# Patient Record
Sex: Male | Born: 1955 | Race: White | Hispanic: No | Marital: Married | Smoking: Former smoker
Health system: Southern US, Community
[De-identification: ages and names within clinical notes are randomized; demographics above are authoritative.]

## PROBLEM LIST (undated history)

## (undated) DIAGNOSIS — Z950 Presence of cardiac pacemaker: Secondary | ICD-10-CM

## (undated) DIAGNOSIS — D6851 Activated protein C resistance: Secondary | ICD-10-CM

## (undated) DIAGNOSIS — F329 Major depressive disorder, single episode, unspecified: Secondary | ICD-10-CM

## (undated) DIAGNOSIS — I071 Rheumatic tricuspid insufficiency: Secondary | ICD-10-CM

## (undated) DIAGNOSIS — K219 Gastro-esophageal reflux disease without esophagitis: Secondary | ICD-10-CM

## (undated) DIAGNOSIS — I2510 Atherosclerotic heart disease of native coronary artery without angina pectoris: Secondary | ICD-10-CM

## (undated) DIAGNOSIS — K5900 Constipation, unspecified: Secondary | ICD-10-CM

## (undated) DIAGNOSIS — F32A Depression, unspecified: Secondary | ICD-10-CM

## (undated) DIAGNOSIS — R0600 Dyspnea, unspecified: Secondary | ICD-10-CM

## (undated) DIAGNOSIS — G4730 Sleep apnea, unspecified: Secondary | ICD-10-CM

## (undated) DIAGNOSIS — F419 Anxiety disorder, unspecified: Secondary | ICD-10-CM

## (undated) DIAGNOSIS — I10 Essential (primary) hypertension: Secondary | ICD-10-CM

## (undated) DIAGNOSIS — R911 Solitary pulmonary nodule: Secondary | ICD-10-CM

## (undated) DIAGNOSIS — M4802 Spinal stenosis, cervical region: Secondary | ICD-10-CM

## (undated) DIAGNOSIS — I509 Heart failure, unspecified: Secondary | ICD-10-CM

## (undated) DIAGNOSIS — I739 Peripheral vascular disease, unspecified: Secondary | ICD-10-CM

## (undated) DIAGNOSIS — I499 Cardiac arrhythmia, unspecified: Secondary | ICD-10-CM

## (undated) DIAGNOSIS — I219 Acute myocardial infarction, unspecified: Secondary | ICD-10-CM

## (undated) DIAGNOSIS — J449 Chronic obstructive pulmonary disease, unspecified: Secondary | ICD-10-CM

## (undated) DIAGNOSIS — I340 Nonrheumatic mitral (valve) insufficiency: Secondary | ICD-10-CM

## (undated) DIAGNOSIS — Z95810 Presence of automatic (implantable) cardiac defibrillator: Secondary | ICD-10-CM

## (undated) DIAGNOSIS — J439 Emphysema, unspecified: Secondary | ICD-10-CM

## (undated) DIAGNOSIS — M1990 Unspecified osteoarthritis, unspecified site: Secondary | ICD-10-CM

## (undated) DIAGNOSIS — I4891 Unspecified atrial fibrillation: Secondary | ICD-10-CM

## (undated) DIAGNOSIS — Z9981 Dependence on supplemental oxygen: Secondary | ICD-10-CM

## (undated) HISTORY — PX: CATARACT EXTRACTION, BILATERAL: SHX1313

## (undated) HISTORY — PX: BELOW KNEE LEG AMPUTATION: SUR23

## (undated) HISTORY — PX: TONSILLECTOMY: SUR1361

## (undated) HISTORY — PX: INSERT / REPLACE / REMOVE PACEMAKER: SUR710

## (undated) HISTORY — PX: ABOVE KNEE LEG AMPUTATION: SUR20

---

## 2002-05-25 DIAGNOSIS — I219 Acute myocardial infarction, unspecified: Secondary | ICD-10-CM

## 2002-05-25 HISTORY — DX: Acute myocardial infarction, unspecified: I21.9

## 2003-05-02 ENCOUNTER — Other Ambulatory Visit: Payer: Self-pay

## 2003-05-26 HISTORY — PX: CORONARY ANGIOPLASTY: SHX604

## 2005-10-15 ENCOUNTER — Ambulatory Visit: Payer: Self-pay | Admitting: Gastroenterology

## 2005-10-19 ENCOUNTER — Ambulatory Visit: Payer: Self-pay

## 2006-03-12 ENCOUNTER — Ambulatory Visit: Payer: Self-pay | Admitting: Internal Medicine

## 2006-11-11 ENCOUNTER — Ambulatory Visit: Payer: Self-pay | Admitting: Internal Medicine

## 2009-03-03 ENCOUNTER — Emergency Department: Payer: Self-pay | Admitting: Emergency Medicine

## 2009-10-02 ENCOUNTER — Emergency Department: Payer: Self-pay | Admitting: Emergency Medicine

## 2009-12-25 ENCOUNTER — Ambulatory Visit: Payer: Self-pay | Admitting: Internal Medicine

## 2011-06-12 DIAGNOSIS — Z95810 Presence of automatic (implantable) cardiac defibrillator: Secondary | ICD-10-CM | POA: Insufficient documentation

## 2011-06-12 DIAGNOSIS — D6851 Activated protein C resistance: Secondary | ICD-10-CM | POA: Insufficient documentation

## 2011-06-14 ENCOUNTER — Emergency Department: Payer: Self-pay | Admitting: Emergency Medicine

## 2011-06-14 LAB — COMPREHENSIVE METABOLIC PANEL
Alkaline Phosphatase: 64 U/L (ref 50–136)
Bilirubin,Total: 0.7 mg/dL (ref 0.2–1.0)
Calcium, Total: 9.2 mg/dL (ref 8.5–10.1)
Chloride: 98 mmol/L (ref 98–107)
Co2: 29 mmol/L (ref 21–32)
Creatinine: 0.79 mg/dL (ref 0.60–1.30)
EGFR (African American): >60
EGFR (Non-African Amer.): >60
Glucose: 88 mg/dL (ref 65–99)
SGOT(AST): 25 U/L (ref 15–37)
SGPT (ALT): 26 U/L
Total Protein: 7.8 g/dL (ref 6.4–8.2)

## 2011-06-14 LAB — CBC
HCT: 47.3 % (ref 40.0–52.0)
MCHC: 33.6 g/dL (ref 32.0–36.0)
MCV: 97 fL (ref 80–100)
Platelet: 195 10*3/uL (ref 150–440)
RBC: 4.88 10*6/uL (ref 4.40–5.90)
RDW: 14.4 % (ref 11.5–14.5)
WBC: 7.8 10*3/uL (ref 3.8–10.6)

## 2011-06-14 LAB — TROPONIN I: Troponin-I: <0.02 ng/mL

## 2011-08-13 ENCOUNTER — Ambulatory Visit: Payer: Self-pay | Admitting: Gastroenterology

## 2013-09-25 ENCOUNTER — Ambulatory Visit: Payer: Self-pay | Admitting: Internal Medicine

## 2013-12-23 ENCOUNTER — Ambulatory Visit: Payer: Self-pay | Admitting: Internal Medicine

## 2014-01-04 ENCOUNTER — Ambulatory Visit: Payer: Self-pay | Admitting: Oncology

## 2014-01-12 ENCOUNTER — Ambulatory Visit: Payer: Self-pay | Admitting: Oncology

## 2014-01-12 LAB — COMPREHENSIVE METABOLIC PANEL
ALK PHOS: 70 U/L
ANION GAP: 7 (ref 7–16)
Albumin: 4.2 g/dL (ref 3.4–5.0)
BUN: 7 mg/dL (ref 7–18)
Bilirubin,Total: 0.5 mg/dL (ref 0.2–1.0)
CHLORIDE: 96 mmol/L — AB (ref 98–107)
CO2: 31 mmol/L (ref 21–32)
Calcium, Total: 9.2 mg/dL (ref 8.5–10.1)
Creatinine: 0.92 mg/dL (ref 0.60–1.30)
EGFR (African American): >60
GLUCOSE: 97 mg/dL (ref 65–99)
OSMOLALITY: 266 (ref 275–301)
POTASSIUM: 4 mmol/L (ref 3.5–5.1)
SGOT(AST): 15 U/L (ref 15–37)
SGPT (ALT): 22 U/L
SODIUM: 134 mmol/L — AB (ref 136–145)
TOTAL PROTEIN: 7.3 g/dL (ref 6.4–8.2)

## 2014-01-12 LAB — CBC CANCER CENTER
Basophil #: 0.1 x10 3/mm (ref 0.0–0.1)
Basophil %: 1 %
Eosinophil #: 0.2 x10 3/mm (ref 0.0–0.7)
Eosinophil %: 2.2 %
HCT: 39.1 % — ABNORMAL LOW (ref 40.0–52.0)
HGB: 13.2 g/dL (ref 13.0–18.0)
Lymphocyte #: 1.2 x10 3/mm (ref 1.0–3.6)
Lymphocyte %: 16.8 %
MCH: 31.1 pg (ref 26.0–34.0)
MCHC: 33.7 g/dL (ref 32.0–36.0)
MCV: 92 fL (ref 80–100)
Monocyte #: 0.5 x10 3/mm (ref 0.2–1.0)
Monocyte %: 7.9 %
NEUTROS PCT: 72.1 %
Neutrophil #: 5.1 x10 3/mm (ref 1.4–6.5)
Platelet: 239 x10 3/mm (ref 150–440)
RBC: 4.24 10*6/uL — ABNORMAL LOW (ref 4.40–5.90)
RDW: 14 % (ref 11.5–14.5)
WBC: 7 x10 3/mm (ref 3.8–10.6)

## 2014-01-12 LAB — LACTATE DEHYDROGENASE: LDH: 122 U/L (ref 85–241)

## 2014-01-12 LAB — TSH: Thyroid Stimulating Horm: 0.717 u[IU]/mL

## 2014-01-15 LAB — PSA: PSA: 0.6 ng/mL (ref 0.0–4.0)

## 2014-01-15 LAB — CEA: CEA: 2.4 ng/mL

## 2014-01-23 ENCOUNTER — Ambulatory Visit: Payer: Self-pay | Admitting: Internal Medicine

## 2014-01-23 ENCOUNTER — Ambulatory Visit: Payer: Self-pay | Admitting: Oncology

## 2014-02-22 ENCOUNTER — Ambulatory Visit: Payer: Self-pay | Admitting: Gastroenterology

## 2014-02-27 LAB — PATHOLOGY REPORT

## 2014-07-23 DIAGNOSIS — E782 Mixed hyperlipidemia: Secondary | ICD-10-CM | POA: Insufficient documentation

## 2014-08-01 DIAGNOSIS — I70219 Atherosclerosis of native arteries of extremities with intermittent claudication, unspecified extremity: Secondary | ICD-10-CM | POA: Insufficient documentation

## 2014-08-01 DIAGNOSIS — I071 Rheumatic tricuspid insufficiency: Secondary | ICD-10-CM | POA: Insufficient documentation

## 2014-08-01 DIAGNOSIS — I5022 Chronic systolic (congestive) heart failure: Secondary | ICD-10-CM | POA: Diagnosis present

## 2014-09-24 ENCOUNTER — Other Ambulatory Visit (HOSPITAL_COMMUNITY): Payer: Self-pay | Admitting: Interventional Radiology

## 2014-09-24 ENCOUNTER — Other Ambulatory Visit: Payer: Self-pay | Admitting: Vascular Surgery

## 2014-09-24 ENCOUNTER — Ambulatory Visit
Admission: RE | Admit: 2014-09-24 | Discharge: 2014-09-24 | Disposition: A | Discharge status: Home / Self Care | Payer: 59 | Source: Ambulatory Visit | Attending: Vascular Surgery | Admitting: Vascular Surgery

## 2014-09-24 DIAGNOSIS — Z89611 Acquired absence of right leg above knee: Secondary | ICD-10-CM | POA: Insufficient documentation

## 2014-09-24 DIAGNOSIS — Z87891 Personal history of nicotine dependence: Secondary | ICD-10-CM | POA: Diagnosis not present

## 2014-09-24 DIAGNOSIS — I2510 Atherosclerotic heart disease of native coronary artery without angina pectoris: Secondary | ICD-10-CM | POA: Diagnosis not present

## 2014-09-24 DIAGNOSIS — I499 Cardiac arrhythmia, unspecified: Secondary | ICD-10-CM | POA: Diagnosis not present

## 2014-09-24 DIAGNOSIS — I252 Old myocardial infarction: Secondary | ICD-10-CM | POA: Diagnosis not present

## 2014-09-24 DIAGNOSIS — I70212 Atherosclerosis of native arteries of extremities with intermittent claudication, left leg: Secondary | ICD-10-CM | POA: Insufficient documentation

## 2014-09-24 DIAGNOSIS — Z89619 Acquired absence of unspecified leg above knee: Secondary | ICD-10-CM | POA: Insufficient documentation

## 2014-09-24 DIAGNOSIS — I509 Heart failure, unspecified: Secondary | ICD-10-CM | POA: Insufficient documentation

## 2014-09-24 DIAGNOSIS — Z86718 Personal history of other venous thrombosis and embolism: Secondary | ICD-10-CM | POA: Insufficient documentation

## 2014-09-24 DIAGNOSIS — I10 Essential (primary) hypertension: Secondary | ICD-10-CM | POA: Diagnosis not present

## 2014-09-24 DIAGNOSIS — I70209 Unspecified atherosclerosis of native arteries of extremities, unspecified extremity: Secondary | ICD-10-CM

## 2014-09-24 DIAGNOSIS — Z79899 Other long term (current) drug therapy: Secondary | ICD-10-CM | POA: Diagnosis not present

## 2014-09-24 HISTORY — DX: Presence of cardiac pacemaker: Z95.0

## 2014-09-24 HISTORY — DX: Presence of automatic (implantable) cardiac defibrillator: Z95.810

## 2014-09-24 HISTORY — DX: Acute myocardial infarction, unspecified: I21.9

## 2014-09-24 HISTORY — DX: Essential (primary) hypertension: I10

## 2014-09-24 HISTORY — DX: Chronic obstructive pulmonary disease, unspecified: J44.9

## 2014-09-24 HISTORY — DX: Peripheral vascular disease, unspecified: I73.9

## 2014-09-24 HISTORY — DX: Atherosclerotic heart disease of native coronary artery without angina pectoris: I25.10

## 2014-09-24 LAB — CREATININE, SERUM
Creatinine, Ser: 0.77 mg/dL (ref 0.61–1.24)
GFR calc Af Amer: >60 mL/min (ref >60)
GFR calc non Af Amer: >60 mL/min (ref >60)

## 2014-09-24 LAB — PROTIME-INR
INR: 1.24
Prothrombin Time: 15.8 seconds — ABNORMAL HIGH (ref 11.4–15.0)

## 2014-09-24 LAB — BUN: BUN: 9 mg/dL (ref 6–20)

## 2014-09-24 MED ORDER — CEFAZOLIN SODIUM 1-5 GM-% IV SOLN
1.0000 g | Freq: Once | INTRAVENOUS | Status: AC
  Administered 2014-09-24: 1 g via INTRAVENOUS
  Filled 2014-09-24: qty 50

## 2014-09-24 MED ORDER — IOHEXOL 300 MG/ML  SOLN
INTRAMUSCULAR | Status: DC | PRN
  Administered 2014-09-24: 30 mL via INTRA_ARTERIAL

## 2014-09-24 MED ORDER — HEPARIN SODIUM (PORCINE) 1000 UNIT/ML IJ SOLN
INTRAMUSCULAR | Status: AC
Start: 2014-09-24 — End: 2014-09-24
  Filled 2014-09-24: qty 1

## 2014-09-24 MED ORDER — HYDROMORPHONE HCL 1 MG/ML IJ SOLN: 1.0000 mg | Freq: Once | INTRAMUSCULAR | Status: AC | PRN

## 2014-09-24 MED ORDER — HEPARIN SODIUM (PORCINE) 1000 UNIT/ML IJ SOLN
INTRAMUSCULAR | Status: DC | PRN
  Administered 2014-09-24: 4000 [IU] via INTRAVENOUS

## 2014-09-24 MED ORDER — FENTANYL CITRATE (PF) 100 MCG/2ML IJ SOLN
INTRAMUSCULAR | Status: AC
  Filled 2014-09-24: qty 2

## 2014-09-24 MED ORDER — ONDANSETRON HCL 4 MG/2ML IJ SOLN
4.0000 mg | Freq: Once | INTRAMUSCULAR | Status: AC | PRN
Start: 2014-09-24 — End: 2014-09-24
  Filled 2014-09-24: qty 2

## 2014-09-24 MED ORDER — IOHEXOL 300 MG/ML  SOLN
INTRAMUSCULAR | Status: DC | PRN
  Administered 2014-09-24: 75 mL via INTRA_ARTERIAL

## 2014-09-24 MED ORDER — CEFAZOLIN SODIUM 1-5 GM-% IV SOLN
INTRAVENOUS | Status: AC
Start: 2014-09-24 — End: 2014-09-24
  Filled 2014-09-24: qty 50

## 2014-09-24 MED ORDER — IOHEXOL 300 MG/ML  SOLN: INTRAMUSCULAR | Status: DC | PRN

## 2014-09-24 MED ORDER — SODIUM CHLORIDE 0.9 % IV SOLN: INTRAVENOUS | Status: DC

## 2014-09-24 MED ORDER — ACETAMINOPHEN 500 MG PO TABS
ORAL_TABLET | ORAL | Status: AC
  Filled 2014-09-24: qty 2

## 2014-09-24 MED ORDER — ATROPINE SULFATE 0.1 MG/ML IJ SOLN
1.0000 mg | Freq: Once | INTRAMUSCULAR | Status: DC
  Filled 2014-09-24: qty 10

## 2014-09-24 MED ORDER — LIDOCAINE-EPINEPHRINE (PF) 1 %-1:200000 IJ SOLN
INTRAMUSCULAR | Status: AC
  Filled 2014-09-24: qty 30

## 2014-09-24 MED ORDER — HEPARIN (PORCINE) IN NACL 2-0.9 UNIT/ML-% IJ SOLN
INTRAMUSCULAR | Status: AC
  Filled 2014-09-24: qty 1000

## 2014-09-24 MED ORDER — MIDAZOLAM HCL 2 MG/2ML IJ SOLN
INTRAMUSCULAR | Status: DC | PRN
Start: 2014-09-24 — End: 2014-09-24
  Administered 2014-09-24 (×2): 1 mg via INTRAVENOUS
  Administered 2014-09-24: 2 mg via INTRAVENOUS
  Administered 2014-09-24: 1 mg via INTRAVENOUS

## 2014-09-24 MED ORDER — MIDAZOLAM HCL 5 MG/5ML IJ SOLN
INTRAMUSCULAR | Status: AC
  Filled 2014-09-24: qty 5

## 2014-09-24 MED ORDER — SODIUM CHLORIDE 0.9 % IV SOLN
INTRAVENOUS | Status: DC
  Administered 2014-09-24: 12:00:00 via INTRAVENOUS

## 2014-09-24 NOTE — Discharge Instructions (Signed)
Angiogram, Care After Refer to this sheet in the next few weeks. These instructions provide you with information on caring for yourself after your procedure. Your health care provider may also give you more specific instructions. Your treatment has been planned according to current medical practices, but problems sometimes occur. Call your health care provider if you have any problems or questions after your procedure.  WHAT TO EXPECT AFTER THE PROCEDURE After your procedure, it is typical to have the following sensations:  Minor discomfort or tenderness and a small bump at the catheter insertion site. The bump should usually decrease in size and tenderness within 1 to 2 weeks.  Any bruising will usually fade within 2 to 4 weeks. HOME CARE INSTRUCTIONS   You may need to keep taking blood thinners if they were prescribed for you. Take medicines only as directed by your health care provider.  Do not apply powder or lotion to the site.  Do not take baths, swim, or use a hot tub until your health care provider approves.  You may shower 24 hours after the procedure. Remove the bandage (dressing) and gently wash the site with plain soap and water. Gently pat the site dry.  Inspect the site at least twice daily.  Limit your activity for the first 48 hours. Do not bend, squat, or lift anything over 20 lb (9 kg) or as directed by your health care provider.  Plan to have someone take you home after the procedure. Follow instructions about when you can drive or return to work. SEEK MEDICAL CARE IF:  You get light-headed when standing up.  You have drainage (other than a small amount of blood on the dressing).  You have chills.  You have a fever.  You have redness, warmth, swelling, or pain at the insertion site. SEEK IMMEDIATE MEDICAL CARE IF:   You develop chest pain or shortness of breath, feel faint, or pass out.  You have bleeding, swelling larger than a walnut, or drainage from the  catheter insertion site.  You develop pain, discoloration, coldness, or severe bruising in the leg or arm that held the catheter.  You develop bleeding from any other place, such as the bowels. You may see bright red blood in your urine or stools, or your stools may appear black and tarry.  You have heavy bleeding from the site. If this happens, hold pressure on the site. MAKE SURE YOU:  Understand these instructions.  Will watch your condition.  Will get help right away if you are not doing well or get worse. Document Released: 11/27/2004 Document Revised: 09/25/2013 Document Reviewed: 10/03/2012 [facility] Patient Information 2015 [place], [place]. This information is not intended to replace advice given to you by your health care provider. Make sure you discuss any questions you have with your health care provider. Groin Insertion Instructions-If you lose feeling or develop tingling or pain in your leg or foot after the procedure, please walk around first.  If the discomfort does not improve , contact your physician and proceed to the nearest emergency room.  Loss of feeling in your leg might mean that a blockage has formed in the artery and this can be appropriately treated.  Limit your activity for the next two days after your procedure.  Avoid stooping, bending, heavy lifting or exertion as this may put pressure on the insertion site.  Resume normal activities in 48 hours.  You may shower after 24 hours but avoid excessive warm water and do not scrub  the site.  Remove clear dressing in 48 hours.  If you have had a closure device inserted, do not soak in a tub bath or a hot tub for at least one week.  No driving for 48 hours after discharge.  After the procedure, check the insertion site occasionally.  If any oozing occurs or there is apparent swelling, firm pressure over the site will prevent a bruise from forming.  You can not hurt anything by pressing directly on the site.  The pressure stops  the bleeding by allowing a small clot to form.  If the bleeding continues after the pressure has been applied for more than 15 minutes, call 911 or go to the nearest emergency room.    The x-ray dye causes you to pass a considerate amount of urine.  For this reason, you will be asked to drink plenty of liquids after the procedure to prevent dehydration.  You may resume you regular diet.  Avoid caffeine products.    For pain at the site of your procedure, take non-aspirin medicines such as Tylenol.  Medications: A. Hold Metformin for 48 hours if applicable.  B. Continue taking all your present medications at home unless your doctor prescribes any changes.

## 2014-09-24 NOTE — Op Note (Signed)
NAME:  AZEEZ, Tyler [name] NO.:  [id number]  MEDICAL RECORD NO.:  [id number]  LOCATION:  I                            FACILITY:  [facility]  PHYSICIAN:  Algernon Huxley, MD        DATE OF BIRTH:  06/04/1955  DATE OF PROCEDURE:  09/24/2014 DATE OF DISCHARGE:                              OPERATIVE REPORT   PREOPERATIVE DIAGNOSIS: 1. Peripheral arterial disease with claudication, left lower     extremity. 2. 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 3. Hypertension. 4. Cardiac arrhythmias.  POSTOPERATIVE DIAGNOSIS: 1. Peripheral arterial disease with claudication, left lower     extremity. 2. 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 3. Hypertension. 4. Cardiac arrhythmias.  PROCEDURE PERFORMED: 1. Ultrasound-guidance for vascular access to right common femoral     artery. 2. Catheter placement to left popliteal artery from right femoral     approach. 3. Aortogram and selective left lower extremity angiogram. 4. Percutaneous transluminal angioplasty of right external and common     iliac arteries, with 5 mm diameter Lutonix drug-coated angioplasty     balloon. 5. Percutaneous transluminal angioplasty of left common iliac artery     with a 6 mm diameter angioplasty balloon. 6. Percutaneous transluminal angioplasty of left superficial femoral     artery with 4 mm diameter conventional and 5 mm diameter drug-     coated angioplasty balloons. 7. Self-expanding stent placement to the left superficial femoral     artery, with a 6 mm diameter x 8 cm length self-expanding stent,     for greater than 50% residual stenosis after angioplasty.  SURGEON:  Algernon Huxley, MD  ANESTHESIA:  Local with monitored conscious sedation.  ESTIMATED BLOOD LOSS:  Minimal.  INDICATIONS FOR PROCEDURE:  This is a gentleman with severe peripheral vascular disease.  He has stopped smoking within the past year  or so. He has severe pain in his left foot with minimal activity and is beginning to get the signs of ischemic rest pain.  He has already lost his right leg due to severe peripheral vascular disease.  He is brought in for angiography after noninvasive studies confirmed significant reduction in flow.  Risks and benefits were discussed.  Informed consent was obtained.  DESCRIPTION OF PROCEDURE:  The patient was brought to the vascular suite.  Groins were shaved and prepped and a sterile surgical field was created.  The right femoral artery was visualized and found to be occluded at the femoral head.  It was accessed at the top of the femoral head, under ultrasound guidance without difficulty, with a micropuncture needle.  A micropuncture wire and sheath were then placed and imaging through the micropuncture sheath showed severe stenosis in the right external iliac artery and distal common iliac artery, with multiple areas of greater than 80% stenosis over a 6-8 cm segment.  Up-sized to a 5-French sheath.  A pigtail catheter was placed in the aorta at the L1 level and an AP aortogram was performed.  This showed normal renal arteries bilaterally.  The left common iliac artery had about a  70% stenosis.  The pigtail catheter was then used to cross the aortic bifurcation.  However, it did not track easily due to the significant stenosis.  We eventually exchanged for a Navicross and a CXI catheter. Selective left lower extremity angiogram was performed.  This demonstrated the above-mentioned iliac stenosis.  The common femoral bifurcation had about a 70%-80% stenosis in both the profunda femoris artery and superficial femoral artery at their origins.  The superficial femoral artery then normalized until the mid to distal superficial femoral artery, where there was some diffuse stenosis leading to a short- segment occlusion at Hunter's canal.  The vessel reconstituted in the popliteal artery and  he then had 2-vessel run-off to the foot, with the posterior tibial artery being dominant.  I was able to navigate through the SFA stenosis proximally and the SFA occlusion distally and confirmed intraluminal flow with the CXI catheter and the popliteal artery.  The patient was given 4000 units of intravenous heparin for systemic anticoagulation.  A 6-French __________ sheath would not originally pass through the right iliac stenosis, so we ballooned the right iliac lesion with a 5 mm diameter x 8 cm length Lutonix drug-coated angioplasty balloon.  A tight waist was seen, which resolved with angioplasty. Completion angiogram following this showed some moderate residual stenosis, but now the 6-French sheath would pass.  The left common iliac lesion was then treated with a 6 mm diameter x 4 cm length Lutonix drug- coated angioplasty balloon, with a good angiographic completion result and only about a 10%-15% residual stenosis.  I then turned my attention to the SFA lesion.  The lesion was quite tight and I did not expect the drug-coated balloon to cross this initially.  I predilated the lesion with a 4 mm diameter angioplasty balloon with a high-grade residual stenosis and near-occlusion seen.  I then upsized to the appropriate size of the artery and used a 5 mm diameter x 12 cm length Lutonix drug-coated angioplasty balloon in the distal SFA down to Hunter's canal.  A tight waist was seen, which resolved with angioplasty.  However, after angioplasty, there was still a high-grade residual stenosis, greater than 70%, and so I elected to cover this area with a self-expanding stent and a 6 mm diameter x 8 cm length self-expanding stent was deployed in the distal SFA, post-dilated with a 5 mm balloon, with an excellent angiographic completion result and less than 10% residual stenosis.  At this point, I elected to terminate the procedure.  I elected not to treat the femoral bifurcation lesion  due to concern for damaging the profunda femoris artery, and this would be in an area where a stent would be contraindicated in a patient who was at acceptable risk for femoral endarterectomy.  If the symptoms persist after treating his iliac disease and his distal SFA disease, open surgery would be recommended to treat his femoral bifurcation lesion.  The sheath was removed.  Pressure was held.  Sterile dressing was placed.  The patient tolerated the procedure well and was taken to the recovery room in stable condition.          ______________________________ Algernon Huxley, MD     JSD/MEDQ  D:  09/24/2014  T:  09/24/2014  Job:  [id number]

## 2015-05-20 ENCOUNTER — Emergency Department: Payer: 59

## 2015-05-20 ENCOUNTER — Emergency Department
Admission: EM | Admit: 2015-05-20 | Discharge: 2015-05-20 | Disposition: A | Discharge status: Home / Self Care | Payer: 59 | Attending: Emergency Medicine | Admitting: Emergency Medicine

## 2015-05-20 DIAGNOSIS — Z87891 Personal history of nicotine dependence: Secondary | ICD-10-CM | POA: Insufficient documentation

## 2015-05-20 DIAGNOSIS — L299 Pruritus, unspecified: Secondary | ICD-10-CM | POA: Diagnosis not present

## 2015-05-20 DIAGNOSIS — R21 Rash and other nonspecific skin eruption: Secondary | ICD-10-CM | POA: Diagnosis not present

## 2015-05-20 DIAGNOSIS — Z79899 Other long term (current) drug therapy: Secondary | ICD-10-CM | POA: Insufficient documentation

## 2015-05-20 DIAGNOSIS — J441 Chronic obstructive pulmonary disease with (acute) exacerbation: Secondary | ICD-10-CM | POA: Insufficient documentation

## 2015-05-20 DIAGNOSIS — Z950 Presence of cardiac pacemaker: Secondary | ICD-10-CM | POA: Insufficient documentation

## 2015-05-20 DIAGNOSIS — Z7901 Long term (current) use of anticoagulants: Secondary | ICD-10-CM | POA: Insufficient documentation

## 2015-05-20 DIAGNOSIS — R0602 Shortness of breath: Secondary | ICD-10-CM | POA: Diagnosis present

## 2015-05-20 DIAGNOSIS — I10 Essential (primary) hypertension: Secondary | ICD-10-CM | POA: Insufficient documentation

## 2015-05-20 LAB — TROPONIN I: Troponin I: <0.03 ng/mL (ref <0.031)

## 2015-05-20 LAB — BASIC METABOLIC PANEL
ANION GAP: 8 (ref 5–15)
BUN: 11 mg/dL (ref 6–20)
CO2: 28 mmol/L (ref 22–32)
Calcium: 9.8 mg/dL (ref 8.9–10.3)
Chloride: 98 mmol/L — ABNORMAL LOW (ref 101–111)
Creatinine, Ser: 0.86 mg/dL (ref 0.61–1.24)
GFR calc Af Amer: >60 mL/min (ref >60)
GLUCOSE: 115 mg/dL — AB (ref 65–99)
POTASSIUM: 4 mmol/L (ref 3.5–5.1)
Sodium: 134 mmol/L — ABNORMAL LOW (ref 135–145)

## 2015-05-20 LAB — CBC
HEMATOCRIT: 41.9 % (ref 40.0–52.0)
HEMOGLOBIN: 14.1 g/dL (ref 13.0–18.0)
MCH: 29.7 pg (ref 26.0–34.0)
MCHC: 33.7 g/dL (ref 32.0–36.0)
MCV: 88.2 fL (ref 80.0–100.0)
Platelets: 216 10*3/uL (ref 150–440)
RBC: 4.75 MIL/uL (ref 4.40–5.90)
RDW: 15.2 % — ABNORMAL HIGH (ref 11.5–14.5)
WBC: 7.7 10*3/uL (ref 3.8–10.6)

## 2015-05-20 MED ORDER — ALBUTEROL SULFATE (2.5 MG/3ML) 0.083% IN NEBU
5.0000 mg | INHALATION_SOLUTION | Freq: Once | RESPIRATORY_TRACT | Status: AC
  Administered 2015-05-20: 5 mg via RESPIRATORY_TRACT
  Filled 2015-05-20: qty 6

## 2015-05-20 MED ORDER — PREDNISONE 20 MG PO TABS: 60.0000 mg | ORAL_TABLET | Freq: Every day | ORAL | Status: DC

## 2015-05-20 MED ORDER — IPRATROPIUM-ALBUTEROL 0.5-2.5 (3) MG/3ML IN SOLN
9.0000 mL | Freq: Once | RESPIRATORY_TRACT | Status: AC
  Administered 2015-05-20: 9 mL via RESPIRATORY_TRACT
  Filled 2015-05-20: qty 9

## 2015-05-20 MED ORDER — PREDNISONE 20 MG PO TABS
60.0000 mg | ORAL_TABLET | ORAL | Status: AC
  Administered 2015-05-20: 60 mg via ORAL
  Filled 2015-05-20: qty 3

## 2015-05-20 NOTE — ED Notes (Signed)
Pt with pacemaker/defib. Pt also reports rash to chest for the past 5-6 days.

## 2015-05-20 NOTE — ED Notes (Signed)
Pt reports hx of COPD and started feeling like he could not breath 4 days ago. Pt reports shortness of breath has gotten worse each day. Pt denies pain, reports he just feels like he can not get a good breath.

## 2015-05-20 NOTE — ED Provider Notes (Signed)
[facility] Emergency Department Provider Note  ____________________________________________  Time seen: Approximately 4:02 PM  I have reviewed the triage vital signs and the nursing notes.   HISTORY  Chief Complaint Shortness of Breath    HPI Tyler [name] is a 59 y.o. male with a history which includes stage IV COPD who follows with Dr. Raul Del, peripheral artery disease status post right AKA, biventricular pacemaker, and anticoagulation on warfarin who presents with gradual onset 4-5 days of worsening shortness of breath.  He states that he has been taking his maintenance COPD medications but that his pulmonologist advise him that he should only use his nebulizer if absolutely needed, so he has not used it over the last 5 days.  He reports that he feels like he can not catch his breath and that any amount of exertion makes it significantly worse.  He describes the symptoms as severe.  He is currently getting an albuterol treatment in the emergency department and reports some improvement since the treatment.  He denies headache, fever/chills, chest pain, abdominal pain, nausea/vomiting.  He has not noticed any peripheral edema and his left leg.  He states that he has had COPD exacerbations in the past and this feels kind of like that.  He has made his symptoms better and exertion and exercise makes it worse.  He is also complaining of a pruritic rash on his chest, his back, and his arms which all started about 5-6 days ago.  Actually he states that the rash in the arms has been there "a long time" and was thought to be due to trying Eliquis by his primary care doctor, then Xarelto, then when the rash did not resolve he was put back on warfarin.  The rashes been persistent but the itching red rash on his chest and back are new.  He states that it is not painful, just a mild degree of itchiness.   Past Medical History  Diagnosis Date  . Coronary artery disease   .  Myocardial infarction ([place])   . Hypertension   . AICD (automatic cardioverter/defibrillator) present   . Peripheral vascular disease   . Presence of permanent cardiac pacemaker   . COPD (chronic obstructive pulmonary disease)     There are no active problems to display for this patient.   Past Surgical History  Procedure Laterality Date  . Below knee leg amputation Left   . Insert / replace / remove pacemaker      Current Outpatient Rx  Name  Route  Sig  Dispense  Refill  . albuterol (PROVENTIL HFA;VENTOLIN HFA) 108 (90 BASE) MCG/ACT inhaler   Inhalation   Inhale 2 puffs into the lungs.         Marland Kitchen albuterol-ipratropium (COMBIVENT) 18-103 MCG/ACT inhaler   Inhalation   Inhale 1 puff into the lungs every 4 (four) hours.         . ALPRAZolam (XANAX) 1 MG tablet   Oral   Take 1 mg by mouth at bedtime.         Marland Kitchen amLODipine (NORVASC) 10 MG tablet   Oral   Take 10 mg by mouth daily.         . calcium carbonate (TUMS - DOSED IN MG ELEMENTAL CALCIUM) 500 MG chewable tablet      1 tablet daily. prn         . clotrimazole-betamethasone (LOTRISONE) cream   Topical   Apply 1 application topically 2 (two) times daily.         Marland Kitchen  docusate sodium (COLACE) 100 MG capsule   Oral   Take 100 mg by mouth every other day.         . levalbuterol (XOPENEX) 1.25 MG/3ML nebulizer solution   Nebulization   Take 3 mLs by nebulization every 6 (six) hours as needed for wheezing.         . lovastatin (MEVACOR) 20 MG tablet   Oral   Take 20 mg by mouth at bedtime.         . metoprolol succinate (TOPROL-XL) 100 MG 24 hr tablet   Oral   Take 100 mg by mouth daily. Take with or immediately following a meal.         . omeprazole (PRILOSEC) 20 MG capsule   Oral   Take 20 mg by mouth daily.         . predniSONE (DELTASONE) 20 MG tablet   Oral   Take 3 tablets (60 mg total) by mouth daily.   15 tablet   0   . ramipril (ALTACE) 10 MG capsule   Oral   Take 10 mg by  mouth daily.         Marland Kitchen warfarin (COUMADIN) 6 MG tablet   Oral   Take 6 mg by mouth daily.           Allergies Review of patient's allergies indicates no known allergies.  Family History  Problem Relation Age of Onset  . Cancer Mother   . Cancer Father   . Heart disease Father     Social History Social History  Substance Use Topics  . Smoking status: Former Smoker -- 1.00 packs/day for 42 years    Types: Cigarettes    Quit date: 09/23/2013  . Smokeless tobacco: Not on file  . Alcohol Use: No    Review of Systems Constitutional: No fever/chills Eyes: No visual changes. ENT: No sore throat. Cardiovascular: Denies chest pain. Respiratory: Increasing shortness of breath over 5 days particularly with exertion Gastrointestinal: No abdominal pain.  No nausea, no vomiting.  No diarrhea.  No constipation. Genitourinary: Negative for dysuria. Musculoskeletal: Negative for back pain. Skin: Pruritic rash on his chest and back over the last 4-5 days, chronic rash on his arms particularly the left one Neurological: Negative for headaches, focal weakness or numbness.  10-point ROS otherwise negative.  ____________________________________________   PHYSICAL EXAM:  VITAL SIGNS: ED Triage Vitals  Enc Vitals Group     BP 05/20/15 1426 152/76 mmHg     Pulse Rate 05/20/15 1426 78     Resp 05/20/15 1426 20     Temp 05/20/15 1426 97.5 F (36.4 C)     Temp Source 05/20/15 1426 Oral     SpO2 05/20/15 1426 98 %     Weight 05/20/15 1426 154 lb (69.854 kg)     Height 05/20/15 1426 6\' 2"  (1.88 m)     Head Cir --      Peak Flow --      Pain Score --      Pain Loc --      Pain Edu? --      Excl. in [place]? --     Constitutional: Alert and oriented.  No acute distress, has the appearance of chronic illness.  Mildly cachectic. Eyes: Conjunctivae are normal. PERRL. EOMI. Head: Atraumatic. Nose: No congestion/rhinnorhea. Mouth/Throat: Mucous membranes are moist.  Oropharynx  non-erythematous. Neck: No stridor.   Cardiovascular: Normal rate, regular rhythm. Grossly normal heart sounds.  Good peripheral circulation.  Pacemaker present in left chest. Respiratory: Normal respiratory effort.  No retractions.  Minimal expiratory wheezes in bases. Gastrointestinal: Soft and nontender. No distention. No abdominal bruits. No CVA tenderness. Musculoskeletal: Status post right AKA.  No peripheral edema in the left lower extremity. Neurologic:  Normal speech and language. No gross focal neurologic deficits are appreciated.  Skin:  Skin is warm, dry and intact.  Erythematous maculopapular rash on his back in the T5-T6 range but though it is present primarily on the right side of his back it does cross over the midline to the left.  It is also present on his anterior chest.  It is not tender to the touch and is nonvesicular without any crusting or evidence of surrounding infection. Psychiatric: Mood and affect are normal. Speech and behavior are normal.  ____________________________________________   LABS (all labs ordered are listed, but only abnormal results are displayed)  Labs Reviewed  BASIC METABOLIC PANEL - Abnormal; Notable for the following:    Sodium 134 (*)    Chloride 98 (*)    Glucose, Bld 115 (*)    All other components within normal limits  CBC - Abnormal; Notable for the following:    RDW 15.2 (*)    All other components within normal limits  TROPONIN I  TROPONIN I   ____________________________________________  EKG  ED ECG REPORT I, Dalante Minus, the attending physician, personally viewed and interpreted this ECG.   Date: 05/20/2015  EKG Time: 14:29  Rate: 77  Rhythm: Ventricular paced rhythm (biventricular pacemaker)  Axis: Left axis deviation  Intervals:Normal for biventricular pacemaker  ST&T Change: No evidence of acute ischemia  ____________________________________________  RADIOLOGY   Dg Chest 2 View  05/20/2015  CLINICAL DATA:   COPD EXAM: CHEST  2 VIEW COMPARISON:  06/14/2011 FINDINGS: Cardiomediastinal silhouette is stable. Two leads cardiac pacemaker unchanged in position. No acute infiltrate or pleural effusion. No pulmonary edema. IMPRESSION: No active cardiopulmonary disease. Electronically Signed   By: Lahoma Crocker M.D.   On: 05/20/2015 14:45    ____________________________________________   PROCEDURES  Procedure(s) performed: None  Critical Care performed: No ____________________________________________   INITIAL IMPRESSION / ASSESSMENT AND PLAN / ED COURSE  Pertinent labs & imaging results that were available during my care of the patient were reviewed by me and considered in my medical decision making (see chart for details).  The patient's rash looks most consistent with some sort of contact dermatitis.  It does not appear to be zoster nor a bacterial infectious process.  He is unaware of any specific contacts that he may have had that would cause it.  Leave that his shortness of breath is probably the result of the COPD exacerbation especially because he has advanced COPD and has not been using his nebulizer.  I am encouraging him to use his nebulizer at home.  I am giving him 3 DuoNeb here in the emergency department as well as starting him on prednisone which should also help his rash.   ----------------------------------------- 6:41 PM on 05/20/2015 -----------------------------------------  The patient feels better after his 3 DuoNeb's.  He continues to have no chest pain.  His labs are reassuring and his troponin is negative 2.  I gave my usual and customary return precautions.   He has albuterol inhaler and nebulizer solution at home.  ____________________________________________  FINAL CLINICAL IMPRESSION(S) / ED DIAGNOSES  Final diagnoses:  Acute exacerbation of chronic obstructive pulmonary disease (COPD) (HCC)      NEW MEDICATIONS STARTED DURING THIS  VISIT:  New Prescriptions    PREDNISONE (DELTASONE) 20 MG TABLET    Take 3 tablets (60 mg total) by mouth daily.     Hinda Kehr, MD 05/20/15 [phone]

## 2015-05-20 NOTE — Discharge Instructions (Signed)
We believe that your symptoms are caused today by an exacerbation of your COPD.  Please take the prescribed medications and any medications that you have at home for your COPD including your albuterol nebulizer.  Follow up with your doctor as recommended.  If you develop any new or worsening symptoms, including but not limited to fever, persistent vomiting, worsening shortness of breath, or other symptoms that concern you, please return to the Emergency Department immediately.   Chronic Obstructive Pulmonary Disease Chronic obstructive pulmonary disease (COPD) is a common lung condition in which airflow from the lungs is limited. COPD is a general term that can be used to describe many different lung problems that limit airflow, including both chronic bronchitis and emphysema. If you have COPD, your lung function will probably never return to normal, but there are measures you can take to improve lung function and make yourself feel better.  CAUSES   Smoking (common).   Exposure to secondhand smoke.   Genetic problems.  Chronic inflammatory lung diseases or recurrent infections. SYMPTOMS   Shortness of breath, especially with physical activity.   Deep, persistent (chronic) cough with a large amount of thick mucus.   Wheezing.   Rapid breaths (tachypnea).   Gray or bluish discoloration (cyanosis) of the skin, especially in fingers, toes, or lips.   Fatigue.   Weight loss.   Frequent infections or episodes when breathing symptoms become much worse (exacerbations).   Chest tightness. DIAGNOSIS  Your health care provider will take a medical history and perform a physical examination to make the initial diagnosis. Additional tests for COPD may include:   Lung (pulmonary) function tests.  Chest X-ray.  CT scan.  Blood tests. TREATMENT  Treatment available to help you feel better when you have COPD includes:   Inhaler and nebulizer medicines. These help manage the  symptoms of COPD and make your breathing more comfortable.  Supplemental oxygen. Supplemental oxygen is only helpful if you have a low oxygen level in your blood.   Exercise and physical activity. These are beneficial for nearly all people with COPD. Some people may also benefit from a pulmonary rehabilitation program. HOME CARE INSTRUCTIONS   Take all medicines (inhaled or pills) as directed by your health care provider.  Avoid over-the-counter medicines or cough syrups that dry up your airway (such as antihistamines) and slow down the elimination of secretions unless instructed otherwise by your health care provider.   If you are a smoker, the most important thing that you can do is stop smoking. Continuing to smoke will cause further lung damage and breathing trouble. Ask your health care provider for help with quitting smoking. He or she can direct you to community resources or hospitals that provide support.  Avoid exposure to irritants such as smoke, chemicals, and fumes that aggravate your breathing.  Use oxygen therapy and pulmonary rehabilitation if directed by your health care provider. If you require home oxygen therapy, ask your health care provider whether you should purchase a pulse oximeter to measure your oxygen level at home.   Avoid contact with individuals who have a contagious illness.  Avoid extreme temperature and humidity changes.  Eat healthy foods. Eating smaller, more frequent meals and resting before meals may help you maintain your strength.  Stay active, but balance activity with periods of rest. Exercise and physical activity will help you maintain your ability to do things you want to do.  Preventing infection and hospitalization is very important when you have COPD. Make  sure to receive all the vaccines your health care provider recommends, especially the pneumococcal and influenza vaccines. Ask your health care provider whether you need a pneumonia  vaccine.  Learn and use relaxation techniques to manage stress.  Learn and use controlled breathing techniques as directed by your health care provider. Controlled breathing techniques include:   Pursed lip breathing. Start by breathing in (inhaling) through your nose for 1 second. Then, purse your lips as if you were going to whistle and breathe out (exhale) through the pursed lips for 2 seconds.   Diaphragmatic breathing. Start by putting one hand on your abdomen just above your waist. Inhale slowly through your nose. The hand on your abdomen should move out. Then purse your lips and exhale slowly. You should be able to feel the hand on your abdomen moving in as you exhale.   Learn and use controlled coughing to clear mucus from your lungs. Controlled coughing is a series of short, progressive coughs. The steps of controlled coughing are:  1. Lean your head slightly forward.  2. Breathe in deeply using diaphragmatic breathing.  3. Try to hold your breath for 3 seconds.  4. Keep your mouth slightly open while coughing twice.  5. Spit any mucus out into a tissue.  6. Rest and repeat the steps once or twice as needed. SEEK MEDICAL CARE IF:   You are coughing up more mucus than usual.   There is a change in the color or thickness of your mucus.   Your breathing is more labored than usual.   Your breathing is faster than usual.  SEEK IMMEDIATE MEDICAL CARE IF:   You have shortness of breath while you are resting.   You have shortness of breath that prevents you from:  Being able to talk.   Performing your usual physical activities.   You have chest pain lasting longer than 5 minutes.   Your skin color is more cyanotic than usual.  You measure low oxygen saturations for longer than 5 minutes with a pulse oximeter. MAKE SURE YOU:   Understand these instructions.  Will watch your condition.  Will get help right away if you are not doing well or get  worse. Document Released: 02/18/2005 Document Revised: 09/25/2013 Document Reviewed: 01/05/2013 [facility] Patient Information 2015 [place], [place]. This information is not intended to replace advice given to you by your health care provider. Make sure you discuss any questions you have with your health care provider.

## 2015-08-23 ENCOUNTER — Other Ambulatory Visit: Payer: Self-pay | Admitting: Internal Medicine

## 2015-08-23 DIAGNOSIS — R319 Hematuria, unspecified: Secondary | ICD-10-CM

## 2015-08-30 ENCOUNTER — Ambulatory Visit
Admission: RE | Admit: 2015-08-30 | Discharge: 2015-08-30 | Disposition: A | Discharge status: Home / Self Care | Payer: 59 | Source: Ambulatory Visit | Attending: Internal Medicine | Admitting: Internal Medicine

## 2015-08-30 DIAGNOSIS — R319 Hematuria, unspecified: Secondary | ICD-10-CM | POA: Insufficient documentation

## 2015-12-04 ENCOUNTER — Other Ambulatory Visit: Payer: Self-pay | Admitting: Vascular Surgery

## 2015-12-06 ENCOUNTER — Other Ambulatory Visit
Admission: RE | Admit: 2015-12-06 | Discharge: 2015-12-06 | Disposition: A | Discharge status: Home / Self Care | Payer: 59 | Source: Ambulatory Visit | Attending: Vascular Surgery | Admitting: Vascular Surgery

## 2015-12-06 DIAGNOSIS — I739 Peripheral vascular disease, unspecified: Secondary | ICD-10-CM | POA: Insufficient documentation

## 2015-12-06 LAB — CREATININE, SERUM: Creatinine, Ser: 0.8 mg/dL (ref 0.61–1.24)

## 2015-12-06 LAB — PROTIME-INR
INR: 2.87
Prothrombin Time: 29.6 seconds — ABNORMAL HIGH (ref 11.4–15.0)

## 2015-12-06 LAB — BUN: BUN: 10 mg/dL (ref 6–20)

## 2015-12-09 ENCOUNTER — Ambulatory Visit
Admission: RE | Admit: 2015-12-09 | Discharge: 2015-12-09 | Disposition: A | Discharge status: Home / Self Care | Payer: 59 | Source: Ambulatory Visit | Attending: Vascular Surgery | Admitting: Vascular Surgery

## 2015-12-09 ENCOUNTER — Encounter
Admission: RE | Disposition: A | Discharge status: Home / Self Care | Payer: Self-pay | Source: Ambulatory Visit | Attending: Vascular Surgery

## 2015-12-09 DIAGNOSIS — Z801 Family history of malignant neoplasm of trachea, bronchus and lung: Secondary | ICD-10-CM | POA: Insufficient documentation

## 2015-12-09 DIAGNOSIS — I82409 Acute embolism and thrombosis of unspecified deep veins of unspecified lower extremity: Secondary | ICD-10-CM | POA: Insufficient documentation

## 2015-12-09 DIAGNOSIS — Z7902 Long term (current) use of antithrombotics/antiplatelets: Secondary | ICD-10-CM | POA: Diagnosis not present

## 2015-12-09 DIAGNOSIS — Z8249 Family history of ischemic heart disease and other diseases of the circulatory system: Secondary | ICD-10-CM | POA: Insufficient documentation

## 2015-12-09 DIAGNOSIS — M79609 Pain in unspecified limb: Secondary | ICD-10-CM | POA: Insufficient documentation

## 2015-12-09 DIAGNOSIS — I70212 Atherosclerosis of native arteries of extremities with intermittent claudication, left leg: Secondary | ICD-10-CM | POA: Insufficient documentation

## 2015-12-09 DIAGNOSIS — Z8052 Family history of malignant neoplasm of bladder: Secondary | ICD-10-CM | POA: Insufficient documentation

## 2015-12-09 DIAGNOSIS — Z87891 Personal history of nicotine dependence: Secondary | ICD-10-CM | POA: Diagnosis not present

## 2015-12-09 DIAGNOSIS — F419 Anxiety disorder, unspecified: Secondary | ICD-10-CM | POA: Diagnosis not present

## 2015-12-09 DIAGNOSIS — I509 Heart failure, unspecified: Secondary | ICD-10-CM | POA: Diagnosis not present

## 2015-12-09 DIAGNOSIS — K219 Gastro-esophageal reflux disease without esophagitis: Secondary | ICD-10-CM | POA: Diagnosis not present

## 2015-12-09 DIAGNOSIS — I87092 Postthrombotic syndrome with other complications of left lower extremity: Secondary | ICD-10-CM | POA: Insufficient documentation

## 2015-12-09 DIAGNOSIS — I213 ST elevation (STEMI) myocardial infarction of unspecified site: Secondary | ICD-10-CM | POA: Diagnosis not present

## 2015-12-09 DIAGNOSIS — I499 Cardiac arrhythmia, unspecified: Secondary | ICD-10-CM | POA: Diagnosis not present

## 2015-12-09 DIAGNOSIS — Z89611 Acquired absence of right leg above knee: Secondary | ICD-10-CM | POA: Diagnosis not present

## 2015-12-09 DIAGNOSIS — E785 Hyperlipidemia, unspecified: Secondary | ICD-10-CM | POA: Insufficient documentation

## 2015-12-09 DIAGNOSIS — I110 Hypertensive heart disease with heart failure: Secondary | ICD-10-CM | POA: Diagnosis not present

## 2015-12-09 DIAGNOSIS — M7989 Other specified soft tissue disorders: Secondary | ICD-10-CM | POA: Insufficient documentation

## 2015-12-09 DIAGNOSIS — J449 Chronic obstructive pulmonary disease, unspecified: Secondary | ICD-10-CM | POA: Insufficient documentation

## 2015-12-09 DIAGNOSIS — I2510 Atherosclerotic heart disease of native coronary artery without angina pectoris: Secondary | ICD-10-CM | POA: Insufficient documentation

## 2015-12-09 DIAGNOSIS — R200 Anesthesia of skin: Secondary | ICD-10-CM | POA: Diagnosis not present

## 2015-12-09 DIAGNOSIS — I999 Unspecified disorder of circulatory system: Secondary | ICD-10-CM | POA: Diagnosis not present

## 2015-12-09 HISTORY — PX: PERIPHERAL VASCULAR CATHETERIZATION: SHX172C

## 2015-12-09 LAB — PROTIME-INR
INR: 1.27
PROTHROMBIN TIME: 16 s — AB (ref 11.4–15.0)

## 2015-12-09 SURGERY — LOWER EXTREMITY ANGIOGRAPHY
Anesthesia: Moderate Sedation | Laterality: Left

## 2015-12-09 MED ORDER — MIDAZOLAM HCL 5 MG/5ML IJ SOLN
INTRAMUSCULAR | Status: AC
  Filled 2015-12-09: qty 5

## 2015-12-09 MED ORDER — FENTANYL CITRATE (PF) 100 MCG/2ML IJ SOLN
INTRAMUSCULAR | Status: DC | PRN
  Administered 2015-12-09: 50 ug via INTRAVENOUS

## 2015-12-09 MED ORDER — FAMOTIDINE 20 MG PO TABS: 40.0000 mg | ORAL_TABLET | ORAL | Status: DC | PRN

## 2015-12-09 MED ORDER — HYDROMORPHONE HCL 1 MG/ML IJ SOLN
1.0000 mg | Freq: Once | INTRAMUSCULAR | Status: AC
  Administered 2015-12-09: 1 mg via INTRAVENOUS

## 2015-12-09 MED ORDER — HYDROMORPHONE HCL 1 MG/ML IJ SOLN
INTRAMUSCULAR | Status: AC
  Filled 2015-12-09: qty 1

## 2015-12-09 MED ORDER — FENTANYL CITRATE (PF) 100 MCG/2ML IJ SOLN
INTRAMUSCULAR | Status: AC
  Filled 2015-12-09: qty 2

## 2015-12-09 MED ORDER — ONDANSETRON HCL 4 MG/2ML IJ SOLN
4.0000 mg | Freq: Four times a day (QID) | INTRAMUSCULAR | Status: DC | PRN
  Administered 2015-12-09: 4 mg via INTRAVENOUS

## 2015-12-09 MED ORDER — MIDAZOLAM HCL 2 MG/2ML IJ SOLN
INTRAMUSCULAR | Status: DC | PRN
  Administered 2015-12-09: 2 mg via INTRAVENOUS

## 2015-12-09 MED ORDER — DEXTROSE 5 % IV SOLN: 1.5000 g | INTRAVENOUS | Status: DC

## 2015-12-09 MED ORDER — ONDANSETRON HCL 4 MG/2ML IJ SOLN
INTRAMUSCULAR | Status: AC
  Filled 2015-12-09: qty 2

## 2015-12-09 MED ORDER — METHYLPREDNISOLONE SODIUM SUCC 125 MG IJ SOLR: 125.0000 mg | INTRAMUSCULAR | Status: DC | PRN

## 2015-12-09 MED ORDER — HEPARIN (PORCINE) IN NACL 2-0.9 UNIT/ML-% IJ SOLN
INTRAMUSCULAR | Status: AC
  Filled 2015-12-09: qty 1000

## 2015-12-09 MED ORDER — LIDOCAINE-EPINEPHRINE (PF) 1 %-1:200000 IJ SOLN
INTRAMUSCULAR | Status: AC
  Filled 2015-12-09: qty 30

## 2015-12-09 MED ORDER — HEPARIN SODIUM (PORCINE) 1000 UNIT/ML IJ SOLN
INTRAMUSCULAR | Status: AC
  Filled 2015-12-09: qty 1

## 2015-12-09 MED ORDER — HEPARIN SODIUM (PORCINE) 1000 UNIT/ML IJ SOLN
INTRAMUSCULAR | Status: DC | PRN
  Administered 2015-12-09: 2000 [IU] via INTRAVENOUS
  Administered 2015-12-09: 2500 [IU] via INTRAVENOUS

## 2015-12-09 MED ORDER — ASPIRIN EC 81 MG PO TBEC: 81.0000 mg | DELAYED_RELEASE_TABLET | Freq: Every day | ORAL | Status: DC

## 2015-12-09 MED ORDER — IOPAMIDOL (ISOVUE-300) INJECTION 61%
INTRAVENOUS | Status: DC | PRN
  Administered 2015-12-09: 50 mL via INTRA_ARTERIAL

## 2015-12-09 MED ORDER — SODIUM CHLORIDE 0.9 % IV SOLN
INTRAVENOUS | Status: DC
  Administered 2015-12-09 (×2): via INTRAVENOUS

## 2015-12-09 SURGICAL SUPPLY — 16 items
BALLN LUTONIX 5X150X130 (BALLOONS) ×4
BALLN ULTRVRSE 3X150X150 (BALLOONS) ×4
BALLOON LUTONIX 5X150X130 (BALLOONS) ×2 IMPLANT
BALLOON ULTRVRSE 3X150X150 (BALLOONS) ×2 IMPLANT
CANNULA 5F STIFF (CANNULA) ×4 IMPLANT
CATH PIG 70CM (CATHETERS) ×4 IMPLANT
CATH TORCON 5FR 0.38 (CATHETERS) ×4 IMPLANT
DEVICE PRESTO INFLATION (MISCELLANEOUS) ×4 IMPLANT
GLIDEWIRE STIFF .35X180X3 HYDR (WIRE) ×4 IMPLANT
PACK ANGIOGRAPHY (CUSTOM PROCEDURE TRAY) ×4 IMPLANT
SHEATH BRITE TIP 5FRX11 (SHEATH) ×8 IMPLANT
SHIELD RADPAD SCOOP 12X17 (MISCELLANEOUS) ×4 IMPLANT
SYR MEDRAD MARK V 150ML (SYRINGE) ×4 IMPLANT
TUBING CONTRAST HIGH PRESS 72 (TUBING) ×4 IMPLANT
WIRE G V18X300CM (WIRE) ×4 IMPLANT
WIRE J 3MM .035X145CM (WIRE) ×4 IMPLANT

## 2015-12-09 NOTE — H&P (Signed)
  [facility] History & Physical Update  The patient was interviewed and re-examined.  The patient's previous History and Physical has been reviewed and is unchanged.  There is no change in the plan of care. We plan to proceed with the scheduled procedure.  Candra Wegner, MD  12/09/2015, 11:05 AM

## 2015-12-09 NOTE — Discharge Instructions (Signed)

## 2015-12-09 NOTE — Op Note (Signed)
[facility] Percutaneous Study/Intervention Procedural Note   Date of Surgery: 12/09/2015  Surgeon(s):Rowena Moilanen   Assistants:none  Pre-operative Diagnosis: PAD with claudication LLE  Post-operative diagnosis: Same  Procedure(s) Performed: 1. Ultrasound guidance for vascular access right femoral artery in a retrograde approach and the left femoral artery from an antegrade approach 2. Catheter placement into aorta from right femoral approach and into left posterior tibial artery from left femoral approach 3. Aortogram and selective left lower extremity angiogram 4. Percutaneous transluminal angioplasty of tibioperoneal trunk and proximal posterior tibial artery with 3 mm diameter by 15 cm length angioplasty balloon 5. Percutaneous transluminal angioplasty of the distal SFA and above-knee popliteal artery with 5 mm diameter by 15 cm length Lutonix drug-coated angioplasty    EBL: 25 cc  Contrast: 50 cc  Fluoro Time: 6 minutes  Moderate Conscious Sedation Time: approximately 40 minutes using 2 mg of Versed and 50 mcg of Fentanyl  Indications: Patient is a 60 y.o.male with worsening left lower extremity claudication. 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 The patient has noninvasive study showing multiple areas of stenosis in the left lower extremity. The patient is brought in for angiography for further evaluation and potential treatment. Risks and benefits are discussed and informed consent is obtained  Procedure: The patient was identified and appropriate procedural time out was performed. The patient was then placed supine on the table and prepped and draped in the usual sterile fashion.Moderate conscious sedation was administered during a face to face encounter with the patient throughout the procedure with my supervision of the RN administering medicines and  monitoring the patient's vital signs, pulse oximetry, telemetry and mental status throughout from the start of the procedure until the patient was taken to the recovery room. Ultrasound was used to evaluate the right common femoral artery. It was occluded . A digital ultrasound image was acquired. A micropuncture needle was used to access the right common femoral artery under direct ultrasound guidance and a permanent image was performed. Using a Glidewire was able to navigate through the common femoral and iliac artery but remained in the subintimal plane. A 5 French sheath was placed and using a Kumpe catheter and a Glidewire advanced into the aorta but could never regain intraluminal flow remaining in the subintimal space even in the aorta. I quickly abandoned this approach and elected to perform an antegrade stick on the left. Ultrasound was used to access the left common femoral artery in an antegrade approach under direct ultrasound guidance without difficulty and a permanent image was recorded. A wire was navigated into the SFA and a 5 French sheath was placed. Selective left lower extremity angiogram was then performed. This demonstrated 2 areas of stenosis in the distal SFA and above-knee popliteal artery below the previous stent in the 70-80% range in close proximity. The posterior tibial artery was the best runoff to the foot, but there was a 75-80% stenosis in the tibioperoneal trunk and proximal posterior tibial artery. The patient was systemically heparinized. I then used a Kumpe catheter and the advantage wire to navigate through the SFA and popliteal stenosis and then into the TP trunk.  I then exchanged for a 0.018 wire and crossed the TP trunk stenosis and proximal PT artery stenosis and into the foot.  I then began with treatment.  I used a 3 mm diameter x 15 cm balloon to treat the PT artery and TP trunk.  The balloon was inflated to 8 atm  for one minute. Angiogram showed only about a 20-25%  residual stenosis in the TP trunk.  I then used a 5 mm diameter x 15 cm length Lutonix drug coated angioplasty balloon for the distal SFA and AK popliteal artery.  This was inflated to 10 atm for one minute.  Completion angiogram showed about a 20% residual stenosis. I elected to terminate the procedure. The sheath was removed on each side and pressure was held.  Hemostasis was complete.   Findings:  Aortogram: unable to reenter true lumen from subintimal right femoral approach Left Lower Extremity: 2 areas of stenosis in the distal SFA and above-knee popliteal artery below the previous stent in the 70-80% range in close proximity. The posterior tibial artery was the best runoff to the foot, but there was a 75-80% stenosis in the tibioperoneal trunk and proximal posterior tibial artery.   Disposition: Patient was taken to the recovery room in stable condition having tolerated the procedure well.  Complications: None  Cheryllynn Sarff 12/09/2015 12:26 PM

## 2015-12-09 NOTE — Progress Notes (Signed)
Pt clinically stable post procedure, had sm. Ooze appearing track like, held pressure, no bleeding nor hematoma at site at this time, keeping pt flat til 1600, wife present, given zofran for nausea, no emesis, discharge teaching done with questions answered

## 2015-12-10 ENCOUNTER — Encounter: Payer: Self-pay | Admitting: Vascular Surgery

## 2016-05-13 ENCOUNTER — Telehealth (INDEPENDENT_AMBULATORY_CARE_PROVIDER_SITE_OTHER): Payer: Self-pay | Admitting: Vascular Surgery

## 2016-05-13 NOTE — Telephone Encounter (Signed)
Pt had angio in July and is now having redness in toes and pain in calf. Please advise pt.    [phone]

## 2016-05-14 ENCOUNTER — Other Ambulatory Visit (INDEPENDENT_AMBULATORY_CARE_PROVIDER_SITE_OTHER): Payer: Self-pay | Admitting: Vascular Surgery

## 2016-05-14 DIAGNOSIS — I739 Peripheral vascular disease, unspecified: Secondary | ICD-10-CM

## 2016-05-15 ENCOUNTER — Encounter (INDEPENDENT_AMBULATORY_CARE_PROVIDER_SITE_OTHER): Payer: Self-pay | Admitting: Vascular Surgery

## 2016-05-15 ENCOUNTER — Ambulatory Visit (INDEPENDENT_AMBULATORY_CARE_PROVIDER_SITE_OTHER): Payer: 59 | Admitting: Vascular Surgery

## 2016-05-15 ENCOUNTER — Ambulatory Visit (INDEPENDENT_AMBULATORY_CARE_PROVIDER_SITE_OTHER): Payer: 59

## 2016-05-15 VITALS — BP 132/73 | HR 85 | Resp 16 | Wt 155.0 lb

## 2016-05-15 DIAGNOSIS — Z89611 Acquired absence of right leg above knee: Secondary | ICD-10-CM | POA: Diagnosis not present

## 2016-05-15 DIAGNOSIS — I739 Peripheral vascular disease, unspecified: Secondary | ICD-10-CM

## 2016-05-15 DIAGNOSIS — I10 Essential (primary) hypertension: Secondary | ICD-10-CM

## 2016-05-15 NOTE — Assessment & Plan Note (Signed)
Stable and well healed

## 2016-05-15 NOTE — Assessment & Plan Note (Signed)
Arterial studies are done today for further evaluation. He has a normal right ABI 1.1 and a normal digital pressure of over 100 on the left with good waveforms consistent with no severe arterial insufficiency. It does not appear as if his current symptoms are related to arterial insufficiency. Continue elevation and compression stockings as needed. Discussed the pathophysiology and natural history of venous disease and how it differed from arterial disease. Recheck in 6 months with noninvasive studies.

## 2016-05-15 NOTE — Progress Notes (Signed)
MRN : [id number]  Tyler [name] is a 60 y.o. ([birth year]) male who presents with chief complaint of  Chief Complaint  Patient presents with  . Follow-up  .  History of Present Illness: Patient returns today in follow up of Pain and tingling as well as discoloration in his left foot. Given the fact that he is artery status post right AKA, he is very concerned about this. He has a previous history of 2 interventions on the left leg previously. Arterial studies are done today for further evaluation. He has a normal right ABI 1.1 and a normal digital pressure of over 100 on the left with good waveforms consistent with no severe arterial insufficiency.  Current Outpatient Prescriptions  Medication Sig Dispense Refill  . albuterol-ipratropium (COMBIVENT) 18-103 MCG/ACT inhaler Inhale 1 puff into the lungs every 4 (four) hours.    . ALPRAZolam (XANAX) 1 MG tablet Take 1 mg by mouth at bedtime.    Marland Kitchen aspirin EC 81 MG tablet Take 1 tablet (81 mg total) by mouth daily. 150 tablet 2  . calcium carbonate (TUMS - DOSED IN MG ELEMENTAL CALCIUM) 500 MG chewable tablet 1 tablet daily. prn    . docusate sodium (COLACE) 100 MG capsule Take 100 mg by mouth every other day.    . fluticasone-salmeterol (ADVAIR HFA) 115-21 MCG/ACT inhaler USE 2 INHALATIONS ORALLY   EVERY 12 HOURS    . levalbuterol (XOPENEX) 1.25 MG/3ML nebulizer solution Take 3 mLs by nebulization every 6 (six) hours as needed for wheezing.    . lovastatin (MEVACOR) 20 MG tablet Take 20 mg by mouth at bedtime.    . metoprolol succinate (TOPROL-XL) 100 MG 24 hr tablet Take 100 mg by mouth daily. Take with or immediately following a meal.    . omeprazole (PRILOSEC) 20 MG capsule Take 20 mg by mouth daily.    . predniSONE (DELTASONE) 20 MG tablet Take 3 tablets (60 mg total) by mouth daily. 15 tablet 0  . ramipril (ALTACE) 10 MG capsule Take 10 mg by mouth daily.    Marland Kitchen SPIRIVA RESPIMAT 1.25 MCG/ACT AERS     . warfarin (COUMADIN) 6 MG tablet  Take 6 mg by mouth daily.    Marland Kitchen albuterol (PROVENTIL HFA;VENTOLIN HFA) 108 (90 BASE) MCG/ACT inhaler Inhale 2 puffs into the lungs.    Marland Kitchen amLODipine (NORVASC) 10 MG tablet Take 10 mg by mouth daily.    . clotrimazole-betamethasone (LOTRISONE) cream Apply 1 application topically 2 (two) times daily.     No current facility-administered medications for this visit.     Past Medical History:  Diagnosis Date  . AICD (automatic cardioverter/defibrillator) present   . COPD (chronic obstructive pulmonary disease) ([place])   . Coronary artery disease   . Hypertension   . Myocardial infarction   . Peripheral vascular disease ([place])   . Presence of permanent cardiac pacemaker     Past Surgical History:  Procedure Laterality Date  . BELOW KNEE LEG AMPUTATION Left   . INSERT / REPLACE / REMOVE PACEMAKER    . PERIPHERAL VASCULAR CATHETERIZATION Left 12/09/2015   Procedure: Lower Extremity Angiography;  Surgeon: Algernon Huxley, MD;  Location: [facility];  Service: Cardiovascular;  Laterality: Left;  . PERIPHERAL VASCULAR CATHETERIZATION  12/09/2015   Procedure: Lower Extremity Intervention;  Surgeon: Algernon Huxley, MD;  Location: [facility];  Service: Cardiovascular;;    Social History Social History  Substance Use Topics  . Smoking status: Former Smoker  Packs/day: 1.00    Years: 42.00    Types: Cigarettes    Quit date: 09/23/2013  . Smokeless tobacco: Not on file  . Alcohol use No    Family History Family History  Problem Relation Age of Onset  . Cancer Mother   . Cancer Father   . Heart disease Father     No Known Allergies   REVIEW OF SYSTEMS (Negative unless checked)  Constitutional: [] Weight loss  [] Fever  [] Chills Cardiac: [] Chest pain   [] Chest pressure   [] Palpitations   [] Shortness of breath when laying flat   [] Shortness of breath at rest   [] Shortness of breath with exertion. Vascular:  [] Pain in legs with walking   [x] Pain in legs at rest   [] Pain in legs  when laying flat   [] Claudication   [] Pain in feet when walking  [] Pain in feet at rest  [] Pain in feet when laying flat   [] History of DVT   [] Phlebitis   [x] Swelling in legs   [x] Varicose veins   [] Non-healing ulcers Pulmonary:   [] Uses home oxygen   [] Productive cough   [] Hemoptysis   [] Wheeze  [] COPD   [] Asthma Neurologic:  [] Dizziness  [] Blackouts   [] Seizures   [] History of stroke   [] History of TIA  [] Aphasia   [] Temporary blindness   [] Dysphagia   [] Weakness or numbness in arms   [] Weakness or numbness in legs Musculoskeletal:  [] Arthritis   [] Joint swelling   [] Joint pain   [] Low back pain Hematologic:  [] Easy bruising  [] Easy bleeding   [] Hypercoagulable state   [] Anemic   Gastrointestinal:  [] Blood in stool   [] Vomiting blood  [] Gastroesophageal reflux/heartburn   [] Abdominal pain Genitourinary:  [] Chronic kidney disease   [] Difficult urination  [] Frequent urination  [] Burning with urination   [] Hematuria Skin:  [] Rashes   [] Ulcers   [] Wounds Psychological:  [] History of anxiety   []  History of major depression.  Physical Examination  BP 132/73   Pulse 85   Resp 16   Wt 70.3 kg (155 lb)   BMI 19.90 kg/m  Gen:  WD/WN, NAD Head: /AT, No temporalis wasting. Ear/Nose/Throat: Hearing grossly intact, nares w/o erythema or drainage, trachea midline Eyes: Conjunctiva clear. Sclera non-icteric Neck: Supple.  No JVD.  Pulmonary:  Good air movement, no use of accessory muscles.  Cardiac: RRR, normal S1, S2 Vascular:  Vessel Right Left  Radial Palpable Palpable  Ulnar Palpable Palpable  Brachial Palpable Palpable  Carotid Palpable, without bruit Palpable, without bruit  Aorta Not palpable N/A  Femoral Not Palpable Palpable  Popliteal Not Palpable Palpable  PT Not Palpable Palpable  DP Not Palpable 1+ Palpable   Gastrointestinal: soft, non-tender/non-distended. No guarding/reflex.  Musculoskeletal: Right AKA. Left foot with purplish discoloration, prominent varicosities, and  stasis dermatitis changes. No ulceration or infection Neurologic: Sensation grossly intact in extremities.  Symmetrical.  Speech is fluent.  Psychiatric: Judgment intact, Mood & affect appropriate for pt's clinical situation. Dermatologic: No rashes or ulcers noted.  No cellulitis or open wounds. Lymph : No Cervical, Axillary, or Inguinal lymphadenopathy.      Labs No results found for this or any previous visit (from the past 2160 hour(s)).  Radiology No results found.    Assessment/Plan  No problem-specific Assessment & Plan notes found for this encounter.    Leotis Pain, MD  05/15/2016 12:25 PM    This note was created with Dragon medical transcription system.  Any errors from dictation are purely unintentional

## 2016-05-15 NOTE — Assessment & Plan Note (Signed)
blood pressure control important in reducing the progression of atherosclerotic disease. On appropriate oral medications.  

## 2016-07-10 ENCOUNTER — Encounter (INDEPENDENT_AMBULATORY_CARE_PROVIDER_SITE_OTHER): Payer: Self-pay

## 2016-07-10 ENCOUNTER — Ambulatory Visit (INDEPENDENT_AMBULATORY_CARE_PROVIDER_SITE_OTHER): Payer: Self-pay | Admitting: Vascular Surgery

## 2016-10-13 ENCOUNTER — Inpatient Hospital Stay
Admission: EM | Admit: 2016-10-13 | Discharge: 2016-10-23 | DRG: 190 | Disposition: A | Discharge status: Home Health | Payer: 59 | Attending: Internal Medicine | Admitting: Internal Medicine

## 2016-10-13 ENCOUNTER — Encounter: Payer: Self-pay | Admitting: Emergency Medicine

## 2016-10-13 ENCOUNTER — Emergency Department: Payer: 59

## 2016-10-13 DIAGNOSIS — E222 Syndrome of inappropriate secretion of antidiuretic hormone: Secondary | ICD-10-CM | POA: Diagnosis present

## 2016-10-13 DIAGNOSIS — I252 Old myocardial infarction: Secondary | ICD-10-CM

## 2016-10-13 DIAGNOSIS — I429 Cardiomyopathy, unspecified: Secondary | ICD-10-CM | POA: Diagnosis present

## 2016-10-13 DIAGNOSIS — E785 Hyperlipidemia, unspecified: Secondary | ICD-10-CM | POA: Diagnosis present

## 2016-10-13 DIAGNOSIS — J9601 Acute respiratory failure with hypoxia: Secondary | ICD-10-CM

## 2016-10-13 DIAGNOSIS — J441 Chronic obstructive pulmonary disease with (acute) exacerbation: Secondary | ICD-10-CM

## 2016-10-13 DIAGNOSIS — J9690 Respiratory failure, unspecified, unspecified whether with hypoxia or hypercapnia: Secondary | ICD-10-CM

## 2016-10-13 DIAGNOSIS — I482 Chronic atrial fibrillation: Secondary | ICD-10-CM | POA: Diagnosis present

## 2016-10-13 DIAGNOSIS — I110 Hypertensive heart disease with heart failure: Secondary | ICD-10-CM | POA: Diagnosis present

## 2016-10-13 DIAGNOSIS — Z7982 Long term (current) use of aspirin: Secondary | ICD-10-CM | POA: Diagnosis not present

## 2016-10-13 DIAGNOSIS — I5022 Chronic systolic (congestive) heart failure: Secondary | ICD-10-CM | POA: Diagnosis present

## 2016-10-13 DIAGNOSIS — F419 Anxiety disorder, unspecified: Secondary | ICD-10-CM | POA: Diagnosis present

## 2016-10-13 DIAGNOSIS — Z7901 Long term (current) use of anticoagulants: Secondary | ICD-10-CM | POA: Diagnosis not present

## 2016-10-13 DIAGNOSIS — Z89611 Acquired absence of right leg above knee: Secondary | ICD-10-CM

## 2016-10-13 DIAGNOSIS — K5900 Constipation, unspecified: Secondary | ICD-10-CM

## 2016-10-13 DIAGNOSIS — Z79899 Other long term (current) drug therapy: Secondary | ICD-10-CM | POA: Diagnosis not present

## 2016-10-13 DIAGNOSIS — J9621 Acute and chronic respiratory failure with hypoxia: Secondary | ICD-10-CM | POA: Diagnosis present

## 2016-10-13 DIAGNOSIS — Z87891 Personal history of nicotine dependence: Secondary | ICD-10-CM

## 2016-10-13 DIAGNOSIS — I739 Peripheral vascular disease, unspecified: Secondary | ICD-10-CM | POA: Diagnosis present

## 2016-10-13 DIAGNOSIS — Z7951 Long term (current) use of inhaled steroids: Secondary | ICD-10-CM | POA: Diagnosis not present

## 2016-10-13 DIAGNOSIS — E872 Acidosis: Secondary | ICD-10-CM | POA: Diagnosis present

## 2016-10-13 DIAGNOSIS — J9600 Acute respiratory failure, unspecified whether with hypoxia or hypercapnia: Secondary | ICD-10-CM

## 2016-10-13 DIAGNOSIS — J411 Mucopurulent chronic bronchitis: Secondary | ICD-10-CM | POA: Diagnosis not present

## 2016-10-13 DIAGNOSIS — J9602 Acute respiratory failure with hypercapnia: Secondary | ICD-10-CM

## 2016-10-13 DIAGNOSIS — Z95810 Presence of automatic (implantable) cardiac defibrillator: Secondary | ICD-10-CM | POA: Diagnosis not present

## 2016-10-13 DIAGNOSIS — J9622 Acute and chronic respiratory failure with hypercapnia: Secondary | ICD-10-CM | POA: Diagnosis present

## 2016-10-13 DIAGNOSIS — Z8249 Family history of ischemic heart disease and other diseases of the circulatory system: Secondary | ICD-10-CM

## 2016-10-13 DIAGNOSIS — I2510 Atherosclerotic heart disease of native coronary artery without angina pectoris: Secondary | ICD-10-CM | POA: Diagnosis present

## 2016-10-13 DIAGNOSIS — Z23 Encounter for immunization: Secondary | ICD-10-CM

## 2016-10-13 DIAGNOSIS — I480 Paroxysmal atrial fibrillation: Secondary | ICD-10-CM | POA: Diagnosis present

## 2016-10-13 DIAGNOSIS — I509 Heart failure, unspecified: Secondary | ICD-10-CM

## 2016-10-13 DIAGNOSIS — I10 Essential (primary) hypertension: Secondary | ICD-10-CM | POA: Diagnosis present

## 2016-10-13 HISTORY — DX: Unspecified atrial fibrillation: I48.91

## 2016-10-13 HISTORY — DX: Heart failure, unspecified: I50.9

## 2016-10-13 HISTORY — DX: Chronic obstructive pulmonary disease with (acute) exacerbation: J44.1

## 2016-10-13 LAB — COMPREHENSIVE METABOLIC PANEL
ALBUMIN: 4.4 g/dL (ref 3.5–5.0)
ALK PHOS: 89 U/L (ref 38–126)
ALT: 25 U/L (ref 17–63)
AST: 43 U/L — ABNORMAL HIGH (ref 15–41)
Anion gap: 10 (ref 5–15)
BILIRUBIN TOTAL: 1 mg/dL (ref 0.3–1.2)
BUN: 14 mg/dL (ref 6–20)
CO2: 26 mmol/L (ref 22–32)
CREATININE: 1.07 mg/dL (ref 0.61–1.24)
Calcium: 9.3 mg/dL (ref 8.9–10.3)
Chloride: 94 mmol/L — ABNORMAL LOW (ref 101–111)
GFR calc Af Amer: >60 mL/min (ref >60)
Glucose, Bld: 154 mg/dL — ABNORMAL HIGH (ref 65–99)
POTASSIUM: 4.2 mmol/L (ref 3.5–5.1)
Sodium: 130 mmol/L — ABNORMAL LOW (ref 135–145)
TOTAL PROTEIN: 7.6 g/dL (ref 6.5–8.1)

## 2016-10-13 LAB — CBC WITH DIFFERENTIAL/PLATELET
Basophils Absolute: 0.1 10*3/uL (ref 0–0.1)
Basophils Relative: 1 %
Eosinophils Absolute: 0.1 10*3/uL (ref 0–0.7)
Eosinophils Relative: 1 %
HEMATOCRIT: 44.8 % (ref 40.0–52.0)
HEMOGLOBIN: 14.8 g/dL (ref 13.0–18.0)
LYMPHS ABS: 1.8 10*3/uL (ref 1.0–3.6)
Lymphocytes Relative: 16 %
MCH: 29 pg (ref 26.0–34.0)
MCHC: 33.1 g/dL (ref 32.0–36.0)
MCV: 87.8 fL (ref 80.0–100.0)
MONOS PCT: 13 %
Monocytes Absolute: 1.4 10*3/uL — ABNORMAL HIGH (ref 0.2–1.0)
NEUTROS ABS: 7.9 10*3/uL — AB (ref 1.4–6.5)
NEUTROS PCT: 69 %
Platelets: 298 10*3/uL (ref 150–440)
RBC: 5.11 MIL/uL (ref 4.40–5.90)
RDW: 14 % (ref 11.5–14.5)
WBC: 11.3 10*3/uL — ABNORMAL HIGH (ref 3.8–10.6)

## 2016-10-13 LAB — TROPONIN I: Troponin I: <0.03 ng/mL (ref <0.03)

## 2016-10-13 LAB — LACTIC ACID, PLASMA
Lactic Acid, Venous: 3 mmol/L (ref 0.5–1.9)
Lactic Acid, Venous: 1.4 mmol/L (ref 0.5–1.9)

## 2016-10-13 LAB — BRAIN NATRIURETIC PEPTIDE: B NATRIURETIC PEPTIDE 5: 321 pg/mL — AB (ref 0.0–100.0)

## 2016-10-13 LAB — MAGNESIUM: Magnesium: 1.8 mg/dL (ref 1.7–2.4)

## 2016-10-13 MED ORDER — VANCOMYCIN HCL IN DEXTROSE 1-5 GM/200ML-% IV SOLN
1000.0000 mg | Freq: Two times a day (BID) | INTRAVENOUS | Status: DC
  Administered 2016-10-14: 1000 mg via INTRAVENOUS
  Filled 2016-10-13 (×2): qty 200

## 2016-10-13 MED ORDER — DEXTROSE 5 % IV SOLN
2.0000 g | Freq: Three times a day (TID) | INTRAVENOUS | Status: DC
  Administered 2016-10-14: 2 g via INTRAVENOUS
  Filled 2016-10-13 (×4): qty 2

## 2016-10-13 MED ORDER — IPRATROPIUM-ALBUTEROL 0.5-2.5 (3) MG/3ML IN SOLN
3.0000 mL | Freq: Once | RESPIRATORY_TRACT | Status: AC
  Administered 2016-10-13: 3 mL via RESPIRATORY_TRACT

## 2016-10-13 MED ORDER — VANCOMYCIN HCL IN DEXTROSE 1-5 GM/200ML-% IV SOLN
1000.0000 mg | Freq: Once | INTRAVENOUS | Status: AC
  Administered 2016-10-13: 1000 mg via INTRAVENOUS
  Filled 2016-10-13: qty 200

## 2016-10-13 MED ORDER — IPRATROPIUM-ALBUTEROL 0.5-2.5 (3) MG/3ML IN SOLN
RESPIRATORY_TRACT | Status: AC
  Administered 2016-10-13: 3 mL via RESPIRATORY_TRACT
  Filled 2016-10-13: qty 6

## 2016-10-13 MED ORDER — MAGNESIUM SULFATE 2 GM/50ML IV SOLN
2.0000 g | Freq: Once | INTRAVENOUS | Status: AC
  Administered 2016-10-13: 2 g via INTRAVENOUS
  Filled 2016-10-13: qty 50

## 2016-10-13 MED ORDER — DEXTROSE 5 % IV SOLN
2.0000 g | Freq: Once | INTRAVENOUS | Status: AC
  Administered 2016-10-13: 2 g via INTRAVENOUS
  Filled 2016-10-13: qty 2

## 2016-10-13 NOTE — ED Provider Notes (Signed)
[facility] Emergency Department Provider Note    First MD Initiated Contact with Patient 10/13/16 1956     (approximate)  I have reviewed the triage vital signs and the nursing notes.   HISTORY  Chief Complaint Respiratory Distress  Level V Caveat:  Acute respiratory distress HPI Tyler [name] is a 61 y.o. male history of COPD and systolic heart failure presents with acute respiratory distress. Patient reportedly having worsening shortness of breath over the past several days. Was seen in outpatient clinic yesterday was started on azithromycin. Was then again seen and started on prednisone. Throughout the day has had worsening labored breathing. No chest pain or diaphoresis. States he just can't catch his breath. EMS found the patient was O2 saturations in the low to mid 80s. Was placed on CPAP and given 2 albuterol treatments, 1 DuoNeb and Solu-Medrol. Patient with minimal improvement. Arrives to the ER in respiratory distress.   Past Medical History:  Diagnosis Date  . AICD (automatic cardioverter/defibrillator) present   . Atrial fibrillation ([place])   . CHF (congestive heart failure) ([place])   . COPD (chronic obstructive pulmonary disease) ([place])   . Coronary artery disease   . Hypertension   . Myocardial infarction ([place])   . Peripheral vascular disease ([place])   . Presence of permanent cardiac pacemaker    Family History  Problem Relation Age of Onset  . Cancer Mother   . Cancer Father   . Heart disease Father    Past Surgical History:  Procedure Laterality Date  . BELOW KNEE LEG AMPUTATION Left   . INSERT / REPLACE / REMOVE PACEMAKER    . PERIPHERAL VASCULAR CATHETERIZATION Left 12/09/2015   Procedure: Lower Extremity Angiography;  Surgeon: Algernon Huxley, MD;  Location: [place] [facility];  Service: Cardiovascular;  Laterality: Left;  . PERIPHERAL VASCULAR CATHETERIZATION  12/09/2015   Procedure: Lower Extremity Intervention;  Surgeon: Algernon Huxley, MD;  Location: [facility];  Service: Cardiovascular;;   Patient Active Problem List   Diagnosis Date Noted  . COPD with acute exacerbation ([place]) 10/13/2016  . Acute respiratory failure with hypoxia and hypercapnia ([place]) 10/13/2016  . Chronic systolic CHF (congestive heart failure) ([place]) 10/13/2016  . AF (paroxysmal atrial fibrillation) ([place]) 10/13/2016  . Hx of AKA (above knee amputation), right ([place]) 05/15/2016  . Essential hypertension, benign 05/15/2016  . PVD (peripheral vascular disease) ([place]) 05/15/2016      Prior to Admission medications   Medication Sig Start Date End Date Taking? Authorizing Provider  albuterol (PROVENTIL HFA;VENTOLIN HFA) 108 (90 BASE) MCG/ACT inhaler Inhale 2 puffs into the lungs.   Yes [provider]  albuterol-ipratropium (COMBIVENT) 18-103 MCG/ACT inhaler Inhale 1 puff into the lungs every 4 (four) hours.   Yes [provider]  ALPRAZolam Duanne Moron) 1 MG tablet Take 1 mg by mouth at bedtime.   Yes [provider]  aspirin EC 81 MG tablet Take 1 tablet (81 mg total) by mouth daily. 12/09/15  Yes [name], Erskine Squibb, MD  calcium carbonate (TUMS - DOSED IN MG ELEMENTAL CALCIUM) 500 MG chewable tablet 1 tablet daily. prn   Yes [provider]  clotrimazole-betamethasone (LOTRISONE) cream Apply 1 application topically 2 (two) times daily.   Yes [provider]  docusate sodium (COLACE) 100 MG capsule Take 100 mg by mouth every other day.   Yes [provider]  fluticasone-salmeterol (ADVAIR HFA) 115-21 MCG/ACT inhaler USE 2 INHALATIONS ORALLY   EVERY 12 HOURS  04/06/16  Yes [provider]  levalbuterol (XOPENEX) 1.25 MG/3ML nebulizer solution Take 3 mLs by nebulization every 6 (six) hours as needed for wheezing.   Yes [provider]  lovastatin (MEVACOR) 20 MG tablet Take 20 mg by mouth at bedtime.   Yes [provider]  metoprolol succinate (TOPROL-XL) 100 MG 24 hr tablet Take  100 mg by mouth daily. Take with or immediately following a meal.   Yes [provider]  omeprazole (PRILOSEC) 20 MG capsule Take 20 mg by mouth daily.   Yes [provider]  ramipril (ALTACE) 10 MG capsule Take 10 mg by mouth daily.   Yes [provider]  SPIRIVA RESPIMAT 1.25 MCG/ACT AERS Inhale 2 puffs into the lungs daily.  04/09/16  Yes [provider]  warfarin (COUMADIN) 5 MG tablet Take 5 mg by mouth one time only at 6 PM.    Yes [provider]  predniSONE (DELTASONE) 20 MG tablet Take 3 tablets (60 mg total) by mouth daily. Patient not taking: Reported on 10/13/2016 05/20/15   Hinda Kehr, MD    Allergies Patient has no known allergies.    Social History Social History  Substance Use Topics  . Smoking status: Former Smoker    Packs/day: 1.00    Years: 42.00    Types: Cigarettes    Quit date: 09/23/2013  . Smokeless tobacco: Never Used  . Alcohol use No    Review of Systems Patient denies headaches, rhinorrhea, blurry vision, numbness, shortness of breath, chest pain, edema, cough, abdominal pain, nausea, vomiting, diarrhea, dysuria, fevers, rashes or hallucinations unless otherwise stated above in HPI. ____________________________________________   PHYSICAL EXAM:  VITAL SIGNS: Vitals:   10/13/16 2245 10/13/16 2300  BP: 109/60 (!) 116/93  Pulse: 74   Resp: (!) 22 19  Temp:      Constitutional: Critically ill appearing, tripoding and in acute respiratory distress Eyes: Conjunctivae are normal. PERRL. EOMI. Head: Atraumatic. Nose: No congestion/rhinnorhea. Mouth/Throat: Mucous membranes are moist.  Oropharynx non-erythematous. Neck: No stridor. Painless ROM. No cervical spine tenderness to palpation Hematological/Lymphatic/Immunilogical: No cervical lymphadenopathy. Cardiovascular: tachycardic rate, regular rhythm. Grossly normal heart sounds.  Good peripheral circulation. Respiratory: tachypnea, diminished  breathsounds throughout with faint end expiratory wheeze. Gastrointestinal: Soft and nontender. No distention. No abdominal bruits. No CVA tenderness.  Musculoskeletal: sp right AKA. No lower extremity tenderness nor edema.  No joint effusions. Neurologic:   No gross focal neurologic deficits are appreciated Skin:  Skin is warm, dry and intact. No rash noted. Psychiatric: Mood and affect are normal. Speech and behavior are normal.  ____________________________________________   LABS (all labs ordered are listed, but only abnormal results are displayed)  Results for orders placed or performed during the hospital encounter of 10/13/16 (from the past 24 hour(s))  Lactic acid, plasma     Status: Abnormal   Collection Time: 10/13/16  7:58 PM  Result Value Ref Range   Lactic Acid, Venous 3.0 (HH) 0.5 - 1.9 mmol/L  Troponin I     Status: None   Collection Time: 10/13/16  7:58 PM  Result Value Ref Range   Troponin I <0.03 <0.03 ng/mL  CBC WITH DIFFERENTIAL     Status: Abnormal   Collection Time: 10/13/16  7:58 PM  Result Value Ref Range   WBC 11.3 (H) 3.8 - 10.6 K/uL   RBC 5.11 4.40 - 5.90 MIL/uL   Hemoglobin 14.8 13.0 - 18.0 g/dL   HCT 44.8 40.0 - 52.0 %   MCV  87.8 80.0 - 100.0 fL   MCH 29.0 26.0 - 34.0 pg   MCHC 33.1 32.0 - 36.0 g/dL   RDW 14.0 11.5 - 14.5 %   Platelets 298 150 - 440 K/uL   Neutrophils Relative % 69 %   Neutro Abs 7.9 (H) 1.4 - 6.5 K/uL   Lymphocytes Relative 16 %   Lymphs Abs 1.8 1.0 - 3.6 K/uL   Monocytes Relative 13 %   Monocytes Absolute 1.4 (H) 0.2 - 1.0 K/uL   Eosinophils Relative 1 %   Eosinophils Absolute 0.1 0 - 0.7 K/uL   Basophils Relative 1 %   Basophils Absolute 0.1 0 - 0.1 K/uL  Comprehensive metabolic panel     Status: Abnormal   Collection Time: 10/13/16  7:58 PM  Result Value Ref Range   Sodium 130 (L) 135 - 145 mmol/L   Potassium 4.2 3.5 - 5.1 mmol/L   Chloride 94 (L) 101 - 111 mmol/L   CO2 26 22 - 32 mmol/L   Glucose, Bld 154 (H) 65 -  99 mg/dL   BUN 14 6 - 20 mg/dL   Creatinine, Ser 1.07 0.61 - 1.24 mg/dL   Calcium 9.3 8.9 - 10.3 mg/dL   Total Protein 7.6 6.5 - 8.1 g/dL   Albumin 4.4 3.5 - 5.0 g/dL   AST 43 (H) 15 - 41 U/L   ALT 25 17 - 63 U/L   Alkaline Phosphatase 89 38 - 126 U/L   Total Bilirubin 1.0 0.3 - 1.2 mg/dL   GFR calc non Af Amer >60 >60 mL/min   GFR calc Af Amer >60 >60 mL/min   Anion gap 10 5 - 15  Brain natriuretic peptide     Status: Abnormal   Collection Time: 10/13/16  7:58 PM  Result Value Ref Range   B Natriuretic Peptide 321.0 (H) 0.0 - 100.0 pg/mL  Magnesium     Status: None   Collection Time: 10/13/16  7:58 PM  Result Value Ref Range   Magnesium 1.8 1.7 - 2.4 mg/dL  Blood gas, venous ([facility], [facility], [facility])     Status: Abnormal (Preliminary result)   Collection Time: 10/13/16  7:59 PM  Result Value Ref Range   Delivery systems BILEVEL POSITIVE AIRWAY PRESSURE    pH, Ven 7.19 (LL) 7.250 - 7.430   pCO2, Ven 66 (H) 44.0 - 60.0 mmHg   pO2, Ven <31.0 (LL) 32.0 - 45.0 mmHg   Bicarbonate 25.2 20.0 - 28.0 mmol/L   Acid-base deficit 4.4 (H) 0.0 - 2.0 mmol/L   O2 Saturation PENDING %   Patient temperature 37.0    Collection site VENOUS    Sample type VENOUS    ____________________________________________  EKG My review and personal interpretation at Time: 20:03   Indication: sob  Rate: 70  Rhythm: v paced Axis: left Other: no sgarbossa criterioa ____________________________________________  RADIOLOGY  I personally reviewed all radiographic images ordered to evaluate for the above acute complaints and reviewed radiology reports and findings.  These findings were personally discussed with the patient.  Please see medical record for radiology report.  ____________________________________________   PROCEDURES  Procedure(s) performed:  Procedures    Critical Care performed: yes CRITICAL CARE Performed by: Merlyn Lot   Total critical care time: 40 minutes  Critical care time was  exclusive of separately billable procedures and treating other patients.  Critical care was necessary to treat or prevent imminent or life-threatening deterioration.  Critical care was time spent personally by me on the following  activities: development of treatment plan with patient and/or surrogate as well as nursing, discussions with consultants, evaluation of patient's response to treatment, examination of patient, obtaining history from patient or surrogate, ordering and performing treatments and interventions, ordering and review of laboratory studies, ordering and review of radiographic studies, pulse oximetry and re-evaluation of patient's condition.  ____________________________________________   INITIAL IMPRESSION / ASSESSMENT AND PLAN / ED COURSE  Pertinent labs & imaging results that were available during my care of the patient were reviewed by me and considered in my medical decision making (see chart for details).  DDX: Asthma, copd, CHF, pna, ptx, malignancy, Pe, anemia   Tyler [name] is a 61 y.o. who presents to the ED with acute respiratory distress as described above. Patient placed immediately on BiPAP being given multiple nebulizer treatments including IV magnesium. Chest x-ray she just more COPD exacerbation rather than congestive heart failure. After observation with serial treatments and reassessments the patient's respirations did steadily improve on BiPAP. Do not feel this is secondary to a pulmonary embolism. Blood gas to show evidence of respiratory acidosis further supporting COPD exacerbation. Patient given broad-spectrum antibiotics as he was recently started on a Zithromax as an outpatient. Patient will be admitted to the hospitalist service for further evaluation and management.  Have discussed with the patient and available family all diagnostics and treatments performed thus far and all questions were answered to the best of my ability. The patient demonstrates  understanding and agreement with plan.       ____________________________________________   FINAL CLINICAL IMPRESSION(S) / ED DIAGNOSES  Final diagnoses:  Acute respiratory failure with hypoxia and hypercapnia (HCC)  COPD exacerbation (HCC)  Congestive heart failure, unspecified HF chronicity, unspecified heart failure type ([place])      NEW MEDICATIONS STARTED DURING THIS VISIT:  New Prescriptions   No medications on file     Note:  This document was prepared using Dragon voice recognition software and may include unintentional dictation errors.    Merlyn Lot, MD 10/13/16 [phone]

## 2016-10-13 NOTE — ED Triage Notes (Signed)
Pt to ED from home respiratory distress via EMS emergency traffic.  EMS states sats mid 80s, CO2 of 30, placed on cpap, given 2 albuterol, 1 duoneb, 125mg  solumedrol.  Pt received 60mg  prednisone shot at PCP today.  Pt presents tachypnea, labored breathing, wheezing right side, diminished lung sounds throughout.  Hx COPD and CHF.  Pt placed on bi-pap immediately, EDP at bedside upon arrival.

## 2016-10-13 NOTE — Progress Notes (Signed)
Pharmacy Antibiotic Note  Tyler [name] is a 61 y.o. male admitted on 10/13/2016 with pneumonia.  Pharmacy has been consulted for vanc/cefepime dosing.  Plan: Patient received vanc 1g IV x 1 in ED  Will follow up w/ vanc 1g IV q12h w/ 6 hour stack dose. Will draw vanc trough 5/24 @ 1500 prior to 4th dose. Goal trough 15 - 20 mcg/mL Ke 0.0639 T1/2 12 hours  Will start cefepime 2g IV q8h  Height: 6\' 1"  (185.4 cm) Weight: 154 lb (69.9 kg) IBW/kg (Calculated) : 79.9  Temp (24hrs), Avg:98.8 F (37.1 C), Min:98.8 F (37.1 C), Max:98.8 F (37.1 C)   Recent Labs Lab 10/13/16 1958  WBC 11.3*  CREATININE 1.07  LATICACIDVEN 3.0*    Estimated Creatinine Clearance: 71.7 mL/min (by C-G formula based on SCr of 1.07 mg/dL).    No Known Allergies   Thank you for allowing pharmacy to be a part of this patient's care.  Tobie Lords, PharmD, BCPS Clinical Pharmacist 10/13/2016

## 2016-10-13 NOTE — H&P (Signed)
[facility] NAME: Tyler [name]    MR#:  [id number]  DATE OF BIRTH:  [birth year]  DATE OF ADMISSION:  10/13/2016  PRIMARY CARE PHYSICIAN: Madelyn Brunner, MD   REQUESTING/REFERRING PHYSICIAN: Quentin Cornwall, MD  CHIEF COMPLAINT:   Chief Complaint  Patient presents with  . Respiratory Distress    HISTORY OF PRESENT ILLNESS:  Tyler [name]  is a 61 y.o. male who presents with 4 days progressive shortness of breath.  He has had congestion recently, but states that today his home inhalers were not improving his symptoms.  He saw his PCP today and got a prednisone shot and azithromycin, but continued to feel worse.  He presented via EMS hypoxic, requiring BiPAP.  Workup consistent with severe COPD exacerbation.  He was hypercapnic on VBG as well, causing some acidosis.  Hospitalists were called for admission after initial management in ED.  PAST MEDICAL HISTORY:   Past Medical History:  Diagnosis Date  . AICD (automatic cardioverter/defibrillator) present   . Atrial fibrillation ([place])   . CHF (congestive heart failure) ([place])   . COPD (chronic obstructive pulmonary disease) ([place])   . Coronary artery disease   . Hypertension   . Myocardial infarction ([place])   . Peripheral vascular disease ([place])   . Presence of permanent cardiac pacemaker     PAST SURGICAL HISTORY:   Past Surgical History:  Procedure Laterality Date  . BELOW KNEE LEG AMPUTATION Left   . INSERT / REPLACE / REMOVE PACEMAKER    . PERIPHERAL VASCULAR CATHETERIZATION Left 12/09/2015   Procedure: Lower Extremity Angiography;  Surgeon: Algernon Huxley, MD;  Location: [facility];  Service: Cardiovascular;  Laterality: Left;  . PERIPHERAL VASCULAR CATHETERIZATION  12/09/2015   Procedure: Lower Extremity Intervention;  Surgeon: Algernon Huxley, MD;  Location: [facility];  Service: Cardiovascular;;    SOCIAL HISTORY:   Social History  Substance Use  Topics  . Smoking status: Former Smoker    Packs/day: 1.00    Years: 42.00    Types: Cigarettes    Quit date: 09/23/2013  . Smokeless tobacco: Never Used  . Alcohol use No    FAMILY HISTORY:   Family History  Problem Relation Age of Onset  . Cancer Mother   . Cancer Father   . Heart disease Father     DRUG ALLERGIES:  No Known Allergies  MEDICATIONS AT HOME:   Prior to Admission medications   Medication Sig Start Date End Date Taking? Authorizing Provider  albuterol (PROVENTIL HFA;VENTOLIN HFA) 108 (90 BASE) MCG/ACT inhaler Inhale 2 puffs into the lungs.   Yes [provider]  albuterol-ipratropium (COMBIVENT) 18-103 MCG/ACT inhaler Inhale 1 puff into the lungs every 4 (four) hours.   Yes [provider]  ALPRAZolam Duanne Moron) 1 MG tablet Take 1 mg by mouth at bedtime.   Yes [provider]  aspirin EC 81 MG tablet Take 1 tablet (81 mg total) by mouth daily. 12/09/15  Yes [name], Erskine Squibb, MD  calcium carbonate (TUMS - DOSED IN MG ELEMENTAL CALCIUM) 500 MG chewable tablet 1 tablet daily. prn   Yes [provider]  clotrimazole-betamethasone (LOTRISONE) cream Apply 1 application topically 2 (two) times daily.   Yes [provider]  docusate sodium (COLACE) 100 MG capsule Take 100 mg by mouth every other day.   Yes [provider]  fluticasone-salmeterol (ADVAIR HFA) 115-21 MCG/ACT inhaler USE 2 INHALATIONS ORALLY  EVERY 12 HOURS 04/06/16  Yes [provider]  levalbuterol (XOPENEX) 1.25 MG/3ML nebulizer solution Take 3 mLs by nebulization every 6 (six) hours as needed for wheezing.   Yes [provider]  lovastatin (MEVACOR) 20 MG tablet Take 20 mg by mouth at bedtime.   Yes [provider]  metoprolol succinate (TOPROL-XL) 100 MG 24 hr tablet Take 100 mg by mouth daily. Take with or immediately following a meal.   Yes [provider]  omeprazole (PRILOSEC) 20 MG capsule Take 20 mg by mouth daily.    Yes [provider]  ramipril (ALTACE) 10 MG capsule Take 10 mg by mouth daily.   Yes [provider]  SPIRIVA RESPIMAT 1.25 MCG/ACT AERS Inhale 2 puffs into the lungs daily.  04/09/16  Yes [provider]  warfarin (COUMADIN) 5 MG tablet Take 5 mg by mouth one time only at 6 PM.    Yes [provider]  predniSONE (DELTASONE) 20 MG tablet Take 3 tablets (60 mg total) by mouth daily. Patient not taking: Reported on 10/13/2016 05/20/15   Hinda Kehr, MD    REVIEW OF SYSTEMS:  Review of Systems  Constitutional: Negative for chills, fever, malaise/fatigue and weight loss.  HENT: Negative for ear pain, hearing loss and tinnitus.   Eyes: Negative for blurred vision, double vision, pain and redness.  Respiratory: Positive for cough, shortness of breath and wheezing. Negative for hemoptysis.   Cardiovascular: Negative for chest pain, palpitations, orthopnea and leg swelling.  Gastrointestinal: Negative for abdominal pain, constipation, diarrhea, nausea and vomiting.  Genitourinary: Negative for dysuria, frequency and hematuria.  Musculoskeletal: Negative for back pain, joint pain and neck pain.  Skin:       No acne, rash, or lesions  Neurological: Negative for dizziness, tremors, focal weakness and weakness.  Endo/Heme/Allergies: Negative for polydipsia. Does not bruise/bleed easily.  Psychiatric/Behavioral: Negative for depression. The patient is not nervous/anxious and does not have insomnia.      VITAL SIGNS:   Vitals:   10/13/16 1957 10/13/16 2006 10/13/16 2015 10/13/16 2030  BP: 135/80  (!) 128/91 112/76  Pulse: 72  77 76  Resp: (!) 33  (!) 25 (!) 25  Temp: 98.8 F (37.1 C)     TempSrc: Axillary     SpO2: 100%  100% 100%  Weight:  69.9 kg (154 lb)    Height:  6\' 1"  (1.854 m)     Wt Readings from Last 3 Encounters:  10/13/16 69.9 kg (154 lb)  05/15/16 70.3 kg (155 lb)  12/09/15 69.9 kg (154 lb)    PHYSICAL EXAMINATION:  Physical Exam   Vitals reviewed. Constitutional: He is oriented to person, place, and time. He appears well-developed and well-nourished. No distress.  HENT:  Head: Normocephalic and atraumatic.  Mouth/Throat: Oropharynx is clear and moist.  Eyes: Conjunctivae and EOM are normal. Pupils are equal, round, and reactive to light. No scleral icterus.  Neck: Normal range of motion. Neck supple. No JVD present. No thyromegaly present.  Cardiovascular: Normal rate, regular rhythm and intact distal pulses.  Exam reveals no gallop and no friction rub.   No murmur heard. Respiratory: He is in respiratory distress. He has wheezes. He has no rales.  GI: Soft. Bowel sounds are normal. He exhibits no distension. There is no tenderness.  Musculoskeletal: Normal range of motion. He exhibits no edema.  No arthritis, no gout  Lymphadenopathy:    He has no cervical adenopathy.  Neurological: He is alert and oriented to  person, place, and time. No cranial nerve deficit.  No dysarthria, no aphasia  Skin: Skin is warm and dry. No rash noted. No erythema.  Psychiatric: He has a normal mood and affect. His behavior is normal. Judgment and thought content normal.    LABORATORY PANEL:   CBC  Recent Labs Lab 10/13/16 1958  WBC 11.3*  HGB 14.8  HCT 44.8  PLT 298   ------------------------------------------------------------------------------------------------------------------  Chemistries   Recent Labs Lab 10/13/16 1958  NA 130*  K 4.2  CL 94*  CO2 26  GLUCOSE 154*  BUN 14  CREATININE 1.07  CALCIUM 9.3  MG 1.8  AST 43*  ALT 25  ALKPHOS 89  BILITOT 1.0   ------------------------------------------------------------------------------------------------------------------  Cardiac Enzymes  Recent Labs Lab 10/13/16 1958  TROPONINI <0.03   ------------------------------------------------------------------------------------------------------------------  RADIOLOGY:  Dg Chest Port 1 View  Result  Date: 10/13/2016 CLINICAL DATA:  Acute respiratory failure EXAM: PORTABLE CHEST 1 VIEW COMPARISON:  05/20/2015 FINDINGS: Left-sided duo lead pacing device with similar appearance. Hyperinflation with emphysematous changes. No consolidation or effusion. Mild cardiomegaly with atherosclerosis. No pneumothorax. IMPRESSION: 1. Cardiomegaly without edema 2. Hyperinflation and emphysematous changes.  No acute infiltrate. Electronically Signed   By: Donavan Foil M.D.   On: 10/13/2016 20:23    EKG:   Orders placed or performed during the hospital encounter of 10/13/16  . ED EKG 12-Lead  . ED EKG 12-Lead  . EKG 12-Lead  . EKG 12-Lead    IMPRESSION AND PLAN:  Principal Problem:   Acute respiratory failure with hypoxia and hypercapnia (HCC) - due to COPD exacerbation, see below.  O2 sats stabilized with BiPAP, continue this for now.  Admit to ICU Active Problems:   COPD with acute exacerbation (HCC) - BiPAP as above, IV solumedrol, duonebs, antibiotics   Essential hypertension, benign - continue home meds   Chronic systolic CHF (congestive heart failure) ([place]) - continue home medications   AF (paroxysmal atrial fibrillation) ([place]) - home rate controlling meds and anticoagulation   PVD (peripheral vascular disease) ([place]) - home meds and anticoagulation  All the records are reviewed and case discussed with ED provider. Management plans discussed with the patient and/or family.  DVT PROPHYLAXIS: Systemic anticoagulation  GI PROPHYLAXIS: PPI  ADMISSION STATUS: Inpatient  CODE STATUS: Full Code Status History    This patient does not have a recorded code status. Please follow your organizational policy for patients in this situation.      TOTAL CRITICAL CARE TIME TAKING CARE OF THIS PATIENT: 50 minutes.   Jannifer Franklin, Mackson Botz [place] 10/13/2016, 9:30 PM  [facility]  Office  [phone]  CC: Primary care physician; Madelyn Brunner, MD  Note:  This document was prepared  using Dragon voice recognition software and may include unintentional dictation errors.

## 2016-10-14 DIAGNOSIS — J9601 Acute respiratory failure with hypoxia: Secondary | ICD-10-CM

## 2016-10-14 DIAGNOSIS — J441 Chronic obstructive pulmonary disease with (acute) exacerbation: Principal | ICD-10-CM

## 2016-10-14 DIAGNOSIS — J9602 Acute respiratory failure with hypercapnia: Secondary | ICD-10-CM

## 2016-10-14 LAB — BASIC METABOLIC PANEL
Anion gap: 10 (ref 5–15)
BUN: 16 mg/dL (ref 6–20)
CALCIUM: 9.1 mg/dL (ref 8.9–10.3)
CO2: 25 mmol/L (ref 22–32)
Chloride: 97 mmol/L — ABNORMAL LOW (ref 101–111)
Creatinine, Ser: 0.78 mg/dL (ref 0.61–1.24)
GFR calc Af Amer: >60 mL/min (ref >60)
GLUCOSE: 138 mg/dL — AB (ref 65–99)
Potassium: 3.8 mmol/L (ref 3.5–5.1)
Sodium: 132 mmol/L — ABNORMAL LOW (ref 135–145)

## 2016-10-14 LAB — PROTIME-INR
INR: 2.56
INR: 2.48
PROTHROMBIN TIME: 28 s — AB (ref 11.4–15.2)
Prothrombin Time: 27.3 seconds — ABNORMAL HIGH (ref 11.4–15.2)

## 2016-10-14 LAB — URINALYSIS, COMPLETE (UACMP) WITH MICROSCOPIC
BACTERIA UA: NONE SEEN
Bilirubin Urine: NEGATIVE
GLUCOSE, UA: 50 mg/dL — AB
KETONES UR: 20 mg/dL — AB
LEUKOCYTES UA: NEGATIVE
NITRITE: NEGATIVE
PH: 5 (ref 5.0–8.0)
PROTEIN: 30 mg/dL — AB
Specific Gravity, Urine: 1.015 (ref 1.005–1.030)
Squamous Epithelial / LPF: NONE SEEN
WBC UA: NONE SEEN WBC/hpf (ref 0–5)

## 2016-10-14 LAB — MRSA PCR SCREENING: MRSA by PCR: NEGATIVE

## 2016-10-14 LAB — CBC
HEMATOCRIT: 40.7 % (ref 40.0–52.0)
Hemoglobin: 13.6 g/dL (ref 13.0–18.0)
MCH: 28.7 pg (ref 26.0–34.0)
MCHC: 33.3 g/dL (ref 32.0–36.0)
MCV: 86.2 fL (ref 80.0–100.0)
Platelets: 221 10*3/uL (ref 150–440)
RBC: 4.72 MIL/uL (ref 4.40–5.90)
RDW: 13.8 % (ref 11.5–14.5)
WBC: 12.5 10*3/uL — ABNORMAL HIGH (ref 3.8–10.6)

## 2016-10-14 LAB — GLUCOSE, CAPILLARY: GLUCOSE-CAPILLARY: 155 mg/dL — AB (ref 65–99)

## 2016-10-14 MED ORDER — DOXYCYCLINE HYCLATE 100 MG PO TABS
100.0000 mg | ORAL_TABLET | Freq: Two times a day (BID) | ORAL | Status: AC
  Administered 2016-10-14 – 2016-10-18 (×10): 100 mg via ORAL
  Filled 2016-10-14 (×10): qty 1

## 2016-10-14 MED ORDER — ACETAMINOPHEN 650 MG RE SUPP: 650.0000 mg | Freq: Four times a day (QID) | RECTAL | Status: DC | PRN

## 2016-10-14 MED ORDER — WARFARIN SODIUM 5 MG PO TABS
5.0000 mg | ORAL_TABLET | Freq: Every day | ORAL | Status: DC
  Administered 2016-10-14 – 2016-10-16 (×3): 5 mg via ORAL
  Filled 2016-10-14 (×3): qty 1

## 2016-10-14 MED ORDER — METHYLPREDNISOLONE SODIUM SUCC 125 MG IJ SOLR
60.0000 mg | Freq: Four times a day (QID) | INTRAMUSCULAR | Status: DC
  Administered 2016-10-14 (×2): 60 mg via INTRAVENOUS
  Filled 2016-10-14 (×2): qty 2

## 2016-10-14 MED ORDER — METHYLPREDNISOLONE SODIUM SUCC 40 MG IJ SOLR
40.0000 mg | Freq: Two times a day (BID) | INTRAMUSCULAR | Status: AC
  Administered 2016-10-14 – 2016-10-15 (×2): 40 mg via INTRAVENOUS
  Filled 2016-10-14 (×2): qty 1

## 2016-10-14 MED ORDER — IPRATROPIUM-ALBUTEROL 0.5-2.5 (3) MG/3ML IN SOLN
3.0000 mL | Freq: Four times a day (QID) | RESPIRATORY_TRACT | Status: DC
  Administered 2016-10-14 – 2016-10-15 (×4): 3 mL via RESPIRATORY_TRACT
  Filled 2016-10-14 (×3): qty 3

## 2016-10-14 MED ORDER — ALPRAZOLAM 0.5 MG PO TABS
0.5000 mg | ORAL_TABLET | Freq: Every day | ORAL | Status: AC
  Administered 2016-10-14: 0.5 mg via ORAL
  Filled 2016-10-14: qty 1

## 2016-10-14 MED ORDER — ALPRAZOLAM 0.5 MG PO TABS: 0.5000 mg | ORAL_TABLET | Freq: Every evening | ORAL | Status: DC | PRN

## 2016-10-14 MED ORDER — METOPROLOL SUCCINATE ER 50 MG PO TB24
50.0000 mg | ORAL_TABLET | Freq: Every day | ORAL | Status: DC
  Administered 2016-10-14 – 2016-10-18 (×5): 50 mg via ORAL
  Filled 2016-10-14 (×6): qty 1

## 2016-10-14 MED ORDER — IPRATROPIUM-ALBUTEROL 0.5-2.5 (3) MG/3ML IN SOLN
3.0000 mL | RESPIRATORY_TRACT | Status: DC
  Administered 2016-10-14 (×2): 3 mL via RESPIRATORY_TRACT
  Filled 2016-10-14 (×2): qty 3

## 2016-10-14 MED ORDER — MOMETASONE FURO-FORMOTEROL FUM 200-5 MCG/ACT IN AERO
2.0000 | INHALATION_SPRAY | Freq: Two times a day (BID) | RESPIRATORY_TRACT | Status: DC
  Administered 2016-10-14 – 2016-10-19 (×13): 2 via RESPIRATORY_TRACT
  Filled 2016-10-14: qty 8.8

## 2016-10-14 MED ORDER — ONDANSETRON HCL 4 MG PO TABS: 4.0000 mg | ORAL_TABLET | Freq: Four times a day (QID) | ORAL | Status: DC | PRN

## 2016-10-14 MED ORDER — PANTOPRAZOLE SODIUM 40 MG PO TBEC
40.0000 mg | DELAYED_RELEASE_TABLET | Freq: Every day | ORAL | Status: DC
  Administered 2016-10-14 – 2016-10-23 (×10): 40 mg via ORAL
  Filled 2016-10-14 (×11): qty 1

## 2016-10-14 MED ORDER — TIOTROPIUM BROMIDE MONOHYDRATE 18 MCG IN CAPS
1.0000 | ORAL_CAPSULE | Freq: Every day | RESPIRATORY_TRACT | Status: DC
  Administered 2016-10-14 – 2016-10-23 (×10): 18 ug via RESPIRATORY_TRACT
  Filled 2016-10-14 (×2): qty 5

## 2016-10-14 MED ORDER — WARFARIN - PHARMACIST DOSING INPATIENT: Freq: Every day | Status: DC

## 2016-10-14 MED ORDER — CHLORHEXIDINE GLUCONATE 0.12 % MT SOLN
15.0000 mL | Freq: Two times a day (BID) | OROMUCOSAL | Status: DC
  Administered 2016-10-14 – 2016-10-23 (×18): 15 mL via OROMUCOSAL
  Filled 2016-10-14 (×18): qty 15

## 2016-10-14 MED ORDER — PRAVASTATIN SODIUM 20 MG PO TABS
20.0000 mg | ORAL_TABLET | Freq: Every day | ORAL | Status: DC
  Administered 2016-10-14 – 2016-10-22 (×9): 20 mg via ORAL
  Filled 2016-10-14 (×9): qty 1

## 2016-10-14 MED ORDER — ORAL CARE MOUTH RINSE: 15.0000 mL | Freq: Two times a day (BID) | OROMUCOSAL | Status: DC

## 2016-10-14 MED ORDER — ACETAMINOPHEN 325 MG PO TABS: 650.0000 mg | ORAL_TABLET | Freq: Four times a day (QID) | ORAL | Status: DC | PRN

## 2016-10-14 MED ORDER — ALPRAZOLAM 1 MG PO TABS
1.0000 mg | ORAL_TABLET | Freq: Every day | ORAL | Status: DC
  Administered 2016-10-14: 1 mg via ORAL
  Filled 2016-10-14: qty 1

## 2016-10-14 MED ORDER — IPRATROPIUM-ALBUTEROL 18-103 MCG/ACT IN AERO: 1.0000 | INHALATION_SPRAY | RESPIRATORY_TRACT | Status: DC

## 2016-10-14 MED ORDER — ONDANSETRON HCL 4 MG/2ML IJ SOLN: 4.0000 mg | Freq: Four times a day (QID) | INTRAMUSCULAR | Status: DC | PRN

## 2016-10-14 MED ORDER — ALPRAZOLAM 0.5 MG PO TABS
0.5000 mg | ORAL_TABLET | ORAL | Status: AC
  Administered 2016-10-14: 0.5 mg via ORAL
  Filled 2016-10-14: qty 1

## 2016-10-14 MED ORDER — ASPIRIN EC 81 MG PO TBEC
81.0000 mg | DELAYED_RELEASE_TABLET | Freq: Every day | ORAL | Status: DC
Start: 2016-10-14 — End: 2016-10-23
  Administered 2016-10-14 – 2016-10-23 (×10): 81 mg via ORAL
  Filled 2016-10-14 (×10): qty 1

## 2016-10-14 MED ORDER — PREDNISONE 50 MG PO TABS
60.0000 mg | ORAL_TABLET | Freq: Every day | ORAL | Status: DC
  Administered 2016-10-15 – 2016-10-16 (×2): 60 mg via ORAL
  Filled 2016-10-14 (×2): qty 1

## 2016-10-14 MED ORDER — ALPRAZOLAM 1 MG PO TABS
1.0000 mg | ORAL_TABLET | Freq: Every day | ORAL | Status: DC
  Administered 2016-10-15 – 2016-10-22 (×8): 1 mg via ORAL
  Filled 2016-10-14 (×8): qty 1

## 2016-10-14 MED ORDER — METHYLPREDNISOLONE SODIUM SUCC 40 MG IJ SOLR: 40.0000 mg | Freq: Two times a day (BID) | INTRAMUSCULAR | Status: DC

## 2016-10-14 NOTE — Consult Note (Signed)
.   Name: Tyler [name] MRN: [id number] DOB: [birth year]    ADMISSION DATE:  10/13/2016 CONSULTATION DATE:  10/14/2016   REFERRING MD :  Dr. Bridgett Larsson   CHIEF COMPLAINT: Shortness of Breath   BRIEF PATIENT DESCRIPTION:  61 yo old male admitted with 05/22 with acute on chronic hypercapnic hypoxic respiratory failure secondary to AECOPD  SIGNIFICANT EVENTS  05/22-Pt admitted to [facility]   STUDIES:  None    HISTORY OF PRESENT ILLNESS:   This is a 61 yo male with a PMH of Cardiac Pacemaker, PVD, HTN, Myocardial Infarction, CAD, COPD, Chronic Systolic CHF, Atrial Fibrillation, and AICD.  He presented to [facility] ER 05/22 with c/o worsening shortness of breath with chest congestion onset of symptoms several days prior to presentation to the ER.  Per ER notes he went to an outpatient clinic 05/21 due to symptoms and was prescribed azithromycin and prednisone.  However, due to worsening labored breathing pt notified EMS and upon EMS arrival the pt was found to be hypoxic with O2 sats in the low 90's. Therefore, he was placed on CPAP and given 2 albuterol treatments, 1 duoneb treatment, and solumedrol en route to the ER with minimal improvement of symptoms.  In the ER the pt was placed on continuous Bipap and admitted to the [facility] Unit.  PAST MEDICAL HISTORY :   has a past medical history of AICD (automatic cardioverter/defibrillator) present; Atrial fibrillation ([place]); CHF (congestive heart failure) ([place]); COPD (chronic obstructive pulmonary disease) ([place]); Coronary artery disease; Hypertension; Myocardial infarction ([place]); Peripheral vascular disease ([place]); and Presence of permanent cardiac pacemaker.  has a past surgical history that includes Below knee leg amputation (Left); Insert / replace / remove pacemaker; Cardiac catheterization (Left, 12/09/2015); and Cardiac catheterization (12/09/2015). Prior to Admission medications   Medication Sig Start Date End Date Taking? Authorizing Provider    albuterol (PROVENTIL HFA;VENTOLIN HFA) 108 (90 BASE) MCG/ACT inhaler Inhale 2 puffs into the lungs.   Yes [provider]  albuterol-ipratropium (COMBIVENT) 18-103 MCG/ACT inhaler Inhale 1 puff into the lungs every 4 (four) hours.   Yes [provider]  ALPRAZolam Duanne Moron) 1 MG tablet Take 1 mg by mouth at bedtime.   Yes [provider]  aspirin EC 81 MG tablet Take 1 tablet (81 mg total) by mouth daily. 12/09/15  Yes [name], Erskine Squibb, MD  calcium carbonate (TUMS - DOSED IN MG ELEMENTAL CALCIUM) 500 MG chewable tablet 1 tablet daily. prn   Yes [provider]  clotrimazole-betamethasone (LOTRISONE) cream Apply 1 application topically 2 (two) times daily.   Yes [provider]  docusate sodium (COLACE) 100 MG capsule Take 100 mg by mouth every other day.   Yes [provider]  fluticasone-salmeterol (ADVAIR HFA) 115-21 MCG/ACT inhaler USE 2 INHALATIONS ORALLY   EVERY 12 HOURS 04/06/16  Yes [provider]  levalbuterol (XOPENEX) 1.25 MG/3ML nebulizer solution Take 3 mLs by nebulization every 6 (six) hours as needed for wheezing.   Yes [provider]  lovastatin (MEVACOR) 20 MG tablet Take 20 mg by mouth at bedtime.   Yes [provider]  metoprolol succinate (TOPROL-XL) 100 MG 24 hr tablet Take 100 mg by mouth daily. Take with or immediately following a meal.   Yes [provider]  omeprazole (PRILOSEC) 20 MG capsule Take 20 mg by mouth daily.   Yes [provider]  ramipril (ALTACE) 10 MG capsule Take 10 mg by mouth daily.   Yes [provider]  [facility]  RESPIMAT 1.25 MCG/ACT AERS Inhale 2 puffs into the lungs daily.  04/09/16  Yes [provider]  warfarin (COUMADIN) 5 MG tablet Take 5 mg by mouth one time only at 6 PM.    Yes [provider]  predniSONE (DELTASONE) 20 MG tablet Take 3 tablets (60 mg total) by mouth daily. Patient not taking: Reported on 10/13/2016 05/20/15    Hinda Kehr, MD   No Known Allergies  FAMILY HISTORY:  family history includes Cancer in his father and mother; Heart disease in his father. SOCIAL HISTORY:  reports that he quit smoking about 3 years ago. His smoking use included Cigarettes. He has a 42.00 pack-year smoking history. He has never used smokeless tobacco. He reports that he does not drink alcohol or use drugs.  REVIEW OF SYSTEMS:  Positives in BOLD  Constitutional: Negative for fever, chills, weight loss, malaise/fatigue and diaphoresis.  HENT: Negative for hearing loss, ear pain, nosebleeds, congestion, sore throat, neck pain, tinnitus and ear discharge.   Eyes: Negative for blurred vision, double vision, photophobia, pain, discharge and redness.  Respiratory: cough, hemoptysis, sputum production, shortness of breath, chest congestion, wheezing and stridor.   Cardiovascular: Negative for chest pain, palpitations, orthopnea, claudication, leg swelling and PND.  Gastrointestinal: Negative for heartburn, nausea, vomiting, abdominal pain, diarrhea, constipation, blood in stool and melena.  Genitourinary: Negative for dysuria, urgency, frequency, hematuria and flank pain.  Musculoskeletal: Negative for myalgias, back pain, joint pain and falls.  Skin: Negative for itching and rash.  Neurological: Negative for dizziness, tingling, tremors, sensory change, speech change, focal weakness, seizures, loss of consciousness, weakness and headaches.  Endo/Heme/Allergies: Negative for environmental allergies and polydipsia. Does not bruise/bleed easily.  SUBJECTIVE:  Pt states breathing has improved significantly since he was put back on Bipap   VITAL SIGNS: Temp:  [97 F (36.1 C)-98.8 F (37.1 C)] 97 F (36.1 C) (05/23 1430) Pulse Rate:  [72-79] 76 (05/23 1430) Resp:  [15-33] 20 (05/23 1430) BP: (100-163)/(59-111) 163/88 (05/23 1430) SpO2:  [95 %-100 %] 100 % (05/23 1430) FiO2 (%):  [40 %] 40 % (05/23 1430) Weight:  [69.9 kg  (154 lb)-70.7 kg (155 lb 13.8 oz)] 70.7 kg (155 lb 13.8 oz) (05/23 0016)  PHYSICAL EXAMINATION: General: well developed, well nourished Caucasian male, NAD Neuro: alert and oriented, follows commands HEENT: supple, no JVD Cardiovascular: irregular irregular, no M/R/G Lungs: diminished throughout, even, non labored on continuous Bipap Abdomen: +BS x4, soft, non tender, non distended Musculoskeletal: right aka, normal tone Skin: intact no rashes or lesions   Recent Labs Lab 10/13/16 1958 10/14/16 0103  NA 130* 132*  K 4.2 3.8  CL 94* 97*  CO2 26 25  BUN 14 16  CREATININE 1.07 0.78  GLUCOSE 154* 138*    Recent Labs Lab 10/13/16 1958 10/14/16 0103  HGB 14.8 13.6  HCT 44.8 40.7  WBC 11.3* 12.5*  PLT 298 221   Dg Chest Port 1 View  Result Date: 10/13/2016 CLINICAL DATA:  Acute respiratory failure EXAM: PORTABLE CHEST 1 VIEW COMPARISON:  05/20/2015 FINDINGS: Left-sided duo lead pacing device with similar appearance. Hyperinflation with emphysematous changes. No consolidation or effusion. Mild cardiomegaly with atherosclerosis. No pneumothorax. IMPRESSION: 1. Cardiomegaly without edema 2. Hyperinflation and emphysematous changes.  No acute infiltrate. Electronically Signed   By: Donavan Foil M.D.   On: 10/13/2016 20:23    ASSESSMENT / PLAN: Acute on chronic hypoxic hypercapnic respiratory failure secondary to AECOPD  Chronic Atrial Fibrillation Hx: Pacemaker, HTN, CAD, AICD, and  MI P; Prn Bipap for dyspnea Maintain O2 sats 88% to 92% Continue scheduled and prn bronchodilator therapy  Continue IV steroids x2 doses  Continue abx Continue outpatient aspirin, metoprolol, pravastatin Continue coumadin dosing per pharmacy Continuous telemetry monitoring   Marda Stalker, [place] Pager [phone] (please enter 7 digits) PCCM Consult Pager [phone] (please enter 7 digits)   PCCM ATTENDING ATTESTATION:  I have evaluated patient with the APP  [place], reviewed database in its entirety and discussed care plan in detail. In addition, this patient was discussed on multidisciplinary rounds.   Important exam findings: Requiring intermittent BiPAP Earlier this morning, no significant distress on nasal cannula However, increased work of breathing and BiPAP resumed this afternoon Distant breath sounds without wheezes Paced rhythm, no murmurs Abdomen soft with normal bowel sounds Extremities warm without edema  Chest x-ray with cardiomegaly, no acute disease  Major problems addressed by PCCM team: Acute on chronic respiratory failure COPD exacerbation  PLAN/REC: PRN BiPAP Supplemental O2 Systemic steroids Nebulized steroids and bronchodilators Empiric antibiotics   Merton Border, MD PCCM service Mobile [phone] Pager [phone] 10/14/2016 4:36 PM

## 2016-10-14 NOTE — Care Management (Signed)
This RNCM received call from [facility] NP that patient is requesting home BiPAP. Patient will need over night oximetry on prescribed liter flow O2 (at home), ABG also, rule out OSA with CPAP.

## 2016-10-14 NOTE — Progress Notes (Signed)
ANTICOAGULATION CONSULT NOTE - Initial Consult  Pharmacy Consult for warfarin dosing Indication: atrial fibrillation  No Known Allergies  Patient Measurements: Height: 6\' 1"  (185.4 cm) Weight: 155 lb 13.8 oz (70.7 kg) IBW/kg (Calculated) : 79.9 Heparin Dosing Weight:   Vital Signs: Temp: 97.2 F (36.2 C) (05/23 0016) Temp Source: Axillary (05/22 1957) BP: 141/76 (05/22 2330) Pulse Rate: 76 (05/22 2315)  Labs:  Recent Labs  10/13/16 1958 10/14/16 0103  HGB 14.8 13.6  HCT 44.8 40.7  PLT 298 221  LABPROT  --  28.0*  INR  --  2.56  CREATININE 1.07 0.78  TROPONINI <0.03  --     Estimated Creatinine Clearance: 97 mL/min (by C-G formula based on SCr of 0.78 mg/dL).   Medical History: Past Medical History:  Diagnosis Date  . AICD (automatic cardioverter/defibrillator) present   . Atrial fibrillation ([place])   . CHF (congestive heart failure) ([place])   . COPD (chronic obstructive pulmonary disease) ([place])   . Coronary artery disease   . Hypertension   . Myocardial infarction ([place])   . Peripheral vascular disease ([place])   . Presence of permanent cardiac pacemaker     Medications:  Scheduled:  . ALPRAZolam  1 mg Oral QHS  . aspirin EC  81 mg Oral Daily  . chlorhexidine  15 mL Mouth Rinse BID  . ipratropium-albuterol  3 mL Nebulization Q4H  . mouth rinse  15 mL Mouth Rinse q12n4p  . methylPREDNISolone (SOLU-MEDROL) injection  60 mg Intravenous Q6H  . mometasone-formoterol  2 puff Inhalation BID  . pantoprazole  40 mg Oral Daily  . pravastatin  20 mg Oral q1800  . tiotropium  1 capsule Inhalation Daily  . warfarin  5 mg Oral q1800    Assessment: Patient admitted w/ SOB and is anticoagulated w/ warfarin for chronic afib. Home dose per med rec: warfarin 5 mg daily at 6 pm  5/23 INR 2.56  Goal of Therapy:  INR 2-3 Monitor platelets by anticoagulation protocol: Yes   Plan:  INR is in range will continue patient's home dose of warfarin of 5 mg daily. Will  monitor daily INRs closely as patient is on multiple antibiotics for PNA.  Thank you for this consult.  Tobie Lords, PharmD, BCPS Clinical Pharmacist 10/14/2016

## 2016-10-14 NOTE — Progress Notes (Signed)
[facility] at [place] NAME: Tyler [name]    MR#:  [id number]  DATE OF BIRTH:  [birth year]  SUBJECTIVE:  CHIEF COMPLAINT:   Chief Complaint  Patient presents with  . Respiratory Distress   Better cough and shortness breath. Off BiPAP this morning, on oxygen 2 L by nasal cannular. REVIEW OF SYSTEMS:  Review of Systems  Constitutional: Positive for malaise/fatigue. Negative for chills and fever.  HENT: Negative for congestion.   Eyes: Negative for blurred vision and double vision.  Respiratory: Positive for cough, sputum production, shortness of breath and wheezing. Negative for hemoptysis and stridor.   Cardiovascular: Negative for chest pain, palpitations and leg swelling.  Gastrointestinal: Negative for abdominal pain, blood in stool, diarrhea, melena, nausea and vomiting.  Genitourinary: Negative for dysuria and hematuria.  Musculoskeletal: Negative for back pain.  Neurological: Negative for dizziness, focal weakness, loss of consciousness and weakness.  Psychiatric/Behavioral: Negative for depression. The patient is not nervous/anxious.     DRUG ALLERGIES:  No Known Allergies VITALS:  Blood pressure (!) 147/76, pulse 77, temperature 97.7 F (36.5 C), temperature source Oral, resp. rate (!) 23, height 6\' 1"  (1.854 m), weight 155 lb 13.8 oz (70.7 kg), SpO2 98 %. PHYSICAL EXAMINATION:  Physical Exam  Constitutional: He is oriented to person, place, and time and well-developed, well-nourished, and in no distress.  HENT:  Head: Normocephalic.  Mouth/Throat: Oropharynx is clear and moist.  Eyes: Conjunctivae and EOM are normal. No scleral icterus.  Neck: Normal range of motion. Neck supple. No JVD present. No tracheal deviation present.  Cardiovascular: Normal rate, regular rhythm and normal heart sounds.  Exam reveals no gallop.   No murmur heard. Pulmonary/Chest: Effort normal. No respiratory distress. He has wheezes. He has no  rales.  Musculoskeletal: Normal range of motion. He exhibits no edema or tenderness.  Neurological: He is alert and oriented to person, place, and time. No cranial nerve deficit.  Skin: No rash noted. No erythema.  Psychiatric: Affect normal.   LABORATORY PANEL:  Male CBC  Recent Labs Lab 10/14/16 0103  WBC 12.5*  HGB 13.6  HCT 40.7  PLT 221   ------------------------------------------------------------------------------------------------------------------ Chemistries   Recent Labs Lab 10/13/16 1958 10/14/16 0103  NA 130* 132*  K 4.2 3.8  CL 94* 97*  CO2 26 25  GLUCOSE 154* 138*  BUN 14 16  CREATININE 1.07 0.78  CALCIUM 9.3 9.1  MG 1.8  --   AST 43*  --   ALT 25  --   ALKPHOS 89  --   BILITOT 1.0  --    RADIOLOGY:  Dg Chest Port 1 View  Result Date: 10/13/2016 CLINICAL DATA:  Acute respiratory failure EXAM: PORTABLE CHEST 1 VIEW COMPARISON:  05/20/2015 FINDINGS: Left-sided duo lead pacing device with similar appearance. Hyperinflation with emphysematous changes. No consolidation or effusion. Mild cardiomegaly with atherosclerosis. No pneumothorax. IMPRESSION: 1. Cardiomegaly without edema 2. Hyperinflation and emphysematous changes.  No acute infiltrate. Electronically Signed   By: Donavan Foil M.D.   On: 10/13/2016 20:23   ASSESSMENT AND PLAN:   Acute respiratory failure with hypoxia and hypercapnia (HCC) - due to COPD exacerbation,  Off BiPAP this am, on O2 [place] 2L. Continue IV solumedrol, duonebs, antibiotics    Essential hypertension, benign - continue home meds   Chronic systolic CHF (congestive heart failure) ([place]) - continue home medications   AF (paroxysmal atrial fibrillation) ([place]) - home rate controlling meds and anticoagulation, Coumadin level is  therapeutic.  CAD and PVD (peripheral vascular disease) ([place]) - home meds and anticoagulation  Hyponatremia. Improving.  All the records are reviewed and case discussed with Care Management/Social  Worker. Management plans discussed with the patient, family and they are in agreement.  CODE STATUS: Full Code  TOTAL TIME TAKING CARE OF THIS PATIENT: 37 minutes.   More than 50% of the time was spent in counseling/coordination of care: YES  POSSIBLE D/C IN 2-3 DAYS, DEPENDING ON CLINICAL CONDITION.   Demetrios Loll M.D on 10/14/2016 at 1:39 PM  Between 7am to 6pm - Pager - [phone]  After 6pm go to [email or web address] - Proofreader  [facility] [place] [facility]  Office  [phone]  CC: Primary care physician; Madelyn Brunner, MD  Note: This dictation was prepared with Dragon dictation along with smaller phrase technology. Any transcriptional errors that result from this process are unintentional.

## 2016-10-15 ENCOUNTER — Inpatient Hospital Stay: Payer: 59

## 2016-10-15 ENCOUNTER — Telehealth: Payer: Self-pay

## 2016-10-15 DIAGNOSIS — Z87891 Personal history of nicotine dependence: Secondary | ICD-10-CM

## 2016-10-15 LAB — PROTIME-INR
INR: 2.33
Prothrombin Time: 26 seconds — ABNORMAL HIGH (ref 11.4–15.2)

## 2016-10-15 LAB — BASIC METABOLIC PANEL
ANION GAP: 7 (ref 5–15)
BUN: 15 mg/dL (ref 6–20)
CALCIUM: 9.1 mg/dL (ref 8.9–10.3)
CO2: 27 mmol/L (ref 22–32)
CREATININE: 0.69 mg/dL (ref 0.61–1.24)
Chloride: 99 mmol/L — ABNORMAL LOW (ref 101–111)
Glucose, Bld: 120 mg/dL — ABNORMAL HIGH (ref 65–99)
Potassium: 3.9 mmol/L (ref 3.5–5.1)
SODIUM: 133 mmol/L — AB (ref 135–145)

## 2016-10-15 LAB — CBC
HEMATOCRIT: 38.3 % — AB (ref 40.0–52.0)
Hemoglobin: 13.1 g/dL (ref 13.0–18.0)
MCH: 29.6 pg (ref 26.0–34.0)
MCHC: 34.1 g/dL (ref 32.0–36.0)
MCV: 86.6 fL (ref 80.0–100.0)
Platelets: 219 10*3/uL (ref 150–440)
RBC: 4.42 MIL/uL (ref 4.40–5.90)
RDW: 13.9 % (ref 11.5–14.5)
WBC: 16 10*3/uL — AB (ref 3.8–10.6)

## 2016-10-15 LAB — URINE CULTURE: Culture: NO GROWTH

## 2016-10-15 LAB — MAGNESIUM: Magnesium: 1.8 mg/dL (ref 1.7–2.4)

## 2016-10-15 LAB — PHOSPHORUS: PHOSPHORUS: 2.8 mg/dL (ref 2.5–4.6)

## 2016-10-15 MED ORDER — ALPRAZOLAM 0.25 MG PO TABS
0.2500 mg | ORAL_TABLET | Freq: Three times a day (TID) | ORAL | Status: DC | PRN
  Administered 2016-10-15 – 2016-10-23 (×11): 0.25 mg via ORAL
  Filled 2016-10-15 (×13): qty 1

## 2016-10-15 MED ORDER — IPRATROPIUM-ALBUTEROL 0.5-2.5 (3) MG/3ML IN SOLN
3.0000 mL | RESPIRATORY_TRACT | Status: DC | PRN
  Administered 2016-10-15 – 2016-10-20 (×11): 3 mL via RESPIRATORY_TRACT
  Filled 2016-10-15 (×11): qty 3

## 2016-10-15 MED ORDER — MORPHINE SULFATE (PF) 4 MG/ML IV SOLN
2.0000 mg | INTRAVENOUS | Status: DC | PRN
  Administered 2016-10-15 – 2016-10-22 (×25): 2 mg via INTRAVENOUS
  Filled 2016-10-15 (×25): qty 1

## 2016-10-15 MED ORDER — PNEUMOCOCCAL VAC POLYVALENT 25 MCG/0.5ML IJ INJ
0.5000 mL | INJECTION | INTRAMUSCULAR | Status: DC
  Filled 2016-10-15 (×2): qty 0.5

## 2016-10-15 NOTE — Telephone Encounter (Signed)
Initial visit with CHF clinic scheduled for October 28, 2016 at 11:20am

## 2016-10-15 NOTE — Progress Notes (Signed)
[place] at [place] NAME: Tyler [name]    MR#:  [id number]  DATE OF BIRTH:  December 22, 1955  SUBJECTIVE:  CHIEF COMPLAINT:   Chief Complaint  Patient presents with  . Respiratory Distress   Better cough and shortness breath on exertion. But on Intermittent BiPAP, on oxygen 2 L by nasal cannula now. REVIEW OF SYSTEMS:  Review of Systems  Constitutional: Positive for malaise/fatigue. Negative for chills and fever.  HENT: Negative for congestion.   Eyes: Negative for blurred vision and double vision.  Respiratory: Positive for cough and shortness of breath. Negative for hemoptysis, sputum production, wheezing and stridor.   Cardiovascular: Negative for chest pain, palpitations and leg swelling.  Gastrointestinal: Negative for abdominal pain, blood in stool, diarrhea, melena, nausea and vomiting.  Genitourinary: Negative for dysuria and hematuria.  Musculoskeletal: Negative for back pain.  Neurological: Negative for dizziness, focal weakness, loss of consciousness and weakness.  Psychiatric/Behavioral: Negative for depression. The patient is not nervous/anxious.     DRUG ALLERGIES:  No Known Allergies VITALS:  Blood pressure (!) 102/56, pulse 79, temperature 97.6 F (36.4 C), temperature source Oral, resp. rate 18, height 6\' 1"  (1.854 m), weight 155 lb 6.8 oz (70.5 kg), SpO2 95 %. PHYSICAL EXAMINATION:  Physical Exam  Constitutional: He is oriented to person, place, and time and well-developed, well-nourished, and in no distress.  HENT:  Head: Normocephalic.  Mouth/Throat: Oropharynx is clear and moist.  Eyes: Conjunctivae and EOM are normal. No scleral icterus.  Neck: Normal range of motion. Neck supple. No JVD present. No tracheal deviation present.  Cardiovascular: Normal rate, regular rhythm and normal heart sounds.  Exam reveals no gallop.   No murmur heard. Pulmonary/Chest: Effort normal. No respiratory distress. He has wheezes.  He has no rales.  Musculoskeletal: Normal range of motion. He exhibits no edema or tenderness.  Neurological: He is alert and oriented to person, place, and time. No cranial nerve deficit.  Skin: No rash noted. No erythema.  Psychiatric: Affect normal.   LABORATORY PANEL:  Male CBC  Recent Labs Lab 10/15/16 0336  WBC 16.0*  HGB 13.1  HCT 38.3*  PLT 219   ------------------------------------------------------------------------------------------------------------------ Chemistries   Recent Labs Lab 10/13/16 1958  10/15/16 0336  NA 130*  < > 133*  K 4.2  < > 3.9  CL 94*  < > 99*  CO2 26  < > 27  GLUCOSE 154*  < > 120*  BUN 14  < > 15  CREATININE 1.07  < > 0.69  CALCIUM 9.3  < > 9.1  MG 1.8  --  1.8  AST 43*  --   --   ALT 25  --   --   ALKPHOS 89  --   --   BILITOT 1.0  --   --   < > = values in this interval not displayed. RADIOLOGY:  Dg Chest Port 1 View  Result Date: 10/15/2016 CLINICAL DATA:  Worsening shortness of Breath EXAM: PORTABLE CHEST 1 VIEW COMPARISON:  10/13/2016 FINDINGS: Cardiac shadow is mildly prominent. Defibrillator is again seen and stable. The lungs are hyperinflated consistent with COPD. No focal infiltrate or sizable effusion is noted. No bony abnormality is seen. IMPRESSION: COPD without acute abnormality. Electronically Signed   By: Inez Catalina M.D.   On: 10/15/2016 09:30   ASSESSMENT AND PLAN:   Acute respiratory failure with hypoxia and hypercapnia (HCC) - due to COPD exacerbation,  Prn BiPAP, continue O2  [place] 2L. He was on IV solumedrol, changed to prednisone, continue duonebs, Spiriva, doxycycline.    Essential hypertension, benign - continue home meds   Chronic systolic CHF (congestive heart failure) ([place]) - continue home medications   AF (paroxysmal atrial fibrillation) ([place]) - home rate controlling meds and anticoagulation, Coumadin level is therapeutic.  CAD and PVD (peripheral vascular disease) ([place]) - home meds and  anticoagulation  Hyponatremia. Improving.  All the records are reviewed and case discussed with Care Management/Social Worker. Management plans discussed with the patient, family and they are in agreement.  CODE STATUS: Full Code  TOTAL TIME TAKING CARE OF THIS PATIENT: 36 minutes.   More than 50% of the time was spent in counseling/coordination of care: YES  POSSIBLE D/C IN 3 DAYS, DEPENDING ON CLINICAL CONDITION.   Demetrios Loll M.D on 10/15/2016 at 12:32 PM  Between 7am to 6pm - Pager - [phone]  After 6pm go to [email or web address] - Proofreader  [facility] [place] [facility]  Office  [phone]  CC: Primary care physician; Madelyn Brunner, MD  Note: This dictation was prepared with Dragon dictation along with smaller phrase technology. Any transcriptional errors that result from this process are unintentional.

## 2016-10-15 NOTE — Care Management (Signed)
Team meeting this AM. Patient will need to be assessed for home O2 prior to BiPAP.

## 2016-10-15 NOTE — Progress Notes (Signed)
Per Hinton Dyer, NP patient to be transferred without telemetry. Tyler [name]

## 2016-10-15 NOTE — Plan of Care (Signed)
Problem: Physical Regulation: Goal: Ability to maintain clinical measurements within normal limits will improve Outcome: Progressing Pt transferred from ICU. Upon transfer, pt c/o sob, dyspnea at rest, O2 sats 98% on 2L O2 per [place], RR 20. Duoneb given by respiratory therapist with improvement.  Pt c/o anxiety once. Dr Bridgett Larsson notified, xanax ordered and given.

## 2016-10-15 NOTE — Progress Notes (Signed)
ANTICOAGULATION CONSULT NOTE - Initial Consult  Pharmacy Consult for warfarin dosing Indication: atrial fibrillation  No Known Allergies  Patient Measurements: Height: 6\' 1"  (185.4 cm) Weight: 155 lb 6.8 oz (70.5 kg) IBW/kg (Calculated) : 79.9 Heparin Dosing Weight:   Vital Signs: Temp: 97.6 F (36.4 C) (05/24 0800) Temp Source: Oral (05/24 0800) BP: 102/56 (05/24 1100) Pulse Rate: 79 (05/24 1100)  Labs:  Recent Labs  10/13/16 1958 10/14/16 0103 10/14/16 0541 10/15/16 0336  HGB 14.8 13.6  --  13.1  HCT 44.8 40.7  --  38.3*  PLT 298 221  --  219  LABPROT  --  28.0* 27.3* 26.0*  INR  --  2.56 2.48 2.33  CREATININE 1.07 0.78  --  0.69  TROPONINI <0.03  --   --   --     Estimated Creatinine Clearance: 96.7 mL/min (by C-G formula based on SCr of 0.69 mg/dL).   Medical History: Past Medical History:  Diagnosis Date  . AICD (automatic cardioverter/defibrillator) present   . Atrial fibrillation ([place])   . CHF (congestive heart failure) ([place])   . COPD (chronic obstructive pulmonary disease) ([place])   . Coronary artery disease   . Hypertension   . Myocardial infarction ([place])   . Peripheral vascular disease ([place])   . Presence of permanent cardiac pacemaker     Medications:  Scheduled:  . ALPRAZolam  1 mg Oral QHS  . aspirin EC  81 mg Oral Daily  . chlorhexidine  15 mL Mouth Rinse BID  . doxycycline  100 mg Oral Q12H  . metoprolol succinate  50 mg Oral Q breakfast  . mometasone-formoterol  2 puff Inhalation BID  . pantoprazole  40 mg Oral Daily  . pravastatin  20 mg Oral q1800  . predniSONE  60 mg Oral Q breakfast  . tiotropium  1 capsule Inhalation Daily  . warfarin  5 mg Oral q1800    Assessment: Patient admitted w/ SOB and is anticoagulated w/ warfarin for chronic afib. Home dose per med rec: warfarin 5 mg daily at 6 pm  5/23 INR 2.56 5/24: INR: 2.33   Goal of Therapy:  INR 2-3 Monitor platelets by anticoagulation protocol: Yes   Plan:  INR is in  range will continue patient's home dose of warfarin of 5 mg daily. Will monitor daily INRs closely as patient is on multiple antibiotics for PNA.  Thank you for this consult. Larene Beach, PharmD  Clinical Pharmacist 10/15/2016

## 2016-10-15 NOTE — Progress Notes (Addendum)
.   Name: Tyler [name] MRN: [id number] DOB: 11/05/1955    ADMISSION DATE:  10/13/2016  BRIEF PATIENT DESCRIPTION:  61 yo old male admitted with 05/22 with acute on chronic hypercapnic hypoxic respiratory failure secondary to AECOPD requiring intermittent Bipap   SIGNIFICANT EVENTS  05/22-Pt admitted to [facility]   STUDIES:  None   SUBJECTIVE:  Pt states he still is short of breath with ambulation.  VITAL SIGNS: Temp:  [97 F (36.1 C)-97.7 F (36.5 C)] 97.6 F (36.4 C) (05/24 0800) Pulse Rate:  [72-79] 78 (05/24 0900) Resp:  [15-28] 28 (05/24 0800) BP: (106-163)/(69-98) 145/77 (05/24 0900) SpO2:  [93 %-100 %] 93 % (05/24 0900) FiO2 (%):  [40 %] 40 % (05/24 0400) Weight:  [70.5 kg (155 lb 6.8 oz)] 70.5 kg (155 lb 6.8 oz) (05/24 0500)  PHYSICAL EXAMINATION: General: well developed, well nourished Caucasian male, NAD Neuro: alert and oriented, follows commands HEENT: supple, no JVD Cardiovascular: irregular irregular, no M/R/G Lungs: diminished throughout, even, non labored  Abdomen: +BS x4, soft, non tender, non distended Musculoskeletal: right aka, normal tone Skin: intact no rashes or lesions   Recent Labs Lab 10/13/16 1958 10/14/16 0103 10/15/16 0336  NA 130* 132* 133*  K 4.2 3.8 3.9  CL 94* 97* 99*  CO2 26 25 27   BUN 14 16 15   CREATININE 1.07 0.78 0.69  GLUCOSE 154* 138* 120*    Recent Labs Lab 10/13/16 1958 10/14/16 0103 10/15/16 0336  HGB 14.8 13.6 13.1  HCT 44.8 40.7 38.3*  WBC 11.3* 12.5* 16.0*  PLT 298 221 219   Dg Chest Port 1 View  Result Date: 10/15/2016 CLINICAL DATA:  Worsening shortness of Breath EXAM: PORTABLE CHEST 1 VIEW COMPARISON:  10/13/2016 FINDINGS: Cardiac shadow is mildly prominent. Defibrillator is again seen and stable. The lungs are hyperinflated consistent with COPD. No focal infiltrate or sizable effusion is noted. No bony abnormality is seen. IMPRESSION: COPD without acute abnormality. Electronically Signed   By:  Inez Catalina M.D.   On: 10/15/2016 09:30   Dg Chest Port 1 View  Result Date: 10/13/2016 CLINICAL DATA:  Acute respiratory failure EXAM: PORTABLE CHEST 1 VIEW COMPARISON:  05/20/2015 FINDINGS: Left-sided duo lead pacing device with similar appearance. Hyperinflation with emphysematous changes. No consolidation or effusion. Mild cardiomegaly with atherosclerosis. No pneumothorax. IMPRESSION: 1. Cardiomegaly without edema 2. Hyperinflation and emphysematous changes.  No acute infiltrate. Electronically Signed   By: Donavan Foil M.D.   On: 10/13/2016 20:23    ASSESSMENT / PLAN: Acute on chronic hypoxic hypercapnic respiratory failure secondary to AECOPD  Chronic Atrial Fibrillation Hx: Pacemaker, HTN, CAD, AICD, and MI P: Supplemental O2  Ambulatory O2 on RA to assess home O2 needs  Maintain O2 sats 88% to 92% Systemic steroids Nebulized steroids and bronchodilators Continue abx-stop date 05/28 Continue outpatient aspirin, metoprolol, pravastatin Continue coumadin dosing per pharmacy Continuous telemetry monitoring  Cardiology consulted appreciate input Physical Therapy consulted appreciate input Will need to follow-up with Dr. Alva Garnet at discharge in 4-6 weeks for COPD management    Marda Stalker, [place] Pager [phone] (please enter 7 digits) PCCM Consult Pager [phone] (please enter 7 digits)   PCCM ATTENDING ATTESTATION:  I have evaluated patient with the APP [place], reviewed database in its entirety and discussed care plan in detail. In addition, this patient was discussed on multidisciplinary rounds.   Important exam findings: NAD on [place] O2 Few scattered wheezes Rest WNL  Major problems addressed by PCCM team:  Acute on chronic respiratory failure with hypoxemia COPD exacerbation   PLAN/REC: Continue to treat COPD exacerbation with systemic steroids, inhaled steroids and bronchodilators I have transitioned from intravenous and  nebulized forms to a regimen that can be continued at home He will need to have oxygen needs assessed prior to discharge. It is likely that he will qualify for home oxygen therapy I have arranged for follow-up with me in the office in 4-6 weeks per his request After transfer, PCCM will sign off. Please call if we can be of further assistance   Note to self: We discussed lung cancer screening. He was scheduled to have LDCT at [facility]. I suggested that he cancel this and we will reschedule it at our facility in the near future  Merton Border, MD PCCM service Mobile [phone] Pager [phone] 10/15/2016 3:27 PM

## 2016-10-15 NOTE — Progress Notes (Signed)
Patient transferred to room 105. Report given to [facility], [place] and Elink notified. Wilnette Kales

## 2016-10-16 ENCOUNTER — Telehealth: Payer: Self-pay | Admitting: Pulmonary Disease

## 2016-10-16 LAB — BLOOD GAS, ARTERIAL
Acid-Base Excess: 1.5 mmol/L (ref 0.0–2.0)
BICARBONATE: 27.2 mmol/L (ref 20.0–28.0)
O2 Saturation: 95.9 %
PATIENT TEMPERATURE: 37
PCO2 ART: 46 mmHg (ref 32.0–48.0)
PH ART: 7.38 (ref 7.350–7.450)
PO2 ART: 83 mmHg (ref 83.0–108.0)

## 2016-10-16 LAB — PROTIME-INR
INR: 2.69
Prothrombin Time: 29.1 seconds — ABNORMAL HIGH (ref 11.4–15.2)

## 2016-10-16 MED ORDER — METHYLPREDNISOLONE SODIUM SUCC 40 MG IJ SOLR
40.0000 mg | Freq: Two times a day (BID) | INTRAMUSCULAR | Status: DC
  Administered 2016-10-16 (×2): 40 mg via INTRAVENOUS
  Filled 2016-10-16 (×2): qty 1

## 2016-10-16 MED ORDER — RAMIPRIL 10 MG PO CAPS
10.0000 mg | ORAL_CAPSULE | Freq: Every day | ORAL | Status: DC
  Administered 2016-10-16 – 2016-10-20 (×5): 10 mg via ORAL
  Filled 2016-10-16 (×5): qty 1

## 2016-10-16 MED ORDER — GUAIFENESIN 100 MG/5ML PO SOLN: 5.0000 mL | ORAL | Status: DC | PRN

## 2016-10-16 MED ORDER — DOCUSATE SODIUM 100 MG PO CAPS
100.0000 mg | ORAL_CAPSULE | Freq: Two times a day (BID) | ORAL | Status: DC
  Administered 2016-10-16 – 2016-10-23 (×15): 100 mg via ORAL
  Filled 2016-10-16 (×15): qty 1

## 2016-10-16 MED ORDER — BISACODYL 5 MG PO TBEC
10.0000 mg | DELAYED_RELEASE_TABLET | Freq: Every day | ORAL | Status: DC | PRN
  Administered 2016-10-16 – 2016-10-23 (×6): 10 mg via ORAL
  Filled 2016-10-16 (×6): qty 2

## 2016-10-16 NOTE — Discharge Instructions (Signed)
Heart Failure Clinic appointment on October 28, 2016 at 11:20am with Darylene Price, [place]. Please call [phone] to reschedule.

## 2016-10-16 NOTE — Progress Notes (Signed)
bipap on standby. Patient wants to use however, he has been given meds to help bowels move and anticipate he will be up all night on bedside commode. Will continue to monitor. Patient is in no distress at this time

## 2016-10-16 NOTE — Progress Notes (Signed)
ANTICOAGULATION CONSULT NOTE - Follow UP  Consult  Pharmacy Consult for warfarin dosing Indication: atrial fibrillation  No Known Allergies  Patient Measurements: Height: 6\' 1"  (185.4 cm) Weight: 155 lb (70.3 kg) IBW/kg (Calculated) : 79.9 Heparin Dosing Weight:   Vital Signs: Temp: 98.6 F (37 C) (05/25 0508) BP: 161/67 (05/25 0902) Pulse Rate: 78 (05/25 0902)  Labs:  Recent Labs  10/13/16 1958  10/14/16 0103 10/14/16 0541 10/15/16 0336 10/16/16 0410  HGB 14.8  --  13.6  --  13.1  --   HCT 44.8  --  40.7  --  38.3*  --   PLT 298  --  221  --  219  --   LABPROT  --   < > 28.0* 27.3* 26.0* 29.1*  INR  --   < > 2.56 2.48 2.33 2.69  CREATININE 1.07  --  0.78  --  0.69  --   TROPONINI <0.03  --   --   --   --   --   < > = values in this interval not displayed.  Estimated Creatinine Clearance: 96.4 mL/min (by C-G formula based on SCr of 0.69 mg/dL).   Medical History: Past Medical History:  Diagnosis Date  . AICD (automatic cardioverter/defibrillator) present   . Atrial fibrillation ([place])   . CHF (congestive heart failure) ([place])   . COPD (chronic obstructive pulmonary disease) ([place])   . Coronary artery disease   . Hypertension   . Myocardial infarction ([place])   . Peripheral vascular disease ([place])   . Presence of permanent cardiac pacemaker     Medications:  Scheduled:  . ALPRAZolam  1 mg Oral QHS  . aspirin EC  81 mg Oral Daily  . chlorhexidine  15 mL Mouth Rinse BID  . doxycycline  100 mg Oral Q12H  . metoprolol succinate  50 mg Oral Q breakfast  . mometasone-formoterol  2 puff Inhalation BID  . pantoprazole  40 mg Oral Daily  . pneumococcal 23 valent vaccine  0.5 mL Intramuscular Tomorrow-1000  . pravastatin  20 mg Oral q1800  . predniSONE  60 mg Oral Q breakfast  . tiotropium  1 capsule Inhalation Daily  . warfarin  5 mg Oral q1800    Assessment: Patient admitted w/ SOB and is anticoagulated w/ warfarin for chronic afib. Home dose per med rec:  warfarin 5 mg daily at 6 pm  5/23 INR 2.56 5/24: INR: 2.33  5/25: INR: 2.69   Goal of Therapy:  INR 2-3 Monitor platelets by anticoagulation protocol: Yes   Plan:  INR is in range will continue patient's home dose of warfarin of 5 mg daily.  Thank you for this consult. Larene Beach, PharmD  Clinical Pharmacist 10/16/2016

## 2016-10-16 NOTE — Progress Notes (Addendum)
[facility] at [place] NAME: Tyler [name]    MR#:  [id number]  DATE OF BIRTH:  05-Nov-1955  SUBJECTIVE:  CHIEF COMPLAINT:   Chief Complaint  Patient presents with  . Respiratory Distress   Better cough and shortness breath but wheezing again. Off Intermittent BiPAP, on oxygen 2 L by nasal cannula now. REVIEW OF SYSTEMS:  Review of Systems  Constitutional: Positive for malaise/fatigue. Negative for chills and fever.  HENT: Negative for congestion.   Eyes: Negative for blurred vision and double vision.  Respiratory: Positive for cough, shortness of breath and wheezing. Negative for hemoptysis, sputum production and stridor.   Cardiovascular: Negative for chest pain, palpitations and leg swelling.  Gastrointestinal: Positive for constipation. Negative for abdominal pain, blood in stool, diarrhea, melena, nausea and vomiting.  Genitourinary: Negative for dysuria and hematuria.  Musculoskeletal: Negative for back pain.  Neurological: Negative for dizziness, focal weakness, loss of consciousness and weakness.  Psychiatric/Behavioral: Negative for depression. The patient is not nervous/anxious.     DRUG ALLERGIES:  No Known Allergies VITALS:  Blood pressure (!) 146/82, pulse 74, temperature 97.6 F (36.4 C), temperature source Oral, resp. rate 16, height 6\' 1"  (1.854 m), weight 155 lb (70.3 kg), SpO2 98 %. PHYSICAL EXAMINATION:  Physical Exam  Constitutional: He is oriented to person, place, and time and well-developed, well-nourished, and in no distress.  HENT:  Head: Normocephalic.  Mouth/Throat: Oropharynx is clear and moist.  Eyes: Conjunctivae and EOM are normal. No scleral icterus.  Neck: Normal range of motion. Neck supple. No JVD present. No tracheal deviation present.  Cardiovascular: Normal rate, regular rhythm and normal heart sounds.  Exam reveals no gallop.   No murmur heard. Pulmonary/Chest: Effort normal. No respiratory  distress. He has wheezes. He has no rales.  Musculoskeletal: Normal range of motion. He exhibits no edema or tenderness.  Right AKA.  Neurological: He is alert and oriented to person, place, and time. No cranial nerve deficit.  Skin: No rash noted. No erythema.  Psychiatric: Affect normal.   LABORATORY PANEL:  Male CBC  Recent Labs Lab 10/15/16 0336  WBC 16.0*  HGB 13.1  HCT 38.3*  PLT 219   ------------------------------------------------------------------------------------------------------------------ Chemistries   Recent Labs Lab 10/13/16 1958  10/15/16 0336  NA 130*  < > 133*  K 4.2  < > 3.9  CL 94*  < > 99*  CO2 26  < > 27  GLUCOSE 154*  < > 120*  BUN 14  < > 15  CREATININE 1.07  < > 0.69  CALCIUM 9.3  < > 9.1  MG 1.8  --  1.8  AST 43*  --   --   ALT 25  --   --   ALKPHOS 89  --   --   BILITOT 1.0  --   --   < > = values in this interval not displayed. RADIOLOGY:  No results found. ASSESSMENT AND PLAN:   Acute respiratory failure with hypoxia and hypercapnia (HCC) - due to COPD exacerbation,  Prn BiPAP, continue O2 [place] 2L. He was on IV solumedrol, changed to prednisone,  Start IV Solu-Medrol, continue duonebs, Spiriva, doxycycline.    Essential hypertension, benign - continue home meds   Chronic systolic CHF (congestive heart failure) ([place]) - continue home medications   AF (paroxysmal atrial fibrillation) ([place]) - home rate controlling meds and anticoagulation, Coumadin level is therapeutic.  CAD and PVD (peripheral vascular disease) ([place]) - home meds  and anticoagulation  Hyponatremia. Improving.  PT suggest home health and PT All the records are reviewed and case discussed with Care Management/Social Worker. Management plans discussed with the patient, his sister and they are in agreement.  CODE STATUS: Full Code  TOTAL TIME TAKING CARE OF THIS PATIENT: 38 minutes.   More than 50% of the time was spent in counseling/coordination of care:  YES  POSSIBLE D/C IN 2-3 DAYS, DEPENDING ON CLINICAL CONDITION.   Demetrios Loll M.D on 10/16/2016 at 2:51 PM  Between 7am to 6pm - Pager - [phone]  After 6pm go to [email or web address] - Proofreader  [facility] [place]  Office  [phone]  CC: Primary care physician; Madelyn Brunner, MD  Note: This dictation was prepared with Dragon dictation along with smaller phrase technology. Any transcriptional errors that result from this process are unintentional.

## 2016-10-16 NOTE — Telephone Encounter (Signed)
-----   Message from Renelda Mom, [place] sent at 05/27/7251  3:44 PM EDT ----- Please schedule as directed below.  Thanks, Misty ----- Message ----- From: Wilhelmina Mcardle, MD Sent: 10/15/2016  11:56 AM To: Renelda Mom, LPN  Post hospital follow up in 4-6 weeks  Thanks  Waunita Schooner

## 2016-10-16 NOTE — Telephone Encounter (Signed)
July Schedule not available for scheduling .  Will call patient when it is available to set up 4-6 wk fu with DS.

## 2016-10-16 NOTE — Plan of Care (Signed)
Problem: Bowel/Gastric: Goal: Will not experience complications related to bowel motility Outcome: Progressing Last BM 10/13/16. Pt c/o constipation. Colace and dulcolax ordered and given. No BM yet.  Problem: Activity: Goal: Ability to tolerate increased activity will improve Outcome: Progressing Dyspnea and sob at rest. Wheezes. Continue on 2L O2 per [place] with O2 sats in the high 90's. Pt is back on solumedrol, duoneb prn and inhaler.  Problem: Respiratory: Goal: Levels of oxygenation will improve Outcome: Progressing As above

## 2016-10-16 NOTE — Evaluation (Signed)
Physical Therapy Evaluation Patient Details Name: Tyler [name] MRN: [id number] DOB: [birth year] Today's Date: 10/16/2016   History of Present Illness  Humphrey Guerreiro is a 61yo white male who comes to [facility] on 5/22 with 4D SOB, admitted for acute respiratory failure and hypoxia/hypercapnia. PMH: Rt AKA (8Ya), AF c AICD/PPM, CHF, COPD, CAD, HTN, PVD. At baseline pt performs household distance AMB with AC. He performs ADL independently but requires significantly increased time due to severe SOB/DOe at baseline.   Clinical Impression  Pt admitted with above diagnosis. Pt currently with functional limitations due to the deficits listed below (see "PT Problem List"). Pt baseline level of activity tolerance is limited by severe COPD, experiencing SOB with any self care activity, as well as AMB distances outside of the home. At evaluation today, the patient has 8/10 SOB while resting, SpO2 WNL, however with rapid desaturation during sustained (30+sec) stance at bedside all on 2L/min. Pt will benefit from skilled PT intervention to increase independence and safety with basic mobility in preparation for discharge to the venue listed below.       Follow Up Recommendations Home health PT    Equipment Recommendations  None recommended by PT    Recommendations for Other Services       Precautions / Restrictions Precautions Precautions: Fall      Mobility  Bed Mobility Overal bed mobility: Modified Independent             General bed mobility comments: additional time  Transfers Overall transfer level: Needs assistance Equipment used: None Transfers: Sit to/from Stand           General transfer comment: maintains standing for 30sec at bedside, SpO2 drops to <85% momentarily on 2L/min with quick recovery after return to sitting in [facility]   Ambulation/Gait Ambulation/Gait assistance:  (unable at this time)              Stairs            Wheelchair Mobility    Modified  [place] (Stroke Patients Only)       Balance Overall balance assessment: Modified Independent;No apparent balance deficits (not formally assessed)                                           Pertinent Vitals/Pain Pain Assessment: No/denies pain    Home Living Family/patient expects to be discharged to:: Private residence Living Arrangements: Spouse/significant other Available Help at Discharge: Family Type of Home: Mobile home Home Access: Stairs to enter Entrance Stairs-Rails: None Entrance Stairs-Number of Steps: 1 Home Layout: One level Home Equipment: Crutches      Prior Function Level of Independence: Independent with assistive device(s)   Gait / Transfers Assistance Needed: household distance with [facility] only  ADL's / Homemaking Assistance Needed: requires significan tadditional time (1 hour to bath)         Hand Dominance        Extremity/Trunk Assessment   Upper Extremity Assessment Upper Extremity Assessment: Overall WFL for tasks assessed    Lower Extremity Assessment Lower Extremity Assessment: Overall WFL for tasks assessed       Communication   Communication: Expressive difficulties (pauses intermittently to catch his breath)  Cognition Arousal/Alertness: Awake/alert Behavior During Therapy: WFL for tasks assessed/performed Overall Cognitive Status: Within Functional Limits for tasks assessed  General Comments      Exercises     Assessment/Plan    PT Assessment Patient needs continued PT services  PT Problem List Decreased activity tolerance;Decreased mobility;Cardiopulmonary status limiting activity       PT Treatment Interventions Functional mobility training;Therapeutic exercise;Therapeutic activities;Patient/family education    PT Goals (Current goals can be found in the Care Plan section)  Acute Rehab PT Goals Patient Stated Goal: Improve ability to breath at  rest PT Goal Formulation: With patient Time For Goal Achievement: 10/30/16 Potential to Achieve Goals: Fair    Frequency Min 2X/week   Barriers to discharge        Co-evaluation               AM-PAC PT "6 Clicks" Daily Activity  Outcome Measure Difficulty turning over in bed (including adjusting bedclothes, sheets and blankets)?: None Difficulty moving from lying on back to sitting on the side of the bed? : None Difficulty sitting down on and standing up from a chair with arms (e.g., wheelchair, bedside commode, etc,.)?: None Help needed moving to and from a bed to chair (including a wheelchair)?: None Help needed walking in hospital room?: A Lot Help needed climbing 3-5 steps with a railing? : Total 6 Click Score: 19    End of Session Equipment Utilized During Treatment: Oxygen Activity Tolerance: Patient limited by fatigue;Treatment limited secondary to medical complications (Comment) Patient left:  (on [facility] with sister in room)   PT Visit Diagnosis: Difficulty in walking, not elsewhere classified (R26.2)    Time: 4599-7741 PT Time Calculation (min) (ACUTE ONLY): 12 min   Charges:   PT Evaluation $PT Eval High Complexity: 1 Procedure     PT G Codes:        12:43 PM, 31-Oct-2016 Etta Grandchild, PT, DPT Physical Therapist - [place] [phone] [facility])  [phone] (mobile)   Buccola,Allan C Oct 31, 2016, 12:34 PM

## 2016-10-16 NOTE — Telephone Encounter (Signed)
Ok they should be opening the schedule soon. Thanks

## 2016-10-17 LAB — PROTIME-INR
INR: 3.23
PROTHROMBIN TIME: 33.7 s — AB (ref 11.4–15.2)

## 2016-10-17 MED ORDER — WARFARIN - PHARMACIST DOSING INPATIENT
Freq: Every day | Status: DC
  Administered 2016-10-18 – 2016-10-22 (×3)

## 2016-10-17 MED ORDER — ALBUTEROL SULFATE (2.5 MG/3ML) 0.083% IN NEBU
2.5000 mg | INHALATION_SOLUTION | Freq: Four times a day (QID) | RESPIRATORY_TRACT | Status: DC
  Administered 2016-10-17 – 2016-10-20 (×13): 2.5 mg via RESPIRATORY_TRACT
  Filled 2016-10-17 (×14): qty 3

## 2016-10-17 MED ORDER — METHYLPREDNISOLONE SODIUM SUCC 40 MG IJ SOLR
40.0000 mg | Freq: Four times a day (QID) | INTRAMUSCULAR | Status: DC
  Administered 2016-10-17 – 2016-10-18 (×5): 40 mg via INTRAVENOUS
  Filled 2016-10-17 (×5): qty 1

## 2016-10-17 NOTE — Progress Notes (Signed)
[facility] at [place] NAME: Damaris Geers    MR#:  [id number]  DATE OF BIRTH:  [birth year]  SUBJECTIVE:  CHIEF COMPLAINT:   Chief Complaint  Patient presents with  . Respiratory Distress   Better cough and shortness breath but still wheezing, hypoxia on Exertion, on oxygen 2 L by nasal cannula now. REVIEW OF SYSTEMS:  Review of Systems  Constitutional: Positive for malaise/fatigue. Negative for chills and fever.  HENT: Negative for congestion.   Eyes: Negative for blurred vision and double vision.  Respiratory: Positive for cough, shortness of breath and wheezing. Negative for hemoptysis, sputum production and stridor.   Cardiovascular: Negative for chest pain, palpitations and leg swelling.  Gastrointestinal: Negative for abdominal pain, blood in stool, constipation, diarrhea, melena, nausea and vomiting.  Genitourinary: Negative for dysuria and hematuria.  Musculoskeletal: Negative for back pain.  Neurological: Negative for dizziness, focal weakness, loss of consciousness and weakness.  Psychiatric/Behavioral: Negative for depression. The patient is not nervous/anxious.     DRUG ALLERGIES:  No Known Allergies VITALS:  Blood pressure 139/71, pulse 76, temperature 97.7 F (36.5 C), temperature source Oral, resp. rate 18, height 6\' 1"  (1.854 m), weight 151 lb 8 oz (68.7 kg), SpO2 97 %. PHYSICAL EXAMINATION:  Physical Exam  Constitutional: He is oriented to person, place, and time and well-developed, well-nourished, and in no distress.  HENT:  Head: Normocephalic.  Mouth/Throat: Oropharynx is clear and moist.  Eyes: Conjunctivae and EOM are normal. No scleral icterus.  Neck: Normal range of motion. Neck supple. No JVD present. No tracheal deviation present.  Cardiovascular: Normal rate, regular rhythm and normal heart sounds.  Exam reveals no gallop.   No murmur heard. Pulmonary/Chest: Effort normal. No respiratory distress. He has  wheezes. He has no rales.  Musculoskeletal: Normal range of motion. He exhibits no edema or tenderness.  Right AKA.  Neurological: He is alert and oriented to person, place, and time. No cranial nerve deficit.  Skin: No rash noted. No erythema.  Psychiatric: Affect normal.   LABORATORY PANEL:  Male CBC  Recent Labs Lab 10/15/16 0336  WBC 16.0*  HGB 13.1  HCT 38.3*  PLT 219   ------------------------------------------------------------------------------------------------------------------ Chemistries   Recent Labs Lab 10/13/16 1958  10/15/16 0336  NA 130*  < > 133*  K 4.2  < > 3.9  CL 94*  < > 99*  CO2 26  < > 27  GLUCOSE 154*  < > 120*  BUN 14  < > 15  CREATININE 1.07  < > 0.69  CALCIUM 9.3  < > 9.1  MG 1.8  --  1.8  AST 43*  --   --   ALT 25  --   --   ALKPHOS 89  --   --   BILITOT 1.0  --   --   < > = values in this interval not displayed. RADIOLOGY:  No results found. ASSESSMENT AND PLAN:   Acute respiratory failure with hypoxia and hypercapnia (HCC) - due to COPD exacerbation,  Prn BiPAP, continue O2 [place] 2L. He was on IV solumedrol, changed to prednisone,  continue IV Solu-Medrol, continue duonebs, Spiriva, doxycycline.    Essential hypertension, benign - continue home meds   Chronic systolic CHF (congestive heart failure) ([place]) - continue home medications   AF (paroxysmal atrial fibrillation) ([place]) - home rate controlling meds and anticoagulation, Coumadin level is therapeutic.  CAD and PVD (peripheral vascular disease) ([place]) - home meds and  anticoagulation  Hyponatremia. Improving.  PT suggest home health and PT All the records are reviewed and case discussed with Care Management/Social Worker. Management plans discussed with the patient, his sister and they are in agreement.  CODE STATUS: Full Code  TOTAL TIME TAKING CARE OF THIS PATIENT: 36 minutes.   More than 50% of the time was spent in counseling/coordination of care: YES  POSSIBLE D/C IN 2  DAYS, DEPENDING ON CLINICAL CONDITION.   Demetrios Loll M.D on 10/17/2016 at 1:28 PM  Between 7am to 6pm - Pager - [phone]  After 6pm go to [email or web address] - Proofreader  [facility] [place] [facility]  Office  [phone]  CC: Primary care physician; Madelyn Brunner, MD  Note: This dictation was prepared with Dragon dictation along with smaller phrase technology. Any transcriptional errors that result from this process are unintentional.

## 2016-10-17 NOTE — Progress Notes (Signed)
ANTICOAGULATION CONSULT NOTE - Follow UP  Consult  Pharmacy Consult for warfarin dosing Indication: atrial fibrillation  No Known Allergies  Patient Measurements: Height: 6\' 1"  (185.4 cm) Weight: 151 lb 8 oz (68.7 kg) IBW/kg (Calculated) : 79.9 Heparin Dosing Weight:   Vital Signs: Temp: 97.7 F (36.5 C) (05/26 0845) Temp Source: Oral (05/26 0845) BP: 139/71 (05/26 0845) Pulse Rate: 76 (05/26 0845)  Labs:  Recent Labs  10/15/16 0336 10/16/16 0410 10/17/16 0503  HGB 13.1  --   --   HCT 38.3*  --   --   PLT 219  --   --   LABPROT 26.0* 29.1* 33.7*  INR 2.33 2.69 3.23  CREATININE 0.69  --   --     Estimated Creatinine Clearance: 94.2 mL/min (by C-G formula based on SCr of 0.69 mg/dL).   Medical History: Past Medical History:  Diagnosis Date  . AICD (automatic cardioverter/defibrillator) present   . Atrial fibrillation ([place])   . CHF (congestive heart failure) ([place])   . COPD (chronic obstructive pulmonary disease) ([place])   . Coronary artery disease   . Hypertension   . Myocardial infarction ([place])   . Peripheral vascular disease ([place])   . Presence of permanent cardiac pacemaker     Medications:  Scheduled:  . albuterol  2.5 mg Nebulization Q6H  . ALPRAZolam  1 mg Oral QHS  . aspirin EC  81 mg Oral Daily  . chlorhexidine  15 mL Mouth Rinse BID  . docusate sodium  100 mg Oral BID  . doxycycline  100 mg Oral Q12H  . methylPREDNISolone (SOLU-MEDROL) injection  40 mg Intravenous Q6H  . metoprolol succinate  50 mg Oral Q breakfast  . mometasone-formoterol  2 puff Inhalation BID  . pantoprazole  40 mg Oral Daily  . pneumococcal 23 valent vaccine  0.5 mL Intramuscular Tomorrow-1000  . pravastatin  20 mg Oral q1800  . ramipril  10 mg Oral Daily  . tiotropium  1 capsule Inhalation Daily  . warfarin  5 mg Oral q1800    Assessment: Patient admitted w/ SOB and is anticoagulated w/ warfarin for chronic afib. Home dose per med rec: warfarin 5 mg daily at 6  pm  5/23 INR 2.56    5 mg 5/24: INR: 2.33  5 mg 5/25: INR: 2.69 5 mg 5/26: INR; 3.23  Goal of Therapy:  INR 2-3 Monitor platelets by anticoagulation protocol: Yes   Plan:  INR supratherapeutic this am. Will Hold dose today with likey resumption of lower dose while on doxycycline. Patient on Doxycycline  Chinita Greenland PharmD Clinical Pharmacist 10/17/2016

## 2016-10-18 LAB — CULTURE, BLOOD (ROUTINE X 2)
Culture: NO GROWTH
Culture: NO GROWTH
SPECIAL REQUESTS: ADEQUATE
SPECIAL REQUESTS: ADEQUATE

## 2016-10-18 LAB — PROTIME-INR
INR: 3.08
Prothrombin Time: 32.5 seconds — ABNORMAL HIGH (ref 11.4–15.2)

## 2016-10-18 LAB — CBC
HEMATOCRIT: 41.3 % (ref 40.0–52.0)
Hemoglobin: 14 g/dL (ref 13.0–18.0)
MCH: 29.6 pg (ref 26.0–34.0)
MCHC: 33.9 g/dL (ref 32.0–36.0)
MCV: 87.4 fL (ref 80.0–100.0)
Platelets: 243 10*3/uL (ref 150–440)
RBC: 4.73 MIL/uL (ref 4.40–5.90)
RDW: 13.6 % (ref 11.5–14.5)
WBC: 11.7 10*3/uL — AB (ref 3.8–10.6)

## 2016-10-18 MED ORDER — MAGNESIUM SULFATE 2 GM/50ML IV SOLN
2.0000 g | Freq: Once | INTRAVENOUS | Status: AC
  Administered 2016-10-18: 2 g via INTRAVENOUS
  Filled 2016-10-18: qty 50

## 2016-10-18 MED ORDER — WARFARIN SODIUM 3 MG PO TABS
3.0000 mg | ORAL_TABLET | Freq: Once | ORAL | Status: AC
  Administered 2016-10-18: 17:00:00 3 mg via ORAL
  Filled 2016-10-18: qty 1

## 2016-10-18 MED ORDER — METOPROLOL SUCCINATE ER 50 MG PO TB24
100.0000 mg | ORAL_TABLET | Freq: Every day | ORAL | Status: DC
  Administered 2016-10-19 – 2016-10-23 (×5): 100 mg via ORAL
  Filled 2016-10-18 (×5): qty 2

## 2016-10-18 MED ORDER — METHYLPREDNISOLONE SODIUM SUCC 125 MG IJ SOLR
60.0000 mg | Freq: Four times a day (QID) | INTRAMUSCULAR | Status: DC
  Administered 2016-10-18 – 2016-10-20 (×9): 60 mg via INTRAVENOUS
  Filled 2016-10-18 (×9): qty 2

## 2016-10-18 NOTE — Progress Notes (Addendum)
[facility] [facility] at [place] NAME: Tyler [name]    MR#:  [id number]  DATE OF BIRTH:  August 28, 1955  SUBJECTIVE:  CHIEF COMPLAINT:   Chief Complaint  Patient presents with  . Respiratory Distress   Worsening wheezing, cough and shortness breath, hypoxia on Exertion, on oxygen 2 L by nasal cannula. The patient is anxious. REVIEW OF SYSTEMS:  Review of Systems  Constitutional: Positive for malaise/fatigue. Negative for chills and fever.  HENT: Negative for congestion.   Eyes: Negative for blurred vision and double vision.  Respiratory: Positive for cough, shortness of breath and wheezing. Negative for hemoptysis, sputum production and stridor.   Cardiovascular: Negative for chest pain, palpitations and leg swelling.  Gastrointestinal: Negative for abdominal pain, blood in stool, constipation, diarrhea, melena, nausea and vomiting.  Genitourinary: Negative for dysuria and hematuria.  Musculoskeletal: Negative for back pain.  Neurological: Negative for dizziness, focal weakness, loss of consciousness and weakness.  Psychiatric/Behavioral: Negative for depression. The patient is nervous/anxious.     DRUG ALLERGIES:  No Known Allergies VITALS:  Blood pressure (!) 169/83, pulse 75, temperature 97.8 F (36.6 C), temperature source Oral, resp. rate 20, height 6\' 1"  (1.854 m), weight 154 lb 5 oz (70 kg), SpO2 100 %. PHYSICAL EXAMINATION:  Physical Exam  Constitutional: He is oriented to person, place, and time and well-developed, well-nourished, and in no distress.  HENT:  Head: Normocephalic.  Mouth/Throat: Oropharynx is clear and moist.  Eyes: Conjunctivae and EOM are normal. No scleral icterus.  Neck: Normal range of motion. Neck supple. No JVD present. No tracheal deviation present.  Cardiovascular: Normal rate, regular rhythm and normal heart sounds.  Exam reveals no gallop.   No murmur heard. Pulmonary/Chest: Effort normal. No respiratory  distress. He has wheezes. He has no rales.  Musculoskeletal: Normal range of motion. He exhibits no edema or tenderness.  Right AKA.  Neurological: He is alert and oriented to person, place, and time. No cranial nerve deficit.  Skin: No rash noted. No erythema.  Psychiatric: Affect normal.   LABORATORY PANEL:  Male CBC  Recent Labs Lab 10/18/16 0447  WBC 11.7*  HGB 14.0  HCT 41.3  PLT 243   ------------------------------------------------------------------------------------------------------------------ Chemistries   Recent Labs Lab 10/13/16 1958  10/15/16 0336  NA 130*  < > 133*  K 4.2  < > 3.9  CL 94*  < > 99*  CO2 26  < > 27  GLUCOSE 154*  < > 120*  BUN 14  < > 15  CREATININE 1.07  < > 0.69  CALCIUM 9.3  < > 9.1  MG 1.8  --  1.8  AST 43*  --   --   ALT 25  --   --   ALKPHOS 89  --   --   BILITOT 1.0  --   --   < > = values in this interval not displayed. RADIOLOGY:  No results found. ASSESSMENT AND PLAN:   Acute respiratory failure with hypoxia and hypercapnia (HCC) - due to COPD exacerbation,  Prn BiPAP, continue O2 [place] 2L. He was on IV solumedrol, changed to prednisone,  continue IV Solu-Medrol 60 mg q6h, continue duonebs, Spiriva, doxycycline. Pulmonary consult.    Essential hypertension, benign - continue home meds   Chronic systolic CHF (congestive heart failure) ([place]) - continue home medications   AF (paroxysmal atrial fibrillation) ([place]) - home rate controlling meds and anticoagulation, Coumadin level is therapeutic.  CAD and PVD (peripheral vascular  disease) ([place]) - home meds and anticoagulation  Hyponatremia. Improving. Anxiety. Xanax When necessary. PT suggest home health and PT  All the records are reviewed and case discussed with Care Management/Social Worker. Management plans discussed with the patient, his sister and son and they are in agreement.  CODE STATUS: Full Code  TOTAL TIME TAKING CARE OF THIS PATIENT: 36 minutes.   More than  50% of the time was spent in counseling/coordination of care: YES  POSSIBLE D/C IN 2 DAYS, DEPENDING ON CLINICAL CONDITION.   Demetrios Loll M.D on 10/18/2016 at 11:34 AM  Between 7am to 6pm - Pager - [phone]  After 6pm go to [email or web address] - Proofreader  [facility]  Office  [phone]  CC: Primary care physician; Madelyn Brunner, MD  Note: This dictation was prepared with Dragon dictation along with smaller phrase technology. Any transcriptional errors that result from this process are unintentional.

## 2016-10-18 NOTE — Progress Notes (Signed)
ANTICOAGULATION CONSULT NOTE - Follow UP  Consult  Pharmacy Consult for warfarin dosing Indication: atrial fibrillation  No Known Allergies  Patient Measurements: Height: 6\' 1"  (185.4 cm) Weight: 154 lb 5 oz (70 kg) IBW/kg (Calculated) : 79.9 Heparin Dosing Weight:   Vital Signs: Temp: 97.8 F (36.6 C) (05/27 0621) Temp Source: Oral (05/27 0621) BP: 155/84 (05/27 0621) Pulse Rate: 74 (05/27 0621)  Labs:  Recent Labs  10/16/16 0410 10/17/16 0503 10/18/16 0447  HGB  --   --  14.0  HCT  --   --  41.3  PLT  --   --  243  LABPROT 29.1* 33.7* 32.5*  INR 2.69 3.23 3.08    Estimated Creatinine Clearance: 96 mL/min (by C-G formula based on SCr of 0.69 mg/dL).   Medical History: Past Medical History:  Diagnosis Date  . AICD (automatic cardioverter/defibrillator) present   . Atrial fibrillation ([place])   . CHF (congestive heart failure) ([place])   . COPD (chronic obstructive pulmonary disease) ([place])   . Coronary artery disease   . Hypertension   . Myocardial infarction ([place])   . Peripheral vascular disease ([place])   . Presence of permanent cardiac pacemaker     Medications:  Scheduled:  . albuterol  2.5 mg Nebulization Q6H  . ALPRAZolam  1 mg Oral QHS  . aspirin EC  81 mg Oral Daily  . chlorhexidine  15 mL Mouth Rinse BID  . docusate sodium  100 mg Oral BID  . doxycycline  100 mg Oral Q12H  . methylPREDNISolone (SOLU-MEDROL) injection  40 mg Intravenous Q6H  . metoprolol succinate  50 mg Oral Q breakfast  . mometasone-formoterol  2 puff Inhalation BID  . pantoprazole  40 mg Oral Daily  . pneumococcal 23 valent vaccine  0.5 mL Intramuscular Tomorrow-1000  . pravastatin  20 mg Oral q1800  . ramipril  10 mg Oral Daily  . tiotropium  1 capsule Inhalation Daily  . Warfarin - Pharmacist Dosing Inpatient   Does not apply q1800    Assessment: Patient admitted w/ SOB and is anticoagulated w/ warfarin for chronic afib. Home dose per med rec: warfarin 5 mg daily at 6  pm  5/23 INR 2.56    5 mg 5/24: INR: 2.33  5 mg 5/25: INR: 2.69 5 mg 5/26: INR; 3.23 Dose HELD 5/27: INR: 3.08  Goal of Therapy:  INR 2-3 Monitor platelets by anticoagulation protocol: Yes   Plan:  Will give Warfarin 3 mg x 1 tonight.  Patient on Doxycycline  Chinita Greenland PharmD Clinical Pharmacist 10/18/2016

## 2016-10-19 LAB — BLOOD GAS, VENOUS
ACID-BASE DEFICIT: 4.4 mmol/L — AB (ref 0.0–2.0)
Bicarbonate: 25.2 mmol/L (ref 20.0–28.0)
Delivery systems: POSITIVE
PATIENT TEMPERATURE: 37
PH VEN: 7.19 — AB (ref 7.250–7.430)
pCO2, Ven: 66 mmHg — ABNORMAL HIGH (ref 44.0–60.0)
pO2, Ven: <31 mmHg — CL (ref 32.0–45.0)

## 2016-10-19 LAB — PROTIME-INR
INR: 2.48
Prothrombin Time: 27.3 seconds — ABNORMAL HIGH (ref 11.4–15.2)

## 2016-10-19 MED ORDER — WARFARIN SODIUM 5 MG PO TABS
5.0000 mg | ORAL_TABLET | Freq: Once | ORAL | Status: AC
  Administered 2016-10-19: 5 mg via ORAL
  Filled 2016-10-19: qty 1

## 2016-10-19 MED ORDER — MAGNESIUM HYDROXIDE 400 MG/5ML PO SUSP
30.0000 mL | Freq: Every day | ORAL | Status: AC | PRN
  Administered 2016-10-19 – 2016-10-23 (×2): 30 mL via ORAL
  Filled 2016-10-19 (×2): qty 30

## 2016-10-19 NOTE — Progress Notes (Addendum)
Patient refuses bed alarm. Patient re-educated on purpose of alarm.

## 2016-10-19 NOTE — Progress Notes (Signed)
ANTICOAGULATION CONSULT NOTE - Follow UP  Consult  Pharmacy Consult for warfarin dosing Indication: atrial fibrillation  No Known Allergies  Patient Measurements: Height: 6\' 1"  (185.4 cm) Weight: 154 lb 8 oz (70.1 kg) IBW/kg (Calculated) : 79.9 Heparin Dosing Weight:   Vital Signs: Temp: 97.3 F (36.3 C) (05/28 0425) Temp Source: Axillary (05/28 0425) BP: 132/79 (05/28 0425) Pulse Rate: 76 (05/28 0425)  Labs:  Recent Labs  10/17/16 0503 10/18/16 0447 10/19/16 0438  HGB  --  14.0  --   HCT  --  41.3  --   PLT  --  243  --   LABPROT 33.7* 32.5* 27.3*  INR 3.23 3.08 2.48    Estimated Creatinine Clearance: 96.1 mL/min (by C-G formula based on SCr of 0.69 mg/dL).   Medical History: Past Medical History:  Diagnosis Date  . AICD (automatic cardioverter/defibrillator) present   . Atrial fibrillation ([place])   . CHF (congestive heart failure) ([place])   . COPD (chronic obstructive pulmonary disease) ([place])   . Coronary artery disease   . Hypertension   . Myocardial infarction ([place])   . Peripheral vascular disease ([place])   . Presence of permanent cardiac pacemaker     Medications:  Scheduled:  . albuterol  2.5 mg Nebulization Q6H  . ALPRAZolam  1 mg Oral QHS  . aspirin EC  81 mg Oral Daily  . chlorhexidine  15 mL Mouth Rinse BID  . docusate sodium  100 mg Oral BID  . methylPREDNISolone (SOLU-MEDROL) injection  60 mg Intravenous Q6H  . metoprolol succinate  100 mg Oral Q breakfast  . mometasone-formoterol  2 puff Inhalation BID  . pantoprazole  40 mg Oral Daily  . pneumococcal 23 valent vaccine  0.5 mL Intramuscular Tomorrow-1000  . pravastatin  20 mg Oral q1800  . ramipril  10 mg Oral Daily  . tiotropium  1 capsule Inhalation Daily  . Warfarin - Pharmacist Dosing Inpatient   Does not apply q1800    Assessment: Patient admitted w/ SOB and is anticoagulated w/ warfarin for chronic afib. Home dose per med rec: warfarin 5 mg daily at 6 pm  5/23 INR 2.56    5  mg 5/24: INR: 2.33  5 mg 5/25: INR: 2.69 5 mg 5/26: INR; 3.23            HOLD 5/27: INR: 3.08           3 mg  5/28: INR: 2.48           5 mg Goal of Therapy:  INR 2-3 Monitor platelets by anticoagulation protocol: Yes   Plan:  INR therapeutic. Will give home dose of warfarin 5 mg  Larene Beach, PharmD  Clinical Pharmacist 10/19/2016

## 2016-10-19 NOTE — Progress Notes (Signed)
[facility] at [place] NAME: Tyler [name]    MR#:  [id number]  DATE OF BIRTH:  [birth year]  SUBJECTIVE:  CHIEF COMPLAINT:   Chief Complaint  Patient presents with  . Respiratory Distress   still wheezing, mild cough and shortness breath, hypoxia on Exertion, on oxygen 2 L by nasal cannula. The patient is anxious. REVIEW OF SYSTEMS:  Review of Systems  Constitutional: Positive for malaise/fatigue. Negative for chills and fever.  HENT: Negative for congestion.   Eyes: Negative for blurred vision and double vision.  Respiratory: Positive for cough, shortness of breath and wheezing. Negative for hemoptysis, sputum production and stridor.   Cardiovascular: Negative for chest pain, palpitations and leg swelling.  Gastrointestinal: Negative for abdominal pain, blood in stool, constipation, diarrhea, melena, nausea and vomiting.  Genitourinary: Negative for dysuria and hematuria.  Musculoskeletal: Negative for back pain.  Neurological: Negative for dizziness, focal weakness, loss of consciousness and weakness.  Psychiatric/Behavioral: Negative for depression. The patient is nervous/anxious.     DRUG ALLERGIES:  No Known Allergies VITALS:  Blood pressure 132/79, pulse 76, temperature 97.3 F (36.3 C), temperature source Axillary, resp. rate 20, height 6\' 1"  (1.854 m), weight 154 lb 8 oz (70.1 kg), SpO2 100 %. PHYSICAL EXAMINATION:  Physical Exam  Constitutional: He is oriented to person, place, and time and well-developed, well-nourished, and in no distress.  HENT:  Head: Normocephalic.  Mouth/Throat: Oropharynx is clear and moist.  Eyes: Conjunctivae and EOM are normal. No scleral icterus.  Neck: Normal range of motion. Neck supple. No JVD present. No tracheal deviation present.  Cardiovascular: Normal rate, regular rhythm and normal heart sounds.  Exam reveals no gallop.   No murmur heard. Pulmonary/Chest: Effort normal. No respiratory  distress. He has wheezes. He has no rales.  Bilateral moderate to severe expiratory wheezing  Musculoskeletal: Normal range of motion. He exhibits no edema or tenderness.  Right AKA.  Neurological: He is alert and oriented to person, place, and time. No cranial nerve deficit.  Skin: No rash noted. No erythema.  Psychiatric: Affect normal.   LABORATORY PANEL:  Male CBC  Recent Labs Lab 10/18/16 0447  WBC 11.7*  HGB 14.0  HCT 41.3  PLT 243   ------------------------------------------------------------------------------------------------------------------ Chemistries   Recent Labs Lab 10/13/16 1958  10/15/16 0336  NA 130*  < > 133*  K 4.2  < > 3.9  CL 94*  < > 99*  CO2 26  < > 27  GLUCOSE 154*  < > 120*  BUN 14  < > 15  CREATININE 1.07  < > 0.69  CALCIUM 9.3  < > 9.1  MG 1.8  --  1.8  AST 43*  --   --   ALT 25  --   --   ALKPHOS 89  --   --   BILITOT 1.0  --   --   < > = values in this interval not displayed. RADIOLOGY:  No results found. ASSESSMENT AND PLAN:   Acute respiratory failure with hypoxia and hypercapnia (HCC) - due to COPD exacerbation,  Prn BiPAP, continue O2 [place] 2L. He was on IV solumedrol, changed to prednisone,  continue IV Solu-Medrol 60 mg q6h, continue duonebs, dulera, Spiriva, doxycycline. Follow-up Pulmonary consult.    Essential hypertension, benign - continue home meds   Chronic systolic CHF (congestive heart failure) ([place]) - continue home medications   AF (paroxysmal atrial fibrillation) ([place]) - home rate controlling meds and anticoagulation, Coumadin  level is therapeutic.  CAD and PVD (peripheral vascular disease) ([place]) - home meds and anticoagulation  Hyponatremia. Improving. Anxiety. Xanax When necessary. PT suggest home health and PT  All the records are reviewed and case discussed with Care Management/Social Worker. Management plans discussed with the patient, his wife, and they are in agreement.  CODE STATUS: Full Code  TOTAL  TIME TAKING CARE OF THIS PATIENT: 36 minutes.   More than 50% of the time was spent in counseling/coordination of care: YES  POSSIBLE D/C IN 2 DAYS, DEPENDING ON CLINICAL CONDITION.   Tyler [name] M.D on 10/19/2016 at 11:57 AM  Between 7am to 6pm - Pager - [phone]  After 6pm go to [email or web address] - Proofreader  [facility]  Office  [phone]  CC: Primary care physician; Tyler Brunner, MD  Note: This dictation was prepared with Dragon dictation along with smaller phrase technology. Any transcriptional errors that result from this process are unintentional.

## 2016-10-19 NOTE — Plan of Care (Signed)
Problem: Activity: Goal: Risk for activity intolerance will decrease Outcome: Not Progressing Pt continues to get SOB with exertion   Problem: Activity: Goal: Ability to tolerate increased activity will improve Outcome: Not Progressing Pt unable to tolerate much activity d/t respiratory status

## 2016-10-20 DIAGNOSIS — J411 Mucopurulent chronic bronchitis: Secondary | ICD-10-CM

## 2016-10-20 DIAGNOSIS — J9622 Acute and chronic respiratory failure with hypercapnia: Secondary | ICD-10-CM

## 2016-10-20 DIAGNOSIS — J9621 Acute and chronic respiratory failure with hypoxia: Secondary | ICD-10-CM

## 2016-10-20 LAB — PROTIME-INR
INR: 2.86
Prothrombin Time: 30.6 seconds — ABNORMAL HIGH (ref 11.4–15.2)

## 2016-10-20 LAB — FIBRIN DERIVATIVES D-DIMER (ARMC ONLY): FIBRIN DERIVATIVES D-DIMER (ARMC): 155.38 (ref 0.00–499.00)

## 2016-10-20 MED ORDER — BUDESONIDE 0.5 MG/2ML IN SUSP
0.5000 mg | Freq: Two times a day (BID) | RESPIRATORY_TRACT | Status: DC
  Administered 2016-10-20 – 2016-10-23 (×6): 0.5 mg via RESPIRATORY_TRACT
  Filled 2016-10-20 (×6): qty 2

## 2016-10-20 MED ORDER — IPRATROPIUM-ALBUTEROL 0.5-2.5 (3) MG/3ML IN SOLN
3.0000 mL | RESPIRATORY_TRACT | Status: DC | PRN
  Administered 2016-10-22 (×2): 3 mL via RESPIRATORY_TRACT
  Filled 2016-10-20 (×2): qty 3

## 2016-10-20 MED ORDER — METHYLPREDNISOLONE SODIUM SUCC 40 MG IJ SOLR
40.0000 mg | Freq: Two times a day (BID) | INTRAMUSCULAR | Status: DC
  Administered 2016-10-21 – 2016-10-23 (×5): 40 mg via INTRAVENOUS
  Filled 2016-10-20 (×5): qty 1

## 2016-10-20 MED ORDER — WARFARIN SODIUM 1 MG PO TABS
1.5000 mg | ORAL_TABLET | Freq: Once | ORAL | Status: AC
  Administered 2016-10-20: 1.5 mg via ORAL
  Filled 2016-10-20: qty 2

## 2016-10-20 MED ORDER — IPRATROPIUM-ALBUTEROL 0.5-2.5 (3) MG/3ML IN SOLN
3.0000 mL | RESPIRATORY_TRACT | Status: DC
  Administered 2016-10-20: 16:00:00 3 mL via RESPIRATORY_TRACT
  Filled 2016-10-20: qty 3

## 2016-10-20 MED ORDER — GUAIFENESIN ER 600 MG PO TB12
600.0000 mg | ORAL_TABLET | Freq: Two times a day (BID) | ORAL | Status: DC
  Administered 2016-10-20 – 2016-10-23 (×7): 600 mg via ORAL
  Filled 2016-10-20 (×7): qty 1

## 2016-10-20 MED ORDER — BUDESONIDE 0.25 MG/2ML IN SUSP
0.2500 mg | Freq: Two times a day (BID) | RESPIRATORY_TRACT | Status: DC
  Administered 2016-10-20: 0.25 mg via RESPIRATORY_TRACT
  Filled 2016-10-20: qty 2

## 2016-10-20 MED ORDER — LEVOFLOXACIN 500 MG PO TABS
500.0000 mg | ORAL_TABLET | Freq: Every day | ORAL | Status: DC
  Administered 2016-10-20 – 2016-10-22 (×3): 500 mg via ORAL
  Filled 2016-10-20 (×3): qty 1

## 2016-10-20 MED ORDER — WARFARIN SODIUM 1 MG PO TABS: 3.0000 mg | ORAL_TABLET | Freq: Once | ORAL | Status: DC

## 2016-10-20 MED ORDER — RAMIPRIL 10 MG PO CAPS
10.0000 mg | ORAL_CAPSULE | Freq: Two times a day (BID) | ORAL | Status: DC
  Administered 2016-10-20 – 2016-10-23 (×6): 10 mg via ORAL
  Filled 2016-10-20 (×6): qty 1

## 2016-10-20 NOTE — Progress Notes (Signed)
ANTICOAGULATION CONSULT NOTE - Follow UP  Consult  Pharmacy Consult for warfarin dosing Indication: atrial fibrillation  No Known Allergies  Patient Measurements: Height: 6\' 1"  (185.4 cm) Weight: 161 lb 8 oz (73.3 kg) IBW/kg (Calculated) : 79.9 Heparin Dosing Weight:   Vital Signs: Temp: 97.1 F (36.2 C) (05/29 0402) BP: 146/83 (05/29 0913) Pulse Rate: 86 (05/29 0913)  Labs:  Recent Labs  10/18/16 0447 10/19/16 0438 10/20/16 0403  HGB 14.0  --   --   HCT 41.3  --   --   PLT 243  --   --   LABPROT 32.5* 27.3* 30.6*  INR 3.08 2.48 2.86    Estimated Creatinine Clearance: 100.5 mL/min (by C-G formula based on SCr of 0.69 mg/dL).   Medical History: Past Medical History:  Diagnosis Date  . AICD (automatic cardioverter/defibrillator) present   . Atrial fibrillation ([place])   . CHF (congestive heart failure) ([place])   . COPD (chronic obstructive pulmonary disease) ([place])   . Coronary artery disease   . Hypertension   . Myocardial infarction ([place])   . Peripheral vascular disease ([place])   . Presence of permanent cardiac pacemaker     Medications:  Scheduled:  . albuterol  2.5 mg Nebulization Q6H  . ALPRAZolam  1 mg Oral QHS  . aspirin EC  81 mg Oral Daily  . budesonide (PULMICORT) nebulizer solution  0.25 mg Nebulization BID  . chlorhexidine  15 mL Mouth Rinse BID  . docusate sodium  100 mg Oral BID  . guaiFENesin  600 mg Oral BID  . methylPREDNISolone (SOLU-MEDROL) injection  60 mg Intravenous Q6H  . metoprolol succinate  100 mg Oral Q breakfast  . pantoprazole  40 mg Oral Daily  . pneumococcal 23 valent vaccine  0.5 mL Intramuscular Tomorrow-1000  . pravastatin  20 mg Oral q1800  . ramipril  10 mg Oral Daily  . tiotropium  1 capsule Inhalation Daily  . Warfarin - Pharmacist Dosing Inpatient   Does not apply q1800    Assessment: Patient admitted w/ SOB and is anticoagulated w/ warfarin for chronic afib. Home dose per med rec: warfarin 5 mg daily at 6 pm  5/23  INR 2.56    5 mg 5/24: INR: 2.33  5 mg 5/25: INR: 2.69 5 mg 5/26: INR; 3.23            HOLD 5/27: INR: 3.08           3 mg  5/28: INR: 2.48           5 mg 5/28  INR: 2.86 3mg  Goal of Therapy:  INR 2-3 Monitor platelets by anticoagulation protocol: Yes   Plan:  INR therapeutic, but did jump up .0.4. Will give warfarin 3mg  x 1 dose.   Pernell Dupre, PharmD, BCPS Clinical Pharmacist 10/20/2016 9:34 AM

## 2016-10-20 NOTE — Progress Notes (Signed)
[facility] at [place] NAME: Tyler [name]    MR#:  [id number]  DATE OF BIRTH:  [birth year]  SUBJECTIVE:  CHIEF COMPLAINT:   Chief Complaint  Patient presents with  . Respiratory Distress   still wheezing, mild cough and shortness breath, hypoxia on Exertion, on oxygen 2 L by nasal cannula. The patient does not use oxygen at home. He feels somewhat improved, however, not back to normal REVIEW OF SYSTEMS:  Review of Systems  Constitutional: Positive for malaise/fatigue. Negative for chills and fever.  HENT: Negative for congestion.   Eyes: Negative for blurred vision and double vision.  Respiratory: Positive for cough, shortness of breath and wheezing. Negative for hemoptysis, sputum production and stridor.   Cardiovascular: Negative for chest pain, palpitations and leg swelling.  Gastrointestinal: Negative for abdominal pain, blood in stool, constipation, diarrhea, melena, nausea and vomiting.  Genitourinary: Negative for dysuria and hematuria.  Musculoskeletal: Negative for back pain.  Neurological: Negative for dizziness, focal weakness, loss of consciousness and weakness.  Psychiatric/Behavioral: Negative for depression. The patient is nervous/anxious.     DRUG ALLERGIES:  No Known Allergies VITALS:  Blood pressure (!) 146/83, pulse 86, temperature 97.1 F (36.2 C), resp. rate (!) 22, height 6\' 1"  (1.854 m), weight 73.3 kg (161 lb 8 oz), SpO2 97 %. PHYSICAL EXAMINATION:  Physical Exam  Constitutional: He is oriented to person, place, and time and well-developed, well-nourished, and in no distress.  HENT:  Head: Normocephalic.  Mouth/Throat: Oropharynx is clear and moist.  Eyes: Conjunctivae and EOM are normal. No scleral icterus.  Neck: Normal range of motion. Neck supple. No JVD present. No tracheal deviation present.  Cardiovascular: Normal rate, regular rhythm and normal heart sounds.  Exam reveals no gallop.   No murmur  heard. Pulmonary/Chest: He is in respiratory distress. He has decreased breath sounds in the right upper field, the right middle field, the right lower field, the left upper field, the left middle field and the left lower field. He has wheezes. He has no rales.  Bilateral moderate to severe expiratory wheezing  Musculoskeletal: Normal range of motion. He exhibits no edema or tenderness.  Right AKA.  Neurological: He is alert and oriented to person, place, and time. No cranial nerve deficit.  Skin: No rash noted. No erythema.  Psychiatric: Affect normal.   LABORATORY PANEL:  Male CBC  Recent Labs Lab 10/18/16 0447  WBC 11.7*  HGB 14.0  HCT 41.3  PLT 243   ------------------------------------------------------------------------------------------------------------------ Chemistries   Recent Labs Lab 10/13/16 1958  10/15/16 0336  NA 130*  < > 133*  K 4.2  < > 3.9  CL 94*  < > 99*  CO2 26  < > 27  GLUCOSE 154*  < > 120*  BUN 14  < > 15  CREATININE 1.07  < > 0.69  CALCIUM 9.3  < > 9.1  MG 1.8  --  1.8  AST 43*  --   --   ALT 25  --   --   ALKPHOS 89  --   --   BILITOT 1.0  --   --   < > = values in this interval not displayed. RADIOLOGY:  No results found. ASSESSMENT AND PLAN:   Acute respiratory failure with hypoxia and hypercapnia (HCC) - due to COPD exacerbation,  Prn BiPAP,   at nighttime, continue O2 [place] 2L.  continue IV Solu-Medrol 60 mg q6h, continue duonebs, changed Dulera to budesonide nebulizers,  continue Spiriva, doxycycline. Following closely, weaning off oxygen as tolerated Follow-up Pulmonary consult.    Essential hypertension, benign - continue home meds, blood pressure is poorly controlled, likely due to stress, following closely and advance medications as needed    Chronic systolic CHF (congestive heart failure) ([place]) - continue home medications, advance Altace to twice daily dosing, follow clinically    AF (paroxysmal atrial fibrillation) ([place]) - home  rate controlling meds and anticoagulation, Coumadin level is therapeutic.   CAD and PVD (peripheral vascular disease) ([place]) - home meds and anticoagulation  Hyponatremia. Improving. Follow in the morning  Anxiety. Xanax When necessary. May need morphine, Roxanol  Generalized weakness, PT suggest home health and PT, to be arranged upon discharge  All the records are reviewed and case discussed with Care Management/Social Worker. Management plans discussed with the patient, his wife, and they are in agreement.  CODE STATUS: Full Code  TOTAL TIME TAKING CARE OF THIS PATIENT: 35 minutes.   More than 50% of the time was spent in counseling/coordination of care: YES  POSSIBLE D/C IN 2 DAYS, DEPENDING ON CLINICAL CONDITION.   Theodoro Grist M.D on 10/20/2016 at 11:12 AM  Between 7am to 6pm - Pager - [phone]  After 6pm go to [email or web address] - Proofreader  [facility]  Office  [phone]  CC: Primary care physician; Madelyn Brunner, MD  Note: This dictation was prepared with Dragon dictation along with smaller phrase technology. Any transcriptional errors that result from this process are unintentional.

## 2016-10-20 NOTE — Care Management (Signed)
Spoke with Tyler [name] at the bedside. States that he has been married to his wife Tyler [name] x 25 years this August [phone] or [phone]). Last seen Dr. Lisette Grinder 2 months ago. Prescriptions are filled at [facility] [place] or [facility] mail order. No home Health. No skilled facility. No home oxygen in the past. Uses crutches to aide in ambulation x 22 years. Shower chair and handles on the toilet seat. No falls. Decreased appetite. Takes care of all basic activities of daily living himself, drives.Requesting information about BiPap. Discussed that he would probably go home on oxygen when discharged. Will need over night oximetry on prescribed oxygen liter flow. ABG completed last night. PCO2 66. And rule out OSA with CPAP. Shelbie Ammons RN MSN CCM Care Management [phone]

## 2016-10-20 NOTE — Progress Notes (Signed)
Dr. Ether Griffins notified patient's oxygen saturations decreasing to 77-80% on 2L when pivoting from bed to bedside commode. Dr. Ether Griffins to get in touch with pulmonologist MD and order STAT D-dimer.

## 2016-10-20 NOTE — Progress Notes (Signed)
Physical Therapy Treatment Patient Details Name: Tyler [name] MRN: [id number] DOB: [birth year] Today's Date: 10/20/2016    History of Present Illness Tyler [name] is a 61yo white male who comes to [facility] on 5/22 with 4D SOB, admitted for acute respiratory failure and hypoxia/hypercapnia. PMH: Rt AKA (8Ya), AF c AICD/PPM, CHF, COPD, CAD, HTN, PVD. At baseline pt performs household distance AMB with AC. He performs ADL independently but requires significantly increased time due to severe SOB/DOe at baseline.     PT Comments    Pt continues to be very winded with minimal activity, but was able to stand statically for a few minutes on 2 liters and sats stayed in the high 90s.  He then did ~10 ft of ambulation and stats stayed in the 90s, after 30 more ft with crutches and 2 liters he did become fatigued and sats dropped to low 80s.  Pt generally safe and relatively confident with mobility/ambulation but activity tolerance remains very limited, elspecially compared to baseline. Pt should still be able to go home w/ HHPT once medically stable.  May need O2, etc per recommendations.    Follow Up Recommendations  Home health PT     Equipment Recommendations  None recommended by PT    Recommendations for Other Services       Precautions / Restrictions Precautions Precautions: Fall Restrictions Weight Bearing Restrictions: No RLE Weight Bearing:  (AKA)    Mobility  Bed Mobility Overal bed mobility: Modified Independent             General bed mobility comments: Pt able to get up/down to/from bed w/o assist, single UE assist  Transfers Overall transfer level: Needs assistance Equipment used: None Transfers: Sit to/from Stand Sit to Stand: Modified independent (Device/Increase time)         General transfer comment: Pt was able to rise to standing w/o assist and while holding onto bed rail was able to maintain standing balance for 2-3 minutes w/o assist and O2 staying in the  high 90s (on 2 liters)  Ambulation/Gait Ambulation/Gait assistance: Supervision Ambulation Distance (Feet): 40 Feet Assistive device: Crutches       General Gait Details: Pt did well with ambulation around foot of bed (multiple bouts) and though he had some fatigue he was able to remain active for longer than any effort in the last week.  His O2 did eventually drop to low 80s (on 2 liters) and though he is not at all near his baseline he did make considerable improvements with mobility.   Stairs            Wheelchair Mobility    Modified Rankin (Stroke Patients Only)       Balance Overall balance assessment: Modified Independent;No apparent balance deficits (not formally assessed)                                          Cognition Arousal/Alertness: Awake/alert Behavior During Therapy: WFL for tasks assessed/performed Overall Cognitive Status: Within Functional Limits for tasks assessed                                        Exercises      General Comments        Pertinent Vitals/Pain Pain Assessment: No/denies pain    Home  Living                      Prior Function            PT Goals (current goals can now be found in the care plan section) Progress towards PT goals: Progressing toward goals    Frequency    Min 2X/week      PT Plan Current plan remains appropriate    Co-evaluation              AM-PAC PT "6 Clicks" Daily Activity  Outcome Measure  Difficulty turning over in bed (including adjusting bedclothes, sheets and blankets)?: None Difficulty moving from lying on back to sitting on the side of the bed? : None Difficulty sitting down on and standing up from a chair with arms (e.g., wheelchair, bedside commode, etc,.)?: None Help needed moving to and from a bed to chair (including a wheelchair)?: None Help needed walking in hospital room?: A Lot Help needed climbing 3-5 steps with a  railing? : A Lot 6 Click Score: 20    End of Session Equipment Utilized During Treatment: Oxygen Activity Tolerance: Patient limited by fatigue Patient left: in bed;with call bell/phone within reach;with family/visitor present Nurse Communication: Mobility status PT Visit Diagnosis: Difficulty in walking, not elsewhere classified (R26.2)     Time: 8887-5797 PT Time Calculation (min) (ACUTE ONLY): 18 min  Charges:  $Gait Training: 8-22 mins                    G Codes:       Kreg Shropshire, DPT 10/20/2016, 5:25 PM

## 2016-10-20 NOTE — Progress Notes (Signed)
.     Name: Tyler [name] MRN: [id number] DOB: [birth year]    ADMISSION DATE:  10/13/2016  BRIEF PATIENT DESCRIPTION:  61 yo old male admitted with 05/22 with acute on chronic hypercapnic hypoxic respiratory failure secondary to AECOPD requiring intermittent BiPAP. He was seen in consultation by PCCM 05/24 and was transferred out of the intensive care unit 05/25. PCCM was asked to see again on 05/29 for increased dyspnea, wheezing, copious purulent sputum, hypoxemia   SUBJECTIVE:  He was making some improvement but has had a rough 24 hours with increased shortness of breath. He is bringing up copious purulent sputum. He has not had any fevers and denies chest pain. He denies hemoptysis.  VITAL SIGNS: Temp:  [97.1 F (36.2 C)-98.1 F (36.7 C)] 98.1 F (36.7 C) (05/29 1334) Pulse Rate:  [65-93] 77 (05/29 1334) Resp:  [16-24] 16 (05/29 1334) BP: (131-157)/(77-83) 131/77 (05/29 1334) SpO2:  [77 %-100 %] 93 % (05/29 1510) FiO2 (%):  [30 %] 30 % (05/29 0407) Weight:  [161 lb 8 oz (73.3 kg)] 161 lb 8 oz (73.3 kg) (05/29 0500)  PHYSICAL EXAMINATION: General: Mild dyspnea with speech Neuro: No focal deficits HEENT: NCAT, sclerae white Cardiovascular: Regular, no murmurs noted Lungs: Diminished breath sounds throughout, diffuse scattered wheezes and rhonchi Abdomen: Soft, nontender, bowel sounds present Extremities: No edema, status post left BKA   Recent Labs Lab 10/13/16 1958 10/14/16 0103 10/15/16 0336  NA 130* 132* 133*  K 4.2 3.8 3.9  CL 94* 97* 99*  CO2 26 25 27   BUN 14 16 15   CREATININE 1.07 0.78 0.69  GLUCOSE 154* 138* 120*    Recent Labs Lab 10/14/16 0103 10/15/16 0336 10/18/16 0447  HGB 13.6 13.1 14.0  HCT 40.7 38.3* 41.3  WBC 12.5* 16.0* 11.7*  PLT 221 219 243   No results found.   No recent chest x-ray  ASSESSMENT / PLAN: Acute on chronic hypoxic hypercapnic respiratory failure  AE COPD Purulent bronchitis with copious secretions Chronic Atrial  Fibrillation Hx: Pacemaker, HTN, CAD, AICD, and MI  P: Continue supplemental oxygen to maintain SPO2 greater than 90% Continue nebulized budesonide and arformoterol and inhaled Spiriva as his maintenance medications  This should be his discharge maintenance regimen I have changed DuoNeb to as needed to avoid overdosing on beta agonist therapy Continue methylprednisolone - dose adjusted Chest x-ray ordered and will be reviewed on the day following this evaluation Mucus clearance measures ordered - chest percussion test and flutter valve Sputum culture ordered Empiric levofloxacin, 500 mg daily for 7 days   I have already scheduled follow-up appointment with me in the office  Note to self: We discussed lung cancer screening. He was scheduled to have LDCT at [facility]. I suggested that he cancel this and we will reschedule it at our facility in the near future  Merton Border, MD PCCM service Mobile [phone] Pager [phone] 10/20/2016 5:32 PM

## 2016-10-20 NOTE — Progress Notes (Signed)
ANTICOAGULATION CONSULT NOTE - Follow UP  Consult  Pharmacy Consult for warfarin dosing Indication: atrial fibrillation  No Known Allergies  Patient Measurements: Height: 6\' 1"  (185.4 cm) Weight: 161 lb 8 oz (73.3 kg) IBW/kg (Calculated) : 79.9  Vital Signs: Temp: 98.1 F (36.7 C) (05/29 1334) Temp Source: Oral (05/29 1334) BP: 131/77 (05/29 1334) Pulse Rate: 77 (05/29 1334)  Labs:  Recent Labs  10/18/16 0447 10/19/16 0438 10/20/16 0403  HGB 14.0  --   --   HCT 41.3  --   --   PLT 243  --   --   LABPROT 32.5* 27.3* 30.6*  INR 3.08 2.48 2.86    Estimated Creatinine Clearance: 100.5 mL/min (by C-G formula based on SCr of 0.69 mg/dL).   Medical History: Past Medical History:  Diagnosis Date  . AICD (automatic cardioverter/defibrillator) present   . Atrial fibrillation ([place])   . CHF (congestive heart failure) ([place])   . COPD (chronic obstructive pulmonary disease) ([place])   . Coronary artery disease   . Hypertension   . Myocardial infarction ([place])   . Peripheral vascular disease ([place])   . Presence of permanent cardiac pacemaker     Medications:  Scheduled:  . ALPRAZolam  1 mg Oral QHS  . aspirin EC  81 mg Oral Daily  . budesonide (PULMICORT) nebulizer solution  0.5 mg Nebulization BID  . chlorhexidine  15 mL Mouth Rinse BID  . docusate sodium  100 mg Oral BID  . guaiFENesin  600 mg Oral BID  . levofloxacin  500 mg Oral Daily  . [START ON 10/21/2016] methylPREDNISolone (SOLU-MEDROL) injection  40 mg Intravenous Q12H  . metoprolol succinate  100 mg Oral Q breakfast  . pantoprazole  40 mg Oral Daily  . pneumococcal 23 valent vaccine  0.5 mL Intramuscular Tomorrow-1000  . pravastatin  20 mg Oral q1800  . ramipril  10 mg Oral BID  . tiotropium  1 capsule Inhalation Daily  . warfarin  1.5 mg Oral ONCE-1800  . Warfarin - Pharmacist Dosing Inpatient   Does not apply q1800    Assessment: Patient admitted w/ SOB and is anticoagulated w/ warfarin for chronic  afib. Home dose per med rec: warfarin 5 mg daily at 6 pm  5/23 INR 2.56    5 mg 5/24: INR: 2.33  5 mg 5/25: INR: 2.69 5 mg 5/26: INR; 3.23            HOLD 5/27: INR: 3.08           3 mg  5/28: INR: 2.48           5 mg 5/28  INR: 2.86 3mg  Goal of Therapy:  INR 2-3 Monitor platelets by anticoagulation protocol: Yes   Updated Plan: 5/29 1730 Patient being started on levofloxacin. Due to possibility of interaction with warfarin, will decrease scheduled warfarin 3 mg PO dose by 50% and f/u with am labs  Darrow Bussing, PharmD Pharmacy Resident 10/20/2016 5:34 PM

## 2016-10-21 ENCOUNTER — Inpatient Hospital Stay: Payer: 59

## 2016-10-21 LAB — SODIUM: Sodium: 131 mmol/L — ABNORMAL LOW (ref 135–145)

## 2016-10-21 LAB — PROTIME-INR
INR: 3.65
Prothrombin Time: 37.2 seconds — ABNORMAL HIGH (ref 11.4–15.2)

## 2016-10-21 LAB — TROPONIN I
Troponin I: <0.03 ng/mL (ref <0.03)
Troponin I: <0.03 ng/mL (ref <0.03)

## 2016-10-21 LAB — OSMOLALITY, URINE: Osmolality, Ur: 466 mOsm/kg (ref 300–900)

## 2016-10-21 MED ORDER — ASPIRIN EC 325 MG PO TBEC
325.0000 mg | DELAYED_RELEASE_TABLET | Freq: Once | ORAL | Status: AC
  Administered 2016-10-21: 325 mg via ORAL
  Filled 2016-10-21: qty 1

## 2016-10-21 MED ORDER — POLYETHYLENE GLYCOL 3350 17 G PO PACK
17.0000 g | PACK | Freq: Every day | ORAL | Status: DC
  Administered 2016-10-21 – 2016-10-23 (×3): 17 g via ORAL
  Filled 2016-10-21 (×3): qty 1

## 2016-10-21 MED ORDER — NITROGLYCERIN 0.4 MG SL SUBL
0.4000 mg | SUBLINGUAL_TABLET | SUBLINGUAL | Status: DC | PRN
  Administered 2016-10-21: 17:00:00 0.4 mg via SUBLINGUAL
  Filled 2016-10-21: qty 1

## 2016-10-21 MED ORDER — NITROGLYCERIN 2 % TD OINT
1.0000 [in_us] | TOPICAL_OINTMENT | Freq: Four times a day (QID) | TRANSDERMAL | Status: DC
  Administered 2016-10-21 – 2016-10-23 (×5): 1 [in_us] via TOPICAL
  Filled 2016-10-21 (×5): qty 1

## 2016-10-21 MED ORDER — FLEET ENEMA 7-19 GM/118ML RE ENEM: 1.0000 | ENEMA | Freq: Once | RECTAL | Status: DC

## 2016-10-21 MED ORDER — BISACODYL 10 MG RE SUPP
10.0000 mg | Freq: Every day | RECTAL | Status: DC
  Administered 2016-10-21: 10 mg via RECTAL
  Filled 2016-10-21: qty 1

## 2016-10-21 NOTE — Progress Notes (Addendum)
[facility] [place] NAME: Tyler [name]    MR#:  [id number]  DATE OF BIRTH:  [birth year]  SUBJECTIVE:  CHIEF COMPLAINT:   Chief Complaint  Patient presents with  . Respiratory Distress   still wheezing, mild cough and shortness breath, hypoxia on Exertion, on oxygen 2 L by nasal cannula. The patient does not use oxygen at home. He feels Minimally improved, he was seen by pulmonologist yesterday, initiated on nighttime BiPAP, which helped him a lot. REVIEW OF SYSTEMS:  Review of Systems  Constitutional: Positive for malaise/fatigue. Negative for chills and fever.  HENT: Negative for congestion.   Eyes: Negative for blurred vision and double vision.  Respiratory: Positive for cough, shortness of breath and wheezing. Negative for hemoptysis, sputum production and stridor.   Cardiovascular: Negative for chest pain, palpitations and leg swelling.  Gastrointestinal: Negative for abdominal pain, blood in stool, constipation, diarrhea, melena, nausea and vomiting.  Genitourinary: Negative for dysuria and hematuria.  Musculoskeletal: Negative for back pain.  Neurological: Negative for dizziness, focal weakness, loss of consciousness and weakness.  Psychiatric/Behavioral: Negative for depression. The patient is nervous/anxious.     DRUG ALLERGIES:  No Known Allergies VITALS:  Blood pressure (!) 153/84, pulse 87, temperature 97.9 F (36.6 C), temperature source Axillary, resp. rate 20, height 6\' 1"  (1.854 m), weight 72.2 kg (159 lb 3.2 oz), SpO2 98 %. PHYSICAL EXAMINATION:  Physical Exam  Constitutional: He is oriented to person, place, and time and well-developed, well-nourished, and in no distress.  HENT:  Head: Normocephalic.  Mouth/Throat: Oropharynx is clear and moist.  Eyes: Conjunctivae and EOM are normal. No scleral icterus.  Neck: Normal range of motion. Neck supple. No JVD present. No tracheal deviation present.  Cardiovascular:  Normal rate, regular rhythm and normal heart sounds.  Exam reveals no gallop.   No murmur heard. Pulmonary/Chest: Tachypnea noted. He has wheezes. He has no rales.  Bilateral moderate to severe expiratory wheezing  Musculoskeletal: Normal range of motion. He exhibits no edema or tenderness.  Right AKA.  Neurological: He is alert and oriented to person, place, and time. No cranial nerve deficit.  Skin: No rash noted. No erythema.  Psychiatric: Affect normal.   LABORATORY PANEL:  Male CBC  Recent Labs Lab 10/18/16 0447  WBC 11.7*  HGB 14.0  HCT 41.3  PLT 243   ------------------------------------------------------------------------------------------------------------------ Chemistries   Recent Labs Lab 10/15/16 0336 10/21/16 0356  NA 133* 131*  K 3.9  --   CL 99*  --   CO2 27  --   GLUCOSE 120*  --   BUN 15  --   CREATININE 0.69  --   CALCIUM 9.1  --   MG 1.8  --    RADIOLOGY:  Dg Chest Port 1 View  Result Date: 10/21/2016 CLINICAL DATA:  Respiratory failure EXAM: PORTABLE CHEST 1 VIEW COMPARISON:  Portable chest x-ray of Oct 15, 2016 FINDINGS: The lungs remain hyperinflated and clear. There is no pneumothorax or pleural effusion. The heart and pulmonary vascularity are normal. There is calcification in the wall of the aortic arch. The ICD is in stable position. The observed bony thorax exhibits no acute abnormality. IMPRESSION: COPD.  No pneumonia, CHF, nor other acute cardiopulmonary disease. Electronically Signed   By: David  Martinique M.D.   On: 10/21/2016 07:08   ASSESSMENT AND PLAN:   Acute respiratory failure with hypoxia and hypercapnia (HCC) - due to COPD exacerbation,  Prn BiPAP,   at  nighttime, continue O2  2L.  continue IV Solu-Medrol 40 mg q12h, continue duonebs, changed budesonide nebulizers, continue Spiriva, now on levofloxacin. Following closely, weaning off oxygen as tolerated  Appreciate Pulmonary input. Order nocturnal oximetry study on oxygen to  qualify patient for BiPAP    Essential hypertension, benign - continue home meds, blood pressure is poorly controlled, likely due to stress, following closely and advance medications as needed    Chronic systolic CHF (congestive heart failure) ([place]) - continue home medications, advanced Altace  dose, stable clinically    AF (paroxysmal atrial fibrillation) ([place]) - home rate controlling meds and anticoagulation, Coumadin level is therapeutic.   CAD and PVD (peripheral vascular disease) ([place]) - home meds and anticoagulation  Hyponatremia. Worse today, get osmolarity of urine, initiate fluid restriction for high  Anxiety. Xanax When necessary. May need morphine, Roxanol  Generalized weakness, PT suggest home health and PT, to be arranged upon discharge  All the records are reviewed and case discussed with Care Management/Social Worker. Management plans discussed with the patient, his wife, and they are in agreement.  CODE STATUS: Full Code  TOTAL TIME TAKING CARE OF THIS PATIENT: 30 minutes.    POSSIBLE D/C IN 2 DAYS, DEPENDING ON CLINICAL CONDITION.   Theodoro Grist M.D on 10/21/2016 at 2:11 PM  Between 7am to 6pm - Pager - [phone]  After 6pm go to [email or web address] - Proofreader  [facility]  Office  [phone]  CC: Primary care physician; Madelyn Brunner, MD  Note: This dictation was prepared with Dragon dictation along with smaller phrase technology. Any transcriptional errors that result from this process are unintentional.

## 2016-10-21 NOTE — Progress Notes (Signed)
Physical Therapy Treatment Patient Details Name: Tyler [name] MRN: [id number] DOB: 1956-04-17 Today's Date: 10/21/2016    History of Present Illness Tyler [name] is a 61yo white male who comes [facility] on 5/22 with 4D SOB, admitted for acute respiratory failure and hypoxia/hypercapnia. PMH: Rt AKA (8Ya), AF c AICD/PPM, CHF, COPD, CAD, HTN, PVD. At baseline pt performs household distance AMB with AC. He performs ADL independently but requires significantly increased time due to severe SOB/DOe at baseline.     PT Comments    Pt agreeable to PT; no new voiced complaints. Continues to have shortness of breath with short bouts of mild activity. Pt with Mod I for bed mobility and transfers. Supervision with ambulation and education on energy conservation to improve O2 saturation. Initial walk of 32 ft pt desaturated on 2 liters O2 from 98% to 83%; pt noted to be conversing. Educated on avoiding conversation while ambulating as well as pauses for several deep breaths with full exhalation. Second and third walk of 32 and 64 feet respectively with O2 saturation maintained at 95%. Pt also provided education on breathing with upper body exercises pt shares with therapist he performs at home; also educated on initiating seated yoga 10-20 min programs to start for improved strength and mindful breathing. Continue PT to progress function with safe O2 saturation levels.    Follow Up Recommendations  Home health PT     Equipment Recommendations  None recommended by PT (did discuss rw for ease of transporting O2 if needed)    Recommendations for Other Services       Precautions / Restrictions Precautions Precautions: Fall Restrictions Weight Bearing Restrictions: No Other Position/Activity Restrictions: RLE AKA    Mobility  Bed Mobility Overal bed mobility: Modified Independent                Transfers Overall transfer level: Modified independent Equipment used: Crutches Transfers:  Sit to/from Stand Sit to Stand: Modified independent (Device/Increase time)         General transfer comment: Good speed/control, safe, no LOB  Ambulation/Gait Ambulation/Gait assistance: Supervision Ambulation Distance (Feet): 32 Feet (2nd walk 32 ft; 3rd walk 64 ft) Assistive device: Crutches       General Gait Details: First walk desaturation from 98 % to 83%; second and third walk maintaind 95% using no talk and pause deep breathe methods for E conservation    Stairs            Wheelchair Mobility    Modified Rankin (Stroke Patients Only)       Balance Overall balance assessment: Modified Independent;No apparent balance deficits (not formally assessed)                                          Cognition Arousal/Alertness: Awake/alert Behavior During Therapy: WFL for tasks assessed/performed Overall Cognitive Status: Within Functional Limits for tasks assessed                                        Exercises Other Exercises Other Exercises: Educated pt on breathing techniques for upper body exercises he performs at home.  Other Exercises: Educated pt on initiating seated yoga exercises for greater focus on breathing while performing core/upper body movement/strength    General Comments  Pertinent Vitals/Pain Pain Assessment: No/denies pain    Home Living                      Prior Function            PT Goals (current goals can now be found in the care plan section) Progress towards PT goals: Progressing toward goals    Frequency    Min 2X/week      PT Plan Current plan remains appropriate    Co-evaluation              AM-PAC PT "6 Clicks" Daily Activity  Outcome Measure  Difficulty turning over in bed (including adjusting bedclothes, sheets and blankets)?: None Difficulty moving from lying on back to sitting on the side of the bed? : None Difficulty sitting down on and standing  up from a chair with arms (e.g., wheelchair, bedside commode, etc,.)?: None Help needed moving to and from a bed to chair (including a wheelchair)?: None Help needed walking in hospital room?: A Little Help needed climbing 3-5 steps with a railing? : A Little 6 Click Score: 22    End of Session Equipment Utilized During Treatment: Oxygen Activity Tolerance: Patient tolerated treatment well Patient left: in bed;with call bell/phone within reach   PT Visit Diagnosis: Difficulty in walking, not elsewhere classified (R26.2)     Time: 1470-9295 PT Time Calculation (min) (ACUTE ONLY): 33 min  Charges:  $Gait Training: 8-22 mins $Therapeutic Exercise: 8-22 mins                    G CodesLarae Grooms, PTA 10/21/2016, 12:14 PM

## 2016-10-21 NOTE — Progress Notes (Signed)
Patient refuses bed alarm, re-educated on purpose of the bed alarm.

## 2016-10-21 NOTE — Progress Notes (Signed)
ANTICOAGULATION CONSULT NOTE - Follow UP  Consult  Pharmacy Consult for warfarin dosing Indication: atrial fibrillation  No Known Allergies  Patient Measurements: Height: 6\' 1"  (185.4 cm) Weight: 159 lb 3.2 oz (72.2 kg) IBW/kg (Calculated) : 79.9  Vital Signs: Temp: 97.9 F (36.6 C) (05/30 0834) Temp Source: Axillary (05/30 0834) BP: 153/84 (05/30 0834) Pulse Rate: 79 (05/30 0834)  Labs:  Recent Labs  10/19/16 0438 10/20/16 0403 10/21/16 0356  LABPROT 27.3* 30.6* 37.2*  INR 2.48 2.86 3.65    Estimated Creatinine Clearance: 99 mL/min (by C-G formula based on SCr of 0.69 mg/dL).   Medical History: Past Medical History:  Diagnosis Date  . AICD (automatic cardioverter/defibrillator) present   . Atrial fibrillation ([place])   . CHF (congestive heart failure) ([place])   . COPD (chronic obstructive pulmonary disease) ([place])   . Coronary artery disease   . Hypertension   . Myocardial infarction ([place])   . Peripheral vascular disease ([place])   . Presence of permanent cardiac pacemaker     Medications:  Scheduled:  . ALPRAZolam  1 mg Oral QHS  . aspirin EC  81 mg Oral Daily  . budesonide (PULMICORT) nebulizer solution  0.5 mg Nebulization BID  . chlorhexidine  15 mL Mouth Rinse BID  . docusate sodium  100 mg Oral BID  . guaiFENesin  600 mg Oral BID  . levofloxacin  500 mg Oral Daily  . methylPREDNISolone (SOLU-MEDROL) injection  40 mg Intravenous Q12H  . metoprolol succinate  100 mg Oral Q breakfast  . pantoprazole  40 mg Oral Daily  . pneumococcal 23 valent vaccine  0.5 mL Intramuscular Tomorrow-1000  . polyethylene glycol  17 g Oral Daily  . pravastatin  20 mg Oral q1800  . ramipril  10 mg Oral BID  . tiotropium  1 capsule Inhalation Daily  . Warfarin - Pharmacist Dosing Inpatient   Does not apply q1800    Assessment: Patient admitted w/ SOB and is anticoagulated w/ warfarin for chronic afib. Home dose per med rec: warfarin 5 mg daily at 6 pm  5/23 INR 2.56    5  mg 5/24: INR: 2.33  5 mg 5/25: INR: 2.69 5 mg 5/26: INR; 3.23            HOLD 5/27: INR: 3.08           3 mg  5/28: INR: 2.48          5 mg 5/29  INR: 2.86 1.5mg  5/30  INR: 3.65             HOLD   Goal of Therapy:  INR 2-3 Monitor platelets by anticoagulation protocol: Yes   Plan: 5/30 INR supratherapeutic, most likely due to patient being started on levofloxacin. Warfarin dose will be held tonight. INR ordered daily while patient on Abx per protocol.   Will F/U with AM INR and order warfarin once INR is therapeutic.   ,Pernell Dupre, PharmD, BCPS Clinical Pharmacist 10/21/2016 9:13 AM

## 2016-10-22 ENCOUNTER — Inpatient Hospital Stay: Payer: 59

## 2016-10-22 LAB — TROPONIN I

## 2016-10-22 LAB — EXPECTORATED SPUTUM ASSESSMENT W GRAM STAIN, RFLX TO RESP C

## 2016-10-22 LAB — PROTIME-INR
INR: 3.24
PROTHROMBIN TIME: 33.8 s — AB (ref 11.4–15.2)

## 2016-10-22 LAB — EXPECTORATED SPUTUM ASSESSMENT W REFEX TO RESP CULTURE

## 2016-10-22 LAB — SODIUM: Sodium: 132 mmol/L — ABNORMAL LOW (ref 135–145)

## 2016-10-22 MED ORDER — MORPHINE SULFATE (CONCENTRATE) 10 MG/0.5ML PO SOLN
5.0000 mg | ORAL | Status: DC | PRN
  Administered 2016-10-22 – 2016-10-23 (×4): 5 mg via ORAL
  Filled 2016-10-22 (×4): qty 1

## 2016-10-22 MED ORDER — IPRATROPIUM-ALBUTEROL 0.5-2.5 (3) MG/3ML IN SOLN
3.0000 mL | RESPIRATORY_TRACT | Status: DC
  Administered 2016-10-22 – 2016-10-23 (×6): 3 mL via RESPIRATORY_TRACT
  Filled 2016-10-22 (×7): qty 3

## 2016-10-22 MED ORDER — WARFARIN SODIUM 1 MG PO TABS
1.0000 mg | ORAL_TABLET | Freq: Once | ORAL | Status: AC
  Administered 2016-10-22: 18:00:00 1 mg via ORAL
  Filled 2016-10-22: qty 1

## 2016-10-22 MED ORDER — SIMETHICONE 80 MG PO CHEW
80.0000 mg | CHEWABLE_TABLET | Freq: Four times a day (QID) | ORAL | Status: DC
  Administered 2016-10-22 – 2016-10-23 (×4): 80 mg via ORAL
  Filled 2016-10-22 (×7): qty 1

## 2016-10-22 NOTE — Progress Notes (Signed)
Dr. Ether Griffins notified Tyler [name] still has not had BM after enema, mialax, colace, and dulcolax this morning. Tyler [name] not complaining of pain just "uncofmrtable and frustrated" Verbal order for STAT Abdominal KUB.

## 2016-10-22 NOTE — Progress Notes (Signed)
[facility] [place] NAME: Tyler [name]    MR#:  [id number]  DATE OF BIRTH:  11-20-1955  SUBJECTIVE:  CHIEF COMPLAINT:   Chief Complaint  Patient presents with  . Respiratory Distress   still wheezing, mild cough and shortness breath, hypoxia on Exertion, on oxygen 2 L by nasal cannula. The patient Did not use oxygen at home. No significant improvement today. Oxygenation is good on oxygen per nasal cannulas at night, did not qualify for BiPAP. Complains of cough. No significant sputum production, constipation. KUB reveals no obstruction or significant stool burden. Patient does not feel back to himself yet, does not want to go home, complains of bloating sensation in the abdomen REVIEW OF SYSTEMS:  Review of Systems  Constitutional: Positive for malaise/fatigue. Negative for chills and fever.  HENT: Negative for congestion.   Eyes: Negative for blurred vision and double vision.  Respiratory: Positive for cough, shortness of breath and wheezing. Negative for hemoptysis, sputum production and stridor.   Cardiovascular: Negative for chest pain, palpitations and leg swelling.  Gastrointestinal: Positive for abdominal pain and constipation. Negative for blood in stool, diarrhea, melena, nausea and vomiting.  Genitourinary: Negative for dysuria and hematuria.  Musculoskeletal: Negative for back pain.  Neurological: Negative for dizziness, focal weakness, loss of consciousness and weakness.  Psychiatric/Behavioral: Negative for depression. The patient is nervous/anxious.     DRUG ALLERGIES:  No Known Allergies VITALS:  Blood pressure 132/65, pulse (!) 53, temperature 97.5 F (36.4 C), temperature source Oral, resp. rate 18, height 6\' 1"  (1.854 m), weight 71.4 kg (157 lb 4.8 oz), SpO2 97 %. PHYSICAL EXAMINATION:  Physical Exam  Constitutional: He is oriented to person, place, and time and well-developed, well-nourished, and in no distress.    HENT:  Head: Normocephalic.  Mouth/Throat: Oropharynx is clear and moist.  Eyes: Conjunctivae and EOM are normal. No scleral icterus.  Neck: Normal range of motion. Neck supple. No JVD present. No tracheal deviation present.  Cardiovascular: Normal rate, regular rhythm and normal heart sounds.  Exam reveals no gallop.   No murmur heard. Pulmonary/Chest: Tachypnea noted. He has wheezes. He has no rales.  Bilateral moderate to severe expiratory wheezing  Musculoskeletal: Normal range of motion. He exhibits no edema or tenderness.  Right AKA.  Neurological: He is alert and oriented to person, place, and time. No cranial nerve deficit.  Skin: No rash noted. No erythema.  Psychiatric: Affect normal.   LABORATORY PANEL:  Male CBC  Recent Labs Lab 10/18/16 0447  WBC 11.7*  HGB 14.0  HCT 41.3  PLT 243   ------------------------------------------------------------------------------------------------------------------ Chemistries   Recent Labs Lab 10/22/16 0415  NA 132*   RADIOLOGY:  Dg Abd 1 View  Result Date: 10/22/2016 CLINICAL DATA:  Clinical constipation. Patient is on chronic morphine. EXAM: ABDOMEN - 1 VIEW COMPARISON:  Report of an abdominal series of January 22, 2015 FINDINGS: There is a moderate amount of gas noted within the ascending, transverse, and descending portions of the colon. There is some stool and gas in the rectosigmoid. There is no small or large bowel obstructive pattern. No free extraluminal gas collections are observed. The bony structures exhibit no acute abnormalities. There are splenic granulomas present. There may be tiny stones in both kidneys. There is calcification in the wall of the abdominal aorta and common iliac arteries. IMPRESSION: The colonic stool burden is not increased. There is a moderate amount of gas within the ascending and transverse portions of  the colon with less gas noted in the descending colon and rectosigmoid. No evidence of  obstruction or perforation. Electronically Signed   By: David  Martinique M.D.   On: 10/22/2016 13:17   ASSESSMENT AND PLAN:   Acute respiratory failure with hypoxia and hypercapnia (HCC) - due to COPD exacerbation,  Prn BiPAP,   at nighttime, continue O2 [place] 2L, Did not qualify for BiPAP at home.  continue IV Solu-Medrol 40 mg q12h, duonebs, budesonide nebulizers, Spiriva, levofloxacin. Following closely, weaning off oxygen as tolerated, remains on 2 L, , good oxygen saturation at night on nasal cannulas. Appreciate Pulmonary input.     Essential hypertension, benign - continue home meds, blood pressure is  Better controlled, likely due to stress    Chronic systolic CHF (congestive heart failure) ([place]) - continue home medications, advanced Altace  dose, stable clinically    AF (paroxysmal atrial fibrillation) ([place]) - home rate controlling meds and anticoagulation, Coumadin level is therapeutic.   CAD and PVD (peripheral vascular disease) ([place]) - home meds and anticoagulation  Hyponatremia due to SIADH,  initiated fluid restriction, patient's sodium level has improved today, follow closely.  Anxiety. Xanax When necessary. initiate Roxanol   Generalized weakness, PT suggest home health and PT, to be arranged upon discharge  Constipation, KUB failed to reveal significant stool burden, start patient on simethicone for distention relief, continue stool softeners and stimulants   All the records are reviewed and case discussed with Care Management/Social Worker. Management plans discussed with the patient, his wife, and they are in agreement.  CODE STATUS: Full Code  TOTAL TIME TAKING CARE OF THIS PATIENT: 40 minutes.    POSSIBLE D/C IN 2 DAYS, DEPENDING ON CLINICAL CONDITION.   Theodoro Grist M.D on 10/22/2016 at 3:50 PM  Between 7am to 6pm - Pager - [phone]  After 6pm go to [email or web address] - Proofreader  [facility]  Office  [phone]  CC:  Primary care physician; Madelyn Brunner, MD  Note: This dictation was prepared with Dragon dictation along with smaller phrase technology. Any transcriptional errors that result from this process are unintentional.

## 2016-10-22 NOTE — Progress Notes (Signed)
ANTICOAGULATION CONSULT NOTE - Follow UP  Consult  Pharmacy Consult for warfarin dosing Indication: atrial fibrillation  No Known Allergies  Patient Measurements: Height: 6\' 1"  (185.4 cm) Weight: 157 lb 4.8 oz (71.4 kg) IBW/kg (Calculated) : 79.9  Vital Signs: Temp: 98.6 F (37 C) (05/31 0819) Temp Source: Oral (05/31 0819) BP: 125/69 (05/31 0819) Pulse Rate: 65 (05/31 0819)  Labs:  Recent Labs  10/20/16 0403 10/21/16 0356 10/21/16 1701 10/21/16 2031 10/22/16 0047 10/22/16 0415  LABPROT 30.6* 37.2*  --   --   --  33.8*  INR 2.86 3.65  --   --   --  3.24  TROPONINI  --   --  <0.03 <0.03 <0.03  --     Estimated Creatinine Clearance: 97.9 mL/min (by C-G formula based on SCr of 0.69 mg/dL).   Medical History: Past Medical History:  Diagnosis Date  . AICD (automatic cardioverter/defibrillator) present   . Atrial fibrillation ([place])   . CHF (congestive heart failure) ([place])   . COPD (chronic obstructive pulmonary disease) ([place])   . Coronary artery disease   . Hypertension   . Myocardial infarction ([place])   . Peripheral vascular disease ([place])   . Presence of permanent cardiac pacemaker     Medications:  Scheduled:  . ALPRAZolam  1 mg Oral QHS  . aspirin EC  81 mg Oral Daily  . bisacodyl  10 mg Rectal Daily  . budesonide (PULMICORT) nebulizer solution  0.5 mg Nebulization BID  . chlorhexidine  15 mL Mouth Rinse BID  . docusate sodium  100 mg Oral BID  . guaiFENesin  600 mg Oral BID  . ipratropium-albuterol  3 mL Nebulization Q4H  . levofloxacin  500 mg Oral Daily  . methylPREDNISolone (SOLU-MEDROL) injection  40 mg Intravenous Q12H  . metoprolol succinate  100 mg Oral Q breakfast  . nitroGLYCERIN  1 inch Topical Q6H  . pantoprazole  40 mg Oral Daily  . pneumococcal 23 valent vaccine  0.5 mL Intramuscular Tomorrow-1000  . polyethylene glycol  17 g Oral Daily  . pravastatin  20 mg Oral q1800  . ramipril  10 mg Oral BID  . sodium phosphate  1 enema Rectal Once   . tiotropium  1 capsule Inhalation Daily  . Warfarin - Pharmacist Dosing Inpatient   Does not apply q1800    Assessment: Patient admitted w/ SOB and is anticoagulated w/ warfarin for chronic afib. Home dose per med rec: warfarin 5 mg daily at 6 pm  5/23 INR 2.56    5 mg 5/24: INR: 2.33  5 mg 5/25: INR: 2.69 5 mg 5/26: INR; 3.23            HOLD 5/27: INR: 3.08           3 mg  5/28: INR: 2.48          5 mg 5/29  INR: 2.86 1.5mg  5/30  INR: 3.65             HOLD  5/31 INR   3.24 1mg    Goal of Therapy:  INR 2-3 Monitor platelets by anticoagulation protocol: Yes   Plan: 5/30 INR remains supratherapeutic, most likely due to patient being started on levofloxacin. Will order Warfarin 1mg  x 1 dose this evening.    Will F/U with AM INR and order warfarin once INR is therapeutic.   ,Pernell Dupre, PharmD, BCPS Clinical Pharmacist 10/22/2016 10:26 AM

## 2016-10-22 NOTE — Consult Note (Signed)
[facility] Consultation Note  Patient ID: Tyler [name], MRN: [id number], DOB/AGE: 61-Aug-1957 61 y.o. Admit date: 10/13/2016   Date of Consult: 10/22/2016 Primary Physician: Madelyn Brunner, MD Primary [place]  Chief Complaint:  Chief Complaint  Patient presents with  . Respiratory Distress   Reason for Consult: respiratory failure  HPI: 61 y.o. male with the known cardiovascular disease status post previous myocardial infarction LV systolic dysfunction vascular disease status post defibrillator placement who is an appropriate medication management for further risk reduction cardiovascular event and tolerating it well. He has had severe lung disease for which has continued to had significant worsening over the last several months to a year with no treatment that is helped a great deal. The patient has had an acute exacerbation of COPD for which she is severely hypoxic and was seen in the emergency room with improvements with oxygenation. The patient did have morphine for which did help his chest pressure from his hypoxia and his oxygenating much better now. There was no evidence of myocardial infarction with a normal troponin and his BNP was only 327 more consistent with lung disease and COPD hypoxia rather than heart failure. Lungs have been relatively clear with no pulmonary edema. The patient also had constipation and was having chest pain when on the commode for which was intermittent in nature and most consistent with atypical chest pain rather than acute coronary syndrome or true angina. Cardiac catheterization last year showed patent stent and no evidence of new coronary disease requiring further intervention  Past Medical History:  Diagnosis Date  . AICD (automatic cardioverter/defibrillator) present   . Atrial fibrillation ([place])   . CHF (congestive heart failure) ([place])   . COPD (chronic obstructive pulmonary disease) ([place])   . Coronary artery disease    . Hypertension   . Myocardial infarction ([place])   . Peripheral vascular disease ([place])   . Presence of permanent cardiac pacemaker       Surgical History:  Past Surgical History:  Procedure Laterality Date  . BELOW KNEE LEG AMPUTATION Left   . INSERT / REPLACE / REMOVE PACEMAKER    . PERIPHERAL VASCULAR CATHETERIZATION Left 12/09/2015   Procedure: Lower Extremity Angiography;  Surgeon: Algernon Huxley, MD;  Location: [facility];  Service: Cardiovascular;  Laterality: Left;  . PERIPHERAL VASCULAR CATHETERIZATION  12/09/2015   Procedure: Lower Extremity Intervention;  Surgeon: Algernon Huxley, MD;  Location: [facility];  Service: Cardiovascular;;     Home Meds: Prior to Admission medications   Medication Sig Start Date End Date Taking? Authorizing Provider  albuterol (PROVENTIL HFA;VENTOLIN HFA) 108 (90 BASE) MCG/ACT inhaler Inhale 2 puffs into the lungs.   Yes [provider]  albuterol-ipratropium (COMBIVENT) 18-103 MCG/ACT inhaler Inhale 1 puff into the lungs every 4 (four) hours.   Yes [provider]  ALPRAZolam Duanne Moron) 1 MG tablet Take 1 mg by mouth at bedtime.   Yes [provider]  aspirin EC 81 MG tablet Take 1 tablet (81 mg total) by mouth daily. 12/09/15  Yes [name], Erskine Squibb, MD  calcium carbonate (TUMS - DOSED IN MG ELEMENTAL CALCIUM) 500 MG chewable tablet 1 tablet daily. prn   Yes [provider]  clotrimazole-betamethasone (LOTRISONE) cream Apply 1 application topically 2 (two) times daily.   Yes [provider]  docusate sodium (COLACE) 100 MG capsule Take 100 mg by mouth every other day.   Yes [provider]  fluticasone-salmeterol (ADVAIR HFA) [phone]  MCG/ACT inhaler USE 2 INHALATIONS ORALLY   EVERY 12 HOURS 04/06/16  Yes [provider]  levalbuterol (XOPENEX) 1.25 MG/3ML nebulizer solution Take 3 mLs by nebulization every 6 (six) hours as needed for wheezing.   Yes [provider]  lovastatin  (MEVACOR) 20 MG tablet Take 20 mg by mouth at bedtime.   Yes [provider]  metoprolol succinate (TOPROL-XL) 100 MG 24 hr tablet Take 100 mg by mouth daily. Take with or immediately following a meal.   Yes [provider]  omeprazole (PRILOSEC) 20 MG capsule Take 20 mg by mouth daily.   Yes [provider]  ramipril (ALTACE) 10 MG capsule Take 10 mg by mouth daily.   Yes [provider]  SPIRIVA RESPIMAT 1.25 MCG/ACT AERS Inhale 2 puffs into the lungs daily.  04/09/16  Yes [provider]  warfarin (COUMADIN) 5 MG tablet Take 5 mg by mouth one time only at 6 PM.    Yes [provider]  predniSONE (DELTASONE) 20 MG tablet Take 3 tablets (60 mg total) by mouth daily. Patient not taking: Reported on 10/13/2016 05/20/15   Hinda Kehr, MD    Inpatient Medications:  . ALPRAZolam  1 mg Oral QHS  . aspirin EC  81 mg Oral Daily  . bisacodyl  10 mg Rectal Daily  . budesonide (PULMICORT) nebulizer solution  0.5 mg Nebulization BID  . chlorhexidine  15 mL Mouth Rinse BID  . docusate sodium  100 mg Oral BID  . guaiFENesin  600 mg Oral BID  . ipratropium-albuterol  3 mL Nebulization Q4H  . levofloxacin  500 mg Oral Daily  . methylPREDNISolone (SOLU-MEDROL) injection  40 mg Intravenous Q12H  . metoprolol succinate  100 mg Oral Q breakfast  . nitroGLYCERIN  1 inch Topical Q6H  . pantoprazole  40 mg Oral Daily  . pneumococcal 23 valent vaccine  0.5 mL Intramuscular Tomorrow-1000  . polyethylene glycol  17 g Oral Daily  . pravastatin  20 mg Oral q1800  . ramipril  10 mg Oral BID  . sodium phosphate  1 enema Rectal Once  . tiotropium  1 capsule Inhalation Daily  . warfarin  1 mg Oral ONCE-1800  . Warfarin - Pharmacist Dosing Inpatient   Does not apply q1800     Allergies: No Known Allergies  Social History   Social History  . Marital status: Married    Spouse name: N/A  . Number of children: N/A  . Years of education: N/A    Occupational History  . Not on file.   Social History Main Topics  . Smoking status: Former Smoker    Packs/day: 1.00    Years: 42.00    Types: Cigarettes    Quit date: 09/23/2013  . Smokeless tobacco: Never Used  . Alcohol use No  . Drug use: No  . Sexual activity: Not on file   Other Topics Concern  . Not on file   Social History Narrative  . No narrative on file     Family History  Problem Relation Age of Onset  . Cancer Mother   . Cancer Father   . Heart disease Father      Review of Systems Positive forChest pain constipation Negative for: General:  chills, fever, night sweats or weight changes.  Cardiovascular: PND orthopnea syncope dizziness  Dermatological skin lesions rashes Respiratory: Cough congestion Urologic: Frequent urination urination at night and hematuria Abdominal: negative for nausea, vomiting, diarrhea, bright red blood per rectum, melena,  or hematemesis Neurologic: negative for visual changes, and/or hearing changes  All other systems reviewed and are otherwise negative except as noted above.  Labs:  Recent Labs  10/21/16 1701 10/21/16 2031 10/22/16 0047  TROPONINI <0.03 <0.03 <0.03   Lab Results  Component Value Date   WBC 11.7 (H) 10/18/2016   HGB 14.0 10/18/2016   HCT 41.3 10/18/2016   MCV 87.4 10/18/2016   PLT 243 10/18/2016    Recent Labs Lab 10/22/16 0415  NA 132*   No results found for: CHOL, HDL, LDLCALC, TRIG No results found for: DDIMER  Radiology/Studies:  Dg Abd 1 View  Result Date: 10/22/2016 CLINICAL DATA:  Clinical constipation. Patient is on chronic morphine. EXAM: ABDOMEN - 1 VIEW COMPARISON:  Report of an abdominal series of January 22, 2015 FINDINGS: There is a moderate amount of gas noted within the ascending, transverse, and descending portions of the colon. There is some stool and gas in the rectosigmoid. There is no small or large bowel obstructive pattern. No free extraluminal gas collections are  observed. The bony structures exhibit no acute abnormalities. There are splenic granulomas present. There may be tiny stones in both kidneys. There is calcification in the wall of the abdominal aorta and common iliac arteries. IMPRESSION: The colonic stool burden is not increased. There is a moderate amount of gas within the ascending and transverse portions of the colon with less gas noted in the descending colon and rectosigmoid. No evidence of obstruction or perforation. Electronically Signed   By: David  Martinique M.D.   On: 10/22/2016 13:17   Dg Chest Port 1 View  Result Date: 10/21/2016 CLINICAL DATA:  Respiratory failure EXAM: PORTABLE CHEST 1 VIEW COMPARISON:  Portable chest x-ray of Oct 15, 2016 FINDINGS: The lungs remain hyperinflated and clear. There is no pneumothorax or pleural effusion. The heart and pulmonary vascularity are normal. There is calcification in the wall of the aortic arch. The ICD is in stable position. The observed bony thorax exhibits no acute abnormality. IMPRESSION: COPD.  No pneumonia, CHF, nor other acute cardiopulmonary disease. Electronically Signed   By: David  Martinique M.D.   On: 10/21/2016 07:08   Dg Chest Port 1 View  Result Date: 10/15/2016 CLINICAL DATA:  Worsening shortness of Breath EXAM: PORTABLE CHEST 1 VIEW COMPARISON:  10/13/2016 FINDINGS: Cardiac shadow is mildly prominent. Defibrillator is again seen and stable. The lungs are hyperinflated consistent with COPD. No focal infiltrate or sizable effusion is noted. No bony abnormality is seen. IMPRESSION: COPD without acute abnormality. Electronically Signed   By: Inez Catalina M.D.   On: 10/15/2016 09:30   Dg Chest Port 1 View  Result Date: 10/13/2016 CLINICAL DATA:  Acute respiratory failure EXAM: PORTABLE CHEST 1 VIEW COMPARISON:  05/20/2015 FINDINGS: Left-sided duo lead pacing device with similar appearance. Hyperinflation with emphysematous changes. No consolidation or effusion. Mild cardiomegaly with  atherosclerosis. No pneumothorax. IMPRESSION: 1. Cardiomegaly without edema 2. Hyperinflation and emphysematous changes.  No acute infiltrate. Electronically Signed   By: Donavan Foil M.D.   On: 10/13/2016 20:23    EKG: Ventricular pacing  Weights: Filed Weights   10/20/16 0500 10/21/16 0500 10/22/16 0500  Weight: 73.3 kg (161 lb 8 oz) 72.2 kg (159 lb 3.2 oz) 71.4 kg (157 lb 4.8 oz)     Physical Exam: Blood pressure 132/65, pulse (!) 51, temperature 97.5 F (36.4 C), temperature source Oral, resp. rate 18, height 6\' 1"  (1.854 m), weight 71.4 kg (157 lb 4.8 oz), SpO2 99 %.  Body mass index is 20.75 kg/m. General: Well developed, well nourished, in no acute distress. Head eyes ears nose throat: Normocephalic, atraumatic, sclera non-icteric, no xanthomas, nares are without discharge. No apparent thyromegaly and/or mass  Lungs: Normal respiratory effort.Diffuse wheezes, no rales, no rhonchi.  Heart: RRR with normal S1 S2. no murmur gallop, no rub, PMI is normal size and placement, carotid upstroke normal without bruit, jugular venous pressure is normal Abdomen: Soft, non-tender, non-distended with normoactive bowel sounds. No hepatomegaly. No rebound/guarding. No obvious abdominal masses. Abdominal aorta is normal size without bruit Extremities: No edema. no cyanosis, no clubbing, no ulcers  Peripheral : 2+ bilateral upper extremity pulses,   Neuro: Alert and oriented. No facial asymmetry. No focal deficit. Moves all extremities spontaneously. Musculoskeletal: Normal muscle tone without kyphosis Psych:  Responds to questions appropriately with a normal affect.    Assessment: 61 year old male with known coronary artery disease cardiomyopathy status post ICD with hypertension hyperlipidemia having a respiratory failure with hypoxia secondary to COPD without evidence of heart failure and pulmonary edema or myocardial infarction at this time  Plan: 1. No further cardiac intervention at this  time due to no evidence of heart failure or myocardial infarction 2. Continue supportive care of severe COPD hypoxia 3. No change in medication management for further prevention cardiovascular disease with hypertension and high lipid management 4. Begin ambulation and further assessment and treatment after above 5. Okay for discharge home from cardiac standpoint  Signed, Corey Skains M.D. [facility] 10/22/2016, 1:24 PM

## 2016-10-22 NOTE — Care Management (Signed)
Mr. Chait has been receiving Duonebs , IV Solumedrol and has a flutter valve during this admission, After all these interventions shows some respiratory improvement, but still requires 2 liters oxygen per nasal cannula to maintain oxygen saturations above 88% Shelbie Ammons RN MSN [facility] [phone]

## 2016-10-22 NOTE — Progress Notes (Signed)
SATURATION QUALIFICATIONS: (This note is used to comply with regulatory documentation for home oxygen)  Patient Saturations on Room Air at Rest = 92%  Patient Saturations on Room Air while Ambulating = 87%  Patient Saturations on 2 Liters of oxygen while Ambulating = 93%  Please briefly explain why patient needs home oxygen: COPD

## 2016-10-22 NOTE — Progress Notes (Signed)
No good BM 10/15/16, small BM yesterday after multiple interventions. Dr. Ether Griffins notified this morning- verbal order for daily soap suds enema PRN.   Soap suds enema tolerated well. Will continue to monitor.

## 2016-10-22 NOTE — Progress Notes (Signed)
Physical Therapy Treatment Patient Details Name: Tyler [name] MRN: [id number] DOB: 12/19/1955 Today's Date: 10/22/2016    History of Present Illness Tyler [name] is a 61yo white male who comes to [facility] on 5/22 with 4D SOB, admitted for acute respiratory failure and hypoxia/hypercapnia. PMH: Rt AKA (8Ya), AF c AICD/PPM, CHF, COPD, CAD, HTN, PVD. At baseline pt performs household distance AMB with AC. He performs ADL independently but requires significantly increased time due to severe SOB/DOe at baseline.     PT Comments    Pt agreeable to PT; pt notes he is feeling very good/positive, as he received good news regarding medical testing. Pt able to demonstrate improved management of O2 saturation levels with ambulation this session with increased ambulation distances. Pt eager to work on at home as well with strengthening programs; reinforced yoga exercises. Pt willing. Pt notes he plans to discharge home tomorrow. Continue PT to progress ambulation tolerance with safe O2 saturation if not discharged; otherwise, pt will continue efforts with home health PT.    Follow Up Recommendations  Home health PT     Equipment Recommendations  None recommended by PT    Recommendations for Other Services       Precautions / Restrictions Precautions Precautions: Fall Restrictions Weight Bearing Restrictions: No Other Position/Activity Restrictions: RLE AKA    Mobility  Bed Mobility Overal bed mobility: Modified Independent                Transfers Overall transfer level: Modified independent Equipment used: Crutches Transfers: Sit to/from Stand Sit to Stand: Modified independent (Device/Increase time)         General transfer comment: No issues; baseline  Ambulation/Gait Ambulation/Gait assistance: Supervision Ambulation Distance (Feet): 70 Feet (performed twice) Assistive device: Crutches       General Gait Details: Able to maintain O2 saturation 96-97% with  ambulation without talking and attention to breathing    Stairs            Wheelchair Mobility    Modified Rankin (Stroke Patients Only)       Balance Overall balance assessment: Modified Independent;No apparent balance deficits (not formally assessed)                                          Cognition Arousal/Alertness: Awake/alert Behavior During Therapy: WFL for tasks assessed/performed Overall Cognitive Status: Within Functional Limits for tasks assessed                                        Exercises      General Comments        Pertinent Vitals/Pain Pain Assessment: No/denies pain    Home Living                      Prior Function            PT Goals (current goals can now be found in the care plan section) Progress towards PT goals: Progressing toward goals    Frequency    Min 2X/week      PT Plan Current plan remains appropriate    Co-evaluation              AM-PAC PT "6 Clicks" Daily Activity  Outcome Measure  Difficulty turning over in bed (including  adjusting bedclothes, sheets and blankets)?: None Difficulty moving from lying on back to sitting on the side of the bed? : None Difficulty sitting down on and standing up from a chair with arms (e.g., wheelchair, bedside commode, etc,.)?: None Help needed moving to and from a bed to chair (including a wheelchair)?: None Help needed walking in hospital room?: None Help needed climbing 3-5 steps with a railing? : A Little 6 Click Score: 23    End of Session Equipment Utilized During Treatment: Oxygen Activity Tolerance: Patient tolerated treatment well Patient left: Other (comment) (seated in bed with family in the room)   PT Visit Diagnosis: Difficulty in walking, not elsewhere classified (R26.2)     Time: 1411-1430 PT Time Calculation (min) (ACUTE ONLY): 19 min  Charges:  $Gait Training: 8-22 mins                    G CodesLarae Grooms, PTA 10/22/2016, 2:58 PM

## 2016-10-22 NOTE — Care Management (Signed)
Tyler [name] has had the diagnosis of COPD for a few years and has trouble breathing at night when his head is elevated less than 30 degrees. Bed wedges do not provide enough elevation to resolve his breathing issues. Respiratory distress causes Tyler [name] to frequently change his bodies position when cannot be achieved with a normal bed, Shelbie Ammons RN MSN [facility] [phone]

## 2016-10-23 LAB — PROTIME-INR
INR: 2.19
Prothrombin Time: 24.7 seconds — ABNORMAL HIGH (ref 11.4–15.2)

## 2016-10-23 LAB — CBC
HEMATOCRIT: 42.9 % (ref 40.0–52.0)
Hemoglobin: 14.7 g/dL (ref 13.0–18.0)
MCH: 29.7 pg (ref 26.0–34.0)
MCHC: 34.2 g/dL (ref 32.0–36.0)
MCV: 86.9 fL (ref 80.0–100.0)
Platelets: 356 10*3/uL (ref 150–440)
RBC: 4.94 MIL/uL (ref 4.40–5.90)
RDW: 13.9 % (ref 11.5–14.5)
WBC: 18 10*3/uL — AB (ref 3.8–10.6)

## 2016-10-23 LAB — SODIUM: Sodium: 131 mmol/L — ABNORMAL LOW (ref 135–145)

## 2016-10-23 MED ORDER — PREDNISONE 10 MG PO TABS: 10.0000 mg | ORAL_TABLET | Freq: Every day | ORAL | 0 refills | Status: DC

## 2016-10-23 MED ORDER — GUAIFENESIN 100 MG/5ML PO SOLN: 5.0000 mL | ORAL | 0 refills | Status: DC | PRN

## 2016-10-23 MED ORDER — WARFARIN SODIUM 5 MG PO TABS: 5.0000 mg | ORAL_TABLET | Freq: Once | ORAL | Status: DC

## 2016-10-23 MED ORDER — SIMETHICONE 80 MG PO CHEW: 80.0000 mg | CHEWABLE_TABLET | Freq: Four times a day (QID) | ORAL | 0 refills | Status: DC

## 2016-10-23 MED ORDER — LEVOFLOXACIN 500 MG PO TABS: 500.0000 mg | ORAL_TABLET | Freq: Every day | ORAL | 0 refills | Status: DC

## 2016-10-23 MED ORDER — POLYETHYLENE GLYCOL 3350 17 G PO PACK: 17.0000 g | PACK | Freq: Every day | ORAL | 0 refills | Status: DC

## 2016-10-23 MED ORDER — LEVALBUTEROL HCL 1.25 MG/3ML IN NEBU: 3.0000 mL | INHALATION_SOLUTION | RESPIRATORY_TRACT | 12 refills | Status: DC

## 2016-10-23 MED ORDER — WARFARIN SODIUM 1 MG PO TABS: 3.0000 mg | ORAL_TABLET | Freq: Once | ORAL | Status: DC

## 2016-10-23 MED ORDER — GUAIFENESIN ER 600 MG PO TB12: 600.0000 mg | ORAL_TABLET | Freq: Two times a day (BID) | ORAL | 0 refills | Status: DC

## 2016-10-23 NOTE — Discharge Summary (Signed)
[facility] [place] NAME: Tyler [name]    MR#:  [id number]  DATE OF BIRTH:  [birth year]  DATE OF ADMISSION:  10/13/2016 ADMITTING PHYSICIAN: Lance Coon, MD  DATE OF DISCHARGE: 10/23/2016 11:39 AM  PRIMARY CARE PHYSICIAN: Madelyn Brunner, MD     ADMISSION DIAGNOSIS:  COPD exacerbation ([place]) [J44.1] Acute respiratory failure with hypoxia and hypercapnia ([place]) [J96.01, J96.02] Congestive heart failure, unspecified HF chronicity, unspecified heart failure type ([place]) [I50.9]  DISCHARGE DIAGNOSIS:  Principal Problem:   Acute respiratory failure with hypoxia and hypercapnia (HCC) Active Problems:   Essential hypertension, benign   PVD (peripheral vascular disease) (HCC)   COPD with acute exacerbation (HCC)   Chronic systolic CHF (congestive heart failure) (HCC)   AF (paroxysmal atrial fibrillation) ([place])   SECONDARY DIAGNOSIS:   Past Medical History:  Diagnosis Date  . AICD (automatic cardioverter/defibrillator) present   . Atrial fibrillation ([place])   . CHF (congestive heart failure) ([place])   . COPD (chronic obstructive pulmonary disease) ([place])   . Coronary artery disease   . Hypertension   . Myocardial infarction ([place])   . Peripheral vascular disease ([place])   . Presence of permanent cardiac pacemaker     .pro HOSPITAL COURSE:  Patient is a 61 year old Caucasian male with history of atrial fibrillation, congestive heart failure, cardiomyopathy, COPD, ongoing tobacco abuse, coronary artery disease, hypertension, peripheral vascular disease, who presents to the hospital with complaints of 40 history of progressive shortness of breath. In emergency room, he was noted to be hypoxic, requiring admission to intensive care unit for BiPAP administration. His chest x-ray revealed COPD, no pneumonia, no CHF. Initial VBGs showed pH of 7.19, PCO2 66, PO2 less than 31. The patient was anticoagulated with therapeutic ProTime INR ,  so pulmonary embolism was less of consideration. Patient was initiated on antibiotics, steroids, inhalation therapy, but was very slow to improve . He was seen by pulmonologist and was followed by him. While in the hospital. The patient was also followed by cardiologist, who recommended no further cardiac interventions or diagnostics. Patient qualified for home oxygen therapy. The patient was felt to be stable to be discharged home today.  Discussion by problem: #1 Acute respiratory failure with hypoxia and hypercapnia (HCC) - due to COPD exacerbation,  The patient did not qualify for BiPAP at home, he is to continue O2 [place] 2L,   continue steroid taper, Xopenex, Advair,, , Spiriva, levofloxacin. Follow-up with primary care physician within one week after discharge. Palliative care follow up as outpatient  #2 Essential hypertension, benign - continue home meds, blood pressure is  Better controlled, likely due to stress  #1EHUDJSH systolic CHF (congestive heart failure) ([place]) - continue home medications,  stable clinically  #4AF (paroxysmal atrial fibrillation) ([place]) - home rate controlling meds and anticoagulation, Coumadin level is therapeutic.  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 Xanax When necessary. Palliative care follow-up as outpatient   #8 Generalized weakness, PT suggest home health and PT, to be arranged upon discharge  #9 Constipation, KUB failed to reveal significant stool burden, started patient on simethicone for distention relief, continue stool softeners and stimulants, should improve with discontinuation of opiates   DISCHARGE CONDITIONS:   Guarded  CONSULTS OBTAINED:  Treatment Team:  Yolonda Kida, MD Corey Skains, MD  DRUG ALLERGIES:  No Known Allergies  DISCHARGE MEDICATIONS:   Discharge Medication List as of 10/23/2016 10:22 AM    START taking these medications   Details  guaiFENesin (MUCINEX) 600 MG 12 hr tablet Take 1 tablet (600 mg total) by mouth 2 (two) times daily., Starting Fri 10/23/2016, Normal    guaiFENesin (ROBITUSSIN) 100 MG/5ML SOLN Take 5 mLs (100 mg total) by mouth every 4 (four) hours as needed for cough or to loosen phlegm., Starting Fri 10/23/2016, Normal    levofloxacin (LEVAQUIN) 500 MG tablet Take 1 tablet (500 mg total) by mouth daily., Starting Fri 10/23/2016, Normal    polyethylene glycol (MIRALAX / GLYCOLAX) packet Take 17 g by mouth daily., Starting Sat 10/24/2016, Normal    simethicone (MYLICON) 80 MG chewable tablet Chew 1 tablet (80 mg total) by mouth 4 (four) times daily., Starting Fri 10/23/2016, Normal      CONTINUE these medications which have CHANGED   Details  levalbuterol (XOPENEX) 1.25 MG/3ML nebulizer solution Take 1.25 mg (3 mLs total) by nebulization every 4 (four) hours., Starting Fri 10/23/2016, Normal    predniSONE (DELTASONE) 10 MG tablet Take 1 tablet (10 mg total) by mouth daily with breakfast. Please take 6 pills in the morning on the day 1 and 2, then taper by one pill every 2 days until finished, thank you, Starting Fri 10/23/2016, Normal      CONTINUE these medications which have NOT CHANGED   Details  albuterol (PROVENTIL HFA;VENTOLIN HFA) 108 (90 BASE) MCG/ACT inhaler Inhale 2 puffs into the lungs., Historical Med    albuterol-ipratropium (COMBIVENT) 18-103 MCG/ACT inhaler Inhale 1 puff into the lungs every 4 (four) hours., Historical Med    ALPRAZolam (XANAX) 1 MG tablet Take 1 mg by mouth at bedtime., Historical Med    aspirin EC 81 MG tablet Take 1 tablet (81 mg total) by mouth daily., Starting Mon 12/09/2015, Print    calcium carbonate (TUMS - DOSED IN MG ELEMENTAL CALCIUM) 500 MG chewable tablet 1 tablet daily.  prn, Historical Med    clotrimazole-betamethasone (LOTRISONE) cream Apply 1 application topically 2 (two) times daily., Historical Med    docusate sodium (COLACE) 100 MG capsule Take 100 mg by mouth every other day., Historical Med    fluticasone-salmeterol (ADVAIR HFA) 115-21 MCG/ACT inhaler USE 2 INHALATIONS ORALLY   EVERY 12 HOURS, Historical Med    lovastatin (MEVACOR) 20 MG tablet Take 20 mg by mouth at bedtime., Historical Med    metoprolol succinate (TOPROL-XL) 100 MG 24 hr tablet Take 100 mg by mouth daily. Take with or immediately following a meal., Historical Med    omeprazole (PRILOSEC) 20 MG capsule Take 20 mg by mouth daily., Historical Med    ramipril (ALTACE) 10 MG capsule Take 10 mg by mouth daily., Historical Med    SPIRIVA RESPIMAT 1.25 MCG/ACT AERS Inhale 2 puffs into the lungs daily. , Starting Thu 04/09/2016, Historical Med    warfarin (COUMADIN) 5 MG tablet Take 5 mg by mouth one time only at 6 PM. , Historical Med         DISCHARGE INSTRUCTIONS:    The patient is to follow-up with primary care physician within one week after discharge  If you experience worsening of your admission symptoms, develop shortness of breath, life threatening emergency, suicidal or homicidal thoughts you must seek medical attention immediately by calling 911 or calling your MD immediately  if symptoms less severe.  You Must read complete instructions/literature along with all the possible adverse reactions/side effects for all the Medicines you take and that have been prescribed to you. Take any new Medicines after you have completely understood and accept all the possible adverse reactions/side effects.   Please note  You were cared for by a hospitalist during your hospital stay. If you have any questions about your discharge medications or the care you received while you were in the hospital after you are discharged, you can call the unit and asked to speak with the hospitalist on  call if the hospitalist that took care of you is not available. Once you are discharged, your primary care physician will handle any further medical issues. Please note that NO REFILLS for any discharge medications will be authorized once you are discharged, as it is imperative that you return to your primary care physician (or establish a relationship with a primary care physician if you do not have one) for your aftercare needs so that they can reassess your need for medications and monitor your lab values.    Today   CHIEF COMPLAINT:   Chief Complaint  Patient presents with  . Respiratory Distress    HISTORY OF PRESENT ILLNESS:     VITAL SIGNS:  Blood pressure (!) 147/60, pulse 68, temperature 97.6 F (36.4 C), temperature source Oral, resp. rate (!) 24, height 6\' 1"  (1.854 m), weight 71.1 kg (156 lb 12.8 oz), SpO2 96 %.  I/O:   Intake/Output Summary (Last 24 hours) at 10/23/16 1826 Last data filed at 10/23/16 0953  Gross per 24 hour  Intake              120 ml  Output             1350 ml  Net            -1230 ml    PHYSICAL EXAMINATION:  GENERAL:  61 y.o.-year-old patient lying in the bed with no acute distress.  EYES: Pupils equal, round, reactive to light and accommodation. No scleral icterus. Extraocular muscles intact.  HEENT: Head atraumatic, normocephalic. Oropharynx and nasopharynx clear.  NECK:  Supple, no jugular venous distention. No thyroid enlargement, no tenderness.  LUNGS:Diminished breath  sounds bilaterally,scattered wheezing, , no rales,rhonchi or crepitation.   intermittent use of accessory muscles of respiration.  CARDIOVASCULAR: S1, S2 normal. No murmurs, rubs, or gallops.  ABDOMEN: Soft, non-tender, non-distended. Bowel sounds present. No organomegaly or mass.  EXTREMITIES: No pedal edema, cyanosis, or clubbing.  NEUROLOGIC: Cranial nerves II through XII are intact. Muscle strength 5/5 in all extremities. Sensation intact. Gait not checked.    PSYCHIATRIC: The patient is alert and oriented x 3.  SKIN: No obvious rash, lesion, or ulcer.   DATA REVIEW:   CBC  Recent Labs Lab 10/23/16 0422  WBC 18.0*  HGB 14.7  HCT 42.9  PLT 356    Chemistries   Recent Labs Lab 10/23/16 0422  NA 131*    Cardiac Enzymes  Recent Labs Lab 10/22/16 0047  TROPONINI <0.03    Microbiology Results  Results for orders placed or performed during the hospital encounter of 10/13/16  Blood Culture (routine x 2)     Status: None   Collection Time: 10/13/16  7:58 PM  Result Value Ref Range Status   Specimen Description BLOOD R AC  Final   Special Requests   Final    BOTTLES DRAWN AEROBIC AND ANAEROBIC Blood Culture adequate volume  Culture NO GROWTH 5 DAYS  Final   Report Status 10/18/2016 FINAL  Final  Blood Culture (routine x 2)     Status: None   Collection Time: 10/13/16  7:58 PM  Result Value Ref Range Status   Specimen Description BLOOD L AC  Final   Special Requests   Final    BOTTLES DRAWN AEROBIC AND ANAEROBIC Blood Culture adequate volume   Culture NO GROWTH 5 DAYS  Final   Report Status 10/18/2016 FINAL  Final  MRSA PCR Screening     Status: None   Collection Time: 10/14/16 12:09 AM  Result Value Ref Range Status   MRSA by PCR NEGATIVE NEGATIVE Final    Comment:        The GeneXpert MRSA Assay (FDA approved for NASAL specimens only), is one component of a comprehensive MRSA colonization surveillance program. It is not intended to diagnose MRSA infection nor to guide or monitor treatment for MRSA infections.   Urine culture     Status: None   Collection Time: 10/14/16  3:27 AM  Result Value Ref Range Status   Specimen Description URINE, RANDOM  Final   Special Requests NONE  Final   Culture   Final    NO GROWTH Performed at [facility], [address], [place], [place] [address]    Report Status 10/15/2016 FINAL  Final  Culture, expectorated sputum-assessment     Status: None   Collection Time:  10/22/16  8:55 AM  Result Value Ref Range Status   Specimen Description SPU  Final   Special Requests NONE  Final   Sputum evaluation THIS SPECIMEN IS ACCEPTABLE FOR SPUTUM CULTURE  Final   Report Status 10/22/2016 FINAL  Final  Culture, respiratory (NON-Expectorated)     Status: None (Preliminary result)   Collection Time: 10/22/16  8:55 AM  Result Value Ref Range Status   Specimen Description SPU  Final   Special Requests NONE Reflexed from [id number]  Final   Gram Stain   Final    FEW WBC PRESENT, PREDOMINANTLY PMN FEW SQUAMOUS EPITHELIAL CELLS PRESENT MODERATE GRAM POSITIVE COCCI FEW YEAST RARE GRAM POSITIVE RODS    Culture   Final    CULTURE REINCUBATED FOR BETTER GROWTH Performed at [facility], [place] [address] [place], [place] [address]    Report Status PENDING  Incomplete    RADIOLOGY:  Dg Abd 1 View  Result Date: 10/22/2016 CLINICAL DATA:  Clinical constipation. Patient is on chronic morphine. EXAM: ABDOMEN - 1 VIEW COMPARISON:  Report of an abdominal series of January 22, 2015 FINDINGS: There is a moderate amount of gas noted within the ascending, transverse, and descending portions of the colon. There is some stool and gas in the rectosigmoid. There is no small or large bowel obstructive pattern. No free extraluminal gas collections are observed. The bony structures exhibit no acute abnormalities. There are splenic granulomas present. There may be tiny stones in both kidneys. There is calcification in the wall of the abdominal aorta and common iliac arteries. IMPRESSION: The colonic stool burden is not increased. There is a moderate amount of gas within the ascending and transverse portions of the colon with less gas noted in the descending colon and rectosigmoid. No evidence of obstruction or perforation. Electronically Signed   By: David  Martinique M.D.   On: 10/22/2016 13:17    EKG:   Orders placed or performed during the hospital encounter of 10/13/16  . ED EKG 12-Lead   .  ED EKG 12-Lead  . EKG 12-Lead  . EKG 12-Lead  . EKG 12-Lead  . EKG 12-Lead  . EKG 12-Lead  . EKG 12-Lead      Management plans discussed with the patient, family and they are in agreement.  CODE STATUS:  Code Status History    Date Active Date Inactive Code Status Order ID Comments User Context   10/14/2016 12:10 AM 10/23/2016  2:45 PM Full Code [id number]  Lance Coon, MD Inpatient      TOTAL TIME TAKING CARE OF THIS PATIENT: 40  minutes.    Theodoro Grist M.D on 10/23/2016 at 6:26 PM  Between 7am to 6pm - Pager - [phone]  After 6pm go to [email or web address] - password [email or web address] [facility]  [facility]  Office  [phone]  CC: Primary care physician; Madelyn Brunner, MD

## 2016-10-23 NOTE — Progress Notes (Signed)
Discharge packet reviewed with patient who verbalized understanding (new medications, medication changes, and prescriptions). Follow-up appts reviewed. Palliative consult to follow up with patient at home- pamphlet given to patient. Patient is stable and ready for discharge. Patient remains on 2L oxygen- tank delivered by [facility] yesterday at bedside. Patient's wife to transport home.

## 2016-10-23 NOTE — Plan of Care (Signed)
Problem: Skin Integrity: Goal: Risk for impaired skin integrity will decrease Outcome: Not Progressing Skin tear to left arm from IV tape discovered this shift.  Dressed with mepitel and explained care to patient.  Problem: Bowel/Gastric: Goal: Will not experience complications related to bowel motility Outcome: Progressing No BM this shift.  Pt given colace and milk of mag.  Pt reports feeling full but less uncomfortable after milk of mag.

## 2016-10-23 NOTE — Progress Notes (Signed)
Pt refused to wear BIPAP wanted to wear 2lpm nasal cannula. No distress noted.

## 2016-10-23 NOTE — Progress Notes (Signed)
New referral for Home Palliative received from [facility]. Patient information faxed to referral. Plan is for discharge home today. Thank you. Flo Shanks RN, BSN, [facility] [place], hospital Liaison [phone] c

## 2016-10-23 NOTE — Progress Notes (Signed)
ANTICOAGULATION CONSULT NOTE - Follow UP  Consult  Pharmacy Consult for warfarin dosing Indication: atrial fibrillation  No Known Allergies  Patient Measurements: Height: 6\' 1"  (185.4 cm) Weight: 156 lb 12.8 oz (71.1 kg) IBW/kg (Calculated) : 79.9  Vital Signs: Temp: 97.6 F (36.4 C) (06/01 0423) Temp Source: Oral (06/01 0423) BP: 140/63 (06/01 0423) Pulse Rate: 59 (06/01 0425)  Labs:  Recent Labs  10/21/16 0356 10/21/16 1701 10/21/16 2031 10/22/16 0047 10/22/16 0415 10/23/16 0422  HGB  --   --   --   --   --  14.7  HCT  --   --   --   --   --  42.9  PLT  --   --   --   --   --  356  LABPROT 37.2*  --   --   --  33.8* 24.7*  INR 3.65  --   --   --  3.24 2.19  TROPONINI  --  <0.03 <0.03 <0.03  --   --     Estimated Creatinine Clearance: 97.5 mL/min (by C-G formula based on SCr of 0.69 mg/dL).   Medical History: Past Medical History:  Diagnosis Date  . AICD (automatic cardioverter/defibrillator) present   . Atrial fibrillation ([place])   . CHF (congestive heart failure) ([place])   . COPD (chronic obstructive pulmonary disease) ([place])   . Coronary artery disease   . Hypertension   . Myocardial infarction ([place])   . Peripheral vascular disease ([place])   . Presence of permanent cardiac pacemaker     Medications:  Scheduled:  . ALPRAZolam  1 mg Oral QHS  . aspirin EC  81 mg Oral Daily  . bisacodyl  10 mg Rectal Daily  . budesonide (PULMICORT) nebulizer solution  0.5 mg Nebulization BID  . chlorhexidine  15 mL Mouth Rinse BID  . docusate sodium  100 mg Oral BID  . guaiFENesin  600 mg Oral BID  . ipratropium-albuterol  3 mL Nebulization Q4H  . levofloxacin  500 mg Oral Daily  . methylPREDNISolone (SOLU-MEDROL) injection  40 mg Intravenous Q12H  . metoprolol succinate  100 mg Oral Q breakfast  . nitroGLYCERIN  1 inch Topical Q6H  . pantoprazole  40 mg Oral Daily  . pneumococcal 23 valent vaccine  0.5 mL Intramuscular Tomorrow-1000  . polyethylene glycol  17 g Oral  Daily  . pravastatin  20 mg Oral q1800  . ramipril  10 mg Oral BID  . simethicone  80 mg Oral QID  . sodium phosphate  1 enema Rectal Once  . tiotropium  1 capsule Inhalation Daily  . warfarin  3 mg Oral ONCE-1800  . Warfarin - Pharmacist Dosing Inpatient   Does not apply q1800    Assessment: Patient admitted w/ SOB and is anticoagulated w/ warfarin for chronic afib. Home dose per med rec: warfarin 5 mg daily at 6 pm  5/23 INR 2.56    5 mg 5/24: INR: 2.33  5 mg 5/25: INR: 2.69 5 mg 5/26: INR; 3.23            HOLD 5/27: INR: 3.08           3 mg  5/28: INR: 2.48          5 mg 5/29  INR: 2.86 1.5mg  5/30  INR: 3.65             HOLD  5/31 INR   3.24 1mg   6/1   INR   2.14  5mg    Goal of Therapy:  INR 2-3 Monitor platelets by anticoagulation protocol: Yes   Plan: 5/30 INR now therapeutic, but patient is still receiving levofloxacin. Will give 1 time dose of Warfarin 5mg .    Will follow daily INR levels while on ABx per protocol.   Pernell Dupre, PharmD, BCPS Clinical Pharmacist 10/23/2016 8:07 AM

## 2016-10-24 LAB — CULTURE, RESPIRATORY W GRAM STAIN: Culture: NORMAL

## 2016-10-24 LAB — CULTURE, RESPIRATORY

## 2016-10-28 ENCOUNTER — Ambulatory Visit: Payer: Medicare Other | Admitting: Family

## 2016-11-09 ENCOUNTER — Encounter: Payer: Self-pay | Admitting: Internal Medicine

## 2016-11-09 ENCOUNTER — Ambulatory Visit (INDEPENDENT_AMBULATORY_CARE_PROVIDER_SITE_OTHER): Payer: 59 | Admitting: Internal Medicine

## 2016-11-09 VITALS — BP 142/84 | HR 78 | Ht 73.0 in | Wt 147.0 lb

## 2016-11-09 DIAGNOSIS — J449 Chronic obstructive pulmonary disease, unspecified: Secondary | ICD-10-CM

## 2016-11-09 DIAGNOSIS — I5022 Chronic systolic (congestive) heart failure: Secondary | ICD-10-CM | POA: Diagnosis not present

## 2016-11-09 MED ORDER — TIOTROPIUM BROMIDE MONOHYDRATE 1.25 MCG/ACT IN AERS: 2.0000 | INHALATION_SPRAY | Freq: Every day | RESPIRATORY_TRACT | 5 refills | Status: DC

## 2016-11-09 NOTE — Progress Notes (Signed)
Name: Tyler [name] MRN:   [id number] DOB:   [birth year]             CONSULTATION DATE:  11/09/2016   REFERRING MD :  Dr. Bridgett Larsson   CHIEF COMPLAINT: Shortness of Breath/COPD  BRIEF PATIENT DESCRIPTION:  61 yo old male admitted with 05/22 with acute on chronic hypercapnic hypoxic respiratory failure secondary to AECOPD   STUDIES:  CXR 10/21/16 I have Independently reviewed images of  cxr   on 11/09/2016 Interpretation: No evidence of opacifications no effusions    HISTORY OF PRESENT ILLNESS:   This is a 61 yo male with a PMH of Cardiac Pacemaker, PVD, HTN, Myocardial Infarction, CAD, COPD, Chronic Systolic CHF, Atrial Fibrillation, and AICD.    He presented to [facility] ER 05/22 with c/o worsening shortness of breath with chest congestion onset of symptoms several days prior to presentation to the ER.  due to worsening labored breathing pt notified EMS and upon EMS arrival the pt was found to be hypoxic with O2 sats in the low 90's. Therefore, he was placed on CPAP and given 2 albuterol treatments, 1 duoneb treatment, and solumedrol en route to the ER with minimal improvement of symptoms.  In the ER the pt was placed on continuous Bipap and admitted to the [facility].  Hosp course-Steroids, ABX and BD therapy patient discharged home with oxygen  Patient sees Dr. Maylene Roes at [facility] for management of his COPD PFTs reports show severe obstructive airway disease with air trapping and reduced DLCO  Outpatient inhaler therapy includes Advair Spiriva and albuterol as needed  Patient remained short of breath with dyspnea on exertion No signs of infection at this time  No signs of CHF at this time  Patient was diagnosed with COPD many years ago he was told he has stage IV Patient was referred to [facility] lung transplant services and was denied next line patient is a former smoker a pack a day for approximately 80 years Quit 3 years ago 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  Mother died of  lung cancer Father died of kidney cancer  Patient currently on Advair 115 twice daily Patient also is on Spiriva 1.25 Patient is currently on oxygen therapy 24/7 Patient states that oxygen therapy helps his resp status tremendously    PAST MEDICAL HISTORY :   has a past medical history of AICD (automatic cardioverter/defibrillator) present; Atrial fibrillation ([place]); CHF (congestive heart failure) ([place]); COPD (chronic obstructive pulmonary disease) ([place]); Coronary artery disease; Hypertension; Myocardial infarction ([place]); Peripheral vascular disease ([place]); and Presence of permanent cardiac pacemaker.  has a past surgical history that includes Below knee leg amputation (Left); Insert / replace / remove pacemaker; Cardiac catheterization (Left, 12/09/2015); and Cardiac catheterization (12/09/2015).        Prior to Admission medications   Medication Sig Start Date End Date Taking? Authorizing Provider  albuterol (PROVENTIL HFA;VENTOLIN HFA) 108 (90 BASE) MCG/ACT inhaler Inhale 2 puffs into the lungs.   Yes [provider]  albuterol-ipratropium (COMBIVENT) 18-103 MCG/ACT inhaler Inhale 1 puff into the lungs every 4 (four) hours.   Yes [provider]  ALPRAZolam Duanne Moron) 1 MG tablet Take 1 mg by mouth at bedtime.   Yes [provider]  aspirin EC 81 MG tablet Take 1 tablet (81 mg total) by mouth daily. 12/09/15  Yes [name], Erskine Squibb, MD  calcium carbonate (TUMS - DOSED IN MG ELEMENTAL CALCIUM) 500 MG chewable tablet 1 tablet daily. prn   Yes [provider]  clotrimazole-betamethasone (LOTRISONE) cream Apply 1 application topically 2 (two) times daily.   Yes [provider]  docusate sodium (COLACE) 100 MG capsule Take 100 mg by mouth every other day.   Yes [provider]  fluticasone-salmeterol (ADVAIR HFA) 115-21 MCG/ACT inhaler USE 2 INHALATIONS ORALLY   EVERY 12 HOURS 04/06/16  Yes [provider]  levalbuterol (XOPENEX)  1.25 MG/3ML nebulizer solution Take 3 mLs by nebulization every 6 (six) hours as needed for wheezing.   Yes [provider]  lovastatin (MEVACOR) 20 MG tablet Take 20 mg by mouth at bedtime.   Yes [provider]  metoprolol succinate (TOPROL-XL) 100 MG 24 hr tablet Take 100 mg by mouth daily. Take with or immediately following a meal.   Yes [provider]  omeprazole (PRILOSEC) 20 MG capsule Take 20 mg by mouth daily.   Yes [provider]  ramipril (ALTACE) 10 MG capsule Take 10 mg by mouth daily.   Yes [provider]  SPIRIVA RESPIMAT 1.25 MCG/ACT AERS Inhale 2 puffs into the lungs daily.  04/09/16  Yes [provider]  warfarin (COUMADIN) 5 MG tablet Take 5 mg by mouth one time only at 6 PM.    Yes [provider]   No Known Allergies  FAMILY HISTORY:  family history includes Cancer in his father and mother; Heart disease in his father. SOCIAL HISTORY:  reports that he quit smoking about 3 years ago. His smoking use included Cigarettes. He has a 42.00 pack-year smoking history. He has never used smokeless tobacco. He reports that he does not drink alcohol or use drugs.  REVIEW OF SYSTEMS:  Positives in BOLD  Constitutional: Negative for fever, chills, weight loss, malaise/fatigue and diaphoresis.  HENT: Negative for hearing loss, ear pain, nosebleeds, congestion, sore throat, neck pain, tinnitus and ear discharge.   Eyes: Negative for blurred vision, double vision, photophobia, pain, discharge and redness.  Respiratory: + cough, hemoptysis, -sputum production, + shortness of breath, + chest congestion,  + wheezing and stridor.   Cardiovascular: Negative for chest pain, palpitations, orthopnea, claudication, leg swelling and PND.  Gastrointestinal: Negative for heartburn, nausea, vomiting, abdominal pain, diarrhea, constipation, blood in stool and melena.  Genitourinary: Negative for dysuria, urgency, frequency,  hematuria and flank pain.  Musculoskeletal: Negative for myalgias, back pain, joint pain and falls.  Skin: Negative for itching and rash.  Neurological: Negative for dizziness, tingling, tremors, sensory change, speech change, focal weakness, seizures, loss of consciousness, weakness and headaches.  Endo/Heme/Allergies: Negative for environmental allergies and polydipsia. Does not bruise/bleed easily.   VITAL SIGNS: BP (!) 142/84 (BP Location: Left Arm, Cuff Size: Normal)   Pulse 78   Ht 6\' 1"  (1.854 m)   Wt 147 lb (66.7 kg)   SpO2 97%   BMI 19.39 kg/m    PHYSICAL EXAMINATION: General: well developed, well nourished Caucasian male, NAD Neuro: alert and oriented, follows commands HEENT: supple, no JVD Cardiovascular: irregular irregular, no M/R/G Lungs: diminished throughout, even, non labored CTA B/L Abdomen: +BS x4, soft, non tender, non distended Musculoskeletal: right aka, normal tone Skin: intact no rashes or lesions  ASSESSMENT / PLAN: 61 year old pleasant white male with multiple medical problems including CHF, very severe COPD with recent admission to the hospital for Acute on chronic hypoxic hypercapnic respiratory failure secondary to AECOPD with Chronic Atrial Fibrillation And h/o Pacemaker, HTN, CAD, AICD, and MI with chronic hypoxic respiratory failure   #1 shortness of breath and dyspnea on exertion Related  to multifactorial issues with underlying chronic CHF underlying severe COPD in the setting of deconditioned state  #2 COPD severe Gold stage D Continue inhalers as prescribed patient is currently on Advair and Spiriva Continue Spiriva as prescribed 1.25 dose Continue Advair as prescribed 115 dose No indication for antibiotics at this time No indication for systemic oral prednisone at this time  #3 allergic rhinitis Continue antihistamine with Claritin as prescribed   #4 chronic hypoxic restaurant failure I have advised patient to use oxygen when needed  especially during exertion and also at night   #5 Extensive smoking history with severe COPD Patient is a candidate for lung cancer in cancer screening referral program Patient is set for CT scan at [facility]  #6 atrial fibrillation Exline continue Coumadin therapy as prescribed  #7 systolic CHF Follow up with cardiology as scheduled Continue Lasix as prescribed  #8 deconditioned state Increase exercise capacity is tolerated this will be difficult to do in the setting of right AKA   Patient/Family are satisfied with Plan of action and management. All questions answered Follow-up in 6 months with interval changes   Corrin Parker, M.D.  [facility]  Medical Director [place] Director [facility] Cardio-Pulmonary Department

## 2016-11-09 NOTE — Patient Instructions (Addendum)
Continue inhalers as prescribed Follow up CT chest Lung cancer screening from [facility] Increase Spiriva Respimat to 2.5

## 2016-11-13 ENCOUNTER — Ambulatory Visit (INDEPENDENT_AMBULATORY_CARE_PROVIDER_SITE_OTHER): Payer: 59

## 2016-11-13 ENCOUNTER — Encounter (INDEPENDENT_AMBULATORY_CARE_PROVIDER_SITE_OTHER): Payer: Self-pay | Admitting: Vascular Surgery

## 2016-11-13 ENCOUNTER — Ambulatory Visit (INDEPENDENT_AMBULATORY_CARE_PROVIDER_SITE_OTHER): Payer: 59 | Admitting: Vascular Surgery

## 2016-11-13 VITALS — BP 113/73 | HR 90 | Resp 17 | Wt 145.0 lb

## 2016-11-13 DIAGNOSIS — I480 Paroxysmal atrial fibrillation: Secondary | ICD-10-CM

## 2016-11-13 DIAGNOSIS — I739 Peripheral vascular disease, unspecified: Secondary | ICD-10-CM

## 2016-11-13 DIAGNOSIS — Z89611 Acquired absence of right leg above knee: Secondary | ICD-10-CM | POA: Diagnosis not present

## 2016-11-13 DIAGNOSIS — I10 Essential (primary) hypertension: Secondary | ICD-10-CM

## 2016-11-13 NOTE — Assessment & Plan Note (Signed)
His noninvasive studies today demonstrate a patent left SFA and popliteal stent. The common femoral artery, profunda femoris artery origin and superficial femoral artery origin have 50-74% stenosis by duplex criteria. His overall ABI remains 1.3 with a fair digital pressure distally. No intervention is currently required. He is reasonably high risk so we will continue to watch this on 6 month intervals with noninvasive studies at this point. Continue current medical regimen.

## 2016-11-13 NOTE — Assessment & Plan Note (Signed)
On anticoagulation 

## 2016-11-13 NOTE — Assessment & Plan Note (Signed)
blood pressure control important in reducing the progression of atherosclerotic disease. On appropriate oral medications.  

## 2016-11-13 NOTE — Progress Notes (Signed)
MRN : [id number]  Tyler [name] is a 61 y.o. (1956-05-01) male who presents with chief complaint of  Chief Complaint  Patient presents with  . Follow-up  .  History of Present Illness: Patient returns today in follow up of PAD. The patient reports worsening pulmonary status since his last visit. He is now on continuous oxygen he does not have any new ulceration, infection, or ischemic rest pain of the left leg. He does not really have lifestyle limiting claudication. 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 His noninvasive studies today demonstrate a patent left SFA and popliteal stent. The common femoral artery, profunda femoris artery origin and superficial femoral artery origin have 50-74% stenosis by duplex criteria. His overall ABI remains 1.3 with a fair digital pressure distally.  Current Outpatient Prescriptions  Medication Sig Dispense Refill  . albuterol-ipratropium (COMBIVENT) 18-103 MCG/ACT inhaler Inhale 1 puff into the lungs every 4 (four) hours.    . ALPRAZolam (XANAX) 1 MG tablet Take 1 mg by mouth at bedtime.    Marland Kitchen aspirin EC 81 MG tablet Take 1 tablet (81 mg total) by mouth daily. 150 tablet 2  . calcium carbonate (TUMS - DOSED IN MG ELEMENTAL CALCIUM) 500 MG chewable tablet 1 tablet daily. prn    . clotrimazole-betamethasone (LOTRISONE) cream Apply 1 application topically 2 (two) times daily.    Marland Kitchen docusate sodium (COLACE) 100 MG capsule Take 100 mg by mouth every other day.    . fluticasone-salmeterol (ADVAIR HFA) 115-21 MCG/ACT inhaler USE 2 INHALATIONS ORALLY   EVERY 12 HOURS    . guaiFENesin (ROBITUSSIN) 100 MG/5ML SOLN Take 5 mLs (100 mg total) by mouth every 4 (four) hours as needed for cough or to loosen phlegm. 1200 mL 0  . levalbuterol (XOPENEX) 1.25 MG/3ML nebulizer solution Take 1.25 mg (3 mLs total) by nebulization every 4 (four) hours. 72 mL 12  . lovastatin (MEVACOR) 20 MG tablet Take 20 mg by mouth at  bedtime.    . metoprolol succinate (TOPROL-XL) 100 MG 24 hr tablet Take 100 mg by mouth daily. Take with or immediately following a meal.    . omeprazole (PRILOSEC) 20 MG capsule Take 20 mg by mouth daily.    . polyethylene glycol (MIRALAX / GLYCOLAX) packet Take 17 g by mouth daily. 14 each 0  . ramipril (ALTACE) 10 MG capsule Take 10 mg by mouth daily.    . simethicone (MYLICON) 80 MG chewable tablet Chew 1 tablet (80 mg total) by mouth 4 (four) times daily. 30 tablet 0  . Tiotropium Bromide Monohydrate (SPIRIVA RESPIMAT) 1.25 MCG/ACT AERS Inhale 2 puffs into the lungs daily. 4 g 5  . warfarin (COUMADIN) 5 MG tablet Take 5 mg by mouth one time only at 6 PM.      No current facility-administered medications for this visit.         Past Medical History:  Diagnosis Date  . AICD (automatic cardioverter/defibrillator) present   . COPD (chronic obstructive pulmonary disease) ([place])   . Coronary artery disease   . Hypertension   . Myocardial infarction   . Peripheral vascular disease ([place])   . Presence of permanent cardiac pacemaker          Past Surgical History:  Procedure Laterality Date  . BELOW KNEE LEG AMPUTATION Left   . INSERT / REPLACE / REMOVE PACEMAKER    . PERIPHERAL VASCULAR CATHETERIZATION Left 12/09/2015   Procedure: Lower Extremity  Angiography;  Surgeon: Algernon Huxley, MD;  Location: [facility];  Service: Cardiovascular;  Laterality: Left;  . PERIPHERAL VASCULAR CATHETERIZATION  12/09/2015   Procedure: Lower Extremity Intervention;  Surgeon: Algernon Huxley, MD;  Location: [facility];  Service: Cardiovascular;;    Social History      Social History  Substance Use Topics  . Smoking status: Former Smoker    Packs/day: 1.00    Years: 42.00    Types: Cigarettes    Quit date: 09/23/2013  . Smokeless tobacco: Not on file  . Alcohol use No    Family History      Family History  Problem Relation Age of Onset  . Cancer Mother    . Cancer Father   . Heart disease Father     No Known Allergies   REVIEW OF SYSTEMS (Negative unless checked)  Constitutional: _0 Weight loss  _1 Fever  _2 Chills Cardiac: _3 Chest pain   _4 Chest pressure   _5 Palpitations   _6 Shortness of breath when laying flat   _7 Shortness of breath at rest   _8 Shortness of breath with exertion. Vascular:  _9 Pain in legs with walking   _10 Pain in legs at rest   _11 Pain in legs when laying flat   _12 Claudication   _13 Pain in feet when walking  _14 Pain in feet at rest  _15 Pain in feet when laying flat   _16 History of DVT   _17 Phlebitis   _18 Swelling in legs   _19 Varicose veins   _20 Non-healing ulcers Pulmonary:   _21 Uses home oxygen   _22 Productive cough   _23 Hemoptysis   _24 Wheeze  _25 COPD   _26 Asthma Neurologic:  _27 Dizziness  _28 Blackouts   _29 Seizures   _30 History of stroke   _31 History of TIA  _32 Aphasia   _33 Temporary blindness   _34 Dysphagia   _35 Weakness or numbness in arms   _36 Weakness or numbness in legs Musculoskeletal:  _37 Arthritis   _38 Joint swelling   _39 Joint pain   _40 Low back pain Hematologic:  _41 Easy bruising  _42 Easy bleeding   _43 Hypercoagulable state   _44 Anemic   Gastrointestinal:  _45 Blood in stool   _46 Vomiting blood  _47 Gastroesophageal reflux/heartburn   _48 Abdominal pain Genitourinary:  _49 Chronic kidney disease   _50 Difficult urination  _51 Frequent urination  _52 Burning with urination   _53 Hematuria Skin:  _54 Rashes   _55 Ulcers   _56 Wounds Psychological:  _57 History of anxiety   _58  History of major depression.    Physical Examination  BP 113/73   Pulse 90   Resp 17   Wt 145 lb (65.8 kg)   BMI 19.13 kg/m  Gen:  WD/WN, NAD Head: [place]/AT, No temporalis wasting. Ear/Nose/Throat: Hearing grossly intact, nares w/o erythema or drainage, trachea midline Eyes: Conjunctiva clear. Sclera non-icteric Neck: Supple.  No JVD.  Pulmonary:  Good air movement, no use of accessory muscles, respirations not labored on supplemental oxygen.  Cardiac: RRR, normal S1,  S2 Vascular:  Vessel Right Left  Radial Palpable Palpable                      Popliteal Not Palpable 1+ Palpable  PT Not Palpable 1+ Palpable  DP Not Palpable 1+ Palpable    Musculoskeletal: M/S 5/5 throughout.  Right AKA Neurologic: Sensation grossly intact in extremities.  Symmetrical.  Speech is fluent.  Psychiatric: Judgment intact, Mood & affect appropriate for pt's clinical situation. Dermatologic: No rashes or ulcers noted.  No cellulitis or open wounds.       Labs Recent Results (from the past 2160 hour(s))  Lactic acid,  plasma     Status: Abnormal   Collection Time: 10/13/16  7:58 PM  Result Value Ref Range   Lactic Acid, Venous 3.0 (HH) 0.5 - 1.9 mmol/L    Comment: CRITICAL RESULT CALLED TO, READ BACK BY AND VERIFIED WITH STEPHEN JONES AT 2101 ON 10/13/2016 JJB   Troponin I     Status: None   Collection Time: 10/13/16  7:58 PM  Result Value Ref Range   Troponin I <0.03 <0.03 ng/mL  CBC WITH DIFFERENTIAL     Status: Abnormal   Collection Time: 10/13/16  7:58 PM  Result Value Ref Range   WBC 11.3 (H) 3.8 - 10.6 K/uL   RBC 5.11 4.40 - 5.90 MIL/uL   Hemoglobin 14.8 13.0 - 18.0 g/dL   HCT 44.8 40.0 - 52.0 %   MCV 87.8 80.0 - 100.0 fL   MCH 29.0 26.0 - 34.0 pg   MCHC 33.1 32.0 - 36.0 g/dL   RDW 14.0 11.5 - 14.5 %   Platelets 298 150 - 440 K/uL   Neutrophils Relative % 69 %   Neutro Abs 7.9 (H) 1.4 - 6.5 K/uL   Lymphocytes Relative 16 %   Lymphs Abs 1.8 1.0 - 3.6 K/uL   Monocytes Relative 13 %   Monocytes Absolute 1.4 (H) 0.2 - 1.0 K/uL   Eosinophils Relative 1 %   Eosinophils Absolute 0.1 0 - 0.7 K/uL   Basophils Relative 1 %   Basophils Absolute 0.1 0 - 0.1 K/uL  Blood Culture (routine x 2)     Status: None   Collection Time: 10/13/16  7:58 PM  Result Value Ref Range   Specimen Description BLOOD R AC    Special Requests      BOTTLES DRAWN AEROBIC AND ANAEROBIC Blood Culture adequate volume   Culture NO GROWTH 5 DAYS    Report Status 10/18/2016  FINAL   Blood Culture (routine x 2)     Status: None   Collection Time: 10/13/16  7:58 PM  Result Value Ref Range   Specimen Description BLOOD L AC    Special Requests      BOTTLES DRAWN AEROBIC AND ANAEROBIC Blood Culture adequate volume   Culture NO GROWTH 5 DAYS    Report Status 10/18/2016 FINAL   Comprehensive metabolic panel     Status: Abnormal   Collection Time: 10/13/16  7:58 PM  Result Value Ref Range   Sodium 130 (L) 135 - 145 mmol/L   Potassium 4.2 3.5 - 5.1 mmol/L   Chloride 94 (L) 101 - 111 mmol/L   CO2 26 22 - 32 mmol/L   Glucose, Bld 154 (H) 65 - 99 mg/dL   BUN 14 6 - 20 mg/dL   Creatinine, Ser 1.07 0.61 - 1.24 mg/dL   Calcium 9.3 8.9 - 10.3 mg/dL   Total Protein 7.6 6.5 - 8.1 g/dL   Albumin 4.4 3.5 - 5.0 g/dL   AST 43 (H) 15 - 41 U/L   ALT 25 17 - 63 U/L   Alkaline Phosphatase 89 38 - 126 U/L   Total Bilirubin 1.0 0.3 - 1.2 mg/dL   GFR calc non Af Amer >60 >60 mL/min   GFR calc Af Amer >60 >60 mL/min    Comment: (NOTE) The eGFR has been calculated using the CKD EPI equation. This calculation has not been validated in all clinical situations. eGFR's persistently <60 mL/min signify possible Chronic Kidney Disease.    Anion gap 10 5 - 15  Brain natriuretic peptide  Status: Abnormal   Collection Time: 10/13/16  7:58 PM  Result Value Ref Range   B Natriuretic Peptide 321.0 (H) 0.0 - 100.0 pg/mL  Magnesium     Status: None   Collection Time: 10/13/16  7:58 PM  Result Value Ref Range   Magnesium 1.8 1.7 - 2.4 mg/dL  Blood gas, venous ([facility], [facility], [facility])     Status: Abnormal   Collection Time: 10/13/16  7:59 PM  Result Value Ref Range   Delivery systems BILEVEL POSITIVE AIRWAY PRESSURE    pH, Ven 7.19 (LL) 7.250 - 7.430    Comment: CRITICAL RESULT CALLED TO, READ BACK BY AND VERIFIED WITH: NMDCR DR. Quentin Cornwall AT 2015 ON 10/13/16 KSL    pCO2, Ven 66 (H) 44.0 - 60.0 mmHg   pO2, Ven <31.0 (LL) 32.0 - 45.0 mmHg    Comment: CRITICAL RESULT, NOTIFIED PHYSICIAN DR.  Quentin Cornwall AT 2015 ON 10/13/16 KSL    Bicarbonate 25.2 20.0 - 28.0 mmol/L   Acid-base deficit 4.4 (H) 0.0 - 2.0 mmol/L   Patient temperature 37.0    Collection site VENOUS    Sample type VENOUS   Lactic acid, plasma     Status: None   Collection Time: 10/13/16 11:14 PM  Result Value Ref Range   Lactic Acid, Venous 1.4 0.5 - 1.9 mmol/L  Glucose, capillary     Status: Abnormal   Collection Time: 10/14/16 12:05 AM  Result Value Ref Range   Glucose-Capillary 155 (H) 65 - 99 mg/dL  MRSA PCR Screening     Status: None   Collection Time: 10/14/16 12:09 AM  Result Value Ref Range   MRSA by PCR NEGATIVE NEGATIVE    Comment:        The GeneXpert MRSA Assay (FDA approved for NASAL specimens only), is one component of a comprehensive MRSA colonization surveillance program. It is not intended to diagnose MRSA infection nor to guide or monitor treatment for MRSA infections.   Basic metabolic panel     Status: Abnormal   Collection Time: 10/14/16  1:03 AM  Result Value Ref Range   Sodium 132 (L) 135 - 145 mmol/L   Potassium 3.8 3.5 - 5.1 mmol/L   Chloride 97 (L) 101 - 111 mmol/L   CO2 25 22 - 32 mmol/L   Glucose, Bld 138 (H) 65 - 99 mg/dL   BUN 16 6 - 20 mg/dL   Creatinine, Ser 0.78 0.61 - 1.24 mg/dL   Calcium 9.1 8.9 - 10.3 mg/dL   GFR calc non Af Amer >60 >60 mL/min   GFR calc Af Amer >60 >60 mL/min    Comment: (NOTE) The eGFR has been calculated using the CKD EPI equation. This calculation has not been validated in all clinical situations. eGFR's persistently <60 mL/min signify possible Chronic Kidney Disease.    Anion gap 10 5 - 15  CBC     Status: Abnormal   Collection Time: 10/14/16  1:03 AM  Result Value Ref Range   WBC 12.5 (H) 3.8 - 10.6 K/uL   RBC 4.72 4.40 - 5.90 MIL/uL   Hemoglobin 13.6 13.0 - 18.0 g/dL   HCT 40.7 40.0 - 52.0 %   MCV 86.2 80.0 - 100.0 fL   MCH 28.7 26.0 - 34.0 pg   MCHC 33.3 32.0 - 36.0 g/dL   RDW 13.8 11.5 - 14.5 %   Platelets 221 150 - 440  K/uL  Protime-INR     Status: Abnormal   Collection Time: 10/14/16  1:03 AM  Result Value Ref Range   Prothrombin Time 28.0 (H) 11.4 - 15.2 seconds   INR 2.56   Urine culture     Status: None   Collection Time: 10/14/16  3:27 AM  Result Value Ref Range   Specimen Description URINE, RANDOM    Special Requests NONE    Culture      NO GROWTH Performed at [facility], [place] [address], [place],  [address]    Report Status 10/15/2016 FINAL   Urinalysis, Complete w Microscopic     Status: Abnormal   Collection Time: 10/14/16  3:27 AM  Result Value Ref Range   Color, Urine YELLOW (A) YELLOW   APPearance CLEAR (A) CLEAR   Specific Gravity, Urine 1.015 1.005 - 1.030   pH 5.0 5.0 - 8.0   Glucose, UA 50 (A) NEGATIVE mg/dL   Hgb urine dipstick MODERATE (A) NEGATIVE   Bilirubin Urine NEGATIVE NEGATIVE   Ketones, ur 20 (A) NEGATIVE mg/dL   Protein, ur 30 (A) NEGATIVE mg/dL   Nitrite NEGATIVE NEGATIVE   Leukocytes, UA NEGATIVE NEGATIVE   RBC / HPF 0-5 0 - 5 RBC/hpf   WBC, UA NONE SEEN 0 - 5 WBC/hpf   Bacteria, UA NONE SEEN NONE SEEN   Squamous Epithelial / LPF NONE SEEN NONE SEEN   Mucous PRESENT    Hyaline Casts, UA PRESENT   Protime-INR     Status: Abnormal   Collection Time: 10/14/16  5:41 AM  Result Value Ref Range   Prothrombin Time 27.3 (H) 11.4 - 15.2 seconds   INR 2.48   Protime-INR     Status: Abnormal   Collection Time: 10/15/16  3:36 AM  Result Value Ref Range   Prothrombin Time 26.0 (H) 11.4 - 15.2 seconds   INR 2.33   CBC     Status: Abnormal   Collection Time: 10/15/16  3:36 AM  Result Value Ref Range   WBC 16.0 (H) 3.8 - 10.6 K/uL   RBC 4.42 4.40 - 5.90 MIL/uL   Hemoglobin 13.1 13.0 - 18.0 g/dL   HCT 38.3 (L) 40.0 - 52.0 %   MCV 86.6 80.0 - 100.0 fL   MCH 29.6 26.0 - 34.0 pg   MCHC 34.1 32.0 - 36.0 g/dL   RDW 13.9 11.5 - 14.5 %   Platelets 219 150 - 440 K/uL  Magnesium     Status: None   Collection Time: 10/15/16  3:36 AM  Result Value Ref Range    Magnesium 1.8 1.7 - 2.4 mg/dL  Phosphorus     Status: None   Collection Time: 10/15/16  3:36 AM  Result Value Ref Range   Phosphorus 2.8 2.5 - 4.6 mg/dL  Basic metabolic panel     Status: Abnormal   Collection Time: 10/15/16  3:36 AM  Result Value Ref Range   Sodium 133 (L) 135 - 145 mmol/L   Potassium 3.9 3.5 - 5.1 mmol/L   Chloride 99 (L) 101 - 111 mmol/L   CO2 27 22 - 32 mmol/L   Glucose, Bld 120 (H) 65 - 99 mg/dL   BUN 15 6 - 20 mg/dL   Creatinine, Ser 0.69 0.61 - 1.24 mg/dL   Calcium 9.1 8.9 - 10.3 mg/dL   GFR calc non Af Amer >60 >60 mL/min   GFR calc Af Amer >60 >60 mL/min    Comment: (NOTE) The eGFR has been calculated using the CKD EPI equation. This calculation has not been validated in all clinical situations. eGFR's  persistently <60 mL/min signify possible Chronic Kidney Disease.    Anion gap 7 5 - 15  Protime-INR     Status: Abnormal   Collection Time: 10/16/16  4:10 AM  Result Value Ref Range   Prothrombin Time 29.1 (H) 11.4 - 15.2 seconds   INR 2.69   Blood gas, arterial     Status: None   Collection Time: 10/16/16  6:10 PM  Result Value Ref Range   pH, Arterial 7.38 7.350 - 7.450   pCO2 arterial 46 32.0 - 48.0 mmHg   pO2, Arterial 83 83.0 - 108.0 mmHg   Bicarbonate 27.2 20.0 - 28.0 mmol/L   Acid-Base Excess 1.5 0.0 - 2.0 mmol/L   O2 Saturation 95.9 %   Patient temperature 37.0    Collection site REVIEWED BY    Sample type ARTERIAL DRAW    Allens test (pass/fail) PASS PASS  Protime-INR     Status: Abnormal   Collection Time: 10/17/16  5:03 AM  Result Value Ref Range   Prothrombin Time 33.7 (H) 11.4 - 15.2 seconds   INR 3.23   Protime-INR     Status: Abnormal   Collection Time: 10/18/16  4:47 AM  Result Value Ref Range   Prothrombin Time 32.5 (H) 11.4 - 15.2 seconds   INR 3.08   CBC     Status: Abnormal   Collection Time: 10/18/16  4:47 AM  Result Value Ref Range   WBC 11.7 (H) 3.8 - 10.6 K/uL   RBC 4.73 4.40 - 5.90 MIL/uL   Hemoglobin 14.0  13.0 - 18.0 g/dL   HCT 41.3 40.0 - 52.0 %   MCV 87.4 80.0 - 100.0 fL   MCH 29.6 26.0 - 34.0 pg   MCHC 33.9 32.0 - 36.0 g/dL   RDW 13.6 11.5 - 14.5 %   Platelets 243 150 - 440 K/uL  Protime-INR     Status: Abnormal   Collection Time: 10/19/16  4:38 AM  Result Value Ref Range   Prothrombin Time 27.3 (H) 11.4 - 15.2 seconds   INR 2.48   Protime-INR     Status: Abnormal   Collection Time: 10/20/16  4:03 AM  Result Value Ref Range   Prothrombin Time 30.6 (H) 11.4 - 15.2 seconds   INR 2.86   Fibrin derivatives D-Dimer ([facility] only)     Status: None   Collection Time: 10/20/16  3:26 PM  Result Value Ref Range   Fibrin derivatives D-dimer ([facility]) 155.38 0.00 - 499.00    Comment: (NOTE) <> Exclusion of Venous Thromboembolism (VTE) - OUTPATIENT ONLY   (Emergency Department or [place])   0-499 ng/ml (FEU): With a low to intermediate pretest probability                      for VTE this test result excludes the diagnosis                      of VTE.   >499 ng/ml (FEU) : VTE not excluded; additional work up for VTE is                      required. <> Testing on Inpatients and Evaluation of Disseminated Intravascular   Coagulation (DIC) Reference Range:   0-499 ng/ml (FEU)   Protime-INR     Status: Abnormal   Collection Time: 10/21/16  3:56 AM  Result Value Ref Range   Prothrombin Time 37.2 (H) 11.4 - 15.2 seconds  INR 3.65   Sodium     Status: Abnormal   Collection Time: 10/21/16  3:56 AM  Result Value Ref Range   Sodium 131 (L) 135 - 145 mmol/L  Osmolality, urine     Status: None   Collection Time: 10/21/16  2:18 PM  Result Value Ref Range   Osmolality, Ur 466 300 - 900 mOsm/kg  Troponin I     Status: None   Collection Time: 10/21/16  5:01 PM  Result Value Ref Range   Troponin I <0.03 <0.03 ng/mL  Troponin I     Status: None   Collection Time: 10/21/16  8:31 PM  Result Value Ref Range   Troponin I <0.03 <0.03 ng/mL  Troponin I     Status: None   Collection Time: 10/22/16 12:47  AM  Result Value Ref Range   Troponin I <0.03 <0.03 ng/mL  Protime-INR     Status: Abnormal   Collection Time: 10/22/16  4:15 AM  Result Value Ref Range   Prothrombin Time 33.8 (H) 11.4 - 15.2 seconds   INR 3.24   Sodium     Status: Abnormal   Collection Time: 10/22/16  4:15 AM  Result Value Ref Range   Sodium 132 (L) 135 - 145 mmol/L  Culture, expectorated sputum-assessment     Status: None   Collection Time: 10/22/16  8:55 AM  Result Value Ref Range   Specimen Description SPU    Special Requests NONE    Sputum evaluation THIS SPECIMEN IS ACCEPTABLE FOR SPUTUM CULTURE    Report Status 10/22/2016 FINAL   Culture, respiratory (NON-Expectorated)     Status: None   Collection Time: 10/22/16  8:55 AM  Result Value Ref Range   Specimen Description SPU    Special Requests NONE Reflexed from [id number]    Gram Stain      FEW WBC PRESENT, PREDOMINANTLY PMN FEW SQUAMOUS EPITHELIAL CELLS PRESENT MODERATE GRAM POSITIVE COCCI FEW YEAST RARE GRAM POSITIVE RODS    Culture      Consistent with normal respiratory flora. Performed at [facility], [place] [address] [place], [place] [address]    Report Status 10/24/2016 FINAL   Protime-INR     Status: Abnormal   Collection Time: 10/23/16  4:22 AM  Result Value Ref Range   Prothrombin Time 24.7 (H) 11.4 - 15.2 seconds   INR 2.19   CBC     Status: Abnormal   Collection Time: 10/23/16  4:22 AM  Result Value Ref Range   WBC 18.0 (H) 3.8 - 10.6 K/uL   RBC 4.94 4.40 - 5.90 MIL/uL   Hemoglobin 14.7 13.0 - 18.0 g/dL   HCT 42.9 40.0 - 52.0 %   MCV 86.9 80.0 - 100.0 fL   MCH 29.7 26.0 - 34.0 pg   MCHC 34.2 32.0 - 36.0 g/dL   RDW 13.9 11.5 - 14.5 %   Platelets 356 150 - 440 K/uL  Sodium     Status: Abnormal   Collection Time: 10/23/16  4:22 AM  Result Value Ref Range   Sodium 131 (L) 135 - 145 mmol/L    Radiology Dg Abd 1 View  Result Date: 10/22/2016 CLINICAL DATA:  Clinical constipation. Patient is on chronic morphine. EXAM:  ABDOMEN - 1 VIEW COMPARISON:  Report of an abdominal series of January 22, 2015 FINDINGS: There is a moderate amount of gas noted within the ascending, transverse, and descending portions of the colon. There is some stool and gas in the rectosigmoid.  There is no small or large bowel obstructive pattern. No free extraluminal gas collections are observed. The bony structures exhibit no acute abnormalities. There are splenic granulomas present. There may be tiny stones in both kidneys. There is calcification in the wall of the abdominal aorta and common iliac arteries. IMPRESSION: The colonic stool burden is not increased. There is a moderate amount of gas within the ascending and transverse portions of the colon with less gas noted in the descending colon and rectosigmoid. No evidence of obstruction or perforation. Electronically Signed   By: David  Martinique M.D.   On: 10/22/2016 13:17   Dg Chest Port 1 View  Result Date: 10/21/2016 CLINICAL DATA:  Respiratory failure EXAM: PORTABLE CHEST 1 VIEW COMPARISON:  Portable chest x-ray of Oct 15, 2016 FINDINGS: The lungs remain hyperinflated and clear. There is no pneumothorax or pleural effusion. The heart and pulmonary vascularity are normal. There is calcification in the wall of the aortic arch. The ICD is in stable position. The observed bony thorax exhibits no acute abnormality. IMPRESSION: COPD.  No pneumonia, CHF, nor other acute cardiopulmonary disease. Electronically Signed   By: David  Martinique M.D.   On: 10/21/2016 07:08   Dg Chest Port 1 View  Result Date: 10/15/2016 CLINICAL DATA:  Worsening shortness of Breath EXAM: PORTABLE CHEST 1 VIEW COMPARISON:  10/13/2016 FINDINGS: Cardiac shadow is mildly prominent. Defibrillator is again seen and stable. The lungs are hyperinflated consistent with COPD. No focal infiltrate or sizable effusion is noted. No bony abnormality is seen. IMPRESSION: COPD without acute abnormality. Electronically Signed   By: Inez Catalina  M.D.   On: 10/15/2016 09:30     Assessment/Plan  AF (paroxysmal atrial fibrillation) (HCC) On anticoagulation  Hx of AKA (above knee amputation), right (HCC) Well healed  Essential hypertension, benign blood pressure control important in reducing the progression of atherosclerotic disease. On appropriate oral medications.   PVD (peripheral vascular disease) ([place]) His noninvasive studies today demonstrate a patent left SFA and popliteal stent. The common femoral artery, profunda femoris artery origin and superficial femoral artery origin have 50-74% stenosis by duplex criteria. His overall ABI remains 1.3 with a fair digital pressure distally. No intervention is currently required. He is reasonably high risk so we will continue to watch this on 6 month intervals with noninvasive studies at this point. Continue current medical regimen.    Leotis Pain, MD  11/13/2016 3:45 PM    This note was created with Dragon medical transcription system.  Any errors from dictation are purely unintentional

## 2016-11-13 NOTE — Assessment & Plan Note (Signed)
Well-healed

## 2016-12-21 ENCOUNTER — Telehealth: Payer: Self-pay | Admitting: Internal Medicine

## 2016-12-21 MED ORDER — FLUTICASONE-SALMETEROL 115-21 MCG/ACT IN AERO: INHALATION_SPRAY | RESPIRATORY_TRACT | 5 refills | Status: DC

## 2016-12-21 NOTE — Telephone Encounter (Signed)
Pt calling stating he is on Advair he is needing a refill but needs to clarify what dose to take Please advise  He uses [facility] in [place]

## 2016-12-21 NOTE — Telephone Encounter (Signed)
Advair 115 has been sent to pharmacy per patient request.

## 2017-02-01 ENCOUNTER — Encounter: Payer: Self-pay | Admitting: Internal Medicine

## 2017-02-01 ENCOUNTER — Other Ambulatory Visit: Payer: Self-pay

## 2017-02-01 MED ORDER — TIOTROPIUM BROMIDE MONOHYDRATE 1.25 MCG/ACT IN AERS: 2.0000 | INHALATION_SPRAY | Freq: Every day | RESPIRATORY_TRACT | 2 refills | Status: DC

## 2017-02-01 NOTE — Telephone Encounter (Signed)
This encounter was created in error - please disregard.

## 2017-02-01 NOTE — Telephone Encounter (Signed)
Pt needs 90 supply

## 2017-02-02 ENCOUNTER — Other Ambulatory Visit: Payer: Medicare Other

## 2017-02-02 ENCOUNTER — Encounter
Admission: RE | Admit: 2017-02-02 | Discharge: 2017-02-02 | Disposition: A | Discharge status: Home / Self Care | Payer: 59 | Source: Ambulatory Visit | Attending: Cardiology | Admitting: Cardiology

## 2017-02-02 DIAGNOSIS — Z01818 Encounter for other preprocedural examination: Secondary | ICD-10-CM | POA: Diagnosis present

## 2017-02-02 HISTORY — DX: Gastro-esophageal reflux disease without esophagitis: K21.9

## 2017-02-02 HISTORY — DX: Dyspnea, unspecified: R06.00

## 2017-02-02 HISTORY — DX: Major depressive disorder, single episode, unspecified: F32.9

## 2017-02-02 HISTORY — DX: Depression, unspecified: F32.A

## 2017-02-02 LAB — SURGICAL PCR SCREEN
MRSA, PCR: NEGATIVE
STAPHYLOCOCCUS AUREUS: NEGATIVE

## 2017-02-02 NOTE — Patient Instructions (Addendum)
Your procedure is scheduled [id number] 19, 2018 WEDNESDAY Report to Same Day Surgery on the 2nd floor in the [place]. To find out your arrival time, please call [phone] between 1PM - 3PM [id number] 18, 2018 TUESDAY  REMEMBER: Instructions that are not followed completely may result in serious medical risk up to and including death; or upon the discretion of your surgeon and anesthesiologist your surgery may need to be rescheduled.  Do not eat food or drink liquids after midnight. No gum chewing or hard candies.  You may however, drink CLEAR liquids up to 2 hours before you are scheduled to arrive at the hospital for your procedure.  Do not drink clear liquids within 2 hours of your scheduled arrival to the hospital as this may lead to your procedure being delayed or rescheduled.  Clear liquids include: - water  - apple juice without pulp - clear gatorade - black coffee or tea (NO milk, creamers, sugars) DO NOT drink anything not on this list.  Type 1 and Type 2 diabetics should only drink water.  No Alcohol for 24 hours before or after surgery.  No Smoking for 24 hours prior to surgery.  Notify your doctor if there is any change in your medical condition (cold, fever, infection).  Do not wear jewelry, make-up, hairpins, clips or nail polish.  Do not wear lotions, powders, or perfumes.   Do not shave 48 hours prior to surgery. Men may shave face and neck.  Contacts and dentures may not be worn into surgery.  Do not bring valuables to the hospital. [facility] is not responsible for any belongings or valuables.   TAKE THESE MEDICATIONS THE MORNING OF SURGERY WITH A SIP OF WATER: PANTOPRAZOLE AND TAKE A DOSE THE NIGHT BEFORE SURGERY  Use CHG Soap or wipes as directed on instruction sheet.  Use inhalers on the day of surgery and bring to the hospital.  Follow recommendations from Cardiologist, Pulmonologist or PCP regarding stopping Aspirin AND  Coumadin.  Stop Anti-inflammatories such as Advil, Aleve, Ibuprofen, Motrin, Naproxen, Naprosyn, Goodie powder, or aspirin products. (May take Tylenol or Acetaminophen and Celebrex if needed.)  Stop supplements until after surgery. (May continue Vitamin D, Vitamin B, and multivitamin.)  If you are being admitted to the hospital overnight, leave your suitcase in the car. After surgery it may be brought to your room.  If you are being discharged the day of surgery, you will not be allowed to drive home. You will need someone to drive you home and stay with you that night.   If you are taking public transportation, you will need to have a responsible adult to with you.  Please call the number above if you have any questions about these instructions.    Your procedure is scheduled on: Report to Same Day Surgery on the 2nd floor in the [place]. To find out your arrival time, please call [phone] between 1PM - 3PM on:  REMEMBER: Instructions that are not followed completely may result in serious medical risk up to and including death; or upon the discretion of your surgeon and anesthesiologist your surgery may need to be rescheduled.  Do not eat food or drink liquids after midnight. No gum chewing or hard candies.  You may however, drink CLEAR liquids up to 2 hours before you are scheduled to arrive at the hospital for your procedure.  Do not drink clear liquids within 2 hours of your scheduled arrival to the hospital as this may  lead to your procedure being delayed or rescheduled.  Clear liquids include: - water  - apple juice without pulp - clear gatorade - black coffee or tea (NO milk, creamers, sugars) DO NOT drink anything not on this list.  Type 1 and Type 2 diabetics should only drink water.  No Alcohol for 24 hours before or after surgery.  No Smoking for 24 hours prior to surgery.  Notify your doctor if there is any change in your medical condition (cold, fever,  infection).  Do not wear jewelry, make-up, hairpins, clips or nail polish.  Do not wear lotions, powders, or perfumes.   Do not shave 48 hours prior to surgery. Men may shave face and neck.  Contacts and dentures may not be worn into surgery.  Do not bring valuables to the hospital. [facility] is not responsible for any belongings or valuables.   TAKE THESE MEDICATIONS THE MORNING OF SURGERY WITH A SIP OF WATER:    Use CHG Soap or wipes as directed on instruction sheet.  Fleets enema or Magnesium Citrate as directed.  Use inhalers on the day of surgery and bring to the hospital.  Bring your C-PAP to the hospital with you in case you may have to spend the night.  Stop Metformin and Janumet 2 days prior to surgery.  Take 1/2 of usual insulin dose the night before surgery and none on the morning of surgery.  Follow recommendations from Cardiologist, Pulmonologist or PCP regarding stopping Aspirin, Coumadin, Plavix, Eliquis, Pradaxa, or Pletal.  Stop Anti-inflammatories such as Advil, Aleve, Ibuprofen, Motrin, Naproxen, Naprosyn, Goodie powder, or aspirin products. (May take Tylenol or Acetaminophen and Celebrex if needed.)  Stop supplements until after surgery. (May continue Vitamin D, Vitamin B, and multivitamin.)  If you are being admitted to the hospital overnight, leave your suitcase in the car. After surgery it may be brought to your room.  If you are being discharged the day of surgery, you will not be allowed to drive home. You will need someone to drive you home and stay with you that night.   If you are taking public transportation, you will need to have a responsible adult to with you.  Please call the number above if you have any questions about these instructions.

## 2017-02-08 ENCOUNTER — Encounter: Payer: Self-pay | Admitting: Internal Medicine

## 2017-02-08 ENCOUNTER — Ambulatory Visit (INDEPENDENT_AMBULATORY_CARE_PROVIDER_SITE_OTHER): Payer: 59 | Admitting: Internal Medicine

## 2017-02-08 VITALS — BP 136/80 | HR 76 | Ht 73.0 in | Wt 148.0 lb

## 2017-02-08 DIAGNOSIS — J449 Chronic obstructive pulmonary disease, unspecified: Secondary | ICD-10-CM

## 2017-02-08 DIAGNOSIS — R911 Solitary pulmonary nodule: Secondary | ICD-10-CM

## 2017-02-08 NOTE — Patient Instructions (Addendum)
Follow up in 6 months with CT chest Continue oxygen as prescribed Continue inhalers as prescribed

## 2017-02-08 NOTE — Addendum Note (Signed)
Addended by: Flora Lipps on: 02/08/2017 02:38 PM   Modules accepted: Orders

## 2017-02-08 NOTE — Progress Notes (Signed)
Name: Tyler [name] MRN:   [id number] DOB:   1955/09/23             CONSULTATION DATE:  02/08/2017   REFERRING MD :  Dr. Bridgett Larsson   CHIEF COMPLAINT: Shortness of Breath/COPD  BRIEF PATIENT DESCRIPTION:  61 yo old male admitted with 05/22 with acute on chronic hypercapnic hypoxic respiratory failure secondary to AECOPD   STUDIES:  CXR 10/21/16 I have Independently reviewed images of  cxr   on 02/08/2017 Interpretation: No evidence of opacifications no effusions   PATIENT PROFILE This is a 61 yo male with a PMH of Cardiac Pacemaker, PVD, HTN, Myocardial Infarction, CAD, COPD, Chronic Systolic CHF, Atrial Fibrillation, and AICD.    He presented to [facility] ER 05/22 with c/o worsening shortness of breath with chest COPD exacerbation Hosp course-Steroids, ABX and BD therapy patient discharged home with oxygen  Patient saw Dr. 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  Mother died of lung cancer Father died of kidney cancer  CURRENT OV Patient remained short of breath with dyspnea on exertion No signs of infection at this time  No signs of CHF at this time  Patient currently on Advair 115 twice daily Patient also is on Spiriva 1.25 Patient is currently on oxygen therapy 24/7 Patient states that oxygen therapy helps his resp status tremendously                                                                                                                         REVIEW OF SYSTEMS:  Positives in BOLD  Constitutional:  Negative for fever, chills, weight loss, malaise/fatigue and diaphoresis.  HENT: Negative for hearing loss, ear pain, nosebleeds, congestion, sore throat, neck pain, tinnitus and ear discharge.   Eyes: Negative for blurred vision, double vision, photophobia, pain, discharge and redness.  Respiratory: + cough, hemoptysis, -sputum production, + shortness of breath, + chest congestion,  - wheezing and stridor.   Cardiovascular: Negative for chest pain, palpitations, orthopnea, claudication, leg swelling and PND.  Gastrointestinal: Negative for heartburn, nausea, vomiting, abdominal pain, diarrhea, constipation, blood in stool and melena.  Genitourinary: Negative for dysuria, urgency, frequency, hematuria and flank pain.    BP 136/80 (BP Location: Left Arm, Cuff Size: Normal)   Pulse 76   Ht 6\' 1"  (1.854 m)   Wt 148 lb (67.1 kg)   SpO2 92%   BMI 19.53 kg/m   PHYSICAL EXAMINATION: General: , NAD Neuro: alert and oriented, follows commands HEENT: supple, no JVD Cardiovascular: irregular irregular, no M/R/G Lungs:  diminished throughout, even, non labored CTA B/L Abdomen: +BS x4, soft, non tender, non distended Musculoskeletal: right aka, normal tone Skin: intact no rashes or lesions   ASSESSMENT / PLAN: 61 year old pleasant white male with multiple medical problems including CHF, very severe COPD Acute on chronic hypoxic hypercapnic respiratory failure secondary to COPD with Chronic Atrial Fibrillation And h/o Pacemaker, HTN, CAD, AICD, and MI with chronic hypoxic respiratory failure  #1 shortness of breath and dyspnea on exertion Related to multifactorial issues with underlying chronic CHF underlying severe COPD in the setting of deconditioned state  #2 COPD severe Gold stage D Continue inhalers as prescribed patient is currently on Advair and Spiriva Continue Spiriva as prescribed 1.25 dose Continue Advair as prescribed 115 dose No indication for antibiotics at this time No  indication for systemic oral prednisone at this time  #3 allergic rhinitis Continue antihistamine with Claritin as prescribed   #4 chronic hypoxic restaurant failure I have advised patient to use oxygen when needed especially during exertion and also at night    #5 Extensive smoking history with severe COPD CT scan reports show bilateral subcentimeter nodules the largest solid nodule was noted to be 6 mm in average dimension Recommended follow-up CT scan in 6 months  #6 atrial fibrillation Exline continue Coumadin therapy as prescribed  #7 systolic CHF Follow up with cardiology as scheduled Continue Lasix as prescribed  #8 deconditioned state Increase exercise capacity is tolerated this will be difficult to do in the setting of right AKA   Patient/Family are satisfied with Plan of action and management. All questions answered Follow-up in 6 months with interval changes and CT chest   Brittannie Tawney Patricia Pesa, M.D.  [facility]  Medical Director [place] Director [facility] Cardio-Pulmonary Department

## 2017-02-10 ENCOUNTER — Ambulatory Visit: Payer: 59 | Admitting: Anesthesiology

## 2017-02-10 ENCOUNTER — Encounter
Admission: RE | Disposition: A | Discharge status: Home / Self Care | Payer: Self-pay | Source: Ambulatory Visit | Attending: Cardiology

## 2017-02-10 ENCOUNTER — Encounter: Payer: Self-pay | Admitting: Anesthesiology

## 2017-02-10 ENCOUNTER — Ambulatory Visit
Admission: RE | Admit: 2017-02-10 | Discharge: 2017-02-10 | Disposition: A | Discharge status: Home / Self Care | Payer: 59 | Source: Ambulatory Visit | Attending: Cardiology | Admitting: Cardiology

## 2017-02-10 DIAGNOSIS — Z87891 Personal history of nicotine dependence: Secondary | ICD-10-CM | POA: Diagnosis not present

## 2017-02-10 DIAGNOSIS — K219 Gastro-esophageal reflux disease without esophagitis: Secondary | ICD-10-CM | POA: Diagnosis not present

## 2017-02-10 DIAGNOSIS — I252 Old myocardial infarction: Secondary | ICD-10-CM | POA: Diagnosis not present

## 2017-02-10 DIAGNOSIS — I4891 Unspecified atrial fibrillation: Secondary | ICD-10-CM | POA: Insufficient documentation

## 2017-02-10 DIAGNOSIS — Z9841 Cataract extraction status, right eye: Secondary | ICD-10-CM | POA: Diagnosis not present

## 2017-02-10 DIAGNOSIS — J449 Chronic obstructive pulmonary disease, unspecified: Secondary | ICD-10-CM | POA: Diagnosis not present

## 2017-02-10 DIAGNOSIS — Z9842 Cataract extraction status, left eye: Secondary | ICD-10-CM | POA: Diagnosis not present

## 2017-02-10 DIAGNOSIS — Z8249 Family history of ischemic heart disease and other diseases of the circulatory system: Secondary | ICD-10-CM | POA: Insufficient documentation

## 2017-02-10 DIAGNOSIS — Z955 Presence of coronary angioplasty implant and graft: Secondary | ICD-10-CM | POA: Insufficient documentation

## 2017-02-10 DIAGNOSIS — I2510 Atherosclerotic heart disease of native coronary artery without angina pectoris: Secondary | ICD-10-CM | POA: Diagnosis not present

## 2017-02-10 DIAGNOSIS — Z9889 Other specified postprocedural states: Secondary | ICD-10-CM | POA: Insufficient documentation

## 2017-02-10 DIAGNOSIS — Z89611 Acquired absence of right leg above knee: Secondary | ICD-10-CM | POA: Insufficient documentation

## 2017-02-10 DIAGNOSIS — I255 Ischemic cardiomyopathy: Secondary | ICD-10-CM | POA: Insufficient documentation

## 2017-02-10 DIAGNOSIS — D6851 Activated protein C resistance: Secondary | ICD-10-CM | POA: Insufficient documentation

## 2017-02-10 DIAGNOSIS — I5022 Chronic systolic (congestive) heart failure: Secondary | ICD-10-CM | POA: Insufficient documentation

## 2017-02-10 DIAGNOSIS — I110 Hypertensive heart disease with heart failure: Secondary | ICD-10-CM | POA: Insufficient documentation

## 2017-02-10 DIAGNOSIS — Z86010 Personal history of colonic polyps: Secondary | ICD-10-CM | POA: Diagnosis not present

## 2017-02-10 DIAGNOSIS — I739 Peripheral vascular disease, unspecified: Secondary | ICD-10-CM | POA: Diagnosis not present

## 2017-02-10 DIAGNOSIS — Z7901 Long term (current) use of anticoagulants: Secondary | ICD-10-CM | POA: Insufficient documentation

## 2017-02-10 DIAGNOSIS — Z961 Presence of intraocular lens: Secondary | ICD-10-CM | POA: Diagnosis not present

## 2017-02-10 DIAGNOSIS — Z95810 Presence of automatic (implantable) cardiac defibrillator: Secondary | ICD-10-CM | POA: Insufficient documentation

## 2017-02-10 DIAGNOSIS — Z8371 Family history of colonic polyps: Secondary | ICD-10-CM | POA: Diagnosis not present

## 2017-02-10 DIAGNOSIS — Z79899 Other long term (current) drug therapy: Secondary | ICD-10-CM | POA: Insufficient documentation

## 2017-02-10 DIAGNOSIS — Z7951 Long term (current) use of inhaled steroids: Secondary | ICD-10-CM | POA: Insufficient documentation

## 2017-02-10 DIAGNOSIS — Z7982 Long term (current) use of aspirin: Secondary | ICD-10-CM | POA: Insufficient documentation

## 2017-02-10 DIAGNOSIS — Z45010 Encounter for checking and testing of cardiac pacemaker pulse generator [battery]: Secondary | ICD-10-CM | POA: Insufficient documentation

## 2017-02-10 HISTORY — PX: IMPLANTABLE CARDIOVERTER DEFIBRILLATOR (ICD) GENERATOR CHANGE: SHX5469

## 2017-02-10 LAB — PROTIME-INR
INR: 1.09
Prothrombin Time: 14 seconds (ref 11.4–15.2)

## 2017-02-10 SURGERY — ICD GENERATOR CHANGE
Anesthesia: Monitor Anesthesia Care | Site: Shoulder | Laterality: Left | Wound class: Clean

## 2017-02-10 MED ORDER — PROPOFOL 10 MG/ML IV BOLUS
INTRAVENOUS | Status: DC | PRN
  Administered 2017-02-10: 50 mg via INTRAVENOUS
  Administered 2017-02-10: 30 mg via INTRAVENOUS
  Administered 2017-02-10: 10 mg via INTRAVENOUS

## 2017-02-10 MED ORDER — SODIUM CHLORIDE 0.9 % IR SOLN
Freq: Once | Status: DC
  Filled 2017-02-10: qty 2

## 2017-02-10 MED ORDER — FENTANYL CITRATE (PF) 100 MCG/2ML IJ SOLN: 25.0000 ug | INTRAMUSCULAR | Status: DC | PRN

## 2017-02-10 MED ORDER — METOPROLOL SUCCINATE ER 25 MG PO TB24
25.0000 mg | ORAL_TABLET | Freq: Every day | ORAL | Status: DC
  Administered 2017-02-10: 25 mg via ORAL
  Filled 2017-02-10 (×2): qty 1

## 2017-02-10 MED ORDER — ONDANSETRON HCL 4 MG/2ML IJ SOLN: 4.0000 mg | Freq: Once | INTRAMUSCULAR | Status: DC | PRN

## 2017-02-10 MED ORDER — PHENYLEPHRINE HCL 10 MG/ML IJ SOLN
INTRAMUSCULAR | Status: DC | PRN
  Administered 2017-02-10 (×2): 100 ug via INTRAVENOUS

## 2017-02-10 MED ORDER — CEFAZOLIN SODIUM-DEXTROSE 1-4 GM/50ML-% IV SOLN
INTRAVENOUS | Status: AC
  Filled 2017-02-10: qty 50

## 2017-02-10 MED ORDER — FENTANYL CITRATE (PF) 100 MCG/2ML IJ SOLN
INTRAMUSCULAR | Status: AC
  Filled 2017-02-10: qty 2

## 2017-02-10 MED ORDER — LIDOCAINE 1 % OPTIME INJ - NO CHARGE
INTRAMUSCULAR | Status: DC | PRN
  Administered 2017-02-10: 25 mL

## 2017-02-10 MED ORDER — PROPOFOL 10 MG/ML IV BOLUS
INTRAVENOUS | Status: AC
Start: 2017-02-10 — End: ?
  Filled 2017-02-10: qty 40

## 2017-02-10 MED ORDER — MIDAZOLAM HCL 2 MG/2ML IJ SOLN
INTRAMUSCULAR | Status: AC
Start: 2017-02-10 — End: ?
  Filled 2017-02-10: qty 2

## 2017-02-10 MED ORDER — LACTATED RINGERS IV SOLN
INTRAVENOUS | Status: DC
  Administered 2017-02-10: 13:00:00 via INTRAVENOUS

## 2017-02-10 MED ORDER — PROPOFOL 500 MG/50ML IV EMUL
INTRAVENOUS | Status: DC | PRN
  Administered 2017-02-10: 55 ug/kg/min via INTRAVENOUS

## 2017-02-10 MED ORDER — LIDOCAINE HCL (PF) 2 % IJ SOLN
INTRAMUSCULAR | Status: AC
Start: 2017-02-10 — End: ?
  Filled 2017-02-10: qty 2

## 2017-02-10 MED ORDER — CEFAZOLIN SODIUM-DEXTROSE 1-4 GM/50ML-% IV SOLN
1.0000 g | Freq: Once | INTRAVENOUS | Status: AC
  Administered 2017-02-10: 1 g via INTRAVENOUS

## 2017-02-10 MED ORDER — MIDAZOLAM HCL 2 MG/2ML IJ SOLN
INTRAMUSCULAR | Status: DC | PRN
  Administered 2017-02-10: 2 mg via INTRAVENOUS

## 2017-02-10 MED ORDER — CEPHALEXIN 250 MG PO CAPS: 250.0000 mg | ORAL_CAPSULE | Freq: Four times a day (QID) | ORAL | 0 refills | Status: DC

## 2017-02-10 MED ORDER — LIDOCAINE HCL (CARDIAC) 20 MG/ML IV SOLN
INTRAVENOUS | Status: DC | PRN
  Administered 2017-02-10: 40 mg via INTRAVENOUS

## 2017-02-10 SURGICAL SUPPLY — 47 items
BAG DECANTER FOR FLEXI CONT (MISCELLANEOUS) ×3 IMPLANT
BLADE PHOTON ILLUMINATED (MISCELLANEOUS) ×3 IMPLANT
BLADE SURG SZ10 CARB STEEL (BLADE) ×3 IMPLANT
CANISTER SUCT 1200ML W/VALVE (MISCELLANEOUS) ×3 IMPLANT
CHLORAPREP W/TINT 26ML (MISCELLANEOUS) ×3 IMPLANT
CLOSURE WOUND 1/2 X4 (GAUZE/BANDAGES/DRESSINGS) ×1
COVER LIGHT HANDLE STERIS (MISCELLANEOUS) ×6 IMPLANT
DRAPE C-ARM XRAY 36X54 (DRAPES) IMPLANT
DRAPE INCISE IOBAN 66X45 STRL (DRAPES) ×3 IMPLANT
DRAPE LAPAROTOMY 77X122 PED (DRAPES) ×3 IMPLANT
DRSG TEGADERM 4X4.75 (GAUZE/BANDAGES/DRESSINGS) ×3 IMPLANT
DRSG TEGADERM 6X8 (GAUZE/BANDAGES/DRESSINGS) ×3 IMPLANT
ELECT REM PT RETURN 9FT ADLT (ELECTROSURGICAL) ×3
ELECTRODE REM PT RTRN 9FT ADLT (ELECTROSURGICAL) ×1 IMPLANT
GLOVE BIO SURGEON STRL SZ 6.5 (GLOVE) ×2 IMPLANT
GLOVE BIO SURGEON STRL SZ8 (GLOVE) ×3 IMPLANT
GLOVE BIO SURGEONS STRL SZ 6.5 (GLOVE) ×1
GLOVE INDICATOR 7.0 STRL GRN (GLOVE) ×3 IMPLANT
GOWN STRL REUS W/ TWL LRG LVL3 (GOWN DISPOSABLE) ×2 IMPLANT
GOWN STRL REUS W/ TWL XL LVL3 (GOWN DISPOSABLE) ×1 IMPLANT
GOWN STRL REUS W/TWL LRG LVL3 (GOWN DISPOSABLE) ×4
GOWN STRL REUS W/TWL XL LVL3 (GOWN DISPOSABLE) ×2
GRADUATE 1200CC STRL 31836 (MISCELLANEOUS) ×3 IMPLANT
ICD CLARIA MRI DTMA1D1 (ICD Generator) ×3 IMPLANT
IV NS 1000ML (IV SOLUTION)
IV NS 1000ML BAXH (IV SOLUTION) IMPLANT
KIT RM TURNOVER STRD PROC AR (KITS) ×3 IMPLANT
NEEDLE FILTER BLUNT 18X 1/2SAF (NEEDLE) ×2
NEEDLE FILTER BLUNT 18X1 1/2 (NEEDLE) ×1 IMPLANT
NEEDLE HYPO 25X1 1.5 SAFETY (NEEDLE) ×3 IMPLANT
NEEDLE SPNL 22GX3.5 QUINCKE BK (NEEDLE) ×3 IMPLANT
NS IRRIG 500ML POUR BTL (IV SOLUTION) ×3 IMPLANT
PACK BASIN MINOR ARMC (MISCELLANEOUS) ×3 IMPLANT
PACK PACE INSERTION (MISCELLANEOUS) ×3 IMPLANT
PAD STATPAD (MISCELLANEOUS) ×3 IMPLANT
PIN PLUG IS-1 DEFIB (PIN) ×3 IMPLANT
STRAP SAFETY BODY (MISCELLANEOUS) ×3 IMPLANT
STRIP CLOSURE SKIN 1/2X4 (GAUZE/BANDAGES/DRESSINGS) ×2 IMPLANT
SUT SILK 2 0 SH (SUTURE) IMPLANT
SUT VIC AB 2-0 CT1 27 (SUTURE) ×2
SUT VIC AB 2-0 CT1 TAPERPNT 27 (SUTURE) ×1 IMPLANT
SUT VIC AB 2-0 CT2 27 (SUTURE) ×3 IMPLANT
SUT VIC AB 3-0 PS2 18 (SUTURE) IMPLANT
SUT VIC AB 4-0 PS2 18 (SUTURE) ×3 IMPLANT
SYR BULB IRRIG 60ML STRL (SYRINGE) ×3 IMPLANT
SYR CONTROL 10ML (SYRINGE) ×3 IMPLANT
SYRINGE 10CC LL (SYRINGE) ×3 IMPLANT

## 2017-02-10 NOTE — Anesthesia Post-op Follow-up Note (Signed)
Anesthesia QCDR form completed.        

## 2017-02-10 NOTE — H&P (Signed)
[facility] Cardiology History and Physical  Patient ID: Tyler [name] MRN: [id number] DOB/AGE: 61/61/1957 61 y.o. Admit date: 02/10/2017  Primary Care Physician: Madelyn Brunner, MD Primary Cardiologist  Nehemiah Massed  HPI: 61 year old gentleman referred for ICD generator change out. The patient has known ischemic heart disease, status post coronary stent, with chronic systolic congestive heart failure. Recent interrogation revealed elective replacement indication.    Past Medical History:  Diagnosis Date  . AICD (automatic cardioverter/defibrillator) present   . Atrial fibrillation ([place])   . CHF (congestive heart failure) ([place])   . COPD (chronic obstructive pulmonary disease) ([place])   . Coronary artery disease   . Depression   . Dyspnea   . GERD (gastroesophageal reflux disease)   . Hypertension   . Myocardial infarction ([place])   . Peripheral vascular disease ([place])   . Presence of permanent cardiac pacemaker     Past Surgical History:  Procedure Laterality Date  . ABOVE KNEE LEG AMPUTATION Right    after below the knee amputation   . BELOW KNEE LEG AMPUTATION Right   . CATARACT EXTRACTION, BILATERAL Bilateral   . INSERT / REPLACE / REMOVE PACEMAKER    . PERIPHERAL VASCULAR CATHETERIZATION Left 12/09/2015   Procedure: Lower Extremity Angiography;  Surgeon: Algernon Huxley, MD;  Location: [place] [facility];  Service: Cardiovascular;  Laterality: Left;  . PERIPHERAL VASCULAR CATHETERIZATION  12/09/2015   Procedure: Lower Extremity Intervention;  Surgeon: Algernon Huxley, MD;  Location: [place] [facility];  Service: Cardiovascular;;  . TONSILLECTOMY      Prescriptions Prior to Admission  Medication Sig Dispense Refill Last Dose  . acetaminophen (TYLENOL) 500 MG tablet Take 1,000 mg by mouth daily as needed for moderate pain or headache.   02/09/2017 at Unknown time  . ALPRAZolam (XANAX) 1 MG tablet Take 1 mg by mouth at bedtime.   02/09/2017 at Unknown time  . aspirin EC 81 MG tablet Take  1 tablet (81 mg total) by mouth daily. 150 tablet 2 02/05/2017  . docusate sodium (COLACE) 100 MG capsule Take 100 mg by mouth every other day.   02/08/2017 at Unknown time  . fluticasone (FLONASE) 50 MCG/ACT nasal spray Place 2 sprays into both nostrils daily.   02/09/2017 at Unknown time  . fluticasone-salmeterol (ADVAIR HFA) 115-21 MCG/ACT inhaler USE 2 INHALATIONS ORALLY   EVERY 12 HOURS 12 g 5 02/10/2017 at Unknown time  . furosemide (LASIX) 20 MG tablet Take 10 mg by mouth every other day.   02/09/2017 at Unknown time  . ipratropium (ATROVENT) 0.03 % nasal spray Place 2 sprays into both nostrils every 12 (twelve) hours.   02/10/2017 at Unknown time  . Ipratropium-Albuterol (COMBIVENT RESPIMAT) 20-100 MCG/ACT AERS respimat Inhale 1 puff into the lungs every 4 (four) hours as needed for wheezing.   02/10/2017 at Unknown time  . levalbuterol (XOPENEX) 1.25 MG/3ML nebulizer solution Take 1.25 mg (3 mLs total) by nebulization every 4 (four) hours. (Patient taking differently: Take 3 mLs by nebulization every 4 (four) hours as needed for wheezing or shortness of breath. ) 72 mL 12 Taking  . loratadine (CLARITIN) 10 MG tablet Take 10 mg by mouth at bedtime.   02/09/2017 at Unknown time  . lovastatin (MEVACOR) 20 MG tablet Take 20 mg by mouth at bedtime.   02/09/2017 at Unknown time  . metoprolol succinate (TOPROL-XL) 25 MG 24 hr tablet Take 25 mg by mouth daily.   02/10/2017 at Unknown time  . nystatin ointment (MYCOSTATIN)  Apply 1 application topically daily as needed for rash.  0 Taking  . pantoprazole (PROTONIX) 40 MG tablet Take 40 mg by mouth daily.   02/10/2017 at Unknown time  . ramipril (ALTACE) 10 MG capsule Take 10 mg by mouth at bedtime.    02/09/2017 at Unknown time  . simethicone (MYLICON) 80 MG chewable tablet Chew 1 tablet (80 mg total) by mouth 4 (four) times daily. (Patient taking differently: Chew 80 mg by mouth 2 (two) times daily as needed for flatulence. ) 30 tablet 0 Taking  . Tiotropium  Bromide Monohydrate (SPIRIVA RESPIMAT) 1.25 MCG/ACT AERS Inhale 2 puffs into the lungs daily. 12 g 2 02/10/2017 at Unknown time  . warfarin (COUMADIN) 5 MG tablet Take 5 mg by mouth every evening.    02/05/2017  . polyethylene glycol (MIRALAX / GLYCOLAX) packet Take 17 g by mouth daily. 14 each 0 Taking   Social History   Social History  . Marital status: Married    Spouse name: N/A  . Number of children: N/A  . Years of education: N/A   Occupational History  . Not on file.   Social History Main Topics  . Smoking status: Former Smoker    Packs/day: 1.00    Years: 42.00    Types: Cigarettes    Quit date: 09/23/2013  . Smokeless tobacco: Never Used  . Alcohol use No  . Drug use: No  . Sexual activity: Not on file   Other Topics Concern  . Not on file   Social History Narrative  . No narrative on file    Family History  Problem Relation Age of Onset  . Cancer Mother   . Cancer Father   . Heart disease Father       Review of systems complete and found to be negative unless listed above      Physical Exam:  General: Well developed, well nourished, in no acute distress HEENT:  Normocephalic and atramatic Neck:  No JVD.  Lungs: Clear bilaterally to auscultation and percussion. Heart: HRRR . Normal S1 and S2 without gallops or murmurs.  Abdomen: Bowel sounds are positive, abdomen soft and non-tender  Msk:  Back normal, normal gait. Normal strength and tone for age. Extremities: No clubbing, cyanosis or edema.   Neuro: Alert and oriented X 3. Psych:  Good affect, responds appropriately   Labs:   Lab Results  Component Value Date   WBC 18.0 (H) 10/23/2016   HGB 14.7 10/23/2016   HCT 42.9 10/23/2016   MCV 86.9 10/23/2016   PLT 356 10/23/2016   No results for input(s): NA, K, CL, CO2, BUN, CREATININE, CALCIUM, PROT, BILITOT, ALKPHOS, ALT, AST, GLUCOSE in the last 168 hours.  Invalid input(s): LABALBU Lab Results  Component Value Date   CKTOTAL 144 06/14/2011    CKMB 2.4 06/14/2011   TROPONINI <0.03 10/22/2016   No results found for: CHOL No results found for: HDL No results found for: LDLCALC No results found for: TRIG No results found for: CHOLHDL No results found for: LDLDIRECT    Radiology: No results found.  EKG:   ASSESSMENT AND PLAN:   1. Status post ICD, at elective replacement indication 2. Chronic systolic congestive heart failure 3. Ischemic cardiomyopathy  Recommendations  1. ICD generator change out. The risks, benefits alternatives were explained to the patient and informed written consent was obtained.  Signed: Isaias Cowman MD,PhD, [facility] 02/10/2017, 12:27 PM

## 2017-02-10 NOTE — Discharge Instructions (Signed)
AMBULATORY SURGERY  DISCHARGE INSTRUCTIONS   1) The drugs that you were given will stay in your system until tomorrow so for the next 24 hours you should not:  A) Drive an automobile B) Make any legal decisions C) Drink any alcoholic beverage   2) You may resume regular meals tomorrow.  Today it is better to start with liquids and gradually work up to solid foods.  You may eat anything you prefer, but it is better to start with liquids, then soup and crackers, and gradually work up to solid foods.   3) Please notify your doctor immediately if you have any unusual bleeding, trouble breathing, redness and pain at the surgery site, drainage, fever, or pain not relieved by medication.    4) Additional Instructions:        Please contact your physician with any problems or Same Day Surgery at [phone], Monday through Friday 6 am to 4 pm, or [place] [facility] number at [phone].Remove outer bandage on 02/11/2017, leave Steri-Strips on. May shower 02/11/2017.

## 2017-02-10 NOTE — Transfer of Care (Signed)
Immediate Anesthesia Transfer of Care Note  Patient: Epifania Gore Marcou  Procedure(s) Performed: Procedure(s): ICD GENERATOR CHANGE (Left)  Patient Location: PACU  Anesthesia Type:General  Level of Consciousness: awake and alert   Airway & Oxygen Therapy: Patient Spontanous Breathing and Patient connected to nasal cannula oxygen  Post-op Assessment: Report given to RN and Post -op Vital signs reviewed and stable  Post vital signs: Reviewed and stable  Last Vitals:  Vitals:   02/10/17 1216 02/10/17 1448  BP: (!) 114/99 119/88  Pulse: 71 83  Resp:  19  Temp: 36.7 C   SpO2: 100%     Last Pain:  Vitals:   02/10/17 1216  TempSrc: Oral         Complications: No apparent anesthesia complications

## 2017-02-10 NOTE — Anesthesia Postprocedure Evaluation (Signed)
Anesthesia Post Note  Patient: Tyler [name]  Procedure(s) Performed: Procedure(s) (LRB): ICD GENERATOR CHANGE (Left)  Patient location during evaluation: PACU Anesthesia Type: MAC Level of consciousness: awake and alert Pain management: pain level controlled Vital Signs Assessment: post-procedure vital signs reviewed and stable Respiratory status: spontaneous breathing, nonlabored ventilation, respiratory function stable and patient connected to nasal cannula oxygen Cardiovascular status: blood pressure returned to baseline and stable Postop Assessment: no apparent nausea or vomiting Anesthetic complications: no     Last Vitals:  Vitals:   02/10/17 1505 02/10/17 1520  BP: 109/75 115/77  Pulse: 74 76  Resp: (!) 22 16  Temp:  (!) 36.3 C  SpO2: 100% 100%    Last Pain:  Vitals:   02/10/17 1216  TempSrc: Oral                 Renji Berwick S

## 2017-02-10 NOTE — Op Note (Signed)
[facility] Cardiology   02/10/2017                     2:50 PM  PATIENT:  Tyler [name]    PRE-OPERATIVE DIAGNOSIS:  ERI  POST-OPERATIVE DIAGNOSIS:  Same  PROCEDURE:  ICD GENERATOR CHANGE  SURGEON:  Isaias Cowman, MD    ANESTHESIA:     PREOPERATIVE INDICATIONS:  Tyler [name] is a  61 y.o. male with a diagnosis of ERI who failed conservative measures and elected for surgical management.    The risks benefits and alternatives were discussed with the patient preoperatively including but not limited to the risks of infection, bleeding, cardiopulmonary complications, the need for revision surgery, among others, and the patient was willing to proceed.   OPERATIVE PROCEDURE: The patient was brought to the operating room the fasting state. The left pectoral region was prepped and draped in usual sterile manner. A 6 cm incision was performed over the left pectoral region. The old ICD generator was retrieved by electrocautery and blunt dissection. The leads were disconnected and connected to a new BiV ICD generator, Medtronic [id number]. The pocket was irrigated with gentamicin solution. The new ICD generator was positioned into the pocket and the pocket was closed with 2-0 and 4-0 Vicryl, respectively. Steri-Strips and a pressure dressing were applied.   ICD Criteria  Current LVEF:30%. Within 12 months prior to implant: No   Heart failure history: Yes, Class III  Cardiomyopathy history: Yes, Ischemic Cardiomyopathy - Prior MI.  Atrial Fibrillation/Atrial Flutter: Yes, Persistent (> 7 Days).  Ventricular tachycardia history: No.  Cardiac arrest history: No.  History of syndromes with risk of sudden death: No.  Previous ICD: Yes, Reason for ICD:  Primary prevention.  Current ICD indication: Secondary  PPM indication: Yes. Pacing type: Ventricular. Greater than 40% RV pacing requirement  anticipated. Indication: Sick Sinus Syndrome   Class I or II Bradycardia indication present: Yes  Beta Blocker therapy for 3 or more months: Yes, prescribed.   Ace Inhibitor/ARB therapy for 3 or more months: No, patient reason.

## 2017-02-10 NOTE — OR Nursing (Signed)
Crutches and oxygen to locker. Partial denture with wife.

## 2017-02-10 NOTE — Anesthesia Preprocedure Evaluation (Addendum)
Anesthesia Evaluation  Patient identified by MRN, date of birth, ID band Patient awake    Reviewed: Allergy & Precautions, NPO status , Patient's Chart, lab work & pertinent test results, reviewed documented beta blocker date and time   Airway Mallampati: II  TM Distance: >3 FB     Dental  (+) Chipped, Partial Lower, Missing   Pulmonary shortness of breath, COPD, former smoker,           Cardiovascular hypertension, Pt. on medications and Pt. on home beta blockers + CAD, + Past MI, + Cardiac Stents, + Peripheral Vascular Disease and +CHF  + pacemaker + Cardiac Defibrillator      Neuro/Psych PSYCHIATRIC DISORDERS Depression    GI/Hepatic GERD  Controlled,  Endo/Other    Renal/GU      Musculoskeletal   Abdominal   Peds  Hematology   Anesthesia Other Findings BKA. Hx CHF.  Reproductive/Obstetrics                            Anesthesia Physical Anesthesia Plan  ASA: III  Anesthesia Plan: MAC   Post-op Pain Management:    Induction:   PONV Risk Score and Plan:   Airway Management Planned:   Additional Equipment:   Intra-op Plan:   Post-operative Plan:   Informed Consent: I have reviewed the patients History and Physical, chart, labs and discussed the procedure including the risks, benefits and alternatives for the proposed anesthesia with the patient or authorized representative who has indicated his/her understanding and acceptance.     Plan Discussed with: CRNA  Anesthesia Plan Comments:         Anesthesia Quick Evaluation

## 2017-02-10 NOTE — H&P (Signed)
<6>29299-5<7>Encounter Details<6>46240-8<7>Social History<6>29762-2<7>Last Filed Vital Signs<6>8716-3<7>Progress Notes<6>10164-2<7>Plan of Treatment<6>18776-5<7>Visit Diagnoses<6>51848-0<7>/"> Jump to Section ? Document InformationEncounter DetailsLast Filed Vital SignsPatient DemographicsPlan of TreatmentProgress NotesReason for VisitSocial HistoryVisit Diagnoses Tyler [name] [employer], generated on Sep. 11, 2018 Printout Information  Document Contents Office Visit Document Received Date Sep. 11, 2018 Tyler [name]   Patient Demographics - 61 y.o. Male, born November 15, 1955   Patient Address Communication Language Race / Ethnicity  [address] [place], [place] [address] [phone]) [phone] (Mobile) [email or web address] English (Preferred) White / Not Hispanic or Latino  Reason for Visit    Reason Comments  Follow-up low BP,   Pacer-ICD Icd, End of life    Encounter Details    Date Type Department Care Team Description  01/07/2017 Office Visit [facility]  [place], [place] [address]  [phone]  Flossie Dibble, MD  [address]  [facility]  [place], [place] [address]  [phone]  [phone] (Fax)  Coronary artery disease involving native coronary artery of native heart without angina pectoris (Primary Dx);  Chronic systolic CHF (congestive heart failure), NYHA class 3 (CMS-HCC);  AICD (automatic cardioverter/defibrillator) present   Social History - as of this encounter   Tobacco Use Types Packs/Day Years Used Date  Former Smoker Cigarettes 1 60 Quit: 11/13/2013  Smokeless Tobacco: Never Used      Alcohol Use Drinks/Week oz/Week Comments  No 12 Cans of beer  7.2  quit 2 1/2 years   Sex Assigned at Birth Date Recorded  Not on file    Last Filed Vital Signs - in this encounter   Vital Sign  Reading Time Taken  Blood Pressure 120/66 01/07/2017 2:20 PM EDT  Pulse 76 01/07/2017 2:20 PM EDT  Temperature - -  Respiratory Rate - -  Oxygen Saturation 99% 01/07/2017 2:20 PM EDT  Inhaled Oxygen Concentration - -  Weight 67.6 kg (149 lb) 01/07/2017 2:20 PM EDT  Height 185.4 cm (6\' 1" ) 01/07/2017 2:20 PM EDT  Body Mass Index 19.66 01/07/2017 2:20 PM EDT   Progress Notes - in this encounter   Flossie Dibble, MD - 01/07/2017 2:30 PM EDT Formatting of this note may be different from the original. Established Patient Visit   Chief Complaint: Chief Complaint  Patient presents with  . Follow-up  low BP,  . Pacer-ICD  Icd, End of life  Date of Service: 01/07/2017 Date of Birth: 1955-10-17 PCP: Lottie Mussel III, MD  History of Present Illness: Tyler [name] is a 61 y.o.male patient  Coronary Artery Disease The patient has coronary artery disease previously diagnosed by imaging study years ago currently on appropriate medication management including ace inhibitor and b-blocker for risk factor reduction. Current risk factors for progression include age, male and history of heart disease. The patient appears not to have any significant side effects of treatment at this time and appears stable at this time without evidence of clinical progression of the disease process  Congestive heart failure The patient has chronic moderate systolic heart failure secondary to coronary artery disease (CAD). Congestive Heart Failure has been manifested by dyspnea and fatigue with a [place] Functional Class of III. The Patient appears euvolemic. and is on ACE inhibitors and beta-blocker with diuretics stable over the last 2 years  Defibrillator Interrogation The patient has had the placement of a defibrillator for significant LV systolic dysfunction. Recent interrogation has revealed that the battery voltage is low indicating the  need for change out for a new battery. The patient understands the risks  and benefits of the defibrillator battery change.  Past Medical and Surgical History  Past Medical History Past Medical History:  Diagnosis Date  . Acute myocardial infarction of anterolateral wall, initial episode of care (CMS-HCC)  2004 cardiac MRI showed MI cardiac cath stent placed  . Atrial fibrillation (CMS-HCC) 06/12/2011  . Bleeding disorder (CMS-HCC)  . Chronic systolic CHF (congestive heart failure), NYHA class 3 (CMS-HCC) 08/01/2014  was told he has class III CHF  . Clotting disorder (CMS-HCC)  on coumadin  . Colon polyps  . 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 Coronary artery disease 06/12/2011  . Factor V Leiden mutation (CMS-HCC) 06/12/2011  . GERD (gastroesophageal reflux disease)  . Hypertension  . Pacemaker  . Peripheral vascular disease (CMS-HCC) 06/12/2011  . Presence of automatic implantable cardioverter-defibrillator  . Tobacco abuse 06/12/2011  . Viral cardiomyopathy (CMS-HCC)   Past Surgical History He has a past surgical history that includes Amputation Leg Above Knee Aka (Right); Coronary stent (2004); Colonoscopy (08/13/2011, 5/242007); Sigmoidoscopy Flexible (11/29/2002); egd (10/15/2005, 08/13/2011); Implanted defibrillator/pacemaker placed; Cardiac surgery; Tonsillectomy; extraction cataract extracapsular w/insertion intraocular prosthesis (Right, 03/28/2014); angioplasty left leg; and extraction cataract extracapsular w/insertion intraocular prosthesis (Left, 02/05/2016).   Medications and Allergies  Current Medications  Current Outpatient Prescriptions on File Prior to Visit  Medication Sig Dispense Refill  . metoprolol succinate (TOPROL-XL) 25 MG XL tablet Take 4 tablets (100 mg total) by mouth once daily. 120 tablet 0  . acetaminophen (TYLENOL) 500 MG tablet Take 1,000 mg by mouth every 6 (six) hours as needed for Pain.  Marland Kitchen ALPRAZolam (XANAX) 1 MG tablet Take 1 tablet (1 mg total) by mouth nightly. 30 tablet 3  . aspirin  81 MG EC tablet Take by mouth.  . docusate (COLACE) 100 MG capsule Take 100 mg by mouth every other day.  . fluticasone (FLONASE) 50 mcg/actuation nasal spray USE 2 SPRAYS IN EACH NOSTRIL ONCE DAILY 48 g 3  . fluticasone-salmeterol (ADVAIR HFA) 115-21 mcg/actuation inhaler USE 2 INHALATIONS ORALLY EVERY 12 HOURS  . FUROsemide (LASIX) 20 MG tablet Take 0.5 tablets (10 mg total) by mouth once daily. 30 tablet 11  . ipratropium (ATROVENT) 0.03 % nasal spray Place 2 sprays into both nostrils 2 (two) times daily. 90 mL 3  . ipratropium-albuterol (COMBIVENT RESPIMAT) 20-100 mcg/actuation inhaler Inhale 1 inhalation into the lungs every 4 (four) hours. 3 g 2  . levalbuterol (XOPENEX) 1.25 mg/3 mL nebulizer solution Take 3 mLs (1.25 mg total) by nebulization every 6 (six) hours as needed for Wheezing. 72 ampule 3  . loratadine (CLARITIN) 10 mg tablet Take 1 tablet (10 mg total) by mouth once daily. 30 tablet 11  . lovastatin (MEVACOR) 20 MG tablet TAKE 1 TABLET ONCE DAILY 90 tablet 3  . nitroGLYcerin (NITROSTAT) 0.4 MG SL tablet Place 1 tablet (0.4 mg total) under the tongue every 5 (five) minutes as needed for Chest pain. May take up to 3 doses. 25 tablet 11  . pantoprazole (PROTONIX) 40 MG DR tablet Take 1 tablet (40 mg total) by mouth once daily. 30 tablet 11  . peg-electrolyte (NULYTELY) solution Take 4,000 mLs by mouth as directed. 4000 mL 0  . ramipril (ALTACE) 10 MG capsule TAKE 1 CAPSULE ONCE DAILY 90 capsule 3  . tiotropium bromide 1.25 mcg/actuation Mist Inhale 2.5 mcg into the lungs once daily. 12 g 4  . warfarin (COUMADIN) 5  MG tablet TAKE 1 TABLET ONCE DAILY *TEVA MFR* 90 tablet 3   No current facility-administered medications on file prior to visit.   Allergies: Eliquis [apixaban] and Xarelto [rivaroxaban]  Social and Family History  Social History reports that he quit smoking about 3 years ago. His smoking use included Cigarettes. He has a 42.00 pack-year smoking history. He has never  used smokeless tobacco. He reports that he does not drink alcohol or use drugs.  Family History Family History  Problem Relation Age of Onset  . Cancer Father  Bladder  . Myocardial Infarction (Heart attack) Father  Around 45-50  . Lung cancer Mother  . Coronary Artery Disease (Blocked arteries around heart) Mother  . Colon polyps Mother  . Crohn's disease Other  aunt  . High blood pressure (Hypertension) Other   Review of Systems   Review of Systems  Positive for sob Negative for weight gain weight loss, weakness, vision change, hearing loss, PND, orthopnea, heartburn, nausea, diaphoresis, vomiting, diarrhea, bloody stool, melena, stomach pain, extremity pain, leg weakness, leg cramping, leg blood clots, headache, blackouts, nosebleed, trouble swallowing, mouth pain, urinary frequency, urination at night, muscle weakness, skin lesions, skin rashes, ,ulcers, numbness, anxiety, and/or depression Physical Examination   Vitals:BP 120/66  Pulse 76  Ht 185.4 cm (6\' 1" )  Wt 67.6 kg (149 lb)  SpO2 99%  BMI 19.66 kg/m  Ht:185.4 cm (6\' 1" ) Wt:67.6 kg (149 lb) ZOX:WRUE surface area is 1.87 meters squared. Body mass index is 19.66 kg/m. Appearance: well appearing in no acute distress HEENT: Pupils equally reactive to light and accomodation, no xanthalasma  Neck: Supple, no apparent thyromegaly, masses, or lymphadenopathy  Lungs: normal respiratory effort; no crackles, no rhonchi, diffuse wheezes Heart: Regular rate and rhythm. Normal S1 S2 No gallops, murmur, no rub, PMI is normal size and placement. carotid upstroke normal without bruit. Jugular venous pressure is normal Abdomen: soft, nontender, not distended with normal bowel sounds. No apparent hepatosplenomegally. Abdominal aorta is normal size without bruit Extremities: no edema, no ulcers, no clubbing,  Peripheral Pulses: 2+ in upper extremities, 2+ femoral pulses bilaterally, Right aka Musculoskeletal; Normal muscle tone without  kyphosis Neurological: Oriented and Alert, Cranial nerves intact  Assessment   61 y.o. male with  Encounter Diagnoses  Name Primary?  . Coronary artery disease involving native coronary artery of native heart without angina pectoris Yes  . Chronic systolic CHF (congestive heart failure), NYHA class 3 (CMS-HCC)  . AICD (automatic cardioverter/defibrillator) present   Plan  -The patient is to have consultation and subsequent change of battery of the defibrillator. The patient understands all the risks and benefits of this procedure. This includes the possibility of death, stroke, heart attack, hemopericardium, pneumothorax, infection, bleeding, blood clot, and or reaction to medications. The patient is at low risk for general anesthesia. -The patient will continue following closely for symptoms of cardiovascular disease including shortness of breath, chest discomfort, giving out, having palpitations, and/or significant progression in weakness and/or fatigue. They understand that these symptoms could be a clue to coronary artery disease progression and to report these symptoms as soon as possible. The patient understands to continue current medical regimen for further risk reduction and progression of symptoms and/or cardiovascular disease.  -There has been a long discussion of the risks and benefits of current medical and non-medical treatment of congestive heart failure. The patient will continue to use the maximum measures possible to reduce future exacerbation and hospitalization.   No orders of the defined types were  placed in this encounter.  No Follow-up on file.  Flossie Dibble, MD      Plan of Treatment - as of this encounter   Upcoming Encounters Upcoming Encounters  Date Type Specialty Care Team Description  02/08/2017 Ancillary Orders Lab    02/23/2017 Office Visit Cardiology Flossie Dibble, MD  [address]  [facility]    [place], [place] [address]  [phone]  [phone] (Fax)    03/09/2017 Ancillary Orders Lab Charlett Lango, MD  [place]  [facility] [place], [place] [address]  [phone]  [phone] (Fax)    03/16/2017 Office Visit Internal Medicine Charlett Lango, MD  [place]  [facility] [place], [place] [address]  [phone]  [phone] (Fax)    05/04/2017 Procedure visit Cardiology Flossie Dibble, MD  [address]  [facility]  [place], [place] [address]  [phone]  [phone] (Fax)     Visit Diagnoses    Diagnosis  Coronary artery disease involving native coronary artery of native heart without angina pectoris - Primary  Chronic systolic CHF (congestive heart failure), NYHA class 3 (CMS-HCC)  AICD (automatic cardioverter/defibrillator) present  Automatic implantable cardiac defibrillator in situ    Images Document Information   Primary Care Provider Myrla Halsted MD (Jan. 31, 2013 - Present) [phone] (Work) [phone] (Fax) [facility] [place], [place] [address]  Document Coverage Dates Aug. 16, 2018  [place], [place] [address]   Encounter Providers Flossie Dibble MD (Attending) [phone] (Work) [phone] (Fax) [facility] [place], [place] [address]   Encounter Date Aug. 16, 2018   Show All Sections

## 2017-02-10 NOTE — Anesthesia Procedure Notes (Signed)
Procedure Name: MAC Performed by: Sesilia Poucher Pre-anesthesia Checklist: Patient identified, Emergency Drugs available, Suction available, Patient being monitored and Timeout performed Oxygen Delivery Method: Nasal cannula       

## 2017-02-11 ENCOUNTER — Encounter: Payer: Self-pay | Admitting: Cardiology

## 2017-02-24 ENCOUNTER — Other Ambulatory Visit: Payer: Self-pay | Admitting: Internal Medicine

## 2017-02-24 ENCOUNTER — Telehealth: Payer: Self-pay | Admitting: Internal Medicine

## 2017-02-24 MED ORDER — PREDNISONE 10 MG (21) PO TBPK: ORAL_TABLET | ORAL | 0 refills | Status: DC

## 2017-02-24 NOTE — Telephone Encounter (Signed)
RX sent for Prednisone taper pack (21 tabs). Pt informed. Nothing further needed.

## 2017-02-24 NOTE — Telephone Encounter (Signed)
Sent script for steroid taper.

## 2017-02-24 NOTE — Telephone Encounter (Signed)
More SOB over last couple days. Prod cough with green mucus. Replaced battery in pacemaker within last month. Pt just finished keflex that was given when they changed the battery. States DK had mentioned about getting Prednisone at last OV on the 17th. Denies fever.

## 2017-02-24 NOTE — Telephone Encounter (Signed)
PT is calling and has been short winded the last couple days more than usual PT is wondering if there is any medication he can be prescribed to help Please call to advise

## 2017-04-29 ENCOUNTER — Encounter
Admission: RE | Admit: 2017-04-29 | Discharge: 2017-04-29 | Disposition: A | Discharge status: Home / Self Care | Payer: 59 | Source: Ambulatory Visit | Attending: Gastroenterology | Admitting: Gastroenterology

## 2017-04-29 ENCOUNTER — Encounter: Payer: Self-pay | Admitting: Anesthesiology

## 2017-04-29 NOTE — Progress Notes (Signed)
Pt seen and evaluated.  His pulmonary status is poor at baseline with home o2 requirements but he is doing ok at this time without recent exacerbation.  He is at baseline and would be reasonable risk to proceed with GA for colonoscopy; however, his risk for post op ventilatory support is moderate and has been discussed with him.  He understands and accepts these risks including intubation and icu vent support and mgmt.  JA

## 2017-04-30 ENCOUNTER — Other Ambulatory Visit: Payer: Self-pay

## 2017-04-30 ENCOUNTER — Emergency Department
Admission: EM | Admit: 2017-04-30 | Discharge: 2017-04-30 | Disposition: A | Discharge status: Home / Self Care | Payer: 59 | Attending: Emergency Medicine | Admitting: Emergency Medicine

## 2017-04-30 ENCOUNTER — Encounter: Payer: Self-pay | Admitting: Emergency Medicine

## 2017-04-30 DIAGNOSIS — Z79899 Other long term (current) drug therapy: Secondary | ICD-10-CM | POA: Insufficient documentation

## 2017-04-30 DIAGNOSIS — R531 Weakness: Secondary | ICD-10-CM | POA: Diagnosis present

## 2017-04-30 DIAGNOSIS — R109 Unspecified abdominal pain: Secondary | ICD-10-CM | POA: Diagnosis not present

## 2017-04-30 DIAGNOSIS — Z87891 Personal history of nicotine dependence: Secondary | ICD-10-CM | POA: Diagnosis not present

## 2017-04-30 DIAGNOSIS — I252 Old myocardial infarction: Secondary | ICD-10-CM | POA: Diagnosis not present

## 2017-04-30 DIAGNOSIS — J449 Chronic obstructive pulmonary disease, unspecified: Secondary | ICD-10-CM | POA: Diagnosis not present

## 2017-04-30 DIAGNOSIS — I5022 Chronic systolic (congestive) heart failure: Secondary | ICD-10-CM | POA: Insufficient documentation

## 2017-04-30 DIAGNOSIS — I110 Hypertensive heart disease with heart failure: Secondary | ICD-10-CM | POA: Insufficient documentation

## 2017-04-30 DIAGNOSIS — Z7901 Long term (current) use of anticoagulants: Secondary | ICD-10-CM | POA: Insufficient documentation

## 2017-04-30 DIAGNOSIS — I2510 Atherosclerotic heart disease of native coronary artery without angina pectoris: Secondary | ICD-10-CM | POA: Diagnosis not present

## 2017-04-30 DIAGNOSIS — Z950 Presence of cardiac pacemaker: Secondary | ICD-10-CM | POA: Insufficient documentation

## 2017-04-30 DIAGNOSIS — E871 Hypo-osmolality and hyponatremia: Secondary | ICD-10-CM | POA: Diagnosis not present

## 2017-04-30 DIAGNOSIS — Z7982 Long term (current) use of aspirin: Secondary | ICD-10-CM | POA: Insufficient documentation

## 2017-04-30 DIAGNOSIS — R5383 Other fatigue: Secondary | ICD-10-CM | POA: Insufficient documentation

## 2017-04-30 LAB — BASIC METABOLIC PANEL
ANION GAP: 9 (ref 5–15)
BUN: 8 mg/dL (ref 6–20)
CHLORIDE: 96 mmol/L — AB (ref 101–111)
CO2: 24 mmol/L (ref 22–32)
Calcium: 9.3 mg/dL (ref 8.9–10.3)
Creatinine, Ser: 0.77 mg/dL (ref 0.61–1.24)
GFR calc non Af Amer: >60 mL/min (ref >60)
GLUCOSE: 91 mg/dL (ref 65–99)
POTASSIUM: 4.1 mmol/L (ref 3.5–5.1)
Sodium: 129 mmol/L — ABNORMAL LOW (ref 135–145)

## 2017-04-30 LAB — URINALYSIS, COMPLETE (UACMP) WITH MICROSCOPIC
BACTERIA UA: NONE SEEN
BILIRUBIN URINE: NEGATIVE
Glucose, UA: NEGATIVE mg/dL
Ketones, ur: NEGATIVE mg/dL
Leukocytes, UA: NEGATIVE
Nitrite: NEGATIVE
PROTEIN: NEGATIVE mg/dL
SPECIFIC GRAVITY, URINE: 1.01 (ref 1.005–1.030)
Squamous Epithelial / LPF: NONE SEEN
pH: 6 (ref 5.0–8.0)

## 2017-04-30 LAB — TROPONIN I: Troponin I: <0.03 ng/mL (ref <0.03)

## 2017-04-30 LAB — CBC
HEMATOCRIT: 40.2 % (ref 40.0–52.0)
Hemoglobin: 13.7 g/dL (ref 13.0–18.0)
MCH: 29.6 pg (ref 26.0–34.0)
MCHC: 34.1 g/dL (ref 32.0–36.0)
MCV: 86.8 fL (ref 80.0–100.0)
Platelets: 279 10*3/uL (ref 150–440)
RBC: 4.63 MIL/uL (ref 4.40–5.90)
RDW: 14.3 % (ref 11.5–14.5)
WBC: 8.1 10*3/uL (ref 3.8–10.6)

## 2017-04-30 LAB — TSH: TSH: 1.04 u[IU]/mL (ref 0.350–4.500)

## 2017-04-30 LAB — LACTIC ACID, PLASMA: LACTIC ACID, VENOUS: 1.3 mmol/L (ref 0.5–1.9)

## 2017-04-30 MED ORDER — SODIUM CHLORIDE 0.9 % IV BOLUS (SEPSIS)
500.0000 mL | Freq: Once | INTRAVENOUS | Status: AC
  Administered 2017-04-30: 500 mL via INTRAVENOUS

## 2017-04-30 NOTE — Discharge Instructions (Signed)
Please seek medical attention for any high fevers, chest pain, shortness of breath, change in behavior, persistent vomiting, bloody stool or any other new or concerning symptoms.  

## 2017-04-30 NOTE — ED Triage Notes (Signed)
Pt to ED via POV c/o weakness x 4-5 days. Pt states that he does not have any energy. Pt recently finished antibiotic 2 days ago for infection in his toe. Pt denies fevers but states that he has felt hot. Pt in NAD at this time.

## 2017-04-30 NOTE — ED Provider Notes (Signed)
[facility] Emergency Department Provider Note    ____________________________________________   I have reviewed the triage vital signs and the nursing notes.   HISTORY  Chief Complaint Fatigue  History limited by: Not Limited   HPI Tyler [name] is a 61 y.o. male who presents to the emergency department today because of fatigue   LOCATION:diffuse DURATION:4-5 days TIMING: constant SEVERITY: severe QUALITY: extreme weakness, feels like his arms weigh a ton CONTEXT: patient recently finished a course of antibiotics 2 days ago for toe infection. States for the past 4-5 days has been feeling increasingly weak. Did see his doctor yesterday for toe follow up but did not mention the fatigue to his doctor at that time. MODIFYING FACTORS: none ASSOCIATED SYMPTOMS: some abdominal discomfort. One episode of diarrhea. No chest pain. No shortness of breath. No measured fevers but he has felt hot and cold.  Per medical record review patient has a history of CHF, COPD, MI.  Past Medical History:  Diagnosis Date  . AICD (automatic cardioverter/defibrillator) present   . Atrial fibrillation ([place])   . CHF (congestive heart failure) ([place])   . COPD (chronic obstructive pulmonary disease) ([place])   . Coronary artery disease   . Depression   . Dyspnea   . GERD (gastroesophageal reflux disease)   . Hypertension   . Myocardial infarction ([place])   . Peripheral vascular disease ([place])   . Presence of permanent cardiac pacemaker     Patient Active Problem List   Diagnosis Date Noted  . COPD with acute exacerbation ([place]) 10/13/2016  . Acute respiratory failure with hypoxia and hypercapnia ([place]) 10/13/2016  . Chronic systolic CHF (congestive heart failure) ([place]) 10/13/2016  . AF (paroxysmal atrial fibrillation) ([place]) 10/13/2016  . Hx of AKA (above knee amputation), right ([place]) 05/15/2016  . Essential hypertension, benign 05/15/2016  . PVD (peripheral vascular  disease) ([place]) 05/15/2016    Past Surgical History:  Procedure Laterality Date  . ABOVE KNEE LEG AMPUTATION Right    after below the knee amputation   . BELOW KNEE LEG AMPUTATION Right   . CATARACT EXTRACTION, BILATERAL Bilateral   . IMPLANTABLE CARDIOVERTER DEFIBRILLATOR (ICD) GENERATOR CHANGE Left 02/10/2017   Procedure: ICD GENERATOR CHANGE;  Surgeon: Isaias Cowman, MD;  Location: [facility] ORS;  Service: Cardiovascular;  Laterality: Left;  . INSERT / REPLACE / REMOVE PACEMAKER    . PERIPHERAL VASCULAR CATHETERIZATION Left 12/09/2015   Procedure: Lower Extremity Angiography;  Surgeon: Algernon Huxley, MD;  Location: [place] [facility];  Service: Cardiovascular;  Laterality: Left;  . PERIPHERAL VASCULAR CATHETERIZATION  12/09/2015   Procedure: Lower Extremity Intervention;  Surgeon: Algernon Huxley, MD;  Location: [place] [facility];  Service: Cardiovascular;;  . TONSILLECTOMY      Prior to Admission medications   Medication Sig Start Date End Date Taking? Authorizing Provider  acetaminophen (TYLENOL) 500 MG tablet Take 1,000 mg by mouth daily as needed for moderate pain or headache.    [provider]  ALPRAZolam Duanne Moron) 1 MG tablet Take 1 mg by mouth at bedtime.    [provider]  aspirin EC 81 MG tablet Take 1 tablet (81 mg total) by mouth daily. 12/09/15   Algernon Huxley, MD  cephALEXin (KEFLEX) 250 MG capsule Take 1 capsule (250 mg total) by mouth 4 (four) times daily. 02/10/17   Paraschos, Alexander, MD  docusate sodium (COLACE) 100 MG capsule Take 100 mg by mouth every other day.    [provider]  fluticasone (FLONASE) 50 MCG/ACT nasal spray Place 2 sprays into both nostrils daily.    [provider]  fluticasone-salmeterol (ADVAIR HFA) 115-21 MCG/ACT inhaler USE 2 INHALATIONS ORALLY   EVERY 12 HOURS 12/21/16   Flora Lipps, MD  furosemide (LASIX) 20 MG tablet Take 10 mg by mouth every other day.    [provider]  ipratropium (ATROVENT)  0.03 % nasal spray Place 2 sprays into both nostrils every 12 (twelve) hours.    [provider]  Ipratropium-Albuterol (COMBIVENT RESPIMAT) 20-100 MCG/ACT AERS respimat Inhale 1 puff into the lungs every 4 (four) hours as needed for wheezing.    [provider]  levalbuterol (XOPENEX) 1.25 MG/3ML nebulizer solution Take 1.25 mg (3 mLs total) by nebulization every 4 (four) hours. Patient taking differently: Take 3 mLs by nebulization every 4 (four) hours as needed for wheezing or shortness of breath.  10/23/16   Theodoro Grist, MD  loratadine (CLARITIN) 10 MG tablet Take 10 mg by mouth at bedtime.    [provider]  lovastatin (MEVACOR) 20 MG tablet Take 20 mg by mouth at bedtime.    [provider]  metoprolol succinate (TOPROL-XL) 25 MG 24 hr tablet Take 25 mg by mouth daily.    [provider]  nystatin ointment (MYCOSTATIN) Apply 1 application topically daily as needed for rash. 12/10/16   [provider]  pantoprazole (PROTONIX) 40 MG tablet Take 40 mg by mouth daily.    [provider]  predniSONE (STERAPRED UNI-PAK 21 TAB) 10 MG (21) TBPK tablet As directed 02/24/17   Laverle Hobby, MD  predniSONE (STERAPRED UNI-PAK 21 TAB) 10 MG (21) TBPK tablet Take as directed per taper 02/24/17   Laverle Hobby, MD  ramipril (ALTACE) 10 MG capsule Take 10 mg by mouth at bedtime.     [provider]  simethicone (MYLICON) 80 MG chewable tablet Chew 1 tablet (80 mg total) by mouth 4 (four) times daily. Patient taking differently: Chew 80 mg by mouth 2 (two) times daily as needed for flatulence.  10/23/16   Theodoro Grist, MD  Tiotropium Bromide Monohydrate (SPIRIVA RESPIMAT) 1.25 MCG/ACT AERS Inhale 2 puffs into the lungs daily. 02/01/17   Flora Lipps, MD  warfarin (COUMADIN) 5 MG tablet Take 5 mg by mouth every evening.     [provider]    Allergies Apixaban and Rivaroxaban  Family History  Problem Relation  Age of Onset  . Cancer Mother   . Cancer Father   . Heart disease Father     Social History Social History   Tobacco Use  . Smoking status: Former Smoker    Packs/day: 1.00    Years: 42.00    Pack years: 42.00    Types: Cigarettes    Last attempt to quit: 09/23/2013    Years since quitting: 3.6  . Smokeless tobacco: Never Used  Substance Use Topics  . Alcohol use: No  . Drug use: No    Review of Systems Constitutional: No fever/chills Eyes: No visual changes. ENT: No sore throat. Cardiovascular: Denies chest pain. Respiratory: Denies shortness of breath. Gastrointestinal: No abdominal pain.  No nausea, no vomiting.  No diarrhea.   Genitourinary: Negative for dysuria. Musculoskeletal: Negative for back pain. Skin: Negative for rash. Neurological: Negative for headaches, focal weakness or numbness.  ____________________________________________   PHYSICAL EXAM:  VITAL SIGNS: ED Triage Vitals  Enc Vitals Group     BP 04/30/17 1448 (!) 131/95     Pulse Rate 04/30/17 1448  76     Resp 04/30/17 1448 16     Temp 04/30/17 1448 97.6 F (36.4 C)     Temp Source 04/30/17 1448 Oral     SpO2 04/30/17 1448 99 %     Weight 04/30/17 1449 150 lb (68 kg)     Height 04/30/17 1449 6\' 1"  (1.854 m)     Head Circumference --      Peak Flow --      Pain Score --      Pain Loc --      Pain Edu? --      Excl. in [place]? --      Constitutional: Alert and oriented. Well appearing and in no distress. Eyes: Conjunctivae are normal.  ENT   Head: Normocephalic and atraumatic.   Nose: No congestion/rhinnorhea.   Mouth/Throat: Mucous membranes are moist.   Neck: No stridor. Hematological/Lymphatic/Immunilogical: No cervical lymphadenopathy. Cardiovascular: Normal rate, regular rhythm.  No murmurs, rubs, or gallops.  Respiratory: Normal respiratory effort without tachypnea nor retractions. Breath sounds are clear and equal bilaterally. No  wheezes/rales/rhonchi. Gastrointestinal: Soft and non tender. No rebound. No guarding.  Genitourinary: Deferred Musculoskeletal: Normal range of motion in all extremities. S/p right lower extremity amputation Neurologic:  Normal speech and language. No gross focal neurologic deficits are appreciated.  Skin:  Skin is warm, dry and intact. No rash noted. Psychiatric: Mood and affect are normal. Speech and behavior are normal. Patient exhibits appropriate insight and judgment.  ____________________________________________    LABS (pertinent positives/negatives)  CBC wnl BMP na 129, k 4.1 TSH 1.040 Trop <0.03 UA not consistent with infection ____________________________________________   EKG  I, Nance Pear, attending physician, personally viewed and interpreted this EKG  EKG Time: 1501 Rate: 76 Rhythm: ventricular paced rhythm Axis: left axis deviation Intervals: qtc 526 QRS: wide ST changes: no st elevation Impression: abnormal ekg  ____________________________________________    RADIOLOGY  None  ____________________________________________   PROCEDURES  Procedures  ____________________________________________   INITIAL IMPRESSION / ASSESSMENT AND PLAN / ED COURSE  Pertinent labs & imaging results that were available during my care of the patient were reviewed by me and considered in my medical decision making (see chart for details).  Patient presented to the emergency department today because of concerns for fatigue.  Differential would be broad including anemia, electrolyte abnormality,, thyroid disorder amongst other etiologies.  Workup she does show a slightly low sodium level however patient has chronic hyponatremia.  Given that this is not far from patient's baseline I have low suspicion this is causing the patient's symptoms.  No signs of infection no other concerning findings on workup.  This point I do feel it is safe for patient be discharged.   Discussed with patient importance of following up with primary care.   ____________________________________________   FINAL CLINICAL IMPRESSION(S) / ED DIAGNOSES  Final diagnoses:  Weakness  Hyponatremia     Note: This dictation was prepared with Dragon dictation. Any transcriptional errors that result from this process are unintentional     Nance Pear, MD 04/30/17 1958

## 2017-04-30 NOTE — ED Notes (Signed)

## 2017-05-05 ENCOUNTER — Other Ambulatory Visit
Admission: RE | Admit: 2017-05-05 | Discharge: 2017-05-05 | Disposition: A | Discharge status: Home / Self Care | Payer: 59 | Source: Ambulatory Visit | Attending: Internal Medicine | Admitting: Internal Medicine

## 2017-05-05 DIAGNOSIS — J9610 Chronic respiratory failure, unspecified whether with hypoxia or hypercapnia: Secondary | ICD-10-CM | POA: Insufficient documentation

## 2017-05-05 LAB — BLOOD GAS, ARTERIAL
Acid-Base Excess: 1 mmol/L (ref 0.0–2.0)
BICARBONATE: 25.2 mmol/L (ref 20.0–28.0)
FIO2: 0.28
O2 SAT: 97.4 %
PATIENT TEMPERATURE: 37
pCO2 arterial: 38 mmHg (ref 32.0–48.0)
pH, Arterial: 7.43 (ref 7.350–7.450)
pO2, Arterial: 93 mmHg (ref 83.0–108.0)

## 2017-05-07 ENCOUNTER — Ambulatory Visit: Admission: RE | Admit: 2017-05-07 | Payer: 59 | Source: Ambulatory Visit | Admitting: Gastroenterology

## 2017-05-07 ENCOUNTER — Encounter: Admission: RE | Payer: Self-pay | Source: Ambulatory Visit

## 2017-05-07 SURGERY — COLONOSCOPY WITH PROPOFOL
Anesthesia: General

## 2017-05-13 ENCOUNTER — Other Ambulatory Visit (INDEPENDENT_AMBULATORY_CARE_PROVIDER_SITE_OTHER): Payer: Self-pay | Admitting: Vascular Surgery

## 2017-05-13 DIAGNOSIS — I739 Peripheral vascular disease, unspecified: Secondary | ICD-10-CM

## 2017-05-13 NOTE — Assessment & Plan Note (Signed)
On anticoagulation 

## 2017-05-13 NOTE — Assessment & Plan Note (Signed)
blood pressure control important in reducing the progression of atherosclerotic disease. On appropriate oral medications.  

## 2017-05-13 NOTE — Progress Notes (Signed)
MRN : [id number]  Tyler [name] is a 61 y.o. (April 07, 1956) male who presents with chief complaint of  Chief Complaint  Patient presents with  . Follow-up    39moabi lle art   History of Present Illness: Patient returns today in follow up of f peripheral artery disease.  The patient does have a past medical history of a right above-the-knee amputation with multiple endovascular interventions of the left lower extremity.  The patient presents today with worsening claudication-like symptoms to the left calf.  The patient has noticed a shortening in his claudication distance.  The patient denies any rest pain or ulceration to the left lower extremity.  The patient underwent a left lower extremity ABI which was notable for unable to obtain a reliable left ankle brachial indices due to medial calcification.  The left lower extremity arterial duplex was notable for biphasic blood flow transitioning to monophasic at the tibial.  When compared to the previous exam on November 13, 2016 there really is not any significant change.  Patient denies any fever, nausea or vomiting.  Current Outpatient Medications  Medication Sig Dispense Refill  . acetaminophen (TYLENOL) 500 MG tablet Take 1,000 mg by mouth daily as needed for moderate pain or headache.    . ALPRAZolam (XANAX) 1 MG tablet Take 1 mg by mouth at bedtime.    .Marland Kitchenaspirin EC 81 MG tablet Take 1 tablet (81 mg total) by mouth daily. 150 tablet 2  . cephALEXin (KEFLEX) 250 MG capsule Take 1 capsule (250 mg total) by mouth 4 (four) times daily. 28 capsule 0  . docusate sodium (COLACE) 100 MG capsule Take 100 mg by mouth every other day.    . fluticasone (FLONASE) 50 MCG/ACT nasal spray Place 2 sprays into both nostrils daily.    . fluticasone-salmeterol (ADVAIR HFA) 115-21 MCG/ACT inhaler USE 2 INHALATIONS ORALLY   EVERY 12 HOURS 12 g 5  . furosemide (LASIX) 20 MG tablet Take 10 mg by mouth every other day.    . ipratropium (ATROVENT) 0.03 % nasal spray  Place 2 sprays into both nostrils every 12 (twelve) hours.    . Ipratropium-Albuterol (COMBIVENT RESPIMAT) 20-100 MCG/ACT AERS respimat Inhale 1 puff into the lungs every 4 (four) hours as needed for wheezing.    . levalbuterol (XOPENEX) 1.25 MG/3ML nebulizer solution Take 1.25 mg (3 mLs total) by nebulization every 4 (four) hours. (Patient taking differently: Take 3 mLs by nebulization every 4 (four) hours as needed for wheezing or shortness of breath. ) 72 mL 12  . loratadine (CLARITIN) 10 MG tablet Take 10 mg by mouth at bedtime.    . lovastatin (MEVACOR) 20 MG tablet Take 20 mg by mouth at bedtime.    . metoprolol succinate (TOPROL-XL) 25 MG 24 hr tablet Take 25 mg by mouth daily.    .Marland Kitchennystatin ointment (MYCOSTATIN) Apply 1 application topically daily as needed for rash.  0  . pantoprazole (PROTONIX) 40 MG tablet Take 40 mg by mouth daily.    . predniSONE (STERAPRED UNI-PAK 21 TAB) 10 MG (21) TBPK tablet As directed 1 tablet 0  . predniSONE (STERAPRED UNI-PAK 21 TAB) 10 MG (21) TBPK tablet Take as directed per taper 21 tablet 0  . ramipril (ALTACE) 10 MG capsule Take 10 mg by mouth at bedtime.     . simethicone (MYLICON) 80 MG chewable tablet Chew 1 tablet (80 mg total) by mouth 4 (four) times daily. (Patient taking differently: Chew 80 mg by mouth  2 (two) times daily as needed for flatulence. ) 30 tablet 0  . Tiotropium Bromide Monohydrate (SPIRIVA RESPIMAT) 1.25 MCG/ACT AERS Inhale 2 puffs into the lungs daily. 12 g 2  . warfarin (COUMADIN) 5 MG tablet Take 5 mg by mouth every evening.      No current facility-administered medications for this visit.    Past Medical History:  Diagnosis Date  . AICD (automatic cardioverter/defibrillator) present   . Atrial fibrillation ([place])   . CHF (congestive heart failure) ([place])   . COPD (chronic obstructive pulmonary disease) ([place])   . Coronary artery disease   . Depression   . Dyspnea   . GERD (gastroesophageal reflux disease)   . Hypertension     . Myocardial infarction ([place])   . Peripheral vascular disease ([place])   . Presence of permanent cardiac pacemaker    Past Surgical History:  Procedure Laterality Date  . ABOVE KNEE LEG AMPUTATION Right    after below the knee amputation   . BELOW KNEE LEG AMPUTATION Right   . CATARACT EXTRACTION, BILATERAL Bilateral   . IMPLANTABLE CARDIOVERTER DEFIBRILLATOR (ICD) GENERATOR CHANGE Left 02/10/2017   Procedure: ICD GENERATOR CHANGE;  Surgeon: Isaias Cowman, MD;  Location: [facility] ORS;  Service: Cardiovascular;  Laterality: Left;  . INSERT / REPLACE / REMOVE PACEMAKER    . PERIPHERAL VASCULAR CATHETERIZATION Left 12/09/2015   Procedure: Lower Extremity Angiography;  Surgeon: Algernon Huxley, MD;  Location: [place] [facility];  Service: Cardiovascular;  Laterality: Left;  . PERIPHERAL VASCULAR CATHETERIZATION  12/09/2015   Procedure: Lower Extremity Intervention;  Surgeon: Algernon Huxley, MD;  Location: [place] [facility];  Service: Cardiovascular;;  . TONSILLECTOMY     Social History Social History   Tobacco Use  . Smoking status: Former Smoker    Packs/day: 1.00    Years: 42.00    Pack years: 42.00    Types: Cigarettes    Last attempt to quit: 09/23/2013    Years since quitting: 3.6  . Smokeless tobacco: Never Used  Substance Use Topics  . Alcohol use: No  . Drug use: No   Family History Family History  Problem Relation Age of Onset  . Cancer Mother   . Cancer Father   . Heart disease Father    Allergies  Allergen Reactions  . Apixaban Rash  . Rivaroxaban Rash   REVIEW OF SYSTEMS(Negative unless checked)  Constitutional: '[]' Weight loss'[]' Fever'[]' Chills Cardiac:'[]' Chest pain'[]' Chest pressure'[]' Palpitations '[]' Shortness of breath when laying flat '[]' Shortness of breath at rest '[]' Shortness of breath with exertion. Vascular: '[]' Pain in legs with walking'[x]' Pain in legsat rest'[]' Pain in legs when laying flat '[]' Claudication '[]' Pain in feet when walking  '[]' Pain in feet at rest '[]' Pain in feet when laying flat '[]' History of DVT '[]' Phlebitis '[x]' Swelling in legs '[x]' Varicose veins '[]' Non-healing ulcers Pulmonary: '[x]' Uses home oxygen '[]' Productive cough'[]' Hemoptysis '[]' Wheeze '[x]' COPD '[]' Asthma Neurologic: '[]' Dizziness '[]' Blackouts '[]' Seizures '[]' History of stroke '[]' History of TIA'[]' Aphasia '[]' Temporary blindness'[]' Dysphagia '[]' Weaknessor numbness in arms '[]' Weakness or numbnessin legs Musculoskeletal: '[x]' Arthritis '[]' Joint swelling '[x]' Joint pain '[]' Low back pain Hematologic:'[]' Easy bruising'[]' Easy bleeding '[]' Hypercoagulable state '[]' Anemic  Gastrointestinal:'[]' Blood in stool'[]' Vomiting blood'[]' Gastroesophageal reflux/heartburn'[]' Abdominal pain Genitourinary: '[]' Chronic kidney disease '[]' Difficulturination '[]' Frequenturination '[]' Burning with urination'[]' Hematuria Skin: '[]' Rashes '[]' Ulcers '[]' Wounds Psychological: '[]' History of anxiety'[x]' History of major depression.  Physical Examination  BP 140/73 (BP Location: Left Arm)   Pulse 84   Resp 16   Wt 149 lb (67.6 kg)   BMI 19.66 kg/m  Gen:  WD/WN, NAD Head: [place]/AT, No temporalis wasting. Ear/Nose/Throat: Hearing grossly intact, nares w/o erythema or drainage, trachea midline Eyes:  Conjunctiva clear. Sclera non-icteric Neck: Supple.  No JVD.  Pulmonary:  Good air movement, no use of accessory muscles.  Cardiac: RRR, normal S1, S2 Vascular:  Vessel Right Left  Radial Palpable Palpable  Ulnar Palpable Palpable  Brachial Palpable Palpable  Carotid Palpable, without bruit Palpable, without bruit  Aorta Not palpable N/A  Femoral Palpable Palpable  Popliteal  Non-palpable  PT  Non-palpable  DP  Non-palpable   Right AKA: Stump is intact and healthy Left lower extremity: Hard to palpate pedal pulses however the skin is intact  Gastrointestinal: soft, non-tender/non-distended. No guarding/reflex.  Musculoskeletal: M/S 5/5 throughout.  No  deformity or atrophy. mild edema. Neurologic: Sensation grossly intact in extremities.  Symmetrical.  Speech is fluent.  Psychiatric: Judgment intact, Mood & affect appropriate for pt's clinical situation. Dermatologic: No rashes or ulcers noted.  No cellulitis or open wounds. Lymph : No Cervical, Axillary, or Inguinal lymphadenopathy.  Labs Recent Results (from the past 2160 hour(s))  Urinalysis, Complete w Microscopic     Status: Abnormal   Collection Time: 04/30/17 12:26 PM  Result Value Ref Range   Color, Urine YELLOW (A) YELLOW   APPearance CLEAR (A) CLEAR   Specific Gravity, Urine 1.010 1.005 - 1.030   pH 6.0 5.0 - 8.0   Glucose, UA NEGATIVE NEGATIVE mg/dL   Hgb urine dipstick SMALL (A) NEGATIVE   Bilirubin Urine NEGATIVE NEGATIVE   Ketones, ur NEGATIVE NEGATIVE mg/dL   Protein, ur NEGATIVE NEGATIVE mg/dL   Nitrite NEGATIVE NEGATIVE   Leukocytes, UA NEGATIVE NEGATIVE   RBC / HPF 0-5 0 - 5 RBC/hpf   WBC, UA 0-5 0 - 5 WBC/hpf   Bacteria, UA NONE SEEN NONE SEEN   Squamous Epithelial / LPF NONE SEEN NONE SEEN  Basic metabolic panel     Status: Abnormal   Collection Time: 04/30/17  2:50 PM  Result Value Ref Range   Sodium 129 (L) 135 - 145 mmol/L   Potassium 4.1 3.5 - 5.1 mmol/L   Chloride 96 (L) 101 - 111 mmol/L   CO2 24 22 - 32 mmol/L   Glucose, Bld 91 65 - 99 mg/dL   BUN 8 6 - 20 mg/dL   Creatinine, Ser 0.77 0.61 - 1.24 mg/dL   Calcium 9.3 8.9 - 10.3 mg/dL   GFR calc non Af Amer >60 >60 mL/min   GFR calc Af Amer >60 >60 mL/min    Comment: (NOTE) The eGFR has been calculated using the CKD EPI equation. This calculation has not been validated in all clinical situations. eGFR's persistently <60 mL/min signify possible Chronic Kidney Disease.    Anion gap 9 5 - 15  CBC     Status: None   Collection Time: 04/30/17  2:50 PM  Result Value Ref Range   WBC 8.1 3.8 - 10.6 K/uL   RBC 4.63 4.40 - 5.90 MIL/uL   Hemoglobin 13.7 13.0 - 18.0 g/dL   HCT 40.2 40.0 - 52.0 %    MCV 86.8 80.0 - 100.0 fL   MCH 29.6 26.0 - 34.0 pg   MCHC 34.1 32.0 - 36.0 g/dL   RDW 14.3 11.5 - 14.5 %   Platelets 279 150 - 440 K/uL  TSH     Status: None   Collection Time: 04/30/17  2:50 PM  Result Value Ref Range   TSH 1.040 0.350 - 4.500 uIU/mL    Comment: Performed by a 3rd Generation assay with a functional sensitivity of <=0.01 uIU/mL.  Troponin I  Status: None   Collection Time: 04/30/17  2:50 PM  Result Value Ref Range   Troponin I <0.03 <0.03 ng/mL  Lactic acid, plasma     Status: None   Collection Time: 04/30/17  4:12 PM  Result Value Ref Range   Lactic Acid, Venous 1.3 0.5 - 1.9 mmol/L  Blood gas, arterial     Status: None   Collection Time: 05/05/17  4:04 PM  Result Value Ref Range   FIO2 0.28    Delivery systems NASAL CANNULA    pH, Arterial 7.43 7.350 - 7.450   pCO2 arterial 38 32.0 - 48.0 mmHg   pO2, Arterial 93 83.0 - 108.0 mmHg   Bicarbonate 25.2 20.0 - 28.0 mmol/L   Acid-Base Excess 1.0 0.0 - 2.0 mmol/L   O2 Saturation 97.4 %   Patient temperature 37.0    Collection site RIGHT BRACHIAL    Sample type ARTERIAL DRAW    Allens test (pass/fail) PASS PASS  VAS [place] LOWER EXTREMITY ARTERIAL DUPLEX     Status: None (In process)   Collection Time: 05/14/17  2:38 PM  Result Value Ref Range   Right super femoral prox sys PSV 264 cm/s   Right super femoral mid sys PSV -108 cm/s   Right super femoral dist sys PSV -49 cm/s   Right popliteal prox sys PSV 53 cm/s   Right popliteal dist sys PSV 72 cm/s   Right peroneal sys PSV -114 cm/s   RIGHT ANT DIST TIBAL SYS PSV 67 cm/s   RIGHT POST TIB DIST SYS -89 cm/s   Radiology No results found.  Assessment/Plan Patient returns today in follow up of f peripheral artery disease.  The patient does have a past medical history of a right above-the-knee amputation with multiple endovascular interventions of the left lower extremity.  The patient presents today with worsening claudication-like symptoms to the left calf.   The patient has noticed a shortening in his claudication distance.  The patient denies any rest pain or ulceration to the left lower extremity.  The patient underwent a left lower extremity ABI which was notable for unable to obtain a reliable left ankle brachial indices due to medial calcification.  The left lower extremity arterial duplex was notable for biphasic blood flow transitioning to monophasic at the tibial.  When compared to the previous exam on November 13, 2016 there really is not any significant change.  Patient denies any fever, nausea or vomiting.  Essential hypertension, benign blood pressure control important in reducing the progression of atherosclerotic disease. On appropriate oral medications.   AF (paroxysmal atrial fibrillation) (HCC) On anticoagulation  PAD - Stable Patient does have a past medical history of peripheral artery disease requiring multiple endovascular interventions to the left lower extremity The patient presents today with worsening left calf claudication.  There is a noticeable shortening in his claudication distance The patient's ABI/left lower extremity arterial duplex is relatively stable however the patient is presenting symptomatically The patient states his symptoms have become lifestyle limiting Recommend a left lower extremity angiogram with possible intervention in an attempt to assess the patient's anatomy and degree of peripheral artery disease Procedure, risks and benefits explained to the patient All questions answered Patient wishes to proceed  Sela Hua, PA-C  05/14/2017 3:36 PM  This note was created with Dragon medical transcription system.  Any errors from dictation are purely unintentional

## 2017-05-14 ENCOUNTER — Ambulatory Visit (INDEPENDENT_AMBULATORY_CARE_PROVIDER_SITE_OTHER): Payer: 59

## 2017-05-14 ENCOUNTER — Ambulatory Visit (INDEPENDENT_AMBULATORY_CARE_PROVIDER_SITE_OTHER): Payer: 59 | Admitting: Vascular Surgery

## 2017-05-14 ENCOUNTER — Encounter (INDEPENDENT_AMBULATORY_CARE_PROVIDER_SITE_OTHER): Payer: Self-pay | Admitting: Vascular Surgery

## 2017-05-14 VITALS — BP 140/73 | HR 84 | Resp 16 | Wt 149.0 lb

## 2017-05-14 DIAGNOSIS — I739 Peripheral vascular disease, unspecified: Secondary | ICD-10-CM

## 2017-05-14 DIAGNOSIS — I480 Paroxysmal atrial fibrillation: Secondary | ICD-10-CM

## 2017-05-14 DIAGNOSIS — I10 Essential (primary) hypertension: Secondary | ICD-10-CM | POA: Diagnosis not present

## 2017-05-19 ENCOUNTER — Emergency Department
Admission: EM | Admit: 2017-05-19 | Discharge: 2017-05-19 | Disposition: A | Discharge status: Home / Self Care | Payer: 59 | Attending: Emergency Medicine | Admitting: Emergency Medicine

## 2017-05-19 ENCOUNTER — Encounter (INDEPENDENT_AMBULATORY_CARE_PROVIDER_SITE_OTHER): Payer: Self-pay

## 2017-05-19 ENCOUNTER — Encounter: Payer: Self-pay | Admitting: Emergency Medicine

## 2017-05-19 ENCOUNTER — Emergency Department: Payer: 59

## 2017-05-19 DIAGNOSIS — Z87891 Personal history of nicotine dependence: Secondary | ICD-10-CM | POA: Insufficient documentation

## 2017-05-19 DIAGNOSIS — R031 Nonspecific low blood-pressure reading: Secondary | ICD-10-CM | POA: Diagnosis present

## 2017-05-19 DIAGNOSIS — Z95810 Presence of automatic (implantable) cardiac defibrillator: Secondary | ICD-10-CM | POA: Insufficient documentation

## 2017-05-19 DIAGNOSIS — L03032 Cellulitis of left toe: Secondary | ICD-10-CM | POA: Diagnosis not present

## 2017-05-19 DIAGNOSIS — Z7982 Long term (current) use of aspirin: Secondary | ICD-10-CM | POA: Diagnosis not present

## 2017-05-19 DIAGNOSIS — I110 Hypertensive heart disease with heart failure: Secondary | ICD-10-CM | POA: Insufficient documentation

## 2017-05-19 DIAGNOSIS — J449 Chronic obstructive pulmonary disease, unspecified: Secondary | ICD-10-CM | POA: Diagnosis not present

## 2017-05-19 DIAGNOSIS — I4891 Unspecified atrial fibrillation: Secondary | ICD-10-CM | POA: Insufficient documentation

## 2017-05-19 DIAGNOSIS — I5022 Chronic systolic (congestive) heart failure: Secondary | ICD-10-CM | POA: Diagnosis not present

## 2017-05-19 DIAGNOSIS — L089 Local infection of the skin and subcutaneous tissue, unspecified: Secondary | ICD-10-CM

## 2017-05-19 DIAGNOSIS — I739 Peripheral vascular disease, unspecified: Secondary | ICD-10-CM | POA: Diagnosis not present

## 2017-05-19 DIAGNOSIS — Z79899 Other long term (current) drug therapy: Secondary | ICD-10-CM | POA: Diagnosis not present

## 2017-05-19 LAB — URINALYSIS, COMPLETE (UACMP) WITH MICROSCOPIC
Bacteria, UA: NONE SEEN
Bilirubin Urine: NEGATIVE
GLUCOSE, UA: NEGATIVE mg/dL
Hgb urine dipstick: NEGATIVE
KETONES UR: NEGATIVE mg/dL
Leukocytes, UA: NEGATIVE
Nitrite: NEGATIVE
PH: 7 (ref 5.0–8.0)
Protein, ur: NEGATIVE mg/dL
SPECIFIC GRAVITY, URINE: 1.006 (ref 1.005–1.030)
WBC, UA: NONE SEEN WBC/hpf (ref 0–5)

## 2017-05-19 LAB — CBC WITH DIFFERENTIAL/PLATELET
BASOS ABS: 0 10*3/uL (ref 0–0.1)
Basophils Relative: 0 %
EOS ABS: 0 10*3/uL (ref 0–0.7)
EOS PCT: 0 %
HCT: 41 % (ref 40.0–52.0)
Hemoglobin: 13.8 g/dL (ref 13.0–18.0)
LYMPHS ABS: 0.8 10*3/uL — AB (ref 1.0–3.6)
Lymphocytes Relative: 6 %
MCH: 29.8 pg (ref 26.0–34.0)
MCHC: 33.6 g/dL (ref 32.0–36.0)
MCV: 88.6 fL (ref 80.0–100.0)
MONO ABS: 0.6 10*3/uL (ref 0.2–1.0)
Monocytes Relative: 5 %
Neutro Abs: 11.7 10*3/uL — ABNORMAL HIGH (ref 1.4–6.5)
Neutrophils Relative %: 89 %
PLATELETS: 327 10*3/uL (ref 150–440)
RBC: 4.63 MIL/uL (ref 4.40–5.90)
RDW: 14.7 % — AB (ref 11.5–14.5)
WBC: 13.1 10*3/uL — AB (ref 3.8–10.6)

## 2017-05-19 LAB — COMPREHENSIVE METABOLIC PANEL
ALK PHOS: 62 U/L (ref 38–126)
ALT: 17 U/L (ref 17–63)
AST: 24 U/L (ref 15–41)
Albumin: 4.5 g/dL (ref 3.5–5.0)
Anion gap: 9 (ref 5–15)
BILIRUBIN TOTAL: 1.1 mg/dL (ref 0.3–1.2)
BUN: 10 mg/dL (ref 6–20)
CO2: 28 mmol/L (ref 22–32)
CREATININE: 0.84 mg/dL (ref 0.61–1.24)
Calcium: 9.4 mg/dL (ref 8.9–10.3)
Chloride: 94 mmol/L — ABNORMAL LOW (ref 101–111)
GFR calc Af Amer: >60 mL/min (ref >60)
GLUCOSE: 108 mg/dL — AB (ref 65–99)
Potassium: 4.3 mmol/L (ref 3.5–5.1)
Sodium: 131 mmol/L — ABNORMAL LOW (ref 135–145)
TOTAL PROTEIN: 7.2 g/dL (ref 6.5–8.1)

## 2017-05-19 LAB — LACTIC ACID, PLASMA
Lactic Acid, Venous: 1.7 mmol/L (ref 0.5–1.9)
Lactic Acid, Venous: 1.3 mmol/L (ref 0.5–1.9)

## 2017-05-19 MED ORDER — SODIUM CHLORIDE 0.9 % IV BOLUS (SEPSIS)
500.0000 mL | Freq: Once | INTRAVENOUS | Status: AC
  Administered 2017-05-19: 500 mL via INTRAVENOUS

## 2017-05-19 MED ORDER — DOXYCYCLINE HYCLATE 100 MG PO CAPS: 100.0000 mg | ORAL_CAPSULE | Freq: Two times a day (BID) | ORAL | 0 refills | Status: DC

## 2017-05-19 NOTE — Discharge Instructions (Signed)
At this time we are very reassured by all of your findings however if you feel worse in any way including fever, lightheadedness, worsening infection in your toe, or you feel worse in any way, return immediately to the emergency department.  Doxycycline can change your Coumadin levels to make sure you get them rechecked closely.  It is vital that you follow closely tomorrow with your  primary care doctor and with podiatry.

## 2017-05-19 NOTE — ED Triage Notes (Signed)
Patient presents to ED via POV from pcp office. Dr. Edwina Barth saw the patient in his office today where patients BP was 80/50. Patient states blood work was done and he was told his WBC was high. Patient states he does not feel well but is unable to state what is wrong. BP stable in triage. Even and non labored respirations noted.

## 2017-05-19 NOTE — ED Notes (Signed)
Resp count validated for 1830 is invalid and should be discarded.

## 2017-05-19 NOTE — ED Provider Notes (Addendum)
[facility] Emergency Department Provider Note  ____________________________________________   I have reviewed the triage vital signs and the nursing notes. Where available I have reviewed prior notes and, if possible and indicated, outside hospital notes.    HISTORY  Chief Complaint Abnormal Lab    HPI Tyler [name] is a 61 y.o. 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  He states he feels pretty much in his normal state of health.  He is at his doctor's however and a blood pressure was noted in the 80s and a white count was elevated so he was sent here.  He denies any fever chills nausea vomiting or diarrhea he denies any change in stool he denies any abdominal pain, he denies any chest pain or cough or shortness of breath, denies any dysuria urinary frequency, as noted he has had "for 1 year" redness to the great toe with some drainage from underneath the toe, that is not different today than any other day.  He denies any other rash.  He does not feel lightheaded.  He states he feels generally fatigued but this is not unusual for him and certainly does not seem to be apparent from normal.  Essentially he feels his normal self.  He did not feel lightheaded.  He is only had a cup of soup all day because he just has not had time to eat.  Sent to the emergency room because of the outpatient readings of blood pressure, is unclear who exactly perform the blood pressure measurements.  Blood work today does show that he is therapeutic on his INR at 3.3.  He denies any bright red blood per rectum he states he has had dark stool usually, for 1 year.  Past Medical History:  Diagnosis Date  . AICD (automatic cardioverter/defibrillator) present   . Atrial fibrillation ([place])   . CHF (congestive heart failure) ([place])   . COPD  (chronic obstructive pulmonary disease) ([place])   . Coronary artery disease   . Depression   . Dyspnea   . GERD (gastroesophageal reflux disease)   . Hypertension   . Myocardial infarction ([place])   . Peripheral vascular disease ([place])   . Presence of permanent cardiac pacemaker     Patient Active Problem List   Diagnosis Date Noted  . COPD with acute exacerbation ([place]) 10/13/2016  . Acute respiratory failure with hypoxia and hypercapnia ([place]) 10/13/2016  . Chronic systolic CHF (congestive heart failure) ([place]) 10/13/2016  . AF (paroxysmal atrial fibrillation) ([place]) 10/13/2016  . Hx of AKA (above knee amputation), right ([place]) 05/15/2016  . Essential hypertension, benign 05/15/2016  . PVD (peripheral vascular disease) ([place]) 05/15/2016    Past Surgical History:  Procedure Laterality Date  . ABOVE KNEE LEG AMPUTATION Right    after below the knee amputation   . BELOW KNEE LEG AMPUTATION Right   . CATARACT EXTRACTION, BILATERAL Bilateral   . IMPLANTABLE CARDIOVERTER DEFIBRILLATOR (ICD) GENERATOR CHANGE Left 02/10/2017   Procedure: ICD GENERATOR CHANGE;  Surgeon: Isaias Cowman, MD;  Location: [facility] ORS;  Service: Cardiovascular;  Laterality: Left;  . INSERT / REPLACE / REMOVE PACEMAKER    . PERIPHERAL VASCULAR CATHETERIZATION Left 12/09/2015   Procedure: Lower Extremity Angiography;  Surgeon: Algernon Huxley, MD;  Location: [place] [facility];  Service: Cardiovascular;  Laterality: Left;  . PERIPHERAL VASCULAR CATHETERIZATION  12/09/2015   Procedure: Lower Extremity Intervention;  Surgeon: Algernon Huxley, MD;  Location: [place] [facility];  Service: Cardiovascular;;  . TONSILLECTOMY      Prior to Admission medications   Medication Sig Start Date End Date Taking? Authorizing Provider  acetaminophen (TYLENOL) 500 MG tablet Take 1,000 mg by mouth daily as needed for moderate pain or headache.    [provider]  ALPRAZolam Duanne Moron) 1 MG tablet Take 1 mg by mouth at bedtime.     [provider]  aspirin EC 81 MG tablet Take 1 tablet (81 mg total) by mouth daily. 12/09/15   Algernon Huxley, MD  cephALEXin (KEFLEX) 250 MG capsule Take 1 capsule (250 mg total) by mouth 4 (four) times daily. 02/10/17   Paraschos, Alexander, MD  docusate sodium (COLACE) 100 MG capsule Take 100 mg by mouth every other day.    [provider]  fluticasone (FLONASE) 50 MCG/ACT nasal spray Place 2 sprays into both nostrils daily.    [provider]  fluticasone-salmeterol (ADVAIR HFA) 115-21 MCG/ACT inhaler USE 2 INHALATIONS ORALLY   EVERY 12 HOURS 12/21/16   Flora Lipps, MD  furosemide (LASIX) 20 MG tablet Take 10 mg by mouth every other day.    [provider]  ipratropium (ATROVENT) 0.03 % nasal spray Place 2 sprays into both nostrils every 12 (twelve) hours.    [provider]  Ipratropium-Albuterol (COMBIVENT RESPIMAT) 20-100 MCG/ACT AERS respimat Inhale 1 puff into the lungs every 4 (four) hours as needed for wheezing.    [provider]  levalbuterol (XOPENEX) 1.25 MG/3ML nebulizer solution Take 1.25 mg (3 mLs total) by nebulization every 4 (four) hours. Patient taking differently: Take 3 mLs by nebulization every 4 (four) hours as needed for wheezing or shortness of breath.  10/23/16   Theodoro Grist, MD  loratadine (CLARITIN) 10 MG tablet Take 10 mg by mouth at bedtime.    [provider]  lovastatin (MEVACOR) 20 MG tablet Take 20 mg by mouth at bedtime.    [provider]  metoprolol succinate (TOPROL-XL) 25 MG 24 hr tablet Take 25 mg by mouth daily.    [provider]  nystatin ointment (MYCOSTATIN) Apply 1 application topically daily as needed for rash. 12/10/16   [provider]  pantoprazole (PROTONIX) 40 MG tablet Take 40 mg by mouth daily.    [provider]  predniSONE (STERAPRED UNI-PAK 21 TAB) 10 MG (21) TBPK tablet As directed 02/24/17   Laverle Hobby, MD  predniSONE (STERAPRED  UNI-PAK 21 TAB) 10 MG (21) TBPK tablet Take as directed per taper 02/24/17   Laverle Hobby, MD  ramipril (ALTACE) 10 MG capsule Take 10 mg by mouth at bedtime.     [provider]  simethicone (MYLICON) 80 MG chewable tablet Chew 1 tablet (80 mg total) by mouth 4 (four) times daily. Patient taking differently: Chew 80 mg by mouth 2 (two) times daily as needed for flatulence.  10/23/16   Theodoro Grist, MD  Tiotropium Bromide Monohydrate (SPIRIVA RESPIMAT) 1.25 MCG/ACT AERS Inhale 2 puffs into the lungs daily. 02/01/17   Flora Lipps, MD  warfarin (COUMADIN) 5 MG tablet Take 5 mg by mouth every evening.     [provider]    Allergies Apixaban and Rivaroxaban  Family History  Problem Relation Age of Onset  . Cancer Mother   . Cancer Father   . Heart disease Father     Social History Social History   Tobacco  Use  . Smoking status: Former Smoker    Packs/day: 1.00    Years: 42.00    Pack years: 42.00    Types: Cigarettes    Last attempt to quit: 09/23/2013    Years since quitting: 3.6  . Smokeless tobacco: Never Used  Substance Use Topics  . Alcohol use: No  . Drug use: No    Review of Systems Constitutional: No fever/chills Eyes: No visual changes. ENT: No sore throat. No stiff neck no neck pain Cardiovascular: Denies chest pain. Respiratory: Denies shortness of breath. Gastrointestinal:   no vomiting.  No diarrhea.  No constipation. Genitourinary: Negative for dysuria. Musculoskeletal: Negative lower extremity swelling Skin: Negative for rash. Neurological: Negative for severe headaches, focal weakness or numbness.   ____________________________________________   PHYSICAL EXAM:  VITAL SIGNS: ED Triage Vitals  Enc Vitals Group     BP 05/19/17 1557 130/64     Pulse Rate 05/19/17 1557 84     Resp 05/19/17 1557 16     Temp 05/19/17 1557 98.6 F (37 C)     Temp src --      SpO2 05/19/17 1557 100 %     Weight 05/19/17 1558 150 lb (68 kg)      Height 05/19/17 1558 6\' 1"  (1.854 m)     Head Circumference --      Peak Flow --      Pain Score --      Pain Loc --      Pain Edu? --      Excl. in [place]? --     Constitutional: Alert and oriented. Well appearing and in no acute distress. Eyes: Conjunctivae are normal Head: Atraumatic HEENT: No congestion/rhinnorhea. Mucous membranes are moist.  Oropharynx non-erythematous Neck:   Nontender with no meningismus, no masses, no stridor Cardiovascular: Normal rate, regular rhythm. Grossly normal heart sounds.  Good peripheral circulation. Respiratory: Normal respiratory effort.  No retractions. Lungs CTAB. Abdominal: Soft and nontender. No distention. No guarding no rebound Back:  There is no focal tenderness or step off.  there is no midline tenderness there are no lesions noted. there is no CVA tenderness Musculoskeletal: No lower extremity tenderness, no upper extremity tenderness. No joint effusions, no DVT signs strong distal pulses no edema Neurologic:  Normal speech and language. No gross focal neurologic deficits are appreciated.  Skin:  Skin is warm, dry and intact.  Mild erythema noted to the great toe on the left, AK on the right.  There is no weeks from the toe the foot itself is not swollen and markedly tender, a faint pulses are appreciated through Doppler, the foot is not cold, the toe is not hot. Psychiatric: Mood and affect are normal. Speech and behavior are normal.  ____________________________________________   LABS (all labs ordered are listed, but only abnormal results are displayed)  Labs Reviewed  COMPREHENSIVE METABOLIC PANEL - Abnormal; Notable for the following components:      Result Value   Sodium 131 (*)    Chloride 94 (*)    Glucose, Bld 108 (*)    All other components within normal limits  CBC WITH DIFFERENTIAL/PLATELET - Abnormal; Notable for the following components:   WBC 13.1 (*)    RDW 14.7 (*)    Neutro Abs 11.7 (*)    Lymphs Abs 0.8 (*)     All other components within normal limits  URINALYSIS, COMPLETE (UACMP) WITH MICROSCOPIC - Abnormal; Notable for the following components:   Color, Urine YELLOW (*)  APPearance CLEAR (*)    Squamous Epithelial / LPF 0-5 (*)    All other components within normal limits  CULTURE, BLOOD (ROUTINE X 2)  CULTURE, BLOOD (ROUTINE X 2)  LACTIC ACID, PLASMA  LACTIC ACID, PLASMA    Pertinent labs  results that were available during my care of the patient were reviewed by me and considered in my medical decision making (see chart for details). ____________________________________________  EKG  I personally interpreted any EKGs ordered by me or triage  ____________________________________________  RADIOLOGY  Pertinent labs & imaging results that were available during my care of the patient were reviewed by me and considered in my medical decision making (see chart for details). If possible, patient and/or family made aware of any abnormal findings.  Dg Chest 2 View  Result Date: 05/19/2017 CLINICAL DATA:  Elevated white count, low blood pressure. Not feeling well. EXAM: CHEST  2 VIEW COMPARISON:  Chest x-ray dated 10/21/2016. FINDINGS: Heart size and mediastinal contours are stable. Left chest wall pacemaker/ ICD hardware appears stable in configuration. Lungs are clear. No pleural effusion or pneumothorax seen. No acute or suspicious osseous finding. IMPRESSION: No active cardiopulmonary disease. No evidence of pneumonia or pulmonary edema. Electronically Signed   By: Franki Cabot M.D.   On: 05/19/2017 19:20   Dg Foot Complete Left  Result Date: 05/19/2017 CLINICAL DATA:  Pain, swelling and redness of left great toe. No injury. EXAM: LEFT FOOT - COMPLETE 3+ VIEW COMPARISON:  None. FINDINGS: Osseous alignment is normal. No acute or suspicious osseous finding. No fracture line or displaced fracture fragment. No significant degenerative change. Vascular calcifications throughout the soft tissues  about the left foot. No acute appearing soft tissue abnormality identified. IMPRESSION: *No acute findings. *Atherosclerosis. Electronically Signed   By: Franki Cabot M.D.   On: 05/19/2017 19:22   ____________________________________________    PROCEDURES  Procedure(s) performed: None  Procedures  Critical Care performed: None  ____________________________________________   INITIAL IMPRESSION / ASSESSMENT AND PLAN / ED COURSE  Pertinent labs & imaging results that were available during my care of the patient were reviewed by me and considered in my medical decision making (see chart for details).  Patient here with nonspecific low blood pressure as recorded in the office as an outpatient his blood pressures in 130s-150s here however he does not appear to be septic.  Nonetheless we did check diffuse workup, no evidence of osteomyelitis is noted, no evidence of GI bleed hemoglobin is stable about a year of what he describes a somewhat dark stools, we will perform a guaiac if he lets me.No evidence of sepsis lactic is negative blood pressure is negative not tachycardic, chest x-ray shows no pneumonia, in short, there is no acute pathology noted on her blood work and vital signs are reassuring.  Unclear why he had a low blood pressure at the doctor's office could be that there was a erroneous reading or a large cuff etc.  I will check the patient's rectal exam and if he is guaiac negative is my hope that we can get him safely home with close outpatient follow-up with podiatry for his chronically infected toe.  Certainly nothing about this indicates a septic reason for patient to have low blood pressure. There is no evidence of low blood pressure her blood pressure and I was in the 160s  ----------------------------------------- 8:30 PM on 05/19/2017 -----------------------------------------  She is guaiac negative from below no evidence of GI bleeding, he has been observed here with no  evidence of pathology of any daily, I will start him on doxycycline for his toe, and we will give him extensive return precautions and follow-up I have instructed him to see his doctor tomorrow without fail.  Patient very comfortable with this he declines admission and is eager to go  ----------------------------------------- 8:44 PM on 05/19/2017 -----------------------------------------  Repeat lactic is borderline. This was drawn with a tourniquet . We will redraw.  Very nonspecific finding. We will recheck to ensure it is the actual value and not going up.; if reassuring will d.c. Signed out to dr. Quentin Cornwall at the end of my shift. RR is 19, not in the 20s, when actually counted. There is artifact on pulseox limiting eval. The patient is not having breathing difficulties.     ____________________________________________   FINAL CLINICAL IMPRESSION(S) / ED DIAGNOSES  Final diagnoses:  None      This chart was dictated using voice recognition software.  Despite best efforts to proofread,  errors can occur which can change meaning.      Schuyler Amor, MD 05/19/17 2031    Schuyler Amor, MD 05/19/17 2045    Schuyler Amor, MD 05/19/17 [phone]

## 2017-05-21 ENCOUNTER — Other Ambulatory Visit (INDEPENDENT_AMBULATORY_CARE_PROVIDER_SITE_OTHER): Payer: Self-pay | Admitting: Vascular Surgery

## 2017-05-24 LAB — CULTURE, BLOOD (ROUTINE X 2)
CULTURE: NO GROWTH
CULTURE: NO GROWTH
SPECIAL REQUESTS: ADEQUATE

## 2017-05-28 ENCOUNTER — Other Ambulatory Visit: Payer: Self-pay | Admitting: Student

## 2017-05-28 DIAGNOSIS — G959 Disease of spinal cord, unspecified: Secondary | ICD-10-CM

## 2017-05-28 DIAGNOSIS — M5000 Cervical disc disorder with myelopathy, unspecified cervical region: Secondary | ICD-10-CM | POA: Insufficient documentation

## 2017-05-31 ENCOUNTER — Ambulatory Visit (INDEPENDENT_AMBULATORY_CARE_PROVIDER_SITE_OTHER): Payer: 59 | Admitting: Vascular Surgery

## 2017-06-01 ENCOUNTER — Other Ambulatory Visit: Payer: Self-pay | Admitting: Student

## 2017-06-01 DIAGNOSIS — M542 Cervicalgia: Secondary | ICD-10-CM

## 2017-06-04 ENCOUNTER — Inpatient Hospital Stay: Admission: RE | Admit: 2017-06-04 | Payer: Medicare Other | Source: Ambulatory Visit

## 2017-06-06 MED ORDER — CEFAZOLIN SODIUM-DEXTROSE 2-4 GM/100ML-% IV SOLN
2.0000 g | Freq: Once | INTRAVENOUS | Status: AC
  Administered 2017-06-07: 2 g via INTRAVENOUS

## 2017-06-07 ENCOUNTER — Ambulatory Visit
Admission: RE | Admit: 2017-06-07 | Discharge: 2017-06-07 | Disposition: A | Discharge status: Home / Self Care | Payer: 59 | Source: Ambulatory Visit | Attending: Vascular Surgery | Admitting: Vascular Surgery

## 2017-06-07 ENCOUNTER — Encounter
Admission: RE | Disposition: A | Discharge status: Home / Self Care | Payer: Self-pay | Source: Ambulatory Visit | Attending: Vascular Surgery

## 2017-06-07 ENCOUNTER — Encounter: Payer: Self-pay | Admitting: *Deleted

## 2017-06-07 DIAGNOSIS — Z9889 Other specified postprocedural states: Secondary | ICD-10-CM | POA: Diagnosis not present

## 2017-06-07 DIAGNOSIS — J449 Chronic obstructive pulmonary disease, unspecified: Secondary | ICD-10-CM | POA: Insufficient documentation

## 2017-06-07 DIAGNOSIS — I255 Ischemic cardiomyopathy: Secondary | ICD-10-CM | POA: Diagnosis not present

## 2017-06-07 DIAGNOSIS — Z95810 Presence of automatic (implantable) cardiac defibrillator: Secondary | ICD-10-CM | POA: Insufficient documentation

## 2017-06-07 DIAGNOSIS — Z888 Allergy status to other drugs, medicaments and biological substances status: Secondary | ICD-10-CM | POA: Insufficient documentation

## 2017-06-07 DIAGNOSIS — Z8249 Family history of ischemic heart disease and other diseases of the circulatory system: Secondary | ICD-10-CM | POA: Insufficient documentation

## 2017-06-07 DIAGNOSIS — I509 Heart failure, unspecified: Secondary | ICD-10-CM | POA: Insufficient documentation

## 2017-06-07 DIAGNOSIS — K219 Gastro-esophageal reflux disease without esophagitis: Secondary | ICD-10-CM | POA: Insufficient documentation

## 2017-06-07 DIAGNOSIS — Z87891 Personal history of nicotine dependence: Secondary | ICD-10-CM | POA: Insufficient documentation

## 2017-06-07 DIAGNOSIS — I70222 Atherosclerosis of native arteries of extremities with rest pain, left leg: Secondary | ICD-10-CM | POA: Insufficient documentation

## 2017-06-07 DIAGNOSIS — Z9842 Cataract extraction status, left eye: Secondary | ICD-10-CM | POA: Diagnosis not present

## 2017-06-07 DIAGNOSIS — Z89611 Acquired absence of right leg above knee: Secondary | ICD-10-CM | POA: Diagnosis not present

## 2017-06-07 DIAGNOSIS — Z809 Family history of malignant neoplasm, unspecified: Secondary | ICD-10-CM | POA: Insufficient documentation

## 2017-06-07 DIAGNOSIS — I480 Paroxysmal atrial fibrillation: Secondary | ICD-10-CM | POA: Diagnosis not present

## 2017-06-07 DIAGNOSIS — Z79899 Other long term (current) drug therapy: Secondary | ICD-10-CM | POA: Insufficient documentation

## 2017-06-07 DIAGNOSIS — Z7902 Long term (current) use of antithrombotics/antiplatelets: Secondary | ICD-10-CM | POA: Insufficient documentation

## 2017-06-07 DIAGNOSIS — I110 Hypertensive heart disease with heart failure: Secondary | ICD-10-CM | POA: Diagnosis not present

## 2017-06-07 DIAGNOSIS — I70212 Atherosclerosis of native arteries of extremities with intermittent claudication, left leg: Secondary | ICD-10-CM

## 2017-06-07 DIAGNOSIS — Z9841 Cataract extraction status, right eye: Secondary | ICD-10-CM | POA: Diagnosis not present

## 2017-06-07 DIAGNOSIS — Z7982 Long term (current) use of aspirin: Secondary | ICD-10-CM | POA: Diagnosis not present

## 2017-06-07 HISTORY — PX: LOWER EXTREMITY ANGIOGRAPHY: CATH118251

## 2017-06-07 HISTORY — PX: LOWER EXTREMITY INTERVENTION: CATH118252

## 2017-06-07 LAB — BUN: BUN: 12 mg/dL (ref 6–20)

## 2017-06-07 LAB — CREATININE, SERUM
CREATININE: 0.89 mg/dL (ref 0.61–1.24)
GFR calc Af Amer: >60 mL/min (ref >60)

## 2017-06-07 LAB — PROTIME-INR
INR: 1.2
Prothrombin Time: 15.1 seconds (ref 11.4–15.2)

## 2017-06-07 SURGERY — LOWER EXTREMITY ANGIOGRAPHY
Anesthesia: Moderate Sedation | Laterality: Left

## 2017-06-07 MED ORDER — ONDANSETRON HCL 4 MG/2ML IJ SOLN
4.0000 mg | Freq: Four times a day (QID) | INTRAMUSCULAR | Status: DC | PRN
  Administered 2017-06-07: 4 mg via INTRAVENOUS

## 2017-06-07 MED ORDER — HYDRALAZINE HCL 20 MG/ML IJ SOLN: 5.0000 mg | INTRAMUSCULAR | Status: DC | PRN

## 2017-06-07 MED ORDER — MIDAZOLAM HCL 5 MG/5ML IJ SOLN
INTRAMUSCULAR | Status: AC
  Filled 2017-06-07: qty 10

## 2017-06-07 MED ORDER — FENTANYL CITRATE (PF) 100 MCG/2ML IJ SOLN
INTRAMUSCULAR | Status: AC
  Filled 2017-06-07: qty 4

## 2017-06-07 MED ORDER — IOPAMIDOL (ISOVUE-300) INJECTION 61%
INTRAVENOUS | Status: DC | PRN
  Administered 2017-06-07: 50 mL via INTRAVENOUS

## 2017-06-07 MED ORDER — HEPARIN (PORCINE) IN NACL 2-0.9 UNIT/ML-% IJ SOLN
INTRAMUSCULAR | Status: AC
  Filled 2017-06-07: qty 1000

## 2017-06-07 MED ORDER — METHYLPREDNISOLONE SODIUM SUCC 125 MG IJ SOLR: 125.0000 mg | INTRAMUSCULAR | Status: DC | PRN

## 2017-06-07 MED ORDER — LABETALOL HCL 5 MG/ML IV SOLN: 10.0000 mg | INTRAVENOUS | Status: DC | PRN

## 2017-06-07 MED ORDER — SODIUM CHLORIDE 0.9% FLUSH: 3.0000 mL | INTRAVENOUS | Status: DC | PRN

## 2017-06-07 MED ORDER — LIDOCAINE-EPINEPHRINE (PF) 1 %-1:200000 IJ SOLN
INTRAMUSCULAR | Status: AC
  Filled 2017-06-07: qty 30

## 2017-06-07 MED ORDER — HYDROMORPHONE HCL 1 MG/ML IJ SOLN
1.0000 mg | Freq: Once | INTRAMUSCULAR | Status: AC | PRN
  Administered 2017-06-07: 0.5 mg via INTRAVENOUS

## 2017-06-07 MED ORDER — HYDROMORPHONE HCL 1 MG/ML IJ SOLN
INTRAMUSCULAR | Status: AC
  Filled 2017-06-07: qty 0.5

## 2017-06-07 MED ORDER — SODIUM CHLORIDE 0.9 % IV SOLN: 250.0000 mL | INTRAVENOUS | Status: DC | PRN

## 2017-06-07 MED ORDER — HEPARIN SODIUM (PORCINE) 1000 UNIT/ML IJ SOLN
INTRAMUSCULAR | Status: AC
  Filled 2017-06-07: qty 1

## 2017-06-07 MED ORDER — FENTANYL CITRATE (PF) 100 MCG/2ML IJ SOLN
INTRAMUSCULAR | Status: DC | PRN
  Administered 2017-06-07 (×2): 50 ug via INTRAVENOUS
  Administered 2017-06-07 (×2): 25 ug via INTRAVENOUS

## 2017-06-07 MED ORDER — SODIUM CHLORIDE 0.9% FLUSH: 3.0000 mL | Freq: Two times a day (BID) | INTRAVENOUS | Status: DC

## 2017-06-07 MED ORDER — ONDANSETRON HCL 4 MG/2ML IJ SOLN
INTRAMUSCULAR | Status: AC
  Filled 2017-06-07: qty 2

## 2017-06-07 MED ORDER — SODIUM CHLORIDE 0.9 % IV SOLN: INTRAVENOUS | Status: DC

## 2017-06-07 MED ORDER — FAMOTIDINE 20 MG PO TABS: 40.0000 mg | ORAL_TABLET | ORAL | Status: DC | PRN

## 2017-06-07 MED ORDER — CEFAZOLIN SODIUM-DEXTROSE 2-4 GM/100ML-% IV SOLN
INTRAVENOUS | Status: AC
  Filled 2017-06-07: qty 100

## 2017-06-07 MED ORDER — MIDAZOLAM HCL 2 MG/2ML IJ SOLN
INTRAMUSCULAR | Status: DC | PRN
  Administered 2017-06-07: 1 mg via INTRAVENOUS
  Administered 2017-06-07: 2 mg via INTRAVENOUS
  Administered 2017-06-07 (×4): 1 mg via INTRAVENOUS

## 2017-06-07 MED ORDER — SODIUM CHLORIDE 0.9 % IV SOLN
INTRAVENOUS | Status: DC
  Administered 2017-06-07: 07:00:00 via INTRAVENOUS

## 2017-06-07 MED ORDER — HEPARIN SODIUM (PORCINE) 1000 UNIT/ML IJ SOLN
INTRAMUSCULAR | Status: DC | PRN
  Administered 2017-06-07: 3500 [IU] via INTRAVENOUS

## 2017-06-07 SURGICAL SUPPLY — 18 items
BALLN LUTONIX 5X220X130 (BALLOONS) ×4
BALLN ULTRVRSE 018 2.5X100X150 (BALLOONS) ×4
BALLN ULTRVRSE 5X80X130C (BALLOONS) ×4
BALLOON LUTONIX 5X220X130 (BALLOONS) ×2 IMPLANT
BALLOON ULTRVRSE 5X80X130C (BALLOONS) ×2 IMPLANT
BALLOON ULTRVS 018 2.5X100X150 (BALLOONS) ×2 IMPLANT
CANNULA 5F STIFF (CANNULA) ×4 IMPLANT
CATH BEACON 5 .035 65 KMP TIP (CATHETERS) ×4 IMPLANT
CATH PIG 70CM (CATHETERS) ×4 IMPLANT
COVER PROBE U/S 5X48 (MISCELLANEOUS) ×4 IMPLANT
DEVICE PRESTO INFLATION (MISCELLANEOUS) ×4 IMPLANT
DRAPE BRACHIAL (DRAPES) ×4 IMPLANT
PACK ANGIOGRAPHY (CUSTOM PROCEDURE TRAY) ×4 IMPLANT
SHEATH BRITE TIP 5FRX11 (SHEATH) ×4 IMPLANT
STENT LIFESTENT 5F 6X60X135 (Permanent Stent) ×4 IMPLANT
TUBING CONTRAST HIGH PRESS 72 (TUBING) ×4 IMPLANT
WIRE G V18X300CM (WIRE) ×4 IMPLANT
WIRE J 3MM .035X145CM (WIRE) ×4 IMPLANT

## 2017-06-07 NOTE — OR Nursing (Signed)
Nauseated, zofran given IV

## 2017-06-07 NOTE — H&P (Signed)
[facility] History & Physical Update  The patient was interviewed and re-examined.  The patient's previous History and Physical has been reviewed and is unchanged.  There is no change in the plan of care. We plan to proceed with the scheduled procedure.  Jason [name], MD  03/28/2018, 8:11 AM    

## 2017-06-07 NOTE — OR Nursing (Signed)
Sitting up eating crackers and ginger ale. Since pt has bleed with past angiograms continue to closely monitor groin. Nausea and back and right hip pain slightly better with head up.

## 2017-06-07 NOTE — Op Note (Signed)
[facility] Percutaneous Study/Intervention Procedural Note   Date of Surgery: 06/07/2017  Surgeon(s):[name],JASON   Assistants:none  Pre-operative Diagnosis: PAD with rest [name] left lower extremity, status post right above-knee amputation  Post-operative diagnosis: Same  Procedure(s) Performed: 1. Ultrasound guidance for vascular access left femoral artery 2. Catheter placement into left anterior tibial artery from left femoral approach 3. Selective left lower extremity angiogram 4. Percutaneous transluminal angioplasty of left SFA with 5 mm diameter by 22 cm length Lutonix drug-coated angioplasty balloon 5. Percutaneous transluminal angioplasty of the left anterior tibial artery with 2.5 mm diameter by 10 cm length angioplasty balloon  6.  Stent placement to the distal SFA with 6 mm diameter by 6 cm length stent for greater than 50% residual stenosis after angioplasty just below the previously placed stent   EBL: 10 cc  Contrast: 50 cc  Fluoro Time: 5.1 minutes  Moderate Conscious Sedation Time: approximately 40 minutes using 7 mg of Versed and 150 Mcg of Fentanyl  Indications: Patient is a 62 y.o.male with worsening [name] of the left leg now hurting all the time and a known history of peripheral arterial disease with multiple previous interventions.  He has already lost his right leg. The patient is brought in for angiography for further evaluation and potential treatment.  Due to the limb threatening nature of the situation, angiogram was performed for attempted limb salvage.  Risks and benefits are discussed and informed consent is obtained  Procedure: The patient was identified and appropriate procedural time out was performed. The patient was then placed supine on the table and prepped and draped in the usual sterile fashion.Moderate conscious sedation was  administered during a face to face encounter with the patient throughout the procedure with my supervision of the RN administering medicines and monitoring the patient's vital signs, pulse oximetry, telemetry and mental status throughout from the start of the procedure until the patient was taken to the recovery room. Ultrasound was used to evaluate the right common femoral artery.  This was chronically occluded and we had previously had to perform an antegrade intervention, so I decided not to try to treat this from the right side and just perform an antegrade intervention from the left.  The left femoral artery was visualized with ultrasound.  It was heavily diseased but did have flow in it. A digital ultrasound image was acquired. A micropuncture needle was used to access the proximal left common femoral artery under direct ultrasound guidance in an antegrade fashion and a permanent image was performed. A micropuncture wire and sheath were then placed and advanced down into the proximal SFA.  A 0.035 J wire was advanced without resistance and a 5Fr sheath was placed. Selective left lower extremity angiogram was then performed. This demonstrated significant disease at the common femoral bifurcation that would not be amenable to percutaneous treatment today.  This extended into the proximal SFA for a centimeter or 2 in the first centimeter of the profunda femoris artery as well.  All 3 vessels appear to have a greater than 70% stenosis.  The SFA then normalized for several centimeters but had 70-75% narrowing above and at the leading edge of the previously placed stent and another narrowing in the 70% right just below the previously placed stent in the distal SFA.  The vessel then normalized and had a normal the posterior tibial artery was the dominant runoff to the foot without focal stenosis.  The anterior tibial artery had a short segment near  occlusive stenosis in the mid to distal segment.  The peroneal  artery was small and chronically occluded.  The patient was systemically heparinized and a 0.018 wire and a Kumpe catheter were used to navigate through the SFA stenoses and down into the tibial trifurcation.  I then advanced the Kumpe catheter and 0.018 wire into the tibial artery and cross this area of stenosis parking the wire in the foot.  The anterior tibial artery was treated with a 2.5 mm diameter by 10 cm length in the mid to distal anterior tibial artery.  This was inflated to 10 atm for 1 minute.  The superficial femoral artery was then treated with a 5 mm diameter by 22 cm length Lutonix drug-coated angioplasty balloon to encompass the lesions above and below the previously placed stent.  This was inflated to 10 atm for 1 minute.  Completion angiogram showed significant spasm in the anterior tibial artery but his blood pressure was prohibitive to give nitroglycerin to remove the spasm.  Completion angiogram really could not tell how much improvement was seen from the area of near occlusive stenosis although this seem to be resolved with spasm vessel.  The superficial femoral artery above the leading edge of the previously placed stent now had less than 20% residual stenosis, but the area just below the previously placed stent still had a greater than 50% stenosis and dissection after angioplasty.  I elected to cover this area with a stent.  A 6 mm diameter by 6 cm length stent was deployed in the distal superficial femoral artery and dilated with a 5 mm balloon with excellent angiographic completion result and less than 10% residual stenosis. I elected to terminate the procedure. The sheath was removed and Rusher was held in the left femoral artery with excellent hemostatic result. The patient was taken to the recovery room in stable condition having tolerated the procedure well.  Findings:   Left Lower Extremity: This demonstrated significant disease at the common femoral  bifurcation that would not be amenable to percutaneous treatment today.  This extended into the proximal SFA for a centimeter or 2 in the first centimeter of the profunda femoris artery as well.  All 3 vessels appear to have a greater than 70% stenosis.  The SFA then normalized for several centimeters but had 70-75% narrowing above and at the leading edge of the previously placed stent and another narrowing in the 70% right just below the previously placed stent in the distal SFA.  The vessel then normalized and had a normal the posterior tibial artery was the dominant runoff to the foot without focal stenosis.  The anterior tibial artery had a short segment near occlusive stenosis in the mid to distal segment.  The peroneal artery was small and chronically occluded.   Disposition: Patient was taken to the recovery room in stable condition having tolerated the procedure well.  Complications: None  Tyler [name] 06/07/2017 9:22 AM   This note was created with Dragon Medical transcription system. Any errors in dictation are purely unintentional.

## 2017-06-07 NOTE — Discharge Instructions (Signed)

## 2017-06-10 ENCOUNTER — Other Ambulatory Visit: Payer: Medicare Other

## 2017-06-10 ENCOUNTER — Ambulatory Visit
Admission: RE | Admit: 2017-06-10 | Discharge: 2017-06-10 | Disposition: A | Discharge status: Home / Self Care | Payer: 59 | Source: Ambulatory Visit | Attending: Student | Admitting: Student

## 2017-06-10 VITALS — BP 122/71 | HR 80

## 2017-06-10 DIAGNOSIS — M542 Cervicalgia: Secondary | ICD-10-CM

## 2017-06-10 DIAGNOSIS — M5000 Cervical disc disorder with myelopathy, unspecified cervical region: Secondary | ICD-10-CM

## 2017-06-10 MED ORDER — ONDANSETRON HCL 4 MG/2ML IJ SOLN
4.0000 mg | Freq: Once | INTRAMUSCULAR | Status: AC
  Administered 2017-06-10: 4 mg via INTRAMUSCULAR

## 2017-06-10 MED ORDER — DIAZEPAM 5 MG PO TABS
10.0000 mg | ORAL_TABLET | Freq: Once | ORAL | Status: AC
  Administered 2017-06-10: 10 mg via ORAL

## 2017-06-10 MED ORDER — IOPAMIDOL (ISOVUE-M 300) INJECTION 61%
15.0000 mL | Freq: Once | INTRAMUSCULAR | Status: AC | PRN
  Administered 2017-06-10: 15 mL via INTRATHECAL

## 2017-06-10 MED ORDER — MEPERIDINE HCL 100 MG/ML IJ SOLN
75.0000 mg | Freq: Once | INTRAMUSCULAR | Status: AC
  Administered 2017-06-10: 75 mg via INTRAMUSCULAR

## 2017-06-10 NOTE — Discharge Instructions (Signed)
Myelogram Discharge Instructions  1. Go home and rest quietly for the next 24 hours.  It is important to lie flat for the next 24 hours.  Get up only to go to the restroom.  You may lie in the bed or on a couch on your back, your stomach, your left side or your right side.  You may have one pillow under your head.  You may have pillows between your knees while you are on your side or under your knees while you are on your back.  2. DO NOT drive today.  Recline the seat as far back as it will go, while still wearing your seat belt, on the way home.  3. You may get up to go to the bathroom as needed.  You may sit up for 10 minutes to eat.  You may resume your normal diet and medications unless otherwise indicated.  Drink lots of extra fluids today and tomorrow.  4. The incidence of headache, nausea, or vomiting is about 5% (one in 20 patients).  If you develop a headache, lie flat and drink plenty of fluids until the headache goes away.  Caffeinated beverages may be helpful.  If you develop severe nausea and vomiting or a headache that does not go away with flat bed rest, call [phone].  5. You may resume normal activities after your 24 hours of bed rest is over; however, do not exert yourself strongly or do any heavy lifting tomorrow. If when you get up you have a headache when standing, go back to bed and force fluids for another 24 hours.  6. Call your physician for a follow-up appointment.  The results of your myelogram will be sent directly to your physician by the following day.  7. If you have any questions or if complications develop after you arrive home, please call [phone].  Discharge instructions have been explained to the patient.  The patient, or the person responsible for the patient, fully understands these instructions  YOU MAY RESTART YOUR WARFARIN (COUMADIN) TODAY 06/10/2017.

## 2017-06-10 NOTE — Progress Notes (Signed)
Patient states he had an INR done yesterday which was 1.0.  His last Lovenox injection was last night @ 1800. Brita Romp, RN

## 2017-06-14 ENCOUNTER — Other Ambulatory Visit: Payer: Self-pay | Admitting: Internal Medicine

## 2017-06-14 MED ORDER — FLUTICASONE-SALMETEROL 115-21 MCG/ACT IN AERO: INHALATION_SPRAY | RESPIRATORY_TRACT | 5 refills | Status: DC

## 2017-06-22 ENCOUNTER — Other Ambulatory Visit: Payer: Self-pay

## 2017-06-22 ENCOUNTER — Inpatient Hospital Stay
Admission: EM | Admit: 2017-06-22 | Discharge: 2017-06-25 | DRG: 193 | Disposition: A | Discharge status: Home / Self Care | Payer: 59 | Attending: Internal Medicine | Admitting: Internal Medicine

## 2017-06-22 ENCOUNTER — Ambulatory Visit (INDEPENDENT_AMBULATORY_CARE_PROVIDER_SITE_OTHER): Payer: 59 | Admitting: Vascular Surgery

## 2017-06-22 ENCOUNTER — Emergency Department: Payer: 59

## 2017-06-22 DIAGNOSIS — I739 Peripheral vascular disease, unspecified: Secondary | ICD-10-CM | POA: Diagnosis present

## 2017-06-22 DIAGNOSIS — Z95810 Presence of automatic (implantable) cardiac defibrillator: Secondary | ICD-10-CM

## 2017-06-22 DIAGNOSIS — J9621 Acute and chronic respiratory failure with hypoxia: Secondary | ICD-10-CM | POA: Diagnosis present

## 2017-06-22 DIAGNOSIS — J101 Influenza due to other identified influenza virus with other respiratory manifestations: Secondary | ICD-10-CM | POA: Diagnosis present

## 2017-06-22 DIAGNOSIS — Z9981 Dependence on supplemental oxygen: Secondary | ICD-10-CM

## 2017-06-22 DIAGNOSIS — Z79899 Other long term (current) drug therapy: Secondary | ICD-10-CM

## 2017-06-22 DIAGNOSIS — M4802 Spinal stenosis, cervical region: Secondary | ICD-10-CM | POA: Diagnosis present

## 2017-06-22 DIAGNOSIS — I2510 Atherosclerotic heart disease of native coronary artery without angina pectoris: Secondary | ICD-10-CM | POA: Diagnosis present

## 2017-06-22 DIAGNOSIS — J441 Chronic obstructive pulmonary disease with (acute) exacerbation: Secondary | ICD-10-CM | POA: Diagnosis present

## 2017-06-22 DIAGNOSIS — J440 Chronic obstructive pulmonary disease with acute lower respiratory infection: Secondary | ICD-10-CM | POA: Diagnosis present

## 2017-06-22 DIAGNOSIS — Z89611 Acquired absence of right leg above knee: Secondary | ICD-10-CM | POA: Diagnosis not present

## 2017-06-22 DIAGNOSIS — Z888 Allergy status to other drugs, medicaments and biological substances status: Secondary | ICD-10-CM | POA: Diagnosis not present

## 2017-06-22 DIAGNOSIS — I110 Hypertensive heart disease with heart failure: Secondary | ICD-10-CM | POA: Diagnosis present

## 2017-06-22 DIAGNOSIS — F329 Major depressive disorder, single episode, unspecified: Secondary | ICD-10-CM | POA: Diagnosis present

## 2017-06-22 DIAGNOSIS — I252 Old myocardial infarction: Secondary | ICD-10-CM | POA: Diagnosis not present

## 2017-06-22 DIAGNOSIS — Z7901 Long term (current) use of anticoagulants: Secondary | ICD-10-CM

## 2017-06-22 DIAGNOSIS — Z95820 Peripheral vascular angioplasty status with implants and grafts: Secondary | ICD-10-CM

## 2017-06-22 DIAGNOSIS — D6851 Activated protein C resistance: Secondary | ICD-10-CM | POA: Diagnosis present

## 2017-06-22 DIAGNOSIS — Z7982 Long term (current) use of aspirin: Secondary | ICD-10-CM | POA: Diagnosis not present

## 2017-06-22 DIAGNOSIS — J209 Acute bronchitis, unspecified: Secondary | ICD-10-CM | POA: Diagnosis present

## 2017-06-22 DIAGNOSIS — K219 Gastro-esophageal reflux disease without esophagitis: Secondary | ICD-10-CM | POA: Diagnosis present

## 2017-06-22 DIAGNOSIS — J449 Chronic obstructive pulmonary disease, unspecified: Secondary | ICD-10-CM | POA: Diagnosis present

## 2017-06-22 DIAGNOSIS — I5022 Chronic systolic (congestive) heart failure: Secondary | ICD-10-CM | POA: Diagnosis present

## 2017-06-22 DIAGNOSIS — I480 Paroxysmal atrial fibrillation: Secondary | ICD-10-CM | POA: Diagnosis present

## 2017-06-22 DIAGNOSIS — Z87891 Personal history of nicotine dependence: Secondary | ICD-10-CM

## 2017-06-22 LAB — CBC WITH DIFFERENTIAL/PLATELET
Basophils Absolute: 0.1 10*3/uL (ref 0–0.1)
Basophils Relative: 1 %
EOS PCT: 0 %
Eosinophils Absolute: 0 10*3/uL (ref 0–0.7)
HCT: 39 % — ABNORMAL LOW (ref 40.0–52.0)
Hemoglobin: 13 g/dL (ref 13.0–18.0)
LYMPHS ABS: 0.5 10*3/uL — AB (ref 1.0–3.6)
LYMPHS PCT: 4 %
MCH: 29.3 pg (ref 26.0–34.0)
MCHC: 33.2 g/dL (ref 32.0–36.0)
MCV: 88.3 fL (ref 80.0–100.0)
MONOS PCT: 7 %
Monocytes Absolute: 0.9 10*3/uL (ref 0.2–1.0)
Neutro Abs: 12.2 10*3/uL — ABNORMAL HIGH (ref 1.4–6.5)
Neutrophils Relative %: 88 %
Platelets: 308 10*3/uL (ref 150–440)
RBC: 4.42 MIL/uL (ref 4.40–5.90)
RDW: 14.7 % — ABNORMAL HIGH (ref 11.5–14.5)
WBC: 13.7 10*3/uL — ABNORMAL HIGH (ref 3.8–10.6)

## 2017-06-22 LAB — EXPECTORATED SPUTUM ASSESSMENT W REFEX TO RESP CULTURE: SPECIAL REQUESTS: NORMAL

## 2017-06-22 LAB — PROTIME-INR
INR: 2.07
PROTHROMBIN TIME: 23.1 s — AB (ref 11.4–15.2)

## 2017-06-22 LAB — TROPONIN I: Troponin I: <0.03 ng/mL (ref <0.03)

## 2017-06-22 LAB — BASIC METABOLIC PANEL
Anion gap: 9 (ref 5–15)
BUN: 13 mg/dL (ref 6–20)
CHLORIDE: 93 mmol/L — AB (ref 101–111)
CO2: 27 mmol/L (ref 22–32)
CREATININE: 0.96 mg/dL (ref 0.61–1.24)
Calcium: 9 mg/dL (ref 8.9–10.3)
GFR calc Af Amer: >60 mL/min (ref >60)
GFR calc non Af Amer: >60 mL/min (ref >60)
GLUCOSE: 116 mg/dL — AB (ref 65–99)
Potassium: 3.9 mmol/L (ref 3.5–5.1)
SODIUM: 129 mmol/L — AB (ref 135–145)

## 2017-06-22 LAB — INFLUENZA PANEL BY PCR (TYPE A & B)
INFLAPCR: POSITIVE — AB
Influenza B By PCR: NEGATIVE

## 2017-06-22 LAB — EXPECTORATED SPUTUM ASSESSMENT W GRAM STAIN, RFLX TO RESP C

## 2017-06-22 LAB — BRAIN NATRIURETIC PEPTIDE: B NATRIURETIC PEPTIDE 5: 118 pg/mL — AB (ref 0.0–100.0)

## 2017-06-22 MED ORDER — FLUTICASONE PROPIONATE 50 MCG/ACT NA SUSP
2.0000 | Freq: Every day | NASAL | Status: DC
  Administered 2017-06-23 – 2017-06-25 (×3): 2 via NASAL
  Filled 2017-06-22: qty 16

## 2017-06-22 MED ORDER — METHYLPREDNISOLONE SODIUM SUCC 125 MG IJ SOLR
125.0000 mg | Freq: Once | INTRAMUSCULAR | Status: AC
  Administered 2017-06-22: 125 mg via INTRAVENOUS
  Filled 2017-06-22: qty 2

## 2017-06-22 MED ORDER — HYDROCODONE-ACETAMINOPHEN 5-325 MG PO TABS: 1.0000 | ORAL_TABLET | ORAL | Status: DC | PRN

## 2017-06-22 MED ORDER — ACETAMINOPHEN 325 MG PO TABS: 650.0000 mg | ORAL_TABLET | Freq: Four times a day (QID) | ORAL | Status: DC | PRN

## 2017-06-22 MED ORDER — IPRATROPIUM-ALBUTEROL 0.5-2.5 (3) MG/3ML IN SOLN
3.0000 mL | RESPIRATORY_TRACT | Status: DC
  Administered 2017-06-22 – 2017-06-24 (×14): 3 mL via RESPIRATORY_TRACT
  Filled 2017-06-22 (×13): qty 3

## 2017-06-22 MED ORDER — OSELTAMIVIR PHOSPHATE 75 MG PO CAPS
75.0000 mg | ORAL_CAPSULE | Freq: Two times a day (BID) | ORAL | Status: DC
  Administered 2017-06-22 – 2017-06-25 (×6): 75 mg via ORAL
  Filled 2017-06-22 (×7): qty 1

## 2017-06-22 MED ORDER — WARFARIN SODIUM 5 MG PO TABS: 5.0000 mg | ORAL_TABLET | Freq: Every evening | ORAL | Status: DC

## 2017-06-22 MED ORDER — ONDANSETRON HCL 4 MG/2ML IJ SOLN: 4.0000 mg | Freq: Four times a day (QID) | INTRAMUSCULAR | Status: DC | PRN

## 2017-06-22 MED ORDER — ACETAMINOPHEN 650 MG RE SUPP
650.0000 mg | Freq: Four times a day (QID) | RECTAL | Status: DC | PRN
Start: 2017-06-22 — End: 2017-06-25

## 2017-06-22 MED ORDER — RAMIPRIL 10 MG PO CAPS
10.0000 mg | ORAL_CAPSULE | Freq: Every day | ORAL | Status: DC
  Administered 2017-06-22 – 2017-06-23 (×2): 10 mg via ORAL
  Filled 2017-06-22 (×2): qty 1

## 2017-06-22 MED ORDER — ASPIRIN EC 81 MG PO TBEC
81.0000 mg | DELAYED_RELEASE_TABLET | Freq: Every day | ORAL | Status: DC
  Administered 2017-06-23 – 2017-06-24 (×2): 81 mg via ORAL
  Filled 2017-06-22 (×2): qty 1

## 2017-06-22 MED ORDER — PANTOPRAZOLE SODIUM 40 MG PO TBEC
40.0000 mg | DELAYED_RELEASE_TABLET | Freq: Every day | ORAL | Status: DC
  Administered 2017-06-23 – 2017-06-25 (×3): 40 mg via ORAL
  Filled 2017-06-22 (×3): qty 1

## 2017-06-22 MED ORDER — MORPHINE SULFATE (PF) 2 MG/ML IV SOLN
0.5000 mg | Freq: Once | INTRAVENOUS | Status: AC
  Administered 2017-06-22: 0.5 mg via INTRAVENOUS
  Filled 2017-06-22: qty 1

## 2017-06-22 MED ORDER — IPRATROPIUM-ALBUTEROL 0.5-2.5 (3) MG/3ML IN SOLN
3.0000 mL | Freq: Once | RESPIRATORY_TRACT | Status: AC
  Administered 2017-06-22: 3 mL via RESPIRATORY_TRACT
  Filled 2017-06-22: qty 3

## 2017-06-22 MED ORDER — WARFARIN SODIUM 5 MG PO TABS
5.0000 mg | ORAL_TABLET | Freq: Every day | ORAL | Status: DC
  Administered 2017-06-22 – 2017-06-24 (×3): 5 mg via ORAL
  Filled 2017-06-22 (×4): qty 1

## 2017-06-22 MED ORDER — GUAIFENESIN ER 600 MG PO TB12
600.0000 mg | ORAL_TABLET | Freq: Two times a day (BID) | ORAL | Status: DC
  Administered 2017-06-22 – 2017-06-25 (×6): 600 mg via ORAL
  Filled 2017-06-22 (×6): qty 1

## 2017-06-22 MED ORDER — WARFARIN - PHARMACIST DOSING INPATIENT
Freq: Every day | Status: DC
  Administered 2017-06-23 – 2017-06-24 (×2)

## 2017-06-22 MED ORDER — METHYLPREDNISOLONE SODIUM SUCC 125 MG IJ SOLR
60.0000 mg | Freq: Three times a day (TID) | INTRAMUSCULAR | Status: DC
  Administered 2017-06-22 – 2017-06-25 (×9): 60 mg via INTRAVENOUS
  Filled 2017-06-22 (×9): qty 2

## 2017-06-22 MED ORDER — LEVOFLOXACIN IN D5W 750 MG/150ML IV SOLN
750.0000 mg | Freq: Once | INTRAVENOUS | Status: AC
  Administered 2017-06-22: 750 mg via INTRAVENOUS
  Filled 2017-06-22: qty 150

## 2017-06-22 MED ORDER — PRAVASTATIN SODIUM 20 MG PO TABS
40.0000 mg | ORAL_TABLET | Freq: Every day | ORAL | Status: DC
  Administered 2017-06-22 – 2017-06-24 (×3): 40 mg via ORAL
  Filled 2017-06-22 (×3): qty 2

## 2017-06-22 MED ORDER — TIOTROPIUM BROMIDE MONOHYDRATE 18 MCG IN CAPS
1.0000 | ORAL_CAPSULE | Freq: Every day | RESPIRATORY_TRACT | Status: DC
  Administered 2017-06-23 – 2017-06-25 (×3): 18 ug via RESPIRATORY_TRACT
  Filled 2017-06-22: qty 5

## 2017-06-22 MED ORDER — FUROSEMIDE 20 MG PO TABS: 10.0000 mg | ORAL_TABLET | Freq: Every day | ORAL | Status: DC | PRN

## 2017-06-22 MED ORDER — POLYETHYLENE GLYCOL 3350 17 G PO PACK
17.0000 g | PACK | Freq: Every day | ORAL | Status: DC | PRN
  Administered 2017-06-25: 17 g via ORAL
  Filled 2017-06-22: qty 1

## 2017-06-22 MED ORDER — BISACODYL 5 MG PO TBEC
5.0000 mg | DELAYED_RELEASE_TABLET | Freq: Every day | ORAL | Status: DC | PRN
  Administered 2017-06-24: 5 mg via ORAL
  Filled 2017-06-22: qty 1

## 2017-06-22 MED ORDER — DOCUSATE SODIUM 100 MG PO CAPS
100.0000 mg | ORAL_CAPSULE | Freq: Every evening | ORAL | Status: DC | PRN
  Administered 2017-06-24: 100 mg via ORAL
  Filled 2017-06-22: qty 1

## 2017-06-22 MED ORDER — METOPROLOL SUCCINATE ER 25 MG PO TB24
25.0000 mg | ORAL_TABLET | Freq: Every day | ORAL | Status: DC
  Administered 2017-06-23 – 2017-06-25 (×2): 25 mg via ORAL
  Filled 2017-06-22 (×3): qty 1

## 2017-06-22 MED ORDER — ACETAMINOPHEN 500 MG PO TABS: 1000.0000 mg | ORAL_TABLET | Freq: Every day | ORAL | Status: DC | PRN

## 2017-06-22 MED ORDER — SODIUM CHLORIDE 0.9 % IV SOLN
INTRAVENOUS | Status: DC
  Administered 2017-06-22: 75 mL/h via INTRAVENOUS

## 2017-06-22 MED ORDER — OSELTAMIVIR PHOSPHATE 75 MG PO CAPS
75.0000 mg | ORAL_CAPSULE | Freq: Once | ORAL | Status: AC
  Administered 2017-06-22: 75 mg via ORAL
  Filled 2017-06-22: qty 1

## 2017-06-22 MED ORDER — ONDANSETRON HCL 4 MG PO TABS: 4.0000 mg | ORAL_TABLET | Freq: Four times a day (QID) | ORAL | Status: DC | PRN

## 2017-06-22 MED ORDER — ALPRAZOLAM 1 MG PO TABS
1.0000 mg | ORAL_TABLET | Freq: Every day | ORAL | Status: DC
  Administered 2017-06-22 – 2017-06-24 (×3): 1 mg via ORAL
  Filled 2017-06-22 (×3): qty 1

## 2017-06-22 MED ORDER — NITROGLYCERIN 0.4 MG SL SUBL: 0.4000 mg | SUBLINGUAL_TABLET | SUBLINGUAL | Status: DC | PRN

## 2017-06-22 MED ORDER — LORATADINE 10 MG PO TABS
10.0000 mg | ORAL_TABLET | Freq: Every day | ORAL | Status: DC
  Administered 2017-06-23 – 2017-06-24 (×2): 10 mg via ORAL
  Filled 2017-06-22 (×2): qty 1

## 2017-06-22 MED ORDER — IPRATROPIUM-ALBUTEROL 0.5-2.5 (3) MG/3ML IN SOLN
3.0000 mL | Freq: Once | RESPIRATORY_TRACT | Status: AC
Start: 2017-06-22 — End: 2017-06-22
  Administered 2017-06-22: 3 mL via RESPIRATORY_TRACT
  Filled 2017-06-22: qty 3

## 2017-06-22 MED ORDER — MOMETASONE FURO-FORMOTEROL FUM 200-5 MCG/ACT IN AERO
2.0000 | INHALATION_SPRAY | Freq: Two times a day (BID) | RESPIRATORY_TRACT | Status: DC
  Administered 2017-06-22 – 2017-06-25 (×6): 2 via RESPIRATORY_TRACT
  Filled 2017-06-22: qty 8.8

## 2017-06-22 NOTE — ED Notes (Signed)
Pt given coke to drink. Ok per Dr. Jimmye Norman.

## 2017-06-22 NOTE — Progress Notes (Signed)
Family Meeting Note  Advance Directive:no  Today a meeting took place with the Patient.spouse The following clinical team members were present during this meeting:MD ER RN  The following were discussed:Patient's diagnosis: Acute on chronic hypoxic respiratory failure with influenza A and COPD exacerbation CAD  PAF PVD Factor V Leiden   Patient's progosis: > 12 months and Goals for treatment: Full Code  Additional follow-up to be provided: chaplain consult for advanced directives  Time spent during discussion: 17 minutes  Tyler Plotkin, MD

## 2017-06-22 NOTE — ED Notes (Signed)
Pt states he has factor 5 and gets blood clots, on warfarin. Has R leg amputated d/t peripheral vascular disease. Pt has medtronic pacemaker L chest.

## 2017-06-22 NOTE — ED Notes (Signed)
Admitting at bedside 

## 2017-06-22 NOTE — ED Notes (Signed)
Pt defecated in bedpan. Cleaned and new brief placed.

## 2017-06-22 NOTE — Progress Notes (Signed)
°   06/22/17 1930  Clinical Encounter Type  Visited With Patient  Visit Type Initial  Referral From Nurse  Consult/Referral To Chaplain  Spiritual Encounters  Spiritual Needs Prayer;Emotional   [name] received an OR to pray with PT. Tyler [name] prayed with PT and offered emotional support

## 2017-06-22 NOTE — ED Triage Notes (Signed)
Pt arrives from home via [facility] for resp distress. Pt has productive cough x few days. Hx COPD. Wears 2 L nasal cannula all the time. Arrives gasping breaths. On 4 L nasal cannula. Oxygen sats high 90's. Pt able to talk, does have to take breaths to catch breath. Obvious distress. Moved from [facility] to room 9 for closer observation.

## 2017-06-22 NOTE — ED Notes (Signed)
Pt given supper tray.

## 2017-06-22 NOTE — ED Notes (Signed)
Pt in xray. Will administer medications and draw blood cultures when pt returns.

## 2017-06-22 NOTE — ED Notes (Addendum)
Explained to pt about obtaining sputum culture and gave specimen cup. Pt verbalized understanding. Pt states he feels like his breathing is better at this time. Breathing is no longer labored. Pt weaned down to 3 L nasal cannula at this time.

## 2017-06-22 NOTE — H&P (Addendum)
[place] [facility] [place] NAME: Tyler [name]    MR#:  [id number]  DATE OF BIRTH:  1955-06-20  DATE OF ADMISSION:  06/22/2017  PRIMARY CARE PHYSICIAN: [name], MD   REQUESTING/REFERRING PHYSICIAN: dr Jimmye Norman  CHIEF COMPLAINT:   SOB HISTORY OF PRESENT ILLNESS:  Tyler [name]  is a 62 y.o. male with a known history of factor V Leiden and PAF currently on Coumadin, CAD, chronic hypoxic respiratory failure and 2 L of oxygen due to COPD and chronic systolicheart failure with ejection fraction of 35% who presents with shortness of breath, wheezing, fever, chills and body aches. These symptoms started this morning. In the emergency room he tested positive for influenza A. He has been given IV steroids and nebulizer treatments however continues to have tachypnea and feelings shortness of breath.  PAST MEDICAL HISTORY:   Past Medical History:  Diagnosis Date  . AICD (automatic cardioverter/defibrillator) present   . Atrial fibrillation ([place])   . CHF (congestive heart failure) ([place])   . COPD (chronic obstructive pulmonary disease) ([place])   . Coronary artery disease   . Depression   . Dyspnea   . GERD (gastroesophageal reflux disease)   . Hypertension   . Myocardial infarction ([place])   . Peripheral vascular disease ([place])   . Presence of permanent cardiac pacemaker     PAST SURGICAL HISTORY:   Past Surgical History:  Procedure Laterality Date  . ABOVE KNEE LEG AMPUTATION Right    after below the knee amputation   . BELOW KNEE LEG AMPUTATION Right   . CATARACT EXTRACTION, BILATERAL Bilateral   . IMPLANTABLE CARDIOVERTER DEFIBRILLATOR (ICD) GENERATOR CHANGE Left 02/10/2017   Procedure: ICD GENERATOR CHANGE;  Surgeon: Isaias Cowman, MD;  Location: [facility] ORS;  Service: Cardiovascular;  Laterality: Left;  . INSERT / REPLACE / REMOVE PACEMAKER    . LOWER EXTREMITY ANGIOGRAPHY Left 06/07/2017   Procedure: LOWER EXTREMITY ANGIOGRAPHY;   Surgeon: Algernon Huxley, MD;  Location: [place] [facility];  Service: Cardiovascular;  Laterality: Left;  . LOWER EXTREMITY INTERVENTION  06/07/2017   Procedure: LOWER EXTREMITY INTERVENTION;  Surgeon: Algernon Huxley, MD;  Location: [place] [facility];  Service: Cardiovascular;;  . PERIPHERAL VASCULAR CATHETERIZATION Left 12/09/2015   Procedure: Lower Extremity Angiography;  Surgeon: Algernon Huxley, MD;  Location: [place] [facility];  Service: Cardiovascular;  Laterality: Left;  . PERIPHERAL VASCULAR CATHETERIZATION  12/09/2015   Procedure: Lower Extremity Intervention;  Surgeon: Algernon Huxley, MD;  Location: [place] [facility];  Service: Cardiovascular;;  . TONSILLECTOMY      SOCIAL HISTORY:   Social History   Tobacco Use  . Smoking status: Former Smoker    Packs/day: 1.00    Years: 42.00    Pack years: 42.00    Types: Cigarettes    Last attempt to quit: 09/23/2013    Years since quitting: 3.7  . Smokeless tobacco: Never Used  Substance Use Topics  . Alcohol use: No    FAMILY HISTORY:   Family History  Problem Relation Age of Onset  . Cancer Mother   . Cancer Father   . Heart disease Father     DRUG ALLERGIES:   Allergies  Allergen Reactions  . Apixaban Rash  . Rivaroxaban Rash    REVIEW OF SYSTEMS:   Review of Systems  Constitutional: Positive for chills, fever and malaise/fatigue.  HENT: Negative.  Negative for ear discharge, ear pain, hearing loss, nosebleeds and sore throat.  Eyes: Negative.  Negative for blurred vision and pain.  Respiratory: Positive for cough, shortness of breath and wheezing. Negative for hemoptysis.   Cardiovascular: Negative.  Negative for chest pain, palpitations and leg swelling.  Gastrointestinal: Negative.  Negative for abdominal pain, blood in stool, diarrhea, nausea and vomiting.  Genitourinary: Negative.  Negative for dysuria.  Musculoskeletal: Negative.  Negative for back pain.  Skin: Negative.   Neurological: Positive for  weakness. Negative for dizziness, tremors, speech change, focal weakness, seizures and headaches.  Endo/Heme/Allergies: Negative.  Does not bruise/bleed easily.  Psychiatric/Behavioral: Negative.  Negative for depression, hallucinations and suicidal ideas.    MEDICATIONS AT HOME:   Prior to Admission medications   Medication Sig Start Date End Date Taking? Authorizing Provider  acetaminophen (TYLENOL) 500 MG tablet Take 1,000 mg by mouth daily as needed for moderate pain or headache.    [provider]  ALPRAZolam Duanne Moron) 1 MG tablet Take 1 mg by mouth at bedtime.    [provider]  aspirin EC 81 MG tablet Take 1 tablet (81 mg total) by mouth daily. Patient taking differently: Take 81 mg by mouth daily at 2 PM. (midday) 12/09/15   [name], Erskine Squibb, MD  docusate sodium (COLACE) 100 MG capsule Take 100 mg by mouth at bedtime as needed (for constipation.).     [provider]  enoxaparin (LOVENOX) 80 MG/0.8ML injection Inject 70 mg into the skin every 12 (twelve) hours. 06/07/17   [provider]  fluticasone (FLONASE) 50 MCG/ACT nasal spray Place 2 sprays into both nostrils daily.    [provider]  fluticasone-salmeterol (ADVAIR HFA) 115-21 MCG/ACT inhaler USE 2 INHALATIONS ORALLY   EVERY 12 HOURS 06/14/17   Flora Lipps, MD  furosemide (LASIX) 20 MG tablet Take 10 mg by mouth daily as needed (for fluid retention.).     [provider]  guaiFENesin (MUCINEX) 600 MG 12 hr tablet Take 600 mg by mouth 2 (two) times daily.    [provider]  ipratropium (ATROVENT) 0.03 % nasal spray Place 2 sprays into both nostrils 2 (two) times daily.     [provider]  Ipratropium-Albuterol (COMBIVENT RESPIMAT) 20-100 MCG/ACT AERS respimat Inhale 1 puff into the lungs every 4 (four) hours.     [provider]  levalbuterol (XOPENEX) 1.25 MG/3ML nebulizer solution Take 1.25 mg (3 mLs total) by nebulization every 4 (four) hours. Patient  taking differently: Take 3 mLs by nebulization every 4 (four) hours as needed for wheezing or shortness of breath ((typically twice daily)).  10/23/16   Theodoro Grist, MD  loratadine (CLARITIN) 10 MG tablet Take 10 mg by mouth at bedtime.    [provider]  lovastatin (MEVACOR) 20 MG tablet Take 20 mg by mouth at bedtime.    [provider]  metoprolol succinate (TOPROL-XL) 25 MG 24 hr tablet Take 25 mg by mouth daily.    [provider]  nitroGLYCERIN (NITROSTAT) 0.4 MG SL tablet Place 0.4 mg under the tongue every 5 (five) minutes as needed for chest pain.    [provider]  nystatin ointment (MYCOSTATIN) Apply 1 application topically at bedtime. Applied to corners of mouth 12/10/16   [provider]  pantoprazole (PROTONIX) 40 MG tablet Take 40 mg by mouth daily before breakfast.     [provider]  predniSONE (STERAPRED UNI-PAK 21 TAB) 10 MG (21) TBPK tablet Take as directed per taper Patient not taking: Reported on 06/07/2017 02/24/17   [place]le Hobby, MD  ramipril (ALTACE) 10 MG capsule Take 10 mg by mouth at bedtime.     [provider]  simethicone (MYLICON) 80 MG chewable tablet Chew 1 tablet (80 mg total) by mouth 4 (four) times daily. Patient taking differently: Chew 80 mg by mouth 2 (two) times daily as needed for flatulence.  10/23/16   Theodoro Grist, MD  Tiotropium Bromide Monohydrate (SPIRIVA RESPIMAT) 1.25 MCG/ACT AERS Inhale 2 puffs into the lungs daily. 02/01/17   Flora Lipps, MD  warfarin (COUMADIN) 5 MG tablet Take 5 mg by mouth every evening.     [provider]      VITAL SIGNS:  Blood pressure 120/69, pulse 76, temperature 98.5 F (36.9 C), temperature source Oral, resp. rate (!) 22, height 6\' 1"  (1.854 m), weight 68 kg (150 lb), SpO2 98 %.  PHYSICAL EXAMINATION:   Physical Exam  Constitutional: He is oriented to person, place, and time and well-developed, well-nourished, and in no distress. No  distress.  HENT:  Head: Normocephalic.  Eyes: No scleral icterus.  Neck: Normal range of motion. Neck supple. No JVD present. No tracheal deviation present.  Cardiovascular: Normal rate, regular rhythm and normal heart sounds. Exam reveals no gallop and no friction rub.  No murmur heard. Pulmonary/Chest: No respiratory distress. He has wheezes. He has no rales. He exhibits no tenderness.  increased respiratory effort with mild audible wheezing bilaterally. Patient sounds very tight  Abdominal: Soft. Bowel sounds are normal. He exhibits no distension and no mass. There is no tenderness. There is no rebound and no guarding.  Musculoskeletal: Normal range of motion. He exhibits no edema.  Right leg amputation   Neurological: He is alert and oriented to person, place, and time.  Skin: Skin is warm. No rash noted. No erythema.  Psychiatric: Affect and judgment normal.      LABORATORY PANEL:   CBC Recent Labs  Lab 06/22/17 1138  WBC 13.7*  HGB 13.0  HCT 39.0*  PLT 308   ------------------------------------------------------------------------------------------------------------------  Chemistries  Recent Labs  Lab 06/22/17 1138  NA 129*  K 3.9  CL 93*  CO2 27  GLUCOSE 116*  BUN 13  CREATININE 0.96  CALCIUM 9.0   ------------------------------------------------------------------------------------------------------------------  Cardiac Enzymes Recent Labs  Lab 06/22/17 1138  TROPONINI <0.03   ------------------------------------------------------------------------------------------------------------------  RADIOLOGY:  Dg Chest 2 View  Result Date: 06/22/2017 CLINICAL DATA:  COPD, CHF, shortness of breath EXAM: CHEST  2 VIEW COMPARISON:  05/19/2017 FINDINGS: Lungs are clear.  No pleural effusion or pneumothorax. The heart is normal in size.  Left subclavian ICD. Visualized osseous structures are within normal limits, noting an old left posterolateral rib fracture  deformity. IMPRESSION: No evidence of acute cardiopulmonary disease. Electronically Signed   By: Julian Hy M.D.   On: 06/22/2017 12:08    EKG:  Atrial fibrillation Paced rhythm  IMPRESSION AND PLAN:   62 year old male with history of chronic hypoxic respiratory failure on 2 L of oxygen due to COPD, factor V Leiden and PAF currently on Coumadin therapy, severe peripheral vascular disease status post amputation of right legand CAD who presents with shortness of breath.  1. Acute on chronic hypoxic respiratory failure: Wean oxygen to 2 L nasal cannula as tolerated  2. Influenza a: Start Tamiflu. Treat for total of 5 days. I discussed with wife that she should call her PCP to perhaps start Tamiflu as well. Continue respiratory droplets  3. Acute COPD exacerbation due to problem #2 Start IV steroids and nebulizer treatments  4.  History of factor V Leiden: Pharmacy consultation for Coumadin  5. PAF: Continue Coumadinand metoprolol for heart rate control  6. Chronic systolic heart failure without signs of exacerbation:Continue metoprolol and ramipril  7. Essential hypertension: Continue metoprolol and ramipril  8. CAD: Continue aspirin, lovastatin, metoprolol 9. PVD/PAD s/p right AKA and  with recent Stent placement to the distal SFA Patient has an appointment today with vascular surgery. Discussed with Dr. Lucky Cowboy he will need outpatient follow up for ABI's , etc after discharge.     10 s/p AICD: Follow [facility] cardiology  All the records are reviewed and case discussed with ED provider. Management plans discussed with the patient and he is in agreement  CODE STATUS: full  TOTAL TIME TAKING CARE OF THIS PATIENT: 43 minutes.    Tyler [name] M.D on 06/22/2017 at 1:34 PM  Between 7am to 6pm - Pager - [phone]  After 6pm go to [email or web address] - password [email or web address] [place] [facility]  Office  [phone]  CC: Primary care physician; [name],  MD

## 2017-06-22 NOTE — Progress Notes (Signed)
Pt noted to have shortness of breath and wheezing upon auscultation. O2 sats=100% on 4 liters via nasal cannula. Pt on chronic oxygen at home at 2 liters. Breathing treatment and inhaler administered with minimal relief, pt still short of breath. Dr. Benjie Karvonen paged and ordered for one time dose Morphine 0.5mg  IV. Will administer as ordered and continue to monitor.

## 2017-06-22 NOTE — Progress Notes (Signed)
ANTICOAGULATION CONSULT NOTE - Initial Consult  Pharmacy Consult for warfarin Indication: atrial fibrillation  Allergies  Allergen Reactions  . Apixaban Rash  . Rivaroxaban Rash    Patient Measurements: Height: 6\' 1"  (185.4 cm) Weight: 150 lb (68 kg) IBW/kg (Calculated) : 79.9 Heparin Dosing Weight:   Vital Signs: Temp: 98.5 F (36.9 C) (01/29 1152) Temp Source: Oral (01/29 1152) BP: 117/61 (01/29 1430) Pulse Rate: 74 (01/29 1430)  Labs: Recent Labs    06/22/17 1138  HGB 13.0  HCT 39.0*  PLT 308  LABPROT 23.1*  INR 2.07  CREATININE 0.96  TROPONINI <0.03    Estimated Creatinine Clearance: 77.7 mL/min (by C-G formula based on SCr of 0.96 mg/dL).   Medical History: Past Medical History:  Diagnosis Date  . AICD (automatic cardioverter/defibrillator) present   . Atrial fibrillation ([place])   . CHF (congestive heart failure) ([place])   . COPD (chronic obstructive pulmonary disease) ([place])   . Coronary artery disease   . Depression   . Dyspnea   . GERD (gastroesophageal reflux disease)   . Hypertension   . Myocardial infarction ([place])   . Peripheral vascular disease ([place])   . Presence of permanent cardiac pacemaker     Medications:   (Not in a hospital admission) Scheduled:  . warfarin  5 mg Oral q1800  . Warfarin - Pharmacist Dosing Inpatient   Does not apply q1800    Assessment: Pharmacy consulted to dose and monitor warfarin in this 62 year old male being treated for atrial fibrillation. Home dose: 5 mg daily Goal of Therapy:  INR 2-3 Monitor platelets by anticoagulation protocol: Yes   Plan:  INR is 2.07. Will continue home dose of warfarin 5 mg PO daily.   Shah Insley D 06/22/2017,2:58 PM

## 2017-06-22 NOTE — ED Provider Notes (Addendum)
[facility] Emergency Department Provider Note       Time seen: ----------------------------------------- 11:49 AM on 06/22/2017 -----------------------------------------   I have reviewed the triage vital signs and the nursing notes.  HISTORY   Chief Complaint Respiratory Distress    HPI Tyler [name] is a 62 y.o. male with a history of atrial fibrillation, CHF, COPD, coronary artery disease, GERD, hypertension and MI who presents to the ED for respiratory distress.  Patient reports difficulty breathing for several days.  Patient is concerned he may have pneumonia.  He denies fevers or chills but has had increasing shortness of breath with productive sputum.  Patient states when he coughs his chest feels very raw  Past Medical History:  Diagnosis Date  . AICD (automatic cardioverter/defibrillator) present   . Atrial fibrillation ([place])   . CHF (congestive heart failure) ([place])   . COPD (chronic obstructive pulmonary disease) ([place])   . Coronary artery disease   . Depression   . Dyspnea   . GERD (gastroesophageal reflux disease)   . Hypertension   . Myocardial infarction ([place])   . Peripheral vascular disease ([place])   . Presence of permanent cardiac pacemaker     Patient Active Problem List   Diagnosis Date Noted  . Cervical disc disease with myelopathy 05/28/2017  . COPD with acute exacerbation ([place]) 10/13/2016  . Acute respiratory failure with hypoxia and hypercapnia ([place]) 10/13/2016  . Chronic systolic CHF (congestive heart failure) ([place]) 10/13/2016  . AF (paroxysmal atrial fibrillation) ([place]) 10/13/2016  . Hx of AKA (above knee amputation), right ([place]) 05/15/2016  . Essential hypertension, benign 05/15/2016  . PVD (peripheral vascular disease) ([place]) 05/15/2016  . Atherosclerotic peripheral vascular disease with intermittent claudication ([place]) 08/01/2014  . Moderate tricuspid insufficiency 08/01/2014  . Mixed hyperlipidemia 07/23/2014  .  AICD (automatic cardioverter/defibrillator) present 06/12/2011  . Factor V Leiden mutation ([place]) 06/12/2011    Past Surgical History:  Procedure Laterality Date  . ABOVE KNEE LEG AMPUTATION Right    after below the knee amputation   . BELOW KNEE LEG AMPUTATION Right   . CATARACT EXTRACTION, BILATERAL Bilateral   . IMPLANTABLE CARDIOVERTER DEFIBRILLATOR (ICD) GENERATOR CHANGE Left 02/10/2017   Procedure: ICD GENERATOR CHANGE;  Surgeon: Isaias Cowman, MD;  Location: [facility] ORS;  Service: Cardiovascular;  Laterality: Left;  . INSERT / REPLACE / REMOVE PACEMAKER    . LOWER EXTREMITY ANGIOGRAPHY Left 06/07/2017   Procedure: LOWER EXTREMITY ANGIOGRAPHY;  Surgeon: Algernon Huxley, MD;  Location: [place] CV LAB;  Service: Cardiovascular;  Laterality: Left;  . LOWER EXTREMITY INTERVENTION  06/07/2017   Procedure: LOWER EXTREMITY INTERVENTION;  Surgeon: Algernon Huxley, MD;  Location: [place] CV LAB;  Service: Cardiovascular;;  . PERIPHERAL VASCULAR CATHETERIZATION Left 12/09/2015   Procedure: Lower Extremity Angiography;  Surgeon: Algernon Huxley, MD;  Location: [place] CV LAB;  Service: Cardiovascular;  Laterality: Left;  . PERIPHERAL VASCULAR CATHETERIZATION  12/09/2015   Procedure: Lower Extremity Intervention;  Surgeon: Algernon Huxley, MD;  Location: [place] CV LAB;  Service: Cardiovascular;;  . TONSILLECTOMY      Allergies Apixaban and Rivaroxaban  Social History Social History   Tobacco Use  . Smoking status: Former Smoker    Packs/day: 1.00    Years: 42.00    Pack years: 42.00    Types: Cigarettes    Last attempt to quit: 09/23/2013    Years since quitting: 3.7  . Smokeless tobacco: Never Used  Substance Use Topics  . Alcohol  use: No  . Drug use: No    Review of Systems Constitutional: Negative for fever. Cardiovascular: Positive for chest pain Respiratory: Positive shortness of breath and productive cough Gastrointestinal: Negative for abdominal pain, vomiting  and diarrhea. Musculoskeletal: Negative for back pain. Skin: Negative for rash. Neurological: Positive for weakness  All systems negative/normal/unremarkable except as stated in the HPI  ____________________________________________   PHYSICAL EXAM:  VITAL SIGNS: ED Triage Vitals  Enc Vitals Group     BP      Pulse      Resp      Temp      Temp src      SpO2      Weight      Height      Head Circumference      Peak Flow      Pain Score      Pain Loc      Pain Edu?      Excl. in [place]?     Constitutional: Alert and oriented.  Mild distress Eyes: Conjunctivae are normal. Normal extraocular movements. ENT   Head: Normocephalic and atraumatic.   Nose: No congestion/rhinnorhea.   Mouth/Throat: Mucous membranes are moist.   Neck: No stridor. Cardiovascular: Normal rate, regular rhythm. No murmurs, rubs, or gallops. Respiratory: Tachypnea with diminished breath sounds bilaterally, scattered rhonchi Gastrointestinal: Soft and nontender. Normal bowel sounds Musculoskeletal: Nontender with normal range of motion in extremities. No lower extremity tenderness nor edema. Neurologic:  Normal speech and language. No gross focal neurologic deficits are appreciated.  Skin:  Skin is warm, dry and intact. No rash noted. Psychiatric: Mood and affect are normal. Speech and behavior are normal.  ____________________________________________  EKG: Interpreted by me.  Atrial fibrillation with ventricular paced rhythm with a rate of 77 beats per normal EKG  ____________________________________________  ED COURSE:  As part of my medical decision making, I reviewed the following data within the [place] History obtained from family if available, nursing notes, old chart and ekg, as well as notes from prior ED visits. Patient presented for respiratory distress, we will assess with labs and imaging as indicated at this time.  Patient received duo nebs and  steroids Clinical Course as of Jun 22 1452  Tue Jun 22, 2017  1318 Influenza A By PCR: (!) POSITIVE [JW]    Clinical Course User Index [JW] Earleen Newport, MD   Procedures ____________________________________________   LABS (pertinent positives/negatives)  Labs Reviewed  CBC WITH DIFFERENTIAL/PLATELET - Abnormal; Notable for the following components:      Result Value   WBC 13.7 (*)    HCT 39.0 (*)    RDW 14.7 (*)    Neutro Abs 12.2 (*)    Lymphs Abs 0.5 (*)    All other components within normal limits  BASIC METABOLIC PANEL - Abnormal; Notable for the following components:   Sodium 129 (*)    Chloride 93 (*)    Glucose, Bld 116 (*)    All other components within normal limits  BRAIN NATRIURETIC PEPTIDE - Abnormal; Notable for the following components:   B Natriuretic Peptide 118.0 (*)    All other components within normal limits  BLOOD GAS, VENOUS - Abnormal; Notable for the following components:   Bicarbonate 29.4 (*)    All other components within normal limits  INFLUENZA PANEL BY PCR (TYPE A & B) - Abnormal; Notable for the following components:   Influenza A By PCR POSITIVE (*)  All other components within normal limits  PROTIME-INR - Abnormal; Notable for the following components:   Prothrombin Time 23.1 (*)    All other components within normal limits  CULTURE, EXPECTORATED SPUTUM-ASSESSMENT  CULTURE, BLOOD (ROUTINE X 2)  CULTURE, BLOOD (ROUTINE X 2)  CULTURE, RESPIRATORY (NON-EXPECTORATED)  TROPONIN I    RADIOLOGY Images were viewed by me  Chest x-ray  IMPRESSION: No evidence of acute cardiopulmonary disease. ____________________________________________  DIFFERENTIAL DIAGNOSIS   COPD, CHF, pneumonia, pneumothorax, influenza, sepsis  FINAL ASSESSMENT AND PLAN  COPD exacerbation   Plan: Patient had presented for respiratory distress. Patient's labs revealed mild hyponatremia and mild leukocytosis. Patient's imaging did not reveal any  pneumonia.  He was given multiple duo nebs as well as steroids and Levaquin and still does not feel well enough to go home.  He does appear to be having a COPD exacerbation and would benefit from hospital observation.   Earleen Newport, MD   Note: This note was generated in part or whole with voice recognition software. Voice recognition is usually quite accurate but there are transcription errors that can and very often do occur. I apologize for any typographical errors that were not detected and corrected.     Earleen Newport, MD 06/22/17 1306    Earleen Newport, MD 06/22/17 [phone]

## 2017-06-23 LAB — CBC
HCT: 34.8 % — ABNORMAL LOW (ref 40.0–52.0)
Hemoglobin: 11.8 g/dL — ABNORMAL LOW (ref 13.0–18.0)
MCH: 29.9 pg (ref 26.0–34.0)
MCHC: 33.8 g/dL (ref 32.0–36.0)
MCV: 88.5 fL (ref 80.0–100.0)
Platelets: 266 10*3/uL (ref 150–440)
RBC: 3.93 MIL/uL — ABNORMAL LOW (ref 4.40–5.90)
RDW: 14.5 % (ref 11.5–14.5)
WBC: 16.3 10*3/uL — AB (ref 3.8–10.6)

## 2017-06-23 LAB — BLOOD CULTURE ID PANEL (REFLEXED)
ACINETOBACTER BAUMANNII: NOT DETECTED
CANDIDA TROPICALIS: NOT DETECTED
CARBAPENEM RESISTANCE: NOT DETECTED
Candida albicans: NOT DETECTED
Candida glabrata: NOT DETECTED
Candida krusei: NOT DETECTED
Candida parapsilosis: NOT DETECTED
ENTEROCOCCUS SPECIES: NOT DETECTED
ESCHERICHIA COLI: NOT DETECTED
Enterobacter cloacae complex: NOT DETECTED
Enterobacteriaceae species: NOT DETECTED
HAEMOPHILUS INFLUENZAE: NOT DETECTED
Klebsiella oxytoca: NOT DETECTED
Klebsiella pneumoniae: NOT DETECTED
LISTERIA MONOCYTOGENES: NOT DETECTED
METHICILLIN RESISTANCE: DETECTED — AB
NEISSERIA MENINGITIDIS: NOT DETECTED
PROTEUS SPECIES: NOT DETECTED
Pseudomonas aeruginosa: NOT DETECTED
SERRATIA MARCESCENS: NOT DETECTED
STAPHYLOCOCCUS AUREUS BCID: NOT DETECTED
STAPHYLOCOCCUS SPECIES: DETECTED — AB
STREPTOCOCCUS PYOGENES: NOT DETECTED
STREPTOCOCCUS SPECIES: NOT DETECTED
Streptococcus agalactiae: NOT DETECTED
Streptococcus pneumoniae: NOT DETECTED
VANCOMYCIN RESISTANCE: NOT DETECTED

## 2017-06-23 LAB — BASIC METABOLIC PANEL
ANION GAP: 8 (ref 5–15)
BUN: 11 mg/dL (ref 6–20)
CALCIUM: 9.1 mg/dL (ref 8.9–10.3)
CO2: 25 mmol/L (ref 22–32)
Chloride: 98 mmol/L — ABNORMAL LOW (ref 101–111)
Creatinine, Ser: 0.76 mg/dL (ref 0.61–1.24)
GFR calc Af Amer: >60 mL/min (ref >60)
Glucose, Bld: 126 mg/dL — ABNORMAL HIGH (ref 65–99)
POTASSIUM: 4 mmol/L (ref 3.5–5.1)
SODIUM: 131 mmol/L — AB (ref 135–145)

## 2017-06-23 LAB — PROTIME-INR
INR: 2.48
PROTHROMBIN TIME: 26.6 s — AB (ref 11.4–15.2)

## 2017-06-23 MED ORDER — PREMIER PROTEIN SHAKE
11.0000 [oz_av] | ORAL | Status: DC
  Administered 2017-06-24: 11 [oz_av] via ORAL

## 2017-06-23 MED ORDER — ADULT MULTIVITAMIN W/MINERALS CH
1.0000 | ORAL_TABLET | Freq: Every day | ORAL | Status: DC
  Administered 2017-06-24 – 2017-06-25 (×2): 1 via ORAL
  Filled 2017-06-23 (×2): qty 1

## 2017-06-23 MED ORDER — DEXTROSE 5 % IV SOLN
2.0000 g | INTRAVENOUS | Status: DC
  Administered 2017-06-23 – 2017-06-24 (×2): 2 g via INTRAVENOUS
  Filled 2017-06-23 (×3): qty 2

## 2017-06-23 MED ORDER — AZITHROMYCIN 500 MG PO TABS
500.0000 mg | ORAL_TABLET | Freq: Every day | ORAL | Status: DC
  Administered 2017-06-23 – 2017-06-25 (×3): 500 mg via ORAL
  Filled 2017-06-23 (×3): qty 1

## 2017-06-23 NOTE — Progress Notes (Signed)
PHARMACY - PHYSICIAN COMMUNICATION CRITICAL VALUE ALERT - BLOOD CULTURE IDENTIFICATION (BCID)  BREKKEN BEACH is an 62 y.o. male who presented to [facility] on 06/22/2017 with a chief complaint of COPD exacerbation    Name of physician (or Provider) Contacted: Kalis[place]  Current antibiotics: none  Changes to prescribed antibiotics recommended:  No abxs at this time per MD, likely a contaminant   No results found for this or any previous visit.  Damaria Vachon D 06/23/2017  9:57 AM

## 2017-06-23 NOTE — Progress Notes (Signed)
ANTICOAGULATION CONSULT NOTE - Initial Consult  Pharmacy Consult for warfarin Indication: atrial fibrillation  Allergies  Allergen Reactions  . Apixaban Rash  . Rivaroxaban Rash    Patient Measurements: Height: '6\' 1"'  (185.4 cm) Weight: 150 lb (68 kg) IBW/kg (Calculated) : 79.9 Heparin Dosing Weight:   Vital Signs: Temp: 97.9 F (36.6 C) (01/30 0730) Temp Source: Oral (01/30 0730) BP: 103/51 (01/30 0730) Pulse Rate: 77 (01/30 0730)  Labs: Recent Labs    06/22/17 1138 06/23/17 0447  HGB 13.0 11.8*  HCT 39.0* 34.8*  PLT 308 266  LABPROT 23.1* 26.6*  INR 2.07 2.48  CREATININE 0.96 0.76  TROPONINI <0.03  --     Estimated Creatinine Clearance: 93.3 mL/min (by C-G formula based on SCr of 0.76 mg/dL).   Medical History: Past Medical History:  Diagnosis Date  . AICD (automatic cardioverter/defibrillator) present   . Atrial fibrillation ([place])   . CHF (congestive heart failure) ([place])   . COPD (chronic obstructive pulmonary disease) ([place])   . Coronary artery disease   . Depression   . Dyspnea   . GERD (gastroesophageal reflux disease)   . Hypertension   . Myocardial infarction ([place])   . Peripheral vascular disease ([place])   . Presence of permanent cardiac pacemaker     Medications:  Medications Prior to Admission  Medication Sig Dispense Refill Last Dose  . ALPRAZolam (XANAX) 1 MG tablet Take 1 mg by mouth 2 (two) times daily as needed.    06/22/2017 at Unknown time  . aspirin EC 81 MG tablet Take 1 tablet (81 mg total) by mouth daily. (Patient taking differently: Take 81 mg by mouth daily at 2 PM. (midday)) 150 tablet 2 06/22/2017 at Unknown time  . Cyanocobalamin (B-12 COMPLIANCE INJECTION) 1000 MCG/ML KIT Inject 1,000 mcg as directed once a week.     . docusate sodium (COLACE) 100 MG capsule Take 100 mg by mouth at bedtime as needed (for constipation.).    06/22/2017 at Unknown time  . fluticasone (FLONASE) 50 MCG/ACT nasal spray Place 2 sprays into both nostrils  daily.   06/22/2017 at Unknown time  . fluticasone-salmeterol (ADVAIR HFA) 115-21 MCG/ACT inhaler USE 2 INHALATIONS ORALLY   EVERY 12 HOURS 12 g 5 06/22/2017 at Unknown time  . furosemide (LASIX) 20 MG tablet Take 10 mg by mouth daily as needed (for fluid retention.).    06/22/2017 at Unknown time  . guaiFENesin (MUCINEX) 600 MG 12 hr tablet Take 600 mg by mouth 2 (two) times daily.   06/22/2017 at Unknown time  . ipratropium (ATROVENT) 0.03 % nasal spray Place 2 sprays into both nostrils 2 (two) times daily.    06/22/2017 at Unknown time  . Ipratropium-Albuterol (COMBIVENT RESPIMAT) 20-100 MCG/ACT AERS respimat Inhale 1 puff into the lungs every 4 (four) hours.    06/22/2017 at Unknown time  . levalbuterol (XOPENEX) 1.25 MG/3ML nebulizer solution Take 1.25 mg (3 mLs total) by nebulization every 4 (four) hours. (Patient taking differently: Take 3 mLs by nebulization every 4 (four) hours as needed for wheezing or shortness of breath ((typically twice daily)). ) 72 mL 12 06/22/2017 at prn  . loratadine (CLARITIN) 10 MG tablet Take 10 mg by mouth at bedtime.   06/22/2017 at Unknown time  . lovastatin (MEVACOR) 20 MG tablet Take 20 mg by mouth at bedtime.   06/21/2017 at Unknown time  . metoprolol succinate (TOPROL-XL) 25 MG 24 hr tablet Take 25 mg by mouth daily.   06/22/2017 at Unknown time  .  pantoprazole (PROTONIX) 40 MG tablet Take 40 mg by mouth daily before breakfast.    06/22/2017 at Unknown time  . ramipril (ALTACE) 10 MG capsule Take 10 mg by mouth at bedtime.    06/21/2017 at Unknown time  . Tiotropium Bromide Monohydrate (SPIRIVA RESPIMAT) 1.25 MCG/ACT AERS Inhale 2 puffs into the lungs daily. 12 g 2 06/22/2017 at Unknown time  . warfarin (COUMADIN) 5 MG tablet Take 5 mg by mouth at bedtime.    06/21/2017 at Unknown time  . acetaminophen (TYLENOL) 500 MG tablet Take 1,000 mg by mouth daily as needed for moderate pain or headache.   prn at prn  . nitroGLYCERIN (NITROSTAT) 0.4 MG SL tablet Place 0.4 mg under  the tongue every 5 (five) minutes as needed for chest pain.   prn at prn  . nystatin ointment (MYCOSTATIN) Apply 1 application topically at bedtime. Applied to corners of mouth  0 prn at prn  . predniSONE (STERAPRED UNI-PAK 21 TAB) 10 MG (21) TBPK tablet Take as directed per taper (Patient not taking: Reported on 06/07/2017) 21 tablet 0 Not Taking at Unknown time  . simethicone (MYLICON) 80 MG chewable tablet Chew 1 tablet (80 mg total) by mouth 4 (four) times daily. (Patient not taking: Reported on 06/22/2017) 30 tablet 0 Not Taking at Unknown time   Scheduled:  . ALPRAZolam  1 mg Oral QHS  . aspirin EC  81 mg Oral Q1400  . fluticasone  2 spray Each Nare Daily  . guaiFENesin  600 mg Oral BID  . ipratropium-albuterol  3 mL Nebulization Q4H  . loratadine  10 mg Oral QHS  . methylPREDNISolone (SOLU-MEDROL) injection  60 mg Intravenous Q8H  . metoprolol succinate  25 mg Oral Daily  . mometasone-formoterol  2 puff Inhalation BID  . oseltamivir  75 mg Oral BID  . pantoprazole  40 mg Oral QAC breakfast  . pravastatin  40 mg Oral q1800  . ramipril  10 mg Oral QHS  . tiotropium  1 capsule Inhalation Daily  . warfarin  5 mg Oral q1800  . Warfarin - Pharmacist Dosing Inpatient   Does not apply q1800    Assessment: Pharmacy consulted to dose and monitor warfarin in this 62 year old male being treated for atrial fibrillation. Home dose: 5 mg daily  1/30:  Goal of Therapy:  INR 2-3 Monitor platelets by anticoagulation protocol: Yes   Plan:  INR is 2.48. Will continue home dose of warfarin 5 mg. Will likely need a dose reduction if INR continues to increase rapidly.   Aisea Bouldin D 06/23/2017,8:35 AM

## 2017-06-23 NOTE — Progress Notes (Signed)
Initial Nutrition Assessment  DOCUMENTATION CODES:   Not applicable  INTERVENTION:  Provide Premier Protein po once daily, each supplement provides 160 kcal and 30 grams of protein.  Provide daily MVI.  NUTRITION DIAGNOSIS:   Inadequate oral intake related to decreased appetite as evidenced by per patient/family report.  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   Met with patient at bedside. He reports he has had a decreased appetite for a few days. He has a history of a right AKA and reports for over 20 years he walked with crutches. Lately he has been getting weaker so he has not been standing up at all for a while now. He eats 2 meals per day. Breakfast is usually grits, scrambled eggs, or Spam. Dinner is usually salmon patties, chicken, or pork chops with either canned vegetables or potatoes.  UBW 190 lbs many years ago. He reports he has had slow weight loss over many years. Unsure accuracy of current weight as it is reported. Attempted to check weight on bed scale but it was not accurate as it was reading 194.3 lbs, which patient says is not accurate (and he does not appear to weigh that much).  Medications reviewed and include: Xanax, azithromycin, Solu-Medrol 60 mg Q8hrs IV, Tamiflu, pantoprazole, warfarin, ceftriaxone.  Labs reviewed: Sodium 131, Chloride 98.  Patient does not meet criteria for malnutrition at this time.  NUTRITION - FOCUSED PHYSICAL EXAM:    Most Recent Value  Orbital Region  Severe depletion  Upper Arm Region  Moderate depletion   Thoracic and Lumbar Region  Moderate depletion  Buccal Region  Severe depletion  Temple Region  No depletion  Clavicle Bone Region  No depletion  Clavicle and Acromion Bone Region  No depletion  Scapular Bone Region  No depletion  Dorsal Hand  No depletion  Patellar Region  Severe depletion [not ambulating]  Anterior Thigh Region  Severe depletion [not ambulating]  Posterior Calf Region  Severe depletion [not ambulating]  Edema (RD Assessment)  None  Hair  Reviewed  Eyes  Reviewed  Mouth  Reviewed  Skin  Reviewed  Nails  Reviewed     Diet Order:  Diet Heart Room service appropriate? Yes; Fluid consistency: Thin  EDUCATION NEEDS:   No education needs have been identified at this time  Skin:  Skin Assessment: Skin Integrity Issues:(hx right AKA; scattered ecchymosis)  Last BM:  06/22/2017  Height:   Ht Readings from Last 1 Encounters:  06/22/17 _0  (1.854 m)    Weight:   Wt Readings from Last 1 Encounters:  06/23/17 150 lb (68 kg)    Ideal Body Weight:  76.9 kg(adjusted for AKA)  BMI:  Body mass index is 19.79 kg/m.  Estimated Nutritional Needs:   Kcal:  1850-2160 (MSJ x 1.2-1.4)  Protein:  80-95 grams (1.2-1.4 grams/kg)  Fluid:  2 L/day (30 mL/kg)  Willey Blade, MS, RD, LDN Office: [phone] Pager: [phone] After Hours/Weekend Pager: [phone]

## 2017-06-23 NOTE — Progress Notes (Signed)
[place] [facility] [place] NAME: Tyler [name]    MR#:  [id number]  DATE OF BIRTH:  Oct 21, 1955  SUBJECTIVE:  CHIEF COMPLAINT:   Chief Complaint  Patient presents with  . Respiratory Distress   -Patient remains on 3 L oxygen this morning.  Baseline of 2 L.  Feels some better. -Coughing up yellowish productive sputum today.  REVIEW OF SYSTEMS:  Review of Systems  Constitutional: Positive for malaise/fatigue. Negative for chills and fever.  HENT: Negative for congestion, ear discharge, hearing loss, nosebleeds and sinus pain.   Respiratory: Positive for cough, sputum production and shortness of breath. Negative for wheezing.   Cardiovascular: Negative for chest pain, palpitations and leg swelling.  Gastrointestinal: Negative for abdominal pain, constipation, diarrhea, nausea and vomiting.  Genitourinary: Negative for dysuria.  Musculoskeletal: Positive for myalgias.  Neurological: Negative for dizziness, speech change, focal weakness, seizures and headaches.  Psychiatric/Behavioral: Negative for depression.    DRUG ALLERGIES:   Allergies  Allergen Reactions  . Apixaban Rash  . Rivaroxaban Rash    VITALS:  Blood pressure (!) 103/51, pulse 77, temperature 97.9 F (36.6 C), temperature source Oral, resp. rate 20, height 6\' 1"  (1.854 m), weight 68 kg (150 lb), SpO2 98 %.  PHYSICAL EXAMINATION:  Physical Exam  GENERAL:  63 y.o.-year-old patient lying in the bed with no acute distress.  EYES: Pupils equal, round, reactive to light and accommodation. No scleral icterus. Extraocular muscles intact.  HEENT: Head atraumatic, normocephalic. Oropharynx and nasopharynx clear.  NECK:  Supple, no jugular venous distention. No thyroid enlargement, no tenderness.  LUNGS: Scant breath sounds posteriorly with scattered expiratory wheezing anteriorly and at the bases posteriorly.  No crackles, no rhonchi.  No use of accessory muscles of breathing  today CARDIOVASCULAR: S1, S2 normal. No murmurs, rubs, or gallops.  ABDOMEN: Soft, nontender, nondistended. Bowel sounds present. No organomegaly or mass.  EXTREMITIES: No cyanosis, or clubbing.  Patient is status post right AKA NEUROLOGIC: Cranial nerves II through XII are intact. Muscle strength 5/5 in all extremities. Sensation intact. Gait not checked.  PSYCHIATRIC: The patient is alert and oriented x 3.  SKIN: No obvious rash, lesion, or ulcer.    LABORATORY PANEL:   CBC Recent Labs  Lab 06/23/17 0447  WBC 16.3*  HGB 11.8*  HCT 34.8*  PLT 266   ------------------------------------------------------------------------------------------------------------------  Chemistries  Recent Labs  Lab 06/23/17 0447  NA 131*  K 4.0  CL 98*  CO2 25  GLUCOSE 126*  BUN 11  CREATININE 0.76  CALCIUM 9.1   ------------------------------------------------------------------------------------------------------------------  Cardiac Enzymes Recent Labs  Lab 06/22/17 1138  TROPONINI <0.03   ------------------------------------------------------------------------------------------------------------------  RADIOLOGY:  Dg Chest 2 View  Result Date: 06/22/2017 CLINICAL DATA:  COPD, CHF, shortness of breath EXAM: CHEST  2 VIEW COMPARISON:  05/19/2017 FINDINGS: Lungs are clear.  No pleural effusion or pneumothorax. The heart is normal in size.  Left subclavian ICD. Visualized osseous structures are within normal limits, noting an old left posterolateral rib fracture deformity. IMPRESSION: No evidence of acute cardiopulmonary disease. Electronically Signed   By: Tyler [name] M.D.   On: 06/22/2017 12:08    EKG:   Orders placed or performed during the hospital encounter of 06/22/17  . ED EKG  . ED EKG  . EKG 12-Lead  . 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  1.  Acute on chronic hypoxic respiratory failure-secondary to COPD exacerbation with bronchitis and also influenza illness. -Continue IV steroids and nebulizer treatments -On Tamiflu for his influenza -Due to his thick productive sputum, we will add antibiotics.  On Rocephin and azithromycin. -Chronically on 2 L oxygen, was on 4 L this morning.  Wean as tolerated.  2.  Positive blood cultures-1 out of 4 bottles positive for Staphylococcus.  Could be contaminant. -Patient currently on Rocephin.  Will continue  3.  Paroxysmal atrial fibrillation-rate controlled.  On metoprolol -Continue Coumadin for anticoagulation.  INR is therapeutic.  4.  Chronic systolic CHF-no signs of exacerbation.  On metoprolol and ramipril.  Lasix is as needed  5.  Peripheral vascular disease-status post right AKA and recent stent placement of the left distal SFA.  Outpatient follow-up with vascular recommended.  Already on Coumadin  6.  DVT prophylaxis-on Coumadin.   All the records are reviewed and case discussed with Care Management/Social Workerr. Management plans discussed with the patient, family and they are in agreement.  CODE STATUS: Full code  TOTAL TIME TAKING CARE OF THIS PATIENT: 38 minutes.   POSSIBLE D/C IN 2 DAYS, DEPENDING ON CLINICAL CONDITION.   Tyler [name] M.D on 06/23/2017 at 2:33 PM  Between 7am to 6pm - Pager - [phone]  After 6pm go to [email or web address] - password [email or web address] [facility]  Office  [phone]  CC: Primary care physician; Tyler Hire, MD

## 2017-06-24 LAB — BASIC METABOLIC PANEL
Anion gap: 11 (ref 5–15)
BUN: 17 mg/dL (ref 6–20)
CO2: 25 mmol/L (ref 22–32)
Calcium: 9.2 mg/dL (ref 8.9–10.3)
Chloride: 97 mmol/L — ABNORMAL LOW (ref 101–111)
Creatinine, Ser: 0.92 mg/dL (ref 0.61–1.24)
GFR calc Af Amer: >60 mL/min (ref >60)
GFR calc non Af Amer: >60 mL/min (ref >60)
Glucose, Bld: 129 mg/dL — ABNORMAL HIGH (ref 65–99)
POTASSIUM: 4 mmol/L (ref 3.5–5.1)
Sodium: 133 mmol/L — ABNORMAL LOW (ref 135–145)

## 2017-06-24 LAB — BLOOD GAS, VENOUS
ACID-BASE EXCESS: 1.9 mmol/L (ref 0.0–2.0)
Bicarbonate: 29.4 mmol/L — ABNORMAL HIGH (ref 20.0–28.0)
FIO2: 0.21
PATIENT TEMPERATURE: 37
PCO2 VEN: 57 mmHg (ref 44.0–60.0)
PH VEN: 7.32 (ref 7.250–7.430)

## 2017-06-24 LAB — CULTURE, RESPIRATORY W GRAM STAIN: Culture: NORMAL

## 2017-06-24 LAB — HIV ANTIBODY (ROUTINE TESTING W REFLEX): HIV Screen 4th Generation wRfx: NONREACTIVE

## 2017-06-24 LAB — CULTURE, RESPIRATORY: SPECIAL REQUESTS: NORMAL

## 2017-06-24 LAB — PROTIME-INR
INR: 2.66
Prothrombin Time: 28.1 seconds — ABNORMAL HIGH (ref 11.4–15.2)

## 2017-06-24 MED ORDER — IPRATROPIUM-ALBUTEROL 0.5-2.5 (3) MG/3ML IN SOLN
3.0000 mL | Freq: Three times a day (TID) | RESPIRATORY_TRACT | Status: DC
  Administered 2017-06-25: 3 mL via RESPIRATORY_TRACT
  Filled 2017-06-24: qty 3

## 2017-06-24 MED ORDER — ALBUTEROL SULFATE (2.5 MG/3ML) 0.083% IN NEBU
2.5000 mg | INHALATION_SOLUTION | RESPIRATORY_TRACT | Status: DC | PRN
  Filled 2017-06-24: qty 3

## 2017-06-24 MED ORDER — RAMIPRIL 2.5 MG PO CAPS
2.5000 mg | ORAL_CAPSULE | Freq: Every day | ORAL | Status: DC
  Administered 2017-06-24: 2.5 mg via ORAL
  Filled 2017-06-24 (×2): qty 1

## 2017-06-24 NOTE — Progress Notes (Signed)
ANTICOAGULATION CONSULT NOTE - FOLLOW UP   Pharmacy Consult for warfarin Indication: atrial fibrillation  Allergies  Allergen Reactions  . Apixaban Rash  . Rivaroxaban Rash    Patient Measurements: Height: _0  (185.4 cm) Weight: 195 lb 8.8 oz (88.7 kg) IBW/kg (Calculated) : 79.9 Heparin Dosing Weight:   Vital Signs: Temp: 98.4 F (36.9 C) (01/31 0354) Temp Source: Oral (01/31 0354) BP: 96/53 (01/31 0354) Pulse Rate: 75 (01/31 0354)  Labs: Recent Labs    06/22/17 1138 06/23/17 0447 06/24/17 0332  HGB 13.0 11.8*  --   HCT 39.0* 34.8*  --   PLT 308 266  --   LABPROT 23.1* 26.6* 28.1*  INR 2.07 2.48 2.66  CREATININE 0.96 0.76 0.92  TROPONINI <0.03  --   --     Estimated Creatinine Clearance: 95.3 mL/min (by C-G formula based on SCr of 0.92 mg/dL).   Medical History: Past Medical History:  Diagnosis Date  . AICD (automatic cardioverter/defibrillator) present   . Atrial fibrillation ([place])   . CHF (congestive heart failure) ([place])   . COPD (chronic obstructive pulmonary disease) ([place])   . Coronary artery disease   . Depression   . Dyspnea   . GERD (gastroesophageal reflux disease)   . Hypertension   . Myocardial infarction ([place])   . Peripheral vascular disease ([place])   . Presence of permanent cardiac pacemaker     Medications:  Medications Prior to Admission  Medication Sig Dispense Refill Last Dose  . ALPRAZolam (XANAX) 1 MG tablet Take 1 mg by mouth 2 (two) times daily as needed.    06/22/2017 at Unknown time  . aspirin EC 81 MG tablet Take 1 tablet (81 mg total) by mouth daily. (Patient taking differently: Take 81 mg by mouth daily at 2 PM. (midday)) 150 tablet 2 06/22/2017 at Unknown time  . Cyanocobalamin (B-12 COMPLIANCE INJECTION) 1000 MCG/ML KIT Inject 1,000 mcg as directed once a week.     . docusate sodium (COLACE) 100 MG capsule Take 100 mg by mouth at bedtime as needed (for constipation.).    06/22/2017 at Unknown time  . fluticasone (FLONASE) 50  MCG/ACT nasal spray Place 2 sprays into both nostrils daily.   06/22/2017 at Unknown time  . fluticasone-salmeterol (ADVAIR HFA) 115-21 MCG/ACT inhaler USE 2 INHALATIONS ORALLY   EVERY 12 HOURS 12 g 5 06/22/2017 at Unknown time  . furosemide (LASIX) 20 MG tablet Take 10 mg by mouth daily as needed (for fluid retention.).    06/22/2017 at Unknown time  . guaiFENesin (MUCINEX) 600 MG 12 hr tablet Take 600 mg by mouth 2 (two) times daily.   06/22/2017 at Unknown time  . ipratropium (ATROVENT) 0.03 % nasal spray Place 2 sprays into both nostrils 2 (two) times daily.    06/22/2017 at Unknown time  . Ipratropium-Albuterol (COMBIVENT RESPIMAT) 20-100 MCG/ACT AERS respimat Inhale 1 puff into the lungs every 4 (four) hours.    06/22/2017 at Unknown time  . levalbuterol (XOPENEX) 1.25 MG/3ML nebulizer solution Take 1.25 mg (3 mLs total) by nebulization every 4 (four) hours. (Patient taking differently: Take 3 mLs by nebulization every 4 (four) hours as needed for wheezing or shortness of breath ((typically twice daily)). ) 72 mL 12 06/22/2017 at prn  . loratadine (CLARITIN) 10 MG tablet Take 10 mg by mouth at bedtime.   06/22/2017 at Unknown time  . lovastatin (MEVACOR) 20 MG tablet Take 20 mg by mouth at bedtime.   06/21/2017 at Unknown time  . metoprolol  succinate (TOPROL-XL) 25 MG 24 hr tablet Take 25 mg by mouth daily.   06/22/2017 at Unknown time  . pantoprazole (PROTONIX) 40 MG tablet Take 40 mg by mouth daily before breakfast.    06/22/2017 at Unknown time  . ramipril (ALTACE) 10 MG capsule Take 10 mg by mouth at bedtime.    06/21/2017 at Unknown time  . Tiotropium Bromide Monohydrate (SPIRIVA RESPIMAT) 1.25 MCG/ACT AERS Inhale 2 puffs into the lungs daily. 12 g 2 06/22/2017 at Unknown time  . warfarin (COUMADIN) 5 MG tablet Take 5 mg by mouth at bedtime.    06/21/2017 at Unknown time  . acetaminophen (TYLENOL) 500 MG tablet Take 1,000 mg by mouth daily as needed for moderate pain or headache.   prn at prn  .  nitroGLYCERIN (NITROSTAT) 0.4 MG SL tablet Place 0.4 mg under the tongue every 5 (five) minutes as needed for chest pain.   prn at prn  . nystatin ointment (MYCOSTATIN) Apply 1 application topically at bedtime. Applied to corners of mouth  0 prn at prn  . predniSONE (STERAPRED UNI-PAK 21 TAB) 10 MG (21) TBPK tablet Take as directed per taper (Patient not taking: Reported on 06/07/2017) 21 tablet 0 Not Taking at Unknown time  . simethicone (MYLICON) 80 MG chewable tablet Chew 1 tablet (80 mg total) by mouth 4 (four) times daily. (Patient not taking: Reported on 06/22/2017) 30 tablet 0 Not Taking at Unknown time   Scheduled:  . ALPRAZolam  1 mg Oral QHS  . aspirin EC  81 mg Oral Q1400  . azithromycin  500 mg Oral Daily  . fluticasone  2 spray Each Nare Daily  . guaiFENesin  600 mg Oral BID  . ipratropium-albuterol  3 mL Nebulization Q4H  . loratadine  10 mg Oral QHS  . methylPREDNISolone (SOLU-MEDROL) injection  60 mg Intravenous Q8H  . metoprolol succinate  25 mg Oral Daily  . mometasone-formoterol  2 puff Inhalation BID  . multivitamin with minerals  1 tablet Oral Daily  . oseltamivir  75 mg Oral BID  . pantoprazole  40 mg Oral QAC breakfast  . pravastatin  40 mg Oral q1800  . protein supplement shake  11 oz Oral Q24H  . ramipril  2.5 mg Oral QHS  . tiotropium  1 capsule Inhalation Daily  . warfarin  5 mg Oral q1800  . Warfarin - Pharmacist Dosing Inpatient   Does not apply q1800    Assessment: Pharmacy consulted to dose and monitor warfarin in this 62 year old male being treated for atrial fibrillation. Home dose: 5 mg daily                         INR               DOSE  1/29                2.07                5 mg  1/30:               2.48                5 mg  1/31                2.60   Goal of Therapy:  INR 2-3 Monitor platelets by anticoagulation protocol: Yes   Plan:  Will continue home dose of warfarin 5 mg. Will recheck INR  with am labs.,   Arlesia Kiel  D 06/24/2017,9:23 AM

## 2017-06-24 NOTE — Progress Notes (Signed)
[facility] [place] NAME: Aleph Nickson    MR#:  [id number]  DATE OF BIRTH:  03/06/1956  SUBJECTIVE:  CHIEF COMPLAINT:   Chief Complaint  Patient presents with  . Respiratory Distress   -feels some better, still has some wheezing. Remains on 3 L oxygen whereas at home and he is  on 2 L chronic o2  REVIEW OF SYSTEMS:  Review of Systems  Constitutional: Positive for malaise/fatigue. Negative for chills and fever.  HENT: Negative for congestion, ear discharge, hearing loss, nosebleeds and sinus pain.   Respiratory: Positive for cough, sputum production and shortness of breath. Negative for wheezing.   Cardiovascular: Negative for chest pain, palpitations and leg swelling.  Gastrointestinal: Negative for abdominal pain, constipation, diarrhea, nausea and vomiting.  Genitourinary: Negative for dysuria.  Musculoskeletal: Positive for myalgias.  Neurological: Negative for dizziness, speech change, focal weakness, seizures and headaches.  Psychiatric/Behavioral: Negative for depression.    DRUG ALLERGIES:   Allergies  Allergen Reactions  . Apixaban Rash  . Rivaroxaban Rash    VITALS:  Blood pressure (!) 96/53, pulse 75, temperature 98.4 F (36.9 C), temperature source Oral, resp. rate 18, height 6\' 1"  (1.854 m), weight 88.7 kg (195 lb 8.8 oz), SpO2 96 %.  PHYSICAL EXAMINATION:  Physical Exam  GENERAL:  62 y.o.-year-old patient lying in the bed with no acute distress.  EYES: Pupils equal, round, reactive to light and accommodation. No scleral icterus. Extraocular muscles intact.  HEENT: Head atraumatic, normocephalic. Oropharynx and nasopharynx clear.  NECK:  Supple, no jugular venous distention. No thyroid enlargement, no tenderness.  LUNGS: Scant breath sounds posteriorly with scattered expiratory wheezing anteriorly and at the bases posteriorly.  No crackles, no rhonchi.  No use of accessory muscles of breathing  today CARDIOVASCULAR: S1, S2 normal. No murmurs, rubs, or gallops.  ABDOMEN: Soft, nontender, nondistended. Bowel sounds present. No organomegaly or mass.  EXTREMITIES: No cyanosis, or clubbing.  Patient is status post right AKA Left foot warm to touch and has dorsalis pedis palpable NEUROLOGIC: Cranial nerves II through XII are intact. Muscle strength 5/5 in all extremities. Sensation intact. Gait not checked.  PSYCHIATRIC: The patient is alert and oriented x 3.  SKIN: No obvious rash, lesion, or ulcer.    LABORATORY PANEL:   CBC Recent Labs  Lab 06/23/17 0447  WBC 16.3*  HGB 11.8*  HCT 34.8*  PLT 266   ------------------------------------------------------------------------------------------------------------------  Chemistries  Recent Labs  Lab 06/24/17 0332  NA 133*  K 4.0  CL 97*  CO2 25  GLUCOSE 129*  BUN 17  CREATININE 0.92  CALCIUM 9.2   ------------------------------------------------------------------------------------------------------------------  Cardiac Enzymes Recent Labs  Lab 06/22/17 1138  TROPONINI <0.03   ------------------------------------------------------------------------------------------------------------------  RADIOLOGY:  Dg Chest 2 View  Result Date: 06/22/2017 CLINICAL DATA:  COPD, CHF, shortness of breath EXAM: CHEST  2 VIEW COMPARISON:  05/19/2017 FINDINGS: Lungs are clear.  No pleural effusion or pneumothorax. The heart is normal in size.  Left subclavian ICD. Visualized osseous structures are within normal limits, noting an old left posterolateral rib fracture deformity. IMPRESSION: No evidence of acute cardiopulmonary disease. Electronically Signed   By: Julian Hy M.D.   On: 06/22/2017 12:08    EKG:   Orders placed or performed during the hospital encounter of 06/22/17  . ED EKG  . ED EKG  . EKG 12-Lead  . EKG 12-Lead    ASSESSMENT AND PLAN:   62 year old male with past medical history  significant for paroxysmal  atrial fibrillation and factor V Leyden mutation on Coumadin, chronic respiratory failure secondary to COPD on 2 L home oxygen, chronic systolic CHF with EF of 98%, CAD, status post pacemaker and defibrillator presents to hospital secondary to worsening shortness of breath.  1.  Acute on chronic hypoxic respiratory failure-secondary to COPD exacerbation with bronchitis and also influenza illness. -Continue IV steroids and nebulizer treatments -On Tamiflu for his influenza -Due to his thick productive sputum, added antibiotics.  On Rocephin and azithromycin. -Chronically on 2 L oxygen,   on 3 L this morning.  Wean as tolerated.  2.  Positive blood cultures-1 out of 4 bottles positive for Staphylococcus.  Could be contaminant. -Patient currently on Rocephin.  Will continue  3.  Paroxysmal atrial fibrillation-rate controlled.  On metoprolol -Continue Coumadin for anticoagulation.  INR is therapeutic.  4.  Chronic systolic CHF-no signs of exacerbation.  On metoprolol and ramipril.  Lasix is as needed Soft BP, so decreased ramipril dose  5.  Peripheral vascular disease-status post right AKA and recent stent placement of the left distal SFA.  Outpatient follow-up with vascular recommended.  Already on Coumadin  6.  DVT prophylaxis-on Coumadin.   All the records are reviewed and case discussed with Care Management/Social Workerr. Management plans discussed with the patient, family and they are in agreement.  CODE STATUS: Full code  TOTAL TIME TAKING CARE OF THIS PATIENT: 36 minutes.   POSSIBLE D/C tomorrow, DEPENDING ON CLINICAL CONDITION.   Gladstone Lighter M.D on 06/24/2017 at 9:30 AM  Between 7am to 6pm - Pager - [phone]  After 6pm go to [email or web address] - password [email or web address] [facility]  Office  [phone]  CC: Primary care physician; [name], MD

## 2017-06-25 ENCOUNTER — Ambulatory Visit: Payer: Medicare Other

## 2017-06-25 LAB — PROTIME-INR
INR: 2.5
Prothrombin Time: 26.8 seconds — ABNORMAL HIGH (ref 11.4–15.2)

## 2017-06-25 MED ORDER — OSELTAMIVIR PHOSPHATE 75 MG PO CAPS: 75.0000 mg | ORAL_CAPSULE | Freq: Two times a day (BID) | ORAL | 0 refills | Status: AC

## 2017-06-25 MED ORDER — PREDNISONE 20 MG PO TABS: ORAL_TABLET | ORAL | 0 refills | Status: DC

## 2017-06-25 MED ORDER — HYDROCOD POLST-CPM POLST ER 10-8 MG/5ML PO SUER: 5.0000 mL | Freq: Two times a day (BID) | ORAL | Status: DC

## 2017-06-25 MED ORDER — CEFUROXIME AXETIL 500 MG PO TABS: 500.0000 mg | ORAL_TABLET | Freq: Two times a day (BID) | ORAL | 0 refills | Status: AC

## 2017-06-25 MED ORDER — HYDROCOD POLST-CPM POLST ER 10-8 MG/5ML PO SUER: 5.0000 mL | Freq: Two times a day (BID) | ORAL | 0 refills | Status: DC

## 2017-06-25 NOTE — Progress Notes (Addendum)
Patient being discharged home. Wife at bedside and will be transporting him. Patient is on oxygen at home and will continue that. AVS reviewed and all questions answered. Patient left using home portable oxygen tank.

## 2017-06-25 NOTE — Discharge Summary (Signed)
 [facility]   PATIENT NAME: Tyler [name]    MR#:  [id number]  DATE OF BIRTH:  11/26/1955  DATE OF ADMISSION:  06/22/2017   ADMITTING PHYSICIAN: Sital Mody, MD  DATE OF DISCHARGE: 06/25/17  PRIMARY CARE PHYSICIAN: Johnston, John D, MD   ADMISSION DIAGNOSIS:   Influenza A [J10.1] COPD exacerbation (HCC) [J44.1]  DISCHARGE DIAGNOSIS:   Active Problems:   COPD exacerbation (HCC)   SECONDARY DIAGNOSIS:   Past Medical History:  Diagnosis Date  . AICD (automatic cardioverter/defibrillator) present   . Atrial fibrillation (HCC)   . CHF (congestive heart failure) (HCC)   . COPD (chronic obstructive pulmonary disease) (HCC)   . Coronary artery disease   . Depression   . Dyspnea   . GERD (gastroesophageal reflux disease)   . Hypertension   . Myocardial infarction (HCC)   . Peripheral vascular disease (HCC)   . Presence of permanent cardiac pacemaker     HOSPITAL COURSE:   62-year-old male with past medical history significant for paroxysmal atrial fibrillation and factor V Leyden mutation on Coumadin, chronic respiratory failure secondary to COPD on 2 L home oxygen, chronic systolic CHF with EF of 35%, CAD, status post pacemaker and defibrillator presents to hospital secondary to worsening shortness of breath.  1.  Acute on chronic hypoxic respiratory failure-secondary to COPD exacerbation with bronchitis and also influenza illness. -received IV steroids and nebulizer treatments -On Tamiflu for his influenza- stop after 5 days total -Due to his thick productive sputum, added antibiotics. was On Rocephin and azithromycin in the hospital- discharge on oral ceftin for 6 more days -Chronically on 2 L oxygen - continue home inhalers and nebulizer  2.  Positive blood cultures-1 out of 4 bottles positive for coagulase negative Staphylococcus. It is a contaminant  3.  Paroxysmal atrial fibrillation-rate controlled.  On  metoprolol -Continue Coumadin for anticoagulation.  INR is therapeutic.  4.  Chronic systolic CHF-no signs of exacerbation.  On metoprolol and ramipril.  Lasix is as needed  5.  Peripheral vascular disease-status post right AKA and recent stent placement of the left distal SFA.  Outpatient follow-up with vascular recommended.  Already on Coumadin  6. Has cervical spinal stenosis- with chronic parasthesias of his arms, tips of fingers Not a surgical candidate per neurosurgery at this time. Follow as recommended as outpatient  Feels close to baseline, will be discharged home    DISCHARGE CONDITIONS:   Guarded  CONSULTS OBTAINED:   None  DRUG ALLERGIES:   Allergies  Allergen Reactions  . Apixaban Rash  . Rivaroxaban Rash   DISCHARGE MEDICATIONS:   Allergies as of 06/25/2017      Reactions   Apixaban Rash   Rivaroxaban Rash      Medication List    STOP taking these medications   predniSONE 10 MG (21) Tbpk tablet Commonly known as:  STERAPRED UNI-PAK 21 TAB Replaced by:  predniSONE 20 MG tablet   simethicone 80 MG chewable tablet Commonly known as:  MYLICON     TAKE these medications   acetaminophen 500 MG tablet Commonly known as:  TYLENOL Take 1,000 mg by mouth daily as needed for moderate pain or headache.   ALPRAZolam 1 MG tablet Commonly known as:  XANAX Take 1 mg by mouth 2 (two) times daily as needed.   aspirin EC 81 MG tablet Take 1 tablet (81 mg total) by mouth daily. What changed:    when to take this  additional   instructions   B-12 COMPLIANCE INJECTION 1000 MCG/ML Kit Generic drug:  Cyanocobalamin Inject 1,000 mcg as directed once a week.   cefUROXime 500 MG tablet Commonly known as:  CEFTIN Take 1 tablet (500 mg total) by mouth 2 (two) times daily for 6 days.   chlorpheniramine-HYDROcodone 10-8 MG/5ML Suer Commonly known as:  TUSSIONEX Take 5 mLs by mouth every 12 (twelve) hours.   COMBIVENT RESPIMAT 20-100 MCG/ACT Aers  respimat Generic drug:  Ipratropium-Albuterol Inhale 1 puff into the lungs every 4 (four) hours.   docusate sodium 100 MG capsule Commonly known as:  COLACE Take 100 mg by mouth at bedtime as needed (for constipation.).   fluticasone 50 MCG/ACT nasal spray Commonly known as:  FLONASE Place 2 sprays into both nostrils daily.   fluticasone-salmeterol 115-21 MCG/ACT inhaler Commonly known as:  ADVAIR HFA USE 2 INHALATIONS ORALLY   EVERY 12 HOURS   furosemide 20 MG tablet Commonly known as:  LASIX Take 10 mg by mouth daily as needed (for fluid retention.).   guaiFENesin 600 MG 12 hr tablet Commonly known as:  MUCINEX Take 600 mg by mouth 2 (two) times daily.   ipratropium 0.03 % nasal spray Commonly known as:  ATROVENT Place 2 sprays into both nostrils 2 (two) times daily.   levalbuterol 1.25 MG/3ML nebulizer solution Commonly known as:  XOPENEX Take 1.25 mg (3 mLs total) by nebulization every 4 (four) hours. What changed:    when to take this  reasons to take this   loratadine 10 MG tablet Commonly known as:  CLARITIN Take 10 mg by mouth at bedtime.   lovastatin 20 MG tablet Commonly known as:  MEVACOR Take 20 mg by mouth at bedtime.   metoprolol succinate 25 MG 24 hr tablet Commonly known as:  TOPROL-XL Take 25 mg by mouth daily.   nitroGLYCERIN 0.4 MG SL tablet Commonly known as:  NITROSTAT Place 0.4 mg under the tongue every 5 (five) minutes as needed for chest pain.   nystatin ointment Commonly known as:  MYCOSTATIN Apply 1 application topically at bedtime. Applied to corners of mouth   oseltamivir 75 MG capsule Commonly known as:  TAMIFLU Take 1 capsule (75 mg total) by mouth 2 (two) times daily for 2 days.   pantoprazole 40 MG tablet Commonly known as:  PROTONIX Take 40 mg by mouth daily before breakfast.   predniSONE 20 MG tablet Commonly known as:  DELTASONE Take 2 tablets of 10m daily for 5 days followed by 1 tablet of 253mdaily for 5 days  and stop Replaces:  predniSONE 10 MG (21) Tbpk tablet   ramipril 10 MG capsule Commonly known as:  ALTACE Take 10 mg by mouth at bedtime.   Tiotropium Bromide Monohydrate 1.25 MCG/ACT Aers Commonly known as:  SPIRIVA RESPIMAT Inhale 2 puffs into the lungs daily.   warfarin 5 MG tablet Commonly known as:  COUMADIN Take 5 mg by mouth at bedtime.        DISCHARGE INSTRUCTIONS:   1. PCP follow-up in 1-2 weeks 2. Keep up your outpatient appointments as prior scheduled with vascular, neurology and neurosurgery  DIET:   Cardiac diet  ACTIVITY:   Activity as tolerated  OXYGEN:   Home Oxygen: Yes Oxygen Delivery: 2-3 liters/min via Patient connected to nasal cannula oxygen  DISCHARGE LOCATION:   home   If you experience worsening of your admission symptoms, develop shortness of breath, life threatening emergency, suicidal or homicidal thoughts you must seek medical attention immediately by calling  911 or calling your MD immediately  if symptoms less severe.  You Must read complete instructions/literature along with all the possible adverse reactions/side effects for all the Medicines you take and that have been prescribed to you. Take any new Medicines after you have completely understood and accpet all the possible adverse reactions/side effects.   Please note  You were cared for by a hospitalist during your hospital stay. If you have any questions about your discharge medications or the care you received while you were in the hospital after you are discharged, you can call the unit and asked to speak with the hospitalist on call if the hospitalist that took care of you is not available. Once you are discharged, your primary care physician will handle any further medical issues. Please note that NO REFILLS for any discharge medications will be authorized once you are discharged, as it is imperative that you return to your primary care physician (or establish a relationship with  a primary care physician if you do not have one) for your aftercare needs so that they can reassess your need for medications and monitor your lab values.    On the day of Discharge:  VITAL SIGNS:   Blood pressure (!) 153/58, pulse 78, temperature 98 F (36.7 C), temperature source Oral, resp. rate 18, height 6' 1" (1.854 m), weight 86.2 kg (190 lb), SpO2 100 %.  PHYSICAL EXAMINATION:    GENERAL:  61 y.o.-year-old patient lying in the bed with no acute distress.  EYES: Pupils equal, round, reactive to light and accommodation. No scleral icterus. Extraocular muscles intact.  HEENT: Head atraumatic, normocephalic. Oropharynx and nasopharynx clear.  NECK:  Supple, no jugular venous distention. No thyroid enlargement, no tenderness.  LUNGS: Scant breath sounds posteriorly with scattered expiratory wheezing anteriorly and at the bases posteriorly.  No crackles, no rhonchi.  No use of accessory muscles of breathing today CARDIOVASCULAR: S1, S2 normal. No murmurs, rubs, or gallops.  ABDOMEN: Soft, nontender, nondistended. Bowel sounds present. No organomegaly or mass.  EXTREMITIES: No cyanosis, or clubbing.  Patient is status post right AKA Left foot warm to touch and has dorsalis pedis palpable NEUROLOGIC: Cranial nerves II through XII are intact. Muscle strength 5/5 in all extremities. Sensation intact. Gait not checked.  PSYCHIATRIC: The patient is alert and oriented x 3.  SKIN: No obvious rash, lesion, or ulcer.      DATA REVIEW:   CBC Recent Labs  Lab 06/23/17 0447  WBC 16.3*  HGB 11.8*  HCT 34.8*  PLT 266    Chemistries  Recent Labs  Lab 06/24/17 0332  NA 133*  K 4.0  CL 97*  CO2 25  GLUCOSE 129*  BUN 17  CREATININE 0.92  CALCIUM 9.2     Microbiology Results  Results for orders placed or performed during the hospital encounter of 06/22/17  Blood culture (routine x 2)     Status: None (Preliminary result)   Collection Time: 06/22/17 11:33 AM  Result Value Ref  Range Status   Specimen Description BLOOD LFOA  Final   Special Requests   Final    BOTTLES DRAWN AEROBIC AND ANAEROBIC Blood Culture adequate volume   Culture   Final    NO GROWTH 3 DAYS Performed at  Hospital Lab, [address] [place], [place] [address]    Report Status PENDING  Incomplete  Blood culture (routine x 2)     Status: Abnormal (Preliminary result)   Collection Time: 06/22/17 11:38 AM  Result   Value Ref Range Status   Specimen Description   Final    BLOOD LEFT ANTECUBITAL Performed at [facility], [place] [address], [place], [place] [address]    Special Requests   Final    BOTTLES DRAWN AEROBIC AND ANAEROBIC Blood Culture adequate volume Performed at [facility], [address] [place], [place] [address]    Culture  Setup Time   Final    GRAM POSITIVE COCCI IN BOTH AEROBIC AND ANAEROBIC BOTTLES CRITICAL RESULT CALLED TO, READ BACK BY AND VERIFIED WITH: NATE COOKSON ON 06/23/2017 AT 0854 AKT    Culture (A)  Final    STAPHYLOCOCCUS SPECIES (COAGULASE NEGATIVE) THE SIGNIFICANCE OF ISOLATING THIS ORGANISM FROM A SINGLE SET OF BLOOD CULTURES WHEN MULTIPLE SETS ARE DRAWN IS UNCERTAIN. PLEASE NOTIFY THE MICROBIOLOGY DEPARTMENT WITHIN ONE WEEK IF SPECIATION AND SENSITIVITIES ARE REQUIRED. Performed at [facility], [place] [address] [place], [place] [address]    Report Status PENDING  Incomplete  Blood Culture ID Panel (Reflexed)     Status: Abnormal   Collection Time: 06/22/17 11:38 AM  Result Value Ref Range Status   Enterococcus species NOT DETECTED NOT DETECTED Final   Vancomycin resistance NOT DETECTED NOT DETECTED Final   Listeria monocytogenes NOT DETECTED NOT DETECTED Final   Staphylococcus species DETECTED (A) NOT DETECTED Final    Comment: Methicillin (oxacillin) resistant coagulase negative staphylococcus. Possible blood culture contaminant (unless isolated from more than one blood culture draw or clinical case suggests  pathogenicity). No antibiotic treatment is indicated for blood  culture contaminants. CRITICAL RESULT CALLED TO, READ BACK BY AND VERIFIED WITH: NATE COOKSON ON 06/23/17 AT 0854    Staphylococcus aureus NOT DETECTED NOT DETECTED Final   Methicillin resistance DETECTED (A) NOT DETECTED Final    Comment: CRITICAL RESULT CALLED TO, READ BACK BY AND VERIFIED WITH: NATE COOKSON ON 06/23/17 AT 0854 AKT    Streptococcus species NOT DETECTED NOT DETECTED Final   Streptococcus agalactiae NOT DETECTED NOT DETECTED Final   Streptococcus pneumoniae NOT DETECTED NOT DETECTED Final   Streptococcus pyogenes NOT DETECTED NOT DETECTED Final   Acinetobacter baumannii NOT DETECTED NOT DETECTED Final   Enterobacteriaceae species NOT DETECTED NOT DETECTED Final   Enterobacter cloacae complex NOT DETECTED NOT DETECTED Final   Escherichia coli NOT DETECTED NOT DETECTED Final   Klebsiella oxytoca NOT DETECTED NOT DETECTED Final   Klebsiella pneumoniae NOT DETECTED NOT DETECTED Final   Proteus species NOT DETECTED NOT DETECTED Final   Serratia marcescens NOT DETECTED NOT DETECTED Final   Carbapenem resistance NOT DETECTED NOT DETECTED Final   Haemophilus influenzae NOT DETECTED NOT DETECTED Final   Neisseria meningitidis NOT DETECTED NOT DETECTED Final   Pseudomonas aeruginosa NOT DETECTED NOT DETECTED Final   Candida albicans NOT DETECTED NOT DETECTED Final   Candida glabrata NOT DETECTED NOT DETECTED Final   Candida krusei NOT DETECTED NOT DETECTED Final   Candida parapsilosis NOT DETECTED NOT DETECTED Final   Candida tropicalis NOT DETECTED NOT DETECTED Final    Comment: Performed at [facility], [address] [place], [place] [address]  Culture, expectorated sputum-assessment     Status: None   Collection Time: 06/22/17  1:10 PM  Result Value Ref Range Status   Specimen Description EXPECTORATED SPUTUM  Final   Special Requests Normal  Final   Sputum evaluation   Final    THIS SPECIMEN IS  ACCEPTABLE FOR SPUTUM CULTURE Performed at [facility], [address] [place],  [place] [address]    Report Status 06/22/2017 FINAL  Final  Culture, respiratory (NON-Expectorated)     Status: None   Collection Time: 06/22/17  1:10 PM  Result Value Ref Range Status   Specimen Description   Final    EXPECTORATED SPUTUM Performed at [facility], [address] [place], [place] [address]    Special Requests   Final    Normal Reflexed from [id number] Performed at [facility], [address] [place], [place] [address]    Gram Stain   Final    ABUNDANT WBC PRESENT, PREDOMINANTLY PMN ABUNDANT GRAM POSITIVE COCCI RARE GRAM NEGATIVE RODS    Culture   Final    Consistent with normal respiratory flora. Performed at [facility], [address] [place], [place] [address]    Report Status 06/24/2017 FINAL  Final    RADIOLOGY:  No results found.   Management plans discussed with the patient, family and they are in agreement.  CODE STATUS:     Code Status Orders  (From admission, onward)        Start     Ordered   06/22/17 1842  Full code  Continuous     06/22/17 1841    Code Status History    Date Active Date Inactive Code Status Order ID Comments User Context   06/07/2017 09:30 06/07/2017 16:10 Full Code [id number]  [name], Jason S, MD Inpatient   10/14/2016 00:10 10/23/2016 14:45 Full Code [id number]  Willis, David, MD Inpatient      TOTAL TIME TAKING CARE OF THIS PATIENT: 38 minutes.    KALISETTI,RADHIKA M.D on 06/25/2017 at 11:46 AM  Between 7am to 6pm - Pager - [phone]  After 6pm go to [email or web address] - password [email or web address]  [facility]  Office  [phone]  CC: Primary care physician; Johnston, John D, MD   Note: This dictation was prepared with Dragon dictation along with smaller phrase technology. Any transcriptional errors that result from this process are unintentional.  

## 2017-06-25 NOTE — Progress Notes (Signed)
ANTICOAGULATION CONSULT NOTE - FOLLOW UP   Pharmacy Consult for warfarin Indication: atrial fibrillation  Allergies  Allergen Reactions  . Apixaban Rash  . Rivaroxaban Rash    Patient Measurements: Height: _0  (185.4 cm) Weight: 190 lb (86.2 kg) IBW/kg (Calculated) : 79.9 Heparin Dosing Weight:   Vital Signs: Temp: 98 F (36.7 C) (02/01 0908) Temp Source: Oral (02/01 0908) BP: 153/58 (02/01 0908) Pulse Rate: 78 (02/01 0908)  Labs: Recent Labs    06/22/17 1138 06/23/17 0447 06/24/17 0332 06/25/17 0308  HGB 13.0 11.8*  --   --   HCT 39.0* 34.8*  --   --   PLT 308 266  --   --   LABPROT 23.1* 26.6* 28.1* 26.8*  INR 2.07 2.48 2.66 2.50  CREATININE 0.96 0.76 0.92  --   TROPONINI <0.03  --   --   --     Estimated Creatinine Clearance: 95.3 mL/min (by C-G formula based on SCr of 0.92 mg/dL).   Medical History: Past Medical History:  Diagnosis Date  . AICD (automatic cardioverter/defibrillator) present   . Atrial fibrillation ([place])   . CHF (congestive heart failure) ([place])   . COPD (chronic obstructive pulmonary disease) ([place])   . Coronary artery disease   . Depression   . Dyspnea   . GERD (gastroesophageal reflux disease)   . Hypertension   . Myocardial infarction ([place])   . Peripheral vascular disease ([place])   . Presence of permanent cardiac pacemaker     Medications:  Medications Prior to Admission  Medication Sig Dispense Refill Last Dose  . ALPRAZolam (XANAX) 1 MG tablet Take 1 mg by mouth 2 (two) times daily as needed.    06/22/2017 at Unknown time  . aspirin EC 81 MG tablet Take 1 tablet (81 mg total) by mouth daily. (Patient taking differently: Take 81 mg by mouth daily at 2 PM. (midday)) 150 tablet 2 06/22/2017 at Unknown time  . Cyanocobalamin (B-12 COMPLIANCE INJECTION) 1000 MCG/ML KIT Inject 1,000 mcg as directed once a week.     . docusate sodium (COLACE) 100 MG capsule Take 100 mg by mouth at bedtime as needed (for constipation.).    06/22/2017 at  Unknown time  . fluticasone (FLONASE) 50 MCG/ACT nasal spray Place 2 sprays into both nostrils daily.   06/22/2017 at Unknown time  . fluticasone-salmeterol (ADVAIR HFA) 115-21 MCG/ACT inhaler USE 2 INHALATIONS ORALLY   EVERY 12 HOURS 12 g 5 06/22/2017 at Unknown time  . furosemide (LASIX) 20 MG tablet Take 10 mg by mouth daily as needed (for fluid retention.).    06/22/2017 at Unknown time  . guaiFENesin (MUCINEX) 600 MG 12 hr tablet Take 600 mg by mouth 2 (two) times daily.   06/22/2017 at Unknown time  . ipratropium (ATROVENT) 0.03 % nasal spray Place 2 sprays into both nostrils 2 (two) times daily.    06/22/2017 at Unknown time  . Ipratropium-Albuterol (COMBIVENT RESPIMAT) 20-100 MCG/ACT AERS respimat Inhale 1 puff into the lungs every 4 (four) hours.    06/22/2017 at Unknown time  . levalbuterol (XOPENEX) 1.25 MG/3ML nebulizer solution Take 1.25 mg (3 mLs total) by nebulization every 4 (four) hours. (Patient taking differently: Take 3 mLs by nebulization every 4 (four) hours as needed for wheezing or shortness of breath ((typically twice daily)). ) 72 mL 12 06/22/2017 at prn  . loratadine (CLARITIN) 10 MG tablet Take 10 mg by mouth at bedtime.   06/22/2017 at Unknown time  . lovastatin (MEVACOR) 20 MG  tablet Take 20 mg by mouth at bedtime.   06/21/2017 at Unknown time  . metoprolol succinate (TOPROL-XL) 25 MG 24 hr tablet Take 25 mg by mouth daily.   06/22/2017 at Unknown time  . pantoprazole (PROTONIX) 40 MG tablet Take 40 mg by mouth daily before breakfast.    06/22/2017 at Unknown time  . ramipril (ALTACE) 10 MG capsule Take 10 mg by mouth at bedtime.    06/21/2017 at Unknown time  . Tiotropium Bromide Monohydrate (SPIRIVA RESPIMAT) 1.25 MCG/ACT AERS Inhale 2 puffs into the lungs daily. 12 g 2 06/22/2017 at Unknown time  . warfarin (COUMADIN) 5 MG tablet Take 5 mg by mouth at bedtime.    06/21/2017 at Unknown time  . acetaminophen (TYLENOL) 500 MG tablet Take 1,000 mg by mouth daily as needed for moderate  pain or headache.   prn at prn  . nitroGLYCERIN (NITROSTAT) 0.4 MG SL tablet Place 0.4 mg under the tongue every 5 (five) minutes as needed for chest pain.   prn at prn  . nystatin ointment (MYCOSTATIN) Apply 1 application topically at bedtime. Applied to corners of mouth  0 prn at prn  . predniSONE (STERAPRED UNI-PAK 21 TAB) 10 MG (21) TBPK tablet Take as directed per taper (Patient not taking: Reported on 06/07/2017) 21 tablet 0 Not Taking at Unknown time  . simethicone (MYLICON) 80 MG chewable tablet Chew 1 tablet (80 mg total) by mouth 4 (four) times daily. (Patient not taking: Reported on 06/22/2017) 30 tablet 0 Not Taking at Unknown time   Scheduled:  . ALPRAZolam  1 mg Oral QHS  . aspirin EC  81 mg Oral Q1400  . azithromycin  500 mg Oral Daily  . fluticasone  2 spray Each Nare Daily  . guaiFENesin  600 mg Oral BID  . ipratropium-albuterol  3 mL Nebulization TID  . loratadine  10 mg Oral QHS  . methylPREDNISolone (SOLU-MEDROL) injection  60 mg Intravenous Q8H  . metoprolol succinate  25 mg Oral Daily  . mometasone-formoterol  2 puff Inhalation BID  . multivitamin with minerals  1 tablet Oral Daily  . oseltamivir  75 mg Oral BID  . pantoprazole  40 mg Oral QAC breakfast  . pravastatin  40 mg Oral q1800  . protein supplement shake  11 oz Oral Q24H  . ramipril  2.5 mg Oral QHS  . tiotropium  1 capsule Inhalation Daily  . warfarin  5 mg Oral q1800  . Warfarin - Pharmacist Dosing Inpatient   Does not apply q1800    Assessment: Pharmacy consulted to dose and monitor warfarin in this 62 year old male being treated for atrial fibrillation. Home dose: 5 mg daily                         INR               DOSE  1/29                2.07                5 mg  1/30:               2.48                5 mg  1/31                2.60  5 mg 2/1                  2.50  Goal of Therapy:  INR 2-3 Monitor platelets by anticoagulation protocol: Yes   Plan:  Will continue home dose of  warfarin 5 mg. Will recheck INR with am labs.,   Jermani Pund D 06/25/2017,9:50 AM

## 2017-06-26 LAB — CULTURE, BLOOD (ROUTINE X 2): Special Requests: ADEQUATE

## 2017-06-27 LAB — CULTURE, BLOOD (ROUTINE X 2)
Culture: NO GROWTH
SPECIAL REQUESTS: ADEQUATE

## 2017-06-29 ENCOUNTER — Ambulatory Visit (INDEPENDENT_AMBULATORY_CARE_PROVIDER_SITE_OTHER): Payer: 59 | Admitting: Vascular Surgery

## 2017-07-06 ENCOUNTER — Ambulatory Visit (INDEPENDENT_AMBULATORY_CARE_PROVIDER_SITE_OTHER): Payer: 59 | Admitting: Vascular Surgery

## 2017-07-13 ENCOUNTER — Ambulatory Visit (INDEPENDENT_AMBULATORY_CARE_PROVIDER_SITE_OTHER): Payer: 59 | Admitting: Vascular Surgery

## 2017-07-13 ENCOUNTER — Encounter (INDEPENDENT_AMBULATORY_CARE_PROVIDER_SITE_OTHER): Payer: Self-pay

## 2017-07-13 ENCOUNTER — Encounter (INDEPENDENT_AMBULATORY_CARE_PROVIDER_SITE_OTHER): Payer: Self-pay | Admitting: Vascular Surgery

## 2017-07-13 VITALS — BP 124/76 | HR 77 | Resp 18 | Ht 73.0 in | Wt 142.0 lb

## 2017-07-13 DIAGNOSIS — I5022 Chronic systolic (congestive) heart failure: Secondary | ICD-10-CM

## 2017-07-13 DIAGNOSIS — I739 Peripheral vascular disease, unspecified: Secondary | ICD-10-CM

## 2017-07-13 DIAGNOSIS — J441 Chronic obstructive pulmonary disease with (acute) exacerbation: Secondary | ICD-10-CM | POA: Diagnosis not present

## 2017-07-13 NOTE — Progress Notes (Signed)
Subjective:    Patient ID: Tyler [name], male    DOB: 1956/05/17, 62 y.o.   MRN: [id number] Chief Complaint  Patient presents with  . Follow-up    2 week leg angio f/u   Patient presents for his first post procedure follow-up.  The patient is status post an ultrasound guidance for vascular access left femoral artery, Catheter placement into left anterior tibial artery from left femoral approach, Selective left lower extremity angiogram, Percutaneous transluminal angioplasty of left SFA with 5 mm diameter by 22 cm length Lutonix drug-coated angioplasty balloon, Percutaneous transluminal angioplasty of the left anterior tibial artery with 2.5 mm diameter by 10 cm length angioplasty balloon, Stent placement to the distal SFA with 6 mm diameter by 6 cm length stent for greater than 50% residual stenosis after angioplasty just below the previously placed stent.  The patient tolerated the procedure well however significant disease at the common femoral bifurcation was found that is not be amenable to percutaneous treatment.  The patient is still experiencing left foot pain especially left big toe pain.  The patient denies any worsening rest pain or ulceration to the left lower extremity.  The patient was recently hospitalized for the flu and mild pneumonia.  The patient denies any fever, nausea vomiting.   Review of Systems  Constitutional: Negative.   HENT: Negative.   Eyes: Negative.   Respiratory: Negative.   Cardiovascular:       Left lower extremity pain  Gastrointestinal: Negative.   Endocrine: Negative.   Genitourinary: Negative.   Musculoskeletal: Negative.   Skin: Negative.   Allergic/Immunologic: Negative.   Neurological: Negative.   Hematological: Negative.   Psychiatric/Behavioral: Negative.       Objective:   Physical Exam  Constitutional: He is oriented to person, place, and time. He appears well-developed and well-nourished. No distress.  HENT:  Head: Normocephalic and  atraumatic.  Eyes: Conjunctivae are normal. Pupils are equal, round, and reactive to light.  Neck: Normal range of motion.  Cardiovascular: Normal rate, regular rhythm, normal heart sounds and intact distal pulses.  Pulses:      Radial pulses are 2+ on the right side, and 2+ on the left side.  Right above-the-knee amputation stump: Healed well Left lower extremity: Bluish tint to toes.  Skin is intact.  Unable to palpate pedal pulses  Pulmonary/Chest: Effort normal and breath sounds normal.  Musculoskeletal: Normal range of motion. He exhibits no edema.  Neurological: He is alert and oriented to person, place, and time.  Skin: He is not diaphoretic.  Psychiatric: He has a normal mood and affect. His behavior is normal. Judgment and thought content normal.  Vitals reviewed.  BP 124/76 (BP Location: Left Arm, Patient Position: Sitting)   Pulse 77   Resp 18   Ht 6' 1" (1.854 m)   Wt 142 lb (64.4 kg)   BMI 18.73 kg/m   Past Medical History:  Diagnosis Date  . AICD (automatic cardioverter/defibrillator) present   . Atrial fibrillation ([place])   . CHF (congestive heart failure) ([place])   . COPD (chronic obstructive pulmonary disease) ([place])   . Coronary artery disease   . Depression   . Dyspnea   . GERD (gastroesophageal reflux disease)   . Hypertension   . Myocardial infarction ([place])   . Peripheral vascular disease ([place])   . Presence of permanent cardiac pacemaker    Social History   Socioeconomic History  . Marital status: Married    Spouse name: Not on file  .  Number of children: Not on file  . Years of education: Not on file  . Highest education level: Not on file  Social Needs  . Financial resource strain: Not on file  . Food insecurity - worry: Not on file  . Food insecurity - inability: Not on file  . Transportation needs - medical: Not on file  . Transportation needs - non-medical: Not on file  Occupational History  . Not on file  Tobacco Use  . Smoking status:  Former Smoker    Packs/day: 1.00    Years: 42.00    Pack years: 42.00    Types: Cigarettes    Last attempt to quit: 09/23/2013    Years since quitting: 3.8  . Smokeless tobacco: Never Used  Substance and Sexual Activity  . Alcohol use: No  . Drug use: No  . Sexual activity: Not on file  Other Topics Concern  . Not on file  Social History Narrative  . Not on file   Past Surgical History:  Procedure Laterality Date  . ABOVE KNEE LEG AMPUTATION Right    after below the knee amputation   . BELOW KNEE LEG AMPUTATION Right   . CATARACT EXTRACTION, BILATERAL Bilateral   . IMPLANTABLE CARDIOVERTER DEFIBRILLATOR (ICD) GENERATOR CHANGE Left 02/10/2017   Procedure: ICD GENERATOR CHANGE;  Surgeon: Isaias Cowman, MD;  Location: [facility] ORS;  Service: Cardiovascular;  Laterality: Left;  . INSERT / REPLACE / REMOVE PACEMAKER    . LOWER EXTREMITY ANGIOGRAPHY Left 06/07/2017   Procedure: LOWER EXTREMITY ANGIOGRAPHY;  Surgeon: Algernon Huxley, MD;  Location: [facility];  Service: Cardiovascular;  Laterality: Left;  . LOWER EXTREMITY INTERVENTION  06/07/2017   Procedure: LOWER EXTREMITY INTERVENTION;  Surgeon: Algernon Huxley, MD;  Location: [facility];  Service: Cardiovascular;;  . PERIPHERAL VASCULAR CATHETERIZATION Left 12/09/2015   Procedure: Lower Extremity Angiography;  Surgeon: Algernon Huxley, MD;  Location: [facility];  Service: Cardiovascular;  Laterality: Left;  . PERIPHERAL VASCULAR CATHETERIZATION  12/09/2015   Procedure: Lower Extremity Intervention;  Surgeon: Algernon Huxley, MD;  Location: [facility];  Service: Cardiovascular;;  . TONSILLECTOMY     Family History  Problem Relation Age of Onset  . Cancer Mother   . Cancer Father   . Heart disease Father    Allergies  Allergen Reactions  . Apixaban Rash  . Rivaroxaban Rash      Assessment & Plan:  Patient presents for his first post procedure follow-up.  The patient is status post an ultrasound  guidance for vascular access left femoral artery, Catheter placement into left anterior tibial artery from left femoral approach, Selective left lower extremity angiogram, Percutaneous transluminal angioplasty of left SFA with 5 mm diameter by 22 cm length Lutonix drug-coated angioplasty balloon, Percutaneous transluminal angioplasty of the left anterior tibial artery with 2.5 mm diameter by 10 cm length angioplasty balloon, Stent placement to the distal SFA with 6 mm diameter by 6 cm length stent for greater than 50% residual stenosis after angioplasty just below the previously placed stent.  The patient tolerated the procedure well however significant disease at the common femoral bifurcation was found that is not be amenable to percutaneous treatment.  The patient is still experiencing left foot pain especially left big toe pain.  The patient denies any worsening rest pain or ulceration to the left lower extremity.  The patient was recently hospitalized for the flu and mild pneumonia.  The patient denies any fever,  nausea vomiting.  1. PVD (peripheral vascular disease) ([place]) - Stable Patient status post a recent left lower extremity endovascular intervention which was not amenable to significant disease located at the common femoral bifurcation percutaneously. The patient will need to undergo a left femoral endarterectomy in an effort to revascularize and restore blood flow to the lower extremity. If arterial blood flow was not restored the patient is at high risk for tissue loss The patient has severe COPD and congestive heart failure and will most likely be high risk for surgery however we will start the clearance process Procedure, risks and benefits explained to the patient All questions answered The patient wishes to proceed  2. Chronic systolic CHF (congestive heart failure) ([facility]) - Stable Patient will need cardiac clearance before we are able to move ahead with surgery and will undoubtedly be  considered high risk  3. COPD exacerbation ([place]) - Stable Patient will need pulmonary clearance before we are able to move ahead with surgery and will undoubtedly be considered high risk  Current Outpatient Medications on File Prior to Visit  Medication Sig Dispense Refill  . acetaminophen (TYLENOL) 500 MG tablet Take 1,000 mg by mouth daily as needed for moderate pain or headache.    . ALPRAZolam (XANAX) 1 MG tablet Take 1 mg by mouth 2 (two) times daily as needed.     Marland Kitchen aspirin EC 81 MG tablet Take 1 tablet (81 mg total) by mouth daily. (Patient taking differently: Take 81 mg by mouth daily at 2 PM. (midday)) 150 tablet 2  . chlorpheniramine-HYDROcodone (TUSSIONEX) 10-8 MG/5ML SUER Take 5 mLs by mouth every 12 (twelve) hours. 115 mL 0  . Cyanocobalamin (B-12 COMPLIANCE INJECTION) 1000 MCG/ML KIT Inject 1,000 mcg as directed once a week.    . docusate sodium (COLACE) 100 MG capsule Take 100 mg by mouth at bedtime as needed (for constipation.).     Marland Kitchen fluticasone (FLONASE) 50 MCG/ACT nasal spray Place 2 sprays into both nostrils daily.    . fluticasone-salmeterol (ADVAIR HFA) 115-21 MCG/ACT inhaler USE 2 INHALATIONS ORALLY   EVERY 12 HOURS 12 g 5  . furosemide (LASIX) 20 MG tablet Take 10 mg by mouth daily as needed (for fluid retention.).     Marland Kitchen guaiFENesin (MUCINEX) 600 MG 12 hr tablet Take 600 mg by mouth 2 (two) times daily.    Marland Kitchen ipratropium (ATROVENT) 0.03 % nasal spray Place 2 sprays into both nostrils 2 (two) times daily.     . Ipratropium-Albuterol (COMBIVENT RESPIMAT) 20-100 MCG/ACT AERS respimat Inhale 1 puff into the lungs every 4 (four) hours.     Marland Kitchen levalbuterol (XOPENEX) 1.25 MG/3ML nebulizer solution Take 1.25 mg (3 mLs total) by nebulization every 4 (four) hours. (Patient taking differently: Take 3 mLs by nebulization every 4 (four) hours as needed for wheezing or shortness of breath ((typically twice daily)). ) 72 mL 12  . loratadine (CLARITIN) 10 MG tablet Take 10 mg by mouth at  bedtime.    . lovastatin (MEVACOR) 20 MG tablet Take 20 mg by mouth at bedtime.    . metoprolol succinate (TOPROL-XL) 25 MG 24 hr tablet Take 25 mg by mouth daily.    . nitroGLYCERIN (NITROSTAT) 0.4 MG SL tablet Place 0.4 mg under the tongue every 5 (five) minutes as needed for chest pain.    Marland Kitchen nystatin ointment (MYCOSTATIN) Apply 1 application topically at bedtime. Applied to corners of mouth  0  . oxyCODONE-acetaminophen (PERCOCET/ROXICET) 5-325 MG tablet Take 1 tablet by mouth every  6 (six) hours as needed. for pain  0  . pantoprazole (PROTONIX) 40 MG tablet Take 40 mg by mouth daily before breakfast.     . predniSONE (DELTASONE) 20 MG tablet Take 2 tablets of 37m daily for 5 days followed by 1 tablet of 271mdaily for 5 days and stop 15 tablet 0  . ramipril (ALTACE) 10 MG capsule Take 10 mg by mouth at bedtime.     . simethicone (MYLICON) 80 MG chewable tablet Chew by mouth.    . Tiotropium Bromide Monohydrate (SPIRIVA RESPIMAT) 1.25 MCG/ACT AERS Inhale 2 puffs into the lungs daily. 12 g 2  . traMADol (ULTRAM) 50 MG tablet Take 50 mg by mouth every 4 (four) hours as needed. for pain  0  . warfarin (COUMADIN) 5 MG tablet Take 5 mg by mouth at bedtime.      No current facility-administered medications on file prior to visit.    There are no Patient Instructions on file for this visit. No Follow-up on file.  Aydyn Testerman A Yeila Morro, PA-C

## 2017-07-22 ENCOUNTER — Ambulatory Visit
Admission: RE | Admit: 2017-07-22 | Discharge: 2017-07-22 | Disposition: A | Discharge status: Home / Self Care | Payer: 59 | Source: Ambulatory Visit | Attending: Internal Medicine | Admitting: Internal Medicine

## 2017-07-22 DIAGNOSIS — I700 Atherosclerosis of aorta: Secondary | ICD-10-CM | POA: Insufficient documentation

## 2017-07-22 DIAGNOSIS — I2510 Atherosclerotic heart disease of native coronary artery without angina pectoris: Secondary | ICD-10-CM | POA: Diagnosis not present

## 2017-07-22 DIAGNOSIS — R911 Solitary pulmonary nodule: Secondary | ICD-10-CM | POA: Diagnosis not present

## 2017-07-22 DIAGNOSIS — J432 Centrilobular emphysema: Secondary | ICD-10-CM | POA: Diagnosis not present

## 2017-07-22 DIAGNOSIS — J449 Chronic obstructive pulmonary disease, unspecified: Secondary | ICD-10-CM

## 2017-07-22 DIAGNOSIS — R918 Other nonspecific abnormal finding of lung field: Secondary | ICD-10-CM | POA: Diagnosis not present

## 2017-07-23 DIAGNOSIS — J441 Chronic obstructive pulmonary disease with (acute) exacerbation: Secondary | ICD-10-CM | POA: Diagnosis not present

## 2017-07-26 DIAGNOSIS — M5000 Cervical disc disorder with myelopathy, unspecified cervical region: Secondary | ICD-10-CM | POA: Diagnosis not present

## 2017-07-29 ENCOUNTER — Ambulatory Visit: Payer: Medicare Other

## 2017-07-29 DIAGNOSIS — M5481 Occipital neuralgia: Secondary | ICD-10-CM | POA: Diagnosis not present

## 2017-07-29 DIAGNOSIS — R51 Headache: Secondary | ICD-10-CM | POA: Diagnosis not present

## 2017-08-02 ENCOUNTER — Ambulatory Visit: Payer: Medicare HMO | Admitting: Internal Medicine

## 2017-08-02 ENCOUNTER — Encounter
Admission: RE | Admit: 2017-08-02 | Discharge: 2017-08-02 | Disposition: A | Discharge status: Home / Self Care | Payer: Medicare HMO | Source: Ambulatory Visit | Attending: Vascular Surgery | Admitting: Vascular Surgery

## 2017-08-02 ENCOUNTER — Other Ambulatory Visit (INDEPENDENT_AMBULATORY_CARE_PROVIDER_SITE_OTHER): Payer: Self-pay | Admitting: Vascular Surgery

## 2017-08-02 ENCOUNTER — Encounter: Payer: Self-pay | Admitting: Internal Medicine

## 2017-08-02 ENCOUNTER — Other Ambulatory Visit: Payer: Self-pay

## 2017-08-02 VITALS — BP 126/82 | HR 80 | Ht 73.0 in

## 2017-08-02 DIAGNOSIS — J9611 Chronic respiratory failure with hypoxia: Secondary | ICD-10-CM

## 2017-08-02 DIAGNOSIS — J449 Chronic obstructive pulmonary disease, unspecified: Secondary | ICD-10-CM

## 2017-08-02 DIAGNOSIS — R0602 Shortness of breath: Secondary | ICD-10-CM

## 2017-08-02 DIAGNOSIS — R918 Other nonspecific abnormal finding of lung field: Secondary | ICD-10-CM

## 2017-08-02 DIAGNOSIS — I4891 Unspecified atrial fibrillation: Secondary | ICD-10-CM | POA: Diagnosis not present

## 2017-08-02 DIAGNOSIS — I2510 Atherosclerotic heart disease of native coronary artery without angina pectoris: Secondary | ICD-10-CM | POA: Diagnosis not present

## 2017-08-02 DIAGNOSIS — Z95810 Presence of automatic (implantable) cardiac defibrillator: Secondary | ICD-10-CM | POA: Diagnosis not present

## 2017-08-02 DIAGNOSIS — Z01810 Encounter for preprocedural cardiovascular examination: Secondary | ICD-10-CM | POA: Insufficient documentation

## 2017-08-02 DIAGNOSIS — Z01812 Encounter for preprocedural laboratory examination: Secondary | ICD-10-CM | POA: Insufficient documentation

## 2017-08-02 DIAGNOSIS — I10 Essential (primary) hypertension: Secondary | ICD-10-CM | POA: Diagnosis not present

## 2017-08-02 DIAGNOSIS — I482 Chronic atrial fibrillation: Secondary | ICD-10-CM | POA: Diagnosis not present

## 2017-08-02 DIAGNOSIS — I5022 Chronic systolic (congestive) heart failure: Secondary | ICD-10-CM | POA: Diagnosis not present

## 2017-08-02 HISTORY — DX: Spinal stenosis, cervical region: M48.02

## 2017-08-02 HISTORY — DX: Unspecified osteoarthritis, unspecified site: M19.90

## 2017-08-02 LAB — CBC WITH DIFFERENTIAL/PLATELET
BASOS ABS: 0 10*3/uL (ref 0–0.1)
Basophils Relative: 1 %
EOS PCT: 0 %
Eosinophils Absolute: 0 10*3/uL (ref 0–0.7)
HCT: 39.5 % — ABNORMAL LOW (ref 40.0–52.0)
Hemoglobin: 13.2 g/dL (ref 13.0–18.0)
LYMPHS PCT: 12 %
Lymphs Abs: 1.1 10*3/uL (ref 1.0–3.6)
MCH: 28.9 pg (ref 26.0–34.0)
MCHC: 33.5 g/dL (ref 32.0–36.0)
MCV: 86.2 fL (ref 80.0–100.0)
Monocytes Absolute: 0.7 10*3/uL (ref 0.2–1.0)
Monocytes Relative: 8 %
Neutro Abs: 7.4 10*3/uL — ABNORMAL HIGH (ref 1.4–6.5)
Neutrophils Relative %: 79 %
PLATELETS: 261 10*3/uL (ref 150–440)
RBC: 4.58 MIL/uL (ref 4.40–5.90)
RDW: 14.8 % — ABNORMAL HIGH (ref 11.5–14.5)
WBC: 9.3 10*3/uL (ref 3.8–10.6)

## 2017-08-02 LAB — TYPE AND SCREEN
ABO/RH(D): A POS
ANTIBODY SCREEN: NEGATIVE

## 2017-08-02 LAB — PROTIME-INR
INR: 2.02
PROTHROMBIN TIME: 22.7 s — AB (ref 11.4–15.2)

## 2017-08-02 LAB — BASIC METABOLIC PANEL
ANION GAP: 8 (ref 5–15)
BUN: 9 mg/dL (ref 6–20)
CO2: 29 mmol/L (ref 22–32)
Calcium: 9.3 mg/dL (ref 8.9–10.3)
Chloride: 95 mmol/L — ABNORMAL LOW (ref 101–111)
Creatinine, Ser: 0.73 mg/dL (ref 0.61–1.24)
Glucose, Bld: 92 mg/dL (ref 65–99)
POTASSIUM: 3.7 mmol/L (ref 3.5–5.1)
SODIUM: 132 mmol/L — AB (ref 135–145)

## 2017-08-02 LAB — APTT: aPTT: 35 seconds (ref 24–36)

## 2017-08-02 LAB — SURGICAL PCR SCREEN
MRSA, PCR: NEGATIVE
Staphylococcus aureus: NEGATIVE

## 2017-08-02 NOTE — Progress Notes (Signed)
Name: MAKYA YURKO MRN:   [id number] DOB:   [birth year]             CONSULTATION DATE:  08/02/2017   REFERRING MD :  Dr. Bridgett Larsson   CHIEF COMPLAINT: Shortness of Breath/COPD  BRIEF PATIENT DESCRIPTION:  62 yo old male admitted with 05/22 with acute on chronic hypercapnic hypoxic respiratory failure secondary to AECOPD   STUDIES:  CXR 10/21/16 I have Independently reviewed images of  cxr   on 08/02/2017 Interpretation: No evidence of opacifications no effusions   PATIENT PROFILE This is a 62 yo male with a PMH of Cardiac Pacemaker, PVD, HTN, Myocardial Infarction, CAD, COPD, Chronic Systolic CHF, Atrial Fibrillation, and AICD.    He [facility] ER [facility]/22 with c/o worsening shortness of breath with chest COPD exacerbation Hosp course-Steroids, ABX and BD therapy patient discharged home with oxygen  Patient saw Dr. 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  Mother died of lung cancer Father died of kidney cancer  PREVIOUS OV Patient remained short of breath with dyspnea on exertion No signs of infection at this time  No signs of CHF at this time  Patient currently on Advair 115 twice daily Patient also is on Spiriva 1.25 Patient is currently on oxygen therapy 24/7 Patient states that oxygen therapy helps his resp status tremendously   CURRENT OV Recent admission for FLU A COPD exacerbation CT chest reviewed with patient Residual Pneumonia/opacities Lung nodules 4 mm Will need CT chest in 6 months  No signs of infection at  this time No signs of COPD exacerbation No CHF exacerbation at this time          REVIEW OF SYSTEMS:  Positives in BOLD  Constitutional: Negative for fever, chills, weight loss, malaise/fatigue and diaphoresis.  HENT: Negative for hearing loss, ear pain, nosebleeds, congestion, sore throat, neck pain, tinnitus and ear discharge.   Eyes: Negative for blurred vision, double vision, photophobia, pain, discharge and redness.  Respiratory: + cough, hemoptysis, -sputum production, + shortness of breath, + chest congestion,  - wheezing and stridor.   Cardiovascular: Negative for chest pain, palpitations, orthopnea, claudication, leg swelling and PND.  Gastrointestinal: Negative for heartburn, nausea, vomiting, abdominal pain, diarrhea, constipation, blood in stool and melena.  Genitourinary: Negative for dysuria, urgency, frequency, hematuria and flank pain.  OTHER ROS NEGATIVE  BP 126/82 (BP Location: Left Arm, Cuff Size: Large)   Pulse 80   Ht 6\' 1"  (1.854 m)   SpO2 100%   BMI 18.73 kg/m   PHYSICAL EXAMINATION: General: , NAD Neuro: alert and oriented, follows commands HEENT: supple, no JVD Cardiovascular: irregular irregular, no M/R/G Lungs: diminished throughout, even, non labored CTA B/L Abdomen: +BS x4, soft, non tender, non distended Musculoskeletal: right aka, normal tone Skin: intact no rashes or lesions   ASSESSMENT / PLAN: 62 year old pleasant white male with multiple medical problems including CHF, very severe COPD Acute on chronic hypoxic hypercapnic respiratory failure secondary to COPD with Chronic Atrial Fibrillation And h/o Pacemaker, HTN, CAD, AICD, and MI with chronic  hypoxic respiratory failure  #1 shortness of breath and dyspnea on exertion Related to multifactorial issues with underlying chronic CHF underlying severe COPD in the setting of deconditioned state  #2 COPD severe Gold stage D Continue inhalers as prescribed patient is currently on Advair and  Spiriva Continue Spiriva as prescribed 1.25 dose Continue Advair as prescribed 115 dose No indication for antibiotics at this time No indication for systemic oral prednisone at this time   #3 allergic rhinitis Continue antihistamine with Claritin as prescribed   #4 chronic hypoxic restaurant failure Patient uses and benefits from o2 therapy    #5 Extensive smoking history with severe COPD CT scan reports show bilateral subcentimeter nodules the largest solid nodule was noted to be 4 mm in average dimension Recommended follow-up CT scan in 6 months  #6 atrial fibrillation Exline continue Coumadin therapy as prescribed  #7 systolic CHF Follow up with cardiology as scheduled Continue Lasix as prescribed  #8 deconditioned state Increase exercise capacity is tolerated this will be difficult to do in the setting of right AKA  #9 Left Leg ischemia Plan for stent placements next week  Patient/Family are satisfied with Plan of action and management. All questions answered Follow-up in 6 months with interval changes and CT chest   Sathvik Tiedt Patricia Pesa, M.D.  [facility]  Medical Director [place] Director [facility] Cardio-Pulmonary Department

## 2017-08-02 NOTE — Pre-Procedure Instructions (Signed)
Implanted cardiac device programming sheet faxed to Dr Alveria Apley office

## 2017-08-02 NOTE — Patient Instructions (Signed)
Your procedure is scheduled on: August 11, 2017 [facility] Report to Day Surgery on the 2nd floor of the [facility]. To find out your arrival time, please call [phone] between 1PM - 3PM on: August 10, 2017 TUESDAY  REMEMBER: Instructions that are not followed completely may result in serious medical risk, up to and including death; or upon the discretion of your surgeon and anesthesiologist your surgery may need to be rescheduled.  Do not eat food after midnight the night before your procedure.  No gum chewing, lozengers or hard candies.  You may however, drink CLEAR liquids up to 2 hours before you are scheduled to arrive for your surgery. Do not drink anything within 2 hours of the start of your surgery.  Clear liquids include: - water  - apple juice without pulp - clear gatorade - black coffee or tea (Do NOT add anything to the coffee or tea) Do NOT drink anything that is not on this list.  Type 1 and Type 2 diabetics should only drink water.  No Alcohol for 24 hours before or after surgery.  No Smoking including e-cigarettes for 24 hours prior to surgery.  No chewable tobacco products for at least 6 hours prior to surgery.  No nicotine patches on the day of surgery.  On the morning of surgery brush your teeth with toothpaste and water, you may rinse your mouth with mouthwash if you wish. Do not swallow any toothpaste or mouthwash.  Notify your doctor if there is any change in your medical condition (cold, fever, infection).  Do not wear jewelry, make-up, hairpins, clips or nail polish.  Do not wear lotions, powders, or perfumes. You may NOT wear deodorant.  Do not shave 48 hours prior to surgery. Men may shave face and neck.  Contacts and dentures may not be worn into surgery.  Do not bring valuables to the hospital, including drivers license, insurance or credit cards.  [facility] is not responsible for any belongings or valuables.   TAKE THESE MEDICATIONS THE  MORNING OF SURGERY: XANAX METOPROLOL PANTOPRAZOLE TAKE DOSE NIGHT BEFORE SURGERY AND DAY OF SURGERY    Use CHG Soap or wipes as directed on instruction sheet.  Use inhalers, NOSE SPRAY OR BREATHING TREATMENS on the day of surgery and bring to the hospital.  Follow recommendations from Cardiologist, Pulmonologist or PCP regarding stopping Aspirin, Coumadin, Plavix, Eliquis, Pradaxa, or Pletal.  Stop Anti-inflammatories (NSAIDS) such as Advil, Aleve, Ibuprofen, Motrin, Naproxen, Naprosyn and Aspirin based products such as Excedrin, Goodys Powder, BC Powder. (May take Tylenol or Acetaminophen if needed.)  Stop ANY OVER THE COUNTER supplements until after surgery. (May continue Vitamin D, Vitamin B, and multivitamin.)  Wear comfortable clothing (specific to your surgery type) to the hospital.  Plan for stool softeners for home use.  If you are being admitted to the hospital overnight, leave your suitcase in the car. After surgery it may be brought to your room.  If you are being discharged the day of surgery, you will not be allowed to drive home. You will need a responsible adult to drive you home and stay with you that night.   If you are taking public transportation, you will need to have a responsible adult with you. Please confirm with your physician that it is acceptable to use public transportation.   Please call [phone] if you have any questions about these instructions.

## 2017-08-02 NOTE — Patient Instructions (Addendum)
Continue inhalers Continue oxygen  Follow up with Cardiology Follow up with Dr Lucky Cowboy  CT chest in 6 months

## 2017-08-02 NOTE — Pre-Procedure Instructions (Signed)
Abnormal labs results- PT-INR, cbc and met b faxed to Dr Bunnie Domino office

## 2017-08-03 ENCOUNTER — Inpatient Hospital Stay: Admission: RE | Admit: 2017-08-03 | Payer: Medicare Other | Source: Ambulatory Visit

## 2017-08-03 DIAGNOSIS — I480 Paroxysmal atrial fibrillation: Secondary | ICD-10-CM | POA: Diagnosis not present

## 2017-08-10 MED ORDER — CEFAZOLIN SODIUM-DEXTROSE 2-4 GM/100ML-% IV SOLN
2.0000 g | INTRAVENOUS | Status: AC
  Administered 2017-08-11: 2 g via INTRAVENOUS

## 2017-08-11 ENCOUNTER — Other Ambulatory Visit: Payer: Self-pay

## 2017-08-11 ENCOUNTER — Inpatient Hospital Stay: Payer: Medicare HMO | Admitting: Registered Nurse

## 2017-08-11 ENCOUNTER — Encounter
Admission: RE | Disposition: A | Discharge status: Home Health | Payer: Self-pay | Source: Home / Self Care | Attending: Vascular Surgery

## 2017-08-11 ENCOUNTER — Inpatient Hospital Stay
Admission: RE | Admit: 2017-08-11 | Discharge: 2017-08-13 | DRG: 253 | Disposition: A | Discharge status: Home Health | Payer: Medicare HMO | Attending: Vascular Surgery | Admitting: Vascular Surgery

## 2017-08-11 DIAGNOSIS — J449 Chronic obstructive pulmonary disease, unspecified: Secondary | ICD-10-CM | POA: Diagnosis present

## 2017-08-11 DIAGNOSIS — L600 Ingrowing nail: Secondary | ICD-10-CM | POA: Diagnosis not present

## 2017-08-11 DIAGNOSIS — Z87891 Personal history of nicotine dependence: Secondary | ICD-10-CM | POA: Diagnosis not present

## 2017-08-11 DIAGNOSIS — J9611 Chronic respiratory failure with hypoxia: Secondary | ICD-10-CM | POA: Diagnosis present

## 2017-08-11 DIAGNOSIS — I252 Old myocardial infarction: Secondary | ICD-10-CM | POA: Diagnosis not present

## 2017-08-11 DIAGNOSIS — I70222 Atherosclerosis of native arteries of extremities with rest pain, left leg: Secondary | ICD-10-CM | POA: Diagnosis not present

## 2017-08-11 DIAGNOSIS — Z89611 Acquired absence of right leg above knee: Secondary | ICD-10-CM

## 2017-08-11 DIAGNOSIS — I998 Other disorder of circulatory system: Secondary | ICD-10-CM | POA: Diagnosis present

## 2017-08-11 DIAGNOSIS — Z95810 Presence of automatic (implantable) cardiac defibrillator: Secondary | ICD-10-CM

## 2017-08-11 DIAGNOSIS — J309 Allergic rhinitis, unspecified: Secondary | ICD-10-CM | POA: Diagnosis present

## 2017-08-11 DIAGNOSIS — I2510 Atherosclerotic heart disease of native coronary artery without angina pectoris: Secondary | ICD-10-CM | POA: Diagnosis not present

## 2017-08-11 DIAGNOSIS — E782 Mixed hyperlipidemia: Secondary | ICD-10-CM | POA: Diagnosis not present

## 2017-08-11 DIAGNOSIS — I4891 Unspecified atrial fibrillation: Secondary | ICD-10-CM | POA: Diagnosis present

## 2017-08-11 DIAGNOSIS — Z7951 Long term (current) use of inhaled steroids: Secondary | ICD-10-CM

## 2017-08-11 DIAGNOSIS — M1990 Unspecified osteoarthritis, unspecified site: Secondary | ICD-10-CM | POA: Diagnosis present

## 2017-08-11 DIAGNOSIS — I739 Peripheral vascular disease, unspecified: Secondary | ICD-10-CM | POA: Diagnosis not present

## 2017-08-11 DIAGNOSIS — J441 Chronic obstructive pulmonary disease with (acute) exacerbation: Secondary | ICD-10-CM | POA: Diagnosis not present

## 2017-08-11 DIAGNOSIS — I110 Hypertensive heart disease with heart failure: Secondary | ICD-10-CM | POA: Diagnosis present

## 2017-08-11 DIAGNOSIS — E1151 Type 2 diabetes mellitus with diabetic peripheral angiopathy without gangrene: Secondary | ICD-10-CM | POA: Diagnosis not present

## 2017-08-11 DIAGNOSIS — I5022 Chronic systolic (congestive) heart failure: Secondary | ICD-10-CM | POA: Diagnosis present

## 2017-08-11 DIAGNOSIS — I7389 Other specified peripheral vascular diseases: Secondary | ICD-10-CM | POA: Diagnosis not present

## 2017-08-11 DIAGNOSIS — K219 Gastro-esophageal reflux disease without esophagitis: Secondary | ICD-10-CM | POA: Diagnosis present

## 2017-08-11 DIAGNOSIS — Z9981 Dependence on supplemental oxygen: Secondary | ICD-10-CM

## 2017-08-11 DIAGNOSIS — M79605 Pain in left leg: Secondary | ICD-10-CM | POA: Diagnosis present

## 2017-08-11 DIAGNOSIS — I70229 Atherosclerosis of native arteries of extremities with rest pain, unspecified extremity: Secondary | ICD-10-CM | POA: Diagnosis present

## 2017-08-11 HISTORY — PX: ENDARTERECTOMY FEMORAL: SHX5804

## 2017-08-11 LAB — PROTIME-INR
INR: 1.11
PROTHROMBIN TIME: 14.2 s (ref 11.4–15.2)

## 2017-08-11 LAB — ABO/RH: ABO/RH(D): A POS

## 2017-08-11 LAB — GLUCOSE, CAPILLARY
GLUCOSE-CAPILLARY: 72 mg/dL (ref 65–99)
Glucose-Capillary: 66 mg/dL (ref 65–99)

## 2017-08-11 SURGERY — ENDARTERECTOMY, FEMORAL
Anesthesia: General | Laterality: Left | Wound class: Clean

## 2017-08-11 MED ORDER — NITROGLYCERIN IN D5W 200-5 MCG/ML-% IV SOLN: 5.0000 ug/min | INTRAVENOUS | Status: DC

## 2017-08-11 MED ORDER — GUAIFENESIN-DM 100-10 MG/5ML PO SYRP
15.0000 mL | ORAL_SOLUTION | ORAL | Status: DC | PRN
  Filled 2017-08-11: qty 15

## 2017-08-11 MED ORDER — ONDANSETRON HCL 4 MG/2ML IJ SOLN
INTRAMUSCULAR | Status: DC | PRN
  Administered 2017-08-11: 4 mg via INTRAVENOUS

## 2017-08-11 MED ORDER — RAMIPRIL 10 MG PO CAPS
10.0000 mg | ORAL_CAPSULE | Freq: Every day | ORAL | Status: DC
  Administered 2017-08-11 – 2017-08-12 (×2): 10 mg via ORAL
  Filled 2017-08-11 (×3): qty 1

## 2017-08-11 MED ORDER — CHLORHEXIDINE GLUCONATE CLOTH 2 % EX PADS: 6.0000 | MEDICATED_PAD | Freq: Once | CUTANEOUS | Status: DC

## 2017-08-11 MED ORDER — LABETALOL HCL 5 MG/ML IV SOLN: 10.0000 mg | INTRAVENOUS | Status: DC | PRN

## 2017-08-11 MED ORDER — FENTANYL CITRATE (PF) 100 MCG/2ML IJ SOLN
25.0000 ug | INTRAMUSCULAR | Status: AC | PRN
  Administered 2017-08-11 (×6): 25 ug via INTRAVENOUS

## 2017-08-11 MED ORDER — LIDOCAINE HCL (PF) 2 % IJ SOLN
INTRAMUSCULAR | Status: AC
  Filled 2017-08-11: qty 10

## 2017-08-11 MED ORDER — IPRATROPIUM-ALBUTEROL 20-100 MCG/ACT IN AERS: 1.0000 | INHALATION_SPRAY | RESPIRATORY_TRACT | Status: DC

## 2017-08-11 MED ORDER — HEPARIN SODIUM (PORCINE) 5000 UNIT/ML IJ SOLN
INTRAMUSCULAR | Status: AC
  Filled 2017-08-11: qty 1

## 2017-08-11 MED ORDER — FENTANYL CITRATE (PF) 250 MCG/5ML IJ SOLN
INTRAMUSCULAR | Status: AC
  Filled 2017-08-11: qty 5

## 2017-08-11 MED ORDER — CYANOCOBALAMIN 1000 MCG/ML IJ SOLN
1000.0000 ug | INTRAMUSCULAR | Status: DC
  Administered 2017-08-11: 1000 ug via INTRAMUSCULAR
  Filled 2017-08-11: qty 1

## 2017-08-11 MED ORDER — PHENYLEPHRINE HCL 10 MG/ML IJ SOLN
INTRAMUSCULAR | Status: DC | PRN
  Administered 2017-08-11: 200 ug via INTRAVENOUS
  Administered 2017-08-11 (×3): 100 ug via INTRAVENOUS
  Administered 2017-08-11 (×2): 200 ug via INTRAVENOUS
  Administered 2017-08-11: 100 ug via INTRAVENOUS

## 2017-08-11 MED ORDER — CEFAZOLIN SODIUM-DEXTROSE 2-4 GM/100ML-% IV SOLN
INTRAVENOUS | Status: AC
  Filled 2017-08-11: qty 100

## 2017-08-11 MED ORDER — SUCCINYLCHOLINE CHLORIDE 20 MG/ML IJ SOLN
INTRAMUSCULAR | Status: AC
  Filled 2017-08-11: qty 1

## 2017-08-11 MED ORDER — LIDOCAINE HCL (CARDIAC) 20 MG/ML IV SOLN
INTRAVENOUS | Status: DC | PRN
  Administered 2017-08-11: 100 mg via INTRAVENOUS

## 2017-08-11 MED ORDER — PRAVASTATIN SODIUM 20 MG PO TABS
20.0000 mg | ORAL_TABLET | Freq: Every day | ORAL | Status: DC
  Administered 2017-08-11 – 2017-08-12 (×2): 20 mg via ORAL
  Filled 2017-08-11 (×2): qty 1

## 2017-08-11 MED ORDER — OXYCODONE-ACETAMINOPHEN 5-325 MG PO TABS
1.0000 | ORAL_TABLET | Freq: Four times a day (QID) | ORAL | Status: DC | PRN
  Administered 2017-08-11 – 2017-08-13 (×4): 1 via ORAL
  Filled 2017-08-11 (×4): qty 1

## 2017-08-11 MED ORDER — SUGAMMADEX SODIUM 200 MG/2ML IV SOLN
INTRAVENOUS | Status: AC
Start: 2017-08-11 — End: 2017-08-11
  Filled 2017-08-11: qty 2

## 2017-08-11 MED ORDER — SENNOSIDES-DOCUSATE SODIUM 8.6-50 MG PO TABS: 1.0000 | ORAL_TABLET | Freq: Every evening | ORAL | Status: DC | PRN

## 2017-08-11 MED ORDER — PANTOPRAZOLE SODIUM 40 MG PO TBEC
40.0000 mg | DELAYED_RELEASE_TABLET | Freq: Every day | ORAL | Status: DC
  Administered 2017-08-12 – 2017-08-13 (×2): 40 mg via ORAL
  Filled 2017-08-11 (×2): qty 1

## 2017-08-11 MED ORDER — IPRATROPIUM-ALBUTEROL 0.5-2.5 (3) MG/3ML IN SOLN: 3.0000 mL | RESPIRATORY_TRACT | Status: DC | PRN

## 2017-08-11 MED ORDER — FUROSEMIDE 20 MG PO TABS: 10.0000 mg | ORAL_TABLET | Freq: Every day | ORAL | Status: DC | PRN

## 2017-08-11 MED ORDER — LACTATED RINGERS IV SOLN
INTRAVENOUS | Status: DC | PRN
  Administered 2017-08-11: 08:00:00 via INTRAVENOUS

## 2017-08-11 MED ORDER — ALBUTEROL SULFATE (5 MG/ML) 0.5% IN NEBU: 2.5000 mg | INHALATION_SOLUTION | RESPIRATORY_TRACT | Status: DC | PRN

## 2017-08-11 MED ORDER — GUAIFENESIN ER 600 MG PO TB12
600.0000 mg | ORAL_TABLET | Freq: Two times a day (BID) | ORAL | Status: DC
  Administered 2017-08-11 – 2017-08-13 (×5): 600 mg via ORAL
  Filled 2017-08-11 (×5): qty 1

## 2017-08-11 MED ORDER — ALUM & MAG HYDROXIDE-SIMETH 200-200-20 MG/5ML PO SUSP
15.0000 mL | ORAL | Status: DC | PRN
  Filled 2017-08-11: qty 30

## 2017-08-11 MED ORDER — LORATADINE 10 MG PO TABS
10.0000 mg | ORAL_TABLET | Freq: Every day | ORAL | Status: DC
  Administered 2017-08-11 – 2017-08-12 (×2): 10 mg via ORAL
  Filled 2017-08-11 (×2): qty 1

## 2017-08-11 MED ORDER — ACETAMINOPHEN 500 MG PO TABS: 1000.0000 mg | ORAL_TABLET | Freq: Every day | ORAL | Status: DC | PRN

## 2017-08-11 MED ORDER — PROPOFOL 10 MG/ML IV BOLUS
INTRAVENOUS | Status: DC | PRN
  Administered 2017-08-11: 140 mg via INTRAVENOUS

## 2017-08-11 MED ORDER — WARFARIN SODIUM 5 MG PO TABS
5.0000 mg | ORAL_TABLET | Freq: Every day | ORAL | Status: DC
  Administered 2017-08-11 – 2017-08-12 (×2): 5 mg via ORAL
  Filled 2017-08-11 (×3): qty 1

## 2017-08-11 MED ORDER — ONDANSETRON HCL 4 MG/2ML IJ SOLN
4.0000 mg | Freq: Four times a day (QID) | INTRAMUSCULAR | Status: DC | PRN
  Administered 2017-08-11: 4 mg via INTRAVENOUS
  Filled 2017-08-11: qty 2

## 2017-08-11 MED ORDER — ONDANSETRON HCL 4 MG/2ML IJ SOLN: 4.0000 mg | Freq: Once | INTRAMUSCULAR | Status: DC | PRN

## 2017-08-11 MED ORDER — FENTANYL CITRATE (PF) 100 MCG/2ML IJ SOLN
INTRAMUSCULAR | Status: AC
  Administered 2017-08-11: 25 ug via INTRAVENOUS
  Filled 2017-08-11: qty 2

## 2017-08-11 MED ORDER — ALPRAZOLAM 1 MG PO TABS
1.0000 mg | ORAL_TABLET | Freq: Two times a day (BID) | ORAL | Status: DC
  Administered 2017-08-11 – 2017-08-13 (×4): 1 mg via ORAL
  Filled 2017-08-11 (×4): qty 1

## 2017-08-11 MED ORDER — SODIUM CHLORIDE 0.9 % IV SOLN
INTRAVENOUS | Status: DC | PRN
  Administered 2017-08-11: 50 ug/min via INTRAVENOUS
  Administered 2017-08-11: 30 ug/min via INTRAVENOUS

## 2017-08-11 MED ORDER — POTASSIUM CHLORIDE CRYS ER 20 MEQ PO TBCR: 20.0000 meq | EXTENDED_RELEASE_TABLET | Freq: Every day | ORAL | Status: DC | PRN

## 2017-08-11 MED ORDER — DOPAMINE-DEXTROSE 3.2-5 MG/ML-% IV SOLN: 3.0000 ug/kg/min | INTRAVENOUS | Status: DC

## 2017-08-11 MED ORDER — PROPOFOL 10 MG/ML IV BOLUS
INTRAVENOUS | Status: AC
Start: 2017-08-11 — End: 2017-08-11
  Filled 2017-08-11: qty 20

## 2017-08-11 MED ORDER — LACTATED RINGERS IV SOLN
INTRAVENOUS | Status: DC
  Administered 2017-08-11: 08:00:00 via INTRAVENOUS
  Administered 2017-08-11: 1000 mL via INTRAVENOUS

## 2017-08-11 MED ORDER — ACETAMINOPHEN 10 MG/ML IV SOLN: INTRAVENOUS | Status: DC | PRN

## 2017-08-11 MED ORDER — IPRATROPIUM BROMIDE 0.03 % NA SOLN
2.0000 | Freq: Two times a day (BID) | NASAL | Status: DC
Start: 2017-08-11 — End: 2017-08-13
  Administered 2017-08-11 – 2017-08-13 (×5): 2 via NASAL
  Filled 2017-08-11: qty 30

## 2017-08-11 MED ORDER — ACETAMINOPHEN 325 MG PO TABS: 325.0000 mg | ORAL_TABLET | ORAL | Status: DC | PRN

## 2017-08-11 MED ORDER — WARFARIN - PHYSICIAN DOSING INPATIENT
Freq: Every day | Status: DC
  Administered 2017-08-11 – 2017-08-12 (×2)

## 2017-08-11 MED ORDER — NITROGLYCERIN 0.4 MG SL SUBL: 0.4000 mg | SUBLINGUAL_TABLET | SUBLINGUAL | Status: DC | PRN

## 2017-08-11 MED ORDER — PHENOL 1.4 % MT LIQD: 1.0000 | OROMUCOSAL | Status: DC | PRN

## 2017-08-11 MED ORDER — ROCURONIUM BROMIDE 100 MG/10ML IV SOLN
INTRAVENOUS | Status: DC | PRN
  Administered 2017-08-11: 50 mg via INTRAVENOUS

## 2017-08-11 MED ORDER — TIOTROPIUM BROMIDE MONOHYDRATE 18 MCG IN CAPS
18.0000 ug | ORAL_CAPSULE | Freq: Every day | RESPIRATORY_TRACT | Status: DC
  Administered 2017-08-12 – 2017-08-13 (×2): 18 ug via RESPIRATORY_TRACT
  Filled 2017-08-11: qty 5

## 2017-08-11 MED ORDER — FLUTICASONE PROPIONATE 50 MCG/ACT NA SUSP
2.0000 | Freq: Every day | NASAL | Status: DC
  Administered 2017-08-12 – 2017-08-13 (×2): 2 via NASAL
  Filled 2017-08-11: qty 16

## 2017-08-11 MED ORDER — MOMETASONE FURO-FORMOTEROL FUM 200-5 MCG/ACT IN AERO
2.0000 | INHALATION_SPRAY | Freq: Two times a day (BID) | RESPIRATORY_TRACT | Status: DC
  Administered 2017-08-11 – 2017-08-13 (×5): 2 via RESPIRATORY_TRACT
  Filled 2017-08-11: qty 8.8

## 2017-08-11 MED ORDER — ACETAMINOPHEN 650 MG RE SUPP: 325.0000 mg | RECTAL | Status: DC | PRN

## 2017-08-11 MED ORDER — ROCURONIUM BROMIDE 50 MG/5ML IV SOLN
INTRAVENOUS | Status: AC
  Filled 2017-08-11: qty 1

## 2017-08-11 MED ORDER — TIOTROPIUM BROMIDE MONOHYDRATE 1.25 MCG/ACT IN AERS: 2.0000 | INHALATION_SPRAY | Freq: Every day | RESPIRATORY_TRACT | Status: DC

## 2017-08-11 MED ORDER — HYDRALAZINE HCL 20 MG/ML IJ SOLN: 5.0000 mg | INTRAMUSCULAR | Status: DC | PRN

## 2017-08-11 MED ORDER — ONDANSETRON HCL 4 MG/2ML IJ SOLN
INTRAMUSCULAR | Status: AC
  Filled 2017-08-11: qty 2

## 2017-08-11 MED ORDER — DOCUSATE SODIUM 100 MG PO CAPS: 100.0000 mg | ORAL_CAPSULE | Freq: Every evening | ORAL | Status: DC | PRN

## 2017-08-11 MED ORDER — SODIUM CHLORIDE 0.9 % IV SOLN: 500.0000 mL | Freq: Once | INTRAVENOUS | Status: DC | PRN

## 2017-08-11 MED ORDER — FENTANYL CITRATE (PF) 100 MCG/2ML IJ SOLN
INTRAMUSCULAR | Status: DC | PRN
  Administered 2017-08-11: 25 ug via INTRAVENOUS
  Administered 2017-08-11: 50 ug via INTRAVENOUS

## 2017-08-11 MED ORDER — EPHEDRINE SULFATE 50 MG/ML IJ SOLN
INTRAMUSCULAR | Status: AC
  Filled 2017-08-11: qty 1

## 2017-08-11 MED ORDER — MAGNESIUM SULFATE 2 GM/50ML IV SOLN: 2.0000 g | Freq: Every day | INTRAVENOUS | Status: DC | PRN

## 2017-08-11 MED ORDER — SORBITOL 70 % SOLN
30.0000 mL | Freq: Every day | Status: DC | PRN
  Filled 2017-08-11: qty 30

## 2017-08-11 MED ORDER — ASPIRIN EC 81 MG PO TBEC
81.0000 mg | DELAYED_RELEASE_TABLET | Freq: Every day | ORAL | Status: DC
  Administered 2017-08-11 – 2017-08-12 (×2): 81 mg via ORAL
  Filled 2017-08-11 (×2): qty 1

## 2017-08-11 MED ORDER — MIDAZOLAM HCL 2 MG/2ML IJ SOLN
INTRAMUSCULAR | Status: DC | PRN
  Administered 2017-08-11: 2 mg via INTRAVENOUS

## 2017-08-11 MED ORDER — SUGAMMADEX SODIUM 200 MG/2ML IV SOLN
INTRAVENOUS | Status: DC | PRN
  Administered 2017-08-11: 200 mg via INTRAVENOUS

## 2017-08-11 MED ORDER — PHENYLEPHRINE HCL 10 MG/ML IJ SOLN
INTRAMUSCULAR | Status: AC
  Filled 2017-08-11: qty 1

## 2017-08-11 MED ORDER — VITAMIN B-12 1000 MCG PO TABS
1000.0000 ug | ORAL_TABLET | Freq: Every day | ORAL | Status: DC
  Administered 2017-08-11 – 2017-08-13 (×3): 1000 ug via ORAL
  Filled 2017-08-11 (×3): qty 1

## 2017-08-11 MED ORDER — SODIUM CHLORIDE 0.9 % IV SOLN
INTRAVENOUS | Status: DC
  Administered 2017-08-11 – 2017-08-12 (×3): via INTRAVENOUS

## 2017-08-11 MED ORDER — METOPROLOL SUCCINATE ER 25 MG PO TB24
25.0000 mg | ORAL_TABLET | Freq: Every day | ORAL | Status: DC
  Administered 2017-08-12 – 2017-08-13 (×2): 25 mg via ORAL
  Filled 2017-08-11 (×2): qty 1

## 2017-08-11 MED ORDER — METOPROLOL TARTRATE 5 MG/5ML IV SOLN: 2.0000 mg | INTRAVENOUS | Status: DC | PRN

## 2017-08-11 MED ORDER — EVICEL 2 ML EX KIT
PACK | CUTANEOUS | Status: AC
  Filled 2017-08-11: qty 1

## 2017-08-11 MED ORDER — CEFAZOLIN SODIUM-DEXTROSE 2-4 GM/100ML-% IV SOLN
2.0000 g | Freq: Three times a day (TID) | INTRAVENOUS | Status: AC
  Administered 2017-08-11 (×2): 2 g via INTRAVENOUS
  Filled 2017-08-11 (×2): qty 100

## 2017-08-11 MED ORDER — HEPARIN SODIUM (PORCINE) 1000 UNIT/ML IJ SOLN
INTRAMUSCULAR | Status: DC | PRN
  Administered 2017-08-11: 5000 [IU] via INTRAVENOUS

## 2017-08-11 MED ORDER — EVICEL 2 ML EX KIT
PACK | CUTANEOUS | Status: DC | PRN
  Administered 2017-08-11: 2 mL via TOPICAL

## 2017-08-11 MED ORDER — ALBUTEROL SULFATE (2.5 MG/3ML) 0.083% IN NEBU: 2.5000 mg | INHALATION_SOLUTION | RESPIRATORY_TRACT | Status: DC | PRN

## 2017-08-11 MED ORDER — FAMOTIDINE IN NACL 20-0.9 MG/50ML-% IV SOLN
20.0000 mg | Freq: Two times a day (BID) | INTRAVENOUS | Status: DC
  Administered 2017-08-11 (×2): 20 mg via INTRAVENOUS
  Filled 2017-08-11 (×2): qty 50

## 2017-08-11 MED ORDER — MIDAZOLAM HCL 2 MG/2ML IJ SOLN
INTRAMUSCULAR | Status: AC
  Filled 2017-08-11: qty 2

## 2017-08-11 MED ORDER — MORPHINE SULFATE (PF) 2 MG/ML IV SOLN
2.0000 mg | INTRAVENOUS | Status: DC | PRN
  Administered 2017-08-11 (×4): 2 mg via INTRAVENOUS
  Filled 2017-08-11 (×4): qty 1

## 2017-08-11 SURGICAL SUPPLY — 57 items
APPLIER CLIP 11 MED OPEN (CLIP)
APPLIER CLIP 9.375 SM OPEN (CLIP)
BAG COUNTER SPONGE EZ (MISCELLANEOUS) IMPLANT
BAG DECANTER FOR FLEXI CONT (MISCELLANEOUS) ×3 IMPLANT
BLADE SURG 15 STRL LF DISP TIS (BLADE) ×1 IMPLANT
BLADE SURG 15 STRL SS (BLADE) ×2
BLADE SURG SZ11 CARB STEEL (BLADE) ×3 IMPLANT
BOOT SUTURE AID YELLOW STND (SUTURE) ×3 IMPLANT
BRUSH SCRUB EZ  4% CHG (MISCELLANEOUS) ×2
BRUSH SCRUB EZ 4% CHG (MISCELLANEOUS) ×1 IMPLANT
CANISTER SUCT 1200ML W/VALVE (MISCELLANEOUS) ×3 IMPLANT
CLIP APPLIE 11 MED OPEN (CLIP) IMPLANT
CLIP APPLIE 9.375 SM OPEN (CLIP) IMPLANT
COUNTER SPONGE BAG EZ (MISCELLANEOUS)
DERMABOND ADVANCED (GAUZE/BANDAGES/DRESSINGS) ×2
DERMABOND ADVANCED .7 DNX12 (GAUZE/BANDAGES/DRESSINGS) ×1 IMPLANT
DRAPE INCISE IOBAN 66X45 STRL (DRAPES) ×3 IMPLANT
DRAPE LAPAROTOMY 100X77 ABD (DRAPES) ×3 IMPLANT
DURAPREP 26ML APPLICATOR (WOUND CARE) ×3 IMPLANT
ELECT CAUTERY BLADE 6.4 (BLADE) ×3 IMPLANT
ELECT REM PT RETURN 9FT ADLT (ELECTROSURGICAL) ×3
ELECTRODE REM PT RTRN 9FT ADLT (ELECTROSURGICAL) ×1 IMPLANT
GLOVE BIO SURGEON STRL SZ7 (GLOVE) ×6 IMPLANT
GLOVE INDICATOR 7.5 STRL GRN (GLOVE) ×3 IMPLANT
GOWN STRL REUS W/ TWL LRG LVL3 (GOWN DISPOSABLE) ×1 IMPLANT
GOWN STRL REUS W/ TWL XL LVL3 (GOWN DISPOSABLE) ×2 IMPLANT
GOWN STRL REUS W/TWL LRG LVL3 (GOWN DISPOSABLE) ×2
GOWN STRL REUS W/TWL XL LVL3 (GOWN DISPOSABLE) ×4
HEMOSTAT SURGICEL 2X3 (HEMOSTASIS) ×3 IMPLANT
IV NS 500ML (IV SOLUTION) ×2
IV NS 500ML BAXH (IV SOLUTION) ×1 IMPLANT
KIT TURNOVER KIT A (KITS) ×3 IMPLANT
LABEL OR SOLS (LABEL) ×3 IMPLANT
LOOP RED MAXI  1X406MM (MISCELLANEOUS) ×4
LOOP VESSEL MAXI 1X406 RED (MISCELLANEOUS) ×2 IMPLANT
LOOP VESSEL MINI 0.8X406 BLUE (MISCELLANEOUS) ×2 IMPLANT
LOOPS BLUE MINI 0.8X406MM (MISCELLANEOUS) ×4
NS IRRIG 500ML POUR BTL (IV SOLUTION) ×3 IMPLANT
PACK BASIN MAJOR ARMC (MISCELLANEOUS) ×3 IMPLANT
PATCH CAROTID ECM VASC 1X10 (Prosthesis & Implant Heart) ×3 IMPLANT
SUT PROLENE 5 0 RB 1 DA (SUTURE) ×6 IMPLANT
SUT PROLENE 6 0 BV (SUTURE) ×12 IMPLANT
SUT PROLENE 7 0 BV 1 (SUTURE) ×12 IMPLANT
SUT SILK 2 0 (SUTURE) ×2
SUT SILK 2-0 18XBRD TIE 12 (SUTURE) ×1 IMPLANT
SUT SILK 3 0 (SUTURE) ×2
SUT SILK 3-0 18XBRD TIE 12 (SUTURE) ×1 IMPLANT
SUT SILK 4 0 (SUTURE) ×2
SUT SILK 4-0 18XBRD TIE 12 (SUTURE) ×1 IMPLANT
SUT VIC AB 2-0 CT1 27 (SUTURE) ×4
SUT VIC AB 2-0 CT1 TAPERPNT 27 (SUTURE) ×2 IMPLANT
SUT VIC AB 3-0 SH 27 (SUTURE) ×2
SUT VIC AB 3-0 SH 27X BRD (SUTURE) ×1 IMPLANT
SUT VICRYL+ 3-0 36IN CT-1 (SUTURE) ×6 IMPLANT
SYR 20CC LL (SYRINGE) ×3 IMPLANT
SYR 5ML LL (SYRINGE) ×3 IMPLANT
TRAY FOLEY W/METER SILVER 16FR (SET/KITS/TRAYS/PACK) ×3 IMPLANT

## 2017-08-11 NOTE — Anesthesia Preprocedure Evaluation (Signed)
Anesthesia Evaluation  Patient identified by MRN, date of birth, ID band Patient awake    Reviewed: Allergy & Precautions, NPO status , Patient's Chart, lab work & pertinent test results, reviewed documented beta blocker date and time   History of Anesthesia Complications Negative for: history of anesthetic complications  Airway Mallampati: II       Dental  (+) Partial Lower, Missing, Chipped, Poor Dentition   Pulmonary neg sleep apnea, COPD,  COPD inhaler, former smoker,           Cardiovascular hypertension, Pt. on medications and Pt. on home beta blockers + CAD, + Past MI, + Peripheral Vascular Disease and +CHF  + dysrhythmias Atrial Fibrillation + pacemaker + Cardiac Defibrillator      Neuro/Psych neg Seizures Depression    GI/Hepatic Neg liver ROS, GERD  Medicated and Controlled,  Endo/Other  neg diabetes  Renal/GU negative Renal ROS     Musculoskeletal   Abdominal   Peds  Hematology   Anesthesia Other Findings   Reproductive/Obstetrics                             Anesthesia Physical Anesthesia Plan  ASA: III  Anesthesia Plan: General   Post-op Pain Management:    Induction: Intravenous  PONV Risk Score and Plan:   Airway Management Planned: Oral ETT  Additional Equipment:   Intra-op Plan:   Post-operative Plan:   Informed Consent: I have reviewed the patients History and Physical, chart, labs and discussed the procedure including the risks, benefits and alternatives for the proposed anesthesia with the patient or authorized representative who has indicated his/her understanding and acceptance.     Plan Discussed with:   Anesthesia Plan Comments:         Anesthesia Quick Evaluation

## 2017-08-11 NOTE — Anesthesia Procedure Notes (Addendum)
Arterial Line Insertion Start/End3/20/2019 8:00 AM, 08/11/2017 8:00 AM Performed by: Gunnar Fusi, MD, Hedda Slade, CRNA, CRNA  Patient location: OR. Preanesthetic checklist: patient identified, IV checked, site marked, risks and benefits discussed, surgical consent, monitors and equipment checked, pre-op evaluation, timeout performed and anesthesia consent Patient sedated radial was placed  Attempts: 2 (Attempt x1 on LUE with no success by SRNA, attempt x 1 on RUE with success) Procedure performed without using ultrasound guided technique. Following insertion, Biopatch and dressing applied. Post procedure assessment: normal  Patient tolerated the procedure well with no immediate complications.

## 2017-08-11 NOTE — Transfer of Care (Signed)
Immediate Anesthesia Transfer of Care Note  Patient: Tyler [name]  Procedure(s) Performed: ENDARTERECTOMY FEMORAL (Left )  Patient Location: PACU  Anesthesia Type:General  Level of Consciousness: drowsy  Airway & Oxygen Therapy: Patient Spontanous Breathing and Patient connected to face mask oxygen  Post-op Assessment: Report given to RN and Post -op Vital signs reviewed and stable  Post vital signs: Reviewed and stable  Last Vitals:  Vitals:   08/11/17 0622 08/11/17 1004  BP: 119/73 129/84  Pulse: 98 75  Resp: 12 14  Temp: (!) 36.1 C   SpO2: (!) 88% 99%    Last Pain:  Vitals:   08/11/17 0622  TempSrc: Temporal  PainSc: 0-No pain         Complications: No apparent anesthesia complications

## 2017-08-11 NOTE — H&P (Signed)
[facility] History & Physical Update  The patient was interviewed and re-examined.  The patient's previous History and Physical has been reviewed and is unchanged.  There is no change in the plan of care. We plan to proceed with the scheduled procedure.  Leotis Pain, MD  08/11/2017, 7:28 AM

## 2017-08-11 NOTE — Anesthesia Postprocedure Evaluation (Signed)
Anesthesia Post Note  Patient: Tyler [name]  Procedure(s) Performed: ENDARTERECTOMY FEMORAL (Left )  Patient location during evaluation: PACU Anesthesia Type: General Level of consciousness: awake and alert Pain management: pain level controlled Vital Signs Assessment: post-procedure vital signs reviewed and stable Respiratory status: spontaneous breathing and respiratory function stable Cardiovascular status: stable Anesthetic complications: no     Last Vitals:  Vitals:   08/11/17 1040 08/11/17 1049  BP:  123/79  Pulse: 75 76  Resp: 13 14  Temp:    SpO2: 100% 100%    Last Pain:  Vitals:   08/11/17 1040  TempSrc:   PainSc: 6                  KEPHART,WILLIAM K

## 2017-08-11 NOTE — Op Note (Signed)
OPERATIVE NOTE   PROCEDURE: 1. 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 S/p right AKA 3. COPD  POST-OPERATIVE DIAGNOSIS: Same  CO-SURGEONS: Leotis Pain, MD and Hortencia Pilar, MD - co-surgeons  ANESTHESIA: general  ESTIMATED BLOOD LOSS: 20 cc  FINDING(S): 1. significant plaque in left common femoral, profunda femoris, and superficial femoral arteries  SPECIMEN(S): Left common femoral, profunda femoris, and superficial femoral artery plaque.  INDICATIONS:  Patient presents with rest pain of the left foot as well as an ingrown toenail and with ulcer and infection.  Left femoral endarterectomy is planned to try to improve perfusion.Infrainguinal treatment has already been done.  The risks and benefits as well as alternative therapies including intervention were reviewed in detail all questions were answered the patient agrees to proceed with surgery.  DESCRIPTION: After obtaining full informed written consent, the patient was brought back to the operating room and placed supine upon the operating table. The patient received IV antibiotics prior to induction. After obtaining adequate anesthesia, the patient was prepped and draped in the standard fashion appropriate time out is called.   Vertical incision was created overlying the left femoral arteries. The common femoral artery proximally, and superficial femoral artery, and primary profunda femoris artery branches were encircled with vessel loops and prepared for control. The left femoral arteries were found to have significant plaque from the common femoral artery into the profunda and superficial femoral arteries.   5000 units of heparin was given and allowed circulate for 5 minutes.   Attention is then turned to the left femoral artery. An  arteriotomy is made with 11 blade and extended with Potts scissors in the common femoral artery and carried down onto the first 2-3 cm of the superficial femoral artery as this was now patent below the proximal segment after previous intervention. An endarterectomy was then performed. The [facility] was used to create a plane. The proximal endpoint was cut flush with tenotomy scissors. This was in the proximal common femoral artery. An eversion endarterectomy was then performed for the first 2-3 cm of the profunda femorus artery where there was severe proximal disease as well. Good backbleeding was then seen. The distal endpoint of the SFA endarterectomy was created with gentle traction and the distal endpoint was then tacked down with three 7-0 Prolene sutures. A single 7-0 Prolene patch suture was also used to tack down the profunda femorus endarterectomy endpoint. The Cormatrix patcth is then selected and prepared for a patch angioplasty.  It is cut and beveled and started at the proximal endpoint with a 6-0 Prolene suture.  Approximately one half of the suture line is run medially and laterally and the distal end point was cut and bevelled to match the arteriotomy on the superficial femoral artery.  A second 6-0 Prolene was started at the distal end point in the superficial femoral artery and run to the mid portion to complete the arteriotomy.  The vessel was flushed prior to release of control and completion of the anastomosis.  At this point, flow was established first to the profunda femoris artery and then to the superficial femoral artery. Easily palpable pulses are noted well beyond the anastomosis and both arteries.  Surgicel and Evicel topical hemostatic agents were placed in the femoral incision and hemostasis was complete. The femoral incision was then closed in a layered fashion with 2 layers  of 2-0 Vicryl, 2 layers of 3-0 Vicryl, and 4-0 Monocryl for the skin closure. Dermabond and sterile  dressing were then placed over the incision.  The patient was then awakened from anesthesia and taken to the recovery room in stable condition having tolerated the procedure well.  COMPLICATIONS: None  CONDITION: Stable     Leotis Pain 08/11/2017 9:35 AM   This note was created with Dragon Medical transcription system. Any errors in dictation are purely unintentional.

## 2017-08-11 NOTE — OR Nursing (Signed)
Attempted insertion of foley. No urine return. Was instructed by surgeon to discontinue attempt.

## 2017-08-11 NOTE — Op Note (Signed)
OPERATIVE NOTE   PROCEDURE: 1. Left common femoral, superficial femoral and profunda femoris endarterectomy with Cormatrix patch angioplasty  PRE-OPERATIVE DIAGNOSIS: Atherosclerotic occlusive disease left lower extremity with lifestyle limiting claudication and ulceration with infection of the left great toe; hypertension; diabetes mellitus  POST-OPERATIVE DIAGNOSIS: Same  CO-SURGEON: Katha Cabal, MD and Algernon Huxley, M.D.  ASSISTANT(S): None  ANESTHESIA: general  ESTIMATED BLOOD LOSS: 300 cc  FINDING(S): 1. Profound calcific plaque noted of the left common femoral extending past the initial bifurcation of the profunda femoris arteries as well as down the extensive length of the SFA  SPECIMEN(S):  Calcific plaque from the common femoral, superficial femoral and the profunda femoris artery  INDICATIONS:   Tyler [name] 62 y.o. y.o.male who presents with complaints of lifestyle limiting claudication and pain continuously in the left lower extremity. The patient has documented severe atherosclerotic occlusive disease and has undergone minimally invasive treatments in the past. However, at this point his primary area of stricture stenosis resides in the common femoral and origins of the superficial femoral and profunda femoris extending into these arteries and therefore this is not amenable to intervention. The patient is therefore undergoing open endarterectomy. The risks and benefits of surgery have been reviewed with the patient, all questions have answered; alternative therapies have been reviewed as well and the patient has agreed to proceed with surgical open repair.  DESCRIPTION: After obtaining full informed written consent, the patient was brought back to the operating room and placed supine upon the operating table.  The patient received IV antibiotics prior to induction.  After obtaining adequate anesthesia, the patient was  prepped and draped in the standard fashion for left femoral exposure.  Attention was turned to the left groin with Dr. Lucky Cowboy working on the left and myself working on the right of the patient.  Vertical  Incision was made over the left common femoral artery and dissection carried down to the common femoral artery with electrocautery.  I dissected out the common femoral artery from the distal external iliac artery (identified by the superficial circumflex vessels) down to the femoral bifurcation.  On initial inspection, the common femoral artery was: densely calcified and there was no palpable pulse noted.    Subsequently the dissection was continued to include all circumflex branches and the profunda femoral artery and superficial femoral artery. The superficial femoral artery was dissected circumferentially for a distance of approximately 3-4 cm and the profunda femoris was dissected circumferentially out to the fourth order branches individual vessel loops were placed around each branch.  Control of all branches was obtained with vessel loops.  A softer area in the distal external iliac artery amendable to clamping was identified.    The patient was given 5000 units of Heparin intravenously, which was a therapeutic bolus.   After waiting 3 minutes, the distal external iliac artery was clamped and all of the vessel loops were placed under tension.  Arteriotomy was made in the common femoral artery with a 11-blade and extended it with a Potts scissor proximally and distally extending the distal end down the SFA for approximately 3 cm.   Endarterectomy was then performed under direct visualization using a freer elevator and a right angle from the mid common femoral extending up both proximally and distally. Proximally the endarterectomy was brought up to the level of the clamp where a clean edge was obtained. Distally the endarterectomy was carried down to a soft spot in the SFA where a feathered  edge would was  obtained.  7-0 Prolene interrupted tacking sutures were placed to secure the leading edge of the plaque in the SFA.  The profunda femoris was treated with an eversion technique extending endarterectomy approximately 2 cm distally again obtaining a featheredge on both sides right and left.   At this point, a corematrix patch was fashioned for the geometry of the arteriotomy.  The patch was sewn to the artery with 2 running stitches of 6-0 Prolene, running from each end.  Prior to completing the patch angioplasty, the profunda femoral artery was flushed as was the superficial femoral artery. The system was then forward flushed. The endarterectomy site was then irrigated copiously with heparinized saline. The patch angioplasty was completed in the usual fashion.  Flow was then reestablished first to the profunda femoris and then the superficial femoral artery. Any gaps or bleeding sites in the suture line were easily controlled with a 6-0 Prolene suture. Doppler is then delivered onto the field and the SFA as well as the profunda femoris arteries were interrogated and found to have triphasic Doppler signals.  The left groin was then irrigated copiously with sterile saline and subsequently Evicel and Surgicel were placed in the wound. The incision was repaired with a double layer of 2-0 Vicryl, a double layer of 3-0 Vicryl, and a layer of 4-0 Monocryl in a subcuticular fashion.  The skin was cleaned, dried, and reinforced with Dermabond.  COMPLICATIONS: None  CONDITION: Tyler [name], M.D. [facility] Office: [phone]  08/11/2017, 11:08 AM

## 2017-08-11 NOTE — Anesthesia Post-op Follow-up Note (Signed)
Anesthesia QCDR form completed.        

## 2017-08-11 NOTE — Anesthesia Procedure Notes (Addendum)
Procedure Name: Intubation Date/Time: 08/11/2017 7:46 AM Performed by: Gunnar Fusi, MD Pre-anesthesia Checklist: Patient identified, Emergency Drugs available, Suction available, Patient being monitored and Timeout performed Patient Re-evaluated:Patient Re-evaluated prior to induction Oxygen Delivery Method: Circle system utilized Preoxygenation: Pre-oxygenation with 100% oxygen Induction Type: IV induction Ventilation: Mask ventilation without difficulty Laryngoscope Size: Mac and 4 Grade View: Grade I Tube type: Oral Tube size: 7.5 mm Number of attempts: 1 Airway Equipment and Method: Stylet Placement Confirmation: ETT inserted through vocal cords under direct vision,  positive ETCO2,  CO2 detector and breath sounds checked- equal and bilateral Secured at: 21 cm Tube secured with: Tape Dental Injury: Teeth and Oropharynx as per pre-operative assessment  Comments: Intubated by Malcolm Metro

## 2017-08-12 LAB — BASIC METABOLIC PANEL
ANION GAP: 8 (ref 5–15)
BUN: 7 mg/dL (ref 6–20)
CALCIUM: 8.7 mg/dL — AB (ref 8.9–10.3)
CO2: 23 mmol/L (ref 22–32)
Chloride: 99 mmol/L — ABNORMAL LOW (ref 101–111)
Creatinine, Ser: 0.82 mg/dL (ref 0.61–1.24)
GLUCOSE: 108 mg/dL — AB (ref 65–99)
Potassium: 4.1 mmol/L (ref 3.5–5.1)
Sodium: 130 mmol/L — ABNORMAL LOW (ref 135–145)

## 2017-08-12 LAB — CBC
HEMATOCRIT: 33.4 % — AB (ref 40.0–52.0)
Hemoglobin: 11.3 g/dL — ABNORMAL LOW (ref 13.0–18.0)
MCH: 29.3 pg (ref 26.0–34.0)
MCHC: 33.7 g/dL (ref 32.0–36.0)
MCV: 86.7 fL (ref 80.0–100.0)
Platelets: 237 10*3/uL (ref 150–440)
RBC: 3.86 MIL/uL — AB (ref 4.40–5.90)
RDW: 14.9 % — ABNORMAL HIGH (ref 11.5–14.5)
WBC: 8.9 10*3/uL (ref 3.8–10.6)

## 2017-08-12 LAB — PROTIME-INR
INR: 1.09
Prothrombin Time: 14 seconds (ref 11.4–15.2)

## 2017-08-12 NOTE — Care Management (Signed)
HHOT and home palliative requested at time of discharge. RNCM will follow.

## 2017-08-12 NOTE — Evaluation (Signed)
Occupational Therapy Evaluation Patient Details Name: Tyler [name] MRN: [id number] DOB: 1956-02-02 Today's Date: 08/12/2017    History of Present Illness Pt is a 62 y.o. M who presented to hospital on 08/11/17 w/ L foot pain at rest and L ingrown toenail w/ ulcer and infection. Pt admitted 08/11/17 w/ diagnosis of atherosclerotic occlusive disease LLE w/ rest pain, s/p R AKA, and COPD. Procedure performed 08/11/17 L common femmoral artery endarterectomie and patch angioplasty. PMHx includes: pacemaker, PVD, HTN, MI, CAD, COPD, Chronic systolic CHF, AFIB, PAD, R AKA.    Clinical Impression   Pt seen for OT evaluation and treatment this date. Prior to admission, pt has been primarily w/c bound since December 2018 due to impaired sensation in BUE causing him to lose grip of the crutches he was using to ambulate with. Able to perform w/c transfers independently, has home O2 2L, and has required increasing assist from spouse for bathing, dressing, and toileting hygiene/clothing mgt due to impaired BUE sensation. Pt self-reports as an independent person and became tearful in recounting how much assist his spouse provides him and states "I hate that for her, I apologize all the time." Pt's spouse assists with sponge bathing while pt is seated on a shower chair in the bathroom (unable to use shower recently due to fear/risk of falling in shower - sliding glass shower doors make it challenging and there isn't enough room for a w/c transfer to the shower chair in the actual shower. Pt notes taking a shower in the actual shower would be something he would love to be able to do and he notes that his spouse and he have thought recently about removing shower doors. Pt endorses becoming fatigued very quickly at home during basic ADL tasks such as brushing his teeth. "I'll get so winded afterwards that I'll have to stop and rest for 5-10 minutes before I try to go back to the living room."   Currently, pt presents with  impaired activity tolerance (worse than impairment at baseline), his cardiopulmonary status is impacting his ability to perform mobility, ADL tasks, and carry on a simply conversation (dropped down into mid 80's with BUE MMT), and his impaired BUE sensation is impairing his ability to grasp, carry, manipulate objects such as toilet paper for perianal care, hold a coffee mug without spilling/dropping, and hold and use a remote control among other things. Pt required CGA to min assist x2 (for lines/leads/equipment) for a stand pivot transfer from the [facility] to the recliner. Pt education/training provided in pursed lip breathing, energy conservation principles, compensatory strategies for manipulating objects, and AE/DME to trial in order to maximize his independence with ADL/IADL, functional mobility, minimize risk of over exertion, minimize falls risk, and minimize caregiver burden. Pt will benefit from skilled OT services to address noted impairments and functional deficits in order to maximize return to and/or improve upon PLOF in order to also maximize pt's quality of life. Pt stands to substantially benefit from skilled [facility] services to address functional mobility, routines, falls risks/home safety, trial AE/DME to improve pt's participation in mobility/ADL, assist in implementing energy conservation strategies, and work towards pt's specific goals identified (I.e., returning to seated showers in the shower, decreasing reliance on spouse's assistance for self care tasks such as toileting hygiene, be able to enjoy a meal out with his wife, etc.). Pt expressed gratitude for education/training provided this date. Will continue to progress while admitted to this hospital.    Follow Up Recommendations  Home health OT(also  consider HHA/part time caregiver to assist)    Equipment Recommendations  Other (comment)(TBD)    Recommendations for Other Services       Precautions / Restrictions  Precautions Precautions: Fall Restrictions Weight Bearing Restrictions: Yes RLE Weight Bearing: Non weight bearing Other Position/Activity Restrictions: hx R AKA      Mobility Bed Mobility     General bed mobility comments: deferred, up to [facility] at start of OT session, in recliner at end of session  Transfers Overall transfer level: Needs assistance   Transfers: Stand Pivot Transfers   Stand pivot transfers: Min guard;+2 safety/equipment;Min assist       General transfer comment: min assist due to getting gown stuck between hand and arm rest of BSC, requiring min assist to ensure safety    Balance Overall balance assessment: Needs assistance Sitting-balance support: No upper extremity supported Sitting balance-Leahy Scale: Normal Sitting balance - Comments: Pt demonstrated normal sitting balance at EOB while weight shifting managing clothing and lines when reaching outside of BOS.   Standing balance support: Bilateral upper extremity supported Standing balance-Leahy Scale: Poor Standing balance comment: Pt required holding onto bed rail and BSC handle bars when performing stand pivot transfer from EOB to bedside commode.                           ADL either performed or assessed with clinical judgement   ADL Overall ADL's : Needs assistance/impaired Eating/Feeding: Sitting;Modified independent Eating/Feeding Details (indicate cue type and reason): pt notes he will regularly drop or almost drop utensils and cups if he's not "careful" and visually attending due to impaired sensation; pt instructed in modified way to hold coffee mugs to minimize risk of spilling; discussed possibility of  Grooming: Sitting;Modified independent Grooming Details (indicate cue type and reason): becomes very fatigued and drop in O2 sats into mid-80's with minimal seated grooming tasks; requires verbal cues for pursed lip breathing and activity pacing/rest breaks to minimize SOB and  maximize O2 sats on 2L O2 Upper Body Bathing: Sitting;Minimal assistance   Lower Body Bathing: Sit to/from stand;Moderate assistance Lower Body Bathing Details (indicate cue type and reason): would benefit in instruction for modifications to improve independence, safety, and energy use efficiency Upper Body Dressing : Sitting;Modified independent Upper Body Dressing Details (indicate cue type and reason): PRN min assist for UB dressing at baseline depending on the clothing- spouse will help pull shirts down in the back due to pt's impaired sensation in hands  Lower Body Dressing: Sit to/from stand;Moderate assistance Lower Body Dressing Details (indicate cue type and reason): would benefit in instruction for modifications to improve independence, safety, and energy use efficiency Toilet Transfer: RW;Minimal assistance;Stand-pivot;BSC;+2 for safety/equipment Toilet Transfer Details (indicate cue type and reason): difficulty due to multiple lines/leads and impaired sensation in hands, the gown was caught between his hand and the arm rest which caused him difficulty with transfer requiring min assist and +1 for lines/leads                 Vision Baseline Vision/History: Wears glasses Wears Glasses: Reading only Patient Visual Report: No change from baseline Vision Assessment?: No apparent visual deficits     Perception     Praxis      Pertinent Vitals/Pain Pain Assessment: 0-10 Pain Score: 3  Pain Location: L hip incision site Pain Descriptors / Indicators: Sore Pain Intervention(s): Limited activity within patient's tolerance;Monitored during session;Repositioned     Hand Dominance Right  Extremity/Trunk Assessment Upper Extremity Assessment Upper Extremity Assessment: RUE deficits/detail;LUE deficits/detail RUE Deficits / Details: strength WNL, impaired sensation t/o (at baseline) RUE Sensation: history of peripheral neuropathy LUE Deficits / Details: strength WNL,  impaired sensation t/o (at baseline) LUE Sensation: history of peripheral neuropathy   Lower Extremity Assessment Lower Extremity Assessment: Defer to PT evaluation;RLE deficits/detail;LLE deficits/detail RLE Deficits / Details: R AKA LLE Deficits / Details: Pt demonstrated LLE strength at least 3/5 when bringing knee to chest. LLE: Unable to fully assess due to pain LLE Sensation: history of peripheral neuropathy   Cervical / Trunk Assessment Cervical / Trunk Assessment: Normal   Communication Communication Communication: No difficulties   Cognition Arousal/Alertness: Awake/alert Behavior During Therapy: WFL for tasks assessed/performed Overall Cognitive Status: Within Functional Limits for tasks assessed                                 General Comments: Pt A&O x4   General Comments       Exercises Other Exercises Other Exercises: Pt instructed in pursed lip breathing with review of importance and benefits with pt requiring verbal cues t/o session to utilize Other Exercises: Pt instructed in basic energy conservation principles including PLB, home/routines modifications, activity pacing, work simplification, and DME/AE to maximize safety and independence which is important to him Other Exercises: Pt instructed in adaptive and compensatory strategies for ADL tasks to minimize impact of BUE sensory loss when performing tasks (i.e., bottom buddy, grippy gloves, modifications to tools like remote control at home to improve grip/texture to decrease risk of dropping)   Shoulder Instructions      Home Living Family/patient expects to be discharged to:: Private residence Living Arrangements: Spouse/significant other Available Help at Discharge: Family;Available 24 hours/day Type of Home: Mobile home Home Access: Ramped entrance     Home Layout: One level     Bathroom Shower/Tub: Walk-in shower;Door   Bathroom Toilet: Standard(std height, has bilateral  rails) Bathroom Accessibility: Yes How Accessible: Accessible via wheelchair;Accessible via walker Home Equipment: Transport chair;Shower seat;Grab bars - tub/shower;Crutches;Hand held shower head;Wheelchair - manual   Additional Comments: Pt reports that he has been wheelchair bound since Dec. 2018, per neurologist request, due to spinal stenosis and decreased sensation in hands that makes it too difficult to use crutches.      Prior Functioning/Environment Level of Independence: Needs assistance  Gait / Transfers Assistance Needed: Pt reports he uses WC for all ambulation. Indep with transfers. ADL's / Homemaking Assistance Needed: Pt reports wife helps him with toileting hygiene/clothing mgt, bathing, med mgt PRN (when decreased BUE sensation makes opening medicine bottles too difficult), cooking/cleaning, and driving   Comments: no falls in past 12 months, pt attempts to be very careful due to being on a blood thinner and an MD telling him that he could become paralyzed if he were to fall due to cervical spinal stenosis        OT Problem List: Decreased knowledge of use of DME or AE;Impaired UE functional use;Impaired sensation;Impaired balance (sitting and/or standing);Decreased activity tolerance;Cardiopulmonary status limiting activity      OT Treatment/Interventions: Self-care/ADL training;Balance training;Therapeutic exercise;Therapeutic activities;Energy conservation;DME and/or AE instruction;Patient/family education    OT Goals(Current goals can be found in the care plan section) Acute Rehab OT Goals Patient Stated Goal: I want to improve and not be burden on my wife Time For Goal Achievement: 08/26/17 Potential to Achieve Goals: Good ADL Goals Pt Will Perform  Lower Body Dressing: with min guard assist;sitting/lateral leans;sit to/from stand(using compensatory strategies, AE as needed) Additional ADL Goal #1: Pt will demonstrate use of PLB during ADL tasks in order to maintain  O2 sats on 2L O2 >90% t/o, 5/5 opportunities. Additional ADL Goal #2: Pt will verbalize plan for implementing at least 1 learned ECS to support maximal independence and safety with mobility and ADL at home.  OT Frequency: Min 2X/week   Barriers to D/C:            Co-evaluation              AM-PAC PT "6 Clicks" Daily Activity     Outcome Measure Help from another person eating meals?: A Little Help from another person taking care of personal grooming?: A Little Help from another person toileting, which includes using toliet, bedpan, or urinal?: A Little Help from another person bathing (including washing, rinsing, drying)?: A Lot Help from another person to put on and taking off regular upper body clothing?: A Little Help from another person to put on and taking off regular lower body clothing?: A Lot 6 Click Score: 16   End of Session Equipment Utilized During Treatment: Gait belt;Oxygen  Activity Tolerance: Patient tolerated treatment well Patient left: in chair;with call bell/phone within reach  OT Visit Diagnosis: Other abnormalities of gait and mobility (R26.89);Other symptoms and signs involving the nervous system (R29.898)                Time: 6213-0865 OT Time Calculation (min): 63 min Charges:  OT General Charges $OT Visit: 1 Visit OT Evaluation $OT Eval Low Complexity: 1 Low OT Treatments $Self Care/Home Management : 38-52 mins  Jeni Salles, MPH, MS, OTR/L ascom [phone] 08/12/17, 1:53 PM

## 2017-08-12 NOTE — Progress Notes (Signed)
Please note, patient is currently followed by out patient PALLIATIVE at home. CMRN Marshell Garfinkel made aware. Flo Shanks RN, BSN, [facility] [place], hospital liaison [phone]

## 2017-08-12 NOTE — Progress Notes (Signed)
[facility]  Daily Progress Note   Subjective  - 1 Day Post-Op  Doing well.  Soreness as expected.  Breathing seems to be at baseline.  Objective Vitals:   08/12/17 1200 08/12/17 1300 08/12/17 1400 08/12/17 1500  BP: 100/60 101/63 100/73 100/62  Pulse: 77 75 74 75  Resp: 16 (!) 21 (!) 22 14  Temp:      TempSrc:      SpO2: 100% 98% 99% 100%  Weight:      Height:        Intake/Output Summary (Last 24 hours) at 08/12/2017 1535 Last data filed at 08/12/2017 1400 Gross per 24 hour  Intake 2198.33 ml  Output 2570 ml  Net -371.67 ml    PULM  CTAB CV  RRR VASC  Incision c/D/I. Palpable left PT pulse  Laboratory CBC    Component Value Date/Time   WBC 8.9 08/11/2017 2358   HGB 11.3 (L) 08/11/2017 2358   HGB 13.2 01/12/2014 1153   HCT 33.4 (L) 08/11/2017 2358   HCT 39.1 (L) 01/12/2014 1153   PLT 237 08/11/2017 2358   PLT 239 01/12/2014 1153    BMET    Component Value Date/Time   NA 130 (L) 08/11/2017 2358   NA 134 (L) 01/12/2014 1153   K 4.1 08/11/2017 2358   K 4.0 01/12/2014 1153   CL 99 (L) 08/11/2017 2358   CL 96 (L) 01/12/2014 1153   CO2 23 08/11/2017 2358   CO2 31 01/12/2014 1153   GLUCOSE 108 (H) 08/11/2017 2358   GLUCOSE 97 01/12/2014 1153   BUN 7 08/11/2017 2358   BUN 7 01/12/2014 1153   CREATININE 0.82 08/11/2017 2358   CREATININE 0.92 01/12/2014 1153   CALCIUM 8.7 (L) 08/11/2017 2358   CALCIUM 9.2 01/12/2014 1153   GFRNONAA >60 08/11/2017 2358   GFRNONAA >60 01/12/2014 1153   GFRAA >60 08/11/2017 2358   GFRAA >60 01/12/2014 1153    Assessment/Planning: POD #1 s/p left femoral endarterectomy   Doing well  Can go to the floor  Likely discharge home tomorrow    Leotis Pain  08/12/2017, 3:35 PM

## 2017-08-12 NOTE — Progress Notes (Signed)
Dr Lucky Cowboy gave order that patient can be transferred out to any floor, no telemonitoring needed, activity as tolerated.

## 2017-08-12 NOTE — Evaluation (Signed)
Physical Therapy Evaluation Patient Details Name: Tyler [name] MRN: [id number] DOB: Dec 10, 1955 Today's Date: 08/12/2017   History of Present Illness  Pt is a 62 y.o. M who presented to hospital on 08/11/17 w/ L foot pain at rest and L ingrown toenail w/ ulcer and infection. Pt admitted 08/11/17 w/ diagnosis of atherosclerotic occlusive disease LLE w/ rest pain, s/p R AKA, and COPD. Procedure performed 08/11/17 L common femmoral artery endarterectomie and patch angioplasty. PMHx includes: pacemaker, PVD, HTN, MI, CAD, COPD, Chronic systolic CHF, AFIB, PAD,R AKA      Clinical Impression  Pt demonstrated bed mobility with supervision, transfers w/ CGA x 1 and SBA x 1 to manage lines (see mobility and transfers for details). Pt reports he has been WC bound since 04/2017 (see home living for details). Pt demonstrates BUE AROM and strength WFL, but reports BUE decreased sensation in hands due to spinal stenosis.  OT has assessed and reports that they will have HHOT recommendations to address ADL requirements. Recommend discharge to home; no further PT needs anticipated upon hospital discharge.    Follow Up Recommendations No PT follow up    Equipment Recommendations  3in1 (PT);Wheelchair (measurements PT)    Recommendations for Other Services OT consult     Precautions / Restrictions Precautions Precautions: Fall Restrictions Weight Bearing Restrictions: No      Mobility  Bed Mobility Overal bed mobility: Needs Assistance Bed Mobility: Supine to Sit     Supine to sit: Supervision     General bed mobility comments: Pt demonstrated supine to sit w/ good stability and supervision.  Transfers Overall transfer level: Needs assistance   Transfers: Stand Pivot Transfers   Stand pivot transfers: Min guard;+2 safety/equipment       General transfer comment: Pt demonstrated stand pivot transfer from bed to [facility] w/ CGA x1 and SBA x1 to manage lines.  Ambulation/Gait                Stairs            Wheelchair Mobility    Modified Rankin (Stroke Patients Only)       Balance Overall balance assessment: Needs assistance Sitting-balance support: No upper extremity supported Sitting balance-Leahy Scale: Normal Sitting balance - Comments: Pt demonstrated normal sitting balance at EOB while weight shifting managing clothing and lines when reaching outside of BOS.   Standing balance support: Bilateral upper extremity supported Standing balance-Leahy Scale: Poor Standing balance comment: Pt required holding onto bed rail and BSC handle bars when performing stand pivot transfer from EOB to bedside commode.                             Pertinent Vitals/Pain Pain Assessment: 0-10 Pain Score: 5  Pain Location: L hip incision site Pain Descriptors / Indicators: Sore Pain Intervention(s): Limited activity within patient's tolerance;Monitored during session;Premedicated before session    Home Living Family/patient expects to be discharged to:: Private residence Living Arrangements: Spouse/significant other Available Help at Discharge: Family Type of Home: Mobile home Home Access: Ramped entrance     Home Layout: One level Home Equipment: transport chair Additional Comments: Pt reports that he has been wheelchair bound since Dec. 2018, per neurologist request, due to spinal stenosis and decreased sensation in hands that makes it too difficult to use crutches.    Prior Function Level of Independence: Needs assistance   Gait / Transfers Assistance Needed: Pt reports he uses WC for all  ambulation.  ADL's / Homemaking Assistance Needed: Pt reports wife helps him with toileting and bathing.        Hand Dominance        Extremity/Trunk Assessment   Upper Extremity Assessment Upper Extremity Assessment: RUE deficits/detail;LUE deficits/detail RUE Deficits / Details: Pt demonstrated BUE AROM WNL when reaching overhead; Pt demonstrated BUE  strength 4+/5 w/ MMT. RUE Sensation: history of peripheral neuropathy LUE Deficits / Details: Pt demonstrated BUE AROM WNL when reaching overhead; Pt demonstrated BUE strength 4+/5 w/ MMT. LUE Sensation: history of peripheral neuropathy    Lower Extremity Assessment Lower Extremity Assessment: RLE deficits/detail;LLE deficits/detail RLE Deficits / Details: R AKA LLE Deficits / Details: Pt demonstrated LLE strength at least 3/5 when bringing knee to chest. LLE: Unable to fully assess due to pain LLE Sensation: history of peripheral neuropathy    Cervical / Trunk Assessment Cervical / Trunk Assessment: Normal  Communication   Communication: No difficulties  Cognition Arousal/Alertness: Awake/alert Behavior During Therapy: WFL for tasks assessed/performed Overall Cognitive Status: Within Functional Limits for tasks assessed                                 General Comments: Pt A&O x4      General Comments      Exercises     Assessment/Plan    PT Assessment Patient needs continued PT services  PT Problem List Decreased balance;Decreased mobility;Pain;Decreased knowledge of use of DME;Impaired sensation;Decreased strength;Decreased range of motion;Decreased activity tolerance;Decreased coordination;Decreased safety awareness;Decreased knowledge of precautions       PT Treatment Interventions Functional mobility training;Therapeutic activities;Therapeutic exercise;Balance training;Patient/family education    PT Goals (Current goals can be found in the Care Plan section)  Acute Rehab PT Goals Patient Stated Goal: I want to go home. PT Goal Formulation: With patient Time For Goal Achievement: 08/26/17 Potential to Achieve Goals: Good    Frequency Min 2X/week   Barriers to discharge        Co-evaluation               AM-PAC PT "6 Clicks" Daily Activity  Outcome Measure Difficulty turning over in bed (including adjusting bedclothes, sheets and  blankets)?: None Difficulty moving from lying on back to sitting on the side of the bed? : A Little Difficulty sitting down on and standing up from a chair with arms (e.g., wheelchair, bedside commode, etc,.)?: Unable Help needed moving to and from a bed to chair (including a wheelchair)?: A Little Help needed walking in hospital room?: Total Help needed climbing 3-5 steps with a railing? : Total 6 Click Score: 17    End of Session Equipment Utilized During Treatment: Gait belt;Oxygen Activity Tolerance: Patient tolerated treatment well Patient left: in chair;with call bell/phone within reach(on [facility] w/ OT in the room. OT reportedly transferred Pt to chair that had a chair alarm in it, but it could not be activated in ICU room; RN notified.) Nurse Communication: Mobility status PT Visit Diagnosis: Other abnormalities of gait and mobility (R26.89);Difficulty in walking, not elsewhere classified (R26.2);Pain Pain - Right/Left: Left Pain - part of body: Hip    Time: 4818-5631 PT Time Calculation (min) (ACUTE ONLY): 34 min   Charges:         PT G Codes:         Montzerrat Brunell Mondrian-Pardue, SPT 08/12/2017, 11:07 AM

## 2017-08-13 LAB — SURGICAL PATHOLOGY

## 2017-08-13 MED ORDER — OXYCODONE-ACETAMINOPHEN 5-325 MG PO TABS: ORAL_TABLET | ORAL | 0 refills | Status: DC

## 2017-08-13 NOTE — Discharge Summary (Signed)
[facility]    Discharge Summary  Patient ID:  Tyler [name] MRN: [id number] DOB/AGE: 07/20/1955 62 y.o.  Admit date: 08/11/2017 Discharge date: 08/13/2017 Date of Surgery: 08/11/2017 Surgeon: Surgeon(s): [name], Erskine Squibb, MD Schnier, Dolores Lory, MD  Admission Diagnosis: ASO WITH REST PAIN  Discharge Diagnoses:  ASO WITH REST PAIN  Secondary Diagnoses: Past Medical History:  Diagnosis Date  . AICD (automatic cardioverter/defibrillator) present   . Arthritis   . Atrial fibrillation ([place])   . Cervical spinal stenosis    with neuropathy  . CHF (congestive heart failure) ([place])   . COPD (chronic obstructive pulmonary disease) ([place])   . Coronary artery disease   . Depression   . Dyspnea   . GERD (gastroesophageal reflux disease)   . Hypertension   . Myocardial infarction ([place])   . Peripheral vascular disease ([place])   . Presence of permanent cardiac pacemaker    Procedure(s): ENDARTERECTOMY FEMORAL  Discharged Condition: good  HPI:  The patient is a 62 year old male with a past medical history of severe peripheral artery disease status post a left common femoral, profunda femoris, and superficial femoral artery endarterectomies and patch angioplasty on 08/11/17.  The patient's line of surgery was unremarkable.  The patient's brief inpatient stay was unremarkable.  The patient's diet was advanced, urinating without issue, his pain was controlled with PO medication and he was ambulating at his baseline upon discharge.  Hospital Course:  Tyler [name] is a 62 y.o. male is S/P Left  Procedure(s): ENDARTERECTOMY FEMORAL  Extubated: POD # 0  Physical exam:  A&Ox3, NAD CV: RRR Pulm: CTA Bilaterally Abdomen: Soft, nontender, nondistended positive bowel sounds Left groin: A wire dressing removed.  Incision clean dry and intact.  No drainage.  No swelling.  Dermabond intact. Left lower extremity: Thigh soft, Soft.  Hard to palpate pedal pulses  however the foot is warm.  Good capillary refill. Right lower extremity: Amputation site without issue.  Post-op wounds clean, dry, intact or healing well  Pt. Ambulating, voiding and taking PO diet without difficulty.  Pt pain controlled with PO pain meds.  Labs as below  Complications:none  Consults: None  Significant Diagnostic Studies: CBC Lab Results  Component Value Date   WBC 8.9 08/11/2017   HGB 11.3 (L) 08/11/2017   HCT 33.4 (L) 08/11/2017   MCV 86.7 08/11/2017   PLT 237 08/11/2017   BMET    Component Value Date/Time   NA 130 (L) 08/11/2017 2358   NA 134 (L) 01/12/2014 1153   K 4.1 08/11/2017 2358   K 4.0 01/12/2014 1153   CL 99 (L) 08/11/2017 2358   CL 96 (L) 01/12/2014 1153   CO2 23 08/11/2017 2358   CO2 31 01/12/2014 1153   GLUCOSE 108 (H) 08/11/2017 2358   GLUCOSE 97 01/12/2014 1153   BUN 7 08/11/2017 2358   BUN 7 01/12/2014 1153   CREATININE 0.82 08/11/2017 2358   CREATININE 0.92 01/12/2014 1153   CALCIUM 8.7 (L) 08/11/2017 2358   CALCIUM 9.2 01/12/2014 1153   GFRNONAA >60 08/11/2017 2358   GFRNONAA >60 01/12/2014 1153   GFRAA >60 08/11/2017 2358   GFRAA >60 01/12/2014 1153   COAG Lab Results  Component Value Date   INR 1.09 08/12/2017   INR 1.11 08/11/2017   INR 2.02 08/02/2017   Disposition:  Discharge to :Home  Allergies as of 08/13/2017      Reactions   Apixaban Rash   Rivaroxaban Rash  Medication List    TAKE these medications   acetaminophen 500 MG tablet Commonly known as:  TYLENOL Take 1,000 mg by mouth daily as needed for moderate pain or headache.   ALPRAZolam 1 MG tablet Commonly known as:  XANAX Take 1 mg by mouth 2 (two) times daily.   aspirin EC 81 MG tablet Take 1 tablet (81 mg total) by mouth daily. What changed:    when to take this  additional instructions   COMBIVENT RESPIMAT 20-100 MCG/ACT Aers respimat Generic drug:  Ipratropium-Albuterol Inhale 1 puff into the lungs every 4 (four) hours.    docusate sodium 100 MG capsule Commonly known as:  COLACE Take 100 mg by mouth at bedtime as needed (for constipation.).   fluticasone 50 MCG/ACT nasal spray Commonly known as:  FLONASE Place 2 sprays into both nostrils daily.   fluticasone-salmeterol 115-21 MCG/ACT inhaler Commonly known as:  ADVAIR HFA USE 2 INHALATIONS ORALLY   EVERY 12 HOURS   furosemide 20 MG tablet Commonly known as:  LASIX Take 10 mg by mouth daily as needed (for fluid retention.).   guaiFENesin 600 MG 12 hr tablet Commonly known as:  MUCINEX Take 600 mg by mouth 2 (two) times daily.   ipratropium 0.03 % nasal spray Commonly known as:  ATROVENT Place 2 sprays into both nostrils 2 (two) times daily.   levalbuterol 1.25 MG/3ML nebulizer solution Commonly known as:  XOPENEX Take 1.25 mg (3 mLs total) by nebulization every 4 (four) hours. What changed:    when to take this  reasons to take this   loratadine 10 MG tablet Commonly known as:  CLARITIN Take 10 mg by mouth at bedtime.   lovastatin 20 MG tablet Commonly known as:  MEVACOR Take 20 mg by mouth at bedtime.   metoprolol succinate 25 MG 24 hr tablet Commonly known as:  TOPROL-XL Take 25 mg by mouth daily.   nitroGLYCERIN 0.4 MG SL tablet Commonly known as:  NITROSTAT Place 0.4 mg under the tongue every 5 (five) minutes as needed for chest pain.   nystatin ointment Commonly known as:  MYCOSTATIN Apply 1 application topically daily as needed (rash). Applied to corners of mouth   oxyCODONE-acetaminophen 5-325 MG tablet Commonly known as:  PERCOCET/ROXICET One to Two Tabs By Mouth Every Six Hours As Needed For Pain What changed:    how much to take  how to take this  when to take this  reasons to take this  additional instructions   pantoprazole 40 MG tablet Commonly known as:  PROTONIX Take 40 mg by mouth daily before breakfast.   ramipril 10 MG capsule Commonly known as:  ALTACE Take 10 mg by mouth at bedtime.    Tiotropium Bromide Monohydrate 1.25 MCG/ACT Aers Commonly known as:  SPIRIVA RESPIMAT Inhale 2 puffs into the lungs daily.   vitamin B-12 1000 MCG tablet Commonly known as:  CYANOCOBALAMIN Take 1,000 mcg by mouth daily.   B-12 1000 MCG/ML Kit Inject 1,000 mcg as directed every 30 (thirty) days.   warfarin 5 MG tablet Commonly known as:  COUMADIN Take 5 mg by mouth at bedtime.      Verbal and written Discharge instructions given to the patient. Wound care per Discharge AVS Follow-up Information    Arryn Terrones, Janalyn Harder, PA-C In 1 week.   Specialty:  Physician Assistant Why:  First Post-op. Incision Check. Patient would prefer afternoon appointment. Appointment scheduled for  April 1at 2pm with Sherren Mocha information: [place]  [place] [phone]          Signed: Sela Hua, PA-C  08/13/2017, 3:24 PM

## 2017-08-17 ENCOUNTER — Telehealth (INDEPENDENT_AMBULATORY_CARE_PROVIDER_SITE_OTHER): Payer: Self-pay

## 2017-08-17 DIAGNOSIS — I482 Chronic atrial fibrillation: Secondary | ICD-10-CM | POA: Diagnosis not present

## 2017-08-17 DIAGNOSIS — J431 Panlobular emphysema: Secondary | ICD-10-CM | POA: Diagnosis not present

## 2017-08-17 DIAGNOSIS — J40 Bronchitis, not specified as acute or chronic: Secondary | ICD-10-CM | POA: Diagnosis not present

## 2017-08-17 DIAGNOSIS — J9611 Chronic respiratory failure with hypoxia: Secondary | ICD-10-CM | POA: Diagnosis not present

## 2017-08-17 NOTE — Telephone Encounter (Signed)
Patient's wife called stating the patient's left leg was swelling after his surgery on 08/11/17 and the wife wanted to know if he could wear his compression hose. Per Hezzie Bump  PA the patient can wear his compression hose and elevate his leg as well. Patient was informed of the PA's recommendations.

## 2017-08-18 ENCOUNTER — Ambulatory Visit: Payer: Medicare Other

## 2017-08-23 ENCOUNTER — Ambulatory Visit (INDEPENDENT_AMBULATORY_CARE_PROVIDER_SITE_OTHER): Payer: Medicare HMO | Admitting: Vascular Surgery

## 2017-08-23 ENCOUNTER — Encounter (INDEPENDENT_AMBULATORY_CARE_PROVIDER_SITE_OTHER): Payer: Self-pay | Admitting: Vascular Surgery

## 2017-08-23 VITALS — BP 118/67 | HR 78 | Resp 14 | Ht 71.0 in | Wt 143.0 lb

## 2017-08-23 DIAGNOSIS — Z89611 Acquired absence of right leg above knee: Secondary | ICD-10-CM

## 2017-08-23 DIAGNOSIS — I739 Peripheral vascular disease, unspecified: Secondary | ICD-10-CM

## 2017-08-23 DIAGNOSIS — J441 Chronic obstructive pulmonary disease with (acute) exacerbation: Secondary | ICD-10-CM | POA: Diagnosis not present

## 2017-08-23 NOTE — Progress Notes (Signed)
Subjective:    Patient ID: Tyler [name], male    DOB: 1955/08/18, 62 y.o.   MRN: [id number] Chief Complaint  Patient presents with  . Follow-up    1 week [facility] f/u   Patient presents for his first post operative follow-up.  The patient is status post left common femoral, profunda femoris, and superficial femoral artery endarterectomies and patch angioplasty on August 11, 2017.  The patient's postoperative course has been unremarkable.  The patient denies any issues with his left groin incision.  The patient is experiencing pain to the nailbed on the left first toe.  There is also a callus located to the lateral aspect which seems to cause him some discomfort as well.  The patient notes some soreness and swelling to the leg status post surgery however overall states he is doing well.  The patient denies any fever, nausea vomiting.  Review of Systems  Constitutional: Negative.   HENT: Negative.   Eyes: Negative.   Respiratory: Negative.   Cardiovascular:       PAD  Gastrointestinal: Negative.   Endocrine: Negative.   Genitourinary: Negative.   Musculoskeletal: Negative.   Skin: Negative.   Neurological: Negative.   Hematological: Negative.   Psychiatric/Behavioral: Negative.       Objective:   Physical Exam  Constitutional: He is oriented to person, place, and time. He appears well-developed and well-nourished. No distress.  HENT:  Head: Normocephalic and atraumatic.  Eyes: Conjunctivae are normal.  Neck: Normal range of motion.  Cardiovascular: Normal rate, regular rhythm, normal heart sounds and intact distal pulses.  Pulses:      Radial pulses are 2+ on the right side, and 2+ on the left side.  Right: BKA Left lower extremity: Hard to palpate pedal pulses however his foot is warm.  Pulmonary/Chest: Effort normal and breath sounds normal.  Musculoskeletal: Normal range of motion. He exhibits edema (Mild to moderate minimally pitting edema noted to the left lower  extremity).  Neurological: He is alert and oriented to person, place, and time.  Skin: He is not diaphoretic.  Skin is intact.  There is no ulceration noted.  There is no cellulitis noted. Left groin incision: Incision is healing well.  There is no drainage or swelling noted.  Psychiatric: He has a normal mood and affect. His behavior is normal. Judgment and thought content normal.  Vitals reviewed.  BP 118/67 (BP Location: Right Arm, Patient Position: Sitting)   Pulse 78   Resp 14   Ht _0  (1.803 m)   Wt 143 lb (64.9 kg)   BMI 19.94 kg/m   Past Medical History:  Diagnosis Date  . AICD (automatic cardioverter/defibrillator) present   . Arthritis   . Atrial fibrillation ([place])   . Cervical spinal stenosis    with neuropathy  . CHF (congestive heart failure) ([place])   . COPD (chronic obstructive pulmonary disease) ([place])   . Coronary artery disease   . Depression   . Dyspnea   . GERD (gastroesophageal reflux disease)   . Hypertension   . Myocardial infarction ([place])   . Peripheral vascular disease ([place])   . Presence of permanent cardiac pacemaker    Social History   Socioeconomic History  . Marital status: Married    Spouse name: Not on file  . Number of children: Not on file  . Years of education: Not on file  . Highest education level: Not on file  Occupational History  . Not on file  Social Needs  .  Financial resource strain: Not on file  . Food insecurity:    Worry: Not on file    Inability: Not on file  . Transportation needs:    Medical: Not on file    Non-medical: Not on file  Tobacco Use  . Smoking status: Former Smoker    Packs/day: 1.00    Years: 42.00    Pack years: 42.00    Types: Cigarettes    Last attempt to quit: 09/23/2013    Years since quitting: 3.9  . Smokeless tobacco: Never Used  Substance and Sexual Activity  . Alcohol use: No  . Drug use: No  . Sexual activity: Not on file  Lifestyle  . Physical activity:    Days per week: Not on  file    Minutes per session: Not on file  . Stress: Not on file  Relationships  . Social connections:    Talks on phone: Not on file    Gets together: Not on file    Attends religious service: Not on file    Active member of club or organization: Not on file    Attends meetings of clubs or organizations: Not on file    Relationship status: Not on file  . Intimate partner violence:    Fear of current or ex partner: Not on file    Emotionally abused: Not on file    Physically abused: Not on file    Forced sexual activity: Not on file  Other Topics Concern  . Not on file  Social History Narrative  . Not on file   Past Surgical History:  Procedure Laterality Date  . ABOVE KNEE LEG AMPUTATION Right    after below the knee amputation   . BELOW KNEE LEG AMPUTATION Right   . CATARACT EXTRACTION, BILATERAL Bilateral   . ENDARTERECTOMY FEMORAL Left 08/11/2017   Procedure: ENDARTERECTOMY FEMORAL;  Surgeon: Algernon Huxley, MD;  Location: [facility] ORS;  Service: Vascular;  Laterality: Left;  . IMPLANTABLE CARDIOVERTER DEFIBRILLATOR (ICD) GENERATOR CHANGE Left 02/10/2017   Procedure: ICD GENERATOR CHANGE;  Surgeon: Isaias Cowman, MD;  Location: [facility] ORS;  Service: Cardiovascular;  Laterality: Left;  . INSERT / REPLACE / REMOVE PACEMAKER    . LOWER EXTREMITY ANGIOGRAPHY Left 06/07/2017   Procedure: LOWER EXTREMITY ANGIOGRAPHY;  Surgeon: Algernon Huxley, MD;  Location: [facility];  Service: Cardiovascular;  Laterality: Left;  . LOWER EXTREMITY INTERVENTION  06/07/2017   Procedure: LOWER EXTREMITY INTERVENTION;  Surgeon: Algernon Huxley, MD;  Location: [facility];  Service: Cardiovascular;;  . PERIPHERAL VASCULAR CATHETERIZATION Left 12/09/2015   Procedure: Lower Extremity Angiography;  Surgeon: Algernon Huxley, MD;  Location: [facility];  Service: Cardiovascular;  Laterality: Left;  . PERIPHERAL VASCULAR CATHETERIZATION  12/09/2015   Procedure: Lower Extremity Intervention;   Surgeon: Algernon Huxley, MD;  Location: [facility];  Service: Cardiovascular;;  . TONSILLECTOMY     Family History  Problem Relation Age of Onset  . Cancer Mother   . Cancer Father   . Heart disease Father    Allergies  Allergen Reactions  . Apixaban Rash  . Rivaroxaban Rash      Assessment & Plan:  Patient presents for his first post operative follow-up.  The patient is status post left common femoral, profunda femoris, and superficial femoral artery endarterectomies and patch angioplasty on August 11, 2017.  The patient's postoperative course has been unremarkable.  The patient denies any issues with his left groin  incision.  The patient is experiencing pain to the nailbed on the left first toe.  There is also a callus located to the lateral aspect which seems to cause him some discomfort as well.  The patient notes some soreness and swelling to the leg status post surgery however overall states he is doing well.  The patient denies any fever, nausea vomiting.  1. PVD (peripheral vascular disease) ([place]) - Stable Patient presents for his first postoperative follow-up Patient is status post a left femoral endarterectomy on August 11, 2017. The patient's postoperative course has been unremarkable with the exception of some swelling and soreness to the extremity. The patient is experiencing pain to the left first nail as well as discomfort to a callus along the lateral aspect of the left first toe.  I will refer the patient to a podiatrist for further recommendations. I will see the patient back in 1 month for an ABI and left lower extremity arterial duplex  - Ambulatory referral to Podiatry - VAS [place] ABI WITH/WO TBI; Future - VAS [place] LOWER EXTREMITY ARTERIAL DUPLEX; Future  2. Hx of AKA (above knee amputation), right (HCC) - Stable There is no issues with the patient's right stump at this time  Current Outpatient Medications on File Prior to Visit  Medication Sig Dispense Refill    . acetaminophen (TYLENOL) 500 MG tablet Take 1,000 mg by mouth daily as needed for moderate pain or headache.    . ALPRAZolam (XANAX) 1 MG tablet Take 1 mg by mouth 2 (two) times daily.     Marland Kitchen aspirin EC 81 MG tablet Take 1 tablet (81 mg total) by mouth daily. (Patient taking differently: Take 81 mg by mouth daily at 2 PM. (midday)) 150 tablet 2  . Cyanocobalamin (B-12) 1000 MCG/ML KIT Inject 1,000 mcg as directed every 30 (thirty) days.    Marland Kitchen docusate sodium (COLACE) 100 MG capsule Take 100 mg by mouth at bedtime as needed (for constipation.).     Marland Kitchen fluticasone (FLONASE) 50 MCG/ACT nasal spray Place 2 sprays into both nostrils daily.    . fluticasone-salmeterol (ADVAIR HFA) 115-21 MCG/ACT inhaler USE 2 INHALATIONS ORALLY   EVERY 12 HOURS 12 g 5  . furosemide (LASIX) 20 MG tablet Take 10 mg by mouth daily as needed (for fluid retention.).     Marland Kitchen guaiFENesin (MUCINEX) 600 MG 12 hr tablet Take 600 mg by mouth 2 (two) times daily.    Marland Kitchen ipratropium (ATROVENT) 0.03 % nasal spray Place 2 sprays into both nostrils 2 (two) times daily.     . Ipratropium-Albuterol (COMBIVENT RESPIMAT) 20-100 MCG/ACT AERS respimat Inhale 1 puff into the lungs every 4 (four) hours.     Marland Kitchen levalbuterol (XOPENEX) 1.25 MG/3ML nebulizer solution Take 1.25 mg (3 mLs total) by nebulization every 4 (four) hours. (Patient taking differently: Take 3 mLs by nebulization every 4 (four) hours as needed for wheezing or shortness of breath ((typically twice daily)). ) 72 mL 12  . loratadine (CLARITIN) 10 MG tablet Take 10 mg by mouth at bedtime.    . lovastatin (MEVACOR) 20 MG tablet Take 20 mg by mouth at bedtime.    . metoprolol succinate (TOPROL-XL) 25 MG 24 hr tablet Take 25 mg by mouth daily.    . nitroGLYCERIN (NITROSTAT) 0.4 MG SL tablet Place 0.4 mg under the tongue every 5 (five) minutes as needed for chest pain.    Marland Kitchen nystatin ointment (MYCOSTATIN) Apply 1 application topically daily as needed (rash). Applied to corners of  mouth  0   . oxyCODONE-acetaminophen (PERCOCET/ROXICET) 5-325 MG tablet One to Two Tabs By Mouth Every Six Hours As Needed For Pain 60 tablet 0  . pantoprazole (PROTONIX) 40 MG tablet Take 40 mg by mouth daily before breakfast.     . ramipril (ALTACE) 10 MG capsule Take 10 mg by mouth at bedtime.     . Tiotropium Bromide Monohydrate (SPIRIVA RESPIMAT) 1.25 MCG/ACT AERS Inhale 2 puffs into the lungs daily. 12 g 2  . vitamin B-12 (CYANOCOBALAMIN) 1000 MCG tablet Take 1,000 mcg by mouth daily.    Marland Kitchen warfarin (COUMADIN) 5 MG tablet Take 5 mg by mouth at bedtime.     Marland Kitchen azithromycin (ZITHROMAX) 250 MG tablet TAKE 2 TABLETS BY MOUTH TODAY, THEN TAKE 1 TABLET DAILY FOR 4 DAYS  0  . predniSONE (DELTASONE) 10 MG tablet 6 tabs x 1 day, 5 tabs x 1 day, 4 tabs x 1 day, etc...     No current facility-administered medications on file prior to visit.    There are no Patient Instructions on file for this visit. No follow-ups on file.  Dotti Busey A Shastina Rua, PA-C

## 2017-08-30 ENCOUNTER — Telehealth (INDEPENDENT_AMBULATORY_CARE_PROVIDER_SITE_OTHER): Payer: Self-pay

## 2017-08-30 NOTE — Telephone Encounter (Signed)
Patient called seeing if his podiatry referral had been sent and I check his chart and inform him that it had been sent [facility] ([place])

## 2017-09-06 DIAGNOSIS — Z0000 Encounter for general adult medical examination without abnormal findings: Secondary | ICD-10-CM | POA: Diagnosis not present

## 2017-09-06 DIAGNOSIS — I10 Essential (primary) hypertension: Secondary | ICD-10-CM | POA: Diagnosis not present

## 2017-09-06 DIAGNOSIS — Z89511 Acquired absence of right leg below knee: Secondary | ICD-10-CM | POA: Diagnosis not present

## 2017-09-06 DIAGNOSIS — I739 Peripheral vascular disease, unspecified: Secondary | ICD-10-CM | POA: Diagnosis not present

## 2017-09-06 DIAGNOSIS — L03032 Cellulitis of left toe: Secondary | ICD-10-CM | POA: Diagnosis not present

## 2017-09-06 DIAGNOSIS — I70219 Atherosclerosis of native arteries of extremities with intermittent claudication, unspecified extremity: Secondary | ICD-10-CM | POA: Diagnosis not present

## 2017-09-14 DIAGNOSIS — R21 Rash and other nonspecific skin eruption: Secondary | ICD-10-CM | POA: Diagnosis not present

## 2017-09-14 DIAGNOSIS — J431 Panlobular emphysema: Secondary | ICD-10-CM | POA: Diagnosis not present

## 2017-09-14 DIAGNOSIS — E782 Mixed hyperlipidemia: Secondary | ICD-10-CM | POA: Diagnosis not present

## 2017-09-14 DIAGNOSIS — I2510 Atherosclerotic heart disease of native coronary artery without angina pectoris: Secondary | ICD-10-CM | POA: Diagnosis not present

## 2017-09-14 DIAGNOSIS — Z125 Encounter for screening for malignant neoplasm of prostate: Secondary | ICD-10-CM | POA: Diagnosis not present

## 2017-09-14 DIAGNOSIS — I10 Essential (primary) hypertension: Secondary | ICD-10-CM | POA: Diagnosis not present

## 2017-09-14 DIAGNOSIS — I739 Peripheral vascular disease, unspecified: Secondary | ICD-10-CM | POA: Diagnosis not present

## 2017-09-14 DIAGNOSIS — I482 Chronic atrial fibrillation: Secondary | ICD-10-CM | POA: Diagnosis not present

## 2017-09-22 DIAGNOSIS — J441 Chronic obstructive pulmonary disease with (acute) exacerbation: Secondary | ICD-10-CM | POA: Diagnosis not present

## 2017-09-27 ENCOUNTER — Ambulatory Visit (INDEPENDENT_AMBULATORY_CARE_PROVIDER_SITE_OTHER): Payer: Medicare HMO | Admitting: Vascular Surgery

## 2017-09-27 ENCOUNTER — Ambulatory Visit (INDEPENDENT_AMBULATORY_CARE_PROVIDER_SITE_OTHER): Payer: Medicare HMO

## 2017-09-27 ENCOUNTER — Encounter (INDEPENDENT_AMBULATORY_CARE_PROVIDER_SITE_OTHER): Payer: Self-pay | Admitting: Vascular Surgery

## 2017-09-27 VITALS — BP 118/77 | HR 77 | Resp 15 | Ht 73.0 in | Wt 136.0 lb

## 2017-09-27 DIAGNOSIS — I739 Peripheral vascular disease, unspecified: Secondary | ICD-10-CM | POA: Diagnosis not present

## 2017-09-27 DIAGNOSIS — Z89611 Acquired absence of right leg above knee: Secondary | ICD-10-CM

## 2017-09-27 NOTE — Progress Notes (Signed)
Subjective:    Patient ID: Tyler [name], male    DOB: Jan 31, 1956, 62 y.o.   MRN: [id number] Chief Complaint  Patient presents with  . Follow-up    1 month ABI and Arterial   Patient presents for his second postoperative follow-up / first postoperative follow-up with studies.  The patient continues to be without complaint.  He denies any claudication-like symptoms, rest pain or ulceration formation to the left lower extremity.  The patient does not do much ambulation now that he has become progressively more weak in his upper extremity.  The patient is being followed by Dr. Vickki Muff who removed the toenail on his first left toe.  The patient states that this area is showing signs of healing.  The patient denies any issues with his left groin incision.  The patient underwent an ABI which was notable for noncompressible left lower extremity arteries.  With biphasic anterior tibial artery and posterior tibial artery.  The patient underwent a left lower extremity arterial duplex which was notable for biphasic blood flow distally.  When compared to the previous exam before the patient's recent surgery there has been improvement in arterial blood flow.  The patient denies any fever, nausea vomiting.  Review of Systems  Constitutional: Negative.   HENT: Negative.   Eyes: Negative.   Respiratory: Negative.   Cardiovascular: Negative.   Gastrointestinal: Negative.   Endocrine: Negative.   Genitourinary: Negative.   Musculoskeletal: Negative.   Skin: Negative.   Allergic/Immunologic: Negative.   Neurological: Negative.   Hematological: Negative.   Psychiatric/Behavioral: Negative.       Objective:   Physical Exam  Constitutional: He is oriented to person, place, and time. He appears well-developed and well-nourished. No distress.  HENT:  Head: Normocephalic and atraumatic.  Right Ear: External ear normal.  Left Ear: External ear normal.  Eyes: Pupils are equal, round, and reactive to  light. Conjunctivae and EOM are normal.  Neck: Normal range of motion.  Cardiovascular: Normal rate, regular rhythm, normal heart sounds and intact distal pulses.  Pulses:      Radial pulses are 2+ on the right side, and 2+ on the left side.  To palpate left pedal pulses however the foot is warm Right BKA  Pulmonary/Chest: Effort normal.  Musculoskeletal: Normal range of motion.  Neurological: He is alert and oriented to person, place, and time.  Skin: Skin is warm and dry. He is not diaphoretic.  Psychiatric: He has a normal mood and affect. His behavior is normal. Judgment and thought content normal.  Vitals reviewed.  BP 118/77 (BP Location: Left Arm, Patient Position: Sitting)   Pulse 77   Resp 15   Ht 6' 1" (1.854 m)   Wt 136 lb (61.7 kg)   BMI 17.94 kg/m   Past Medical History:  Diagnosis Date  . AICD (automatic cardioverter/defibrillator) present   . Arthritis   . Atrial fibrillation ([place])   . Cervical spinal stenosis    with neuropathy  . CHF (congestive heart failure) ([place])   . COPD (chronic obstructive pulmonary disease) ([place])   . Coronary artery disease   . Depression   . Dyspnea   . GERD (gastroesophageal reflux disease)   . Hypertension   . Myocardial infarction ([place])   . Peripheral vascular disease ([place])   . Presence of permanent cardiac pacemaker    Social History   Socioeconomic History  . Marital status: Married    Spouse name: Not on file  . Number of children:  Not on file  . Years of education: Not on file  . Highest education level: Not on file  Occupational History  . Not on file  Social Needs  . Financial resource strain: Not on file  . Food insecurity:    Worry: Not on file    Inability: Not on file  . Transportation needs:    Medical: Not on file    Non-medical: Not on file  Tobacco Use  . Smoking status: Former Smoker    Packs/day: 1.00    Years: 42.00    Pack years: 42.00    Types: Cigarettes    Last attempt to quit: 09/23/2013      Years since quitting: 4.0  . Smokeless tobacco: Never Used  Substance and Sexual Activity  . Alcohol use: No  . Drug use: No  . Sexual activity: Not on file  Lifestyle  . Physical activity:    Days per week: Not on file    Minutes per session: Not on file  . Stress: Not on file  Relationships  . Social connections:    Talks on phone: Not on file    Gets together: Not on file    Attends religious service: Not on file    Active member of club or organization: Not on file    Attends meetings of clubs or organizations: Not on file    Relationship status: Not on file  . Intimate partner violence:    Fear of current or ex partner: Not on file    Emotionally abused: Not on file    Physically abused: Not on file    Forced sexual activity: Not on file  Other Topics Concern  . Not on file  Social History Narrative  . Not on file   Past Surgical History:  Procedure Laterality Date  . ABOVE KNEE LEG AMPUTATION Right    after below the knee amputation   . BELOW KNEE LEG AMPUTATION Right   . CATARACT EXTRACTION, BILATERAL Bilateral   . ENDARTERECTOMY FEMORAL Left 08/11/2017   Procedure: ENDARTERECTOMY FEMORAL;  Surgeon: Algernon Huxley, MD;  Location: [facility];  Service: Vascular;  Laterality: Left;  . IMPLANTABLE CARDIOVERTER DEFIBRILLATOR (ICD) GENERATOR CHANGE Left 02/10/2017   Procedure: ICD GENERATOR CHANGE;  Surgeon: Isaias Cowman, MD;  Location: [facility];  Service: Cardiovascular;  Laterality: Left;  . INSERT / REPLACE / REMOVE PACEMAKER    . LOWER EXTREMITY ANGIOGRAPHY Left 06/07/2017   Procedure: LOWER EXTREMITY ANGIOGRAPHY;  Surgeon: Algernon Huxley, MD;  Location: [facility];  Service: Cardiovascular;  Laterality: Left;  . LOWER EXTREMITY INTERVENTION  06/07/2017   Procedure: LOWER EXTREMITY INTERVENTION;  Surgeon: Algernon Huxley, MD;  Location: [facility];  Service: Cardiovascular;;  . PERIPHERAL VASCULAR CATHETERIZATION Left 12/09/2015   Procedure: Lower  Extremity Angiography;  Surgeon: Algernon Huxley, MD;  Location: [facility];  Service: Cardiovascular;  Laterality: Left;  . PERIPHERAL VASCULAR CATHETERIZATION  12/09/2015   Procedure: Lower Extremity Intervention;  Surgeon: Algernon Huxley, MD;  Location: [facility];  Service: Cardiovascular;;  . TONSILLECTOMY     Family History  Problem Relation Age of Onset  . Cancer Mother   . Cancer Father   . Heart disease Father    Allergies  Allergen Reactions  . Apixaban Rash  . Rivaroxaban Rash      Assessment & Plan:  Patient presents for his second postoperative follow-up / first postoperative follow-up with studies.  The patient  continues to be without complaint.  He denies any claudication-like symptoms, rest pain or ulceration formation to the left lower extremity.  The patient does not do much ambulation now that he has become progressively more weak in his upper extremity.  The patient is being followed by Dr. Vickki Muff who removed the toenail on his first left toe.  The patient states that this area is showing signs of healing.  The patient denies any issues with his left groin incision.  The patient underwent an ABI which was notable for noncompressible left lower extremity arteries.  With biphasic anterior tibial artery and posterior tibial artery.  The patient underwent a left lower extremity arterial duplex which was notable for biphasic blood flow distally.  When compared to the previous exam before the patient's recent surgery there has been improvement in arterial blood flow.  The patient denies any fever, nausea vomiting.  1. PVD (peripheral vascular disease) ([place]) Patient presents for his second postoperative follow-up without complaint The patient is not very ambulatory due to his progressively weakening upper body strength however denies any claudication-like symptoms, rest pain or ulcer formation to the left lower extremity ABI and left lower extremity arterial duplex  with biphasic blood flow distally.  When compared to the previous this is an improvement in arterial flow. Recent removal of his left big toenail by podiatry.  The patient states that his nailbed is showing signs of healing. I will bring the patient back in 3 months for another ABI and left lower extremity arterial duplex for close monitoring as the patient has already lost his right below the knee due to peripheral artery disease  - VAS [place] ABI WITH/WO TBI; Future - VAS [place] LOWER EXTREMITY ARTERIAL DUPLEX; Future  2. Hx of AKA (above knee amputation), right ([facility]) - Stable The patient denies any issues with the stump  Current Outpatient Medications on File Prior to Visit  Medication Sig Dispense Refill  . acetaminophen (TYLENOL) 500 MG tablet Take 1,000 mg by mouth daily as needed for moderate pain or headache.    . ALPRAZolam (XANAX) 1 MG tablet Take 1 mg by mouth 2 (two) times daily.     Marland Kitchen aspirin EC 81 MG tablet Take 1 tablet (81 mg total) by mouth daily. (Patient taking differently: Take 81 mg by mouth daily at 2 PM. (midday)) 150 tablet 2  . Cyanocobalamin (B-12) 1000 MCG/ML KIT Inject 1,000 mcg as directed every 30 (thirty) days.    Marland Kitchen docusate sodium (COLACE) 100 MG capsule Take 100 mg by mouth at bedtime as needed (for constipation.).     Marland Kitchen fluticasone (FLONASE) 50 MCG/ACT nasal spray Place 2 sprays into both nostrils daily.    . fluticasone-salmeterol (ADVAIR HFA) 115-21 MCG/ACT inhaler USE 2 INHALATIONS ORALLY   EVERY 12 HOURS 12 g 5  . furosemide (LASIX) 20 MG tablet Take 10 mg by mouth daily as needed (for fluid retention.).     Marland Kitchen guaiFENesin (MUCINEX) 600 MG 12 hr tablet Take 600 mg by mouth 2 (two) times daily.    Marland Kitchen ipratropium (ATROVENT) 0.03 % nasal spray Place 2 sprays into both nostrils 2 (two) times daily.     . Ipratropium-Albuterol (COMBIVENT RESPIMAT) 20-100 MCG/ACT AERS respimat Inhale 1 puff into the lungs every 4 (four) hours.     Marland Kitchen levalbuterol (XOPENEX) 1.25 MG/3ML  nebulizer solution Take 1.25 mg (3 mLs total) by nebulization every 4 (four) hours. (Patient taking differently: Take 3 mLs by nebulization every 4 (four) hours as  needed for wheezing or shortness of breath ((typically twice daily)). ) 72 mL 12  . loratadine (CLARITIN) 10 MG tablet Take 10 mg by mouth at bedtime.    . lovastatin (MEVACOR) 20 MG tablet Take 20 mg by mouth at bedtime.    . metoprolol succinate (TOPROL-XL) 25 MG 24 hr tablet Take 25 mg by mouth daily.    . nitroGLYCERIN (NITROSTAT) 0.4 MG SL tablet Place 0.4 mg under the tongue every 5 (five) minutes as needed for chest pain.    Marland Kitchen nystatin ointment (MYCOSTATIN) Apply 1 application topically daily as needed (rash). Applied to corners of mouth  0  . oxyCODONE-acetaminophen (PERCOCET/ROXICET) 5-325 MG tablet One to Two Tabs By Mouth Every Six Hours As Needed For Pain 60 tablet 0  . pantoprazole (PROTONIX) 40 MG tablet Take 40 mg by mouth daily before breakfast.     . predniSONE (DELTASONE) 10 MG tablet 6 tabs x 1 day, 5 tabs x 1 day, 4 tabs x 1 day, etc...    . ramipril (ALTACE) 10 MG capsule Take 10 mg by mouth at bedtime.     . tamsulosin (FLOMAX) 0.4 MG CAPS capsule Take by mouth.    . Tiotropium Bromide Monohydrate (SPIRIVA RESPIMAT) 1.25 MCG/ACT AERS Inhale 2 puffs into the lungs daily. 12 g 2  . vitamin B-12 (CYANOCOBALAMIN) 1000 MCG tablet Take 1,000 mcg by mouth daily.    Marland Kitchen warfarin (COUMADIN) 5 MG tablet Take 5 mg by mouth at bedtime.     Marland Kitchen azithromycin (ZITHROMAX) 250 MG tablet TAKE 2 TABLETS BY MOUTH TODAY, THEN TAKE 1 TABLET DAILY FOR 4 DAYS  0   No current facility-administered medications on file prior to visit.    There are no Patient Instructions on file for this visit. No follow-ups on file.  KIMBERLY A STEGMAYER, PA-C

## 2017-09-29 DIAGNOSIS — Z89511 Acquired absence of right leg below knee: Secondary | ICD-10-CM | POA: Diagnosis not present

## 2017-09-29 DIAGNOSIS — L03032 Cellulitis of left toe: Secondary | ICD-10-CM | POA: Diagnosis not present

## 2017-09-29 DIAGNOSIS — I739 Peripheral vascular disease, unspecified: Secondary | ICD-10-CM | POA: Diagnosis not present

## 2017-10-14 DIAGNOSIS — I482 Chronic atrial fibrillation: Secondary | ICD-10-CM | POA: Diagnosis not present

## 2017-10-21 DIAGNOSIS — M5000 Cervical disc disorder with myelopathy, unspecified cervical region: Secondary | ICD-10-CM | POA: Diagnosis not present

## 2017-10-21 DIAGNOSIS — L02419 Cutaneous abscess of limb, unspecified: Secondary | ICD-10-CM | POA: Diagnosis not present

## 2017-10-22 DIAGNOSIS — L723 Sebaceous cyst: Secondary | ICD-10-CM | POA: Diagnosis not present

## 2017-10-22 DIAGNOSIS — L089 Local infection of the skin and subcutaneous tissue, unspecified: Secondary | ICD-10-CM | POA: Diagnosis not present

## 2017-10-23 DIAGNOSIS — J441 Chronic obstructive pulmonary disease with (acute) exacerbation: Secondary | ICD-10-CM | POA: Diagnosis not present

## 2017-10-28 ENCOUNTER — Telehealth (INDEPENDENT_AMBULATORY_CARE_PROVIDER_SITE_OTHER): Payer: Self-pay | Admitting: Vascular Surgery

## 2017-10-28 ENCOUNTER — Telehealth (INDEPENDENT_AMBULATORY_CARE_PROVIDER_SITE_OTHER): Payer: Self-pay

## 2017-10-28 ENCOUNTER — Other Ambulatory Visit (INDEPENDENT_AMBULATORY_CARE_PROVIDER_SITE_OTHER): Payer: Self-pay | Admitting: Vascular Surgery

## 2017-10-28 DIAGNOSIS — R1907 Generalized intra-abdominal and pelvic swelling, mass and lump: Secondary | ICD-10-CM

## 2017-10-28 NOTE — Telephone Encounter (Signed)
Waiting for a response.

## 2017-10-28 NOTE — Telephone Encounter (Signed)
I spoke with the patient and inform him with what Tyler [name] advise

## 2017-10-28 NOTE — Telephone Encounter (Signed)
Please tell the patient that I will be ordering a CT scan of his abdomen / pelvis so we can figure out what it might be. He should be expecting a phone call from the radiology department directly to schedule this. Once he has completed the CT he should contact the office to review the images with Dr. Lucky Cowboy

## 2017-11-02 DIAGNOSIS — I495 Sick sinus syndrome: Secondary | ICD-10-CM | POA: Diagnosis not present

## 2017-11-05 ENCOUNTER — Ambulatory Visit
Admission: RE | Admit: 2017-11-05 | Discharge: 2017-11-05 | Disposition: A | Discharge status: Home / Self Care | Payer: Medicare HMO | Source: Ambulatory Visit | Attending: Vascular Surgery | Admitting: Vascular Surgery

## 2017-11-05 DIAGNOSIS — R1907 Generalized intra-abdominal and pelvic swelling, mass and lump: Secondary | ICD-10-CM | POA: Diagnosis not present

## 2017-11-05 DIAGNOSIS — I708 Atherosclerosis of other arteries: Secondary | ICD-10-CM | POA: Diagnosis not present

## 2017-11-05 DIAGNOSIS — K5730 Diverticulosis of large intestine without perforation or abscess without bleeding: Secondary | ICD-10-CM | POA: Diagnosis not present

## 2017-11-05 DIAGNOSIS — I898 Other specified noninfective disorders of lymphatic vessels and lymph nodes: Secondary | ICD-10-CM | POA: Diagnosis not present

## 2017-11-05 DIAGNOSIS — I745 Embolism and thrombosis of iliac artery: Secondary | ICD-10-CM | POA: Insufficient documentation

## 2017-11-05 LAB — POCT I-STAT CREATININE: Creatinine, Ser: 0.8 mg/dL (ref 0.61–1.24)

## 2017-11-05 MED ORDER — IOPAMIDOL (ISOVUE-370) INJECTION 76%
100.0000 mL | Freq: Once | INTRAVENOUS | Status: AC | PRN
  Administered 2017-11-05: 100 mL via INTRAVENOUS

## 2017-11-09 ENCOUNTER — Encounter: Payer: Self-pay | Admitting: Vascular Surgery

## 2017-11-16 ENCOUNTER — Ambulatory Visit (INDEPENDENT_AMBULATORY_CARE_PROVIDER_SITE_OTHER): Payer: Medicare HMO | Admitting: Vascular Surgery

## 2017-11-16 ENCOUNTER — Encounter (INDEPENDENT_AMBULATORY_CARE_PROVIDER_SITE_OTHER): Payer: Self-pay | Admitting: Vascular Surgery

## 2017-11-16 VITALS — BP 130/94 | HR 88 | Resp 17 | Ht 71.0 in | Wt 137.0 lb

## 2017-11-16 DIAGNOSIS — J449 Chronic obstructive pulmonary disease, unspecified: Secondary | ICD-10-CM | POA: Diagnosis not present

## 2017-11-16 DIAGNOSIS — Z89611 Acquired absence of right leg above knee: Secondary | ICD-10-CM

## 2017-11-16 DIAGNOSIS — I739 Peripheral vascular disease, unspecified: Secondary | ICD-10-CM

## 2017-11-16 NOTE — Assessment & Plan Note (Signed)
remote 

## 2017-11-16 NOTE — Progress Notes (Signed)
MRN : [id number]  Tyler [name] is a 62 y.o. (19-Dec-1955) male who presents with chief complaint of  Chief Complaint  Patient presents with  . Follow-up    ct results  .  History of Present Illness: Patient returns today in follow up after his CT scan.  He is over 3 months status post left femoral endarterectomy.  He has a lump that is fluid-filled in his left groin.  This is not red.  This is not painful.  He has no fever or chills.  I have independently reviewed his CT scan and this appears to be a simple seroma overlying the femoral vessels in the left inguinal region.  Current Outpatient Medications  Medication Sig Dispense Refill  . acetaminophen (TYLENOL) 500 MG tablet Take 1,000 mg by mouth daily as needed for moderate pain or headache.    . ALPRAZolam (XANAX) 1 MG tablet Take 1 mg by mouth 2 (two) times daily.     Marland Kitchen aspirin EC 81 MG tablet Take 1 tablet (81 mg total) by mouth daily. 150 tablet 2  . Cyanocobalamin (B-12) 1000 MCG/ML KIT Inject 1,000 mcg as directed every 30 (thirty) days.    Marland Kitchen docusate sodium (COLACE) 100 MG capsule Take 100 mg by mouth at bedtime as needed (for constipation.).     Marland Kitchen fluticasone (FLONASE) 50 MCG/ACT nasal spray Place 2 sprays into both nostrils daily.    . fluticasone-salmeterol (ADVAIR HFA) 115-21 MCG/ACT inhaler USE 2 INHALATIONS ORALLY   EVERY 12 HOURS 12 g 5  . furosemide (LASIX) 20 MG tablet Take 10 mg by mouth daily as needed (for fluid retention.).     Marland Kitchen guaiFENesin (MUCINEX) 600 MG 12 hr tablet Take 600 mg by mouth 2 (two) times daily.    Marland Kitchen ipratropium (ATROVENT) 0.03 % nasal spray Place 2 sprays into both nostrils 2 (two) times daily.     . Ipratropium-Albuterol (COMBIVENT RESPIMAT) 20-100 MCG/ACT AERS respimat Inhale 1 puff into the lungs every 4 (four) hours.     Marland Kitchen levalbuterol (XOPENEX) 1.25 MG/3ML nebulizer solution Take 1.25 mg (3 mLs total) by nebulization every 4 (four) hours. (Patient taking differently: Take 3 mLs by  nebulization every 4 (four) hours as needed for wheezing or shortness of breath ((typically twice daily)). ) 72 mL 12  . loratadine (CLARITIN) 10 MG tablet Take 10 mg by mouth at bedtime.    . lovastatin (MEVACOR) 20 MG tablet Take 20 mg by mouth at bedtime.    . metoprolol succinate (TOPROL-XL) 25 MG 24 hr tablet Take 25 mg by mouth daily.    . nitroGLYCERIN (NITROSTAT) 0.4 MG SL tablet Place 0.4 mg under the tongue every 5 (five) minutes as needed for chest pain.    Marland Kitchen nystatin ointment (MYCOSTATIN) Apply 1 application topically daily as needed (rash). Applied to corners of mouth  0  . oxyCODONE-acetaminophen (PERCOCET/ROXICET) 5-325 MG tablet One to Two Tabs By Mouth Every Six Hours As Needed For Pain 60 tablet 0  . pantoprazole (PROTONIX) 40 MG tablet Take 40 mg by mouth daily before breakfast.     . predniSONE (DELTASONE) 10 MG tablet 6 tabs x 1 day, 5 tabs x 1 day, 4 tabs x 1 day, etc...    . ramipril (ALTACE) 10 MG capsule Take 10 mg by mouth at bedtime.     . tamsulosin (FLOMAX) 0.4 MG CAPS capsule Take by mouth.    . Tiotropium Bromide Monohydrate (SPIRIVA RESPIMAT) 1.25 MCG/ACT AERS Inhale 2 puffs  into the lungs daily. 12 g 2  . vitamin B-12 (CYANOCOBALAMIN) 1000 MCG tablet Take 1,000 mcg by mouth daily.    Marland Kitchen warfarin (COUMADIN) 5 MG tablet Take 5 mg by mouth at bedtime.     Marland Kitchen azithromycin (ZITHROMAX) 250 MG tablet TAKE 2 TABLETS BY MOUTH TODAY, THEN TAKE 1 TABLET DAILY FOR 4 DAYS  0   No current facility-administered medications for this visit.     Past Medical History:  Diagnosis Date  . AICD (automatic cardioverter/defibrillator) present   . Arthritis   . Atrial fibrillation ([place])   . Cervical spinal stenosis    with neuropathy  . CHF (congestive heart failure) ([place])   . COPD (chronic obstructive pulmonary disease) ([place])   . Coronary artery disease   . Depression   . Dyspnea   . GERD (gastroesophageal reflux disease)   . Hypertension   . Myocardial infarction ([place])   .  Peripheral vascular disease ([place])   . Presence of permanent cardiac pacemaker     Past Surgical History:  Procedure Laterality Date  . ABOVE KNEE LEG AMPUTATION Right    after below the knee amputation   . BELOW KNEE LEG AMPUTATION Right   . CATARACT EXTRACTION, BILATERAL Bilateral   . ENDARTERECTOMY FEMORAL Left 08/11/2017   Procedure: ENDARTERECTOMY FEMORAL;  Surgeon: Algernon Huxley, MD;  Location: [facility];  Service: Vascular;  Laterality: Left;  . IMPLANTABLE CARDIOVERTER DEFIBRILLATOR (ICD) GENERATOR CHANGE Left 02/10/2017   Procedure: ICD GENERATOR CHANGE;  Surgeon: Isaias Cowman, MD;  Location: [facility];  Service: Cardiovascular;  Laterality: Left;  . INSERT / REPLACE / REMOVE PACEMAKER    . LOWER EXTREMITY ANGIOGRAPHY Left 06/07/2017   Procedure: LOWER EXTREMITY ANGIOGRAPHY;  Surgeon: Algernon Huxley, MD;  Location: [place] [facility];  Service: Cardiovascular;  Laterality: Left;  . LOWER EXTREMITY INTERVENTION  06/07/2017   Procedure: LOWER EXTREMITY INTERVENTION;  Surgeon: Algernon Huxley, MD;  Location: [place] [facility];  Service: Cardiovascular;;  . PERIPHERAL VASCULAR CATHETERIZATION Left 12/09/2015   Procedure: Lower Extremity Angiography;  Surgeon: Algernon Huxley, MD;  Location: [place] [facility];  Service: Cardiovascular;  Laterality: Left;  . PERIPHERAL VASCULAR CATHETERIZATION  12/09/2015   Procedure: Lower Extremity Intervention;  Surgeon: Algernon Huxley, MD;  Location: [place] [facility];  Service: Cardiovascular;;  . TONSILLECTOMY      Social History Social History   Tobacco Use  . Smoking status: Former Smoker    Packs/day: 1.00    Years: 42.00    Pack years: 42.00    Types: Cigarettes    Last attempt to quit: 09/23/2013    Years since quitting: 4.1  . Smokeless tobacco: Never Used  Substance Use Topics  . Alcohol use: No  . Drug use: No    Family History Family History  Problem Relation Age of Onset  . Cancer Mother   . Cancer Father   . Heart  disease Father      Allergies  Allergen Reactions  . Apixaban Rash  . Rivaroxaban Rash     REVIEW OF SYSTEMS (Negative unless checked)  Constitutional: '[]' Weight loss  '[]' Fever  '[]' Chills Cardiac: '[]' Chest pain   '[]' Chest pressure   '[x]' Palpitations   '[]' Shortness of breath when laying flat   '[]' Shortness of breath at rest   '[x]' Shortness of breath with exertion. Vascular:  '[]' Pain in legs with walking   '[]' Pain in legs at rest   '[]' Pain in legs when laying flat   '[]' Claudication   '[]'   Pain in feet when walking  '[]' Pain in feet at rest  '[]' Pain in feet when laying flat   '[]' History of DVT   '[]' Phlebitis   '[x]' Swelling in legs   '[]' Varicose veins   '[]' Non-healing ulcers Pulmonary:   '[]' Uses home oxygen   '[]' Productive cough   '[]' Hemoptysis   '[]' Wheeze  '[x]' COPD   '[]' Asthma Neurologic:  '[]' Dizziness  '[]' Blackouts   '[]' Seizures   '[]' History of stroke   '[]' History of TIA  '[]' Aphasia   '[]' Temporary blindness   '[]' Dysphagia   '[]' Weakness or numbness in arms   '[]' Weakness or numbness in legs Musculoskeletal:  '[x]' Arthritis   '[]' Joint swelling   '[x]' Joint pain   '[]' Low back pain Hematologic:  '[]' Easy bruising  '[]' Easy bleeding   '[]' Hypercoagulable state   '[]' Anemic   Gastrointestinal:  '[]' Blood in stool   '[]' Vomiting blood  '[]' Gastroesophageal reflux/heartburn   '[]' Abdominal pain Genitourinary:  '[]' Chronic kidney disease   '[]' Difficult urination  '[]' Frequent urination  '[]' Burning with urination   '[]' Hematuria Skin:  '[]' Rashes   '[]' Ulcers   '[]' Wounds Psychological:  '[]' History of anxiety   '[]'  History of major depression.  Physical Examination  BP (!) 130/94 (BP Location: Left Arm)   Pulse 88   Resp 17   Ht '5\' 11"'  (1.803 m)   Wt 137 lb (62.1 kg)   BMI 19.11 kg/m  Gen: Thin and somewhat frail appearing, appears older than stated age Head: [place]/AT Ear/Nose/Throat: Hearing grossly intact, nares w/o erythema or drainage Eyes: Conjunctiva clear. Sclera non-icteric Neck: Supple.  Trachea midline Pulmonary:  Good air movement, no use of accessory muscles  on supplemental oxygen.  Cardiac: irregularly irregular Vascular:  Vessel Right Left  Radial Palpable Palpable                                    Musculoskeletal: In a wheelchair. Right AKA.  Fluid-filled bulge does have easy palpability in the left inguinal region.  The incision is clean, dry, and intact.  No erythema or drainage Neurologic: Sensation grossly intact in extremities.  Symmetrical.  Speech is fluent.  Psychiatric: Judgment intact, Mood & affect appropriate for pt's clinical situation. Dermatologic: No rashes or ulcers noted.  No cellulitis or open wounds.       Labs Recent Results (from the past 2160 hour(s))  I-STAT creatinine     Status: None   Collection Time: 11/05/17  3:45 PM  Result Value Ref Range   Creatinine, Ser 0.80 0.61 - 1.24 mg/dL    Radiology Ct Angio Abd/pel W/ And/or W/o  Result Date: 11/06/2017 CLINICAL DATA:  Previous left femoral endarterectomy and stenting. Left pelvic swelling. EXAM: CTA ABDOMEN AND PELVIS wITHOUT AND WITH CONTRAST TECHNIQUE: Multidetector CT imaging of the abdomen and pelvis was performed using the standard protocol during bolus administration of intravenous contrast. Multiplanar reconstructed images and MIPs were obtained and reviewed to evaluate the vascular anatomy. CONTRAST:  18m ISOVUE-370 IOPAMIDOL (ISOVUE-370) INJECTION 76% COMPARISON:  09/25/2013 FINDINGS: VASCULAR Aorta: Heavy calcified atheromatous plaque particularly in the infrarenal segment. No aneurysm, dissection, or stenosis. Celiac: Patent without evidence of aneurysm, dissection, vasculitis or significant stenosis. SMA: Patent without evidence of aneurysm, dissection, vasculitis or significant stenosis. Scattered calcified nonocclusive plaque. Renals: Single left, widely patent. Duplicated on the right, Co dominant, both patent. IMA: Occluded Right lower extremity: Inflow: Eccentric calcified plaque resulting in at least mild stenosis in the distal common  iliac artery. The internal iliac is patent. Proximal occlusion  of the external iliac extending through its length. Proximal Outflow: Occlusion extends through the common femoral artery and visualized proximal SFA. There is collateral reconstitution of deep femoral branches. Left lower extremity: Inflow: Heavily calcified eccentric plaque through the common iliac artery resulting in at least mild stenosis. Internal iliac is patent. There is coarse calcified eccentric plaque scattered through the length of the external iliac artery resulting in tandem areas of mild stenosis. Proximal outflow: Widely patent common femoral artery. Deep femoral branches patent. Proximal SFA patent, with mild external mass effect upon the proximal aspect by overlying lymphocele. Veins: Patent portal vein, bilateral renal veins, IVC, iliac venous system. Review of the MIP images confirms the above findings. NON-VASCULAR Lower chest: Mild emphysematous change in the visualized lung bases. Transvenous pacing leads partially visualized. No pleural or pericardial effusion. Hepatobiliary: Subcentimeter calcified granuloma in hepatic segment 8. No other focal liver abnormality is seen. No gallstones, gallbladder wall thickening, or biliary dilatation. Pancreas: Unremarkable. No pancreatic ductal dilatation or surrounding inflammatory changes. Spleen: Normal in size.  Calcified granulomas. Adrenals/Urinary Tract: Adrenal glands are unremarkable. Kidneys are normal, without renal calculi, focal lesion, or hydronephrosis. Bladder is unremarkable. Stomach/Bowel: Stomach is incompletely distended. Small bowel decompressed. Appendix not discretely identified. The colon is nondilated, with a few scattered distal descending and sigmoid diverticula; no adjacent inflammatory/edematous change or abscess. Lymphatic: No abdominal or pelvic adenopathy. 4.8 cm fluid attenuation collection presumably lymphocele overlying the left common femoral artery  bifurcation and proximal SFA. Reproductive: Prostatic enlargement. Other: No ascites.  No free air. Musculoskeletal: Sclerosis in the left femoral head suggesting AVM, without subchondral collapse. Negative for fracture. IMPRESSION: VASCULAR 1. Bilateral common iliac artery calcified plaque resulting in at least mild stenosis. 2. 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 3. Widely patent visualized proximal left lower extremity arterial outflow. NON-VASCULAR 1. 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 2. Descending and sigmoid diverticulosis. Electronically Signed   By: Lucrezia Europe M.D.   On: 11/06/2017 09:50    Assessment/Plan  Hx of AKA (above knee amputation), right (HCC) remote  COPD (chronic obstructive pulmonary disease) (HCC) On oxygen  PVD (peripheral vascular disease) ([place])  I have independently reviewed his CT scan and this appears to be a simple seroma overlying the femoral vessels in the left inguinal region. His perfusion is intact. The seroma is not overly symptomatic, and at this point we discussed whether or not drainage would be of use.  He does not really want to have it drained and I do not blame him.  He has scheduled appointment in a few months and if this becomes more symptomatic or shows signs of infection, drainage should be considered.    Leotis Pain, MD  11/16/2017 3:21 PM    This note was created with Dragon medical transcription system.  Any errors from dictation are purely unintentional

## 2017-11-16 NOTE — Assessment & Plan Note (Signed)
On oxygen 

## 2017-11-16 NOTE — Assessment & Plan Note (Signed)
I have independently reviewed his CT scan and this appears to be a simple seroma overlying the femoral vessels in the left inguinal region. His perfusion is intact. The seroma is not overly symptomatic, and at this point we discussed whether or not drainage would be of use.  He does not really want to have it drained and I do not blame him.  He has scheduled appointment in a few months and if this becomes more symptomatic or shows signs of infection, drainage should be considered.

## 2017-11-17 DIAGNOSIS — I482 Chronic atrial fibrillation: Secondary | ICD-10-CM | POA: Diagnosis not present

## 2017-11-22 DIAGNOSIS — J441 Chronic obstructive pulmonary disease with (acute) exacerbation: Secondary | ICD-10-CM | POA: Diagnosis not present

## 2017-12-04 ENCOUNTER — Other Ambulatory Visit: Payer: Self-pay | Admitting: Internal Medicine

## 2017-12-06 DIAGNOSIS — I70219 Atherosclerosis of native arteries of extremities with intermittent claudication, unspecified extremity: Secondary | ICD-10-CM | POA: Diagnosis not present

## 2017-12-06 DIAGNOSIS — Z125 Encounter for screening for malignant neoplasm of prostate: Secondary | ICD-10-CM | POA: Diagnosis not present

## 2017-12-06 DIAGNOSIS — E538 Deficiency of other specified B group vitamins: Secondary | ICD-10-CM | POA: Diagnosis not present

## 2017-12-06 DIAGNOSIS — E782 Mixed hyperlipidemia: Secondary | ICD-10-CM | POA: Diagnosis not present

## 2017-12-06 DIAGNOSIS — I482 Chronic atrial fibrillation: Secondary | ICD-10-CM | POA: Diagnosis not present

## 2017-12-06 DIAGNOSIS — I5022 Chronic systolic (congestive) heart failure: Secondary | ICD-10-CM | POA: Diagnosis not present

## 2017-12-06 DIAGNOSIS — I2510 Atherosclerotic heart disease of native coronary artery without angina pectoris: Secondary | ICD-10-CM | POA: Diagnosis not present

## 2017-12-06 DIAGNOSIS — I10 Essential (primary) hypertension: Secondary | ICD-10-CM | POA: Diagnosis not present

## 2017-12-06 DIAGNOSIS — I340 Nonrheumatic mitral (valve) insufficiency: Secondary | ICD-10-CM | POA: Diagnosis not present

## 2017-12-06 DIAGNOSIS — I071 Rheumatic tricuspid insufficiency: Secondary | ICD-10-CM | POA: Diagnosis not present

## 2017-12-06 DIAGNOSIS — Z95810 Presence of automatic (implantable) cardiac defibrillator: Secondary | ICD-10-CM | POA: Diagnosis not present

## 2017-12-14 DIAGNOSIS — I482 Chronic atrial fibrillation: Secondary | ICD-10-CM | POA: Diagnosis not present

## 2017-12-14 DIAGNOSIS — F320 Major depressive disorder, single episode, mild: Secondary | ICD-10-CM | POA: Diagnosis not present

## 2017-12-14 DIAGNOSIS — M5000 Cervical disc disorder with myelopathy, unspecified cervical region: Secondary | ICD-10-CM | POA: Diagnosis not present

## 2017-12-14 DIAGNOSIS — E782 Mixed hyperlipidemia: Secondary | ICD-10-CM | POA: Diagnosis not present

## 2017-12-14 DIAGNOSIS — I70219 Atherosclerosis of native arteries of extremities with intermittent claudication, unspecified extremity: Secondary | ICD-10-CM | POA: Diagnosis not present

## 2017-12-14 DIAGNOSIS — I2510 Atherosclerotic heart disease of native coronary artery without angina pectoris: Secondary | ICD-10-CM | POA: Diagnosis not present

## 2017-12-14 DIAGNOSIS — I10 Essential (primary) hypertension: Secondary | ICD-10-CM | POA: Diagnosis not present

## 2017-12-14 DIAGNOSIS — J431 Panlobular emphysema: Secondary | ICD-10-CM | POA: Diagnosis not present

## 2017-12-20 DIAGNOSIS — I482 Chronic atrial fibrillation: Secondary | ICD-10-CM | POA: Diagnosis not present

## 2017-12-23 DIAGNOSIS — J441 Chronic obstructive pulmonary disease with (acute) exacerbation: Secondary | ICD-10-CM | POA: Diagnosis not present

## 2017-12-31 ENCOUNTER — Ambulatory Visit (INDEPENDENT_AMBULATORY_CARE_PROVIDER_SITE_OTHER): Payer: Medicare HMO

## 2017-12-31 ENCOUNTER — Ambulatory Visit (INDEPENDENT_AMBULATORY_CARE_PROVIDER_SITE_OTHER): Payer: Medicare HMO | Admitting: Nurse Practitioner

## 2017-12-31 ENCOUNTER — Encounter (INDEPENDENT_AMBULATORY_CARE_PROVIDER_SITE_OTHER): Payer: Self-pay | Admitting: Nurse Practitioner

## 2017-12-31 VITALS — BP 130/84 | HR 80 | Resp 16 | Ht 71.0 in | Wt 137.0 lb

## 2017-12-31 DIAGNOSIS — I10 Essential (primary) hypertension: Secondary | ICD-10-CM

## 2017-12-31 DIAGNOSIS — I70219 Atherosclerosis of native arteries of extremities with intermittent claudication, unspecified extremity: Secondary | ICD-10-CM | POA: Diagnosis not present

## 2017-12-31 DIAGNOSIS — I739 Peripheral vascular disease, unspecified: Secondary | ICD-10-CM

## 2017-12-31 DIAGNOSIS — J441 Chronic obstructive pulmonary disease with (acute) exacerbation: Secondary | ICD-10-CM

## 2018-01-01 NOTE — Progress Notes (Addendum)
Subjective:    Patient ID: Tyler [name], male    DOB: 03/24/1956, 62 y.o.   MRN: [id number] Chief Complaint  Patient presents with  . Follow-up    12monthleft le arterial    HPI   Mr. KHerzbergreturns to the office for followup and review of the noninvasive studies.  He is accompanied by his wife.  There have been no interval changes in lower extremity symptoms. No interval shortening of the patient's claudication distance or development of rest pain symptoms. No new ulcers or wounds have occurred since the last visit.  The patient has a right above-the-knee amputation  There have been no significant changes to the patient's overall health care.  However, the previously acknowledged seroma that did not bother Tyler [name] now causing him some discomfort and he would like to have it drained.  The patient denies amaurosis fugax or recent TIA symptoms. There are no recent neurological changes noted. The patient denies history of DVT, PE or superficial thrombophlebitis. The patient denies recent episodes of angina or shortness of breath.   ABI Lt= the ABIs are noncompressible with a TBI of 0.54, which is consistent with previous results on 09/27/2017. Duplex ultrasound of the left lower extremity reveals there is a 4.9 cm seRoma in the groin which is placing pressure on the common femoral artery possibly causing some increased velocities as well as decreased diameters.  However the vein vessels are widely patent with some moderate atherosclerosis.  Distal vessels are noncompressible, consistent with previous studies.  Constitutional: []Weight loss  []Fever  []Chills Cardiac: []Chest pain   []Chest pressure   []Palpitations   []Shortness of breath when laying flat   []Shortness of breath with exertion. Vascular:  [x]Pain in legs with walking   []Pain in legs with standing  []History of DVT   []Phlebitis   []Swelling in legs   []Varicose veins   []Non-healing ulcers Pulmonary:   [x]Uses home  oxygen   []Productive cough   []Hemoptysis   []Wheeze  [x]COPD   []Asthma Neurologic:  []Dizziness   []Seizures   []History of stroke   []History of TIA  []Aphasia   []Vissual changes   []Weakness or numbness in arm   [x]Weakness or numbness in leg Musculoskeletal:   []Joint swelling   []Joint pain   []Low back pain Hematologic:  []Easy bruising  []Easy bleeding   [x]Hypercoagulable state   []Anemic Gastrointestinal:  []Diarrhea   []Vomiting  []Gastroesophageal reflux/heartburn   []Difficulty swallowing. Genitourinary:  []Chronic kidney disease   []Difficult urination  []Frequent urination   []Blood in urine Skin:  []Rashes   []Ulcers  Psychological:  []History of anxiety   [] History of major depression.     Objective:   Physical Exam  BP 130/84 (BP Location: Left Arm)   Pulse 80   Resp 16   Ht 5' 11" (1.803 m)   Wt 137 lb (62.1 kg)   BMI 19.11 kg/m   Past Medical History:  Diagnosis Date  . AICD (automatic cardioverter/defibrillator) present   . Arthritis   . [place]rial fibrillation ([place]   . Cervical spinal stenosis    with neuropathy  . CHF (congestive heart failure) ([place]   . COPD (chronic obstructive pulmonary disease) ([facility]   . Coronary artery disease   . Depression   . Dyspnea   . GERD (gastroesophageal reflux disease)   . Hypertension   . Myocardial infarction ([place]   . Peripheral vascular disease ([place]   .  Presence of permanent cardiac pacemaker      Gen: WD/WN, NAD Head: Tyler [name]/AT, No temporalis wasting.  Ear/Nose/Throat: Hearing grossly intact, nares w/o erythema or drainage, poor dentition Eyes: PER, EOMI, sclera nonicteric.  Neck: Supple, no masses.  No bruit or JVD.  Pulmonary:  Good air movement, clear to auscultation bilaterally, no use of accessory muscles.  Cardiac: irregularly irregular Vascular:  Vessel Right Left  Radial  2+  2+  DP   trace palpable  Gastrointestinal: soft, non-distended. No guarding/no peritoneal signs.  Musculoskeletal: right AKA.  Seroma has become bothersome.  No drainage from seroma Neurologic: CN 2-12 intact. Pain and light touch intact in extremities.  Symmetrical.  Speech is fluent. Motor exam as listed above. Psychiatric: Judgment intact, Mood & affect appropriate for pt's clinical situation. Dermatologic: no Venous rashes no ulcers noted.  No changes consistent with cellulitis. Lymph : No Cervical lymphadenopathy, no lichenification or skin changes of chronic lymphedema.   Social History   Socioeconomic History  . Marital status: Married    Spouse name: Not on file  . Number of children: Not on file  . Years of education: Not on file  . Highest education level: Not on file  Occupational History  . Not on file  Social Needs  . Financial resource strain: Not on file  . Food insecurity:    Worry: Not on file    Inability: Not on file  . Transportation needs:    Medical: Not on file    Non-medical: Not on file  Tobacco Use  . Smoking status: Former Smoker    Packs/day: 1.00    Years: 42.00    Pack years: 42.00    Types: Cigarettes    Last attempt to quit: 09/23/2013    Years since quitting: 4.2  . Smokeless tobacco: Never Used  Substance and Sexual Activity  . Alcohol use: No  . Drug use: No  . Sexual activity: Not on file  Lifestyle  . Physical activity:    Days per week: Not on file    Minutes per session: Not on file  . Stress: Not on file  Relationships  . Social connections:    Talks on phone: Not on file    Gets together: Not on file    Attends religious service: Not on file    Active member of club or organization: Not on file    Attends meetings of clubs or organizations: Not on file    Relationship status: Not on file  . Intimate partner violence:    Fear of current or ex partner: Not on file    Emotionally abused: Not on file    Physically abused: Not on file    Forced sexual activity: Not on file  Other Topics Concern  . Not on file  Social History Narrative  . Not on file     Past Surgical History:  Procedure Laterality Date  . ABOVE KNEE LEG AMPUTATION Right    after below the knee amputation   . BELOW KNEE LEG AMPUTATION Right   . CATARACT EXTRACTION, BILATERAL Bilateral   . ENDARTERECTOMY FEMORAL Left 08/11/2017   Procedure: ENDARTERECTOMY FEMORAL;  Surgeon: Algernon Huxley, MD;  Location: [facility];  Service: Vascular;  Laterality: Left;  . IMPLANTABLE CARDIOVERTER DEFIBRILLATOR (ICD) GENERATOR CHANGE Left 02/10/2017   Procedure: ICD GENERATOR CHANGE;  Surgeon: Isaias Cowman, MD;  Location: [facility];  Service: Cardiovascular;  Laterality: Left;  . INSERT / REPLACE / REMOVE PACEMAKER    .  LOWER EXTREMITY ANGIOGRAPHY Left 06/07/2017   Procedure: LOWER EXTREMITY ANGIOGRAPHY;  Surgeon: Algernon Huxley, MD;  Location: [place] [facility];  Service: Cardiovascular;  Laterality: Left;  . LOWER EXTREMITY INTERVENTION  06/07/2017   Procedure: LOWER EXTREMITY INTERVENTION;  Surgeon: Algernon Huxley, MD;  Location: [place] [facility];  Service: Cardiovascular;;  . PERIPHERAL VASCULAR CATHETERIZATION Left 12/09/2015   Procedure: Lower Extremity Angiography;  Surgeon: Algernon Huxley, MD;  Location: [place] [facility];  Service: Cardiovascular;  Laterality: Left;  . PERIPHERAL VASCULAR CATHETERIZATION  12/09/2015   Procedure: Lower Extremity Intervention;  Surgeon: Algernon Huxley, MD;  Location: [place] [facility];  Service: Cardiovascular;;  . TONSILLECTOMY      Family History  Problem Relation Age of Onset  . Cancer Mother   . Cancer Father   . Heart disease Father     Allergies  Allergen Reactions  . Apixaban Rash  . Rivaroxaban Rash       Assessment & Plan:   1. Atherosclerotic peripheral vascular disease with intermittent claudication (HCC)-stable  Recommend:  The patient has evidence of atherosclerosis of the lower extremities with claudication.  The patient does not voice lifestyle limiting changes at this point in time.  Noninvasive studies do not  suggest clinically significant change.  Patient will follow-up in 6 months.  We will also coordinate drainage of the seroma.  The patient is currently on a regular diet, which he should continue.  No invasive studies, angiography or surgery at this time The patient should continue walking and begin a more formal exercise program.  The patient should continue antiplatelet therapy and aggressive treatment of the lipid abnormalities  No changes in the patient's medications at this time  .   - VAS [place] LOWER EXTREMITY ARTERIAL DUPLEX; Future - VAS [place] ABI WITH/WO TBI; Future  2. COPD with acute exacerbation ([place]) -stable Continue pulmonary medications and aerosols as already ordered, these medications have been reviewed and there are no changes at this time.    3. Essential hypertension, benign-Stable Continue antihypertensive medications as already ordered, these medications have been reviewed and there are no changes at this time.    Current Outpatient Medications on File Prior to Visit  Medication Sig Dispense Refill  . acetaminophen (TYLENOL) 500 MG tablet Take 1,000 mg by mouth daily as needed for moderate pain or headache.    . ALPRAZolam (XANAX) 1 MG tablet Take 1 mg by mouth 2 (two) times daily.     Marland Kitchen aspirin EC 81 MG tablet Take 1 tablet (81 mg total) by mouth daily. 150 tablet 2  . azithromycin (ZITHROMAX) 250 MG tablet TAKE 2 TABLETS BY MOUTH TODAY, THEN TAKE 1 TABLET DAILY FOR 4 DAYS  0  . citalopram (CELEXA) 20 MG tablet Take 20 mg by mouth daily.  11  . Cyanocobalamin (B-12) 1000 MCG/ML KIT Inject 1,000 mcg as directed every 30 (thirty) days.    Marland Kitchen docusate sodium (COLACE) 100 MG capsule Take 100 mg by mouth at bedtime as needed (for constipation.).     Marland Kitchen fluticasone (FLONASE) 50 MCG/ACT nasal spray Place 2 sprays into both nostrils daily.    . fluticasone-salmeterol (ADVAIR HFA) 115-21 MCG/ACT inhaler USE 2 INHALATIONS ORALLY EVERY 12 HOURS 36 Inhaler 1  . furosemide (LASIX)  20 MG tablet Take 10 mg by mouth daily as needed (for fluid retention.).     Marland Kitchen guaiFENesin (MUCINEX) 600 MG 12 hr tablet Take 600 mg by mouth 2 (two) times daily.    Marland Kitchen  ipratropium (ATROVENT) 0.03 % nasal spray Place 2 sprays into both nostrils 2 (two) times daily.     . Ipratropium-Albuterol (COMBIVENT RESPIMAT) 20-100 MCG/ACT AERS respimat Inhale 1 puff into the lungs every 4 (four) hours.     Marland Kitchen levalbuterol (XOPENEX) 1.25 MG/3ML nebulizer solution Take 1.25 mg (3 mLs total) by nebulization every 4 (four) hours. (Patient taking differently: Take 3 mLs by nebulization every 4 (four) hours as needed for wheezing or shortness of breath ((typically twice daily)). ) 72 mL 12  . loratadine (CLARITIN) 10 MG tablet Take 10 mg by mouth at bedtime.    . lovastatin (MEVACOR) 20 MG tablet Take 20 mg by mouth at bedtime.    . metoprolol succinate (TOPROL-XL) 25 MG 24 hr tablet Take 25 mg by mouth daily.    . nitroGLYCERIN (NITROSTAT) 0.4 MG SL tablet Place 0.4 mg under the tongue every 5 (five) minutes as needed for chest pain.    Marland Kitchen nystatin ointment (MYCOSTATIN) Apply 1 application topically daily as needed (rash). Applied to corners of mouth  0  . oxyCODONE-acetaminophen (PERCOCET/ROXICET) 5-325 MG tablet One to Two Tabs By Mouth Every Six Hours As Needed For Pain 60 tablet 0  . pantoprazole (PROTONIX) 40 MG tablet Take 40 mg by mouth daily before breakfast.     . predniSONE (DELTASONE) 10 MG tablet 6 tabs x 1 day, 5 tabs x 1 day, 4 tabs x 1 day, etc...    . ramipril (ALTACE) 10 MG capsule Take 10 mg by mouth at bedtime.     . tamsulosin (FLOMAX) 0.4 MG CAPS capsule Take by mouth.    . Tiotropium Bromide Monohydrate (SPIRIVA RESPIMAT) 1.25 MCG/ACT AERS Inhale 2 puffs into the lungs daily. 12 g 2  . vitamin B-12 (CYANOCOBALAMIN) 1000 MCG tablet Take 1,000 mcg by mouth daily.    Marland Kitchen warfarin (COUMADIN) 5 MG tablet Take 5 mg by mouth at bedtime.      No current facility-administered medications on file prior to  visit.     There are no Patient Instructions on file for this visit. Return in about 6 months (around 07/03/2018).   Kris Hartmann, NP

## 2018-01-04 ENCOUNTER — Other Ambulatory Visit (INDEPENDENT_AMBULATORY_CARE_PROVIDER_SITE_OTHER): Payer: Self-pay | Admitting: Nurse Practitioner

## 2018-01-06 ENCOUNTER — Other Ambulatory Visit: Payer: Self-pay

## 2018-01-06 ENCOUNTER — Encounter
Admission: RE | Admit: 2018-01-06 | Discharge: 2018-01-06 | Disposition: A | Discharge status: Home / Self Care | Payer: Medicare HMO | Source: Ambulatory Visit | Attending: Vascular Surgery | Admitting: Vascular Surgery

## 2018-01-06 DIAGNOSIS — Z01812 Encounter for preprocedural laboratory examination: Secondary | ICD-10-CM | POA: Diagnosis not present

## 2018-01-06 DIAGNOSIS — X58XXXA Exposure to other specified factors, initial encounter: Secondary | ICD-10-CM | POA: Diagnosis not present

## 2018-01-06 DIAGNOSIS — Z01810 Encounter for preprocedural cardiovascular examination: Secondary | ICD-10-CM | POA: Diagnosis not present

## 2018-01-06 DIAGNOSIS — T148XXA Other injury of unspecified body region, initial encounter: Secondary | ICD-10-CM | POA: Insufficient documentation

## 2018-01-06 LAB — CBC WITH DIFFERENTIAL/PLATELET
BASOS ABS: 0.1 10*3/uL (ref 0–0.1)
BASOS PCT: 1 %
EOS ABS: 0.1 10*3/uL (ref 0–0.7)
EOS PCT: 1 %
HCT: 38.9 % — ABNORMAL LOW (ref 40.0–52.0)
Hemoglobin: 13.3 g/dL (ref 13.0–18.0)
Lymphocytes Relative: 12 %
Lymphs Abs: 1.2 10*3/uL (ref 1.0–3.6)
MCH: 29.8 pg (ref 26.0–34.0)
MCHC: 34.2 g/dL (ref 32.0–36.0)
MCV: 87.1 fL (ref 80.0–100.0)
MONO ABS: 0.8 10*3/uL (ref 0.2–1.0)
Monocytes Relative: 8 %
Neutro Abs: 7.6 10*3/uL — ABNORMAL HIGH (ref 1.4–6.5)
Neutrophils Relative %: 78 %
PLATELETS: 252 10*3/uL (ref 150–440)
RBC: 4.47 MIL/uL (ref 4.40–5.90)
RDW: 15.1 % — AB (ref 11.5–14.5)
WBC: 9.7 10*3/uL (ref 3.8–10.6)

## 2018-01-06 LAB — BASIC METABOLIC PANEL
Anion gap: 7 (ref 5–15)
BUN: 10 mg/dL (ref 8–23)
CHLORIDE: 97 mmol/L — AB (ref 98–111)
CO2: 29 mmol/L (ref 22–32)
Calcium: 9.3 mg/dL (ref 8.9–10.3)
Creatinine, Ser: 0.74 mg/dL (ref 0.61–1.24)
GFR calc Af Amer: >60 mL/min (ref >60)
GFR calc non Af Amer: >60 mL/min (ref >60)
GLUCOSE: 86 mg/dL (ref 70–99)
Potassium: 4.1 mmol/L (ref 3.5–5.1)
Sodium: 133 mmol/L — ABNORMAL LOW (ref 135–145)

## 2018-01-06 LAB — TYPE AND SCREEN
ABO/RH(D): A POS
Antibody Screen: NEGATIVE

## 2018-01-06 NOTE — Patient Instructions (Signed)
Your procedure is scheduled on: Wed. 8/21 Report to Day Surgery. To find out your arrival time please call [phone] between 1PM - 3PM on Tues. 8/20.  Remember: Instructions that are not followed completely may result in serious medical risk,  up to and including death, or upon the discretion of your surgeon and anesthesiologist your  surgery may need to be rescheduled.     _X__ 1. Do not eat food after midnight the night before your procedure.                 No gum chewing or hard candies. You may drink clear liquids up to 2 hours                 before you are scheduled to arrive for your surgery- DO not drink clear                 liquids within 2 hours of the start of your surgery.                 Clear Liquids include:  water, apple juice without pulp, clear carbohydrate                 drink such as Clearfast of Gatorade, Black Coffee or Tea (Do not add                 anything to coffee or tea).  __X__2.  On the morning of surgery brush your teeth with toothpaste and water, you                may rinse your mouth with mouthwash if you wish.  Do not swallow any toothpaste of mouthwash.     ___ 3.  No Alcohol for 24 hours before or after surgery.   ___ 4.  Do Not Smoke or use e-cigarettes For 24 Hours Prior to Your Surgery.                 Do not use any chewable tobacco products for at least 6 hours prior to                 surgery.  ____  5.  Bring all medications with you on the day of surgery if instructed.   _x___  6.  Notify your doctor if there is any change in your medical condition      (cold, fever, infections).     Do not wear jewelry, make-up, hairpins, clips or nail polish. Do not wear lotions, powders, or perfumes. You may wear deodorant. Do not shave 48 hours prior to surgery. Men may shave face and neck. Do not bring valuables to the hospital.    [facility] is not responsible for any belongings or valuables.  Contacts, dentures  or bridgework may not be worn into surgery. Leave your suitcase in the car. After surgery it may be brought to your room. For patients admitted to the hospital, discharge time is determined by your treatment team.   Patients discharged the day of surgery will not be allowed to drive home.   Please read over the following fact sheets that you were given:    _x___ Take these medicines the morning of surgery with A SIP OF WATER:    1. ALPRAZolam (XANAX) 1 MG tablet  2. fluticasone (FLONASE) 50 MCG/ACT nasal spray  3. guaiFENesin (MUCINEX) 600 MG 12 hr tablet  4.metoprolol succinate (TOPROL-XL) 25 MG 24 hr tablet  5.oxyCODONE-acetaminophen (PERCOCET/ROXICET) 5-325  MG tablet  6.pantoprazole (PROTONIX) 40 MG tablet  Take extra dose the night before surgery and one the morning of surgery  ____ Fleet Enema (as directed)   __x__ Use CHG Soap as directed  _x___ Use inhalers on the day of surgeryTiotropium Bromide Monohydrate (SPIRIVA RESPIMAT) 1.25 MCG/ACT AERS,Ipratropium-Albuterol (COMBIVENT RESPIMAT) 20-100 MCG/ACT AERS respimat  ( and bring with you to the hospital),fluticasone-salmeterol (ADVAIR HFA) 115-21 MCG/ACT inhaler  ____ Stop metformin 2 days prior to surgery    ____ Take 1/2 of usual insulin dose the night before surgery. No insulin the morning          of surgery.   __x__ Stop aspirin and Coumadin per Dr. Bunnie Domino Instructions  ____ Stop Anti-inflammatories on    ____ Stop supplements until after surgery.    ____ Bring C-Pap to the hospital.

## 2018-01-07 ENCOUNTER — Telehealth (INDEPENDENT_AMBULATORY_CARE_PROVIDER_SITE_OTHER): Payer: Self-pay

## 2018-01-07 NOTE — Pre-Procedure Instructions (Signed)
Faxed pacemaker/ICD form to Dr. Alveria Apley office

## 2018-01-07 NOTE — Pre-Procedure Instructions (Signed)
Idelle Jo at Dr. Bunnie Domino office to notify her that we wil do the PT PTT after he is off of coumadin for surgery.  Please contact patient and let him know when he should stop his ASA and coumadin for surgery.  If they plan on doing a wound vac.  Arrangements must be made for supplies.  Have a relationship with [facility].

## 2018-01-07 NOTE — Telephone Encounter (Addendum)
FYI--She called and stated that she couldn't get this patient approved with [employer] because she was missing some paperwork, so I faxed her what she thinks was missing, because she does not have access to access this information.  She states that once this information is sent in, that she can move forward with his approval. She was appreciative for the information that you had already sent over to get the process started.

## 2018-01-07 NOTE — Telephone Encounter (Signed)
Error

## 2018-01-11 MED ORDER — CEFAZOLIN SODIUM-DEXTROSE 2-4 GM/100ML-% IV SOLN
2.0000 g | INTRAVENOUS | Status: AC
  Administered 2018-01-12: 2 g via INTRAVENOUS

## 2018-01-12 ENCOUNTER — Other Ambulatory Visit: Payer: Self-pay

## 2018-01-12 ENCOUNTER — Ambulatory Visit: Payer: Medicare HMO | Admitting: Anesthesiology

## 2018-01-12 ENCOUNTER — Ambulatory Visit
Admission: RE | Admit: 2018-01-12 | Discharge: 2018-01-12 | Disposition: A | Discharge status: Home / Self Care | Payer: Medicare HMO | Source: Ambulatory Visit | Attending: Vascular Surgery | Admitting: Vascular Surgery

## 2018-01-12 ENCOUNTER — Encounter
Admission: RE | Disposition: A | Discharge status: Home / Self Care | Payer: Self-pay | Source: Ambulatory Visit | Attending: Vascular Surgery

## 2018-01-12 DIAGNOSIS — Z79899 Other long term (current) drug therapy: Secondary | ICD-10-CM | POA: Insufficient documentation

## 2018-01-12 DIAGNOSIS — K209 Esophagitis, unspecified: Secondary | ICD-10-CM | POA: Insufficient documentation

## 2018-01-12 DIAGNOSIS — E782 Mixed hyperlipidemia: Secondary | ICD-10-CM | POA: Diagnosis not present

## 2018-01-12 DIAGNOSIS — I480 Paroxysmal atrial fibrillation: Secondary | ICD-10-CM | POA: Diagnosis not present

## 2018-01-12 DIAGNOSIS — I4891 Unspecified atrial fibrillation: Secondary | ICD-10-CM | POA: Insufficient documentation

## 2018-01-12 DIAGNOSIS — I2510 Atherosclerotic heart disease of native coronary artery without angina pectoris: Secondary | ICD-10-CM | POA: Insufficient documentation

## 2018-01-12 DIAGNOSIS — I509 Heart failure, unspecified: Secondary | ICD-10-CM | POA: Diagnosis not present

## 2018-01-12 DIAGNOSIS — I110 Hypertensive heart disease with heart failure: Secondary | ICD-10-CM | POA: Diagnosis not present

## 2018-01-12 DIAGNOSIS — T792XXA Traumatic secondary and recurrent hemorrhage and seroma, initial encounter: Secondary | ICD-10-CM | POA: Diagnosis not present

## 2018-01-12 DIAGNOSIS — S7012XA Contusion of left thigh, initial encounter: Secondary | ICD-10-CM | POA: Insufficient documentation

## 2018-01-12 DIAGNOSIS — L7634 Postprocedural seroma of skin and subcutaneous tissue following other procedure: Secondary | ICD-10-CM

## 2018-01-12 DIAGNOSIS — X58XXXA Exposure to other specified factors, initial encounter: Secondary | ICD-10-CM | POA: Diagnosis not present

## 2018-01-12 DIAGNOSIS — L03314 Cellulitis of groin: Secondary | ICD-10-CM | POA: Diagnosis not present

## 2018-01-12 DIAGNOSIS — Z7982 Long term (current) use of aspirin: Secondary | ICD-10-CM | POA: Diagnosis not present

## 2018-01-12 DIAGNOSIS — Z950 Presence of cardiac pacemaker: Secondary | ICD-10-CM | POA: Diagnosis not present

## 2018-01-12 DIAGNOSIS — J441 Chronic obstructive pulmonary disease with (acute) exacerbation: Secondary | ICD-10-CM | POA: Diagnosis not present

## 2018-01-12 DIAGNOSIS — I739 Peripheral vascular disease, unspecified: Secondary | ICD-10-CM | POA: Diagnosis not present

## 2018-01-12 DIAGNOSIS — I5022 Chronic systolic (congestive) heart failure: Secondary | ICD-10-CM | POA: Diagnosis not present

## 2018-01-12 DIAGNOSIS — Z87891 Personal history of nicotine dependence: Secondary | ICD-10-CM | POA: Diagnosis not present

## 2018-01-12 DIAGNOSIS — Y929 Unspecified place or not applicable: Secondary | ICD-10-CM | POA: Diagnosis not present

## 2018-01-12 DIAGNOSIS — I252 Old myocardial infarction: Secondary | ICD-10-CM | POA: Diagnosis not present

## 2018-01-12 DIAGNOSIS — M1990 Unspecified osteoarthritis, unspecified site: Secondary | ICD-10-CM | POA: Diagnosis not present

## 2018-01-12 HISTORY — PX: HEMATOMA EVACUATION: SHX5118

## 2018-01-12 HISTORY — PX: APPLICATION OF WOUND VAC: SHX5189

## 2018-01-12 LAB — APTT: APTT: 31 s (ref 24–36)

## 2018-01-12 LAB — PROTIME-INR
INR: 1.19
Prothrombin Time: 15 seconds (ref 11.4–15.2)

## 2018-01-12 SURGERY — EVACUATION HEMATOMA
Anesthesia: General | Laterality: Left | Wound class: Clean Contaminated

## 2018-01-12 MED ORDER — FENTANYL CITRATE (PF) 100 MCG/2ML IJ SOLN
INTRAMUSCULAR | Status: DC | PRN
  Administered 2018-01-12 (×2): 50 ug via INTRAVENOUS

## 2018-01-12 MED ORDER — LIDOCAINE HCL (PF) 2 % IJ SOLN
INTRAMUSCULAR | Status: AC
  Filled 2018-01-12: qty 10

## 2018-01-12 MED ORDER — PROPOFOL 500 MG/50ML IV EMUL
INTRAVENOUS | Status: DC | PRN
  Administered 2018-01-12: 50 ug/kg/min via INTRAVENOUS

## 2018-01-12 MED ORDER — BUPIVACAINE HCL (PF) 0.5 % IJ SOLN
INTRAMUSCULAR | Status: DC | PRN
  Administered 2018-01-12: 10 mL

## 2018-01-12 MED ORDER — BUPIVACAINE HCL (PF) 0.5 % IJ SOLN
INTRAMUSCULAR | Status: AC
  Filled 2018-01-12: qty 30

## 2018-01-12 MED ORDER — LACTATED RINGERS IV SOLN
INTRAVENOUS | Status: DC
  Administered 2018-01-12: 12:00:00 via INTRAVENOUS

## 2018-01-12 MED ORDER — CEFAZOLIN SODIUM-DEXTROSE 2-4 GM/100ML-% IV SOLN
INTRAVENOUS | Status: AC
  Filled 2018-01-12: qty 100

## 2018-01-12 MED ORDER — LIDOCAINE HCL (CARDIAC) PF 100 MG/5ML IV SOSY
PREFILLED_SYRINGE | INTRAVENOUS | Status: DC | PRN
  Administered 2018-01-12: 40 mg via INTRAVENOUS

## 2018-01-12 MED ORDER — FENTANYL CITRATE (PF) 100 MCG/2ML IJ SOLN: 25.0000 ug | INTRAMUSCULAR | Status: DC | PRN

## 2018-01-12 MED ORDER — OXYCODONE HCL 5 MG PO TABS: 5.0000 mg | ORAL_TABLET | Freq: Once | ORAL | Status: DC | PRN

## 2018-01-12 MED ORDER — FENTANYL CITRATE (PF) 100 MCG/2ML IJ SOLN
INTRAMUSCULAR | Status: AC
  Filled 2018-01-12: qty 2

## 2018-01-12 MED ORDER — MIDAZOLAM HCL 2 MG/2ML IJ SOLN
INTRAMUSCULAR | Status: AC
  Filled 2018-01-12: qty 2

## 2018-01-12 MED ORDER — MIDAZOLAM HCL 2 MG/2ML IJ SOLN
INTRAMUSCULAR | Status: DC | PRN
  Administered 2018-01-12 (×2): 1 mg via INTRAVENOUS

## 2018-01-12 MED ORDER — PHENYLEPHRINE HCL 10 MG/ML IJ SOLN
INTRAMUSCULAR | Status: DC | PRN
  Administered 2018-01-12: 50 ug via INTRAVENOUS
  Administered 2018-01-12: 100 ug via INTRAVENOUS

## 2018-01-12 MED ORDER — OXYCODONE HCL 5 MG/5ML PO SOLN: 5.0000 mg | Freq: Once | ORAL | Status: DC | PRN

## 2018-01-12 MED ORDER — OXYCODONE-ACETAMINOPHEN 7.5-325 MG PO TABS: 1.0000 | ORAL_TABLET | ORAL | 0 refills | Status: DC | PRN

## 2018-01-12 SURGICAL SUPPLY — 16 items
CANISTER SUCT 1200ML W/VALVE (MISCELLANEOUS) ×3 IMPLANT
DRAPE INCISE IOBAN 66X45 STRL (DRAPES) IMPLANT
DRAPE LAPAROTOMY 100X77 ABD (DRAPES) ×3 IMPLANT
ELECT REM PT RETURN 9FT ADLT (ELECTROSURGICAL) ×3
ELECTRODE REM PT RTRN 9FT ADLT (ELECTROSURGICAL) ×1 IMPLANT
GLOVE BIO SURGEON STRL SZ7 (GLOVE) ×3 IMPLANT
GOWN STRL REUS W/ TWL LRG LVL3 (GOWN DISPOSABLE) ×1 IMPLANT
GOWN STRL REUS W/ TWL XL LVL3 (GOWN DISPOSABLE) ×1 IMPLANT
GOWN STRL REUS W/TWL LRG LVL3 (GOWN DISPOSABLE) ×2
GOWN STRL REUS W/TWL XL LVL3 (GOWN DISPOSABLE) ×2
KIT TURNOVER KIT A (KITS) ×3 IMPLANT
NS IRRIG 1000ML POUR BTL (IV SOLUTION) ×3 IMPLANT
PACK BASIN MINOR ARMC (MISCELLANEOUS) ×3 IMPLANT
SOL PREP PVP 2OZ (MISCELLANEOUS)
SOLUTION PREP PVP 2OZ (MISCELLANEOUS) IMPLANT
SPONGE LAP 18X18 RF (DISPOSABLE) ×3 IMPLANT

## 2018-01-12 NOTE — Discharge Instructions (Signed)

## 2018-01-12 NOTE — OR Nursing (Signed)
Discharge instructions discussed with pt and wife. Both voice understanding. 

## 2018-01-12 NOTE — Anesthesia Preprocedure Evaluation (Addendum)
Anesthesia Evaluation  Patient identified by MRN, date of birth, ID band Patient awake    Reviewed: Allergy & Precautions, H&P , NPO status , Patient's Chart, lab work & pertinent test results  History of Anesthesia Complications Negative for: history of anesthetic complications  Airway Mallampati: II  TM Distance: >3 FB Neck ROM: full    Dental  (+) Chipped, Poor Dentition, Missing   Pulmonary shortness of breath, COPD,  COPD inhaler and oxygen dependent, former smoker,           Cardiovascular Exercise Tolerance: Good hypertension, + CAD, + Past MI, + Cardiac Stents, + Peripheral Vascular Disease and +CHF  + pacemaker + Cardiac Defibrillator   Echo 03/17/17: MODERATE LV SYSTOLIC DYSFUNCTION WITH AN ESTIMATED EF = 35 % NORMAL RIGHT VENTRICULAR SYSTOLIC FUNCTION MODERATE-TO-SEVERE MITRAL AND TRICUSPID VALVE INSUFFICIENCY NO VALVULAR STENOSIS MODERATE BIATRIAL ENLARGEMENT MILD LV ENLARGEMENT PACER WIRE NOTED   Neuro/Psych PSYCHIATRIC DISORDERS Depression negative neurological ROS     GI/Hepatic Neg liver ROS, GERD  Medicated and Controlled,  Endo/Other  negative endocrine ROS  Renal/GU negative Renal ROS  negative genitourinary   Musculoskeletal  (+) Arthritis ,   Abdominal   Peds  Hematology negative hematology ROS (+)   Anesthesia Other Findings Past Medical History: No date: AICD (automatic cardioverter/defibrillator) present No date: Arthritis No date: Atrial fibrillation (HCC) No date: Cervical spinal stenosis     Comment:  with neuropathy No date: CHF (congestive heart failure) (HCC) No date: COPD (chronic obstructive pulmonary disease) (HCC) No date: Coronary artery disease No date: Depression No date: Dyspnea No date: GERD (gastroesophageal reflux disease) No date: Hypertension No date: Myocardial infarction ([place]) No date: Peripheral vascular disease (HCC) No date: Presence of permanent cardiac  pacemaker  Past Surgical History: No date: ABOVE KNEE LEG AMPUTATION; Right     Comment:  after below the knee amputation  No date: BELOW KNEE LEG AMPUTATION; Right No date: CATARACT EXTRACTION, BILATERAL; Bilateral 08/11/2017: ENDARTERECTOMY FEMORAL; Left     Comment:  Procedure: ENDARTERECTOMY FEMORAL;  Surgeon: Algernon Huxley, MD;  Location: [facility];  Service: Vascular;                Laterality: Left; 02/10/2017: IMPLANTABLE CARDIOVERTER DEFIBRILLATOR (ICD) GENERATOR  CHANGE; Left     Comment:  Procedure: ICD GENERATOR CHANGE;  Surgeon: Isaias Cowman, MD;  Location: [facility];  Service:               Cardiovascular;  Laterality: Left; No date: INSERT / REPLACE / REMOVE PACEMAKER 06/07/2017: LOWER EXTREMITY ANGIOGRAPHY; Left     Comment:  Procedure: LOWER EXTREMITY ANGIOGRAPHY;  Surgeon: Algernon Huxley, MD;  Location: [place] [facility];  Service:               Cardiovascular;  Laterality: Left; 06/07/2017: LOWER EXTREMITY INTERVENTION     Comment:  Procedure: LOWER EXTREMITY INTERVENTION;  Surgeon: Algernon Huxley, MD;  Location: [place] [facility];  Service:               Cardiovascular;; 12/09/2015: PERIPHERAL VASCULAR CATHETERIZATION; Left     Comment:  Procedure: Lower Extremity Angiography;  Surgeon: Corene Cornea  Bunnie Domino, MD;  Location: [place] [facility];  Service:               Cardiovascular;  Laterality: Left; 12/09/2015: PERIPHERAL VASCULAR CATHETERIZATION     Comment:  Procedure: Lower Extremity Intervention;  Surgeon: Algernon Huxley, MD;  Location: [place] [facility];  Service:               Cardiovascular;; No date: TONSILLECTOMY     Reproductive/Obstetrics negative OB ROS                            Anesthesia Physical Anesthesia Plan  ASA: IV  Anesthesia Plan: General   Post-op Pain Management:    Induction: Intravenous  PONV Risk Score and Plan:  Propofol infusion and TIVA  Airway Management Planned: Natural Airway and Nasal Cannula  Additional Equipment:   Intra-op Plan:   Post-operative Plan:   Informed Consent: I have reviewed the patients History and Physical, chart, labs and discussed the procedure including the risks, benefits and alternatives for the proposed anesthesia with the patient or authorized representative who has indicated his/her understanding and acceptance.   Dental Advisory Given  Plan Discussed with: Anesthesiologist, CRNA and Surgeon  Anesthesia Plan Comments: (Patient consented for risks of anesthesia including but not limited to:  - adverse reactions to medications - risk of intubation if required - damage to teeth, lips or other oral mucosa - sore throat or hoarseness - Damage to heart, brain, lungs or loss of life  Patient voiced understanding.)        Anesthesia Quick Evaluation

## 2018-01-12 NOTE — Op Note (Signed)
    OPERATIVE NOTE   PROCEDURE: 1. Irrigation and drainage of left thigh seroma with negative pressure dressing placement  PRE-OPERATIVE DIAGNOSIS: Seroma of the left thigh  POST-OPERATIVE DIAGNOSIS: Same as above  SURGEON: Leotis Pain, MD  ASSISTANT(S): none  ANESTHESIA: MAC  ESTIMATED BLOOD LOSS: 2 cc  FINDING(S): None  SPECIMEN(S):  Seroma sac  INDICATIONS:   Tyler [name] is a 62 y.o. male who presents with a symptomatic seroma sac. It is painful and persistent after 5 months.  He desires to have this drained.  Risks and benefits are discussed.  DESCRIPTION: After obtaining full informed written consent, the patient was brought back to the operating room and placed supine upon the operating table.  The patient received IV antibiotics prior to induction.  After obtaining adequate anesthesia, the patient was prepped and draped in the standard fashion.  The wound was then opened and excisional debridement was performed on the seroma sac after a large amount of clear fluid consistent with a seroma was evacuated.  The debridement was performed with cautery and encompassed an area of approximately 20 cm2.  The wound was then closed with two layers of 3-0 Vicryl to close the dead space a negative pressure dressing on the skin. The patient was then awakened from anesthesia and taken to the recovery room in stable condition having tolerated the procedure well.  COMPLICATIONS: none  CONDITION: stable  Leotis Pain  01/12/2018, 2:07 PM   This note was created with Dragon Medical transcription system. Any errors in dictation are purely unintentional.

## 2018-01-12 NOTE — Transfer of Care (Signed)
Immediate Anesthesia Transfer of Care Note  Patient: Tyler [name]  Procedure(s) Performed: EVACUATION HEMATOMA ( DRAINING OF SEROMA) (Left ) APPLICATION OF WOUND VAC (Left )  Patient Location: PACU  Anesthesia Type:MAC  Level of Consciousness: awake, alert  and oriented  Airway & Oxygen Therapy: Patient Spontanous Breathing  Post-op Assessment: Report given to RN and Post -op Vital signs reviewed and stable  Post vital signs: Reviewed and stable  Last Vitals:  Vitals Value Taken Time  BP 107/63 01/12/2018  2:03 PM  Temp 36.6 C 01/12/2018  2:03 PM  Pulse 74 01/12/2018  2:06 PM  Resp 16 01/12/2018  2:06 PM  SpO2 100 % 01/12/2018  2:06 PM  Vitals shown include unvalidated device data.  Last Pain:  Vitals:   01/12/18 1403  TempSrc:   PainSc: 0-No pain         Complications: No apparent anesthesia complications

## 2018-01-12 NOTE — H&P (Signed)
[facility] History & Physical Update  The patient was interviewed and re-examined.  The patient's previous History and Physical has been reviewed and is unchanged.  There is no change in the plan of care. We plan to proceed with the scheduled procedure.  Leotis Pain, MD  01/12/2018, 12:31 PM

## 2018-01-12 NOTE — OR Nursing (Signed)
Patient is currently being seen by palliative care for sacral wound. Wound currently has a dressing and unable to visualize. Dr Lucky Cowboy made aware.  Patient also has bandage on left elbow from a skin tear prior to arriving today.

## 2018-01-12 NOTE — Anesthesia Post-op Follow-up Note (Signed)
Anesthesia QCDR form completed.        

## 2018-01-12 NOTE — Anesthesia Postprocedure Evaluation (Signed)
Anesthesia Post Note  Patient: Tyler [name]  Procedure(s) Performed: EVACUATION HEMATOMA ( DRAINING OF SEROMA) (Left ) APPLICATION OF WOUND VAC (Left )  Patient location during evaluation: PACU Anesthesia Type: General Level of consciousness: awake and alert Pain management: pain level controlled Vital Signs Assessment: post-procedure vital signs reviewed and stable Respiratory status: spontaneous breathing, nonlabored ventilation, respiratory function stable and patient connected to nasal cannula oxygen Cardiovascular status: blood pressure returned to baseline and stable Postop Assessment: no apparent nausea or vomiting Anesthetic complications: no     Last Vitals:  Vitals:   01/12/18 1403 01/12/18 1418  BP: 107/63 114/71  Pulse: 74 74  Resp: 15 15  Temp: 36.6 C   SpO2: 100% 100%    Last Pain:  Vitals:   01/12/18 1418  TempSrc:   PainSc: 0-No pain                 Durenda Hurt

## 2018-01-13 ENCOUNTER — Encounter: Payer: Self-pay | Admitting: Vascular Surgery

## 2018-01-15 DIAGNOSIS — I97648 Postprocedural seroma of a circulatory system organ or structure following other circulatory system procedure: Secondary | ICD-10-CM | POA: Diagnosis not present

## 2018-01-15 DIAGNOSIS — I110 Hypertensive heart disease with heart failure: Secondary | ICD-10-CM | POA: Diagnosis not present

## 2018-01-15 DIAGNOSIS — I739 Peripheral vascular disease, unspecified: Secondary | ICD-10-CM | POA: Diagnosis not present

## 2018-01-15 DIAGNOSIS — J441 Chronic obstructive pulmonary disease with (acute) exacerbation: Secondary | ICD-10-CM | POA: Diagnosis not present

## 2018-01-15 DIAGNOSIS — L89152 Pressure ulcer of sacral region, stage 2: Secondary | ICD-10-CM | POA: Diagnosis not present

## 2018-01-15 DIAGNOSIS — G629 Polyneuropathy, unspecified: Secondary | ICD-10-CM | POA: Diagnosis not present

## 2018-01-15 DIAGNOSIS — I4891 Unspecified atrial fibrillation: Secondary | ICD-10-CM | POA: Diagnosis not present

## 2018-01-15 DIAGNOSIS — I509 Heart failure, unspecified: Secondary | ICD-10-CM | POA: Diagnosis not present

## 2018-01-15 DIAGNOSIS — I2510 Atherosclerotic heart disease of native coronary artery without angina pectoris: Secondary | ICD-10-CM | POA: Diagnosis not present

## 2018-01-15 LAB — SURGICAL PATHOLOGY

## 2018-01-17 ENCOUNTER — Telehealth (INDEPENDENT_AMBULATORY_CARE_PROVIDER_SITE_OTHER): Payer: Self-pay

## 2018-01-17 DIAGNOSIS — I70219 Atherosclerosis of native arteries of extremities with intermittent claudication, unspecified extremity: Secondary | ICD-10-CM | POA: Diagnosis not present

## 2018-01-17 DIAGNOSIS — I2510 Atherosclerotic heart disease of native coronary artery without angina pectoris: Secondary | ICD-10-CM | POA: Diagnosis not present

## 2018-01-17 DIAGNOSIS — I509 Heart failure, unspecified: Secondary | ICD-10-CM | POA: Diagnosis not present

## 2018-01-17 DIAGNOSIS — I482 Chronic atrial fibrillation: Secondary | ICD-10-CM | POA: Diagnosis not present

## 2018-01-17 DIAGNOSIS — J441 Chronic obstructive pulmonary disease with (acute) exacerbation: Secondary | ICD-10-CM | POA: Diagnosis not present

## 2018-01-17 DIAGNOSIS — I4891 Unspecified atrial fibrillation: Secondary | ICD-10-CM | POA: Diagnosis not present

## 2018-01-17 DIAGNOSIS — L03032 Cellulitis of left toe: Secondary | ICD-10-CM | POA: Diagnosis not present

## 2018-01-17 DIAGNOSIS — I110 Hypertensive heart disease with heart failure: Secondary | ICD-10-CM | POA: Diagnosis not present

## 2018-01-17 DIAGNOSIS — I739 Peripheral vascular disease, unspecified: Secondary | ICD-10-CM | POA: Diagnosis not present

## 2018-01-17 DIAGNOSIS — I97648 Postprocedural seroma of a circulatory system organ or structure following other circulatory system procedure: Secondary | ICD-10-CM | POA: Diagnosis not present

## 2018-01-17 DIAGNOSIS — L89152 Pressure ulcer of sacral region, stage 2: Secondary | ICD-10-CM | POA: Diagnosis not present

## 2018-01-17 DIAGNOSIS — G629 Polyneuropathy, unspecified: Secondary | ICD-10-CM | POA: Diagnosis not present

## 2018-01-17 NOTE — Telephone Encounter (Signed)
Nurse called and stated that she needs clear wound vac orders for this patient.  She needs frequency and duration. All she has so far is that the wound Vac is to be changed 3 times a week.  I will call her back and give her a verbal

## 2018-01-17 NOTE — Telephone Encounter (Signed)
Called the nurse back and let her know that Dr. Lucky Cowboy said that she can order the sponge for this patient.

## 2018-01-17 NOTE — Telephone Encounter (Signed)
Nurse says that on Saturday the wound was completely closed and the wound Vac had absolutely no fluid in it. She just wants to make sure that the incision is not closing prematurely, there was no wound bed to put the sponge in the wound. She doesn't want the wound to reopen while she is applying the Vac. She would like to know if that's how you closed it off completely?

## 2018-01-17 NOTE — Telephone Encounter (Signed)
FYI---Nurse states that she just saw the patient, there was no drainage in the container again, and that the area is more swollen today than it was Saturday.    Nurse also wants to know if she can order some [place] sponge to go on the patient's pressure sore that he is developing on his bottom from sitting so much?

## 2018-01-18 DIAGNOSIS — L03314 Cellulitis of groin: Secondary | ICD-10-CM | POA: Diagnosis not present

## 2018-01-21 DIAGNOSIS — I97648 Postprocedural seroma of a circulatory system organ or structure following other circulatory system procedure: Secondary | ICD-10-CM | POA: Diagnosis not present

## 2018-01-21 DIAGNOSIS — I509 Heart failure, unspecified: Secondary | ICD-10-CM | POA: Diagnosis not present

## 2018-01-21 DIAGNOSIS — I739 Peripheral vascular disease, unspecified: Secondary | ICD-10-CM | POA: Diagnosis not present

## 2018-01-21 DIAGNOSIS — I2510 Atherosclerotic heart disease of native coronary artery without angina pectoris: Secondary | ICD-10-CM | POA: Diagnosis not present

## 2018-01-21 DIAGNOSIS — I4891 Unspecified atrial fibrillation: Secondary | ICD-10-CM | POA: Diagnosis not present

## 2018-01-21 DIAGNOSIS — I110 Hypertensive heart disease with heart failure: Secondary | ICD-10-CM | POA: Diagnosis not present

## 2018-01-21 DIAGNOSIS — G629 Polyneuropathy, unspecified: Secondary | ICD-10-CM | POA: Diagnosis not present

## 2018-01-21 DIAGNOSIS — J441 Chronic obstructive pulmonary disease with (acute) exacerbation: Secondary | ICD-10-CM | POA: Diagnosis not present

## 2018-01-21 DIAGNOSIS — L89152 Pressure ulcer of sacral region, stage 2: Secondary | ICD-10-CM | POA: Diagnosis not present

## 2018-01-23 DIAGNOSIS — J441 Chronic obstructive pulmonary disease with (acute) exacerbation: Secondary | ICD-10-CM | POA: Diagnosis not present

## 2018-01-24 DIAGNOSIS — I97648 Postprocedural seroma of a circulatory system organ or structure following other circulatory system procedure: Secondary | ICD-10-CM | POA: Diagnosis not present

## 2018-01-24 DIAGNOSIS — I739 Peripheral vascular disease, unspecified: Secondary | ICD-10-CM | POA: Diagnosis not present

## 2018-01-24 DIAGNOSIS — I4891 Unspecified atrial fibrillation: Secondary | ICD-10-CM | POA: Diagnosis not present

## 2018-01-24 DIAGNOSIS — L89152 Pressure ulcer of sacral region, stage 2: Secondary | ICD-10-CM | POA: Diagnosis not present

## 2018-01-24 DIAGNOSIS — J441 Chronic obstructive pulmonary disease with (acute) exacerbation: Secondary | ICD-10-CM | POA: Diagnosis not present

## 2018-01-24 DIAGNOSIS — I110 Hypertensive heart disease with heart failure: Secondary | ICD-10-CM | POA: Diagnosis not present

## 2018-01-24 DIAGNOSIS — G629 Polyneuropathy, unspecified: Secondary | ICD-10-CM | POA: Diagnosis not present

## 2018-01-24 DIAGNOSIS — I2510 Atherosclerotic heart disease of native coronary artery without angina pectoris: Secondary | ICD-10-CM | POA: Diagnosis not present

## 2018-01-24 DIAGNOSIS — I509 Heart failure, unspecified: Secondary | ICD-10-CM | POA: Diagnosis not present

## 2018-01-25 DIAGNOSIS — L89151 Pressure ulcer of sacral region, stage 1: Secondary | ICD-10-CM | POA: Diagnosis not present

## 2018-01-25 DIAGNOSIS — D6851 Activated protein C resistance: Secondary | ICD-10-CM | POA: Diagnosis not present

## 2018-01-25 DIAGNOSIS — I739 Peripheral vascular disease, unspecified: Secondary | ICD-10-CM | POA: Diagnosis not present

## 2018-01-25 DIAGNOSIS — I70219 Atherosclerosis of native arteries of extremities with intermittent claudication, unspecified extremity: Secondary | ICD-10-CM | POA: Diagnosis not present

## 2018-01-25 DIAGNOSIS — I10 Essential (primary) hypertension: Secondary | ICD-10-CM | POA: Diagnosis not present

## 2018-01-25 DIAGNOSIS — J431 Panlobular emphysema: Secondary | ICD-10-CM | POA: Diagnosis not present

## 2018-01-25 DIAGNOSIS — I482 Chronic atrial fibrillation: Secondary | ICD-10-CM | POA: Diagnosis not present

## 2018-01-28 DIAGNOSIS — I110 Hypertensive heart disease with heart failure: Secondary | ICD-10-CM | POA: Diagnosis not present

## 2018-01-28 DIAGNOSIS — G629 Polyneuropathy, unspecified: Secondary | ICD-10-CM | POA: Diagnosis not present

## 2018-01-28 DIAGNOSIS — L89152 Pressure ulcer of sacral region, stage 2: Secondary | ICD-10-CM | POA: Diagnosis not present

## 2018-01-28 DIAGNOSIS — I4891 Unspecified atrial fibrillation: Secondary | ICD-10-CM | POA: Diagnosis not present

## 2018-01-28 DIAGNOSIS — I509 Heart failure, unspecified: Secondary | ICD-10-CM | POA: Diagnosis not present

## 2018-01-28 DIAGNOSIS — J441 Chronic obstructive pulmonary disease with (acute) exacerbation: Secondary | ICD-10-CM | POA: Diagnosis not present

## 2018-01-28 DIAGNOSIS — I97648 Postprocedural seroma of a circulatory system organ or structure following other circulatory system procedure: Secondary | ICD-10-CM | POA: Diagnosis not present

## 2018-01-28 DIAGNOSIS — I2510 Atherosclerotic heart disease of native coronary artery without angina pectoris: Secondary | ICD-10-CM | POA: Diagnosis not present

## 2018-01-28 DIAGNOSIS — I739 Peripheral vascular disease, unspecified: Secondary | ICD-10-CM | POA: Diagnosis not present

## 2018-01-31 DIAGNOSIS — L89152 Pressure ulcer of sacral region, stage 2: Secondary | ICD-10-CM | POA: Diagnosis not present

## 2018-01-31 DIAGNOSIS — I739 Peripheral vascular disease, unspecified: Secondary | ICD-10-CM | POA: Diagnosis not present

## 2018-01-31 DIAGNOSIS — I4891 Unspecified atrial fibrillation: Secondary | ICD-10-CM | POA: Diagnosis not present

## 2018-01-31 DIAGNOSIS — I2510 Atherosclerotic heart disease of native coronary artery without angina pectoris: Secondary | ICD-10-CM | POA: Diagnosis not present

## 2018-01-31 DIAGNOSIS — G629 Polyneuropathy, unspecified: Secondary | ICD-10-CM | POA: Diagnosis not present

## 2018-01-31 DIAGNOSIS — I509 Heart failure, unspecified: Secondary | ICD-10-CM | POA: Diagnosis not present

## 2018-01-31 DIAGNOSIS — I110 Hypertensive heart disease with heart failure: Secondary | ICD-10-CM | POA: Diagnosis not present

## 2018-01-31 DIAGNOSIS — I97648 Postprocedural seroma of a circulatory system organ or structure following other circulatory system procedure: Secondary | ICD-10-CM | POA: Diagnosis not present

## 2018-01-31 DIAGNOSIS — J441 Chronic obstructive pulmonary disease with (acute) exacerbation: Secondary | ICD-10-CM | POA: Diagnosis not present

## 2018-02-01 DIAGNOSIS — I5022 Chronic systolic (congestive) heart failure: Secondary | ICD-10-CM | POA: Diagnosis not present

## 2018-02-03 DIAGNOSIS — R200 Anesthesia of skin: Secondary | ICD-10-CM | POA: Diagnosis not present

## 2018-02-03 DIAGNOSIS — M5000 Cervical disc disorder with myelopathy, unspecified cervical region: Secondary | ICD-10-CM | POA: Diagnosis not present

## 2018-02-03 DIAGNOSIS — M5481 Occipital neuralgia: Secondary | ICD-10-CM | POA: Diagnosis not present

## 2018-02-03 DIAGNOSIS — I739 Peripheral vascular disease, unspecified: Secondary | ICD-10-CM | POA: Diagnosis not present

## 2018-02-04 ENCOUNTER — Ambulatory Visit (INDEPENDENT_AMBULATORY_CARE_PROVIDER_SITE_OTHER): Payer: Medicare HMO | Admitting: Vascular Surgery

## 2018-02-04 ENCOUNTER — Encounter (INDEPENDENT_AMBULATORY_CARE_PROVIDER_SITE_OTHER): Payer: Self-pay | Admitting: Vascular Surgery

## 2018-02-04 VITALS — BP 126/73 | HR 81 | Resp 16 | Ht 71.0 in | Wt 136.0 lb

## 2018-02-04 DIAGNOSIS — I739 Peripheral vascular disease, unspecified: Secondary | ICD-10-CM

## 2018-02-04 DIAGNOSIS — L7634 Postprocedural seroma of skin and subcutaneous tissue following other procedure: Secondary | ICD-10-CM | POA: Insufficient documentation

## 2018-02-04 DIAGNOSIS — Z89611 Acquired absence of right leg above knee: Secondary | ICD-10-CM

## 2018-02-04 NOTE — Assessment & Plan Note (Signed)
His seroma has recurred.  It is not particularly symptomatic.  At this point, I would just leave it alone unless it becomes more bothersome and then we would drain it again.

## 2018-02-04 NOTE — Progress Notes (Signed)
Patient ID: Tyler [name], male   DOB: 1956-02-16, 62 y.o.   MRN: [id number]  Chief Complaint  Patient presents with  . Follow-up    [facility] 3week wound check    HPI Tyler [name] is a 62 y.o. male.  Patient returns in follow-up.  He has done well after a seroma drainage and things were going well until earlier this week.  The seroma has reaccumulated despite the use of the VAC over the last few days.  It is not painful.  There is no redness.  No fevers or chills.   Past Medical History:  Diagnosis Date  . AICD (automatic cardioverter/defibrillator) present   . Arthritis   . Atrial fibrillation ([place])   . Cervical spinal stenosis    with neuropathy  . CHF (congestive heart failure) ([place])   . COPD (chronic obstructive pulmonary disease) ([place])   . Coronary artery disease   . Depression   . Dyspnea   . GERD (gastroesophageal reflux disease)   . Hypertension   . Myocardial infarction ([place])   . Peripheral vascular disease ([place])   . Presence of permanent cardiac pacemaker     Past Surgical History:  Procedure Laterality Date  . ABOVE KNEE LEG AMPUTATION Right    after below the knee amputation   . APPLICATION OF WOUND VAC Left 01/12/2018   Procedure: APPLICATION OF WOUND VAC;  Surgeon: Algernon Huxley, MD;  Location: [facility] ORS;  Service: Vascular;  Laterality: Left;  . BELOW KNEE LEG AMPUTATION Right   . CATARACT EXTRACTION, BILATERAL Bilateral   . ENDARTERECTOMY FEMORAL Left 08/11/2017   Procedure: ENDARTERECTOMY FEMORAL;  Surgeon: Algernon Huxley, MD;  Location: [facility] ORS;  Service: Vascular;  Laterality: Left;  . HEMATOMA EVACUATION Left 01/12/2018   Procedure: EVACUATION HEMATOMA ( DRAINING OF SEROMA);  Surgeon: Algernon Huxley, MD;  Location: [facility]S;  Service: Vascular;  Laterality: Left;  . IMPLANTABLE CARDIOVERTER DEFIBRILLATOR (ICD) GENERATOR CHANGE Left 02/10/2017   Procedure: ICD GENERATOR CHANGE;  Surgeon: Isaias Cowman, MD;  Location: [facility] ORS;  Service:  Cardiovascular;  Laterality: Left;  . INSERT / REPLACE / REMOVE PACEMAKER    . LOWER EXTREMITY ANGIOGRAPHY Left 06/07/2017   Procedure: LOWER EXTREMITY ANGIOGRAPHY;  Surgeon: Algernon Huxley, MD;  Location: [place] [facility];  Service: Cardiovascular;  Laterality: Left;  . LOWER EXTREMITY INTERVENTION  06/07/2017   Procedure: LOWER EXTREMITY INTERVENTION;  Surgeon: Algernon Huxley, MD;  Location: [place] [facility];  Service: Cardiovascular;;  . PERIPHERAL VASCULAR CATHETERIZATION Left 12/09/2015   Procedure: Lower Extremity Angiography;  Surgeon: Algernon Huxley, MD;  Location: [place] [facility];  Service: Cardiovascular;  Laterality: Left;  . PERIPHERAL VASCULAR CATHETERIZATION  12/09/2015   Procedure: Lower Extremity Intervention;  Surgeon: Algernon Huxley, MD;  Location: [place] [facility];  Service: Cardiovascular;;  . TONSILLECTOMY        Allergies  Allergen Reactions  . Apixaban Rash  . Rivaroxaban Rash    Current Outpatient Medications  Medication Sig Dispense Refill  . acetaminophen (TYLENOL) 500 MG tablet Take 1,000 mg by mouth daily as needed for moderate pain or headache.    . ALPRAZolam (XANAX) 1 MG tablet Take 1 mg by mouth 2 (two) times daily.     Marland Kitchen aspirin EC 81 MG tablet Take 1 tablet (81 mg total) by mouth daily. (Patient taking differently: Take 81 mg by mouth daily after supper. ) 150 tablet 2  . citalopram (CELEXA) 20 MG  tablet Take 20 mg by mouth at bedtime.   11  . docusate sodium (COLACE) 100 MG capsule Take 100 mg by mouth at bedtime as needed (for constipation.).     Marland Kitchen fluticasone (FLONASE) 50 MCG/ACT nasal spray Place 2 sprays into both nostrils daily.    . fluticasone-salmeterol (ADVAIR HFA) 115-21 MCG/ACT inhaler USE 2 INHALATIONS ORALLY EVERY 12 HOURS 36 Inhaler 1  . furosemide (LASIX) 20 MG tablet Take 20 mg by mouth every other day as needed (for fluid retention.).     Marland Kitchen guaiFENesin (MUCINEX) 600 MG 12 hr tablet Take 600 mg by mouth 2 (two) times daily.    Marland Kitchen  ipratropium (ATROVENT) 0.03 % nasal spray Place 2 sprays into both nostrils 2 (two) times daily.     . Ipratropium-Albuterol (COMBIVENT RESPIMAT) 20-100 MCG/ACT AERS respimat Inhale 1 puff into the lungs every 4 (four) hours.     Marland Kitchen levalbuterol (XOPENEX) 1.25 MG/3ML nebulizer solution Take 1.25 mg (3 mLs total) by nebulization every 4 (four) hours. (Patient taking differently: Take 3 mLs by nebulization every 4 (four) hours as needed for wheezing or shortness of breath ((typically twice daily)). ) 72 mL 12  . loratadine (CLARITIN) 10 MG tablet Take 10 mg by mouth at bedtime.    . lovastatin (MEVACOR) 20 MG tablet Take 20 mg by mouth at bedtime.    . metoprolol succinate (TOPROL-XL) 25 MG 24 hr tablet Take 25 mg by mouth daily.    . nitroGLYCERIN (NITROSTAT) 0.4 MG SL tablet Place 0.4 mg under the tongue every 5 (five) minutes as needed for chest pain.    Marland Kitchen nystatin ointment (MYCOSTATIN) Apply 1 application topically daily as needed (rash). Applied to corners of mouth  0  . oxyCODONE-acetaminophen (PERCOCET) 7.5-325 MG tablet Take 1 tablet by mouth every 4 (four) hours as needed for severe pain. 30 tablet 0  . pantoprazole (PROTONIX) 40 MG tablet Take 40 mg by mouth daily before breakfast.     . ramipril (ALTACE) 10 MG capsule Take 10 mg by mouth at bedtime.     . Tiotropium Bromide Monohydrate (SPIRIVA RESPIMAT) 1.25 MCG/ACT AERS Inhale 2 puffs into the lungs daily. 12 g 2  . vitamin B-12 (CYANOCOBALAMIN) 1000 MCG tablet Take 1,000 mcg by mouth daily.    Marland Kitchen warfarin (COUMADIN) 5 MG tablet Take 5 mg by mouth at bedtime.      No current facility-administered medications for this visit.         Physical Exam BP 126/73 (BP Location: Left Arm)   Pulse 81   Resp 16   Ht 5\' 11"  (1.803 m)   Wt 136 lb (61.7 kg)   BMI 18.97 kg/m  Gen:  WD/WN, NAD Skin: incision C/D/I.  Moderate sized seroma in the left femoral region which is reaccumulated.     Assessment/Plan:  Hx of AKA (above knee  amputation), right (HCC) Remote and well healed  PVD (peripheral vascular disease) ([place]) Recheck ABIs in a month or two  Postoperative seroma of subcutaneous tissue after non-dermatologic procedure His seroma has recurred.  It is not particularly symptomatic.  At this point, I would just leave it alone unless it becomes more bothersome and then we would drain it again.      Leotis Pain 02/04/2018, 3:58 PM   This note was created with Dragon medical transcription system.  Any errors from dictation are unintentional.

## 2018-02-04 NOTE — Assessment & Plan Note (Signed)
Recheck ABIs in a month or two

## 2018-02-04 NOTE — Assessment & Plan Note (Signed)
Remote and well-healed °

## 2018-02-05 ENCOUNTER — Other Ambulatory Visit: Payer: Self-pay | Admitting: Internal Medicine

## 2018-02-08 DIAGNOSIS — L89152 Pressure ulcer of sacral region, stage 2: Secondary | ICD-10-CM | POA: Diagnosis not present

## 2018-02-08 DIAGNOSIS — I739 Peripheral vascular disease, unspecified: Secondary | ICD-10-CM | POA: Diagnosis not present

## 2018-02-08 DIAGNOSIS — I97648 Postprocedural seroma of a circulatory system organ or structure following other circulatory system procedure: Secondary | ICD-10-CM | POA: Diagnosis not present

## 2018-02-08 DIAGNOSIS — G629 Polyneuropathy, unspecified: Secondary | ICD-10-CM | POA: Diagnosis not present

## 2018-02-08 DIAGNOSIS — J441 Chronic obstructive pulmonary disease with (acute) exacerbation: Secondary | ICD-10-CM | POA: Diagnosis not present

## 2018-02-08 DIAGNOSIS — I4891 Unspecified atrial fibrillation: Secondary | ICD-10-CM | POA: Diagnosis not present

## 2018-02-08 DIAGNOSIS — I509 Heart failure, unspecified: Secondary | ICD-10-CM | POA: Diagnosis not present

## 2018-02-08 DIAGNOSIS — I110 Hypertensive heart disease with heart failure: Secondary | ICD-10-CM | POA: Diagnosis not present

## 2018-02-08 DIAGNOSIS — I2510 Atherosclerotic heart disease of native coronary artery without angina pectoris: Secondary | ICD-10-CM | POA: Diagnosis not present

## 2018-02-17 DIAGNOSIS — I482 Chronic atrial fibrillation: Secondary | ICD-10-CM | POA: Diagnosis not present

## 2018-02-18 ENCOUNTER — Telehealth (INDEPENDENT_AMBULATORY_CARE_PROVIDER_SITE_OTHER): Payer: Self-pay

## 2018-02-18 NOTE — Telephone Encounter (Signed)
Patient called wanting to know if his seroma can be drained off in the office and how soon can that be done.

## 2018-02-21 DIAGNOSIS — R200 Anesthesia of skin: Secondary | ICD-10-CM | POA: Diagnosis not present

## 2018-02-21 NOTE — Telephone Encounter (Signed)
Patient will be scheduled to come in on 02/22/18.

## 2018-02-22 DIAGNOSIS — J441 Chronic obstructive pulmonary disease with (acute) exacerbation: Secondary | ICD-10-CM | POA: Diagnosis not present

## 2018-02-25 ENCOUNTER — Encounter (INDEPENDENT_AMBULATORY_CARE_PROVIDER_SITE_OTHER): Payer: Self-pay | Admitting: Vascular Surgery

## 2018-02-25 ENCOUNTER — Ambulatory Visit (INDEPENDENT_AMBULATORY_CARE_PROVIDER_SITE_OTHER): Payer: Medicare HMO | Admitting: Vascular Surgery

## 2018-02-25 VITALS — BP 134/77 | HR 76 | Resp 18 | Ht 72.0 in | Wt 134.0 lb

## 2018-02-25 DIAGNOSIS — Z89611 Acquired absence of right leg above knee: Secondary | ICD-10-CM

## 2018-02-25 DIAGNOSIS — I70229 Atherosclerosis of native arteries of extremities with rest pain, unspecified extremity: Secondary | ICD-10-CM

## 2018-02-25 DIAGNOSIS — J449 Chronic obstructive pulmonary disease, unspecified: Secondary | ICD-10-CM | POA: Diagnosis not present

## 2018-02-25 DIAGNOSIS — L7634 Postprocedural seroma of skin and subcutaneous tissue following other procedure: Secondary | ICD-10-CM | POA: Diagnosis not present

## 2018-02-25 NOTE — Assessment & Plan Note (Signed)
Symptoms markedly improved after revascularization.  Now with issues with chronic seroma of the left groin.  To have his perfusion checked in about 2 to 3 weeks.

## 2018-02-25 NOTE — Progress Notes (Signed)
Patient ID: Tyler [name], male   DOB: 1956/03/19, 62 y.o.   MRN: [id number]  Chief Complaint  Patient presents with  . Follow-up    Aspiration of Seroma    HPI Tyler [name] is a 62 y.o. male.  Patient follows up today to have his left groin seroma aspirated.  He says it went down several days ago but has recurred.  No fever or chills.   Past Medical History:  Diagnosis Date  . AICD (automatic cardioverter/defibrillator) present   . Arthritis   . Atrial fibrillation ([place])   . Cervical spinal stenosis    with neuropathy  . CHF (congestive heart failure) ([place])   . COPD (chronic obstructive pulmonary disease) ([place])   . Coronary artery disease   . Depression   . Dyspnea   . GERD (gastroesophageal reflux disease)   . Hypertension   . Myocardial infarction ([place])   . Peripheral vascular disease ([place])   . Presence of permanent cardiac pacemaker     Past Surgical History:  Procedure Laterality Date  . ABOVE KNEE LEG AMPUTATION Right    after below the knee amputation   . APPLICATION OF WOUND VAC Left 01/12/2018   Procedure: APPLICATION OF WOUND VAC;  Surgeon: Algernon Huxley, MD;  Location: [facility] ORS;  Service: Vascular;  Laterality: Left;  . BELOW KNEE LEG AMPUTATION Right   . CATARACT EXTRACTION, BILATERAL Bilateral   . ENDARTERECTOMY FEMORAL Left 08/11/2017   Procedure: ENDARTERECTOMY FEMORAL;  Surgeon: Algernon Huxley, MD;  Location: [facility];  Service: Vascular;  Laterality: Left;  . HEMATOMA EVACUATION Left 01/12/2018   Procedure: EVACUATION HEMATOMA ( DRAINING OF SEROMA);  Surgeon: Algernon Huxley, MD;  Location: [facility];  Service: Vascular;  Laterality: Left;  . IMPLANTABLE CARDIOVERTER DEFIBRILLATOR (ICD) GENERATOR CHANGE Left 02/10/2017   Procedure: ICD GENERATOR CHANGE;  Surgeon: Isaias Cowman, MD;  Location: [facility];  Service: Cardiovascular;  Laterality: Left;  . INSERT / REPLACE / REMOVE PACEMAKER    . LOWER EXTREMITY ANGIOGRAPHY Left 06/07/2017   Procedure:  LOWER EXTREMITY ANGIOGRAPHY;  Surgeon: Algernon Huxley, MD;  Location: [place] [facility];  Service: Cardiovascular;  Laterality: Left;  . LOWER EXTREMITY INTERVENTION  06/07/2017   Procedure: LOWER EXTREMITY INTERVENTION;  Surgeon: Algernon Huxley, MD;  Location: [place] [facility];  Service: Cardiovascular;;  . PERIPHERAL VASCULAR CATHETERIZATION Left 12/09/2015   Procedure: Lower Extremity Angiography;  Surgeon: Algernon Huxley, MD;  Location: [place] [facility];  Service: Cardiovascular;  Laterality: Left;  . PERIPHERAL VASCULAR CATHETERIZATION  12/09/2015   Procedure: Lower Extremity Intervention;  Surgeon: Algernon Huxley, MD;  Location: [place] [facility];  Service: Cardiovascular;;  . TONSILLECTOMY        Allergies  Allergen Reactions  . Apixaban Rash  . Rivaroxaban Rash    Current Outpatient Medications  Medication Sig Dispense Refill  . acetaminophen (TYLENOL) 500 MG tablet Take 1,000 mg by mouth daily as needed for moderate pain or headache.    . ALPRAZolam (XANAX) 1 MG tablet Take 1 mg by mouth 2 (two) times daily.     Marland Kitchen aspirin EC 81 MG tablet Take 1 tablet (81 mg total) by mouth daily. (Patient taking differently: Take 81 mg by mouth daily after supper. ) 150 tablet 2  . citalopram (CELEXA) 20 MG tablet Take 20 mg by mouth at bedtime.   11  . docusate sodium (COLACE) 100 MG capsule Take 100 mg by mouth at bedtime  as needed (for constipation.).     Marland Kitchen fluticasone (FLONASE) 50 MCG/ACT nasal spray Place 2 sprays into both nostrils daily.    . fluticasone-salmeterol (ADVAIR HFA) 115-21 MCG/ACT inhaler USE 2 INHALATIONS ORALLY EVERY 12 HOURS 36 Inhaler 1  . furosemide (LASIX) 20 MG tablet Take 20 mg by mouth every other day as needed (for fluid retention.).     Marland Kitchen guaiFENesin (MUCINEX) 600 MG 12 hr tablet Take 600 mg by mouth 2 (two) times daily.    Marland Kitchen ipratropium (ATROVENT) 0.03 % nasal spray Place 2 sprays into both nostrils 2 (two) times daily.     . Ipratropium-Albuterol (COMBIVENT  RESPIMAT) 20-100 MCG/ACT AERS respimat Inhale 1 puff into the lungs every 4 (four) hours.     Marland Kitchen levalbuterol (XOPENEX) 1.25 MG/3ML nebulizer solution Take 1.25 mg (3 mLs total) by nebulization every 4 (four) hours. (Patient taking differently: Take 3 mLs by nebulization every 4 (four) hours as needed for wheezing or shortness of breath ((typically twice daily)). ) 72 mL 12  . loratadine (CLARITIN) 10 MG tablet Take 10 mg by mouth at bedtime.    . lovastatin (MEVACOR) 20 MG tablet Take 20 mg by mouth at bedtime.    . metoprolol succinate (TOPROL-XL) 25 MG 24 hr tablet Take 25 mg by mouth daily.    . nitroGLYCERIN (NITROSTAT) 0.4 MG SL tablet Place 0.4 mg under the tongue every 5 (five) minutes as needed for chest pain.    Marland Kitchen nystatin cream (MYCOSTATIN) APPLY TO AFFECTED AREA TWICE A DAY  0  . oxyCODONE-acetaminophen (PERCOCET) 7.5-325 MG tablet Take 1 tablet by mouth every 4 (four) hours as needed for severe pain. 30 tablet 0  . pantoprazole (PROTONIX) 40 MG tablet Take 40 mg by mouth daily before breakfast.     . ramipril (ALTACE) 10 MG capsule Take 10 mg by mouth at bedtime.     Marland Kitchen SPIRIVA RESPIMAT 1.25 MCG/ACT AERS INHALE 2 PUFFS INTO THE LUNGS DAILY. 1 Inhaler 2  . vitamin B-12 (CYANOCOBALAMIN) 1000 MCG tablet Take 1,000 mcg by mouth daily.    Marland Kitchen warfarin (COUMADIN) 5 MG tablet Take 5 mg by mouth at bedtime.     . Wound Dressings (RESTORE WOUND CARE DRESSING) PADS Apply topically.     No current facility-administered medications for this visit.         Physical Exam BP 134/77 (BP Location: Left Arm, Patient Position: Sitting)   Pulse 76   Resp 18   Ht 6' (1.829 m)   Wt 134 lb (60.8 kg)   BMI 18.17 kg/m  Gen:  WD/WN, NAD Skin: incision C/D/I.  Moderate sized seroma in the left groin.     Assessment/Plan:  Hx of AKA (above knee amputation), right ([place]) Remote and well healed.  COPD (chronic obstructive pulmonary disease) (HCC) Severe.  On continuous oxygen  Atherosclerosis  of artery of extremity with rest pain (HCC) Symptoms markedly improved after revascularization.  Now with issues with chronic seroma of the left groin.  To have his perfusion checked in about 2 to 3 weeks.  Postoperative seroma of subcutaneous tissue after non-dermatologic procedure Recurrent even after surgical drainage.  I used an 18-gauge needle after cleansing the area today and aspirated about 75 cc of straw-colored fluid.  A sterile dressing was placed.      Leotis Pain 02/25/2018, 2:29 PM   This note was created with Dragon medical transcription system.  Any errors from dictation are unintentional.

## 2018-02-25 NOTE — Assessment & Plan Note (Signed)
Recurrent even after surgical drainage.  I used an 18-gauge needle after cleansing the area today and aspirated about 75 cc of straw-colored fluid.  A sterile dressing was placed.

## 2018-02-25 NOTE — Assessment & Plan Note (Signed)
Severe.  On continuous oxygen

## 2018-02-25 NOTE — Assessment & Plan Note (Signed)
Remote and well-healed °

## 2018-03-04 ENCOUNTER — Ambulatory Visit
Admission: RE | Admit: 2018-03-04 | Discharge: 2018-03-04 | Disposition: A | Discharge status: Home / Self Care | Payer: Medicare HMO | Source: Ambulatory Visit | Attending: Internal Medicine | Admitting: Internal Medicine

## 2018-03-04 DIAGNOSIS — R0602 Shortness of breath: Secondary | ICD-10-CM | POA: Diagnosis not present

## 2018-03-04 DIAGNOSIS — I2510 Atherosclerotic heart disease of native coronary artery without angina pectoris: Secondary | ICD-10-CM | POA: Insufficient documentation

## 2018-03-04 DIAGNOSIS — R918 Other nonspecific abnormal finding of lung field: Secondary | ICD-10-CM | POA: Diagnosis not present

## 2018-03-04 DIAGNOSIS — I700 Atherosclerosis of aorta: Secondary | ICD-10-CM | POA: Insufficient documentation

## 2018-03-04 DIAGNOSIS — J439 Emphysema, unspecified: Secondary | ICD-10-CM | POA: Insufficient documentation

## 2018-03-11 ENCOUNTER — Ambulatory Visit (INDEPENDENT_AMBULATORY_CARE_PROVIDER_SITE_OTHER): Payer: Medicare HMO

## 2018-03-11 ENCOUNTER — Ambulatory Visit (INDEPENDENT_AMBULATORY_CARE_PROVIDER_SITE_OTHER): Payer: Medicare HMO | Admitting: Vascular Surgery

## 2018-03-11 ENCOUNTER — Encounter (INDEPENDENT_AMBULATORY_CARE_PROVIDER_SITE_OTHER): Payer: Self-pay | Admitting: Vascular Surgery

## 2018-03-11 VITALS — BP 122/80 | HR 81 | Resp 15 | Ht 69.0 in | Wt 147.0 lb

## 2018-03-11 DIAGNOSIS — L7634 Postprocedural seroma of skin and subcutaneous tissue following other procedure: Secondary | ICD-10-CM | POA: Diagnosis not present

## 2018-03-11 DIAGNOSIS — I739 Peripheral vascular disease, unspecified: Secondary | ICD-10-CM | POA: Diagnosis not present

## 2018-03-11 DIAGNOSIS — Z89611 Acquired absence of right leg above knee: Secondary | ICD-10-CM | POA: Diagnosis not present

## 2018-03-11 DIAGNOSIS — I70219 Atherosclerosis of native arteries of extremities with intermittent claudication, unspecified extremity: Secondary | ICD-10-CM | POA: Diagnosis not present

## 2018-03-11 DIAGNOSIS — J449 Chronic obstructive pulmonary disease, unspecified: Secondary | ICD-10-CM

## 2018-03-11 DIAGNOSIS — Z87891 Personal history of nicotine dependence: Secondary | ICD-10-CM | POA: Diagnosis not present

## 2018-03-11 NOTE — Assessment & Plan Note (Signed)
ABIs today are noncompressible, but his waveforms are fairly good.  No current limb threatening symptoms.  We will recheck ABIs in 6 months.

## 2018-03-11 NOTE — Progress Notes (Signed)
MRN : [id number]  Tyler [name] is a 62 y.o. (17-Jun-1955) male who presents with chief complaint of  Chief Complaint  Patient presents with  . Follow-up    1 month ABI follow up  .  History of Present Illness: Patient returns today in follow up of PAD and his left groin seroma.  His seroma recurred after aspiration at his last visit.  It is not red.  It is not overtly painful.  No fevers or chills.  No signs of systemic infection. No new ulcerations or infections on his legs.  Current Outpatient Medications  Medication Sig Dispense Refill  . acetaminophen (TYLENOL) 500 MG tablet Take 1,000 mg by mouth daily as needed for moderate pain or headache.    . ALPRAZolam (XANAX) 1 MG tablet Take 1 mg by mouth 2 (two) times daily.     Marland Kitchen aspirin EC 81 MG tablet Take 1 tablet (81 mg total) by mouth daily. (Patient taking differently: Take 81 mg by mouth daily after supper. ) 150 tablet 2  . citalopram (CELEXA) 20 MG tablet Take 20 mg by mouth at bedtime.   11  . docusate sodium (COLACE) 100 MG capsule Take 100 mg by mouth at bedtime as needed (for constipation.).     Marland Kitchen fluticasone (FLONASE) 50 MCG/ACT nasal spray Place 2 sprays into both nostrils daily.    . fluticasone-salmeterol (ADVAIR HFA) 115-21 MCG/ACT inhaler USE 2 INHALATIONS ORALLY EVERY 12 HOURS 36 Inhaler 1  . furosemide (LASIX) 20 MG tablet Take 20 mg by mouth every other day as needed (for fluid retention.).     Marland Kitchen guaiFENesin (MUCINEX) 600 MG 12 hr tablet Take 600 mg by mouth 2 (two) times daily.    Marland Kitchen ipratropium (ATROVENT) 0.03 % nasal spray Place 2 sprays into both nostrils 2 (two) times daily.     . Ipratropium-Albuterol (COMBIVENT RESPIMAT) 20-100 MCG/ACT AERS respimat Inhale 1 puff into the lungs every 4 (four) hours.     Marland Kitchen levalbuterol (XOPENEX) 1.25 MG/3ML nebulizer solution Take 1.25 mg (3 mLs total) by nebulization every 4 (four) hours. (Patient taking differently: Take 3 mLs by nebulization every 4 (four) hours as needed  for wheezing or shortness of breath ((typically twice daily)). ) 72 mL 12  . loratadine (CLARITIN) 10 MG tablet Take 10 mg by mouth at bedtime.    . lovastatin (MEVACOR) 20 MG tablet Take 20 mg by mouth at bedtime.    . metoprolol succinate (TOPROL-XL) 25 MG 24 hr tablet Take 25 mg by mouth daily.    . nitroGLYCERIN (NITROSTAT) 0.4 MG SL tablet Place 0.4 mg under the tongue every 5 (five) minutes as needed for chest pain.    Marland Kitchen nystatin cream (MYCOSTATIN) APPLY TO AFFECTED AREA TWICE A DAY  0  . oxyCODONE-acetaminophen (PERCOCET) 7.5-325 MG tablet Take 1 tablet by mouth every 4 (four) hours as needed for severe pain. 30 tablet 0  . pantoprazole (PROTONIX) 40 MG tablet Take 40 mg by mouth daily before breakfast.     . ramipril (ALTACE) 10 MG capsule Take 10 mg by mouth at bedtime.     Marland Kitchen SPIRIVA RESPIMAT 1.25 MCG/ACT AERS INHALE 2 PUFFS INTO THE LUNGS DAILY. 1 Inhaler 2  . tamsulosin (FLOMAX) 0.4 MG CAPS capsule TAKE 1 CAPSULE (0.4 MG TOTAL) BY MOUTH ONCE DAILY TAKE 30 MINUTES AFTER SAME MEAL EACH DAY.  11  . vitamin B-12 (CYANOCOBALAMIN) 1000 MCG tablet Take 1,000 mcg by mouth daily.    Marland Kitchen warfarin (  COUMADIN) 5 MG tablet Take 5 mg by mouth at bedtime.     . Wound Dressings (RESTORE WOUND CARE DRESSING) PADS Apply topically.     No current facility-administered medications for this visit.     Past Medical History:  Diagnosis Date  . AICD (automatic cardioverter/defibrillator) present   . Arthritis   . Atrial fibrillation ([place])   . Cervical spinal stenosis    with neuropathy  . CHF (congestive heart failure) ([place])   . COPD (chronic obstructive pulmonary disease) ([place])   . Coronary artery disease   . Depression   . Dyspnea   . GERD (gastroesophageal reflux disease)   . Hypertension   . Myocardial infarction ([place])   . Peripheral vascular disease ([place])   . Presence of permanent cardiac pacemaker     Past Surgical History:  Procedure Laterality Date  . ABOVE KNEE LEG AMPUTATION Right      after below the knee amputation   . APPLICATION OF WOUND VAC Left 01/12/2018   Procedure: APPLICATION OF WOUND VAC;  Surgeon: Algernon Huxley, MD;  Location: [facility];  Service: Vascular;  Laterality: Left;  . BELOW KNEE LEG AMPUTATION Right   . CATARACT EXTRACTION, BILATERAL Bilateral   . ENDARTERECTOMY FEMORAL Left 08/11/2017   Procedure: ENDARTERECTOMY FEMORAL;  Surgeon: Algernon Huxley, MD;  Location: [facility];  Service: Vascular;  Laterality: Left;  . HEMATOMA EVACUATION Left 01/12/2018   Procedure: EVACUATION HEMATOMA ( DRAINING OF SEROMA);  Surgeon: Algernon Huxley, MD;  Location: [facility];  Service: Vascular;  Laterality: Left;  . IMPLANTABLE CARDIOVERTER DEFIBRILLATOR (ICD) GENERATOR CHANGE Left 02/10/2017   Procedure: ICD GENERATOR CHANGE;  Surgeon: Isaias Cowman, MD;  Location: [facility];  Service: Cardiovascular;  Laterality: Left;  . INSERT / REPLACE / REMOVE PACEMAKER    . LOWER EXTREMITY ANGIOGRAPHY Left 06/07/2017   Procedure: LOWER EXTREMITY ANGIOGRAPHY;  Surgeon: Algernon Huxley, MD;  Location: [place] [facility];  Service: Cardiovascular;  Laterality: Left;  . LOWER EXTREMITY INTERVENTION  06/07/2017   Procedure: LOWER EXTREMITY INTERVENTION;  Surgeon: Algernon Huxley, MD;  Location: [place] [facility];  Service: Cardiovascular;;  . PERIPHERAL VASCULAR CATHETERIZATION Left 12/09/2015   Procedure: Lower Extremity Angiography;  Surgeon: Algernon Huxley, MD;  Location: [place] [facility];  Service: Cardiovascular;  Laterality: Left;  . PERIPHERAL VASCULAR CATHETERIZATION  12/09/2015   Procedure: Lower Extremity Intervention;  Surgeon: Algernon Huxley, MD;  Location: [place] [facility];  Service: Cardiovascular;;  . TONSILLECTOMY      Social History        Tobacco Use  . Smoking status: Former Smoker    Packs/day: 1.00    Years: 42.00    Pack years: 42.00    Types: Cigarettes    Last attempt to quit: 09/23/2013    Years since quitting: 4.1  . Smokeless tobacco: Never  Used  Substance Use Topics  . Alcohol use: No  . Drug use: No    Family History      Family History  Problem Relation Age of Onset  . Cancer Mother   . Cancer Father   . Heart disease Father          Allergies  Allergen Reactions  . Apixaban Rash  . Rivaroxaban Rash     REVIEW OF SYSTEMS (Negative unless checked)  Constitutional: '[]' Weight loss  '[]' Fever  '[]' Chills Cardiac: '[]' Chest pain   '[]' Chest pressure   '[x]' Palpitations   '[]' Shortness of breath when laying flat   '[]'   Shortness of breath at rest   '[x]' Shortness of breath with exertion. Vascular:  '[]' Pain in legs with walking   '[]' Pain in legs at rest   '[]' Pain in legs when laying flat   '[]' Claudication   '[]' Pain in feet when walking  '[]' Pain in feet at rest  '[]' Pain in feet when laying flat   '[]' History of DVT   '[]' Phlebitis   '[x]' Swelling in legs   '[]' Varicose veins   '[]' Non-healing ulcers Pulmonary:   '[]' Uses home oxygen   '[]' Productive cough   '[]' Hemoptysis   '[]' Wheeze  '[x]' COPD   '[]' Asthma Neurologic:  '[]' Dizziness  '[]' Blackouts   '[]' Seizures   '[]' History of stroke   '[]' History of TIA  '[]' Aphasia   '[]' Temporary blindness   '[]' Dysphagia   '[]' Weakness or numbness in arms   '[]' Weakness or numbness in legs Musculoskeletal:  '[x]' Arthritis   '[]' Joint swelling   '[x]' Joint pain   '[]' Low back pain Hematologic:  '[]' Easy bruising  '[]' Easy bleeding   '[]' Hypercoagulable state   '[]' Anemic   Gastrointestinal:  '[]' Blood in stool   '[]' Vomiting blood  '[]' Gastroesophageal reflux/heartburn   '[]' Abdominal pain Genitourinary:  '[]' Chronic kidney disease   '[]' Difficult urination  '[]' Frequent urination  '[]' Burning with urination   '[]' Hematuria Skin:  '[]' Rashes   '[]' Ulcers   '[]' Wounds Psychological:  '[]' History of anxiety   '[]'  History of major depression.    Physical Examination  BP 122/80 (BP Location: Left Arm, Patient Position: Sitting)   Pulse 81   Resp 15   Ht '5\' 9"'  (1.753 m)   Wt 147 lb (66.7 kg)   BMI 21.71 kg/m  Gen:  WD/WN, NAD Head: [place]/AT, No temporalis  wasting. Ear/Nose/Throat: Hearing grossly intact, nares w/o erythema or drainage Eyes: Conjunctiva clear. Sclera non-icteric Neck: Supple.  Trachea midline Pulmonary:  Good air movement, respirations not labored on supplemental oxygen Cardiac: RRR, no JVD Vascular:  Vessel Right Left  Radial Palpable Palpable                                   Musculoskeletal: M/S 5/5 throughout.  Right AKA well-healed.  Moderate size left thigh and groin seroma just below the previous surgical incision. Neurologic: Sensation grossly intact in extremities.  Symmetrical.  Speech is fluent.  Psychiatric: Judgment intact, Mood & affect appropriate for pt's clinical situation. Dermatologic: No rashes or ulcers noted.  No cellulitis or open wounds.       Labs Recent Results (from the past 2160 hour(s))  Basic metabolic panel     Status: Abnormal   Collection Time: 01/06/18  1:16 PM  Result Value Ref Range   Sodium 133 (L) 135 - 145 mmol/L   Potassium 4.1 3.5 - 5.1 mmol/L   Chloride 97 (L) 98 - 111 mmol/L   CO2 29 22 - 32 mmol/L   Glucose, Bld 86 70 - 99 mg/dL   BUN 10 8 - 23 mg/dL   Creatinine, Ser 0.74 0.61 - 1.24 mg/dL   Calcium 9.3 8.9 - 10.3 mg/dL   GFR calc non Af Amer >60 >60 mL/min   GFR calc Af Amer >60 >60 mL/min    Comment: (NOTE) The eGFR has been calculated using the CKD EPI equation. This calculation has not been validated in all clinical situations. eGFR's persistently <60 mL/min signify possible Chronic Kidney Disease.    Anion gap 7 5 - 15    Comment: Performed at [facility], [address] [place],  [address]  CBC WITH DIFFERENTIAL     Status: Abnormal   Collection Time: 01/06/18  1:16 PM  Result Value Ref Range   WBC 9.7 3.8 - 10.6 K/uL   RBC 4.47 4.40 - 5.90 MIL/uL   Hemoglobin 13.3 13.0 - 18.0 g/dL   HCT 38.9 (L) 40.0 - 52.0 %   MCV 87.1 80.0 - 100.0 fL   MCH 29.8 26.0 - 34.0 pg   MCHC 34.2 32.0 - 36.0 g/dL   RDW 15.1 (H) 11.5 - 14.5 %    Platelets 252 150 - 440 K/uL   Neutrophils Relative % 78 %   Neutro Abs 7.6 (H) 1.4 - 6.5 K/uL   Lymphocytes Relative 12 %   Lymphs Abs 1.2 1.0 - 3.6 K/uL   Monocytes Relative 8 %   Monocytes Absolute 0.8 0.2 - 1.0 K/uL   Eosinophils Relative 1 %   Eosinophils Absolute 0.1 0 - 0.7 K/uL   Basophils Relative 1 %   Basophils Absolute 0.1 0 - 0.1 K/uL    Comment: Performed at [facility], [address] [place], [place] [address]  Type and screen     Status: None   Collection Time: 01/06/18  1:16 PM  Result Value Ref Range   ABO/RH(D) A POS    Antibody Screen NEG    Sample Expiration 01/20/2018    Extend sample reason      NO TRANSFUSIONS OR PREGNANCY IN THE PAST 3 MONTHS Performed at [facility], [address] [place], [place] [address]   APTT     Status: None   Collection Time: 01/12/18 11:34 AM  Result Value Ref Range   aPTT 31 24 - 36 seconds    Comment: Performed at [facility] [address] [place], [place] [address]  Protime-INR     Status: None   Collection Time: 01/12/18 11:34 AM  Result Value Ref Range   Prothrombin Time 15.0 11.4 - 15.2 seconds   INR 1.19     Comment: Performed at [facility], [address] [place], [place] [address]  Surgical pathology     Status: None   Collection Time: 01/12/18  1:44 PM  Result Value Ref Range   SURGICAL PATHOLOGY      Surgical Pathology CASE: [id number] PATIENT: Sherril Croon Surgical Pathology Report     SPECIMEN SUBMITTED: A. Seroma sac  CLINICAL HISTORY: None provided  PRE-OPERATIVE DIAGNOSIS: Hematoma  POST-OPERATIVE DIAGNOSIS: Same as pre-op     DIAGNOSIS: A. SEROMA SAC, LEFT THIGH; EXCISION: - FIBROADIPOSE TISSUE WITH MILD CHRONIC INFLAMMATION, CLINICALLY SEROMA SAC. - NEGATIVE FOR MALIGNANCY.   GROSS DESCRIPTION: A. Labeled: Seroma sac Received: Formalin Tissue fragment(s): 3 Size: Aggregate, 3.3 x 2.2 x 0.8 cm Description: Irregular  fragments of tan-pink, fibromembranous soft tissue with minimal attached adipose tissue.  No abnormalities or mass lesions are grossly identified. Representative sections are submitted in cassette 1.     Final Diagnosis performed by Quay Burow, MD.   Electronically signed 01/15/2018 10:50:58AM The electronic signature indicates that the named Attending Pathologist has evaluated the specimen  Technical component  performed at [facility], [address], [place], [place] [address] Lab: [phone] Dir: Rush Farmer, MD, MMM  Professional component performed at [facility], [facility], [address], [place], [place] [address] Lab: [phone] Dir: Dellia Nims. Reuel Derby, MD     Radiology Ct Chest Wo Contrast  Result Date: 03/05/2018 CLINICAL DATA:  Shortness of breath. Follow-up of prior CT. Follow-up of pulmonary nodule. On  home oxygen. No history of primary malignancy. No new complaints. EXAM: CT CHEST WITHOUT CONTRAST TECHNIQUE: Multidetector CT imaging of the chest was performed following the standard protocol without IV contrast. COMPARISON:  07/22/2017 FINDINGS: Cardiovascular: Pacer/AICD device. Advanced aortic and branch vessel atherosclerosis. Tortuous thoracic aorta. Mild cardiomegaly with trace pericardial fluid, likely physiologic. Multivessel coronary artery atherosclerosis. Mediastinum/Nodes: No mediastinal or definite hilar adenopathy, given limitations of unenhanced CT. Lungs/Pleura: No pleural fluid. Moderate centrilobular emphysema. Lower lobe predominant bronchial wall thickening. Probable secretions in the bronchus intermedius including on 83/3. The right upper lobe pulmonary nodule is similar, including at 4 mm on image 41/3. Right anterior upper lobe linear scarring. A 3 mm posterior right upper lobe pulmonary nodule on image 66/3 is unchanged. Resolved right lower lobe "tree-in-bud" nodularity. Mild linear scarring within the right lower lobe. Upper Abdomen: Old  granulomatous disease in the spleen. Normal imaged portions of the liver, stomach, gallbladder, adrenal glands, kidneys. Abdominal aortic atherosclerosis. Musculoskeletal: Probable sebaceous cyst about the posterior chest wall at 2.4 cm on image 52/2, similar. Mild osteopenia. Mild superior endplate compression deformity at T10, new since the prior. IMPRESSION: 1. Similar appearance (over 8 months) of right upper lobe pulmonary nodules, including the 4 mm nodule of concern on the prior. Given emphysema, recommend follow-up chest CT at 6-12 months. This recommendation follows the consensus statement: Guidelines for Management of Incidental Pulmonary Nodules Detected on CT Images:From the Fleischner Society 2017; published online before print (10.1148/radiol.4599774142). 2. Resolution of right lower lobe infectious or inflammatory nodularity. 3. Aortic atherosclerosis (ICD10-I70.0), coronary artery atherosclerosis and emphysema (ICD10-J43.9). 4. New mild T10 compression deformity since the prior CT. Electronically Signed   By: Abigail Miyamoto M.D.   On: 03/05/2018 10:31    Assessment/Plan Hx of AKA (above knee amputation), right (HCC) remote  COPD (chronic obstructive pulmonary disease) (HCC) On oxygen  PVD (peripheral vascular disease) (HCC) ABIs today are noncompressible, but his waveforms are fairly good.  No current limb threatening symptoms.  We will recheck ABIs in 6 months.  Postoperative seroma of subcutaneous tissue after non-dermatologic procedure His seroma continues to recur.  I have discussed this with the patient and told him I think it has a high likelihood of recurrence no matter which way we drain or aspirate this.  It has drained after surgical resection as well as as percutaneous drainage.  It is not infected.  I have recommended that we leave this alone and only aspirate or drain it if it becomes more symptomatic.    Leotis Pain, MD  03/11/2018 3:37 PM    This note was created  with Dragon medical transcription system.  Any errors from dictation are purely unintentional

## 2018-03-11 NOTE — Assessment & Plan Note (Signed)
His seroma continues to recur.  I have discussed this with the patient and told him I think it has a high likelihood of recurrence no matter which way we drain or aspirate this.  It has drained after surgical resection as well as as percutaneous drainage.  It is not infected.  I have recommended that we leave this alone and only aspirate or drain it if it becomes more symptomatic.

## 2018-03-15 ENCOUNTER — Ambulatory Visit: Payer: Medicare HMO | Admitting: Internal Medicine

## 2018-03-17 ENCOUNTER — Ambulatory Visit: Payer: Medicare HMO | Admitting: Internal Medicine

## 2018-03-21 DIAGNOSIS — I70219 Atherosclerosis of native arteries of extremities with intermittent claudication, unspecified extremity: Secondary | ICD-10-CM | POA: Diagnosis not present

## 2018-03-21 DIAGNOSIS — I482 Chronic atrial fibrillation, unspecified: Secondary | ICD-10-CM | POA: Diagnosis not present

## 2018-03-22 ENCOUNTER — Ambulatory Visit: Payer: Medicare HMO | Admitting: Internal Medicine

## 2018-03-22 ENCOUNTER — Encounter: Payer: Self-pay | Admitting: Internal Medicine

## 2018-03-22 VITALS — BP 132/82 | HR 79 | Ht 73.0 in | Wt 147.0 lb

## 2018-03-22 DIAGNOSIS — R918 Other nonspecific abnormal finding of lung field: Secondary | ICD-10-CM | POA: Diagnosis not present

## 2018-03-22 DIAGNOSIS — J441 Chronic obstructive pulmonary disease with (acute) exacerbation: Secondary | ICD-10-CM | POA: Diagnosis not present

## 2018-03-22 MED ORDER — PREDNISONE 20 MG PO TABS: 20.0000 mg | ORAL_TABLET | Freq: Every day | ORAL | 0 refills | Status: DC

## 2018-03-22 MED ORDER — AZITHROMYCIN 250 MG PO TABS: ORAL_TABLET | ORAL | 0 refills | Status: DC

## 2018-03-22 NOTE — Progress Notes (Signed)
Name: Tyler [name] MRN:   [id number] DOB:   [birth year]             CONSULTATION DATE:  08/02/2017   REFERRING MD :  Dr. Bridgett Larsson   CHIEF COMPLAINT: Shortness of Breath/COPD  BRIEF PATIENT DESCRIPTION:  62 yo old male admitted with 05/22 with acute on chronic hypercapnic hypoxic respiratory failure secondary to AECOPD   STUDIES:  CXR 10/21/16 I have Independently reviewed images of  cxr   on 08/02/2017 Interpretation: No evidence of opacifications no effusions   PATIENT PROFILE This is a 62 yo male with a PMH of Cardiac Pacemaker, PVD, HTN, Myocardial Infarction, CAD, COPD, Chronic Systolic CHF, Atrial Fibrillation, and AICD.    He presented to [facility] ER 05/22 with c/o worsening shortness of breath with chest COPD exacerbation Hosp course-Steroids, ABX and BD therapy patient discharged home with oxygen  Patient saw Dr. Maylene Roes at [facility] for management of his COPD PFTs reports show severe obstructive airway disease with air trapping and reduced DLCO  Outpatient inhaler therapy includes Advair Spiriva and albuterol as needed Patient was diagnosed with COPD many years ago he was told he has stage IV Patient was referred to [facility] lung transplant services and was denied next line patient is a former smoker a pack a day for approximately 45 years Quit 3 years ago 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  Mother died of lung cancer Father died of kidney cancer    CC follow up COPD  HPI  +signs of exacerbation Increased sputum production Chronic SOB and DOE Uses oxygen 3L [place] continuously  Has chest congestion Slight increased WOB           Review of Systems:  Gen:  Denies  fever, sweats, chills weigh loss  HEENT: Denies blurred vision, double vision, ear pain, eye pain, hearing loss, nose bleeds, sore throat Cardiac:  No dizziness, chest pain or heaviness, chest tightness,edema, No JVD Resp:   +cough +sputum production, +shortness of breath,+wheezing, -hemoptysis,  Gi:  Denies swallowing difficulty, stomach pain, nausea or vomiting, diarrhea, constipation, bowel incontinence Gu:  Denies bladder incontinence, burning urine Ext:   Denies Joint pain, stiffness or swelling Skin: Denies  skin rash, easy bruising or bleeding or hives Endoc:  Denies polyuria, polydipsia , polyphagia or weight change Psych:   Denies depression, insomnia or hallucinations  Other:  All other systems negative   BP 132/82 (BP Location: Left Arm, Cuff Size: Normal)   Pulse 79   Ht 6\' 1"  (1.854 m)   Wt 147 lb (66.7 kg)   SpO2 96%   BMI 19.39 kg/m    Physical Examination:   GENERAL:NAD, no fevers, chills, no weakness no fatigue HEAD: Normocephalic, atraumatic.  EYES: Pupils equal, round, reactive to light. Extraocular muscles intact. No scleral icterus.  MOUTH: Moist mucosal membrane. Dentition intact. No abscess noted.  EAR, NOSE, THROAT: Clear without exudates. No external lesions.  NECK: Supple. No thyromegaly. No nodules. No JVD.  PULMONARY: CTA B/L + wheezing, rhonchi, crackles CARDIOVASCULAR: S1 and S2. Regular rate and rhythm. No murmurs, rubs, or gallops. No edema. Pedal pulses 2+ bilaterally.  GASTROINTESTINAL: Soft, nontender, nondistended. No masses. Positive bowel sounds. No hepatosplenomegaly.  MUSCULOSKELETAL: No swelling, clubbing, or edema. Range of motion full in all extremities.  NEUROLOGIC: Cranial nerves II through XII are intact. No gross focal neurological deficits. Sensation intact. Reflexes intact.  SKIN: No ulceration, lesions, rashes, or cyanosis. Skin warm and dry. Turgor intact.  PSYCHIATRIC: Mood, affect within normal  limits. The patient is awake, alert and oriented x 3. Insight, judgment intact.  ALL OTHER ROS ARE NEGATIVE       ASSESSMENT / PLAN: 62 year old pleasant white male with multiple medical problems including CHF very severe COPD acute on chronic hypoxic respiratory failure from his COPD with a history of chronic A. fib and pacemaker  with hypertension CAD AICD history of MI   #1 shortness of breath and dyspnea on exertion related to multifactorial etiologies including his CHF severe COPD and deconditioned state  #2 COPD severe Gold stage D Continue inhalers as prescribed patient is currently on Advair and Spiriva COPD seems to be stable at this time no acute decompensation noted No indication for antibiotics at this time No indication for oral p home anymore any rednisone at this time  #3 allergic rhinitis Continue antihistamine as prescribed  #4 chronic hypoxic respiratory failure from CHF and COPD Patient uses and benefits from oxygen therapy as prescribed  #5 extensive smoking history with severe COPD Patient had bilateral subcentimeter nodules largest solid nodule is to be 4 mm Due to his high risk of malignancy patient has been having follow-up CT scans  Need to schedule II cans CT chest 10/19 Similar appearance (over 8 months) of right upper lobe pulmonary nodules, including the 4 mm nodule of concern on the prior.   Recommend follow-up CT chest in 6 months  #6 atrial fibrillation Continue anticoagulation as prescribed  #7 systolic CHF Follow-up cardiology as scheduled Continue Lasix as needed  #8 mild COPD exacerbation Start prednisone 20 mg daily fro 10 days Start Z pak   Patient/Family are satisfied with Plan of action and management. All questions answered  Corrin Parker, M.D.  [facility]  Medical Director [place] Director [facility] Cardio-Pulmonary Department

## 2018-03-22 NOTE — Patient Instructions (Addendum)
Prednisone 20 mg daily for 10 days  Z pak  Dicussed Pneumovax  Continue inhalers as prescribed  Continue Oxygen as prescribed  CT chest in 6 months  FLU SHOT up to date

## 2018-03-25 DIAGNOSIS — J441 Chronic obstructive pulmonary disease with (acute) exacerbation: Secondary | ICD-10-CM | POA: Diagnosis not present

## 2018-04-07 DIAGNOSIS — I10 Essential (primary) hypertension: Secondary | ICD-10-CM | POA: Diagnosis not present

## 2018-04-07 DIAGNOSIS — I5022 Chronic systolic (congestive) heart failure: Secondary | ICD-10-CM | POA: Diagnosis not present

## 2018-04-07 DIAGNOSIS — I2510 Atherosclerotic heart disease of native coronary artery without angina pectoris: Secondary | ICD-10-CM | POA: Diagnosis not present

## 2018-04-07 DIAGNOSIS — I482 Chronic atrial fibrillation, unspecified: Secondary | ICD-10-CM | POA: Diagnosis not present

## 2018-04-07 DIAGNOSIS — I071 Rheumatic tricuspid insufficiency: Secondary | ICD-10-CM | POA: Diagnosis not present

## 2018-04-19 DIAGNOSIS — I10 Essential (primary) hypertension: Secondary | ICD-10-CM | POA: Diagnosis not present

## 2018-04-24 DIAGNOSIS — J441 Chronic obstructive pulmonary disease with (acute) exacerbation: Secondary | ICD-10-CM | POA: Diagnosis not present

## 2018-04-26 DIAGNOSIS — I2510 Atherosclerotic heart disease of native coronary artery without angina pectoris: Secondary | ICD-10-CM | POA: Diagnosis not present

## 2018-04-26 DIAGNOSIS — I739 Peripheral vascular disease, unspecified: Secondary | ICD-10-CM | POA: Diagnosis not present

## 2018-04-26 DIAGNOSIS — I70219 Atherosclerosis of native arteries of extremities with intermittent claudication, unspecified extremity: Secondary | ICD-10-CM | POA: Diagnosis not present

## 2018-04-26 DIAGNOSIS — E782 Mixed hyperlipidemia: Secondary | ICD-10-CM | POA: Diagnosis not present

## 2018-04-26 DIAGNOSIS — I10 Essential (primary) hypertension: Secondary | ICD-10-CM | POA: Diagnosis not present

## 2018-04-26 DIAGNOSIS — D6851 Activated protein C resistance: Secondary | ICD-10-CM | POA: Diagnosis not present

## 2018-04-26 DIAGNOSIS — J431 Panlobular emphysema: Secondary | ICD-10-CM | POA: Diagnosis not present

## 2018-04-26 DIAGNOSIS — I482 Chronic atrial fibrillation, unspecified: Secondary | ICD-10-CM | POA: Diagnosis not present

## 2018-04-26 DIAGNOSIS — M5000 Cervical disc disorder with myelopathy, unspecified cervical region: Secondary | ICD-10-CM | POA: Diagnosis not present

## 2018-04-29 ENCOUNTER — Ambulatory Visit (INDEPENDENT_AMBULATORY_CARE_PROVIDER_SITE_OTHER): Payer: Medicare HMO | Admitting: Vascular Surgery

## 2018-05-03 ENCOUNTER — Ambulatory Visit (INDEPENDENT_AMBULATORY_CARE_PROVIDER_SITE_OTHER): Payer: Medicare HMO | Admitting: Vascular Surgery

## 2018-05-03 DIAGNOSIS — I495 Sick sinus syndrome: Secondary | ICD-10-CM | POA: Diagnosis not present

## 2018-05-13 ENCOUNTER — Ambulatory Visit (INDEPENDENT_AMBULATORY_CARE_PROVIDER_SITE_OTHER): Payer: Medicare HMO | Admitting: Vascular Surgery

## 2018-05-13 VITALS — BP 125/72 | HR 16 | Resp 16 | Ht 73.0 in

## 2018-05-13 DIAGNOSIS — L7634 Postprocedural seroma of skin and subcutaneous tissue following other procedure: Secondary | ICD-10-CM | POA: Diagnosis not present

## 2018-05-13 DIAGNOSIS — I70219 Atherosclerosis of native arteries of extremities with intermittent claudication, unspecified extremity: Secondary | ICD-10-CM

## 2018-05-13 DIAGNOSIS — I10 Essential (primary) hypertension: Secondary | ICD-10-CM | POA: Diagnosis not present

## 2018-05-13 DIAGNOSIS — Z89611 Acquired absence of right leg above knee: Secondary | ICD-10-CM

## 2018-05-13 DIAGNOSIS — Z87891 Personal history of nicotine dependence: Secondary | ICD-10-CM | POA: Diagnosis not present

## 2018-05-13 NOTE — Progress Notes (Signed)
MRN : [id number]  Tyler [name] is a 62 y.o. (May 11, 1956) male who presents with chief complaint of  Chief Complaint  Patient presents with  . Follow-up    draining off fluid from knot  .  History of Present Illness: Patient returns today in follow up of PAD and recurring seroma of the left groin.  This has recurred and is somewhat painful and uncomfortable in the groin.  No redness or fever.  We aspirated this for about 80 to 90 cc of clear fluid today. He is also having more numbness in his left foot.  He has some chronic ulceration issues.  He is not slated to have his perfusion checked for several more months.  Current Outpatient Medications  Medication Sig Dispense Refill  . acetaminophen (TYLENOL) 500 MG tablet Take 1,000 mg by mouth daily as needed for moderate pain or headache.    . ALPRAZolam (XANAX) 1 MG tablet Take 1 mg by mouth 2 (two) times daily.     Marland Kitchen aspirin EC 81 MG tablet Take 1 tablet (81 mg total) by mouth daily. (Patient taking differently: Take 81 mg by mouth daily after supper. ) 150 tablet 2  . azithromycin (ZITHROMAX Z-PAK) 250 MG tablet Take 2 tablets on Day 1 and then 1 tablet daily till gone. 6 each 0  . citalopram (CELEXA) 20 MG tablet Take 20 mg by mouth at bedtime.   11  . docusate sodium (COLACE) 100 MG capsule Take 100 mg by mouth at bedtime as needed (for constipation.).     Marland Kitchen fluticasone (FLONASE) 50 MCG/ACT nasal spray Place 2 sprays into both nostrils daily.    . fluticasone-salmeterol (ADVAIR HFA) 115-21 MCG/ACT inhaler USE 2 INHALATIONS ORALLY EVERY 12 HOURS 36 Inhaler 1  . furosemide (LASIX) 20 MG tablet Take 20 mg by mouth every other day as needed (for fluid retention.).     Marland Kitchen guaiFENesin (MUCINEX) 600 MG 12 hr tablet Take 600 mg by mouth 2 (two) times daily.    Marland Kitchen ipratropium (ATROVENT) 0.03 % nasal spray Place 2 sprays into both nostrils 2 (two) times daily.     . Ipratropium-Albuterol (COMBIVENT RESPIMAT) 20-100 MCG/ACT AERS respimat Inhale  1 puff into the lungs every 4 (four) hours.     Marland Kitchen levalbuterol (XOPENEX) 1.25 MG/3ML nebulizer solution Take 1.25 mg (3 mLs total) by nebulization every 4 (four) hours. (Patient taking differently: Take 3 mLs by nebulization every 4 (four) hours as needed for wheezing or shortness of breath ((typically twice daily)). ) 72 mL 12  . loratadine (CLARITIN) 10 MG tablet Take 10 mg by mouth at bedtime.    . lovastatin (MEVACOR) 20 MG tablet Take 20 mg by mouth at bedtime.    . metoprolol succinate (TOPROL-XL) 25 MG 24 hr tablet Take 25 mg by mouth daily.    . nitroGLYCERIN (NITROSTAT) 0.4 MG SL tablet Place 0.4 mg under the tongue every 5 (five) minutes as needed for chest pain.    Marland Kitchen nystatin cream (MYCOSTATIN) APPLY TO AFFECTED AREA TWICE A DAY  0  . oxyCODONE-acetaminophen (PERCOCET) 7.5-325 MG tablet Take 1 tablet by mouth every 4 (four) hours as needed for severe pain. 30 tablet 0  . pantoprazole (PROTONIX) 40 MG tablet Take 40 mg by mouth daily before breakfast.     . predniSONE (DELTASONE) 20 MG tablet Take 1 tablet (20 mg total) by mouth daily with breakfast. 10 days 20 tablet 0  . ramipril (ALTACE) 10 MG capsule Take 10  mg by mouth at bedtime.     Marland Kitchen SPIRIVA RESPIMAT 1.25 MCG/ACT AERS INHALE 2 PUFFS INTO THE LUNGS DAILY. 1 Inhaler 2  . tamsulosin (FLOMAX) 0.4 MG CAPS capsule TAKE 1 CAPSULE (0.4 MG TOTAL) BY MOUTH ONCE DAILY TAKE 30 MINUTES AFTER SAME MEAL EACH DAY.  11  . vitamin B-12 (CYANOCOBALAMIN) 1000 MCG tablet Take 1,000 mcg by mouth daily.    Marland Kitchen warfarin (COUMADIN) 5 MG tablet Take 5 mg by mouth at bedtime.     . Wound Dressings (RESTORE WOUND CARE DRESSING) PADS Apply topically.     No current facility-administered medications for this visit.     Past Medical History:  Diagnosis Date  . AICD (automatic cardioverter/defibrillator) present   . Arthritis   . Atrial fibrillation ([place])   . Cervical spinal stenosis    with neuropathy  . CHF (congestive heart failure) ([place])   . COPD  (chronic obstructive pulmonary disease) ([place])   . Coronary artery disease   . Depression   . Dyspnea   . GERD (gastroesophageal reflux disease)   . Hypertension   . Myocardial infarction ([place])   . Peripheral vascular disease ([place])   . Presence of permanent cardiac pacemaker     Past Surgical History:  Procedure Laterality Date  . ABOVE KNEE LEG AMPUTATION Right    after below the knee amputation   . APPLICATION OF WOUND VAC Left 01/12/2018   Procedure: APPLICATION OF WOUND VAC;  Surgeon: Algernon Huxley, MD;  Location: [facility];  Service: Vascular;  Laterality: Left;  . BELOW KNEE LEG AMPUTATION Right   . CATARACT EXTRACTION, BILATERAL Bilateral   . ENDARTERECTOMY FEMORAL Left 08/11/2017   Procedure: ENDARTERECTOMY FEMORAL;  Surgeon: Algernon Huxley, MD;  Location: [facility];  Service: Vascular;  Laterality: Left;  . HEMATOMA EVACUATION Left 01/12/2018   Procedure: EVACUATION HEMATOMA ( DRAINING OF SEROMA);  Surgeon: Algernon Huxley, MD;  Location: [facility];  Service: Vascular;  Laterality: Left;  . IMPLANTABLE CARDIOVERTER DEFIBRILLATOR (ICD) GENERATOR CHANGE Left 02/10/2017   Procedure: ICD GENERATOR CHANGE;  Surgeon: Isaias Cowman, MD;  Location: [facility];  Service: Cardiovascular;  Laterality: Left;  . INSERT / REPLACE / REMOVE PACEMAKER    . LOWER EXTREMITY ANGIOGRAPHY Left 06/07/2017   Procedure: LOWER EXTREMITY ANGIOGRAPHY;  Surgeon: Algernon Huxley, MD;  Location: [place] [facility];  Service: Cardiovascular;  Laterality: Left;  . LOWER EXTREMITY INTERVENTION  06/07/2017   Procedure: LOWER EXTREMITY INTERVENTION;  Surgeon: Algernon Huxley, MD;  Location: [place] [facility];  Service: Cardiovascular;;  . PERIPHERAL VASCULAR CATHETERIZATION Left 12/09/2015   Procedure: Lower Extremity Angiography;  Surgeon: Algernon Huxley, MD;  Location: [place] [facility];  Service: Cardiovascular;  Laterality: Left;  . PERIPHERAL VASCULAR CATHETERIZATION  12/09/2015   Procedure: Lower Extremity  Intervention;  Surgeon: Algernon Huxley, MD;  Location: [place] [facility];  Service: Cardiovascular;;  . TONSILLECTOMY      Social History Social History   Tobacco Use  . Smoking status: Former Smoker    Packs/day: 1.00    Years: 42.00    Pack years: 42.00    Types: Cigarettes    Last attempt to quit: 09/23/2013    Years since quitting: 4.6  . Smokeless tobacco: Never Used  Substance Use Topics  . Alcohol use: No  . Drug use: No     Family History Family History  Problem Relation Age of Onset  . Cancer Mother   . Cancer Father   .  Heart disease Father      Allergies  Allergen Reactions  . Apixaban Rash  . Rivaroxaban Rash     REVIEW OF SYSTEMS (Negative unless checked)  Constitutional: [] Weight loss  [] Fever  [] Chills Cardiac: [] Chest pain   [] Chest pressure   [] Palpitations   [] Shortness of breath when laying flat   [] Shortness of breath at rest   [] Shortness of breath with exertion. Vascular:  [] Pain in legs with walking   [] Pain in legs at rest   [] Pain in legs when laying flat   [] Claudication   [] Pain in feet when walking  [] Pain in feet at rest  [] Pain in feet when laying flat   [] History of DVT   [] Phlebitis   [] Swelling in legs   [] Varicose veins   [x] Non-healing ulcers Pulmonary:   [x] Uses home oxygen   [] Productive cough   [] Hemoptysis   [] Wheeze  [x] COPD   [] Asthma Neurologic:  [] Dizziness  [] Blackouts   [] Seizures   [] History of stroke   [] History of TIA  [] Aphasia   [] Temporary blindness   [] Dysphagia   [] Weakness or numbness in arms   [] Weakness or numbness in legs Musculoskeletal:  [] Arthritis   [] Joint swelling   [] Joint pain   [x] Low back pain Hematologic:  [] Easy bruising  [] Easy bleeding   [] Hypercoagulable state   [] Anemic   Gastrointestinal:  [] Blood in stool   [] Vomiting blood  [] Gastroesophageal reflux/heartburn   [] Abdominal pain Genitourinary:  [] Chronic kidney disease   [] Difficult urination  [] Frequent urination  [] Burning with urination    [] Hematuria Skin:  [] Rashes   [x] Ulcers   [x] Wounds Psychological:  [] History of anxiety   []  History of major depression.  Physical Examination  BP 125/72   Pulse (!) 16   Resp 16   Ht 6\' 1"  (1.854 m)   BMI 19.39 kg/m  Gen:  WD/WN, NAD Head: [place]/AT, No temporalis wasting. Ear/Nose/Throat: Hearing grossly intact, nares w/o erythema or drainage Eyes: Conjunctiva clear. Sclera non-icteric Neck: Supple.  Trachea midline Pulmonary:  Good air movement, no use of accessory muscles.  Cardiac: RRR, no JVD Vascular:  Vessel Right Left  Radial Palpable Palpable                          PT  not palpable  1+ palpable  DP  not palpable  1+ palpable   Musculoskeletal: M/S 5/5 throughout.  Right AKA.  Moderate size left femoral seroma which was aspirated today without difficulty with about 80 to 90 cc of clear fluid removed. Neurologic: Sensation grossly intact in extremities.  Symmetrical.  Speech is fluent.  Psychiatric: Judgment intact, Mood & affect appropriate for pt's clinical situation.        Labs No results found for this or any previous visit (from the past 2160 hour(s)).  Radiology No results found.  Assessment/Plan  Hx of AKA (above knee amputation), right (HCC) Well healed  Essential hypertension, benign blood pressure control important in reducing the progression of atherosclerotic disease. On appropriate oral medications.   Postoperative seroma of subcutaneous tissue after non-dermatologic procedure Aspirated again today  Atherosclerotic peripheral vascular disease with intermittent claudication ([place]) Will move up ABIs to next month    Leotis Pain, MD  05/13/2018 11:09 AM    This note was created with Dragon medical transcription system.  Any errors from dictation are purely unintentional

## 2018-05-13 NOTE — Assessment & Plan Note (Signed)
Will move up ABIs to next month

## 2018-05-13 NOTE — Assessment & Plan Note (Signed)
Aspirated again today. 

## 2018-05-13 NOTE — Assessment & Plan Note (Signed)
blood pressure control important in reducing the progression of atherosclerotic disease. On appropriate oral medications.  

## 2018-05-13 NOTE — Assessment & Plan Note (Signed)
Well-healed

## 2018-05-23 DIAGNOSIS — R1310 Dysphagia, unspecified: Secondary | ICD-10-CM | POA: Diagnosis not present

## 2018-05-23 DIAGNOSIS — K219 Gastro-esophageal reflux disease without esophagitis: Secondary | ICD-10-CM | POA: Diagnosis not present

## 2018-05-23 DIAGNOSIS — Z86010 Personal history of colonic polyps: Secondary | ICD-10-CM | POA: Diagnosis not present

## 2018-05-24 ENCOUNTER — Other Ambulatory Visit: Payer: Self-pay | Admitting: Student

## 2018-05-24 DIAGNOSIS — K219 Gastro-esophageal reflux disease without esophagitis: Secondary | ICD-10-CM

## 2018-05-25 DIAGNOSIS — J441 Chronic obstructive pulmonary disease with (acute) exacerbation: Secondary | ICD-10-CM | POA: Diagnosis not present

## 2018-05-27 DIAGNOSIS — I482 Chronic atrial fibrillation, unspecified: Secondary | ICD-10-CM | POA: Diagnosis not present

## 2018-05-27 DIAGNOSIS — I70219 Atherosclerosis of native arteries of extremities with intermittent claudication, unspecified extremity: Secondary | ICD-10-CM | POA: Diagnosis not present

## 2018-05-29 ENCOUNTER — Other Ambulatory Visit: Payer: Self-pay | Admitting: Internal Medicine

## 2018-05-30 ENCOUNTER — Other Ambulatory Visit: Payer: Medicare HMO

## 2018-05-31 ENCOUNTER — Ambulatory Visit
Admission: RE | Admit: 2018-05-31 | Discharge: 2018-05-31 | Disposition: A | Discharge status: Home / Self Care | Payer: Medicare HMO | Source: Ambulatory Visit | Attending: Student | Admitting: Student

## 2018-05-31 DIAGNOSIS — K219 Gastro-esophageal reflux disease without esophagitis: Secondary | ICD-10-CM | POA: Insufficient documentation

## 2018-06-10 DIAGNOSIS — L03116 Cellulitis of left lower limb: Secondary | ICD-10-CM | POA: Diagnosis not present

## 2018-06-10 DIAGNOSIS — J40 Bronchitis, not specified as acute or chronic: Secondary | ICD-10-CM | POA: Diagnosis not present

## 2018-06-10 DIAGNOSIS — J431 Panlobular emphysema: Secondary | ICD-10-CM | POA: Diagnosis not present

## 2018-06-14 ENCOUNTER — Encounter (INDEPENDENT_AMBULATORY_CARE_PROVIDER_SITE_OTHER): Payer: Medicare HMO

## 2018-06-14 ENCOUNTER — Ambulatory Visit (INDEPENDENT_AMBULATORY_CARE_PROVIDER_SITE_OTHER): Payer: Medicare HMO | Admitting: Vascular Surgery

## 2018-06-16 DIAGNOSIS — L03116 Cellulitis of left lower limb: Secondary | ICD-10-CM | POA: Diagnosis not present

## 2018-06-25 DIAGNOSIS — J441 Chronic obstructive pulmonary disease with (acute) exacerbation: Secondary | ICD-10-CM | POA: Diagnosis not present

## 2018-06-28 DIAGNOSIS — I70219 Atherosclerosis of native arteries of extremities with intermittent claudication, unspecified extremity: Secondary | ICD-10-CM | POA: Diagnosis not present

## 2018-06-28 DIAGNOSIS — I482 Chronic atrial fibrillation, unspecified: Secondary | ICD-10-CM | POA: Diagnosis not present

## 2018-07-05 DIAGNOSIS — I482 Chronic atrial fibrillation, unspecified: Secondary | ICD-10-CM | POA: Diagnosis not present

## 2018-07-19 ENCOUNTER — Ambulatory Visit (INDEPENDENT_AMBULATORY_CARE_PROVIDER_SITE_OTHER): Payer: Medicare HMO

## 2018-07-19 ENCOUNTER — Encounter (INDEPENDENT_AMBULATORY_CARE_PROVIDER_SITE_OTHER): Payer: Medicare HMO

## 2018-07-19 ENCOUNTER — Other Ambulatory Visit: Payer: Self-pay

## 2018-07-19 ENCOUNTER — Ambulatory Visit (INDEPENDENT_AMBULATORY_CARE_PROVIDER_SITE_OTHER): Payer: Medicare HMO | Admitting: Vascular Surgery

## 2018-07-19 ENCOUNTER — Encounter (INDEPENDENT_AMBULATORY_CARE_PROVIDER_SITE_OTHER): Payer: Self-pay | Admitting: Vascular Surgery

## 2018-07-19 VITALS — BP 127/84 | HR 80 | Resp 12 | Ht 73.0 in | Wt 155.0 lb

## 2018-07-19 DIAGNOSIS — Z87891 Personal history of nicotine dependence: Secondary | ICD-10-CM

## 2018-07-19 DIAGNOSIS — I10 Essential (primary) hypertension: Secondary | ICD-10-CM | POA: Diagnosis not present

## 2018-07-19 DIAGNOSIS — Z89611 Acquired absence of right leg above knee: Secondary | ICD-10-CM

## 2018-07-19 DIAGNOSIS — I739 Peripheral vascular disease, unspecified: Secondary | ICD-10-CM | POA: Diagnosis not present

## 2018-07-19 DIAGNOSIS — L7634 Postprocedural seroma of skin and subcutaneous tissue following other procedure: Secondary | ICD-10-CM | POA: Diagnosis not present

## 2018-07-19 NOTE — Assessment & Plan Note (Signed)
ABI today is 0.92 which is stable from previous studies.  This may be somewhat falsely elevated but the flow appears to be intact distally.  At this point, no role for intervention.  Plan to recheck in 6 months with noninvasive studies.  We can aspirate his seroma again if it becomes symptomatic in the interim.

## 2018-07-19 NOTE — Assessment & Plan Note (Signed)
Aspirated again today.  No sign of infection.

## 2018-07-19 NOTE — Progress Notes (Signed)
MRN : [id number]  Tyler [name] is a 63 y.o. (09-Nov-1955) male who presents with chief complaint of  Chief Complaint  Patient presents with  . Follow-up  .  History of Present Illness: Patient returns today in follow up of his severe peripheral arterial disease.  He still has pain in his left great toe but no new signs of ulceration or infection.  His left ABI today is 0.92 which is stable from previous studies.  He does have recurrence of his left groin seroma and I aspirated about 75 cc of straw to clear fluid today with no sign of infection.  Current Outpatient Medications  Medication Sig Dispense Refill  . acetaminophen (TYLENOL) 500 MG tablet Take 1,000 mg by mouth daily as needed for moderate pain or headache.    . ALPRAZolam (XANAX) 1 MG tablet Take 1 mg by mouth 2 (two) times daily.     Marland Kitchen aspirin EC 81 MG tablet Take 1 tablet (81 mg total) by mouth daily. (Patient taking differently: Take 81 mg by mouth daily after supper. ) 150 tablet 2  . citalopram (CELEXA) 20 MG tablet Take 20 mg by mouth at bedtime.   11  . docusate sodium (COLACE) 100 MG capsule Take 100 mg by mouth at bedtime as needed (for constipation.).     Marland Kitchen fluticasone (FLONASE) 50 MCG/ACT nasal spray Place 2 sprays into both nostrils daily.    . fluticasone-salmeterol (ADVAIR HFA) 115-21 MCG/ACT inhaler USE 2 INHALATIONS ORALLY EVERY 12 HOURS 36 Inhaler 5  . furosemide (LASIX) 20 MG tablet Take 20 mg by mouth every other day as needed (for fluid retention.).     Marland Kitchen guaiFENesin (MUCINEX) 600 MG 12 hr tablet Take 600 mg by mouth 2 (two) times daily.    Marland Kitchen ipratropium (ATROVENT) 0.03 % nasal spray Place 2 sprays into both nostrils 2 (two) times daily.     . Ipratropium-Albuterol (COMBIVENT RESPIMAT) 20-100 MCG/ACT AERS respimat Inhale 1 puff into the lungs every 4 (four) hours.     Marland Kitchen levalbuterol (XOPENEX) 1.25 MG/3ML nebulizer solution Take 1.25 mg (3 mLs total) by nebulization every 4 (four) hours. (Patient taking  differently: Take 3 mLs by nebulization every 4 (four) hours as needed for wheezing or shortness of breath ((typically twice daily)). ) 72 mL 12  . loratadine (CLARITIN) 10 MG tablet Take 10 mg by mouth at bedtime.    . lovastatin (MEVACOR) 20 MG tablet Take 20 mg by mouth at bedtime.    . metoprolol succinate (TOPROL-XL) 25 MG 24 hr tablet Take 25 mg by mouth daily.    . nitroGLYCERIN (NITROSTAT) 0.4 MG SL tablet Place 0.4 mg under the tongue every 5 (five) minutes as needed for chest pain.    Marland Kitchen nystatin cream (MYCOSTATIN) APPLY TO AFFECTED AREA TWICE A DAY  0  . oxyCODONE-acetaminophen (PERCOCET) 7.5-325 MG tablet Take 1 tablet by mouth every 4 (four) hours as needed for severe pain. 30 tablet 0  . pantoprazole (PROTONIX) 40 MG tablet Take 40 mg by mouth daily before breakfast.     . ramipril (ALTACE) 10 MG capsule Take 10 mg by mouth at bedtime.     Marland Kitchen SPIRIVA RESPIMAT 1.25 MCG/ACT AERS INHALE 2 PUFFS INTO THE LUNGS DAILY. 1 Inhaler 2  . tamsulosin (FLOMAX) 0.4 MG CAPS capsule TAKE 1 CAPSULE (0.4 MG TOTAL) BY MOUTH ONCE DAILY TAKE 30 MINUTES AFTER SAME MEAL EACH DAY.  11  . vitamin B-12 (CYANOCOBALAMIN) 1000 MCG tablet Take 1,000  mcg by mouth daily.    Marland Kitchen warfarin (COUMADIN) 5 MG tablet Take 5 mg by mouth at bedtime.     . Wound Dressings (RESTORE WOUND CARE DRESSING) PADS Apply topically.    Marland Kitchen azithromycin (ZITHROMAX Z-PAK) 250 MG tablet Take 2 tablets on Day 1 and then 1 tablet daily till gone. (Patient not taking: Reported on 07/19/2018) 6 each 0  . predniSONE (DELTASONE) 20 MG tablet Take 1 tablet (20 mg total) by mouth daily with breakfast. 10 days (Patient not taking: Reported on 07/19/2018) 20 tablet 0   No current facility-administered medications for this visit.     Past Medical History:  Diagnosis Date  . AICD (automatic cardioverter/defibrillator) present   . Arthritis   . Atrial fibrillation ([place])   . Cervical spinal stenosis    with neuropathy  . CHF (congestive heart failure)  ([place])   . COPD (chronic obstructive pulmonary disease) ([place])   . Coronary artery disease   . Depression   . Dyspnea   . GERD (gastroesophageal reflux disease)   . Hypertension   . Myocardial infarction ([place])   . Peripheral vascular disease ([place])   . Presence of permanent cardiac pacemaker     Past Surgical History:  Procedure Laterality Date  . ABOVE KNEE LEG AMPUTATION Right    after below the knee amputation   . APPLICATION OF WOUND VAC Left 01/12/2018   Procedure: APPLICATION OF WOUND VAC;  Surgeon: Algernon Huxley, MD;  Location: [facility];  Service: Vascular;  Laterality: Left;  . BELOW KNEE LEG AMPUTATION Right   . CATARACT EXTRACTION, BILATERAL Bilateral   . ENDARTERECTOMY FEMORAL Left 08/11/2017   Procedure: ENDARTERECTOMY FEMORAL;  Surgeon: Algernon Huxley, MD;  Location: [facility];  Service: Vascular;  Laterality: Left;  . HEMATOMA EVACUATION Left 01/12/2018   Procedure: EVACUATION HEMATOMA ( DRAINING OF SEROMA);  Surgeon: Algernon Huxley, MD;  Location: [facility];  Service: Vascular;  Laterality: Left;  . IMPLANTABLE CARDIOVERTER DEFIBRILLATOR (ICD) GENERATOR CHANGE Left 02/10/2017   Procedure: ICD GENERATOR CHANGE;  Surgeon: Isaias Cowman, MD;  Location: [facility];  Service: Cardiovascular;  Laterality: Left;  . INSERT / REPLACE / REMOVE PACEMAKER    . LOWER EXTREMITY ANGIOGRAPHY Left 06/07/2017   Procedure: LOWER EXTREMITY ANGIOGRAPHY;  Surgeon: Algernon Huxley, MD;  Location: [place] [facility];  Service: Cardiovascular;  Laterality: Left;  . LOWER EXTREMITY INTERVENTION  06/07/2017   Procedure: LOWER EXTREMITY INTERVENTION;  Surgeon: Algernon Huxley, MD;  Location: [place] [facility];  Service: Cardiovascular;;  . PERIPHERAL VASCULAR CATHETERIZATION Left 12/09/2015   Procedure: Lower Extremity Angiography;  Surgeon: Algernon Huxley, MD;  Location: [place] [facility];  Service: Cardiovascular;  Laterality: Left;  . PERIPHERAL VASCULAR CATHETERIZATION  12/09/2015   Procedure: Lower  Extremity Intervention;  Surgeon: Algernon Huxley, MD;  Location: [place] [facility];  Service: Cardiovascular;;  . TONSILLECTOMY     Social History        Tobacco Use  . Smoking status: Former Smoker    Packs/day: 1.00    Years: 42.00    Pack years: 42.00    Types: Cigarettes    Last attempt to quit: 09/23/2013    Years since quitting: 4.6  . Smokeless tobacco: Never Used  Substance Use Topics  . Alcohol use: No  . Drug use: No     Family History      Family History  Problem Relation Age of Onset  . Cancer Mother   .  Cancer Father   . Heart disease Father          Allergies  Allergen Reactions  . Apixaban Rash  . Rivaroxaban Rash     REVIEW OF SYSTEMS (Negative unless checked)  Constitutional: [] ?Weight loss  [] ?Fever  [] ?Chills Cardiac: [] ?Chest pain   [] ?Chest pressure   [] ?Palpitations   [] ?Shortness of breath when laying flat   [] ?Shortness of breath at rest   [] ?Shortness of breath with exertion. Vascular:  [] ?Pain in legs with walking   [] ?Pain in legs at rest   [] ?Pain in legs when laying flat   [] ?Claudication   [] ?Pain in feet when walking  [] ?Pain in feet at rest  [] ?Pain in feet when laying flat   [] ?History of DVT   [] ?Phlebitis   [] ?Swelling in legs   [] ?Varicose veins   [x] ?Non-healing ulcers Pulmonary:   [x] ?Uses home oxygen   [] ?Productive cough   [] ?Hemoptysis   [] ?Wheeze  [x] ?COPD   [] ?Asthma Neurologic:  [] ?Dizziness  [] ?Blackouts   [] ?Seizures   [] ?History of stroke   [] ?History of TIA  [] ?Aphasia   [] ?Temporary blindness   [] ?Dysphagia   [] ?Weakness or numbness in arms   [] ?Weakness or numbness in legs Musculoskeletal:  [] ?Arthritis   [] ?Joint swelling   [] ?Joint pain   [x] ?Low back pain Hematologic:  [] ?Easy bruising  [] ?Easy bleeding   [] ?Hypercoagulable state   [] ?Anemic   Gastrointestinal:  [] ?Blood in stool   [] ?Vomiting blood  [] ?Gastroesophageal reflux/heartburn   [] ?Abdominal pain Genitourinary:  [] ?Chronic kidney  disease   [] ?Difficult urination  [] ?Frequent urination  [] ?Burning with urination   [] ?Hematuria Skin:  [] ?Rashes   [x] ?Ulcers   [x] ?Wounds Psychological:  [] ?History of anxiety   [] ? History of major depression.    Physical Examination  BP 127/84 (BP Location: Left Arm, Patient Position: Sitting, Cuff Size: Large)   Pulse 80   Resp 12   Ht 6\' 1"  (1.854 m)   Wt 155 lb (70.3 kg)   BMI 20.45 kg/m  Gen:  WD/WN, NAD Head: [place]/AT, No temporalis wasting. Ear/Nose/Throat: Hearing grossly intact, nares w/o erythema or drainage Eyes: Conjunctiva clear. Sclera non-icteric Neck: Supple.  Trachea midline Pulmonary:  Good air movement, no use of accessory muscles.  Cardiac: RRR, no JVD Vascular:  Vessel Right Left  Radial Palpable Palpable                          PT  not palpable  1+ palpable  DP  not palpable  1+ palpable    Musculoskeletal: M/S 5/5 throughout.  Right AKA.  Hen egg-sized seroma in the left groin which was aspirated easily today after prepping the groin.  About 75 cc of straw to clear fluid was removed.  No significant lower extremity edema. Neurologic: Sensation grossly intact in extremities.  Symmetrical.  Speech is fluent.  Psychiatric: Judgment intact, Mood & affect appropriate for pt's clinical situation. Dermatologic: No rashes or ulcers noted.  No cellulitis or open wounds.       Labs No results found for this or any previous visit (from the past 2160 hour(s)).  Radiology Vas [place] Burnard Bunting With/wo Tbi  Result Date: 07/19/2018 LOWER EXTREMITY DOPPLER STUDY Indications: Claudication, rest pain, and peripheral artery disease.  Comparison Study: 03/11/2018 Performing Technologist: Almira Coaster RVS  Examination Guidelines: A complete evaluation includes at minimum, Doppler waveform signals and systolic blood pressure reading at the level of bilateral brachial, anterior tibial, and posterior tibial arteries, when vessel segments are  accessible. Bilateral testing  is considered an integral part of a complete examination. Photoelectric Plethysmograph (PPG) waveforms and toe systolic pressure readings are included as required and additional duplex testing as needed. Limited examinations for reoccurring indications may be performed as noted.  ABI Findings: +--------+------------------+-----+--------+--------+ Right   Rt Pressure (mmHg)IndexWaveformComment  +--------+------------------+-----+--------+--------+ [id number]                                     +--------+------------------+-----+--------+--------+ +--------+------------------+-----+----------+-------+ Left    Lt Pressure (mmHg)IndexWaveform  Comment +--------+------------------+-----+----------+-------+ [id number]                                      +--------+------------------+-----+----------+-------+ ATA     155               0.92 monophasic        +--------+------------------+-----+----------+-------+ PTA     250               1.48 monophasicNC      +--------+------------------+-----+----------+-------+ +-------+-----------+-----------+------------+------------+ ABI/TBIToday's ABIToday's TBIPrevious ABIPrevious TBI +-------+-----------+-----------+------------+------------+ Left   .92        .36        1.44        .48          +-------+-----------+-----------+------------+------------+ Left ABIs appear essentially unchanged compared to prior study on 03/11/2018. Left TBIs appear decreased compared to prior study on 03/11/2018. Imaged Both Lt Tibial Arteries and Peroneal Artery at the level of the Ankle.  Summary: Right: Rt AKA. Left: Resting left ankle-brachial index indicates mild left lower extremity arterial disease. The left toe-brachial index is abnormal. ABIs are unreliable.  *See table(s) above for measurements and observations.  Electronically signed by Leotis Pain MD on 07/19/2018 at 2:42:36 PM.   Final     Assessment/Plan Hx of AKA (above knee  amputation), right ([place]) Well healed  Essential hypertension, benign blood pressure control important in reducing the progression of atherosclerotic disease. On appropriate oral medications.  PVD (peripheral vascular disease) (HCC) ABI today is 0.92 which is stable from previous studies.  This may be somewhat falsely elevated but the flow appears to be intact distally.  At this point, no role for intervention.  Plan to recheck in 6 months with noninvasive studies.  We can aspirate his seroma again if it becomes symptomatic in the interim.  Postoperative seroma of subcutaneous tissue after non-dermatologic procedure Aspirated again today.  No sign of infection.    Leotis Pain, MD  07/19/2018 3:48 PM    This note was created with Dragon medical transcription system.  Any errors from dictation are purely unintentional

## 2018-07-24 DIAGNOSIS — J441 Chronic obstructive pulmonary disease with (acute) exacerbation: Secondary | ICD-10-CM | POA: Diagnosis not present

## 2018-07-26 DIAGNOSIS — Z89511 Acquired absence of right leg below knee: Secondary | ICD-10-CM | POA: Diagnosis not present

## 2018-07-26 DIAGNOSIS — F320 Major depressive disorder, single episode, mild: Secondary | ICD-10-CM | POA: Diagnosis not present

## 2018-07-26 DIAGNOSIS — I2510 Atherosclerotic heart disease of native coronary artery without angina pectoris: Secondary | ICD-10-CM | POA: Diagnosis not present

## 2018-07-26 DIAGNOSIS — Z0000 Encounter for general adult medical examination without abnormal findings: Secondary | ICD-10-CM | POA: Diagnosis not present

## 2018-07-26 DIAGNOSIS — I482 Chronic atrial fibrillation, unspecified: Secondary | ICD-10-CM | POA: Diagnosis not present

## 2018-07-26 DIAGNOSIS — D6851 Activated protein C resistance: Secondary | ICD-10-CM | POA: Diagnosis not present

## 2018-07-26 DIAGNOSIS — I10 Essential (primary) hypertension: Secondary | ICD-10-CM | POA: Diagnosis not present

## 2018-07-26 DIAGNOSIS — I70219 Atherosclerosis of native arteries of extremities with intermittent claudication, unspecified extremity: Secondary | ICD-10-CM | POA: Diagnosis not present

## 2018-07-26 DIAGNOSIS — M5000 Cervical disc disorder with myelopathy, unspecified cervical region: Secondary | ICD-10-CM | POA: Diagnosis not present

## 2018-08-02 DIAGNOSIS — I5022 Chronic systolic (congestive) heart failure: Secondary | ICD-10-CM | POA: Diagnosis not present

## 2018-08-04 DIAGNOSIS — I482 Chronic atrial fibrillation, unspecified: Secondary | ICD-10-CM | POA: Diagnosis not present

## 2018-08-15 ENCOUNTER — Telehealth: Payer: Self-pay

## 2018-08-15 NOTE — Telephone Encounter (Signed)
Telephone call to schedule time for Palliative Care NP to visit.  Wife Tyler [name] reports patient I "doing OK' and has no needs at the present time.  Wife states patient has not been to the doctor.  Wife informed Palliative Care team would call back in 1-2 months to offer visit.  Wife verbalizes appreciation for call.

## 2018-08-24 DIAGNOSIS — J441 Chronic obstructive pulmonary disease with (acute) exacerbation: Secondary | ICD-10-CM | POA: Diagnosis not present

## 2018-09-05 DIAGNOSIS — I482 Chronic atrial fibrillation, unspecified: Secondary | ICD-10-CM | POA: Diagnosis not present

## 2018-09-13 ENCOUNTER — Encounter (INDEPENDENT_AMBULATORY_CARE_PROVIDER_SITE_OTHER): Payer: Medicare HMO

## 2018-09-13 ENCOUNTER — Ambulatory Visit (INDEPENDENT_AMBULATORY_CARE_PROVIDER_SITE_OTHER): Payer: Medicare HMO | Admitting: Vascular Surgery

## 2018-09-15 DIAGNOSIS — M5481 Occipital neuralgia: Secondary | ICD-10-CM | POA: Diagnosis not present

## 2018-09-15 DIAGNOSIS — M542 Cervicalgia: Secondary | ICD-10-CM | POA: Diagnosis not present

## 2018-09-22 DIAGNOSIS — L02419 Cutaneous abscess of limb, unspecified: Secondary | ICD-10-CM | POA: Diagnosis not present

## 2018-09-22 DIAGNOSIS — R6889 Other general symptoms and signs: Secondary | ICD-10-CM | POA: Diagnosis not present

## 2018-09-22 DIAGNOSIS — R05 Cough: Secondary | ICD-10-CM | POA: Diagnosis not present

## 2018-09-22 DIAGNOSIS — L03119 Cellulitis of unspecified part of limb: Secondary | ICD-10-CM | POA: Diagnosis not present

## 2018-09-23 DIAGNOSIS — J441 Chronic obstructive pulmonary disease with (acute) exacerbation: Secondary | ICD-10-CM | POA: Diagnosis not present

## 2018-10-06 DIAGNOSIS — I482 Chronic atrial fibrillation, unspecified: Secondary | ICD-10-CM | POA: Diagnosis not present

## 2018-10-24 DIAGNOSIS — J441 Chronic obstructive pulmonary disease with (acute) exacerbation: Secondary | ICD-10-CM | POA: Diagnosis not present

## 2018-10-28 ENCOUNTER — Telehealth: Payer: Self-pay | Admitting: Primary Care

## 2018-10-28 NOTE — Telephone Encounter (Signed)
T/c to patient, reached at home number. He states he has been doing well, had an abx recently but that he has not had any hospitalizations for some time. He states he would like a call later in the summer to schedule visit once home visits begin again. Will t/c in 2-3 mos for appt.

## 2018-11-01 ENCOUNTER — Other Ambulatory Visit: Payer: Self-pay

## 2018-11-01 ENCOUNTER — Ambulatory Visit
Admission: RE | Admit: 2018-11-01 | Discharge: 2018-11-01 | Disposition: A | Discharge status: Home / Self Care | Payer: Medicare HMO | Source: Ambulatory Visit | Attending: Internal Medicine | Admitting: Internal Medicine

## 2018-11-01 DIAGNOSIS — J439 Emphysema, unspecified: Secondary | ICD-10-CM | POA: Diagnosis not present

## 2018-11-01 DIAGNOSIS — R918 Other nonspecific abnormal finding of lung field: Secondary | ICD-10-CM | POA: Diagnosis not present

## 2018-11-01 DIAGNOSIS — I495 Sick sinus syndrome: Secondary | ICD-10-CM | POA: Diagnosis not present

## 2018-11-07 DIAGNOSIS — I2510 Atherosclerotic heart disease of native coronary artery without angina pectoris: Secondary | ICD-10-CM | POA: Diagnosis not present

## 2018-11-07 DIAGNOSIS — I482 Chronic atrial fibrillation, unspecified: Secondary | ICD-10-CM | POA: Diagnosis not present

## 2018-11-08 ENCOUNTER — Encounter: Payer: Self-pay | Admitting: Internal Medicine

## 2018-11-08 ENCOUNTER — Ambulatory Visit (INDEPENDENT_AMBULATORY_CARE_PROVIDER_SITE_OTHER): Payer: Medicare HMO | Admitting: Internal Medicine

## 2018-11-08 DIAGNOSIS — J9611 Chronic respiratory failure with hypoxia: Secondary | ICD-10-CM | POA: Diagnosis not present

## 2018-11-08 DIAGNOSIS — R918 Other nonspecific abnormal finding of lung field: Secondary | ICD-10-CM | POA: Diagnosis not present

## 2018-11-08 DIAGNOSIS — J449 Chronic obstructive pulmonary disease, unspecified: Secondary | ICD-10-CM

## 2018-11-08 NOTE — Patient Instructions (Signed)
Continue inhalers as prescribed Continue oxygen as prescribed CT chest reviewed with the patient Will need CT chest in 6 months for interval assessment  Patient has intermittent chest pain and palpitations at rest with shortness of breath at rest I have advised patient to follow-up with cardiology

## 2018-11-08 NOTE — Progress Notes (Signed)
Name: Tyler [name] MRN:   [id number] DOB:   1955/08/15             CONSULTATION DATE:  08/02/2017   REFERRING MD :  Dr. Jacqulyn Ducking VISIT    In the setting of the current Covid19 crisis, you are scheduled for a  visit with me on 11/08/2018  Just as we do with many in-office visits, in order for you to participate in this visit, we must obtain consent.   I can obtain your verbal consent now.  PATIENT AGREES AND CONFIRMS -YES This Visit has Audio and Visual Capabilities for optimal patient care experience   Evaluation Performed:  Follow-up visit  This visit type was conducted due to national recommendations for restrictions regarding the COVID-19 Pandemic (e.g. social distancing).  This format is felt to be most appropriate for this patient at this time.  All issues noted in this document were discussed and addressed.      Virtual Visit Via VIDEO  I connected with patient on 11/08/2018 by telephone and verified that I am speaking with the correct person using two identifiers.   I discussed the limitations, risks, security and privacy concerns of performing an evaluation and management service by telephone and the availability of in person appointments. I also discussed with the patient that there may be a patient responsible charge related to this service. The patient expressed understanding and agreed to proceed.   Location of the patient: Home Location of provider: Office Participating persons: Patient and provider only     BRIEF PATIENT DESCRIPTION:  63 yo old male admitted with 05/22 with acute on chronic hypercapnic hypoxic respiratory failure secondary to AECOPD   STUDIES:  CXR 10/21/16 I have Independently reviewed images of  cxr   on 08/02/2017 Interpretation: No evidence of opacifications no effusions   PATIENT PROFILE This is a 63 yo male with a PMH of Cardiac Pacemaker, PVD, HTN, Myocardial Infarction, CAD, COPD, Chronic Systolic CHF, Atrial  Fibrillation, and AICD.    He presented to [facility] ER 05/22 with c/o worsening shortness of breath with chest COPD exacerbation Hosp course-Steroids, ABX and BD therapy patient discharged home with oxygen  Patient saw Dr. 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  Mother died of lung cancer Father died of kidney cancer    CHIEF COMPLAINT:  Follow up COPD CHRONIC SOB AND DOE  HPI Chronic SOB and DOE No wheezing At rest patient is SOB Intermittent chest pain at rest +palpitations Uses 3 L [place] 24/7  No signs of exacerbation or Infection Now wheelchair bound    Review of Systems:  Gen:  Denies  fever, sweats, chills weigh loss  HEENT: Denies blurred vision, double vision, ear pain, eye pain, hearing loss, nose bleeds, sore throat Cardiac:  No dizziness, chest pain or heaviness, chest tightness,edema, No JVD Resp:   + cough, -sputum production, +shortness of breath,+wheezing, -hemoptysis,  Gi: Denies swallowing difficulty, stomach pain, nausea or vomiting, diarrhea, constipation, bowel incontinence Gu:  Denies bladder  incontinence, burning urine Ext:   +swelling Skin: Denies  skin rash, easy bruising or bleeding or hives Endoc:  Denies polyuria, polydipsia , polyphagia or weight change Psych:   Denies depression, insomnia or hallucinations  Other:  All other systems negative    Physical Examination:  Well nourished, well developed male in no acute distress. On oxygen No Obvious Respiratory Distress noted EOMI intact, NO obvious oral  lesions Facial Skin intact-no facial rash noted CN 3-12 intact   Insight, judgment intact. -depression -anxiety   ASSESSMENT / PLAN:  63 year old pleasant white male follow-up today for multiple medical problems including very severe COPD end-stage with chronic hypoxic respiratory failure from COPD with a history of CHF and chronic atrial fibrillation pacemaker with hypertension coronary artery disease and AICD with a history of MI   Dyspnea on exertion and shortness of breath related to multifactorial etiologies including CHF and severe COPD with deconditioned state  COPD severe Gold stage D Continue inhalers as prescribed Currently on Advair and Spiriva inhaler therapy COPD seems to be stable at this time No acute decompensation noted on video No indication for antibiotics or prednisone at this time I have relayed to the patient that if he does not tolerate inhaler therapy he will need to convert to nebulized therapy   Hypoxic respiratory failure from COPD and CHF Patient uses and needs oxygen therapy for survival Patient benefits from therapy  Extensive smoking history with severe COPD Patient has had bilateral subcentimeter nodules Largest nodule is 4 mm in the right upper lobe This has been stable over the last 12 months I have reviewed the current CT chest June 2020 No significant changes from previous CT chest in October 2019 Follow-up CT chest in 6 months  A. fib with CHF systolic heart failure Recommend follow-up cardiology as scheduled Continue Lasix as tolerated   COVID-19 EDUCATION: The signs and symptoms of COVID-19 were discussed with the patient and how to seek care for testing.  The importance of social distancing was discussed today. Hand Washing Techniques and avoid touching face was advised.  MEDICATION ADJUSTMENTS/LABS AND TESTS ORDERED:  CURRENT MEDICATIONS REVIEWED AT LENGTH WITH PATIENT TODAY   Patient satisfied with Plan of action and  management. All questions answered Follow up in 6 months  Total time spent 27 minutes  Corrin Parker, M.D.  [facility]  Medical Director [place] Director [facility] Cardio-Pulmonary Department

## 2018-11-12 ENCOUNTER — Other Ambulatory Visit: Payer: Self-pay | Admitting: Internal Medicine

## 2018-11-18 DIAGNOSIS — I70219 Atherosclerosis of native arteries of extremities with intermittent claudication, unspecified extremity: Secondary | ICD-10-CM | POA: Diagnosis not present

## 2018-11-18 DIAGNOSIS — J431 Panlobular emphysema: Secondary | ICD-10-CM | POA: Diagnosis not present

## 2018-11-18 DIAGNOSIS — D6851 Activated protein C resistance: Secondary | ICD-10-CM | POA: Diagnosis not present

## 2018-11-18 DIAGNOSIS — I739 Peripheral vascular disease, unspecified: Secondary | ICD-10-CM | POA: Diagnosis not present

## 2018-11-18 DIAGNOSIS — I482 Chronic atrial fibrillation, unspecified: Secondary | ICD-10-CM | POA: Diagnosis not present

## 2018-11-18 DIAGNOSIS — M5000 Cervical disc disorder with myelopathy, unspecified cervical region: Secondary | ICD-10-CM | POA: Diagnosis not present

## 2018-11-18 DIAGNOSIS — L03114 Cellulitis of left upper limb: Secondary | ICD-10-CM | POA: Diagnosis not present

## 2018-11-18 DIAGNOSIS — F320 Major depressive disorder, single episode, mild: Secondary | ICD-10-CM | POA: Diagnosis not present

## 2018-11-23 DIAGNOSIS — J441 Chronic obstructive pulmonary disease with (acute) exacerbation: Secondary | ICD-10-CM | POA: Diagnosis not present

## 2018-12-08 DIAGNOSIS — I10 Essential (primary) hypertension: Secondary | ICD-10-CM | POA: Diagnosis not present

## 2018-12-08 DIAGNOSIS — I5022 Chronic systolic (congestive) heart failure: Secondary | ICD-10-CM | POA: Diagnosis not present

## 2018-12-08 DIAGNOSIS — I482 Chronic atrial fibrillation, unspecified: Secondary | ICD-10-CM | POA: Diagnosis not present

## 2018-12-08 DIAGNOSIS — I2510 Atherosclerotic heart disease of native coronary artery without angina pectoris: Secondary | ICD-10-CM | POA: Diagnosis not present

## 2018-12-19 DIAGNOSIS — I2510 Atherosclerotic heart disease of native coronary artery without angina pectoris: Secondary | ICD-10-CM | POA: Diagnosis not present

## 2018-12-19 DIAGNOSIS — I5022 Chronic systolic (congestive) heart failure: Secondary | ICD-10-CM | POA: Diagnosis not present

## 2018-12-20 DIAGNOSIS — I5022 Chronic systolic (congestive) heart failure: Secondary | ICD-10-CM | POA: Diagnosis not present

## 2018-12-20 DIAGNOSIS — I2510 Atherosclerotic heart disease of native coronary artery without angina pectoris: Secondary | ICD-10-CM | POA: Diagnosis not present

## 2018-12-23 DIAGNOSIS — I2510 Atherosclerotic heart disease of native coronary artery without angina pectoris: Secondary | ICD-10-CM | POA: Diagnosis not present

## 2018-12-23 DIAGNOSIS — E782 Mixed hyperlipidemia: Secondary | ICD-10-CM | POA: Diagnosis not present

## 2018-12-23 DIAGNOSIS — I482 Chronic atrial fibrillation, unspecified: Secondary | ICD-10-CM | POA: Diagnosis not present

## 2018-12-23 DIAGNOSIS — I5022 Chronic systolic (congestive) heart failure: Secondary | ICD-10-CM | POA: Diagnosis not present

## 2018-12-24 DIAGNOSIS — J441 Chronic obstructive pulmonary disease with (acute) exacerbation: Secondary | ICD-10-CM | POA: Diagnosis not present

## 2018-12-26 ENCOUNTER — Telehealth: Payer: Self-pay | Admitting: Primary Care

## 2018-12-26 NOTE — Telephone Encounter (Signed)
T/c to schedule palliative medicine f/u. Pt states he would like a telemedicine appt in 1 month. Appt made.

## 2019-01-09 DIAGNOSIS — I482 Chronic atrial fibrillation, unspecified: Secondary | ICD-10-CM | POA: Diagnosis not present

## 2019-01-20 ENCOUNTER — Encounter (INDEPENDENT_AMBULATORY_CARE_PROVIDER_SITE_OTHER): Payer: Medicare HMO

## 2019-01-20 ENCOUNTER — Ambulatory Visit (INDEPENDENT_AMBULATORY_CARE_PROVIDER_SITE_OTHER): Payer: Medicare HMO | Admitting: Vascular Surgery

## 2019-01-20 DIAGNOSIS — I5022 Chronic systolic (congestive) heart failure: Secondary | ICD-10-CM | POA: Diagnosis not present

## 2019-01-20 DIAGNOSIS — I482 Chronic atrial fibrillation, unspecified: Secondary | ICD-10-CM | POA: Diagnosis not present

## 2019-01-20 DIAGNOSIS — I2510 Atherosclerotic heart disease of native coronary artery without angina pectoris: Secondary | ICD-10-CM | POA: Diagnosis not present

## 2019-01-24 ENCOUNTER — Ambulatory Visit (INDEPENDENT_AMBULATORY_CARE_PROVIDER_SITE_OTHER): Payer: Medicare HMO | Admitting: Vascular Surgery

## 2019-01-24 ENCOUNTER — Encounter (INDEPENDENT_AMBULATORY_CARE_PROVIDER_SITE_OTHER): Payer: Self-pay

## 2019-01-24 ENCOUNTER — Ambulatory Visit (INDEPENDENT_AMBULATORY_CARE_PROVIDER_SITE_OTHER): Payer: Medicare HMO

## 2019-01-24 ENCOUNTER — Other Ambulatory Visit: Payer: Self-pay

## 2019-01-24 ENCOUNTER — Telehealth: Payer: Self-pay | Admitting: Primary Care

## 2019-01-24 DIAGNOSIS — I739 Peripheral vascular disease, unspecified: Secondary | ICD-10-CM

## 2019-01-24 DIAGNOSIS — J441 Chronic obstructive pulmonary disease with (acute) exacerbation: Secondary | ICD-10-CM | POA: Diagnosis not present

## 2019-01-24 NOTE — Telephone Encounter (Signed)
T/c to remind for appt Friday. We will do telemed. At 1230 pm. Patient in agreement.

## 2019-01-27 ENCOUNTER — Other Ambulatory Visit: Payer: Medicare HMO | Admitting: Primary Care

## 2019-01-27 ENCOUNTER — Telehealth: Payer: Self-pay | Admitting: Primary Care

## 2019-01-27 ENCOUNTER — Other Ambulatory Visit: Payer: Self-pay

## 2019-01-27 NOTE — Telephone Encounter (Signed)
Received return call from patient and we have rescheduled today's f/u visit to 02/01/19 @ 12 Noon.

## 2019-01-27 NOTE — Telephone Encounter (Signed)
Ralene Bathe, NP had a telehealth appointment with patient today and she was able to complete the call due to phone reception in [place].  She requested that I call patient to reschedule.  Called patient with no answer, left message with reason for call and left my contact number to reschedule.

## 2019-01-31 ENCOUNTER — Other Ambulatory Visit: Payer: Self-pay

## 2019-01-31 ENCOUNTER — Ambulatory Visit (INDEPENDENT_AMBULATORY_CARE_PROVIDER_SITE_OTHER): Payer: Medicare HMO | Admitting: Vascular Surgery

## 2019-01-31 ENCOUNTER — Encounter (INDEPENDENT_AMBULATORY_CARE_PROVIDER_SITE_OTHER): Payer: Self-pay | Admitting: Vascular Surgery

## 2019-01-31 VITALS — BP 114/78 | HR 79 | Resp 16 | Ht 73.0 in | Wt 153.0 lb

## 2019-01-31 DIAGNOSIS — L7634 Postprocedural seroma of skin and subcutaneous tissue following other procedure: Secondary | ICD-10-CM

## 2019-01-31 DIAGNOSIS — I7025 Atherosclerosis of native arteries of other extremities with ulceration: Secondary | ICD-10-CM

## 2019-01-31 DIAGNOSIS — I5022 Chronic systolic (congestive) heart failure: Secondary | ICD-10-CM | POA: Diagnosis not present

## 2019-01-31 DIAGNOSIS — I495 Sick sinus syndrome: Secondary | ICD-10-CM | POA: Diagnosis not present

## 2019-01-31 DIAGNOSIS — I10 Essential (primary) hypertension: Secondary | ICD-10-CM

## 2019-01-31 NOTE — Assessment & Plan Note (Signed)
Seroma has finally resolved.

## 2019-01-31 NOTE — Assessment & Plan Note (Signed)
The patient has had some reduction in his left lower extremity perfusion and has known severe peripheral arterial disease.  We discussed options today.  Given the fact that he is scheduled to see a cardiologist to consider a new pacemaker that may improve his perfusion in a few weeks, I think would be reasonable to allow him to see if that improves the situation somewhat.  He will be very difficult to get further revascularization on, but if his wound worsens or he does not see improvement with his cardiac issues we would have to consider another left leg angiogram.  For now, I will plan about a 2 to 38-month follow-up.

## 2019-01-31 NOTE — Assessment & Plan Note (Signed)
His most recent echocardiogram showed a reduction in his ejection fraction down to about 30%.  He is actually scheduled to meet with an electrophysiology specialist to discuss potential dual-chamber pacemaker in about 3 to 4 weeks.  Improvement in his cardiac function could certainly increases his chances of wound healing by improving his perfusion distally.

## 2019-01-31 NOTE — Patient Instructions (Signed)
Peripheral Vascular Disease  Peripheral vascular disease (PVD) is a disease of the blood vessels that are not part of your heart and brain. A simple term for PVD is poor circulation. In most cases, PVD narrows the blood vessels that carry blood from your heart to the rest of your body. This can reduce the supply of blood to your arms, legs, and internal organs, like your stomach or kidneys. However, PVD most often affects a person's lower legs and feet. Without treatment, PVD tends to get worse. PVD can also lead to acute ischemic limb. This is when an arm or leg suddenly cannot get enough blood. This is a medical emergency. Follow these instructions at home: Lifestyle  Do not use any products that contain nicotine or tobacco, such as cigarettes and e-cigarettes. If you need help quitting, ask your doctor.  Lose weight if you are overweight. Or, stay at a healthy weight as told by your doctor.  Eat a diet that is low in fat and cholesterol. If you need help, ask your doctor.  Exercise regularly. Ask your doctor for activities that are right for you. General instructions  Take over-the-counter and prescription medicines only as told by your doctor.  Take good care of your feet: ? Wear comfortable shoes that fit well. ? Check your feet often for any cuts or sores.  Keep all follow-up visits as told by your doctor This is important. Contact a doctor if:  You have cramps in your legs when you walk.  You have leg pain when you are at rest.  You have coldness in a leg or foot.  Your skin changes.  You are unable to get or have an erection (erectile dysfunction).  You have cuts or sores on your feet that do not heal. Get help right away if:  Your arm or leg turns cold, numb, and blue.  Your arms or legs become red, warm, swollen, painful, or numb.  You have chest pain.  You have trouble breathing.  You suddenly have weakness in your face, arm, or leg.  You become very  confused or you cannot speak.  You suddenly have a very bad headache.  You suddenly cannot see. Summary  Peripheral vascular disease (PVD) is a disease of the blood vessels.  A simple term for PVD is poor circulation. Without treatment, PVD tends to get worse.  Treatment may include exercise, low fat and low cholesterol diet, and quitting smoking. This information is not intended to replace advice given to you by your health care provider. Make sure you discuss any questions you have with your health care provider. Document Released: 08/05/2009 Document Revised: 04/23/2017 Document Reviewed: 06/18/2016 Elsevier Patient Education  2020 [employer]  

## 2019-01-31 NOTE — Progress Notes (Signed)
MRN : [id number]  Tyler [name] is a 63 y.o. (February 12, 1956) male who presents with chief complaint of  Chief Complaint  Patient presents with   Follow-up    ultrasound  .  History of Present Illness: Patient returns today in follow up of his PAD with ulceration.  He has had a persistent wound on left great toe now for almost a year.  He has a litany of medical issues causing poor wound healing including severe COPD on oxygen and reduced cardiac function with an ejection fraction about 30%, but he also has severe peripheral arterial disease having had multiple previous interventions on that left leg.  He has already lost the right leg above the knee.  He came in last week but I had to leave for an emergency surgery and had noninvasive studies.  His ABIs are noncompressible but his digit pressure has reduced from his previous studies.  He returns today to discuss options.  Current Outpatient Medications  Medication Sig Dispense Refill   acetaminophen (TYLENOL) 500 MG tablet Take 1,000 mg by mouth daily as needed for moderate pain or headache.     ALPRAZolam (XANAX) 1 MG tablet Take 1 mg by mouth 2 (two) times daily.      aspirin EC 81 MG tablet Take 1 tablet (81 mg total) by mouth daily. (Patient taking differently: Take 81 mg by mouth daily after supper. ) 150 tablet 2   citalopram (CELEXA) 20 MG tablet Take 20 mg by mouth at bedtime.   11   docusate sodium (COLACE) 100 MG capsule Take 100 mg by mouth at bedtime as needed (for constipation.).      fluticasone (FLONASE) 50 MCG/ACT nasal spray Place 2 sprays into both nostrils daily.     fluticasone-salmeterol (ADVAIR HFA) 115-21 MCG/ACT inhaler USE 2 INHALATIONS ORALLY EVERY 12 HOURS 36 Inhaler 5   furosemide (LASIX) 20 MG tablet Take 20 mg by mouth every other day as needed (for fluid retention.).      guaiFENesin (MUCINEX) 600 MG 12 hr tablet Take 600 mg by mouth 2 (two) times daily.     ipratropium (ATROVENT) 0.03 % nasal spray  Place 2 sprays into both nostrils 2 (two) times daily.      Ipratropium-Albuterol (COMBIVENT RESPIMAT) 20-100 MCG/ACT AERS respimat Inhale 1 puff into the lungs every 4 (four) hours.      levalbuterol (XOPENEX) 1.25 MG/3ML nebulizer solution Take 1.25 mg (3 mLs total) by nebulization every 4 (four) hours. (Patient taking differently: Take 3 mLs by nebulization every 4 (four) hours as needed for wheezing or shortness of breath ((typically twice daily)). ) 72 mL 12   loratadine (CLARITIN) 10 MG tablet Take 10 mg by mouth at bedtime.     lovastatin (MEVACOR) 20 MG tablet Take 20 mg by mouth at bedtime.     metoprolol succinate (TOPROL-XL) 25 MG 24 hr tablet Take 25 mg by mouth daily.     nitroGLYCERIN (NITROSTAT) 0.4 MG SL tablet Place 0.4 mg under the tongue every 5 (five) minutes as needed for chest pain.     nystatin cream (MYCOSTATIN) APPLY TO AFFECTED AREA TWICE A DAY  0   oxyCODONE-acetaminophen (PERCOCET) 7.5-325 MG tablet Take 1 tablet by mouth every 4 (four) hours as needed for severe pain. 30 tablet 0   pantoprazole (PROTONIX) 40 MG tablet Take 40 mg by mouth daily before breakfast.      SPIRIVA RESPIMAT 1.25 MCG/ACT AERS INHALE 2 PUFFS BY MOUTH INTO THE LUNGS  DAILY 4 g 6   tamsulosin (FLOMAX) 0.4 MG CAPS capsule TAKE 1 CAPSULE (0.4 MG TOTAL) BY MOUTH ONCE DAILY TAKE 30 MINUTES AFTER SAME MEAL EACH DAY.  11   vitamin B-12 (CYANOCOBALAMIN) 1000 MCG tablet Take 1,000 mcg by mouth daily.     warfarin (COUMADIN) 5 MG tablet Take 5 mg by mouth at bedtime.      Wound Dressings (RESTORE WOUND CARE DRESSING) PADS Apply topically.     azithromycin (ZITHROMAX Z-PAK) 250 MG tablet Take 2 tablets on Day 1 and then 1 tablet daily till gone. (Patient not taking: Reported on 07/19/2018) 6 each 0   ENTRESTO 24-26 MG Take 1 tablet by mouth 2 (two) times daily.     predniSONE (DELTASONE) 20 MG tablet Take 1 tablet (20 mg total) by mouth daily with breakfast. 10 days (Patient not taking:  Reported on 07/19/2018) 20 tablet 0   ramipril (ALTACE) 10 MG capsule Take 10 mg by mouth at bedtime.      No current facility-administered medications for this visit.     Past Medical History:  Diagnosis Date   AICD (automatic cardioverter/defibrillator) present    Arthritis    Atrial fibrillation ([place])    Cervical spinal stenosis    with neuropathy   CHF (congestive heart failure) (HCC)    COPD (chronic obstructive pulmonary disease) (HCC)    Coronary artery disease    Depression    Dyspnea    GERD (gastroesophageal reflux disease)    Hypertension    Myocardial infarction ([place])    Peripheral vascular disease ([place])    Presence of permanent cardiac pacemaker     Past Surgical History:  Procedure Laterality Date   ABOVE KNEE LEG AMPUTATION Right    after below the knee amputation    APPLICATION OF WOUND VAC Left 01/12/2018   Procedure: APPLICATION OF WOUND VAC;  Surgeon: Algernon Huxley, MD;  Location: [facility];  Service: Vascular;  Laterality: Left;   BELOW KNEE LEG AMPUTATION Right    CATARACT EXTRACTION, BILATERAL Bilateral    ENDARTERECTOMY FEMORAL Left 08/11/2017   Procedure: ENDARTERECTOMY FEMORAL;  Surgeon: Algernon Huxley, MD;  Location: [facility];  Service: Vascular;  Laterality: Left;   HEMATOMA EVACUATION Left 01/12/2018   Procedure: EVACUATION HEMATOMA ( DRAINING OF SEROMA);  Surgeon: Algernon Huxley, MD;  Location: [facility];  Service: Vascular;  Laterality: Left;   IMPLANTABLE CARDIOVERTER DEFIBRILLATOR (ICD) GENERATOR CHANGE Left 02/10/2017   Procedure: ICD GENERATOR CHANGE;  Surgeon: Isaias Cowman, MD;  Location: [facility];  Service: Cardiovascular;  Laterality: Left;   INSERT / REPLACE / REMOVE PACEMAKER     LOWER EXTREMITY ANGIOGRAPHY Left 06/07/2017   Procedure: LOWER EXTREMITY ANGIOGRAPHY;  Surgeon: Algernon Huxley, MD;  Location: [facility];  Service: Cardiovascular;  Laterality: Left;   LOWER EXTREMITY INTERVENTION  06/07/2017    Procedure: LOWER EXTREMITY INTERVENTION;  Surgeon: Algernon Huxley, MD;  Location: [facility];  Service: Cardiovascular;;   PERIPHERAL VASCULAR CATHETERIZATION Left 12/09/2015   Procedure: Lower Extremity Angiography;  Surgeon: Algernon Huxley, MD;  Location: [facility];  Service: Cardiovascular;  Laterality: Left;   PERIPHERAL VASCULAR CATHETERIZATION  12/09/2015   Procedure: Lower Extremity Intervention;  Surgeon: Algernon Huxley, MD;  Location: [facility];  Service: Cardiovascular;;   TONSILLECTOMY     Social History        Tobacco Use   Smoking status: Former Smoker    Packs/day: 1.00  Years: 42.00    Pack years: 42.00    Types: Cigarettes    Last attempt to quit: 09/23/2013    Years since quitting: 4.6   Smokeless tobacco: Never Used  Substance Use Topics   Alcohol use: No   Drug use: No     Family History      Family History  Problem Relation Age of Onset   Cancer Mother    Cancer Father    Heart disease Father          Allergies  Allergen Reactions   Apixaban Rash   Rivaroxaban Rash     REVIEW OF SYSTEMS(Negative unless checked)  Constitutional: [] ??Weight loss [] ??Fever [] ??Chills Cardiac: [] ??Chest pain [] ??Chest pressure [] ??Palpitations [] ??Shortness of breath when laying flat [] ??Shortness of breath at rest [] ??Shortness of breath with exertion. Vascular: [] ??Pain in legs with walking [] ??Pain in legs at rest [] ??Pain in legs when laying flat [] ??Claudication [] ??Pain in feet when walking [] ??Pain in feet at rest [] ??Pain in feet when laying flat [] ??History of DVT [] ??Phlebitis [] ??Swelling in legs [] ??Varicose veins [x] ??Non-healing ulcers Pulmonary: [x] ??Uses home oxygen [] ??Productive cough [] ??Hemoptysis [] ??Wheeze [x] ??COPD [] ??Asthma Neurologic: [] ??Dizziness [] ??Blackouts [] ??Seizures [] ??History of stroke [] ??History of TIA  [] ??Aphasia [] ??Temporary blindness [] ??Dysphagia [] ??Weakness or numbness in arms [] ??Weakness or numbness in legs Musculoskeletal: [] ??Arthritis [] ??Joint swelling [] ??Joint pain [x] ??Low back pain Hematologic: [] ??Easy bruising [] ??Easy bleeding [] ??Hypercoagulable state [] ??Anemic  Gastrointestinal: [] ??Blood in stool [] ??Vomiting blood [] ??Gastroesophageal reflux/heartburn [] ??Abdominal pain Genitourinary: [] ??Chronic kidney disease [] ??Difficult urination [] ??Frequent urination [] ??Burning with urination [] ??Hematuria Skin: [] ??Rashes [x] ??Ulcers [x] ??Wounds Psychological: [] ??History of anxiety [] ??History of major depression.    Physical Examination  BP 114/78 (BP Location: Right Arm)    Pulse 79    Resp 16    Ht 6\' 1"  (1.854 m)    Wt 153 lb (69.4 kg)    BMI 20.19 kg/m  Gen:  WD/WN, NAD Head: [place]/AT, No temporalis wasting. Ear/Nose/Throat: Hearing grossly intact, nares w/o erythema or drainage Eyes: Conjunctiva clear. Sclera non-icteric Neck: Supple.  Trachea midline Pulmonary:  Good air movement on supplemental oxygen, respirations mildly labored at rest Cardiac: RRR, no JVD Vascular:  Vessel Right Left  Radial Palpable Palpable                          PT  not palpable  1+ palpable  DP  not palpable  not palpable   Gastrointestinal: soft, non-tender/non-distended. Musculoskeletal: M/S 5/5 throughout.  No deformity or atrophy.  Uses a wheelchair.  Right AKA.  Small superficial ulceration on the top of the left great toe.  No edema. Neurologic: Sensation grossly intact in extremities.  Symmetrical.  Speech is fluent.  Psychiatric: Judgment intact, Mood & affect appropriate for pt's clinical situation. Dermatologic: Left great toe ulceration as above       Labs No results found for this or any previous visit (from the past 2160 hour(s)).  Radiology Vas [place] Burnard Bunting With/wo Tbi  Result Date: 01/24/2019 LOWER EXTREMITY  DOPPLER STUDY Indications: Claudication, rest pain, and peripheral artery disease.  Comparison Study: 07/19/2018 Performing Technologist: Almira Coaster RVS  Examination Guidelines: A complete evaluation includes at minimum, Doppler waveform signals and systolic blood pressure reading at the level of bilateral brachial, anterior tibial, and posterior tibial arteries, when vessel segments are accessible. Bilateral testing is considered an integral part of a complete examination. Photoelectric Plethysmograph (PPG) waveforms and toe systolic pressure readings are included as required and additional duplex testing as needed. Limited examinations for reoccurring  indications may be performed as noted.  ABI Findings: +--------+------------------+-----+--------+--------+  Right    Rt Pressure (mmHg) Index Waveform Comment   +--------+------------------+-----+--------+--------+  Brachial 164                                         +--------+------------------+-----+--------+--------+ +---------+------------------+-----+----------+-----------------------------+  Left      Lt Pressure (mmHg) Index Waveform   Comment                        +---------+------------------+-----+----------+-----------------------------+  Brachial  155                                                                +---------+------------------+-----+----------+-----------------------------+  ATA       152                0.93  monophasic                                +---------+------------------+-----+----------+-----------------------------+  PTA       250                1.52  monophasic                                +---------+------------------+-----+----------+-----------------------------+  Great Toe                                     Non Healing sore on Great Toe  +---------+------------------+-----+----------+-----------------------------+ +-------+-----------+-----------+------------+------------+  ABI/TBI Today's ABI Today's  TBI Previous ABI Previous TBI  +-------+-----------+-----------+------------+------------+  Left    >1.0 [place]                 1.48 [place]      .36           +-------+-----------+-----------+------------+------------+ OES Findings: +----------+---------------+--------+-------+  Right Toes Pressure (mmHg) Waveform Comment  +----------+---------------+--------+-------+  1st Digit                           Rt BKA   +----------+---------------+--------+-------+  2nd Digit                           Rt BKA   +----------+---------------+--------+-------+  3rd Digit                           Rt BKA   +----------+---------------+--------+-------+  4th Digit                           Rt BKA   +----------+---------------+--------+-------+  5th Digit                           Rt BKA   +----------+---------------+--------+-------+  +---------+---------------+--------+-------+  Left Toes Pressure (mmHg) Waveform Comment  +---------+---------------+--------+-------+  1st Digit 0  Absent            +---------+---------------+--------+-------+  2nd Digit 0               Absent            +---------+---------------+--------+-------+  3rd Digit 0               Absent            +---------+---------------+--------+-------+  4th Digit 1               Absent            +---------+---------------+--------+-------+  5th Digit 1               Absent            +---------+---------------+--------+-------+   Left ABIs appear essentially unchanged compared to prior study on 07/19/2018.  Summary: Right: Rt BKA. Left: Resting left ankle-brachial index indicates noncompressible left lower extremity arteries. ABIs are unreliable.  *See table(s) above for measurements and observations.  Electronically signed by Leotis Pain MD on 01/24/2019 at 3:45:18 PM.    Final     Assessment/Plan Hx of AKA (above knee amputation), right ([place]) Well healed  Essential hypertension, benign blood pressure control important in reducing the progression of  atherosclerotic disease. On appropriate oral medications.  Chronic systolic CHF (congestive heart failure) (HCC) His most recent echocardiogram showed a reduction in his ejection fraction down to about 30%.  He is actually scheduled to meet with an electrophysiology specialist to discuss potential dual-chamber pacemaker in about 3 to 4 weeks.  Improvement in his cardiac function could certainly increases his chances of wound healing by improving his perfusion distally.  Postoperative seroma of subcutaneous tissue after non-dermatologic procedure Seroma has finally resolved.  Atherosclerosis of native arteries of the extremities with ulceration ([place]) The patient has had some reduction in his left lower extremity perfusion and has known severe peripheral arterial disease.  We discussed options today.  Given the fact that he is scheduled to see a cardiologist to consider a new pacemaker that may improve his perfusion in a few weeks, I think would be reasonable to allow him to see if that improves the situation somewhat.  He will be very difficult to get further revascularization on, but if his wound worsens or he does not see improvement with his cardiac issues we would have to consider another left leg angiogram.  For now, I will plan about a 2 to 21-month follow-up.    Leotis Pain, MD  01/31/2019 2:09 PM    This note was created with Dragon medical transcription system.  Any errors from dictation are purely unintentional

## 2019-02-01 ENCOUNTER — Other Ambulatory Visit: Payer: Medicare HMO | Admitting: Primary Care

## 2019-02-01 ENCOUNTER — Telehealth (INDEPENDENT_AMBULATORY_CARE_PROVIDER_SITE_OTHER): Payer: Self-pay | Admitting: Vascular Surgery

## 2019-02-01 DIAGNOSIS — Z515 Encounter for palliative care: Secondary | ICD-10-CM

## 2019-02-01 NOTE — Telephone Encounter (Signed)
Based upon the office visit note from when he saw Dr. Lucky Cowboy yesterday, I don't believe that an antibiotic would help.  This ulceration has been persistent for about a year and generally when associated with an infection it would have grown worse and likely required antibiotics prior to now.  I believe that we are concerned for issues with blood flow which may improve if he has a pacemaker placed.

## 2019-02-01 NOTE — Telephone Encounter (Signed)
Patient has been informed with Tyler Ditch NP medical advice and verbalize understanding

## 2019-02-01 NOTE — Progress Notes (Signed)
[facility] Palliative Care Consult Note Telephone: [phone]  Fax: [phone]  TELEHEALTH VISIT STATEMENT Due to the COVID-19 crisis, this visit was done via telemedicine from my office. It was initiated and consented to by this patient and/or family.  PATIENT NAME: Tyler [name] DOB: 11/25/1955 MRN: [id number]  PRIMARY CARE PROVIDER:   [name], MD,  [place] [address] [phone]  REFERRING PROVIDER:  [name], MD [place],  [place] [address] [phone]  RESPONSIBLE PARTY:   Extended Emergency Contact Information Primary Emergency Contact: Tyler [name], Tyler [name] Address: [place], [place] [address] Tyler [name] of [place] Phone: [phone] Mobile Phone: [phone] Relation: Spouse   ASSESSMENT AND RECOMMENDATIONS:   1. Advance Care Planning/Goals of Care: Goals include to maximize quality of life and symptom management. No directives on file. We will discuss at next meeting in 2 weeks.  2. Symptom Management:   Cardiac function: Has dropped from 35 to 30% and will see EP in October. May be getting better pacemaker which will affect atria better. Current technology is from 2005. Also has defibrillator (AICD) and we discussed scenarios when he may want it deactivated.  Activity tolerance: States he does not do "anything" due to poor activity tolerance. He uses a wheel chair, and a few years ago gave up crutches due to disc disease. Does not have good sensation in hands due to stenosis. Also does not have good sensation in foot either.  Does not use prosthesis and  Has R AKA. He now uses a transport chair and pivot transfers. States he gets breathless on a transfer and needs some rest to recoup. Quit smoking 2014 and has smoked 42 years, 84 pack years. We discussed vascular disease and smoking.  Wound care: States he has had a 0.5 cm wound area on left foot for a  year. Wife puts hydrogen peroxide, bacitracin, guaze and coban. State it is very red. I discussed using saline and not H2O2 as it is tissue toxic, and to use a foam dressing. It is situated at dorsal side of toe at the first joint. I will check on it when I go in 2 weeks.    3. Family /Caregiver/Community Supports: [place] Has visitors and friends who come by. Is observing covid precautions.  4. Cognitive / Functional decline: Alert and oriented x4. Is functional but limited by endurance.  5. Follow up Palliative Care Visit: Palliative care will continue to follow for goals of care clarification and symptom management. Has not been seen for a year by palliative. Return 2 weeks or prn.  I spent 60 minutes providing this consultation,  from 1230 to 1330. More than 50% of the time in this consultation was spent coordinating communication.   HISTORY OF PRESENT ILLNESS:  Tyler [name] is a 63 y.o. year old male with multiple medical problems including PAD, CHF, PVD, wound, poor endurance, COPD. Palliative Care was asked to follow this patient by consultation request of [name], MD to help address advance care planning and goals of care. This is a follow up visit.  CODE STATUS: TBD  PPS: 40% HOSPICE ELIGIBILITY/DIAGNOSIS: TBD  PAST MEDICAL HISTORY:  Past Medical History:  Diagnosis Date  . AICD (automatic cardioverter/defibrillator) present   . Arthritis   . Atrial fibrillation ([place])   . Cervical spinal stenosis  with neuropathy  . CHF (congestive heart failure) ([place])   . COPD (chronic obstructive pulmonary disease) ([place])   . Coronary artery disease   . Depression   . Dyspnea   . GERD (gastroesophageal reflux disease)   . Hypertension   . Myocardial infarction ([place])   . Peripheral vascular disease ([place])   . Presence of permanent cardiac pacemaker     SOCIAL HX:  Social History   Tobacco Use  . Smoking status: Former Smoker     Packs/day: 1.00    Years: 42.00    Pack years: 42.00    Types: Cigarettes    Quit date: 09/23/2013    Years since quitting: 5.3  . Smokeless tobacco: Never Used  Substance Use Topics  . Alcohol use: No    ALLERGIES:  Allergies  Allergen Reactions  . Apixaban Rash  . Rivaroxaban Rash     PERTINENT MEDICATIONS:  Outpatient Encounter Medications as of 02/01/2019  Medication Sig  . acetaminophen (TYLENOL) 500 MG tablet Take 1,000 mg by mouth daily as needed for moderate pain or headache.  . ALPRAZolam (XANAX) 1 MG tablet Take 1 mg by mouth 2 (two) times daily.   Marland Kitchen aspirin EC 81 MG tablet Take 1 tablet (81 mg total) by mouth daily. (Patient taking differently: Take 81 mg by mouth daily after supper. )  . azithromycin (ZITHROMAX Z-PAK) 250 MG tablet Take 2 tablets on Day 1 and then 1 tablet daily till gone. (Patient not taking: Reported on 07/19/2018)  . citalopram (CELEXA) 20 MG tablet Take 20 mg by mouth at bedtime.   . docusate sodium (COLACE) 100 MG capsule Take 100 mg by mouth at bedtime as needed (for constipation.).   Marland Kitchen ENTRESTO 24-26 MG Take 1 tablet by mouth 2 (two) times daily.  . fluticasone (FLONASE) 50 MCG/ACT nasal spray Place 2 sprays into both nostrils daily.  . fluticasone-salmeterol (ADVAIR HFA) 115-21 MCG/ACT inhaler USE 2 INHALATIONS ORALLY EVERY 12 HOURS  . furosemide (LASIX) 20 MG tablet Take 20 mg by mouth every other day as needed (for fluid retention.).   Marland Kitchen guaiFENesin (MUCINEX) 600 MG 12 hr tablet Take 600 mg by mouth 2 (two) times daily.  Marland Kitchen ipratropium (ATROVENT) 0.03 % nasal spray Place 2 sprays into both nostrils 2 (two) times daily.   . Ipratropium-Albuterol (COMBIVENT RESPIMAT) 20-100 MCG/ACT AERS respimat Inhale 1 puff into the lungs every 4 (four) hours.   Marland Kitchen levalbuterol (XOPENEX) 1.25 MG/3ML nebulizer solution Take 1.25 mg (3 mLs total) by nebulization every 4 (four) hours. (Patient taking differently: Take 3 mLs by nebulization every 4 (four) hours as needed  for wheezing or shortness of breath ((typically twice daily)). )  . loratadine (CLARITIN) 10 MG tablet Take 10 mg by mouth at bedtime.  . lovastatin (MEVACOR) 20 MG tablet Take 20 mg by mouth at bedtime.  . metoprolol succinate (TOPROL-XL) 25 MG 24 hr tablet Take 25 mg by mouth daily.  . nitroGLYCERIN (NITROSTAT) 0.4 MG SL tablet Place 0.4 mg under the tongue every 5 (five) minutes as needed for chest pain.  Marland Kitchen nystatin cream (MYCOSTATIN) APPLY TO AFFECTED AREA TWICE A DAY  . oxyCODONE-acetaminophen (PERCOCET) 7.5-325 MG tablet Take 1 tablet by mouth every 4 (four) hours as needed for severe pain.  . pantoprazole (PROTONIX) 40 MG tablet Take 40 mg by mouth daily before breakfast.   . predniSONE (DELTASONE) 20 MG tablet Take 1 tablet (20 mg total) by mouth daily with breakfast. 10 days (Patient not taking: Reported  on 07/19/2018)  . ramipril (ALTACE) 10 MG capsule Take 10 mg by mouth at bedtime.   Marland Kitchen SPIRIVA RESPIMAT 1.25 MCG/ACT AERS INHALE 2 PUFFS BY MOUTH INTO THE LUNGS DAILY  . tamsulosin (FLOMAX) 0.4 MG CAPS capsule TAKE 1 CAPSULE (0.4 MG TOTAL) BY MOUTH ONCE DAILY TAKE 30 MINUTES AFTER SAME MEAL EACH DAY.  . vitamin B-12 (CYANOCOBALAMIN) 1000 MCG tablet Take 1,000 mcg by mouth daily.  Marland Kitchen warfarin (COUMADIN) 5 MG tablet Take 5 mg by mouth at bedtime.   . Wound Dressings (RESTORE WOUND CARE DRESSING) PADS Apply topically.   No facility-administered encounter medications on file as of 02/01/2019.     PHYSICAL EXAM / ROS:   Current and past weights: unavailable. General: NAD, frail Cardiovascular: no chest pain reported,1+ dependent L edema, per report Pulmonary: no cough, no increased SOB Abdomen: appetite fair,continent of bowel GU: denies dysuria, continent of urine MSK:  Non ambulatory, amputation due to PAD, had to have Right  AKA. Also has L stents for PAD on left Skin: left hallux wound, red and 0.5 cm by pt description. Neurological: Weakness, multiple neuropathies, loss of hand and  foot sensation.  Cyndia Skeeters DNP AGPCNP-BC

## 2019-02-10 DIAGNOSIS — I482 Chronic atrial fibrillation, unspecified: Secondary | ICD-10-CM | POA: Diagnosis not present

## 2019-02-16 DIAGNOSIS — Z515 Encounter for palliative care: Secondary | ICD-10-CM | POA: Insufficient documentation

## 2019-02-17 ENCOUNTER — Other Ambulatory Visit: Payer: Self-pay

## 2019-02-17 ENCOUNTER — Other Ambulatory Visit: Payer: Medicare HMO | Admitting: Primary Care

## 2019-02-17 DIAGNOSIS — Z515 Encounter for palliative care: Secondary | ICD-10-CM

## 2019-02-17 NOTE — Progress Notes (Signed)
[facility] Consult Note Telephone: [phone]  Fax: [phone]  PATIENT NAME: Tyler [name] [address] [place] [address] [phone] (home)  DOB: 1956-05-23 MRN: [id number]  PRIMARY CARE PROVIDER:   [name], MD, [place] [address] [phone]  REFERRING PROVIDER:  [name], MD [place],  [place] [address] [phone]  RESPONSIBLE PARTY:   Extended Emergency Contact Information Primary Emergency Contact: Tow[place], Ramsdell Address: [place], [place] [address] Johnnette Litter of [place] Phone: [phone] Mobile Phone: [phone] Relation: Spouse   ASSESSMENT AND RECOMMENDATIONS:   1. Advance Care Planning/Goals of Care: Goals include to maximize quality of life and symptom management. MOST reviewed, uploaded to [facility]. We discussed DNR and life support measures. He stated he was not ready to say  not to intervene. His wife was present and agreed with decision. Full code, full scope of treatment altho no feeding tube.  2. Symptom Management:   Dyspnea: Mr. Johnsie Cancel states dyspnea especially on exertion. He is able to conduct the interview without extreme dyspnea. He does state that his mobility  is limited by his fatigue and he needs recovery time after he goes to another room in the house to the bathroom, [employer]. He is using oxygen at 2 L and has a concentrator plus a tank filler for travel tanks. 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   Constipation: MiraLAX is recommended for establishing a good bowel program, may discontinue Colace. We discussed adding some fiber to his diet whether the liquid or chews. Also Senna will help and he could increase if needed. He states constipation is a significant problem. We discussed how some factors such as medications and mobility and food/hydration can affect  constipation.   Endurance: he is seeing Cardiology EP Soon to address his atrial fibrillation he may be getting a new pacemaker. He states his vascular surgeon wants to wait to see if his peripheral circulation can be improved with a cardiology intervention. He denies chest pains or worsening function. Wound care he has a right amputation and left great toe with a 1 cm round ulcer. We discussed possibly consulting with wound management as this ulcer has been in place for over a year. It is likely due to poor oxygen supply however I would recommend at least an assessment to see if there is a wound healing product that may help.   Mobility: hes able to use a transfer chair in the house And is able to mobilize himself states he does not use his crutches due to upper extremity numbness secondary to cervical stenosis.   3. Family /Caregiver/Community Supports: Has children but not involved, sisters in the area. Lives [place] who [place] main [place]  4. Cognitive / Functional decline: Intact cognitively, Functionally can do self feeding, needs help with bathing, transfers. Dependent in most IADLs  5. Follow up Palliative Care Visit: Palliative care will continue to follow for goals of care clarification and symptom management. Return 6 weeks or prn.  I spent 60 minutes providing this consultation,  from 0945 to 1045. More than 50% of the time in this consultation was spent coordinating communication.   HISTORY OF PRESENT ILLNESS:  Tyler [name] is a 63 y.o. year old male with multiple medical problems including spinal stenosis, Afib, OA, PAD,  PVD, depression, COPD, CHF, constipation. Palliative Care was asked to follow this patient by consultation request of [name], MD to help address advance care planning and goals of care. This is a follow up visit.  CODE STATUS: FULL CODE PPS: 40% HOSPICE ELIGIBILITY/DIAGNOSIS: TBD  PAST MEDICAL HISTORY:  Past Medical History:    Diagnosis Date   AICD (automatic cardioverter/defibrillator) present    Arthritis    Atrial fibrillation (HCC)    Cervical spinal stenosis    with neuropathy   CHF (congestive heart failure) (HCC)    COPD (chronic obstructive pulmonary disease) (HCC)    Coronary artery disease    Depression    Dyspnea    GERD (gastroesophageal reflux disease)    Hypertension    Myocardial infarction ([place])    Peripheral vascular disease (HCC)    Presence of permanent cardiac pacemaker     SOCIAL HX:  Social History   Tobacco Use   Smoking status: Former Smoker    Packs/day: 1.00    Years: 42.00    Pack years: 42.00    Types: Cigarettes    Quit date: 09/23/2013    Years since quitting: 5.4   Smokeless tobacco: Never Used  Substance Use Topics   Alcohol use: No    ALLERGIES:  Allergies  Allergen Reactions   Apixaban Rash   Rivaroxaban Rash     PERTINENT MEDICATIONS:  Outpatient Encounter Medications as of 02/17/2019  Medication Sig   acetaminophen (TYLENOL) 500 MG tablet Take 1,000 mg by mouth daily as needed for moderate pain or headache.   ALPRAZolam (XANAX) 1 MG tablet Take 1 mg by mouth 2 (two) times daily.    aspirin EC 81 MG tablet Take 1 tablet (81 mg total) by mouth daily. (Patient taking differently: Take 81 mg by mouth daily after supper. )   citalopram (CELEXA) 20 MG tablet Take 20 mg by mouth at bedtime.    docusate sodium (COLACE) 100 MG capsule Take 100 mg by mouth at bedtime as needed (for constipation.).    ENTRESTO 24-26 MG Take 1 tablet by mouth 2 (two) times daily.   fluticasone (FLONASE) 50 MCG/ACT nasal spray Place 2 sprays into both nostrils daily.   fluticasone-salmeterol (ADVAIR HFA) 115-21 MCG/ACT inhaler USE 2 INHALATIONS ORALLY EVERY 12 HOURS   furosemide (LASIX) 20 MG tablet Take 20 mg by mouth every other day as needed (for fluid retention.).    guaiFENesin (MUCINEX) 600 MG 12 hr tablet Take 600 mg by mouth 2 (two) times daily.    ipratropium (ATROVENT) 0.03 % nasal spray Place 2 sprays into both nostrils 2 (two) times daily.    Ipratropium-Albuterol (COMBIVENT RESPIMAT) 20-100 MCG/ACT AERS respimat Inhale 1 puff into the lungs every 4 (four) hours.    levalbuterol (XOPENEX) 1.25 MG/3ML nebulizer solution Take 1.25 mg (3 mLs total) by nebulization every 4 (four) hours. (Patient taking differently: Take 3 mLs by nebulization every 4 (four) hours as needed for wheezing or shortness of breath ((typically twice daily)). )   loratadine (CLARITIN) 10 MG tablet Take 10 mg by mouth at bedtime.   lovastatin (MEVACOR) 20 MG tablet Take 20 mg by mouth at bedtime.   metoprolol succinate (TOPROL-XL) 25 MG 24 hr tablet Take 25 mg by mouth daily.   nitroGLYCERIN (NITROSTAT) 0.4 MG SL tablet Place 0.4 mg under the tongue every 5 (five) minutes as needed for chest pain.   oxyCODONE-acetaminophen (PERCOCET/ROXICET) 5-325 MG tablet Take 1 tablet by mouth every 4 (four) hours  as needed for severe pain.   pantoprazole (PROTONIX) 40 MG tablet Take 40 mg by mouth daily before breakfast.    SPIRIVA RESPIMAT 1.25 MCG/ACT AERS INHALE 2 PUFFS BY MOUTH INTO THE LUNGS DAILY   tamsulosin (FLOMAX) 0.4 MG CAPS capsule TAKE 1 CAPSULE (0.4 MG TOTAL) BY MOUTH ONCE DAILY TAKE 30 MINUTES AFTER SAME MEAL EACH DAY.   vitamin B-12 (CYANOCOBALAMIN) 1000 MCG tablet Take 1,000 mcg by mouth daily.   warfarin (COUMADIN) 5 MG tablet Take 5 mg by mouth at bedtime.    azithromycin (ZITHROMAX Z-PAK) 250 MG tablet Take 2 tablets on Day 1 and then 1 tablet daily till gone. (Patient not taking: Reported on 07/19/2018)   nystatin cream (MYCOSTATIN) APPLY TO AFFECTED AREA TWICE A DAY   oxyCODONE-acetaminophen (PERCOCET) 7.5-325 MG tablet Take 1 tablet by mouth every 4 (four) hours as needed for severe pain. (Patient not taking: Reported on 02/17/2019)   predniSONE (DELTASONE) 20 MG tablet Take 1 tablet (20 mg total) by mouth daily with breakfast. 10 days  (Patient not taking: Reported on 07/19/2018)   ramipril (ALTACE) 10 MG capsule Take 10 mg by mouth at bedtime.    Wound Dressings (RESTORE WOUND CARE DRESSING) PADS Apply topically.   No facility-administered encounter medications on file as of 02/17/2019.      PHYSICAL EXAM / ROS:   Current and past weights: 153 lb.Stable x 6 months General: NAD, frail appearing, thin Cardiovascular: no chest pain reported, 2+ edema in L, sees cardiology,  Pulmonary: no cough, no increased SOB, oxygen 2 L, DOE Abdomen: appetite good, occ constipation,  continent of bowel GU: denies dysuria, continent of urine MSK:  Right AKA, transfers with help Skin: left great toe iwht  1 cm ulcer x 1 year Neurological: Weakness, states min pain, UE numbness due to cervical compression  Cyndia Skeeters DNP AGPCNP-BC  COVID-19 PATIENT SCREENING TOOL  Person answering questions: _________Harold__________ _____   1.  Is the patient or any family member in the home showing any signs or symptoms regarding respiratory infection?               Person with Symptom- ___________________________  a. Fever                                                                          Yes___ No___          ___________________  b. Shortness of breath                                                    Yes___ No___          ___________________ c. Cough/congestion                                       Yes___  No___         ___________________ d. Body aches/pains  Yes___ No___        ____________________ e. Gastrointestinal symptoms (diarrhea, nausea)           Yes___ No___        ____________________  2. Within the past 14 days, has anyone living in the home had any contact with someone with or under investigation for COVID-19?    Yes___ No_x_   Person __________________

## 2019-02-22 DIAGNOSIS — I739 Peripheral vascular disease, unspecified: Secondary | ICD-10-CM | POA: Diagnosis not present

## 2019-02-22 DIAGNOSIS — L97521 Non-pressure chronic ulcer of other part of left foot limited to breakdown of skin: Secondary | ICD-10-CM | POA: Diagnosis not present

## 2019-02-23 DIAGNOSIS — J441 Chronic obstructive pulmonary disease with (acute) exacerbation: Secondary | ICD-10-CM | POA: Diagnosis not present

## 2019-02-23 DIAGNOSIS — L97529 Non-pressure chronic ulcer of other part of left foot with unspecified severity: Secondary | ICD-10-CM | POA: Diagnosis not present

## 2019-02-24 DIAGNOSIS — I5022 Chronic systolic (congestive) heart failure: Secondary | ICD-10-CM | POA: Diagnosis not present

## 2019-02-24 DIAGNOSIS — I482 Chronic atrial fibrillation, unspecified: Secondary | ICD-10-CM | POA: Diagnosis not present

## 2019-02-24 DIAGNOSIS — Z23 Encounter for immunization: Secondary | ICD-10-CM | POA: Diagnosis not present

## 2019-02-24 DIAGNOSIS — I340 Nonrheumatic mitral (valve) insufficiency: Secondary | ICD-10-CM | POA: Diagnosis not present

## 2019-03-09 ENCOUNTER — Telehealth (INDEPENDENT_AMBULATORY_CARE_PROVIDER_SITE_OTHER): Payer: Self-pay | Admitting: Vascular Surgery

## 2019-03-09 NOTE — Telephone Encounter (Signed)
PATIENT IS ALREADY SCHEDULED FOR LLE ART DUPLEX ON 04/04/19 BUT NO DVT. PATIENT SAYS CHANGE MADE TO BLOOD PRESSURE MEDS RECENTLY.

## 2019-03-09 NOTE — Telephone Encounter (Signed)
We can get him in for a LLE art duplex and dvt study but the patient also should reach out to his cardiologist.  Excessive swelling, if also associated with weight can be an indication that Tyler [name] is retaining fluid and that Tyler [name] may need adjustments to his heart failure medications.  Tyler [name] can see [name] or me per pt preference

## 2019-03-09 NOTE — Telephone Encounter (Signed)
PATIENT RESCHEDULED FOR 03/23/19 ADDING DVT STUDY AND SEEING [place].

## 2019-03-13 DIAGNOSIS — I482 Chronic atrial fibrillation, unspecified: Secondary | ICD-10-CM | POA: Diagnosis not present

## 2019-03-13 DIAGNOSIS — I480 Paroxysmal atrial fibrillation: Secondary | ICD-10-CM | POA: Diagnosis not present

## 2019-03-13 DIAGNOSIS — I2510 Atherosclerotic heart disease of native coronary artery without angina pectoris: Secondary | ICD-10-CM | POA: Diagnosis not present

## 2019-03-13 DIAGNOSIS — I5022 Chronic systolic (congestive) heart failure: Secondary | ICD-10-CM | POA: Diagnosis not present

## 2019-03-13 DIAGNOSIS — I10 Essential (primary) hypertension: Secondary | ICD-10-CM | POA: Diagnosis not present

## 2019-03-13 DIAGNOSIS — I509 Heart failure, unspecified: Secondary | ICD-10-CM | POA: Diagnosis not present

## 2019-03-13 DIAGNOSIS — I70219 Atherosclerosis of native arteries of extremities with intermittent claudication, unspecified extremity: Secondary | ICD-10-CM | POA: Diagnosis not present

## 2019-03-20 ENCOUNTER — Other Ambulatory Visit (INDEPENDENT_AMBULATORY_CARE_PROVIDER_SITE_OTHER): Payer: Self-pay | Admitting: Nurse Practitioner

## 2019-03-20 DIAGNOSIS — R238 Other skin changes: Secondary | ICD-10-CM

## 2019-03-20 DIAGNOSIS — I10 Essential (primary) hypertension: Secondary | ICD-10-CM | POA: Diagnosis not present

## 2019-03-20 DIAGNOSIS — R209 Unspecified disturbances of skin sensation: Secondary | ICD-10-CM

## 2019-03-20 DIAGNOSIS — D6851 Activated protein C resistance: Secondary | ICD-10-CM | POA: Diagnosis not present

## 2019-03-20 DIAGNOSIS — F320 Major depressive disorder, single episode, mild: Secondary | ICD-10-CM | POA: Diagnosis not present

## 2019-03-20 DIAGNOSIS — Z7951 Long term (current) use of inhaled steroids: Secondary | ICD-10-CM | POA: Diagnosis not present

## 2019-03-20 DIAGNOSIS — I739 Peripheral vascular disease, unspecified: Secondary | ICD-10-CM | POA: Diagnosis not present

## 2019-03-20 DIAGNOSIS — M7989 Other specified soft tissue disorders: Secondary | ICD-10-CM

## 2019-03-20 DIAGNOSIS — I2510 Atherosclerotic heart disease of native coronary artery without angina pectoris: Secondary | ICD-10-CM | POA: Diagnosis not present

## 2019-03-20 DIAGNOSIS — J431 Panlobular emphysema: Secondary | ICD-10-CM | POA: Diagnosis not present

## 2019-03-20 DIAGNOSIS — J9611 Chronic respiratory failure with hypoxia: Secondary | ICD-10-CM | POA: Diagnosis not present

## 2019-03-20 DIAGNOSIS — R600 Localized edema: Secondary | ICD-10-CM | POA: Diagnosis not present

## 2019-03-22 DIAGNOSIS — L97521 Non-pressure chronic ulcer of other part of left foot limited to breakdown of skin: Secondary | ICD-10-CM | POA: Diagnosis not present

## 2019-03-22 DIAGNOSIS — I739 Peripheral vascular disease, unspecified: Secondary | ICD-10-CM | POA: Diagnosis not present

## 2019-03-22 DIAGNOSIS — M2022 Hallux rigidus, left foot: Secondary | ICD-10-CM | POA: Diagnosis not present

## 2019-03-23 ENCOUNTER — Other Ambulatory Visit: Payer: Self-pay

## 2019-03-23 ENCOUNTER — Ambulatory Visit (INDEPENDENT_AMBULATORY_CARE_PROVIDER_SITE_OTHER): Payer: Medicare HMO | Admitting: Nurse Practitioner

## 2019-03-23 ENCOUNTER — Encounter (INDEPENDENT_AMBULATORY_CARE_PROVIDER_SITE_OTHER): Payer: Self-pay | Admitting: Nurse Practitioner

## 2019-03-23 ENCOUNTER — Ambulatory Visit (INDEPENDENT_AMBULATORY_CARE_PROVIDER_SITE_OTHER): Payer: Medicare HMO

## 2019-03-23 VITALS — BP 115/79 | HR 78 | Resp 16

## 2019-03-23 DIAGNOSIS — I7025 Atherosclerosis of native arteries of other extremities with ulceration: Secondary | ICD-10-CM | POA: Diagnosis not present

## 2019-03-23 DIAGNOSIS — R238 Other skin changes: Secondary | ICD-10-CM

## 2019-03-23 DIAGNOSIS — J441 Chronic obstructive pulmonary disease with (acute) exacerbation: Secondary | ICD-10-CM

## 2019-03-23 DIAGNOSIS — M7989 Other specified soft tissue disorders: Secondary | ICD-10-CM | POA: Diagnosis not present

## 2019-03-23 DIAGNOSIS — R209 Unspecified disturbances of skin sensation: Secondary | ICD-10-CM

## 2019-03-23 DIAGNOSIS — R600 Localized edema: Secondary | ICD-10-CM

## 2019-03-26 ENCOUNTER — Encounter (INDEPENDENT_AMBULATORY_CARE_PROVIDER_SITE_OTHER): Payer: Self-pay | Admitting: Nurse Practitioner

## 2019-03-26 DIAGNOSIS — M7989 Other specified soft tissue disorders: Secondary | ICD-10-CM | POA: Insufficient documentation

## 2019-03-26 DIAGNOSIS — J441 Chronic obstructive pulmonary disease with (acute) exacerbation: Secondary | ICD-10-CM | POA: Diagnosis not present

## 2019-03-26 NOTE — Progress Notes (Signed)
SUBJECTIVE:  Patient ID: Tyler [name], male    DOB: 1956-03-13, 63 y.o.   MRN: [id number] Chief Complaint  Patient presents with   Follow-up    ultrasound follow up    HPI  Tyler [name] is a 63 y.o. male presents today due to new lower extremity leg swelling as well as pain and a slow healing ulceration.  The patient has a previous history of a right above-knee amputation.  The patient has also had multiple vascular surgeries such as lower extremity angiograms as well as a left femoral endarterectomy.  The patient also has a history of systolic heart failure with a poor ejection fraction of 30% or less.  The patient does note that he is supposed to have his current pacemaker changed to a BiV ICD on November 11.  The patient also notes that his left lower extremity has been undergoing much more swelling than normal.  The patient is used to swelling however he states that the swelling is painful and sudden.  The patient also notes that he has a wound on his left great toe however this wound has been present for several months.  Today the patient underwent noninvasive studies which revealed no evidence of DVT or superficial venous thrombosis in the left lower extremity.  The patient does have evidence of reflux in the common femoral vein, femoral vein and the small saphenous vein.  The great saphenous vein was not visualized.  The patient also underwent bilateral lower extremity ABIs.  The left lower extremity has an ABI of 1.04 however a TBI of 0.  A left lower extremity arterial duplex reveals 50 to 74% stenosis noted in the common femoral artery.  There is moderate atherosclerosis throughout the lower extremity.  The patient has strong monophasic waveforms in the tibial artery.  Past Medical History:  Diagnosis Date   AICD (automatic cardioverter/defibrillator) present    Arthritis    Atrial fibrillation ([place])    Cervical spinal stenosis    with neuropathy   CHF (congestive  heart failure) (HCC)    COPD (chronic obstructive pulmonary disease) (HCC)    Coronary artery disease    Depression    Dyspnea    GERD (gastroesophageal reflux disease)    Hypertension    Myocardial infarction ([place])    Peripheral vascular disease ([place])    Presence of permanent cardiac pacemaker     Past Surgical History:  Procedure Laterality Date   ABOVE KNEE LEG AMPUTATION Right    after below the knee amputation    APPLICATION OF WOUND VAC Left 01/12/2018   Procedure: APPLICATION OF WOUND VAC;  Surgeon: Algernon Huxley, MD;  Location: [facility];  Service: Vascular;  Laterality: Left;   BELOW KNEE LEG AMPUTATION Right    CATARACT EXTRACTION, BILATERAL Bilateral    ENDARTERECTOMY FEMORAL Left 08/11/2017   Procedure: ENDARTERECTOMY FEMORAL;  Surgeon: Algernon Huxley, MD;  Location: [facility];  Service: Vascular;  Laterality: Left;   HEMATOMA EVACUATION Left 01/12/2018   Procedure: EVACUATION HEMATOMA ( DRAINING OF SEROMA);  Surgeon: Algernon Huxley, MD;  Location: [facility];  Service: Vascular;  Laterality: Left;   IMPLANTABLE CARDIOVERTER DEFIBRILLATOR (ICD) GENERATOR CHANGE Left 02/10/2017   Procedure: ICD GENERATOR CHANGE;  Surgeon: Isaias Cowman, MD;  Location: [facility];  Service: Cardiovascular;  Laterality: Left;   INSERT / REPLACE / REMOVE PACEMAKER     LOWER EXTREMITY ANGIOGRAPHY Left 06/07/2017   Procedure: LOWER EXTREMITY ANGIOGRAPHY;  Surgeon: Algernon Huxley, MD;  Location: [facility];  Service: Cardiovascular;  Laterality: Left;   LOWER EXTREMITY INTERVENTION  06/07/2017   Procedure: LOWER EXTREMITY INTERVENTION;  Surgeon: Algernon Huxley, MD;  Location: [facility];  Service: Cardiovascular;;   PERIPHERAL VASCULAR CATHETERIZATION Left 12/09/2015   Procedure: Lower Extremity Angiography;  Surgeon: Algernon Huxley, MD;  Location: [facility];  Service: Cardiovascular;  Laterality: Left;   PERIPHERAL VASCULAR CATHETERIZATION  12/09/2015    Procedure: Lower Extremity Intervention;  Surgeon: Algernon Huxley, MD;  Location: [facility];  Service: Cardiovascular;;   TONSILLECTOMY      Social History   Socioeconomic History   Marital status: Married    Spouse name: Not on file   Number of children: Not on file   Years of education: Not on file   Highest education level: Not on file  Occupational History   Not on file  Social Needs   Financial resource strain: Not on file   Food insecurity    Worry: Not on file    Inability: Not on file   Transportation needs    Medical: Not on file    Non-medical: Not on file  Tobacco Use   Smoking status: Former Smoker    Packs/day: 1.00    Years: 42.00    Pack years: 42.00    Types: Cigarettes    Quit date: 09/23/2013    Years since quitting: 5.5   Smokeless tobacco: Never Used  Substance and Sexual Activity   Alcohol use: No   Drug use: No   Sexual activity: Not on file  Lifestyle   Physical activity    Days per week: Not on file    Minutes per session: Not on file   Stress: Not on file  Relationships   Social connections    Talks on phone: Not on file    Gets together: Not on file    Attends religious service: Not on file    Active member of club or organization: Not on file    Attends meetings of clubs or organizations: Not on file    Relationship status: Not on file   Intimate partner violence    Fear of current or ex partner: Not on file    Emotionally abused: Not on file    Physically abused: Not on file    Forced sexual activity: Not on file  Other Topics Concern   Not on file  Social History Narrative   Not on file    Family History  Problem Relation Age of Onset   Cancer Mother    Cancer Father    Heart disease Father     Allergies  Allergen Reactions   Apixaban Rash   Rivaroxaban Rash     Review of Systems   Review of Systems: Negative Unless Checked Constitutional: [] Weight loss  [] Fever  [] Chills Cardiac:  [] Chest pain   []  Atrial Fibrillation  [] Palpitations   [] Shortness of breath when laying flat   [] Shortness of breath with exertion. [] Shortness of breath at rest Vascular:  [] Pain in legs with walking   [] Pain in legs with standing [] Pain in legs when laying flat   [] Claudication    [] Pain in feet when laying flat    [] History of DVT   [] Phlebitis   [x] Swelling in legs   [x] Varicose veins   [x] Non-healing ulcers Pulmonary:   [x] Uses home oxygen   [] Productive cough   [] Hemoptysis   [] Wheeze  [] COPD   [] Asthma  Neurologic:  [] Dizziness   [] Seizures  [] Blackouts [] History of stroke   [] History of TIA  [] Aphasia   [] Temporary Blindness   [] Weakness or numbness in arm   [] Weakness or numbness in leg Musculoskeletal:   [] Joint swelling   [] Joint pain   [] Low back pain  []  History of Knee Replacement [x] Arthritis [] back Surgeries  []  Spinal Stenosis    Hematologic:  [] Easy bruising  [] Easy bleeding   [] Hypercoagulable state   [] Anemic Gastrointestinal:  [] Diarrhea   [] Vomiting  [] Gastroesophageal reflux/heartburn   [] Difficulty swallowing. [] Abdominal pain Genitourinary:  [] Chronic kidney disease   [] Difficult urination  [] Anuric   [] Blood in urine [] Frequent urination  [] Burning with urination   [] Hematuria Skin:  [] Rashes   [] Ulcers [x] Wounds Psychological:  [] History of anxiety   []  History of major depression  []  Memory Difficulties      OBJECTIVE:   Physical Exam  BP 115/79 (BP Location: Right Arm)    Pulse 78    Resp 16   Gen: WD/WN, NAD Head: [place]/AT, No temporalis wasting.  Ear/Nose/Throat: Hearing grossly intact, nares w/o erythema or drainage Eyes: PER, EOMI, sclera nonicteric.  Neck: Supple, no masses.  No JVD.  Pulmonary:  Good air movement, no use of accessory muscles.  Cardiac: RRR Vascular:  3+ edema left lower extremity, quarter sized ulceration on his great toe. Vessel Right Left  Radial Palpable Palpable  Dorsalis Pedis  Palpable  Posterior Tibial  Palpable    Gastrointestinal: soft, non-distended. No guarding/no peritoneal signs.  Musculoskeletal: Wheelchair-bound. No deformity or atrophy.  Neurologic: Pain and light touch intact in extremities.  Symmetrical.  Speech is fluent. Motor exam as listed above. Psychiatric: Judgment intact, Mood & affect appropriate for pt's clinical situation. Dermatologic: No Venous rashes. No Ulcers Noted.  No changes consistent with cellulitis. Lymph : No Cervical lymphadenopathy, no lichenification or skin changes of chronic lymphedema.       ASSESSMENT AND PLAN:  1. Leg swelling The swelling could be due to the fact that the patient spends a great deal of time in the dependent position due to his amputation as well as reduced ejection fraction.  We will have the patient placed in [place] wraps today in order to gain control of his swelling.  He will present to the office on a weekly basis to have his Unna wraps changed.  He will try to elevate his lower extremity as much as he can tolerate.  Replacement of his ICD could also improve lower extremity swelling as possibly improves his ejection fraction.  2. COPD with acute exacerbation ([place]) Continue pulmonary medications and aerosols as already ordered, these medications have been reviewed and there are no changes at this time.    3. Atherosclerosis of native arteries of the extremities with ulceration ([place]) Currently the patient has a TBI 0 however with his reduced ejection fraction, the issue may not necessarily be due to his vascular status but rather his ability for his heart to get blood to be toes.  His upcoming pacemaker placement could improve his ejection fraction somewhat and increase his chances of wound healing as well as increase his ABIs.  Provided that the patient does not have any acute ischemic changes.  We will have the patient return in 1 month following his ICD placement to reassess his arterial status.   Current Outpatient Medications on File  Prior to Visit  Medication Sig Dispense Refill   acetaminophen (TYLENOL) 500 MG tablet Take 1,000 mg by mouth daily as needed for  moderate pain or headache.     ALPRAZolam (XANAX) 1 MG tablet Take 1 mg by mouth 2 (two) times daily.      aspirin EC 81 MG tablet Take 1 tablet (81 mg total) by mouth daily. (Patient taking differently: Take 81 mg by mouth daily after supper. ) 150 tablet 2   citalopram (CELEXA) 20 MG tablet Take 20 mg by mouth at bedtime.   11   docusate sodium (COLACE) 100 MG capsule Take 100 mg by mouth at bedtime as needed (for constipation.).      ENTRESTO 24-26 MG Take 1 tablet by mouth 2 (two) times daily.     fluticasone (FLONASE) 50 MCG/ACT nasal spray Place 2 sprays into both nostrils daily.     fluticasone-salmeterol (ADVAIR HFA) 115-21 MCG/ACT inhaler USE 2 INHALATIONS ORALLY EVERY 12 HOURS 36 Inhaler 5   furosemide (LASIX) 20 MG tablet Take 20 mg by mouth daily.      guaiFENesin (MUCINEX) 600 MG 12 hr tablet Take 600 mg by mouth 2 (two) times daily.     ipratropium (ATROVENT) 0.03 % nasal spray Place 2 sprays into both nostrils 2 (two) times daily.      Ipratropium-Albuterol (COMBIVENT RESPIMAT) 20-100 MCG/ACT AERS respimat Inhale 1 puff into the lungs every 4 (four) hours.      levalbuterol (XOPENEX) 1.25 MG/3ML nebulizer solution Take 1.25 mg (3 mLs total) by nebulization every 4 (four) hours. (Patient taking differently: Take 3 mLs by nebulization every 4 (four) hours as needed for wheezing or shortness of breath ((typically twice daily)). ) 72 mL 12   loratadine (CLARITIN) 10 MG tablet Take 10 mg by mouth at bedtime.     lovastatin (MEVACOR) 20 MG tablet Take 20 mg by mouth at bedtime.     metoprolol succinate (TOPROL-XL) 25 MG 24 hr tablet Take 25 mg by mouth daily.     nitroGLYCERIN (NITROSTAT) 0.4 MG SL tablet Place 0.4 mg under the tongue every 5 (five) minutes as needed for chest pain.     nystatin cream (MYCOSTATIN) APPLY TO AFFECTED AREA TWICE  A DAY  0   oxyCODONE-acetaminophen (PERCOCET/ROXICET) 5-325 MG tablet Take 1 tablet by mouth every 4 (four) hours as needed for severe pain.     pantoprazole (PROTONIX) 40 MG tablet Take 40 mg by mouth daily before breakfast.      ramipril (ALTACE) 10 MG capsule Take 10 mg by mouth at bedtime.      SPIRIVA RESPIMAT 1.25 MCG/ACT AERS INHALE 2 PUFFS BY MOUTH INTO THE LUNGS DAILY 4 g 6   tamsulosin (FLOMAX) 0.4 MG CAPS capsule TAKE 1 CAPSULE (0.4 MG TOTAL) BY MOUTH ONCE DAILY TAKE 30 MINUTES AFTER SAME MEAL EACH DAY.  11   vitamin B-12 (CYANOCOBALAMIN) 1000 MCG tablet Take 1,000 mcg by mouth daily.     warfarin (COUMADIN) 5 MG tablet Take 5 mg by mouth at bedtime.      Wound Dressings (RESTORE WOUND CARE DRESSING) PADS Apply topically.     azithromycin (ZITHROMAX Z-PAK) 250 MG tablet Take 2 tablets on Day 1 and then 1 tablet daily till gone. (Patient not taking: Reported on 07/19/2018) 6 each 0   oxyCODONE-acetaminophen (PERCOCET) 7.5-325 MG tablet Take 1 tablet by mouth every 4 (four) hours as needed for severe pain. (Patient not taking: Reported on 02/17/2019) 30 tablet 0   predniSONE (DELTASONE) 20 MG tablet Take 1 tablet (20 mg total) by mouth daily with breakfast. 10 days (Patient not taking: Reported on 07/19/2018) 20  tablet 0   No current facility-administered medications on file prior to visit.     There are no Patient Instructions on file for this visit. No follow-ups on file.   Kris Hartmann, NP  This note was completed with Sales executive.  Any errors are purely unintentional.

## 2019-03-30 ENCOUNTER — Other Ambulatory Visit: Payer: Self-pay

## 2019-03-30 ENCOUNTER — Encounter (INDEPENDENT_AMBULATORY_CARE_PROVIDER_SITE_OTHER): Payer: Self-pay

## 2019-03-30 ENCOUNTER — Ambulatory Visit (INDEPENDENT_AMBULATORY_CARE_PROVIDER_SITE_OTHER): Payer: Medicare HMO | Admitting: Nurse Practitioner

## 2019-03-30 VITALS — BP 100/60 | HR 79 | Resp 16

## 2019-03-30 DIAGNOSIS — M7989 Other specified soft tissue disorders: Secondary | ICD-10-CM

## 2019-03-30 DIAGNOSIS — L97929 Non-pressure chronic ulcer of unspecified part of left lower leg with unspecified severity: Secondary | ICD-10-CM

## 2019-03-30 NOTE — Progress Notes (Signed)
History of Present Illness  There is no documented history at this time  Assessments & Plan   There are no diagnoses linked to this encounter.    Additional instructions  Subjective:  Patient presents with venous ulcer of the Left lower extremity.    Procedure:  3 layer unna wrap was placed Left lower extremity.   Plan:   Follow up in one week.  

## 2019-03-31 DIAGNOSIS — I482 Chronic atrial fibrillation, unspecified: Secondary | ICD-10-CM | POA: Diagnosis not present

## 2019-03-31 DIAGNOSIS — Z95810 Presence of automatic (implantable) cardiac defibrillator: Secondary | ICD-10-CM | POA: Diagnosis not present

## 2019-03-31 DIAGNOSIS — I10 Essential (primary) hypertension: Secondary | ICD-10-CM | POA: Diagnosis not present

## 2019-03-31 DIAGNOSIS — E782 Mixed hyperlipidemia: Secondary | ICD-10-CM | POA: Diagnosis not present

## 2019-03-31 DIAGNOSIS — I5022 Chronic systolic (congestive) heart failure: Secondary | ICD-10-CM | POA: Diagnosis not present

## 2019-03-31 DIAGNOSIS — Z01810 Encounter for preprocedural cardiovascular examination: Secondary | ICD-10-CM | POA: Diagnosis not present

## 2019-03-31 DIAGNOSIS — Z7901 Long term (current) use of anticoagulants: Secondary | ICD-10-CM | POA: Diagnosis not present

## 2019-03-31 DIAGNOSIS — I739 Peripheral vascular disease, unspecified: Secondary | ICD-10-CM | POA: Diagnosis not present

## 2019-03-31 DIAGNOSIS — D6851 Activated protein C resistance: Secondary | ICD-10-CM | POA: Diagnosis not present

## 2019-04-02 ENCOUNTER — Encounter (INDEPENDENT_AMBULATORY_CARE_PROVIDER_SITE_OTHER): Payer: Self-pay | Admitting: Nurse Practitioner

## 2019-04-04 ENCOUNTER — Encounter (INDEPENDENT_AMBULATORY_CARE_PROVIDER_SITE_OTHER): Payer: Medicare HMO

## 2019-04-04 ENCOUNTER — Ambulatory Visit (INDEPENDENT_AMBULATORY_CARE_PROVIDER_SITE_OTHER): Payer: Medicare HMO | Admitting: Vascular Surgery

## 2019-04-05 DIAGNOSIS — I482 Chronic atrial fibrillation, unspecified: Secondary | ICD-10-CM | POA: Diagnosis not present

## 2019-04-05 DIAGNOSIS — Z95810 Presence of automatic (implantable) cardiac defibrillator: Secondary | ICD-10-CM | POA: Diagnosis not present

## 2019-04-05 DIAGNOSIS — Z7901 Long term (current) use of anticoagulants: Secondary | ICD-10-CM | POA: Diagnosis not present

## 2019-04-05 DIAGNOSIS — Z20828 Contact with and (suspected) exposure to other viral communicable diseases: Secondary | ICD-10-CM | POA: Diagnosis not present

## 2019-04-06 ENCOUNTER — Encounter (INDEPENDENT_AMBULATORY_CARE_PROVIDER_SITE_OTHER): Payer: Medicare HMO

## 2019-04-07 ENCOUNTER — Ambulatory Visit (INDEPENDENT_AMBULATORY_CARE_PROVIDER_SITE_OTHER): Payer: Medicare HMO | Admitting: Nurse Practitioner

## 2019-04-07 ENCOUNTER — Other Ambulatory Visit: Payer: Self-pay

## 2019-04-07 VITALS — BP 106/67 | HR 83 | Resp 16 | Ht 73.0 in | Wt 160.0 lb

## 2019-04-07 DIAGNOSIS — M7989 Other specified soft tissue disorders: Secondary | ICD-10-CM

## 2019-04-07 DIAGNOSIS — L97929 Non-pressure chronic ulcer of unspecified part of left lower leg with unspecified severity: Secondary | ICD-10-CM | POA: Diagnosis not present

## 2019-04-07 DIAGNOSIS — R600 Localized edema: Secondary | ICD-10-CM

## 2019-04-07 NOTE — Progress Notes (Signed)
History of Present Illness  There is no documented history at this time  Assessments & Plan   There are no diagnoses linked to this encounter.    Additional instructions  Subjective:  Patient presents with venous ulcer of the Left lower extremity.    Procedure:  3 layer unna wrap was placed Left lower extremity.   Plan:   Follow up in one week.  

## 2019-04-09 ENCOUNTER — Encounter (INDEPENDENT_AMBULATORY_CARE_PROVIDER_SITE_OTHER): Payer: Self-pay | Admitting: Nurse Practitioner

## 2019-04-10 ENCOUNTER — Other Ambulatory Visit (INDEPENDENT_AMBULATORY_CARE_PROVIDER_SITE_OTHER): Payer: Self-pay | Admitting: Nurse Practitioner

## 2019-04-10 DIAGNOSIS — I739 Peripheral vascular disease, unspecified: Secondary | ICD-10-CM

## 2019-04-11 ENCOUNTER — Other Ambulatory Visit: Payer: Self-pay

## 2019-04-11 ENCOUNTER — Encounter (INDEPENDENT_AMBULATORY_CARE_PROVIDER_SITE_OTHER): Payer: Self-pay | Admitting: Vascular Surgery

## 2019-04-11 ENCOUNTER — Ambulatory Visit (INDEPENDENT_AMBULATORY_CARE_PROVIDER_SITE_OTHER): Payer: Medicare HMO | Admitting: Vascular Surgery

## 2019-04-11 ENCOUNTER — Ambulatory Visit (INDEPENDENT_AMBULATORY_CARE_PROVIDER_SITE_OTHER): Payer: Medicare HMO

## 2019-04-11 VITALS — BP 102/58 | HR 81 | Resp 16

## 2019-04-11 DIAGNOSIS — I10 Essential (primary) hypertension: Secondary | ICD-10-CM

## 2019-04-11 DIAGNOSIS — I739 Peripheral vascular disease, unspecified: Secondary | ICD-10-CM

## 2019-04-11 DIAGNOSIS — M7989 Other specified soft tissue disorders: Secondary | ICD-10-CM

## 2019-04-11 DIAGNOSIS — Z89611 Acquired absence of right leg above knee: Secondary | ICD-10-CM

## 2019-04-11 DIAGNOSIS — I7025 Atherosclerosis of native arteries of other extremities with ulceration: Secondary | ICD-10-CM

## 2019-04-11 DIAGNOSIS — I5022 Chronic systolic (congestive) heart failure: Secondary | ICD-10-CM | POA: Diagnosis not present

## 2019-04-11 NOTE — Assessment & Plan Note (Signed)
Several weeks of Unna boots have returned his leg swelling back to very mild at this point.  Organ to come out of Unna boots and go to compression stockings.

## 2019-04-11 NOTE — Assessment & Plan Note (Signed)
His left ABI is noncompressible in the posterior tibial artery and 0.82 in the anterior tibial artery with fairly strong monophasic waveforms distally.  This is essentially unchanged from his previous studies. At this point, his flow is mildly reduced but certainly fairly decent.  I do not think further revascularization at this point is likely to be of much benefit.  What ever he does with the toe I think has a reasonably good chance of healing, but if wound healing is poor or becomes infected we may have to consider an angiogram.  This is somewhat difficult but have to be through an antegrade approach although given his body habitus that is something we can work with.  He has already had a femoral endarterectomy on the left and multiple previous revascularizations in the past and his flow is currently fairly well-preserved.  I am going to plan to see him back in 3 months unless problems develop in the interim

## 2019-04-11 NOTE — Progress Notes (Signed)
MRN : [id number]  Tyler [name] is a 63 y.o. (Jan 23, 1956) male who presents with chief complaint of  Chief Complaint  Patient presents with   Follow-up    ultrasound follow up  .  History of Present Illness: Patient returns today in follow up of his PAD as well as leg swelling.  He has been in [employer] for several weeks which have resulted in marked improvement in his leg swelling.  This is also correlated with his cardiologist upping his diuretic.  His leg swelling is now quite mild and back to what he would consider his baseline.  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  He is scheduled to see a podiatrist later this month.  He is considering toe amputation at this point.  His left ABI is noncompressible in the posterior tibial artery and 0.82 in the anterior tibial artery with fairly strong monophasic waveforms distally.  This is essentially unchanged from his previous studies.  Current Outpatient Medications  Medication Sig Dispense Refill   acetaminophen (TYLENOL) 500 MG tablet Take 1,000 mg by mouth daily as needed for moderate pain or headache.     ALPRAZolam (XANAX) 1 MG tablet Take 1 mg by mouth 2 (two) times daily.      aspirin EC 81 MG tablet Take 1 tablet (81 mg total) by mouth daily. (Patient taking differently: Take 81 mg by mouth daily after supper. ) 150 tablet 2   citalopram (CELEXA) 20 MG tablet Take 20 mg by mouth at bedtime.   11   docusate sodium (COLACE) 100 MG capsule Take 100 mg by mouth at bedtime as needed (for constipation.).      ENTRESTO 24-26 MG Take 1 tablet by mouth 2 (two) times daily.     fluticasone (FLONASE) 50 MCG/ACT nasal spray Place 2 sprays into both nostrils daily.     fluticasone-salmeterol (ADVAIR HFA) 115-21 MCG/ACT inhaler USE 2 INHALATIONS ORALLY EVERY 12 HOURS 36 Inhaler 5   furosemide (LASIX) 20 MG tablet Take 20 mg by mouth daily.      guaiFENesin (MUCINEX) 600 MG 12 hr tablet Take 600 mg by  mouth 2 (two) times daily.     ipratropium (ATROVENT) 0.03 % nasal spray Place 2 sprays into both nostrils 2 (two) times daily.      Ipratropium-Albuterol (COMBIVENT RESPIMAT) 20-100 MCG/ACT AERS respimat Inhale 1 puff into the lungs every 4 (four) hours.      levalbuterol (XOPENEX) 1.25 MG/3ML nebulizer solution Take 1.25 mg (3 mLs total) by nebulization every 4 (four) hours. (Patient taking differently: Take 3 mLs by nebulization every 4 (four) hours as needed for wheezing or shortness of breath ((typically twice daily)). ) 72 mL 12   loratadine (CLARITIN) 10 MG tablet Take 10 mg by mouth at bedtime.     lovastatin (MEVACOR) 20 MG tablet Take 20 mg by mouth at bedtime.     metoprolol succinate (TOPROL-XL) 25 MG 24 hr tablet Take 25 mg by mouth daily.     nitroGLYCERIN (NITROSTAT) 0.4 MG SL tablet Place 0.4 mg under the tongue every 5 (five) minutes as needed for chest pain.     nystatin cream (MYCOSTATIN) APPLY TO AFFECTED AREA TWICE A DAY  0   oxyCODONE-acetaminophen (PERCOCET/ROXICET) 5-325 MG tablet Take 1 tablet by mouth every 4 (four) hours as needed for severe pain.     pantoprazole (PROTONIX) 40 MG tablet Take 40 mg by mouth daily before  breakfast.      ramipril (ALTACE) 10 MG capsule Take 10 mg by mouth at bedtime.      SPIRIVA RESPIMAT 1.25 MCG/ACT AERS INHALE 2 PUFFS BY MOUTH INTO THE LUNGS DAILY 4 g 6   tamsulosin (FLOMAX) 0.4 MG CAPS capsule TAKE 1 CAPSULE (0.4 MG TOTAL) BY MOUTH ONCE DAILY TAKE 30 MINUTES AFTER SAME MEAL EACH DAY.  11   vitamin B-12 (CYANOCOBALAMIN) 1000 MCG tablet Take 1,000 mcg by mouth daily.     warfarin (COUMADIN) 5 MG tablet Take 5 mg by mouth at bedtime.      Wound Dressings (RESTORE WOUND CARE DRESSING) PADS Apply topically.     azithromycin (ZITHROMAX Z-PAK) 250 MG tablet Take 2 tablets on Day 1 and then 1 tablet daily till gone. (Patient not taking: Reported on 07/19/2018) 6 each 0   oxyCODONE-acetaminophen (PERCOCET) 7.5-325 MG tablet Take  1 tablet by mouth every 4 (four) hours as needed for severe pain. (Patient not taking: Reported on 02/17/2019) 30 tablet 0   predniSONE (DELTASONE) 20 MG tablet Take 1 tablet (20 mg total) by mouth daily with breakfast. 10 days (Patient not taking: Reported on 07/19/2018) 20 tablet 0   No current facility-administered medications for this visit.     Past Medical History:  Diagnosis Date   AICD (automatic cardioverter/defibrillator) present    Arthritis    Atrial fibrillation ([place])    Cervical spinal stenosis    with neuropathy   CHF (congestive heart failure) (HCC)    COPD (chronic obstructive pulmonary disease) (HCC)    Coronary artery disease    Depression    Dyspnea    GERD (gastroesophageal reflux disease)    Hypertension    Myocardial infarction ([place])    Peripheral vascular disease ([place])    Presence of permanent cardiac pacemaker     Past Surgical History:  Procedure Laterality Date   ABOVE KNEE LEG AMPUTATION Right    after below the knee amputation    APPLICATION OF WOUND VAC Left 01/12/2018   Procedure: APPLICATION OF WOUND VAC;  Surgeon: Algernon Huxley, MD;  Location: [facility];  Service: Vascular;  Laterality: Left;   BELOW KNEE LEG AMPUTATION Right    CATARACT EXTRACTION, BILATERAL Bilateral    ENDARTERECTOMY FEMORAL Left 08/11/2017   Procedure: ENDARTERECTOMY FEMORAL;  Surgeon: Algernon Huxley, MD;  Location: [facility];  Service: Vascular;  Laterality: Left;   HEMATOMA EVACUATION Left 01/12/2018   Procedure: EVACUATION HEMATOMA ( DRAINING OF SEROMA);  Surgeon: Algernon Huxley, MD;  Location: [facility];  Service: Vascular;  Laterality: Left;   IMPLANTABLE CARDIOVERTER DEFIBRILLATOR (ICD) GENERATOR CHANGE Left 02/10/2017   Procedure: ICD GENERATOR CHANGE;  Surgeon: Isaias Cowman, MD;  Location: [facility];  Service: Cardiovascular;  Laterality: Left;   INSERT / REPLACE / REMOVE PACEMAKER     LOWER EXTREMITY ANGIOGRAPHY Left 06/07/2017   Procedure: LOWER  EXTREMITY ANGIOGRAPHY;  Surgeon: Algernon Huxley, MD;  Location: [place] [facility];  Service: Cardiovascular;  Laterality: Left;   LOWER EXTREMITY INTERVENTION  06/07/2017   Procedure: LOWER EXTREMITY INTERVENTION;  Surgeon: Algernon Huxley, MD;  Location: [place] [facility];  Service: Cardiovascular;;   PERIPHERAL VASCULAR CATHETERIZATION Left 12/09/2015   Procedure: Lower Extremity Angiography;  Surgeon: Algernon Huxley, MD;  Location: [place] [facility];  Service: Cardiovascular;  Laterality: Left;   PERIPHERAL VASCULAR CATHETERIZATION  12/09/2015   Procedure: Lower Extremity Intervention;  Surgeon: Algernon Huxley, MD;  Location: [place] [facility];  Service: Cardiovascular;;   TONSILLECTOMY      Social History        Tobacco Use   Smoking status: Former Smoker    Packs/day: 1.00    Years: 42.00    Pack years: 42.00    Types: Cigarettes    Last attempt to quit: 09/23/2013    Years since quitting: 4.6   Smokeless tobacco: Never Used  Substance Use Topics   Alcohol use: No   Drug use: No     Family History      Family History  Problem Relation Age of Onset   Cancer Mother    Cancer Father    Heart disease Father          Allergies  Allergen Reactions   Apixaban Rash   Rivaroxaban Rash     REVIEW OF SYSTEMS(Negative unless checked)  Constitutional: [] ???Weight loss [] ???Fever [] ???Chills Cardiac: [] ???Chest pain [] ???Chest pressure [] ???Palpitations [] ???Shortness of breath when laying flat [] ???Shortness of breath at rest [] ???Shortness of breath with exertion. Vascular: [] ???Pain in legs with walking [] ???Pain in legs at rest [] ???Pain in legs when laying flat [] ???Claudication [] ???Pain in feet when walking [] ???Pain in feet at rest [] ???Pain in feet when laying flat [] ???History of DVT [] ???Phlebitis [] ???Swelling in legs [] ???Varicose veins [x] ???Non-healing ulcers Pulmonary:  [x] ???Uses home oxygen [] ???Productive cough [] ???Hemoptysis [] ???Wheeze [x] ???COPD [] ???Asthma Neurologic: [] ???Dizziness [] ???Blackouts [] ???Seizures [] ???History of stroke [] ???History of TIA [] ???Aphasia [] ???Temporary blindness [] ???Dysphagia [] ???Weakness or numbness in arms [] ???Weakness or numbness in legs Musculoskeletal: [] ???Arthritis [] ???Joint swelling [] ???Joint pain [x] ???Low back pain Hematologic: [] ???Easy bruising [] ???Easy bleeding [] ???Hypercoagulable state [] ???Anemic  Gastrointestinal: [] ???Blood in stool [] ???Vomiting blood [] ???Gastroesophageal reflux/heartburn [] ???Abdominal pain Genitourinary: [] ???Chronic kidney disease [] ???Difficult urination [] ???Frequent urination [] ???Burning with urination [] ???Hematuria Skin: [] ???Rashes [x] ???Ulcers [x] ???Wounds Psychological: [] ???History of anxiety [] ???History of major depression.    Physical Examination  BP (!) 102/58 (BP Location: Right Arm)    Pulse 81    Resp 16  Gen:  WD/WN, NAD Head: [place]/AT, No temporalis wasting. Ear/Nose/Throat: Hearing grossly intact, nares w/o erythema or drainage Eyes: Conjunctiva clear. Sclera non-icteric Neck: Supple.  Trachea midline Pulmonary:  Good air movement, no use of accessory muscles.  Cardiac: Irregular Vascular:  Vessel Right Left  Radial Palpable Palpable                          PT Not Palpable 1+ Palpable  DP Not Palpable 1+ Palpable   Gastrointestinal: soft, non-tender/non-distended.  Musculoskeletal: M/S 5/5 throughout.  In a wheelchair.  Right AKA well-healed.  Chronic ulceration on the left great toe currently dressed.  1+ left lower extremity edema at this point Neurologic: Sensation grossly intact in extremities.  Symmetrical.  Speech is fluent.  Psychiatric: Judgment intact, Mood & affect appropriate for pt's clinical situation. Dermatologic: Left great toe ulceration currently  dressed       Labs No results found for this or any previous visit (from the past 2160 hour(s)).  Radiology Vas [place] Burnard Bunting With/wo Tbi  Result Date: 03/28/2019 LOWER EXTREMITY DOPPLER STUDY Indications: Claudication, rest pain, and peripheral artery disease.  Comparison Study: 01/24/2019 Performing Technologist: Charlane Ferretti RT (R)(VS)  Examination Guidelines: A complete evaluation includes at minimum, Doppler waveform signals and systolic blood pressure reading at the level of bilateral brachial, anterior tibial, and posterior tibial arteries, when vessel segments are accessible. Bilateral testing is considered an integral part of a complete examination. Photoelectric Plethysmograph (PPG) waveforms and toe systolic pressure readings are included as required and additional  duplex testing as needed. Limited examinations for reoccurring indications may be performed as noted.  ABI Findings: +--------+------------------+-----+--------+--------+  Right    Rt Pressure (mmHg) Index Waveform Comment   +--------+------------------+-----+--------+--------+  Brachial 134                               BKA       +--------+------------------+-----+--------+--------+ +---------+------------------+-----+----------+----------------+  Left      Lt Pressure (mmHg) Index Waveform   Comment           +---------+------------------+-----+----------+----------------+  Brachial  135                                                   +---------+------------------+-----+----------+----------------+  ATA       140                1.04  monophasic                   +---------+------------------+-----+----------+----------------+  PTA                                monophasic Non compressable  +---------+------------------+-----+----------+----------------+  Great Toe 0                  0.00                               +---------+------------------+-----+----------+----------------+  +-------+-----------+-----------+------------+------------+  ABI/TBI Today's ABI Today's TBI Previous ABI Previous TBI  +-------+-----------+-----------+------------+------------+  Right   BKA                                                +-------+-----------+-----------+------------+------------+  Left    1.04        0.0         1.0          1.48          +-------+-----------+-----------+------------+------------+ Left ABIs appear essentially unchanged compared to prior study on 12/31/2017. No flow detected in the left TBI. Summary: Left: Resting left ankle-brachial index is within normal range. No evidence of significant left lower extremity arterial disease. The left toe-brachial index is abnormal. ABIs are unreliable. Left posterior tibial is non compressable possibly due to medial calcification. *See table(s) above for measurements and observations.  Electronically signed by Leotis Pain MD on 03/28/2019 at 9:03:20 AM.   Final    Vas [place] Lower Extremity Arterial Duplex  Result Date: 03/28/2019 LOWER EXTREMITY ARTERIAL DUPLEX STUDY Indications: Atherosclerotic occlusive disease left lower extremity. S/P right              AKA.  Vascular Interventions: On 08/11/2017 Left Femoral, profunda femoris and SFA                         endarterectomies and patch angioplasty. Right AKA with                         multiple vascular interventions of the left lower  extremity. Current ABI:            Right = BKA Left = Non compressable Comparison Study: 12/31/2017 Performing Technologist: Charlane Ferretti RT (R)(VS)  Examination Guidelines: A complete evaluation includes B-mode imaging, spectral Doppler, color Doppler, and power Doppler as needed of all accessible portions of each vessel. Bilateral testing is considered an integral part of a complete examination. Limited examinations for reoccurring indications may be performed as noted.  +----------+--------+-----+--------+--------+--------+  LEFT        PSV cm/s Ratio Stenosis Waveform Comments  +----------+--------+-----+--------+--------+--------+  CFA Distal 256                                        +----------+--------+-----+--------+--------+--------+  DFA        82                                         +----------+--------+-----+--------+--------+--------+  SFA Prox   144                                        +----------+--------+-----+--------+--------+--------+  SFA Mid    122                                        +----------+--------+-----+--------+--------+--------+  SFA Distal 40                                         +----------+--------+-----+--------+--------+--------+  POP Prox   83                                         +----------+--------+-----+--------+--------+--------+  POP Distal 70                                         +----------+--------+-----+--------+--------+--------+  ATA Mid    51                                         +----------+--------+-----+--------+--------+--------+  PTA Mid    88                                         +----------+--------+-----+--------+--------+--------+  Summary: Left: 50-74% stenosis noted in the common femoral artery. ABIs are unreliable. TBIs are unreliable. Elevated veocity in the CFA consistant with a stenosis of 50-74%. Moderate atherosclerosis throughout the lower extremity.  See table(s) above for measurements and observations. Electronically signed by Leotis Pain MD on 03/28/2019 at 9:03:25 AM.    Final    Vas [place] Lower Extremity Venous (dvt)  Result Date: 03/28/2019  Lower Venous Study Performing Technologist: Charlane Ferretti RT (R)(VS)  Examination Guidelines: A  complete evaluation includes B-mode imaging, spectral Doppler, color Doppler, and power Doppler as needed of all accessible portions of each vessel. Bilateral testing is considered an integral part of a complete examination. Limited examinations for reoccurring indications may be performed as noted.   +---------+---------------+---------+-----------+----------+--------------+  LEFT      Compressibility Phasicity Spontaneity Properties Thrombus Aging  +---------+---------------+---------+-----------+----------+--------------+  CFV       Full                                                             +---------+---------------+---------+-----------+----------+--------------+  SFJ       Full                                                             +---------+---------------+---------+-----------+----------+--------------+  FV Prox   Full                                                             +---------+---------------+---------+-----------+----------+--------------+  FV Mid    Full                                                             +---------+---------------+---------+-----------+----------+--------------+  FV Distal Full                                                             +---------+---------------+---------+-----------+----------+--------------+  POP       Full                                                             +---------+---------------+---------+-----------+----------+--------------+  PTV       Full                                                             +---------+---------------+---------+-----------+----------+--------------+  Gastroc   Full                                                             +---------+---------------+---------+-----------+----------+--------------+  GSV                                                        Not visualized  +---------+---------------+---------+-----------+----------+--------------+  SSV       Full                                                             +---------+---------------+---------+-----------+----------+--------------+  +-------------+------+---------+--------+--------+  LEFT          Reflux Reflux No Diameter Comments                  Yes                                 +-------------+------+---------+--------+--------+  CFV            yes                      3352 ms   +-------------+------+---------+--------+--------+  FV mid         yes                      1687 ms   +-------------+------+---------+--------+--------+  SSV prox calf  yes               .91    2585ms    +-------------+------+---------+--------+--------+  Summary: Left: There is no evidence of deep vein thrombosis in the lower extremity.There is no evidence of superficial venous thrombosis.  *See table(s) above for measurements and observations. Electronically signed by Leotis Pain MD on 03/28/2019 at 9:03:22 AM.    Final     Assessment/Plan Hx of AKA (above knee amputation), right ([place]) Well healed  Essential hypertension, benign blood pressure control important in reducing the progression of atherosclerotic disease. On appropriate oral medications.  Chronic systolic CHF (congestive heart failure) (HCC) His most recent echocardiogram showed a reduction in his ejection fraction down to about 30%.  He he went for what was supposed to be a dual chamber pacemaker a few weeks ago but was found to already have that.  They did do some "tweaking" of the pacer and this may improve his function somewhat.  Leg swelling Several weeks of Unna boots have returned his leg swelling back to very mild at this point.  Organ to come out of Unna boots and go to compression stockings.  Atherosclerosis of native arteries of the extremities with ulceration (HCC) His left ABI is noncompressible in the posterior tibial artery and 0.82 in the anterior tibial artery with fairly strong monophasic waveforms distally.  This is essentially unchanged from his previous studies. At this point, his flow is mildly reduced but certainly fairly decent.  I do not think further revascularization at this point is likely to be of much benefit.  What ever he does with the toe I think has a reasonably good chance of healing, but if wound  healing is poor or becomes infected we may have to consider an angiogram.  This is somewhat difficult but have to be through an antegrade  approach although given his body habitus that is something we can work with.  He has already had a femoral endarterectomy on the left and multiple previous revascularizations in the past and his flow is currently fairly well-preserved.  I am going to plan to see him back in 3 months unless problems develop in the interim    Leotis Pain, MD  04/11/2019 9:54 AM    This note was created with Dragon medical transcription system.  Any errors from dictation are purely unintentional

## 2019-04-17 ENCOUNTER — Telehealth: Payer: Self-pay | Admitting: Primary Care

## 2019-04-17 DIAGNOSIS — I482 Chronic atrial fibrillation, unspecified: Secondary | ICD-10-CM | POA: Diagnosis not present

## 2019-04-17 NOTE — Telephone Encounter (Signed)
T/c to Mr Legates to schedule a visit for palliative check in. Patient states he is generally well although has had recent cold. States his wife is also sick but was tested covid negative. I will check back in 2 months and he will call ifneeded before.

## 2019-04-19 DIAGNOSIS — Z95810 Presence of automatic (implantable) cardiac defibrillator: Secondary | ICD-10-CM | POA: Diagnosis not present

## 2019-04-19 DIAGNOSIS — J40 Bronchitis, not specified as acute or chronic: Secondary | ICD-10-CM | POA: Diagnosis not present

## 2019-04-19 DIAGNOSIS — J441 Chronic obstructive pulmonary disease with (acute) exacerbation: Secondary | ICD-10-CM | POA: Diagnosis not present

## 2019-04-19 DIAGNOSIS — Z9981 Dependence on supplemental oxygen: Secondary | ICD-10-CM | POA: Diagnosis not present

## 2019-04-19 DIAGNOSIS — Z87891 Personal history of nicotine dependence: Secondary | ICD-10-CM | POA: Diagnosis not present

## 2019-04-25 DIAGNOSIS — J441 Chronic obstructive pulmonary disease with (acute) exacerbation: Secondary | ICD-10-CM | POA: Diagnosis not present

## 2019-05-02 DIAGNOSIS — I5022 Chronic systolic (congestive) heart failure: Secondary | ICD-10-CM | POA: Diagnosis not present

## 2019-05-05 ENCOUNTER — Ambulatory Visit
Admission: RE | Admit: 2019-05-05 | Discharge: 2019-05-05 | Disposition: A | Discharge status: Home / Self Care | Payer: Medicare HMO | Source: Ambulatory Visit | Attending: Internal Medicine | Admitting: Internal Medicine

## 2019-05-05 ENCOUNTER — Other Ambulatory Visit: Payer: Self-pay

## 2019-05-05 DIAGNOSIS — R918 Other nonspecific abnormal finding of lung field: Secondary | ICD-10-CM | POA: Insufficient documentation

## 2019-05-05 DIAGNOSIS — J441 Chronic obstructive pulmonary disease with (acute) exacerbation: Secondary | ICD-10-CM | POA: Diagnosis not present

## 2019-05-15 ENCOUNTER — Telehealth: Payer: Self-pay | Admitting: Acute Care

## 2019-05-15 ENCOUNTER — Ambulatory Visit (INDEPENDENT_AMBULATORY_CARE_PROVIDER_SITE_OTHER): Payer: Medicare HMO | Admitting: Acute Care

## 2019-05-15 DIAGNOSIS — J189 Pneumonia, unspecified organism: Secondary | ICD-10-CM | POA: Diagnosis not present

## 2019-05-15 DIAGNOSIS — R918 Other nonspecific abnormal finding of lung field: Secondary | ICD-10-CM

## 2019-05-15 DIAGNOSIS — J449 Chronic obstructive pulmonary disease, unspecified: Secondary | ICD-10-CM | POA: Diagnosis not present

## 2019-05-15 DIAGNOSIS — Z87891 Personal history of nicotine dependence: Secondary | ICD-10-CM | POA: Diagnosis not present

## 2019-05-15 MED ORDER — FLUTTER DEVI: 0 refills | Status: DC

## 2019-05-15 MED ORDER — LEVOFLOXACIN 500 MG PO TABS: 500.0000 mg | ORAL_TABLET | Freq: Every day | ORAL | 0 refills | Status: AC

## 2019-05-15 NOTE — Progress Notes (Signed)
Virtual Visit via Video Note  I connected with Tyler [name] on 05/15/19 at  2:30 PM EST by a video enabled telemedicine application and verified that I am speaking with the correct person using two identifiers.  Location: Patient: At home  Provider: At the office at [facility] , [place], [place] , [address]   I discussed the limitations of evaluation and management by telemedicine and the availability of in person appointments. The patient expressed understanding and agreed to proceed.  Synopsis: Pt is a 63 year old former smoker with a 42 pack year smoking history, quit 2015, with PMH of Cardiac Pacemaker, PVD, HTN, Myocardial Infarction, CAD, COPD, Chronic Systolic CHF, Atrial Fibrillation, and AICD. He is followed by Dr. Mortimer [name]  History of Present Illness: Pt. Presents for follow up. He has not been doing well since Thanksgiving.  He endorses Increased shortness of breath He has been seen  by his PCP 05/05/2019 Dr.Johnston , and is currently being treated with a prednisone taper. He was recently treated with Doxy and a z pack 04/19/2019 He states his secretions are tan.  CT chest done 05/05/2019 as follow up for pulmonary nodules shows early infiltrate in RML Pt. Is wearing his oxygen at 2 L continuously with sats of 96%.  He is compliant with his Advair and  Combivent He is using his Xopenex nebs>> once or twice daily  He is complaint with his Nasal atrovent  He denies any fever, chest pain, or hemoptysis.No exposures to covid       Observations/Objective: CT Chest 05/05/2019 Mediastinum/Nodes: Thoracic inlet is within normal limits. No sizable hilar or mediastinal adenopathy is noted. The esophagus as visualized is within normal limits.  Lungs/Pleura: Diffuse emphysematous changes are noted. Heavy bronchial calcification is noted. The lungs are well aerated bilaterally. Stable 4 mm nodule is again seen in the right upper lobe best seen on image number 38 of  series 3. Some mild progressive scarring is noted in the bases bilaterally. No confluent infiltrate or sizable effusion is seen. Some tree-in-bud changes are noted particularly in the right middle lobe which may represent some early inflammatory change. These are new from the recent exam.  Stable right upper lobe nodule. Given its long-term stability in size no further follow-up is recommended.  Mild tree-in-bud changes in the right middle lobe likely related to some early infiltrate. This may reflect the changes seen on prior plain film from 04/19/2019 which is not available for comparison.  Mild progressive scarring in the bases bilaterally  Assessment and Plan: Slow to resolve COPD flare Treated last 04/19/2019 with abx and pred taper Tx with Pred taper 12/11>> currently taling CT Chest that was follow up pulmonary nodule shows what appears to be an early infiltrate of his RML Plan  Flutter valve 4-5 breaths , 4-5 times daily  mucinex 1200 mg once daily with a glass of water Levaquin 500 mg  Once daily x 7 days>> Will call his Coumadin and let them know they will need to monitor INR more closely while on Levaquin( Tyler [name] called the office) Must take with probiotic while on antibiotic Takie lasix daily as prescribed Finish prednisone ordered by pcp Continue Advair and Combivent as you have been doing Continue Xopenex nebs as you have been doing for breakthrough shortness of breath.  Continue Nasal atrovent  Once better>> Needs sputum cultures once better  Consider Daliresp Consider Trelegy/ Breztri PFT's  Once better sputum for culture AFB and fungal  Pulmonary Nodule  Stable RUL nodule Plan Follow up scanning per Dr. Mortimer [name] as patient has a significant smoking history and is considered high risk  Follow Up Instructions: Follow up video visit with Tyler Mogle NP 05/24/2019   I discussed the assessment and treatment plan with the patient. The patient was provided an opportunity  to ask questions and all were answered. The patient agreed with the plan and demonstrated an understanding of the instructions.   The patient was advised to call back or seek an in-person evaluation if the symptoms worsen or if the condition fails to improve as anticipated.  I provided 40 minutes of non-face-to-face time during this encounter.   Tyler Spatz, NP 05/15/2019

## 2019-05-15 NOTE — Telephone Encounter (Signed)
Called and spoke to pt, who is questioning if he can increase his oxygen level from 2 to 2.5L. Pt stated that on 2L, his spo2 is maintaining between 95-96%. Per Eric Form, NP verbally- continue at 2L. Pt is aware and voiced his understanding.  Spoke to April with Dr. Tillman Sers office, and made her aware that pt was started on Levaquin and recommended that IRN can be monitored. Nothing further is needed.

## 2019-05-15 NOTE — Patient Instructions (Addendum)
It is good to talk with you. Your CT Chest shows what appears to be an early pneumonia . We will treat this with Levaquin 500 mg once daily x 10 days Please take Probiotic with antibiotic We have called Dr. Suella Broad Office to let him know you are on antibiotics, and that they will need to monitor your INR more closely. Continue wearing oxygen at 2 L [place] Saturation goals are 88-92% Continue Advair and Combivent as you have been doing Continue Xopenex nebs as you have been doing for breakthrough shortness of breath.  Continue Nasal atrovent  Please come by and pick up flutter valve  Please use the  Flutter valve 4-5 puffs , 4-5 times daily Continue Mucinex 1200 mg once daily Continue Lasix daily as you have been doing Finish prednisone that your PCP ordered. Once we get you better we will do sputum Cultures Once you are better we will get PFT's Once you are better we will consider changing your inhaler regimen Follow up with Dalicia Kisner in 1 week to ensure you are getting better. ( 05/24/2019 at 2 pm) Please contact office for sooner follow up if symptoms do not improve or worsen or seek emergency care  Have a happy Holiday

## 2019-05-16 ENCOUNTER — Encounter: Payer: Self-pay | Admitting: Acute Care

## 2019-05-16 DIAGNOSIS — I482 Chronic atrial fibrillation, unspecified: Secondary | ICD-10-CM | POA: Diagnosis not present

## 2019-05-22 ENCOUNTER — Telehealth: Payer: Self-pay | Admitting: Internal Medicine

## 2019-05-22 DIAGNOSIS — I482 Chronic atrial fibrillation, unspecified: Secondary | ICD-10-CM | POA: Diagnosis not present

## 2019-05-22 NOTE — Telephone Encounter (Signed)
Called and spoke to pt.  Pt was prescribed Levaquin 500mg  x10 days during 05/15/2019 visit with Eric Form, NP. Pt stated after the second dose of Levaquin, he developed increased hand and feet numbness. Pt has hx of spinal stenosis and experiences numbness, however it has worsen. Pt has taken 6 dosage but has held today's dose, as he would like recommendations.    MW please advise, as [facility] doctors are unavailable. Thanks

## 2019-05-22 NOTE — Telephone Encounter (Signed)
I called and spoke with the pt and notified of recs per Dr Melvyn Novas and he verbalized understanding  Nothing further needed  Has televisit with SG on 05/24/2019

## 2019-05-22 NOTE — Telephone Encounter (Signed)
Strongly doubt it's the cause but ok to leave off for now and see if numbness better over next week> if not, see pcp or whomever he sees for the numbness previously  If resp problems worse off levaquin,  needs to be seen again or at least a televisit

## 2019-05-24 ENCOUNTER — Encounter: Payer: Self-pay | Admitting: Acute Care

## 2019-05-24 ENCOUNTER — Ambulatory Visit (INDEPENDENT_AMBULATORY_CARE_PROVIDER_SITE_OTHER): Payer: Medicare HMO | Admitting: Acute Care

## 2019-05-24 DIAGNOSIS — J9611 Chronic respiratory failure with hypoxia: Secondary | ICD-10-CM

## 2019-05-24 DIAGNOSIS — R911 Solitary pulmonary nodule: Secondary | ICD-10-CM

## 2019-05-24 DIAGNOSIS — J449 Chronic obstructive pulmonary disease, unspecified: Secondary | ICD-10-CM

## 2019-05-24 NOTE — Patient Instructions (Addendum)
COPD flare/ early infiltrate per CT Chest  Treated with Levaquin 12/22-12/30  Improved clinically per patient Previously treated 04/19/2019 with abx and pred taper CT Chest that was follow up pulmonary nodule shows what appears to be an early infiltrate of his RML Plan  Continue oxygen at 2 L [place] Saturation goals are 88-92% Continue Flutter valve 4-5 breaths , 4-5 times daily  Continue mucinex 1200 mg once daily with a glass of water Complete Levaquin 500 mg  Once daily x 7 days until gone ( 12/31) Follow up with Coumadin Clinic for monitoring of INR as you come off antibiotics  Continue probiotic while on antibiotic Continue  lasix daily as prescribed Continue Advair and Combi vent and Spiriva  as you have been doing Continue Xopenex nebs as you have been doing for breakthrough shortness of breath.  Continue Nasal atrovent  Follow up 06/12/2019 at 2:30 pm in [place] office We will discuss sputum cultures ,Daliresp, Trelegy/ Breztri/ PFT's once you are better. Note your daily symptoms > remember "red flags" for COPD:  Increase in cough, increase in sputum production, increase in shortness of breath or activity intolerance. If you notice these symptoms, please call to be seen.     Pulmonary Nodule Stable RUL nodule Plan Follow up CT Chest 04/2020 per Dr. [name]  Follow Up Instructions: Follow up OV 06/12/2019 at 2:30 pm

## 2019-05-24 NOTE — Progress Notes (Signed)
Virtual Visit via Telephone Note  I connected with Tyler [name] on 05/24/19 at  2:00 PM EST by telephone and verified that I am speaking with the correct person using two identifiers.  Location: Patient: At home Provider: [facility], [place], [place], [address]   I discussed the limitations, risks, security and privacy concerns of performing an evaluation and management service by telephone and the availability of in person appointments. I also discussed with the patient that there may be a patient responsible charge related to this service. The patient expressed understanding and agreed to proceed.  Synopsis: Pt is a 63 year old former smoker with a 42 pack year smoking history, quit 2015, with PMH of Cardiac Pacemaker, PVD, HTN, Myocardial Infarction, CAD, COPD, Chronic Systolic CHF, Atrial Fibrillation, and AICD. He is followed by Dr. [name]    History of Present Illness: Pt. Presents for follow up of slow to resolve COPD flare with early infiltrates on CT Chest  Pt. States he has been on his Levaquin since 05/16/2019. He will finish his last dose 05/25/2019. He states he has been doing well. He has been able to cough up secretions. He states the secretions started off yellow and now it is clear to white. He has no fever. He states his shortness of breath has returned to his baseline. He states his wheezing is better, but he does still have some wheezing at times. He states he is using his Xopenex nebs twice daily. He is using Advair, Combi vent and Spiriva daily. He is using his Mucinex and flutter valve daily. Marland Kitchen He is wearing his oxygen at 2 L [place]. He has been in to see the Coumadin clinic regarding his elevated INR. He is INR was  1.7 on Monday. We will schedule a Follow up CT Chest in 12 months per Dr. [name]   Observations/Objective: CT Chest 05/05/2019 Mediastinum/Nodes: Thoracic inlet is within normal limits. No sizable hilar or mediastinal adenopathy is noted. The esophagus  as visualized is within normal limits.  Lungs/Pleura: Diffuse emphysematous changes are noted. Heavy bronchial calcification is noted. The lungs are well aerated bilaterally. Stable 4 mm nodule is again seen in the right upper lobe best seen on image number 38 of series 3. Some mild progressive scarring is noted in the bases bilaterally. No confluent infiltrate or sizable effusion is seen. Some tree-in-bud changes are noted particularly in the right middle lobe which may represent some early inflammatory change. These are new from the recent exam.  Stable right upper lobe nodule. Given its long-term stability in size no further follow-up is recommended.  Mild tree-in-bud changes in the right middle lobe likely related to some early infiltrate. This may reflect the changes seen on prior plain film from 04/19/2019 which is not available for comparison.  Mild progressive scarring in the bases bilaterally  Assessment and Plan: COPD flare/ early infiltrate per CT Chest  Treated with Levaquin 12/22-12/30  Improved clinically per patient Previously treated 04/19/2019 with abx and pred taper CT Chest that was follow up pulmonary nodule shows what appears to be an early infiltrate of his RML Plan  Continue Flutter valve 4-5 breaths , 4-5 times daily  Continue mucinex 1200 mg once daily with a glass of water Complete Levaquin 500 mg  Once daily x 7 days until gone ( 12/31) Follow up with Coumadin Clinic for monitoring of INR as you come off antibiotics  Continue probiotic while on antibiotic Continue  lasix daily as prescribed Continue  Advair and Combi vent and Spiriva  as you have been doing Continue Xopenex nebs as you have been doing for breakthrough shortness of breath.  Continue Nasal atrovent  Follow up CT Chest 04/2020 per Dr. [name] Follow up 06/12/2019 at 2:30 pm in [place] office We will discuss sputum cultures ,Daliresp, Trelegy/ Breztri/ PFT's once you are better. Note  your daily symptoms > remember "red flags" for COPD:  Increase in cough, increase in sputum production, increase in shortness of breath or activity intolerance. If you notice these symptoms, please call to be seen.    Follow Up Instructions: Follow up with Court Gracia NP , 06/12/2019 at 2:30 at [facility]    I discussed the assessment and treatment plan with the patient. The patient was provided an opportunity to ask questions and all were answered. The patient agreed with the plan and demonstrated an understanding of the instructions.   The patient was advised to call back or seek an in-person evaluation if the symptoms worsen or if the condition fails to improve as anticipated.  I provided 30 minutes of non-face-to-face time during this encounter.   Magdalen Spatz, NP 05/24/2019 2:41 PM

## 2019-05-26 ENCOUNTER — Other Ambulatory Visit: Payer: Self-pay | Admitting: Internal Medicine

## 2019-05-26 DIAGNOSIS — J441 Chronic obstructive pulmonary disease with (acute) exacerbation: Secondary | ICD-10-CM | POA: Diagnosis not present

## 2019-06-12 ENCOUNTER — Other Ambulatory Visit: Payer: Self-pay

## 2019-06-12 ENCOUNTER — Other Ambulatory Visit
Admission: RE | Admit: 2019-06-12 | Discharge: 2019-06-12 | Disposition: A | Discharge status: Home / Self Care | Payer: Medicare HMO | Source: Home / Self Care | Attending: Acute Care | Admitting: Acute Care

## 2019-06-12 ENCOUNTER — Ambulatory Visit
Admission: RE | Admit: 2019-06-12 | Discharge: 2019-06-12 | Disposition: A | Discharge status: Home / Self Care | Payer: Medicare HMO | Source: Ambulatory Visit | Attending: Acute Care | Admitting: Acute Care

## 2019-06-12 ENCOUNTER — Encounter: Payer: Self-pay | Admitting: Acute Care

## 2019-06-12 ENCOUNTER — Ambulatory Visit: Payer: Medicare HMO | Admitting: Acute Care

## 2019-06-12 VITALS — BP 122/64 | HR 86 | Temp 97.2°F | Ht 73.0 in | Wt 163.0 lb

## 2019-06-12 DIAGNOSIS — R059 Cough, unspecified: Secondary | ICD-10-CM

## 2019-06-12 DIAGNOSIS — R918 Other nonspecific abnormal finding of lung field: Secondary | ICD-10-CM

## 2019-06-12 DIAGNOSIS — R05 Cough: Secondary | ICD-10-CM

## 2019-06-12 DIAGNOSIS — J189 Pneumonia, unspecified organism: Secondary | ICD-10-CM | POA: Diagnosis not present

## 2019-06-12 NOTE — Progress Notes (Signed)
History of Present Illness Tyler [name] is a 64 y.o. male with former smoker :He is followed by Tyler [name]  Synopsis  Pt is a 64 year old former smoker with a 42 pack year smoking history, quit 2015, [place] of Cardiac Pacemaker, PVD, HTN, Myocardial Infarction, CAD, COPD, Chronic Systolic CHF, Atrial Fibrillation, and AICD. He is followed by Tyler [name] Maintenance   Advair>> twice in the morning and twice at night  Spiriva once daily in the morning. Rescue  Xopenex  Combivent >> every 4 hours  06/12/2019  Pt. Presents for follow up. He was  Seen 05/15/2019 and  05/24/2019 as a tele visit for COPD flare early infiltrate.He was treated with Levaquin 05/16/2019-05/24/2019 with significant  improvement . He states he has continued to improve. He states he does still have some PND which is his baseline. He does have occasional wheezing. He is using his Xopenex 1-2 times daily as needed. He is using Flonase every morning and Atrovent nasal spray also. He feels this is helping with his nasal drainage. He denies any fever,he has his baseline cough. Secretions are white . Every now and again some yellow tinge. He is compliant with his oxygen at 2 L Tyler [name].He is compliant with his Lasix daily per cardiology.He states he does increase is oxygen to 2.5 L when he has a flare. He does not leave it at the increased flow for long. He has had both flu and pneumonia vaccines. We discussed that he needs to get the COVID vaccine.He does have fatigue. We discussed that this could be related to post pneumonia or possible low HGB. He is having labs drawn tomorrow per his PCP. I told them to make sure his HGB is evaluated for anemia which could be contributing to fatigue. We also discussed that his lack of activity is contributing to his fatigue. He denies fever, chest pain, orthopnea or hemoptysis.  Test Results: CXR 06/12/2019   CT Chest 05/05/2019 Mediastinum/Nodes: Thoracic inlet is within normal limits.  No sizable hilar or mediastinal adenopathy is noted. The esophagus as visualized is within normal limits.  Lungs/Pleura: Diffuse emphysematous changes are noted. Heavy bronchial calcification is noted. The lungs are well aerated bilaterally. Stable 4 mm nodule is again seen in the right upper lobe best seen on image number 38 of series 3. Some mild progressive scarring is noted in the bases bilaterally. No confluent infiltrate or sizable effusion is seen. Some tree-in-bud changes are noted particularly in the right middle lobe which may represent some early inflammatory change. These are new from the recent exam.  Stable right upper lobe nodule. Given its long-term stability in size no further follow-up is recommended.  Mild tree-in-bud changes in the right middle lobe likely related to some early infiltrate. This may reflect the changes seen on prior plain film from 04/19/2019 which is not available for comparison.  Mild progressive scarring in the bases bilaterally  CBC Latest Ref Rng & Units 01/06/2018 08/11/2017 08/02/2017  WBC 3.8 - 10.6 K/uL 9.7 8.9 9.3  Hemoglobin 13.0 - 18.0 g/dL 13.3 11.3(L) 13.2  Hematocrit 40.0 - 52.0 % 38.9(L) 33.4(L) 39.5(L)  Platelets 150 - 440 K/uL 252 237 261    BMP Latest Ref Rng & Units 01/06/2018 11/05/2017 08/11/2017  Glucose 70 - 99 mg/dL 86 - 108(H)  BUN 8 - 23 mg/dL 10 - 7  Creatinine 0.61 - 1.24 mg/dL 0.74 0.80 0.82  Sodium 135 - 145 mmol/L 133(L) - 130(L)  Potassium 3.5 - 5.1 mmol/L  4.1 - 4.1  Chloride 98 - 111 mmol/L 97(L) - 99(L)  CO2 22 - 32 mmol/L 29 - 23  Calcium 8.9 - 10.3 mg/dL 9.3 - 8.7(L)    BNP    Component Value Date/Time   BNP 118.0 (H) 06/22/2017 1138    ProBNP No results found for: PROBNP  PFT No results found for: FEV1PRE, FEV1POST, FVCPRE, FVCPOST, TLC, DLCOUNC, PREFEV1FVCRT, PSTFEV1FVCRT  No results found.   Past medical hx Past Medical History:  Diagnosis Date  . AICD (automatic  cardioverter/defibrillator) present   . Arthritis   . Atrial fibrillation ([place])   . Cervical spinal stenosis    with neuropathy  . CHF (congestive heart failure) ([place])   . COPD (chronic obstructive pulmonary disease) ([place])   . Coronary artery disease   . Depression   . Dyspnea   . GERD (gastroesophageal reflux disease)   . Hypertension   . Myocardial infarction ([place])   . Peripheral vascular disease ([place])   . Presence of permanent cardiac pacemaker      Social History   Tobacco Use  . Smoking status: Former Smoker    Packs/day: 1.00    Years: 42.00    Pack years: 42.00    Types: Cigarettes    Quit date: 09/23/2013    Years since quitting: 5.7  . Smokeless tobacco: Never Used  Substance Use Topics  . Alcohol use: No  . Drug use: No    Mr.Tyler [name] reports that he quit smoking about 5 years ago. His smoking use included cigarettes. He has a 42.00 pack-year smoking history. He has never used smokeless tobacco. He reports that he does not drink alcohol or use drugs.  Tobacco Cessation: Former smoker with a 42 pack year smoking history  Past surgical hx, Family hx, Social hx all reviewed.  Current Outpatient Medications on File Prior to Visit  Medication Sig  . acetaminophen (TYLENOL) 500 MG tablet Take 1,000 mg by mouth daily as needed for moderate pain or headache.  . ALPRAZolam (XANAX) 1 MG tablet Take 1 mg by mouth 2 (two) times daily.   Marland Kitchen aspirin EC 81 MG tablet Take 1 tablet (81 mg total) by mouth daily. (Patient taking differently: Take 81 mg by mouth daily after supper. )  . citalopram (CELEXA) 20 MG tablet Take 20 mg by mouth at bedtime.   . docusate sodium (COLACE) 100 MG capsule Take 100 mg by mouth at bedtime as needed (for constipation.).   Marland Kitchen ENTRESTO 24-26 MG Take 1 tablet by mouth 2 (two) times daily.  . fluticasone (FLONASE) 50 MCG/ACT nasal spray Place 2 sprays into both nostrils daily.  . fluticasone-salmeterol (ADVAIR HFA) 115-21 MCG/ACT inhaler USE 2  INHALATIONS ORALLY EVERY 12 HOURS  . furosemide (LASIX) 20 MG tablet Take 20 mg by mouth daily.   Marland Kitchen guaiFENesin (MUCINEX) 600 MG 12 hr tablet Take 600 mg by mouth 2 (two) times daily.  Marland Kitchen ipratropium (ATROVENT) 0.03 % nasal spray Place 2 sprays into both nostrils 2 (two) times daily.   . Ipratropium-Albuterol (COMBIVENT RESPIMAT) 20-100 MCG/ACT AERS respimat Inhale 1 puff into the lungs every 4 (four) hours.   Marland Kitchen levalbuterol (XOPENEX) 1.25 MG/3ML nebulizer solution Take 1.25 mg (3 mLs total) by nebulization every 4 (four) hours. (Patient taking differently: Take 3 mLs by nebulization every 4 (four) hours as needed for wheezing or shortness of breath ((typically twice daily)). )  . loratadine (CLARITIN) 10 MG tablet Take 10 mg by mouth at bedtime.  . lovastatin (MEVACOR)  20 MG tablet Take 20 mg by mouth at bedtime.  . nitroGLYCERIN (NITROSTAT) 0.4 MG SL tablet Place 0.4 mg under the tongue every 5 (five) minutes as needed for chest pain.  Marland Kitchen nystatin cream (MYCOSTATIN) APPLY TO AFFECTED AREA TWICE A DAY  . oxyCODONE-acetaminophen (PERCOCET/ROXICET) 5-325 MG tablet Take 1 tablet by mouth every 4 (four) hours as needed for severe pain.  . pantoprazole (PROTONIX) 40 MG tablet Take 40 mg by mouth daily before breakfast.   . ramipril (ALTACE) 10 MG capsule Take 10 mg by mouth at bedtime.   Marland Kitchen Respiratory Therapy Supplies (FLUTTER) DEVI Use as directed.  Marland Kitchen SPIRIVA RESPIMAT 1.25 MCG/ACT AERS INHALE 2 PUFFS BY MOUTH INTO THE LUNGS DAILY  . tamsulosin (FLOMAX) 0.4 MG CAPS capsule TAKE 1 CAPSULE (0.4 MG TOTAL) BY MOUTH ONCE DAILY TAKE 30 MINUTES AFTER SAME MEAL EACH DAY.  . vitamin B-12 (CYANOCOBALAMIN) 1000 MCG tablet Take 1,000 mcg by mouth daily.  Marland Kitchen warfarin (COUMADIN) 5 MG tablet Take 5 mg by mouth at bedtime.   . Wound Dressings (RESTORE WOUND CARE DRESSING) PADS Apply topically.   No current facility-administered medications on file prior to visit.     Allergies  Allergen Reactions  . Apixaban Rash    . Rivaroxaban Rash    Review Of Systems:  Constitutional:   No  weight loss, night sweats,  Fevers, chills, + fatigue, or  lassitude.  HEENT:   No headaches,  Difficulty swallowing,  Tooth/dental problems, or  Sore throat,                No sneezing, itching, ear ache, nasal congestion, post nasal drip,   CV:  No chest pain,  Orthopnea, PND, swelling in lower extremities, anasarca, dizziness, palpitations, syncope.   GI  No heartburn, indigestion, abdominal pain, nausea, vomiting, diarrhea, change in bowel habits, loss of appetite, bloody stools.   Resp: + shortness of breath with exertion less  at rest.  No excess mucus, no productive cough,  No non-productive cough,  No coughing up of blood.  No change in color of mucus.  Occasional  wheezing.  No chest wall deformity  Skin: no rash or lesions.  GU: no dysuria, change in color of urine, no urgency or frequency.  No flank pain, no hematuria   MS:  No joint pain or swelling.  No decreased range of motion.  No back pain.  Psych:  No change in mood or affect. No depression or anxiety.  No memory loss.   Vital Signs BP 122/64 (BP Location: Left Arm, Cuff Size: Normal)   Pulse 86   Temp (!) 97.2 F (36.2 C) (Temporal)   Ht 6\' 1"  (1.854 m)   Wt 163 lb (73.9 kg)   SpO2 98%   BMI 21.51 kg/m    Physical Exam:  General- No distress,  A&Ox3, pleasant  ENT: No sinus tenderness, TM clear, pale nasal mucosa, no oral exudate,no post nasal drip, no LAN Cardiac: S1, S2, regular rate and rhythm, no murmur Chest: No wheeze/ rales/ dullness; no accessory muscle use, no nasal flaring, no sternal retractions, diminished per bases Abd.: Soft Non-tender, ND, BS + Ext: Right AKA, No clubbing cyanosis, edema noted in left leg Neuro: Deconditioned, MAE x 4. A&O x 3 Skin: No rashes, warm and dry, No lesions Psych: normal mood and behavior   Assessment/Plan  Resolving COPD Flare/ early infiltrate Plan Continue Flutter valve 4-5 breaths ,  4-5 times daily  Continue mucinex 1200 mg once daily  with a glass of water Continue lasix daily as prescribed Continue Advair and Combi vent and Spiriva  as you have been doing Continue Xopenex nebs as you have been doing for breakthrough shortness of breath.  Continue Nasal atrovent and Flonase Follow up CT Chest 10/2019 per Tyler [name] CXR today We will call you with results Sputum for Cx, AFB and Fungus Follow up tele visit  Consider  ,Daliresp, Trelegy/ Breztri/ PFT's with repeat flares Note your daily symptoms >remember "red flags" for COPD: Increase in cough, increase in sputum production, increase in shortness of breath or activity intolerance. If you notice these symptoms, please call to be seen. Follow up in 2 months with Tyler [name] ( video visit )  Please contact office for sooner follow up if symptoms do not improve or worsen or seek emergency care   I provided 45 minutes of care today   Magdalen Spatz, NP 06/12/2019  2:33 PM

## 2019-06-12 NOTE — Patient Instructions (Addendum)
It is good to see you today Im glad you are feeling better Continue Advair>> twice in the morning and twice at night  Rinse mouth after use  Continue Spiriva once daily in the morning. Rinse mouth after use Rescue  Continue Xopenex as needed for shortness of breath or wheezing Cotinue Combivent >> every 4 hours as needed for  Shortness of breath or wheezing Continue Lasix as you have been doing Follow up CT Chest without contrast in 6 months from previous scan ( Place under Dr. [name])  Continue Mucinex with a full glass of water to clear mucus Continue Nasal atrovent and Flonase as you have been doing. Note your daily symptoms >remember "red flags" for COPD: Increase in cough, increase in sputum production, increase in shortness of breath or activity intolerance, change in color of secretions. If you notice these symptoms, please call to be seen.  We will discuss sputum cultures ,Daliresp, Trelegy/ Breztri/ PFT's in 6 months. We will send sputum cups home with you today to collect a sputum specimen CXR today to ensure clearance of infiltrate We will call you with results Sputum for Culture for AFB, Fungus , Culture Follow up in 2 months video visit with Dr. [name] or NP. Please contact office for sooner follow up if symptoms do not improve or worsen or seek emergency care

## 2019-06-13 ENCOUNTER — Other Ambulatory Visit
Admission: RE | Admit: 2019-06-13 | Discharge: 2019-06-13 | Disposition: A | Discharge status: Home / Self Care | Payer: Medicare HMO | Source: Ambulatory Visit | Attending: Acute Care | Admitting: Acute Care

## 2019-06-13 DIAGNOSIS — R918 Other nonspecific abnormal finding of lung field: Secondary | ICD-10-CM | POA: Insufficient documentation

## 2019-06-13 DIAGNOSIS — I10 Essential (primary) hypertension: Secondary | ICD-10-CM | POA: Diagnosis not present

## 2019-06-13 DIAGNOSIS — I482 Chronic atrial fibrillation, unspecified: Secondary | ICD-10-CM | POA: Diagnosis not present

## 2019-06-14 ENCOUNTER — Telehealth: Payer: Self-pay

## 2019-06-14 ENCOUNTER — Telehealth: Payer: Self-pay | Admitting: Acute Care

## 2019-06-14 LAB — EXPECTORATED SPUTUM ASSESSMENT W GRAM STAIN, RFLX TO RESP C

## 2019-06-14 NOTE — Telephone Encounter (Signed)
Error

## 2019-06-14 NOTE — Telephone Encounter (Signed)
Per [facility] lab- pt did not give enough sputum for culture.  Spoke to pt, who stated that he would come back by the lab this week for recollection.  Nothing further is needed.

## 2019-06-16 LAB — ACID FAST SMEAR (AFB, MYCOBACTERIA): Acid Fast Smear: NEGATIVE

## 2019-06-20 ENCOUNTER — Other Ambulatory Visit
Admission: RE | Admit: 2019-06-20 | Discharge: 2019-06-20 | Disposition: A | Discharge status: Home / Self Care | Payer: Medicare HMO | Source: Ambulatory Visit | Attending: Internal Medicine | Admitting: Internal Medicine

## 2019-06-20 DIAGNOSIS — R918 Other nonspecific abnormal finding of lung field: Secondary | ICD-10-CM | POA: Diagnosis not present

## 2019-06-20 DIAGNOSIS — L97524 Non-pressure chronic ulcer of other part of left foot with necrosis of bone: Secondary | ICD-10-CM | POA: Diagnosis not present

## 2019-06-20 DIAGNOSIS — Z0000 Encounter for general adult medical examination without abnormal findings: Secondary | ICD-10-CM | POA: Diagnosis not present

## 2019-06-20 DIAGNOSIS — I739 Peripheral vascular disease, unspecified: Secondary | ICD-10-CM | POA: Diagnosis not present

## 2019-06-20 DIAGNOSIS — D6851 Activated protein C resistance: Secondary | ICD-10-CM | POA: Diagnosis not present

## 2019-06-20 DIAGNOSIS — R05 Cough: Secondary | ICD-10-CM | POA: Diagnosis not present

## 2019-06-20 DIAGNOSIS — F320 Major depressive disorder, single episode, mild: Secondary | ICD-10-CM | POA: Diagnosis not present

## 2019-06-20 DIAGNOSIS — J449 Chronic obstructive pulmonary disease, unspecified: Secondary | ICD-10-CM | POA: Diagnosis not present

## 2019-06-20 DIAGNOSIS — Z89611 Acquired absence of right leg above knee: Secondary | ICD-10-CM | POA: Diagnosis not present

## 2019-06-20 DIAGNOSIS — R059 Cough, unspecified: Secondary | ICD-10-CM

## 2019-06-20 DIAGNOSIS — M5000 Cervical disc disorder with myelopathy, unspecified cervical region: Secondary | ICD-10-CM | POA: Diagnosis not present

## 2019-06-20 DIAGNOSIS — Z79899 Other long term (current) drug therapy: Secondary | ICD-10-CM | POA: Diagnosis not present

## 2019-06-20 DIAGNOSIS — J9610 Chronic respiratory failure, unspecified whether with hypoxia or hypercapnia: Secondary | ICD-10-CM | POA: Diagnosis not present

## 2019-06-21 ENCOUNTER — Telehealth: Payer: Self-pay | Admitting: Internal Medicine

## 2019-06-21 DIAGNOSIS — L97529 Non-pressure chronic ulcer of other part of left foot with unspecified severity: Secondary | ICD-10-CM | POA: Diagnosis not present

## 2019-06-21 LAB — EXPECTORATED SPUTUM ASSESSMENT W GRAM STAIN, RFLX TO RESP C

## 2019-06-21 NOTE — Telephone Encounter (Signed)
Spoke to Tyler [name] with [facility] lab, who stated that second sputum sample is not acceptable either. Pt is producing a lot of spit in sample.   Dr. [name] would you like pt to attempt sample again. Please advise. Thanks

## 2019-06-26 DIAGNOSIS — J441 Chronic obstructive pulmonary disease with (acute) exacerbation: Secondary | ICD-10-CM | POA: Diagnosis not present

## 2019-06-28 NOTE — Telephone Encounter (Signed)
DK, please advise. Thanks 

## 2019-06-30 ENCOUNTER — Telehealth: Payer: Self-pay | Admitting: Internal Medicine

## 2019-06-30 DIAGNOSIS — M542 Cervicalgia: Secondary | ICD-10-CM | POA: Diagnosis not present

## 2019-06-30 DIAGNOSIS — M5481 Occipital neuralgia: Secondary | ICD-10-CM | POA: Diagnosis not present

## 2019-06-30 DIAGNOSIS — R05 Cough: Secondary | ICD-10-CM

## 2019-06-30 DIAGNOSIS — R059 Cough, unspecified: Secondary | ICD-10-CM

## 2019-06-30 NOTE — Telephone Encounter (Signed)
Spoke to Tyler [name] with [facility] lab, who is requesting that labs for sputum culture from 06/13/2019 be printed, signed by Tyler Form, NP and faxed back to lab.  Pt had an OV with Tyler Form, NP on 06/12/2019. Orders were placed during this visit and signed by Tyler Form, NP. (please see 06/12/2019 office visit encounter). Pt brought sputum samples back to lab on 06/13/2019. At that time our orders were canceled and reordered by lab under "provider not in system" therefore these results were not sent to Tyler Form, NP for review. I also received a call from lab on 06/21/2019 stating that sputum sample could not be accepted, however labs have been resulted.  Labs have been printed and will need to be signed by Tyler [name] when in office on 07/17/2019. I have made Tyler [name] aware of this information.    Tyler [name], please see results and advise. Thank you

## 2019-06-30 NOTE — Telephone Encounter (Signed)
Would you like for me to contact pt with results?

## 2019-06-30 NOTE — Telephone Encounter (Addendum)
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 Results are located within epic until "provider not in system" on 06/13/2019. Sooner would be great.  I can fax them to [place] office to have you sign once back in office.

## 2019-06-30 NOTE — Telephone Encounter (Signed)
Please see 06/30/2019 phone note.

## 2019-06-30 NOTE — Telephone Encounter (Signed)
Margie did the cultures grow anything?  I can sign the print out once I an at the office ( I think I am there 2/22) Do they need to be signed any sooner?

## 2019-06-30 NOTE — Telephone Encounter (Addendum)
Forms have been faxed to [facility] office.  Were leave encounter open to ensure follow up.  Sarah, were you able to locate results to review?

## 2019-06-30 NOTE — Telephone Encounter (Signed)
I will sign Monday if you fax them to the office. Will you send me a reminder message on Monday? I have 11 or 12 patient's Monday.I want to make sure I remember to look. Thanks so much Margie.

## 2019-06-30 NOTE — Telephone Encounter (Signed)
Yes. Thanks 

## 2019-07-03 NOTE — Telephone Encounter (Signed)
AFB was negative Sputum needs to be recollected as it was not acceptable for culture.  Thanks [employer].

## 2019-07-03 NOTE — Telephone Encounter (Signed)
Pt is aware of results and voiced his understanding.  Sputum culture has been reordered. Pt stated that he would come by this week to recollect.  Nothing further is needed at this time.

## 2019-07-03 NOTE — Addendum Note (Signed)
Addended by: Maryanna Shape A on: 07/03/2019 09:41 AM   Modules accepted: Orders

## 2019-07-04 DIAGNOSIS — Z89611 Acquired absence of right leg above knee: Secondary | ICD-10-CM | POA: Diagnosis not present

## 2019-07-04 DIAGNOSIS — I739 Peripheral vascular disease, unspecified: Secondary | ICD-10-CM | POA: Diagnosis not present

## 2019-07-04 DIAGNOSIS — M542 Cervicalgia: Secondary | ICD-10-CM | POA: Diagnosis not present

## 2019-07-04 DIAGNOSIS — M5481 Occipital neuralgia: Secondary | ICD-10-CM | POA: Diagnosis not present

## 2019-07-04 DIAGNOSIS — L97524 Non-pressure chronic ulcer of other part of left foot with necrosis of bone: Secondary | ICD-10-CM | POA: Diagnosis not present

## 2019-07-04 NOTE — Telephone Encounter (Signed)
Received signed orders from Eric Form, NP via fax. This has been faxed to [facility] lab as requested.  Blanch Media with lab has been made aware and voiced her understanding.  Nothing further is needed.

## 2019-07-05 ENCOUNTER — Other Ambulatory Visit
Admission: RE | Admit: 2019-07-05 | Discharge: 2019-07-05 | Disposition: A | Discharge status: Home / Self Care | Payer: Medicare HMO | Source: Ambulatory Visit | Attending: Acute Care | Admitting: Acute Care

## 2019-07-05 ENCOUNTER — Telehealth: Payer: Self-pay | Admitting: Internal Medicine

## 2019-07-05 DIAGNOSIS — R05 Cough: Secondary | ICD-10-CM | POA: Insufficient documentation

## 2019-07-05 NOTE — Telephone Encounter (Signed)
Order is within epic and was placed on 07/03/2019. I have spoken to [facility] with [facility] and made her aware of this information.  Nothing further is needed.

## 2019-07-06 ENCOUNTER — Telehealth: Payer: Self-pay | Admitting: Internal Medicine

## 2019-07-06 LAB — EXPECTORATED SPUTUM ASSESSMENT W GRAM STAIN, RFLX TO RESP C

## 2019-07-06 NOTE — Telephone Encounter (Signed)
Pt is aware of below message/recommendations.  Pt will call if he has increase secretions.  ATC stephanie with lab and make her aware as well. No answer with no option to leave vm.

## 2019-07-06 NOTE — Telephone Encounter (Signed)
Received call stephanie with [facility] lab, who stated that sputum sample is not acceptable as it only contains saliva. Pt has had to recollect three times.   Will route to Judson Roch to make aware. Would you like pt to attempt yo recollect?

## 2019-07-06 NOTE — Telephone Encounter (Signed)
Cancel the order. We will only collect if he has increase in his secretions. Thanks

## 2019-07-06 NOTE — Telephone Encounter (Signed)
Lm for pt to make him aware of below message.

## 2019-07-07 NOTE — Telephone Encounter (Signed)
Tyler [name] with [facility] lab is aware that sputum culture can be canceled.  Nothing further is needed.

## 2019-07-10 ENCOUNTER — Ambulatory Visit: Payer: Medicare HMO | Admitting: Physician Assistant

## 2019-07-11 ENCOUNTER — Encounter (INDEPENDENT_AMBULATORY_CARE_PROVIDER_SITE_OTHER): Payer: Self-pay | Admitting: Nurse Practitioner

## 2019-07-11 ENCOUNTER — Other Ambulatory Visit: Payer: Self-pay

## 2019-07-11 ENCOUNTER — Ambulatory Visit (INDEPENDENT_AMBULATORY_CARE_PROVIDER_SITE_OTHER): Payer: Medicare HMO | Admitting: Nurse Practitioner

## 2019-07-11 VITALS — BP 114/70 | HR 134 | Resp 16 | Ht 73.0 in | Wt 165.0 lb

## 2019-07-11 DIAGNOSIS — R600 Localized edema: Secondary | ICD-10-CM | POA: Diagnosis not present

## 2019-07-11 DIAGNOSIS — I10 Essential (primary) hypertension: Secondary | ICD-10-CM

## 2019-07-11 DIAGNOSIS — M7989 Other specified soft tissue disorders: Secondary | ICD-10-CM

## 2019-07-11 DIAGNOSIS — I7025 Atherosclerosis of native arteries of other extremities with ulceration: Secondary | ICD-10-CM

## 2019-07-12 DIAGNOSIS — I482 Chronic atrial fibrillation, unspecified: Secondary | ICD-10-CM | POA: Diagnosis not present

## 2019-07-12 LAB — FUNGUS CULTURE RESULT

## 2019-07-12 LAB — FUNGAL ORGANISM REFLEX

## 2019-07-12 LAB — FUNGUS CULTURE WITH STAIN

## 2019-07-14 ENCOUNTER — Ambulatory Visit (INDEPENDENT_AMBULATORY_CARE_PROVIDER_SITE_OTHER): Payer: Medicare HMO | Admitting: Vascular Surgery

## 2019-07-14 ENCOUNTER — Ambulatory Visit (INDEPENDENT_AMBULATORY_CARE_PROVIDER_SITE_OTHER): Payer: Medicare HMO | Admitting: Nurse Practitioner

## 2019-07-17 ENCOUNTER — Encounter (INDEPENDENT_AMBULATORY_CARE_PROVIDER_SITE_OTHER): Payer: Self-pay | Admitting: Nurse Practitioner

## 2019-07-17 NOTE — Progress Notes (Signed)
SUBJECTIVE:  Patient ID: Tyler [name], male    DOB: [birth year], 64 y.o.   MRN: [id number] Chief Complaint  Patient presents with  . Follow-up    3 month f/u    HPI  Tyler [name] is a 64 y.o. male the presents today for follow-up regarding his PAD.  Today the patient also has excessive swelling on his left lower extremity in addition to worsening of his wound on his right great toe.  The patient states that after discussing with his podiatrist they believe that it is in the best interest to possibly do an amputation of the great toe.  The patient does continue to have pain in his great toe with elevation of extended periods.  The patient does endorse being in a dependent position because of this most of the day.  The patient also has a previous history of heart failure which sometimes complicates his fluid status.  He denies any fever, chills, nausea, vomiting or diarrhea.    Past Medical History:  Diagnosis Date  . AICD (automatic cardioverter/defibrillator) present   . Arthritis   . Atrial fibrillation ([place])   . Cervical spinal stenosis    with neuropathy  . CHF (congestive heart failure) ([place])   . COPD (chronic obstructive pulmonary disease) ([place])   . Coronary artery disease   . Depression   . Dyspnea   . GERD (gastroesophageal reflux disease)   . Hypertension   . Myocardial infarction ([place])   . Peripheral vascular disease ([place])   . Presence of permanent cardiac pacemaker     Past Surgical History:  Procedure Laterality Date  . ABOVE KNEE LEG AMPUTATION Right    after below the knee amputation   . APPLICATION OF WOUND VAC Left 01/12/2018   Procedure: APPLICATION OF WOUND VAC;  Surgeon: Algernon Huxley, MD;  Location: [facility] ORS;  Service: Vascular;  Laterality: Left;  . BELOW KNEE LEG AMPUTATION Right   . CATARACT EXTRACTION, BILATERAL Bilateral   . ENDARTERECTOMY FEMORAL Left 08/11/2017   Procedure: ENDARTERECTOMY FEMORAL;  Surgeon: Algernon Huxley, MD;  Location: [facility]  ORS;  Service: Vascular;  Laterality: Left;  . HEMATOMA EVACUATION Left 01/12/2018   Procedure: EVACUATION HEMATOMA ( DRAINING OF SEROMA);  Surgeon: Algernon Huxley, MD;  Location: [facility] ORS;  Service: Vascular;  Laterality: Left;  . IMPLANTABLE CARDIOVERTER DEFIBRILLATOR (ICD) GENERATOR CHANGE Left 02/10/2017   Procedure: ICD GENERATOR CHANGE;  Surgeon: Isaias Cowman, MD;  Location: [facility] ORS;  Service: Cardiovascular;  Laterality: Left;  . INSERT / REPLACE / REMOVE PACEMAKER    . LOWER EXTREMITY ANGIOGRAPHY Left 06/07/2017   Procedure: LOWER EXTREMITY ANGIOGRAPHY;  Surgeon: Algernon Huxley, MD;  Location: [place] CV LAB;  Service: Cardiovascular;  Laterality: Left;  . LOWER EXTREMITY INTERVENTION  06/07/2017   Procedure: LOWER EXTREMITY INTERVENTION;  Surgeon: Algernon Huxley, MD;  Location: [place] CV LAB;  Service: Cardiovascular;;  . PERIPHERAL VASCULAR CATHETERIZATION Left 12/09/2015   Procedure: Lower Extremity Angiography;  Surgeon: Algernon Huxley, MD;  Location: [place] CV LAB;  Service: Cardiovascular;  Laterality: Left;  . PERIPHERAL VASCULAR CATHETERIZATION  12/09/2015   Procedure: Lower Extremity Intervention;  Surgeon: Algernon Huxley, MD;  Location: [place] CV LAB;  Service: Cardiovascular;;  . TONSILLECTOMY      Social History   Socioeconomic History  . Marital status: Married    Spouse name: Not on file  . Number of children: Not on file  . Years of education: Not  on file  . Highest education level: Not on file  Occupational History  . Not on file  Tobacco Use  . Smoking status: Former Smoker    Packs/day: 1.00    Years: 42.00    Pack years: 42.00    Types: Cigarettes    Quit date: 09/23/2013    Years since quitting: 5.8  . Smokeless tobacco: Never Used  Substance and Sexual Activity  . Alcohol use: No  . Drug use: No  . Sexual activity: Not on file  Other Topics Concern  . Not on file  Social History Narrative  . Not on file   Social Determinants of  Health   Financial Resource Strain:   . Difficulty of Paying Living Expenses: Not on file  Food Insecurity:   . Worried About Charity fundraiser in the Last Year: Not on file  . Ran Out of Food in the Last Year: Not on file  Transportation Needs:   . Lack of Transportation (Medical): Not on file  . Lack of Transportation (Non-Medical): Not on file  Physical Activity:   . Days of Exercise per Week: Not on file  . Minutes of Exercise per Session: Not on file  Stress:   . Feeling of Stress : Not on file  Social Connections:   . Frequency of Communication with Friends and Family: Not on file  . Frequency of Social Gatherings with Friends and Family: Not on file  . Attends Religious Services: Not on file  . Active Member of Clubs or Organizations: Not on file  . Attends Archivist Meetings: Not on file  . Marital Status: Not on file  Intimate Partner Violence:   . Fear of Current or Ex-Partner: Not on file  . Emotionally Abused: Not on file  . Physically Abused: Not on file  . Sexually Abused: Not on file    Family History  Problem Relation Age of Onset  . Cancer Mother   . Cancer Father   . Heart disease Father     Allergies  Allergen Reactions  . Apixaban Rash  . Rivaroxaban Rash     Review of Systems   Review of Systems: Negative Unless Checked Constitutional: [] Weight loss  [] Fever  [] Chills Cardiac: [] Chest pain   []  Atrial Fibrillation  [] Palpitations   [] Shortness of breath when laying flat   [] Shortness of breath with exertion. [] Shortness of breath at rest Vascular:  [] Pain in legs with walking   [] Pain in legs with standing [] Pain in legs when laying flat   [] Claudication    [] Pain in feet when laying flat    [] History of DVT   [] Phlebitis   [] Swelling in legs   [] Varicose veins   [x] Non-healing ulcers Pulmonary:   [x] Uses home oxygen   [] Productive cough   [] Hemoptysis   [] Wheeze  [x] COPD   [] Asthma Neurologic:  [] Dizziness   [] Seizures  [] Blackouts  [] History of stroke   [] History of TIA  [] Aphasia   [] Temporary Blindness   [] Weakness or numbness in arm   [] Weakness or numbness in leg Musculoskeletal:   [] Joint swelling   [] Joint pain   [x] Low back pain  []  History of Knee Replacement [] Arthritis [] back Surgeries  []  Spinal Stenosis    Hematologic:  [] Easy bruising  [] Easy bleeding   [] Hypercoagulable state   [] Anemic Gastrointestinal:  [] Diarrhea   [] Vomiting  [] Gastroesophageal reflux/heartburn   [] Difficulty swallowing. [] Abdominal pain Genitourinary:  [] Chronic kidney disease   [] Difficult urination  [] Anuric   [] Blood  in urine [] Frequent urination  [] Burning with urination   [] Hematuria Skin:  [] Rashes   [x] Ulcers [x] Wounds Psychological:  [] History of anxiety   []  History of major depression  []  Memory Difficulties      OBJECTIVE:   Physical Exam  BP 114/70 (BP Location: Right Arm)   Pulse (!) 134   Resp 16   Ht 6\' 1"  (1.854 m)   Wt 165 lb (74.8 kg)   BMI 21.77 kg/m   Gen: WD/WN, NAD Head: [place]/AT, No temporalis wasting.  Ear/Nose/Throat: Hearing grossly intact, nares w/o erythema or drainage Eyes: PER, EOMI, sclera nonicteric.  Neck: Supple, no masses.  No JVD.  Pulmonary:  Good air movement, no use of accessory muscles.  Cardiac: RRR Vascular:  3+ edema left lower extremity.  Wound on great toe of left foot Vessel Right Left  Radial Palpable Palpable  Dorsalis Pedis  Palpable  Posterior Tibial  Palpable   Gastrointestinal: soft, non-distended. No guarding/no peritoneal signs.  Musculoskeletal: Right above-knee amputation.  No deformity or atrophy.  Neurologic: Pain and light touch intact in extremities.  Symmetrical.  Speech is fluent. Motor exam as listed above. Psychiatric: Judgment intact, Mood & affect appropriate for pt's clinical situation. Dermatologic: No Venous rashes. No Ulcers Noted.  No changes consistent with cellulitis. Lymph : No Cervical lymphadenopathy, no lichenification or skin changes of chronic  lymphedema.       ASSESSMENT AND PLAN:  1. Leg swelling Today patient did have significant edema.  This is likely multifactorial in cause related to his being dependent for extended periods in addition to his heart failure.  We have placed the patient in [place] wraps today.  The patient will present to our office on a weekly basis to change his wraps.  We will reevaluate his lower extremity edema in 4 weeks.  2. Atherosclerosis of native arteries of the extremities with ulceration ([place]) Discussed patient's case directly with Dr. Lucky Cowboy and based on the patient's results of his last angiogram he should have adequate perfusion for marginal wound healing.  Due to the fact that the patient is a complicated angiogram case, requiring an antegrade approach, we will defer doing an angiogram unless the patient's toe amputation shows signs that it is not healing.  The patient has had a femoral endarterectomy on the left as well as multiple revascularizations at this point further review vascularization may not be of much benefit.  We will keep close follow-up with the patient since he will be in the office on a weekly basis for Un[place] wraps.  3. Essential hypertension, benign Good blood pressure control today.  No additional medications needed.   Current Outpatient Medications on File Prior to Visit  Medication Sig Dispense Refill  . acetaminophen (TYLENOL) 500 MG tablet Take 1,000 mg by mouth daily as needed for moderate pain or headache.    . ALPRAZolam (XANAX) 1 MG tablet Take 1 mg by mouth 2 (two) times daily.     Marland Kitchen aspirin EC 81 MG tablet Take 1 tablet (81 mg total) by mouth daily. (Patient taking differently: Take 81 mg by mouth daily after supper. ) 150 tablet 2  . citalopram (CELEXA) 20 MG tablet Take 20 mg by mouth at bedtime.   11  . docusate sodium (COLACE) 100 MG capsule Take 100 mg by mouth at bedtime as needed (for constipation.).     Marland Kitchen ENTRESTO 24-26 MG Take 1 tablet by mouth 2 (two) times  daily.    . fluticasone (FLONASE) 50 MCG/ACT  nasal spray Place 2 sprays into both nostrils daily.    . fluticasone-salmeterol (ADVAIR HFA) 115-21 MCG/ACT inhaler USE 2 INHALATIONS ORALLY EVERY 12 HOURS 36 Inhaler 5  . furosemide (LASIX) 20 MG tablet Take 20 mg by mouth daily.     Marland Kitchen guaiFENesin (MUCINEX) 600 MG 12 hr tablet Take 600 mg by mouth 2 (two) times daily.    Marland Kitchen ipratropium (ATROVENT) 0.03 % nasal spray Place 2 sprays into both nostrils 2 (two) times daily.     . Ipratropium-Albuterol (COMBIVENT RESPIMAT) 20-100 MCG/ACT AERS respimat Inhale 1 puff into the lungs every 4 (four) hours.     Marland Kitchen levalbuterol (XOPENEX) 1.25 MG/3ML nebulizer solution Take 1.25 mg (3 mLs total) by nebulization every 4 (four) hours. (Patient taking differently: Take 3 mLs by nebulization every 4 (four) hours as needed for wheezing or shortness of breath ((typically twice daily)). ) 72 mL 12  . loratadine (CLARITIN) 10 MG tablet Take 10 mg by mouth at bedtime.    . lovastatin (MEVACOR) 20 MG tablet Take 20 mg by mouth at bedtime.    . nitroGLYCERIN (NITROSTAT) 0.4 MG SL tablet Place 0.4 mg under the tongue every 5 (five) minutes as needed for chest pain.    Marland Kitchen nystatin cream (MYCOSTATIN) APPLY TO AFFECTED AREA TWICE A DAY  0  . oxyCODONE-acetaminophen (PERCOCET/ROXICET) 5-325 MG tablet Take 1 tablet by mouth every 4 (four) hours as needed for severe pain.    . pantoprazole (PROTONIX) 40 MG tablet Take 40 mg by mouth daily before breakfast.     . ramipril (ALTACE) 10 MG capsule Take 10 mg by mouth at bedtime.     Marland Kitchen Respiratory Therapy Supplies (FLUTTER) DEVI Use as directed. 1 each 0  . SPIRIVA RESPIMAT 1.25 MCG/ACT AERS INHALE 2 PUFFS BY MOUTH INTO THE LUNGS DAILY 4 g 6  . tamsulosin (FLOMAX) 0.4 MG CAPS capsule TAKE 1 CAPSULE (0.4 MG TOTAL) BY MOUTH ONCE DAILY TAKE 30 MINUTES AFTER SAME MEAL EACH DAY.  11  . vitamin B-12 (CYANOCOBALAMIN) 1000 MCG tablet Take 1,000 mcg by mouth daily.    Marland Kitchen warfarin (COUMADIN) 5 MG  tablet Take 5 mg by mouth at bedtime.     . Wound Dressings (RESTORE WOUND CARE DRESSING) PADS Apply topically.     No current facility-administered medications on file prior to visit.    There are no Patient Instructions on file for this visit. No follow-ups on file.   Kris Hartmann, NP  This note was completed with Sales executive.  Any errors are purely unintentional.

## 2019-07-18 ENCOUNTER — Encounter (INDEPENDENT_AMBULATORY_CARE_PROVIDER_SITE_OTHER): Payer: Self-pay | Admitting: Nurse Practitioner

## 2019-07-18 ENCOUNTER — Encounter (INDEPENDENT_AMBULATORY_CARE_PROVIDER_SITE_OTHER): Payer: Medicare HMO

## 2019-07-18 ENCOUNTER — Other Ambulatory Visit: Payer: Self-pay

## 2019-07-18 ENCOUNTER — Ambulatory Visit (INDEPENDENT_AMBULATORY_CARE_PROVIDER_SITE_OTHER): Payer: Medicare HMO | Admitting: Nurse Practitioner

## 2019-07-18 VITALS — BP 100/67 | HR 82 | Resp 16 | Ht 73.0 in | Wt 165.0 lb

## 2019-07-18 DIAGNOSIS — R600 Localized edema: Secondary | ICD-10-CM

## 2019-07-18 DIAGNOSIS — M7989 Other specified soft tissue disorders: Secondary | ICD-10-CM

## 2019-07-18 NOTE — Progress Notes (Signed)
History of Present Illness  There is no documented history at this time  Assessments & Plan   There are no diagnoses linked to this encounter.    Additional instructions  Subjective:  Patient presents with venous ulcer of the Left lower extremity.    Procedure:  3 layer unna wrap was placed Left lower extremity.   Plan:   Follow up in one week.  

## 2019-07-21 DIAGNOSIS — L97529 Non-pressure chronic ulcer of other part of left foot with unspecified severity: Secondary | ICD-10-CM | POA: Diagnosis not present

## 2019-07-24 DIAGNOSIS — J441 Chronic obstructive pulmonary disease with (acute) exacerbation: Secondary | ICD-10-CM | POA: Diagnosis not present

## 2019-07-25 ENCOUNTER — Ambulatory Visit (INDEPENDENT_AMBULATORY_CARE_PROVIDER_SITE_OTHER): Payer: Medicare HMO | Admitting: Nurse Practitioner

## 2019-07-25 ENCOUNTER — Other Ambulatory Visit: Payer: Self-pay

## 2019-07-25 ENCOUNTER — Other Ambulatory Visit: Payer: Self-pay | Admitting: Podiatry

## 2019-07-25 ENCOUNTER — Encounter (INDEPENDENT_AMBULATORY_CARE_PROVIDER_SITE_OTHER): Payer: Self-pay

## 2019-07-25 VITALS — BP 125/77 | HR 82 | Resp 16

## 2019-07-25 DIAGNOSIS — M7989 Other specified soft tissue disorders: Secondary | ICD-10-CM | POA: Diagnosis not present

## 2019-07-25 DIAGNOSIS — I739 Peripheral vascular disease, unspecified: Secondary | ICD-10-CM | POA: Diagnosis not present

## 2019-07-25 DIAGNOSIS — I482 Chronic atrial fibrillation, unspecified: Secondary | ICD-10-CM | POA: Diagnosis not present

## 2019-07-25 DIAGNOSIS — I2510 Atherosclerotic heart disease of native coronary artery without angina pectoris: Secondary | ICD-10-CM | POA: Diagnosis not present

## 2019-07-25 DIAGNOSIS — Z89611 Acquired absence of right leg above knee: Secondary | ICD-10-CM | POA: Diagnosis not present

## 2019-07-25 DIAGNOSIS — L97524 Non-pressure chronic ulcer of other part of left foot with necrosis of bone: Secondary | ICD-10-CM | POA: Diagnosis not present

## 2019-07-25 NOTE — Progress Notes (Signed)
History of Present Illness  There is no documented history at this time  Assessments & Plan   There are no diagnoses linked to this encounter.    Additional instructions  Subjective:  Patient presents with venous ulcer of the Left lower extremity.    Procedure:  3 layer unna wrap was placed Left lower extremity.   Plan:   Follow up in one week.  

## 2019-07-26 DIAGNOSIS — I70229 Atherosclerosis of native arteries of extremities with rest pain, unspecified extremity: Secondary | ICD-10-CM | POA: Diagnosis not present

## 2019-07-26 DIAGNOSIS — K219 Gastro-esophageal reflux disease without esophagitis: Secondary | ICD-10-CM | POA: Diagnosis not present

## 2019-07-26 DIAGNOSIS — I5022 Chronic systolic (congestive) heart failure: Secondary | ICD-10-CM | POA: Diagnosis not present

## 2019-07-26 DIAGNOSIS — Z86010 Personal history of colonic polyps: Secondary | ICD-10-CM | POA: Diagnosis not present

## 2019-07-26 DIAGNOSIS — I482 Chronic atrial fibrillation, unspecified: Secondary | ICD-10-CM | POA: Diagnosis not present

## 2019-07-26 DIAGNOSIS — K5909 Other constipation: Secondary | ICD-10-CM | POA: Diagnosis not present

## 2019-07-26 DIAGNOSIS — I2510 Atherosclerotic heart disease of native coronary artery without angina pectoris: Secondary | ICD-10-CM | POA: Diagnosis not present

## 2019-07-26 DIAGNOSIS — I10 Essential (primary) hypertension: Secondary | ICD-10-CM | POA: Diagnosis not present

## 2019-08-01 ENCOUNTER — Ambulatory Visit (INDEPENDENT_AMBULATORY_CARE_PROVIDER_SITE_OTHER): Payer: Medicare HMO | Admitting: Nurse Practitioner

## 2019-08-01 ENCOUNTER — Encounter (INDEPENDENT_AMBULATORY_CARE_PROVIDER_SITE_OTHER): Payer: Self-pay | Admitting: Nurse Practitioner

## 2019-08-01 ENCOUNTER — Other Ambulatory Visit
Admission: RE | Admit: 2019-08-01 | Discharge: 2019-08-01 | Disposition: A | Discharge status: Home / Self Care | Payer: Medicare HMO | Source: Ambulatory Visit | Attending: Podiatry | Admitting: Podiatry

## 2019-08-01 ENCOUNTER — Other Ambulatory Visit: Payer: Self-pay

## 2019-08-01 VITALS — BP 117/65 | HR 86

## 2019-08-01 DIAGNOSIS — R600 Localized edema: Secondary | ICD-10-CM

## 2019-08-01 DIAGNOSIS — M7989 Other specified soft tissue disorders: Secondary | ICD-10-CM

## 2019-08-01 DIAGNOSIS — Z01818 Encounter for other preprocedural examination: Secondary | ICD-10-CM | POA: Insufficient documentation

## 2019-08-01 HISTORY — DX: Anxiety disorder, unspecified: F41.9

## 2019-08-01 NOTE — Patient Instructions (Signed)
Your procedure is scheduled on: Friday August 04, 2019 Report to Day Surgery. To find out your arrival time please call [phone] between 1PM - 3PM on Thursday August 03, 2019.  Remember: Instructions that are not followed completely may result in serious medical risk,  up to and including death, or upon the discretion of your surgeon and anesthesiologist your  surgery may need to be rescheduled.     _X__ 1. Do not eat food after midnight the night before your procedure.                 No gum chewing or hard candies. You may drink clear liquids up to 2 hours                 before you are scheduled to arrive for your surgery- DO not drink clear                 liquids within 2 hours of the start of your surgery.                 Clear Liquids include:  water, apple juice without pulp, clear Gatorade, G2 or                  Gatorade Zero (avoid Red/Purple/Blue), Black Coffee or Tea (Do not add                 anything to coffee or tea). NO carbonated drinks.   __X__2.   Complete the carbohydrate drink provided to you, 2 hours before arrival.  __X__3.  On the morning of surgery brush your teeth with toothpaste and water, you                may rinse your mouth with mouthwash if you wish.  Do not swallow any toothpaste of mouthwash.     _X__ 4.  No Alcohol for 24 hours before or after surgery.   _X__ 5.  Do Not Smoke or use e-cigarettes For 24 Hours Prior to Your Surgery.                 Do not use any chewable tobacco products for at least 6 hours prior to                 surgery.  __X__  6.  Notify your doctor if there is any change in your medical condition      (cold, fever, infections).     Do not wear jewelry, make-up, hairpins, clips or nail polish. Do not wear lotions, powders, or perfumes. You may wear deodorant. Do not shave 48 hours prior to surgery. Men may shave face and neck. Do not bring valuables to the hospital.    [facility] is not  responsible for any belongings or valuables.  Contacts, dentures or bridgework may not be worn into surgery. Leave your suitcase in the car. After surgery it may be brought to your room. For patients admitted to the hospital, discharge time is determined by your treatment team.   Patients discharged the day of surgery will not be allowed to drive home.   Make arrangements for someone to be with you for the first 24 hours of your Same Day Discharge.   __X__ Take these medicines the morning of surgery with A SIP OF WATER:    1. ENTRESTO 24-26 MG  2. metoprolol succinate (TOPROL-XL)  3. pantoprazole (PROTONIX)     __X__ Use CHG Soap as directed  __X__ Use inhalers on the day of surgery   fluticasone-salmeterol (ADVAIR HFA)  SPIRIVA RESPIMAT 1.25  __X__ Stop Coumadin as advised by your provider.   __X__ Stop Anti-inflammatories such as ibuprofen, Aleve, naproxen and or BC powders.    __X__ Stop supplements until after surgery.    __X__ Do not start any herbal supplements before your surgery.

## 2019-08-01 NOTE — Progress Notes (Signed)
History of Present Illness  There is no documented history at this time  Assessments & Plan   There are no diagnoses linked to this encounter.    Additional instructions  Subjective:  Patient presents with venous ulcer of the Right lower extremity.    Procedure:  3 layer unna wrap was placed Right lower extremity.   Plan:   Follow up in one week.   

## 2019-08-02 ENCOUNTER — Other Ambulatory Visit
Admission: RE | Admit: 2019-08-02 | Discharge: 2019-08-02 | Disposition: A | Discharge status: Home / Self Care | Payer: Medicare HMO | Source: Ambulatory Visit | Attending: Podiatry | Admitting: Podiatry

## 2019-08-02 DIAGNOSIS — Z01812 Encounter for preprocedural laboratory examination: Secondary | ICD-10-CM | POA: Insufficient documentation

## 2019-08-02 DIAGNOSIS — Z20822 Contact with and (suspected) exposure to covid-19: Secondary | ICD-10-CM | POA: Diagnosis not present

## 2019-08-02 LAB — SARS CORONAVIRUS 2 (TAT 6-24 HRS): SARS Coronavirus 2: NEGATIVE

## 2019-08-04 ENCOUNTER — Ambulatory Visit: Payer: Medicare HMO | Admitting: Certified Registered Nurse Anesthetist

## 2019-08-04 ENCOUNTER — Encounter
Admission: RE | Disposition: A | Discharge status: Home / Self Care | Payer: Self-pay | Source: Home / Self Care | Attending: Podiatry

## 2019-08-04 ENCOUNTER — Encounter: Payer: Self-pay | Admitting: Podiatry

## 2019-08-04 ENCOUNTER — Other Ambulatory Visit: Payer: Self-pay

## 2019-08-04 ENCOUNTER — Ambulatory Visit
Admission: RE | Admit: 2019-08-04 | Discharge: 2019-08-04 | Disposition: A | Discharge status: Home / Self Care | Payer: Medicare HMO | Attending: Podiatry | Admitting: Podiatry

## 2019-08-04 DIAGNOSIS — Z950 Presence of cardiac pacemaker: Secondary | ICD-10-CM | POA: Diagnosis not present

## 2019-08-04 DIAGNOSIS — L98491 Non-pressure chronic ulcer of skin of other sites limited to breakdown of skin: Secondary | ICD-10-CM | POA: Diagnosis not present

## 2019-08-04 DIAGNOSIS — I2510 Atherosclerotic heart disease of native coronary artery without angina pectoris: Secondary | ICD-10-CM | POA: Insufficient documentation

## 2019-08-04 DIAGNOSIS — I081 Rheumatic disorders of both mitral and tricuspid valves: Secondary | ICD-10-CM | POA: Insufficient documentation

## 2019-08-04 DIAGNOSIS — I70268 Atherosclerosis of native arteries of extremities with gangrene, other extremity: Secondary | ICD-10-CM | POA: Insufficient documentation

## 2019-08-04 DIAGNOSIS — K219 Gastro-esophageal reflux disease without esophagitis: Secondary | ICD-10-CM | POA: Insufficient documentation

## 2019-08-04 DIAGNOSIS — Z9842 Cataract extraction status, left eye: Secondary | ICD-10-CM | POA: Insufficient documentation

## 2019-08-04 DIAGNOSIS — Z86010 Personal history of colonic polyps: Secondary | ICD-10-CM | POA: Insufficient documentation

## 2019-08-04 DIAGNOSIS — Z9981 Dependence on supplemental oxygen: Secondary | ICD-10-CM | POA: Diagnosis not present

## 2019-08-04 DIAGNOSIS — F329 Major depressive disorder, single episode, unspecified: Secondary | ICD-10-CM | POA: Diagnosis not present

## 2019-08-04 DIAGNOSIS — Z801 Family history of malignant neoplasm of trachea, bronchus and lung: Secondary | ICD-10-CM | POA: Insufficient documentation

## 2019-08-04 DIAGNOSIS — D6851 Activated protein C resistance: Secondary | ICD-10-CM | POA: Insufficient documentation

## 2019-08-04 DIAGNOSIS — Z888 Allergy status to other drugs, medicaments and biological substances status: Secondary | ICD-10-CM | POA: Diagnosis not present

## 2019-08-04 DIAGNOSIS — I110 Hypertensive heart disease with heart failure: Secondary | ICD-10-CM | POA: Insufficient documentation

## 2019-08-04 DIAGNOSIS — Z8052 Family history of malignant neoplasm of bladder: Secondary | ICD-10-CM | POA: Insufficient documentation

## 2019-08-04 DIAGNOSIS — I252 Old myocardial infarction: Secondary | ICD-10-CM | POA: Insufficient documentation

## 2019-08-04 DIAGNOSIS — I96 Gangrene, not elsewhere classified: Secondary | ICD-10-CM | POA: Diagnosis present

## 2019-08-04 DIAGNOSIS — E782 Mixed hyperlipidemia: Secondary | ICD-10-CM | POA: Diagnosis not present

## 2019-08-04 DIAGNOSIS — Z7982 Long term (current) use of aspirin: Secondary | ICD-10-CM | POA: Diagnosis not present

## 2019-08-04 DIAGNOSIS — Z7951 Long term (current) use of inhaled steroids: Secondary | ICD-10-CM | POA: Insufficient documentation

## 2019-08-04 DIAGNOSIS — L97524 Non-pressure chronic ulcer of other part of left foot with necrosis of bone: Secondary | ICD-10-CM | POA: Diagnosis not present

## 2019-08-04 DIAGNOSIS — F419 Anxiety disorder, unspecified: Secondary | ICD-10-CM | POA: Insufficient documentation

## 2019-08-04 DIAGNOSIS — D689 Coagulation defect, unspecified: Secondary | ICD-10-CM | POA: Diagnosis not present

## 2019-08-04 DIAGNOSIS — Z7901 Long term (current) use of anticoagulants: Secondary | ICD-10-CM | POA: Insufficient documentation

## 2019-08-04 DIAGNOSIS — Z9841 Cataract extraction status, right eye: Secondary | ICD-10-CM | POA: Insufficient documentation

## 2019-08-04 DIAGNOSIS — J449 Chronic obstructive pulmonary disease, unspecified: Secondary | ICD-10-CM | POA: Diagnosis not present

## 2019-08-04 DIAGNOSIS — Z79899 Other long term (current) drug therapy: Secondary | ICD-10-CM | POA: Insufficient documentation

## 2019-08-04 DIAGNOSIS — Z8249 Family history of ischemic heart disease and other diseases of the circulatory system: Secondary | ICD-10-CM | POA: Insufficient documentation

## 2019-08-04 DIAGNOSIS — Z89611 Acquired absence of right leg above knee: Secondary | ICD-10-CM | POA: Insufficient documentation

## 2019-08-04 DIAGNOSIS — J441 Chronic obstructive pulmonary disease with (acute) exacerbation: Secondary | ICD-10-CM | POA: Diagnosis not present

## 2019-08-04 DIAGNOSIS — Z87891 Personal history of nicotine dependence: Secondary | ICD-10-CM | POA: Diagnosis not present

## 2019-08-04 DIAGNOSIS — I5022 Chronic systolic (congestive) heart failure: Secondary | ICD-10-CM | POA: Insufficient documentation

## 2019-08-04 DIAGNOSIS — Z961 Presence of intraocular lens: Secondary | ICD-10-CM | POA: Insufficient documentation

## 2019-08-04 DIAGNOSIS — R0602 Shortness of breath: Secondary | ICD-10-CM | POA: Diagnosis not present

## 2019-08-04 DIAGNOSIS — I70245 Atherosclerosis of native arteries of left leg with ulceration of other part of foot: Secondary | ICD-10-CM | POA: Diagnosis not present

## 2019-08-04 HISTORY — PX: AMPUTATION TOE: SHX6595

## 2019-08-04 SURGERY — AMPUTATION, TOE
Anesthesia: General | Site: Toe | Laterality: Left

## 2019-08-04 MED ORDER — PHENYLEPHRINE HCL (PRESSORS) 10 MG/ML IV SOLN
INTRAVENOUS | Status: AC
  Filled 2019-08-04: qty 1

## 2019-08-04 MED ORDER — ONDANSETRON HCL 4 MG/2ML IJ SOLN: 4.0000 mg | Freq: Once | INTRAMUSCULAR | Status: DC | PRN

## 2019-08-04 MED ORDER — MIDAZOLAM HCL 2 MG/2ML IJ SOLN
INTRAMUSCULAR | Status: AC
  Filled 2019-08-04: qty 2

## 2019-08-04 MED ORDER — LACTATED RINGERS IV SOLN: INTRAVENOUS | Status: DC

## 2019-08-04 MED ORDER — ONDANSETRON HCL 4 MG/2ML IJ SOLN: 4.0000 mg | Freq: Four times a day (QID) | INTRAMUSCULAR | Status: DC | PRN

## 2019-08-04 MED ORDER — OXYCODONE-ACETAMINOPHEN 5-325 MG PO TABS: 1.0000 | ORAL_TABLET | Freq: Four times a day (QID) | ORAL | 0 refills | Status: DC | PRN

## 2019-08-04 MED ORDER — PHENYLEPHRINE HCL (PRESSORS) 10 MG/ML IV SOLN
INTRAVENOUS | Status: DC | PRN
  Administered 2019-08-04: 50 ug via INTRAVENOUS

## 2019-08-04 MED ORDER — ACETAMINOPHEN 10 MG/ML IV SOLN: 1000.0000 mg | Freq: Once | INTRAVENOUS | Status: DC | PRN

## 2019-08-04 MED ORDER — OXYCODONE HCL 5 MG/5ML PO SOLN: 5.0000 mg | Freq: Once | ORAL | Status: DC | PRN

## 2019-08-04 MED ORDER — CEFAZOLIN SODIUM-DEXTROSE 2-4 GM/100ML-% IV SOLN
2.0000 g | INTRAVENOUS | Status: AC
  Administered 2019-08-04: 2 g via INTRAVENOUS

## 2019-08-04 MED ORDER — ACETAMINOPHEN 10 MG/ML IV SOLN
INTRAVENOUS | Status: AC
  Filled 2019-08-04: qty 100

## 2019-08-04 MED ORDER — FENTANYL CITRATE (PF) 100 MCG/2ML IJ SOLN
INTRAMUSCULAR | Status: AC
  Filled 2019-08-04: qty 2

## 2019-08-04 MED ORDER — PROPOFOL 500 MG/50ML IV EMUL
INTRAVENOUS | Status: AC
  Filled 2019-08-04: qty 50

## 2019-08-04 MED ORDER — OXYCODONE HCL 5 MG PO TABS: 5.0000 mg | ORAL_TABLET | Freq: Once | ORAL | Status: DC | PRN

## 2019-08-04 MED ORDER — BUPIVACAINE LIPOSOME 1.3 % IJ SUSP
INTRAMUSCULAR | Status: AC
  Filled 2019-08-04: qty 20

## 2019-08-04 MED ORDER — LIDOCAINE HCL (PF) 1 % IJ SOLN
INTRAMUSCULAR | Status: AC
  Filled 2019-08-04: qty 30

## 2019-08-04 MED ORDER — BUPIVACAINE HCL 0.5 % IJ SOLN
INTRAMUSCULAR | Status: DC | PRN
  Administered 2019-08-04: 5 mL

## 2019-08-04 MED ORDER — GLYCOPYRROLATE 0.2 MG/ML IJ SOLN
INTRAMUSCULAR | Status: AC
  Filled 2019-08-04: qty 1

## 2019-08-04 MED ORDER — SODIUM CHLORIDE 0.9 % IV SOLN
INTRAVENOUS | Status: DC | PRN
  Administered 2019-08-04: 50 ug/min via INTRAVENOUS

## 2019-08-04 MED ORDER — FENTANYL CITRATE (PF) 100 MCG/2ML IJ SOLN
INTRAMUSCULAR | Status: DC | PRN
  Administered 2019-08-04 (×2): 50 ug via INTRAVENOUS
  Administered 2019-08-04 (×2): 25 ug via INTRAVENOUS

## 2019-08-04 MED ORDER — PROPOFOL 500 MG/50ML IV EMUL
INTRAVENOUS | Status: DC | PRN
  Administered 2019-08-04: 75 ug/kg/min via INTRAVENOUS

## 2019-08-04 MED ORDER — MIDAZOLAM HCL 2 MG/2ML IJ SOLN
INTRAMUSCULAR | Status: DC | PRN
  Administered 2019-08-04: 2 mg via INTRAVENOUS

## 2019-08-04 MED ORDER — POVIDONE-IODINE 7.5 % EX SOLN
Freq: Once | CUTANEOUS | Status: DC
  Filled 2019-08-04: qty 118

## 2019-08-04 MED ORDER — PROPOFOL 10 MG/ML IV BOLUS
INTRAVENOUS | Status: AC
  Filled 2019-08-04: qty 20

## 2019-08-04 MED ORDER — FENTANYL CITRATE (PF) 100 MCG/2ML IJ SOLN: 25.0000 ug | INTRAMUSCULAR | Status: DC | PRN

## 2019-08-04 MED ORDER — CEFAZOLIN SODIUM-DEXTROSE 2-4 GM/100ML-% IV SOLN
INTRAVENOUS | Status: AC
  Filled 2019-08-04: qty 100

## 2019-08-04 MED ORDER — BUPIVACAINE LIPOSOME 1.3 % IJ SUSP
INTRAMUSCULAR | Status: DC | PRN
  Administered 2019-08-04: 5 mL
  Administered 2019-08-04: 10 mL

## 2019-08-04 MED ORDER — ACETAMINOPHEN 10 MG/ML IV SOLN
INTRAVENOUS | Status: DC | PRN
  Administered 2019-08-04: 1000 mg via INTRAVENOUS

## 2019-08-04 MED ORDER — ONDANSETRON HCL 4 MG PO TABS: 4.0000 mg | ORAL_TABLET | Freq: Four times a day (QID) | ORAL | Status: DC | PRN

## 2019-08-04 SURGICAL SUPPLY — 43 items
BLADE OSC/SAGITTAL MD 5.5X18 (BLADE) ×2 IMPLANT
BLADE SURG MINI STRL (BLADE) ×2 IMPLANT
BNDG CONFORM 2 STRL LF (GAUZE/BANDAGES/DRESSINGS) IMPLANT
BNDG CONFORM 3 STRL LF (GAUZE/BANDAGES/DRESSINGS) ×4 IMPLANT
BNDG ELASTIC 4X5.8 VLCR NS LF (GAUZE/BANDAGES/DRESSINGS) ×2 IMPLANT
BNDG ESMARK 4X12 TAN STRL LF (GAUZE/BANDAGES/DRESSINGS) ×2 IMPLANT
BNDG GAUZE 4.5X4.1 6PLY STRL (MISCELLANEOUS) ×2 IMPLANT
CANISTER SUCT 1200ML W/VALVE (MISCELLANEOUS) ×2 IMPLANT
COVER WAND RF STERILE (DRAPES) ×2 IMPLANT
CUFF TOURN SGL QUICK 12 (TOURNIQUET CUFF) IMPLANT
CUFF TOURN SGL QUICK 18X4 (TOURNIQUET CUFF) IMPLANT
DRAPE FLUOR MINI C-ARM 54X84 (DRAPES) IMPLANT
DRAPE XRAY CASSETTE 23X24 (DRAPES) IMPLANT
DURAPREP 26ML APPLICATOR (WOUND CARE) ×2 IMPLANT
ELECT REM PT RETURN 9FT ADLT (ELECTROSURGICAL) ×2
ELECTRODE REM PT RTRN 9FT ADLT (ELECTROSURGICAL) ×1 IMPLANT
GAUZE PACKING IODOFORM 1/2 (PACKING) IMPLANT
GAUZE SPONGE 4X4 12PLY STRL (GAUZE/BANDAGES/DRESSINGS) ×2 IMPLANT
GAUZE XEROFORM 1X8 LF (GAUZE/BANDAGES/DRESSINGS) ×2 IMPLANT
GLOVE BIO SURGEON STRL SZ7.5 (GLOVE) ×2 IMPLANT
GLOVE INDICATOR 8.0 STRL GRN (GLOVE) ×2 IMPLANT
GOWN STRL REUS W/ TWL LRG LVL3 (GOWN DISPOSABLE) ×2 IMPLANT
GOWN STRL REUS W/TWL LRG LVL3 (GOWN DISPOSABLE) ×2
KIT TURNOVER KIT A (KITS) ×2 IMPLANT
LABEL OR SOLS (LABEL) ×2 IMPLANT
NEEDLE FILTER BLUNT 18X 1/2SAF (NEEDLE) ×1
NEEDLE FILTER BLUNT 18X1 1/2 (NEEDLE) ×1 IMPLANT
NEEDLE HYPO 25X1 1.5 SAFETY (NEEDLE) ×2 IMPLANT
NS IRRIG 500ML POUR BTL (IV SOLUTION) ×2 IMPLANT
PACK EXTREMITY ARMC (MISCELLANEOUS) ×2 IMPLANT
PAD ABD DERMACEA PRESS 5X9 (GAUZE/BANDAGES/DRESSINGS) IMPLANT
PULSAVAC PLUS IRRIG FAN TIP (DISPOSABLE)
SHIELD FULL FACE ANTIFOG 7M (MISCELLANEOUS) ×2 IMPLANT
SOL .9 NS 3000ML IRR  AL (IV SOLUTION)
SOL .9 NS 3000ML IRR UROMATIC (IV SOLUTION) IMPLANT
STOCKINETTE M/LG 89821 (MISCELLANEOUS) ×2 IMPLANT
STRAP SAFETY 5IN WIDE (MISCELLANEOUS) ×2 IMPLANT
SUT ETHILON 3-0 FS-10 30 BLK (SUTURE) ×2
SUT ETHILON 5-0 FS-2 18 BLK (SUTURE) ×2 IMPLANT
SUT VIC AB 4-0 FS2 27 (SUTURE) ×2 IMPLANT
SUTURE EHLN 3-0 FS-10 30 BLK (SUTURE) ×1 IMPLANT
SYR 10ML LL (SYRINGE) ×6 IMPLANT
TIP FAN IRRIG PULSAVAC PLUS (DISPOSABLE) IMPLANT

## 2019-08-04 NOTE — Op Note (Signed)
Operative note   Surgeon:Rajah Lamba Lawyer: None    Preop diagnosis: Gangrene left great toe    Postop diagnosis: Same    Procedure: Amputation left great toe    EBL: Minimal    Anesthesia:local and IV sedation.  Local consisted of 5 cc of 0.5% bupivacaine and 15 cc of Exparel long-acting anesthetic    Hemostasis: None    Specimen: Gangrenous left great toe    Complications: None    Operative indications:Tyler [name] is an 64 y.o. that presents today for surgical intervention.  The risks/benefits/alternatives/complications have been discussed and consent has been given.    Procedure:  Patient was brought into the OR and placed on the operating table in thesupine position. After anesthesia was obtained theleft lower extremity was prepped and draped in usual sterile fashion.  Attention was directed to the left great toe where 2 semielliptical incisions were made dorsal and plantar with a fishmouth type of flap created.  Full-thickness incision was taken down to the bone.  At the level of the base of the proximal phalanx a through and through osteotomy was created.  The toe was then removed from the surgical field.  Good healthy bleeding bone was noted.  Minimal bleeding was noted with the incision though.  The wound was flushed with copious amounts of irrigation.  The flap was then revised and reanastomosed with a combination of 3-0 Vicryl and 3-0 nylon.  Bulky sterile padded dressing was applied to the left foot.    Patient tolerated the procedure and anesthesia well.  Was transported from the OR to the PACU with all vital signs stable and vascular status intact. To be discharged per routine protocol.  Will follow up in approximately 1 week in the outpatient clinic.  A prescription for Percocet was sent to his pharmacy.

## 2019-08-04 NOTE — Transfer of Care (Signed)
Immediate Anesthesia Transfer of Care Note  Patient: Tyler [name]  Procedure(s) Performed: AMPUTATION TOE MPJ LEFT (Left Toe)  Patient Location: PACU  Anesthesia Type:General   Level of Consciousness: awake, alert  and oriented  Airway & Oxygen Therapy: Patient Spontanous Breathing and Patient connected to face mask oxygen  Post-op Assessment: Report given to RN and Post -op Vital signs reviewed and stable  Post vital signs: Reviewed and stable  Last Vitals:  Vitals Value Taken Time  BP 118/76 08/04/19 0819  Temp    Pulse 75 08/04/19 0820  Resp 18 08/04/19 0820  SpO2 100 % 08/04/19 0820  Vitals shown include unvalidated device data.  Last Pain:  Vitals:   08/04/19 0617  TempSrc: Temporal  PainSc: 8       Patients Stated Pain Goal: 3 ([id number])  Complications: No apparent anesthesia complications

## 2019-08-04 NOTE — Anesthesia Procedure Notes (Signed)
Performed by: Zyriah Mask, CRNA Pre-anesthesia Checklist: Patient identified, Emergency Drugs available, Suction available, Patient being monitored and Timeout performed Patient Re-evaluated:Patient Re-evaluated prior to induction Oxygen Delivery Method: Simple face mask Induction Type: IV induction       

## 2019-08-04 NOTE — Anesthesia Postprocedure Evaluation (Signed)
Anesthesia Post Note  Patient: Tyler [name]  Procedure(s) Performed: AMPUTATION TOE MPJ LEFT (Left Toe)  Patient location during evaluation: PACU Anesthesia Type: General Level of consciousness: awake and alert Pain management: pain level controlled Vital Signs Assessment: post-procedure vital signs reviewed and stable Respiratory status: spontaneous breathing, nonlabored ventilation, respiratory function stable and patient connected to nasal cannula oxygen Cardiovascular status: blood pressure returned to baseline and stable Postop Assessment: no apparent nausea or vomiting Anesthetic complications: no     Last Vitals:  Vitals:   08/04/19 0900 08/04/19 0924  BP: 118/63 (!) 106/59  Pulse: 69 79  Resp: 16 16  Temp: (!) 36.3 C   SpO2: 100% 98%    Last Pain:  Vitals:   08/04/19 0924  TempSrc:   PainSc: 0-No pain                 Arita Miss

## 2019-08-04 NOTE — Anesthesia Preprocedure Evaluation (Signed)
Anesthesia Evaluation  Patient identified by MRN, date of birth, ID band Patient awake    Reviewed: Allergy & Precautions, NPO status , Patient's Chart, lab work & pertinent test results  History of Anesthesia Complications Negative for: history of anesthetic complications  Airway Mallampati: III  TM Distance: >3 FB Neck ROM: Full    Dental  (+) Partial Lower, Poor Dentition   Pulmonary shortness of breath and at rest, neg sleep apnea, COPD,  oxygen dependent, Patient abstained from smoking.Not current smoker, former smoker,  On 2L [place] O2 at home   Pulmonary exam normal breath sounds clear to auscultation       Cardiovascular Exercise Tolerance: Poor METShypertension, + CAD, + Past MI, + Peripheral Vascular Disease and +CHF  + dysrhythmias Atrial Fibrillation + pacemaker + Cardiac Defibrillator III Rhythm:Irregular Rate:Normal - Systolic murmurs    Neuro/Psych PSYCHIATRIC DISORDERS Anxiety Depression negative neurological ROS  negative psych ROS   GI/Hepatic GERD  Controlled,(+)     (-) substance abuse  ,   Endo/Other  neg diabetes  Renal/GU negative Renal ROS     Musculoskeletal   Abdominal   Peds  Hematology   Anesthesia Other Findings Past Medical History: No date: AICD (automatic cardioverter/defibrillator) present No date: Anxiety No date: Arthritis No date: Atrial fibrillation (HCC) No date: Cervical spinal stenosis     Comment:  with neuropathy No date: CHF (congestive heart failure) (HCC) No date: COPD (chronic obstructive pulmonary disease) (HCC) No date: Coronary artery disease No date: Depression No date: Dyspnea No date: GERD (gastroesophageal reflux disease) No date: Hypertension No date: Myocardial infarction ([place]) No date: Peripheral vascular disease (HCC) No date: Presence of permanent cardiac pacemaker  Reproductive/Obstetrics                              Anesthesia Physical Anesthesia Plan  ASA: III  Anesthesia Plan: General   Post-op Pain Management:    Induction: Intravenous  PONV Risk Score and Plan: 2 and Ondansetron, Dexamethasone, Propofol infusion and TIVA  Airway Management Planned: Natural Airway  Additional Equipment: None  Intra-op Plan:   Post-operative Plan: Extubation in OR  Informed Consent: I have reviewed the patients History and Physical, chart, labs and discussed the procedure including the risks, benefits and alternatives for the proposed anesthesia with the patient or authorized representative who has indicated his/her understanding and acceptance.     Dental advisory given  Plan Discussed with: CRNA and Surgeon  Anesthesia Plan Comments: (Discussed risks of anesthesia with patient, including PONV, difficulty with spontaneous ventilation necessitating airway intervention and its associated risks such as sore throat, lip/dental damage. Rare risks discussed as well, such as cardiorespiratory and neurological sequelae. Patient understands. Patient counseled on being higher risk for anesthesia due to comorbidities: CHF. Patient was told about increased risk of cardiac and respiratory events, including death. Patient understands.  No need to intervene on ICD, patient does not appear pacer dependent per notes, and cautery will be far away, below the knee.)        Anesthesia Quick Evaluation

## 2019-08-04 NOTE — H&P (Signed)
HISTORY AND PHYSICAL INTERVAL NOTE:  08/04/2019  7:26 AM  Tyler [name]  has presented today for surgery, with the diagnosis of L97.524  ULCER LEFT FOOT I73.9 - PVD.  The various methods of treatment have been discussed with the patient.  No guarantees were given.  After consideration of risks, benefits and other options for treatment, the patient has consented to surgery.  I have reviewed the patients' chart and labs.     A history and physical examination was performed in my office.  The patient was reexamined.  There have been no changes to this history and physical examination.  Samara Deist A

## 2019-08-04 NOTE — Discharge Instructions (Addendum)
Tyler [name]           YOU ARE PARTIAL WEIGHT BEARING (some weight to your heel ok as MD advised you.) [place] DR. TROXLER, DR. Vickki Muff, AND DR. Grover   1. Take your medication as prescribed.  Pain medication should be taken only as needed.  2. Keep the dressing clean, dry and intact.  3. Keep your foot elevated above the heart level for the first 48 hours.  4. Walking to the bathroom and brief periods of walking are acceptable, unless we have instructed you to be non-weight bearing.  5. Always wear your post-op shoe when walking.  Always use your crutches if you are to be non-weight bearing.  6. Do not take a shower. Baths are permissible as long as the foot is kept out of the water.   7. Every hour you are awake:  - Bend your knee 15 times. - Flex foot 15 times - Massage calf 15 times  8. Call [facility] [phone]) if any of the following problems occur: - You develop a temperature or fever. - The bandage becomes saturated with blood. - Medication does not stop your pain. - Injury of the foot occurs. - Any symptoms of infection including redness, odor, or red streaks running from wound.  Restart the Coumadin tonight  AMBULATORY SURGERY  DISCHARGE INSTRUCTIONS   1) The drugs that you were given will stay in your system until tomorrow so for the next 24 hours you should not:  A) Drive an automobile B) Make any legal decisions C) Drink any alcoholic beverage   2) You may resume regular meals tomorrow.  Today it is better to start with liquids and gradually work up to solid foods.  You may eat anything you prefer, but it is better to start with liquids, then soup and crackers, and gradually work up to solid foods.   3) Please notify your doctor immediately if you have any unusual bleeding, trouble breathing, redness and pain at the surgery site, drainage,  fever, or pain not relieved by medication.    4) Additional Instructions:        Please contact your physician with any problems or Same Day Surgery at [phone], Monday through Friday 6 am to 4 pm, or [place] at [facility] number at [phone].

## 2019-08-08 ENCOUNTER — Ambulatory Visit (INDEPENDENT_AMBULATORY_CARE_PROVIDER_SITE_OTHER): Payer: Medicare HMO | Admitting: Vascular Surgery

## 2019-08-08 DIAGNOSIS — I5022 Chronic systolic (congestive) heart failure: Secondary | ICD-10-CM | POA: Diagnosis not present

## 2019-08-08 LAB — SURGICAL PATHOLOGY

## 2019-08-10 ENCOUNTER — Other Ambulatory Visit (INDEPENDENT_AMBULATORY_CARE_PROVIDER_SITE_OTHER): Payer: Medicare HMO

## 2019-08-10 ENCOUNTER — Encounter (INDEPENDENT_AMBULATORY_CARE_PROVIDER_SITE_OTHER): Payer: Self-pay | Admitting: Nurse Practitioner

## 2019-08-10 ENCOUNTER — Other Ambulatory Visit: Payer: Self-pay

## 2019-08-10 ENCOUNTER — Ambulatory Visit (INDEPENDENT_AMBULATORY_CARE_PROVIDER_SITE_OTHER): Payer: Medicare HMO | Admitting: Nurse Practitioner

## 2019-08-10 ENCOUNTER — Other Ambulatory Visit (INDEPENDENT_AMBULATORY_CARE_PROVIDER_SITE_OTHER): Payer: Self-pay | Admitting: Nurse Practitioner

## 2019-08-10 VITALS — BP 99/67 | HR 83 | Resp 16

## 2019-08-10 DIAGNOSIS — I7025 Atherosclerosis of native arteries of other extremities with ulceration: Secondary | ICD-10-CM | POA: Diagnosis not present

## 2019-08-10 DIAGNOSIS — R600 Localized edema: Secondary | ICD-10-CM

## 2019-08-10 DIAGNOSIS — M7989 Other specified soft tissue disorders: Secondary | ICD-10-CM

## 2019-08-10 DIAGNOSIS — I10 Essential (primary) hypertension: Secondary | ICD-10-CM | POA: Diagnosis not present

## 2019-08-10 DIAGNOSIS — I482 Chronic atrial fibrillation, unspecified: Secondary | ICD-10-CM | POA: Diagnosis not present

## 2019-08-15 ENCOUNTER — Encounter (INDEPENDENT_AMBULATORY_CARE_PROVIDER_SITE_OTHER): Payer: Self-pay | Admitting: Nurse Practitioner

## 2019-08-15 NOTE — Progress Notes (Signed)
SUBJECTIVE:  Patient ID: Tyler [name], male    DOB: May 24, 1956, 63 y.o.   MRN: [id number] Chief Complaint  Patient presents with  . Follow-up    unna check    HPI  Tyler [name] is a 64 y.o. male that presents today for evaluation of leg swelling following Unna wraps.  The patient had his wraps removed prior to his recent left toe amputation.  Since that time he has had some excessive swelling.  The patient does have systolic heart failure with a low ejection fraction which certainly contributes to his edema.  The patient also has peripheral vascular disease.  The heart failure also likely makes it difficult for him to elevate his lower extremities due to the pain in his toes from the microvascular disease.  He denies any fever, chills, nausea vomiting or shortness of breath.  He is concerned there is a possible blood clot as he has had multiple in the past.  Generally he is compliant with his anticoagulation however he did have to stop for several days prior to his surgery.  Today the patient has no evidence of DVT in his left lower extremity.  Past Medical History:  Diagnosis Date  . AICD (automatic cardioverter/defibrillator) present   . Anxiety   . Arthritis   . Atrial fibrillation ([place])   . Cervical spinal stenosis    with neuropathy  . CHF (congestive heart failure) ([place])   . COPD (chronic obstructive pulmonary disease) ([place])   . Coronary artery disease   . Depression   . Dyspnea   . GERD (gastroesophageal reflux disease)   . Hypertension   . Myocardial infarction ([place])   . Peripheral vascular disease ([place])   . Presence of permanent cardiac pacemaker     Past Surgical History:  Procedure Laterality Date  . ABOVE KNEE LEG AMPUTATION Right    after below the knee amputation   . AMPUTATION TOE Left 08/04/2019   Procedure: AMPUTATION TOE MPJ LEFT;  Surgeon: Samara Deist, DPM;  Location: [facility] ORS;  Service: Podiatry;  Laterality: Left;  . APPLICATION OF WOUND VAC  Left 01/12/2018   Procedure: APPLICATION OF WOUND VAC;  Surgeon: Algernon Huxley, MD;  Location: [facility] ORS;  Service: Vascular;  Laterality: Left;  . BELOW KNEE LEG AMPUTATION Right   . CATARACT EXTRACTION, BILATERAL Bilateral   . ENDARTERECTOMY FEMORAL Left 08/11/2017   Procedure: ENDARTERECTOMY FEMORAL;  Surgeon: Algernon Huxley, MD;  Location: [facility] ORS;  Service: Vascular;  Laterality: Left;  . HEMATOMA EVACUATION Left 01/12/2018   Procedure: EVACUATION HEMATOMA ( DRAINING OF SEROMA);  Surgeon: Algernon Huxley, MD;  Location: [facility] ORS;  Service: Vascular;  Laterality: Left;  . IMPLANTABLE CARDIOVERTER DEFIBRILLATOR (ICD) GENERATOR CHANGE Left 02/10/2017   Procedure: ICD GENERATOR CHANGE;  Surgeon: Isaias Cowman, MD;  Location: [facility] ORS;  Service: Cardiovascular;  Laterality: Left;  . INSERT / REPLACE / REMOVE PACEMAKER    . LOWER EXTREMITY ANGIOGRAPHY Left 06/07/2017   Procedure: LOWER EXTREMITY ANGIOGRAPHY;  Surgeon: Algernon Huxley, MD;  Location: [place] [facility];  Service: Cardiovascular;  Laterality: Left;  . LOWER EXTREMITY INTERVENTION  06/07/2017   Procedure: LOWER EXTREMITY INTERVENTION;  Surgeon: Algernon Huxley, MD;  Location: [place] [facility];  Service: Cardiovascular;;  . PERIPHERAL VASCULAR CATHETERIZATION Left 12/09/2015   Procedure: Lower Extremity Angiography;  Surgeon: Algernon Huxley, MD;  Location: [place] [facility];  Service: Cardiovascular;  Laterality: Left;  . PERIPHERAL VASCULAR CATHETERIZATION  12/09/2015  Procedure: Lower Extremity Intervention;  Surgeon: Algernon Huxley, MD;  Location: [place] [facility];  Service: Cardiovascular;;  . TONSILLECTOMY      Social History   Socioeconomic History  . Marital status: Married    Spouse name: Not on file  . Number of children: Not on file  . Years of education: Not on file  . Highest education level: Not on file  Occupational History  . Not on file  Tobacco Use  . Smoking status: Former Smoker    Packs/day: 1.00     Years: 42.00    Pack years: 42.00    Types: Cigarettes    Quit date: 09/23/2013    Years since quitting: 5.8  . Smokeless tobacco: Never Used  Substance and Sexual Activity  . Alcohol use: No  . Drug use: No  . Sexual activity: Not on file  Other Topics Concern  . Not on file  Social History Narrative  . Not on file   Social Determinants of Health   Financial Resource Strain:   . Difficulty of Paying Living Expenses:   Food Insecurity:   . Worried About Charity fundraiser in the Last Year:   . Arboriculturist in the Last Year:   Transportation Needs:   . Film/video editor (Medical):   Marland Kitchen Lack of Transportation (Non-Medical):   Physical Activity:   . Days of Exercise per Week:   . Minutes of Exercise per Session:   Stress:   . Feeling of Stress :   Social Connections:   . Frequency of Communication with Friends and Family:   . Frequency of Social Gatherings with Friends and Family:   . Attends Religious Services:   . Active Member of Clubs or Organizations:   . Attends Archivist Meetings:   Marland Kitchen Marital Status:   Intimate Partner Violence:   . Fear of Current or Ex-Partner:   . Emotionally Abused:   Marland Kitchen Physically Abused:   . Sexually Abused:     Family History  Problem Relation Age of Onset  . Cancer Mother   . Cancer Father   . Heart disease Father     Allergies  Allergen Reactions  . Apixaban Rash  . Rivaroxaban Rash     Review of Systems   Review of Systems: Negative Unless Checked Constitutional: [] Weight loss  [] Fever  [] Chills Cardiac: [] Chest pain   []  Atrial Fibrillation  [] Palpitations   [] Shortness of breath when laying flat   [] Shortness of breath with exertion. [] Shortness of breath at rest Vascular:  [] Pain in legs with walking   [] Pain in legs with standing [] Pain in legs when laying flat   [] Claudication    [] Pain in feet when laying flat    [] History of DVT   [] Phlebitis   [x] Swelling in legs   [] Varicose veins   [] Non-healing  ulcers Pulmonary:   [] Uses home oxygen   [] Productive cough   [] Hemoptysis   [] Wheeze  [x] COPD   [] Asthma Neurologic:  [] Dizziness   [] Seizures  [] Blackouts [] History of stroke   [] History of TIA  [] Aphasia   [] Temporary Blindness   [] Weakness or numbness in arm   [] Weakness or numbness in leg Musculoskeletal:   [] Joint swelling   [] Joint pain   [] Low back pain  []  History of Knee Replacement [] Arthritis [] back Surgeries  []  Spinal Stenosis    Hematologic:  [] Easy bruising  [] Easy bleeding   [x] Hypercoagulable state   [] Anemic Gastrointestinal:  [] Diarrhea   []   Vomiting  [] Gastroesophageal reflux/heartburn   [] Difficulty swallowing. [] Abdominal pain Genitourinary:  [] Chronic kidney disease   [] Difficult urination  [] Anuric   [] Blood in urine [] Frequent urination  [] Burning with urination   [] Hematuria Skin:  [] Rashes   [] Ulcers [x] Wounds Psychological:  [] History of anxiety   []  History of major depression  []  Memory Difficulties      OBJECTIVE:   Physical Exam  BP 99/67 (BP Location: Right Arm)   Pulse 83   Resp 16   Gen: WD/WN, NAD Head: [place]/AT, No temporalis wasting.  Ear/Nose/Throat: Hearing grossly intact, nares w/o erythema or drainage Eyes: PER, EOMI, sclera nonicteric.  Neck: Supple, no masses.  No JVD.  Pulmonary:  Good air movement, no use of accessory muscles.  Cardiac: RRR Vascular:  Unable to palpate pulses due to pedal edema.  3+ pitting edema left lower extremity  Gastrointestinal: soft, non-distended. No guarding/no peritoneal signs.  Musculoskeletal: M/S 5/5 throughout.    Right above-knee amputation Neurologic: Pain and light touch intact in extremities.  Symmetrical.  Speech is fluent. Motor exam as listed above. Psychiatric: Judgment intact, Mood & affect appropriate for pt's clinical situation. Dermatologic: No Venous rashes. No Ulcers Noted.  No changes consistent with cellulitis. Lymph : No Cervical lymphadenopathy, no lichenification or skin changes of chronic  lymphedema.       ASSESSMENT AND PLAN:  1. Leg swelling Today the patient continues to have significant leg swelling.  The patient's Unna boots were removed for his toe amputation.  Patient has a previous history of DVTs and was concerned that he may have a new DVT however noninvasive studies show that there is no DVT present.  The patient also maintains his anticoagulation.  The patient's issues with swelling are likely multifactorial in nature given his systolic heart failure as well as sitting dependent a good portion of the day.  This is also very difficult by the fact that the patient does have some microvascular disease making it difficult to elevate his lower extremities.  We will continue to place the patient in [place] wraps to be changed on a weekly basis to help patient with edema.  We will have the patient return to the office in 4 weeks for interim follow-up.  2. Atherosclerosis of native arteries of the extremities with ulceration [facility]) Patient recently underwent amputation of great toe on left lower extremity.  Wound looks well and will continue to be managed by podiatry.  We will maintain close follow-up to ensure adequate wound healing.  3. Essential hypertension, benign Blood pressure slightly low today however not symptomatic.  Patient will continue prescribed medications.   Current Outpatient Medications on File Prior to Visit  Medication Sig Dispense Refill  . acetaminophen (TYLENOL) 500 MG tablet Take 1,000 mg by mouth daily as needed for moderate pain or headache.    . ALPRAZolam (XANAX) 1 MG tablet Take 1 mg by mouth 2 (two) times daily as needed for anxiety or sleep.     Marland Kitchen aspirin EC 81 MG tablet Take 1 tablet (81 mg total) by mouth daily. (Patient taking differently: Take 81 mg by mouth daily after supper. ) 150 tablet 2  . calcium carbonate (TUMS - DOSED IN MG ELEMENTAL CALCIUM) 500 MG chewable tablet Chew 1-2 tablets by mouth daily as needed for indigestion or heartburn.     . citalopram (CELEXA) 10 MG tablet Take 10 mg by mouth daily.    Marland Kitchen docusate sodium (COLACE) 100 MG capsule Take 100 mg by mouth at bedtime  as needed (for constipation.).     Marland Kitchen ENTRESTO 24-26 MG Take 1 tablet by mouth 2 (two) times daily.    . fluticasone (FLONASE) 50 MCG/ACT nasal spray Place 2 sprays into both nostrils daily.    . fluticasone-salmeterol (ADVAIR HFA) 115-21 MCG/ACT inhaler USE 2 INHALATIONS ORALLY EVERY 12 HOURS (Patient taking differently: Inhale 2 puffs into the lungs every 12 (twelve) hours. ) 36 Inhaler 5  . furosemide (LASIX) 20 MG tablet Take 20 mg by mouth daily.     Marland Kitchen guaiFENesin (MUCINEX) 600 MG 12 hr tablet Take 600 mg by mouth every evening.     Marland Kitchen ipratropium (ATROVENT) 0.03 % nasal spray Place 2 sprays into both nostrils 2 (two) times daily.     . Ipratropium-Albuterol (COMBIVENT RESPIMAT) 20-100 MCG/ACT AERS respimat Inhale 1 puff into the lungs every 4 (four) hours.     Marland Kitchen levalbuterol (XOPENEX) 1.25 MG/3ML nebulizer solution Take 1.25 mg (3 mLs total) by nebulization every 4 (four) hours. (Patient taking differently: Take 3 mLs by nebulization every 4 (four) hours as needed for wheezing or shortness of breath. ) 72 mL 12  . loratadine (CLARITIN) 10 MG tablet Take 10 mg by mouth at bedtime.    . lovastatin (MEVACOR) 20 MG tablet Take 20 mg by mouth at bedtime.    . metoprolol succinate (TOPROL-XL) 25 MG 24 hr tablet Take 25 mg by mouth daily.    . nitroGLYCERIN (NITROSTAT) 0.4 MG SL tablet Place 0.4 mg under the tongue every 5 (five) minutes as needed for chest pain.    Marland Kitchen oxyCODONE-acetaminophen (PERCOCET) 5-325 MG tablet Take 1-2 tablets by mouth every 6 (six) hours as needed for severe pain. 20 tablet 0  . oxyCODONE-acetaminophen (PERCOCET/ROXICET) 5-325 MG tablet Take 1 tablet by mouth every 6 (six) hours as needed for severe pain.     . pantoprazole (PROTONIX) 40 MG tablet Take 40 mg by mouth daily before breakfast.     . Respiratory Therapy Supplies (FLUTTER)  DEVI Use as directed. 1 each 0  . SPIRIVA RESPIMAT 1.25 MCG/ACT AERS INHALE 2 PUFFS BY MOUTH INTO THE LUNGS DAILY (Patient taking differently: Inhale 2 puffs into the lungs daily. ) 4 g 6  . tamsulosin (FLOMAX) 0.4 MG CAPS capsule Take 0.4 mg by mouth daily after supper.   11  . vitamin B-12 (CYANOCOBALAMIN) 1000 MCG tablet Take 1,000 mcg by mouth daily.    Marland Kitchen warfarin (COUMADIN) 5 MG tablet Take 5 mg by mouth at bedtime.     . Wound Dressings (RESTORE WOUND CARE DRESSING) PADS Apply topically.     No current facility-administered medications on file prior to visit.    There are no Patient Instructions on file for this visit. No follow-ups on file.   Kris Hartmann, NP  This note was completed with Sales executive.  Any errors are purely unintentional.

## 2019-08-16 LAB — SLOW GROWER BROTH SUSCEP.
Amikacin: 1
Ciprofloxacin: 0.12
Clarithromycin: 0.12
Doxycycline: 0.5
Linezolid: 1
Moxifloxacin: 0.12
Rifabutin: 0.5
Rifampin: 0.12
Streptomycin: 1

## 2019-08-16 LAB — AFB ORGANISM ID BY DNA PROBE
M avium complex: NEGATIVE
M gordonae: NEGATIVE
M kansasii: NEGATIVE
M tuberculosis complex: NEGATIVE

## 2019-08-16 LAB — ACID FAST CULTURE WITH REFLEXED SENSITIVITIES (MYCOBACTERIA): Acid Fast Culture: POSITIVE — AB

## 2019-08-16 LAB — ORG ID BY SEQUENCING RFLX AST

## 2019-08-17 ENCOUNTER — Encounter (INDEPENDENT_AMBULATORY_CARE_PROVIDER_SITE_OTHER): Payer: Self-pay

## 2019-08-17 ENCOUNTER — Other Ambulatory Visit: Payer: Self-pay

## 2019-08-17 ENCOUNTER — Encounter (INDEPENDENT_AMBULATORY_CARE_PROVIDER_SITE_OTHER): Payer: Medicare HMO

## 2019-08-17 ENCOUNTER — Ambulatory Visit (INDEPENDENT_AMBULATORY_CARE_PROVIDER_SITE_OTHER): Payer: Medicare HMO | Admitting: Nurse Practitioner

## 2019-08-17 VITALS — BP 92/57 | HR 82 | Resp 16

## 2019-08-17 DIAGNOSIS — M7989 Other specified soft tissue disorders: Secondary | ICD-10-CM

## 2019-08-17 DIAGNOSIS — R600 Localized edema: Secondary | ICD-10-CM | POA: Diagnosis not present

## 2019-08-17 DIAGNOSIS — I739 Peripheral vascular disease, unspecified: Secondary | ICD-10-CM | POA: Diagnosis not present

## 2019-08-17 DIAGNOSIS — Z89611 Acquired absence of right leg above knee: Secondary | ICD-10-CM | POA: Diagnosis not present

## 2019-08-24 ENCOUNTER — Other Ambulatory Visit: Payer: Self-pay

## 2019-08-24 ENCOUNTER — Ambulatory Visit (INDEPENDENT_AMBULATORY_CARE_PROVIDER_SITE_OTHER): Payer: Medicare HMO | Admitting: Nurse Practitioner

## 2019-08-24 VITALS — BP 140/75 | HR 89 | Ht 73.0 in | Wt 165.0 lb

## 2019-08-24 DIAGNOSIS — R600 Localized edema: Secondary | ICD-10-CM

## 2019-08-24 DIAGNOSIS — M7989 Other specified soft tissue disorders: Secondary | ICD-10-CM

## 2019-08-24 DIAGNOSIS — J441 Chronic obstructive pulmonary disease with (acute) exacerbation: Secondary | ICD-10-CM | POA: Diagnosis not present

## 2019-08-24 NOTE — Progress Notes (Signed)
History of Present Illness  There is no documented history at this time  Assessments & Plan   There are no diagnoses linked to this encounter.    Additional instructions  Subjective:  Patient presents with venous ulcer of the Left lower extremity.    Procedure:  3 layer unna wrap was placed Left lower extremity.   Plan:   Follow up in one week.  

## 2019-08-28 ENCOUNTER — Encounter (INDEPENDENT_AMBULATORY_CARE_PROVIDER_SITE_OTHER): Payer: Self-pay | Admitting: Nurse Practitioner

## 2019-08-31 ENCOUNTER — Encounter (INDEPENDENT_AMBULATORY_CARE_PROVIDER_SITE_OTHER): Payer: Self-pay | Admitting: Nurse Practitioner

## 2019-08-31 ENCOUNTER — Encounter (INDEPENDENT_AMBULATORY_CARE_PROVIDER_SITE_OTHER): Payer: Medicare HMO

## 2019-09-07 ENCOUNTER — Ambulatory Visit (INDEPENDENT_AMBULATORY_CARE_PROVIDER_SITE_OTHER): Payer: Medicare HMO | Admitting: Nurse Practitioner

## 2019-09-07 ENCOUNTER — Encounter (INDEPENDENT_AMBULATORY_CARE_PROVIDER_SITE_OTHER): Payer: Self-pay | Admitting: Nurse Practitioner

## 2019-09-07 ENCOUNTER — Other Ambulatory Visit: Payer: Self-pay

## 2019-09-07 VITALS — BP 102/65 | HR 80 | Resp 16

## 2019-09-07 DIAGNOSIS — I10 Essential (primary) hypertension: Secondary | ICD-10-CM

## 2019-09-07 DIAGNOSIS — R600 Localized edema: Secondary | ICD-10-CM

## 2019-09-07 DIAGNOSIS — M7989 Other specified soft tissue disorders: Secondary | ICD-10-CM

## 2019-09-07 DIAGNOSIS — L97524 Non-pressure chronic ulcer of other part of left foot with necrosis of bone: Secondary | ICD-10-CM | POA: Diagnosis not present

## 2019-09-07 DIAGNOSIS — Z89611 Acquired absence of right leg above knee: Secondary | ICD-10-CM | POA: Diagnosis not present

## 2019-09-07 DIAGNOSIS — I739 Peripheral vascular disease, unspecified: Secondary | ICD-10-CM | POA: Diagnosis not present

## 2019-09-08 ENCOUNTER — Encounter (INDEPENDENT_AMBULATORY_CARE_PROVIDER_SITE_OTHER): Payer: Self-pay | Admitting: Nurse Practitioner

## 2019-09-08 NOTE — Progress Notes (Signed)
Subjective:    Patient ID: Tyler [name], male    DOB: Oct 22, 1955, 64 y.o.   MRN: [id number] Chief Complaint  Patient presents with  . Follow-up    unna boot follow up    Today patient returns for evaluation of lower extremity edema following the wraps.  The patient actually removed his own wraps about a week or so ago at home.  Today the patient's left lower extremity is swollen however it is less swollen than seen prior to his having the wraps placed.  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  The patient attempts to elevate his lower extremities as much as possible however it is somewhat difficult for continued elevation due to the pain he has in his toes.  Overall he has tolerated the Unna wraps well.   Review of Systems  Cardiovascular: Positive for leg swelling.  Skin: Positive for wound.  Neurological: Positive for weakness.  All other systems reviewed and are negative.      Objective:   Physical Exam Vitals reviewed. Exam conducted with a chaperone present (Wife present).  HENT:     Head: Normocephalic.  Pulmonary:     Effort: Pulmonary effort is normal.  Musculoskeletal:     Left lower leg: 2+ Pitting Edema present.     Right Lower Extremity: Right leg is amputated above knee.  Neurological:     Mental Status: He is alert and oriented to person, place, and time.  Psychiatric:        Mood and Affect: Mood normal.        Behavior: Behavior normal.        Thought Content: Thought content normal.        Judgment: Judgment normal.     BP 102/65 (BP Location: Right Arm)   Pulse 80   Resp 16   Past Medical History:  Diagnosis Date  . AICD (automatic cardioverter/defibrillator) present   . Anxiety   . Arthritis   . Atrial fibrillation ([place])   . Cervical spinal stenosis    with neuropathy  . CHF (congestive heart failure) ([place])   . COPD (chronic obstructive pulmonary disease) ([place])   . Coronary artery disease   . Depression   . Dyspnea    . GERD (gastroesophageal reflux disease)   . Hypertension   . Myocardial infarction ([place])   . Peripheral vascular disease ([place])   . Presence of permanent cardiac pacemaker     Social History   Socioeconomic History  . Marital status: Married    Spouse name: Not on file  . Number of children: Not on file  . Years of education: Not on file  . Highest education level: Not on file  Occupational History  . Not on file  Tobacco Use  . Smoking status: Former Smoker    Packs/day: 1.00    Years: 42.00    Pack years: 42.00    Types: Cigarettes    Quit date: 09/23/2013    Years since quitting: 5.9  . Smokeless tobacco: Never Used  Substance and Sexual Activity  . Alcohol use: No  . Drug use: No  . Sexual activity: Not on file  Other Topics Concern  . Not on file  Social History Narrative  . Not on file   Social Determinants of Health   Financial Resource Strain:   . Difficulty of Paying Living Expenses:   Food Insecurity:   . Worried About Charity fundraiser in the Last  Year:   . Ran Out of Food in the Last Year:   Transportation Needs:   . Film/video editor (Medical):   Marland Kitchen Lack of Transportation (Non-Medical):   Physical Activity:   . Days of Exercise per Week:   . Minutes of Exercise per Session:   Stress:   . Feeling of Stress :   Social Connections:   . Frequency of Communication with Friends and Family:   . Frequency of Social Gatherings with Friends and Family:   . Attends Religious Services:   . Active Member of Clubs or Organizations:   . Attends Archivist Meetings:   Marland Kitchen Marital Status:   Intimate Partner Violence:   . Fear of Current or Ex-Partner:   . Emotionally Abused:   Marland Kitchen Physically Abused:   . Sexually Abused:     Past Surgical History:  Procedure Laterality Date  . ABOVE KNEE LEG AMPUTATION Right    after below the knee amputation   . AMPUTATION TOE Left 08/04/2019   Procedure: AMPUTATION TOE MPJ LEFT;  Surgeon: Samara Deist,  DPM;  Location: [facility];  Service: Podiatry;  Laterality: Left;  . APPLICATION OF WOUND VAC Left 01/12/2018   Procedure: APPLICATION OF WOUND VAC;  Surgeon: Algernon Huxley, MD;  Location: [facility];  Service: Vascular;  Laterality: Left;  . BELOW KNEE LEG AMPUTATION Right   . CATARACT EXTRACTION, BILATERAL Bilateral   . ENDARTERECTOMY FEMORAL Left 08/11/2017   Procedure: ENDARTERECTOMY FEMORAL;  Surgeon: Algernon Huxley, MD;  Location: [facility];  Service: Vascular;  Laterality: Left;  . HEMATOMA EVACUATION Left 01/12/2018   Procedure: EVACUATION HEMATOMA ( DRAINING OF SEROMA);  Surgeon: Algernon Huxley, MD;  Location: [facility];  Service: Vascular;  Laterality: Left;  . IMPLANTABLE CARDIOVERTER DEFIBRILLATOR (ICD) GENERATOR CHANGE Left 02/10/2017   Procedure: ICD GENERATOR CHANGE;  Surgeon: Isaias Cowman, MD;  Location: [facility];  Service: Cardiovascular;  Laterality: Left;  . INSERT / REPLACE / REMOVE PACEMAKER    . LOWER EXTREMITY ANGIOGRAPHY Left 06/07/2017   Procedure: LOWER EXTREMITY ANGIOGRAPHY;  Surgeon: Algernon Huxley, MD;  Location: [facility];  Service: Cardiovascular;  Laterality: Left;  . LOWER EXTREMITY INTERVENTION  06/07/2017   Procedure: LOWER EXTREMITY INTERVENTION;  Surgeon: Algernon Huxley, MD;  Location: [facility];  Service: Cardiovascular;;  . PERIPHERAL VASCULAR CATHETERIZATION Left 12/09/2015   Procedure: Lower Extremity Angiography;  Surgeon: Algernon Huxley, MD;  Location: [facility];  Service: Cardiovascular;  Laterality: Left;  . PERIPHERAL VASCULAR CATHETERIZATION  12/09/2015   Procedure: Lower Extremity Intervention;  Surgeon: Algernon Huxley, MD;  Location: [facility];  Service: Cardiovascular;;  . TONSILLECTOMY      Family History  Problem Relation Age of Onset  . Cancer Mother   . Cancer Father   . Heart disease Father     Allergies  Allergen Reactions  . Apixaban Rash  . Rivaroxaban Rash       Assessment & Plan:   1. Leg  swelling Patient's leg swelling is likely multifactorial in cause.  The patient does have heart failure as well as he is in a dependent position for extended periods of time due to his amputation.  The patient was diligent about wearing compression stockings however after his recent toe amputation he is not able to wear compression stockings regularly like he would previously.  While his wound is healing we will plan on keeping the patient in [place] wraps.  Discussed with the patient and wife that because there are no open ulcerations if they want to remove the dressing in a day or so early before coming to the office that was no issue or if they even wanted to remove it several days prior to the last follow-up visit as stated previously that would be acceptable as well.  Otherwise patient is advised to elevate his lower extremity as much as he is tolerable for him.  Patient will continue to present to the office for wrap changes on a weekly basis and we will follow-up in 4 weeks for evaluation.  2. Essential hypertension, benign Blood pressure doing well today.  No medication changes.   Current Outpatient Medications on File Prior to Visit  Medication Sig Dispense Refill  . acetaminophen (TYLENOL) 500 MG tablet Take 1,000 mg by mouth daily as needed for moderate pain or headache.    . ALPRAZolam (XANAX) 1 MG tablet Take 1 mg by mouth 2 (two) times daily as needed for anxiety or sleep.     Marland Kitchen aspirin EC 81 MG tablet Take 1 tablet (81 mg total) by mouth daily. (Patient taking differently: Take 81 mg by mouth daily after supper. ) 150 tablet 2  . calcium carbonate (TUMS - DOSED IN MG ELEMENTAL CALCIUM) 500 MG chewable tablet Chew 1-2 tablets by mouth daily as needed for indigestion or heartburn.    . citalopram (CELEXA) 10 MG tablet Take 10 mg by mouth daily.    Marland Kitchen docusate sodium (COLACE) 100 MG capsule Take 100 mg by mouth at bedtime as needed (for constipation.).     Marland Kitchen ENTRESTO 24-26 MG Take 1 tablet by  mouth 2 (two) times daily.    . fluticasone (FLONASE) 50 MCG/ACT nasal spray Place 2 sprays into both nostrils daily.    . fluticasone-salmeterol (ADVAIR HFA) 115-21 MCG/ACT inhaler USE 2 INHALATIONS ORALLY EVERY 12 HOURS (Patient taking differently: Inhale 2 puffs into the lungs every 12 (twelve) hours. ) 36 Inhaler 5  . furosemide (LASIX) 20 MG tablet Take 20 mg by mouth daily.     Marland Kitchen guaiFENesin (MUCINEX) 600 MG 12 hr tablet Take 600 mg by mouth every evening.     Marland Kitchen ipratropium (ATROVENT) 0.03 % nasal spray Place 2 sprays into both nostrils 2 (two) times daily.     . Ipratropium-Albuterol (COMBIVENT RESPIMAT) 20-100 MCG/ACT AERS respimat Inhale 1 puff into the lungs every 4 (four) hours.     Marland Kitchen levalbuterol (XOPENEX) 1.25 MG/3ML nebulizer solution Take 1.25 mg (3 mLs total) by nebulization every 4 (four) hours. (Patient taking differently: Take 3 mLs by nebulization every 4 (four) hours as needed for wheezing or shortness of breath. ) 72 mL 12  . loratadine (CLARITIN) 10 MG tablet Take 10 mg by mouth at bedtime.    . lovastatin (MEVACOR) 20 MG tablet Take 20 mg by mouth at bedtime.    . metoprolol succinate (TOPROL-XL) 25 MG 24 hr tablet Take 25 mg by mouth daily.    . nitroGLYCERIN (NITROSTAT) 0.4 MG SL tablet Place 0.4 mg under the tongue every 5 (five) minutes as needed for chest pain.    Marland Kitchen oxyCODONE-acetaminophen (PERCOCET) 5-325 MG tablet Take 1-2 tablets by mouth every 6 (six) hours as needed for severe pain. 20 tablet 0  . oxyCODONE-acetaminophen (PERCOCET/ROXICET) 5-325 MG tablet Take 1 tablet by mouth every 6 (six) hours as needed for severe pain.     . pantoprazole (PROTONIX) 40 MG tablet Take 40 mg by mouth daily  before breakfast.     . Respiratory Therapy Supplies (FLUTTER) DEVI Use as directed. 1 each 0  . SPIRIVA RESPIMAT 1.25 MCG/ACT AERS INHALE 2 PUFFS BY MOUTH INTO THE LUNGS DAILY (Patient taking differently: Inhale 2 puffs into the lungs daily. ) 4 g 6  . tamsulosin (FLOMAX) 0.4  MG CAPS capsule Take 0.4 mg by mouth daily after supper.   11  . vitamin B-12 (CYANOCOBALAMIN) 1000 MCG tablet Take 1,000 mcg by mouth daily.    Marland Kitchen warfarin (COUMADIN) 5 MG tablet Take 5 mg by mouth at bedtime.     . Wound Dressings (RESTORE WOUND CARE DRESSING) PADS Apply topically.     No current facility-administered medications on file prior to visit.    There are no Patient Instructions on file for this visit. No follow-ups on file.   Kris Hartmann, NP

## 2019-09-13 ENCOUNTER — Ambulatory Visit (INDEPENDENT_AMBULATORY_CARE_PROVIDER_SITE_OTHER): Payer: Medicare HMO | Admitting: Nurse Practitioner

## 2019-09-13 ENCOUNTER — Other Ambulatory Visit: Payer: Self-pay

## 2019-09-13 ENCOUNTER — Encounter (INDEPENDENT_AMBULATORY_CARE_PROVIDER_SITE_OTHER): Payer: Self-pay

## 2019-09-13 VITALS — BP 125/71 | HR 80 | Resp 16

## 2019-09-13 DIAGNOSIS — I482 Chronic atrial fibrillation, unspecified: Secondary | ICD-10-CM | POA: Diagnosis not present

## 2019-09-13 DIAGNOSIS — M7989 Other specified soft tissue disorders: Secondary | ICD-10-CM

## 2019-09-13 DIAGNOSIS — E782 Mixed hyperlipidemia: Secondary | ICD-10-CM | POA: Diagnosis not present

## 2019-09-13 DIAGNOSIS — R600 Localized edema: Secondary | ICD-10-CM

## 2019-09-13 DIAGNOSIS — I739 Peripheral vascular disease, unspecified: Secondary | ICD-10-CM | POA: Diagnosis not present

## 2019-09-13 NOTE — Progress Notes (Signed)
History of Present Illness  There is no documented history at this time  Assessments & Plan   There are no diagnoses linked to this encounter.    Additional instructions  Subjective:  Patient presents with venous ulcer of the Left lower extremity.    Procedure:  3 layer unna wrap was placed Left lower extremity.   Plan:   Follow up in one week.  

## 2019-09-14 ENCOUNTER — Encounter (INDEPENDENT_AMBULATORY_CARE_PROVIDER_SITE_OTHER): Payer: Self-pay | Admitting: Nurse Practitioner

## 2019-09-14 ENCOUNTER — Encounter (INDEPENDENT_AMBULATORY_CARE_PROVIDER_SITE_OTHER): Payer: Medicare HMO

## 2019-09-20 DIAGNOSIS — Z87891 Personal history of nicotine dependence: Secondary | ICD-10-CM | POA: Diagnosis not present

## 2019-09-20 DIAGNOSIS — J9610 Chronic respiratory failure, unspecified whether with hypoxia or hypercapnia: Secondary | ICD-10-CM | POA: Diagnosis not present

## 2019-09-20 DIAGNOSIS — I739 Peripheral vascular disease, unspecified: Secondary | ICD-10-CM | POA: Diagnosis not present

## 2019-09-20 DIAGNOSIS — I482 Chronic atrial fibrillation, unspecified: Secondary | ICD-10-CM | POA: Diagnosis not present

## 2019-09-20 DIAGNOSIS — Z89611 Acquired absence of right leg above knee: Secondary | ICD-10-CM | POA: Diagnosis not present

## 2019-09-20 DIAGNOSIS — D6851 Activated protein C resistance: Secondary | ICD-10-CM | POA: Diagnosis not present

## 2019-09-20 DIAGNOSIS — J431 Panlobular emphysema: Secondary | ICD-10-CM | POA: Diagnosis not present

## 2019-09-20 DIAGNOSIS — G629 Polyneuropathy, unspecified: Secondary | ICD-10-CM | POA: Diagnosis not present

## 2019-09-20 DIAGNOSIS — M5000 Cervical disc disorder with myelopathy, unspecified cervical region: Secondary | ICD-10-CM | POA: Diagnosis not present

## 2019-09-21 ENCOUNTER — Encounter (INDEPENDENT_AMBULATORY_CARE_PROVIDER_SITE_OTHER): Payer: Self-pay

## 2019-09-21 ENCOUNTER — Ambulatory Visit (INDEPENDENT_AMBULATORY_CARE_PROVIDER_SITE_OTHER): Payer: Medicare HMO | Admitting: Nurse Practitioner

## 2019-09-21 ENCOUNTER — Other Ambulatory Visit: Payer: Self-pay

## 2019-09-21 VITALS — BP 93/58 | HR 78 | Resp 16

## 2019-09-21 DIAGNOSIS — R600 Localized edema: Secondary | ICD-10-CM

## 2019-09-21 DIAGNOSIS — M7989 Other specified soft tissue disorders: Secondary | ICD-10-CM

## 2019-09-21 NOTE — Progress Notes (Signed)
History of Present Illness  There is no documented history at this time  Assessments & Plan   There are no diagnoses linked to this encounter.    Additional instructions  Subjective:  Patient presents with venous ulcer of the Left lower extremity.    Procedure:  3 layer unna wrap was placed Left lower extremity.   Plan:   Follow up in one week.  

## 2019-09-23 DIAGNOSIS — I70299 Other atherosclerosis of native arteries of extremities, unspecified extremity: Secondary | ICD-10-CM

## 2019-09-23 DIAGNOSIS — J441 Chronic obstructive pulmonary disease with (acute) exacerbation: Secondary | ICD-10-CM | POA: Diagnosis not present

## 2019-09-23 DIAGNOSIS — L97909 Non-pressure chronic ulcer of unspecified part of unspecified lower leg with unspecified severity: Secondary | ICD-10-CM

## 2019-09-23 HISTORY — DX: Non-pressure chronic ulcer of unspecified part of unspecified lower leg with unspecified severity: L97.909

## 2019-09-23 HISTORY — DX: Other atherosclerosis of native arteries of extremities, unspecified extremity: I70.299

## 2019-09-24 ENCOUNTER — Encounter (INDEPENDENT_AMBULATORY_CARE_PROVIDER_SITE_OTHER): Payer: Self-pay | Admitting: Nurse Practitioner

## 2019-09-28 ENCOUNTER — Telehealth (INDEPENDENT_AMBULATORY_CARE_PROVIDER_SITE_OTHER): Payer: Self-pay | Admitting: Nurse Practitioner

## 2019-09-28 ENCOUNTER — Encounter (INDEPENDENT_AMBULATORY_CARE_PROVIDER_SITE_OTHER): Payer: Self-pay | Admitting: Nurse Practitioner

## 2019-09-28 ENCOUNTER — Ambulatory Visit (INDEPENDENT_AMBULATORY_CARE_PROVIDER_SITE_OTHER): Payer: Medicare HMO | Admitting: Nurse Practitioner

## 2019-09-28 ENCOUNTER — Other Ambulatory Visit: Payer: Self-pay

## 2019-09-28 VITALS — BP 96/62 | HR 82 | Resp 16

## 2019-09-28 DIAGNOSIS — R600 Localized edema: Secondary | ICD-10-CM | POA: Diagnosis not present

## 2019-09-28 DIAGNOSIS — L97524 Non-pressure chronic ulcer of other part of left foot with necrosis of bone: Secondary | ICD-10-CM | POA: Diagnosis not present

## 2019-09-28 DIAGNOSIS — I739 Peripheral vascular disease, unspecified: Secondary | ICD-10-CM | POA: Diagnosis not present

## 2019-09-28 DIAGNOSIS — I96 Gangrene, not elsewhere classified: Secondary | ICD-10-CM | POA: Diagnosis not present

## 2019-09-28 DIAGNOSIS — M7989 Other specified soft tissue disorders: Secondary | ICD-10-CM

## 2019-09-28 NOTE — Progress Notes (Signed)
History of Present Illness  There is no documented history at this time  Assessments & Plan   There are no diagnoses linked to this encounter.    Additional instructions  Subjective:  Patient presents with venous ulcer of the Left lower extremity.    Procedure:  3 layer unna wrap was placed Left lower extremity.   Plan:   Follow up in one week.  

## 2019-09-28 NOTE — Telephone Encounter (Signed)
I contacted the patient on his mobile device shortly after he left our office for his Unna wraps.  His podiatrist Dr. Vickki Muff reached out with concern for his lower extremity healing.  The patient was identified with 2 unique identifiers.  Based on the patient's most recent visit with podiatry his wounds are not healing very well in addition to the development of new ulcerations on his adjacent toes.  Previously prior to this amputation the patient had evidence that his circulation would be marginal for wound healing.  Also complicating this is the patient's reduced ejection fraction.  I discussed undergoing an angiogram with the patient.  I discussed the risk, benefits and alternatives of the procedure as well as possible expected outcomes.  We will plan on performing the procedure prior to his upcoming amputation on 10/06/2019.  Patient agrees to undergo the left lower extremity angiogram.  Patient will follow up in office with [place] following his angiogram.

## 2019-09-29 ENCOUNTER — Other Ambulatory Visit: Payer: Self-pay | Admitting: Podiatry

## 2019-10-02 ENCOUNTER — Telehealth (INDEPENDENT_AMBULATORY_CARE_PROVIDER_SITE_OTHER): Payer: Self-pay

## 2019-10-02 ENCOUNTER — Encounter
Admission: RE | Admit: 2019-10-02 | Discharge: 2019-10-02 | Disposition: A | Discharge status: Home / Self Care | Payer: Medicare HMO | Source: Ambulatory Visit | Attending: Podiatry | Admitting: Podiatry

## 2019-10-02 ENCOUNTER — Other Ambulatory Visit: Payer: Self-pay

## 2019-10-02 DIAGNOSIS — Z01818 Encounter for other preprocedural examination: Secondary | ICD-10-CM | POA: Insufficient documentation

## 2019-10-02 HISTORY — DX: Constipation, unspecified: K59.00

## 2019-10-02 HISTORY — DX: Emphysema, unspecified: J43.9

## 2019-10-02 HISTORY — DX: Cardiac arrhythmia, unspecified: I49.9

## 2019-10-02 HISTORY — DX: Solitary pulmonary nodule: R91.1

## 2019-10-02 HISTORY — DX: Dependence on supplemental oxygen: Z99.81

## 2019-10-02 NOTE — Patient Instructions (Addendum)
INSTRUCTIONS FOR SURGERY     Your surgery is scheduled for:   Friday, MAY 14TH     To find out your arrival time for the day of surgery,          please call [phone] between 1 pm and 3 pm on :  Thursday, MAY [place] , Wednesday, MAY 12TH     When you arrive for surgery, report to the [facility].       Do NOT stop on the first floor to register.    REMEMBER: Instructions that are not followed completely may result in serious medical risk,  up to and including death, or upon the discretion of your surgeon and anesthesiologist,            your surgery may need to be rescheduled.  __X__ 1. Do not eat food after midnight the night before your procedure.                    No gum, candy, lozenger, tic tacs, tums or hard candies.                  ABSOLUTELY NOTHING SOLID IN YOUR MOUTH AFTER MIDNIGHT                    You may drink unlimited clear liquids up to 2 hours before you are scheduled to arrive for surgery.                   Do not drink anything within those 2 hours unless you need to take medicine, then take the                   smallest amount you need.  Clear liquids include:  water, apple juice without pulp,                   any flavor Gatorade, Black coffee, black tea.  Sugar may be added but no dairy/ honey /lemon.                        Broth and jello is not considered a clear liquid.  __x__  2. On the morning of surgery, please brush your teeth with toothpaste and water. You may rinse with                  mouthwash if you wish but DO NOT SWALLOW TOOTHPASTE OR MOUTHWASH  __X___3. NO alcohol for 24 hours before or after surgery.  __x___ 4.  Do NOT smoke or use e-cigarettes for 24 HOURS PRIOR TO SURGERY.                      DO NOT Use any chewable tobacco products for at least 6 hours prior to surgery.  __x___ 5. If you start any new medication after this appointment and  prior to surgery, please                   Bring it with you on the day of surgery.  ___x__ 6.  Notify your doctor if there is any change in your medical condition, such as fever,                  infection, vomitting, diarrhea or any open sores.  __x___ 7.  USE the CHG WIPES as instructed, the night before surgery and the day of surgery.                   Once you have washed with this soap, do NOT use any of the following: Powders, perfumes                    or lotions. Please do not wear make up, hairpins, clips or nail polish. You MAY wear deodorant.                   Men may shave their face and neck.  Women need to shave 48 hours prior to surgery.                   DO NOT wear ANY jewelry on the day of surgery. If there are rings that are too tight to                    remove easily, please address this prior to the surgery day. Piercings need to be removed.                                                                     NO METAL ON YOUR BODY.                    Do NOT bring any valuables.  If you came to Pre-Admit testing then you will not need license,                     insurance card or credit card.  If you will be staying overnight, please either leave your things in                     the car or have your family be responsible for these items.                     [facility] IS NOT RESPONSIBLE FOR BELONGINGS OR VALUABLES.  ___X__ 8. DO NOT wear contact lenses on surgery day.  You may not have dentures,                     Hearing aides, contacts or glasses in the operating room. These items can be                    Placed in the Recovery Room to receive immediately after surgery.  __x___ 9. IF YOU ARE SCHEDULED TO GO HOME ON THE SAME DAY, YOU MUST                   Have someone to drive you home and to stay with you  for the first 24 hours.                    Have an arrangement prior to arriving on surgery day.  ___x__ 10. Take the following medications on the morning  of surgery with a sip of water:                              1. XANAX                     2. METOPROLOL                     3. PROTONIX                     4. NEURONTIN                     5. ADVAIR INHALER                     6. ATROVENT SPRAY                     7. FLONASE                     8. COMBIVENT INHALER                     9. PERCOCET                    10. XOPENEX INHALER  __X___ 11.  Follow any instructions provided to you by your surgeon.                        Such as enema, clear liquid bowel prep                      ##COMPLETE THE PRESURGICAL DRINK ON THE MORNING OF                        SURGERY. HAVE IT COMPLETED 2 HOURS PRIOR TO ARRIVAL                        TO [facility].##  __X__  12. STOP WARFARIN AS INSTRUCTED.  CONTINUE TAKING ASPIRIN,                      BUT DO NOT TAKE ON DAY OF SURGERY     __x___ 13. STOP Anti-inflammatories as of:   TODAY, MAY 10TH                                     This includes IBUPROFEN / MOTRIN / ADVIL / ALEVE/ NAPROXYN                    YOU MAY TAKE TYLENOL ANY TIME PRIOR TO SURGERY.  __X___ 13.  You may continue taking Vitamin B12 / Vitamin D3 but do not take on the morning of surgery.  ___X___17.  Continue to take the following medications but do not take on the morning of surgery:                         LASIX // ENTRESTO // COLACE // VITAMIN B12 // ASPIRIN  __X____18. If staying overnight, please have appropriate shoes to wear to be able to walk around  the unit.                   Wear clean and comfortable clothing to the hospital.  CONTINUE TAKING ALL YOUR EVENING MEDICATIONS AS USUAL.  NO NEED TO STOP ANY OF    THESE AHEAD OF TIME.  CONSIDER BRINGING A PHONE CHARGER IN CASE YOU NEED TO STAY OVERNIGHT.

## 2019-10-02 NOTE — Telephone Encounter (Signed)
Spoke with the patient and he is now schedule with Dr. Lucky Cowboy for a left leg angio on 10/05/19 with a 2:00 pm arrival time to the [facility]. Patient is scheduled for surgery on Friday with Dr. Vickki Muff and will do covid testing on 10/05/19 for his angio and surgery at the [facility]. Pre-procedure instructions were discussed and will be mailed to the patient.

## 2019-10-02 NOTE — Pre-Procedure Instructions (Signed)
ekg performed in Dr. Nehemiah Massed office.  ECG Results - documented in this encounter  ECG 12-lead (07/26/2019 3:00 PM EST) ECG 12-lead (07/26/2019 3:00 PM EST)  Component Value Ref Range Performed At Pathologist Signature  Vent Rate (bpm) 83  [facility] GE MUSE RESULTS   QRS Interval (msec) 188  [facility] GE MUSE RESULTS   QT Interval (msec) 460  [facility] GE MUSE RESULTS   QTc (msec) 540  [facility] GE MUSE RESULTS    ECG 12-lead (07/26/2019 3:00 PM EST)  Specimen     ECG 12-lead (07/26/2019 3:00 PM EST)  Narrative Performed At  This result has an attachment that is not available.  Ventricular-paced rhythm Abnormal ECG When compared with ECG of 13-Mar-2019 11:30, Vent. rate has increased BY  3 BPM I reviewed and concur with this report. Electronically signed [id number] MD, Darnell Level [phone]) on 08/01/2019 12:51:59 PM

## 2019-10-04 ENCOUNTER — Other Ambulatory Visit
Admission: RE | Admit: 2019-10-04 | Discharge: 2019-10-04 | Disposition: A | Discharge status: Home / Self Care | Payer: Medicare HMO | Source: Ambulatory Visit | Attending: Podiatry | Admitting: Podiatry

## 2019-10-04 ENCOUNTER — Other Ambulatory Visit (INDEPENDENT_AMBULATORY_CARE_PROVIDER_SITE_OTHER): Payer: Self-pay | Admitting: Nurse Practitioner

## 2019-10-04 ENCOUNTER — Other Ambulatory Visit: Payer: Self-pay

## 2019-10-04 DIAGNOSIS — Z01812 Encounter for preprocedural laboratory examination: Secondary | ICD-10-CM | POA: Diagnosis not present

## 2019-10-04 DIAGNOSIS — Z20822 Contact with and (suspected) exposure to covid-19: Secondary | ICD-10-CM | POA: Diagnosis not present

## 2019-10-04 DIAGNOSIS — J441 Chronic obstructive pulmonary disease with (acute) exacerbation: Secondary | ICD-10-CM | POA: Diagnosis not present

## 2019-10-04 LAB — SARS CORONAVIRUS 2 (TAT 6-24 HRS): SARS Coronavirus 2: NEGATIVE

## 2019-10-04 NOTE — Progress Notes (Signed)
Patient contacted regarding new arrival time of 1:00pm tomorrow for lower extremity angiogram.  Patient in agreement

## 2019-10-05 ENCOUNTER — Other Ambulatory Visit: Payer: Self-pay

## 2019-10-05 ENCOUNTER — Ambulatory Visit
Admission: RE | Admit: 2019-10-05 | Discharge: 2019-10-05 | Disposition: A | Discharge status: Home / Self Care | Payer: Medicare HMO | Attending: Vascular Surgery | Admitting: Vascular Surgery

## 2019-10-05 ENCOUNTER — Encounter
Admission: RE | Disposition: A | Discharge status: Home / Self Care | Payer: Self-pay | Source: Home / Self Care | Attending: Vascular Surgery

## 2019-10-05 ENCOUNTER — Ambulatory Visit (INDEPENDENT_AMBULATORY_CARE_PROVIDER_SITE_OTHER): Payer: Medicare HMO | Admitting: Nurse Practitioner

## 2019-10-05 DIAGNOSIS — K219 Gastro-esophageal reflux disease without esophagitis: Secondary | ICD-10-CM | POA: Insufficient documentation

## 2019-10-05 DIAGNOSIS — I70249 Atherosclerosis of native arteries of left leg with ulceration of unspecified site: Secondary | ICD-10-CM | POA: Diagnosis not present

## 2019-10-05 DIAGNOSIS — Z7901 Long term (current) use of anticoagulants: Secondary | ICD-10-CM | POA: Diagnosis not present

## 2019-10-05 DIAGNOSIS — I70245 Atherosclerosis of native arteries of left leg with ulceration of other part of foot: Secondary | ICD-10-CM | POA: Insufficient documentation

## 2019-10-05 DIAGNOSIS — Z7982 Long term (current) use of aspirin: Secondary | ICD-10-CM | POA: Insufficient documentation

## 2019-10-05 DIAGNOSIS — Z87891 Personal history of nicotine dependence: Secondary | ICD-10-CM | POA: Insufficient documentation

## 2019-10-05 DIAGNOSIS — R609 Edema, unspecified: Secondary | ICD-10-CM | POA: Diagnosis not present

## 2019-10-05 DIAGNOSIS — Z79899 Other long term (current) drug therapy: Secondary | ICD-10-CM | POA: Insufficient documentation

## 2019-10-05 DIAGNOSIS — F329 Major depressive disorder, single episode, unspecified: Secondary | ICD-10-CM | POA: Diagnosis not present

## 2019-10-05 DIAGNOSIS — I4891 Unspecified atrial fibrillation: Secondary | ICD-10-CM | POA: Diagnosis not present

## 2019-10-05 DIAGNOSIS — F419 Anxiety disorder, unspecified: Secondary | ICD-10-CM | POA: Insufficient documentation

## 2019-10-05 DIAGNOSIS — Z7951 Long term (current) use of inhaled steroids: Secondary | ICD-10-CM | POA: Insufficient documentation

## 2019-10-05 DIAGNOSIS — J449 Chronic obstructive pulmonary disease, unspecified: Secondary | ICD-10-CM | POA: Diagnosis not present

## 2019-10-05 DIAGNOSIS — I252 Old myocardial infarction: Secondary | ICD-10-CM | POA: Insufficient documentation

## 2019-10-05 DIAGNOSIS — I110 Hypertensive heart disease with heart failure: Secondary | ICD-10-CM | POA: Insufficient documentation

## 2019-10-05 DIAGNOSIS — L97529 Non-pressure chronic ulcer of other part of left foot with unspecified severity: Secondary | ICD-10-CM | POA: Insufficient documentation

## 2019-10-05 DIAGNOSIS — Z950 Presence of cardiac pacemaker: Secondary | ICD-10-CM | POA: Diagnosis not present

## 2019-10-05 DIAGNOSIS — L97909 Non-pressure chronic ulcer of unspecified part of unspecified lower leg with unspecified severity: Secondary | ICD-10-CM

## 2019-10-05 DIAGNOSIS — Z89611 Acquired absence of right leg above knee: Secondary | ICD-10-CM | POA: Insufficient documentation

## 2019-10-05 DIAGNOSIS — I2510 Atherosclerotic heart disease of native coronary artery without angina pectoris: Secondary | ICD-10-CM | POA: Insufficient documentation

## 2019-10-05 DIAGNOSIS — I70299 Other atherosclerosis of native arteries of extremities, unspecified extremity: Secondary | ICD-10-CM

## 2019-10-05 DIAGNOSIS — I509 Heart failure, unspecified: Secondary | ICD-10-CM | POA: Insufficient documentation

## 2019-10-05 HISTORY — PX: LOWER EXTREMITY ANGIOGRAPHY: CATH118251

## 2019-10-05 LAB — PROTIME-INR
INR: 1.1 (ref 0.8–1.2)
Prothrombin Time: 13.6 seconds (ref 11.4–15.2)

## 2019-10-05 LAB — BUN: BUN: 9 mg/dL (ref 8–23)

## 2019-10-05 LAB — CREATININE, SERUM
Creatinine, Ser: 0.67 mg/dL (ref 0.61–1.24)
GFR calc Af Amer: >60 mL/min (ref >60)
GFR calc non Af Amer: >60 mL/min (ref >60)

## 2019-10-05 SURGERY — LOWER EXTREMITY ANGIOGRAPHY
Anesthesia: Moderate Sedation | Laterality: Left

## 2019-10-05 MED ORDER — DIPHENHYDRAMINE HCL 50 MG/ML IJ SOLN: 50.0000 mg | Freq: Once | INTRAMUSCULAR | Status: DC | PRN

## 2019-10-05 MED ORDER — MIDAZOLAM HCL 5 MG/5ML IJ SOLN
INTRAMUSCULAR | Status: AC
  Filled 2019-10-05: qty 5

## 2019-10-05 MED ORDER — CEFAZOLIN SODIUM-DEXTROSE 2-4 GM/100ML-% IV SOLN
INTRAVENOUS | Status: AC
  Administered 2019-10-05: 2 g via INTRAVENOUS
  Filled 2019-10-05: qty 100

## 2019-10-05 MED ORDER — METHYLPREDNISOLONE SODIUM SUCC 125 MG IJ SOLR: 125.0000 mg | Freq: Once | INTRAMUSCULAR | Status: DC | PRN

## 2019-10-05 MED ORDER — MIDAZOLAM HCL 2 MG/ML PO SYRP: 8.0000 mg | ORAL_SOLUTION | Freq: Once | ORAL | Status: DC | PRN

## 2019-10-05 MED ORDER — IODIXANOL 320 MG/ML IV SOLN
INTRAVENOUS | Status: DC | PRN
  Administered 2019-10-05: 40 mL

## 2019-10-05 MED ORDER — HEPARIN SODIUM (PORCINE) 1000 UNIT/ML IJ SOLN
INTRAMUSCULAR | Status: AC
  Filled 2019-10-05: qty 1

## 2019-10-05 MED ORDER — CEFAZOLIN SODIUM-DEXTROSE 2-4 GM/100ML-% IV SOLN: 2.0000 g | Freq: Once | INTRAVENOUS | Status: AC

## 2019-10-05 MED ORDER — HEPARIN SODIUM (PORCINE) 1000 UNIT/ML IJ SOLN
INTRAMUSCULAR | Status: DC | PRN
  Administered 2019-10-05: 3500 [IU] via INTRAVENOUS

## 2019-10-05 MED ORDER — SODIUM CHLORIDE 0.9 % IV SOLN: INTRAVENOUS | Status: DC

## 2019-10-05 MED ORDER — FENTANYL CITRATE (PF) 100 MCG/2ML IJ SOLN
INTRAMUSCULAR | Status: DC | PRN
  Administered 2019-10-05: 25 ug via INTRAVENOUS
  Administered 2019-10-05: 50 ug via INTRAVENOUS
  Administered 2019-10-05: 25 ug via INTRAVENOUS

## 2019-10-05 MED ORDER — FENTANYL CITRATE (PF) 100 MCG/2ML IJ SOLN
INTRAMUSCULAR | Status: AC
  Filled 2019-10-05: qty 2

## 2019-10-05 MED ORDER — ONDANSETRON HCL 4 MG/2ML IJ SOLN
INTRAMUSCULAR | Status: AC
  Filled 2019-10-05: qty 2

## 2019-10-05 MED ORDER — FAMOTIDINE 20 MG PO TABS: 40.0000 mg | ORAL_TABLET | Freq: Once | ORAL | Status: DC | PRN

## 2019-10-05 MED ORDER — HYDROMORPHONE HCL 1 MG/ML IJ SOLN
INTRAMUSCULAR | Status: AC
  Administered 2019-10-05: 1 mg via INTRAVENOUS
  Filled 2019-10-05: qty 1

## 2019-10-05 MED ORDER — HYDROMORPHONE HCL 1 MG/ML IJ SOLN: 1.0000 mg | Freq: Once | INTRAMUSCULAR | Status: AC | PRN

## 2019-10-05 MED ORDER — MIDAZOLAM HCL 2 MG/2ML IJ SOLN
INTRAMUSCULAR | Status: DC | PRN
  Administered 2019-10-05 (×2): 1 mg via INTRAVENOUS
  Administered 2019-10-05: 2 mg via INTRAVENOUS
  Administered 2019-10-05: 1 mg via INTRAVENOUS

## 2019-10-05 MED ORDER — ONDANSETRON HCL 4 MG/2ML IJ SOLN
4.0000 mg | Freq: Four times a day (QID) | INTRAMUSCULAR | Status: DC | PRN
  Administered 2019-10-05: 4 mg via INTRAVENOUS

## 2019-10-05 SURGICAL SUPPLY — 13 items
BALLN LUTONIX 018 5X150X130 (BALLOONS) ×3
BALLN ULTRV 018 3X100X75 (BALLOONS) ×3
BALLOON LUTONIX 018 5X150X130 (BALLOONS) IMPLANT
BALLOON ULTRV 018 3X100X75 (BALLOONS) IMPLANT
CANNULA 5F STIFF (CANNULA) ×4 IMPLANT
CATH BEACON 5 .035 65 KMP TIP (CATHETERS) ×2 IMPLANT
COVER PROBE U/S 5X48 (MISCELLANEOUS) ×2 IMPLANT
DEVICE PRESTO INFLATION (MISCELLANEOUS) ×2 IMPLANT
GLIDEWIRE ADV .035X180CM (WIRE) ×2 IMPLANT
PACK ANGIOGRAPHY (CUSTOM PROCEDURE TRAY) ×2 IMPLANT
SHEATH BRITE TIP 5FRX11 (SHEATH) ×2 IMPLANT
WIRE G 018X200 V18 (WIRE) ×2 IMPLANT
WIRE J 3MM .035X145CM (WIRE) ×2 IMPLANT

## 2019-10-05 NOTE — Op Note (Signed)
[facility]  Percutaneous Study/Intervention Procedural Note   Date of Surgery: 10/05/2019  Surgeon(s):Elira Colasanti    Assistants:none  Pre-operative Diagnosis: PAD with ulceration LLE  Post-operative diagnosis:  Same  Procedure(s) Performed:             1.  Ultrasound guidance for vascular access left femoral artery             2.  Catheter placement into left anterior tibial artery from left femoral approach             3.  Selective left lower extremity angiogram             4.  Percutaneous transluminal angioplasty of left anterior tibial artery artery from left femoral approach             5.   Percutaneous transluminal angioplasty of left popliteal artery with 5 mm diameter Lutonix drug-coated angioplasty balloon             EBL: 10 cc   Contrast: 40 cc  Fluoro Time: 3.6 minutes  Moderate Conscious Sedation Time: approximately 30 minutes using 5 mg of Versed and 100 mcg of Fentanyl              Indications:  Patient is a 64 y.o.male with a nonhealing ulceration on the left foot and a known history of severe peripheral arterial disease.  He is already status post right AKA. The patient has noninvasive study showing reduced pressures in the left foot and toes. The patient is brought in for angiography for further evaluation and potential treatment.  He is already had an abdominal aneurysm stent graft sewn up and over approach is not feasible.  Due to the limb threatening nature of the situation, angiogram was performed for attempted limb salvage. The patient is aware that if the procedure fails, amputation would be expected.  The patient also understands that even with successful revascularization, amputation may still be required due to the severity of the situation.  Risks and benefits are discussed and informed consent is obtained.   Procedure:  The patient was identified and appropriate procedural time out was performed.  The patient was then placed  supine on the table and prepped and draped in the usual sterile fashion. Moderate conscious sedation was administered during a face to face encounter with the patient throughout the procedure with my supervision of the RN administering medicines and monitoring the patient's vital signs, pulse oximetry, telemetry and mental status throughout from the start of the procedure until the patient was taken to the recovery room. Ultrasound was used to evaluate the left common femoral artery.  It was patent .  A digital ultrasound image was acquired.  A micropuncture needle was used to access the left common femoral artery under direct ultrasound guidance and a permanent image was performed.   A micropuncture wire and sheath were placed.  A 0.035 J wire was advanced without resistance and a 5Fr sheath was placed.   This was done in an antegrade fashion on the left.  Selective left lower extremity angiogram was then performed. This demonstrated the femoral endarterectomy appeared widely patent as did the profunda femoris artery and superficial femoral artery including the previously placed stents.  In the above-knee popliteal artery below the previously placed stents, there was moderate stenosis in the 60 to 70% right. It was felt that it was in the patient's best interest to proceed with intervention after these images to avoid a second  procedure and a larger amount of contrast and fluoroscopy based off of the findings from the initial angiogram.  I then used a Kumpe catheter and the advantage wire to cross the popliteal stenosis and get down into the proximal anterior tibial artery where selective imaging showed a high-grade stenosis about 3 to 4 cm beyond the origin of the anterior tibial artery in the 80 to 90% range.  This was not well seen on the initial imaging due to patient motion.  I then cross this lesion with a V 18 wire without difficulty parking this in the foot.  A 3 mm diameter by 10 cm length angioplasty  balloon was then inflated to 10 atm for 1 minute in the left anterior tibial artery with less than 10% residual stenosis after angioplasty.  Imaging also showed the posterior tibial artery to be continuous to the foot.  I then turned my attention to the popliteal lesion.  A 5 mm diameter by 10 cm length Lutonix drug-coated angioplasty balloon was inflated to 12 atm for 1 minute.  Completion imaging showed only about a 10 to 15% residual stenosis in the popliteal artery. I elected to terminate the procedure. The sheath was removed and pressure was held in the left femoral artery with excellent hemostatic result. The patient was taken to the recovery room in stable condition having tolerated the procedure well.  Findings:                            Left Lower Extremity:  The femoral endarterectomy appeared widely patent as did the profunda femoris artery and superficial femoral artery including the previously placed stents.  In the above-knee popliteal artery below the previously placed stents, there was moderate stenosis in the 60 to 70% right.  The tibial vessels were not well seen on the initial imaging but on selective anterior tibial imaging there was an 80 to 90% stenosis about 4 cm beyond the origin of the anterior tibial artery with the vessel was then patent and continuous down to the foot.  The posterior tibial artery was also patent and continuous to the foot.   Disposition: Patient was taken to the recovery room in stable condition having tolerated the procedure well.  Complications: None  Leotis Pain 10/05/2019 3:31 PM   This note was created with Dragon Medical transcription system. Any errors in dictation are purely unintentional.

## 2019-10-05 NOTE — H&P (Signed)
[facility] History & Physical Update  The patient was interviewed and re-examined.  The patient's previous History and Physical has been reviewed and is unchanged.  There is no change in the plan of care. We plan to proceed with the scheduled procedure.  Leotis Pain, MD  10/05/2019, 1:20 PM

## 2019-10-05 NOTE — Discharge Instructions (Signed)
Femoral Site Care This sheet gives you information about how to care for yourself after your procedure. Your health care provider may also give you more specific instructions. If you have problems or questions, contact your health care provider. What can I expect after the procedure? After the procedure, it is common to have:  Bruising that usually fades within 1-2 weeks.  Tenderness at the site. Follow these instructions at home: Wound care  Follow instructions from your health care provider about how to take care of your insertion site. Make sure you: ? Wash your hands with soap and water before you change your bandage (dressing). If soap and water are not available, use hand sanitizer. ? Change your dressing as told by your health care provider. ? Leave stitches (sutures), skin glue, or adhesive strips in place. These skin closures may need to stay in place for 2 weeks or longer. If adhesive strip edges start to loosen and curl up, you may trim the loose edges. Do not remove adhesive strips completely unless your health care provider tells you to do that.  Do not take baths, swim, or use a hot tub until your health care provider approves.  You may shower 24-48 hours after the procedure or as told by your health care provider. ? Gently wash the site with plain soap and water. ? Pat the area dry with a clean towel. ? Do not rub the site. This may cause bleeding.  Do not apply powder or lotion to the site. Keep the site clean and dry.  Check your femoral site every day for signs of infection. Check for: ? Redness, swelling, or pain. ? Fluid or blood. ? Warmth. ? Pus or a bad smell. Activity  For the first 2-3 days after your procedure, or as long as directed: ? Avoid climbing stairs as much as possible. ? Do not squat.  Do not lift anything that is heavier than 10 lb (4.5 kg), or the limit that you are told, until your health care provider says that it is safe.  Rest as  directed. ? Avoid sitting for a long time without moving. Get up to take short walks every 1-2 hours.  Do not drive for 24 hours if you were given a medicine to help you relax (sedative). General instructions  Take over-the-counter and prescription medicines only as told by your health care provider.  Keep all follow-up visits as told by your health care provider. This is important. Contact a health care provider if you have:  A fever or chills.  You have redness, swelling, or pain around your insertion site. Get help right away if:  The catheter insertion area swells very fast.  You pass out.  You suddenly start to sweat or your skin gets clammy.  The catheter insertion area is bleeding, and the bleeding does not stop when you hold steady pressure on the area.  The area near or just beyond the catheter insertion site becomes pale, cool, tingly, or numb. These symptoms may represent a serious problem that is an emergency. Do not wait to see if the symptoms will go away. Get medical help right away. Call your local emergency services (911 in the [place] Do not drive yourself to the hospital. Summary  After the procedure, it is common to have bruising that usually fades within 1-2 weeks.  Check your femoral site every day for signs of infection.  Do not lift anything that is heavier than 10 lb (4.5 kg), or the   limit that you are told, until your health care provider says that it is safe. This information is not intended to replace advice given to you by your health care provider. Make sure you discuss any questions you have with your health care provider. Document Revised: 05/24/2017 Document Reviewed: 05/24/2017 Elsevier Patient Education  2020 Elsevier [employer]. Moderate Conscious Sedation, Adult, Care After These instructions provide you with information about caring for yourself after your procedure. Your health care provider may also give you more specific instructions. Your  treatment has been planned according to current medical practices, but problems sometimes occur. Call your health care provider if you have any problems or questions after your procedure. What can I expect after the procedure? After your procedure, it is common:  To feel sleepy for several hours.  To feel clumsy and have poor balance for several hours.  To have poor judgment for several hours.  To vomit if you eat too soon. Follow these instructions at home: For at least 24 hours after the procedure:   Do not: ? Participate in activities where you could fall or become injured. ? Drive. ? Use heavy machinery. ? Drink alcohol. ? Take sleeping pills or medicines that cause drowsiness. ? Make important decisions or sign legal documents. ? Take care of children on your own.  Rest. Eating and drinking  Follow the diet recommended by your health care provider.  If you vomit: ? Drink water, juice, or soup when you can drink without vomiting. ? Make sure you have little or no nausea before eating solid foods. General instructions  Have a responsible adult stay with you until you are awake and alert.  Take over-the-counter and prescription medicines only as told by your health care provider.  If you smoke, do not smoke without supervision.  Keep all follow-up visits as told by your health care provider. This is important. Contact a health care provider if:  You keep feeling nauseous or you keep vomiting.  You feel light-headed.  You develop a rash.  You have a fever. Get help right away if:  You have trouble breathing. This information is not intended to replace advice given to you by your health care provider. Make sure you discuss any questions you have with your health care provider. Document Revised: 04/23/2017 Document Reviewed: 08/31/2015 Elsevier Patient Education  2020 [employer] Inc. Angiogram, Care After This sheet gives you information about how to care for  yourself after your procedure. Your doctor may also give you more specific instructions. If you have problems or questions, contact your doctor. Follow these instructions at home: Insertion site care  Follow instructions from your doctor about how to take care of your long, thin tube (catheter) insertion area. Make sure you: ? Wash your hands with soap and water before you change your bandage (dressing). If you cannot use soap and water, use hand sanitizer. ? Change your bandage as told by your doctor. ? Leave stitches (sutures), skin glue, or skin tape (adhesive) strips in place. They may need to stay in place for 2 weeks or longer. If tape strips get loose and curl up, you may trim the loose edges. Do not remove tape strips completely unless your doctor says it is okay.  Do not take baths, swim, or use a hot tub until your doctor says it is okay.  You may shower 24-48 hours after the procedure or as told by your doctor. ? Gently wash the area with plain soap and water. ?   Pat the area dry with a clean towel. ? Do not rub the area. This may cause bleeding.  Do not apply powder or lotion to the area. Keep the area clean and dry.  Check your insertion area every day for signs of infection. Check for: ? More redness, swelling, or pain. ? Fluid or blood. ? Warmth. ? Pus or a bad smell. Activity  Rest as told by your doctor, usually for 1-2 days.  Do not lift anything that is heavier than 10 lbs. (4.5 kg) or as told by your doctor.  Do not drive for 24 hours if you were given a medicine to help you relax (sedative).  Do not drive or use heavy machinery while taking prescription pain medicine. General instructions   Go back to your normal activities as told by your doctor, usually in about a week. Ask your doctor what activities are safe for you.  If the insertion area starts to bleed, lie flat and put pressure on the area. If the bleeding does not stop, get help right away. This is an  emergency.  Drink enough fluid to keep your pee (urine) clear or pale yellow.  Take over-the-counter and prescription medicines only as told by your doctor.  Keep all follow-up visits as told by your doctor. This is important. Contact a doctor if:  You have a fever.  You have chills.  You have more redness, swelling, or pain around your insertion area.  You have fluid or blood coming from your insertion area.  The insertion area feels warm to the touch.  You have pus or a bad smell coming from your insertion area.  You have more bruising around the insertion area.  Blood collects in the tissue around the insertion area (hematoma) that may be painful to the touch. Get help right away if:  You have a lot of pain in the insertion area.  The insertion area swells very fast.  The insertion area is bleeding, and the bleeding does not stop after holding steady pressure on the area.  The area near or just beyond the insertion area becomes pale, cool, tingly, or numb. These symptoms may be an emergency. Do not wait to see if the symptoms will go away. Get medical help right away. Call your local emergency services (911 in the [place] Do not drive yourself to the hospital. Summary  After the procedure, it is common to have bruising and tenderness at the long, thin tube insertion area.  After the procedure, it is important to rest and drink plenty of fluids.  Do not take baths, swim, or use a hot tub until your doctor says it is okay to do so. You may shower 24-48 hours after the procedure or as told by your doctor.  If the insertion area starts to bleed, lie flat and put pressure on the area. If the bleeding does not stop, get help right away. This is an emergency. This information is not intended to replace advice given to you by your health care provider. Make sure you discuss any questions you have with your health care provider. Document Revised: 04/23/2017 Document Reviewed:  05/05/2016 Elsevier Patient Education  2020 Elsevier Inc.  

## 2019-10-06 ENCOUNTER — Ambulatory Visit: Payer: Medicare HMO | Admitting: Registered Nurse

## 2019-10-06 ENCOUNTER — Ambulatory Visit
Admission: RE | Admit: 2019-10-06 | Discharge: 2019-10-06 | Disposition: A | Discharge status: Home / Self Care | Payer: Medicare HMO | Attending: Podiatry | Admitting: Podiatry

## 2019-10-06 ENCOUNTER — Encounter: Payer: Self-pay | Admitting: Cardiology

## 2019-10-06 ENCOUNTER — Encounter
Admission: RE | Disposition: A | Discharge status: Home / Self Care | Payer: Self-pay | Source: Home / Self Care | Attending: Podiatry

## 2019-10-06 DIAGNOSIS — I96 Gangrene, not elsewhere classified: Secondary | ICD-10-CM | POA: Diagnosis not present

## 2019-10-06 DIAGNOSIS — I081 Rheumatic disorders of both mitral and tricuspid valves: Secondary | ICD-10-CM | POA: Diagnosis not present

## 2019-10-06 DIAGNOSIS — F329 Major depressive disorder, single episode, unspecified: Secondary | ICD-10-CM | POA: Insufficient documentation

## 2019-10-06 DIAGNOSIS — I2510 Atherosclerotic heart disease of native coronary artery without angina pectoris: Secondary | ICD-10-CM | POA: Diagnosis not present

## 2019-10-06 DIAGNOSIS — L97526 Non-pressure chronic ulcer of other part of left foot with bone involvement without evidence of necrosis: Secondary | ICD-10-CM | POA: Diagnosis not present

## 2019-10-06 DIAGNOSIS — Z9981 Dependence on supplemental oxygen: Secondary | ICD-10-CM | POA: Insufficient documentation

## 2019-10-06 DIAGNOSIS — F17210 Nicotine dependence, cigarettes, uncomplicated: Secondary | ICD-10-CM | POA: Insufficient documentation

## 2019-10-06 DIAGNOSIS — E782 Mixed hyperlipidemia: Secondary | ICD-10-CM | POA: Insufficient documentation

## 2019-10-06 DIAGNOSIS — I110 Hypertensive heart disease with heart failure: Secondary | ICD-10-CM | POA: Insufficient documentation

## 2019-10-06 DIAGNOSIS — K219 Gastro-esophageal reflux disease without esophagitis: Secondary | ICD-10-CM | POA: Diagnosis not present

## 2019-10-06 DIAGNOSIS — I5022 Chronic systolic (congestive) heart failure: Secondary | ICD-10-CM | POA: Diagnosis not present

## 2019-10-06 DIAGNOSIS — Z79899 Other long term (current) drug therapy: Secondary | ICD-10-CM | POA: Insufficient documentation

## 2019-10-06 DIAGNOSIS — J449 Chronic obstructive pulmonary disease, unspecified: Secondary | ICD-10-CM | POA: Insufficient documentation

## 2019-10-06 DIAGNOSIS — Z955 Presence of coronary angioplasty implant and graft: Secondary | ICD-10-CM | POA: Diagnosis not present

## 2019-10-06 DIAGNOSIS — Z7982 Long term (current) use of aspirin: Secondary | ICD-10-CM | POA: Insufficient documentation

## 2019-10-06 DIAGNOSIS — Z888 Allergy status to other drugs, medicaments and biological substances status: Secondary | ICD-10-CM | POA: Insufficient documentation

## 2019-10-06 DIAGNOSIS — I482 Chronic atrial fibrillation, unspecified: Secondary | ICD-10-CM | POA: Insufficient documentation

## 2019-10-06 DIAGNOSIS — Z7951 Long term (current) use of inhaled steroids: Secondary | ICD-10-CM | POA: Insufficient documentation

## 2019-10-06 DIAGNOSIS — I739 Peripheral vascular disease, unspecified: Secondary | ICD-10-CM | POA: Diagnosis not present

## 2019-10-06 DIAGNOSIS — D6851 Activated protein C resistance: Secondary | ICD-10-CM | POA: Insufficient documentation

## 2019-10-06 DIAGNOSIS — Z89511 Acquired absence of right leg below knee: Secondary | ICD-10-CM | POA: Insufficient documentation

## 2019-10-06 DIAGNOSIS — M86172 Other acute osteomyelitis, left ankle and foot: Secondary | ICD-10-CM | POA: Insufficient documentation

## 2019-10-06 DIAGNOSIS — Z95810 Presence of automatic (implantable) cardiac defibrillator: Secondary | ICD-10-CM | POA: Diagnosis not present

## 2019-10-06 DIAGNOSIS — Z7901 Long term (current) use of anticoagulants: Secondary | ICD-10-CM | POA: Diagnosis not present

## 2019-10-06 DIAGNOSIS — I252 Old myocardial infarction: Secondary | ICD-10-CM | POA: Insufficient documentation

## 2019-10-06 DIAGNOSIS — L97524 Non-pressure chronic ulcer of other part of left foot with necrosis of bone: Secondary | ICD-10-CM | POA: Diagnosis not present

## 2019-10-06 DIAGNOSIS — J441 Chronic obstructive pulmonary disease with (acute) exacerbation: Secondary | ICD-10-CM | POA: Diagnosis not present

## 2019-10-06 HISTORY — PX: AMPUTATION TOE: SHX6595

## 2019-10-06 LAB — PROTIME-INR
INR: 1.1 (ref 0.8–1.2)
Prothrombin Time: 13.7 seconds (ref 11.4–15.2)

## 2019-10-06 LAB — APTT: aPTT: 40 seconds — ABNORMAL HIGH (ref 24–36)

## 2019-10-06 SURGERY — AMPUTATION, TOE
Anesthesia: General | Site: Toe | Laterality: Left

## 2019-10-06 MED ORDER — ACETAMINOPHEN 10 MG/ML IV SOLN
INTRAVENOUS | Status: AC
  Filled 2019-10-06: qty 100

## 2019-10-06 MED ORDER — IPRATROPIUM-ALBUTEROL 0.5-2.5 (3) MG/3ML IN SOLN: 3.0000 mL | Freq: Once | RESPIRATORY_TRACT | Status: AC

## 2019-10-06 MED ORDER — LIDOCAINE HCL (CARDIAC) PF 100 MG/5ML IV SOSY
PREFILLED_SYRINGE | INTRAVENOUS | Status: DC | PRN
  Administered 2019-10-06: 100 mg via INTRAVENOUS

## 2019-10-06 MED ORDER — CEFAZOLIN SODIUM-DEXTROSE 2-4 GM/100ML-% IV SOLN
INTRAVENOUS | Status: AC
  Filled 2019-10-06: qty 100

## 2019-10-06 MED ORDER — CEFAZOLIN SODIUM-DEXTROSE 2-4 GM/100ML-% IV SOLN
2.0000 g | INTRAVENOUS | Status: AC
  Administered 2019-10-06: 2 g via INTRAVENOUS

## 2019-10-06 MED ORDER — OXYCODONE-ACETAMINOPHEN 7.5-325 MG PO TABS
1.0000 | ORAL_TABLET | Freq: Four times a day (QID) | ORAL | Status: DC | PRN
  Administered 2019-10-06: 1 via ORAL

## 2019-10-06 MED ORDER — BUPIVACAINE LIPOSOME 1.3 % IJ SUSP
INTRAMUSCULAR | Status: AC
  Filled 2019-10-06: qty 20

## 2019-10-06 MED ORDER — PHENYLEPHRINE HCL (PRESSORS) 10 MG/ML IV SOLN
INTRAVENOUS | Status: DC | PRN
  Administered 2019-10-06 (×4): 200 ug via INTRAVENOUS
  Administered 2019-10-06: 100 ug via INTRAVENOUS

## 2019-10-06 MED ORDER — LIDOCAINE HCL (PF) 2 % IJ SOLN
INTRAMUSCULAR | Status: AC
  Filled 2019-10-06: qty 10

## 2019-10-06 MED ORDER — ONDANSETRON HCL 4 MG PO TABS: 4.0000 mg | ORAL_TABLET | Freq: Four times a day (QID) | ORAL | Status: DC | PRN

## 2019-10-06 MED ORDER — ACETAMINOPHEN 10 MG/ML IV SOLN
INTRAVENOUS | Status: DC | PRN
  Administered 2019-10-06: 1000 mg via INTRAVENOUS

## 2019-10-06 MED ORDER — BUPIVACAINE HCL (PF) 0.25 % IJ SOLN
INTRAMUSCULAR | Status: AC
  Filled 2019-10-06: qty 30

## 2019-10-06 MED ORDER — ONDANSETRON HCL 4 MG/2ML IJ SOLN: 4.0000 mg | Freq: Four times a day (QID) | INTRAMUSCULAR | Status: DC | PRN

## 2019-10-06 MED ORDER — ONDANSETRON HCL 4 MG/2ML IJ SOLN
INTRAMUSCULAR | Status: DC | PRN
  Administered 2019-10-06: 4 mg via INTRAVENOUS

## 2019-10-06 MED ORDER — KETAMINE HCL 10 MG/ML IJ SOLN
INTRAMUSCULAR | Status: DC | PRN
  Administered 2019-10-06: 10 mg via INTRAVENOUS
  Administered 2019-10-06: 20 mg via INTRAVENOUS

## 2019-10-06 MED ORDER — OXYCODONE-ACETAMINOPHEN 7.5-325 MG PO TABS
ORAL_TABLET | ORAL | Status: AC
  Filled 2019-10-06: qty 1

## 2019-10-06 MED ORDER — FENTANYL CITRATE (PF) 100 MCG/2ML IJ SOLN
INTRAMUSCULAR | Status: AC
  Filled 2019-10-06: qty 2

## 2019-10-06 MED ORDER — DEXAMETHASONE SODIUM PHOSPHATE 10 MG/ML IJ SOLN
INTRAMUSCULAR | Status: DC | PRN
  Administered 2019-10-06: 8 mg via INTRAVENOUS

## 2019-10-06 MED ORDER — IPRATROPIUM-ALBUTEROL 0.5-2.5 (3) MG/3ML IN SOLN
RESPIRATORY_TRACT | Status: AC
  Administered 2019-10-06: 3 mL via RESPIRATORY_TRACT
  Filled 2019-10-06: qty 3

## 2019-10-06 MED ORDER — ONDANSETRON HCL 4 MG/2ML IJ SOLN: 4.0000 mg | Freq: Once | INTRAMUSCULAR | Status: DC | PRN

## 2019-10-06 MED ORDER — ONDANSETRON HCL 4 MG/2ML IJ SOLN
INTRAMUSCULAR | Status: AC
  Filled 2019-10-06: qty 2

## 2019-10-06 MED ORDER — SODIUM CHLORIDE 0.9 % IV SOLN
INTRAVENOUS | Status: DC | PRN
  Administered 2019-10-06: 50 ug/min via INTRAVENOUS

## 2019-10-06 MED ORDER — DEXAMETHASONE SODIUM PHOSPHATE 10 MG/ML IJ SOLN
INTRAMUSCULAR | Status: AC
  Filled 2019-10-06: qty 1

## 2019-10-06 MED ORDER — FENTANYL CITRATE (PF) 100 MCG/2ML IJ SOLN
INTRAMUSCULAR | Status: DC | PRN
  Administered 2019-10-06 (×2): 25 ug via INTRAVENOUS

## 2019-10-06 MED ORDER — PROPOFOL 10 MG/ML IV BOLUS
INTRAVENOUS | Status: DC | PRN
  Administered 2019-10-06: 80 mg via INTRAVENOUS
  Administered 2019-10-06: 50 mg via INTRAVENOUS

## 2019-10-06 MED ORDER — LACTATED RINGERS IV SOLN: INTRAVENOUS | Status: DC

## 2019-10-06 MED ORDER — BUPIVACAINE LIPOSOME 1.3 % IJ SUSP
INTRAMUSCULAR | Status: DC | PRN
  Administered 2019-10-06: 10 mL

## 2019-10-06 MED ORDER — LIDOCAINE HCL (PF) 1 % IJ SOLN
INTRAMUSCULAR | Status: AC
  Filled 2019-10-06: qty 30

## 2019-10-06 MED ORDER — FENTANYL CITRATE (PF) 100 MCG/2ML IJ SOLN
25.0000 ug | INTRAMUSCULAR | Status: DC | PRN
  Administered 2019-10-06: 25 ug via INTRAVENOUS
  Administered 2019-10-06: 50 ug via INTRAVENOUS

## 2019-10-06 MED ORDER — BUPIVACAINE HCL (PF) 0.5 % IJ SOLN
INTRAMUSCULAR | Status: AC
  Filled 2019-10-06: qty 30

## 2019-10-06 MED ORDER — BUPIVACAINE-EPINEPHRINE (PF) 0.25% -1:200000 IJ SOLN
INTRAMUSCULAR | Status: DC | PRN
  Administered 2019-10-06: 10 mL

## 2019-10-06 MED ORDER — POVIDONE-IODINE 7.5 % EX SOLN
Freq: Once | CUTANEOUS | Status: DC
  Filled 2019-10-06: qty 118

## 2019-10-06 SURGICAL SUPPLY — 47 items
BLADE OSC/SAGITTAL MD 5.5X18 (BLADE) ×2 IMPLANT
BLADE SURG MINI STRL (BLADE) ×2 IMPLANT
BNDG CONFORM 2 STRL LF (GAUZE/BANDAGES/DRESSINGS) ×2 IMPLANT
BNDG CONFORM 3 STRL LF (GAUZE/BANDAGES/DRESSINGS) ×2 IMPLANT
BNDG ELASTIC 3X5.8 VLCR STR LF (GAUZE/BANDAGES/DRESSINGS) ×2 IMPLANT
BNDG ELASTIC 4X5.8 VLCR NS LF (GAUZE/BANDAGES/DRESSINGS) ×2 IMPLANT
BNDG ESMARK 4X12 TAN STRL LF (GAUZE/BANDAGES/DRESSINGS) ×2 IMPLANT
BNDG GAUZE 4.5X4.1 6PLY STRL (MISCELLANEOUS) ×2 IMPLANT
CANISTER SUCT 1200ML W/VALVE (MISCELLANEOUS) ×2 IMPLANT
COVER WAND RF STERILE (DRAPES) ×2 IMPLANT
CUFF TOURN SGL QUICK 12 (TOURNIQUET CUFF) IMPLANT
CUFF TOURN SGL QUICK 18X4 (TOURNIQUET CUFF) IMPLANT
DRAPE FLUOR MINI C-ARM 54X84 (DRAPES) ×2 IMPLANT
DRAPE XRAY CASSETTE 23X24 (DRAPES) ×2 IMPLANT
DURAPREP 26ML APPLICATOR (WOUND CARE) ×2 IMPLANT
ELECT REM PT RETURN 9FT ADLT (ELECTROSURGICAL) ×2
ELECTRODE REM PT RTRN 9FT ADLT (ELECTROSURGICAL) ×1 IMPLANT
GAUZE PACKING IODOFORM 1/2 (PACKING) ×2 IMPLANT
GAUZE SPONGE 4X4 12PLY STRL (GAUZE/BANDAGES/DRESSINGS) ×2 IMPLANT
GAUZE XEROFORM 1X8 LF (GAUZE/BANDAGES/DRESSINGS) ×2 IMPLANT
GLOVE BIO SURGEON STRL SZ7.5 (GLOVE) ×2 IMPLANT
GLOVE INDICATOR 8.0 STRL GRN (GLOVE) ×2 IMPLANT
GOWN STRL REUS W/ TWL LRG LVL3 (GOWN DISPOSABLE) ×2 IMPLANT
GOWN STRL REUS W/TWL LRG LVL3 (GOWN DISPOSABLE) ×2
KIT TURNOVER KIT A (KITS) ×2 IMPLANT
LABEL OR SOLS (LABEL) ×2 IMPLANT
NEEDLE FILTER BLUNT 18X 1/2SAF (NEEDLE) ×1
NEEDLE FILTER BLUNT 18X1 1/2 (NEEDLE) ×1 IMPLANT
NEEDLE HYPO 25X1 1.5 SAFETY (NEEDLE) ×2 IMPLANT
NS IRRIG 500ML POUR BTL (IV SOLUTION) ×2 IMPLANT
PACK EXTREMITY (MISCELLANEOUS) ×2 IMPLANT
PAD ABD DERMACEA PRESS 5X9 (GAUZE/BANDAGES/DRESSINGS) ×4 IMPLANT
PULSAVAC PLUS IRRIG FAN TIP (DISPOSABLE) ×2
SHIELD FULL FACE ANTIFOG 7M (MISCELLANEOUS) ×2 IMPLANT
SOL .9 NS 3000ML IRR  AL (IV SOLUTION) ×1
SOL .9 NS 3000ML IRR UROMATIC (IV SOLUTION) ×1 IMPLANT
STOCKINETTE M/LG 89821 (MISCELLANEOUS) ×2 IMPLANT
STRAP SAFETY 5IN WIDE (MISCELLANEOUS) ×2 IMPLANT
SUT ETHILON 2 0 FS 18 (SUTURE) ×6 IMPLANT
SUT ETHILON 3-0 FS-10 30 BLK (SUTURE) ×2
SUT ETHILON 5-0 FS-2 18 BLK (SUTURE) ×2 IMPLANT
SUT VIC AB 2-0 SH 27 (SUTURE) ×1
SUT VIC AB 2-0 SH 27XBRD (SUTURE) ×1 IMPLANT
SUT VIC AB 4-0 FS2 27 (SUTURE) ×2 IMPLANT
SUTURE EHLN 3-0 FS-10 30 BLK (SUTURE) ×1 IMPLANT
SYR 10ML LL (SYRINGE) ×6 IMPLANT
TIP FAN IRRIG PULSAVAC PLUS (DISPOSABLE) ×1 IMPLANT

## 2019-10-06 NOTE — Transfer of Care (Signed)
Immediate Anesthesia Transfer of Care Note  Patient: Tyler [name]  Procedure(s) Performed: AMPUTATION TOE MPJ T1,T2 LEFT (Left Toe)  Patient Location: PACU  Anesthesia Type:General  Level of Consciousness: drowsy  Airway & Oxygen Therapy: Patient Spontanous Breathing and Patient connected to face mask oxygen  Post-op Assessment: Report given to RN and Post -op Vital signs reviewed and stable  Post vital signs: Reviewed and stable  Last Vitals:  Vitals Value Taken Time  BP 111/55 10/06/19 1134  Temp    Pulse 78 10/06/19 1135  Resp 16 10/06/19 1135  SpO2 100 % 10/06/19 1135  Vitals shown include unvalidated device data.  Last Pain:  Vitals:   10/06/19 0823  PainSc: 6       Patients Stated Pain Goal: 1 ([id number])  Complications: No apparent anesthesia complications

## 2019-10-06 NOTE — Anesthesia Procedure Notes (Signed)
Procedure Name: LMA Insertion Date/Time: 10/06/2019 10:21 AM Performed by: Lia Foyer, CRNA Pre-anesthesia Checklist: Patient identified, Emergency Drugs available, Suction available and Patient being monitored Patient Re-evaluated:Patient Re-evaluated prior to induction Oxygen Delivery Method: Circle system utilized Preoxygenation: Pre-oxygenation with 100% oxygen Induction Type: IV induction Ventilation: Mask ventilation without difficulty LMA: LMA flexible inserted LMA Size: 4.5 Tube type: Oral Number of attempts: 1 Airway Equipment and Method: Patient positioned with wedge pillow Placement Confirmation: positive ETCO2 and breath sounds checked- equal and bilateral Tube secured with: Tape Dental Injury: Teeth and Oropharynx as per pre-operative assessment

## 2019-10-06 NOTE — Op Note (Signed)
Operative note   Surgeon:John Williamsen Lawyer: None    Preop diagnosis: Gangrene left forefoot    Postop diagnosis: Same    Procedure: 1.  Excision of residual great toe left foot  2.  Amputation left second toe MPJ 3.  Amputation left third toe MPJ 4.  Excision distal first metatarsal head 5.  Excision distal second metatarsal head 6.  Excision distal third metatarsal head    EBL: Minimal    Anesthesia:local and general.  Local consisted of a one-to-one mixture of 0 0.25% bupivacaine and Exparel long-acting anesthetic.    Hemostasis: None    Specimen: Deep wound culture and gangrenous forefoot    Complications: None    Operative indications:Tyler [name] is an 64 y.o. that presents today for surgical intervention.  The risks/benefits/alternatives/complications have been discussed and consent has been given.    Procedure:  Patient was brought into the OR and placed on the operating table in thesupine position. After anesthesia was obtained theleft lower extremity was prepped and draped in usual sterile fashion.  Attention was directed to the distal left foot where the residual distal portion of the great toe was noted to be infected with gangrenous changes of the second and third toes.  At this time full-thickness skin flaps were created at the level of the metatarsophalangeal joints.  The residual great toe second toe and third toes were then disarticulated at the metatarsophalangeal joints.  These were then removed from the surgical field in toto.  The wounds were flushed with copious amounts of irrigation.  At this time there was still residual tension to the attempted closure site.  The distal first metatarsal second metatarsal and third metatarsal heads were then excised.  At this time I was able to close the distal skin flaps.  The wounds were all flushed with copious amounts of irrigation.  Bleeders were Bovie cauterized as appropriate.  Closure was then performed with a  2-0 Vicryl and 2-0 nylon.  A bulky sterile dressing was then applied to the left foot.    Patient tolerated the procedure and anesthesia well.  Was transported from the OR to the PACU with all vital signs stable and vascular status intact. To be discharged per routine protocol.  Will follow up in approximately 1 week in the outpatient clinic.

## 2019-10-06 NOTE — Anesthesia Postprocedure Evaluation (Signed)
Anesthesia Post Note  Patient: Tyler [name]  Procedure(s) Performed: AMPUTATION TOE MPJ T1,T2 LEFT (Left Toe)  Patient location during evaluation: PACU Anesthesia Type: General Level of consciousness: awake and alert and oriented Pain management: pain level controlled Vital Signs Assessment: post-procedure vital signs reviewed and stable Respiratory status: spontaneous breathing, nonlabored ventilation and respiratory function stable Cardiovascular status: blood pressure returned to baseline and stable Postop Assessment: no signs of nausea or vomiting Anesthetic complications: no     Last Vitals:  Vitals:   10/06/19 1249 10/06/19 1317  BP: 103/84 123/76  Pulse: 74 74  Resp: (!) 22 18  Temp: 36.9 C 36.7 C  SpO2: 100% 94%    Last Pain:  Vitals:   10/06/19 1317  TempSrc: Temporal  PainSc: 5                  Laportia Carley

## 2019-10-06 NOTE — Anesthesia Preprocedure Evaluation (Addendum)
Anesthesia Evaluation  Patient identified by MRN, date of birth, ID band Patient awake    Reviewed: Allergy & Precautions, NPO status , Patient's Chart, lab work & pertinent test results  History of Anesthesia Complications Negative for: history of anesthetic complications  Airway Mallampati: II  TM Distance: >3 FB Neck ROM: Full    Dental  (+) Poor Dentition, Missing   Pulmonary neg sleep apnea, COPD,  COPD inhaler and oxygen dependent, Patient abstained from smoking., former smoker,    breath sounds clear to auscultation- rhonchi (-) wheezing      Cardiovascular hypertension, Pt. on medications + CAD, + Past MI, + Cardiac Stents, + Peripheral Vascular Disease and +CHF  (-) CABG + dysrhythmias Atrial Fibrillation + pacemaker + Cardiac Defibrillator  Rhythm:Regular Rate:Normal - Systolic murmurs and - Diastolic murmurs NM stress test 12/23/18: Mild to moderate LV systolic dysfunction Severe intensity large perfusion abnormality of the apical myocardium  consistent with previous infarct and/or scar without evidence of  myocardial ischemia  Echo 12/19/18: MODERATE LV SYSTOLIC DYSFUNCTION WITH AN ESTIMATED EF = 30 % NORMAL RIGHT VENTRICULAR SYSTOLIC FUNCTION MODERATE-TO-SEVERE MITRAL AND TRICUSPID VALVE INSUFFICIENCY NO VALVULAR STENOSIS MODERATE BIATRIAL ENLARGEMENT MILD LV ENLARGEMENT MILD RV ENLARGEMENT PACER WIRE NOTED   Neuro/Psych neg Seizures PSYCHIATRIC DISORDERS Anxiety Depression negative neurological ROS     GI/Hepatic Neg liver ROS, GERD  ,  Endo/Other  negative endocrine ROSneg diabetes  Renal/GU negative Renal ROS     Musculoskeletal  (+) Arthritis ,   Abdominal (+) - obese,   Peds  Hematology negative hematology ROS (+)   Anesthesia Other Findings Past Medical History: No date: AICD (automatic cardioverter/defibrillator) present No date: Anxiety No date: Arthritis 09/2019: Atherosclerosis of  artery of extremity with ulceration (HCC)     Comment:  left foot s/p toe amp requiring debridement and futher               toe amputations. No date: Atrial fibrillation (HCC) No date: Cervical spinal stenosis     Comment:  with neuropathy No date: CHF (congestive heart failure) (HCC) No date: Constipation No date: COPD (chronic obstructive pulmonary disease) (HCC) No date: Coronary artery disease No date: Depression No date: Dyspnea No date: Dysrhythmia     Comment:  atrial fibrillation No date: Emphysema of lung (HCC) No date: GERD (gastroesophageal reflux disease) No date: Hypertension No date: Lung nodule seen on imaging study     Comment:  being followed by dr. [name]. just watching it for last               few years, without change 2004: Myocardial infarction [facility])     Comment:  stent placed, pacemaker implanted 2005 No date: Oxygen dependent     Comment:  requires 2L nasal prong oxygen 24 hours a day No date: Peripheral vascular disease ([place]) [id number]: Presence of permanent cardiac pacemaker   Reproductive/Obstetrics                            Anesthesia Physical Anesthesia Plan  ASA: IV  Anesthesia Plan: General   Post-op Pain Management:    Induction: Intravenous  PONV Risk Score and Plan: 1 and Ondansetron and Treatment may vary due to age or medical condition  Airway Management Planned: LMA  Additional Equipment:   Intra-op Plan:   Post-operative Plan:   Informed Consent: I have reviewed the patients History and Physical, chart, labs and discussed the procedure including the risks,  benefits and alternatives for the proposed anesthesia with the patient or authorized representative who has indicated his/her understanding and acceptance.     Dental advisory given  Plan Discussed with: CRNA and Anesthesiologist  Anesthesia Plan Comments:         Anesthesia Quick Evaluation

## 2019-10-06 NOTE — Progress Notes (Signed)
pts teeth placed back in mouth  

## 2019-10-06 NOTE — Discharge Instructions (Signed)
[facility]  POST OPERATIVE INSTRUCTIONS FOR DR. TROXLER, DR. FOWLER, AND DR. B[facility] POD[facility]ATRY [facility]PART[facility]   1. Take your medication as prescribed.  Pain medication should be taken only as needed.  2. Keep the dressing clean, dry and intact.  3. Keep your foot elevated above the heart level for the first 48 hours.  4. Walking to the bathroom and brief periods of walking are acceptable, unless we have instructed you to be non-weight bearing.  5. Always wear your post-op shoe when walking.  Always use your crutches if you are to be non-weight bearing.  6. Do not take a shower. Baths are permissible as long as the foot is kept out of the water.   7. Every hour you are awake:  - Bend your knee 15 times. - Flex foot 15 times - Massage calf 15 times  8. Call [facility] ([phone]) if any of the following problems occur: - You develop a temperature or fever. - The bandage becomes saturated with blood. - Medication does not stop your pain. - Injury of the foot occurs. - Any symptoms of infection including redness, odor, or red streaks running from wound.  AMBULATORY SURGERY  DISCHARGE INSTRUCTIONS  1) The drugs that you were given will stay in your system until tomorrow so for the next 24 hours you should not: A) Drive an automobile B) Make any legal decisions C) Drink any alcoholic beverage  2) You may resume regular meals tomorrow.  Today it is better to start with liquids and gradually work up to solid foods. You may eat anything you prefer, but it is better to start with liquids, then soup and crackers, and gradually work up to solid foods.  3) Please notify your doctor immediately if you have any unusual bleeding, trouble breathing, redness and pain at the surgery site, drainage, fever, or pain not relieved by medication.  4) Additional Instructions:   Please contact your physician with any problems or Same  Day Surgery at [phone], Monday through Friday 6 am to 4 pm, or [place] [facility] [place] Main number at [phone]. 

## 2019-10-06 NOTE — H&P (Signed)
HISTORY AND PHYSICAL INTERVAL NOTE:  10/06/2019  9:53 AM  Tyler [name]  has presented today for surgery, with the diagnosis of I73.9 PERIPHERAL VASCULAR DISEASE L97.524 ULCER LEFT FOOT I96 GANGRENE OF TOE.  The various methods of treatment have been discussed with the patient.  No guarantees were given.  After consideration of risks, benefits and other options for treatment, the patient has consented to surgery.  I have reviewed the patients' chart and labs.     A history and physical examination was performed in my office.  The patient was reexamined.  There have been no changes to this history and physical examination.  Discussion in the preop holding area was performed today.  At 945 this morning.  Patient was concerned about his fourth and fifth toes and stated if these were becoming necrotic he would give consent for amputation of the fourth and fifth toes as appropriate.  Consent was amended and initialed by myself patient and nurse.    Samara Deist A

## 2019-10-10 LAB — AEROBIC/ANAEROBIC CULTURE W GRAM STAIN (SURGICAL/DEEP WOUND): Gram Stain: NONE SEEN

## 2019-10-10 LAB — SURGICAL PATHOLOGY

## 2019-10-11 ENCOUNTER — Telehealth: Payer: Self-pay

## 2019-10-11 NOTE — Telephone Encounter (Signed)
Volunteer support call made, message left

## 2019-10-12 ENCOUNTER — Telehealth (INDEPENDENT_AMBULATORY_CARE_PROVIDER_SITE_OTHER): Payer: Self-pay | Admitting: Vascular Surgery

## 2019-10-12 DIAGNOSIS — I739 Peripheral vascular disease, unspecified: Secondary | ICD-10-CM | POA: Diagnosis not present

## 2019-10-12 DIAGNOSIS — I482 Chronic atrial fibrillation, unspecified: Secondary | ICD-10-CM | POA: Diagnosis not present

## 2019-10-12 DIAGNOSIS — I890 Lymphedema, not elsewhere classified: Secondary | ICD-10-CM | POA: Diagnosis not present

## 2019-10-12 DIAGNOSIS — I96 Gangrene, not elsewhere classified: Secondary | ICD-10-CM | POA: Diagnosis not present

## 2019-10-12 DIAGNOSIS — L97524 Non-pressure chronic ulcer of other part of left foot with necrosis of bone: Secondary | ICD-10-CM | POA: Diagnosis not present

## 2019-10-12 NOTE — Telephone Encounter (Signed)
I can't call him directly, just do to being packed with patients today but looking at the operative report, Dr. Lucky Cowboy worked on the artery behind his knee, as well as one of the arteries in his calf.  The op report says that it opened up well.  Based on what Dr. Lucky Cowboy repaired, I'm optimistic about his wound healing. We should be able to tell better when he comes in for his follow up studies.

## 2019-10-12 NOTE — Telephone Encounter (Signed)
That is odd they didn't schedule them but one month with ABIs, me or [name].  Also, if his leg needs to be re-wrapped we can do that if it gets to swelling they just need to let us know

## 2019-10-12 NOTE — Telephone Encounter (Signed)
Patient would like to know when he should have follow up studies

## 2019-10-12 NOTE — Telephone Encounter (Signed)
Patient stated that Dr Vickki Muff office did unna wrap today and will rewrap next week since they are dressing his foot

## 2019-10-13 ENCOUNTER — Encounter (INDEPENDENT_AMBULATORY_CARE_PROVIDER_SITE_OTHER): Payer: Medicare HMO

## 2019-10-16 DIAGNOSIS — L97529 Non-pressure chronic ulcer of other part of left foot with unspecified severity: Secondary | ICD-10-CM | POA: Diagnosis not present

## 2019-10-17 ENCOUNTER — Inpatient Hospital Stay
Admission: EM | Admit: 2019-10-17 | Discharge: 2019-10-24 | DRG: 475 | Disposition: A | Discharge status: Home Health | Payer: Medicare HMO | Attending: Internal Medicine | Admitting: Internal Medicine

## 2019-10-17 ENCOUNTER — Emergency Department: Payer: Medicare HMO

## 2019-10-17 ENCOUNTER — Other Ambulatory Visit: Payer: Self-pay

## 2019-10-17 DIAGNOSIS — Z955 Presence of coronary angioplasty implant and graft: Secondary | ICD-10-CM | POA: Diagnosis not present

## 2019-10-17 DIAGNOSIS — J439 Emphysema, unspecified: Secondary | ICD-10-CM | POA: Diagnosis not present

## 2019-10-17 DIAGNOSIS — M79672 Pain in left foot: Secondary | ICD-10-CM | POA: Diagnosis not present

## 2019-10-17 DIAGNOSIS — M86172 Other acute osteomyelitis, left ankle and foot: Secondary | ICD-10-CM | POA: Diagnosis not present

## 2019-10-17 DIAGNOSIS — I110 Hypertensive heart disease with heart failure: Secondary | ICD-10-CM | POA: Diagnosis present

## 2019-10-17 DIAGNOSIS — F418 Other specified anxiety disorders: Secondary | ICD-10-CM | POA: Diagnosis not present

## 2019-10-17 DIAGNOSIS — D62 Acute posthemorrhagic anemia: Secondary | ICD-10-CM | POA: Diagnosis not present

## 2019-10-17 DIAGNOSIS — Z7982 Long term (current) use of aspirin: Secondary | ICD-10-CM

## 2019-10-17 DIAGNOSIS — N400 Enlarged prostate without lower urinary tract symptoms: Secondary | ICD-10-CM | POA: Diagnosis present

## 2019-10-17 DIAGNOSIS — F329 Major depressive disorder, single episode, unspecified: Secondary | ICD-10-CM | POA: Diagnosis present

## 2019-10-17 DIAGNOSIS — E871 Hypo-osmolality and hyponatremia: Secondary | ICD-10-CM | POA: Diagnosis present

## 2019-10-17 DIAGNOSIS — Z03818 Encounter for observation for suspected exposure to other biological agents ruled out: Secondary | ICD-10-CM | POA: Diagnosis not present

## 2019-10-17 DIAGNOSIS — L03116 Cellulitis of left lower limb: Secondary | ICD-10-CM | POA: Diagnosis present

## 2019-10-17 DIAGNOSIS — Z79899 Other long term (current) drug therapy: Secondary | ICD-10-CM

## 2019-10-17 DIAGNOSIS — D638 Anemia in other chronic diseases classified elsewhere: Secondary | ICD-10-CM | POA: Diagnosis present

## 2019-10-17 DIAGNOSIS — M7989 Other specified soft tissue disorders: Secondary | ICD-10-CM | POA: Diagnosis not present

## 2019-10-17 DIAGNOSIS — I252 Old myocardial infarction: Secondary | ICD-10-CM | POA: Diagnosis not present

## 2019-10-17 DIAGNOSIS — Z9981 Dependence on supplemental oxygen: Secondary | ICD-10-CM

## 2019-10-17 DIAGNOSIS — M79605 Pain in left leg: Secondary | ICD-10-CM | POA: Diagnosis not present

## 2019-10-17 DIAGNOSIS — M86072 Acute hematogenous osteomyelitis, left ankle and foot: Secondary | ICD-10-CM | POA: Diagnosis not present

## 2019-10-17 DIAGNOSIS — Z89611 Acquired absence of right leg above knee: Secondary | ICD-10-CM

## 2019-10-17 DIAGNOSIS — L089 Local infection of the skin and subcutaneous tissue, unspecified: Secondary | ICD-10-CM | POA: Diagnosis not present

## 2019-10-17 DIAGNOSIS — Z87891 Personal history of nicotine dependence: Secondary | ICD-10-CM

## 2019-10-17 DIAGNOSIS — I739 Peripheral vascular disease, unspecified: Secondary | ICD-10-CM | POA: Diagnosis not present

## 2019-10-17 DIAGNOSIS — L02612 Cutaneous abscess of left foot: Secondary | ICD-10-CM | POA: Diagnosis present

## 2019-10-17 DIAGNOSIS — I96 Gangrene, not elsewhere classified: Secondary | ICD-10-CM | POA: Diagnosis present

## 2019-10-17 DIAGNOSIS — Z8249 Family history of ischemic heart disease and other diseases of the circulatory system: Secondary | ICD-10-CM

## 2019-10-17 DIAGNOSIS — Z888 Allergy status to other drugs, medicaments and biological substances status: Secondary | ICD-10-CM

## 2019-10-17 DIAGNOSIS — Z20822 Contact with and (suspected) exposure to covid-19: Secondary | ICD-10-CM | POA: Diagnosis present

## 2019-10-17 DIAGNOSIS — J449 Chronic obstructive pulmonary disease, unspecified: Secondary | ICD-10-CM | POA: Diagnosis present

## 2019-10-17 DIAGNOSIS — M4802 Spinal stenosis, cervical region: Secondary | ICD-10-CM | POA: Diagnosis present

## 2019-10-17 DIAGNOSIS — Z89422 Acquired absence of other left toe(s): Secondary | ICD-10-CM

## 2019-10-17 DIAGNOSIS — E782 Mixed hyperlipidemia: Secondary | ICD-10-CM | POA: Diagnosis not present

## 2019-10-17 DIAGNOSIS — G629 Polyneuropathy, unspecified: Secondary | ICD-10-CM | POA: Diagnosis present

## 2019-10-17 DIAGNOSIS — M869 Osteomyelitis, unspecified: Secondary | ICD-10-CM | POA: Diagnosis not present

## 2019-10-17 DIAGNOSIS — R0602 Shortness of breath: Secondary | ICD-10-CM

## 2019-10-17 DIAGNOSIS — I5022 Chronic systolic (congestive) heart failure: Secondary | ICD-10-CM | POA: Diagnosis present

## 2019-10-17 DIAGNOSIS — K219 Gastro-esophageal reflux disease without esophagitis: Secondary | ICD-10-CM | POA: Diagnosis present

## 2019-10-17 DIAGNOSIS — I2510 Atherosclerotic heart disease of native coronary artery without angina pectoris: Secondary | ICD-10-CM | POA: Diagnosis present

## 2019-10-17 DIAGNOSIS — Z95810 Presence of automatic (implantable) cardiac defibrillator: Secondary | ICD-10-CM | POA: Diagnosis not present

## 2019-10-17 DIAGNOSIS — F419 Anxiety disorder, unspecified: Secondary | ICD-10-CM | POA: Diagnosis present

## 2019-10-17 DIAGNOSIS — L97524 Non-pressure chronic ulcer of other part of left foot with necrosis of bone: Secondary | ICD-10-CM | POA: Diagnosis not present

## 2019-10-17 DIAGNOSIS — I10 Essential (primary) hypertension: Secondary | ICD-10-CM | POA: Diagnosis not present

## 2019-10-17 DIAGNOSIS — E11628 Type 2 diabetes mellitus with other skin complications: Secondary | ICD-10-CM | POA: Diagnosis not present

## 2019-10-17 DIAGNOSIS — Z7901 Long term (current) use of anticoagulants: Secondary | ICD-10-CM

## 2019-10-17 DIAGNOSIS — J441 Chronic obstructive pulmonary disease with (acute) exacerbation: Secondary | ICD-10-CM | POA: Diagnosis not present

## 2019-10-17 LAB — CBC WITH DIFFERENTIAL/PLATELET
Abs Immature Granulocytes: 0.04 10*3/uL (ref 0.00–0.07)
Basophils Absolute: 0 10*3/uL (ref 0.0–0.1)
Basophils Relative: 0 %
Eosinophils Absolute: 0.2 10*3/uL (ref 0.0–0.5)
Eosinophils Relative: 2 %
HCT: 30.4 % — ABNORMAL LOW (ref 39.0–52.0)
Hemoglobin: 10 g/dL — ABNORMAL LOW (ref 13.0–17.0)
Immature Granulocytes: 0 %
Lymphocytes Relative: 10 %
Lymphs Abs: 1.1 10*3/uL (ref 0.7–4.0)
MCH: 28.8 pg (ref 26.0–34.0)
MCHC: 32.9 g/dL (ref 30.0–36.0)
MCV: 87.6 fL (ref 80.0–100.0)
Monocytes Absolute: 1.2 10*3/uL — ABNORMAL HIGH (ref 0.1–1.0)
Monocytes Relative: 12 %
Neutro Abs: 7.6 10*3/uL (ref 1.7–7.7)
Neutrophils Relative %: 76 %
Platelets: 362 10*3/uL (ref 150–400)
RBC: 3.47 MIL/uL — ABNORMAL LOW (ref 4.22–5.81)
RDW: 13 % (ref 11.5–15.5)
WBC: 10.2 10*3/uL (ref 4.0–10.5)
nRBC: 0 % (ref 0.0–0.2)

## 2019-10-17 LAB — COMPREHENSIVE METABOLIC PANEL
ALT: 14 U/L (ref 0–44)
AST: 23 U/L (ref 15–41)
Albumin: 3.3 g/dL — ABNORMAL LOW (ref 3.5–5.0)
Alkaline Phosphatase: 69 U/L (ref 38–126)
Anion gap: 9 (ref 5–15)
BUN: 6 mg/dL — ABNORMAL LOW (ref 8–23)
CO2: 29 mmol/L (ref 22–32)
Calcium: 8.9 mg/dL (ref 8.9–10.3)
Chloride: 88 mmol/L — ABNORMAL LOW (ref 98–111)
Creatinine, Ser: 0.71 mg/dL (ref 0.61–1.24)
GFR calc Af Amer: >60 mL/min (ref >60)
GFR calc non Af Amer: >60 mL/min (ref >60)
Glucose, Bld: 102 mg/dL — ABNORMAL HIGH (ref 70–99)
Potassium: 4.7 mmol/L (ref 3.5–5.1)
Sodium: 126 mmol/L — ABNORMAL LOW (ref 135–145)
Total Bilirubin: 0.5 mg/dL (ref 0.3–1.2)
Total Protein: 6.7 g/dL (ref 6.5–8.1)

## 2019-10-17 LAB — LACTIC ACID, PLASMA: Lactic Acid, Venous: 0.9 mmol/L (ref 0.5–1.9)

## 2019-10-17 LAB — PROTIME-INR
INR: 2.3 — ABNORMAL HIGH (ref 0.8–1.2)
Prothrombin Time: 24.5 seconds — ABNORMAL HIGH (ref 11.4–15.2)

## 2019-10-17 MED ORDER — MORPHINE SULFATE (PF) 4 MG/ML IV SOLN
4.0000 mg | Freq: Once | INTRAVENOUS | Status: AC
  Administered 2019-10-17: 4 mg via INTRAVENOUS

## 2019-10-17 MED ORDER — VANCOMYCIN HCL IN DEXTROSE 1-5 GM/200ML-% IV SOLN
1000.0000 mg | Freq: Once | INTRAVENOUS | Status: AC
  Administered 2019-10-17: 1000 mg via INTRAVENOUS
  Filled 2019-10-17: qty 200

## 2019-10-17 MED ORDER — ONDANSETRON HCL 4 MG/2ML IJ SOLN: 4.0000 mg | Freq: Once | INTRAMUSCULAR | Status: AC

## 2019-10-17 MED ORDER — SODIUM CHLORIDE 0.9 % IV SOLN
2.0000 g | Freq: Once | INTRAVENOUS | Status: AC
  Administered 2019-10-17: 2 g via INTRAVENOUS
  Filled 2019-10-17: qty 20

## 2019-10-17 MED ORDER — MORPHINE SULFATE (PF) 4 MG/ML IV SOLN
INTRAVENOUS | Status: AC
  Filled 2019-10-17: qty 1

## 2019-10-17 MED ORDER — IPRATROPIUM-ALBUTEROL 0.5-2.5 (3) MG/3ML IN SOLN
3.0000 mL | Freq: Once | RESPIRATORY_TRACT | Status: AC
  Administered 2019-10-17: 3 mL via RESPIRATORY_TRACT
  Filled 2019-10-17: qty 3

## 2019-10-17 MED ORDER — ONDANSETRON HCL 4 MG/2ML IJ SOLN
INTRAMUSCULAR | Status: AC
  Administered 2019-10-17: 4 mg via INTRAVENOUS
  Filled 2019-10-17: qty 2

## 2019-10-17 NOTE — ED Provider Notes (Signed)
[facility] Emergency Department Provider Note  ____________________________________________   First MD Initiated Contact with Patient 10/17/19 2315     (approximate)  I have reviewed the triage vital signs and the nursing notes.   HISTORY  Chief Complaint Post-op Problem    HPI Tyler [name] is a 64 y.o. male with below list of previous medical conditions including right above-the-knee amputation and recent left foot amputation of toes 1 through 3 on May 15 by Dr. Vickki Muff presents to the emergency department secondary to worsening pain swelling redness purulent drainage and dark discoloration at the surgical site.  Patient denies any fever afebrile on presentation.  Patient states that his current pain score is 9 out of 10.        Past Medical History:  Diagnosis Date  . AICD (automatic cardioverter/defibrillator) present   . Anxiety   . Arthritis   . Atherosclerosis of artery of extremity with ulceration ([place]) 09/2019   left foot s/p toe amp requiring debridement and futher toe amputations.  . Atrial fibrillation ([place])   . Cervical spinal stenosis    with neuropathy  . CHF (congestive heart failure) ([place])   . Constipation   . COPD (chronic obstructive pulmonary disease) ([place])   . Coronary artery disease   . Depression   . Dyspnea   . Dysrhythmia    atrial fibrillation  . Emphysema of lung ([place])   . GERD (gastroesophageal reflux disease)   . Hypertension   . Lung nodule seen on imaging study    being followed by dr. [name]. just watching it for last few years, without change  . Myocardial infarction [facility]) 2004   stent placed, pacemaker implanted 2005  . Oxygen dependent    requires 2L nasal prong oxygen 24 hours a day  . Peripheral vascular disease ([place])   . Presence of permanent cardiac pacemaker 2005,2018    Patient Active Problem List   Diagnosis Date Noted  . Leg swelling 03/26/2019  . Palliative care by specialist 02/16/2019    . Atherosclerosis of native arteries of the extremities with ulceration ([place]) 01/31/2019  . Postoperative seroma of subcutaneous tissue after non-dermatologic procedure 02/04/2018  . Current mild episode of major depressive disorder without prior episode ([place]) 12/14/2017  . Atherosclerosis of artery of extremity with rest pain ([place]) 08/11/2017  . COPD (chronic obstructive pulmonary disease) ([place]) 06/22/2017  . Cervical disc disease with myelopathy 05/28/2017  . COPD with acute exacerbation ([place]) 10/13/2016  . Acute respiratory failure with hypoxia and hypercapnia ([place]) 10/13/2016  . Chronic systolic CHF (congestive heart failure) ([place]) 10/13/2016  . AF (paroxysmal atrial fibrillation) ([place]) 10/13/2016  . Hx of AKA (above knee amputation), right ([place]) 05/15/2016  . Essential hypertension, benign 05/15/2016  . PVD (peripheral vascular disease) ([place]) 05/15/2016  . Atherosclerotic peripheral vascular disease with intermittent claudication ([place]) 08/01/2014  . Moderate tricuspid insufficiency 08/01/2014  . Mixed hyperlipidemia 07/23/2014  . AICD (automatic cardioverter/defibrillator) present 06/12/2011  . Factor V Leiden mutation ([place]) 06/12/2011  . Coronary artery disease 06/12/2011    Past Surgical History:  Procedure Laterality Date  . ABOVE KNEE LEG AMPUTATION Right    after below the knee amputation   . AMPUTATION TOE Left 08/04/2019   Procedure: AMPUTATION TOE MPJ LEFT;  Surgeon: Samara Deist, DPM;  Location: [facility] ORS;  Service: Podiatry;  Laterality: Left;  . AMPUTATION TOE Left 10/06/2019   Procedure: AMPUTATION TOE MPJ T1,T2 LEFT;  Surgeon: Samara Deist, DPM;  Location: [facility] ORS;  Service: Podiatry;  Laterality: Left;  . APPLICATION OF WOUND VAC Left 01/12/2018   Procedure: APPLICATION OF WOUND VAC;  Surgeon: Algernon Huxley, MD;  Location: [facility] ORS;  Service: Vascular;  Laterality: Left;  . BELOW KNEE LEG AMPUTATION Right   . CATARACT EXTRACTION, BILATERAL Bilateral   .  ENDARTERECTOMY FEMORAL Left 08/11/2017   Procedure: ENDARTERECTOMY FEMORAL;  Surgeon: Algernon Huxley, MD;  Location: [facility] ORS;  Service: Vascular;  Laterality: Left;  . HEMATOMA EVACUATION Left 01/12/2018   Procedure: EVACUATION HEMATOMA ( DRAINING OF SEROMA);  Surgeon: Algernon Huxley, MD;  Location: [facility] ORS;  Service: Vascular;  Laterality: Left;  . IMPLANTABLE CARDIOVERTER DEFIBRILLATOR (ICD) GENERATOR CHANGE Left 02/10/2017   Procedure: ICD GENERATOR CHANGE;  Surgeon: Isaias Cowman, MD;  Location: [facility] ORS;  Service: Cardiovascular;  Laterality: Left;  . INSERT / REPLACE / REMOVE PACEMAKER  [id number]  . LOWER EXTREMITY ANGIOGRAPHY Left 06/07/2017   Procedure: LOWER EXTREMITY ANGIOGRAPHY;  Surgeon: Algernon Huxley, MD;  Location: [place] [facility];  Service: Cardiovascular;  Laterality: Left;  . LOWER EXTREMITY ANGIOGRAPHY Left 10/05/2019   Procedure: LOWER EXTREMITY ANGIOGRAPHY;  Surgeon: Algernon Huxley, MD;  Location: [place] [facility];  Service: Cardiovascular;  Laterality: Left;  . LOWER EXTREMITY INTERVENTION  06/07/2017   Procedure: LOWER EXTREMITY INTERVENTION;  Surgeon: Algernon Huxley, MD;  Location: [place] [facility];  Service: Cardiovascular;;  . PERIPHERAL VASCULAR CATHETERIZATION Left 12/09/2015   Procedure: Lower Extremity Angiography;  Surgeon: Algernon Huxley, MD;  Location: [place] [facility];  Service: Cardiovascular;  Laterality: Left;  . PERIPHERAL VASCULAR CATHETERIZATION  12/09/2015   Procedure: Lower Extremity Intervention;  Surgeon: Algernon Huxley, MD;  Location: [place] [facility];  Service: Cardiovascular;;  . TONSILLECTOMY      Prior to Admission medications   Medication Sig Start Date End Date Taking? Authorizing Provider  acetaminophen (TYLENOL) 500 MG tablet Take 1,000 mg by mouth daily as needed for moderate pain or headache.    [provider]  ALPRAZolam Duanne Moron) 1 MG tablet Take 1 mg by mouth 2 (two) times daily as needed for anxiety or sleep.      [provider]  aspirin EC 81 MG tablet Take 1 tablet (81 mg total) by mouth daily. Patient taking differently: Take 81 mg by mouth daily after supper.  12/09/15   Algernon Huxley, MD  citalopram (CELEXA) 10 MG tablet Take 10 mg by mouth daily.    [provider]  docusate sodium (COLACE) 100 MG capsule Take 100 mg by mouth daily.     [provider]  ENTRESTO 24-26 MG Take 1 tablet by mouth 2 (two) times daily. 01/05/19   [provider]  fluticasone (FLONASE) 50 MCG/ACT nasal spray Place 2 sprays into both nostrils daily.    [provider]  fluticasone-salmeterol (ADVAIR HFA) 115-21 MCG/ACT inhaler USE 2 INHALATIONS ORALLY EVERY 12 HOURS Patient taking differently: Inhale 2 puffs into the lungs every 12 (twelve) hours.  05/30/18   Flora Lipps, MD  furosemide (LASIX) 20 MG tablet Take 20 mg by mouth daily.     [provider]  gabapentin (NEURONTIN) 100 MG capsule Take 300 mg by mouth in the morning, at noon, and at bedtime. 09/20/19   [provider]  guaiFENesin (MUCINEX) 600 MG 12 hr tablet Take 600 mg by mouth every evening.     [provider]  ipratropium (ATROVENT) 0.03 % nasal spray Place 2 sprays into both  nostrils 2 (two) times daily.     [provider]  Ipratropium-Albuterol (COMBIVENT RESPIMAT) 20-100 MCG/ACT AERS respimat Inhale 1 puff into the lungs every 4 (four) hours.     [provider]  levalbuterol (XOPENEX) 1.25 MG/3ML nebulizer solution Take 1.25 mg (3 mLs total) by nebulization every 4 (four) hours. Patient taking differently: Take 3 mLs by nebulization every 4 (four) hours as needed for wheezing or shortness of breath.  10/23/16   Theodoro Grist, MD  loratadine (CLARITIN) 10 MG tablet Take 10 mg by mouth at bedtime.    [provider]  lovastatin (MEVACOR) 20 MG tablet Take 20 mg by mouth at bedtime.    [provider]  metoprolol succinate (TOPROL-XL) 25 MG 24 hr tablet  Take 25 mg by mouth daily.    [provider]  nitroGLYCERIN (NITROSTAT) 0.4 MG SL tablet Place 0.4 mg under the tongue every 5 (five) minutes as needed for chest pain.    [provider]  oxyCODONE-acetaminophen (PERCOCET) 7.5-325 MG tablet Take 1 tablet by mouth every 6 (six) hours as needed for moderate pain.     [provider]  pantoprazole (PROTONIX) 40 MG tablet Take 40 mg by mouth daily before breakfast.     [provider]  Respiratory Therapy Supplies (FLUTTER) DEVI Use as directed. 05/15/19   Magdalen Spatz, NP  SPIRIVA RESPIMAT 1.25 MCG/ACT AERS INHALE 2 PUFFS BY MOUTH INTO THE LUNGS DAILY Patient taking differently: Inhale 2 puffs into the lungs daily.  05/29/19   Flora Lipps, MD  tamsulosin (FLOMAX) 0.4 MG CAPS capsule Take 0.4 mg by mouth daily after supper.  03/07/18   [provider]  vitamin B-12 (CYANOCOBALAMIN) 1000 MCG tablet Take 1,000 mcg by mouth daily.    [provider]  warfarin (COUMADIN) 5 MG tablet Take 5 mg by mouth at bedtime.     [provider]  Wound Dressings (RESTORE WOUND CARE DRESSING) PADS Apply 1 each topically daily.  01/25/18   [provider]    Allergies Apixaban and Rivaroxaban  Family History  Problem Relation Age of Onset  . Cancer Mother   . Cancer Father   . Heart disease Father     Social History Social History   Tobacco Use  . Smoking status: Former Smoker    Packs/day: 1.00    Years: 42.00    Pack years: 42.00    Types: Cigarettes    Quit date: 09/23/2013    Years since quitting: 6.0  . Smokeless tobacco: Never Used  Substance Use Topics  . Alcohol use: No  . Drug use: No    Review of Systems Constitutional: No fever/chills Eyes: No visual changes. ENT: No sore throat. Cardiovascular: Denies chest pain. Respiratory: Denies shortness of breath. Gastrointestinal: No abdominal pain.  No nausea, no vomiting.  No diarrhea.  No constipation. Genitourinary:  Negative for dysuria. Musculoskeletal: .  Positive for left foot pain swelling redness purulent drainage from surgical site. Integumentary: Negative for rash. Neurological: Negative for headaches, focal weakness or numbness.  ____________________________________________   PHYSICAL EXAM:  VITAL SIGNS: ED Triage Vitals  Enc Vitals Group     BP 10/17/19 1719 (!) 108/55     Pulse Rate 10/17/19 1714 77     Resp 10/17/19 1719 (!) 22     Temp 10/17/19 1714 98.5 F (36.9 C)     Temp src --      SpO2 10/17/19 1714 97 %     Weight  10/17/19 1715 74.8 kg (165 lb)     Height 10/17/19 1715 1.854 m (6\' 1" )     Head Circumference --      Peak Flow --      Pain Score 10/17/19 1715 8     Pain Loc --      Pain Edu? --      Excl. in [place]? --    Constitutional: Alert and oriented.  Eyes: Conjunctivae are normal.  Mouth/Throat: Patient is wearing a mask. Neck: No stridor.  No meningeal signs.   Cardiovascular: Normal rate, regular rhythm. Good peripheral circulation. Grossly normal heart sounds. Respiratory: Tachypnea, positive accessory respiratory muscle use, diffuse expiratory wheezing. Gastrointestinal: Soft and nontender. No distention.  Musculoskeletal: No lower extremity tenderness nor edema. No gross deformities of extremities. Neurologic:  Normal speech and language. No gross focal neurologic deficits are appreciated.  Skin: Blanching erythema extending to the thigh with apparent necrotic tissue at the surgical site, purulent drainage the site of toe amputation please refer to below picture.:    Speech and behavior are normal.**}  ____________________________________________   LABS (all labs ordered are listed, but only abnormal results are displayed)  Labs Reviewed  COMPREHENSIVE METABOLIC PANEL - Abnormal; Notable for the following components:      Result Value   Sodium 126 (*)    Chloride 88 (*)    Glucose, Bld 102 (*)    BUN 6 (*)    Albumin 3.3 (*)    All other  components within normal limits  CBC WITH DIFFERENTIAL/PLATELET - Abnormal; Notable for the following components:   RBC 3.47 (*)    Hemoglobin 10.0 (*)    HCT 30.4 (*)    Monocytes Absolute 1.2 (*)    All other components within normal limits  PROTIME-INR - Abnormal; Notable for the following components:   Prothrombin Time 24.5 (*)    INR 2.3 (*)    All other components within normal limits  CULTURE, BLOOD (ROUTINE X 2)  CULTURE, BLOOD (ROUTINE X 2)  LACTIC ACID, PLASMA  LACTIC ACID, PLASMA  URINALYSIS, COMPLETE (UACMP) WITH MICROSCOPIC    RADIOLOGY I, Pass Christian N Angelli Baruch, personally viewed and evaluated these images (plain radiographs) as part of my medical decision making, as well as reviewing the written report by the radiologist.  ED MD interpretation: No active cardiopulmonary disease noted on chest x-ray per radiologist.  Official radiology report(s): DG Chest 2 View  Result Date: 10/17/2019 CLINICAL DATA:  Suspected sepsis EXAM: CHEST - 2 VIEW COMPARISON:  06/12/2019 FINDINGS: Left-sided pacing device as before. No focal opacity, pleural effusion, or pneumothorax. Stable mild cardiomegaly with aortic atherosclerosis. No pneumothorax. Hyperinflation with emphysematous disease IMPRESSION: No active cardiopulmonary disease. Hyperinflation with emphysematous disease Electronically Signed   By: Donavan Foil M.D.   On: 10/17/2019 18:21   DG Foot Complete Left  Result Date: 10/17/2019 CLINICAL DATA:  Left foot pain. Technologist notes state gangrenous foot. Concern for osteomyelitis. Two toes amputated 10 days ago. EXAM: LEFT FOOT - COMPLETE 3+ VIEW COMPARISON:  None. FINDINGS: Resection of the first, second, and third digit. The first, second, and third metatarsal heads are eroded. There is adjacent soft tissue edema and mottled gas in the soft tissues. No evidence of osteomyelitis of the in situ fourth and fifth digit. There is generalized soft tissue edema. Advanced vascular  calcifications. IMPRESSION: 1. 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 2. Soft tissue edema with mottled gas in the soft tissues suspicious  for soft tissue infection. 3. Generalized soft tissue edema. Advanced vascular calcifications. Electronically Signed   By: Keith Rake M.D.   On: 10/17/2019 23:42    ____________________________________________   PROCEDURES     Procedures   ____________________________________________   INITIAL IMPRESSION / MDM / [place] / ED COURSE  As part of my medical decision making, I reviewed the following data within the electronic MEDICAL RECORD NUMBER  64 year old male presented with above-stated history and physical exam with differential diagnosis including but not limited to cellulitis of the left foot extending to the left thigh with associated gangrenous changes of the forefoot.  Also concern for possible osteomyelitis.  Patient given IV morphine 4 mg Zofran 4 mg.  Patient also given IV vancomycin and ceftriaxone.  Patient has history of COPD and had audible wheezes on auscultation and as such patient given 2 duo nebs.  Patient's x-ray consistent with osteomyelitis and soft tissue (gas producing) infection.  Patient discussed with Dr. Sidney Ace for hospital admission for further evaluation and management.  ____________________________________________  FINAL CLINICAL IMPRESSION(S) / ED DIAGNOSES  Final diagnoses:  Osteomyelitis of left leg ([place])     MEDICATIONS GIVEN DURING THIS VISIT:  Medications  HYDROmorphone (DILAUDID) injection 1 mg (has no administration in time range)  morphine 4 MG/ML injection 4 mg (4 mg Intravenous Given 10/17/19 2317)  ondansetron (ZOFRAN) injection 4 mg (4 mg Intravenous Given 10/17/19 2318)  vancomycin (VANCOCIN) IVPB 1000 mg/200 mL premix (0 mg Intravenous Stopped 10/18/19 0057)  cefTRIAXone (ROCEPHIN) 2 g in sodium chloride 0.9 % 100 mL IVPB (0 g Intravenous  Stopped 10/18/19 0057)  ipratropium-albuterol (DUONEB) 0.5-2.5 (3) MG/3ML nebulizer solution 3 mL (3 mLs Nebulization Given 10/17/19 2344)  ipratropium-albuterol (DUONEB) 0.5-2.5 (3) MG/3ML nebulizer solution 3 mL (3 mLs Nebulization Given 10/17/19 2344)     ED Discharge Orders    None      *Please note:  Tyler [name] was evaluated in Emergency Department on 10/18/2019 for the symptoms described in the history of present illness. He was evaluated in the context of the global COVID-19 pandemic, which necessitated consideration that the patient might be at risk for infection with the SARS-CoV-2 virus that causes COVID-19. Institutional protocols and algorithms that pertain to the evaluation of patients at risk for COVID-19 are in a state of rapid change based on information released by regulatory bodies including the CDC and federal and state organizations. These policies and algorithms were followed during the patient's care in the ED.  Some ED evaluations and interventions may be delayed as a result of limited staffing during the pandemic.*  Note:  This document was prepared using Dragon voice recognition software and may include unintentional dictation errors.   Gregor Hams, MD 10/18/19 0130

## 2019-10-17 NOTE — ED Triage Notes (Signed)
PT to ED from home. PT had 2 toes amputated may 15 and foot is now gangrenous and painful.

## 2019-10-18 ENCOUNTER — Telehealth: Payer: Self-pay | Admitting: Primary Care

## 2019-10-18 ENCOUNTER — Other Ambulatory Visit: Payer: Self-pay

## 2019-10-18 ENCOUNTER — Encounter: Payer: Self-pay | Admitting: Family Medicine

## 2019-10-18 DIAGNOSIS — Z89611 Acquired absence of right leg above knee: Secondary | ICD-10-CM | POA: Diagnosis not present

## 2019-10-18 DIAGNOSIS — G629 Polyneuropathy, unspecified: Secondary | ICD-10-CM | POA: Diagnosis present

## 2019-10-18 DIAGNOSIS — L03116 Cellulitis of left lower limb: Secondary | ICD-10-CM

## 2019-10-18 DIAGNOSIS — M869 Osteomyelitis, unspecified: Secondary | ICD-10-CM | POA: Diagnosis present

## 2019-10-18 DIAGNOSIS — I110 Hypertensive heart disease with heart failure: Secondary | ICD-10-CM | POA: Diagnosis present

## 2019-10-18 DIAGNOSIS — D638 Anemia in other chronic diseases classified elsewhere: Secondary | ICD-10-CM | POA: Diagnosis present

## 2019-10-18 DIAGNOSIS — M86072 Acute hematogenous osteomyelitis, left ankle and foot: Secondary | ICD-10-CM

## 2019-10-18 DIAGNOSIS — E871 Hypo-osmolality and hyponatremia: Secondary | ICD-10-CM | POA: Diagnosis present

## 2019-10-18 DIAGNOSIS — I5022 Chronic systolic (congestive) heart failure: Secondary | ICD-10-CM | POA: Diagnosis present

## 2019-10-18 DIAGNOSIS — Z955 Presence of coronary angioplasty implant and graft: Secondary | ICD-10-CM | POA: Diagnosis not present

## 2019-10-18 DIAGNOSIS — Z95810 Presence of automatic (implantable) cardiac defibrillator: Secondary | ICD-10-CM | POA: Diagnosis not present

## 2019-10-18 DIAGNOSIS — F329 Major depressive disorder, single episode, unspecified: Secondary | ICD-10-CM | POA: Diagnosis present

## 2019-10-18 DIAGNOSIS — I10 Essential (primary) hypertension: Secondary | ICD-10-CM | POA: Diagnosis not present

## 2019-10-18 DIAGNOSIS — Z20822 Contact with and (suspected) exposure to covid-19: Secondary | ICD-10-CM | POA: Diagnosis present

## 2019-10-18 DIAGNOSIS — Z9981 Dependence on supplemental oxygen: Secondary | ICD-10-CM | POA: Diagnosis not present

## 2019-10-18 DIAGNOSIS — M79672 Pain in left foot: Secondary | ICD-10-CM | POA: Diagnosis not present

## 2019-10-18 DIAGNOSIS — M4802 Spinal stenosis, cervical region: Secondary | ICD-10-CM | POA: Diagnosis present

## 2019-10-18 DIAGNOSIS — N400 Enlarged prostate without lower urinary tract symptoms: Secondary | ICD-10-CM | POA: Diagnosis present

## 2019-10-18 DIAGNOSIS — K219 Gastro-esophageal reflux disease without esophagitis: Secondary | ICD-10-CM | POA: Diagnosis present

## 2019-10-18 DIAGNOSIS — I2510 Atherosclerotic heart disease of native coronary artery without angina pectoris: Secondary | ICD-10-CM | POA: Diagnosis present

## 2019-10-18 DIAGNOSIS — J449 Chronic obstructive pulmonary disease, unspecified: Secondary | ICD-10-CM | POA: Diagnosis present

## 2019-10-18 DIAGNOSIS — I252 Old myocardial infarction: Secondary | ICD-10-CM | POA: Diagnosis not present

## 2019-10-18 DIAGNOSIS — I96 Gangrene, not elsewhere classified: Secondary | ICD-10-CM | POA: Diagnosis present

## 2019-10-18 DIAGNOSIS — M86172 Other acute osteomyelitis, left ankle and foot: Secondary | ICD-10-CM

## 2019-10-18 DIAGNOSIS — F419 Anxiety disorder, unspecified: Secondary | ICD-10-CM | POA: Diagnosis present

## 2019-10-18 DIAGNOSIS — D62 Acute posthemorrhagic anemia: Secondary | ICD-10-CM | POA: Diagnosis not present

## 2019-10-18 DIAGNOSIS — L02612 Cutaneous abscess of left foot: Secondary | ICD-10-CM | POA: Diagnosis present

## 2019-10-18 LAB — URINALYSIS, COMPLETE (UACMP) WITH MICROSCOPIC
Bacteria, UA: NONE SEEN
Bilirubin Urine: NEGATIVE
Glucose, UA: NEGATIVE mg/dL
Ketones, ur: NEGATIVE mg/dL
Leukocytes,Ua: NEGATIVE
Nitrite: NEGATIVE
Protein, ur: NEGATIVE mg/dL
Specific Gravity, Urine: 1.006 (ref 1.005–1.030)
Squamous Epithelial / HPF: NONE SEEN (ref 0–5)
pH: 6 (ref 5.0–8.0)

## 2019-10-18 LAB — CBC
HCT: 28.8 % — ABNORMAL LOW (ref 39.0–52.0)
Hemoglobin: 9.5 g/dL — ABNORMAL LOW (ref 13.0–17.0)
MCH: 29.4 pg (ref 26.0–34.0)
MCHC: 33 g/dL (ref 30.0–36.0)
MCV: 89.2 fL (ref 80.0–100.0)
Platelets: 375 10*3/uL (ref 150–400)
RBC: 3.23 MIL/uL — ABNORMAL LOW (ref 4.22–5.81)
RDW: 13 % (ref 11.5–15.5)
WBC: 10.3 10*3/uL (ref 4.0–10.5)
nRBC: 0 % (ref 0.0–0.2)

## 2019-10-18 LAB — BASIC METABOLIC PANEL
Anion gap: 9 (ref 5–15)
BUN: 6 mg/dL — ABNORMAL LOW (ref 8–23)
CO2: 29 mmol/L (ref 22–32)
Calcium: 8.7 mg/dL — ABNORMAL LOW (ref 8.9–10.3)
Chloride: 91 mmol/L — ABNORMAL LOW (ref 98–111)
Creatinine, Ser: 0.78 mg/dL (ref 0.61–1.24)
GFR calc Af Amer: >60 mL/min (ref >60)
GFR calc non Af Amer: >60 mL/min (ref >60)
Glucose, Bld: 87 mg/dL (ref 70–99)
Potassium: 4.1 mmol/L (ref 3.5–5.1)
Sodium: 129 mmol/L — ABNORMAL LOW (ref 135–145)

## 2019-10-18 LAB — SARS CORONAVIRUS 2 BY RT PCR (HOSPITAL ORDER, PERFORMED IN ~~LOC~~ HOSPITAL LAB): SARS Coronavirus 2: NEGATIVE

## 2019-10-18 LAB — HIV ANTIBODY (ROUTINE TESTING W REFLEX): HIV Screen 4th Generation wRfx: NONREACTIVE

## 2019-10-18 LAB — LACTIC ACID, PLASMA: Lactic Acid, Venous: 1.2 mmol/L (ref 0.5–1.9)

## 2019-10-18 MED ORDER — TRAZODONE HCL 50 MG PO TABS
25.0000 mg | ORAL_TABLET | Freq: Every evening | ORAL | Status: DC | PRN
  Filled 2019-10-18: qty 1

## 2019-10-18 MED ORDER — VANCOMYCIN HCL 1250 MG/250ML IV SOLN
1250.0000 mg | Freq: Two times a day (BID) | INTRAVENOUS | Status: DC
  Administered 2019-10-18 – 2019-10-19 (×2): 1250 mg via INTRAVENOUS
  Filled 2019-10-18 (×5): qty 250

## 2019-10-18 MED ORDER — CITALOPRAM HYDROBROMIDE 10 MG PO TABS
10.0000 mg | ORAL_TABLET | Freq: Every day | ORAL | Status: DC
  Administered 2019-10-18 – 2019-10-24 (×6): 10 mg via ORAL
  Filled 2019-10-18 (×7): qty 1

## 2019-10-18 MED ORDER — WARFARIN SODIUM 5 MG PO TABS: 5.0000 mg | ORAL_TABLET | Freq: Every day | ORAL | Status: DC

## 2019-10-18 MED ORDER — ONDANSETRON HCL 4 MG PO TABS: 4.0000 mg | ORAL_TABLET | Freq: Four times a day (QID) | ORAL | Status: DC | PRN

## 2019-10-18 MED ORDER — VITAMIN B-12 1000 MCG PO TABS
1000.0000 ug | ORAL_TABLET | Freq: Every day | ORAL | Status: DC
  Administered 2019-10-18 – 2019-10-24 (×6): 1000 ug via ORAL
  Filled 2019-10-18 (×7): qty 1

## 2019-10-18 MED ORDER — KETOROLAC TROMETHAMINE 15 MG/ML IJ SOLN
15.0000 mg | Freq: Four times a day (QID) | INTRAMUSCULAR | Status: AC | PRN
  Administered 2019-10-18 – 2019-10-22 (×12): 15 mg via INTRAVENOUS
  Filled 2019-10-18 (×13): qty 1

## 2019-10-18 MED ORDER — ALPRAZOLAM 0.5 MG PO TABS
1.0000 mg | ORAL_TABLET | Freq: Two times a day (BID) | ORAL | Status: DC | PRN
  Administered 2019-10-18 – 2019-10-24 (×11): 1 mg via ORAL
  Filled 2019-10-18 (×11): qty 2

## 2019-10-18 MED ORDER — GUAIFENESIN ER 600 MG PO TB12
600.0000 mg | ORAL_TABLET | Freq: Every evening | ORAL | Status: DC
  Administered 2019-10-18 – 2019-10-23 (×6): 600 mg via ORAL
  Filled 2019-10-18 (×6): qty 1

## 2019-10-18 MED ORDER — TIOTROPIUM BROMIDE MONOHYDRATE 18 MCG IN CAPS
18.0000 ug | ORAL_CAPSULE | Freq: Every day | RESPIRATORY_TRACT | Status: DC
  Administered 2019-10-18 – 2019-10-23 (×6): 18 ug via RESPIRATORY_TRACT
  Filled 2019-10-18 (×2): qty 5

## 2019-10-18 MED ORDER — FUROSEMIDE 20 MG PO TABS
20.0000 mg | ORAL_TABLET | Freq: Every day | ORAL | Status: DC
  Administered 2019-10-18 – 2019-10-24 (×6): 20 mg via ORAL
  Filled 2019-10-18 (×6): qty 1

## 2019-10-18 MED ORDER — ACETAMINOPHEN 325 MG PO TABS
650.0000 mg | ORAL_TABLET | Freq: Four times a day (QID) | ORAL | Status: DC | PRN
  Administered 2019-10-18: 650 mg via ORAL
  Filled 2019-10-18 (×2): qty 2

## 2019-10-18 MED ORDER — ACETAMINOPHEN 650 MG RE SUPP: 650.0000 mg | Freq: Four times a day (QID) | RECTAL | Status: DC | PRN

## 2019-10-18 MED ORDER — PIPERACILLIN-TAZOBACTAM 3.375 G IVPB 30 MIN
3.3750 g | Freq: Once | INTRAVENOUS | Status: AC
  Administered 2019-10-18: 3.375 g via INTRAVENOUS
  Filled 2019-10-18: qty 50

## 2019-10-18 MED ORDER — DOCUSATE SODIUM 100 MG PO CAPS
100.0000 mg | ORAL_CAPSULE | Freq: Every day | ORAL | Status: DC
  Administered 2019-10-18 – 2019-10-24 (×6): 100 mg via ORAL
  Filled 2019-10-18 (×6): qty 1

## 2019-10-18 MED ORDER — MORPHINE SULFATE (PF) 2 MG/ML IV SOLN
2.0000 mg | INTRAVENOUS | Status: DC | PRN
  Administered 2019-10-18 – 2019-10-19 (×6): 2 mg via INTRAVENOUS
  Filled 2019-10-18 (×6): qty 1

## 2019-10-18 MED ORDER — PRAVASTATIN SODIUM 20 MG PO TABS
20.0000 mg | ORAL_TABLET | Freq: Every day | ORAL | Status: DC
  Administered 2019-10-18 – 2019-10-23 (×6): 20 mg via ORAL
  Filled 2019-10-18 (×6): qty 1

## 2019-10-18 MED ORDER — GABAPENTIN 300 MG PO CAPS
300.0000 mg | ORAL_CAPSULE | Freq: Two times a day (BID) | ORAL | Status: DC
  Administered 2019-10-18 – 2019-10-24 (×13): 300 mg via ORAL
  Filled 2019-10-18 (×13): qty 1

## 2019-10-18 MED ORDER — METOPROLOL SUCCINATE ER 25 MG PO TB24
25.0000 mg | ORAL_TABLET | Freq: Every day | ORAL | Status: DC
  Administered 2019-10-18 – 2019-10-24 (×5): 25 mg via ORAL
  Filled 2019-10-18 (×6): qty 1

## 2019-10-18 MED ORDER — RESTORE WOUND CARE DRESSING EX PADS
1.0000 | MEDICATED_PAD | Freq: Every day | CUTANEOUS | Status: DC
  Filled 2019-10-18 (×7): qty 1

## 2019-10-18 MED ORDER — ENOXAPARIN SODIUM 40 MG/0.4ML ~~LOC~~ SOLN: 40.0000 mg | SUBCUTANEOUS | Status: DC

## 2019-10-18 MED ORDER — KETOROLAC TROMETHAMINE 30 MG/ML IJ SOLN
INTRAMUSCULAR | Status: AC
  Filled 2019-10-18: qty 1

## 2019-10-18 MED ORDER — PIPERACILLIN-TAZOBACTAM 3.375 G IVPB
3.3750 g | Freq: Three times a day (TID) | INTRAVENOUS | Status: DC
  Administered 2019-10-18 – 2019-10-23 (×15): 3.375 g via INTRAVENOUS
  Filled 2019-10-18 (×15): qty 50

## 2019-10-18 MED ORDER — FLUTICASONE PROPIONATE 50 MCG/ACT NA SUSP
2.0000 | Freq: Every day | NASAL | Status: DC
  Administered 2019-10-18 – 2019-10-24 (×6): 2 via NASAL
  Filled 2019-10-18: qty 16

## 2019-10-18 MED ORDER — NITROGLYCERIN 0.4 MG SL SUBL: 0.4000 mg | SUBLINGUAL_TABLET | SUBLINGUAL | Status: DC | PRN

## 2019-10-18 MED ORDER — TIOTROPIUM BROMIDE MONOHYDRATE 1.25 MCG/ACT IN AERS: 2.0000 | INHALATION_SPRAY | Freq: Every day | RESPIRATORY_TRACT | Status: DC

## 2019-10-18 MED ORDER — IPRATROPIUM BROMIDE 0.03 % NA SOLN
2.0000 | Freq: Two times a day (BID) | NASAL | Status: DC
  Administered 2019-10-18 – 2019-10-24 (×10): 2 via NASAL
  Filled 2019-10-18: qty 30

## 2019-10-18 MED ORDER — SODIUM CHLORIDE 0.9 % IV SOLN
INTRAVENOUS | Status: DC
  Administered 2019-10-20: 100 mL/h via INTRAVENOUS

## 2019-10-18 MED ORDER — PANTOPRAZOLE SODIUM 40 MG PO TBEC
40.0000 mg | DELAYED_RELEASE_TABLET | Freq: Every day | ORAL | Status: DC
  Administered 2019-10-18 – 2019-10-24 (×6): 40 mg via ORAL
  Filled 2019-10-18 (×7): qty 1

## 2019-10-18 MED ORDER — KETOROLAC TROMETHAMINE 30 MG/ML IJ SOLN
INTRAMUSCULAR | Status: AC
  Administered 2019-10-18: 15 mg
  Filled 2019-10-18: qty 1

## 2019-10-18 MED ORDER — SACUBITRIL-VALSARTAN 24-26 MG PO TABS
1.0000 | ORAL_TABLET | Freq: Two times a day (BID) | ORAL | Status: DC
  Administered 2019-10-18 – 2019-10-24 (×10): 1 via ORAL
  Filled 2019-10-18 (×15): qty 1

## 2019-10-18 MED ORDER — ONDANSETRON HCL 4 MG/2ML IJ SOLN: 4.0000 mg | Freq: Four times a day (QID) | INTRAMUSCULAR | Status: DC | PRN

## 2019-10-18 MED ORDER — HYDROMORPHONE HCL 1 MG/ML IJ SOLN
1.0000 mg | Freq: Once | INTRAMUSCULAR | Status: AC
  Administered 2019-10-18: 1 mg via INTRAVENOUS

## 2019-10-18 MED ORDER — MAGNESIUM HYDROXIDE 400 MG/5ML PO SUSP: 30.0000 mL | Freq: Every day | ORAL | Status: DC | PRN

## 2019-10-18 MED ORDER — MOMETASONE FURO-FORMOTEROL FUM 200-5 MCG/ACT IN AERO
2.0000 | INHALATION_SPRAY | Freq: Two times a day (BID) | RESPIRATORY_TRACT | Status: DC
  Administered 2019-10-18 – 2019-10-23 (×10): 2 via RESPIRATORY_TRACT
  Filled 2019-10-18: qty 8.8

## 2019-10-18 MED ORDER — LEVALBUTEROL HCL 1.25 MG/0.5ML IN NEBU
1.2500 mg | INHALATION_SOLUTION | RESPIRATORY_TRACT | Status: DC | PRN
  Administered 2019-10-18 – 2019-10-23 (×9): 1.25 mg via RESPIRATORY_TRACT
  Filled 2019-10-18 (×9): qty 0.5

## 2019-10-18 MED ORDER — HYDROMORPHONE HCL 1 MG/ML IJ SOLN
INTRAMUSCULAR | Status: AC
  Filled 2019-10-18: qty 1

## 2019-10-18 MED ORDER — TAMSULOSIN HCL 0.4 MG PO CAPS
0.4000 mg | ORAL_CAPSULE | Freq: Every day | ORAL | Status: DC
  Administered 2019-10-18 – 2019-10-23 (×6): 0.4 mg via ORAL
  Filled 2019-10-18 (×6): qty 1

## 2019-10-18 MED ORDER — OXYCODONE-ACETAMINOPHEN 7.5-325 MG PO TABS
1.0000 | ORAL_TABLET | Freq: Four times a day (QID) | ORAL | Status: DC | PRN
  Administered 2019-10-18 – 2019-10-20 (×7): 1 via ORAL
  Filled 2019-10-18 (×7): qty 1

## 2019-10-18 MED ORDER — ASPIRIN EC 81 MG PO TBEC
81.0000 mg | DELAYED_RELEASE_TABLET | Freq: Every day | ORAL | Status: DC
  Administered 2019-10-18 – 2019-10-23 (×5): 81 mg via ORAL
  Filled 2019-10-18 (×6): qty 1

## 2019-10-18 MED ORDER — IPRATROPIUM-ALBUTEROL 20-100 MCG/ACT IN AERS
1.0000 | INHALATION_SPRAY | RESPIRATORY_TRACT | Status: DC
  Administered 2019-10-18 – 2019-10-23 (×28): 1 via RESPIRATORY_TRACT
  Filled 2019-10-18: qty 4

## 2019-10-18 MED ORDER — LORATADINE 10 MG PO TABS
10.0000 mg | ORAL_TABLET | Freq: Every day | ORAL | Status: DC
  Administered 2019-10-18 – 2019-10-23 (×6): 10 mg via ORAL
  Filled 2019-10-18 (×6): qty 1

## 2019-10-18 MED ORDER — VANCOMYCIN HCL IN DEXTROSE 1-5 GM/200ML-% IV SOLN
1000.0000 mg | Freq: Once | INTRAVENOUS | Status: AC
  Administered 2019-10-18: 1000 mg via INTRAVENOUS
  Filled 2019-10-18: qty 200

## 2019-10-18 NOTE — H&P (View-Only) (Signed)
Reason for Consult: Gangrene with osteomyelitis left foot. Referring Physician: [name] is an 64 y.o. male.  HPI: This is a 64 year old male with a history of significant peripheral vascular disease having recently undergone amputation of multiple toes on the left foot.  Patient seen in the office yesterday with significant increase in infection and gangrenous changes to the forefoot and decision was made for admission for an attempted transmetatarsal amputation as a limb salvage.  Past Medical History:  Diagnosis Date  . AICD (automatic cardioverter/defibrillator) present   . Anxiety   . Arthritis   . Atherosclerosis of artery of extremity with ulceration ([place]) 09/2019   left foot s/p toe amp requiring debridement and futher toe amputations.  . Atrial fibrillation ([place])   . Cervical spinal stenosis    with neuropathy  . CHF (congestive heart failure) ([place])   . Constipation   . COPD (chronic obstructive pulmonary disease) ([place])   . Coronary artery disease   . Depression   . Dyspnea   . Dysrhythmia    atrial fibrillation  . Emphysema of lung ([place])   . GERD (gastroesophageal reflux disease)   . Hypertension   . Lung nodule seen on imaging study    being followed by dr. [name]. just watching it for last few years, without change  . Myocardial infarction [facility]) 2004   stent placed, pacemaker implanted 2005  . Oxygen dependent    requires 2L nasal prong oxygen 24 hours a day  . Peripheral vascular disease ([place])   . Presence of permanent cardiac pacemaker [phone]    Past Surgical History:  Procedure Laterality Date  . ABOVE KNEE LEG AMPUTATION Right    after below the knee amputation   . AMPUTATION TOE Left 08/04/2019   Procedure: AMPUTATION TOE MPJ LEFT;  Surgeon: Samara Deist, DPM;  Location: [facility] OR[facility];  Service: Podiatry;  Laterality: Left;  . AMPUTATION TOE Left 10/06/2019   Procedure: AMPUTATION TOE MPJ T1,T2 LEFT;  Surgeon: Samara Deist, DPM;  Location:  [facility];  Service: Podiatry;  Laterality: Left;  . APPLICATION OF WOUND VAC Left 01/12/2018   Procedure: APPLICATION OF WOUND VAC;  Surgeon: Algernon Huxley, MD;  Location: [facility];  Service: Vascular;  Laterality: Left;  . BELOW KNEE LEG AMPUTATION Right   . CATARACT EXTRACTION, BILATERAL Bilateral   . ENDARTERECTOMY FEMORAL Left 08/11/2017   Procedure: ENDARTERECTOMY FEMORAL;  Surgeon: Algernon Huxley, MD;  Location: [facility];  Service: Vascular;  Laterality: Left;  . HEMATOMA EVACUATION Left 01/12/2018   Procedure: EVACUATION HEMATOMA ( DRAINING OF SEROMA);  Surgeon: Algernon Huxley, MD;  Location: [facility];  Service: Vascular;  Laterality: Left;  . IMPLANTABLE CARDIOVERTER DEFIBRILLATOR (ICD) GENERATOR CHANGE Left 02/10/2017   Procedure: ICD GENERATOR CHANGE;  Surgeon: Isaias Cowman, MD;  Location: [facility];  Service: Cardiovascular;  Laterality: Left;  . INSERT / REPLACE / REMOVE PACEMAKER  [id number]  . LOWER EXTREMITY ANGIOGRAPHY Left 06/07/2017   Procedure: LOWER EXTREMITY ANGIOGRAPHY;  Surgeon: Algernon Huxley, MD;  Location: [place] [facility];  Service: Cardiovascular;  Laterality: Left;  . LOWER EXTREMITY ANGIOGRAPHY Left 10/05/2019   Procedure: LOWER EXTREMITY ANGIOGRAPHY;  Surgeon: Algernon Huxley, MD;  Location: [place] [facility];  Service: Cardiovascular;  Laterality: Left;  . LOWER EXTREMITY INTERVENTION  06/07/2017   Procedure: LOWER EXTREMITY INTERVENTION;  Surgeon: Algernon Huxley, MD;  Location: [place] [facility];  Service: Cardiovascular;;  . PERIPHERAL VASCULAR CATHETERIZATION Left 12/09/2015   Procedure:  Lower Extremity Angiography;  Surgeon: Algernon Huxley, MD;  Location: [place] [facility];  Service: Cardiovascular;  Laterality: Left;  . PERIPHERAL VASCULAR CATHETERIZATION  12/09/2015   Procedure: Lower Extremity Intervention;  Surgeon: Algernon Huxley, MD;  Location: [place] [facility];  Service: Cardiovascular;;  . TONSILLECTOMY      Family History  Problem Relation Age of  Onset  . Cancer Mother   . Cancer Father   . Heart disease Father     Social History:  reports that he quit smoking about 6 years ago. His smoking use included cigarettes. He has a 42.00 pack-year smoking history. He has never used smokeless tobacco. He reports that he does not drink alcohol or use drugs.  Allergies:  Allergies  Allergen Reactions  . Apixaban Rash  . Rivaroxaban Rash    Medications:  Scheduled: . aspirin EC  81 mg Oral QPC supper  . citalopram  10 mg Oral Daily  . docusate sodium  100 mg Oral Daily  . fluticasone  2 spray Each Nare Daily  . furosemide  20 mg Oral Daily  . gabapentin  300 mg Oral BID  . guaiFENesin  600 mg Oral QPM  . ipratropium  2 spray Each Nare BID  . Ipratropium-Albuterol  1 puff Inhalation Q4H  . loratadine  10 mg Oral QHS  . metoprolol succinate  25 mg Oral Daily  . mometasone-formoterol  2 puff Inhalation BID  . pantoprazole  40 mg Oral QAC breakfast  . pravastatin  20 mg Oral q1800  . Restore Wound Care Dressing  1 each Topical Daily  . sacubitril-valsartan  1 tablet Oral BID  . tamsulosin  0.4 mg Oral QPC supper  . tiotropium  18 mcg Inhalation Daily  . vitamin B-12  1,000 mcg Oral Daily    Results for orders placed or performed during the hospital encounter of 10/17/19 (from the past 48 hour(s))  Comprehensive metabolic panel     Status: Abnormal   Collection Time: 10/17/19  5:20 PM  Result Value Ref Range   Sodium 126 (L) 135 - 145 mmol/L   Potassium 4.7 3.5 - 5.1 mmol/L   Chloride 88 (L) 98 - 111 mmol/L   CO2 29 22 - 32 mmol/L   Glucose, Bld 102 (H) 70 - 99 mg/dL    Comment: Glucose reference range applies only to samples taken after fasting for at least 8 hours.   BUN 6 (L) 8 - 23 mg/dL   Creatinine, Ser 0.71 0.61 - 1.24 mg/dL   Calcium 8.9 8.9 - 10.3 mg/dL   Total Protein 6.7 6.5 - 8.1 g/dL   Albumin 3.3 (L) 3.5 - 5.0 g/dL   AST 23 15 - 41 U/L   ALT 14 0 - 44 U/L   Alkaline Phosphatase 69 38 - 126 U/L   Total  Bilirubin 0.5 0.3 - 1.2 mg/dL   GFR calc non Af Amer >60 >60 mL/min   GFR calc Af Amer >60 >60 mL/min   Anion gap 9 5 - 15    Comment: Performed at [facility], [address] [place], [place] [address]  Lactic acid, plasma     Status: None   Collection Time: 10/17/19  5:20 PM  Result Value Ref Range   Lactic Acid, Venous 0.9 0.5 - 1.9 mmol/L    Comment: Performed at [facility], [address] [place], [place] [address]  CBC with Differential     Status: Abnormal   Collection  Time: 10/17/19  5:20 PM  Result Value Ref Range   WBC 10.2 4.0 - 10.5 K/uL   RBC 3.47 (L) 4.22 - 5.81 MIL/uL   Hemoglobin 10.0 (L) 13.0 - 17.0 g/dL   HCT 30.4 (L) 39.0 - 52.0 %   MCV 87.6 80.0 - 100.0 fL   MCH 28.8 26.0 - 34.0 pg   MCHC 32.9 30.0 - 36.0 g/dL   RDW 13.0 11.5 - 15.5 %   Platelets 362 150 - 400 K/uL   nRBC 0.0 0.0 - 0.2 %   Neutrophils Relative % 76 %   Neutro Abs 7.6 1.7 - 7.7 K/uL   Lymphocytes Relative 10 %   Lymphs Abs 1.1 0.7 - 4.0 K/uL   Monocytes Relative 12 %   Monocytes Absolute 1.2 (H) 0.1 - 1.0 K/uL   Eosinophils Relative 2 %   Eosinophils Absolute 0.2 0.0 - 0.5 K/uL   Basophils Relative 0 %   Basophils Absolute 0.0 0.0 - 0.1 K/uL   Immature Granulocytes 0 %   Abs Immature Granulocytes 0.04 0.00 - 0.07 K/uL    Comment: Performed at [facility], [address] [place], [place] [address]  Protime-INR     Status: Abnormal   Collection Time: 10/17/19  5:20 PM  Result Value Ref Range   Prothrombin Time 24.5 (H) 11.4 - 15.2 seconds   INR 2.3 (H) 0.8 - 1.2    Comment: (NOTE) INR goal varies based on device and disease states. Performed at [facility], [address] [place], [place] [address]   Culture, blood (Routine x 2)     Status: None (Preliminary result)   Collection Time: 10/17/19  5:20 PM   Specimen: BLOOD  Result Value Ref Range   Specimen Description BLOOD RIGHT ANTECUBITAL    Special Requests      BOTTLES DRAWN  AEROBIC AND ANAEROBIC Blood Culture adequate volume   Culture      NO GROWTH < 24 HOURS Performed at [facility], [address], [place], [place] [address]    Report Status PENDING   Culture, blood (Routine x 2)     Status: None (Preliminary result)   Collection Time: 10/17/19  5:25 PM   Specimen: BLOOD LEFT HAND  Result Value Ref Range   Specimen Description BLOOD LEFT HAND    Special Requests      BOTTLES DRAWN AEROBIC AND ANAEROBIC Blood Culture adequate volume   Culture      NO GROWTH < 12 HOURS Performed at [facility], [address] [place], [place] [address]    Report Status PENDING   Lactic acid, plasma     Status: None   Collection Time: 10/17/19  7:20 PM  Result Value Ref Range   Lactic Acid, Venous 1.2 0.5 - 1.9 mmol/L    Comment: Performed at [facility], [address] [place], [place] [address]  Urinalysis, Complete w Microscopic     Status: Abnormal   Collection Time: 10/18/19  6:30 AM  Result Value Ref Range   Color, Urine YELLOW (A) YELLOW   APPearance CLEAR (A) CLEAR   Specific Gravity, Urine 1.006 1.005 - 1.030   pH 6.0 5.0 - 8.0   Glucose, UA NEGATIVE NEGATIVE mg/dL   Hgb urine dipstick SMALL (A) NEGATIVE   Bilirubin Urine NEGATIVE NEGATIVE   Ketones, ur NEGATIVE NEGATIVE mg/dL   Protein, ur NEGATIVE NEGATIVE mg/dL   Nitrite NEGATIVE NEGATIVE   Leukocytes,Ua NEGATIVE NEGATIVE   RBC /  HPF 0-5 0 - 5 RBC/hpf   WBC, UA 0-5 0 - 5 WBC/hpf   Bacteria, UA NONE SEEN NONE SEEN   Squamous Epithelial / LPF NONE SEEN 0 - 5    Comment: Performed at [facility], [address] [place], [place] [address]  HIV Antibody (routine testing w rflx)     Status: None   Collection Time: 10/18/19  6:32 AM  Result Value Ref Range   HIV Screen 4th Generation wRfx Non Reactive Non Reactive    Comment: Performed at [facility], [place] [address], [place], [place] [id number]  Basic metabolic panel     Status: Abnormal    Collection Time: 10/18/19  6:32 AM  Result Value Ref Range   Sodium 129 (L) 135 - 145 mmol/L   Potassium 4.1 3.5 - 5.1 mmol/L   Chloride 91 (L) 98 - 111 mmol/L   CO2 29 22 - 32 mmol/L   Glucose, Bld 87 70 - 99 mg/dL    Comment: Glucose reference range applies only to samples taken after fasting for at least 8 hours.   BUN 6 (L) 8 - 23 mg/dL   Creatinine, Ser 0.78 0.61 - 1.24 mg/dL   Calcium 8.7 (L) 8.9 - 10.3 mg/dL   GFR calc non Af Amer >60 >60 mL/min   GFR calc Af Amer >60 >60 mL/min   Anion gap 9 5 - 15    Comment: Performed at [facility], [address] [place], [place] [address]  CBC     Status: Abnormal   Collection Time: 10/18/19  6:32 AM  Result Value Ref Range   WBC 10.3 4.0 - 10.5 K/uL   RBC 3.23 (L) 4.22 - 5.81 MIL/uL   Hemoglobin 9.5 (L) 13.0 - 17.0 g/dL   HCT 28.8 (L) 39.0 - 52.0 %   MCV 89.2 80.0 - 100.0 fL   MCH 29.4 26.0 - 34.0 pg   MCHC 33.0 30.0 - 36.0 g/dL   RDW 13.0 11.5 - 15.5 %   Platelets 375 150 - 400 K/uL   nRBC 0.0 0.0 - 0.2 %    Comment: Performed at [facility], [address] [place], [place] [address]    DG Chest 2 View  Result Date: 10/17/2019 CLINICAL DATA:  Suspected sepsis EXAM: CHEST - 2 VIEW COMPARISON:  06/12/2019 FINDINGS: Left-sided pacing device as before. No focal opacity, pleural effusion, or pneumothorax. Stable mild cardiomegaly with aortic atherosclerosis. No pneumothorax. Hyperinflation with emphysematous disease IMPRESSION: No active cardiopulmonary disease. Hyperinflation with emphysematous disease Electronically Signed   By: Donavan Foil M.D.   On: 10/17/2019 18:21   US Venous Img Lower Unilateral Left  Addendum Date: 10/18/2019   ADDENDUM REPORT: 10/18/2019 01:53 ADDENDUM: These results were called by telephone at the time of interpretation on 10/18/2019 at 1:53 am to provider [facility] , who verbally acknowledged these results. Electronically Signed   By: Lovena Le M.D.   On: 10/18/2019 01:53    Result Date: 10/18/2019 CLINICAL DATA:  Pain and swelling the left leg, history of CHF EXAM: LEFT LOWER EXTREMITY VENOUS DOPPLER ULTRASOUND TECHNIQUE: Gray-scale sonography with compression, as well as color and duplex ultrasound, were performed to evaluate the deep venous system(s) from the level of the common femoral vein through the popliteal and proximal calf veins. COMPARISON:  None. FINDINGS: VENOUS There is hypoechoic incompletely compressible, nonocclusive, peripherally marginated filling defects likely reflecting thrombus in the left common femoral, femoral and popliteal vein as well  as the posterior tibial and peroneal veins of the calf. Normal direction of color venous flow. Normal preservation of the respiratory phasicity. Limited views of the contralateral common femoral vein are unremarkable. OTHER Likely surgical absence of the left greater saphenous vein. Limitations: none IMPRESSION: Incompletely compressible, nonocclusive thrombus seen throughout the deep venous system of the left lower extremity. A peripherally marginated hypoechoic appearance may suggest chronicity though acute thrombus is not excluded. Surgical absence of left greater saphenous vein. Currently attempting to contact the ordering provider with a critical value result. Addendum will be submitted upon case discussion. Chest Electronically Signed: By: Lovena Le M.D. On: 10/18/2019 01:46   DG Foot Complete Left  Result Date: 10/17/2019 CLINICAL DATA:  Left foot pain. Technologist notes state gangrenous foot. Concern for osteomyelitis. Two toes amputated 10 days ago. EXAM: LEFT FOOT - COMPLETE 3+ VIEW COMPARISON:  None. FINDINGS: Resection of the first, second, and third digit. The first, second, and third metatarsal heads are eroded. There is adjacent soft tissue edema and mottled gas in the soft tissues. No evidence of osteomyelitis of the in situ fourth and fifth digit. There is generalized soft tissue edema. Advanced  vascular calcifications. IMPRESSION: 1. 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 2. Soft tissue edema with mottled gas in the soft tissues suspicious for soft tissue infection. 3. Generalized soft tissue edema. Advanced vascular calcifications. Electronically Signed   By: Keith Rake M.D.   On: 10/17/2019 23:42    Review of Systems  Constitutional: Negative for chills and fever.  HENT: Negative for congestion and sore throat.   Respiratory: Negative for cough and shortness of breath.   Cardiovascular: Negative for chest pain and palpitations.  Gastrointestinal: Negative for nausea and vomiting.  Endocrine: Negative for polydipsia and polyuria.  Genitourinary: Negative for dysuria and hematuria.  Musculoskeletal: Negative for myalgias.       Previous above-knee amputation on the right lower extremity.  Recent increase in pain in the left foot.  Skin:       Drainage from the amputation sites on the left foot with some increased redness and swelling.  Neurological: Negative for dizziness and numbness.  Psychiatric/Behavioral: Negative for behavioral problems. The patient is not nervous/anxious.    Blood pressure (!) 99/51, pulse 74, temperature 98.1 F (36.7 C), temperature source Oral, resp. rate 15, height 6\' 1"  (1.854 m), weight 74.8 kg, SpO2 100 %. Physical Exam  Cardiovascular:  DP and PT pulses are palpable on the left foot.  Musculoskeletal:     Comments: Previous above-knee amputation on the right.  Previous amputation toes 1 2 and 3 on the left foot.  Neurological:  Protective threshold with a monofilament wire intact and symmetric on the left foot.  Skin:  Significant erythema and edema in the left foot.  Necrosis noted along the incision line at the amputation site of toes 1 through 3 with some dorsal necrosis as well.  Some moderate purulent drainage noted with some greenish discoloration consistent with Pseudomonas.     Assessment/Plan: Assessment: 1.  Gangrene with osteomyelitis left forefoot. 2.  Severe peripheral vascular disease. 3.  Cellulitis with abscess left foot.  Plan: Multiple sutures from the left foot were removed to allow for drainage.  A deep culture was obtained for sensitivities.  The open areas of the wound on the left foot were packed with gauze and covered with bulky dressing.  Discussed with the patient the need for transmetatarsal amputation of the left  foot as previously discussed by Dr. Vickki Muff as an attempt for limb salvage.  Discussed with the patient risks and complications of the procedure mostly involving the extent of infection and that there is a high likelihood that he will need a higher amputation.  At this point we will plan for surgery on Friday morning, potentially moving this up if needed depending how he is doing clinically.  Tyler [name] 10/18/2019, 1:34 PM

## 2019-10-18 NOTE — ED Notes (Signed)
Asked md for more pain medication for pt

## 2019-10-18 NOTE — Progress Notes (Signed)
Pharmacy Antibiotic Note  Tyler [name] is a 64 y.o. male admitted on 10/17/2019 with cellulitis.  Pharmacy has been consulted for Vancomycin and Zosyn dosing.  Plan: Zosyn 3.375g IV q8h (4 hour infusion).   Vancomycin 1250 mg IV Q 12 hrs. Goal AUC 400-550. Expected AUC: 505.1, Css min 13.2 SCr used: 0.8   Height: 6\' 1"  (185.4 cm) Weight: 74.8 kg (165 lb) IBW/kg (Calculated) : 79.9  Temp (24hrs), Avg:98.5 F (36.9 C), Min:98.5 F (36.9 C), Max:98.5 F (36.9 C)  Recent Labs  Lab 10/17/19 1720 10/17/19 1920  WBC 10.2  --   CREATININE 0.71  --   LATICACIDVEN 0.9 1.2    Estimated Creatinine Clearance: 98.7 mL/min (by C-G formula based on SCr of 0.71 mg/dL).    Allergies  Allergen Reactions  . Apixaban Rash  . Rivaroxaban Rash    Antimicrobials this admission:   >>    >>   Dose adjustments this admission:   Microbiology results:  BCx:   UCx:    Sputum:    MRSA PCR:   Thank you for allowing pharmacy to be a part of this patient's care.  Hart Robinsons A 10/18/2019 3:56 AM

## 2019-10-18 NOTE — ED Notes (Signed)
Admitting MD at bedside.

## 2019-10-18 NOTE — Progress Notes (Signed)
PROGRESS NOTE    Tyler [name]  [id number] DOB: [birth year] DOA: 10/17/2019 PCP: [name], MD   Brief Narrative:  Tyler [name]  is a 64 y.o. Caucasian male with a known history of CHF, COPD, coronary disease, atrial fibrillation, hypertension and peripheral vascular disease, status post AICD, presented to emergency room with acute onset of worsening left foot swelling with erythema and discharge.  Patient had his left big toe amputated on 3/15 by Dr. Vickki Muff and later on had his second and third toes amputated on 5/14.  His erythema has been extending to his left flank.  No fever or chills, nausea or vomiting or abdominal pain.  No chest pain or palpitations.  No dysuria, oliguria or hematuria or flank pain. Started on broad-spectrum antibiotics and podiatry was consulted.  They are planning to do transmetatarsal amputation.  Subjective: Patient continued to experience some pain in the left foot.  Per patient he was doing well after amputation, for the past 3 days he was noticing worsening of his edema associated with erythema and discharge.  He saw Dr. Vickki Muff yesterday and he advised him to go to ED for a possible metatarsal amputation.  Assessment & Plan:   Active Problems:   Osteomyelitis of left foot (HCC)  Left lower extremity severe nonpurulent cellulitis with associated left foot osteomyelitis and gangrene with purulent cellulitis. Podiatry is following-going for TMA on Friday. -Continue vancomycin and Zosyn.  Hypertension. -We will continue Entresto to.   Anxiety and depression. -We will continue Celexa and Xanax.    Peripheral neuropathy. -We will continue Neurontin.    BPH. -We will continue Flomax.   COPD without current exacerbation. -We will continue his Advair Diskus and Atrovent.   Objective: Vitals:   10/18/19 1200 10/18/19 1300 10/18/19 1630 10/18/19 1730  BP: (!) 99/51 (!) 123/55 (!) 103/56 (!) 101/58  Pulse: 74 75 77 74  Resp: 15 (!)  22 (!) 23 (!) 22  Temp: 98.1 F (36.7 C)     TempSrc: Oral     SpO2: 100% 100% 100% 100%  Weight:      Height:       No intake or output data in the 24 hours ending 10/18/19 1810 Filed Weights   10/17/19 1715  Weight: 74.8 kg    Examination:  General exam: Appears calm and comfortable  Respiratory system: Clear to auscultation. Respiratory effort normal. Cardiovascular system: S1 & S2 heard, RRR. No JVD, murmurs, rubs, gallops or clicks. Gastrointestinal system: Soft, nontender, nondistended, bowel sounds positive. Central nervous system: Alert and oriented. No focal neurological deficits.Symmetric 5 x 5 power. Extremities: Right AKA, left foot with 3 digit amputation, marked erythema and edema with wound dehiscence and purulent discharge associated with some gangrene and black eschar. Skin: No rashes, lesions or ulcers Psychiatry: Judgement and insight appear normal. Mood & affect appropriate.       DVT prophylaxis: SCDs Code Status: Full Family Communication: Discussed with patient Disposition Plan:  Status is: Inpatient   Remains inpatient appropriate because:Inpatient level of care appropriate due to severity of illness   Dispo: The patient is from: Home              Anticipated d/c is to: Home              Anticipated d/c date is: 3 days              Patient currently is not medically stable to d/c.   Consultants:   Podiatry  Procedures:  Antimicrobials: Vancomycin Zosyn  Data Reviewed: I have personally reviewed following labs and imaging studies  CBC: Recent Labs  Lab 10/17/19 1720 10/18/19 0632  WBC 10.2 10.3  NEUTROABS 7.6  --   HGB 10.0* 9.5*  HCT 30.4* 28.8*  MCV 87.6 89.2  PLT 362 [id number]   Basic Metabolic Panel: Recent Labs  Lab 10/17/19 1720 10/18/19 0632  NA 126* 129*  K 4.7 4.1  CL 88* 91*  CO2 29 29  GLUCOSE 102* 87  BUN 6* 6*  CREATININE 0.71 0.78  CALCIUM 8.9 8.7*   GFR: Estimated Creatinine Clearance: 98.7 mL/min (by C-G  formula based on SCr of 0.78 mg/dL). Liver Function Tests: Recent Labs  Lab 10/17/19 1720  AST 23  ALT 14  ALKPHOS 69  BILITOT 0.5  PROT 6.7  ALBUMIN 3.3*   No results for input(s): LIPASE, AMYLASE in the last 168 hours. No results for input(s): AMMONIA in the last 168 hours. Coagulation Profile: Recent Labs  Lab 10/17/19 1720  INR 2.3*   Cardiac Enzymes: No results for input(s): CKTOTAL, CKMB, CKMBINDEX, TROPONINI in the last 168 hours. BNP (last 3 results) No results for input(s): PROBNP in the last 8760 hours. HbA1C: No results for input(s): HGBA1C in the last 72 hours. CBG: No results for input(s): GLUCAP in the last 168 hours. Lipid Profile: No results for input(s): CHOL, HDL, LDLCALC, TRIG, CHOLHDL, LDLDIRECT in the last 72 hours. Thyroid Function Tests: No results for input(s): TSH, T4TOTAL, FREET4, T3FREE, THYROIDAB in the last 72 hours. Anemia Panel: No results for input(s): VITAMINB12, FOLATE, FERRITIN, TIBC, IRON, RETICCTPCT in the last 72 hours. Sepsis Labs: Recent Labs  Lab 10/17/19 1720 10/17/19 1920  LATICACIDVEN 0.9 1.2    Recent Results (from the past 240 hour(s))  Culture, blood (Routine x 2)     Status: None (Preliminary result)   Collection Time: 10/17/19  5:20 PM   Specimen: BLOOD  Result Value Ref Range Status   Specimen Description BLOOD RIGHT ANTECUBITAL  Final   Special Requests   Final    BOTTLES DRAWN AEROBIC AND ANAEROBIC Blood Culture adequate volume   Culture   Final    NO GROWTH < 24 HOURS Performed at [facility], [address] [place], [place] [address]    Report Status PENDING  Incomplete  Culture, blood (Routine x 2)     Status: None (Preliminary result)   Collection Time: 10/17/19  5:25 PM   Specimen: BLOOD LEFT HAND  Result Value Ref Range Status   Specimen Description BLOOD LEFT HAND  Final   Special Requests   Final    BOTTLES DRAWN AEROBIC AND ANAEROBIC Blood Culture adequate volume   Culture   Final     NO GROWTH < 12 HOURS Performed at [facility], [address], [place], [place] [address]    Report Status PENDING  Incomplete  SARS Coronavirus 2 by RT PCR (hospital order, performed in [place] hospital lab) Nasopharyngeal Nasopharyngeal Swab     Status: None   Collection Time: 10/18/19  3:54 PM   Specimen: Nasopharyngeal Swab  Result Value Ref Range Status   SARS Coronavirus 2 NEGATIVE NEGATIVE Final    Comment: (NOTE) SARS-CoV-2 target nucleic acids are NOT DETECTED. The SARS-CoV-2 RNA is generally detectable in upper and lower respiratory specimens during the acute phase of infection. The lowest concentration of SARS-CoV-2 viral copies this assay can detect is 250 copies / mL. A negative result does not preclude SARS-CoV-2  infection and should not be used as the sole basis for treatment or other patient management decisions.  A negative result may occur with improper specimen collection / handling, submission of specimen other than nasopharyngeal swab, presence of viral mutation(s) within the areas targeted by this assay, and inadequate number of viral copies (<250 copies / mL). A negative result must be combined with clinical observations, patient history, and epidemiological information. Fact Sheet for Patients:   StrictlyIdeas.no Fact Sheet for Healthcare Providers: BankingDealers.co.za This test is not yet approved or cleared  by the [place] FDA and has been authorized for detection and/or diagnosis of SARS-CoV-2 by FDA under an Emergency Use Authorization (EUA).  This EUA will remain in effect (meaning this test can be used) for the duration of the COVID-19 declaration under Section 564(b)(1) of the Act, 21 U.S.C. section 360bbb-3(b)(1), unless the authorization is terminated or revoked sooner. Performed at [facility], [address] [place], [place] [address]      Radiology Studies: DG  Chest 2 View  Result Date: 10/17/2019 CLINICAL DATA:  Suspected sepsis EXAM: CHEST - 2 VIEW COMPARISON:  06/12/2019 FINDINGS: Left-sided pacing device as before. No focal opacity, pleural effusion, or pneumothorax. Stable mild cardiomegaly with aortic atherosclerosis. No pneumothorax. Hyperinflation with emphysematous disease IMPRESSION: No active cardiopulmonary disease. Hyperinflation with emphysematous disease Electronically Signed   By: Donavan Foil M.D.   On: 10/17/2019 18:21   US Venous Img Lower Unilateral Left  Addendum Date: 10/18/2019   ADDENDUM REPORT: 10/18/2019 01:53 ADDENDUM: These results were called by telephone at the time of interpretation on 10/18/2019 at 1:53 am to provider [facility] , who verbally acknowledged these results. Electronically Signed   By: Lovena Le M.D.   On: 10/18/2019 01:53   Result Date: 10/18/2019 CLINICAL DATA:  Pain and swelling the left leg, history of CHF EXAM: LEFT LOWER EXTREMITY VENOUS DOPPLER ULTRASOUND TECHNIQUE: Gray-scale sonography with compression, as well as color and duplex ultrasound, were performed to evaluate the deep venous system(s) from the level of the common femoral vein through the popliteal and proximal calf veins. COMPARISON:  None. FINDINGS: VENOUS There is hypoechoic incompletely compressible, nonocclusive, peripherally marginated filling defects likely reflecting thrombus in the left common femoral, femoral and popliteal vein as well as the posterior tibial and peroneal veins of the calf. Normal direction of color venous flow. Normal preservation of the respiratory phasicity. Limited views of the contralateral common femoral vein are unremarkable. OTHER Likely surgical absence of the left greater saphenous vein. Limitations: none IMPRESSION: Incompletely compressible, nonocclusive thrombus seen throughout the deep venous system of the left lower extremity. A peripherally marginated hypoechoic appearance may suggest chronicity though  acute thrombus is not excluded. Surgical absence of left greater saphenous vein. Currently attempting to contact the ordering provider with a critical value result. Addendum will be submitted upon case discussion. Chest Electronically Signed: By: Lovena Le M.D. On: 10/18/2019 01:46   DG Foot Complete Left  Result Date: 10/17/2019 CLINICAL DATA:  Left foot pain. Technologist notes state gangrenous foot. Concern for osteomyelitis. Two toes amputated 10 days ago. EXAM: LEFT FOOT - COMPLETE 3+ VIEW COMPARISON:  None. FINDINGS: Resection of the first, second, and third digit. The first, second, and third metatarsal heads are eroded. There is adjacent soft tissue edema and mottled gas in the soft tissues. No evidence of osteomyelitis of the in situ fourth and fifth digit. There is generalized soft tissue edema. Advanced vascular calcifications. IMPRESSION: 1. Findings consistent with osteomyelitis  of the first, second, and third metatarsal heads. 2. Soft tissue edema with mottled gas in the soft tissues suspicious for soft tissue infection. 3. Generalized soft tissue edema. Advanced vascular calcifications. Electronically Signed   By: Keith Rake M.D.   On: 10/17/2019 23:42    Scheduled Meds: . aspirin EC  81 mg Oral QPC supper  . citalopram  10 mg Oral Daily  . docusate sodium  100 mg Oral Daily  . fluticasone  2 spray Each Nare Daily  . furosemide  20 mg Oral Daily  . gabapentin  300 mg Oral BID  . guaiFENesin  600 mg Oral QPM  . ipratropium  2 spray Each Nare BID  . Ipratropium-Albuterol  1 puff Inhalation Q4H  . ketorolac      . loratadine  10 mg Oral QHS  . metoprolol succinate  25 mg Oral Daily  . mometasone-formoterol  2 puff Inhalation BID  . pantoprazole  40 mg Oral QAC breakfast  . pravastatin  20 mg Oral q1800  . Restore Wound Care Dressing  1 each Topical Daily  . sacubitril-valsartan  1 tablet Oral BID  . tamsulosin  0.4 mg Oral QPC supper  . tiotropium  18 mcg Inhalation  Daily  . vitamin B-12  1,000 mcg Oral Daily   Continuous Infusions: . sodium chloride 100 mL/hr at 10/18/19 1211  . piperacillin-tazobactam (ZOSYN)  IV Stopped (10/18/19 1627)  . vancomycin       LOS: 0 days   Time spent: 40 minutes  Lorella Nimrod, MD [facility]  If 7PM-7AM, please contact night-coverage [email or web address]  10/18/2019, 6:10 PM   This record has been created using Systems analyst. Errors have been sought and corrected,but may not always be located. Such creation errors do not reflect on the standard of care.

## 2019-10-18 NOTE — ED Notes (Addendum)
Called pharmacy for missing neb.  Have checked with supply, pharmacy, and admitting MD about wound dressing due in MAR. Unsure of where to get.  Wound RN was not able to clarify.  Will wait for podiatry to see pt.

## 2019-10-18 NOTE — H&P (Signed)
[facility] [place] NAME: Tyler [name]    MR#:  [id number]  DATE OF BIRTH:  Sep 12, 1955  DATE OF ADMISSION:  10/17/2019  PRIMARY CARE PHYSICIAN: [name], MD   REQUESTING/REFERRING PHYSICIAN: Marjean Donna, MD  CHIEF COMPLAINT:   Chief Complaint  Patient presents with  . Post-op Problem    HISTORY OF PRESENT ILLNESS:  Tyler [name]  is a 65 y.o. Caucasian male with a known history of CHF, COPD, coronary disease, atrial fibrillation, hypertension and peripheral vascular disease, status post AICD, presented to emergency room with acute onset of worsening left foot swelling with erythema and discharge.  Patient had his left big toe amputated on 3/15 by Dr. Vickki Muff and later on had his second and third toes amputated on 5/14.  His erythema has been extending to his left flank.  No fever or chills, nausea or vomiting or abdominal pain.  No chest pain or palpitations.  No dysuria, oliguria or hematuria or flank pain.  Upon presentation to the emergency room, pulse symmetry was 97% on 4 L O2 by nasal cannula vital signs otherwise were normal.  Labs revealed hyponatremia 126 hypochloremia 88 unremarkable CMP except for albumin 3.3.  Lactic acid 0.9 later 1.2 and CBC showed anemia.  Left foot x-ray showed findings consistent with osteomyelitis of the first second and third metatarsal heads and soft tissue edema with mottled gas in the soft tissues suspicious for soft tissue infection is generalized soft tissue edema based and advanced vascular calcification.  The patient was given IV Rocephin and vancomycin, a minimum of IV Dilaudid, 2 duo nebs, 4 mg of IV morphine sulfate and 4 mg IV Zofran.  He will be admitted to a medical bed for further evaluation and management.  PAST MEDICAL HISTORY:   Past Medical History:  Diagnosis Date  . AICD (automatic cardioverter/defibrillator) present   . Anxiety   . Arthritis   . Atherosclerosis of artery of extremity  with ulceration ([place]) 09/2019   left foot s/p toe amp requiring debridement and futher toe amputations.  . Atrial fibrillation ([place])   . Cervical spinal stenosis    with neuropathy  . CHF (congestive heart failure) ([place])   . Constipation   . COPD (chronic obstructive pulmonary disease) ([place])   . Coronary artery disease   . Depression   . Dyspnea   . Dysrhythmia    atrial fibrillation  . Emphysema of lung ([place])   . GERD (gastroesophageal reflux disease)   . Hypertension   . Lung nodule seen on imaging study    being followed by dr. [name]. just watching it for last few years, without change  . Myocardial infarction [facility]) 2004   stent placed, pacemaker implanted 2005  . Oxygen dependent    requires 2L nasal prong oxygen 24 hours a day  . Peripheral vascular disease ([place])   . Presence of permanent cardiac pacemaker [phone]    PAST SURGICAL HISTORY:   Past Surgical History:  Procedure Laterality Date  . ABOVE KNEE LEG AMPUTATION Right    after below the knee amputation   . AMPUTATION TOE Left 08/04/2019   Procedure: AMPUTATION TOE MPJ LEFT;  Surgeon: Samara Deist, DPM;  Location: [facility] ORS;  Service: Podiatry;  Laterality: Left;  . AMPUTATION TOE Left 10/06/2019   Procedure: AMPUTATION TOE MPJ T1,T2 LEFT;  Surgeon: Samara Deist, DPM;  Location: [facility];  Service: Podiatry;  Laterality: Left;  . APPLICATION OF WOUND VAC Left 01/12/2018  Procedure: APPLICATION OF WOUND VAC;  Surgeon: Algernon Huxley, MD;  Location: [facility] OR[facility];  Service: Vascular;  Laterality: Left;  . BELOW KNEE LEG AMPUTATION Right   . CATARACT EXTRACTION, BILATERAL Bilateral   . ENDARTERECTOMY FEMORAL Left 08/11/2017   Procedure: ENDARTERECTOMY FEMORAL;  Surgeon: Algernon Huxley, MD;  Location: [facility] OR[facility];  Service: Vascular;  Laterality: Left;  . HEMATOMA EVACUATION Left 01/12/2018   Procedure: EVACUATION HEMATOMA ( DRAINING OF SEROMA);  Surgeon: Algernon Huxley, MD;  Location: [facility];  Service: Vascular;  Laterality:  Left;  . IMPLANTABLE CARDIOVERTER DEFIBRILLATOR (ICD) GENERATOR CHANGE Left 02/10/2017   Procedure: ICD GENERATOR CHANGE;  Surgeon: Isaias Cowman, MD;  Location: [facility];  Service: Cardiovascular;  Laterality: Left;  . INSERT / REPLACE / REMOVE PACEMAKER  [id number]  . LOWER EXTREMITY ANGIOGRAPHY Left 06/07/2017   Procedure: LOWER EXTREMITY ANGIOGRAPHY;  Surgeon: Algernon Huxley, MD;  Location: [place] [facility];  Service: Cardiovascular;  Laterality: Left;  . LOWER EXTREMITY ANGIOGRAPHY Left 10/05/2019   Procedure: LOWER EXTREMITY ANGIOGRAPHY;  Surgeon: Algernon Huxley, MD;  Location: [place] [facility];  Service: Cardiovascular;  Laterality: Left;  . LOWER EXTREMITY INTERVENTION  06/07/2017   Procedure: LOWER EXTREMITY INTERVENTION;  Surgeon: Algernon Huxley, MD;  Location: [place] [facility];  Service: Cardiovascular;;  . PERIPHERAL VASCULAR CATHETERIZATION Left 12/09/2015   Procedure: Lower Extremity Angiography;  Surgeon: Algernon Huxley, MD;  Location: [place] [facility];  Service: Cardiovascular;  Laterality: Left;  . PERIPHERAL VASCULAR CATHETERIZATION  12/09/2015   Procedure: Lower Extremity Intervention;  Surgeon: Algernon Huxley, MD;  Location: [place] [facility];  Service: Cardiovascular;;  . TONSILLECTOMY      SOCIAL HISTORY:   Social History   Tobacco Use  . Smoking status: Former Smoker    Packs/day: 1.00    Years: 42.00    Pack years: 42.00    Types: Cigarettes    Quit date: 09/23/2013    Years since quitting: 6.0  . Smokeless tobacco: Never Used  Substance Use Topics  . Alcohol use: No    FAMILY HISTORY:   Family History  Problem Relation Age of Onset  . Cancer Mother   . Cancer Father   . Heart disease Father     DRUG ALLERGIES:   Allergies  Allergen Reactions  . Apixaban Rash  . Rivaroxaban Rash    REVIEW OF SYSTEMS:   ROS As per history of present illness. All pertinent systems were reviewed above. Constitutional,  HEENT, cardiovascular,  respiratory, GI, GU, musculoskeletal, neuro, psychiatric, endocrine,  integumentary and hematologic systems were reviewed and are otherwise  negative/unremarkable except for positive findings mentioned above in the HPI.   MEDICATIONS AT HOME:   Prior to Admission medications   Medication Sig Start Date End Date Taking? Authorizing Provider  acetaminophen (TYLENOL) 500 MG tablet Take 1,000 mg by mouth daily as needed for moderate pain or headache.    [provider]  ALPRAZolam Duanne Moron) 1 MG tablet Take 1 mg by mouth 2 (two) times daily as needed for anxiety or sleep.     [provider]  aspirin EC 81 MG tablet Take 1 tablet (81 mg total) by mouth daily. Patient taking differently: Take 81 mg by mouth daily after supper.  12/09/15   Algernon Huxley, MD  citalopram (CELEXA) 10 MG tablet Take 10 mg by mouth daily.    [provider]  docusate sodium (COLACE) 100 MG capsule Take 100 mg by  mouth daily.     [provider]  ENTRESTO 24-26 MG Take 1 tablet by mouth 2 (two) times daily. 01/05/19   [provider]  fluticasone (FLONASE) 50 MCG/ACT nasal spray Place 2 sprays into both nostrils daily.    [provider]  fluticasone-salmeterol (ADVAIR HFA) 115-21 MCG/ACT inhaler USE 2 INHALATIONS ORALLY EVERY 12 HOURS Patient taking differently: Inhale 2 puffs into the lungs every 12 (twelve) hours.  05/30/18   Flora Lipps, MD  furosemide (LASIX) 20 MG tablet Take 20 mg by mouth daily.     [provider]  gabapentin (NEURONTIN) 100 MG capsule Take 300 mg by mouth in the morning, at noon, and at bedtime. 09/20/19   [provider]  guaiFENesin (MUCINEX) 600 MG 12 hr tablet Take 600 mg by mouth every evening.     [provider]  ipratropium (ATROVENT) 0.03 % nasal spray Place 2 sprays into both nostrils 2 (two) times daily.     [provider]  Ipratropium-Albuterol (COMBIVENT RESPIMAT) 20-100 MCG/ACT AERS respimat Inhale  1 puff into the lungs every 4 (four) hours.     [provider]  levalbuterol (XOPENEX) 1.25 MG/3ML nebulizer solution Take 1.25 mg (3 mLs total) by nebulization every 4 (four) hours. Patient taking differently: Take 3 mLs by nebulization every 4 (four) hours as needed for wheezing or shortness of breath.  10/23/16   Theodoro Grist, MD  loratadine (CLARITIN) 10 MG tablet Take 10 mg by mouth at bedtime.    [provider]  lovastatin (MEVACOR) 20 MG tablet Take 20 mg by mouth at bedtime.    [provider]  metoprolol succinate (TOPROL-XL) 25 MG 24 hr tablet Take 25 mg by mouth daily.    [provider]  nitroGLYCERIN (NITROSTAT) 0.4 MG SL tablet Place 0.4 mg under the tongue every 5 (five) minutes as needed for chest pain.    [provider]  oxyCODONE-acetaminophen (PERCOCET) 7.5-325 MG tablet Take 1 tablet by mouth every 6 (six) hours as needed for moderate pain.     [provider]  pantoprazole (PROTONIX) 40 MG tablet Take 40 mg by mouth daily before breakfast.     [provider]  Respiratory Therapy Supplies (FLUTTER) DEVI Use as directed. 05/15/19   Magdalen Spatz, NP  SPIRIVA RESPIMAT 1.25 MCG/ACT AERS INHALE 2 PUFFS BY MOUTH INTO THE LUNGS DAILY Patient taking differently: Inhale 2 puffs into the lungs daily.  05/29/19   Flora Lipps, MD  tamsulosin (FLOMAX) 0.4 MG CAPS capsule Take 0.4 mg by mouth daily after supper.  03/07/18   [provider]  vitamin B-12 (CYANOCOBALAMIN) 1000 MCG tablet Take 1,000 mcg by mouth daily.    [provider]  warfarin (COUMADIN) 5 MG tablet Take 5 mg by mouth at bedtime.     [provider]  Wound Dressings (RESTORE WOUND CARE DRESSING) PADS Apply 1 each topically daily.  01/25/18   [provider]      VITAL SIGNS:  Blood pressure 134/68, pulse 75, temperature 98.5 F (36.9 C), resp. rate (!) 22, height 6\' 1"  (1.854 m), weight 74.8 kg, SpO2 98 %.  PHYSICAL  EXAMINATION:  Physical Exam  GENERAL:  64 y.o.-year-old Caucasian male patient lying in the bed with no acute distress.  EYES: Pupils equal, round, reactive to light and accommodation. No scleral icterus. Extraocular muscles intact.  HEENT: Head atraumatic, normocephalic. Oropharynx and nasopharynx clear.  NECK:  Supple, no jugular venous distention. No thyroid enlargement,  no tenderness.  LUNGS: Normal breath sounds bilaterally, no wheezing, rales,rhonchi or crepitation. No use of accessory muscles of respiration.  CARDIOVASCULAR: Regular rate and rhythm, S1, S2 normal. No murmurs, rubs, or gallops.  ABDOMEN: Soft, nondistended, nontender. Bowel sounds present. No organomegaly or mass.  EXTREMITIES/SKIN: Swollen erythematous left leg with scabbing and black discoloration of the first 3 metatarsal heads with associated tenderness and purulent discharge NEUROLOGIC: Cranial nerves II through XII are intact. Muscle strength 5/5 in all extremities. Sensation intact. Gait not checked.  PSYCHIATRIC: The patient is alert and oriented x 3.  Normal affect and good eye contact. SKIN: As below.     LABORATORY PANEL:   CBC Recent Labs  Lab 10/17/19 1720  WBC 10.2  HGB 10.0*  HCT 30.4*  PLT 362   ------------------------------------------------------------------------------------------------------------------  Chemistries  Recent Labs  Lab 10/17/19 1720  NA 126*  K 4.7  CL 88*  CO2 29  GLUCOSE 102*  BUN 6*  CREATININE 0.71  CALCIUM 8.9  AST 23  ALT 14  ALKPHOS 69  BILITOT 0.5   ------------------------------------------------------------------------------------------------------------------  Cardiac Enzymes No results for input(s): TROPONINI in the last 168 hours. ------------------------------------------------------------------------------------------------------------------  RADIOLOGY:  DG Chest 2 View  Result Date: 10/17/2019 CLINICAL DATA:  Suspected sepsis EXAM:  CHEST - 2 VIEW COMPARISON:  06/12/2019 FINDINGS: Left-sided pacing device as before. No focal opacity, pleural effusion, or pneumothorax. Stable mild cardiomegaly with aortic atherosclerosis. No pneumothorax. Hyperinflation with emphysematous disease IMPRESSION: No active cardiopulmonary disease. Hyperinflation with emphysematous disease Electronically Signed   By: Donavan Foil M.D.   On: 10/17/2019 18:21   US Venous Img Lower Unilateral Left  Addendum Date: 10/18/2019   ADDENDUM REPORT: 10/18/2019 01:53 ADDENDUM: These results were called by telephone at the time of interpretation on 10/18/2019 at 1:53 am to provider [facility] , who verbally acknowledged these results. Electronically Signed   By: Lovena Le M.D.   On: 10/18/2019 01:53   Result Date: 10/18/2019 CLINICAL DATA:  Pain and swelling the left leg, history of CHF EXAM: LEFT LOWER EXTREMITY VENOUS DOPPLER ULTRASOUND TECHNIQUE: Gray-scale sonography with compression, as well as color and duplex ultrasound, were performed to evaluate the deep venous system(s) from the level of the common femoral vein through the popliteal and proximal calf veins. COMPARISON:  None. FINDINGS: VENOUS There is hypoechoic incompletely compressible, nonocclusive, peripherally marginated filling defects likely reflecting thrombus in the left common femoral, femoral and popliteal vein as well as the posterior tibial and peroneal veins of the calf. Normal direction of color venous flow. Normal preservation of the respiratory phasicity. Limited views of the contralateral common femoral vein are unremarkable. OTHER Likely surgical absence of the left greater saphenous vein. Limitations: none IMPRESSION: Incompletely compressible, nonocclusive thrombus seen throughout the deep venous system of the left lower extremity. A peripherally marginated hypoechoic appearance may suggest chronicity though acute thrombus is not excluded. Surgical absence of left greater saphenous  vein. Currently attempting to contact the ordering provider with a critical value result. Addendum will be submitted upon case discussion. Chest Electronically Signed: By: Lovena Le M.D. On: 10/18/2019 01:46   DG Foot Complete Left  Result Date: 10/17/2019 CLINICAL DATA:  Left foot pain. Technologist notes state gangrenous foot. Concern for osteomyelitis. Two toes amputated 10 days ago. EXAM: LEFT FOOT - COMPLETE 3+ VIEW COMPARISON:  None. FINDINGS: Resection of the first, second, and third digit. The first, second, and third metatarsal heads are eroded. There is adjacent soft tissue edema and mottled gas in  the soft tissues. No evidence of osteomyelitis of the in situ fourth and fifth digit. There is generalized soft tissue edema. Advanced vascular calcifications. IMPRESSION: 1. 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 2. Soft tissue edema with mottled gas in the soft tissues suspicious for soft tissue infection. 3. Generalized soft tissue edema. Advanced vascular calcifications. Electronically Signed   By: Keith Rake M.D.   On: 10/17/2019 23:42      IMPRESSION AND PLAN:   1.  Left lower extremity severe nonpurulent cellulitis with associated left foot osteomyelitis and gangrene with purulent cellulitis. -The patient will be admitted to a medical bed. -He will be hydrated with IV normal saline. -Should be kept n.p.o. -We will continue antibiotic therapy with IV vancomycin and Zosyn. -Warm compresses will be utilized. -Podiatry consultation will be obtained by Dr. Vickki Muff this a.m.  2.  Hypertension. -We will continue Entresto to.  3.  Anxiety and depression. -We will continue Celexa and Xanax.  4.  Peripheral neuropathy. -We will continue Neurontin.  5.  BPH. -We will continue Flomax.  6.  COPD without current exacerbation. -We will continue his Advair Diskus and Atrovent.  7.  DVT prophylaxis. -We will continue  Coumadin.    All the records are reviewed and case discussed with ED provider. The plan of care was discussed in details with the patient (and family). I answered all questions. The patient agreed to proceed with the above mentioned plan. Further management will depend upon hospital course.   CODE STATUS: Full code  Status is: Inpatient  Not inpatient appropriate, will call UM team and downgrade to OBS.   Dispo: The patient is from: Home              Anticipated d/c is to: Home              Anticipated d/c date is: 3 days              Patient currently is not medically stable to d/c.   TOTAL TIME TAKING CARE OF THIS PATIENT: 55 minutes.    Christel Mormon M.D on 10/18/2019 at 2:16 AM  [facility]   From 7 PM-7 AM, contact night-coverage [email or web address]  CC: Primary care physician; [name], MD   Note: This dictation was prepared with Dragon dictation along with smaller phrase technology. Any transcriptional errors that result from this process are unintentional.

## 2019-10-18 NOTE — Progress Notes (Signed)
CODE SEPSIS - PHARMACY COMMUNICATION  **Broad Spectrum Antibiotics should be administered within 1 hour of Sepsis diagnosis**  Time Code Sepsis Called/Page Received: 2321  Antibiotics Ordered: Rocephin, Vancomycin  Time of 1st antibiotic administration: 2346  Additional action taken by pharmacy: n/a  If necessary, Name of Provider/Nurse Contacted: n/a    Ena Dawley ,PharmD Clinical Pharmacist  10/18/2019  12:37 AM

## 2019-10-18 NOTE — Progress Notes (Signed)
PHARMACY -  BRIEF ANTIBIOTIC NOTE   Pharmacy has received consult(s) for Vancomycin from an ED provider.  The patient's profile has been reviewed for ht/wt/allergies/indication/available labs.    One time order(s) placed for Vancomycin 1gm  Further antibiotics/pharmacy consults should be ordered by admitting physician if indicated.                       Thank you, Hart Robinsons A 10/18/2019  12:39 AM

## 2019-10-18 NOTE — ED Notes (Signed)
Pt alert and oriented.  Swelling to LLE with redness to calf and foot.  Wound to left foot with large wound; yellow and black coloring to wound bed.

## 2019-10-18 NOTE — Consult Note (Signed)
Reason for Consult: Gangrene with osteomyelitis left foot. Referring Physician: [name] is an 64 y.o. male.  HPI: This is a 64 year old male with a history of significant peripheral vascular disease having recently undergone amputation of multiple toes on the left foot.  Patient seen in the office yesterday with significant increase in infection and gangrenous changes to the forefoot and decision was made for admission for an attempted transmetatarsal amputation as a limb salvage.  Past Medical History:  Diagnosis Date  . AICD (automatic cardioverter/defibrillator) present   . Anxiety   . Arthritis   . Atherosclerosis of artery of extremity with ulceration ([place]) 09/2019   left foot s/p toe amp requiring debridement and futher toe amputations.  . Atrial fibrillation ([place])   . Cervical spinal stenosis    with neuropathy  . CHF (congestive heart failure) ([place])   . Constipation   . COPD (chronic obstructive pulmonary disease) ([place])   . Coronary artery disease   . Depression   . Dyspnea   . Dysrhythmia    atrial fibrillation  . Emphysema of lung ([place])   . GERD (gastroesophageal reflux disease)   . Hypertension   . Lung nodule seen on imaging study    being followed by dr. [name]. just watching it for last few years, without change  . Myocardial infarction [facility]) 2004   stent placed, pacemaker implanted 2005  . Oxygen dependent    requires 2L nasal prong oxygen 24 hours a day  . Peripheral vascular disease ([place])   . Presence of permanent cardiac pacemaker [phone]    Past Surgical History:  Procedure Laterality Date  . ABOVE KNEE LEG AMPUTATION Right    after below the knee amputation   . AMPUTATION TOE Left 08/04/2019   Procedure: AMPUTATION TOE MPJ LEFT;  Surgeon: Samara Deist, DPM;  Location: [facility];  Service: Podiatry;  Laterality: Left;  . AMPUTATION TOE Left 10/06/2019   Procedure: AMPUTATION TOE MPJ T1,T2 LEFT;  Surgeon: Samara Deist, DPM;  Location:  [facility];  Service: Podiatry;  Laterality: Left;  . APPLICATION OF WOUND VAC Left 01/12/2018   Procedure: APPLICATION OF WOUND VAC;  Surgeon: Algernon Huxley, MD;  Location: [facility];  Service: Vascular;  Laterality: Left;  . BELOW KNEE LEG AMPUTATION Right   . CATARACT EXTRACTION, BILATERAL Bilateral   . ENDARTERECTOMY FEMORAL Left 08/11/2017   Procedure: ENDARTERECTOMY FEMORAL;  Surgeon: Algernon Huxley, MD;  Location: [facility];  Service: Vascular;  Laterality: Left;  . HEMATOMA EVACUATION Left 01/12/2018   Procedure: EVACUATION HEMATOMA ( DRAINING OF SEROMA);  Surgeon: Algernon Huxley, MD;  Location: [facility];  Service: Vascular;  Laterality: Left;  . IMPLANTABLE CARDIOVERTER DEFIBRILLATOR (ICD) GENERATOR CHANGE Left 02/10/2017   Procedure: ICD GENERATOR CHANGE;  Surgeon: Isaias Cowman, MD;  Location: [facility];  Service: Cardiovascular;  Laterality: Left;  . INSERT / REPLACE / REMOVE PACEMAKER  [id number]  . LOWER EXTREMITY ANGIOGRAPHY Left 06/07/2017   Procedure: LOWER EXTREMITY ANGIOGRAPHY;  Surgeon: Algernon Huxley, MD;  Location: [place] [facility];  Service: Cardiovascular;  Laterality: Left;  . LOWER EXTREMITY ANGIOGRAPHY Left 10/05/2019   Procedure: LOWER EXTREMITY ANGIOGRAPHY;  Surgeon: Algernon Huxley, MD;  Location: [place] [facility];  Service: Cardiovascular;  Laterality: Left;  . LOWER EXTREMITY INTERVENTION  06/07/2017   Procedure: LOWER EXTREMITY INTERVENTION;  Surgeon: Algernon Huxley, MD;  Location: [place] [facility];  Service: Cardiovascular;;  . PERIPHERAL VASCULAR CATHETERIZATION Left 12/09/2015   Procedure:  Lower Extremity Angiography;  Surgeon: Algernon Huxley, MD;  Location: [place] [facility];  Service: Cardiovascular;  Laterality: Left;  . PERIPHERAL VASCULAR CATHETERIZATION  12/09/2015   Procedure: Lower Extremity Intervention;  Surgeon: Algernon Huxley, MD;  Location: [place] [facility];  Service: Cardiovascular;;  . TONSILLECTOMY      Family History  Problem Relation Age of  Onset  . Cancer Mother   . Cancer Father   . Heart disease Father     Social History:  reports that he quit smoking about 6 years ago. His smoking use included cigarettes. He has a 42.00 pack-year smoking history. He has never used smokeless tobacco. He reports that he does not drink alcohol or use drugs.  Allergies:  Allergies  Allergen Reactions  . Apixaban Rash  . Rivaroxaban Rash    Medications:  Scheduled: . aspirin EC  81 mg Oral QPC supper  . citalopram  10 mg Oral Daily  . docusate sodium  100 mg Oral Daily  . fluticasone  2 spray Each Nare Daily  . furosemide  20 mg Oral Daily  . gabapentin  300 mg Oral BID  . guaiFENesin  600 mg Oral QPM  . ipratropium  2 spray Each Nare BID  . Ipratropium-Albuterol  1 puff Inhalation Q4H  . loratadine  10 mg Oral QHS  . metoprolol succinate  25 mg Oral Daily  . mometasone-formoterol  2 puff Inhalation BID  . pantoprazole  40 mg Oral QAC breakfast  . pravastatin  20 mg Oral q1800  . Restore Wound Care Dressing  1 each Topical Daily  . sacubitril-valsartan  1 tablet Oral BID  . tamsulosin  0.4 mg Oral QPC supper  . tiotropium  18 mcg Inhalation Daily  . vitamin B-12  1,000 mcg Oral Daily    Results for orders placed or performed during the hospital encounter of 10/17/19 (from the past 48 hour(s))  Comprehensive metabolic panel     Status: Abnormal   Collection Time: 10/17/19  5:20 PM  Result Value Ref Range   Sodium 126 (L) 135 - 145 mmol/L   Potassium 4.7 3.5 - 5.1 mmol/L   Chloride 88 (L) 98 - 111 mmol/L   CO2 29 22 - 32 mmol/L   Glucose, Bld 102 (H) 70 - 99 mg/dL    Comment: Glucose reference range applies only to samples taken after fasting for at least 8 hours.   BUN 6 (L) 8 - 23 mg/dL   Creatinine, Ser 0.71 0.61 - 1.24 mg/dL   Calcium 8.9 8.9 - 10.3 mg/dL   Total Protein 6.7 6.5 - 8.1 g/dL   Albumin 3.3 (L) 3.5 - 5.0 g/dL   AST 23 15 - 41 U/L   ALT 14 0 - 44 U/L   Alkaline Phosphatase 69 38 - 126 U/L   Total  Bilirubin 0.5 0.3 - 1.2 mg/dL   GFR calc non Af Amer >60 >60 mL/min   GFR calc Af Amer >60 >60 mL/min   Anion gap 9 5 - 15    Comment: Performed at [facility], [address] [place], [place] [address]  Lactic acid, plasma     Status: None   Collection Time: 10/17/19  5:20 PM  Result Value Ref Range   Lactic Acid, Venous 0.9 0.5 - 1.9 mmol/L    Comment: Performed at [facility], [address], [place], [place] [address]  CBC with Differential     Status: Abnormal   Collection  Time: 10/17/19  5:20 PM  Result Value Ref Range   WBC 10.2 4.0 - 10.5 K/uL   RBC 3.47 (L) 4.22 - 5.81 MIL/uL   Hemoglobin 10.0 (L) 13.0 - 17.0 g/dL   HCT 30.4 (L) 39.0 - 52.0 %   MCV 87.6 80.0 - 100.0 fL   MCH 28.8 26.0 - 34.0 pg   MCHC 32.9 30.0 - 36.0 g/dL   RDW 13.0 11.5 - 15.5 %   Platelets 362 150 - 400 K/uL   nRBC 0.0 0.0 - 0.2 %   Neutrophils Relative % 76 %   Neutro Abs 7.6 1.7 - 7.7 K/uL   Lymphocytes Relative 10 %   Lymphs Abs 1.1 0.7 - 4.0 K/uL   Monocytes Relative 12 %   Monocytes Absolute 1.2 (H) 0.1 - 1.0 K/uL   Eosinophils Relative 2 %   Eosinophils Absolute 0.2 0.0 - 0.5 K/uL   Basophils Relative 0 %   Basophils Absolute 0.0 0.0 - 0.1 K/uL   Immature Granulocytes 0 %   Abs Immature Granulocytes 0.04 0.00 - 0.07 K/uL    Comment: Performed at [facility], [address] [place], [place] [address]  Protime-INR     Status: Abnormal   Collection Time: 10/17/19  5:20 PM  Result Value Ref Range   Prothrombin Time 24.5 (H) 11.4 - 15.2 seconds   INR 2.3 (H) 0.8 - 1.2    Comment: (NOTE) INR goal varies based on device and disease states. Performed at [facility], [address] [place], [place] [address]   Culture, blood (Routine x 2)     Status: None (Preliminary result)   Collection Time: 10/17/19  5:20 PM   Specimen: BLOOD  Result Value Ref Range   Specimen Description BLOOD RIGHT ANTECUBITAL    Special Requests      BOTTLES DRAWN  AEROBIC AND ANAEROBIC Blood Culture adequate volume   Culture      NO GROWTH < 24 HOURS Performed at [facility], [address], [place], [place] [address]    Report Status PENDING   Culture, blood (Routine x 2)     Status: None (Preliminary result)   Collection Time: 10/17/19  5:25 PM   Specimen: BLOOD LEFT HAND  Result Value Ref Range   Specimen Description BLOOD LEFT HAND    Special Requests      BOTTLES DRAWN AEROBIC AND ANAEROBIC Blood Culture adequate volume   Culture      NO GROWTH < 12 HOURS Performed at [facility], [address] [place], [place] [address]    Report Status PENDING   Lactic acid, plasma     Status: None   Collection Time: 10/17/19  7:20 PM  Result Value Ref Range   Lactic Acid, Venous 1.2 0.5 - 1.9 mmol/L    Comment: Performed at [facility], [address] [place], [place] [address]  Urinalysis, Complete w Microscopic     Status: Abnormal   Collection Time: 10/18/19  6:30 AM  Result Value Ref Range   Color, Urine YELLOW (A) YELLOW   APPearance CLEAR (A) CLEAR   Specific Gravity, Urine 1.006 1.005 - 1.030   pH 6.0 5.0 - 8.0   Glucose, UA NEGATIVE NEGATIVE mg/dL   Hgb urine dipstick SMALL (A) NEGATIVE   Bilirubin Urine NEGATIVE NEGATIVE   Ketones, ur NEGATIVE NEGATIVE mg/dL   Protein, ur NEGATIVE NEGATIVE mg/dL   Nitrite NEGATIVE NEGATIVE   Leukocytes,Ua NEGATIVE NEGATIVE   RBC /  HPF 0-5 0 - 5 RBC/hpf   WBC, UA 0-5 0 - 5 WBC/hpf   Bacteria, UA NONE SEEN NONE SEEN   Squamous Epithelial / LPF NONE SEEN 0 - 5    Comment: Performed at [facility], [address] [place], [place] [address]  HIV Antibody (routine testing w rflx)     Status: None   Collection Time: 10/18/19  6:32 AM  Result Value Ref Range   HIV Screen 4th Generation wRfx Non Reactive Non Reactive    Comment: Performed at [facility], [place] [address] [place], [place] [id number]  Basic metabolic panel     Status: Abnormal    Collection Time: 10/18/19  6:32 AM  Result Value Ref Range   Sodium 129 (L) 135 - 145 mmol/L   Potassium 4.1 3.5 - 5.1 mmol/L   Chloride 91 (L) 98 - 111 mmol/L   CO2 29 22 - 32 mmol/L   Glucose, Bld 87 70 - 99 mg/dL    Comment: Glucose reference range applies only to samples taken after fasting for at least 8 hours.   BUN 6 (L) 8 - 23 mg/dL   Creatinine, Ser 0.78 0.61 - 1.24 mg/dL   Calcium 8.7 (L) 8.9 - 10.3 mg/dL   GFR calc non Af Amer >60 >60 mL/min   GFR calc Af Amer >60 >60 mL/min   Anion gap 9 5 - 15    Comment: Performed at [facility], [address] [place], [place] [address]  CBC     Status: Abnormal   Collection Time: 10/18/19  6:32 AM  Result Value Ref Range   WBC 10.3 4.0 - 10.5 K/uL   RBC 3.23 (L) 4.22 - 5.81 MIL/uL   Hemoglobin 9.5 (L) 13.0 - 17.0 g/dL   HCT 28.8 (L) 39.0 - 52.0 %   MCV 89.2 80.0 - 100.0 fL   MCH 29.4 26.0 - 34.0 pg   MCHC 33.0 30.0 - 36.0 g/dL   RDW 13.0 11.5 - 15.5 %   Platelets 375 150 - 400 K/uL   nRBC 0.0 0.0 - 0.2 %    Comment: Performed at [facility], [address], [place], [place] [address]    DG Chest 2 View  Result Date: 10/17/2019 CLINICAL DATA:  Suspected sepsis EXAM: CHEST - 2 VIEW COMPARISON:  06/12/2019 FINDINGS: Left-sided pacing device as before. No focal opacity, pleural effusion, or pneumothorax. Stable mild cardiomegaly with aortic atherosclerosis. No pneumothorax. Hyperinflation with emphysematous disease IMPRESSION: No active cardiopulmonary disease. Hyperinflation with emphysematous disease Electronically Signed   By: Donavan Foil M.D.   On: 10/17/2019 18:21   US Venous Img Lower Unilateral Left  Addendum Date: 10/18/2019   ADDENDUM REPORT: 10/18/2019 01:53 ADDENDUM: These results were called by telephone at the time of interpretation on 10/18/2019 at 1:53 am to provider [facility] , who verbally acknowledged these results. Electronically Signed   By: Lovena Le M.D.   On: 10/18/2019 01:53    Result Date: 10/18/2019 CLINICAL DATA:  Pain and swelling the left leg, history of CHF EXAM: LEFT LOWER EXTREMITY VENOUS DOPPLER ULTRASOUND TECHNIQUE: Gray-scale sonography with compression, as well as color and duplex ultrasound, were performed to evaluate the deep venous system(s) from the level of the common femoral vein through the popliteal and proximal calf veins. COMPARISON:  None. FINDINGS: VENOUS There is hypoechoic incompletely compressible, nonocclusive, peripherally marginated filling defects likely reflecting thrombus in the left common femoral, femoral and popliteal vein as well  as the posterior tibial and peroneal veins of the calf. Normal direction of color venous flow. Normal preservation of the respiratory phasicity. Limited views of the contralateral common femoral vein are unremarkable. OTHER Likely surgical absence of the left greater saphenous vein. Limitations: none IMPRESSION: Incompletely compressible, nonocclusive thrombus seen throughout the deep venous system of the left lower extremity. A peripherally marginated hypoechoic appearance may suggest chronicity though acute thrombus is not excluded. Surgical absence of left greater saphenous vein. Currently attempting to contact the ordering provider with a critical value result. Addendum will be submitted upon case discussion. Chest Electronically Signed: By: Lovena Le M.D. On: 10/18/2019 01:46   DG Foot Complete Left  Result Date: 10/17/2019 CLINICAL DATA:  Left foot pain. Technologist notes state gangrenous foot. Concern for osteomyelitis. Two toes amputated 10 days ago. EXAM: LEFT FOOT - COMPLETE 3+ VIEW COMPARISON:  None. FINDINGS: Resection of the first, second, and third digit. The first, second, and third metatarsal heads are eroded. There is adjacent soft tissue edema and mottled gas in the soft tissues. No evidence of osteomyelitis of the in situ fourth and fifth digit. There is generalized soft tissue edema. Advanced  vascular calcifications. IMPRESSION: 1. 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 2. Soft tissue edema with mottled gas in the soft tissues suspicious for soft tissue infection. 3. Generalized soft tissue edema. Advanced vascular calcifications. Electronically Signed   By: Keith Rake M.D.   On: 10/17/2019 23:42    Review of Systems  Constitutional: Negative for chills and fever.  HENT: Negative for congestion and sore throat.   Respiratory: Negative for cough and shortness of breath.   Cardiovascular: Negative for chest pain and palpitations.  Gastrointestinal: Negative for nausea and vomiting.  Endocrine: Negative for polydipsia and polyuria.  Genitourinary: Negative for dysuria and hematuria.  Musculoskeletal: Negative for myalgias.       Previous above-knee amputation on the right lower extremity.  Recent increase in pain in the left foot.  Skin:       Drainage from the amputation sites on the left foot with some increased redness and swelling.  Neurological: Negative for dizziness and numbness.  Psychiatric/Behavioral: Negative for behavioral problems. The patient is not nervous/anxious.    Blood pressure (!) 99/51, pulse 74, temperature 98.1 F (36.7 C), temperature source Oral, resp. rate 15, height 6\' 1"  (1.854 m), weight 74.8 kg, SpO2 100 %. Physical Exam  Cardiovascular:  DP and PT pulses are palpable on the left foot.  Musculoskeletal:     Comments: Previous above-knee amputation on the right.  Previous amputation toes 1 2 and 3 on the left foot.  Neurological:  Protective threshold with a monofilament wire intact and symmetric on the left foot.  Skin:  Significant erythema and edema in the left foot.  Necrosis noted along the incision line at the amputation site of toes 1 through 3 with some dorsal necrosis as well.  Some moderate purulent drainage noted with some greenish discoloration consistent with Pseudomonas.     Assessment/Plan: Assessment: 1.  Gangrene with osteomyelitis left forefoot. 2.  Severe peripheral vascular disease. 3.  Cellulitis with abscess left foot.  Plan: Multiple sutures from the left foot were removed to allow for drainage.  A deep culture was obtained for sensitivities.  The open areas of the wound on the left foot were packed with gauze and covered with bulky dressing.  Discussed with the patient the need for transmetatarsal amputation of the left  foot as previously discussed by Dr. Vickki Muff as an attempt for limb salvage.  Discussed with the patient risks and complications of the procedure mostly involving the extent of infection and that there is a high likelihood that he will need a higher amputation.  At this point we will plan for surgery on Friday morning, potentially moving this up if needed depending how he is doing clinically.  Durward Fortes 10/18/2019, 1:34 PM

## 2019-10-18 NOTE — Telephone Encounter (Signed)
T/c to offer palliative visit, pt is in hospital. Will alert liaisons.

## 2019-10-18 NOTE — ED Notes (Signed)
Pt given meal tray.

## 2019-10-19 LAB — CREATININE, SERUM
Creatinine, Ser: 0.74 mg/dL (ref 0.61–1.24)
GFR calc Af Amer: >60 mL/min (ref >60)
GFR calc non Af Amer: >60 mL/min (ref >60)

## 2019-10-19 MED ORDER — MORPHINE SULFATE (PF) 4 MG/ML IV SOLN
4.0000 mg | INTRAVENOUS | Status: DC | PRN
  Administered 2019-10-19 – 2019-10-20 (×4): 4 mg via INTRAVENOUS
  Filled 2019-10-19 (×4): qty 1

## 2019-10-19 MED ORDER — VANCOMYCIN HCL 750 MG/150ML IV SOLN
750.0000 mg | Freq: Three times a day (TID) | INTRAVENOUS | Status: DC
  Administered 2019-10-19 – 2019-10-22 (×8): 750 mg via INTRAVENOUS
  Filled 2019-10-19 (×11): qty 150

## 2019-10-19 NOTE — Anesthesia Preprocedure Evaluation (Addendum)
Anesthesia Evaluation  Patient identified by MRN, date of birth, ID band Patient awake    Reviewed: Allergy & Precautions, H&P , NPO status , Patient's Chart, lab work & pertinent test results  Airway Mallampati: I  TM Distance: >3 FB Neck ROM: limited    Dental  (+) Chipped, Missing   Pulmonary shortness of breath, with exertion, at rest, lying and Long-Term Oxygen Therapy, COPD,  COPD inhaler and oxygen dependent, Patient abstained from smoking., former smoker,  Wears 2-3L O2 ATC    + decreased breath sounds+ wheezing      Cardiovascular hypertension, (-) angina+ CAD, + Past MI, + Cardiac Stents, + Peripheral Vascular Disease, +CHF, + Orthopnea and + DOE  + dysrhythmias Atrial Fibrillation + pacemaker + Cardiac Defibrillator  Rhythm:regular Rate:Normal  Echo 12/19/18: MODERATE LV SYSTOLIC DYSFUNCTION WITH AN ESTIMATED EF = 30 % NORMAL RIGHT VENTRICULAR SYSTOLIC FUNCTION MODERATE-TO-SEVERE MITRAL AND TRICUSPID VALVE INSUFFICIENCY NO VALVULAR STENOSIS MODERATE BIATRIAL ENLARGEMENT MILD LV ENLARGEMENT MILD RV ENLARGEMENT PACER WIRE NOTED   Neuro/Psych PSYCHIATRIC DISORDERS Anxiety Depression Cervical spinal stenosis    GI/Hepatic Neg liver ROS, GERD  ,  Endo/Other  negative endocrine ROS  Renal/GU negative Renal ROS  negative genitourinary   Musculoskeletal   Abdominal   Peds  Hematology negative hematology ROS (+)   Anesthesia Other Findings Past Medical History: No date: AICD (automatic cardioverter/defibrillator) present No date: Anxiety No date: Arthritis 09/2019: Atherosclerosis of artery of extremity with ulceration ([facility])     Comment:  left foot s/p toe amp requiring debridement and futher               toe amputations. No date: Atrial fibrillation ([facility]) No date: Cervical spinal stenosis     Comment:  with neuropathy No date: CHF (congestive heart failure) ([facility]) No date: Constipation No date: COPD  (chronic obstructive pulmonary disease) ([facility]) No date: Coronary artery disease No date: Depression No date: Dyspnea No date: Dysrhythmia     Comment:  atrial fibrillation No date: Emphysema of lung ([facility]) No date: GERD (gastroesophageal reflux disease) No date: Hypertension No date: Lung nodule seen on imaging study     Comment:  being followed by dr. [name]. just watching it for last               few years, without change 2004: Myocardial infarction [facility])     Comment:  stent placed, pacemaker implanted 2005 No date: Oxygen dependent     Comment:  requires 2L nasal prong oxygen 24 hours a day No date: Peripheral vascular disease ([place]) [id number]: Presence of permanent cardiac pacemaker  Past Surgical History: No date: ABOVE KNEE LEG AMPUTATION; Right     Comment:  after below the knee amputation  08/04/2019: AMPUTATION TOE; Left     Comment:  Procedure: AMPUTATION TOE MPJ LEFT;  Surgeon: Samara Deist, DPM;  Location: [facility];  Service: Podiatry;                Laterality: Left; 10/06/2019: AMPUTATION TOE; Left     Comment:  Procedure: AMPUTATION TOE MPJ T1,T2 LEFT;  Surgeon:               Samara Deist, DPM;  Location: [facility];  Service:               Podiatry;  Laterality: Left; [id number]: APPLICATION OF WOUND VAC; Left     Comment:  Procedure: APPLICATION OF  WOUND VAC;  Surgeon: Algernon Huxley, MD;  Location: [facility];  Service: Vascular;                Laterality: Left; No date: BELOW KNEE LEG AMPUTATION; Right No date: CATARACT EXTRACTION, BILATERAL; Bilateral 08/11/2017: ENDARTERECTOMY FEMORAL; Left     Comment:  Procedure: ENDARTERECTOMY FEMORAL;  Surgeon: Algernon Huxley, MD;  Location: [facility];  Service: Vascular;                Laterality: Left; 01/12/2018: HEMATOMA EVACUATION; Left     Comment:  Procedure: EVACUATION HEMATOMA ( DRAINING OF SEROMA);                Surgeon: Algernon Huxley, MD;  Location: [facility];  Service:               Vascular;  Laterality: Left; 02/10/2017: IMPLANTABLE CARDIOVERTER DEFIBRILLATOR (ICD) GENERATOR  CHANGE; Left     Comment:  Procedure: ICD GENERATOR CHANGE;  Surgeon: Isaias Cowman, MD;  Location: [facility];  Service:               Cardiovascular;  Laterality: Left; [id number]: INSERT / REPLACE / REMOVE PACEMAKER 06/07/2017: LOWER EXTREMITY ANGIOGRAPHY; Left     Comment:  Procedure: LOWER EXTREMITY ANGIOGRAPHY;  Surgeon: Algernon Huxley, MD;  Location: [place] [facility];  Service:               Cardiovascular;  Laterality: Left; 10/05/2019: LOWER EXTREMITY ANGIOGRAPHY; Left     Comment:  Procedure: LOWER EXTREMITY ANGIOGRAPHY;  Surgeon: Algernon Huxley, MD;  Location: [place] [facility];  Service:               Cardiovascular;  Laterality: Left; 06/07/2017: LOWER EXTREMITY INTERVENTION     Comment:  Procedure: LOWER EXTREMITY INTERVENTION;  Surgeon: Algernon Huxley, MD;  Location: [place] [facility];  Service:               Cardiovascular;; 12/09/2015: PERIPHERAL VASCULAR CATHETERIZATION; Left     Comment:  Procedure: Lower Extremity Angiography;  Surgeon: Algernon Huxley, MD;  Location: [place] [facility];  Service:               Cardiovascular;  Laterality: Left; 12/09/2015: PERIPHERAL VASCULAR CATHETERIZATION     Comment:  Procedure: Lower Extremity Intervention;  Surgeon: Algernon Huxley, MD;  Location: [place] [facility];  Service:               Cardiovascular;; No date: TONSILLECTOMY  BMI    Body Mass Index: 21.77 kg/m      Reproductive/Obstetrics negative OB ROS                            Anesthesia Physical Anesthesia Plan  ASA: IV  Anesthesia  Plan: General   Post-op Pain Management:    Induction:   PONV Risk Score and Plan:   Airway Management Planned:   Additional Equipment:   Intra-op Plan:   Post-operative Plan:   Informed Consent: I have  reviewed the patients History and Physical, chart, labs and discussed the procedure including the risks, benefits and alternatives for the proposed anesthesia with the patient or authorized representative who has indicated his/her understanding and acceptance.     Dental Advisory Given  Plan Discussed with: Anesthesiologist, CRNA and Surgeon  Anesthesia Plan Comments:         Anesthesia Quick Evaluation

## 2019-10-19 NOTE — Progress Notes (Signed)
Pharmacy Antibiotic Note  Tyler [name] is a 64 y.o. male admitted on 10/17/2019 with cellulitis and osteomyelitis of the left foot.  Pharmacy has been consulted for Vancomycin and Zosyn dosing.  Patient underwent left big toe amputation on 3/15, and had second and third toes amputated on 5/14.  Transmetatarsal amputation planned for 5/28.    Plan: Zosyn Continue Zosyn 3.375 g IV q 8 hours  Vancomycin Patient received 2 g loading dose.  Will adjust maintenance dose to 750 mg IV q 8 hrs based on [facility] Nom[facility].  Will dose to goal trough of 15-20.  Anticipated Css min 15.3.  Pharmacy will continue to monitor renal function, and will obtain vancomycin levels as clinically indicated.   Height: 6\' 1"  (185.4 cm) Weight: 74.8 kg (165 lb) IBW/kg (Calculated) : 79.9  Temp (24hrs), Avg:97.8 F (36.6 C), Min:97.6 F (36.4 C), Max:98.1 F (36.7 C)  Recent Labs  Lab 10/17/19 1720 10/17/19 1920 10/18/19 0632 10/19/19 0904  WBC 10.2  --  10.3  --   CREATININE 0.71  --  0.78 0.74  LATICACIDVEN 0.9 1.2  --   --     Estimated Creatinine Clearance: 98.7 mL/min (by C-G formula based on SCr of 0.74 mg/dL).    Allergies  Allergen Reactions  . Apixaban Rash  . Rivaroxaban Rash    Antimicrobials this admission:  5/25 Ceftriaxone x 1 5/26 Zosyn >>  5/25 Vancomycin >>  Dose adjustments this admission: Vancomycin 1250 mg q 12 hr >> 750 mg q 8 hr  Microbiology results: 5/26 WCx (foot abscess) Abundant Pseudomonas (pending) 5/26 MRSA PCR sent 5/26 COVID (nasopharyngeal swab) negative 5/25 BCx NG x 2   Thank you for allowing pharmacy to be a part of this patient's care.  Gerald Dexter, PharmD 10/19/2019 5:03 PM

## 2019-10-19 NOTE — Progress Notes (Signed)
Subjective/Chief Complaint: Patient seen.  Still complains of a lot of pain in his left foot.  Difficulty sleeping at this point.   Objective: Vital signs in last 24 hours: Temp:  [97.6 F (36.4 C)-98.1 F (36.7 C)] 97.6 F (36.4 C) (05/27 0716) Pulse Rate:  [74-79] 75 (05/27 0716) Resp:  [14-23] 17 (05/27 0716) BP: (93-124)/(48-80) 93/67 (05/27 0716) SpO2:  [98 %-100 %] 100 % (05/27 0716) Last BM Date: 10/18/19  Intake/Output from previous day: 05/26 0701 - 05/27 0700 In: 1881.1 [I.V.:1517.9; IV Piggyback:363.2] Out: 300 [Urine:300] Intake/Output this shift: Total I/O In: 1004.6 [P.O.:240; I.V.:712.7; IV Piggyback:51.9] Out: 550 [Urine:550]  Heavy drainage is noted on the bandaging on the left foot.  Upon removal continued necrosis on the distal aspect of the left foot with significant malodor noted today which was not noted yesterday.  Continued erythema in the forefoot with edema of the left lower extremity.  Lab Results:  Recent Labs    10/17/19 1720 10/18/19 0632  WBC 10.2 10.3  HGB 10.0* 9.5*  HCT 30.4* 28.8*  PLT 362 375   BMET Recent Labs    10/17/19 1720 10/17/19 1720 10/18/19 0632 10/19/19 0904  NA 126*  --  129*  --   K 4.7  --  4.1  --   CL 88*  --  91*  --   CO2 29  --  29  --   GLUCOSE 102*  --  87  --   BUN 6*  --  6*  --   CREATININE 0.71   < > 0.78 0.74  CALCIUM 8.9  --  8.7*  --    < > = values in this interval not displayed.   PT/INR Recent Labs    10/17/19 1720  LABPROT 24.5*  INR 2.3*   ABG No results for input(s): PHART, HCO3 in the last 72 hours.  Invalid input(s): PCO2, PO2  Studies/Results: DG Chest 2 View  Result Date: 10/17/2019 CLINICAL DATA:  Suspected sepsis EXAM: CHEST - 2 VIEW COMPARISON:  06/12/2019 FINDINGS: Left-sided pacing device as before. No focal opacity, pleural effusion, or pneumothorax. Stable mild cardiomegaly with aortic atherosclerosis. No pneumothorax. Hyperinflation with emphysematous disease  IMPRESSION: No active cardiopulmonary disease. Hyperinflation with emphysematous disease Electronically Signed   By: Donavan Foil M.D.   On: 10/17/2019 18:21   US Venous Img Lower Unilateral Left  Addendum Date: 10/18/2019   ADDENDUM REPORT: 10/18/2019 01:53 ADDENDUM: These results were called by telephone at the time of interpretation on 10/18/2019 at 1:53 am to provider [facility] , who verbally acknowledged these results. Electronically Signed   By: Lovena Le M.D.   On: 10/18/2019 01:53   Result Date: 10/18/2019 CLINICAL DATA:  Pain and swelling the left leg, history of CHF EXAM: LEFT LOWER EXTREMITY VENOUS DOPPLER ULTRASOUND TECHNIQUE: Gray-scale sonography with compression, as well as color and duplex ultrasound, were performed to evaluate the deep venous system(s) from the level of the common femoral vein through the popliteal and proximal calf veins. COMPARISON:  None. FINDINGS: VENOUS There is hypoechoic incompletely compressible, nonocclusive, peripherally marginated filling defects likely reflecting thrombus in the left common femoral, femoral and popliteal vein as well as the posterior tibial and peroneal veins of the calf. Normal direction of color venous flow. Normal preservation of the respiratory phasicity. Limited views of the contralateral common femoral vein are unremarkable. OTHER Likely surgical absence of the left greater saphenous vein. Limitations: none IMPRESSION: Incompletely compressible, nonocclusive thrombus seen throughout the deep  venous system of the left lower extremity. A peripherally marginated hypoechoic appearance may suggest chronicity though acute thrombus is not excluded. Surgical absence of left greater saphenous vein. Currently attempting to contact the ordering provider with a critical value result. Addendum will be submitted upon case discussion. Chest Electronically Signed: By: Lovena Le M.D. On: 10/18/2019 01:46   DG Foot Complete Left  Result Date:  10/17/2019 CLINICAL DATA:  Left foot pain. Technologist notes state gangrenous foot. Concern for osteomyelitis. Two toes amputated 10 days ago. EXAM: LEFT FOOT - COMPLETE 3+ VIEW COMPARISON:  None. FINDINGS: Resection of the first, second, and third digit. The first, second, and third metatarsal heads are eroded. There is adjacent soft tissue edema and mottled gas in the soft tissues. No evidence of osteomyelitis of the in situ fourth and fifth digit. There is generalized soft tissue edema. Advanced vascular calcifications. IMPRESSION: 1. 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 2. Soft tissue edema with mottled gas in the soft tissues suspicious for soft tissue infection. 3. Generalized soft tissue edema. Advanced vascular calcifications. Electronically Signed   By: Keith Rake M.D.   On: 10/17/2019 23:42    Anti-infectives: Anti-infectives (From admission, onward)   Start     Dose/Rate Route Frequency Ordered Stop   10/18/19 1800  vancomycin (VANCOREADY) IVPB 1250 mg/250 mL     1,250 mg 166.7 mL/hr over 90 Minutes Intravenous Every 12 hours 10/18/19 0355     10/18/19 1100  piperacillin-tazobactam (ZOSYN) IVPB 3.375 g     3.375 g 12.5 mL/hr over 240 Minutes Intravenous Every 8 hours 10/18/19 0352     10/18/19 0230  vancomycin (VANCOCIN) IVPB 1000 mg/200 mL premix     1,000 mg 200 mL/hr over 60 Minutes Intravenous  Once 10/18/19 0154 10/18/19 0320   10/18/19 0200  piperacillin-tazobactam (ZOSYN) IVPB 3.375 g     3.375 g 100 mL/hr over 30 Minutes Intravenous  Once 10/18/19 0154 10/18/19 0412   10/17/19 2330  vancomycin (VANCOCIN) IVPB 1000 mg/200 mL premix     1,000 mg 200 mL/hr over 60 Minutes Intravenous  Once 10/17/19 2321 10/18/19 0057   10/17/19 2330  cefTRIAXone (ROCEPHIN) 2 g in sodium chloride 0.9 % 100 mL IVPB     2 g 200 mL/hr over 30 Minutes Intravenous  Once 10/17/19 2321 10/18/19 0057      Assessment/Plan: s/p  Procedure(s): TRANSMETATARSAL AMPUTATION (Left) Assessment: Gangrene with osteomyelitis left forefoot.  Peripheral vascular disease.   Plan: Discussed with the patient the need for amputation which is scheduled for tomorrow.  Discussed risk and complications of the procedure and anesthesia including but not limited to inability of the wound to heal due to his circulation or significant remaining infection.  Discussed that he is still at significant risk for limb loss.  Understands no guarantees can be given as to the outcome.  Obtain consent for transmetatarsal amputation left foot.  N.p.o. after midnight.  Plan for surgery tomorrow morning.  LOS: 1 day    Tyler [name] 10/19/2019

## 2019-10-19 NOTE — Progress Notes (Signed)
PROGRESS NOTE    Tyler [name]  [id number] DOB: July 22, 1955 DOA: 10/17/2019 PCP: [name], MD   Brief Narrative:  Tyler [name]  is a 64 y.o. Caucasian male with a known history of CHF, COPD, coronary disease, atrial fibrillation, hypertension and peripheral vascular disease, status post AICD, presented to emergency room with acute onset of worsening left foot swelling with erythema and discharge.  Patient had his left big toe amputated on 3/15 by Dr. Vickki Muff and later on had his second and third toes amputated on 5/14.  His erythema has been extending to his left flank.  No fever or chills, nausea or vomiting or abdominal pain.  No chest pain or palpitations.  No dysuria, oliguria or hematuria or flank pain. Started on broad-spectrum antibiotics and podiatry was consulted.  They are planning to do transmetatarsal amputation.  Subjective: Patient has no new complaints today.  Sister was present in the room.  Waiting for surgery tomorrow.  Assessment & Plan:   Active Problems:   Osteomyelitis of left leg (HCC)  Left lower extremity severe nonpurulent cellulitis with associated left foot osteomyelitis and gangrene with purulent cellulitis. Podiatry is following-going for TMA on Friday. -Continue vancomycin and Zosyn. -Would like to go back home with maximum home health services if possible after surgery.  Hypertension. -We will continue Entresto to.   Anxiety and depression. -We will continue Celexa and Xanax.    Peripheral neuropathy. -We will continue Neurontin.    BPH. -We will continue Flomax.   COPD without current exacerbation. -We will continue his Advair Diskus and Atrovent.   Objective: Vitals:   10/19/19 0716 10/19/19 1537 10/19/19 1553 10/19/19 1701  BP: 93/67  111/60   Pulse: 75  74 75  Resp: 17  14   Temp: 97.6 F (36.4 C)  97.7 F (36.5 C)   TempSrc:   Oral   SpO2: 100% 98% (!) 88% 100%  Weight:      Height:        Intake/Output  Summary (Last 24 hours) at 10/19/2019 1906 Last data filed at 10/19/2019 1809 Gross per 24 hour  Intake 2885.67 ml  Output 850 ml  Net 2035.67 ml   Filed Weights   10/17/19 1715  Weight: 74.8 kg    Examination:  General exam: Appears calm and comfortable  Respiratory system: Clear to auscultation. Respiratory effort normal. Cardiovascular system: S1 & S2 heard, RRR. No JVD, murmurs, rubs, gallops or clicks. Gastrointestinal system: Soft, nontender, nondistended, bowel sounds positive. Central nervous system: Alert and oriented. No focal neurological deficits.Symmetric 5 x 5 power. Extremities: Right AKA, left foot with clean bandage. Skin: No rashes, lesions or ulcers Psychiatry: Judgement and insight appear normal. Mood & affect appropriate.       DVT prophylaxis: SCDs Code Status: Full Family Communication: Sister was updated at bedside. Disposition Plan:  Status is: Inpatient   Remains inpatient appropriate because:Inpatient level of care appropriate due to severity of illness   Dispo: The patient is from: Home              Anticipated d/c is to: Home              Anticipated d/c date is: 3 days              Patient currently is not medically stable to d/c.   Consultants:   Podiatry  Procedures:  Antimicrobials: Vancomycin Zosyn  Data Reviewed: I have personally reviewed following labs and imaging studies  CBC: Recent  Labs  Lab 10/17/19 1720 10/18/19 0632  WBC 10.2 10.3  NEUTROABS 7.6  --   HGB 10.0* 9.5*  HCT 30.4* 28.8*  MCV 87.6 89.2  PLT 362 [id number]   Basic Metabolic Panel: Recent Labs  Lab 10/17/19 1720 10/18/19 0632 10/19/19 0904  NA 126* 129*  --   K 4.7 4.1  --   CL 88* 91*  --   CO2 29 29  --   GLUCOSE 102* 87  --   BUN 6* 6*  --   CREATININE 0.71 0.78 0.74  CALCIUM 8.9 8.7*  --    GFR: Estimated Creatinine Clearance: 98.7 mL/min (by C-G formula based on SCr of 0.74 mg/dL). Liver Function Tests: Recent Labs  Lab 10/17/19 1720    AST 23  ALT 14  ALKPHOS 69  BILITOT 0.5  PROT 6.7  ALBUMIN 3.3*   No results for input(s): LIPASE, AMYLASE in the last 168 hours. No results for input(s): AMMONIA in the last 168 hours. Coagulation Profile: Recent Labs  Lab 10/17/19 1720  INR 2.3*   Cardiac Enzymes: No results for input(s): CKTOTAL, CKMB, CKMBINDEX, TROPONINI in the last 168 hours. BNP (last 3 results) No results for input(s): PROBNP in the last 8760 hours. HbA1C: No results for input(s): HGBA1C in the last 72 hours. CBG: No results for input(s): GLUCAP in the last 168 hours. Lipid Profile: No results for input(s): CHOL, HDL, LDLCALC, TRIG, CHOLHDL, LDLDIRECT in the last 72 hours. Thyroid Function Tests: No results for input(s): TSH, T4TOTAL, FREET4, T3FREE, THYROIDAB in the last 72 hours. Anemia Panel: No results for input(s): VITAMINB12, FOLATE, FERRITIN, TIBC, IRON, RETICCTPCT in the last 72 hours. Sepsis Labs: Recent Labs  Lab 10/17/19 1720 10/17/19 1920  LATICACIDVEN 0.9 1.2    Recent Results (from the past 240 hour(s))  Culture, blood (Routine x 2)     Status: None (Preliminary result)   Collection Time: 10/17/19  5:20 PM   Specimen: BLOOD  Result Value Ref Range Status   Specimen Description BLOOD RIGHT ANTECUBITAL  Final   Special Requests   Final    BOTTLES DRAWN AEROBIC AND ANAEROBIC Blood Culture adequate volume   Culture   Final    NO GROWTH 2 DAYS Performed at [facility], [address], [place], [place] [address]    Report Status PENDING  Incomplete  Culture, blood (Routine x 2)     Status: None (Preliminary result)   Collection Time: 10/17/19  5:25 PM   Specimen: BLOOD LEFT HAND  Result Value Ref Range Status   Specimen Description BLOOD LEFT HAND  Final   Special Requests   Final    BOTTLES DRAWN AEROBIC AND ANAEROBIC Blood Culture adequate volume   Culture   Final    NO GROWTH 1 DAY Performed at [facility], [address], [place], [place]  [address]    Report Status PENDING  Incomplete  Aerobic/Anaerobic Culture (surgical/deep wound)     Status: None (Preliminary result)   Collection Time: 10/18/19  1:05 PM   Specimen: Foot; Abscess  Result Value Ref Range Status   Specimen Description   Final    FOOT LEFT FOOT Performed at [facility], [address], [place], [place] [address]    Special Requests   Final    NONE Performed at [facility], [address] [place], [place] [address]    Gram Stain   Final    ABUNDANT WBC PRESENT, PREDOMINANTLY PMN ABUNDANT GRAM POSITIVE COCCI  ABUNDANT GRAM NEGATIVE RODS    Culture   Final    ABUNDANT PSEUDOMONAS AERUGINOSA CULTURE REINCUBATED FOR BETTER GROWTH Performed at [facility], [address], [place], [place] [address]    Report Status PENDING  Incomplete  SARS Coronavirus 2 by RT PCR (hospital order, performed in [facility] hospital lab) Nasopharyngeal Nasopharyngeal Swab     Status: None   Collection Time: 10/18/19  3:54 PM   Specimen: Nasopharyngeal Swab  Result Value Ref Range Status   SARS Coronavirus 2 NEGATIVE NEGATIVE Final    Comment: (NOTE) SARS-CoV-2 target nucleic acids are NOT DETECTED. The SARS-CoV-2 RNA is generally detectable in upper and lower respiratory specimens during the acute phase of infection. The lowest concentration of SARS-CoV-2 viral copies this assay can detect is 250 copies / mL. A negative result does not preclude SARS-CoV-2 infection and should not be used as the sole basis for treatment or other patient management decisions.  A negative result may occur with improper specimen collection / handling, submission of specimen other than nasopharyngeal swab, presence of viral mutation(s) within the areas targeted by this assay, and inadequate number of viral copies (<250 copies / mL). A negative result must be combined with clinical observations, patient history, and epidemiological information. Fact Sheet for  Patients:   StrictlyIdeas.no Fact Sheet for Healthcare Providers: BankingDealers.co.za This test is not yet approved or cleared  by the [place] FDA and has been authorized for detection and/or diagnosis of SARS-CoV-2 by FDA under an Emergency Use Authorization (EUA).  This EUA will remain in effect (meaning this test can be used) for the duration of the COVID-19 declaration under Section 564(b)(1) of the Act, 21 U.S.C. section 360bbb-3(b)(1), unless the authorization is terminated or revoked sooner. Performed at [facility], [address], [place], [place] [address]      Radiology Studies: US Venous Img Lower Unilateral Left  Addendum Date: 10/18/2019   ADDENDUM REPORT: 10/18/2019 01:53 ADDENDUM: These results were called by telephone at the time of interpretation on 10/18/2019 at 1:53 am to provider [facility] , who verbally acknowledged these results. Electronically Signed   By: Lovena Le M.D.   On: 10/18/2019 01:53   Result Date: 10/18/2019 CLINICAL DATA:  Pain and swelling the left leg, history of CHF EXAM: LEFT LOWER EXTREMITY VENOUS DOPPLER ULTRASOUND TECHNIQUE: Gray-scale sonography with compression, as well as color and duplex ultrasound, were performed to evaluate the deep venous system(s) from the level of the common femoral vein through the popliteal and proximal calf veins. COMPARISON:  None. FINDINGS: VENOUS There is hypoechoic incompletely compressible, nonocclusive, peripherally marginated filling defects likely reflecting thrombus in the left common femoral, femoral and popliteal vein as well as the posterior tibial and peroneal veins of the calf. Normal direction of color venous flow. Normal preservation of the respiratory phasicity. Limited views of the contralateral common femoral vein are unremarkable. OTHER Likely surgical absence of the left greater saphenous vein. Limitations: none IMPRESSION:  Incompletely compressible, nonocclusive thrombus seen throughout the deep venous system of the left lower extremity. A peripherally marginated hypoechoic appearance may suggest chronicity though acute thrombus is not excluded. Surgical absence of left greater saphenous vein. Currently attempting to contact the ordering provider with a critical value result. Addendum will be submitted upon case discussion. Chest Electronically Signed: By: Lovena Le M.D. On: 10/18/2019 01:46   DG Foot Complete Left  Result Date: 10/17/2019 CLINICAL DATA:  Left foot pain. Technologist notes state gangrenous foot. Concern  for osteomyelitis. Two toes amputated 10 days ago. EXAM: LEFT FOOT - COMPLETE 3+ VIEW COMPARISON:  None. FINDINGS: Resection of the first, second, and third digit. The first, second, and third metatarsal heads are eroded. There is adjacent soft tissue edema and mottled gas in the soft tissues. No evidence of osteomyelitis of the in situ fourth and fifth digit. There is generalized soft tissue edema. Advanced vascular calcifications. IMPRESSION: 1. 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 2. Soft tissue edema with mottled gas in the soft tissues suspicious for soft tissue infection. 3. Generalized soft tissue edema. Advanced vascular calcifications. Electronically Signed   By: Keith Rake M.D.   On: 10/17/2019 23:42    Scheduled Meds: . aspirin EC  81 mg Oral QPC supper  . citalopram  10 mg Oral Daily  . docusate sodium  100 mg Oral Daily  . fluticasone  2 spray Each Nare Daily  . furosemide  20 mg Oral Daily  . gabapentin  300 mg Oral BID  . guaiFENesin  600 mg Oral QPM  . ipratropium  2 spray Each Nare BID  . Ipratropium-Albuterol  1 puff Inhalation Q4H  . loratadine  10 mg Oral QHS  . metoprolol succinate  25 mg Oral Daily  . mometasone-formoterol  2 puff Inhalation BID  . pantoprazole  40 mg Oral QAC breakfast  . pravastatin  20 mg Oral  q1800  . Restore Wound Care Dressing  1 each Topical Daily  . sacubitril-valsartan  1 tablet Oral BID  . tamsulosin  0.4 mg Oral QPC supper  . tiotropium  18 mcg Inhalation Daily  . vitamin B-12  1,000 mcg Oral Daily   Continuous Infusions: . sodium chloride 100 mL/hr at 10/19/19 1125  . piperacillin-tazobactam (ZOSYN)  IV 3.375 g (10/19/19 1805)  . vancomycin 750 mg (10/19/19 1809)     LOS: 1 day   Time spent: 40 minutes  Lorella Nimrod, MD [facility]  If 7PM-7AM, please contact night-coverage [email or web address]  10/19/2019, 7:06 PM   This record has been created using Systems analyst. Errors have been sought and corrected,but may not always be located. Such creation errors do not reflect on the standard of care.

## 2019-10-19 NOTE — TOC Progression Note (Signed)
Transition of Care [facility]) - Progression Note    Patient Details  Name: Tyler [name] MRN: [id number] Date of Birth: 11-18-1955  Transition of Care [facility]) CM/SW [place], RN Phone Number: 10/19/2019, 8:31 AM  Clinical Narrative:     The patient is from Home, he had a Toe amputation on 3/15 and another on 5/14, he is readmitted for a possible Transmetatarsal amputation, After surgery PT will eval and make recommendations, TOC will follow the patient to ensure needs are met before DC       Expected Discharge Plan and Services                                                 Social Determinants of Health (SDOH) Interventions    Readmission Risk Interventions No flowsheet data found.

## 2019-10-19 NOTE — TOC Progression Note (Signed)
Transition of Care [facility]) - Progression Note    Patient Details  Name: Tyler [name] MRN: [id number] Date of Birth: 04/10/1956  Transition of Care [facility]) CM/SW [place], RN Phone Number: 10/19/2019, 3:58 PM  Clinical Narrative:     Santiago Glad with [facility] notified me that the patient is a current Palliative patient being followed by [facility]       Expected Discharge Plan and Services                                                 Social Determinants of Health (SDOH) Interventions    Readmission Risk Interventions No flowsheet data found.

## 2019-10-19 NOTE — Plan of Care (Signed)

## 2019-10-20 ENCOUNTER — Other Ambulatory Visit: Payer: Self-pay

## 2019-10-20 ENCOUNTER — Inpatient Hospital Stay: Payer: Medicare HMO | Admitting: Anesthesiology

## 2019-10-20 ENCOUNTER — Encounter
Admission: EM | Disposition: A | Discharge status: Home Health | Payer: Self-pay | Source: Home / Self Care | Attending: Internal Medicine

## 2019-10-20 HISTORY — PX: TRANSMETATARSAL AMPUTATION: SHX6197

## 2019-10-20 LAB — BASIC METABOLIC PANEL
Anion gap: 6 (ref 5–15)
BUN: 9 mg/dL (ref 8–23)
CO2: 27 mmol/L (ref 22–32)
Calcium: 8.3 mg/dL — ABNORMAL LOW (ref 8.9–10.3)
Chloride: 94 mmol/L — ABNORMAL LOW (ref 98–111)
Creatinine, Ser: 0.75 mg/dL (ref 0.61–1.24)
GFR calc Af Amer: >60 mL/min (ref >60)
GFR calc non Af Amer: >60 mL/min (ref >60)
Glucose, Bld: 81 mg/dL (ref 70–99)
Potassium: 4.3 mmol/L (ref 3.5–5.1)
Sodium: 127 mmol/L — ABNORMAL LOW (ref 135–145)

## 2019-10-20 LAB — SURGICAL PCR SCREEN
MRSA, PCR: NEGATIVE
Staphylococcus aureus: POSITIVE — AB

## 2019-10-20 SURGERY — AMPUTATION, FOOT, TRANSMETATARSAL
Anesthesia: General | Site: Toe | Laterality: Left

## 2019-10-20 MED ORDER — BUPIVACAINE HCL (PF) 0.5 % IJ SOLN
INTRAMUSCULAR | Status: DC | PRN
  Administered 2019-10-20: 30 mL

## 2019-10-20 MED ORDER — OXYCODONE-ACETAMINOPHEN 7.5-325 MG PO TABS
2.0000 | ORAL_TABLET | Freq: Four times a day (QID) | ORAL | Status: DC | PRN
  Administered 2019-10-20 – 2019-10-24 (×12): 2 via ORAL
  Filled 2019-10-20 (×17): qty 2

## 2019-10-20 MED ORDER — PHENYLEPHRINE HCL (PRESSORS) 10 MG/ML IV SOLN
INTRAVENOUS | Status: DC | PRN
  Administered 2019-10-20 (×7): 100 ug via INTRAVENOUS
  Administered 2019-10-20: 200 ug via INTRAVENOUS
  Administered 2019-10-20: 100 ug via INTRAVENOUS

## 2019-10-20 MED ORDER — ACETAMINOPHEN 10 MG/ML IV SOLN
INTRAVENOUS | Status: DC | PRN
  Administered 2019-10-20: 1000 mg via INTRAVENOUS

## 2019-10-20 MED ORDER — MORPHINE SULFATE (PF) 4 MG/ML IV SOLN
4.0000 mg | INTRAVENOUS | Status: DC | PRN
  Administered 2019-10-20 – 2019-10-21 (×4): 4 mg via INTRAVENOUS
  Filled 2019-10-20 (×4): qty 1

## 2019-10-20 MED ORDER — PHENYLEPHRINE HCL-NACL 10-0.9 MG/250ML-% IV SOLN
INTRAVENOUS | Status: DC | PRN
Start: 2019-10-20 — End: 2019-10-20
  Administered 2019-10-20: 30 ug/min via INTRAVENOUS

## 2019-10-20 MED ORDER — EPHEDRINE SULFATE 50 MG/ML IJ SOLN
INTRAMUSCULAR | Status: DC | PRN
  Administered 2019-10-20: 10 mg via INTRAVENOUS

## 2019-10-20 MED ORDER — FENTANYL CITRATE (PF) 100 MCG/2ML IJ SOLN
INTRAMUSCULAR | Status: AC
  Filled 2019-10-20: qty 2

## 2019-10-20 MED ORDER — CHLORHEXIDINE GLUCONATE 0.12 % MT SOLN: 15.0000 mL | Freq: Once | OROMUCOSAL | Status: DC

## 2019-10-20 MED ORDER — FENTANYL CITRATE (PF) 100 MCG/2ML IJ SOLN
INTRAMUSCULAR | Status: DC | PRN
  Administered 2019-10-20: 100 ug via INTRAVENOUS

## 2019-10-20 MED ORDER — SODIUM CHLORIDE 0.9 % IV SOLN
INTRAVENOUS | Status: DC | PRN
  Administered 2019-10-20 – 2019-10-23 (×7): 250 mL via INTRAVENOUS

## 2019-10-20 MED ORDER — LIDOCAINE HCL (CARDIAC) PF 100 MG/5ML IV SOSY
PREFILLED_SYRINGE | INTRAVENOUS | Status: DC | PRN
  Administered 2019-10-20: 80 mg via INTRAVENOUS

## 2019-10-20 MED ORDER — PIPERACILLIN-TAZOBACTAM 3.375 G IVPB
INTRAVENOUS | Status: AC
  Filled 2019-10-20: qty 50

## 2019-10-20 MED ORDER — MIDAZOLAM HCL 2 MG/2ML IJ SOLN
INTRAMUSCULAR | Status: AC
  Filled 2019-10-20: qty 2

## 2019-10-20 MED ORDER — PROPOFOL 500 MG/50ML IV EMUL
INTRAVENOUS | Status: AC
  Filled 2019-10-20: qty 50

## 2019-10-20 MED ORDER — ONDANSETRON HCL 4 MG/2ML IJ SOLN: 4.0000 mg | Freq: Once | INTRAMUSCULAR | Status: DC | PRN

## 2019-10-20 MED ORDER — ALBUTEROL SULFATE HFA 108 (90 BASE) MCG/ACT IN AERS
INHALATION_SPRAY | RESPIRATORY_TRACT | Status: AC
  Filled 2019-10-20: qty 6.7

## 2019-10-20 MED ORDER — PROPOFOL 10 MG/ML IV BOLUS
INTRAVENOUS | Status: DC | PRN
  Administered 2019-10-20: 30 mg via INTRAVENOUS
  Administered 2019-10-20: 80 mg via INTRAVENOUS
  Administered 2019-10-20: 30 mg via INTRAVENOUS

## 2019-10-20 MED ORDER — ONDANSETRON HCL 4 MG/2ML IJ SOLN
INTRAMUSCULAR | Status: DC | PRN
  Administered 2019-10-20: 4 mg via INTRAVENOUS

## 2019-10-20 MED ORDER — PROPOFOL 10 MG/ML IV BOLUS
INTRAVENOUS | Status: AC
  Filled 2019-10-20: qty 20

## 2019-10-20 MED ORDER — ROCURONIUM BROMIDE 100 MG/10ML IV SOLN
INTRAVENOUS | Status: DC | PRN
  Administered 2019-10-20: 30 mg via INTRAVENOUS

## 2019-10-20 MED ORDER — MORPHINE SULFATE (PF) 4 MG/ML IV SOLN: 2.0000 mg | INTRAVENOUS | Status: DC | PRN

## 2019-10-20 MED ORDER — SUGAMMADEX SODIUM 200 MG/2ML IV SOLN
INTRAVENOUS | Status: DC | PRN
  Administered 2019-10-20: 150 mg via INTRAVENOUS

## 2019-10-20 MED ORDER — ACETAMINOPHEN 10 MG/ML IV SOLN
INTRAVENOUS | Status: AC
  Filled 2019-10-20: qty 100

## 2019-10-20 MED ORDER — CHLORHEXIDINE GLUCONATE 0.12 % MT SOLN
OROMUCOSAL | Status: AC
  Filled 2019-10-20: qty 15

## 2019-10-20 MED ORDER — MUPIROCIN 2 % EX OINT
1.0000 "application " | TOPICAL_OINTMENT | Freq: Two times a day (BID) | CUTANEOUS | Status: DC
  Administered 2019-10-20 – 2019-10-24 (×7): 1 via NASAL
  Filled 2019-10-20: qty 22

## 2019-10-20 MED ORDER — CHLORHEXIDINE GLUCONATE CLOTH 2 % EX PADS
6.0000 | MEDICATED_PAD | Freq: Every day | CUTANEOUS | Status: AC
  Administered 2019-10-20 – 2019-10-24 (×5): 6 via TOPICAL

## 2019-10-20 MED ORDER — KETOROLAC TROMETHAMINE 30 MG/ML IJ SOLN
INTRAMUSCULAR | Status: DC | PRN
  Administered 2019-10-20: 30 mg via INTRAVENOUS

## 2019-10-20 MED ORDER — ALBUTEROL SULFATE HFA 108 (90 BASE) MCG/ACT IN AERS
INHALATION_SPRAY | RESPIRATORY_TRACT | Status: DC | PRN
  Administered 2019-10-20: 6 via RESPIRATORY_TRACT

## 2019-10-20 SURGICAL SUPPLY — 50 items
"PENCIL ELECTRO HAND CTR " (MISCELLANEOUS) ×1 IMPLANT
BLADE MED AGGRESSIVE (BLADE) ×2 IMPLANT
BNDG COHESIVE 4X5 TAN STRL (GAUZE/BANDAGES/DRESSINGS) ×3 IMPLANT
BNDG ELASTIC 4X5.8 VLCR NS LF (GAUZE/BANDAGES/DRESSINGS) ×3 IMPLANT
BNDG ESMARK 4X12 TAN STRL LF (GAUZE/BANDAGES/DRESSINGS) ×3 IMPLANT
BNDG GAUZE 4.5X4.1 6PLY STRL (MISCELLANEOUS) ×7 IMPLANT
CANISTER SUCT 1200ML W/VALVE (MISCELLANEOUS) ×3 IMPLANT
COVER WAND RF STERILE (DRAPES) ×3 IMPLANT
CUFF TOURN SGL QUICK 12 (TOURNIQUET CUFF) IMPLANT
CUFF TOURN SGL QUICK 18X4 (TOURNIQUET CUFF) ×2 IMPLANT
DRAIN PENROSE 1/4X12 LTX STRL (WOUND CARE) ×3 IMPLANT
DRAPE FLUOR MINI C-ARM 54X84 (DRAPES) ×3 IMPLANT
DRSG GAUZE FLUFF 36X18 (GAUZE/BANDAGES/DRESSINGS) ×3 IMPLANT
DURAPREP 26ML APPLICATOR (WOUND CARE) ×3 IMPLANT
ELECT REM PT RETURN 9FT ADLT (ELECTROSURGICAL) ×3
ELECTRODE REM PT RTRN 9FT ADLT (ELECTROSURGICAL) ×1 IMPLANT
GAUZE SPONGE 4X4 12PLY STRL (GAUZE/BANDAGES/DRESSINGS) ×7 IMPLANT
GAUZE XEROFORM 1X8 LF (GAUZE/BANDAGES/DRESSINGS) ×3 IMPLANT
GLOVE BIO SURGEON STRL SZ7.5 (GLOVE) ×3 IMPLANT
GLOVE INDICATOR 8.0 STRL GRN (GLOVE) ×3 IMPLANT
GOWN STRL REUS W/ TWL LRG LVL3 (GOWN DISPOSABLE) ×2 IMPLANT
GOWN STRL REUS W/TWL LRG LVL3 (GOWN DISPOSABLE) ×4
HANDLE YANKAUER SUCT BULB TIP (MISCELLANEOUS) ×3 IMPLANT
HANDPIECE VERSAJET DEBRIDEMENT (MISCELLANEOUS) ×3 IMPLANT
KIT TURNOVER KIT A (KITS) ×3 IMPLANT
LABEL OR SOLS (LABEL) ×3 IMPLANT
NDL SAFETY ECLIPSE 18X1.5 (NEEDLE) ×1 IMPLANT
NEEDLE HYPO 18GX1.5 SHARP (NEEDLE) ×2
NS IRRIG 500ML POUR BTL (IV SOLUTION) ×3 IMPLANT
PACK EXTREMITY (MISCELLANEOUS) ×3 IMPLANT
PAD ABD DERMACEA PRESS 5X9 (GAUZE/BANDAGES/DRESSINGS) ×7 IMPLANT
PENCIL ELECTRO HAND CTR (MISCELLANEOUS) ×3 IMPLANT
SOL .9 NS 3000ML IRR  AL (IV SOLUTION) ×2
SOL .9 NS 3000ML IRR UROMATIC (IV SOLUTION) ×1 IMPLANT
SOL PREP PVP 2OZ (MISCELLANEOUS) ×3
SOLUTION PREP PVP 2OZ (MISCELLANEOUS) ×1 IMPLANT
SPONGE LAP 18X18 RF (DISPOSABLE) ×5 IMPLANT
STAPLER SKIN PROX 35W (STAPLE) ×2 IMPLANT
STOCKINETTE M/LG 89821 (MISCELLANEOUS) ×3 IMPLANT
STOCKINETTE STRL 6IN 960660 (GAUZE/BANDAGES/DRESSINGS) ×2 IMPLANT
STRAP SAFETY 5IN WIDE (MISCELLANEOUS) ×3 IMPLANT
SUT ETHILON 4-0 (SUTURE) ×4
SUT ETHILON 4-0 FS2 18XMFL BLK (SUTURE) ×2
SUT VIC AB 2-0 SH 27 (SUTURE) ×4
SUT VIC AB 2-0 SH 27XBRD (SUTURE) IMPLANT
SUT VIC AB 3-0 SH 27 (SUTURE) ×2
SUT VIC AB 3-0 SH 27X BRD (SUTURE) IMPLANT
SUT VIC AB 4-0 FS2 27 (SUTURE) ×6 IMPLANT
SUTURE ETHLN 4-0 FS2 18XMF BLK (SUTURE) ×2 IMPLANT
SYR 10ML LL (SYRINGE) ×6 IMPLANT

## 2019-10-20 NOTE — Anesthesia Procedure Notes (Addendum)
Procedure Name: Intubation Date/Time: 10/20/2019 10:30 AM Performed by: Tera Mater, MD Pre-anesthesia Checklist: Patient identified, Patient being monitored, Timeout performed, Emergency Drugs available and Suction available Patient Re-evaluated:Patient Re-evaluated prior to induction Oxygen Delivery Method: Circle system utilized Preoxygenation: Pre-oxygenation with 100% oxygen Induction Type: IV induction Laryngoscope Size: 4 and McGraph Grade View: Grade I Tube type: Oral Tube size: 7.0 mm Number of attempts: 1 Airway Equipment and Method: Stylet Placement Confirmation: ETT inserted through vocal cords under direct vision,  positive ETCO2 and breath sounds checked- equal and bilateral Secured at: 21 cm Tube secured with: Tape Dental Injury: Teeth and Oropharynx as per pre-operative assessment

## 2019-10-20 NOTE — Care Management Important Message (Signed)
Important Message  Patient Details  Name: Tyler [name] MRN: [id number] Date of Birth: 06-14-1955   Medicare Important Message Given:  Yes     Dannette Barbara 10/20/2019, 1:39 PM

## 2019-10-20 NOTE — Progress Notes (Signed)
PROGRESS NOTE    Tyler [name]  [id number] DOB: 05/12/1956 DOA: 10/17/2019 PCP: [name], MD   Brief Narrative:  Tyler [name]  is a 64 y.o. Caucasian male with a known history of CHF, COPD, coronary disease, atrial fibrillation, hypertension and peripheral vascular disease, status post AICD, presented to emergency room with acute onset of worsening left foot swelling with erythema and discharge.  Patient had his left big toe amputated on 3/15 by Dr. Vickki Muff and later on had his second and third toes amputated on 5/14.  His erythema has been extending to his left flank.  No fever or chills, nausea or vomiting or abdominal pain.  No chest pain or palpitations.  No dysuria, oliguria or hematuria or flank pain. Started on broad-spectrum antibiotics and podiatry was consulted.  They are planning to do transmetatarsal amputation.  Subjective: Patient has no new complaints today.  For TMA today.  Assessment & Plan:   Active Problems:   Osteomyelitis of left leg (HCC)  Left lower extremity severe nonpurulent cellulitis with associated left foot osteomyelitis and gangrene with purulent cellulitis. Podiatry is following-going for TMA on Friday. -Continue vancomycin and Zosyn. -Would like to go back home with [facility] home health services if possible after surgery.  Hypertension. -We will continue Entresto .   Anxiety and depression. -We will continue Celexa and Xanax.    Peripheral neuropathy. -We will continue Neurontin.    BPH. -We will continue Flomax.   COPD without current exacerbation. -We will continue his Advair Diskus and Atrovent.   Objective: Vitals:   10/20/19 1248 10/20/19 1333 10/20/19 1438 10/20/19 1539  BP: (!) 96/56 (!) 108/58 (!) 101/53 (!) 88/54  Pulse: 74 77 77 73  Resp: 18 18 18 18   Temp: 98.4 F (36.9 C) 97.7 F (36.5 C) 97.6 F (36.4 C) 97.8 F (36.6 C)  TempSrc: Oral Oral Oral   SpO2: 98% 99% 98% 97%  Weight:      Height:         Intake/Output Summary (Last 24 hours) at 10/20/2019 1556 Last data filed at 10/20/2019 1230 Gross per 24 hour  Intake 75 ml  Output 600 ml  Net -525 ml   Filed Weights   10/17/19 1715 10/20/19 0903  Weight: 74.8 kg 74.8 kg    Examination:  General exam: Appears calm and comfortable  Respiratory system: Clear to auscultation. Respiratory effort normal. Cardiovascular system: S1 & S2 heard, RRR. No JVD, murmurs, rubs, gallops or clicks. Gastrointestinal system: Soft, nontender, nondistended, bowel sounds positive. Central nervous system: Alert and oriented. No focal neurological deficits.Symmetric 5 x 5 power. Extremities: Right AKA, left foot with clean bandage. Skin: No rashes, lesions or ulcers Psychiatry: Judgement and insight appear normal. Mood & affect appropriate.       DVT prophylaxis: SCDs Code Status: Full Family Communication: Sister was updated at bedside. Disposition Plan:  Status is: Inpatient   Remains inpatient appropriate because:Inpatient level of care appropriate due to severity of illness   Dispo: The patient is from: Home              Anticipated d/c is to: Home              Anticipated d/c date is: 2-3 days.              Patient currently is not medically stable to d/c.  Patient wants to go back home.  TOC is looking for his options after TMA.   Consultants:   Podiatry  Procedures:  Antimicrobials: Vancomycin Zosyn  Data Reviewed: I have personally reviewed following labs and imaging studies  CBC: Recent Labs  Lab 10/17/19 1720 10/18/19 0632  WBC 10.2 10.3  NEUTROABS 7.6  --   HGB 10.0* 9.5*  HCT 30.4* 28.8*  MCV 87.6 89.2  PLT 362 [id number]   Basic Metabolic Panel: Recent Labs  Lab 10/17/19 1720 10/18/19 0632 10/19/19 0904 10/20/19 0418  NA 126* 129*  --  127*  K 4.7 4.1  --  4.3  CL 88* 91*  --  94*  CO2 29 29  --  27  GLUCOSE 102* 87  --  81  BUN 6* 6*  --  9  CREATININE 0.71 0.78 0.74 0.75  CALCIUM 8.9 8.7*  --  8.3*    GFR: Estimated Creatinine Clearance: 98.7 mL/min (by C-G formula based on SCr of 0.75 mg/dL). Liver Function Tests: Recent Labs  Lab 10/17/19 1720  AST 23  ALT 14  ALKPHOS 69  BILITOT 0.5  PROT 6.7  ALBUMIN 3.3*   No results for input(s): LIPASE, AMYLASE in the last 168 hours. No results for input(s): AMMONIA in the last 168 hours. Coagulation Profile: Recent Labs  Lab 10/17/19 1720  INR 2.3*   Cardiac Enzymes: No results for input(s): CKTOTAL, CKMB, CKMBINDEX, TROPONINI in the last 168 hours. BNP (last 3 results) No results for input(s): PROBNP in the last 8760 hours. HbA1C: No results for input(s): HGBA1C in the last 72 hours. CBG: No results for input(s): GLUCAP in the last 168 hours. Lipid Profile: No results for input(s): CHOL, HDL, LDLCALC, TRIG, CHOLHDL, LDLDIRECT in the last 72 hours. Thyroid Function Tests: No results for input(s): TSH, T4TOTAL, FREET4, T3FREE, THYROIDAB in the last 72 hours. Anemia Panel: No results for input(s): VITAMINB12, FOLATE, FERRITIN, TIBC, IRON, RETICCTPCT in the last 72 hours. Sepsis Labs: Recent Labs  Lab 10/17/19 1720 10/17/19 1920  LATICACIDVEN 0.9 1.2    Recent Results (from the past 240 hour(s))  Culture, blood (Routine x 2)     Status: None (Preliminary result)   Collection Time: 10/17/19  5:20 PM   Specimen: BLOOD  Result Value Ref Range Status   Specimen Description BLOOD RIGHT ANTECUBITAL  Final   Special Requests   Final    BOTTLES DRAWN AEROBIC AND ANAEROBIC Blood Culture adequate volume   Culture   Final    NO GROWTH 3 DAYS Performed at [facility], [address] [place], [place] [address]    Report Status PENDING  Incomplete  Culture, blood (Routine x 2)     Status: None (Preliminary result)   Collection Time: 10/17/19  5:25 PM   Specimen: BLOOD LEFT HAND  Result Value Ref Range Status   Specimen Description BLOOD LEFT HAND  Final   Special Requests   Final    BOTTLES DRAWN AEROBIC AND  ANAEROBIC Blood Culture adequate volume   Culture   Final    NO GROWTH 2 DAYS Performed at [facility], [address], [place], [place] [address]    Report Status PENDING  Incomplete  Aerobic/Anaerobic Culture (surgical/deep wound)     Status: None (Preliminary result)   Collection Time: 10/18/19  1:05 PM   Specimen: Foot; Abscess  Result Value Ref Range Status   Specimen Description   Final    FOOT LEFT FOOT Performed at [facility], [address] [place], [place] [address]    Special Requests   Final    NONE Performed at [facility], [address], [place], [place] [address]    Gram Stain   Final    ABUNDANT WBC PRESENT, PREDOMINANTLY PMN ABUNDANT GRAM POSITIVE COCCI ABUNDANT GRAM NEGATIVE RODS    Culture   Final    ABUNDANT PSEUDOMONAS AERUGINOSA FEW STAPHYLOCOCCUS AUREUS FEW GROUP B STREP(S.AGALACTIAE)ISOLATED TESTING AGAINST S. AGALACTIAE NOT ROUTINELY PERFORMED DUE TO PREDICTABILITY OF AMP/PEN/VAN SUSCEPTIBILITY. HOLDING FOR POSSIBLE ANAEROBE Performed at [facility], [place] [address] [place], [place] [address]    Report Status PENDING  Incomplete   Organism ID, Bacteria PSEUDOMONAS AERUGINOSA  Final      Susceptibility   Pseudomonas aeruginosa - MIC*    CEFTAZIDIME 2 SENSITIVE Sensitive     CIPROFLOXACIN <=0.25 SENSITIVE Sensitive     GENTAMICIN <=1 SENSITIVE Sensitive     IMIPENEM 2 SENSITIVE Sensitive     PIP/TAZO 8 SENSITIVE Sensitive     CEFEPIME <=1 SENSITIVE Sensitive     * ABUNDANT PSEUDOMONAS AERUGINOSA  SARS Coronavirus 2 by RT PCR (hospital order, performed in [facility] hospital lab) Nasopharyngeal Nasopharyngeal Swab     Status: None   Collection Time: 10/18/19  3:54 PM   Specimen: Nasopharyngeal Swab  Result Value Ref Range Status   SARS Coronavirus 2 NEGATIVE NEGATIVE Final    Comment: (NOTE) SARS-CoV-2 target nucleic acids are NOT DETECTED. The SARS-CoV-2 RNA is generally detectable in upper and  lower respiratory specimens during the acute phase of infection. The lowest concentration of SARS-CoV-2 viral copies this assay can detect is 250 copies / mL. A negative result does not preclude SARS-CoV-2 infection and should not be used as the sole basis for treatment or other patient management decisions.  A negative result may occur with improper specimen collection / handling, submission of specimen other than nasopharyngeal swab, presence of viral mutation(s) within the areas targeted by this assay, and inadequate number of viral copies (<250 copies / mL). A negative result must be combined with clinical observations, patient history, and epidemiological information. Fact Sheet for Patients:   StrictlyIdeas.no Fact Sheet for Healthcare Providers: BankingDealers.co.za This test is not yet approved or cleared  by the [place] FDA and has been authorized for detection and/or diagnosis of SARS-CoV-2 by FDA under an Emergency Use Authorization (EUA).  This EUA will remain in effect (meaning this test can be used) for the duration of the COVID-19 declaration under Section 564(b)(1) of the Act, 21 U.S.C. section 360bbb-3(b)(1), unless the authorization is terminated or revoked sooner. Performed at [facility], [address] [place], [place] [address]   Surgical pcr screen     Status: Abnormal   Collection Time: 10/20/19  2:18 AM   Specimen: Nasal Mucosa; Nasal Swab  Result Value Ref Range Status   MRSA, PCR NEGATIVE NEGATIVE Final   Staphylococcus aureus POSITIVE (A) NEGATIVE Final    Comment: (NOTE) The Xpert SA Assay (FDA approved for NASAL specimens in patients 74 years of age and older), is one component of a comprehensive surveillance program. It is not intended to diagnose infection nor to guide or monitor treatment. Performed at [facility], [address] [place], [place] [address]       Radiology Studies: No results found.  Scheduled Meds: . aspirin EC  81 mg Oral QPC supper  . chlorhexidine  15 mL Mouth/Throat Once  . chlorhexidine      . Chlorhexidine Gluconate Cloth  6 each Topical Daily  . citalopram  10 mg Oral Daily  . docusate sodium  100 mg Oral Daily  . fluticasone  2 spray Each Nare Daily  . furosemide  20 mg Oral Daily  . gabapentin  300 mg Oral BID  . guaiFENesin  600 mg Oral QPM  . ipratropium  2 spray Each Nare BID  . Ipratropium-Albuterol  1 puff Inhalation Q4H  . loratadine  10 mg Oral QHS  . metoprolol succinate  25 mg Oral Daily  . mometasone-formoterol  2 puff Inhalation BID  . mupirocin ointment  1 application Nasal BID  . pantoprazole  40 mg Oral QAC breakfast  . pravastatin  20 mg Oral q1800  . Restore Wound Care Dressing  1 each Topical Daily  . sacubitril-valsartan  1 tablet Oral BID  . tamsulosin  0.4 mg Oral QPC supper  . tiotropium  18 mcg Inhalation Daily  . vitamin B-12  1,000 mcg Oral Daily   Continuous Infusions: . sodium chloride 300 mL/hr at 10/20/19 1058  . piperacillin-tazobactam (ZOSYN)  IV 3.375 g (10/20/19 0216)  . vancomycin 750 mg (10/20/19 0215)     LOS: 2 days   Time spent: 35 minutes  Lorella Nimrod, MD [facility]  If 7PM-7AM, please contact night-coverage [email or web address]  10/20/2019, 3:56 PM   This record has been created using Systems analyst. Errors have been sought and corrected,but may not always be located. Such creation errors do not reflect on the standard of care.

## 2019-10-20 NOTE — Op Note (Signed)
Date of operation: 10/20/2019.  Surgeon: Durward Fortes D.P.M.  Preoperative diagnosis: Gangrene with osteomyelitis left forefoot.  Postoperative diagnosis: Same.  Procedure: Transmetatarsal amputation left forefoot.  Anesthesia: General endotracheal.  Hemostasis: Pneumatic tourniquet left ankle 300 mmHg.  Estimated blood loss: 100 cc.  Injectables: 30 cc 0.5% Marcaine plain.  Culture bone left third metatarsal.  Pathology: Left forefoot.  Complications: None apparent.  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  Postoperatively he has developed infection with gangrenous changes and osteomyelitis to the left forefoot and decision was made for transmetatarsal amputation on the left foot.  Operative procedure: Patient was taken to the operating room and placed on the table in the supine position.  Following satisfactory endotracheal general anesthesia a pneumatic tourniquet was applied at the level of the left ankle and the foot was prepped and draped in the usual sterile fashion.  The foot was exsanguinated elevation only and the tourniquet inflated to 300 mmHg.  Attention was then directed to the distal aspect of the left foot where an incision was made medial to lateral across the dorsum of the foot full-thickness down to the level of bone.  Bleeders were identified and cauterized as necessary.  Dissection was carried proximally at the level of the bones using a key elevator.  A plantar incision was then made from medial to lateral crossing the forefoot full-thickness and dissection carried back with a periosteal elevator to the level of resection of the metatarsals.  Using a sagittal saw metatarsals 1 through 5 were then transected and dissected away from the proximal foot and the entire forefoot was removed from  the table in toto.  Sample of bone from the third metatarsal was taken for culture.  The remaining tissues appeared granular and healthy with good bleeding.  The wound was then thoroughly debrided and irrigated using a versa jet on a setting of 3.  The wound was closed with 2-0 Vicryl simple interrupted sutures for deep and superficial subcutaneous followed by superficial subcutaneous closure using 4-0 Vicryl simple interrupted sutures.  Incision was then closed with skin staples.  30 cc of 0.5% Marcaine plain were then injected around the forefoot for postoperative analgesia.  Xeroform 4 x 4's ABDs and Kerlix applied to the left lower extremity.  The tourniquet was released.  A second Kerlix and Ace wrap then applied to the left lower extremity.  Patient was awakened and transported to the PACU with vital signs stable and in good condition.

## 2019-10-20 NOTE — Interval H&P Note (Signed)
History and Physical Interval Note:  10/20/2019 9:04 AM  Tyler [name]  has presented today for surgery, with the diagnosis of Osteomyelitis gangrene.  The various methods of treatment have been discussed with the patient and family. After consideration of risks, benefits and other options for treatment, the patient has consented to  Procedure(s): TRANSMETATARSAL AMPUTATION (Left) as a surgical intervention.  The patient's history has been reviewed, patient examined, no change in status, stable for surgery.  I have reviewed the patient's chart and labs.  Questions were answered to the patient's satisfaction.     Durward Fortes

## 2019-10-20 NOTE — Progress Notes (Signed)
Pharmacy Antibiotic Note  Tyler [name] is a 64 y.o. male admitted on 10/17/2019 with cellulitis and osteomyelitis of the left foot.  Pharmacy has been consulted for Vancomycin and Zosyn dosing.  Patient underwent left big toe amputation on 3/15, and had second and third toes amputated on 5/14.  Wound cultures from 5/14 show MRSA and Pseudomonas.  Transmetatarsal amputation occurred this visit on 5/28.    Plan: Day 4 of antibiotics  Zosyn Continue Zosyn 3.375 g IV q 8 hours  Vancomycin Will continue maintenance dose to 750 mg IV q 8 hrs based on [facility] Nom[facility].  Will dose to goal trough of 15-20.  Anticipated Css min 15.3.  Pharmacy will continue to monitor renal function, and will obtain vancomycin levels as clinically indicated.   Height: 6\' 1"  (185.4 cm) Weight: 74.8 kg (165 lb) IBW/kg (Calculated) : 79.9  Temp (24hrs), Avg:97.7 F (36.5 C), Min:97.5 F (36.4 C), Max:98 F (36.7 C)  Recent Labs  Lab 10/17/19 1720 10/17/19 1920 10/18/19 0632 10/19/19 0904 10/20/19 0418  WBC 10.2  --  10.3  --   --   CREATININE 0.71  --  0.78 0.74 0.75  LATICACIDVEN 0.9 1.2  --   --   --     Estimated Creatinine Clearance: 98.7 mL/min (by C-G formula based on SCr of 0.75 mg/dL).    Allergies  Allergen Reactions  . Apixaban Rash  . Rivaroxaban Rash    Antimicrobials this admission:  5/25 Ceftriaxone x 1 5/26 Zosyn >>  5/25 Vancomycin >>  Dose adjustments this admission: Vancomycin 1250 mg q 12 hr >> 750 mg q 8 hr  Microbiology results: 5/26 WCx (foot abscess) Abundant Pseudomonas (pending) 5/28 MRSA PCR positive 5/26 COVID (nasopharyngeal swab) negative 5/25 BCx NG x 3   Thank you for allowing pharmacy to be a part of this patient's care.  Gerald Dexter, PharmD 10/20/2019 8:34 AM

## 2019-10-20 NOTE — Transfer of Care (Signed)
Immediate Anesthesia Transfer of Care Note  Patient: Tyler [name]  Procedure(s) Performed: TRANSMETATARSAL AMPUTATION (Left Toe)  Patient Location: PACU  Anesthesia Type:General  Level of Consciousness: sedated  Airway & Oxygen Therapy: Patient Spontanous Breathing and Patient connected to face mask oxygen  Post-op Assessment: Report given to RN and Post -op Vital signs reviewed and stable  Post vital signs: Reviewed and stable  Last Vitals:  Vitals Value Taken Time  BP 99/82 10/20/19 1155  Temp    Pulse 74 10/20/19 1159  Resp 22 10/20/19 1159  SpO2 99 % 10/20/19 1159  Vitals shown include unvalidated device data.  Last Pain:  Vitals:   10/20/19 0903  TempSrc: Tympanic  PainSc: 9          Complications: No apparent anesthesia complications

## 2019-10-20 NOTE — TOC Initial Note (Signed)
Transition of Care [facility]) - Initial/Assessment Note    Patient Details  Name: Tyler [name] MRN: [id number] Date of Birth: [birth year]  Transition of [facility]) CM/SW Contact:    Su Hilt, RN Phone Number: 10/20/2019, 4:36 PM  Clinical Narrative:                 Met with the patient in the room to discuss DC needs and plan, he wants to go home with his wife, he has a room set up at home for his care, he has a hospital bed, A wheelchair, a transport chair, a bedside commode, he needs a [employer] fast streamer, I notified Zack with [employer]  He needs PT, OT, RN, Aide for Home health services and I spoke with Helene Kelp with [facility] to set up. He has not been seen by PT yet.  I explained it would most likely be advisable for him to go to SNF for a short time before going home, He stated that he prefers to go home and that he has been in a wheelchair for 2 years and is pretty familiar with what to do.  He was asking how long he will be non weight bearing as he was using his heel to maneuver before.  I encouraged him to speak to the Doctor and ask these questions as it will be very important to follow Dr advise.  He agrees.    Expected Discharge Plan: [place] Barriers to Discharge: Continued Medical Work up   Patient Goals and CMS Choice Patient states their goals for this hospitalization and ongoing recovery are:: wants to go home with his wife      Expected Discharge Plan and Services Expected Discharge Plan: [place]   Discharge Planning Services: CM Consult   Living arrangements for the past 2 months: Single Family Home                 DME Arranged: Other see comment([facility] [employer]ift) DME Agency: [employer] Date DME Agency Contacted: 10/20/19 Time DME Agency Contacted: [phone] Representative spoke with at DME Agency: zack HH Arranged: PT, RN, OT, Nurse's Aide [place] Agency: [facility] (now [facility]) Date [place]: 10/20/19 Time [place]: 1635 Representative spoke with at [place] Arrangements/Services Living arrangements for the past 2 months: [place] with:: Spouse Patient language and need for interpreter reviewed:: Yes Do you feel safe going back to the place where you live?: Yes      Need for Family Participation in Patient Care: No (Comment) Care giver support system in place?: Yes (comment) Current home services: DME, Other (comment)(wheelchair, transport chair, hospital bed, bedisde commode, Palliative) Criminal Activity/Legal Involvement Pertinent to Current Situation/Hospitalization: No - Comment as needed  Activities of Daily Living Home Assistive Devices/Equipment: None ADL Screening (condition at time of admission) Patient's cognitive ability adequate to safely complete daily activities?: Yes Is the patient deaf or have difficulty hearing?: No Does the patient have difficulty seeing, even when wearing glasses/contacts?: No Does the patient have difficulty concentrating, remembering, or making decisions?: No Patient able to express need for assistance with ADLs?: Yes Does the patient have difficulty dressing or bathing?: No Independently performs ADLs?: Yes (appropriate for developmental age) Communication: Independent Dressing (OT): Independent Is this a change from baseline?: Pre-admission baseline Grooming: Independent Is this a change from baseline?: Pre-admission baseline Feeding: Independent Bathing: Independent Is this a change from baseline?: Pre-admission  baseline Toileting: Independent Is this a change from baseline?: Pre-admission baseline In/Out Bed: Independent Is this a change from baseline?: Pre-admission baseline Walks in Home: Dependent Is this a change from baseline?: Pre-admission baseline Does the patient have difficulty walking or climbing stairs?: Yes Weakness of Legs: Both Weakness of Arms/Hands: None  Permission  Sought/Granted   Permission granted to share information with : Yes, Verbal Permission Granted              Emotional Assessment Appearance:: Appears stated age Attitude/Demeanor/Rapport: Engaged Affect (typically observed): Appropriate Orientation: : Oriented to Self, Oriented to Place, Oriented to  Time, Oriented to Situation Alcohol / Substance Use: Not Applicable Psych Involvement: No (comment)  Admission diagnosis:  Osteomyelitis of left foot (HCC) [M86.9] Osteomyelitis of left leg (HCC) [M86.9] Patient Active Problem List   Diagnosis Date Noted  . Osteomyelitis of left leg ([place]) 10/18/2019  . Leg swelling 03/26/2019  . Palliative care by specialist 02/16/2019  . Atherosclerosis of native arteries of the extremities with ulceration ([place]) 01/31/2019  . Postoperative seroma of subcutaneous tissue after non-dermatologic procedure 02/04/2018  . Current mild episode of major depressive disorder without prior episode ([place]) 12/14/2017  . Atherosclerosis of artery of extremity with rest pain ([place]) 08/11/2017  . COPD (chronic obstructive pulmonary disease) ([place]) 06/22/2017  . Cervical disc disease with myelopathy 05/28/2017  . COPD with acute exacerbation ([place]) 10/13/2016  . Acute respiratory failure with hypoxia and hypercapnia ([place]) 10/13/2016  . Chronic systolic CHF (congestive heart failure) ([place]) 10/13/2016  . AF (paroxysmal atrial fibrillation) ([place]) 10/13/2016  . Hx of AKA (above knee amputation), right ([place]) 05/15/2016  . Essential hypertension, benign 05/15/2016  . PVD (peripheral vascular disease) ([place]) 05/15/2016  . Atherosclerotic peripheral vascular disease with intermittent claudication ([place]) 08/01/2014  . Moderate tricuspid insufficiency 08/01/2014  . Mixed hyperlipidemia 07/23/2014  . AICD (automatic cardioverter/defibrillator) present 06/12/2011  . Factor V Leiden mutation ([place]) 06/12/2011  . Coronary artery disease 06/12/2011   PCP:  [name],  MD Pharmacy:   [id number] [place] [address] [id number]: [phone]: [phone]    Social Determinants of Health (SDOH) Interventions    Readmission Risk Interventions No flowsheet data found.

## 2019-10-21 ENCOUNTER — Inpatient Hospital Stay: Payer: Medicare HMO

## 2019-10-21 LAB — CBC
HCT: 24.4 % — ABNORMAL LOW (ref 39.0–52.0)
Hemoglobin: 7.9 g/dL — ABNORMAL LOW (ref 13.0–17.0)
MCH: 29.3 pg (ref 26.0–34.0)
MCHC: 32.4 g/dL (ref 30.0–36.0)
MCV: 90.4 fL (ref 80.0–100.0)
Platelets: 351 10*3/uL (ref 150–400)
RBC: 2.7 MIL/uL — ABNORMAL LOW (ref 4.22–5.81)
RDW: 13.3 % (ref 11.5–15.5)
WBC: 10.4 10*3/uL (ref 4.0–10.5)
nRBC: 0 % (ref 0.0–0.2)

## 2019-10-21 LAB — AEROBIC/ANAEROBIC CULTURE W GRAM STAIN (SURGICAL/DEEP WOUND)

## 2019-10-21 LAB — BASIC METABOLIC PANEL
Anion gap: 6 (ref 5–15)
BUN: 9 mg/dL (ref 8–23)
CO2: 27 mmol/L (ref 22–32)
Calcium: 8.2 mg/dL — ABNORMAL LOW (ref 8.9–10.3)
Chloride: 98 mmol/L (ref 98–111)
Creatinine, Ser: 0.72 mg/dL (ref 0.61–1.24)
GFR calc Af Amer: >60 mL/min (ref >60)
GFR calc non Af Amer: >60 mL/min (ref >60)
Glucose, Bld: 98 mg/dL (ref 70–99)
Potassium: 4.4 mmol/L (ref 3.5–5.1)
Sodium: 131 mmol/L — ABNORMAL LOW (ref 135–145)

## 2019-10-21 MED ORDER — HYDROMORPHONE HCL 1 MG/ML IJ SOLN
0.5000 mg | INTRAMUSCULAR | Status: DC | PRN
  Administered 2019-10-21 – 2019-10-24 (×12): 0.5 mg via INTRAVENOUS
  Filled 2019-10-21 (×12): qty 1

## 2019-10-21 MED ORDER — SODIUM CHLORIDE 0.9 % IV BOLUS
500.0000 mL | Freq: Once | INTRAVENOUS | Status: AC
  Administered 2019-10-21: 500 mL via INTRAVENOUS

## 2019-10-21 NOTE — Progress Notes (Deleted)
   10/21/19 1457  Assess: MEWS Score  BP (!) 78/61  Pulse Rate 73  SpO2 100 %  O2 Device Nasal Cannula  O2 Flow Rate (L/min) 3 L/min  Assess: MEWS Score  MEWS Temp 0  MEWS Systolic 2  MEWS Pulse 0  MEWS RR 0  MEWS LOC 0  MEWS Score 2  MEWS Score Color Yellow  Assess: if the MEWS score is Yellow or Red  Were vital signs taken at a resting state? Yes  Focused Assessment Documented focused assessment  Early Detection of Sepsis Score *See Row Information* Low  MEWS guidelines implemented *See Row Information* Yes  Treat  MEWS Interventions Administered scheduled meds/treatments  Take Vital Signs  Increase Vital Sign Frequency  Yellow: Q 2hr X 2 then Q 4hr X 2, if remains yellow, continue Q 4hrs  Escalate  MEWS: Escalate Yellow: discuss with charge nurse/RN and consider discussing with provider and RRT  Notify: Charge Nurse/RN  Name of Charge Nurse/RN Notified Estill Bamberg RN  Date Charge Nurse/RN Notified 10/21/19  Time Charge Nurse/RN Notified 96  Notify: Provider  Provider Name/Title Dr Reesa Chew  Date Provider Notified 10/21/19  Time Provider Notified 1457  Notification Type Call  Notification Reason Other (Comment) (low BP)  Response See new orders (53ml bolus ordered)  Date of Provider Response 10/21/19  Time of Provider Response 1500  Document  Patient Outcome Stabilized after interventions  Progress note created (see row info) Yes

## 2019-10-21 NOTE — Progress Notes (Addendum)
PROGRESS NOTE    Tyler [name]  [id number] DOB: [birth year] DOA: 10/17/2019 PCP: [name], MD   Brief Narrative:  Tyler [name]  is a 64 y.o. Caucasian male with a known history of CHF, COPD, coronary disease, atrial fibrillation, hypertension and peripheral vascular disease, status post AICD, presented to emergency room with acute onset of worsening left foot swelling with erythema and discharge.  Patient had his left big toe amputated on 3/15 by Dr. Vickki Muff and later on had his second and third toes amputated on 5/14.  His erythema has been extending to his left flank.  No fever or chills, nausea or vomiting or abdominal pain.  No chest pain or palpitations.  No dysuria, oliguria or hematuria or flank pain. Started on broad-spectrum antibiotics and podiatry was consulted.  They are planning to do transmetatarsal amputation.  Subjective: Patient was feeling little short of breath.  Although saturating well.  He had his transmetatarsal amputation yesterday.  He was also afraid that he might need a revision which will be BKA as surgeon told him today that lower part of his foot does not look much good.  Assessment & Plan:   Active Problems:   Osteomyelitis of left leg (HCC)  Left lower extremity severe nonpurulent cellulitis with associated left foot osteomyelitis and gangrene with purulent cellulitis. Podiatry is following-had his transmetatarsal amputation yesterday. -Continue vancomycin and Zosyn. -Podiatry will reevaluate him on Monday, according to their note there was some concern about the bottom of his foot which appears little cyanosed, picture in Dr.Cline's note. -Would like to go back home with [facility] if possible after surgery.  Shortness of breath.  Was saturating well on his home oxygen.  Chest was clear.  He was given a breathing treatment.  X-ray was obtained which was within normal limits. -Discontinue IV fluid. -Monitor.  Postsurgical  anemia.  Hemoglobin dropped to 7.9.  There is a drop in all cell lines and patient was on IV fluid, some dilutional effect.  He did had his surgery yesterday. - Monitor CBC. -Check iron studies.  Hypertension. -We will continue Entresto .   Anxiety and depression. -We will continue Celexa and Xanax.    Peripheral neuropathy. -We will continue Neurontin.    BPH. -We will continue Flomax.   COPD without current exacerbation. -We will continue his Advair Diskus and Atrovent.   Objective: Vitals:   10/21/19 1101 10/21/19 1107 10/21/19 1138 10/21/19 1144  BP: 97/61 (!) 106/58 (!) 114/51   Pulse: 79 78 78   Resp:   16   Temp:   98 F (36.7 C)   TempSrc:   Oral   SpO2:   (!) 88% 100%  Weight:      Height:        Intake/Output Summary (Last 24 hours) at 10/21/2019 1325 Last data filed at 10/21/2019 1006 Gross per 24 hour  Intake 3928.25 ml  Output 1525 ml  Net 2403.25 ml   Filed Weights   10/17/19 1715 10/20/19 0903  Weight: 74.8 kg 74.8 kg    Examination:  General exam: Appears calm and comfortable  Respiratory system: Clear to auscultation. Respiratory effort normal. Cardiovascular system: S1 & S2 heard, RRR. No JVD, murmurs, rubs, gallops or clicks. Gastrointestinal system: Soft, nontender, nondistended, bowel sounds positive. Central nervous system: Alert and oriented. No focal neurological deficits.Symmetric 5 x 5 power. Extremities: Right AKA, left foot with clean bulky bandage and Ace wrap. Skin: No rashes, lesions or ulcers Psychiatry: Judgement  and insight appear normal. Mood & affect appropriate.    DVT prophylaxis: SCDs Code Status: Full Family Communication: Family at bedside. Disposition Plan:  Status is: Inpatient   Remains inpatient appropriate because:Inpatient level of care appropriate due to severity of illness   Dispo: The patient is from: Home              Anticipated d/c is to: Home              Anticipated d/c date is: 2-3 days.               Patient currently is not medically stable to d/c.  Patient wants to go back home.  TOC is looking for his options after TMA.  Might need a revision of his amputation.  Podiatry is going to reevaluate him on Monday.   Consultants:   Podiatry  Procedures:  Antimicrobials: Vancomycin Zosyn  Data Reviewed: I have personally reviewed following labs and imaging studies  CBC: Recent Labs  Lab 10/17/19 1720 10/18/19 0632 10/21/19 0443  WBC 10.2 10.3 10.4  NEUTROABS 7.6  --   --   HGB 10.0* 9.5* 7.9*  HCT 30.4* 28.8* 24.4*  MCV 87.6 89.2 90.4  PLT 362 375 [id number]   Basic Metabolic Panel: Recent Labs  Lab 10/17/19 1720 10/18/19 0632 10/19/19 0904 10/20/19 0418 10/21/19 0443  NA 126* 129*  --  127* 131*  K 4.7 4.1  --  4.3 4.4  CL 88* 91*  --  94* 98  CO2 29 29  --  27 27  GLUCOSE 102* 87  --  81 98  BUN 6* 6*  --  9 9  CREATININE 0.71 0.78 0.74 0.75 0.72  CALCIUM 8.9 8.7*  --  8.3* 8.2*   GFR: Estimated Creatinine Clearance: 98.7 mL/min (by C-G formula based on SCr of 0.72 mg/dL). Liver Function Tests: Recent Labs  Lab 10/17/19 1720  AST 23  ALT 14  ALKPHOS 69  BILITOT 0.5  PROT 6.7  ALBUMIN 3.3*   No results for input(s): LIPASE, AMYLASE in the last 168 hours. No results for input(s): AMMONIA in the last 168 hours. Coagulation Profile: Recent Labs  Lab 10/17/19 1720  INR 2.3*   Cardiac Enzymes: No results for input(s): CKTOTAL, CKMB, CKMBINDEX, TROPONINI in the last 168 hours. BNP (last 3 results) No results for input(s): PROBNP in the last 8760 hours. HbA1C: No results for input(s): HGBA1C in the last 72 hours. CBG: No results for input(s): GLUCAP in the last 168 hours. Lipid Profile: No results for input(s): CHOL, HDL, LDLCALC, TRIG, CHOLHDL, LDLDIRECT in the last 72 hours. Thyroid Function Tests: No results for input(s): TSH, T4TOTAL, FREET4, T3FREE, THYROIDAB in the last 72 hours. Anemia Panel: No results for input(s): VITAMINB12, FOLATE,  FERRITIN, TIBC, IRON, RETICCTPCT in the last 72 hours. Sepsis Labs: Recent Labs  Lab 10/17/19 1720 10/17/19 1920  LATICACIDVEN 0.9 1.2    Recent Results (from the past 240 hour(s))  Culture, blood (Routine x 2)     Status: None (Preliminary result)   Collection Time: 10/17/19  5:20 PM   Specimen: BLOOD  Result Value Ref Range Status   Specimen Description BLOOD RIGHT ANTECUBITAL  Final   Special Requests   Final    BOTTLES DRAWN AEROBIC AND ANAEROBIC Blood Culture adequate volume   Culture   Final    NO GROWTH 4 DAYS Performed at [facility], [address], [place],  [address]    Report Status PENDING  Incomplete  Culture, blood (Routine x 2)     Status: None (Preliminary result)   Collection Time: 10/17/19  5:25 PM   Specimen: BLOOD LEFT HAND  Result Value Ref Range Status   Specimen Description BLOOD LEFT HAND  Final   Special Requests   Final    BOTTLES DRAWN AEROBIC AND ANAEROBIC Blood Culture adequate volume   Culture   Final    NO GROWTH 3 DAYS Performed at [facility], [address] [place], [place] [address]    Report Status PENDING  Incomplete  Aerobic/Anaerobic Culture (surgical/deep wound)     Status: None   Collection Time: 10/18/19  1:05 PM   Specimen: Foot; Abscess  Result Value Ref Range Status   Specimen Description   Final    FOOT LEFT FOOT Performed at [facility], [address] [place], [place] [address]    Special Requests   Final    NONE Performed at [facility], [address] [place], [place] [address]    Gram Stain   Final    ABUNDANT WBC PRESENT, PREDOMINANTLY PMN ABUNDANT GRAM POSITIVE COCCI ABUNDANT GRAM NEGATIVE RODS    Culture   Final    ABUNDANT PSEUDOMONAS AERUGINOSA FEW STAPHYLOCOCCUS AUREUS FEW GROUP B STREP(S.AGALACTIAE)ISOLATED TESTING AGAINST S. AGALACTIAE NOT ROUTINELY PERFORMED DUE TO PREDICTABILITY OF AMP/PEN/VAN SUSCEPTIBILITY. NO ANAEROBES ISOLATED Performed at  [facility], [place] [address], [place], [place] [address]    Report Status 10/21/2019 FINAL  Final   Organism ID, Bacteria PSEUDOMONAS AERUGINOSA  Final   Organism ID, Bacteria STAPHYLOCOCCUS AUREUS  Final      Susceptibility   Pseudomonas aeruginosa - MIC*    CEFTAZIDIME 2 SENSITIVE Sensitive     CIPROFLOXACIN <=0.25 SENSITIVE Sensitive     GENTAMICIN <=1 SENSITIVE Sensitive     IMIPENEM 2 SENSITIVE Sensitive     PIP/TAZO 8 SENSITIVE Sensitive     CEFEPIME <=1 SENSITIVE Sensitive     * ABUNDANT PSEUDOMONAS AERUGINOSA   Staphylococcus aureus - MIC*    CIPROFLOXACIN <=0.5 SENSITIVE Sensitive     ERYTHROMYCIN >=8 RESISTANT Resistant     GENTAMICIN <=0.5 SENSITIVE Sensitive     OXACILLIN 0.5 SENSITIVE Sensitive     TETRACYCLINE <=1 SENSITIVE Sensitive     VANCOMYCIN 1 SENSITIVE Sensitive     TRIMETH/SULFA <=10 SENSITIVE Sensitive     CLINDAMYCIN <=0.25 SENSITIVE Sensitive     RIFAMPIN <=0.5 SENSITIVE Sensitive     Inducible Clindamycin NEGATIVE Sensitive     * FEW STAPHYLOCOCCUS AUREUS  SARS Coronavirus 2 by RT PCR (hospital order, performed in [facility] hospital lab) Nasopharyngeal Nasopharyngeal Swab     Status: None   Collection Time: 10/18/19  3:54 PM   Specimen: Nasopharyngeal Swab  Result Value Ref Range Status   SARS Coronavirus 2 NEGATIVE NEGATIVE Final    Comment: (NOTE) SARS-CoV-2 target nucleic acids are NOT DETECTED. The SARS-CoV-2 RNA is generally detectable in upper and lower respiratory specimens during the acute phase of infection. The lowest concentration of SARS-CoV-2 viral copies this assay can detect is 250 copies / mL. A negative result does not preclude SARS-CoV-2 infection and should not be used as the sole basis for treatment or other patient management decisions.  A negative result may occur with improper specimen collection / handling, submission of specimen other than nasopharyngeal swab, presence of viral mutation(s) within the areas targeted  by this assay, and inadequate number of viral copies (<250 copies / mL). A negative  result must be combined with clinical observations, patient history, and epidemiological information. Fact Sheet for Patients:   StrictlyIdeas.no Fact Sheet for Healthcare Providers: BankingDealers.co.za This test is not yet approved or cleared  by the [place] FDA and has been authorized for detection and/or diagnosis of SARS-CoV-2 by FDA under an Emergency Use Authorization (EUA).  This EUA will remain in effect (meaning this test can be used) for the duration of the COVID-19 declaration under Section 564(b)(1) of the Act, 21 U.S.C. section 360bbb-3(b)(1), unless the authorization is terminated or revoked sooner. Performed at [facility], [address], [place], [place] [address]   Surgical pcr screen     Status: Abnormal   Collection Time: 10/20/19  2:18 AM   Specimen: Nasal Mucosa; Nasal Swab  Result Value Ref Range Status   MRSA, PCR NEGATIVE NEGATIVE Final   Staphylococcus aureus POSITIVE (A) NEGATIVE Final    Comment: (NOTE) The Xpert SA Assay (FDA approved for NASAL specimens in patients 60 years of age and older), is one component of a comprehensive surveillance program. It is not intended to diagnose infection nor to guide or monitor treatment. Performed at [facility], [address] [place], [place] [address]   Aerobic/Anaerobic Culture (surgical/deep wound)     Status: None (Preliminary result)   Collection Time: 10/20/19 11:39 AM   Specimen: Wound  Result Value Ref Range Status   Specimen Description   Final    WOUND Performed at [facility], [address] [place], [place] [address]    Special Requests   Final    NONE Performed at [facility], [address] [place], [place] [address]    Gram Stain   Final    NO WBC SEEN ABUNDANT GRAM POSITIVE COCCI IN PAIRS    Culture    Final    ABUNDANT PSEUDOMONAS AERUGINOSA CULTURE REINCUBATED FOR BETTER GROWTH Performed at [facility], [place] [address], [place],  [address]    Report Status PENDING  Incomplete     Radiology Studies: No results found.  Scheduled Meds: . aspirin EC  81 mg Oral QPC supper  . chlorhexidine  15 mL Mouth/Throat Once  . Chlorhexidine Gluconate Cloth  6 each Topical Daily  . citalopram  10 mg Oral Daily  . docusate sodium  100 mg Oral Daily  . fluticasone  2 spray Each Nare Daily  . furosemide  20 mg Oral Daily  . gabapentin  300 mg Oral BID  . guaiFENesin  600 mg Oral QPM  . ipratropium  2 spray Each Nare BID  . Ipratropium-Albuterol  1 puff Inhalation Q4H  . loratadine  10 mg Oral QHS  . metoprolol succinate  25 mg Oral Daily  . mometasone-formoterol  2 puff Inhalation BID  . mupirocin ointment  1 application Nasal BID  . pantoprazole  40 mg Oral QAC breakfast  . pravastatin  20 mg Oral q1800  . Restore Wound Care Dressing  1 each Topical Daily  . sacubitril-valsartan  1 tablet Oral BID  . tamsulosin  0.4 mg Oral QPC supper  . tiotropium  18 mcg Inhalation Daily  . vitamin B-12  1,000 mcg Oral Daily   Continuous Infusions: . sodium chloride Stopped (10/21/19 0304)  . piperacillin-tazobactam (ZOSYN)  IV 3.375 g (10/21/19 1113)  . vancomycin 750 mg (10/21/19 1004)     LOS: 3 days   Time spent: 45 minutes  Lorella Nimrod, MD [facility]  If 7PM-7AM, please contact  night-coverage [email or web address]  10/21/2019, 1:25 PM   This record has been created using Systems analyst. Errors have been sought and corrected,but may not always be located. Such creation errors do not reflect on the standard of care.

## 2019-10-21 NOTE — Progress Notes (Signed)
1 Day Post-Op   Subjective/Chief Complaint: Patient seen.  Some significant pain in the left foot overnight but not as bad as prior to surgery.  Main complaint this morning is shortness of breath.   Objective: Vital signs in last 24 hours: Temp:  [97.6 F (36.4 C)-98.6 F (37 C)] 98.1 F (36.7 C) (05/29 0749) Pulse Rate:  [73-79] 76 (05/29 0749) Resp:  [14-18] 17 (05/29 0749) BP: (81-113)/(46-70) 104/70 (05/29 0749) SpO2:  [95 %-100 %] 100 % (05/29 0749) Last BM Date: 10/17/19  Intake/Output from previous day: 05/28 0701 - 05/29 0700 In: 4003.3 [P.O.:240; I.V.:3112.4; IV Piggyback:650.8] Out: 1425 [Urine:1325; Blood:100] Intake/Output this shift: Total I/O In: -  Out: 200 [Urine:200]  The bandage is dry and intact on the left foot.  Upon removal no significant bleeding noted on the bandaging.  The incision is well coapted.  Dorsal flap appears well perfused but there is some signs of early cyanotic changes on the plantar flap.    Lab Results:  Recent Labs    10/21/19 0443  WBC 10.4  HGB 7.9*  HCT 24.4*  PLT 351   BMET Recent Labs    10/20/19 0418 10/21/19 0443  NA 127* 131*  K 4.3 4.4  CL 94* 98  CO2 27 27  GLUCOSE 81 98  BUN 9 9  CREATININE 0.75 0.72  CALCIUM 8.3* 8.2*   PT/INR No results for input(s): LABPROT, INR in the last 72 hours. ABG No results for input(s): PHART, HCO3 in the last 72 hours.  Invalid input(s): PCO2, PO2  Studies/Results: No results found.  Anti-infectives: Anti-infectives (From admission, onward)   Start     Dose/Rate Route Frequency Ordered Stop   10/20/19 0922  piperacillin-tazobactam (ZOSYN) 3.375 GM/50ML IVPB    Note to Pharmacy: Nyra Jabs   : cabinet override      10/20/19 0922 10/20/19 1036   10/19/19 1800  vancomycin (VANCOREADY) IVPB 750 mg/150 mL     750 mg 150 mL/hr over 60 Minutes Intravenous Every 8 hours 10/19/19 1702     10/18/19 1800  vancomycin (VANCOREADY) IVPB 1250 mg/250 mL  Status:   Discontinued     1,250 mg 166.7 mL/hr over 90 Minutes Intravenous Every 12 hours 10/18/19 0355 10/19/19 1702   10/18/19 1100  piperacillin-tazobactam (ZOSYN) IVPB 3.375 g     3.375 g 12.5 mL/hr over 240 Minutes Intravenous Every 8 hours 10/18/19 0352     10/18/19 0230  vancomycin (VANCOCIN) IVPB 1000 mg/200 mL premix     1,000 mg 200 mL/hr over 60 Minutes Intravenous  Once 10/18/19 0154 10/18/19 0320   10/18/19 0200  piperacillin-tazobactam (ZOSYN) IVPB 3.375 g     3.375 g 100 mL/hr over 30 Minutes Intravenous  Once 10/18/19 0154 10/18/19 0412   10/17/19 2330  vancomycin (VANCOCIN) IVPB 1000 mg/200 mL premix     1,000 mg 200 mL/hr over 60 Minutes Intravenous  Once 10/17/19 2321 10/18/19 0057   10/17/19 2330  cefTRIAXone (ROCEPHIN) 2 g in sodium chloride 0.9 % 100 mL IVPB     2 g 200 mL/hr over 30 Minutes Intravenous  Once 10/17/19 2321 10/18/19 0057      Assessment/Plan: s/p Procedure(s): TRANSMETATARSAL AMPUTATION (Left) Assessment: Status post TMA left foot   Plan: Betadine applied to the incision followed by a sterile gauze dressing.  Discussed with the patient my concerns about the appearance on the bottom of his foot.  At this point we will plan for dressing change on Monday morning for  reevaluation.  Patient should continue nonweightbearing on the left foot at this time.  We may reinitiate his Coumadin.  LOS: 3 days    Durward Fortes 10/21/2019

## 2019-10-21 NOTE — Progress Notes (Signed)
   10/21/19 1457  Assess: MEWS Score  BP (!) 78/61  Pulse Rate 73  SpO2 100 %  O2 Device Nasal Cannula  O2 Flow Rate (L/min) 3 L/min  Assess: MEWS Score  MEWS Temp 0  MEWS Systolic 2  MEWS Pulse 0  MEWS RR 0  MEWS LOC 0  MEWS Score 2  MEWS Score Color Yellow  Assess: if the MEWS score is Yellow or Red  Were vital signs taken at a resting state? Yes  Focused Assessment Documented focused assessment  Early Detection of Sepsis Score *See Row Information* Low  MEWS guidelines implemented *See Row Information* Yes  Treat  MEWS Interventions Administered scheduled meds/treatments  Take Vital Signs  Increase Vital Sign Frequency  Yellow: Q 2hr X 2 then Q 4hr X 2, if remains yellow, continue Q 4hrs  Escalate  MEWS: Escalate Yellow: discuss with charge nurse/RN and consider discussing with provider and RRT  Notify: Charge Nurse/RN  Name of Charge Nurse/RN Notified Estill Bamberg RN  Date Charge Nurse/RN Notified 10/21/19  Time Charge Nurse/RN Notified 1457  Notify: Provider  Provider Name/Title Dr Reesa Chew  Date Provider Notified 10/21/19  Time Provider Notified 1457  Notification Type Call  Notification Reason Other (Comment) (low BP)  Response See new orders (540ml bolus ordered)  Date of Provider Response 10/21/19  Time of Provider Response 1500  Document  Patient Outcome Stabilized after interventions  Progress note created (see row info) Yes

## 2019-10-22 DIAGNOSIS — M869 Osteomyelitis, unspecified: Principal | ICD-10-CM

## 2019-10-22 LAB — CBC
HCT: 23.5 % — ABNORMAL LOW (ref 39.0–52.0)
Hemoglobin: 7.6 g/dL — ABNORMAL LOW (ref 13.0–17.0)
MCH: 29.1 pg (ref 26.0–34.0)
MCHC: 32.3 g/dL (ref 30.0–36.0)
MCV: 90 fL (ref 80.0–100.0)
Platelets: 320 10*3/uL (ref 150–400)
RBC: 2.61 MIL/uL — ABNORMAL LOW (ref 4.22–5.81)
RDW: 13.2 % (ref 11.5–15.5)
WBC: 10.5 10*3/uL (ref 4.0–10.5)
nRBC: 0 % (ref 0.0–0.2)

## 2019-10-22 LAB — BASIC METABOLIC PANEL
Anion gap: 6 (ref 5–15)
BUN: 9 mg/dL (ref 8–23)
CO2: 26 mmol/L (ref 22–32)
Calcium: 8.2 mg/dL — ABNORMAL LOW (ref 8.9–10.3)
Chloride: 99 mmol/L (ref 98–111)
Creatinine, Ser: 0.7 mg/dL (ref 0.61–1.24)
GFR calc Af Amer: >60 mL/min (ref >60)
GFR calc non Af Amer: >60 mL/min (ref >60)
Glucose, Bld: 90 mg/dL (ref 70–99)
Potassium: 4.4 mmol/L (ref 3.5–5.1)
Sodium: 131 mmol/L — ABNORMAL LOW (ref 135–145)

## 2019-10-22 LAB — IRON AND TIBC
Iron: 11 ug/dL — ABNORMAL LOW (ref 45–182)
Saturation Ratios: 6 % — ABNORMAL LOW (ref 17.9–39.5)
TIBC: 199 ug/dL — ABNORMAL LOW (ref 250–450)
UIBC: 188 ug/dL

## 2019-10-22 LAB — CULTURE, BLOOD (ROUTINE X 2)
Culture: NO GROWTH
Special Requests: ADEQUATE

## 2019-10-22 LAB — FERRITIN: Ferritin: 220 ng/mL (ref 24–336)

## 2019-10-22 MED ORDER — WARFARIN - PHARMACIST DOSING INPATIENT: Freq: Every day | Status: DC

## 2019-10-22 MED ORDER — BISACODYL 10 MG RE SUPP: 10.0000 mg | Freq: Every day | RECTAL | Status: DC | PRN

## 2019-10-22 MED ORDER — WARFARIN SODIUM 5 MG PO TABS
5.0000 mg | ORAL_TABLET | Freq: Once | ORAL | Status: AC
  Administered 2019-10-22: 5 mg via ORAL
  Filled 2019-10-22: qty 1

## 2019-10-22 MED ORDER — POLYETHYLENE GLYCOL 3350 17 G PO PACK
17.0000 g | PACK | Freq: Every day | ORAL | Status: DC | PRN
  Administered 2019-10-23: 17 g via ORAL
  Filled 2019-10-22: qty 1

## 2019-10-22 MED ORDER — BISACODYL 5 MG PO TBEC
5.0000 mg | DELAYED_RELEASE_TABLET | Freq: Every day | ORAL | Status: DC | PRN
  Filled 2019-10-22: qty 1

## 2019-10-22 NOTE — Progress Notes (Signed)
PROGRESS NOTE    Tyler [name]  [id number] DOB: 22-Aug-1955 DOA: 10/17/2019 PCP: [name], MD   Brief Narrative:  Tyler [name]  is a 64 y.o. Caucasian male with a known history of CHF, COPD, coronary disease, atrial fibrillation, hypertension and peripheral vascular disease, status post AICD, presented to emergency room with acute onset of worsening left foot swelling with erythema and discharge.  Patient had his left big toe amputated on 3/15 by Dr. Vickki Muff and later on had his second and third toes amputated on 5/14.  His erythema has been extending to his left flank.  No fever or chills, nausea or vomiting or abdominal pain.  No chest pain or palpitations.  No dysuria, oliguria or hematuria or flank pain. Started on broad-spectrum antibiotics and podiatry was consulted.  They are planning to do transmetatarsal amputation.  Subjective:  Patient feels relieved after having had a bowel movement which was large.  He is embarrassed that he has soiled the bed and feels that he is "not a good patient".  He was in good spirits denies shortness of breath different from baseline.  Assessment & Plan:   Active Problems:   Osteomyelitis of left leg (HCC)  Left lower extremity severe nonpurulent cellulitis with associated left foot osteomyelitis and gangrene with purulent cellulitis. S/p transmetatarsal amputation 10/20/19 per podiatry Continue vancomycin and Zosyn. Podiatry will reevaluate him on Monday, according to their note there was some concern about the bottom of his foot which appears little cyanosed, picture in Dr.Cline's note. Would like to go back home with [facility] if possible after surgery.  Postsurgical anemia.  Hemoglobin dropped to 7.9, is stable upon repeat.  COPD without current exacerbation. Continue ontinue his Advair Diskus and Atrovent.  CHF   Patient with COPD and CHF. Continue present management with Entresto. X-rays without any acute  abnormalities.  Hypertension Well-controlled on Entresto   Anxiety and depression. We will continue Celexa and Xanax.  Peripheral neuropathy. Continue Neurontin.  BPH. Continue Flomax.    Objective: Vitals:   10/22/19 0307 10/22/19 0701 10/22/19 1007 10/22/19 1543  BP: 102/68 138/88 108/79 (!) 103/57  Pulse: 75 65 88 74  Resp:   (!) 24 (!) 22  Temp:   98.2 F (36.8 C) 98.3 F (36.8 C)  TempSrc:   Oral Oral  SpO2:  92% 100% 100%  Weight:      Height:        Intake/Output Summary (Last 24 hours) at 10/22/2019 1724 Last data filed at 10/22/2019 1654 Gross per 24 hour  Intake 1089.58 ml  Output 1650 ml  Net -560.42 ml   Filed Weights   10/17/19 1715 10/20/19 0903  Weight: 74.8 kg 74.8 kg    Examination:  General exam: Appears calm and comfortable  Respiratory system: Clear to auscultation. Respiratory effort normal. Cardiovascular system: S1 & S2 heard, RRR. No JVD, murmurs, rubs, gallops or clicks. Gastrointestinal system: Soft, nontender, nondistended, bowel sounds positive. Central nervous system: Alert and oriented. No focal neurological deficits.Symmetric 5 x 5 power. Extremities: Right AKA, left foot with clean bulky bandage and Ace wrap. Skin: No rashes, lesions or ulcers Psychiatry: Judgement and insight appear normal. Mood & affect appropriate.    DVT prophylaxis: SCDs Code Status: Full Family Communication: Family at bedside. Disposition Plan:  Status is: Inpatient   Remains inpatient appropriate because:Inpatient level of care appropriate due to severity of illness   Dispo: The patient is from: Home  Anticipated d/c is to: Home              Anticipated d/c date is: 2-3 days.              Patient currently is not medically stable to d/c.  Patient wants to go back home.  TOC is looking for his options after TMA.  Might need a revision of his amputation.  Podiatry is going to reevaluate him on Monday.   Consultants:    Podiatry  Procedures:  Antimicrobials: Vancomycin Zosyn  Data Reviewed: I have personally reviewed following labs and imaging studies  CBC: Recent Labs  Lab 10/17/19 1720 10/18/19 0632 10/21/19 0443 10/22/19 0410  WBC 10.2 10.3 10.4 10.5  NEUTROABS 7.6  --   --   --   HGB 10.0* 9.5* 7.9* 7.6*  HCT 30.4* 28.8* 24.4* 23.5*  MCV 87.6 89.2 90.4 90.0  PLT 362 375 351 [id number]   Basic Metabolic Panel: Recent Labs  Lab 10/17/19 1720 10/17/19 1720 10/18/19 0632 10/19/19 0904 10/20/19 0418 10/21/19 0443 10/22/19 0410  NA 126*  --  129*  --  127* 131* 131*  K 4.7  --  4.1  --  4.3 4.4 4.4  CL 88*  --  91*  --  94* 98 99  CO2 29  --  29  --  27 27 26   GLUCOSE 102*  --  87  --  81 98 90  BUN 6*  --  6*  --  9 9 9   CREATININE 0.71   < > 0.78 0.74 0.75 0.72 0.70  CALCIUM 8.9  --  8.7*  --  8.3* 8.2* 8.2*   < > = values in this interval not displayed.   GFR: Estimated Creatinine Clearance: 98.7 mL/min (by C-G formula based on SCr of 0.7 mg/dL). Liver Function Tests: Recent Labs  Lab 10/17/19 1720  AST 23  ALT 14  ALKPHOS 69  BILITOT 0.5  PROT 6.7  ALBUMIN 3.3*   No results for input(s): LIPASE, AMYLASE in the last 168 hours. No results for input(s): AMMONIA in the last 168 hours. Coagulation Profile: Recent Labs  Lab 10/17/19 1720  INR 2.3*   Cardiac Enzymes: No results for input(s): CKTOTAL, CKMB, CKMBINDEX, TROPONINI in the last 168 hours. BNP (last 3 results) No results for input(s): PROBNP in the last 8760 hours. HbA1C: No results for input(s): HGBA1C in the last 72 hours. CBG: No results for input(s): GLUCAP in the last 168 hours. Lipid Profile: No results for input(s): CHOL, HDL, LDLCALC, TRIG, CHOLHDL, LDLDIRECT in the last 72 hours. Thyroid Function Tests: No results for input(s): TSH, T4TOTAL, FREET4, T3FREE, THYROIDAB in the last 72 hours. Anemia Panel: Recent Labs    10/22/19 0410  FERRITIN 220  TIBC 199*  IRON 11*   Sepsis Labs: Recent  Labs  Lab 10/17/19 1720 10/17/19 1920  LATICACIDVEN 0.9 1.2    Recent Results (from the past 240 hour(s))  Culture, blood (Routine x 2)     Status: None   Collection Time: 10/17/19  5:20 PM   Specimen: BLOOD  Result Value Ref Range Status   Specimen Description BLOOD RIGHT ANTECUBITAL  Final   Special Requests   Final    BOTTLES DRAWN AEROBIC AND ANAEROBIC Blood Culture adequate volume   Culture   Final    NO GROWTH 5 DAYS Performed at [facility], [address], [place], [place] [address]    Report Status 10/22/2019 FINAL  Final  Culture,  blood (Routine x 2)     Status: None (Preliminary result)   Collection Time: 10/17/19  5:25 PM   Specimen: BLOOD LEFT HAND  Result Value Ref Range Status   Specimen Description BLOOD LEFT HAND  Final   Special Requests   Final    BOTTLES DRAWN AEROBIC AND ANAEROBIC Blood Culture adequate volume   Culture   Final    NO GROWTH 4 DAYS Performed at [facility], [address], [place], [place] [address]    Report Status PENDING  Incomplete  Aerobic/Anaerobic Culture (surgical/deep wound)     Status: None   Collection Time: 10/18/19  1:05 PM   Specimen: Foot; Abscess  Result Value Ref Range Status   Specimen Description   Final    FOOT LEFT FOOT Performed at [facility], [address] [place], [place] [address]    Special Requests   Final    NONE Performed at [facility], [address] [place], [place] [address]    Gram Stain   Final    ABUNDANT WBC PRESENT, PREDOMINANTLY PMN ABUNDANT GRAM POSITIVE COCCI ABUNDANT GRAM NEGATIVE RODS    Culture   Final    ABUNDANT PSEUDOMONAS AERUGINOSA FEW STAPHYLOCOCCUS AUREUS FEW GROUP B STREP(S.AGALACTIAE)ISOLATED TESTING AGAINST S. AGALACTIAE NOT ROUTINELY PERFORMED DUE TO PREDICTABILITY OF AMP/PEN/VAN SUSCEPTIBILITY. NO ANAEROBES ISOLATED Performed at [facility], [place] [address] [place], [place] [address]    Report Status 10/21/2019  FINAL  Final   Organism ID, Bacteria PSEUDOMONAS AERUGINOSA  Final   Organism ID, Bacteria STAPHYLOCOCCUS AUREUS  Final      Susceptibility   Pseudomonas aeruginosa - MIC*    CEFTAZIDIME 2 SENSITIVE Sensitive     CIPROFLOXACIN <=0.25 SENSITIVE Sensitive     GENTAMICIN <=1 SENSITIVE Sensitive     IMIPENEM 2 SENSITIVE Sensitive     PIP/TAZO 8 SENSITIVE Sensitive     CEFEPIME <=1 SENSITIVE Sensitive     * ABUNDANT PSEUDOMONAS AERUGINOSA   Staphylococcus aureus - MIC*    CIPROFLOXACIN <=0.5 SENSITIVE Sensitive     ERYTHROMYCIN >=8 RESISTANT Resistant     GENTAMICIN <=0.5 SENSITIVE Sensitive     OXACILLIN 0.5 SENSITIVE Sensitive     TETRACYCLINE <=1 SENSITIVE Sensitive     VANCOMYCIN 1 SENSITIVE Sensitive     TRIMETH/SULFA <=10 SENSITIVE Sensitive     CLINDAMYCIN <=0.25 SENSITIVE Sensitive     RIFAMPIN <=0.5 SENSITIVE Sensitive     Inducible Clindamycin NEGATIVE Sensitive     * FEW STAPHYLOCOCCUS AUREUS  SARS Coronavirus 2 by RT PCR (hospital order, performed in [place] hospital lab) Nasopharyngeal Nasopharyngeal Swab     Status: None   Collection Time: 10/18/19  3:54 PM   Specimen: Nasopharyngeal Swab  Result Value Ref Range Status   SARS Coronavirus 2 NEGATIVE NEGATIVE Final    Comment: (NOTE) SARS-CoV-2 target nucleic acids are NOT DETECTED. The SARS-CoV-2 RNA is generally detectable in upper and lower respiratory specimens during the acute phase of infection. The lowest concentration of SARS-CoV-2 viral copies this assay can detect is 250 copies / mL. A negative result does not preclude SARS-CoV-2 infection and should not be used as the sole basis for treatment or other patient management decisions.  A negative result may occur with improper specimen collection / handling, submission of specimen other than nasopharyngeal swab, presence of viral mutation(s) within the areas targeted by this assay, and inadequate number of viral copies (<250 copies / mL). A negative result  must be  combined with clinical observations, patient history, and epidemiological information. Fact Sheet for Patients:   StrictlyIdeas.no Fact Sheet for Healthcare Providers: BankingDealers.co.za This test is not yet approved or cleared  by the [place] FDA and has been authorized for detection and/or diagnosis of SARS-CoV-2 by FDA under an Emergency Use Authorization (EUA).  This EUA will remain in effect (meaning this test can be used) for the duration of the COVID-19 declaration under Section 564(b)(1) of the Act, 21 U.S.C. section 360bbb-3(b)(1), unless the authorization is terminated or revoked sooner. Performed at [facility], [address] [place], [place] [address]   Surgical pcr screen     Status: Abnormal   Collection Time: 10/20/19  2:18 AM   Specimen: Nasal Mucosa; Nasal Swab  Result Value Ref Range Status   MRSA, PCR NEGATIVE NEGATIVE Final   Staphylococcus aureus POSITIVE (A) NEGATIVE Final    Comment: (NOTE) The Xpert SA Assay (FDA approved for NASAL specimens in patients 61 years of age and older), is one component of a comprehensive surveillance program. It is not intended to diagnose infection nor to guide or monitor treatment. Performed at [facility], [address] [place], [place] [address]   Aerobic/Anaerobic Culture (surgical/deep wound)     Status: None (Preliminary result)   Collection Time: 10/20/19 11:39 AM   Specimen: Wound  Result Value Ref Range Status   Specimen Description   Final    WOUND Performed at [facility], [address], [place], [place] [address]    Special Requests   Final    NONE Performed at [facility], [address] [place], [place] [address]    Gram Stain   Final    NO WBC SEEN ABUNDANT GRAM POSITIVE COCCI IN PAIRS    Culture   Final    ABUNDANT PSEUDOMONAS AERUGINOSA FEW STAPHYLOCOCCUS AUREUS SUSCEPTIBILITIES TO  FOLLOW HOLDING FOR POSSIBLE ANAEROBE Performed at [facility], [place] [address] [place], [place] [address]    Report Status PENDING  Incomplete   Organism ID, Bacteria PSEUDOMONAS AERUGINOSA  Final      Susceptibility   Pseudomonas aeruginosa - MIC*    CEFTAZIDIME 16 INTERMEDIATE Intermediate     CIPROFLOXACIN <=0.25 SENSITIVE Sensitive     GENTAMICIN <=1 SENSITIVE Sensitive     IMIPENEM 2 SENSITIVE Sensitive     CEFEPIME INTERMEDIATE Intermediate     * ABUNDANT PSEUDOMONAS AERUGINOSA     Radiology Studies: DG Chest Port 1 View  Result Date: 10/21/2019 CLINICAL DATA:  Increased shortness of breath. EXAM: PORTABLE CHEST 1 VIEW COMPARISON:  Oct 17, 2019 FINDINGS: Stable pacemaker. Stable AICD device. The hila and mediastinum are unremarkable. No pneumothorax. No nodules or masses. There is a small right pleural effusion. Mild interstitial prominence centrally suggest pulmonary venous congestion. No overt edema. No focal infiltrate. No nodule or mass. IMPRESSION: 1. Findings are most consistent with cardiomegaly, pulmonary venous congestion, and a small right pleural effusion. Electronically Signed   By: Dorise Bullion III M.D   On: 10/21/2019 13:36    Scheduled Meds: . aspirin EC  81 mg Oral QPC supper  . chlorhexidine  15 mL Mouth/Throat Once  . Chlorhexidine Gluconate Cloth  6 each Topical Daily  . citalopram  10 mg Oral Daily  . docusate sodium  100 mg Oral Daily  . fluticasone  2 spray Each Nare Daily  . furosemide  20 mg Oral Daily  . gabapentin  300 mg Oral BID  . guaiFENesin  600 mg Oral QPM  . ipratropium  2 spray Each Nare BID  . Ipratropium-Albuterol  1 puff Inhalation Q4H  . loratadine  10 mg Oral QHS  . metoprolol succinate  25 mg Oral Daily  . mometasone-formoterol  2 puff Inhalation BID  . mupirocin ointment  1 application Nasal BID  . pantoprazole  40 mg Oral QAC breakfast  . pravastatin  20 mg Oral q1800  . Restore Wound Care Dressing  1 each Topical Daily   . sacubitril-valsartan  1 tablet Oral BID  . tamsulosin  0.4 mg Oral QPC supper  . tiotropium  18 mcg Inhalation Daily  . vitamin B-12  1,000 mcg Oral Daily  . Warfarin - Pharmacist Dosing Inpatient   Does not apply q1600   Continuous Infusions: . sodium chloride Stopped (10/22/19 1112)  . piperacillin-tazobactam (ZOSYN)  IV Stopped (10/22/19 1516)     LOS: 4 days   Time spent: 45 minutes  Dewaine Oats Derek Jack, MD [facility]  If 7PM-7AM, please contact night-coverage [email or web address]  10/22/2019, 5:24 PM   This record has been created using Systems analyst. Errors have been sought and corrected,but may not always be located. Such creation errors do not reflect on the standard of care.

## 2019-10-22 NOTE — Progress Notes (Signed)
ANTICOAGULATION CONSULT NOTE - Initial Consult  Pharmacy Consult for Warfarin Indication: atrial fibrillation  Allergies  Allergen Reactions  . Apixaban Rash  . Rivaroxaban Rash    Patient Measurements: Height: 6\' 1"  (185.4 cm) Weight: 74.8 kg (164 lb 14.5 oz) IBW/kg (Calculated) : 79.9 Heparin Dosing Weight:    Vital Signs: Temp: 98.2 F (36.8 C) (05/30 1007) Temp Source: Oral (05/30 1007) BP: 108/79 (05/30 1007) Pulse Rate: 88 (05/30 1007)  Labs: Recent Labs    10/20/19 0418 10/21/19 0443 10/22/19 0410  HGB  --  7.9* 7.6*  HCT  --  24.4* 23.5*  PLT  --  351 320  CREATININE 0.75 0.72 0.70    Estimated Creatinine Clearance: 98.7 mL/min (by C-G formula based on SCr of 0.7 mg/dL).   Medical History: Past Medical History:  Diagnosis Date  . AICD (automatic cardioverter/defibrillator) present   . Anxiety   . Arthritis   . Atherosclerosis of artery of extremity with ulceration ([place]) 09/2019   left foot s/p toe amp requiring debridement and futher toe amputations.  . Atrial fibrillation ([place])   . Cervical spinal stenosis    with neuropathy  . CHF (congestive heart failure) ([place])   . Constipation   . COPD (chronic obstructive pulmonary disease) ([place])   . Coronary artery disease   . Depression   . Dyspnea   . Dysrhythmia    atrial fibrillation  . Emphysema of lung ([place])   . GERD (gastroesophageal reflux disease)   . Hypertension   . Lung nodule seen on imaging study    being followed by dr. [name]. just watching it for last few years, without change  . Myocardial infarction [facility]) 2004   stent placed, pacemaker implanted 2005  . Oxygen dependent    requires 2L nasal prong oxygen 24 hours a day  . Peripheral vascular disease ([place])   . Presence of permanent cardiac pacemaker [phone]    Medications:  Scheduled:  . aspirin EC  81 mg Oral QPC supper  . chlorhexidine  15 mL Mouth/Throat Once  . Chlorhexidine Gluconate Cloth  6 each Topical Daily  .  citalopram  10 mg Oral Daily  . docusate sodium  100 mg Oral Daily  . fluticasone  2 spray Each Nare Daily  . furosemide  20 mg Oral Daily  . gabapentin  300 mg Oral BID  . guaiFENesin  600 mg Oral QPM  . ipratropium  2 spray Each Nare BID  . Ipratropium-Albuterol  1 puff Inhalation Q4H  . loratadine  10 mg Oral QHS  . metoprolol succinate  25 mg Oral Daily  . mometasone-formoterol  2 puff Inhalation BID  . mupirocin ointment  1 application Nasal BID  . pantoprazole  40 mg Oral QAC breakfast  . pravastatin  20 mg Oral q1800  . Restore Wound Care Dressing  1 each Topical Daily  . sacubitril-valsartan  1 tablet Oral BID  . tamsulosin  0.4 mg Oral QPC supper  . tiotropium  18 mcg Inhalation Daily  . vitamin B-12  1,000 mcg Oral Daily  . warfarin  5 mg Oral ONCE-1600  . Warfarin - Pharmacist Dosing Inpatient   Does not apply q1600   Infusions:  . sodium chloride 250 mL (10/22/19 1111)  . piperacillin-tazobactam (ZOSYN)  IV 3.375 g (10/22/19 1112)  . vancomycin 750 mg (10/22/19 1127)    Assessment: 64 yo male on Warfarin for Atrial Fibrillation. Warfarin had been stopped for surgery, now resuming. Home dose: Warfarin 5 mg  daily  5/25 INR 2.3 * no warfarin given since  5/31   Goal of Therapy:  INR 2-3 Monitor platelets by anticoagulation protocol: Yes   Plan:  Resume pt home dose of Warfarin 5 mg daily. F/u INR in am  Geraldy Akridge A 10/22/2019,12:10 PM

## 2019-10-23 LAB — AEROBIC/ANAEROBIC CULTURE W GRAM STAIN (SURGICAL/DEEP WOUND): Gram Stain: NONE SEEN

## 2019-10-23 LAB — CBC
HCT: 22.5 % — ABNORMAL LOW (ref 39.0–52.0)
Hemoglobin: 7.7 g/dL — ABNORMAL LOW (ref 13.0–17.0)
MCH: 29.6 pg (ref 26.0–34.0)
MCHC: 34.2 g/dL (ref 30.0–36.0)
MCV: 86.5 fL (ref 80.0–100.0)
Platelets: 279 10*3/uL (ref 150–400)
RBC: 2.6 MIL/uL — ABNORMAL LOW (ref 4.22–5.81)
RDW: 13.4 % (ref 11.5–15.5)
WBC: 8.8 10*3/uL (ref 4.0–10.5)
nRBC: 0 % (ref 0.0–0.2)

## 2019-10-23 LAB — BASIC METABOLIC PANEL
Anion gap: 7 (ref 5–15)
BUN: 8 mg/dL (ref 8–23)
CO2: 27 mmol/L (ref 22–32)
Calcium: 8.5 mg/dL — ABNORMAL LOW (ref 8.9–10.3)
Chloride: 95 mmol/L — ABNORMAL LOW (ref 98–111)
Creatinine, Ser: 0.74 mg/dL (ref 0.61–1.24)
GFR calc Af Amer: >60 mL/min (ref >60)
GFR calc non Af Amer: >60 mL/min (ref >60)
Glucose, Bld: 104 mg/dL — ABNORMAL HIGH (ref 70–99)
Potassium: 4.2 mmol/L (ref 3.5–5.1)
Sodium: 129 mmol/L — ABNORMAL LOW (ref 135–145)

## 2019-10-23 LAB — CULTURE, BLOOD (ROUTINE X 2)
Culture: NO GROWTH
Special Requests: ADEQUATE

## 2019-10-23 LAB — PROTIME-INR
INR: 1.5 — ABNORMAL HIGH (ref 0.8–1.2)
Prothrombin Time: 17.5 seconds — ABNORMAL HIGH (ref 11.4–15.2)

## 2019-10-23 MED ORDER — WARFARIN SODIUM 5 MG PO TABS
5.0000 mg | ORAL_TABLET | Freq: Once | ORAL | Status: DC
  Filled 2019-10-23: qty 1

## 2019-10-23 MED ORDER — IPRATROPIUM-ALBUTEROL 0.5-2.5 (3) MG/3ML IN SOLN
3.0000 mL | Freq: Four times a day (QID) | RESPIRATORY_TRACT | Status: DC
  Administered 2019-10-23 – 2019-10-24 (×5): 3 mL via RESPIRATORY_TRACT
  Filled 2019-10-23 (×5): qty 3

## 2019-10-23 MED ORDER — WARFARIN SODIUM 4 MG PO TABS
4.0000 mg | ORAL_TABLET | Freq: Once | ORAL | Status: AC
  Administered 2019-10-23: 4 mg via ORAL
  Filled 2019-10-23: qty 1

## 2019-10-23 MED ORDER — CIPROFLOXACIN HCL 500 MG PO TABS
500.0000 mg | ORAL_TABLET | Freq: Two times a day (BID) | ORAL | Status: DC
  Administered 2019-10-23 – 2019-10-24 (×2): 500 mg via ORAL
  Filled 2019-10-23 (×4): qty 1

## 2019-10-23 MED ORDER — BUDESONIDE 0.25 MG/2ML IN SUSP
0.2500 mg | Freq: Two times a day (BID) | RESPIRATORY_TRACT | Status: DC
  Administered 2019-10-23 – 2019-10-24 (×2): 0.25 mg via RESPIRATORY_TRACT
  Filled 2019-10-23 (×2): qty 2

## 2019-10-23 NOTE — Progress Notes (Addendum)
ANTICOAGULATION CONSULT NOTE - Initial Consult  Pharmacy Consult for Warfarin Indication: atrial fibrillation  Allergies  Allergen Reactions  . Apixaban Rash  . Rivaroxaban Rash    Patient Measurements: Height: 6\' 1"  (185.4 cm) Weight: 74.8 kg (164 lb 14.5 oz) IBW/kg (Calculated) : 79.9 Heparin Dosing Weight:    Vital Signs: Temp: 97.9 F (36.6 C) (05/31 0756) Temp Source: Oral (05/31 0756) BP: 137/65 (05/31 0756) Pulse Rate: 71 (05/31 0756)  Labs: Recent Labs    10/21/19 0443 10/21/19 0443 10/22/19 0410 10/23/19 0503  HGB 7.9*   < > 7.6* 7.7*  HCT 24.4*  --  23.5* 22.5*  PLT 351  --  320 279  LABPROT  --   --   --  17.5*  INR  --   --   --  1.5*  CREATININE 0.72  --  0.70 0.74   < > = values in this interval not displayed.    Estimated Creatinine Clearance: 98.7 mL/min (by C-G formula based on SCr of 0.74 mg/dL).   Medical History: Past Medical History:  Diagnosis Date  . AICD (automatic cardioverter/defibrillator) present   . Anxiety   . Arthritis   . Atherosclerosis of artery of extremity with ulceration ([place]) 09/2019   left foot s/p toe amp requiring debridement and futher toe amputations.  . Atrial fibrillation ([place])   . Cervical spinal stenosis    with neuropathy  . CHF (congestive heart failure) ([place])   . Constipation   . COPD (chronic obstructive pulmonary disease) ([place])   . Coronary artery disease   . Depression   . Dyspnea   . Dysrhythmia    atrial fibrillation  . Emphysema of lung ([place])   . GERD (gastroesophageal reflux disease)   . Hypertension   . Lung nodule seen on imaging study    being followed by dr. [name]. just watching it for last few years, without change  . Myocardial infarction [facility]) 2004   stent placed, pacemaker implanted 2005  . Oxygen dependent    requires 2L nasal prong oxygen 24 hours a day  . Peripheral vascular disease ([place])   . Presence of permanent cardiac pacemaker [phone]    Medications:  Scheduled:  .  aspirin EC  81 mg Oral QPC supper  . chlorhexidine  15 mL Mouth/Throat Once  . Chlorhexidine Gluconate Cloth  6 each Topical Daily  . citalopram  10 mg Oral Daily  . docusate sodium  100 mg Oral Daily  . fluticasone  2 spray Each Nare Daily  . furosemide  20 mg Oral Daily  . gabapentin  300 mg Oral BID  . guaiFENesin  600 mg Oral QPM  . ipratropium  2 spray Each Nare BID  . Ipratropium-Albuterol  1 puff Inhalation Q4H  . loratadine  10 mg Oral QHS  . metoprolol succinate  25 mg Oral Daily  . mometasone-formoterol  2 puff Inhalation BID  . mupirocin ointment  1 application Nasal BID  . pantoprazole  40 mg Oral QAC breakfast  . pravastatin  20 mg Oral q1800  . Restore Wound Care Dressing  1 each Topical Daily  . sacubitril-valsartan  1 tablet Oral BID  . tamsulosin  0.4 mg Oral QPC supper  . tiotropium  18 mcg Inhalation Daily  . vitamin B-12  1,000 mcg Oral Daily  . Warfarin - Pharmacist Dosing Inpatient   Does not apply q1600   Infusions:  . sodium chloride 250 mL (10/23/19 0800)  . piperacillin-tazobactam (ZOSYN)  IV  3.375 g (10/23/19 0802)    Assessment: 64 yo male on Warfarin for Atrial Fibrillation. Warfarin had been stopped for surgery, now resuming. No doses have been given since 5/25.   Home dose: Warfarin 5 mg daily  DATE INR DOSE 5/25 2.3  ------ 5/30 ----    5mg  5/31 1.5  Goal of Therapy:  INR 2-3 Monitor platelets by anticoagulation protocol: Yes   Plan:  Will order reduced dose of  Warfarin 4 mg tonight. Patient is receiving Abx, which can increase INR. Cipro started 5/31  F/u INR in am CBC at least every 3 days per protocol. INRs daily while on Abx.   Pernell Dupre, PharmD, BCPS Clinical Pharmacist 10/23/2019 9:54 AM

## 2019-10-23 NOTE — TOC Progression Note (Signed)
Transition of Care [facility]) - Progression Note    Patient Details  Name: Tyler [name] MRN: [id number] Date of Birth: Oct 10, 1955  Transition of [facility] CM/SW [place], RN Phone Number: 10/23/2019, 12:13 PM  Clinical Narrative:     Met with the patient at his request, He stated that they have delivered the hoyer lift to his house, he was curious to know which one of his doctors would be the best to set up an electric wheelchair, he currently has a manual wheelchair as well as a transport chair, I let him know that any of his doctors would be able to set up for an electric wheelchair but the podiatrist may have more experience with is, he is going to see his podiatrist in the next few days Expected Discharge Plan: [place] Barriers to Discharge: Continued Medical Work up  Expected Discharge Plan and Services Expected Discharge Plan: [place]   Discharge Planning Services: CM Consult   Living arrangements for the past 2 months: Single Family Home                 DME Arranged: Other see comment(Hoyer Lift) DME Agency: [employer] Date DME Agency Contacted: 10/20/19 Time DME Agency Contacted: [phone] Representative spoke with at DME Agency: zack HH Arranged: PT, RN, OT, Nurse's Aide [place] Agency: [facility] (now [facility]) Date [place]: 10/20/19 Time [place]: 1635 Representative spoke with at [place]: [place] ([place]) Interventions    Readmission Risk Interventions No flowsheet data found.

## 2019-10-23 NOTE — Progress Notes (Signed)
Pharmacy Antibiotic Note  Tyler [name] is a 64 y.o. male admitted on 10/17/2019 with cellulitis and osteomyelitis of the left foot.  Pharmacy has been consulted for Vancomycin and Zosyn dosing.  Patient underwent left big toe amputation on 3/15, and had second and third toes amputated on 5/14.  Wound cultures from 5/14 show MRSA and Pseudomonas.  Transmetatarsal amputation occurred this visit on 5/28.  Vancomycin DCd 5/30    Plan: Day 7 of antibiotics  Zosyn Continue Zosyn 3.375 g IV q 8 hours  Height: 6\' 1"  (185.4 cm) Weight: 74.8 kg (164 lb 14.5 oz) IBW/kg (Calculated) : 79.9  Temp (24hrs), Avg:98 F (36.7 C), Min:97.4 F (36.3 C), Max:98.3 F (36.8 C)  Recent Labs  Lab 10/17/19 1720 10/17/19 1720 10/17/19 1920 10/18/19 [id number] 10/18/19 [id number] 10/19/19 0904 10/20/19 0418 10/21/19 0443 10/22/19 0410 10/23/19 0503  WBC 10.2  --   --  10.3  --   --   --  10.4 10.5 8.8  CREATININE 0.71   < >  --  0.78   < > 0.74 0.75 0.72 0.70 0.74  LATICACIDVEN 0.9  --  1.2  --   --   --   --   --   --   --    < > = values in this interval not displayed.    Estimated Creatinine Clearance: 98.7 mL/min (by C-G formula based on SCr of 0.74 mg/dL).    Allergies  Allergen Reactions  . Apixaban Rash  . Rivaroxaban Rash    Antimicrobials this admission:  5/25 Ceftriaxone x 1 5/26 Zosyn >>  5/25 Vancomycin >> 5/30  Dose adjustments this admission: Vancomycin 1250 mg q 12 hr >> 750 mg q 8 hr  Microbiology results: 5/26 WCx (foot abscess) Abundant Pseudomonas, few MSSA 5/28 MRSA PCR positive 5/26 COVID (nasopharyngeal swab) negative 5/25 BCx NG x 3   Thank you for allowing pharmacy to be a part of this patient's care.  Pernell Dupre, PharmD, BCPS Clinical Pharmacist 10/23/2019 9:59 AM

## 2019-10-23 NOTE — Progress Notes (Addendum)
3 Days Post-Op   Subjective/Chief Complaint: Patient seen.  Still having some significant pain in the left foot but does okay as long as he is on his pain medication.  Still some concerns over his breathing.   Objective: Vital signs in last 24 hours: Temp:  [97.4 F (36.3 C)-98.3 F (36.8 C)] 97.9 F (36.6 C) (05/31 0756) Pulse Rate:  [71-77] 71 (05/31 0756) Resp:  [16-22] 17 (05/31 0756) BP: (103-137)/(57-90) 137/65 (05/31 0756) SpO2:  [92 %-100 %] 92 % (05/31 0756) Last BM Date: 10/22/19  Intake/Output from previous day: 05/30 0701 - 05/31 0700 In: 296.7 [I.V.:0.1; IV Piggyback:296.5] Out: 1350 [Urine:1350] Intake/Output this shift: No intake/output data recorded.  No significant drainage is noted on the bandaging.  Upon removal the incision is well coapted.  Some skin slough is noted on the plantar aspect of the foot with some underlying cyanotic appearance but does not appear to be specifically necrotic at this point.  No expressible purulence or drainage from the incision or forefoot area.    Lab Results:  Recent Labs    10/22/19 0410 10/23/19 0503  WBC 10.5 8.8  HGB 7.6* 7.7*  HCT 23.5* 22.5*  PLT 320 279   BMET Recent Labs    10/22/19 0410 10/23/19 0503  NA 131* 129*  K 4.4 4.2  CL 99 95*  CO2 26 27  GLUCOSE 90 104*  BUN 9 8  CREATININE 0.70 0.74  CALCIUM 8.2* 8.5*   PT/INR Recent Labs    10/23/19 0503  LABPROT 17.5*  INR 1.5*   ABG No results for input(s): PHART, HCO3 in the last 72 hours.  Invalid input(s): PCO2, PO2  Studies/Results: DG Chest Port 1 View  Result Date: 10/21/2019 CLINICAL DATA:  Increased shortness of breath. EXAM: PORTABLE CHEST 1 VIEW COMPARISON:  Oct 17, 2019 FINDINGS: Stable pacemaker. Stable AICD device. The hila and mediastinum are unremarkable. No pneumothorax. No nodules or masses. There is a small right pleural effusion. Mild interstitial prominence centrally suggest pulmonary venous congestion. No overt edema. No  focal infiltrate. No nodule or mass. IMPRESSION: 1. Findings are most consistent with cardiomegaly, pulmonary venous congestion, and a small right pleural effusion. Electronically Signed   By: Dorise Bullion III M.D   On: 10/21/2019 13:36    Anti-infectives: Anti-infectives (From admission, onward)   Start     Dose/Rate Route Frequency Ordered Stop   10/20/19 0922  piperacillin-tazobactam (ZOSYN) 3.375 GM/50ML IVPB    Note to Pharmacy: Nyra Jabs   : cabinet override      10/20/19 0922 10/20/19 1036   10/19/19 1800  vancomycin (VANCOREADY) IVPB 750 mg/150 mL  Status:  Discontinued     750 mg 150 mL/hr over 60 Minutes Intravenous Every 8 hours 10/19/19 1702 10/22/19 1225   10/18/19 1800  vancomycin (VANCOREADY) IVPB 1250 mg/250 mL  Status:  Discontinued     1,250 mg 166.7 mL/hr over 90 Minutes Intravenous Every 12 hours 10/18/19 0355 10/19/19 1702   10/18/19 1100  piperacillin-tazobactam (ZOSYN) IVPB 3.375 g     3.375 g 12.5 mL/hr over 240 Minutes Intravenous Every 8 hours 10/18/19 0352     10/18/19 0230  vancomycin (VANCOCIN) IVPB 1000 mg/200 mL premix     1,000 mg 200 mL/hr over 60 Minutes Intravenous  Once 10/18/19 0154 10/18/19 0320   10/18/19 0200  piperacillin-tazobactam (ZOSYN) IVPB 3.375 g     3.375 g 100 mL/hr over 30 Minutes Intravenous  Once 10/18/19 0154 10/18/19 0412   10/17/19  2330  vancomycin (VANCOCIN) IVPB 1000 mg/200 mL premix     1,000 mg 200 mL/hr over 60 Minutes Intravenous  Once 10/17/19 2321 10/18/19 0057   10/17/19 2330  cefTRIAXone (ROCEPHIN) 2 g in sodium chloride 0.9 % 100 mL IVPB     2 g 200 mL/hr over 30 Minutes Intravenous  Once 10/17/19 2321 10/18/19 0057      Assessment/Plan: s/p Procedure(s): TRANSMETATARSAL AMPUTATION (Left) Assessment: Status post transmetatarsal amputation: Guarded disposition.   Plan: Betadine gauze applied to the incision and plantar aspect of the forefoot followed by a bulky gauze bandage.  Discussed with the patient  that at this point I think we can give it some time to see whether or not it will heal.  Patient may begin touchdown weightbearing on his heel only for transfers with no pressure on his forefoot area.  Recent cultures have returned with Pseudomonas and staph aureus, appears to be MSSA at this point with history of MRSA a few weeks ago.  Culture from 5/28 shows resistance to tetracycline.  Argument could be made for ID consult with a couple of weeks of IV antibiotics due to his high risk for limb loss.  If oral antibiotics are utilized would most likely recommend Cipro and Augmentin based on most recent cultures.  Patient will keep the bandage on the left foot clean, dry, and leave intact until seen outpatient next week.  Patient is stable to discharge at this point from a podiatry standpoint once he is medically stable.  If still in the hospital I will reevaluate and check his bandage in a couple of days.  LOS: 5 days    Durward Fortes 10/23/2019

## 2019-10-23 NOTE — Care Management Important Message (Signed)
Important Message  Patient Details  Name: Tyler [name] MRN: [id number] Date of Birth: 1956-01-25   Medicare Important Message Given:  Yes     Dannette Barbara 10/23/2019, 12:40 PM

## 2019-10-23 NOTE — Progress Notes (Signed)
PROGRESS NOTE    Tyler [name]  [id number] DOB: [birth year] DOA: 10/17/2019 PCP: [name], MD   Brief Narrative:  Tyler [name]  is a 64 y.o. Caucasian male with a known history of CHF, COPD, coronary disease, atrial fibrillation, hypertension and peripheral vascular disease, status post AICD, presented to emergency room with acute onset of worsening left foot swelling with erythema and discharge.  Patient had his left big toe amputated on 3/15 by Dr. Vickki Muff and later on had his second and third toes amputated on 5/14.  His erythema has been extending to his left flank.  No fever or chills, nausea or vomiting or abdominal pain.  No chest pain or palpitations.  No dysuria, oliguria or hematuria or flank pain. Started on broad-spectrum antibiotics and podiatry was consulted.  They are planning to do transmetatarsal amputation.  Subjective:  Patient generally feels okay today.  He does note that he does have increased shortness of breath from baseline.  Does find his inhalers to be quite helpful.  Discussed use of prednisone which patient does not want to have because "I want to do everything possible to help my leg heal.  I want to get back on my feet".  Assessment & Plan:   Active Problems:   Osteomyelitis of left leg (HCC)  Left lower extremity severe nonpurulent cellulitis with associated left foot osteomyelitis and gangrene with purulent cellulitis. 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 Caryl Comes today who note he is stable for discharge with outpatient follow-up. Wound cultures shows Pseudomonas resistant to Zosyn with moderate to cefepime.  Start ciprofloxacin Plan is for discharge tomorrow on Augmentin and Cipro.  COPD  Patient does have faint wheeze on examination and does note increased shortness of breath with exertion. Patient declines prednisone due to possible effects on wound healing. Will change to Pulmicort inhaler twice daily starting  now. Increase DuoNebs every 6 standing with as needed albuterol Hold Advair Diskus and Atrovent while in house, this can be restarted upon discharge likely tomorrow.  Postsurgical anemia.  Hemoglobin dropped to 7.9, is stable upon repeat.  CHF   No evidence for decompensation.  Continue present management with Entresto. X-rays without any acute abnormalities.  Hypertension Well-controlled on Entresto   Anxiety and depression. We will continue Celexa and Xanax.  Peripheral neuropathy. Continue Neurontin.  BPH. Continue Flomax.    Objective: Vitals:   10/22/19 1007 10/22/19 1543 10/23/19 0010 10/23/19 0756  BP: 108/79 (!) 103/57 121/90 137/65  Pulse: 88 74 77 71  Resp: (!) 24 (!) 22 16 17   Temp: 98.2 F (36.8 C) 98.3 F (36.8 C) (!) 97.4 F (36.3 C) 97.9 F (36.6 C)  TempSrc: Oral Oral Oral Oral  SpO2: 100% 100% 100% 92%  Weight:      Height:        Intake/Output Summary (Last 24 hours) at 10/23/2019 1441 Last data filed at 10/23/2019 1021 Gross per 24 hour  Intake 263.69 ml  Output 1350 ml  Net -1086.31 ml   Filed Weights   10/17/19 1715 10/20/19 0903  Weight: 74.8 kg 74.8 kg    Examination:  General exam: Appears calm and comfortable  Respiratory system: Clear to auscultation. Respiratory effort normal. Cardiovascular system: S1 & S2 heard, RRR. No JVD, murmurs, rubs, gallops or clicks. Gastrointestinal system: Soft, nontender, nondistended, bowel sounds positive. Central nervous system: Alert and oriented. No focal neurological deficits.Symmetric 5 x 5 power. Extremities: Right AKA, left foot with clean bulky bandage and Ace  wrap. Skin: No rashes, lesions or ulcers Psychiatry: Judgement and insight appear normal. Mood & affect appropriate.    DVT prophylaxis: SCDs Code Status: Full Family Communication: Family at bedside. Disposition Plan:  Status is: Inpatient   Remains inpatient appropriate because:Inpatient level of care appropriate due to  severity of illness   Dispo: The patient is from: Home              Anticipated d/c is to: Home              Anticipated d/c date is: 2-3 days.              Patient currently is not medically stable to d/c.  Patient wants to go back home.  TOC is looking for his options after TMA.  Might need a revision of his amputation.  Podiatry is going to reevaluate him on Monday.   Consultants:   Podiatry  Procedures:  Antimicrobials: Vancomycin Zosyn  Data Reviewed: I have personally reviewed following labs and imaging studies  CBC: Recent Labs  Lab 10/17/19 1720 10/18/19 0632 10/21/19 0443 10/22/19 0410 10/23/19 0503  WBC 10.2 10.3 10.4 10.5 8.8  NEUTROABS 7.6  --   --   --   --   HGB 10.0* 9.5* 7.9* 7.6* 7.7*  HCT 30.4* 28.8* 24.4* 23.5* 22.5*  MCV 87.6 89.2 90.4 90.0 86.5  PLT 362 375 351 320 [id number]   Basic Metabolic Panel: Recent Labs  Lab 10/18/19 0632 10/18/19 0632 10/19/19 0904 10/20/19 0418 10/21/19 0443 10/22/19 0410 10/23/19 0503  NA 129*  --   --  127* 131* 131* 129*  K 4.1  --   --  4.3 4.4 4.4 4.2  CL 91*  --   --  94* 98 99 95*  CO2 29  --   --  27 27 26 27   GLUCOSE 87  --   --  81 98 90 104*  BUN 6*  --   --  9 9 9 8   CREATININE 0.78   < > 0.74 0.75 0.72 0.70 0.74  CALCIUM 8.7*  --   --  8.3* 8.2* 8.2* 8.5*   < > = values in this interval not displayed.   GFR: Estimated Creatinine Clearance: 98.7 mL/min (by C-G formula based on SCr of 0.74 mg/dL). Liver Function Tests: Recent Labs  Lab 10/17/19 1720  AST 23  ALT 14  ALKPHOS 69  BILITOT 0.5  PROT 6.7  ALBUMIN 3.3*   No results for input(s): LIPASE, AMYLASE in the last 168 hours. No results for input(s): AMMONIA in the last 168 hours. Coagulation Profile: Recent Labs  Lab 10/17/19 1720 10/23/19 0503  INR 2.3* 1.5*   Cardiac Enzymes: No results for input(s): CKTOTAL, CKMB, CKMBINDEX, TROPONINI in the last 168 hours. BNP (last 3 results) No results for input(s): PROBNP in the last 8760  hours. HbA1C: No results for input(s): HGBA1C in the last 72 hours. CBG: No results for input(s): GLUCAP in the last 168 hours. Lipid Profile: No results for input(s): CHOL, HDL, LDLCALC, TRIG, CHOLHDL, LDLDIRECT in the last 72 hours. Thyroid Function Tests: No results for input(s): TSH, T4TOTAL, FREET4, T3FREE, THYROIDAB in the last 72 hours. Anemia Panel: Recent Labs    10/22/19 0410  FERRITIN 220  TIBC 199*  IRON 11*   Sepsis Labs: Recent Labs  Lab 10/17/19 1720 10/17/19 1920  LATICACIDVEN 0.9 1.2    Recent Results (from the past 240 hour(s))  Culture, blood (Routine x 2)  Status: None   Collection Time: 10/17/19  5:20 PM   Specimen: BLOOD  Result Value Ref Range Status   Specimen Description BLOOD RIGHT ANTECUBITAL  Final   Special Requests   Final    BOTTLES DRAWN AEROBIC AND ANAEROBIC Blood Culture adequate volume   Culture   Final    NO GROWTH 5 DAYS Performed at [facility], [address], [place], [place] [address]    Report Status 10/22/2019 FINAL  Final  Culture, blood (Routine x 2)     Status: None   Collection Time: 10/17/19  5:25 PM   Specimen: BLOOD LEFT HAND  Result Value Ref Range Status   Specimen Description BLOOD LEFT HAND  Final   Special Requests   Final    BOTTLES DRAWN AEROBIC AND ANAEROBIC Blood Culture adequate volume   Culture   Final    NO GROWTH 5 DAYS Performed at [facility], [address], [place], [place] [address]    Report Status 10/23/2019 FINAL  Final  Aerobic/Anaerobic Culture (surgical/deep wound)     Status: None   Collection Time: 10/18/19  1:05 PM   Specimen: Foot; Abscess  Result Value Ref Range Status   Specimen Description   Final    FOOT LEFT FOOT Performed at [facility], [address] [place], [place] [address]    Special Requests   Final    NONE Performed at [facility], [address] [place], [place] [address]    Gram Stain   Final    ABUNDANT WBC  PRESENT, PREDOMINANTLY PMN ABUNDANT GRAM POSITIVE COCCI ABUNDANT GRAM NEGATIVE RODS    Culture   Final    ABUNDANT PSEUDOMONAS AERUGINOSA FEW STAPHYLOCOCCUS AUREUS FEW GROUP B STREP(S.AGALACTIAE)ISOLATED TESTING AGAINST S. AGALACTIAE NOT ROUTINELY PERFORMED DUE TO PREDICTABILITY OF AMP/PEN/VAN SUSCEPTIBILITY. NO ANAEROBES ISOLATED Performed at [facility], [place] [address], [place], [place] [address]    Report Status 10/21/2019 FINAL  Final   Organism ID, Bacteria PSEUDOMONAS AERUGINOSA  Final   Organism ID, Bacteria STAPHYLOCOCCUS AUREUS  Final      Susceptibility   Pseudomonas aeruginosa - MIC*    CEFTAZIDIME 2 SENSITIVE Sensitive     CIPROFLOXACIN <=0.25 SENSITIVE Sensitive     GENTAMICIN <=1 SENSITIVE Sensitive     IMIPENEM 2 SENSITIVE Sensitive     PIP/TAZO 8 SENSITIVE Sensitive     CEFEPIME <=1 SENSITIVE Sensitive     * ABUNDANT PSEUDOMONAS AERUGINOSA   Staphylococcus aureus - MIC*    CIPROFLOXACIN <=0.5 SENSITIVE Sensitive     ERYTHROMYCIN >=8 RESISTANT Resistant     GENTAMICIN <=0.5 SENSITIVE Sensitive     OXACILLIN 0.5 SENSITIVE Sensitive     TETRACYCLINE <=1 SENSITIVE Sensitive     VANCOMYCIN 1 SENSITIVE Sensitive     TRIMETH/SULFA <=10 SENSITIVE Sensitive     CLINDAMYCIN <=0.25 SENSITIVE Sensitive     RIFAMPIN <=0.5 SENSITIVE Sensitive     Inducible Clindamycin NEGATIVE Sensitive     * FEW STAPHYLOCOCCUS AUREUS  SARS Coronavirus 2 by RT PCR (hospital order, performed in [facility] hospital lab) Nasopharyngeal Nasopharyngeal Swab     Status: None   Collection Time: 10/18/19  3:54 PM   Specimen: Nasopharyngeal Swab  Result Value Ref Range Status   SARS Coronavirus 2 NEGATIVE NEGATIVE Final    Comment: (NOTE) SARS-CoV-2 target nucleic acids are NOT DETECTED. The SARS-CoV-2 RNA is generally detectable in upper and lower respiratory specimens during the acute phase of infection. The lowest concentration  of SARS-CoV-2 viral copies this assay can detect is  250 copies / mL. A negative result does not preclude SARS-CoV-2 infection and should not be used as the sole basis for treatment or other patient management decisions.  A negative result may occur with improper specimen collection / handling, submission of specimen other than nasopharyngeal swab, presence of viral mutation(s) within the areas targeted by this assay, and inadequate number of viral copies (<250 copies / mL). A negative result must be combined with clinical observations, patient history, and epidemiological information. Fact Sheet for Patients:   StrictlyIdeas.no Fact Sheet for Healthcare Providers: BankingDealers.co.za This test is not yet approved or cleared  by the [place] FDA and has been authorized for detection and/or diagnosis of SARS-CoV-2 by FDA under an Emergency Use Authorization (EUA).  This EUA will remain in effect (meaning this test can be used) for the duration of the COVID-19 declaration under Section 564(b)(1) of the Act, 21 U.S.C. section 360bbb-3(b)(1), unless the authorization is terminated or revoked sooner. Performed at [facility], [address], [place], [place] [address]   Surgical pcr screen     Status: Abnormal   Collection Time: 10/20/19  2:18 AM   Specimen: Nasal Mucosa; Nasal Swab  Result Value Ref Range Status   MRSA, PCR NEGATIVE NEGATIVE Final   Staphylococcus aureus POSITIVE (A) NEGATIVE Final    Comment: (NOTE) The Xpert SA Assay (FDA approved for NASAL specimens in patients 14 years of age and older), is one component of a comprehensive surveillance program. It is not intended to diagnose infection nor to guide or monitor treatment. Performed at [facility], [address] [place], [place] [address]   Aerobic/Anaerobic Culture (surgical/deep wound)     Status: None   Collection Time: 10/20/19 11:39 AM   Specimen: Wound  Result Value Ref Range Status    Specimen Description   Final    WOUND Performed at [facility], [address] [place], [place] [address]    Special Requests   Final    NONE Performed at [facility], [address] [place], [place] [address]    Gram Stain   Final    NO WBC SEEN ABUNDANT GRAM POSITIVE COCCI IN PAIRS    Culture   Final    ABUNDANT PSEUDOMONAS AERUGINOSA FEW STAPHYLOCOCCUS AUREUS ABUNDANT BACTEROIDES THETAIOTAOMICRON BETA LACTAMASE POSITIVE Performed at [facility], [place] [address] [place],  [address]    Report Status 10/23/2019 FINAL  Final   Organism ID, Bacteria PSEUDOMONAS AERUGINOSA  Final   Organism ID, Bacteria STAPHYLOCOCCUS AUREUS  Final      Susceptibility   Pseudomonas aeruginosa - MIC*    CEFTAZIDIME 16 INTERMEDIATE Intermediate     CIPROFLOXACIN <=0.25 SENSITIVE Sensitive     GENTAMICIN <=1 SENSITIVE Sensitive     IMIPENEM 2 SENSITIVE Sensitive     CEFEPIME INTERMEDIATE Intermediate     * ABUNDANT PSEUDOMONAS AERUGINOSA   Staphylococcus aureus - MIC*    CIPROFLOXACIN <=0.5 SENSITIVE Sensitive     ERYTHROMYCIN <=0.25 SENSITIVE Sensitive     GENTAMICIN <=0.5 SENSITIVE Sensitive     OXACILLIN <=0.25 SENSITIVE Sensitive     TETRACYCLINE >=16 RESISTANT Resistant     VANCOMYCIN 1 SENSITIVE Sensitive     TRIMETH/SULFA <=10 SENSITIVE Sensitive     CLINDAMYCIN <=0.25 SENSITIVE Sensitive     RIFAMPIN <=0.5 SENSITIVE Sensitive     Inducible Clindamycin NEGATIVE Sensitive     * FEW STAPHYLOCOCCUS AUREUS  Radiology Studies: No results found.  Scheduled Meds: . aspirin EC  81 mg Oral QPC supper  . budesonide (PULMICORT) nebulizer solution  0.25 mg Nebulization BID  . chlorhexidine  15 mL Mouth/Throat Once  . Chlorhexidine Gluconate Cloth  6 each Topical Daily  . ciprofloxacin  500 mg Oral BID  . citalopram  10 mg Oral Daily  . docusate sodium  100 mg Oral Daily  . fluticasone  2 spray Each Nare Daily  . furosemide  20 mg Oral Daily  .  gabapentin  300 mg Oral BID  . guaiFENesin  600 mg Oral QPM  . ipratropium  2 spray Each Nare BID  . ipratropium-albuterol  3 mL Nebulization Q6H  . loratadine  10 mg Oral QHS  . metoprolol succinate  25 mg Oral Daily  . mupirocin ointment  1 application Nasal BID  . pantoprazole  40 mg Oral QAC breakfast  . pravastatin  20 mg Oral q1800  . Restore Wound Care Dressing  1 each Topical Daily  . sacubitril-valsartan  1 tablet Oral BID  . tamsulosin  0.4 mg Oral QPC supper  . vitamin B-12  1,000 mcg Oral Daily  . warfarin  4 mg Oral ONCE-1600  . Warfarin - Pharmacist Dosing Inpatient   Does not apply q1600   Continuous Infusions: . sodium chloride 250 mL (10/23/19 0800)     LOS: 5 days   Time spent: 45 minutes  Dewaine Oats Derek Jack, MD [facility]  If 7PM-7AM, please contact night-coverage [email or web address]  10/23/2019, 2:41 PM   This record has been created using Systems analyst. Errors have been sought and corrected,but may not always be located. Such creation errors do not reflect on the standard of care.

## 2019-10-23 NOTE — Anesthesia Postprocedure Evaluation (Signed)
Anesthesia Post Note  Patient: Tyler [name]  Procedure(s) Performed: TRANSMETATARSAL AMPUTATION (Left Toe)  Patient location during evaluation: PACU Anesthesia Type: General Level of consciousness: awake and alert Pain management: pain level controlled Vital Signs Assessment: post-procedure vital signs reviewed and stable Respiratory status: spontaneous breathing, nonlabored ventilation and respiratory function stable Cardiovascular status: blood pressure returned to baseline and stable Postop Assessment: no apparent nausea or vomiting Anesthetic complications: no     Last Vitals:  Vitals:   10/23/19 0010 10/23/19 0756  BP: 121/90 137/65  Pulse: 77 71  Resp: 16 17  Temp: (!) 36.3 C 36.6 C  SpO2: 100% 92%    Last Pain:  Vitals:   10/23/19 0756  TempSrc: Oral  PainSc:                  Tera Mater

## 2019-10-24 DIAGNOSIS — M869 Osteomyelitis, unspecified: Secondary | ICD-10-CM

## 2019-10-24 HISTORY — DX: Osteomyelitis, unspecified: M86.9

## 2019-10-24 LAB — PROTIME-INR
INR: 1.6 — ABNORMAL HIGH (ref 0.8–1.2)
Prothrombin Time: 18.3 seconds — ABNORMAL HIGH (ref 11.4–15.2)

## 2019-10-24 MED ORDER — CIPROFLOXACIN HCL 500 MG PO TABS: 500.0000 mg | ORAL_TABLET | Freq: Two times a day (BID) | ORAL | 0 refills | Status: AC

## 2019-10-24 MED ORDER — WARFARIN SODIUM 4 MG PO TABS
4.0000 mg | ORAL_TABLET | Freq: Once | ORAL | Status: AC
  Administered 2019-10-24: 4 mg via ORAL
  Filled 2019-10-24: qty 1

## 2019-10-24 MED ORDER — AMOXICILLIN-POT CLAVULANATE 875-125 MG PO TABS: 1.0000 | ORAL_TABLET | Freq: Two times a day (BID) | ORAL | 0 refills | Status: AC

## 2019-10-24 MED ORDER — WARFARIN SODIUM 4 MG PO TABS: 4.0000 mg | ORAL_TABLET | Freq: Once | ORAL | 0 refills | Status: DC

## 2019-10-24 NOTE — Plan of Care (Signed)
Patient discharged home with home health. All discharge instructions given and all questions Patient will make all discharge appointments.

## 2019-10-24 NOTE — Discharge Summary (Signed)
Tyler [name] [id number] DOB: April 10, 1956 DOA: 10/17/2019  PCP: [name], MD  Admit date: 10/17/2019  Discharge date: 10/24/2019  Admitted From: Home   disposition: Home refused referral to SNF as recommended.   Recommendations for Outpatient Follow-up:   Follow up with PCP in 1-2 days PCP: PLEASE CHECK INR WITHIN 2 DAYS AFTER DISCHARGE. PATIENT HAS BEEN DISCHARGED ON AUGMENTIN AND CIPRO, BOTH OF WHICH CAN AFFECT INR.   Home Health: Home health, PT and aide Equipment/Devices: Hoyer lift Consultations: Podiatry Discharge Condition: Improved CODE STATUS: Full Diet Recommendation: Heart Healthy   Diet Order            Diet - low sodium heart healthy        Diet Heart Room service appropriate? Yes; Fluid consistency: Thin  Diet effective now               Chief Complaint  Patient presents with  . Post-op Problem     Brief history of present illness from the day of admission and additional interim summary    Tyler [name] a64 y.o.Caucasian malewith a known history of CHF, COPD, extensive vasculopathy including coronary disease, atrial fibrillation, hypertension and peripheral vascular disease, status post AICD, presented to emergency room with acute onset of worsening left foot swelling with erythema and discharge. Patient had his left big toe amputated on 3/15 by Dr. Vickki Muff and later on had his second and third toes amputated on 5/14. His erythema has been extending to his left flank. No fever or chills, nausea or vomiting or abdominal pain. No chest pain or palpitations.No dysuria, oliguria or hematuria or flank pain.                                                                  Hospital Course   Patient was seen by Dr. 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  Wound culture from 5/14 has grown out MRSA and Pseudomonas.  Patient improved progressively with wound care and IV antibiotics.  He is discharged home on Augmentin and ciprofloxacin to follow-up with Dr. Caryl Comes within a week.  Patient's warfarin was monitored closely while in house especially given initiation of antibiotics.  His warfarin was decreased to 4 mg daily.  Patient is to be discharged to the care of his PCP to closely monitor INR.  This is been explained to the patient.  Left lower extremity severe nonpurulent cellulitis with associated left foot osteomyelitis and gangrene with purulent cellulitis. 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 [name] today who note he is stable for discharge with outpatient follow-up. Wound cultures shows Pseudomonas resistant to Zosyn with moderate resistance to cefepime.   Ciprofloxacin was started. Patient was discharged on Augmentin and Cipro follow-up with Dr.  [name] who will decide on duration  COPD  Continue Advair Diskus and Atrovent while in house, this can be restarted upon discharge likely tomorrow.  Postsurgical anemia.  Hemoglobin dropped to 7.9, is stable upon repeat.  CHF   No evidence for decompensation.  Continue present management with Entresto. X-rays without any acute abnormalities.  Hypertension Well-controlled on Entresto   Anxiety and depression. We will continue Celexa and Xanax.  Peripheral neuropathy. Continue Neurontin.  BPH. Continue Flomax.   Discharge diagnosis     Active Problems:   Osteomyelitis of left leg [facility])    Discharge instructions    Discharge Instructions    Call MD for:  persistant dizziness or light-headedness   Complete by: As directed    Call MD for:  redness, tenderness, or signs of infection (pain, swelling, redness, odor or green/yellow discharge around incision site)   Complete by: As directed     Call MD for:  severe uncontrolled pain   Complete by: As directed    Diet - low sodium heart healthy   Complete by: As directed    Discharge instructions   Complete by: As directed    1. Take your antibiotics called Cipro and Augmentin, as prescribed.  2. Dr [name] will see you in 1 week, he will prescribe more if you need them.  3. You need to see your PCP to have your INR (Coumadin level) checked IN 2-3 DAYS. Please make sure you make an appointment to see them ASAP.   Increase activity slowly   Complete by: As directed       Discharge Medications   Allergies as of 10/24/2019      Reactions   Apixaban Rash   Rivaroxaban Rash      Medication List    TAKE these medications   acetaminophen 500 MG tablet Commonly known as: TYLENOL Take 1,000 mg by mouth daily as needed for moderate pain or headache.   ALPRAZolam 1 MG tablet Commonly known as: XANAX Take 1 mg by mouth 2 (two) times daily as needed for anxiety or sleep.   amoxicillin-clavulanate 875-125 MG tablet Commonly known as: Augmentin Take 1 tablet by mouth every 12 (twelve) hours for 10 days.   aspirin EC 81 MG tablet Take 1 tablet (81 mg total) by mouth daily. What changed: when to take this   ciprofloxacin 500 MG tablet Commonly known as: CIPRO Take 1 tablet (500 mg total) by mouth 2 (two) times daily for 10 days.   citalopram 10 MG tablet Commonly known as: CELEXA Take 10 mg by mouth daily.   Combivent Respimat 20-100 MCG/ACT Aers respimat Generic drug: Ipratropium-Albuterol Inhale 1 puff into the lungs every 4 (four) hours.   docusate sodium 100 MG capsule Commonly known as: COLACE Take 100 mg by mouth daily as needed (for constipation.).   Entresto 24-26 MG Generic drug: sacubitril-valsartan Take 1 tablet by mouth 2 (two) times daily.   fluticasone 50 MCG/ACT nasal spray Commonly known as: FLONASE Place 2 sprays into both nostrils daily.   fluticasone-salmeterol 115-21 MCG/ACT inhaler Commonly  known as: Advair HFA USE 2 INHALATIONS ORALLY EVERY 12 HOURS What changed:   how much to take  how to take this  when to take this  additional instructions   Flutter Devi Use as directed.   furosemide 20 MG tablet Commonly known as: LASIX Take 20 mg by mouth daily.   gabapentin 100 MG capsule Commonly known as: NEURONTIN Take 300 mg by mouth in the morning,  at noon, and at bedtime.   guaiFENesin 600 MG 12 hr tablet Commonly known as: MUCINEX Take 600 mg by mouth every evening.   ipratropium 0.03 % nasal spray Commonly known as: ATROVENT Place 2 sprays into both nostrils 2 (two) times daily.   levalbuterol 1.25 MG/3ML nebulizer solution Commonly known as: XOPENEX Take 1.25 mg (3 mLs total) by nebulization every 4 (four) hours. What changed:   when to take this  reasons to take this   loratadine 10 MG tablet Commonly known as: CLARITIN Take 10 mg by mouth at bedtime.   lovastatin 20 MG tablet Commonly known as: MEVACOR Take 20 mg by mouth at bedtime.   metoprolol succinate 25 MG 24 hr tablet Commonly known as: TOPROL-XL Take 25 mg by mouth daily.   nitroGLYCERIN 0.4 MG SL tablet Commonly known as: NITROSTAT Place 0.4 mg under the tongue every 5 (five) minutes as needed for chest pain.   oxyCODONE-acetaminophen 7.5-325 MG tablet Commonly known as: PERCOCET Take 1 tablet by mouth every 6 (six) hours as needed for moderate pain.   pantoprazole 40 MG tablet Commonly known as: PROTONIX Take 40 mg by mouth daily before breakfast.   Restore Wound Care Dressing Pads Apply 1 each topically daily.   Spiriva Respimat 1.25 MCG/ACT Aers Generic drug: Tiotropium Bromide Monohydrate INHALE 2 PUFFS BY MOUTH INTO THE LUNGS DAILY What changed: See the new instructions.   tamsulosin 0.4 MG Caps capsule Commonly known as: FLOMAX Take 0.4 mg by mouth daily after supper.   vitamin B-12 1000 MCG tablet Commonly known as: CYANOCOBALAMIN Take 1,000 mcg by mouth  daily.   warfarin 4 MG tablet Commonly known as: COUMADIN Take 1 tablet (4 mg total) by mouth one time only at 4 PM for 7 days. What changed:   medication strength  how much to take  when to take this       Follow-up Information    [name], MD. Schedule an appointment as soon as possible for a visit.   Specialty: Internal Medicine Why: NEEDS INR CHECK. STARTED ON CIPRO AND AUGMENTIN ON DISCHARGE.  Contact information: [place] [address] [phone]        Sharlotte Alamo, DPM. Schedule an appointment as soon as possible for a visit in 1 week(s).   Specialty: Podiatry Contact information: [place] [address] [phone]           Major procedures and Radiology Reports - PLEASE review detailed and final reports thoroughly  -        DG Chest 2 View  Result Date: 10/17/2019 CLINICAL DATA:  Suspected sepsis EXAM: CHEST - 2 VIEW COMPARISON:  06/12/2019 FINDINGS: Left-sided pacing device as before. No focal opacity, pleural effusion, or pneumothorax. Stable mild cardiomegaly with aortic atherosclerosis. No pneumothorax. Hyperinflation with emphysematous disease IMPRESSION: No active cardiopulmonary disease. Hyperinflation with emphysematous disease Electronically Signed   By: Donavan Foil M.D.   On: 10/17/2019 18:21   PERIPHERAL VASCULAR CATHETERIZATION  Result Date: 10/05/2019 See op note  US Venous Img Lower Unilateral Left  Addendum Date: 10/18/2019   ADDENDUM REPORT: 10/18/2019 01:53 ADDENDUM: These results were called by telephone at the time of interpretation on 10/18/2019 at 1:53 am to provider [facility] , who verbally acknowledged these results. Electronically Signed   By: Lovena Le M.D.   On: 10/18/2019 01:53   Result Date: 10/18/2019 CLINICAL DATA:  Pain and swelling the left leg, history of CHF EXAM: LEFT  LOWER EXTREMITY VENOUS DOPPLER ULTRASOUND TECHNIQUE: Gray-scale sonography with compression,  as well as color and duplex ultrasound, were performed to evaluate the deep venous system(s) from the level of the common femoral vein through the popliteal and proximal calf veins. COMPARISON:  None. FINDINGS: VENOUS There is hypoechoic incompletely compressible, nonocclusive, peripherally marginated filling defects likely reflecting thrombus in the left common femoral, femoral and popliteal vein as well as the posterior tibial and peroneal veins of the calf. Normal direction of color venous flow. Normal preservation of the respiratory phasicity. Limited views of the contralateral common femoral vein are unremarkable. OTHER Likely surgical absence of the left greater saphenous vein. Limitations: none IMPRESSION: Incompletely compressible, nonocclusive thrombus seen throughout the deep venous system of the left lower extremity. A peripherally marginated hypoechoic appearance may suggest chronicity though acute thrombus is not excluded. Surgical absence of left greater saphenous vein. Currently attempting to contact the ordering provider with a critical value result. Addendum will be submitted upon case discussion. Chest Electronically Signed: By: Lovena Le M.D. On: 10/18/2019 01:46   DG Chest Port 1 View  Result Date: 10/21/2019 CLINICAL DATA:  Increased shortness of breath. EXAM: PORTABLE CHEST 1 VIEW COMPARISON:  Oct 17, 2019 FINDINGS: Stable pacemaker. Stable AICD device. The hila and mediastinum are unremarkable. No pneumothorax. No nodules or masses. There is a small right pleural effusion. Mild interstitial prominence centrally suggest pulmonary venous congestion. No overt edema. No focal infiltrate. No nodule or mass. IMPRESSION: 1. Findings are most consistent with cardiomegaly, pulmonary venous congestion, and a small right pleural effusion. Electronically Signed   By: Dorise Bullion III M.D   On: 10/21/2019 13:36   DG Foot Complete Left  Result Date: 10/17/2019 CLINICAL DATA:  Left foot pain.  Technologist notes state gangrenous foot. Concern for osteomyelitis. Two toes amputated 10 days ago. EXAM: LEFT FOOT - COMPLETE 3+ VIEW COMPARISON:  None. FINDINGS: Resection of the first, second, and third digit. The first, second, and third metatarsal heads are eroded. There is adjacent soft tissue edema and mottled gas in the soft tissues. No evidence of osteomyelitis of the in situ fourth and fifth digit. There is generalized soft tissue edema. Advanced vascular calcifications. IMPRESSION: 1. 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 2. Soft tissue edema with mottled gas in the soft tissues suspicious for soft tissue infection. 3. Generalized soft tissue edema. Advanced vascular calcifications. Electronically Signed   By: Keith Rake M.D.   On: 10/17/2019 23:42    Micro Results    Recent Results (from the past 240 hour(s))  Culture, blood (Routine x 2)     Status: None   Collection Time: 10/17/19  5:20 PM   Specimen: BLOOD  Result Value Ref Range Status   Specimen Description BLOOD RIGHT ANTECUBITAL  Final   Special Requests   Final    BOTTLES DRAWN AEROBIC AND ANAEROBIC Blood Culture adequate volume   Culture   Final    NO GROWTH 5 DAYS Performed at [facility], [address], [place], [place] [address]    Report Status 10/22/2019 FINAL  Final  Culture, blood (Routine x 2)     Status: None   Collection Time: 10/17/19  5:25 PM   Specimen: BLOOD LEFT HAND  Result Value Ref Range Status   Specimen Description BLOOD LEFT HAND  Final   Special Requests   Final    BOTTLES DRAWN AEROBIC AND ANAEROBIC Blood Culture adequate volume   Culture  Final    NO GROWTH 5 DAYS Performed at [facility], [address] [place], [place] [address]    Report Status 10/23/2019 FINAL  Final  Aerobic/Anaerobic Culture (surgical/deep wound)     Status: None   Collection Time: 10/18/19  1:05 PM   Specimen: Foot; Abscess  Result  Value Ref Range Status   Specimen Description   Final    FOOT LEFT FOOT Performed at [facility], [address] [place], [place] [address]    Special Requests   Final    NONE Performed at [facility], [address] [place], [place] [address]    Gram Stain   Final    ABUNDANT WBC PRESENT, PREDOMINANTLY PMN ABUNDANT GRAM POSITIVE COCCI ABUNDANT GRAM NEGATIVE RODS    Culture   Final    ABUNDANT PSEUDOMONAS AERUGINOSA FEW STAPHYLOCOCCUS AUREUS FEW GROUP B STREP(S.AGALACTIAE)ISOLATED TESTING AGAINST S. AGALACTIAE NOT ROUTINELY PERFORMED DUE TO PREDICTABILITY OF AMP/PEN/VAN SUSCEPTIBILITY. NO ANAEROBES ISOLATED Performed at [facility], [place] [address], [place], [place] [address]    Report Status 10/21/2019 FINAL  Final   Organism ID, Bacteria PSEUDOMONAS AERUGINOSA  Final   Organism ID, Bacteria STAPHYLOCOCCUS AUREUS  Final      Susceptibility   Pseudomonas aeruginosa - MIC*    CEFTAZIDIME 2 SENSITIVE Sensitive     CIPROFLOXACIN <=0.25 SENSITIVE Sensitive     GENTAMICIN <=1 SENSITIVE Sensitive     IMIPENEM 2 SENSITIVE Sensitive     PIP/TAZO 8 SENSITIVE Sensitive     CEFEPIME <=1 SENSITIVE Sensitive     * ABUNDANT PSEUDOMONAS AERUGINOSA   Staphylococcus aureus - MIC*    CIPROFLOXACIN <=0.5 SENSITIVE Sensitive     ERYTHROMYCIN >=8 RESISTANT Resistant     GENTAMICIN <=0.5 SENSITIVE Sensitive     OXACILLIN 0.5 SENSITIVE Sensitive     TETRACYCLINE <=1 SENSITIVE Sensitive     VANCOMYCIN 1 SENSITIVE Sensitive     TRIMETH/SULFA <=10 SENSITIVE Sensitive     CLINDAMYCIN <=0.25 SENSITIVE Sensitive     RIFAMPIN <=0.5 SENSITIVE Sensitive     Inducible Clindamycin NEGATIVE Sensitive     * FEW STAPHYLOCOCCUS AUREUS  SARS Coronavirus 2 by RT PCR (hospital order, performed in [place] hospital lab) Nasopharyngeal Nasopharyngeal Swab     Status: None   Collection Time: 10/18/19  3:54 PM   Specimen: Nasopharyngeal Swab  Result Value Ref Range Status   SARS  Coronavirus 2 NEGATIVE NEGATIVE Final    Comment: (NOTE) SARS-CoV-2 target nucleic acids are NOT DETECTED. The SARS-CoV-2 RNA is generally detectable in upper and lower respiratory specimens during the acute phase of infection. The lowest concentration of SARS-CoV-2 viral copies this assay can detect is 250 copies / mL. A negative result does not preclude SARS-CoV-2 infection and should not be used as the sole basis for treatment or other patient management decisions.  A negative result may occur with improper specimen collection / handling, submission of specimen other than nasopharyngeal swab, presence of viral mutation(s) within the areas targeted by this assay, and inadequate number of viral copies (<250 copies / mL). A negative result must be combined with clinical observations, patient history, and epidemiological information. Fact Sheet for Patients:   StrictlyIdeas.no Fact Sheet for Healthcare Providers: BankingDealers.co.za This test is not yet approved or cleared  by the [place] FDA and has been authorized for detection and/or diagnosis of SARS-CoV-2 by FDA under an Emergency Use Authorization (EUA).  This EUA will remain in effect (meaning this test  can be used) for the duration of the COVID-19 declaration under Section 564(b)(1) of the Act, 21 U.S.C. section 360bbb-3(b)(1), unless the authorization is terminated or revoked sooner. Performed at [facility], [address] [place], [place] [address]   Surgical pcr screen     Status: Abnormal   Collection Time: 10/20/19  2:18 AM   Specimen: Nasal Mucosa; Nasal Swab  Result Value Ref Range Status   MRSA, PCR NEGATIVE NEGATIVE Final   Staphylococcus aureus POSITIVE (A) NEGATIVE Final    Comment: (NOTE) The Xpert SA Assay (FDA approved for NASAL specimens in patients 38 years of age and older), is one component of a comprehensive surveillance program. It is not  intended to diagnose infection nor to guide or monitor treatment. Performed at [facility], [address], [place], [place] [address]   Aerobic/Anaerobic Culture (surgical/deep wound)     Status: None   Collection Time: 10/20/19 11:39 AM   Specimen: Wound  Result Value Ref Range Status   Specimen Description   Final    WOUND Performed at [facility], [address] [place], [place] [address]    Special Requests   Final    NONE Performed at [facility], [address] [place], [place] [address]    Gram Stain   Final    NO WBC SEEN ABUNDANT GRAM POSITIVE COCCI IN PAIRS    Culture   Final    ABUNDANT PSEUDOMONAS AERUGINOSA FEW STAPHYLOCOCCUS AUREUS ABUNDANT BACTEROIDES THETAIOTAOMICRON BETA LACTAMASE POSITIVE Performed at [facility], [place] [address], [place], [place] [address]    Report Status 10/23/2019 FINAL  Final   Organism ID, Bacteria PSEUDOMONAS AERUGINOSA  Final   Organism ID, Bacteria STAPHYLOCOCCUS AUREUS  Final      Susceptibility   Pseudomonas aeruginosa - MIC*    CEFTAZIDIME 16 INTERMEDIATE Intermediate     CIPROFLOXACIN <=0.25 SENSITIVE Sensitive     GENTAMICIN <=1 SENSITIVE Sensitive     IMIPENEM 2 SENSITIVE Sensitive     CEFEPIME INTERMEDIATE Intermediate     * ABUNDANT PSEUDOMONAS AERUGINOSA   Staphylococcus aureus - MIC*    CIPROFLOXACIN <=0.5 SENSITIVE Sensitive     ERYTHROMYCIN <=0.25 SENSITIVE Sensitive     GENTAMICIN <=0.5 SENSITIVE Sensitive     OXACILLIN <=0.25 SENSITIVE Sensitive     TETRACYCLINE >=16 RESISTANT Resistant     VANCOMYCIN 1 SENSITIVE Sensitive     TRIMETH/SULFA <=10 SENSITIVE Sensitive     CLINDAMYCIN <=0.25 SENSITIVE Sensitive     RIFAMPIN <=0.5 SENSITIVE Sensitive     Inducible Clindamycin NEGATIVE Sensitive     * FEW STAPHYLOCOCCUS AUREUS    Today   Subjective    Rayanthony Moussa feels much improved since admission.  Feels ready to go home.  Denies chest pain, shortness of breath  or abdominal pain.  Feels they can take care of themselves with the resources they have at home.  Objective   Blood pressure 110/72, pulse 71, temperature 97.6 F (36.4 C), temperature source Oral, resp. rate 16, height 6\' 1"  (1.854 m), weight 74.8 kg, SpO2 100 %.   Intake/Output Summary (Last 24 hours) at 10/24/2019 1819 Last data filed at 10/24/2019 1014 Gross per 24 hour  Intake 120 ml  Output 1950 ml  Net -1830 ml    Exam General: Patient appears well and in good spirits sitting up in bed in no acute distress.  Eyes: sclera anicteric, conjuctiva mild injection bilaterally CVS: S1-S2, regular  Respiratory:  decreased air  entry bilaterally secondary to decreased inspiratory effort, rales at bases  GI: NABS, soft, NT  LE: No edema.  Neuro: A/O x 3, Moving all extremities equally with normal strength, CN 3-12 intact, grossly nonfocal.  Psych: patient is logical and coherent, judgement and insight appear normal, mood and affect appropriate to situation.    Data Review   CBC w Diff:  Lab Results  Component Value Date   WBC 8.8 10/23/2019   HGB 7.7 (L) 10/23/2019   HGB 13.2 01/12/2014   HCT 22.5 (L) 10/23/2019   HCT 39.1 (L) 01/12/2014   PLT 279 10/23/2019   PLT 239 01/12/2014   LYMPHOPCT 10 10/17/2019   LYMPHOPCT 16.8 01/12/2014   MONOPCT 12 10/17/2019   MONOPCT 7.9 01/12/2014   EOSPCT 2 10/17/2019   EOSPCT 2.2 01/12/2014   BASOPCT 0 10/17/2019   BASOPCT 1.0 01/12/2014    CMP:  Lab Results  Component Value Date   NA 129 (L) 10/23/2019   NA 134 (L) 01/12/2014   K 4.2 10/23/2019   K 4.0 01/12/2014   CL 95 (L) 10/23/2019   CL 96 (L) 01/12/2014   CO2 27 10/23/2019   CO2 31 01/12/2014   BUN 8 10/23/2019   BUN 7 01/12/2014   CREATININE 0.74 10/23/2019   CREATININE 0.92 01/12/2014   PROT 6.7 10/17/2019   PROT 7.3 01/12/2014   ALBUMIN 3.3 (L) 10/17/2019   ALBUMIN 4.2 01/12/2014   BILITOT 0.5 10/17/2019   BILITOT 0.5 01/12/2014   ALKPHOS 69 10/17/2019   ALKPHOS  70 01/12/2014   AST 23 10/17/2019   AST 15 01/12/2014   ALT 14 10/17/2019   ALT 22 01/12/2014  .   Total Time in preparing paper work, data evaluation and todays exam - 35 minutes  Vashti Hey M.D on 10/24/2019 at 6:19 PM  [facility]   Office  [phone]

## 2019-10-24 NOTE — TOC Transition Note (Signed)
Transition of Care [facility]) - CM/SW Discharge Note   Patient Details  Name: Tyler [name] MRN: [id number] Date of Birth: Nov 02, 1955  Transition of [facility]) CM/SW Contact:  Su Hilt, RN Phone Number: 10/24/2019, 2:13 PM   Clinical Narrative:    Patient to DC home [facility] today, He has DME at Home and this admission had a [place] to the Home, He will transport with his wife, he stated that he has a wheelchair ramp and a wheelchair and been doing this for a couple of years and he can do it, I asked if he wanted EMS and he stated that no he did not., No additional needs   Final next level of care: Home w Home Health Services Barriers to Discharge: Barriers Resolved   Patient Goals and CMS Choice Patient states their goals for this hospitalization and ongoing recovery are:: wants to go home with his wife      Discharge Placement                       Discharge Plan and Services   Discharge Planning Services: CM Consult            DME Arranged: Other see comment(Hoyer Lift) DME Agency: [employer] Date DME Agency Contacted: 10/20/19 Time DME Agency Contacted: [phone] Representative spoke with at DME Agency: zack HH Arranged: PT, RN, OT, Nurse's Aide [place] Agency: [facility] ([facility]) Date [place]: 10/20/19 Time [place]: 1635 Representative spoke with at [place]: [place] ([place]) Interventions     Readmission Risk Interventions No flowsheet data found.

## 2019-10-24 NOTE — Progress Notes (Addendum)
[place] for Warfarin Indication: atrial fibrillation  Allergies  Allergen Reactions  . Apixaban Rash  . Rivaroxaban Rash    Patient Measurements: Height: 6\' 1"  (185.4 cm) Weight: 74.8 kg (164 lb 14.5 oz) IBW/kg (Calculated) : 79.9  Vital Signs: Temp: 98.4 F (36.9 C) (06/01 0428) Temp Source: Oral (05/31 2031) BP: 90/69 (06/01 0428) Pulse Rate: 76 (06/01 0428)  Labs: Recent Labs    10/22/19 0410 10/23/19 0503 10/24/19 0429  HGB 7.6* 7.7*  --   HCT 23.5* 22.5*  --   PLT 320 279  --   LABPROT  --  17.5* 18.3*  INR  --  1.5* 1.6*  CREATININE 0.70 0.74  --     Estimated Creatinine Clearance: 98.7 mL/min (by C-G formula based on SCr of 0.74 mg/dL).   Medical History: Past Medical History:  Diagnosis Date  . AICD (automatic cardioverter/defibrillator) present   . Anxiety   . Arthritis   . Atherosclerosis of artery of extremity with ulceration ([place]) 09/2019   left foot s/p toe amp requiring debridement and futher toe amputations.  . Atrial fibrillation ([place])   . Cervical spinal stenosis    with neuropathy  . CHF (congestive heart failure) ([place])   . Constipation   . COPD (chronic obstructive pulmonary disease) ([place])   . Coronary artery disease   . Depression   . Dyspnea   . Dysrhythmia    atrial fibrillation  . Emphysema of lung ([place])   . GERD (gastroesophageal reflux disease)   . Hypertension   . Lung nodule seen on imaging study    being followed by dr. [name]. just watching it for last few years, without change  . Myocardial infarction [facility]) 2004   stent placed, pacemaker implanted 2005  . Oxygen dependent    requires 2L nasal prong oxygen 24 hours a day  . Peripheral vascular disease ([place])   . Presence of permanent cardiac pacemaker [phone]    Medications:  Scheduled:  . aspirin EC  81 mg Oral QPC supper  . budesonide (PULMICORT) nebulizer solution  0.25 mg Nebulization BID  . chlorhexidine  15 mL  Mouth/Throat Once  . Chlorhexidine Gluconate Cloth  6 each Topical Daily  . ciprofloxacin  500 mg Oral BID  . citalopram  10 mg Oral Daily  . docusate sodium  100 mg Oral Daily  . fluticasone  2 spray Each Nare Daily  . furosemide  20 mg Oral Daily  . gabapentin  300 mg Oral BID  . guaiFENesin  600 mg Oral QPM  . ipratropium  2 spray Each Nare BID  . ipratropium-albuterol  3 mL Nebulization Q6H  . loratadine  10 mg Oral QHS  . metoprolol succinate  25 mg Oral Daily  . mupirocin ointment  1 application Nasal BID  . pantoprazole  40 mg Oral QAC breakfast  . pravastatin  20 mg Oral q1800  . Restore Wound Care Dressing  1 each Topical Daily  . sacubitril-valsartan  1 tablet Oral BID  . tamsulosin  0.4 mg Oral QPC supper  . vitamin B-12  1,000 mcg Oral Daily  . Warfarin - Pharmacist Dosing Inpatient   Does not apply q1600   Infusions:  . sodium chloride 250 mL (10/23/19 0800)    Assessment: 64 yo male on Warfarin for Atrial Fibrillation. Warfarin had been stopped for surgery, now resuming. No doses have been given since 5/25.   Home dose: Warfarin 5 mg daily  DATE INR  DOSE 5/25 2.3  ------ 5/30 ---- 5mg  5/31 1.5 4mg   6/1 1.6  Goal of Therapy:  INR 2-3 Monitor platelets by anticoagulation protocol: Yes   Plan:  Will continue reduced dose of  Warfarin 4 mg tonight.  Patient is receiving Abx, which can increase INR. Cipro started 5/31   F/u INR in am  CBC at least every 3 days per protocol. INRs daily while on Abx.   Lu Duffel, PharmD, BCPS Clinical Pharmacist 10/24/2019 8:15 AM

## 2019-10-25 ENCOUNTER — Inpatient Hospital Stay
Admit: 2019-10-25 | Discharge: 2019-10-25 | Disposition: A | Discharge status: Home / Self Care | Payer: Medicare HMO | Attending: Internal Medicine | Admitting: Internal Medicine

## 2019-10-25 ENCOUNTER — Other Ambulatory Visit: Payer: Self-pay

## 2019-10-25 ENCOUNTER — Emergency Department: Payer: Medicare HMO

## 2019-10-25 ENCOUNTER — Inpatient Hospital Stay
Admission: EM | Admit: 2019-10-25 | Discharge: 2019-10-29 | DRG: 291 | Disposition: A | Discharge status: Home Health | Payer: Medicare HMO | Attending: Internal Medicine | Admitting: Internal Medicine

## 2019-10-25 DIAGNOSIS — Z79899 Other long term (current) drug therapy: Secondary | ICD-10-CM | POA: Diagnosis not present

## 2019-10-25 DIAGNOSIS — I110 Hypertensive heart disease with heart failure: Secondary | ICD-10-CM | POA: Diagnosis not present

## 2019-10-25 DIAGNOSIS — Z9981 Dependence on supplemental oxygen: Secondary | ICD-10-CM | POA: Diagnosis not present

## 2019-10-25 DIAGNOSIS — R600 Localized edema: Secondary | ICD-10-CM | POA: Diagnosis not present

## 2019-10-25 DIAGNOSIS — F418 Other specified anxiety disorders: Secondary | ICD-10-CM | POA: Diagnosis present

## 2019-10-25 DIAGNOSIS — Z7982 Long term (current) use of aspirin: Secondary | ICD-10-CM | POA: Diagnosis not present

## 2019-10-25 DIAGNOSIS — M1990 Unspecified osteoarthritis, unspecified site: Secondary | ICD-10-CM | POA: Diagnosis present

## 2019-10-25 DIAGNOSIS — D6851 Activated protein C resistance: Secondary | ICD-10-CM | POA: Diagnosis not present

## 2019-10-25 DIAGNOSIS — I70203 Unspecified atherosclerosis of native arteries of extremities, bilateral legs: Secondary | ICD-10-CM | POA: Diagnosis present

## 2019-10-25 DIAGNOSIS — R069 Unspecified abnormalities of breathing: Secondary | ICD-10-CM | POA: Diagnosis not present

## 2019-10-25 DIAGNOSIS — J449 Chronic obstructive pulmonary disease, unspecified: Secondary | ICD-10-CM | POA: Diagnosis not present

## 2019-10-25 DIAGNOSIS — I480 Paroxysmal atrial fibrillation: Secondary | ICD-10-CM | POA: Diagnosis not present

## 2019-10-25 DIAGNOSIS — K219 Gastro-esophageal reflux disease without esophagitis: Secondary | ICD-10-CM | POA: Diagnosis present

## 2019-10-25 DIAGNOSIS — M869 Osteomyelitis, unspecified: Secondary | ICD-10-CM | POA: Diagnosis present

## 2019-10-25 DIAGNOSIS — Z95810 Presence of automatic (implantable) cardiac defibrillator: Secondary | ICD-10-CM

## 2019-10-25 DIAGNOSIS — I252 Old myocardial infarction: Secondary | ICD-10-CM | POA: Diagnosis not present

## 2019-10-25 DIAGNOSIS — J441 Chronic obstructive pulmonary disease with (acute) exacerbation: Secondary | ICD-10-CM | POA: Diagnosis present

## 2019-10-25 DIAGNOSIS — I5022 Chronic systolic (congestive) heart failure: Secondary | ICD-10-CM | POA: Diagnosis present

## 2019-10-25 DIAGNOSIS — I2510 Atherosclerotic heart disease of native coronary artery without angina pectoris: Secondary | ICD-10-CM | POA: Diagnosis present

## 2019-10-25 DIAGNOSIS — N400 Enlarged prostate without lower urinary tract symptoms: Secondary | ICD-10-CM | POA: Diagnosis present

## 2019-10-25 DIAGNOSIS — J9621 Acute and chronic respiratory failure with hypoxia: Secondary | ICD-10-CM | POA: Diagnosis present

## 2019-10-25 DIAGNOSIS — I509 Heart failure, unspecified: Secondary | ICD-10-CM | POA: Diagnosis not present

## 2019-10-25 DIAGNOSIS — I10 Essential (primary) hypertension: Secondary | ICD-10-CM | POA: Diagnosis present

## 2019-10-25 DIAGNOSIS — E782 Mixed hyperlipidemia: Secondary | ICD-10-CM | POA: Diagnosis present

## 2019-10-25 DIAGNOSIS — Z89422 Acquired absence of other left toe(s): Secondary | ICD-10-CM

## 2019-10-25 DIAGNOSIS — R000 Tachycardia, unspecified: Secondary | ICD-10-CM | POA: Diagnosis not present

## 2019-10-25 DIAGNOSIS — I5023 Acute on chronic systolic (congestive) heart failure: Secondary | ICD-10-CM | POA: Diagnosis not present

## 2019-10-25 DIAGNOSIS — J439 Emphysema, unspecified: Secondary | ICD-10-CM | POA: Diagnosis present

## 2019-10-25 DIAGNOSIS — Z7901 Long term (current) use of anticoagulants: Secondary | ICD-10-CM

## 2019-10-25 DIAGNOSIS — Z89611 Acquired absence of right leg above knee: Secondary | ICD-10-CM

## 2019-10-25 DIAGNOSIS — I959 Hypotension, unspecified: Secondary | ICD-10-CM | POA: Diagnosis not present

## 2019-10-25 DIAGNOSIS — Z87891 Personal history of nicotine dependence: Secondary | ICD-10-CM | POA: Diagnosis not present

## 2019-10-25 DIAGNOSIS — Z20822 Contact with and (suspected) exposure to covid-19: Secondary | ICD-10-CM | POA: Diagnosis present

## 2019-10-25 DIAGNOSIS — Z809 Family history of malignant neoplasm, unspecified: Secondary | ICD-10-CM

## 2019-10-25 DIAGNOSIS — R0602 Shortness of breath: Secondary | ICD-10-CM | POA: Diagnosis not present

## 2019-10-25 DIAGNOSIS — F419 Anxiety disorder, unspecified: Secondary | ICD-10-CM | POA: Diagnosis present

## 2019-10-25 DIAGNOSIS — R609 Edema, unspecified: Secondary | ICD-10-CM

## 2019-10-25 DIAGNOSIS — I5031 Acute diastolic (congestive) heart failure: Secondary | ICD-10-CM | POA: Diagnosis not present

## 2019-10-25 DIAGNOSIS — Z888 Allergy status to other drugs, medicaments and biological substances status: Secondary | ICD-10-CM

## 2019-10-25 DIAGNOSIS — Z8249 Family history of ischemic heart disease and other diseases of the circulatory system: Secondary | ICD-10-CM

## 2019-10-25 LAB — BLOOD GAS, VENOUS
Acid-Base Excess: 5.4 mmol/L — ABNORMAL HIGH (ref 0.0–2.0)
Bicarbonate: 31 mmol/L — ABNORMAL HIGH (ref 20.0–28.0)
O2 Saturation: 72.1 %
Patient temperature: 37
pCO2, Ven: 50 mmHg (ref 44.0–60.0)
pH, Ven: 7.4 (ref 7.250–7.430)
pO2, Ven: 38 mmHg (ref 32.0–45.0)

## 2019-10-25 LAB — CBC
HCT: 25.9 % — ABNORMAL LOW (ref 39.0–52.0)
Hemoglobin: 8.3 g/dL — ABNORMAL LOW (ref 13.0–17.0)
MCH: 28.5 pg (ref 26.0–34.0)
MCHC: 32 g/dL (ref 30.0–36.0)
MCV: 89 fL (ref 80.0–100.0)
Platelets: 404 10*3/uL — ABNORMAL HIGH (ref 150–400)
RBC: 2.91 MIL/uL — ABNORMAL LOW (ref 4.22–5.81)
RDW: 13.5 % (ref 11.5–15.5)
WBC: 9.7 10*3/uL (ref 4.0–10.5)
nRBC: 0 % (ref 0.0–0.2)

## 2019-10-25 LAB — EXPECTORATED SPUTUM ASSESSMENT W GRAM STAIN, RFLX TO RESP C

## 2019-10-25 LAB — COMPREHENSIVE METABOLIC PANEL
ALT: 19 U/L (ref 0–44)
AST: 28 U/L (ref 15–41)
Albumin: 3 g/dL — ABNORMAL LOW (ref 3.5–5.0)
Alkaline Phosphatase: 57 U/L (ref 38–126)
Anion gap: 12 (ref 5–15)
BUN: 5 mg/dL — ABNORMAL LOW (ref 8–23)
CO2: 27 mmol/L (ref 22–32)
Calcium: 8.8 mg/dL — ABNORMAL LOW (ref 8.9–10.3)
Chloride: 94 mmol/L — ABNORMAL LOW (ref 98–111)
Creatinine, Ser: 0.75 mg/dL (ref 0.61–1.24)
GFR calc Af Amer: >60 mL/min (ref >60)
GFR calc non Af Amer: >60 mL/min (ref >60)
Glucose, Bld: 117 mg/dL — ABNORMAL HIGH (ref 70–99)
Potassium: 4.8 mmol/L (ref 3.5–5.1)
Sodium: 133 mmol/L — ABNORMAL LOW (ref 135–145)
Total Bilirubin: 0.6 mg/dL (ref 0.3–1.2)
Total Protein: 6.3 g/dL — ABNORMAL LOW (ref 6.5–8.1)

## 2019-10-25 LAB — PROTIME-INR
INR: 1.8 — ABNORMAL HIGH (ref 0.8–1.2)
Prothrombin Time: 20.1 seconds — ABNORMAL HIGH (ref 11.4–15.2)

## 2019-10-25 LAB — SURGICAL PATHOLOGY

## 2019-10-25 LAB — SARS CORONAVIRUS 2 BY RT PCR (HOSPITAL ORDER, PERFORMED IN ~~LOC~~ HOSPITAL LAB): SARS Coronavirus 2: NEGATIVE

## 2019-10-25 LAB — BRAIN NATRIURETIC PEPTIDE: B Natriuretic Peptide: 525.4 pg/mL — ABNORMAL HIGH (ref 0.0–100.0)

## 2019-10-25 LAB — TROPONIN I (HIGH SENSITIVITY): Troponin I (High Sensitivity): 15 ng/L (ref <18)

## 2019-10-25 MED ORDER — IPRATROPIUM-ALBUTEROL 0.5-2.5 (3) MG/3ML IN SOLN
3.0000 mL | RESPIRATORY_TRACT | Status: DC
  Administered 2019-10-25 – 2019-10-27 (×16): 3 mL via RESPIRATORY_TRACT
  Filled 2019-10-25 (×16): qty 3

## 2019-10-25 MED ORDER — ALBUTEROL SULFATE (2.5 MG/3ML) 0.083% IN NEBU: 2.5000 mg | INHALATION_SOLUTION | RESPIRATORY_TRACT | Status: DC | PRN

## 2019-10-25 MED ORDER — CIPROFLOXACIN HCL 500 MG PO TABS
500.0000 mg | ORAL_TABLET | Freq: Two times a day (BID) | ORAL | Status: DC
  Administered 2019-10-25 – 2019-10-29 (×8): 500 mg via ORAL
  Filled 2019-10-25 (×10): qty 1

## 2019-10-25 MED ORDER — MOMETASONE FURO-FORMOTEROL FUM 200-5 MCG/ACT IN AERO: 2.0000 | INHALATION_SPRAY | Freq: Two times a day (BID) | RESPIRATORY_TRACT | Status: DC

## 2019-10-25 MED ORDER — METHYLPREDNISOLONE SODIUM SUCC 40 MG IJ SOLR
40.0000 mg | Freq: Two times a day (BID) | INTRAMUSCULAR | Status: DC
  Administered 2019-10-25 – 2019-10-26 (×2): 40 mg via INTRAVENOUS
  Filled 2019-10-25 (×2): qty 1

## 2019-10-25 MED ORDER — DM-GUAIFENESIN ER 30-600 MG PO TB12: 1.0000 | ORAL_TABLET | Freq: Two times a day (BID) | ORAL | Status: DC | PRN

## 2019-10-25 MED ORDER — METOPROLOL SUCCINATE ER 25 MG PO TB24
25.0000 mg | ORAL_TABLET | Freq: Every day | ORAL | Status: DC
  Administered 2019-10-25 – 2019-10-28 (×4): 25 mg via ORAL
  Filled 2019-10-25 (×5): qty 1

## 2019-10-25 MED ORDER — NITROGLYCERIN 0.4 MG SL SUBL: 0.4000 mg | SUBLINGUAL_TABLET | SUBLINGUAL | Status: DC | PRN

## 2019-10-25 MED ORDER — PRAVASTATIN SODIUM 20 MG PO TABS
20.0000 mg | ORAL_TABLET | Freq: Every day | ORAL | Status: DC
  Administered 2019-10-25 – 2019-10-28 (×4): 20 mg via ORAL
  Filled 2019-10-25 (×5): qty 1

## 2019-10-25 MED ORDER — FUROSEMIDE 10 MG/ML IJ SOLN
40.0000 mg | Freq: Once | INTRAMUSCULAR | Status: AC
  Administered 2019-10-25: 40 mg via INTRAVENOUS
  Filled 2019-10-25: qty 4

## 2019-10-25 MED ORDER — WARFARIN SODIUM 5 MG PO TABS
5.0000 mg | ORAL_TABLET | Freq: Once | ORAL | Status: AC
  Administered 2019-10-25: 5 mg via ORAL
  Filled 2019-10-25: qty 1

## 2019-10-25 MED ORDER — LORATADINE 10 MG PO TABS
10.0000 mg | ORAL_TABLET | Freq: Every day | ORAL | Status: DC
  Administered 2019-10-25 – 2019-10-28 (×4): 10 mg via ORAL
  Filled 2019-10-25 (×4): qty 1

## 2019-10-25 MED ORDER — TAMSULOSIN HCL 0.4 MG PO CAPS
0.4000 mg | ORAL_CAPSULE | Freq: Every day | ORAL | Status: DC
  Administered 2019-10-25 – 2019-10-28 (×4): 0.4 mg via ORAL
  Filled 2019-10-25 (×4): qty 1

## 2019-10-25 MED ORDER — PANTOPRAZOLE SODIUM 40 MG PO TBEC
40.0000 mg | DELAYED_RELEASE_TABLET | Freq: Every day | ORAL | Status: DC
  Administered 2019-10-26 – 2019-10-29 (×4): 40 mg via ORAL
  Filled 2019-10-25 (×4): qty 1

## 2019-10-25 MED ORDER — DOCUSATE SODIUM 100 MG PO CAPS
100.0000 mg | ORAL_CAPSULE | Freq: Every day | ORAL | Status: DC | PRN
  Administered 2019-10-27: 100 mg via ORAL
  Filled 2019-10-25: qty 1

## 2019-10-25 MED ORDER — HYDRALAZINE HCL 20 MG/ML IJ SOLN: 5.0000 mg | INTRAMUSCULAR | Status: DC | PRN

## 2019-10-25 MED ORDER — IPRATROPIUM-ALBUTEROL 0.5-2.5 (3) MG/3ML IN SOLN
3.0000 mL | Freq: Once | RESPIRATORY_TRACT | Status: AC
  Administered 2019-10-25: 3 mL via RESPIRATORY_TRACT
  Filled 2019-10-25: qty 3

## 2019-10-25 MED ORDER — FLUTICASONE PROPIONATE 50 MCG/ACT NA SUSP
2.0000 | Freq: Every day | NASAL | Status: DC | PRN
  Filled 2019-10-25: qty 16

## 2019-10-25 MED ORDER — ONDANSETRON HCL 4 MG/2ML IJ SOLN: 4.0000 mg | Freq: Three times a day (TID) | INTRAMUSCULAR | Status: DC | PRN

## 2019-10-25 MED ORDER — IPRATROPIUM-ALBUTEROL 0.5-2.5 (3) MG/3ML IN SOLN
3.0000 mL | Freq: Once | RESPIRATORY_TRACT | Status: AC
  Administered 2019-10-25: 3 mL via RESPIRATORY_TRACT

## 2019-10-25 MED ORDER — FLUTICASONE PROPIONATE 50 MCG/ACT NA SUSP: 2.0000 | Freq: Every day | NASAL | Status: DC

## 2019-10-25 MED ORDER — RESTORE WOUND CARE DRESSING EX PADS: 1.0000 | MEDICATED_PAD | Freq: Every day | CUTANEOUS | Status: DC

## 2019-10-25 MED ORDER — VITAMIN B-12 1000 MCG PO TABS
1000.0000 ug | ORAL_TABLET | Freq: Every day | ORAL | Status: DC
  Administered 2019-10-26 – 2019-10-29 (×4): 1000 ug via ORAL
  Filled 2019-10-25 (×4): qty 1

## 2019-10-25 MED ORDER — MOMETASONE FURO-FORMOTEROL FUM 200-5 MCG/ACT IN AERO
2.0000 | INHALATION_SPRAY | Freq: Two times a day (BID) | RESPIRATORY_TRACT | Status: DC
  Administered 2019-10-26 – 2019-10-29 (×7): 2 via RESPIRATORY_TRACT
  Filled 2019-10-25: qty 8.8

## 2019-10-25 MED ORDER — ALPRAZOLAM 0.5 MG PO TABS
1.0000 mg | ORAL_TABLET | Freq: Two times a day (BID) | ORAL | Status: DC | PRN
  Administered 2019-10-25 – 2019-10-26 (×2): 1 mg via ORAL
  Filled 2019-10-25 (×2): qty 2

## 2019-10-25 MED ORDER — WARFARIN - PHARMACIST DOSING INPATIENT
Freq: Every day | Status: DC
  Filled 2019-10-25: qty 1

## 2019-10-25 MED ORDER — GABAPENTIN 300 MG PO CAPS
300.0000 mg | ORAL_CAPSULE | Freq: Three times a day (TID) | ORAL | Status: DC
  Administered 2019-10-25 – 2019-10-29 (×12): 300 mg via ORAL
  Filled 2019-10-25 (×12): qty 1

## 2019-10-25 MED ORDER — SODIUM CHLORIDE 0.9% FLUSH
3.0000 mL | Freq: Two times a day (BID) | INTRAVENOUS | Status: DC
  Administered 2019-10-25 – 2019-10-29 (×8): 3 mL via INTRAVENOUS

## 2019-10-25 MED ORDER — METHYLPREDNISOLONE SODIUM SUCC 125 MG IJ SOLR
125.0000 mg | Freq: Once | INTRAMUSCULAR | Status: AC
  Administered 2019-10-25: 125 mg via INTRAVENOUS
  Filled 2019-10-25: qty 2

## 2019-10-25 MED ORDER — FUROSEMIDE 10 MG/ML IJ SOLN
40.0000 mg | Freq: Two times a day (BID) | INTRAMUSCULAR | Status: DC
  Administered 2019-10-25 – 2019-10-29 (×7): 40 mg via INTRAVENOUS
  Filled 2019-10-25 (×7): qty 4

## 2019-10-25 MED ORDER — ACETAMINOPHEN 325 MG PO TABS: 650.0000 mg | ORAL_TABLET | Freq: Four times a day (QID) | ORAL | Status: DC | PRN

## 2019-10-25 MED ORDER — CITALOPRAM HYDROBROMIDE 20 MG PO TABS
10.0000 mg | ORAL_TABLET | Freq: Every day | ORAL | Status: DC
  Administered 2019-10-25 – 2019-10-29 (×5): 10 mg via ORAL
  Filled 2019-10-25 (×5): qty 1

## 2019-10-25 MED ORDER — SACUBITRIL-VALSARTAN 24-26 MG PO TABS
1.0000 | ORAL_TABLET | Freq: Two times a day (BID) | ORAL | Status: DC
  Administered 2019-10-26 – 2019-10-29 (×7): 1 via ORAL
  Filled 2019-10-25 (×9): qty 1

## 2019-10-25 MED ORDER — ASPIRIN EC 81 MG PO TBEC
81.0000 mg | DELAYED_RELEASE_TABLET | Freq: Every day | ORAL | Status: DC
  Administered 2019-10-26 – 2019-10-29 (×4): 81 mg via ORAL
  Filled 2019-10-25 (×4): qty 1

## 2019-10-25 MED ORDER — SODIUM CHLORIDE 0.9 % IV SOLN: 250.0000 mL | INTRAVENOUS | Status: DC | PRN

## 2019-10-25 MED ORDER — SODIUM CHLORIDE 0.9% FLUSH: 3.0000 mL | INTRAVENOUS | Status: DC | PRN

## 2019-10-25 MED ORDER — OXYCODONE-ACETAMINOPHEN 7.5-325 MG PO TABS
1.0000 | ORAL_TABLET | Freq: Four times a day (QID) | ORAL | Status: DC | PRN
  Administered 2019-10-25 – 2019-10-29 (×14): 1 via ORAL
  Filled 2019-10-25 (×14): qty 1

## 2019-10-25 NOTE — ED Triage Notes (Signed)
Pt from home via [facility] with complaint of SOB. Pt discharged yesterday with left forefoot amputation. Pt SOB upon waking. Pt has pacemaker. Pt 97% on 10L NRB per EMS, pt 3L chronically. Pt calm but mildly labored respirations

## 2019-10-25 NOTE — Progress Notes (Signed)
Brief Cross Cover Note Patient with reports of severe shortness of breath with BIPAP mask off. Review of presenting symptoms PMH and current med history reviewed.   Patient denies use of bipap or cpap at home.  Did report need for bipap 3 years ago while hospitalized for . Review of past history from PCP in EMR reveals patient with Factor V leiden mutation  Causing both bleeding and clotting disorder. Previously on coumadin for this, atrial fib d s/p AICD placement.  Suntherapeutic INR (held for surgery Patient is s/p right transmetatarsal amputation, now with great mobility challenges.  Exam of left leg and right residual limb. Mild edema persists post surgery and pain,medial posterior calf with foot dorsiflexion. D dimer mildly elevated. Venous doppler ultrasound negative for DVT Small threshold for obtaining CT angio rule out PE if respiratory status declines given surgical history, acute on chronic hypoxia, and subtherapeutic INR with warfarin therapy

## 2019-10-25 NOTE — ED Notes (Signed)
Pt's wife came out in the hall reporting to this RN that she needed a bandaid because she "cut patient's hand with her ring". Pt with small laceration to the left hand, bleeding controlled. Gauze and tape placed on wound by this RN

## 2019-10-25 NOTE — ED Notes (Signed)
Placed on Bipap.  12/6  40%.  Tolerating well .  Continue to monitor.

## 2019-10-25 NOTE — ED Notes (Signed)
LG, LV, Grey, Blue, two sets blood cultures sent to lab

## 2019-10-25 NOTE — ED Notes (Signed)
D/c bipap.  Placed on 6l/ [place]

## 2019-10-25 NOTE — Progress Notes (Signed)
[facility] ED 06 [facility] Liaison RN note  This patient is followed by [facility]) for outpatient palliative care services.  Will follow for disposition.  Please call with any questions or concerns.  Thank you. Margaretmary Eddy, BSN, RN [facility] Liaison [phone]

## 2019-10-25 NOTE — ED Notes (Signed)
Pt stated that he would like to place bipap back on. States he talked to his wife on the phone and became winded. Pt placed back on bipap at this time

## 2019-10-25 NOTE — ED Provider Notes (Signed)
[facility] Emergency Department Provider Note   ____________________________________________    I have reviewed the triage vital signs and the nursing notes.   HISTORY  Chief Complaint Shortness of Breath     HPI Tyler [name] is a 64 y.o. male with a significant past medical history as noted below including CHF, COPD, peripheral arterial disease, atrial fibrillation, hypertension, amputation, just discharged from the hospital yesterday after left toe amputation.  He reports his left leg is significantly swollen because he received a lot of fluids.  He also has COPD.  When he left the hospital they bumped him up to 3 L nasal cannula at home but he reports this morning he woke up significantly short of breath.  Denies fevers or chills.  Has not take anything for this.  Past Medical History:  Diagnosis Date  . AICD (automatic cardioverter/defibrillator) present   . Anxiety   . Arthritis   . Atherosclerosis of artery of extremity with ulceration ([place]) 09/2019   left foot s/p toe amp requiring debridement and futher toe amputations.  . Atrial fibrillation ([place])   . Cervical spinal stenosis    with neuropathy  . CHF (congestive heart failure) ([place])   . Constipation   . COPD (chronic obstructive pulmonary disease) ([place])   . Coronary artery disease   . Depression   . Dyspnea   . Dysrhythmia    atrial fibrillation  . Emphysema of lung ([place])   . GERD (gastroesophageal reflux disease)   . Hypertension   . Lung nodule seen on imaging study    being followed by dr. [name]. just watching it for last few years, without change  . Myocardial infarction [facility]) 2004   stent placed, pacemaker implanted 2005  . Oxygen dependent    requires 2L nasal prong oxygen 24 hours a day  . Peripheral vascular disease ([place])   . Presence of permanent cardiac pacemaker 2005,2018    Patient Active Problem List   Diagnosis Date Noted  . BPH (benign prostatic  hyperplasia) 10/25/2019  . Depression with anxiety 10/25/2019  . Osteomyelitis of left leg ([place]) 10/18/2019  . Leg swelling 03/26/2019  . Palliative care by specialist 02/16/2019  . Atherosclerosis of native arteries of the extremities with ulceration ([place]) 01/31/2019  . Postoperative seroma of subcutaneous tissue after non-dermatologic procedure 02/04/2018  . Current mild episode of major depressive disorder without prior episode ([place]) 12/14/2017  . Atherosclerosis of artery of extremity with rest pain ([place]) 08/11/2017  . COPD (chronic obstructive pulmonary disease) ([place]) 06/22/2017  . Cervical disc disease with myelopathy 05/28/2017  . COPD with acute exacerbation ([place]) 10/13/2016  . Acute on chronic respiratory failure with hypoxia ([place]) 10/13/2016  . Acute on chronic systolic (congestive) heart failure ([place]) 10/13/2016  . AF (paroxysmal atrial fibrillation) ([place]) 10/13/2016  . Hx of AKA (above knee amputation), right ([place]) 05/15/2016  . Essential hypertension, benign 05/15/2016  . PVD (peripheral vascular disease) ([place]) 05/15/2016  . Atherosclerotic peripheral vascular disease with intermittent claudication ([place]) 08/01/2014  . Moderate tricuspid insufficiency 08/01/2014  . Mixed hyperlipidemia 07/23/2014  . AICD (automatic cardioverter/defibrillator) present 06/12/2011  . Factor V Leiden mutation ([place]) 06/12/2011  . Coronary artery disease 06/12/2011    Past Surgical History:  Procedure Laterality Date  . ABOVE KNEE LEG AMPUTATION Right    after below the knee amputation   . AMPUTATION TOE Left 08/04/2019   Procedure: AMPUTATION TOE MPJ LEFT;  Surgeon: Samara Deist, DPM;  Location: [facility] ORS;  Service: Podiatry;  Laterality: Left;  . AMPUTATION TOE Left 10/06/2019   Procedure: AMPUTATION TOE MPJ T1,T2 LEFT;  Surgeon: Samara Deist, DPM;  Location: [facility] ORS;  Service: Podiatry;  Laterality: Left;  . APPLICATION OF WOUND VAC Left 01/12/2018   Procedure: APPLICATION OF WOUND VAC;   Surgeon: Algernon Huxley, MD;  Location: [facility] ORS;  Service: Vascular;  Laterality: Left;  . BELOW KNEE LEG AMPUTATION Right   . CATARACT EXTRACTION, BILATERAL Bilateral   . ENDARTERECTOMY FEMORAL Left 08/11/2017   Procedure: ENDARTERECTOMY FEMORAL;  Surgeon: Algernon Huxley, MD;  Location: [facility] ORS;  Service: Vascular;  Laterality: Left;  . HEMATOMA EVACUATION Left 01/12/2018   Procedure: EVACUATION HEMATOMA ( DRAINING OF SEROMA);  Surgeon: Algernon Huxley, MD;  Location: [facility] ORS;  Service: Vascular;  Laterality: Left;  . IMPLANTABLE CARDIOVERTER DEFIBRILLATOR (ICD) GENERATOR CHANGE Left 02/10/2017   Procedure: ICD GENERATOR CHANGE;  Surgeon: Isaias Cowman, MD;  Location: [facility] ORS;  Service: Cardiovascular;  Laterality: Left;  . INSERT / REPLACE / REMOVE PACEMAKER  [id number]  . LOWER EXTREMITY ANGIOGRAPHY Left 06/07/2017   Procedure: LOWER EXTREMITY ANGIOGRAPHY;  Surgeon: Algernon Huxley, MD;  Location: [place] [facility];  Service: Cardiovascular;  Laterality: Left;  . LOWER EXTREMITY ANGIOGRAPHY Left 10/05/2019   Procedure: LOWER EXTREMITY ANGIOGRAPHY;  Surgeon: Algernon Huxley, MD;  Location: [place] [facility];  Service: Cardiovascular;  Laterality: Left;  . LOWER EXTREMITY INTERVENTION  06/07/2017   Procedure: LOWER EXTREMITY INTERVENTION;  Surgeon: Algernon Huxley, MD;  Location: [place] [facility];  Service: Cardiovascular;;  . PERIPHERAL VASCULAR CATHETERIZATION Left 12/09/2015   Procedure: Lower Extremity Angiography;  Surgeon: Algernon Huxley, MD;  Location: [place] [facility];  Service: Cardiovascular;  Laterality: Left;  . PERIPHERAL VASCULAR CATHETERIZATION  12/09/2015   Procedure: Lower Extremity Intervention;  Surgeon: Algernon Huxley, MD;  Location: [place] [facility];  Service: Cardiovascular;;  . TONSILLECTOMY    . TRANSMETATARSAL AMPUTATION Left 10/20/2019   Procedure: TRANSMETATARSAL AMPUTATION;  Surgeon: Sharlotte Alamo, DPM;  Location: [facility] OR[facility];  Service: Podiatry;  Laterality: Left;     Prior to Admission medications   Medication Sig Start Date End Date Taking? Authorizing Provider  acetaminophen (TYLENOL) 500 MG tablet Take 1,000 mg by mouth daily as needed for moderate pain or headache.    [provider]  ALPRAZolam Duanne Moron) 1 MG tablet Take 1 mg by mouth 2 (two) times daily as needed for anxiety or sleep.     [provider]  amoxicillin-clavulanate (AUGMENTIN) 875-125 MG tablet Take 1 tablet by mouth every 12 (twelve) hours for 10 days. 10/24/19 11/03/19  Vashti Hey, MD  aspirin EC 81 MG tablet Take 1 tablet (81 mg total) by mouth daily. Patient taking differently: Take 81 mg by mouth daily after supper.  12/09/15   Algernon Huxley, MD  ciprofloxacin (CIPRO) 500 MG tablet Take 1 tablet (500 mg total) by mouth 2 (two) times daily for 10 days. 10/24/19 11/03/19  Vashti Hey, MD  citalopram (CELEXA) 10 MG tablet Take 10 mg by mouth daily.    [provider]  docusate sodium (COLACE) 100 MG capsule Take 100 mg by mouth daily as needed (for constipation.).     [provider]  ENTRESTO 24-26 MG Take 1 tablet by mouth 2 (two) times daily. 01/05/19   [provider]  fluticasone (FLONASE) 50 MCG/ACT nasal spray Place 2 sprays into both nostrils daily.    [provider]  fluticasone-salmeterol (ADVAIR HFA) 115-21 MCG/ACT inhaler USE 2 INHALATIONS ORALLY EVERY 12 HOURS Patient taking differently: Inhale 2 puffs into the lungs every 12 (twelve) hours.  05/30/18   Flora Lipps, MD  furosemide (LASIX) 20 MG tablet Take 20 mg by mouth daily.     [provider]  gabapentin (NEURONTIN) 100 MG capsule Take 300 mg by mouth in the morning, at noon, and at bedtime. 09/20/19   [provider]  guaiFENesin (MUCINEX) 600 MG 12 hr tablet Take 600 mg by mouth every evening.     [provider]  ipratropium (ATROVENT) 0.03 % nasal spray Place 2 sprays into both nostrils 2 (two) times daily.      [provider]  Ipratropium-Albuterol (COMBIVENT RESPIMAT) 20-100 MCG/ACT AERS respimat Inhale 1 puff into the lungs every 4 (four) hours.     [provider]  levalbuterol (XOPENEX) 1.25 MG/3ML nebulizer solution Take 1.25 mg (3 mLs total) by nebulization every 4 (four) hours. Patient taking differently: Take 3 mLs by nebulization every 4 (four) hours as needed for wheezing or shortness of breath.  10/23/16   Theodoro Grist, MD  loratadine (CLARITIN) 10 MG tablet Take 10 mg by mouth at bedtime.    [provider]  lovastatin (MEVACOR) 20 MG tablet Take 20 mg by mouth at bedtime.    [provider]  metoprolol succinate (TOPROL-XL) 25 MG 24 hr tablet Take 25 mg by mouth daily.    [provider]  nitroGLYCERIN (NITROSTAT) 0.4 MG SL tablet Place 0.4 mg under the tongue every 5 (five) minutes as needed for chest pain.    [provider]  oxyCODONE-acetaminophen (PERCOCET) 7.5-325 MG tablet Take 1 tablet by mouth every 6 (six) hours as needed for moderate pain.     [provider]  pantoprazole (PROTONIX) 40 MG tablet Take 40 mg by mouth daily before breakfast.     [provider]  Respiratory Therapy Supplies (FLUTTER) DEVI Use as directed. 05/15/19   Magdalen Spatz, NP  SPIRIVA RESPIMAT 1.25 MCG/ACT AERS INHALE 2 PUFFS BY MOUTH INTO THE LUNGS DAILY Patient taking differently: Inhale 2 puffs into the lungs daily.  05/29/19   Flora Lipps, MD  tamsulosin (FLOMAX) 0.4 MG CAPS capsule Take 0.4 mg by mouth daily after supper.  03/07/18   [provider]  vitamin B-12 (CYANOCOBALAMIN) 1000 MCG tablet Take 1,000 mcg by mouth daily.    [provider]  warfarin (COUMADIN) 4 MG tablet Take 1 tablet (4 mg total) by mouth one time only at 4 PM for 7 days. 10/24/19 10/31/19  Vashti Hey, MD  Wound Dressings (RESTORE WOUND CARE DRESSING) PADS Apply 1 each topically daily.  01/25/18   [provider]      Allergies Apixaban and Rivaroxaban  Family History  Problem Relation Age of Onset  . Cancer Mother   . Cancer Father   . Heart disease Father     Social History Social History   Tobacco Use  . Smoking status: Former Smoker    Packs/day: 1.00    Years: 42.00    Pack years: 42.00    Types: Cigarettes    Quit date: 09/23/2013    Years since quitting: 6.0  . Smokeless tobacco: Never Used  Substance Use Topics  . Alcohol use: No  . Drug use: No    Review of Systems  Constitutional: No fever/chills Eyes: No visual changes.  ENT: No sore throat. Cardiovascular: Denies chest pain. Respiratory: As above  Gastrointestinal: No abdominal pain.  Genitourinary: Negative for dysuria. Musculoskeletal: Swelling to left leg as above Skin: No rash Neurological: Negative for headaches or weakness   ____________________________________________   PHYSICAL EXAM:  VITAL SIGNS: ED Triage Vitals  Enc Vitals Group     BP 10/25/19 0648 133/70     Pulse Rate 10/25/19 0648 86     Resp 10/25/19 0648 (!) 25     Temp 10/25/19 0648 98.9 F (37.2 C)     Temp Source 10/25/19 0648 Oral     SpO2 10/25/19 0647 100 %     Weight 10/25/19 0649 73.9 kg (163 lb)     Height 10/25/19 0649 1.854 m (6\' 1" )     Head Circumference --      Peak Flow --      Pain Score 10/25/19 0649 0     Pain Loc --      Pain Edu? --      Excl. in [place]? --     Constitutional: Alert and oriented.  Eyes: Conjunctivae are normal.  Head: Atraumatic. Nose: No congestion/rhinnorhea. Mouth/Throat: Mucous membranes are moist.    Cardiovascular: Normal rate, regular rhythm. Grossly normal heart sounds.  Good peripheral circulation. Respiratory: Increased respiratory effort with tachypnea, significant wheezing and expiratory noted, bibasilar mild rales Gastrointestinal: Soft and nontender. No distention.  No CVA tenderness.  Musculoskeletal: Right AKA, left leg demonstrates edema to the level of the knee, foot is  bandaged Neurologic:  Normal speech and language. No gross focal neurologic deficits are appreciated.  Skin:  Skin is warm, dry Psychiatric: Mood and affect are normal. Speech and behavior are normal.  ____________________________________________   LABS (all labs ordered are listed, but only abnormal results are displayed)  Labs Reviewed  CBC - Abnormal; Notable for the following components:      Result Value   RBC 2.91 (*)    Hemoglobin 8.3 (*)    HCT 25.9 (*)    Platelets 404 (*)    All other components within normal limits  COMPREHENSIVE METABOLIC PANEL - Abnormal; Notable for the following components:   Sodium 133 (*)    Chloride 94 (*)    Glucose, Bld 117 (*)    BUN 5 (*)    Calcium 8.8 (*)    Total Protein 6.3 (*)    Albumin 3.0 (*)    All other components within normal limits  BRAIN NATRIURETIC PEPTIDE - Abnormal; Notable for the following components:   B Natriuretic Peptide 525.4 (*)    All other components within normal limits  SARS CORONAVIRUS 2 BY RT PCR (HOSPITAL ORDER, [place] [facility])  BLOOD GAS, VENOUS  TROPONIN I (HIGH SENSITIVITY)   ____________________________________________  EKG  ED ECG REPORT I, Lavonia Drafts, the attending physician, personally viewed and interpreted this ECG.  Date: 10/25/2019  Rhythm: Paced QRS Axis: normal Intervals: Abnormal ST/T Wave abnormalities: normal Narrative Interpretation: Atrial fib  ____________________________________________  RADIOLOGY  Chest x-ray most consistent with edema, reviewed by me ____________________________________________   PROCEDURES  Procedure(s) performed: yes  .1-3 Lead EKG Interpretation Performed by: Lavonia Drafts, MD Authorized by: Lavonia Drafts, MD     Interpretation: normal     ECG rate assessment: normal     Rhythm: paced     Ectopy: none     Conduction: abnormal       Critical Care performed: yes  CRITICAL CARE Performed by: Lavonia Drafts   Total critical care time:30 minutes  Critical care time  was exclusive of separately billable procedures and treating other patients.  Critical care was necessary to treat or prevent imminent or life-threatening deterioration.  Critical care was time spent personally by me on the following activities: development of treatment plan with patient and/or surrogate as well as nursing, discussions with consultants, evaluation of patient's response to treatment, examination of patient, obtaining history from patient or surrogate, ordering and performing treatments and interventions, ordering and review of laboratory studies, ordering and review of radiographic studies, pulse oximetry and re-evaluation of patient's condition.  ____________________________________________   INITIAL IMPRESSION / ASSESSMENT AND PLAN / ED COURSE  Pertinent labs & imaging results that were available during my care of the patient were reviewed by me and considered in my medical decision making (see chart for details).  Patient presents with shortness of breath as detailed above.  Differential includes CHF exacerbation, COPD, pneumonia.  Patient denies fevers or chills or significant cough.  Wheezing on exam but also has edema to his left leg that is significant which makes me suspicious for CHF exacerbation.  Will treat with Solu-Medrol, DuoNeb while we await imaging.  He is on 6 L nasal cannula with continued significant respiratory effort, will place him on BiPAP  Chest x-ray most concerning for pulmonary edema, IV Lasix ordered.  Patient had good response to IV Lasix and put out 700 cc.  Weaned off BiPAP however patient is still requiring 5 to 6 L nasal cannula.  I discussed with the hospitalist for admission    ____________________________________________   FINAL CLINICAL IMPRESSION(S) / ED DIAGNOSES  Final diagnoses:  Acute on chronic congestive heart failure, unspecified heart failure type ([place])   Acute on chronic respiratory failure with hypoxia ([place])        Note:  This document was prepared using Dragon voice recognition software and may include unintentional dictation errors.   Lavonia Drafts, MD 10/25/19 1058

## 2019-10-25 NOTE — ED Notes (Signed)
Per lab, pt's sputum specimen must be recollected for accurate sample

## 2019-10-25 NOTE — ED Notes (Signed)
ECHO at bedside. Pt remains on bipap

## 2019-10-25 NOTE — ED Notes (Signed)
Pt placed back on bipap by respiratory for Dr. Blaine Hamper verbal order

## 2019-10-25 NOTE — H&P (Signed)
History and Physical    Tyler [name] [id number] DOB: Oct 22, 1955 DOA: 10/25/2019  Referring MD/NP/PA:   PCP: [name], MD   Patient coming from:  The patient is coming from home.  At baseline, pt is independent for most of ADL.        Chief Complaint: SOB  HPI: TRIG PARADY is a 64 y.o. male with medical history significant of sCHF with EF of 30%, hypertension, COPD on 3 L oxygen, BPH, GERD, AICD placement, CAD, PVD, atrial fibrillation on Coumadin, factor V Leyden mutation, depression with anxiety, GERD, who presents with shortness breath.  Patient was recently hospitalized from 5/25-6/1 due to left lower extremity cellulitis with associated left foot osteomyelitis. 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 Wound cultures shows Pseudomonas resistant to Zosyn with moderate resistance to cefepime. Patient was discharged on Augmentin and Cipro follow-up with Dr. [name]. Pt developed shortness breath since yesterday, which has been progressively worsening.  Patient has a dry cough, no chest pain, fever or chills.  Denies nausea vomiting, diarrhea, abdominal pain, symptoms of UTI or unilateral weakness.  He has left leg edema. Patient is on 3 L of oxygen at home, but currently he needs 6 L oxygen with saturation 99% in ED.  ED Course: pt was found to have BNP 525, troponin 15, pending COVID-19 PCR, INR 1.6, electrolytes renal function okay, temperature 98.9, blood pressure 113/57, heart rate 75, tachypnea.  Chest x-ray showed interstitial pulmonary edema.  Patient is placed on progressive bed for observation  Review of Systems:    General: no fevers, chills, has fatigue HEENT: no blurry vision, hearing changes or sore throat Respiratory: has dyspnea, coughing, wheezing CV: no chest pain, no palpitations GI: no nausea, vomiting, abdominal pain, diarrhea, constipation GU: no dysuria, burning on urination, increased urinary frequency, hematuria  Ext: has leg  edema. S/p of right AKA and s/p of  left transmetatarsal amputation Neuro: no unilateral weakness, numbness, or tingling, no vision change or hearing loss Skin: no rash, no skin tear. MSK: No muscle spasm, no deformity, no limitation of range of movement in spin Heme: No easy bruising.  Travel history: No recent long distant travel.  Allergy:  Allergies  Allergen Reactions  . Apixaban Rash  . Rivaroxaban Rash    Past Medical History:  Diagnosis Date  . AICD (automatic cardioverter/defibrillator) present   . Anxiety   . Arthritis   . Atherosclerosis of artery of extremity with ulceration ([place]) 09/2019   left foot s/p toe amp requiring debridement and futher toe amputations.  . Atrial fibrillation ([place])   . Cervical spinal stenosis    with neuropathy  . CHF (congestive heart failure) ([place])   . Constipation   . COPD (chronic obstructive pulmonary disease) ([place])   . Coronary artery disease   . Depression   . Dyspnea   . Dysrhythmia    atrial fibrillation  . Emphysema of lung ([place])   . GERD (gastroesophageal reflux disease)   . Hypertension   . Lung nodule seen on imaging study    being followed by dr. [name]. just watching it for last few years, without change  . Myocardial infarction [facility]) 2004   stent placed, pacemaker implanted 2005  . Oxygen dependent    requires 2L nasal prong oxygen 24 hours a day  . Peripheral vascular disease ([place])   . Presence of permanent cardiac pacemaker [phone]    Past Surgical History:  Procedure Laterality Date  . ABOVE KNEE LEG  AMPUTATION Right    after below the knee amputation   . AMPUTATION TOE Left 08/04/2019   Procedure: AMPUTATION TOE MPJ LEFT;  Surgeon: Samara Deist, DPM;  Location: [facility] ORS;  Service: Podiatry;  Laterality: Left;  . AMPUTATION TOE Left 10/06/2019   Procedure: AMPUTATION TOE MPJ T1,T2 LEFT;  Surgeon: Samara Deist, DPM;  Location: [facility];  Service: Podiatry;  Laterality: Left;  . APPLICATION OF WOUND VAC Left  01/12/2018   Procedure: APPLICATION OF WOUND VAC;  Surgeon: Algernon Huxley, MD;  Location: [facility] OR[facility];  Service: Vascular;  Laterality: Left;  . BELOW KNEE LEG AMPUTATION Right   . CATARACT EXTRACTION, BILATERAL Bilateral   . ENDARTERECTOMY FEMORAL Left 08/11/2017   Procedure: ENDARTERECTOMY FEMORAL;  Surgeon: Algernon Huxley, MD;  Location: [facility] OR[facility];  Service: Vascular;  Laterality: Left;  . HEMATOMA EVACUATION Left 01/12/2018   Procedure: EVACUATION HEMATOMA ( DRAINING OF SEROMA);  Surgeon: Algernon Huxley, MD;  Location: [facility];  Service: Vascular;  Laterality: Left;  . IMPLANTABLE CARDIOVERTER DEFIBRILLATOR (ICD) GENERATOR CHANGE Left 02/10/2017   Procedure: ICD GENERATOR CHANGE;  Surgeon: Isaias Cowman, MD;  Location: [facility];  Service: Cardiovascular;  Laterality: Left;  . INSERT / REPLACE / REMOVE PACEMAKER  [id number]  . LOWER EXTREMITY ANGIOGRAPHY Left 06/07/2017   Procedure: LOWER EXTREMITY ANGIOGRAPHY;  Surgeon: Algernon Huxley, MD;  Location: [place] [facility];  Service: Cardiovascular;  Laterality: Left;  . LOWER EXTREMITY ANGIOGRAPHY Left 10/05/2019   Procedure: LOWER EXTREMITY ANGIOGRAPHY;  Surgeon: Algernon Huxley, MD;  Location: [place] [facility];  Service: Cardiovascular;  Laterality: Left;  . LOWER EXTREMITY INTERVENTION  06/07/2017   Procedure: LOWER EXTREMITY INTERVENTION;  Surgeon: Algernon Huxley, MD;  Location: [place] [facility];  Service: Cardiovascular;;  . PERIPHERAL VASCULAR CATHETERIZATION Left 12/09/2015   Procedure: Lower Extremity Angiography;  Surgeon: Algernon Huxley, MD;  Location: [place] [facility];  Service: Cardiovascular;  Laterality: Left;  . PERIPHERAL VASCULAR CATHETERIZATION  12/09/2015   Procedure: Lower Extremity Intervention;  Surgeon: Algernon Huxley, MD;  Location: [place] [facility];  Service: Cardiovascular;;  . TONSILLECTOMY    . TRANSMETATARSAL AMPUTATION Left 10/20/2019   Procedure: TRANSMETATARSAL AMPUTATION;  Surgeon: Sharlotte Alamo, DPM;  Location: [facility]  ORS;  Service: Podiatry;  Laterality: Left;    Social History:  reports that he quit smoking about 6 years ago. His smoking use included cigarettes. He has a 42.00 pack-year smoking history. He has never used smokeless tobacco. He reports that he does not drink alcohol or use drugs.  Family History:  Family History  Problem Relation Age of Onset  . Cancer Mother   . Cancer Father   . Heart disease Father      Prior to Admission medications   Medication Sig Start Date End Date Taking? Authorizing Provider  acetaminophen (TYLENOL) 500 MG tablet Take 1,000 mg by mouth daily as needed for moderate pain or headache.    [provider]  ALPRAZolam Duanne Moron) 1 MG tablet Take 1 mg by mouth 2 (two) times daily as needed for anxiety or sleep.     [provider]  amoxicillin-clavulanate (AUGMENTIN) 875-125 MG tablet Take 1 tablet by mouth every 12 (twelve) hours for 10 days. 10/24/19 11/03/19  Vashti Hey, MD  aspirin EC 81 MG tablet Take 1 tablet (81 mg total) by mouth daily. Patient taking differently: Take 81 mg by mouth daily after supper.  12/09/15   Algernon Huxley, MD  ciprofloxacin (  CIPRO) 500 MG tablet Take 1 tablet (500 mg total) by mouth 2 (two) times daily for 10 days. 10/24/19 11/03/19  Vashti Hey, MD  citalopram (CELEXA) 10 MG tablet Take 10 mg by mouth daily.    [provider]  docusate sodium (COLACE) 100 MG capsule Take 100 mg by mouth daily as needed (for constipation.).     [provider]  ENTRESTO 24-26 MG Take 1 tablet by mouth 2 (two) times daily. 01/05/19   [provider]  fluticasone (FLONASE) 50 MCG/ACT nasal spray Place 2 sprays into both nostrils daily.    [provider]  fluticasone-salmeterol (ADVAIR HFA) 115-21 MCG/ACT inhaler USE 2 INHALATIONS ORALLY EVERY 12 HOURS Patient taking differently: Inhale 2 puffs into the lungs every 12 (twelve) hours.  05/30/18   Flora Lipps, MD  furosemide (LASIX) 20  MG tablet Take 20 mg by mouth daily.     [provider]  gabapentin (NEURONTIN) 100 MG capsule Take 300 mg by mouth in the morning, at noon, and at bedtime. 09/20/19   [provider]  guaiFENesin (MUCINEX) 600 MG 12 hr tablet Take 600 mg by mouth every evening.     [provider]  ipratropium (ATROVENT) 0.03 % nasal spray Place 2 sprays into both nostrils 2 (two) times daily.     [provider]  Ipratropium-Albuterol (COMBIVENT RESPIMAT) 20-100 MCG/ACT AERS respimat Inhale 1 puff into the lungs every 4 (four) hours.     [provider]  levalbuterol (XOPENEX) 1.25 MG/3ML nebulizer solution Take 1.25 mg (3 mLs total) by nebulization every 4 (four) hours. Patient taking differently: Take 3 mLs by nebulization every 4 (four) hours as needed for wheezing or shortness of breath.  10/23/16   Theodoro Grist, MD  loratadine (CLARITIN) 10 MG tablet Take 10 mg by mouth at bedtime.    [provider]  lovastatin (MEVACOR) 20 MG tablet Take 20 mg by mouth at bedtime.    [provider]  metoprolol succinate (TOPROL-XL) 25 MG 24 hr tablet Take 25 mg by mouth daily.    [provider]  nitroGLYCERIN (NITROSTAT) 0.4 MG SL tablet Place 0.4 mg under the tongue every 5 (five) minutes as needed for chest pain.    [provider]  oxyCODONE-acetaminophen (PERCOCET) 7.5-325 MG tablet Take 1 tablet by mouth every 6 (six) hours as needed for moderate pain.     [provider]  pantoprazole (PROTONIX) 40 MG tablet Take 40 mg by mouth daily before breakfast.     [provider]  Respiratory Therapy Supplies (FLUTTER) DEVI Use as directed. 05/15/19   Magdalen Spatz, NP  SPIRIVA RESPIMAT 1.25 MCG/ACT AERS INHALE 2 PUFFS BY MOUTH INTO THE LUNGS DAILY Patient taking differently: Inhale 2 puffs into the lungs daily.  05/29/19   Flora Lipps, MD  tamsulosin (FLOMAX) 0.4 MG CAPS capsule Take 0.4 mg by mouth daily after supper.   03/07/18   [provider]  vitamin B-12 (CYANOCOBALAMIN) 1000 MCG tablet Take 1,000 mcg by mouth daily.    [provider]  warfarin (COUMADIN) 4 MG tablet Take 1 tablet (4 mg total) by mouth one time only at 4 PM for 7 days. 10/24/19 10/31/19  Vashti Hey, MD  Wound Dressings (RESTORE WOUND CARE DRESSING) PADS Apply 1 each topically daily.  01/25/18   [provider]    Physical Exam: Vitals:   10/25/19 1030 10/25/19 1100 10/25/19 1130 10/25/19 1200  BP: (!) 122/54 117/60 130/63  129/80  Pulse: 83 72 80 82  Resp: 19 17 15  (!) 21  Temp:      TempSrc:      SpO2: 100% 95% 99% 100%  Weight:      Height:       General: Not in acute distress HEENT:       Eyes: PERRL, EOMI, no scleral icterus.       ENT: No discharge from the ears and nose, no pharynx injection, no tonsillar enlargement.        Neck: positive JVD, no bruit, no mass felt. Heme: No neck lymph node enlargement. Cardiac: S1/S2, RRR, No murmurs, No gallops or rubs. Respiratory: Has wheezing and severely decreased air movement bilaterally GI: Soft, nondistended, nontender, no rebound pain, no organomegaly, BS present. GU: No hematuria Ext: has 2+ left leg edema, S/p of right AKA and s/p of  left transmetatarsal amputation Musculoskeletal: No joint deformities, No joint redness or warmth, no limitation of ROM in spin. Skin: No rashes.  Neuro: Alert, oriented X3, cranial nerves II-XII grossly intact, moves all extremities normally. Psych: Patient is not psychotic, no suicidal or hemocidal ideation.  Labs on Admission: I have personally reviewed following labs and imaging studies  CBC: Recent Labs  Lab 10/21/19 0443 10/22/19 0410 10/23/19 0503 10/25/19 0646  WBC 10.4 10.5 8.8 9.7  HGB 7.9* 7.6* 7.7* 8.3*  HCT 24.4* 23.5* 22.5* 25.9*  MCV 90.4 90.0 86.5 89.0  PLT 351 320 279 [id number]*   Basic Metabolic Panel: Recent Labs  Lab 10/20/19 0418 10/21/19 0443 10/22/19 0410 10/23/19 0503  10/25/19 0646  NA 127* 131* 131* 129* 133*  K 4.3 4.4 4.4 4.2 4.8  CL 94* 98 99 95* 94*  CO2 27 27 26 27 27   GLUCOSE 81 98 90 104* 117*  BUN 9 9 9 8  5*  CREATININE 0.75 0.72 0.70 0.74 0.75  CALCIUM 8.3* 8.2* 8.2* 8.5* 8.8*   GFR: Estimated Creatinine Clearance: 97.5 mL/min (by C-G formula based on SCr of 0.75 mg/dL). Liver Function Tests: Recent Labs  Lab 10/25/19 0646  AST 28  ALT 19  ALKPHOS 57  BILITOT 0.6  PROT 6.3*  ALBUMIN 3.0*   No results for input(s): LIPASE, AMYLASE in the last 168 hours. No results for input(s): AMMONIA in the last 168 hours. Coagulation Profile: Recent Labs  Lab 10/23/19 0503 10/24/19 0429 10/25/19 1450  INR 1.5* 1.6* 1.8*   Cardiac Enzymes: No results for input(s): CKTOTAL, CKMB, CKMBINDEX, TROPONINI in the last 168 hours. BNP (last 3 results) No results for input(s): PROBNP in the last 8760 hours. HbA1C: No results for input(s): HGBA1C in the last 72 hours. CBG: No results for input(s): GLUCAP in the last 168 hours. Lipid Profile: No results for input(s): CHOL, HDL, LDLCALC, TRIG, CHOLHDL, LDLDIRECT in the last 72 hours. Thyroid Function Tests: No results for input(s): TSH, T4TOTAL, FREET4, T3FREE, THYROIDAB in the last 72 hours. Anemia Panel: No results for input(s): VITAMINB12, FOLATE, FERRITIN, TIBC, IRON, RETICCTPCT in the last 72 hours. Urine analysis:    Component Value Date/Time   COLORURINE YELLOW (A) 10/18/2019 0630   APPEARANCEUR CLEAR (A) 10/18/2019 0630   LABSPEC 1.006 10/18/2019 0630   PHURINE 6.0 10/18/2019 0630   GLUCOSEU NEGATIVE 10/18/2019 0630   HGBUR SMALL (A) 10/18/2019 0630   BILIRUBINUR NEGATIVE 10/18/2019 0630   KETONESUR NEGATIVE 10/18/2019 0630   PROTEINUR NEGATIVE 10/18/2019 0630   NITRITE NEGATIVE 10/18/2019 0630   LEUKOCYTESUR NEGATIVE 10/18/2019 0630   Sepsis Labs: @LABRCNTIP (procalcitonin:4,lacticidven:4) )  Recent Results (from the past 240 hour(s))  Culture, blood (Routine x 2)     Status:  None   Collection Time: 10/17/19  5:20 PM   Specimen: BLOOD  Result Value Ref Range Status   Specimen Description BLOOD RIGHT ANTECUBITAL  Final   Special Requests   Final    BOTTLES DRAWN AEROBIC AND ANAEROBIC Blood Culture adequate volume   Culture   Final    NO GROWTH 5 DAYS Performed at [facility], [address], [place], [place] [address]    Report Status 10/22/2019 FINAL  Final  Culture, blood (Routine x 2)     Status: None   Collection Time: 10/17/19  5:25 PM   Specimen: BLOOD LEFT HAND  Result Value Ref Range Status   Specimen Description BLOOD LEFT HAND  Final   Special Requests   Final    BOTTLES DRAWN AEROBIC AND ANAEROBIC Blood Culture adequate volume   Culture   Final    NO GROWTH 5 DAYS Performed at [facility], [address], [place], [place] [address]    Report Status 10/23/2019 FINAL  Final  Aerobic/Anaerobic Culture (surgical/deep wound)     Status: None   Collection Time: 10/18/19  1:05 PM   Specimen: Foot; Abscess  Result Value Ref Range Status   Specimen Description   Final    FOOT LEFT FOOT Performed at [facility], [address] [place], [place] [address]    Special Requests   Final    NONE Performed at [facility], [address] [place], [place] [address]    Gram Stain   Final    ABUNDANT WBC PRESENT, PREDOMINANTLY PMN ABUNDANT GRAM POSITIVE COCCI ABUNDANT GRAM NEGATIVE RODS    Culture   Final    ABUNDANT PSEUDOMONAS AERUGINOSA FEW STAPHYLOCOCCUS AUREUS FEW GROUP B STREP(S.AGALACTIAE)ISOLATED TESTING AGAINST S. AGALACTIAE NOT ROUTINELY PERFORMED DUE TO PREDICTABILITY OF AMP/PEN/VAN SUSCEPTIBILITY. NO ANAEROBES ISOLATED Performed at [facility], [place] [address], [place],  [address]    Report Status 10/21/2019 FINAL  Final   Organism ID, Bacteria PSEUDOMONAS AERUGINOSA  Final   Organism ID, Bacteria STAPHYLOCOCCUS AUREUS  Final      Susceptibility   Pseudomonas aeruginosa - MIC*      CEFTAZIDIME 2 SENSITIVE Sensitive     CIPROFLOXACIN <=0.25 SENSITIVE Sensitive     GENTAMICIN <=1 SENSITIVE Sensitive     IMIPENEM 2 SENSITIVE Sensitive     PIP/TAZO 8 SENSITIVE Sensitive     CEFEPIME <=1 SENSITIVE Sensitive     * ABUNDANT PSEUDOMONAS AERUGINOSA   Staphylococcus aureus - MIC*    CIPROFLOXACIN <=0.5 SENSITIVE Sensitive     ERYTHROMYCIN >=8 RESISTANT Resistant     GENTAMICIN <=0.5 SENSITIVE Sensitive     OXACILLIN 0.5 SENSITIVE Sensitive     TETRACYCLINE <=1 SENSITIVE Sensitive     VANCOMYCIN 1 SENSITIVE Sensitive     TRIMETH/SULFA <=10 SENSITIVE Sensitive     CLINDAMYCIN <=0.25 SENSITIVE Sensitive     RIFAMPIN <=0.5 SENSITIVE Sensitive     Inducible Clindamycin NEGATIVE Sensitive     * FEW STAPHYLOCOCCUS AUREUS  SARS Coronavirus 2 by RT PCR (hospital order, performed in [place] hospital lab) Nasopharyngeal Nasopharyngeal Swab     Status: None   Collection Time: 10/18/19  3:54 PM   Specimen: Nasopharyngeal Swab  Result Value Ref Range Status   SARS Coronavirus 2 NEGATIVE NEGATIVE Final    Comment: (NOTE) SARS-CoV-2 target nucleic acids are NOT DETECTED. The SARS-CoV-2  RNA is generally detectable in upper and lower respiratory specimens during the acute phase of infection. The lowest concentration of SARS-CoV-2 viral copies this assay can detect is 250 copies / mL. A negative result does not preclude SARS-CoV-2 infection and should not be used as the sole basis for treatment or other patient management decisions.  A negative result may occur with improper specimen collection / handling, submission of specimen other than nasopharyngeal swab, presence of viral mutation(s) within the areas targeted by this assay, and inadequate number of viral copies (<250 copies / mL). A negative result must be combined with clinical observations, patient history, and epidemiological information. Fact Sheet for Patients:   StrictlyIdeas.no Fact Sheet  for Healthcare Providers: BankingDealers.co.za This test is not yet approved or cleared  by the [place] FDA and has been authorized for detection and/or diagnosis of SARS-CoV-2 by FDA under an Emergency Use Authorization (EUA).  This EUA will remain in effect (meaning this test can be used) for the duration of the COVID-19 declaration under Section 564(b)(1) of the Act, 21 U.S.C. section 360bbb-3(b)(1), unless the authorization is terminated or revoked sooner. Performed at [facility], [address], [place], [place] [address]   Surgical pcr screen     Status: Abnormal   Collection Time: 10/20/19  2:18 AM   Specimen: Nasal Mucosa; Nasal Swab  Result Value Ref Range Status   MRSA, PCR NEGATIVE NEGATIVE Final   Staphylococcus aureus POSITIVE (A) NEGATIVE Final    Comment: (NOTE) The Xpert SA Assay (FDA approved for NASAL specimens in patients 26 years of age and older), is one component of a comprehensive surveillance program. It is not intended to diagnose infection nor to guide or monitor treatment. Performed at [facility], [address] [place], [place] [address]   Aerobic/Anaerobic Culture (surgical/deep wound)     Status: None   Collection Time: 10/20/19 11:39 AM   Specimen: Wound  Result Value Ref Range Status   Specimen Description   Final    WOUND Performed at [facility], [address] [place], [place] [address]    Special Requests   Final    NONE Performed at [facility], [address] [place], [place] [address]    Gram Stain   Final    NO WBC SEEN ABUNDANT GRAM POSITIVE COCCI IN PAIRS    Culture   Final    ABUNDANT PSEUDOMONAS AERUGINOSA FEW STAPHYLOCOCCUS AUREUS ABUNDANT BACTEROIDES THETAIOTAOMICRON BETA LACTAMASE POSITIVE Performed at [facility], [place] [address], [place],  [address]    Report Status 10/23/2019 FINAL  Final   Organism ID, Bacteria PSEUDOMONAS  AERUGINOSA  Final   Organism ID, Bacteria STAPHYLOCOCCUS AUREUS  Final      Susceptibility   Pseudomonas aeruginosa - MIC*    CEFTAZIDIME 16 INTERMEDIATE Intermediate     CIPROFLOXACIN <=0.25 SENSITIVE Sensitive     GENTAMICIN <=1 SENSITIVE Sensitive     IMIPENEM 2 SENSITIVE Sensitive     CEFEPIME INTERMEDIATE Intermediate     * ABUNDANT PSEUDOMONAS AERUGINOSA   Staphylococcus aureus - MIC*    CIPROFLOXACIN <=0.5 SENSITIVE Sensitive     ERYTHROMYCIN <=0.25 SENSITIVE Sensitive     GENTAMICIN <=0.5 SENSITIVE Sensitive     OXACILLIN <=0.25 SENSITIVE Sensitive     TETRACYCLINE >=16 RESISTANT Resistant     VANCOMYCIN 1 SENSITIVE Sensitive     TRIMETH/SULFA <=10 SENSITIVE Sensitive     CLINDAMYCIN <=0.25 SENSITIVE Sensitive     RIFAMPIN <=0.5  SENSITIVE Sensitive     Inducible Clindamycin NEGATIVE Sensitive     * FEW STAPHYLOCOCCUS AUREUS  SARS Coronavirus 2 by RT PCR (hospital order, performed in [facility] hospital lab) Nasopharyngeal Nasopharyngeal Swab     Status: None   Collection Time: 10/25/19 10:29 AM   Specimen: Nasopharyngeal Swab  Result Value Ref Range Status   SARS Coronavirus 2 NEGATIVE NEGATIVE Final    Comment: (NOTE) SARS-CoV-2 target nucleic acids are NOT DETECTED. The SARS-CoV-2 RNA is generally detectable in upper and lower respiratory specimens during the acute phase of infection. The lowest concentration of SARS-CoV-2 viral copies this assay can detect is 250 copies / mL. A negative result does not preclude SARS-CoV-2 infection and should not be used as the sole basis for treatment or other patient management decisions.  A negative result may occur with improper specimen collection / handling, submission of specimen other than nasopharyngeal swab, presence of viral mutation(s) within the areas targeted by this assay, and inadequate number of viral copies (<250 copies / mL). A negative result must be combined with clinical observations, patient history, and  epidemiological information. Fact Sheet for Patients:   StrictlyIdeas.no Fact Sheet for Healthcare Providers: BankingDealers.co.za This test is not yet approved or cleared  by the [place] FDA and has been authorized for detection and/or diagnosis of SARS-CoV-2 by FDA under an Emergency Use Authorization (EUA).  This EUA will remain in effect (meaning this test can be used) for the duration of the COVID-19 declaration under Section 564(b)(1) of the Act, 21 U.S.C. section 360bbb-3(b)(1), unless the authorization is terminated or revoked sooner. Performed at [facility], [address] [place], [place] [address]      Radiological Exams on Admission: DG Chest Portable 1 View  Result Date: 10/25/2019 CLINICAL DATA:  Shortness of breath EXAM: PORTABLE CHEST 1 VIEW COMPARISON:  10/21/2019 FINDINGS: Cardiomegaly with biventricular ICD/pacer. Mild interstitial coarsening with a few Kerley lines and trace right pleural effusion. Emphysema. No consolidation or pneumothorax. IMPRESSION: Mild interstitial opacity which could be related to patient's emphysema or early pulmonary edema. Electronically Signed   By: Monte Fantasia M.D.   On: 10/25/2019 07:50     EKG: Independently reviewed.  Atrial fibrillation, paced rhythm, QTC 423  Assessment/Plan Principal Problem:   Acute on chronic respiratory failure with hypoxia (HCC) Active Problems:   Essential hypertension, benign   COPD with acute exacerbation (HCC)   Acute on chronic systolic (congestive) heart failure (HCC)   AF (paroxysmal atrial fibrillation) (HCC)   Coronary artery disease   Osteomyelitis of left leg (HCC)   BPH (benign prostatic hyperplasia)   Depression with anxiety  Acute on chronic respiratory failure with hypoxia ([place]):  Likely due to combination of sCHF and COPD exacerbation.  Patient has severely decreased air movement and wheezing on auscultation, indicating  COPD exacerbation.  Patient has elevated BNP 525, 2+ left leg edema, positive JVD, interstitial pulmonary edema on chest x-ray, consistent with CHF exacerbation.  -Admit to progressive bed as inpatient -Bronchodilators -IV Lasix -Nasal cannula oxygen to maintain oxygen saturation above 93%  Acute on chronic systolic (congestive) heart failure ([place]): 2D echo on 12/19/2018 showed EF of 30% -Lasix 40 mg twice daily -2d echo -Daily weights -strict I/O's -Low salt diet -Fluid restriction -Obtain REDs Vest reading -continue Entresto  COPD with acute exacerbation: -Bronchodilators -Solu-Medrol 40 mg IV bid -Mucinex for cough  -Incentive spirometry  HTN:  -On IV lasix, Entresto and metoprolol -hydralazine prn  AF (paroxysmal atrial  fibrillation) ([place]) -Couamdin per pharm -metoprolol  Coronary artery disease: no CP -ASA and statin   Osteomyelitis of left leg ([place]): -continue Augmentin and Cipro   BPH (benign prostatic hyperplasia) -Flomax  Depression with anxiety: -Continue home Xanax, Celexa   GERD: -Protonix       DVT ppx: on couamdin Code Status: Full code Family Communication: Yes, patient's wife at bed side Disposition Plan:  Anticipate discharge back to previous environment Consults called:  none Admission status: progressive unit as inpt      Status is: Inpatient  Remains inpatient appropriate because:Inpatient level of care appropriate due to severity of illness.  Patient has multiple comorbidities, now presents with acute on chronic respiratory failure secondary to acute on chronic systolic CHF. Patient was just discharged from hospital yesterday. His presentation is highly complicated.  Patient is at high risk of deteriorating.  Patient will need to be treated with IV diuretics. Will need to be treated in hospital for at least 2 days.    Dispo: The patient is from: Home              Anticipated d/c is to: Home              Anticipated d/c date is: 2  days              Patient currently is not medically stable to d/c.           Date of Service 10/25/2019    Ivor [facility]   If 7PM-7AM, please contact night-coverage [email or web address] 10/25/2019, 3:14 PM

## 2019-10-25 NOTE — Consult Note (Addendum)
[place] for Warfarin Indication: atrial fibrillation  Allergies  Allergen Reactions   Apixaban Rash   Rivaroxaban Rash    Patient Measurements: Height: 6\' 1"  (185.4 cm) Weight: 73.9 kg (163 lb) IBW/kg (Calculated) : 79.9  Vital Signs: Temp: 98.9 F (37.2 C) (06/02 0648) Temp Source: Oral (06/02 0648) BP: 113/57 (06/02 1000) Pulse Rate: 75 (06/02 1000)  Labs: Recent Labs    10/23/19 0503 10/24/19 0429 10/25/19 0646  HGB 7.7*  --  8.3*  HCT 22.5*  --  25.9*  PLT 279  --  404*  LABPROT 17.5* 18.3*  --   INR 1.5* 1.6*  --   CREATININE 0.74  --  0.75  TROPONINIHS  --   --  15    Estimated Creatinine Clearance: 97.5 mL/min (by C-G formula based on SCr of 0.75 mg/dL).   Medical History: Past Medical History:  Diagnosis Date   AICD (automatic cardioverter/defibrillator) present    Anxiety    Arthritis    Atherosclerosis of artery of extremity with ulceration ([place]) 09/2019   left foot s/p toe amp requiring debridement and futher toe amputations.   Atrial fibrillation (HCC)    Cervical spinal stenosis    with neuropathy   CHF (congestive heart failure) (HCC)    Constipation    COPD (chronic obstructive pulmonary disease) (HCC)    Coronary artery disease    Depression    Dyspnea    Dysrhythmia    atrial fibrillation   Emphysema of lung (HCC)    GERD (gastroesophageal reflux disease)    Hypertension    Lung nodule seen on imaging study    being followed by dr. [name]. just watching it for last few years, without change   Myocardial infarction [facility]) 2004   stent placed, pacemaker implanted 2005   Oxygen dependent    requires 2L nasal prong oxygen 24 hours a day   Peripheral vascular disease ([place])    Presence of permanent cardiac pacemaker [phone]    Medications:  (Not in a hospital admission)  Scheduled:   furosemide  40 mg Intravenous Q12H   ipratropium-albuterol  3 mL Nebulization Q4H    methylPREDNISolone (SOLU-MEDROL) injection  40 mg Intravenous Q12H   Infusions:   PRN: acetaminophen, albuterol, dextromethorphan-guaiFENesin, hydrALAZINE, ondansetron (ZOFRAN) IV, oxyCODONE-acetaminophen Anti-infectives (From admission, onward)   None      Assessment: Pharmacy consulted to restart warfarin for afib. Patient states he takes warfarin 5 mg daily. He has not had his dose today. Patient is ciprofloxacin.   Date INR Warfarin Dose  6/1 1.6   6/2 1.8 5 mg         Goal of Therapy:  INR 2-3 Monitor platelets by anticoagulation protocol: Yes   Plan:  INR is subtherapeutic. Will give warfarin 5 mg tonight (home dose). Daily INR ordered. CBC every 3 days at least. May need to decrease dose tomorrow as patient is on abx which can increase INR.   Oswald Hillock, PharmD, BCPS 10/25/2019,1:02 PM

## 2019-10-25 NOTE — ED Notes (Signed)
Pt taken off of bipap for trial on [place], placed on 6L [place], pt advised to call out if breathing worsens. Will monitor.

## 2019-10-26 ENCOUNTER — Inpatient Hospital Stay: Payer: Medicare HMO

## 2019-10-26 ENCOUNTER — Encounter: Payer: Self-pay | Admitting: Internal Medicine

## 2019-10-26 LAB — ECHOCARDIOGRAM COMPLETE
Height: 73 in
Weight: 2608 oz

## 2019-10-26 LAB — BASIC METABOLIC PANEL
Anion gap: 12 (ref 5–15)
BUN: 8 mg/dL (ref 8–23)
CO2: 30 mmol/L (ref 22–32)
Calcium: 8.9 mg/dL (ref 8.9–10.3)
Chloride: 90 mmol/L — ABNORMAL LOW (ref 98–111)
Creatinine, Ser: 0.71 mg/dL (ref 0.61–1.24)
GFR calc Af Amer: >60 mL/min (ref >60)
GFR calc non Af Amer: >60 mL/min (ref >60)
Glucose, Bld: 131 mg/dL — ABNORMAL HIGH (ref 70–99)
Potassium: 4 mmol/L (ref 3.5–5.1)
Sodium: 132 mmol/L — ABNORMAL LOW (ref 135–145)

## 2019-10-26 LAB — PROTIME-INR
INR: 1.9 — ABNORMAL HIGH (ref 0.8–1.2)
Prothrombin Time: 20.9 seconds — ABNORMAL HIGH (ref 11.4–15.2)

## 2019-10-26 LAB — FIBRIN DERIVATIVES D-DIMER (ARMC ONLY): Fibrin derivatives D-dimer (ARMC): 565.31 ng/mL (FEU) — ABNORMAL HIGH (ref 0.00–499.00)

## 2019-10-26 LAB — MAGNESIUM: Magnesium: 1.7 mg/dL (ref 1.7–2.4)

## 2019-10-26 LAB — PROCALCITONIN: Procalcitonin: <0.1 ng/mL

## 2019-10-26 MED ORDER — WARFARIN SODIUM 5 MG PO TABS
5.0000 mg | ORAL_TABLET | Freq: Once | ORAL | Status: AC
  Administered 2019-10-26: 5 mg via ORAL
  Filled 2019-10-26: qty 1

## 2019-10-26 MED ORDER — MAGNESIUM SULFATE 2 GM/50ML IV SOLN
2.0000 g | Freq: Once | INTRAVENOUS | Status: AC
  Administered 2019-10-26: 2 g via INTRAVENOUS
  Filled 2019-10-26: qty 50

## 2019-10-26 MED ORDER — IOHEXOL 350 MG/ML SOLN
75.0000 mL | Freq: Once | INTRAVENOUS | Status: AC | PRN
  Administered 2019-10-26: 75 mL via INTRAVENOUS

## 2019-10-26 MED ORDER — PREDNISONE 20 MG PO TABS
40.0000 mg | ORAL_TABLET | Freq: Every day | ORAL | Status: DC
  Administered 2019-10-27 – 2019-10-29 (×3): 40 mg via ORAL
  Filled 2019-10-26 (×3): qty 2

## 2019-10-26 NOTE — Evaluation (Signed)
Physical Therapy Evaluation Patient Details Name: Tyler [name] MRN: [id number] DOB: 25-Mar-1956 Today's Date: 10/26/2019   History of Present Illness  Per MD notes: Pt is a 64 y.o. male with medical history significant of sCHF with EF of 30%, HTN, COPD on 3 L oxygen, BPH, GERD, AICD placement, CAD, PVD, A-fib, factor V Leyden mutation, depression with anxiety, GERD, remote R AKA amputation, and recent L transmetatarsal ampuation 10/20/19 who presents with shortness breath.  MD assessment includes: Acute on chronic respiratory failure with hypoxia, acute on chronic CHF, COPD exacerbation, and osteomyelitis of left leg.    Clinical Impression   Pt pleasant and motivated to participate during the session.  Pt put forth good effort during the session and did not require physical assistance with bed mobility or SPT training.  After transferring to the chair the pt was moderately SOB but resolved quickly and SpO2 and HR were both WNL throughout the session.  Patient suffers from LLE foot transmetatarsal amputation combined with remote RLE AKA amputation which impairs his ability to perform daily activities like toileting, feeding, dressing, grooming, bathing in the home. A cane, walker, crutch will not resolve the patient's issue with performing activities of daily living. A lightweight wheelchair and cushion is required/recommended and will allow patient to safely perform daily activities. Patient can safely propel the wheelchair in the home or has a caregiver who can provide assistance. Pt will benefit from HHPT services upon discharge to safely address deficits listed in patient problem list for decreased caregiver assistance and eventual return to PLOF.      Follow Up Recommendations Home health PT;Supervision - Intermittent    Equipment Recommendations  Wheelchair (measurements PT) (pt lives in mobile home and would benefit from a narrow width w/c if appropriate)   Recommendations for Other  Services       Precautions / Restrictions Precautions Precautions: Fall Restrictions Weight Bearing Restrictions: Yes LLE Weight Bearing: Partial weight bearing LLE Partial Weight Bearing Percentage or Pounds: Per Dr. [name] pt may WB through the L heel for transfers only with post-op shoe donned.      Mobility  Bed Mobility Overal bed mobility: Modified Independent             General bed mobility comments: Extra time and effort but no physical assistance needed  Transfers Overall transfer level: Needs assistance Equipment used: None Transfers: Stand Pivot Transfers   Stand pivot transfers: Min guard       General transfer comment: Min verbal cues for hand placement and for sequencing to maintain WB to L heel only, post-op shoe donned  Ambulation/Gait             General Gait Details: Pt non-ambulatory  Stairs            Wheelchair Mobility    Modified Rankin (Stroke Patients Only)       Balance Overall balance assessment: No apparent balance deficits (not formally assessed)                                           Pertinent Vitals/Pain Pain Assessment: No/denies pain    Home Living Family/patient expects to be discharged to:: Private residence Living Arrangements: Spouse/significant other Available Help at Discharge: Family;Available 24 hours/day Type of Home: Mobile home Home Access: Ramped entrance     Home Layout: One level Home Equipment: Bedside commode;Wheelchair - [employer]  chair      Prior Function Level of Independence: Needs assistance   Gait / Transfers Assistance Needed: Pt Ind with SPT to various surfaces including transport chair, BSC, and bed; no falls in last 12 months  ADL's / Homemaking Assistance Needed: Spouse assists with ADLs, pushing pt when he is in the transport chair, and driving        Hand Dominance   Dominant Hand: Right    Extremity/Trunk Assessment   Upper Extremity  Assessment Upper Extremity Assessment: Overall WFL for tasks assessed    Lower Extremity Assessment Lower Extremity Assessment: Generalized weakness       Communication   Communication: No difficulties  Cognition Arousal/Alertness: Awake/alert Behavior During Therapy: WFL for tasks assessed/performed Overall Cognitive Status: Within Functional Limits for tasks assessed                                        General Comments      Exercises Total Joint Exercises Ankle Circles/Pumps: AROM;Strengthening;Left;10 reps Quad Sets: Strengthening;Left;10 reps Towel Squeeze: Strengthening;Both;10 reps Long Arc Quad: Strengthening;Left;10 reps Knee Flexion: Strengthening;Left;10 reps Other Exercises Other Exercises: Pt education provided on precautions/restrictions provided by MD for WB to left heel for transfers only with post-op shoe donned Other Exercises: SPT training with cues for precaution compliance, general sequencing, and transfering towards the strong side   Assessment/Plan    PT Assessment Patient needs continued PT services  PT Problem List Decreased strength;Decreased activity tolerance;Decreased balance;Decreased mobility;Decreased safety awareness;Decreased knowledge of precautions       PT Treatment Interventions DME instruction;Functional mobility training;Therapeutic activities;Therapeutic exercise;Balance training;Patient/family education    PT Goals (Current goals can be found in the Care Plan section)  Acute Rehab PT Goals Patient Stated Goal: To get back home PT Goal Formulation: With patient Time For Goal Achievement: 11/08/19 Potential to Achieve Goals: Good    Frequency Min 2X/week   Barriers to discharge        Co-evaluation               AM-PAC PT "6 Clicks" Mobility  Outcome Measure Help needed turning from your back to your side while in a flat bed without using bedrails?: A Little Help needed moving from lying on your  back to sitting on the side of a flat bed without using bedrails?: A Little Help needed moving to and from a bed to a chair (including a wheelchair)?: A Little Help needed standing up from a chair using your arms (e.g., wheelchair or bedside chair)?: A Little Help needed to walk in hospital room?: Total Help needed climbing 3-5 steps with a railing? : Total 6 Click Score: 14    End of Session Equipment Utilized During Treatment: Gait belt;Oxygen Activity Tolerance: Patient tolerated treatment well Patient left: in chair;with call bell/phone within reach;with family/visitor present;with chair alarm set Nurse Communication: Mobility status;Weight bearing status;Precautions PT Visit Diagnosis: Muscle weakness (generalized) (M62.81)    Time: [id number] PT Time Calculation (min) (ACUTE ONLY): 33 min   Charges:   PT Evaluation $PT Eval Moderate Complexity: 1 Mod PT Treatments $Therapeutic Activity: 8-22 mins        D. Royetta Asal PT, DPT 10/26/19, 4:30 PM

## 2019-10-26 NOTE — ED Notes (Signed)
Family at bedside. 

## 2019-10-26 NOTE — ED Notes (Signed)
Admit MD messaged regarding wound consult for pt

## 2019-10-26 NOTE — ED Notes (Signed)
Duoderm applied to pt's bottom. Pt has redness noted to bottom.

## 2019-10-26 NOTE — Evaluation (Signed)
Occupational Therapy Evaluation Patient Details Name: Tyler [name] MRN: [id number] DOB: May 08, 1956 Today's Date: 10/26/2019    History of Present Illness Per MD notes: Pt is a 64 y.o. male with medical history significant of sCHF with EF of 30%, HTN, COPD on 3 L oxygen, BPH, GERD, AICD placement, CAD, PVD, A-fib, factor V Leyden mutation, depression with anxiety, GERD, remote R AKA amputation, and recent L transmetatarsal ampuation 10/20/19 who presents with shortness breath.  MD assessment includes: Acute on chronic respiratory failure with hypoxia, acute on chronic CHF, COPD exacerbation, and osteomyelitis of left leg.   Clinical Impression   Pt was seen for OT evaluation this date. Prior to hospital admission, pt was MOD I with ADL transfers, req'ed TOTAL A with fxl mobility d/t using transport chair (2/2 narrow size to be able to fit in hallways of home and be placed in/out of vehicle by pt's spouse. Pt lives in mobile home with spouse with ramped entrance. Currently pt demonstrates impairments as described below (See OT problem list) which functionally limit his ability to perform ADL/self-care tasks. Pt currently requires CGA for ADL transfers (in part r/t WB restrictions post-op) and MOD/MAX A for LB ADLs.  Pt would benefit from skilled OT to address noted impairments and functional limitations (see below for any additional details) in order to maximize safety and independence while minimizing falls risk and caregiver burden. Upon hospital discharge, recommend HHOT to maximize pt safety and return to functional independence during meaningful occupations of daily life. Of note: OT recommends lightweight manual w/c with narrow measurements (have asked pt's spouse to f/u with hallway measurements at home, anticipate 18" w/c will accommodate pt's size) so that pt can self-propel and so that spouse is able to lift into/out of vehicle and transport.    Follow Up Recommendations  Home health OT     Equipment Recommendations  Wheelchair (measurements OT);Wheelchair cushion (measurements OT)(narrow for mobility in hallway of home, anticipate 18" width to be appropriate)    Recommendations for Other Services       Precautions / Restrictions Precautions Precautions: Fall Restrictions Weight Bearing Restrictions: Yes LLE Weight Bearing: Partial weight bearing LLE Partial Weight Bearing Percentage or Pounds: Per Dr. [name] pt may WB through the L heel for transfers only with post-op shoe donned.      Mobility Bed Mobility Overal bed mobility: Modified Independent             General bed mobility comments: up to chair when OT presents  Transfers Overall transfer level: Needs assistance Equipment used: None Transfers: Stand Pivot Transfers   Stand pivot transfers: Min guard       General transfer comment: Pt had just gotten to chair with PT prior to OT assessment, further mobilization deferred at this time    Balance Overall balance assessment: No apparent balance deficits (not formally assessed)                                         ADL either performed or assessed with clinical judgement   ADL                                         General ADL Comments: Pt requires MOD/MAX A with LB ADLs, and CGA for ADL transfers. Able to perform  UB ADLs seated with setup to Indep with exception of fine motro tasks such as buttoning shirt and opening screw top on toothepaste.     Vision Patient Visual Report: No change from baseline       Perception     Praxis      Pertinent Vitals/Pain Pain Assessment: No/denies pain     Hand Dominance Right   Extremity/Trunk Assessment Upper Extremity Assessment Upper Extremity Assessment: Overall WFL for tasks assessed;RUE deficits/detail;LUE deficits/detail RUE Sensation: decreased light touch RUE Coordination: decreased fine motor LUE Sensation: decreased light touch LUE Coordination:  decreased fine motor   Lower Extremity Assessment Lower Extremity Assessment: Defer to PT evaluation;Generalized weakness       Communication Communication Communication: No difficulties   Cognition Arousal/Alertness: Awake/alert Behavior During Therapy: WFL for tasks assessed/performed Overall Cognitive Status: Within Functional Limits for tasks assessed                                     General Comments       Exercises Other Exercises: OT facilitates education re: role of OT in acute setting as well as implications for after d/c from acute setting. Pt and spouse with good understanding and reception. Other Exercises: OT facilitates education with pt and spouse re: benefits of self-propelling for fxl mobility versus being pushed. Introdued topic of lightweight manual w/c to maintain/improve arm strength, improve activity tolerance, improve independence, improve quality of life and potential benefits for posture/oxygenation as well. Pt and spouse in agreement. OT f/u'ed with CM following session to inquire about AD.   Shoulder Instructions      Home Living Family/patient expects to be discharged to:: Private residence Living Arrangements: Spouse/significant other Available Help at Discharge: Family;Available 24 hours/day Type of Home: Mobile home Home Access: Ramped entrance     Home Layout: One level     Bathroom Shower/Tub: Walk-in shower;Door   Bathroom Toilet: Handicapped height Bathroom Accessibility: Yes   Home Equipment: Bedside commode;Wheelchair - [employer]          Prior Functioning/Environment Level of Independence: Needs assistance  Gait / Transfers Assistance Needed: Pt reports primarily using crutches for fxl mobility prior to 2018, states that d/t spinal stenosis and decreased sensation in hands, that he has used transport chair since that time and spouse pushes him. While pt has manual w/c and can use UEs to propel it, it is  from [place] and bulky which prevents it from fitting in hallways of his home.  In addition, it is too heavy for his spouse to load in/out of vehicle. ADL's / Homemaking Assistance Needed: Pt reports being able to do all UB ADLs I'ly, needs some assist for LB ADLs at baseline. Pt's spouse drives, gets groceries and performes household IADLs.            OT Problem List: Decreased strength;Decreased activity tolerance;Decreased coordination;Decreased knowledge of use of DME or AE      OT Treatment/Interventions: Self-care/ADL training;Therapeutic exercise;Energy conservation;Therapeutic activities;Balance training;DME and/or AE instruction;Patient/family education    OT Goals(Current goals can be found in the care plan section) Acute Rehab OT Goals Patient Stated Goal: To get back home OT Goal Formulation: With patient Time For Goal Achievement: 11/09/19 Potential to Achieve Goals: Good  OT Frequency: Min 2X/week   Barriers to D/C:            Co-evaluation  AM-PAC OT "6 Clicks" Daily Activity     Outcome Measure Help from another person eating meals?: None Help from another person taking care of personal grooming?: None Help from another person toileting, which includes using toliet, bedpan, or urinal?: A Little Help from another person bathing (including washing, rinsing, drying)?: A Lot Help from another person to put on and taking off regular upper body clothing?: A Little Help from another person to put on and taking off regular lower body clothing?: A Lot 6 Click Score: 18   End of Session    Activity Tolerance: Patient tolerated treatment well Patient left: in chair;with call bell/phone within reach;with family/visitor present  OT Visit Diagnosis: Other abnormalities of gait and mobility (R26.89);Muscle weakness (generalized) (M62.81)                Time: [id number] OT Time Calculation (min): 38 min Charges:  OT General Charges $OT Visit: 1 Visit OT  Evaluation $OT Eval Moderate Complexity: 1 Mod OT Treatments $Self Care/Home Management : 8-22 mins $Therapeutic Activity: 8-22 mins  Gerrianne Scale, MS, OTR/L ascom [phone] 10/26/19, 6:07 PM

## 2019-10-26 NOTE — Consult Note (Signed)
Pharmacy Consult for Warfarin Indication: atrial fibrillation      Allergies  Allergen Reactions  . Apixaban Rash  . Rivaroxaban Rash    Patient Measurements: Height: 6\' 1"  (185.4 cm) Weight: 73.9 kg (163 lb) IBW/kg (Calculated) : 79.9  Vital Signs: Temp: 98.9 F (37.2 C) (06/02 0648) Temp Source: Oral (06/02 0648) BP: 113/57 (06/02 1000) Pulse Rate: 75 (06/02 1000)  Labs: Recent Labs (last 2 labs)        Recent Labs    10/23/19 0503 10/24/19 0429 10/25/19 0646  HGB 7.7*  --  8.3*  HCT 22.5*  --  25.9*  PLT 279  --  404*  LABPROT 17.5* 18.3*  --   INR 1.5* 1.6*  --   CREATININE 0.74  --  0.75  TROPONINIHS  --   --  15      Estimated Creatinine Clearance: 97.5 mL/min (by C-G formula based on SCr of 0.75 mg/dL).   Medical History:     Past Medical History:  Diagnosis Date  . AICD (automatic cardioverter/defibrillator) present   . Anxiety   . Arthritis   . Atherosclerosis of artery of extremity with ulceration ([place]) 09/2019   left foot s/p toe amp requiring debridement and futher toe amputations.  . Atrial fibrillation ([place])   . Cervical spinal stenosis    with neuropathy  . CHF (congestive heart failure) ([place])   . Constipation   . COPD (chronic obstructive pulmonary disease) ([place])   . Coronary artery disease   . Depression   . Dyspnea   . Dysrhythmia    atrial fibrillation  . Emphysema of lung ([place])   . GERD (gastroesophageal reflux disease)   . Hypertension   . Lung nodule seen on imaging study    being followed by dr. [name]. just watching it for last few years, without change  . Myocardial infarction [facility]) 2004   stent placed, pacemaker implanted 2005  . Oxygen dependent    requires 2L nasal prong oxygen 24 hours a day  . Peripheral vascular disease ([place])   . Presence of permanent cardiac pacemaker [phone]    Medications:  (Not in a hospital admission)  Scheduled:  . furosemide  40 mg Intravenous Q12H   . ipratropium-albuterol  3 mL Nebulization Q4H  . methylPREDNISolone (SOLU-MEDROL) injection  40 mg Intravenous Q12H   Infusions:   PRN: acetaminophen, albuterol, dextromethorphan-guaiFENesin, hydrALAZINE, ondansetron (ZOFRAN) IV, oxyCODONE-acetaminophen    Anti-infectives (From admission, onward)     None        Assessment: Pharmacy consulted to monitor warfarin for afib. Patient states he takes warfarin 5 mg daily which was resumed on 6/2. Patient is on ciprofloxacin which can increase INR.  Date INR Warfarin Dose  6/1 1.6   6/2 1.8 5 mg  6/3 1.9     Goal of Therapy:  INR 2-3 Monitor platelets by anticoagulation protocol: Yes   Plan:  INR is subtherapeutic but almost at goal will continue warfarin 5 mg tonight (home dose). Daily INR ordered. CBC every 3 days at least. Will monitor INR closely while pt is on abx.  Jacobo Forest, PharmD Candidate 10/26/2019, 12:44 PM

## 2019-10-26 NOTE — ED Notes (Signed)
Pt given breakfast tray

## 2019-10-26 NOTE — ED Notes (Signed)
Pt placed on 6L [place] at this time so he can enjoy some coffee. Pt maintains O2 at 100%.

## 2019-10-26 NOTE — Progress Notes (Signed)
PROGRESS NOTE    Tyler [name]  [id number] DOB: 30-Oct-1955 DOA: 10/25/2019 PCP: [name], MD   Brief Narrative:  Tyler [name] is a 64 y.o. male with medical history significant of sCHF with EF of 30%, hypertension, COPD on 3 L oxygen, BPH, GERD, AICD placement, CAD, PVD, atrial fibrillation on Coumadin, factor V Leyden mutation, depression with anxiety, GERD, who presents with shortness breath.  Patient was recently hospitalized from 5/25-6/1 due to left lower extremity cellulitis with associated left foot osteomyelitis. 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 Wound cultures shows Pseudomonas resistant to Zosyn with moderateresistanceto cefepime. Patient was discharged onAugmentin and Cipro follow-up with Dr.Cline. Pt developed shortness breath since yesterday, which has been progressively worsening.  Patient has a dry cough, no chest pain, fever or chills.  Patient was initially treated with BiPAP and then transition to 6 L. BNP at 525.  Chest x-ray concerning for interstitial pulmonary edema.  Subjective: Patient was little short of breath when seen today with her nasal cannula on the floor.  Shortness of breath improved and he started saturating in high 90s once nasal cannula was replaced on 3 L.  Assessment & Plan:   Principal Problem:   Acute on chronic respiratory failure with hypoxia (HCC) Active Problems:   Essential hypertension, benign   COPD with acute exacerbation (HCC)   Acute on chronic systolic (congestive) heart failure (HCC)   AF (paroxysmal atrial fibrillation) (HCC)   Coronary artery disease   Osteomyelitis of left leg (HCC)   BPH (benign prostatic hyperplasia)   Depression with anxiety  Acute on chronic respiratory failure with hypoxia [facility]):  Acute on chronic systolic (congestive) heart failure ([place]):  2D echo on 12/19/2018 showed EF of 30%.  Did receive quite a bit of fluid during recent hospitalization and  surgery. Responded well to IV diuresis.  Saturating well on his home oxygen of 3L. -Continue IV diuresis as patient still appears volume overload. -Continue with strict intake and output. -Daily BMP and weights. -Continue Entresto  COPD with acute exacerbation.  There was some concern of COPD exacerbation on admission.  No wheezing today. -Switch Solu-Medrol with prednisone. -Continue bronchodilators. -Continue supportive care.  Hypertension.  Blood pressure within normal limit. -Continue IV Lasix, Entresto and metoprolol.  AF (paroxysmal atrial fibrillation) (HCC) -Couamdin per pharm -metoprolol  Coronary artery disease: no CP -ASA and statin   Osteomyelitis of left leg ([place]): Recent transmetatarsal amputation. -continue Augmentin and Cipro  -I contacted Dr. Vickki Muff for follow-up he will see the patient. -Wound care was consulted but they are not comfortable treating his recent transmetatarsal amputation wound.  BPH (benign prostatic hyperplasia) -Flomax  Depression with anxiety: -Continue home Xanax, Celexa  GERD: -Protonix  Objective: Vitals:   10/26/19 0919 10/26/19 0930 10/26/19 1000 10/26/19 1139  BP: (!) 128/44 (!) 119/101 (!) 111/54 (!) 104/55  Pulse: 73  72 75  Resp:  19 20 17   Temp:    98 F (36.7 C)  TempSrc:    Oral  SpO2:   100% 100%  Weight:    80.5 kg  Height:    6\' 1"  (1.854 m)    Intake/Output Summary (Last 24 hours) at 10/26/2019 1533 Last data filed at 10/26/2019 1345 Gross per 24 hour  Intake 480 ml  Output 2100 ml  Net -1620 ml   Filed Weights   10/25/19 0649 10/26/19 0742 10/26/19 1139  Weight: 73.9 kg 73.9 kg 80.5 kg    Examination:  General exam:  Appears calm and comfortable  Respiratory system: Few basal crackles bilaterally, no wheeze, respiratory effort normal. Cardiovascular system: S1 & S2 heard, RRR. No JVD, murmurs, rubs, . Gastrointestinal system: Soft, nontender, nondistended, bowel sounds positive. Central nervous  system: Alert and oriented. No focal neurological deficits.Symmetric 5 x 5 power. Extremities: Right AKA, left TMA with Ace wrap, 2+ left lower extremity edema, 1+ edema involving both upper extremities. Psychiatry: Judgement and insight appear normal. Mood & affect appropriate.   DVT prophylaxis: Coumadin Code Status: Full  Family Communication: Discussed with patient. Disposition Plan:  Status is: Inpatient  Remains inpatient appropriate because:Inpatient level of care appropriate due to severity of illness   Dispo: The patient is from: Home              Anticipated d/c is to: Home              Anticipated d/c date is: 2 days              Patient currently is not medically stable to d/c.  Patient appears volume overload and will need continuation of IV diuresis for 1 to 2 days.   Consultants:   Podiatry  Procedures:  Antimicrobials:  Cipro Augmentin  Data Reviewed: I have personally reviewed following labs and imaging studies  CBC: Recent Labs  Lab 10/21/19 0443 10/22/19 0410 10/23/19 0503 10/25/19 0646  WBC 10.4 10.5 8.8 9.7  HGB 7.9* 7.6* 7.7* 8.3*  HCT 24.4* 23.5* 22.5* 25.9*  MCV 90.4 90.0 86.5 89.0  PLT 351 320 279 [id number]*   Basic Metabolic Panel: Recent Labs  Lab 10/21/19 0443 10/22/19 0410 10/23/19 0503 10/25/19 0646 10/26/19 0143  NA 131* 131* 129* 133* 132*  K 4.4 4.4 4.2 4.8 4.0  CL 98 99 95* 94* 90*  CO2 27 26 27 27 30   GLUCOSE 98 90 104* 117* 131*  BUN 9 9 8  5* 8  CREATININE 0.72 0.70 0.74 0.75 0.71  CALCIUM 8.2* 8.2* 8.5* 8.8* 8.9  MG  --   --   --   --  1.7   GFR: Estimated Creatinine Clearance: 105.4 mL/min (by C-G formula based on SCr of 0.71 mg/dL). Liver Function Tests: Recent Labs  Lab 10/25/19 0646  AST 28  ALT 19  ALKPHOS 57  BILITOT 0.6  PROT 6.3*  ALBUMIN 3.0*   No results for input(s): LIPASE, AMYLASE in the last 168 hours. No results for input(s): AMMONIA in the last 168 hours. Coagulation Profile: Recent Labs  Lab  10/23/19 0503 10/24/19 0429 10/25/19 1450 10/26/19 0143  INR 1.5* 1.6* 1.8* 1.9*   Cardiac Enzymes: No results for input(s): CKTOTAL, CKMB, CKMBINDEX, TROPONINI in the last 168 hours. BNP (last 3 results) No results for input(s): PROBNP in the last 8760 hours. HbA1C: No results for input(s): HGBA1C in the last 72 hours. CBG: No results for input(s): GLUCAP in the last 168 hours. Lipid Profile: No results for input(s): CHOL, HDL, LDLCALC, TRIG, CHOLHDL, LDLDIRECT in the last 72 hours. Thyroid Function Tests: No results for input(s): TSH, T4TOTAL, FREET4, T3FREE, THYROIDAB in the last 72 hours. Anemia Panel: No results for input(s): VITAMINB12, FOLATE, FERRITIN, TIBC, IRON, RETICCTPCT in the last 72 hours. Sepsis Labs: Recent Labs  Lab 10/26/19 1145  PROCALCITON <0.10    Recent Results (from the past 240 hour(s))  Culture, blood (Routine x 2)     Status: None   Collection Time: 10/17/19  5:20 PM   Specimen: BLOOD  Result Value Ref Range Status  Specimen Description BLOOD RIGHT ANTECUBITAL  Final   Special Requests   Final    BOTTLES DRAWN AEROBIC AND ANAEROBIC Blood Culture adequate volume   Culture   Final    NO GROWTH 5 DAYS Performed at [facility], [address] [place], [place] [address]    Report Status 10/22/2019 FINAL  Final  Culture, blood (Routine x 2)     Status: None   Collection Time: 10/17/19  5:25 PM   Specimen: BLOOD LEFT HAND  Result Value Ref Range Status   Specimen Description BLOOD LEFT HAND  Final   Special Requests   Final    BOTTLES DRAWN AEROBIC AND ANAEROBIC Blood Culture adequate volume   Culture   Final    NO GROWTH 5 DAYS Performed at [facility], [address] [place], [place] [address]    Report Status 10/23/2019 FINAL  Final  Aerobic/Anaerobic Culture (surgical/deep wound)     Status: None   Collection Time: 10/18/19  1:05 PM   Specimen: Foot; Abscess  Result Value Ref Range Status   Specimen Description    Final    FOOT LEFT FOOT Performed at [facility], [address] [place], [place] [address]    Special Requests   Final    NONE Performed at [facility], [address] [place], [place] [address]    Gram Stain   Final    ABUNDANT WBC PRESENT, PREDOMINANTLY PMN ABUNDANT GRAM POSITIVE COCCI ABUNDANT GRAM NEGATIVE RODS    Culture   Final    ABUNDANT PSEUDOMONAS AERUGINOSA FEW STAPHYLOCOCCUS AUREUS FEW GROUP B STREP(S.AGALACTIAE)ISOLATED TESTING AGAINST S. AGALACTIAE NOT ROUTINELY PERFORMED DUE TO PREDICTABILITY OF AMP/PEN/VAN SUSCEPTIBILITY. NO ANAEROBES ISOLATED Performed at [facility], [place] [address], [place], [place] [address]    Report Status 10/21/2019 FINAL  Final   Organism ID, Bacteria PSEUDOMONAS AERUGINOSA  Final   Organism ID, Bacteria STAPHYLOCOCCUS AUREUS  Final      Susceptibility   Pseudomonas aeruginosa - MIC*    CEFTAZIDIME 2 SENSITIVE Sensitive     CIPROFLOXACIN <=0.25 SENSITIVE Sensitive     GENTAMICIN <=1 SENSITIVE Sensitive     IMIPENEM 2 SENSITIVE Sensitive     PIP/TAZO 8 SENSITIVE Sensitive     CEFEPIME <=1 SENSITIVE Sensitive     * ABUNDANT PSEUDOMONAS AERUGINOSA   Staphylococcus aureus - MIC*    CIPROFLOXACIN <=0.5 SENSITIVE Sensitive     ERYTHROMYCIN >=8 RESISTANT Resistant     GENTAMICIN <=0.5 SENSITIVE Sensitive     OXACILLIN 0.5 SENSITIVE Sensitive     TETRACYCLINE <=1 SENSITIVE Sensitive     VANCOMYCIN 1 SENSITIVE Sensitive     TRIMETH/SULFA <=10 SENSITIVE Sensitive     CLINDAMYCIN <=0.25 SENSITIVE Sensitive     RIFAMPIN <=0.5 SENSITIVE Sensitive     Inducible Clindamycin NEGATIVE Sensitive     * FEW STAPHYLOCOCCUS AUREUS  SARS Coronavirus 2 by RT PCR (hospital order, performed in [place] hospital lab) Nasopharyngeal Nasopharyngeal Swab     Status: None   Collection Time: 10/18/19  3:54 PM   Specimen: Nasopharyngeal Swab  Result Value Ref Range Status   SARS Coronavirus 2 NEGATIVE NEGATIVE Final     Comment: (NOTE) SARS-CoV-2 target nucleic acids are NOT DETECTED. The SARS-CoV-2 RNA is generally detectable in upper and lower respiratory specimens during the acute phase of infection. The lowest concentration of SARS-CoV-2 viral copies this assay can detect is 250 copies / mL. A negative result does not preclude SARS-CoV-2 infection  and should not be used as the sole basis for treatment or other patient management decisions.  A negative result may occur with improper specimen collection / handling, submission of specimen other than nasopharyngeal swab, presence of viral mutation(s) within the areas targeted by this assay, and inadequate number of viral copies (<250 copies / mL). A negative result must be combined with clinical observations, patient history, and epidemiological information. Fact Sheet for Patients:   StrictlyIdeas.no Fact Sheet for Healthcare Providers: BankingDealers.co.za This test is not yet approved or cleared  by the [place] FDA and has been authorized for detection and/or diagnosis of SARS-CoV-2 by FDA under an Emergency Use Authorization (EUA).  This EUA will remain in effect (meaning this test can be used) for the duration of the COVID-19 declaration under Section 564(b)(1) of the Act, 21 U.S.C. section 360bbb-3(b)(1), unless the authorization is terminated or revoked sooner. Performed at [facility], [address] [place], [place] [address]   Surgical pcr screen     Status: Abnormal   Collection Time: 10/20/19  2:18 AM   Specimen: Nasal Mucosa; Nasal Swab  Result Value Ref Range Status   MRSA, PCR NEGATIVE NEGATIVE Final   Staphylococcus aureus POSITIVE (A) NEGATIVE Final    Comment: (NOTE) The Xpert SA Assay (FDA approved for NASAL specimens in patients 38 years of age and older), is one component of a comprehensive surveillance program. It is not intended to diagnose infection nor  to guide or monitor treatment. Performed at [facility], [address], [place], [place] [address]   Aerobic/Anaerobic Culture (surgical/deep wound)     Status: None   Collection Time: 10/20/19 11:39 AM   Specimen: Wound  Result Value Ref Range Status   Specimen Description   Final    WOUND Performed at [facility], [address] [place], [place] [address]    Special Requests   Final    NONE Performed at [facility], [address] [place], [place] [address]    Gram Stain   Final    NO WBC SEEN ABUNDANT GRAM POSITIVE COCCI IN PAIRS    Culture   Final    ABUNDANT PSEUDOMONAS AERUGINOSA FEW STAPHYLOCOCCUS AUREUS ABUNDANT BACTEROIDES THETAIOTAOMICRON BETA LACTAMASE POSITIVE Performed at [facility], [place] [address], [place], [place] [address]    Report Status 10/23/2019 FINAL  Final   Organism ID, Bacteria PSEUDOMONAS AERUGINOSA  Final   Organism ID, Bacteria STAPHYLOCOCCUS AUREUS  Final      Susceptibility   Pseudomonas aeruginosa - MIC*    CEFTAZIDIME 16 INTERMEDIATE Intermediate     CIPROFLOXACIN <=0.25 SENSITIVE Sensitive     GENTAMICIN <=1 SENSITIVE Sensitive     IMIPENEM 2 SENSITIVE Sensitive     CEFEPIME INTERMEDIATE Intermediate     * ABUNDANT PSEUDOMONAS AERUGINOSA   Staphylococcus aureus - MIC*    CIPROFLOXACIN <=0.5 SENSITIVE Sensitive     ERYTHROMYCIN <=0.25 SENSITIVE Sensitive     GENTAMICIN <=0.5 SENSITIVE Sensitive     OXACILLIN <=0.25 SENSITIVE Sensitive     TETRACYCLINE >=16 RESISTANT Resistant     VANCOMYCIN 1 SENSITIVE Sensitive     TRIMETH/SULFA <=10 SENSITIVE Sensitive     CLINDAMYCIN <=0.25 SENSITIVE Sensitive     RIFAMPIN <=0.5 SENSITIVE Sensitive     Inducible Clindamycin NEGATIVE Sensitive     * FEW STAPHYLOCOCCUS AUREUS  SARS Coronavirus 2 by RT PCR (hospital order, performed in [place] hospital lab) Nasopharyngeal Nasopharyngeal Swab     Status:  None   Collection Time: 10/25/19 10:29 AM    Specimen: Nasopharyngeal Swab  Result Value Ref Range Status   SARS Coronavirus 2 NEGATIVE NEGATIVE Final    Comment: (NOTE) SARS-CoV-2 target nucleic acids are NOT DETECTED. The SARS-CoV-2 RNA is generally detectable in upper and lower respiratory specimens during the acute phase of infection. The lowest concentration of SARS-CoV-2 viral copies this assay can detect is 250 copies / mL. A negative result does not preclude SARS-CoV-2 infection and should not be used as the sole basis for treatment or other patient management decisions.  A negative result may occur with improper specimen collection / handling, submission of specimen other than nasopharyngeal swab, presence of viral mutation(s) within the areas targeted by this assay, and inadequate number of viral copies (<250 copies / mL). A negative result must be combined with clinical observations, patient history, and epidemiological information. Fact Sheet for Patients:   StrictlyIdeas.no Fact Sheet for Healthcare Providers: BankingDealers.co.za This test is not yet approved or cleared  by the [place] FDA and has been authorized for detection and/or diagnosis of SARS-CoV-2 by FDA under an Emergency Use Authorization (EUA).  This EUA will remain in effect (meaning this test can be used) for the duration of the COVID-19 declaration under Section 564(b)(1) of the Act, 21 U.S.C. section 360bbb-3(b)(1), unless the authorization is terminated or revoked sooner. Performed at [facility], [address] [place], [place] [address]   Culture, sputum-assessment     Status: None   Collection Time: 10/25/19  4:28 PM   Specimen: Expectorated Sputum  Result Value Ref Range Status   Specimen Description EXPECTORATED SPUTUM  Final   Special Requests NONE  Final   Sputum evaluation   Final    Sputum specimen not acceptable for testing.  Please recollect.   RESULT CALLED TO, READ  BACK BY AND VERIFIED WITH: JESSICA FULCHER 10/25/19 1654 KLW Performed at [facility], [address] [place], [place] [address]    Report Status 10/25/2019 FINAL  Final     Radiology Studies: CT ANGIO CHEST PE W OR WO CONTRAST  Result Date: 10/26/2019 CLINICAL DATA:  Show of breath EXAM: CT ANGIOGRAPHY CHEST WITH CONTRAST TECHNIQUE: Multidetector CT imaging of the chest was performed using the standard protocol during bolus administration of intravenous contrast. Multiplanar CT image reconstructions and MIPs were obtained to evaluate the vascular anatomy. CONTRAST:  92mL OMNIPAQUE IOHEXOL 350 MG/ML SOLN COMPARISON:  Chest CT May 05, 2019; chest radiograph October 25, 2019 FINDINGS: Cardiovascular: There is no demonstrable pulmonary embolus. There is no appreciable thoracic aortic aneurysm or dissection. There are scattered foci of calcification in visualized great vessels. There are foci of atherosclerotic calcification in the aorta. There are multiple foci of coronary artery calcification. No pericardial effusion or pericardial thickening is evident. There is a pacemaker present with lead tips attached to the right ventricle and coronary sinus. Mediastinum/Nodes: Thyroid appears normal. There is a lymph node to the left of the distal trachea measuring 1.2 x 1.0 cm, unchanged from previous study. Scattered subcentimeter lymph nodes elsewhere noted. No esophageal lesions are evident. Lungs/Pleura: There is centrilobular emphysematous change, stable. Scarring in the anterior right upper lobe near the apex is stable. There are pleural effusions bilaterally, larger on the right than on the left, with bibasilar compressive atelectasis. There may be superimposed pneumonia in these areas of atelectasis. Upper Abdomen: In the visualized upper abdomen, there is aortic atherosclerosis. There are calcified splenic granulomas. Visualized upper abdominal structures  otherwise appear unremarkable.  Musculoskeletal: There is stable anterior wedging of the T10 vertebral body. No blastic or lytic bone lesions. No evident chest wall lesions. Pacemaker device on the left anteriorly. Review of the MIP images confirms the above findings. IMPRESSION: 1. No demonstrable pulmonary embolus. No thoracic aortic aneurysm or dissection. There is aortic atherosclerosis as well as foci of great vessel and coronary artery calcification. Pacemaker leads attached to right ventricle and coronary sinus. 2. Underlying centrilobular emphysematous change. Pleural effusions bilaterally, larger on the right than on the left, with compressive atelectasis in both lung bases. A degree of superimposed pneumonia in these areas of atelectasis cannot be entirely excluded. 3. Mildly prominent left paratracheal lymph node, stable. No new lymph node prominence. 4.  Calcified splenic granulomas. Aortic Atherosclerosis (ICD10-I70.0) and Emphysema (ICD10-J43.9). Electronically Signed   By: Lowella Grip III M.D.   On: 10/26/2019 09:09   US Venous Img Lower Unilateral Left (DVT)  Result Date: 10/26/2019 CLINICAL DATA:  Left lower extremity edema EXAM: Left LOWER EXTREMITY VENOUS DOPPLER ULTRASOUND TECHNIQUE: Gray-scale sonography with graded compression, as well as color Doppler and duplex ultrasound were performed to evaluate the lower extremity deep venous systems from the level of the common femoral vein and including the common femoral, femoral, profunda femoral, popliteal and calf veins including the posterior tibial, peroneal and gastrocnemius veins when visible. The superficial great saphenous vein was also interrogated. Spectral Doppler was utilized to evaluate flow at rest and with distal augmentation maneuvers in the common femoral, femoral and popliteal veins. COMPARISON:  None. FINDINGS: Contralateral Common Femoral Vein: Respiratory phasicity is normal and symmetric with the symptomatic side. No evidence of thrombus. Normal  compressibility. Common Femoral Vein: No evidence of thrombus. Normal compressibility, respiratory phasicity and response to augmentation. Saphenofemoral Junction: No evidence of thrombus. Normal compressibility and flow on color Doppler imaging. Profunda Femoral Vein: No evidence of thrombus. Normal compressibility and flow on color Doppler imaging. Femoral Vein: No evidence of thrombus. Normal compressibility, respiratory phasicity and response to augmentation. Popliteal Vein: No evidence of thrombus. Normal compressibility, respiratory phasicity and response to augmentation. Calf Veins: No evidence of thrombus. Normal compressibility and flow on color Doppler imaging. Superficial Great Saphenous Vein: No evidence of thrombus. Normal compressibility. Venous Reflux:  None. Other Findings:  None. IMPRESSION: No evidence of deep venous thrombosis. Electronically Signed   By: Prudencio Pair M.D.   On: 10/26/2019 04:17   DG Chest Portable 1 View  Result Date: 10/25/2019 CLINICAL DATA:  Shortness of breath EXAM: PORTABLE CHEST 1 VIEW COMPARISON:  10/21/2019 FINDINGS: Cardiomegaly with biventricular ICD/pacer. Mild interstitial coarsening with a few Kerley lines and trace right pleural effusion. Emphysema. No consolidation or pneumothorax. IMPRESSION: Mild interstitial opacity which could be related to patient's emphysema or early pulmonary edema. Electronically Signed   By: Monte Fantasia M.D.   On: 10/25/2019 07:50   ECHOCARDIOGRAM COMPLETE  Result Date: 10/26/2019    ECHOCARDIOGRAM REPORT   Patient Name:   Tyler [name] Date of Exam: 10/25/2019 Medical Rec #:  [id number]         Height:       73.0 in Accession #:    [id number]        Weight:       163.0 lb Date of Birth:  01/06/1956          BSA:          1.972 m Patient Age:    55 years  BP:           132/65 mmHg Patient Gender: M                 HR:           78 bpm. Exam Location:  [facility] Procedure: 2D Echo, Cardiac Doppler and Color Doppler Indications:      [id number] Acute Diastolic CHF  History:         Patient has no prior history of Echocardiogram examinations.                  Defibrillator. Myocardial infarction. Peripheral vascular                  disease. Lung Emphysema. Dyspnea. Atrial Fibrillation. COPD.                  Coronary artery disease.  Sonographer:     Wilford Sports Rodgers-Jones Referring Phys:  [id number] NIU Diagnosing Phys: Bartholome Bill MD IMPRESSIONS  1. Left ventricular ejection fraction, by estimation, is 55 to 60%. Left ventricular ejection fraction by PLAX is 62 %. The left ventricle has normal function. The left ventricle demonstrates regional wall motion abnormalities (see scoring diagram/findings for description). Left ventricular diastolic parameters were normal.  2. Right ventricular systolic function is normal. The right ventricular size is normal.  3. Left atrial size was moderately dilated.  4. The mitral valve was not well visualized. Mild to moderate mitral valve regurgitation.  5. The aortic valve was not well visualized. Aortic valve regurgitation is trivial. FINDINGS  Left Ventricle: Left ventricular ejection fraction, by estimation, is 55 to 60%. Left ventricular ejection fraction by PLAX is 62 %. The left ventricle has normal function. The left ventricle demonstrates regional wall motion abnormalities. The left ventricular internal cavity size was normal in size. There is no left ventricular hypertrophy. Left ventricular diastolic parameters were normal.  LV Wall Scoring: The mid anteroseptal segment is hypokinetic. Right Ventricle: The right ventricular size is normal. No increase in right ventricular wall thickness. Right ventricular systolic function is normal. Left Atrium: Left atrial size was moderately dilated. Right Atrium: Right atrial size was normal in size. Pericardium: There is no evidence of pericardial effusion. Mitral Valve: The mitral valve was not well visualized. Mild to moderate mitral valve regurgitation.  Tricuspid Valve: The tricuspid valve is grossly normal. Tricuspid valve regurgitation is mild. Aortic Valve: The aortic valve was not well visualized. Aortic valve regurgitation is trivial. Pulmonic Valve: The pulmonic valve was not well visualized. Pulmonic valve regurgitation is not visualized. Aorta: The aortic root was not well visualized. IAS/Shunts: The interatrial septum was not assessed. Additional Comments: A pacer wire is visualized.  LEFT VENTRICLE PLAX 2D LV EF:         Left            Diastology                ventricular     LV e' lateral:   9.36 cm/s                ejection        LV E/e' lateral: 14.3                fraction by     LV e' medial:    11.70 cm/s                PLAX is 62      LV E/e' medial:  11.5                %. LVIDd:         7.26 cm LVIDs:         4.78 cm LV PW:         1.08 cm LV IVS:        0.74 cm  RIGHT VENTRICLE             IVC RV Basal diam:  4.04 cm     IVC diam: 2.27 cm RV S prime:     13.40 cm/s TAPSE (M-mode): 1.8 cm LEFT ATRIUM              Index       RIGHT ATRIUM           Index LA diam:        6.00 cm  3.04 cm/m  RA Area:     19.70 cm LA Vol (A2C):   108.0 ml 54.75 ml/m RA Volume:   57.20 ml  29.00 ml/m LA Vol (A4C):   93.0 ml  47.15 ml/m LA Biplane Vol: 105.0 ml 53.23 ml/m   AORTA Ao Root diam: 3.60 cm MV E velocity: 134.00 cm/s MV A velocity: 39.60 cm/s MV E/A ratio:  3.38 Bartholome Bill MD Electronically signed by Bartholome Bill MD Signature Date/Time: 10/26/2019/12:33:28 PM    Final     Scheduled Meds: . aspirin EC  81 mg Oral Daily  . ciprofloxacin  500 mg Oral BID  . citalopram  10 mg Oral Daily  . furosemide  40 mg Intravenous Q12H  . gabapentin  300 mg Oral TID  . ipratropium-albuterol  3 mL Nebulization Q4H  . loratadine  10 mg Oral QHS  . methylPREDNISolone (SOLU-MEDROL) injection  40 mg Intravenous Q12H  . metoprolol succinate  25 mg Oral Daily  . mometasone-formoterol  2 puff Inhalation BID  . pantoprazole  40 mg Oral QAC breakfast  .  pravastatin  20 mg Oral q1800  . sacubitril-valsartan  1 tablet Oral BID  . sodium chloride flush  3 mL Intravenous Q12H  . tamsulosin  0.4 mg Oral QPC supper  . vitamin B-12  1,000 mcg Oral Daily  . warfarin  5 mg Oral ONCE-1600  . Warfarin - Pharmacist Dosing Inpatient   Does not apply q1600   Continuous Infusions: . sodium chloride       LOS: 1 day   Time spent: 40 minutes.  Lorella Nimrod, MD [facility]  If 7PM-7AM, please contact night-coverage [email or web address]  10/26/2019, 3:33 PM   This record has been created using Systems analyst. Errors have been sought and corrected,but may not always be located. Such creation errors do not reflect on the standard of care.

## 2019-10-26 NOTE — Consult Note (Signed)
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 Dr. [name] Wound type:Surgical Pressure Injury POA: N/A Measurement: Incision line is closely approximated with staples and measures 13.5cm There is a partial thickness, circular area of denudation measuring 5cm x 5.5cm at the dorsal aspect of the foot. Wound bed is red, dry. Wound bed:As described above Drainage (amount, consistency, odor) As described above Periwound: intact Dressing procedure/placement/frequency: I have provided Nursing with conservative care orders for the foot using a saline cleanse, pat dry and topical dressing of xeroform gauze covered with ABDs and secured with kerlix/paper tape. Changes will be twice daily while in house.  I communicated with Dr. Reesa Chew to let her know that I recommended consultation with Dr. [name]. She is in agreement and will consult.    Dr. Sammuel Bailiff orders will supercede any that I have provided for Nursing staff.  [place] nursing team will not follow, but will remain available to this patient, the nursing and medical teams.  Please re-consult if needed. Thanks, Maudie Flakes, MSN, RN, [place], Arther Abbott  Pager# [phone]

## 2019-10-27 LAB — BASIC METABOLIC PANEL
Anion gap: 10 (ref 5–15)
BUN: 12 mg/dL (ref 8–23)
CO2: 31 mmol/L (ref 22–32)
Calcium: 8.7 mg/dL — ABNORMAL LOW (ref 8.9–10.3)
Chloride: 90 mmol/L — ABNORMAL LOW (ref 98–111)
Creatinine, Ser: 0.74 mg/dL (ref 0.61–1.24)
GFR calc Af Amer: >60 mL/min (ref >60)
GFR calc non Af Amer: >60 mL/min (ref >60)
Glucose, Bld: 99 mg/dL (ref 70–99)
Potassium: 3.5 mmol/L (ref 3.5–5.1)
Sodium: 131 mmol/L — ABNORMAL LOW (ref 135–145)

## 2019-10-27 LAB — PROTIME-INR
INR: 2.9 — ABNORMAL HIGH (ref 0.8–1.2)
Prothrombin Time: 29.2 seconds — ABNORMAL HIGH (ref 11.4–15.2)

## 2019-10-27 MED ORDER — ALPRAZOLAM 0.5 MG PO TABS
0.5000 mg | ORAL_TABLET | Freq: Two times a day (BID) | ORAL | Status: DC | PRN
  Administered 2019-10-27 – 2019-10-29 (×5): 0.5 mg via ORAL
  Filled 2019-10-27 (×5): qty 1

## 2019-10-27 MED ORDER — AMOXICILLIN-POT CLAVULANATE 875-125 MG PO TABS
1.0000 | ORAL_TABLET | Freq: Two times a day (BID) | ORAL | Status: DC
  Administered 2019-10-27 – 2019-10-29 (×4): 1 via ORAL
  Filled 2019-10-27 (×4): qty 1

## 2019-10-27 NOTE — Care Management Important Message (Signed)
Important Message  Patient Details  Name: Tyler [name] MRN: [id number] Date of Birth: 09/30/1955   Medicare Important Message Given:  Yes     Dannette Barbara 10/27/2019, 2:47 PM

## 2019-10-27 NOTE — TOC Progression Note (Addendum)
Transition of Care [facility]) - Progression Note    Patient Details  Name: Tyler [name] MRN: [id number] Date of Birth: 07/10/1955  [facility] CM/SW [place], RN Phone Number: 10/27/2019, 4:40 PM  Clinical Narrative:     Sliding board and wheelschair ordered for patient from [employer]       Expected Discharge Plan and Services                                                 Social Determinants of Health ([place]) Interventions    Readmission Risk Interventions No flowsheet data found.

## 2019-10-27 NOTE — Consult Note (Signed)
Pharmacy Consult for Warfarin Indication: atrial fibrillation      Allergies  Allergen Reactions  . Apixaban Rash  . Rivaroxaban Rash    Patient Measurements: Height: 6\' 1"  (185.4 cm) Weight: 73.9 kg (163 lb) IBW/kg (Calculated) : 79.9  Vital Signs: Temp: 98.9 F (37.2 C) (06/02 0648) Temp Source: Oral (06/02 0648) BP: 113/57 (06/02 1000) Pulse Rate: 75 (06/02 1000)  Labs: Recent Labs (last 2 labs)        Recent Labs    10/23/19 0503 10/24/19 0429 10/25/19 0646  HGB 7.7*  --  8.3*  HCT 22.5*  --  25.9*  PLT 279  --  404*  LABPROT 17.5* 18.3*  --   INR 1.5* 1.6*  --   CREATININE 0.74  --  0.75  TROPONINIHS  --   --  15      Estimated Creatinine Clearance: 97.5 mL/min (by C-G formula based on SCr of 0.75 mg/dL).   Medical History:     Past Medical History:  Diagnosis Date  . AICD (automatic cardioverter/defibrillator) present   . Anxiety   . Arthritis   . Atherosclerosis of artery of extremity with ulceration ([place]) 09/2019   left foot s/p toe amp requiring debridement and futher toe amputations.  . Atrial fibrillation ([place])   . Cervical spinal stenosis    with neuropathy  . CHF (congestive heart failure) ([place])   . Constipation   . COPD (chronic obstructive pulmonary disease) ([place])   . Coronary artery disease   . Depression   . Dyspnea   . Dysrhythmia    atrial fibrillation  . Emphysema of lung ([place])   . GERD (gastroesophageal reflux disease)   . Hypertension   . Lung nodule seen on imaging study    being followed by dr. [name]. just watching it for last few years, without change  . Myocardial infarction [facility]) 2004   stent placed, pacemaker implanted 2005  . Oxygen dependent    requires 2L nasal prong oxygen 24 hours a day  . Peripheral vascular disease ([place])   . Presence of permanent cardiac pacemaker [phone]    Medications:  (Not in a hospital admission)  Scheduled:  . furosemide  40 mg Intravenous Q12H   . ipratropium-albuterol  3 mL Nebulization Q4H  . methylPREDNISolone (SOLU-MEDROL) injection  40 mg Intravenous Q12H   Infusions:   PRN: acetaminophen, albuterol, dextromethorphan-guaiFENesin, hydrALAZINE, ondansetron (ZOFRAN) IV, oxyCODONE-acetaminophen    Anti-infectives (From admission, onward)     None        Assessment: Pharmacy consulted to monitor warfarin for afib. Patient states he takes warfarin 5 mg daily which was resumed on 6/2. Patient is on ciprofloxacin which can increase INR.  Date INR Warfarin Dose  6/1 1.6   6/2 1.8 5 mg  6/3 1.9 5mg   6/4 2.9        Goal of Therapy:  INR 2-3 Monitor platelets by anticoagulation protocol: Yes   Plan:  INR therapeutic. Due to large just in INR from 1.9 to 2.9 and patient taking Cipro, will hold warfarin dose tonight.    Daily INR ordered. CBC every 3 days at least.  Will monitor INR closely while pt is on abx.  Pernell Dupre, PharmD, BCPS Clinical Pharmacist 10/27/2019 7:20 AM

## 2019-10-27 NOTE — Progress Notes (Signed)
PROGRESS NOTE    Tyler [name]  QQP:[id number] DOB: Aug 27, 1955 DOA: 10/25/2019 PCP: [name], MD   Brief Narrative:  Tyler [name] is a 64 y.o. male with medical history significant of sCHF with EF of 30%, hypertension, COPD on 3 L oxygen, BPH, GERD, AICD placement, CAD, PVD, atrial fibrillation on Coumadin, factor V Leyden mutation, depression with anxiety, GERD, who presents with shortness breath.  Patient was recently hospitalized from 5/25-6/1 due to left lower extremity cellulitis with associated left foot osteomyelitis. 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 Wound cultures shows Pseudomonas resistant to Zosyn with moderateresistanceto cefepime. Patient was discharged onAugmentin and Cipro follow-up with Dr.Cline. Pt developed shortness breath since yesterday, which has been progressively worsening.  Patient has a dry cough, no chest pain, fever or chills.  Patient was initially treated with BiPAP and then transition to 6 L. BNP at 525.  Chest x-ray concerning for interstitial pulmonary edema.  Subjective: Patient was still complaining of some shortness of breath.  Was also worried about his transmetatarsal stump.  Assessment & Plan:   Principal Problem:   Acute on chronic respiratory failure with hypoxia (HCC) Active Problems:   Essential hypertension, benign   COPD with acute exacerbation (HCC)   Acute on chronic systolic (congestive) heart failure (HCC)   AF (paroxysmal atrial fibrillation) (HCC)   Coronary artery disease   Osteomyelitis of left leg (HCC)   BPH (benign prostatic hyperplasia)   Depression with anxiety  Acute on chronic respiratory failure with hypoxia [facility]):  Acute on chronic systolic (congestive) heart failure ([place]):  2D echo on 12/19/2018 showed EF of 30%.  Did receive quite a bit of fluid during recent hospitalization and surgery. Responded well to IV diuresis.  Saturating well on his home oxygen of 3L. -Continue IV  diuresis as patient still appears volume overload. -Continue with strict intake and output. -Daily BMP and weights. -Continue Entresto  COPD with acute exacerbation.  There was some concern of COPD exacerbation on admission.  Few scattered wheeze today. -Continue with prednisone. -Continue bronchodilators. -Continue supportive care.  Hypertension.  Blood pressure within normal limit. -Continue IV Lasix, Entresto and metoprolol.  AF (paroxysmal atrial fibrillation) (HCC) -Couamdin per pharm -metoprolol  Coronary artery disease: no CP -ASA and statin   Osteomyelitis of left leg [facility]): Recent transmetatarsal amputation. -continue Augmentin and Cipro.  Apparently Augmentin was dropped off during current hospitalization which was restarted today. - Dr. Vickki Muff saw him today, change dressing and put his recommendations for wound care.  He will follow him up in his clinic next week.  BPH (benign prostatic hyperplasia) -Flomax  Depression with anxiety: -Continue home Xanax, Celexa  GERD: -Protonix  Objective: Vitals:   10/27/19 0320 10/27/19 0416 10/27/19 0723 10/27/19 1219  BP:  (!) 113/46 121/63 112/62  Pulse:  74 80 79  Resp:  20 17 18   Temp:  98.3 F (36.8 C) 98.2 F (36.8 C) 97.8 F (36.6 C)  TempSrc:  Oral Oral   SpO2: 100% 100% 100% 98%  Weight:  81 kg    Height:        Intake/Output Summary (Last 24 hours) at 10/27/2019 1547 Last data filed at 10/27/2019 1337 Gross per 24 hour  Intake 240 ml  Output 2275 ml  Net -2035 ml   Filed Weights   10/26/19 0742 10/26/19 1139 10/27/19 0416  Weight: 73.9 kg 80.5 kg 81 kg    Examination:  General exam: Appears calm and comfortable  Respiratory system:  Few scattered wheeze bilaterally, respiratory effort normal. Cardiovascular system: S1 & S2 heard, RRR. No JVD, murmurs, rubs. Gastrointestinal system: Soft, nontender, nondistended, bowel sounds positive. Central nervous system: Alert and oriented. No focal  neurological deficits. Extremities: Right AKA, left TMA with Ace wrap, 2+ left lower extremity edema, 1+ edema involving both upper extremities. Psychiatry: Judgement and insight appear normal. Mood & affect appropriate.   DVT prophylaxis: Coumadin Code Status: Full  Family Communication: Discussed with patient. Disposition Plan:  Status is: Inpatient  Remains inpatient appropriate because:Inpatient level of care appropriate due to severity of illness   Dispo: The patient is from: Home              Anticipated d/c is to: Home              Anticipated d/c date is: 2 days              Patient currently is not medically stable to d/c.  Patient appears volume overload and will need continuation of IV diuresis for 1 to 2 days.   Consultants:   Podiatry  Procedures:  Antimicrobials:  Cipro Augmentin  Data Reviewed: I have personally reviewed following labs and imaging studies  CBC: Recent Labs  Lab 10/21/19 0443 10/22/19 0410 10/23/19 0503 10/25/19 0646  WBC 10.4 10.5 8.8 9.7  HGB 7.9* 7.6* 7.7* 8.3*  HCT 24.4* 23.5* 22.5* 25.9*  MCV 90.4 90.0 86.5 89.0  PLT 351 320 279 322*   Basic Metabolic Panel: Recent Labs  Lab 10/22/19 0410 10/23/19 0503 10/25/19 0646 10/26/19 0143 10/27/19 0541  NA 131* 129* 133* 132* 131*  K 4.4 4.2 4.8 4.0 3.5  CL 99 95* 94* 90* 90*  CO2 26 27 27 30 31   GLUCOSE 90 104* 117* 131* 99  BUN 9 8 5* 8 12  CREATININE 0.70 0.74 0.75 0.71 0.74  CALCIUM 8.2* 8.5* 8.8* 8.9 8.7*  MG  --   --   --  1.7  --    GFR: Estimated Creatinine Clearance: 105.4 mL/min (by C-G formula based on SCr of 0.74 mg/dL). Liver Function Tests: Recent Labs  Lab 10/25/19 0646  AST 28  ALT 19  ALKPHOS 57  BILITOT 0.6  PROT 6.3*  ALBUMIN 3.0*   No results for input(s): LIPASE, AMYLASE in the last 168 hours. No results for input(s): AMMONIA in the last 168 hours. Coagulation Profile: Recent Labs  Lab 10/23/19 0503 10/24/19 0429 10/25/19 1450  10/26/19 0143 10/27/19 0541  INR 1.5* 1.6* 1.8* 1.9* 2.9*   Cardiac Enzymes: No results for input(s): CKTOTAL, CKMB, CKMBINDEX, TROPONINI in the last 168 hours. BNP (last 3 results) No results for input(s): PROBNP in the last 8760 hours. HbA1C: No results for input(s): HGBA1C in the last 72 hours. CBG: No results for input(s): GLUCAP in the last 168 hours. Lipid Profile: No results for input(s): CHOL, HDL, LDLCALC, TRIG, CHOLHDL, LDLDIRECT in the last 72 hours. Thyroid Function Tests: No results for input(s): TSH, T4TOTAL, FREET4, T3FREE, THYROIDAB in the last 72 hours. Anemia Panel: No results for input(s): VITAMINB12, FOLATE, FERRITIN, TIBC, IRON, RETICCTPCT in the last 72 hours. Sepsis Labs: Recent Labs  Lab 10/26/19 1145  PROCALCITON <0.10    Recent Results (from the past 240 hour(s))  Culture, blood (Routine x 2)     Status: None   Collection Time: 10/17/19  5:20 PM   Specimen: BLOOD  Result Value Ref Range Status   Specimen Description BLOOD RIGHT ANTECUBITAL  Final   Special  Requests   Final    BOTTLES DRAWN AEROBIC AND ANAEROBIC Blood Culture adequate volume   Culture   Final    NO GROWTH 5 DAYS Performed at [facility], [address] [place], [place] [address]    Report Status 10/22/2019 FINAL  Final  Culture, blood (Routine x 2)     Status: None   Collection Time: 10/17/19  5:25 PM   Specimen: BLOOD LEFT HAND  Result Value Ref Range Status   Specimen Description BLOOD LEFT HAND  Final   Special Requests   Final    BOTTLES DRAWN AEROBIC AND ANAEROBIC Blood Culture adequate volume   Culture   Final    NO GROWTH 5 DAYS Performed at [facility], [address] [place], [place] [address]    Report Status 10/23/2019 FINAL  Final  Aerobic/Anaerobic Culture (surgical/deep wound)     Status: None   Collection Time: 10/18/19  1:05 PM   Specimen: Foot; Abscess  Result Value Ref Range Status   Specimen Description   Final    FOOT LEFT  FOOT Performed at [facility], [address] [place], [place] [address]    Special Requests   Final    NONE Performed at [facility], [address] [place], [place] [address]    Gram Stain   Final    ABUNDANT WBC PRESENT, PREDOMINANTLY PMN ABUNDANT GRAM POSITIVE COCCI ABUNDANT GRAM NEGATIVE RODS    Culture   Final    ABUNDANT PSEUDOMONAS AERUGINOSA FEW STAPHYLOCOCCUS AUREUS FEW GROUP B STREP(S.AGALACTIAE)ISOLATED TESTING AGAINST S. AGALACTIAE NOT ROUTINELY PERFORMED DUE TO PREDICTABILITY OF AMP/PEN/VAN SUSCEPTIBILITY. NO ANAEROBES ISOLATED Performed at [facility], [place] [address] [place], [place] [address]    Report Status 10/21/2019 FINAL  Final   Organism ID, Bacteria PSEUDOMONAS AERUGINOSA  Final   Organism ID, Bacteria STAPHYLOCOCCUS AUREUS  Final      Susceptibility   Pseudomonas aeruginosa - MIC*    CEFTAZIDIME 2 SENSITIVE Sensitive     CIPROFLOXACIN <=0.25 SENSITIVE Sensitive     GENTAMICIN <=1 SENSITIVE Sensitive     IMIPENEM 2 SENSITIVE Sensitive     PIP/TAZO 8 SENSITIVE Sensitive     CEFEPIME <=1 SENSITIVE Sensitive     * ABUNDANT PSEUDOMONAS AERUGINOSA   Staphylococcus aureus - MIC*    CIPROFLOXACIN <=0.5 SENSITIVE Sensitive     ERYTHROMYCIN >=8 RESISTANT Resistant     GENTAMICIN <=0.5 SENSITIVE Sensitive     OXACILLIN 0.5 SENSITIVE Sensitive     TETRACYCLINE <=1 SENSITIVE Sensitive     VANCOMYCIN 1 SENSITIVE Sensitive     TRIMETH/SULFA <=10 SENSITIVE Sensitive     CLINDAMYCIN <=0.25 SENSITIVE Sensitive     RIFAMPIN <=0.5 SENSITIVE Sensitive     Inducible Clindamycin NEGATIVE Sensitive     * FEW STAPHYLOCOCCUS AUREUS  SARS Coronavirus 2 by RT PCR (hospital order, performed in [place] hospital lab) Nasopharyngeal Nasopharyngeal Swab     Status: None   Collection Time: 10/18/19  3:54 PM   Specimen: Nasopharyngeal Swab  Result Value Ref Range Status   SARS Coronavirus 2 NEGATIVE NEGATIVE Final    Comment:  (NOTE) SARS-CoV-2 target nucleic acids are NOT DETECTED. The SARS-CoV-2 RNA is generally detectable in upper and lower respiratory specimens during the acute phase of infection. The lowest concentration of SARS-CoV-2 viral copies this assay can detect is 250 copies / mL. A negative result does not preclude SARS-CoV-2 infection and should not be used as the sole basis for  treatment or other patient management decisions.  A negative result may occur with improper specimen collection / handling, submission of specimen other than nasopharyngeal swab, presence of viral mutation(s) within the areas targeted by this assay, and inadequate number of viral copies (<250 copies / mL). A negative result must be combined with clinical observations, patient history, and epidemiological information. Fact Sheet for Patients:   StrictlyIdeas.no Fact Sheet for Healthcare Providers: BankingDealers.co.za This test is not yet approved or cleared  by the [place] FDA and has been authorized for detection and/or diagnosis of SARS-CoV-2 by FDA under an Emergency Use Authorization (EUA).  This EUA will remain in effect (meaning this test can be used) for the duration of the COVID-19 declaration under Section 564(b)(1) of the Act, 21 U.S.C. section 360bbb-3(b)(1), unless the authorization is terminated or revoked sooner. Performed at [facility], [address], [place], [place] [address]   Surgical pcr screen     Status: Abnormal   Collection Time: 10/20/19  2:18 AM   Specimen: Nasal Mucosa; Nasal Swab  Result Value Ref Range Status   MRSA, PCR NEGATIVE NEGATIVE Final   Staphylococcus aureus POSITIVE (A) NEGATIVE Final    Comment: (NOTE) The Xpert SA Assay (FDA approved for NASAL specimens in patients 52 years of age and older), is one component of a comprehensive surveillance program. It is not intended to diagnose infection nor to guide or  monitor treatment. Performed at [facility], [address], [place], [place] [address]   Aerobic/Anaerobic Culture (surgical/deep wound)     Status: None   Collection Time: 10/20/19 11:39 AM   Specimen: Wound  Result Value Ref Range Status   Specimen Description   Final    WOUND Performed at [facility], [address] [place],  [address]    Special Requests   Final    NONE Performed at [facility], [address] [place], [place] [address]    Gram Stain   Final    NO WBC SEEN ABUNDANT GRAM POSITIVE COCCI IN PAIRS    Culture   Final    ABUNDANT PSEUDOMONAS AERUGINOSA FEW STAPHYLOCOCCUS AUREUS ABUNDANT BACTEROIDES THETAIOTAOMICRON BETA LACTAMASE POSITIVE Performed at [facility], [place] [address] [place],  [address]    Report Status 10/23/2019 FINAL  Final   Organism ID, Bacteria PSEUDOMONAS AERUGINOSA  Final   Organism ID, Bacteria STAPHYLOCOCCUS AUREUS  Final      Susceptibility   Pseudomonas aeruginosa - MIC*    CEFTAZIDIME 16 INTERMEDIATE Intermediate     CIPROFLOXACIN <=0.25 SENSITIVE Sensitive     GENTAMICIN <=1 SENSITIVE Sensitive     IMIPENEM 2 SENSITIVE Sensitive     CEFEPIME INTERMEDIATE Intermediate     * ABUNDANT PSEUDOMONAS AERUGINOSA   Staphylococcus aureus - MIC*    CIPROFLOXACIN <=0.5 SENSITIVE Sensitive     ERYTHROMYCIN <=0.25 SENSITIVE Sensitive     GENTAMICIN <=0.5 SENSITIVE Sensitive     OXACILLIN <=0.25 SENSITIVE Sensitive     TETRACYCLINE >=16 RESISTANT Resistant     VANCOMYCIN 1 SENSITIVE Sensitive     TRIMETH/SULFA <=10 SENSITIVE Sensitive     CLINDAMYCIN <=0.25 SENSITIVE Sensitive     RIFAMPIN <=0.5 SENSITIVE Sensitive     Inducible Clindamycin NEGATIVE Sensitive     * FEW STAPHYLOCOCCUS AUREUS  SARS Coronavirus 2 by RT PCR (hospital order, performed in [place] hospital lab) Nasopharyngeal Nasopharyngeal Swab     Status: None   Collection Time: 10/25/19 10:29 AM  Specimen:  Nasopharyngeal Swab  Result Value Ref Range Status   SARS Coronavirus 2 NEGATIVE NEGATIVE Final    Comment: (NOTE) SARS-CoV-2 target nucleic acids are NOT DETECTED. The SARS-CoV-2 RNA is generally detectable in upper and lower respiratory specimens during the acute phase of infection. The lowest concentration of SARS-CoV-2 viral copies this assay can detect is 250 copies / mL. A negative result does not preclude SARS-CoV-2 infection and should not be used as the sole basis for treatment or other patient management decisions.  A negative result may occur with improper specimen collection / handling, submission of specimen other than nasopharyngeal swab, presence of viral mutation(s) within the areas targeted by this assay, and inadequate number of viral copies (<250 copies / mL). A negative result must be combined with clinical observations, patient history, and epidemiological information. Fact Sheet for Patients:   StrictlyIdeas.no Fact Sheet for Healthcare Providers: BankingDealers.co.za This test is not yet approved or cleared  by the [place] FDA and has been authorized for detection and/or diagnosis of SARS-CoV-2 by FDA under an Emergency Use Authorization (EUA).  This EUA will remain in effect (meaning this test can be used) for the duration of the COVID-19 declaration under Section 564(b)(1) of the Act, 21 U.S.C. section 360bbb-3(b)(1), unless the authorization is terminated or revoked sooner. Performed at [facility], [address] [place], [place] [address]   Culture, sputum-assessment     Status: None   Collection Time: 10/25/19  4:28 PM   Specimen: Expectorated Sputum  Result Value Ref Range Status   Specimen Description EXPECTORATED SPUTUM  Final   Special Requests NONE  Final   Sputum evaluation   Final    Sputum specimen not acceptable for testing.  Please recollect.   RESULT CALLED TO, READ BACK BY  AND VERIFIED WITH: JESSICA FULCHER 10/25/19 1654 KLW Performed at [facility], [address] [place], [place] [address]    Report Status 10/25/2019 FINAL  Final     Radiology Studies: CT ANGIO CHEST PE W OR WO CONTRAST  Result Date: 10/26/2019 CLINICAL DATA:  Show of breath EXAM: CT ANGIOGRAPHY CHEST WITH CONTRAST TECHNIQUE: Multidetector CT imaging of the chest was performed using the standard protocol during bolus administration of intravenous contrast. Multiplanar CT image reconstructions and MIPs were obtained to evaluate the vascular anatomy. CONTRAST:  80mL OMNIPAQUE IOHEXOL 350 MG/ML SOLN COMPARISON:  Chest CT May 05, 2019; chest radiograph October 25, 2019 FINDINGS: Cardiovascular: There is no demonstrable pulmonary embolus. There is no appreciable thoracic aortic aneurysm or dissection. There are scattered foci of calcification in visualized great vessels. There are foci of atherosclerotic calcification in the aorta. There are multiple foci of coronary artery calcification. No pericardial effusion or pericardial thickening is evident. There is a pacemaker present with lead tips attached to the right ventricle and coronary sinus. Mediastinum/Nodes: Thyroid appears normal. There is a lymph node to the left of the distal trachea measuring 1.2 x 1.0 cm, unchanged from previous study. Scattered subcentimeter lymph nodes elsewhere noted. No esophageal lesions are evident. Lungs/Pleura: There is centrilobular emphysematous change, stable. Scarring in the anterior right upper lobe near the apex is stable. There are pleural effusions bilaterally, larger on the right than on the left, with bibasilar compressive atelectasis. There may be superimposed pneumonia in these areas of atelectasis. Upper Abdomen: In the visualized upper abdomen, there is aortic atherosclerosis. There are calcified splenic granulomas. Visualized upper abdominal structures otherwise appear unremarkable. Musculoskeletal:  There is stable anterior wedging  of the T10 vertebral body. No blastic or lytic bone lesions. No evident chest wall lesions. Pacemaker device on the left anteriorly. Review of the MIP images confirms the above findings. IMPRESSION: 1. No demonstrable pulmonary embolus. No thoracic aortic aneurysm or dissection. There is aortic atherosclerosis as well as foci of great vessel and coronary artery calcification. Pacemaker leads attached to right ventricle and coronary sinus. 2. Underlying centrilobular emphysematous change. Pleural effusions bilaterally, larger on the right than on the left, with compressive atelectasis in both lung bases. A degree of superimposed pneumonia in these areas of atelectasis cannot be entirely excluded. 3. Mildly prominent left paratracheal lymph node, stable. No new lymph node prominence. 4.  Calcified splenic granulomas. Aortic Atherosclerosis (ICD10-I70.0) and Emphysema (ICD10-J43.9). Electronically Signed   By: Lowella Grip III M.D.   On: 10/26/2019 09:09   US Venous Img Lower Unilateral Left (DVT)  Result Date: 10/26/2019 CLINICAL DATA:  Left lower extremity edema EXAM: Left LOWER EXTREMITY VENOUS DOPPLER ULTRASOUND TECHNIQUE: Gray-scale sonography with graded compression, as well as color Doppler and duplex ultrasound were performed to evaluate the lower extremity deep venous systems from the level of the common femoral vein and including the common femoral, femoral, profunda femoral, popliteal and calf veins including the posterior tibial, peroneal and gastrocnemius veins when visible. The superficial great saphenous vein was also interrogated. Spectral Doppler was utilized to evaluate flow at rest and with distal augmentation maneuvers in the common femoral, femoral and popliteal veins. COMPARISON:  None. FINDINGS: Contralateral Common Femoral Vein: Respiratory phasicity is normal and symmetric with the symptomatic side. No evidence of thrombus. Normal compressibility. Common  Femoral Vein: No evidence of thrombus. Normal compressibility, respiratory phasicity and response to augmentation. Saphenofemoral Junction: No evidence of thrombus. Normal compressibility and flow on color Doppler imaging. Profunda Femoral Vein: No evidence of thrombus. Normal compressibility and flow on color Doppler imaging. Femoral Vein: No evidence of thrombus. Normal compressibility, respiratory phasicity and response to augmentation. Popliteal Vein: No evidence of thrombus. Normal compressibility, respiratory phasicity and response to augmentation. Calf Veins: No evidence of thrombus. Normal compressibility and flow on color Doppler imaging. Superficial Great Saphenous Vein: No evidence of thrombus. Normal compressibility. Venous Reflux:  None. Other Findings:  None. IMPRESSION: No evidence of deep venous thrombosis. Electronically Signed   By: Prudencio Pair M.D.   On: 10/26/2019 04:17   ECHOCARDIOGRAM COMPLETE  Result Date: 10/26/2019    ECHOCARDIOGRAM REPORT   Patient Name:   CRUZITO STANDRE Date of Exam: 10/25/2019 Medical Rec #:  [id number]         Height:       73.0 in Accession #:    [id number]        Weight:       163.0 lb Date of Birth:  16-Jun-1955          BSA:          1.972 m Patient Age:    65 years          BP:           132/65 mmHg Patient Gender: M                 HR:           78 bpm. Exam Location:  [facility] Procedure: 2D Echo, Cardiac Doppler and Color Doppler Indications:     W38.93 Acute Diastolic CHF  History:         Patient has no prior history of Echocardiogram examinations.  Defibrillator. Myocardial infarction. Peripheral vascular                  disease. Lung Emphysema. Dyspnea. Atrial Fibrillation. COPD.                  Coronary artery disease.  Sonographer:     Wilford Sports Rodgers-Jones Referring Phys:  [id number] Diagnosing Phys: Bartholome Bill MD IMPRESSIONS  1. Left ventricular ejection fraction, by estimation, is 55 to 60%. Left ventricular ejection fraction by  PLAX is 62 %. The left ventricle has normal function. The left ventricle demonstrates regional wall motion abnormalities (see scoring diagram/findings for description). Left ventricular diastolic parameters were normal.  2. Right ventricular systolic function is normal. The right ventricular size is normal.  3. Left atrial size was moderately dilated.  4. The mitral valve was not well visualized. Mild to moderate mitral valve regurgitation.  5. The aortic valve was not well visualized. Aortic valve regurgitation is trivial. FINDINGS  Left Ventricle: Left ventricular ejection fraction, by estimation, is 55 to 60%. Left ventricular ejection fraction by PLAX is 62 %. The left ventricle has normal function. The left ventricle demonstrates regional wall motion abnormalities. The left ventricular internal cavity size was normal in size. There is no left ventricular hypertrophy. Left ventricular diastolic parameters were normal.  LV Wall Scoring: The mid anteroseptal segment is hypokinetic. Right Ventricle: The right ventricular size is normal. No increase in right ventricular wall thickness. Right ventricular systolic function is normal. Left Atrium: Left atrial size was moderately dilated. Right Atrium: Right atrial size was normal in size. Pericardium: There is no evidence of pericardial effusion. Mitral Valve: The mitral valve was not well visualized. Mild to moderate mitral valve regurgitation. Tricuspid Valve: The tricuspid valve is grossly normal. Tricuspid valve regurgitation is mild. Aortic Valve: The aortic valve was not well visualized. Aortic valve regurgitation is trivial. Pulmonic Valve: The pulmonic valve was not well visualized. Pulmonic valve regurgitation is not visualized. Aorta: The aortic root was not well visualized. IAS/Shunts: The interatrial septum was not assessed. Additional Comments: A pacer wire is visualized.  LEFT VENTRICLE PLAX 2D LV EF:         Left            Diastology                 ventricular     LV e' lateral:   9.36 cm/s                ejection        LV E/e' lateral: 14.3                fraction by     LV e' medial:    11.70 cm/s                PLAX is 62      LV E/e' medial:  11.5                %. LVIDd:         7.26 cm LVIDs:         4.78 cm LV PW:         1.08 cm LV IVS:        0.74 cm  RIGHT VENTRICLE             IVC RV Basal diam:  4.04 cm     IVC diam: 2.27 cm RV S prime:     13.40  cm/s TAPSE (M-mode): 1.8 cm LEFT ATRIUM              Index       RIGHT ATRIUM           Index LA diam:        6.00 cm  3.04 cm/m  RA Area:     19.70 cm LA Vol (A2C):   108.0 ml 54.75 ml/m RA Volume:   57.20 ml  29.00 ml/m LA Vol (A4C):   93.0 ml  47.15 ml/m LA Biplane Vol: 105.0 ml 53.23 ml/m   AORTA Ao Root diam: 3.60 cm MV E velocity: 134.00 cm/s MV A velocity: 39.60 cm/s MV E/A ratio:  3.38 Bartholome Bill MD Electronically signed by Bartholome Bill MD Signature Date/Time: 10/26/2019/12:33:28 PM    Final     Scheduled Meds: . aspirin EC  81 mg Oral Daily  . ciprofloxacin  500 mg Oral BID  . citalopram  10 mg Oral Daily  . furosemide  40 mg Intravenous Q12H  . gabapentin  300 mg Oral TID  . ipratropium-albuterol  3 mL Nebulization Q4H  . loratadine  10 mg Oral QHS  . metoprolol succinate  25 mg Oral Daily  . mometasone-formoterol  2 puff Inhalation BID  . pantoprazole  40 mg Oral QAC breakfast  . pravastatin  20 mg Oral q1800  . predniSONE  40 mg Oral Q breakfast  . sacubitril-valsartan  1 tablet Oral BID  . sodium chloride flush  3 mL Intravenous Q12H  . tamsulosin  0.4 mg Oral QPC supper  . vitamin B-12  1,000 mcg Oral Daily  . Warfarin - Pharmacist Dosing Inpatient   Does not apply q1600   Continuous Infusions: . sodium chloride       LOS: 2 days   Time spent: 40 minutes.  Lorella Nimrod, MD [facility]  If 7PM-7AM, please contact night-coverage [email or web address]  10/27/2019, 3:47 PM   This record has been created using Systems analyst. Errors have  been sought and corrected,but may not always be located. Such creation errors do not reflect on the standard of care.

## 2019-10-27 NOTE — Progress Notes (Signed)
    Durable Medical Equipment  (From admission, onward)         Start     Ordered   10/27/19 1144  For home use only DME lightweight manual wheelchair with seat cushion  Once    Comments: Patient suffers from acute respiratory failure which impairs their ability to perform daily activities like mobility in the home.  A walker will not resolve  issue with performing activities of daily living. A wheelchair will allow patient to safely perform daily activities. Patient is not able to propel themselves in the home using a standard weight wheelchair due to weakness. Patient can self propel in the lightweight wheelchair. Accessories: elevating leg rests (ELRs), wheel locks, extensions and anti-tippers.   10/27/19 1146

## 2019-10-27 NOTE — Progress Notes (Signed)
Physical Therapy Treatment Patient Details Name: Tyler [name] MRN: [id number] DOB: May 19, 1956 Today's Date: 10/27/2019    History of Present Illness Per MD notes: Pt is a 64 y.o. male with medical history significant of sCHF with EF of 30%, HTN, COPD on 3 L oxygen, BPH, GERD, AICD placement, CAD, PVD, A-fib, factor V Leyden mutation, depression with anxiety, GERD, remote R AKA amputation, and recent L transmetatarsal ampuation 10/20/19 who presents with shortness breath.  MD assessment includes: Acute on chronic respiratory failure with hypoxia, acute on chronic CHF, COPD exacerbation, and osteomyelitis of left leg.    PT Comments    Pt pleasant and motivated to participate during the session.  Pt reported that he had a visit from MD who reported some bleeding to L foot and per pt MD stated ok to continue with transfers through heel only but to minimize use of the L foot.  Pt and spouse education provided with practice using sliding board to/from various surfaces without use of LLE.  Discussed strategies for use at home once pt receives new w/c.  Pt was able to use sliding board with assist for set-up and then SBA and cues for hand placement/general sequencing with good control and stability.  Pt will benefit from HHPT services upon discharge to safely address deficits listed in patient problem list for decreased caregiver assistance and eventual return to PLOF.     Follow Up Recommendations  Home health PT;Supervision - Intermittent     Equipment Recommendations  Wheelchair (measurements PT)    Recommendations for Other Services       Precautions / Restrictions Precautions Precautions: Fall Restrictions Weight Bearing Restrictions: Yes LLE Weight Bearing: Partial weight bearing LLE Partial Weight Bearing Percentage or Pounds: Per Dr. [name] pt may WB through the L heel for transfers only with post-op shoe donned.    Mobility  Bed Mobility Overal bed mobility: Modified  Independent             General bed mobility comments: Min extra time and effort  Transfers Overall transfer level: Needs assistance Equipment used: Sliding board Transfers: Lateral/Scoot Transfers          Lateral/Scoot Transfers: With slide board General transfer comment: Mod verbal and visual cues for proper sequencing to prevent use of L foot during transfers  Ambulation/Gait             General Gait Details: Pt non-ambulatory   Stairs             Wheelchair Mobility    Modified Rankin (Stroke Patients Only)       Balance Overall balance assessment: No apparent balance deficits (not formally assessed)   Sitting balance-Leahy Scale: Normal                                      Cognition Arousal/Alertness: Awake/alert Behavior During Therapy: WFL for tasks assessed/performed Overall Cognitive Status: Within Functional Limits for tasks assessed                                        Exercises Other Exercises Other Exercises: Pt and spouse education on sliding board use with various surfaces such as w/c and BSC and proper sequencing for safety and to prevent use of the L foot    General Comments  Pertinent Vitals/Pain Pain Assessment: 0-10 Pain Score: 5  Pain Location: L foot Pain Descriptors / Indicators: Sore Pain Intervention(s): Monitored during session;Premedicated before session    Home Living                      Prior Function            PT Goals (current goals can now be found in the care plan section) Progress towards PT goals: Progressing toward goals    Frequency    Min 2X/week      PT Plan Current plan remains appropriate    Co-evaluation              AM-PAC PT "6 Clicks" Mobility   Outcome Measure  Help needed turning from your back to your side while in a flat bed without using bedrails?: A Little Help needed moving from lying on your back to sitting on  the side of a flat bed without using bedrails?: A Little Help needed moving to and from a bed to a chair (including a wheelchair)?: A Little Help needed standing up from a chair using your arms (e.g., wheelchair or bedside chair)?: A Little Help needed to walk in hospital room?: Total Help needed climbing 3-5 steps with a railing? : Total 6 Click Score: 14    End of Session Equipment Utilized During Treatment: Gait belt;Oxygen Activity Tolerance: Patient tolerated treatment well Patient left: in bed;with family/visitor present;with bed alarm set Nurse Communication: Mobility status PT Visit Diagnosis: Muscle weakness (generalized) (M62.81)     Time: 8469-6295 PT Time Calculation (min) (ACUTE ONLY): 27 min  Charges:  $Therapeutic Activity: 23-37 mins                     D. Scott Absalom Aro PT, DPT 10/27/19, 4:16 PM

## 2019-10-27 NOTE — Progress Notes (Signed)
Dressing changed to TMA. Pain is better.   Some dusky tissue to plantar flap. Not necrotic at this time. Heel protector and new bandage applied.  Dressing orders written. F/u with Podiatry oupt next week.

## 2019-10-28 LAB — BASIC METABOLIC PANEL
Anion gap: 10 (ref 5–15)
BUN: 11 mg/dL (ref 8–23)
CO2: 32 mmol/L (ref 22–32)
Calcium: 8.8 mg/dL — ABNORMAL LOW (ref 8.9–10.3)
Chloride: 89 mmol/L — ABNORMAL LOW (ref 98–111)
Creatinine, Ser: 0.74 mg/dL (ref 0.61–1.24)
GFR calc Af Amer: >60 mL/min (ref >60)
GFR calc non Af Amer: >60 mL/min (ref >60)
Glucose, Bld: 86 mg/dL (ref 70–99)
Potassium: 3.6 mmol/L (ref 3.5–5.1)
Sodium: 131 mmol/L — ABNORMAL LOW (ref 135–145)

## 2019-10-28 LAB — CBC
HCT: 27 % — ABNORMAL LOW (ref 39.0–52.0)
Hemoglobin: 9 g/dL — ABNORMAL LOW (ref 13.0–17.0)
MCH: 28.6 pg (ref 26.0–34.0)
MCHC: 33.3 g/dL (ref 30.0–36.0)
MCV: 85.7 fL (ref 80.0–100.0)
Platelets: 429 10*3/uL — ABNORMAL HIGH (ref 150–400)
RBC: 3.15 MIL/uL — ABNORMAL LOW (ref 4.22–5.81)
RDW: 13.3 % (ref 11.5–15.5)
WBC: 10.8 10*3/uL — ABNORMAL HIGH (ref 4.0–10.5)
nRBC: 0 % (ref 0.0–0.2)

## 2019-10-28 LAB — PROTIME-INR
INR: 2.1 — ABNORMAL HIGH (ref 0.8–1.2)
Prothrombin Time: 22.8 seconds — ABNORMAL HIGH (ref 11.4–15.2)

## 2019-10-28 MED ORDER — WARFARIN SODIUM 5 MG PO TABS
5.0000 mg | ORAL_TABLET | Freq: Once | ORAL | Status: AC
  Administered 2019-10-28: 5 mg via ORAL
  Filled 2019-10-28: qty 1

## 2019-10-28 MED ORDER — IPRATROPIUM-ALBUTEROL 0.5-2.5 (3) MG/3ML IN SOLN
3.0000 mL | Freq: Three times a day (TID) | RESPIRATORY_TRACT | Status: DC
  Administered 2019-10-28: 3 mL via RESPIRATORY_TRACT
  Filled 2019-10-28: qty 3

## 2019-10-28 MED ORDER — IPRATROPIUM-ALBUTEROL 0.5-2.5 (3) MG/3ML IN SOLN
3.0000 mL | RESPIRATORY_TRACT | Status: DC | PRN
  Administered 2019-10-28 – 2019-10-29 (×2): 3 mL via RESPIRATORY_TRACT
  Filled 2019-10-28 (×2): qty 3

## 2019-10-28 NOTE — Progress Notes (Signed)
PROGRESS NOTE    Tyler [name]  NFA:[id number] DOB: July 02, 1955 DOA: 10/25/2019 PCP: [name], MD   Brief Narrative:  Tyler [name] is a 64 y.o. male with medical history significant of sCHF with EF of 30%, hypertension, COPD on 3 L oxygen, BPH, GERD, AICD placement, CAD, PVD, atrial fibrillation on Coumadin, factor V Leyden mutation, depression with anxiety, GERD, who presents with shortness breath.  Patient was recently hospitalized from 5/25-6/1 due to left lower extremity cellulitis with associated left foot osteomyelitis. 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 Wound cultures shows Pseudomonas resistant to Zosyn with moderateresistanceto cefepime. Patient was discharged onAugmentin and Cipro follow-up with Dr.Cline. Pt developed shortness breath since yesterday, which has been progressively worsening.  Patient has a dry cough, no chest pain, fever or chills.  Patient was initially treated with BiPAP and then transition to 6 L. BNP at 525.  Chest x-ray concerning for interstitial pulmonary edema.  Subjective: Patient has no new complaints today.  Continue to diurese well.  Assessment & Plan:   Principal Problem:   Acute on chronic respiratory failure with hypoxia (HCC) Active Problems:   Essential hypertension, benign   COPD with acute exacerbation (HCC)   Acute on chronic systolic (congestive) heart failure (HCC)   AF (paroxysmal atrial fibrillation) (HCC)   Coronary artery disease   Osteomyelitis of left leg (HCC)   BPH (benign prostatic hyperplasia)   Depression with anxiety  Acute on chronic respiratory failure with hypoxia [facility]):  Acute on chronic systolic (congestive) heart failure ([place]):  2D echo on 12/19/2018 showed EF of 30%.  Did receive quite a bit of fluid during recent hospitalization and surgery. Responded well to IV diuresis.  Saturating well on his home oxygen of 3L. -Continue IV diuresis as patient still appears volume  overload. -Continue with strict intake and output. -Daily BMP and weights. -Continue Entresto  COPD with acute exacerbation.  There was some concern of COPD exacerbation on admission.  Few scattered wheeze today. -Continue with prednisone for 1 more day. -Continue bronchodilators. -Continue supportive care.  Hypertension.  Blood pressure within normal limit. -Continue IV Lasix, Entresto and metoprolol.  AF (paroxysmal atrial fibrillation) (HCC) -Couamdin per pharm -metoprolol  Coronary artery disease: no CP -ASA and statin   Osteomyelitis of left leg [facility]): Recent transmetatarsal amputation. -continue Augmentin and Cipro.  Apparently Augmentin was dropped off during current hospitalization which was restarted today. - Dr. Vickki Muff saw him yesterday, change dressing and put his recommendations for wound care.  He will follow him up in his clinic next week.  BPH (benign prostatic hyperplasia) -Flomax  Depression with anxiety: -Continue home Xanax, Celexa  GERD: -Protonix  Objective: Vitals:   10/28/19 0608 10/28/19 0752 10/28/19 0756 10/28/19 1140  BP: (!) 115/49 (!) 102/59  123/66  Pulse: 78 76 80 75  Resp:  18 15 18   Temp: 98 F (36.7 C) 97.7 F (36.5 C)    TempSrc: Oral Oral    SpO2: 100% 100% 99% 100%  Weight: 78.3 kg     Height:        Intake/Output Summary (Last 24 hours) at 10/28/2019 1544 Last data filed at 10/28/2019 1206 Gross per 24 hour  Intake 240 ml  Output 4075 ml  Net -3835 ml   Filed Weights   10/26/19 1139 10/27/19 0416 10/28/19 0608  Weight: 80.5 kg 81 kg 78.3 kg    Examination:  General exam: Appears calm and comfortable  Respiratory system: Few scattered wheeze bilaterally,  respiratory effort normal. Cardiovascular system: S1 & S2 heard, RRR. No JVD, murmurs, rubs. Gastrointestinal system: Soft, nontender, nondistended, bowel sounds positive. Central nervous system: Alert and oriented. No focal neurological deficits. Extremities:  Right AKA, left TMA with Ace wrap, 2+ left lower extremity edema, 1+ edema involving both upper extremities. Psychiatry: Judgement and insight appear normal. Mood & affect appropriate.   DVT prophylaxis: Coumadin Code Status: Full  Family Communication: Discussed with patient. Disposition Plan:  Status is: Inpatient  Remains inpatient appropriate because:Inpatient level of care appropriate due to severity of illness   Dispo: The patient is from: Home              Anticipated d/c is to: Home              Anticipated d/c date is: 1 day.              Patient currently is not medically stable to d/c.  Patient appears volume overload and will need continuation of IV diuresis for 1 to 2 days.   Consultants:   Podiatry  Procedures:  Antimicrobials:  Cipro Augmentin  Data Reviewed: I have personally reviewed following labs and imaging studies  CBC: Recent Labs  Lab 10/22/19 0410 10/23/19 0503 10/25/19 0646 10/28/19 0619  WBC 10.5 8.8 9.7 10.8*  HGB 7.6* 7.7* 8.3* 9.0*  HCT 23.5* 22.5* 25.9* 27.0*  MCV 90.0 86.5 89.0 85.7  PLT 320 279 404* 097*   Basic Metabolic Panel: Recent Labs  Lab 10/23/19 0503 10/25/19 0646 10/26/19 0143 10/27/19 0541 10/28/19 0619  NA 129* 133* 132* 131* 131*  K 4.2 4.8 4.0 3.5 3.6  CL 95* 94* 90* 90* 89*  CO2 27 27 30 31  32  GLUCOSE 104* 117* 131* 99 86  BUN 8 5* 8 12 11   CREATININE 0.74 0.75 0.71 0.74 0.74  CALCIUM 8.5* 8.8* 8.9 8.7* 8.8*  MG  --   --  1.7  --   --    GFR: Estimated Creatinine Clearance: 103.3 mL/min (by C-G formula based on SCr of 0.74 mg/dL). Liver Function Tests: Recent Labs  Lab 10/25/19 0646  AST 28  ALT 19  ALKPHOS 57  BILITOT 0.6  PROT 6.3*  ALBUMIN 3.0*   No results for input(s): LIPASE, AMYLASE in the last 168 hours. No results for input(s): AMMONIA in the last 168 hours. Coagulation Profile: Recent Labs  Lab 10/24/19 0429 10/25/19 1450 10/26/19 0143 10/27/19 0541 10/28/19 0619  INR 1.6* 1.8*  1.9* 2.9* 2.1*   Cardiac Enzymes: No results for input(s): CKTOTAL, CKMB, CKMBINDEX, TROPONINI in the last 168 hours. BNP (last 3 results) No results for input(s): PROBNP in the last 8760 hours. HbA1C: No results for input(s): HGBA1C in the last 72 hours. CBG: No results for input(s): GLUCAP in the last 168 hours. Lipid Profile: No results for input(s): CHOL, HDL, LDLCALC, TRIG, CHOLHDL, LDLDIRECT in the last 72 hours. Thyroid Function Tests: No results for input(s): TSH, T4TOTAL, FREET4, T3FREE, THYROIDAB in the last 72 hours. Anemia Panel: No results for input(s): VITAMINB12, FOLATE, FERRITIN, TIBC, IRON, RETICCTPCT in the last 72 hours. Sepsis Labs: Recent Labs  Lab 10/26/19 1145  PROCALCITON <0.10    Recent Results (from the past 240 hour(s))  SARS Coronavirus 2 by RT PCR (hospital order, performed in [facility] hospital lab) Nasopharyngeal Nasopharyngeal Swab     Status: None   Collection Time: 10/18/19  3:54 PM   Specimen: Nasopharyngeal Swab  Result Value Ref Range Status   SARS  Coronavirus 2 NEGATIVE NEGATIVE Final    Comment: (NOTE) SARS-CoV-2 target nucleic acids are NOT DETECTED. The SARS-CoV-2 RNA is generally detectable in upper and lower respiratory specimens during the acute phase of infection. The lowest concentration of SARS-CoV-2 viral copies this assay can detect is 250 copies / mL. A negative result does not preclude SARS-CoV-2 infection and should not be used as the sole basis for treatment or other patient management decisions.  A negative result may occur with improper specimen collection / handling, submission of specimen other than nasopharyngeal swab, presence of viral mutation(s) within the areas targeted by this assay, and inadequate number of viral copies (<250 copies / mL). A negative result must be combined with clinical observations, patient history, and epidemiological information. Fact Sheet for Patients:     StrictlyIdeas.no Fact Sheet for Healthcare Providers: BankingDealers.co.za This test is not yet approved or cleared  by the [place] FDA and has been authorized for detection and/or diagnosis of SARS-CoV-2 by FDA under an Emergency Use Authorization (EUA).  This EUA will remain in effect (meaning this test can be used) for the duration of the COVID-19 declaration under Section 564(b)(1) of the Act, 21 U.S.C. section 360bbb-3(b)(1), unless the authorization is terminated or revoked sooner. Performed at [facility], [address], [place], [place] [address]   Surgical pcr screen     Status: Abnormal   Collection Time: 10/20/19  2:18 AM   Specimen: Nasal Mucosa; Nasal Swab  Result Value Ref Range Status   MRSA, PCR NEGATIVE NEGATIVE Final   Staphylococcus aureus POSITIVE (A) NEGATIVE Final    Comment: (NOTE) The Xpert SA Assay (FDA approved for NASAL specimens in patients 77 years of age and older), is one component of a comprehensive surveillance program. It is not intended to diagnose infection nor to guide or monitor treatment. Performed at [facility], [address], [place], [place] [address]   Aerobic/Anaerobic Culture (surgical/deep wound)     Status: None   Collection Time: 10/20/19 11:39 AM   Specimen: Wound  Result Value Ref Range Status   Specimen Description   Final    WOUND Performed at [facility], [address], [place], [place] [address]    Special Requests   Final    NONE Performed at [facility], [address] [place], [place] [address]    Gram Stain   Final    NO WBC SEEN ABUNDANT GRAM POSITIVE COCCI IN PAIRS    Culture   Final    ABUNDANT PSEUDOMONAS AERUGINOSA FEW STAPHYLOCOCCUS AUREUS ABUNDANT BACTEROIDES THETAIOTAOMICRON BETA LACTAMASE POSITIVE Performed at [facility], [place] [address], [place], [place] [address]    Report Status 10/23/2019  FINAL  Final   Organism ID, Bacteria PSEUDOMONAS AERUGINOSA  Final   Organism ID, Bacteria STAPHYLOCOCCUS AUREUS  Final      Susceptibility   Pseudomonas aeruginosa - MIC*    CEFTAZIDIME 16 INTERMEDIATE Intermediate     CIPROFLOXACIN <=0.25 SENSITIVE Sensitive     GENTAMICIN <=1 SENSITIVE Sensitive     IMIPENEM 2 SENSITIVE Sensitive     CEFEPIME INTERMEDIATE Intermediate     * ABUNDANT PSEUDOMONAS AERUGINOSA   Staphylococcus aureus - MIC*    CIPROFLOXACIN <=0.5 SENSITIVE Sensitive     ERYTHROMYCIN <=0.25 SENSITIVE Sensitive     GENTAMICIN <=0.5 SENSITIVE Sensitive     OXACILLIN <=0.25 SENSITIVE Sensitive     TETRACYCLINE >=16 RESISTANT Resistant     VANCOMYCIN 1 SENSITIVE Sensitive  TRIMETH/SULFA <=10 SENSITIVE Sensitive     CLINDAMYCIN <=0.25 SENSITIVE Sensitive     RIFAMPIN <=0.5 SENSITIVE Sensitive     Inducible Clindamycin NEGATIVE Sensitive     * FEW STAPHYLOCOCCUS AUREUS  SARS Coronavirus 2 by RT PCR (hospital order, performed in [place] hospital lab) Nasopharyngeal Nasopharyngeal Swab     Status: None   Collection Time: 10/25/19 10:29 AM   Specimen: Nasopharyngeal Swab  Result Value Ref Range Status   SARS Coronavirus 2 NEGATIVE NEGATIVE Final    Comment: (NOTE) SARS-CoV-2 target nucleic acids are NOT DETECTED. The SARS-CoV-2 RNA is generally detectable in upper and lower respiratory specimens during the acute phase of infection. The lowest concentration of SARS-CoV-2 viral copies this assay can detect is 250 copies / mL. A negative result does not preclude SARS-CoV-2 infection and should not be used as the sole basis for treatment or other patient management decisions.  A negative result may occur with improper specimen collection / handling, submission of specimen other than nasopharyngeal swab, presence of viral mutation(s) within the areas targeted by this assay, and inadequate number of viral copies (<250 copies / mL). A negative result must be combined with  clinical observations, patient history, and epidemiological information. Fact Sheet for Patients:   StrictlyIdeas.no Fact Sheet for Healthcare Providers: BankingDealers.co.za This test is not yet approved or cleared  by the [place] FDA and has been authorized for detection and/or diagnosis of SARS-CoV-2 by FDA under an Emergency Use Authorization (EUA).  This EUA will remain in effect (meaning this test can be used) for the duration of the COVID-19 declaration under Section 564(b)(1) of the Act, 21 U.S.C. section 360bbb-3(b)(1), unless the authorization is terminated or revoked sooner. Performed at [facility], [address] [place], [place] [address]   Culture, sputum-assessment     Status: None   Collection Time: 10/25/19  4:28 PM   Specimen: Expectorated Sputum  Result Value Ref Range Status   Specimen Description EXPECTORATED SPUTUM  Final   Special Requests NONE  Final   Sputum evaluation   Final    Sputum specimen not acceptable for testing.  Please recollect.   RESULT CALLED TO, READ BACK BY AND VERIFIED WITH: JESSICA FULCHER 10/25/19 1654 KLW Performed at [facility], [address] [place], [place] [address]    Report Status 10/25/2019 FINAL  Final     Radiology Studies: No results found.  Scheduled Meds: . amoxicillin-clavulanate  1 tablet Oral Q12H  . aspirin EC  81 mg Oral Daily  . ciprofloxacin  500 mg Oral BID  . citalopram  10 mg Oral Daily  . furosemide  40 mg Intravenous Q12H  . gabapentin  300 mg Oral TID  . loratadine  10 mg Oral QHS  . metoprolol succinate  25 mg Oral Daily  . mometasone-formoterol  2 puff Inhalation BID  . pantoprazole  40 mg Oral QAC breakfast  . pravastatin  20 mg Oral q1800  . predniSONE  40 mg Oral Q breakfast  . sacubitril-valsartan  1 tablet Oral BID  . sodium chloride flush  3 mL Intravenous Q12H  . tamsulosin  0.4 mg Oral QPC supper  . vitamin B-12   1,000 mcg Oral Daily  . Warfarin - Pharmacist Dosing Inpatient   Does not apply q1600   Continuous Infusions: . sodium chloride       LOS: 3 days   Time spent: 40 minutes.  Lorella Nimrod, MD [facility]  If 7PM-7AM, please contact  night-coverage [email or web address]  10/28/2019, 3:44 PM   This record has been created using Systems analyst. Errors have been sought and corrected,but may not always be located. Such creation errors do not reflect on the standard of care.

## 2019-10-28 NOTE — Consult Note (Signed)
Pharmacy Consult for Warfarin Indication: atrial fibrillation      Allergies  Allergen Reactions   Apixaban Rash   Rivaroxaban Rash    Patient Measurements: Height: 6\' 1"  (185.4 cm) Weight: 73.9 kg (163 lb) IBW/kg (Calculated) : 79.9  Vital Signs: Temp: 98.9 F (37.2 C) (06/02 0648) Temp Source: Oral (06/02 0648) BP: 113/57 (06/02 1000) Pulse Rate: 75 (06/02 1000)  Labs: Recent Labs (last 2 labs)        Recent Labs    10/23/19 0503 10/24/19 0429 10/25/19 0646  HGB 7.7*  --  8.3*  HCT 22.5*  --  25.9*  PLT 279  --  404*  LABPROT 17.5* 18.3*  --   INR 1.5* 1.6*  --   CREATININE 0.74  --  0.75  TROPONINIHS  --   --  15      Estimated Creatinine Clearance: 97.5 mL/min (by C-G formula based on SCr of 0.75 mg/dL).   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   Atrial fibrillation (HCC)    Cervical spinal stenosis    with neuropathy   CHF (congestive heart failure) (HCC)    Constipation    COPD (chronic obstructive pulmonary disease) (HCC)    Coronary artery disease    Depression    Dyspnea    Dysrhythmia    atrial fibrillation   Emphysema of lung (HCC)    GERD (gastroesophageal reflux disease)    Hypertension    Lung nodule seen on imaging study    being followed by dr. [name]. just watching it for last few years, without change   Myocardial infarction [facility]) 2004   stent placed, pacemaker implanted 2005   Oxygen dependent    requires 2L nasal prong oxygen 24 hours a day   Peripheral vascular disease ([place])    Presence of permanent cardiac pacemaker [phone]    Medications:  (Not in a hospital admission)  Scheduled:   furosemide  40 mg Intravenous Q12H    ipratropium-albuterol  3 mL Nebulization Q4H   methylPREDNISolone (SOLU-MEDROL) injection  40 mg Intravenous Q12H   Infusions:   PRN: acetaminophen, albuterol, dextromethorphan-guaiFENesin, hydrALAZINE, ondansetron (ZOFRAN) IV, oxyCODONE-acetaminophen    Anti-infectives (From admission, onward)     None        Assessment: Pharmacy consulted to monitor warfarin for afib. Patient states he takes warfarin 5 mg daily which was resumed on 6/2. Patient is on ciprofloxacin which can increase INR.  Date INR Warfarin Dose  6/1 1.6   6/2 1.8 5 mg  6/3 1.9 5mg   6/4 2.9 HELD  6/5 2.1    Goal of Therapy:  INR 2-3 Monitor platelets by anticoagulation protocol: Yes   Plan:  INR therapeutic. Will resume patients home dose of warfarin 5mg  tonight.   Daily INR ordered. CBC every 3 days at least.  Will monitor INR closely while pt is on abx.  Pernell Dupre, PharmD, BCPS Clinical Pharmacist 10/28/2019 9:53 AM

## 2019-10-29 LAB — BASIC METABOLIC PANEL
Anion gap: 12 (ref 5–15)
BUN: 10 mg/dL (ref 8–23)
CO2: 30 mmol/L (ref 22–32)
Calcium: 8.8 mg/dL — ABNORMAL LOW (ref 8.9–10.3)
Chloride: 88 mmol/L — ABNORMAL LOW (ref 98–111)
Creatinine, Ser: 0.75 mg/dL (ref 0.61–1.24)
GFR calc Af Amer: >60 mL/min (ref >60)
GFR calc non Af Amer: >60 mL/min (ref >60)
Glucose, Bld: 107 mg/dL — ABNORMAL HIGH (ref 70–99)
Potassium: 3.3 mmol/L — ABNORMAL LOW (ref 3.5–5.1)
Sodium: 130 mmol/L — ABNORMAL LOW (ref 135–145)

## 2019-10-29 LAB — PROTIME-INR
INR: 2.1 — ABNORMAL HIGH (ref 0.8–1.2)
Prothrombin Time: 22.5 seconds — ABNORMAL HIGH (ref 11.4–15.2)

## 2019-10-29 MED ORDER — POTASSIUM CHLORIDE CRYS ER 20 MEQ PO TBCR
40.0000 meq | EXTENDED_RELEASE_TABLET | Freq: Once | ORAL | Status: AC
  Administered 2019-10-29: 40 meq via ORAL
  Filled 2019-10-29: qty 2

## 2019-10-29 MED ORDER — WARFARIN SODIUM 5 MG PO TABS
5.0000 mg | ORAL_TABLET | Freq: Once | ORAL | Status: DC
  Filled 2019-10-29: qty 1

## 2019-10-29 MED ORDER — FUROSEMIDE 20 MG PO TABS: 40.0000 mg | ORAL_TABLET | Freq: Every day | ORAL | 0 refills | Status: DC

## 2019-10-29 NOTE — Discharge Summary (Signed)
Physician Discharge Summary  Tyler [name]:[id number] DOB: [birth year] DOA: 10/25/2019  PCP: [name], MD  Admit date: 10/25/2019 Discharge date: 10/29/2019  Admitted From: Home Disposition: Home   Recommendations for Outpatient Follow-up:  1. Follow up with PCP in 1-2 weeks 2. Please obtain BMP/CBC in one week 3. Please follow up on the following pending results:None  Home Health:Yes Equipment/Devices: Wheelchair, hospital bed, home oxygen Discharge Condition: Stable CODE STATUS: Full Diet recommendation: Heart Healthy / Carb Modified   Brief/Interim Summary: Tyler [name] a 64 y.o.malewith medical history significant of sCHF with EF of 30%, hypertension, COPD on 3 L oxygen, BPH, GERD, AICD placement, CAD, PVD, atrial fibrillation on Coumadin, factor V Leyden mutation, depression with anxiety, GERD, who presents with shortness breath.  Patient was recently hospitalized from 5/25-6/1 due to left lower extremity cellulitis with associated left foot osteomyelitis. 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 Wound cultures shows Pseudomonas resistant to Zosyn with moderateresistanceto cefepime. Patient was discharged onAugmentin and Cipro follow-up with Dr.Cline. Pt developed shortness breath since yesterday, which has been progressively worsening. Patient has a dry cough, no chest pain, fever or chills.  Patient was initially treated with BiPAP and then transition to 6 L, later weaned back to his home requirement of 3 L. BNP at 525.  Chest x-ray concerning for interstitial pulmonary edema. Patient did receive quite a bit of fluid during recent hospitalization with surgery.  He was diuresed with IV Lasix during hospitalization with decrease in his weight of about 10 pounds.  His weight on the day of discharge was 167 pound.  Significant improvement in his breathing and peripheral edema.  We will increase his home dose of Lasix from 20 mg to 40 mg daily  and he will follow up at heart failure clinic for further management.  There was some concern of COPD exacerbation due to wheezing.  He did receive prednisone during hospitalization and it was not continued on discharge.  Was seen by podiatry during current hospitalization and they will follow him up as an outpatient.  He will continue his home medications including antibiotics for recent osteomyelitis and transmetatarsal amputation.  Discharge Diagnoses:  Principal Problem:   Acute on chronic respiratory failure with hypoxia (HCC) Active Problems:   Essential hypertension, benign   COPD with acute exacerbation (HCC)   Acute on chronic systolic (congestive) heart failure (HCC)   AF (paroxysmal atrial fibrillation) (HCC)   Coronary artery disease   Osteomyelitis of left leg (HCC)   BPH (benign prostatic hyperplasia)   Depression with anxiety   Discharge Instructions  Discharge Instructions    Diet - low sodium heart healthy   Complete by: As directed    Discharge instructions   Complete by: As directed    It was pleasure taking care of you. I am increasing the dose of Lasix to 40 mg daily, keep taking that at higher dose for at least a week and follow-up with your cardiologist within that time for further management. Continue rest of your home medications as you were taking it before. Follow-up with your podiatrist within a week.   Discharge wound care:   Complete by: As directed    As above   Increase activity slowly   Complete by: As directed      Allergies as of 10/29/2019      Reactions   Apixaban Rash   Rivaroxaban Rash      Medication List    TAKE these medications  acetaminophen 500 MG tablet Commonly known as: TYLENOL Take 1,000 mg by mouth daily as needed for moderate pain or headache.   ALPRAZolam 1 MG tablet Commonly known as: XANAX Take 1 mg by mouth 2 (two) times daily as needed for anxiety or sleep.   amoxicillin-clavulanate 875-125 MG  tablet Commonly known as: Augmentin Take 1 tablet by mouth every 12 (twelve) hours for 10 days.   aspirin EC 81 MG tablet Take 1 tablet (81 mg total) by mouth daily. What changed: when to take this   ciprofloxacin 500 MG tablet Commonly known as: CIPRO Take 1 tablet (500 mg total) by mouth 2 (two) times daily for 10 days.   citalopram 10 MG tablet Commonly known as: CELEXA Take 10 mg by mouth daily.   Combivent Respimat 20-100 MCG/ACT Aers respimat Generic drug: Ipratropium-Albuterol Inhale 1 puff into the lungs every 4 (four) hours.   docusate sodium 100 MG capsule Commonly known as: COLACE Take 100 mg by mouth daily as needed (for constipation.).   Entresto 24-26 MG Generic drug: sacubitril-valsartan Take 1 tablet by mouth 2 (two) times daily.   fluticasone 50 MCG/ACT nasal spray Commonly known as: FLONASE Place 2 sprays into both nostrils daily.   fluticasone-salmeterol 115-21 MCG/ACT inhaler Commonly known as: Advair HFA USE 2 INHALATIONS ORALLY EVERY 12 HOURS What changed:   how much to take  how to take this  when to take this  additional instructions   Flutter Devi Use as directed.   furosemide 20 MG tablet Commonly known as: LASIX Take 2 tablets (40 mg total) by mouth daily. What changed: how much to take   gabapentin 100 MG capsule Commonly known as: NEURONTIN Take 300 mg by mouth in the morning, at noon, and at bedtime.   guaiFENesin 600 MG 12 hr tablet Commonly known as: MUCINEX Take 600 mg by mouth every evening.   ipratropium 0.03 % nasal spray Commonly known as: ATROVENT Place 2 sprays into both nostrils 2 (two) times daily.   levalbuterol 1.25 MG/3ML nebulizer solution Commonly known as: XOPENEX Take 1.25 mg (3 mLs total) by nebulization every 4 (four) hours. What changed:   when to take this  reasons to take this   loratadine 10 MG tablet Commonly known as: CLARITIN Take 10 mg by mouth at bedtime.   lovastatin 20 MG  tablet Commonly known as: MEVACOR Take 20 mg by mouth at bedtime.   metoprolol succinate 25 MG 24 hr tablet Commonly known as: TOPROL-XL Take 25 mg by mouth daily.   nitroGLYCERIN 0.4 MG SL tablet Commonly known as: NITROSTAT Place 0.4 mg under the tongue every 5 (five) minutes as needed for chest pain.   oxyCODONE-acetaminophen 7.5-325 MG tablet Commonly known as: PERCOCET Take 1 tablet by mouth every 6 (six) hours as needed for moderate pain.   pantoprazole 40 MG tablet Commonly known as: PROTONIX Take 40 mg by mouth daily before breakfast.   Restore Wound Care Dressing Pads Apply 1 each topically daily.   Spiriva Respimat 1.25 MCG/ACT Aers Generic drug: Tiotropium Bromide Monohydrate INHALE 2 PUFFS BY MOUTH INTO THE LUNGS DAILY What changed: See the new instructions.   tamsulosin 0.4 MG Caps capsule Commonly known as: FLOMAX Take 0.4 mg by mouth daily after supper.   vitamin B-12 1000 MCG tablet Commonly known as: CYANOCOBALAMIN Take 1,000 mcg by mouth daily.   warfarin 5 MG tablet Commonly known as: COUMADIN Take 5 mg by mouth daily.  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  A walker will not resolve as he is nonweightbearing on his only limb. issue with performing activities of daily living. A wheelchair will allow patient to safely perform daily activities. Patient is not able to propel themselves in the home using a standard weight wheelchair due to general weakness. Patient can self propel in the lightweight wheelchair. Length of need 6 months . Accessories: elevating leg rests (ELRs), wheel locks, extensions and anti-tippers.   10/27/19  1703   10/27/19 1702  For home use only DME Other see comment  Once    Comments: Sliding board  Question:  Length of Need  Answer:  6 Months   10/27/19 1703   10/27/19 1144  For home use only DME lightweight manual wheelchair with seat cushion  Once    Comments: Patient suffers from acute respiratory failure which impairs their ability to perform daily activities like mobility in the home.  A walker will not resolve  issue with performing activities of daily living. A wheelchair will allow patient to safely perform daily activities. Patient is not able to propel themselves in the home using a standard weight wheelchair due to weakness. Patient can self propel in the lightweight wheelchair. Accessories: elevating leg rests (ELRs), wheel locks, extensions and anti-tippers.   10/27/19 1146           Discharge Care Instructions  (From admission, onward)         Start     Ordered   10/29/19 0000  Discharge wound care:    Comments: As above   10/29/19 1058         Follow-up Information    [facility] Follow up on 11/02/2019.   Specialty: Cardiology Why: at 2:30pm. Enter through the [facility] entrance Contact information: [place] [phone]       Samara Deist, [place]. Schedule an appointment as soon as possible for a visit.   Specialty: Podiatry Contact information: [place] [address] [phone]        [name], MD. Schedule an appointment as soon as possible for a visit.   Specialty: Internal Medicine Contact information: [place] [address] [phone]          Allergies  Allergen Reactions  . Apixaban Rash  . Rivaroxaban Rash    Consultations:  Podiatry  Procedures/Studies: DG Chest 2 View  Result Date: 10/17/2019 CLINICAL DATA:  Suspected sepsis EXAM: CHEST - 2 VIEW COMPARISON:  06/12/2019  FINDINGS: Left-sided pacing device as before. No focal opacity, pleural effusion, or pneumothorax. Stable mild cardiomegaly with aortic atherosclerosis. No pneumothorax. Hyperinflation with emphysematous disease IMPRESSION: No active cardiopulmonary disease. Hyperinflation with emphysematous disease Electronically Signed   By: Donavan Foil M.D.   On: 10/17/2019 18:21   CT ANGIO CHEST PE W OR WO CONTRAST  Result Date: 10/26/2019 CLINICAL DATA:  Show of breath EXAM: CT ANGIOGRAPHY CHEST WITH CONTRAST TECHNIQUE: Multidetector CT imaging of the chest was performed using the standard protocol during bolus administration of intravenous  contrast. Multiplanar CT image reconstructions and MIPs were obtained to evaluate the vascular anatomy. CONTRAST:  65mL OMNIPAQUE IOHEXOL 350 MG/ML SOLN COMPARISON:  Chest CT May 05, 2019; chest radiograph October 25, 2019 FINDINGS: Cardiovascular: There is no demonstrable pulmonary embolus. There is no appreciable thoracic aortic aneurysm or dissection. There are scattered foci of calcification in visualized great vessels. There are foci of atherosclerotic calcification in the aorta. There are multiple foci of coronary artery calcification. No pericardial effusion or pericardial thickening is evident. There is a pacemaker present with lead tips attached to the right ventricle and coronary sinus. Mediastinum/Nodes: Thyroid appears normal. There is a lymph node to the left of the distal trachea measuring 1.2 x 1.0 cm, unchanged from previous study. Scattered subcentimeter lymph nodes elsewhere noted. No esophageal lesions are evident. Lungs/Pleura: There is centrilobular emphysematous change, stable. Scarring in the anterior right upper lobe near the apex is stable. There are pleural effusions bilaterally, larger on the right than on the left, with bibasilar compressive atelectasis. There may be superimposed pneumonia in these areas of atelectasis. Upper Abdomen: In the visualized upper  abdomen, there is aortic atherosclerosis. There are calcified splenic granulomas. Visualized upper abdominal structures otherwise appear unremarkable. Musculoskeletal: There is stable anterior wedging of the T10 vertebral body. No blastic or lytic bone lesions. No evident chest wall lesions. Pacemaker device on the left anteriorly. Review of the MIP images confirms the above findings. IMPRESSION: 1. No demonstrable pulmonary embolus. No thoracic aortic aneurysm or dissection. There is aortic atherosclerosis as well as foci of great vessel and coronary artery calcification. Pacemaker leads attached to right ventricle and coronary sinus. 2. Underlying centrilobular emphysematous change. Pleural effusions bilaterally, larger on the right than on the left, with compressive atelectasis in both lung bases. A degree of superimposed pneumonia in these areas of atelectasis cannot be entirely excluded. 3. Mildly prominent left paratracheal lymph node, stable. No new lymph node prominence. 4.  Calcified splenic granulomas. Aortic Atherosclerosis (ICD10-I70.0) and Emphysema (ICD10-J43.9). Electronically Signed   By: Lowella Grip III M.D.   On: 10/26/2019 09:09   PERIPHERAL VASCULAR CATHETERIZATION  Result Date: 10/05/2019 See op note  US Venous Img Lower Unilateral Left (DVT)  Result Date: 10/26/2019 CLINICAL DATA:  Left lower extremity edema EXAM: Left LOWER EXTREMITY VENOUS DOPPLER ULTRASOUND TECHNIQUE: Gray-scale sonography with graded compression, as well as color Doppler and duplex ultrasound were performed to evaluate the lower extremity deep venous systems from the level of the common femoral vein and including the common femoral, femoral, profunda femoral, popliteal and calf veins including the posterior tibial, peroneal and gastrocnemius veins when visible. The superficial great saphenous vein was also interrogated. Spectral Doppler was utilized to evaluate flow at rest and with distal augmentation  maneuvers in the common femoral, femoral and popliteal veins. COMPARISON:  None. FINDINGS: Contralateral Common Femoral Vein: Respiratory phasicity is normal and symmetric with the symptomatic side. No evidence of thrombus. Normal compressibility. Common Femoral Vein: No evidence of thrombus. Normal compressibility, respiratory phasicity and response to augmentation. Saphenofemoral Junction: No evidence of thrombus. Normal compressibility and flow on color Doppler imaging. Profunda Femoral Vein: No evidence of thrombus. Normal compressibility and flow on color Doppler imaging. Femoral Vein: No evidence of thrombus. Normal compressibility, respiratory phasicity and response to augmentation. Popliteal Vein: No evidence of thrombus. Normal compressibility, respiratory phasicity and response to augmentation. Calf Veins: No evidence of thrombus. Normal compressibility and flow on color Doppler imaging. Superficial Great Saphenous Vein: No evidence of thrombus. Normal compressibility.  Venous Reflux:  None. Other Findings:  None. IMPRESSION: No evidence of deep venous thrombosis. Electronically Signed   By: Prudencio Pair M.D.   On: 10/26/2019 04:17   US Venous Img Lower Unilateral Left  Addendum Date: 10/18/2019   ADDENDUM REPORT: 10/18/2019 01:53 ADDENDUM: These results were called by telephone at the time of interpretation on 10/18/2019 at 1:53 am to provider [facility] , who verbally acknowledged these results. Electronically Signed   By: Lovena Le M.D.   On: 10/18/2019 01:53   Result Date: 10/18/2019 CLINICAL DATA:  Pain and swelling the left leg, history of CHF EXAM: LEFT LOWER EXTREMITY VENOUS DOPPLER ULTRASOUND TECHNIQUE: Gray-scale sonography with compression, as well as color and duplex ultrasound, were performed to evaluate the deep venous system(s) from the level of the common femoral vein through the popliteal and proximal calf veins. COMPARISON:  None. FINDINGS: VENOUS There is hypoechoic  incompletely compressible, nonocclusive, peripherally marginated filling defects likely reflecting thrombus in the left common femoral, femoral and popliteal vein as well as the posterior tibial and peroneal veins of the calf. Normal direction of color venous flow. Normal preservation of the respiratory phasicity. Limited views of the contralateral common femoral vein are unremarkable. OTHER Likely surgical absence of the left greater saphenous vein. Limitations: none IMPRESSION: Incompletely compressible, nonocclusive thrombus seen throughout the deep venous system of the left lower extremity. A peripherally marginated hypoechoic appearance may suggest chronicity though acute thrombus is not excluded. Surgical absence of left greater saphenous vein. Currently attempting to contact the ordering provider with a critical value result. Addendum will be submitted upon case discussion. Chest Electronically Signed: By: Lovena Le M.D. On: 10/18/2019 01:46   DG Chest Portable 1 View  Result Date: 10/25/2019 CLINICAL DATA:  Shortness of breath EXAM: PORTABLE CHEST 1 VIEW COMPARISON:  10/21/2019 FINDINGS: Cardiomegaly with biventricular ICD/pacer. Mild interstitial coarsening with a few Kerley lines and trace right pleural effusion. Emphysema. No consolidation or pneumothorax. IMPRESSION: Mild interstitial opacity which could be related to patient's emphysema or early pulmonary edema. Electronically Signed   By: Monte Fantasia M.D.   On: 10/25/2019 07:50   DG Chest Port 1 View  Result Date: 10/21/2019 CLINICAL DATA:  Increased shortness of breath. EXAM: PORTABLE CHEST 1 VIEW COMPARISON:  Oct 17, 2019 FINDINGS: Stable pacemaker. Stable AICD device. The hila and mediastinum are unremarkable. No pneumothorax. No nodules or masses. There is a small right pleural effusion. Mild interstitial prominence centrally suggest pulmonary venous congestion. No overt edema. No focal infiltrate. No nodule or mass. IMPRESSION: 1.  Findings are most consistent with cardiomegaly, pulmonary venous congestion, and a small right pleural effusion. Electronically Signed   By: Dorise Bullion III M.D   On: 10/21/2019 13:36   DG Foot Complete Left  Result Date: 10/17/2019 CLINICAL DATA:  Left foot pain. Technologist notes state gangrenous foot. Concern for osteomyelitis. Two toes amputated 10 days ago. EXAM: LEFT FOOT - COMPLETE 3+ VIEW COMPARISON:  None. FINDINGS: Resection of the first, second, and third digit. The first, second, and third metatarsal heads are eroded. There is adjacent soft tissue edema and mottled gas in the soft tissues. No evidence of osteomyelitis of the in situ fourth and fifth digit. There is generalized soft tissue edema. Advanced vascular calcifications. IMPRESSION: 1. 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 2. Soft tissue edema with mottled gas in the soft tissues suspicious for soft tissue infection. 3. Generalized soft tissue edema. Advanced vascular calcifications. Electronically  Signed   By: Keith Rake M.D.   On: 10/17/2019 23:42   ECHOCARDIOGRAM COMPLETE  Result Date: 10/26/2019    ECHOCARDIOGRAM REPORT   Patient Name:   Tyler [name] Date of Exam: 10/25/2019 Medical Rec #:  [id number]         Height:       73.0 in Accession #:    [id number]        Weight:       163.0 lb Date of Birth:  July 18, 1955          BSA:          1.972 m Patient Age:    29 years          BP:           132/65 mmHg Patient Gender: M                 HR:           78 bpm. Exam Location:  [facility] Procedure: 2D Echo, Cardiac Doppler and Color Doppler Indications:     M76.72 Acute Diastolic CHF  History:         Patient has no prior history of Echocardiogram examinations.                  Defibrillator. Myocardial infarction. Peripheral vascular                  disease. Lung Emphysema. Dyspnea. Atrial Fibrillation. COPD.                  Coronary artery disease.  Sonographer:     Wilford Sports  Rodgers-Jones Referring Phys:  [id number] NIU Diagnosing Phys: Bartholome Bill MD IMPRESSIONS  1. Left ventricular ejection fraction, by estimation, is 55 to 60%. Left ventricular ejection fraction by PLAX is 62 %. The left ventricle has normal function. The left ventricle demonstrates regional wall motion abnormalities (see scoring diagram/findings for description). Left ventricular diastolic parameters were normal.  2. Right ventricular systolic function is normal. The right ventricular size is normal.  3. Left atrial size was moderately dilated.  4. The mitral valve was not well visualized. Mild to moderate mitral valve regurgitation.  5. The aortic valve was not well visualized. Aortic valve regurgitation is trivial. FINDINGS  Left Ventricle: Left ventricular ejection fraction, by estimation, is 55 to 60%. Left ventricular ejection fraction by PLAX is 62 %. The left ventricle has normal function. The left ventricle demonstrates regional wall motion abnormalities. The left ventricular internal cavity size was normal in size. There is no left ventricular hypertrophy. Left ventricular diastolic parameters were normal.  LV Wall Scoring: The mid anteroseptal segment is hypokinetic. Right Ventricle: The right ventricular size is normal. No increase in right ventricular wall thickness. Right ventricular systolic function is normal. Left Atrium: Left atrial size was moderately dilated. Right Atrium: Right atrial size was normal in size. Pericardium: There is no evidence of pericardial effusion. Mitral Valve: The mitral valve was not well visualized. Mild to moderate mitral valve regurgitation. Tricuspid Valve: The tricuspid valve is grossly normal. Tricuspid valve regurgitation is mild. Aortic Valve: The aortic valve was not well visualized. Aortic valve regurgitation is trivial. Pulmonic Valve: The pulmonic valve was not well visualized. Pulmonic valve regurgitation is not visualized. Aorta: The aortic root was not well  visualized. IAS/Shunts: The interatrial septum was not assessed. Additional Comments: A pacer wire is visualized.  LEFT VENTRICLE PLAX 2D LV EF:  Left            Diastology                ventricular     LV e' lateral:   9.36 cm/s                ejection        LV E/e' lateral: 14.3                fraction by     LV e' medial:    11.70 cm/s                PLAX is 62      LV E/e' medial:  11.5                %. LVIDd:         7.26 cm LVIDs:         4.78 cm LV PW:         1.08 cm LV IVS:        0.74 cm  RIGHT VENTRICLE             IVC RV Basal diam:  4.04 cm     IVC diam: 2.27 cm RV S prime:     13.40 cm/s TAPSE (M-mode): 1.8 cm LEFT ATRIUM              Index       RIGHT ATRIUM           Index LA diam:        6.00 cm  3.04 cm/m  RA Area:     19.70 cm LA Vol (A2C):   108.0 ml 54.75 ml/m RA Volume:   57.20 ml  29.00 ml/m LA Vol (A4C):   93.0 ml  47.15 ml/m LA Biplane Vol: 105.0 ml 53.23 ml/m   AORTA Ao Root diam: 3.60 cm MV E velocity: 134.00 cm/s MV A velocity: 39.60 cm/s MV E/A ratio:  3.38 Bartholome Bill MD Electronically signed by Bartholome Bill MD Signature Date/Time: 10/26/2019/12:33:28 PM    Final      Subjective: Patient was feeling better when seen today.  No new complaints.  Ready to go home.  Discharge Exam: Vitals:   10/29/19 0600 10/29/19 0726  BP: 124/70 (!) 112/48  Pulse:  75  Resp:  17  Temp:  98 F (36.7 C)  SpO2:  99%   Vitals:   10/28/19 2349 10/29/19 0510 10/29/19 0600 10/29/19 0726  BP:  (!) 145/128 124/70 (!) 112/48  Pulse: 81 78  75  Resp:  18  17  Temp:  97.9 F (36.6 C)  98 F (36.7 C)  TempSrc:  Oral  Oral  SpO2: 95% 100%  99%  Weight:  76.1 kg    Height:        General: Pt is alert, awake, not in acute distress Cardiovascular: RRR, S1/S2 +, no rubs, no gallops Respiratory: CTA bilaterally, no wheezing, no rhonchi Abdominal: Soft, NT, ND, bowel sounds + Extremities: Trace LLE edema, no cyanosis, right AKA   The results of significant diagnostics from  this hospitalization (including imaging, microbiology, ancillary and laboratory) are listed below for reference.    Microbiology: Recent Results (from the past 240 hour(s))  Surgical pcr screen     Status: Abnormal   Collection Time: 10/20/19  2:18 AM   Specimen: Nasal Mucosa; Nasal Swab  Result Value Ref Range Status   MRSA, PCR NEGATIVE NEGATIVE Final  Staphylococcus aureus POSITIVE (A) NEGATIVE Final    Comment: (NOTE) The Xpert SA Assay (FDA approved for NASAL specimens in patients 98 years of age and older), is one component of a comprehensive surveillance program. It is not intended to diagnose infection nor to guide or monitor treatment. Performed at [facility], [address], [place], [place] [address]   Aerobic/Anaerobic Culture (surgical/deep wound)     Status: None   Collection Time: 10/20/19 11:39 AM   Specimen: Wound  Result Value Ref Range Status   Specimen Description   Final    WOUND Performed at [facility], [address], [place], [place] [address]    Special Requests   Final    NONE Performed at [facility], [address] [place], [place] [address]    Gram Stain   Final    NO WBC SEEN ABUNDANT GRAM POSITIVE COCCI IN PAIRS    Culture   Final    ABUNDANT PSEUDOMONAS AERUGINOSA FEW STAPHYLOCOCCUS AUREUS ABUNDANT BACTEROIDES THETAIOTAOMICRON BETA LACTAMASE POSITIVE Performed at [facility], [place] [address] [place], [place] [address]    Report Status 10/23/2019 FINAL  Final   Organism ID, Bacteria PSEUDOMONAS AERUGINOSA  Final   Organism ID, Bacteria STAPHYLOCOCCUS AUREUS  Final      Susceptibility   Pseudomonas aeruginosa - MIC*    CEFTAZIDIME 16 INTERMEDIATE Intermediate     CIPROFLOXACIN <=0.25 SENSITIVE Sensitive     GENTAMICIN <=1 SENSITIVE Sensitive     IMIPENEM 2 SENSITIVE Sensitive     CEFEPIME INTERMEDIATE Intermediate     * ABUNDANT PSEUDOMONAS AERUGINOSA   Staphylococcus aureus - MIC*     CIPROFLOXACIN <=0.5 SENSITIVE Sensitive     ERYTHROMYCIN <=0.25 SENSITIVE Sensitive     GENTAMICIN <=0.5 SENSITIVE Sensitive     OXACILLIN <=0.25 SENSITIVE Sensitive     TETRACYCLINE >=16 RESISTANT Resistant     VANCOMYCIN 1 SENSITIVE Sensitive     TRIMETH/SULFA <=10 SENSITIVE Sensitive     CLINDAMYCIN <=0.25 SENSITIVE Sensitive     RIFAMPIN <=0.5 SENSITIVE Sensitive     Inducible Clindamycin NEGATIVE Sensitive     * FEW STAPHYLOCOCCUS AUREUS  SARS Coronavirus 2 by RT PCR (hospital order, performed in [place] hospital lab) Nasopharyngeal Nasopharyngeal Swab     Status: None   Collection Time: 10/25/19 10:29 AM   Specimen: Nasopharyngeal Swab  Result Value Ref Range Status   SARS Coronavirus 2 NEGATIVE NEGATIVE Final    Comment: (NOTE) SARS-CoV-2 target nucleic acids are NOT DETECTED. The SARS-CoV-2 RNA is generally detectable in upper and lower respiratory specimens during the acute phase of infection. The lowest concentration of SARS-CoV-2 viral copies this assay can detect is 250 copies / mL. A negative result does not preclude SARS-CoV-2 infection and should not be used as the sole basis for treatment or other patient management decisions.  A negative result may occur with improper specimen collection / handling, submission of specimen other than nasopharyngeal swab, presence of viral mutation(s) within the areas targeted by this assay, and inadequate number of viral copies (<250 copies / mL). A negative result must be combined with clinical observations, patient history, and epidemiological information. Fact Sheet for Patients:   StrictlyIdeas.no Fact Sheet for Healthcare Providers: BankingDealers.co.za This test is not yet approved or cleared  by the [place] FDA and has been authorized for detection and/or diagnosis of SARS-CoV-2 by FDA under an Emergency Use Authorization (EUA).  This EUA will remain in effect (meaning  this  test can be used) for the duration of the COVID-19 declaration under Section 564(b)(1) of the Act, 21 U.S.C. section 360bbb-3(b)(1), unless the authorization is terminated or revoked sooner. Performed at [facility] [address] [place], [place] [address]   Culture, sputum-assessment     Status: None   Collection Time: 10/25/19  4:28 PM   Specimen: Expectorated Sputum  Result Value Ref Range Status   Specimen Description EXPECTORATED SPUTUM  Final   Special Requests NONE  Final   Sputum evaluation   Final    Sputum specimen not acceptable for testing.  Please recollect.   RESULT CALLED TO, READ BACK BY AND VERIFIED WITH: JESSICA FULCHER 10/25/19 1654 KLW Performed at [facility], [address] [place], [place] [address]    Report Status 10/25/2019 FINAL  Final     Labs: BNP (last 3 results) Recent Labs    10/25/19 0646  BNP 532.9*   Basic Metabolic Panel: Recent Labs  Lab 10/25/19 0646 10/26/19 0143 10/27/19 0541 10/28/19 0619 10/29/19 0708  NA 133* 132* 131* 131* 130*  K 4.8 4.0 3.5 3.6 3.3*  CL 94* 90* 90* 89* 88*  CO2 27 30 31  32 30  GLUCOSE 117* 131* 99 86 107*  BUN 5* 8 12 11 10   CREATININE 0.75 0.71 0.74 0.74 0.75  CALCIUM 8.8* 8.9 8.7* 8.8* 8.8*  MG  --  1.7  --   --   --    Liver Function Tests: Recent Labs  Lab 10/25/19 0646  AST 28  ALT 19  ALKPHOS 57  BILITOT 0.6  PROT 6.3*  ALBUMIN 3.0*   No results for input(s): LIPASE, AMYLASE in the last 168 hours. No results for input(s): AMMONIA in the last 168 hours. CBC: Recent Labs  Lab 10/23/19 0503 10/25/19 0646 10/28/19 0619  WBC 8.8 9.7 10.8*  HGB 7.7* 8.3* 9.0*  HCT 22.5* 25.9* 27.0*  MCV 86.5 89.0 85.7  PLT 279 404* 429*   Cardiac Enzymes: No results for input(s): CKTOTAL, CKMB, CKMBINDEX, TROPONINI in the last 168 hours. BNP: Invalid input(s): POCBNP CBG: No results for input(s): GLUCAP in the last 168 hours. D-Dimer No results for input(s): DDIMER  in the last 72 hours. Hgb A1c No results for input(s): HGBA1C in the last 72 hours. Lipid Profile No results for input(s): CHOL, HDL, LDLCALC, TRIG, CHOLHDL, LDLDIRECT in the last 72 hours. Thyroid function studies No results for input(s): TSH, T4TOTAL, T3FREE, THYROIDAB in the last 72 hours.  Invalid input(s): FREET3 Anemia work up No results for input(s): VITAMINB12, FOLATE, FERRITIN, TIBC, IRON, RETICCTPCT in the last 72 hours. Urinalysis    Component Value Date/Time   COLORURINE YELLOW (A) 10/18/2019 0630   APPEARANCEUR CLEAR (A) 10/18/2019 0630   LABSPEC 1.006 10/18/2019 0630   PHURINE 6.0 10/18/2019 0630   GLUCOSEU NEGATIVE 10/18/2019 0630   HGBUR SMALL (A) 10/18/2019 0630   BILIRUBINUR NEGATIVE 10/18/2019 0630   KETONESUR NEGATIVE 10/18/2019 0630   PROTEINUR NEGATIVE 10/18/2019 0630   NITRITE NEGATIVE 10/18/2019 0630   LEUKOCYTESUR NEGATIVE 10/18/2019 0630   Sepsis Labs Invalid input(s): PROCALCITONIN,  WBC,  LACTICIDVEN Microbiology Recent Results (from the past 240 hour(s))  Surgical pcr screen     Status: Abnormal   Collection Time: 10/20/19  2:18 AM   Specimen: Nasal Mucosa; Nasal Swab  Result Value Ref Range Status   MRSA, PCR NEGATIVE NEGATIVE Final   Staphylococcus aureus POSITIVE (A) NEGATIVE Final    Comment: (NOTE) The Xpert SA Assay (FDA  approved for NASAL specimens in patients 47 years of age and older), is one component of a comprehensive surveillance program. It is not intended to diagnose infection nor to guide or monitor treatment. Performed at [facility], [address] [place], [place] [address]   Aerobic/Anaerobic Culture (surgical/deep wound)     Status: None   Collection Time: 10/20/19 11:39 AM   Specimen: Wound  Result Value Ref Range Status   Specimen Description   Final    WOUND Performed at [facility], [address], [place], [place] [address]    Special Requests   Final    NONE Performed at [facility], [address] [place], [place] [address]    Gram Stain   Final    NO WBC SEEN ABUNDANT GRAM POSITIVE COCCI IN PAIRS    Culture   Final    ABUNDANT PSEUDOMONAS AERUGINOSA FEW STAPHYLOCOCCUS AUREUS ABUNDANT BACTEROIDES THETAIOTAOMICRON BETA LACTAMASE POSITIVE Performed at [facility], [place] [address], [place], [place] [address]    Report Status 10/23/2019 FINAL  Final   Organism ID, Bacteria PSEUDOMONAS AERUGINOSA  Final   Organism ID, Bacteria STAPHYLOCOCCUS AUREUS  Final      Susceptibility   Pseudomonas aeruginosa - MIC*    CEFTAZIDIME 16 INTERMEDIATE Intermediate     CIPROFLOXACIN <=0.25 SENSITIVE Sensitive     GENTAMICIN <=1 SENSITIVE Sensitive     IMIPENEM 2 SENSITIVE Sensitive     CEFEPIME INTERMEDIATE Intermediate     * ABUNDANT PSEUDOMONAS AERUGINOSA   Staphylococcus aureus - MIC*    CIPROFLOXACIN <=0.5 SENSITIVE Sensitive     ERYTHROMYCIN <=0.25 SENSITIVE Sensitive     GENTAMICIN <=0.5 SENSITIVE Sensitive     OXACILLIN <=0.25 SENSITIVE Sensitive     TETRACYCLINE >=16 RESISTANT Resistant     VANCOMYCIN 1 SENSITIVE Sensitive     TRIMETH/SULFA <=10 SENSITIVE Sensitive     CLINDAMYCIN <=0.25 SENSITIVE Sensitive     RIFAMPIN <=0.5 SENSITIVE Sensitive     Inducible Clindamycin NEGATIVE Sensitive     * FEW STAPHYLOCOCCUS AUREUS  SARS Coronavirus 2 by RT PCR (hospital order, performed in [place] hospital lab) Nasopharyngeal Nasopharyngeal Swab     Status: None   Collection Time: 10/25/19 10:29 AM   Specimen: Nasopharyngeal Swab  Result Value Ref Range Status   SARS Coronavirus 2 NEGATIVE NEGATIVE Final    Comment: (NOTE) SARS-CoV-2 target nucleic acids are NOT DETECTED. The SARS-CoV-2 RNA is generally detectable in upper and lower respiratory specimens during the acute phase of infection. The lowest concentration of SARS-CoV-2 viral copies this assay can detect is 250 copies / mL. A negative result does not preclude SARS-CoV-2 infection and  should not be used as the sole basis for treatment or other patient management decisions.  A negative result may occur with improper specimen collection / handling, submission of specimen other than nasopharyngeal swab, presence of viral mutation(s) within the areas targeted by this assay, and inadequate number of viral copies (<250 copies / mL). A negative result must be combined with clinical observations, patient history, and epidemiological information. Fact Sheet for Patients:   StrictlyIdeas.no Fact Sheet for Healthcare Providers: BankingDealers.co.za This test is not yet approved or cleared  by the [place] FDA and has been authorized for detection and/or diagnosis of SARS-CoV-2 by FDA under an Emergency Use Authorization (EUA).  This EUA will remain in effect (meaning this test can be used) for the duration of the COVID-19 declaration under Section 564(b)(1) of the  Act, 21 U.S.C. section 360bbb-3(b)(1), unless the authorization is terminated or revoked sooner. Performed at [facility], [address] [place], [place] [address]   Culture, sputum-assessment     Status: None   Collection Time: 10/25/19  4:28 PM   Specimen: Expectorated Sputum  Result Value Ref Range Status   Specimen Description EXPECTORATED SPUTUM  Final   Special Requests NONE  Final   Sputum evaluation   Final    Sputum specimen not acceptable for testing.  Please recollect.   RESULT CALLED TO, READ BACK BY AND VERIFIED WITH: JESSICA FULCHER 10/25/19 1654 KLW Performed at [facility], [address] [place], [place] [address]    Report Status 10/25/2019 FINAL  Final    Time coordinating discharge: Over 30 minutes  SIGNED:  Lorella Nimrod, MD  [facility] 10/29/2019, 10:59 AM  If 7PM-7AM, please contact night-coverage [email or web address]  This record has been created using Systems analyst. Errors have been  sought and corrected,but may not always be located. Such creation errors do not reflect on the standard of care.

## 2019-10-29 NOTE — TOC Transition Note (Signed)
Transition of Care [facility]) - CM/SW Discharge Note   Patient Details  Name: Tyler [name] MRN: [id number] Date of Birth: 1956/04/28  [facility] CM/SW Contact:  Amador Cunas, [place] Phone Number: 10/29/2019, 3:22 PM   Clinical Narrative:  Pt for dc home with his wife and HH today. Notified Kendra with [facility] of dc. Spoke to DeForest with [employer] who reports wheelchair and slide board will be delivered to pt's home tomorrow. Updated pt who verbalized understanding. SW signing off at dc.   Wandra Feinstein, MSW, LCSW [phone] (coverage)        Final next level of care: [place] Barriers to Discharge: No Barriers Identified   Patient Goals and CMS Choice        Discharge Placement                    Patient and family notified of of transfer: 10/29/19  Discharge Plan and Services                DME Arranged: Wheelchair manual, Other see comment(Slide board) DME Agency: [employer] Date DME Agency Contacted: 10/29/19 Time DME Agency Contacted: 1500 Representative spoke with at DME Agency: [place]: PT, OT, RN, NA [place] Agency: [facility] Date [place]: 10/29/19 Time [place]: 1200 Representative spoke with at [place]: [place] ([place]) Interventions     Readmission Risk Interventions No flowsheet data found.

## 2019-10-29 NOTE — Consult Note (Addendum)
Pharmacy Consult for Warfarin Indication: atrial fibrillation      Allergies  Allergen Reactions  . Apixaban Rash  . Rivaroxaban Rash    Patient Measurements: Height: 6\' 1"  (185.4 cm) Weight: 73.9 kg (163 lb) IBW/kg (Calculated) : 79.9  Vital Signs: Temp: 98.9 F (37.2 C) (06/02 0648) Temp Source: Oral (06/02 0648) BP: 113/57 (06/02 1000) Pulse Rate: 75 (06/02 1000)  Labs: Recent Labs (last 2 labs)        Recent Labs    10/23/19 0503 10/24/19 0429 10/25/19 0646  HGB 7.7*  --  8.3*  HCT 22.5*  --  25.9*  PLT 279  --  404*  LABPROT 17.5* 18.3*  --   INR 1.5* 1.6*  --   CREATININE 0.74  --  0.75  TROPONINIHS  --   --  15      Estimated Creatinine Clearance: 97.5 mL/min (by C-G formula based on SCr of 0.75 mg/dL).   Medical History:     Past Medical History:  Diagnosis Date  . AICD (automatic cardioverter/defibrillator) present   . Anxiety   . Arthritis   . Atherosclerosis of artery of extremity with ulceration ([place]) 09/2019   left foot s/p toe amp requiring debridement and futher toe amputations.  . Atrial fibrillation ([place])   . Cervical spinal stenosis    with neuropathy  . CHF (congestive heart failure) ([place])   . Constipation   . COPD (chronic obstructive pulmonary disease) ([place])   . Coronary artery disease   . Depression   . Dyspnea   . Dysrhythmia    atrial fibrillation  . Emphysema of lung ([place])   . GERD (gastroesophageal reflux disease)   . Hypertension   . Lung nodule seen on imaging study    being followed by dr. [name]. just watching it for last few years, without change  . Myocardial infarction [facility]) 2004   stent placed, pacemaker implanted 2005  . Oxygen dependent    requires 2L nasal prong oxygen 24 hours a day  . Peripheral vascular disease ([place])   . Presence of permanent cardiac pacemaker [phone]    Medications:  (Not in a hospital admission)  Scheduled:  . furosemide  40 mg Intravenous Q12H   . ipratropium-albuterol  3 mL Nebulization Q4H  . methylPREDNISolone (SOLU-MEDROL) injection  40 mg Intravenous Q12H   Infusions:   PRN: acetaminophen, albuterol, dextromethorphan-guaiFENesin, hydrALAZINE, ondansetron (ZOFRAN) IV, oxyCODONE-acetaminophen    Anti-infectives (From admission, onward)     None        Assessment: Pharmacy consulted to monitor warfarin for afib. Patient states he takes warfarin 5 mg daily which was resumed on 6/2. Patient is on ciprofloxacin and augmenting which can increase INR.  Date INR Warfarin Dose  6/1 1.6   6/2 1.8 5 mg  6/3 1.9 5mg   6/4 2.9 HELD  6/5 2.1 5mg   6/6 2.1        Goal of Therapy:  INR 2-3 Monitor platelets by anticoagulation protocol: Yes   Plan:  INR therapeutic. Will resume patients home dose of warfarin 5mg  tonight.   Daily INR ordered. CBC every 3 days at least.   Will monitor INR closely while pt is on abx.  Pernell Dupre, PharmD, BCPS Clinical Pharmacist 10/29/2019 9:36 AM

## 2019-10-30 ENCOUNTER — Telehealth: Payer: Self-pay

## 2019-10-30 ENCOUNTER — Other Ambulatory Visit: Payer: Self-pay

## 2019-10-30 DIAGNOSIS — J441 Chronic obstructive pulmonary disease with (acute) exacerbation: Secondary | ICD-10-CM

## 2019-10-30 NOTE — Telephone Encounter (Signed)
Phone call placed to patient to check in after recent hospitalization.Denies any questions or concerns s/p hospital.  Patient noted that home health is visiting tomorrow and would like to wait until after their visit is completed before scheduling a follow up with Palliative NP.

## 2019-10-30 NOTE — Patient Outreach (Signed)
[place] [facility]) Care Management  10/30/2019  Tyler [name] 1956/03/09 [id number]   Referral Date: 10/30/19 Referral Source:  Hospital liaison Date of Admission: 10/25/19 Diagnosis:  Heart Failure Date of Discharge: 10/29/19 Facility: [place]  Outreach attempt: Telephone call to patient for screening call following hospitalization.  Patient reports she is doing pretty good.  Discussed recent hospitalization.  He states he received too much fluid during recent amputation surgery.  He states the next day he was short of breath and he went to the hospital. He states he lost about 10 lbs of fluid in the hospital and his lasix was increased.  He states he has follow ups with heart failure clinic, PCP, and surgeon who did his recent left transmetatarsal amputation. He denies problems with transportation.  He states that home health is scheduled to come tomorrow.  He also states that palliative care called today as well but will wait on them until home health visits.    Discussed heart failure, heart failure maintenance, and signs.  Patient verbalized understanding but cannot weigh right now as he cannot be on that left leg and he does not have his right leg due to PVD.  Discussed [facility] care management follow up and support.  However, patient declined at this time.  Patient is willing however to receive letter and brochure for future reference.     Plan: RN CM will send letter and close case at this time.     [name], RN, MSN [facility] Care Management Care Management Coordinator Direct Line [phone] Toll Free: [phone]  Fax: [phone]

## 2019-10-31 DIAGNOSIS — I5022 Chronic systolic (congestive) heart failure: Secondary | ICD-10-CM | POA: Diagnosis not present

## 2019-10-31 DIAGNOSIS — J439 Emphysema, unspecified: Secondary | ICD-10-CM | POA: Diagnosis not present

## 2019-10-31 DIAGNOSIS — Z4801 Encounter for change or removal of surgical wound dressing: Secondary | ICD-10-CM | POA: Diagnosis not present

## 2019-10-31 DIAGNOSIS — I110 Hypertensive heart disease with heart failure: Secondary | ICD-10-CM | POA: Diagnosis not present

## 2019-10-31 DIAGNOSIS — B965 Pseudomonas (aeruginosa) (mallei) (pseudomallei) as the cause of diseases classified elsewhere: Secondary | ICD-10-CM | POA: Diagnosis not present

## 2019-10-31 DIAGNOSIS — J441 Chronic obstructive pulmonary disease with (acute) exacerbation: Secondary | ICD-10-CM | POA: Diagnosis not present

## 2019-10-31 DIAGNOSIS — M5000 Cervical disc disorder with myelopathy, unspecified cervical region: Secondary | ICD-10-CM | POA: Diagnosis not present

## 2019-10-31 DIAGNOSIS — I70202 Unspecified atherosclerosis of native arteries of extremities, left leg: Secondary | ICD-10-CM | POA: Diagnosis not present

## 2019-10-31 DIAGNOSIS — L03116 Cellulitis of left lower limb: Secondary | ICD-10-CM | POA: Diagnosis not present

## 2019-10-31 DIAGNOSIS — M869 Osteomyelitis, unspecified: Secondary | ICD-10-CM | POA: Diagnosis not present

## 2019-10-31 DIAGNOSIS — Z4781 Encounter for orthopedic aftercare following surgical amputation: Secondary | ICD-10-CM | POA: Diagnosis not present

## 2019-11-01 NOTE — Progress Notes (Signed)
Patient ID: Tyler [name], male    DOB: 1955/12/10, 64 y.o.   MRN: [id number]  HPI  Tyler [name] is a 64 y/o male with a history of CAD, HTN, PVD, atrial fibrillation, depression, GERD, COPD, anxiety, previous tobacco use and chronic heart failure.   Echo report from 10/25/19 reviewed and showed an EF of 55-60% along with moderate LAE and mild/moderate Tyler.   Admitted 10/25/19 due to shortness of breath. Initially needed bipap and then weaned down to home oxygen of 3L. Wound consult obtained. Initially given IV lasix with transition to oral diuretics. Continues on antibiotics due to recent osteomyelitis. Discharged after 4 days. Admitted 10/17/19 due to acute onset of worsening left foot swelling with erythema and discharge. Surgical consult obtained as he has had recent toe amputations. Underwent transmetatarsal amputation on 10/20/2019. Given antibiotics. Discharged after 7 days.   He presents today for his initial visit with a chief complaint of moderate shortness of breath upon minimal exertion. He describes this as chronic in nature having been present for several years. He has associated fatigue, cough, pedal edema, abdominal distention, light-headedness and left lower leg pain along with this. He denies any difficulty sleeping, palpitations, chest pain or decreased appetite.   Currently not weighing daily as he is unable to weight-bear on his foot. He's going to see about putting the scale on a chair and see if he can sit on it and weigh himself that way. When talking about fluid intake, he isn't sure how much he's drinking but knows that it's "way too much". He thinks that his cup may hold 30 ounces of fluid and he's drinking 3-4 of those a day in addition to coffee.  Past Medical History:  Diagnosis Date  . AICD (automatic cardioverter/defibrillator) present   . Anxiety   . Arthritis   . Atherosclerosis of artery of extremity with ulceration ([place]) 09/2019   left foot s/p toe amp requiring  debridement and futher toe amputations.  . Atrial fibrillation ([place])   . Cervical spinal stenosis    with neuropathy  . CHF (congestive heart failure) ([place])   . Constipation   . COPD (chronic obstructive pulmonary disease) ([place])   . Coronary artery disease   . Depression   . Dyspnea   . Dysrhythmia    atrial fibrillation  . Emphysema of lung ([place])   . GERD (gastroesophageal reflux disease)   . Hypertension   . Lung nodule seen on imaging study    being followed by dr. [name]. just watching it for last few years, without change  . Myocardial infarction [facility]) 2004   stent placed, pacemaker implanted 2005  . Oxygen dependent    requires 2L nasal prong oxygen 24 hours a day  . Peripheral vascular disease ([place])   . Presence of permanent cardiac pacemaker [phone]   Past Surgical History:  Procedure Laterality Date  . ABOVE KNEE LEG AMPUTATION Right    after below the knee amputation   . AMPUTATION TOE Left 08/04/2019   Procedure: AMPUTATION TOE MPJ LEFT;  Surgeon: Samara Deist, DPM;  Location: [facility];  Service: Podiatry;  Laterality: Left;  . AMPUTATION TOE Left 10/06/2019   Procedure: AMPUTATION TOE MPJ T1,T2 LEFT;  Surgeon: Samara Deist, DPM;  Location: [facility];  Service: Podiatry;  Laterality: Left;  . APPLICATION OF WOUND VAC Left 01/12/2018   Procedure: APPLICATION OF WOUND VAC;  Surgeon: Algernon Huxley, MD;  Location: [facility];  Service: Vascular;  Laterality: Left;  .  BELOW KNEE LEG AMPUTATION Right   . CATARACT EXTRACTION, BILATERAL Bilateral   . ENDARTERECTOMY FEMORAL Left 08/11/2017   Procedure: ENDARTERECTOMY FEMORAL;  Surgeon: Algernon Huxley, MD;  Location: [facility];  Service: Vascular;  Laterality: Left;  . HEMATOMA EVACUATION Left 01/12/2018   Procedure: EVACUATION HEMATOMA ( DRAINING OF SEROMA);  Surgeon: Algernon Huxley, MD;  Location: [facility];  Service: Vascular;  Laterality: Left;  . IMPLANTABLE CARDIOVERTER DEFIBRILLATOR (ICD) GENERATOR CHANGE Left 02/10/2017    Procedure: ICD GENERATOR CHANGE;  Surgeon: Isaias Cowman, MD;  Location: [facility];  Service: Cardiovascular;  Laterality: Left;  . INSERT / REPLACE / REMOVE PACEMAKER  [id number]  . LOWER EXTREMITY ANGIOGRAPHY Left 06/07/2017   Procedure: LOWER EXTREMITY ANGIOGRAPHY;  Surgeon: Algernon Huxley, MD;  Location: [place] [facility];  Service: Cardiovascular;  Laterality: Left;  . LOWER EXTREMITY ANGIOGRAPHY Left 10/05/2019   Procedure: LOWER EXTREMITY ANGIOGRAPHY;  Surgeon: Algernon Huxley, MD;  Location: [place] [facility];  Service: Cardiovascular;  Laterality: Left;  . LOWER EXTREMITY INTERVENTION  06/07/2017   Procedure: LOWER EXTREMITY INTERVENTION;  Surgeon: Algernon Huxley, MD;  Location: [place] [facility];  Service: Cardiovascular;;  . PERIPHERAL VASCULAR CATHETERIZATION Left 12/09/2015   Procedure: Lower Extremity Angiography;  Surgeon: Algernon Huxley, MD;  Location: [place] [facility];  Service: Cardiovascular;  Laterality: Left;  . PERIPHERAL VASCULAR CATHETERIZATION  12/09/2015   Procedure: Lower Extremity Intervention;  Surgeon: Algernon Huxley, MD;  Location: [place] [facility];  Service: Cardiovascular;;  . TONSILLECTOMY    . TRANSMETATARSAL AMPUTATION Left 10/20/2019   Procedure: TRANSMETATARSAL AMPUTATION;  Surgeon: Sharlotte Alamo, DPM;  Location: [facility];  Service: Podiatry;  Laterality: Left;   Family History  Problem Relation Age of Onset  . Cancer Mother   . Cancer Father   . Heart disease Father    Social History   Tobacco Use  . Smoking status: Former Smoker    Packs/day: 1.00    Years: 42.00    Pack years: 42.00    Types: Cigarettes    Quit date: 09/23/2013    Years since quitting: 6.1  . Smokeless tobacco: Never Used  Substance Use Topics  . Alcohol use: No   Allergies  Allergen Reactions  . Apixaban Rash  . Rivaroxaban Rash   Prior to Admission medications   Medication Sig Start Date End Date Taking? Authorizing Provider  acetaminophen (TYLENOL) 500 MG tablet  Take 1,000 mg by mouth daily as needed for moderate pain or headache.   Yes [provider]  ALPRAZolam Duanne Moron) 1 MG tablet Take 1 mg by mouth 2 (two) times daily as needed for anxiety or sleep.    Yes [provider]  amoxicillin-clavulanate (AUGMENTIN) 875-125 MG tablet Take 1 tablet by mouth every 12 (twelve) hours for 10 days. 10/24/19 11/03/19 Yes Vashti Hey, MD  aspirin EC 81 MG tablet Take 1 tablet (81 mg total) by mouth daily. Patient taking differently: Take 81 mg by mouth daily after supper.  12/09/15  Yes [name], Erskine Squibb, MD  ciprofloxacin (CIPRO) 500 MG tablet Take 1 tablet (500 mg total) by mouth 2 (two) times daily for 10 days. 10/24/19 11/03/19 Yes Vashti Hey, MD  citalopram (CELEXA) 10 MG tablet Take 10 mg by mouth daily.   Yes [provider]  docusate sodium (COLACE) 100 MG capsule Take 100 mg by mouth daily as needed (for constipation.).    Yes [provider]  ENTRESTO 24-26 MG  Take 1 tablet by mouth 2 (two) times daily. 01/05/19  Yes [provider]  fluticasone (FLONASE) 50 MCG/ACT nasal spray Place 2 sprays into both nostrils daily.   Yes [provider]  fluticasone-salmeterol (ADVAIR HFA) 115-21 MCG/ACT inhaler USE 2 INHALATIONS ORALLY EVERY 12 HOURS Patient taking differently: Inhale 2 puffs into the lungs every 12 (twelve) hours.  05/30/18  Yes Flora Lipps, MD  furosemide (LASIX) 20 MG tablet Take 2 tablets (40 mg total) by mouth daily. 10/29/19  Yes Lorella Nimrod, MD  gabapentin (NEURONTIN) 100 MG capsule Take 300 mg by mouth in the morning, at noon, and at bedtime. 09/20/19  Yes [provider]  guaiFENesin (MUCINEX) 600 MG 12 hr tablet Take 600 mg by mouth every evening.    Yes [provider]  ipratropium (ATROVENT) 0.03 % nasal spray Place 2 sprays into both nostrils 2 (two) times daily.    Yes [provider]  Ipratropium-Albuterol (COMBIVENT RESPIMAT) 20-100 MCG/ACT AERS  respimat Inhale 1 puff into the lungs every 4 (four) hours.    Yes [provider]  levalbuterol (XOPENEX) 1.25 MG/3ML nebulizer solution Take 1.25 mg (3 mLs total) by nebulization every 4 (four) hours. Patient taking differently: Take 3 mLs by nebulization every 4 (four) hours as needed for wheezing or shortness of breath.  10/23/16  Yes Theodoro Grist, MD  loratadine (CLARITIN) 10 MG tablet Take 10 mg by mouth at bedtime.   Yes [provider]  lovastatin (MEVACOR) 20 MG tablet Take 20 mg by mouth at bedtime.   Yes [provider]  metoprolol succinate (TOPROL-XL) 25 MG 24 hr tablet Take 25 mg by mouth daily.   Yes [provider]  nitroGLYCERIN (NITROSTAT) 0.4 MG SL tablet Place 0.4 mg under the tongue every 5 (five) minutes as needed for chest pain.   Yes [provider]  oxyCODONE-acetaminophen (PERCOCET) 7.5-325 MG tablet Take 1 tablet by mouth every 6 (six) hours as needed for moderate pain.    Yes [provider]  pantoprazole (PROTONIX) 40 MG tablet Take 40 mg by mouth daily before breakfast.    Yes [provider]  Respiratory Therapy Supplies (FLUTTER) DEVI Use as directed. 05/15/19  Yes Magdalen Spatz, NP  SPIRIVA RESPIMAT 1.25 MCG/ACT AERS INHALE 2 PUFFS BY MOUTH INTO THE LUNGS DAILY Patient taking differently: Inhale 2 puffs into the lungs daily.  05/29/19  Yes Flora Lipps, MD  tamsulosin (FLOMAX) 0.4 MG CAPS capsule Take 0.4 mg by mouth daily after supper.  03/07/18  Yes [provider]  vitamin B-12 (CYANOCOBALAMIN) 1000 MCG tablet Take 1,000 mcg by mouth daily.   Yes [provider]  warfarin (COUMADIN) 5 MG tablet Take 5 mg by mouth daily.   Yes [provider]  Wound Dressings (RESTORE WOUND CARE DRESSING) PADS Apply 1 each topically daily.  01/25/18  Yes [provider]     Review of Systems  Constitutional: Positive for fatigue (with little exertion). Negative for appetite change.   HENT: Positive for postnasal drip. Negative for congestion and sore throat.   Eyes: Negative.   Respiratory: Positive for cough and shortness of breath (with little exertion).   Cardiovascular: Positive for leg swelling. Negative for chest pain and palpitations.  Gastrointestinal: Positive for abdominal distention. Negative for abdominal pain.  Endocrine: Negative.   Genitourinary: Negative.   Musculoskeletal: Positive for arthralgias (left lower leg). Negative for back pain.  Skin: Positive for wound (left foot).  Allergic/Immunologic: Negative.   Neurological:  Positive for light-headedness. Negative for dizziness.  Hematological: Negative for adenopathy. Does not bruise/bleed easily.  Psychiatric/Behavioral: Negative for dysphoric mood and sleep disturbance. The patient is not nervous/anxious.    Vitals:   11/02/19 1259  BP: (!) 85/52  Pulse: 74  Resp: 16  SpO2: 92%  Weight: 167 lb (75.8 kg)  Height: 6\' 1"  (1.854 m)   Wt Readings from Last 3 Encounters:  11/02/19 167 lb (75.8 kg)  10/29/19 167 lb 11.2 oz (76.1 kg)  10/20/19 164 lb 14.5 oz (74.8 kg)   Lab Results  Component Value Date   CREATININE 0.75 10/29/2019   CREATININE 0.74 10/28/2019   CREATININE 0.74 10/27/2019     Physical Exam Vitals and nursing note reviewed.  Constitutional:      Appearance: He is well-developed.  HENT:     Head: Normocephalic and atraumatic.  Cardiovascular:     Rate and Rhythm: Normal rate and regular rhythm.  Pulmonary:     Effort: Pulmonary effort is normal. No respiratory distress.     Breath sounds: No wheezing or rales.  Abdominal:     Palpations: Abdomen is soft.     Tenderness: There is no abdominal tenderness.  Musculoskeletal:     Left lower leg: No tenderness. Edema (1+ edema in left lower leg) present.     Comments: BKA present  Skin:    General: Skin is warm and dry.     Comments: Left foot wrapped in bandage  Neurological:     General: No focal deficit present.      Mental Status: He is alert and oriented to person, place, and time.  Psychiatric:        Mood and Affect: Mood normal.        Behavior: Behavior normal.    Assessment & Plan:  1: Chronic heart failure with preserved ejection fraction with structural changes- - NYHA class III - euvolemic today - unable to safely weigh as he is unable to weight-bear on the left foot; home health nurse is going to try and see if he can weigh sitting on a chair; if so, call for an overnight weight gain of >2 pounds or a weekly weight gain of >5 pounds - adds a little salt to his food and they've recently started reading food labels for sodium content; reviewed the importance of not adding any salt to his food and closely follow a low sodium diet; written dietary information and low sodium cookbook were given to them - explained that he needed to measure how much fluid his cup holds as that he needed to keep his daily fluid intake to closer to 64 ounces / day as it sounds like he's drinking >100 ounces/ day - encouraged him to elevate his leg when sitting for long periods of time - AICD present - saw cardiology Nehemiah Massed) 08/08/19 & returns 12/02/19 - BNP 10/25/19 was 525.4  2: HTN- - BP low even upon recheck; will decrease his entresto to 1/2 tablet BID - instructed him to check his BP every few days - saw PCP Edwina Barth) 07/12/19 - BMP from 10/29/19 reviewed and showed sodium 130, potassium 3.3, creatinine 0.75 and GFR >60  3: COPD- - wearing oxgyen at 2- 2.5L around the clock - using inhaler and nebulizer  4: Atrial fibrillation- - INR 10/29/19 was 2.1  5: PVD- - saw vascular Owens Shark) 09/07/19 - has right BKA and recent left toes have been recently amputated and he feels like like more of his foot will  need to be removed   Medication list was reviewed.   Return in 6 weeks or sooner for any questions/problems before then.

## 2019-11-02 ENCOUNTER — Encounter: Payer: Self-pay | Admitting: Family

## 2019-11-02 ENCOUNTER — Ambulatory Visit: Payer: Medicare HMO | Attending: Family | Admitting: Family

## 2019-11-02 ENCOUNTER — Other Ambulatory Visit: Payer: Self-pay

## 2019-11-02 VITALS — BP 85/52 | HR 74 | Resp 16 | Ht 73.0 in | Wt 167.0 lb

## 2019-11-02 DIAGNOSIS — Z89422 Acquired absence of other left toe(s): Secondary | ICD-10-CM | POA: Diagnosis not present

## 2019-11-02 DIAGNOSIS — R42 Dizziness and giddiness: Secondary | ICD-10-CM | POA: Diagnosis not present

## 2019-11-02 DIAGNOSIS — M86672 Other chronic osteomyelitis, left ankle and foot: Secondary | ICD-10-CM | POA: Diagnosis not present

## 2019-11-02 DIAGNOSIS — Z7982 Long term (current) use of aspirin: Secondary | ICD-10-CM | POA: Diagnosis not present

## 2019-11-02 DIAGNOSIS — Z09 Encounter for follow-up examination after completed treatment for conditions other than malignant neoplasm: Secondary | ICD-10-CM | POA: Diagnosis not present

## 2019-11-02 DIAGNOSIS — I2510 Atherosclerotic heart disease of native coronary artery without angina pectoris: Secondary | ICD-10-CM | POA: Insufficient documentation

## 2019-11-02 DIAGNOSIS — F419 Anxiety disorder, unspecified: Secondary | ICD-10-CM | POA: Diagnosis not present

## 2019-11-02 DIAGNOSIS — I5032 Chronic diastolic (congestive) heart failure: Secondary | ICD-10-CM | POA: Diagnosis not present

## 2019-11-02 DIAGNOSIS — M79662 Pain in left lower leg: Secondary | ICD-10-CM | POA: Insufficient documentation

## 2019-11-02 DIAGNOSIS — I5022 Chronic systolic (congestive) heart failure: Secondary | ICD-10-CM | POA: Diagnosis not present

## 2019-11-02 DIAGNOSIS — Z95810 Presence of automatic (implantable) cardiac defibrillator: Secondary | ICD-10-CM | POA: Insufficient documentation

## 2019-11-02 DIAGNOSIS — R600 Localized edema: Secondary | ICD-10-CM | POA: Diagnosis not present

## 2019-11-02 DIAGNOSIS — Z89511 Acquired absence of right leg below knee: Secondary | ICD-10-CM | POA: Insufficient documentation

## 2019-11-02 DIAGNOSIS — J449 Chronic obstructive pulmonary disease, unspecified: Secondary | ICD-10-CM | POA: Diagnosis not present

## 2019-11-02 DIAGNOSIS — Z9981 Dependence on supplemental oxygen: Secondary | ICD-10-CM | POA: Insufficient documentation

## 2019-11-02 DIAGNOSIS — F329 Major depressive disorder, single episode, unspecified: Secondary | ICD-10-CM | POA: Diagnosis not present

## 2019-11-02 DIAGNOSIS — Z7901 Long term (current) use of anticoagulants: Secondary | ICD-10-CM | POA: Insufficient documentation

## 2019-11-02 DIAGNOSIS — R140 Abdominal distension (gaseous): Secondary | ICD-10-CM | POA: Diagnosis not present

## 2019-11-02 DIAGNOSIS — I4891 Unspecified atrial fibrillation: Secondary | ICD-10-CM | POA: Diagnosis not present

## 2019-11-02 DIAGNOSIS — M869 Osteomyelitis, unspecified: Secondary | ICD-10-CM | POA: Diagnosis not present

## 2019-11-02 DIAGNOSIS — I252 Old myocardial infarction: Secondary | ICD-10-CM | POA: Insufficient documentation

## 2019-11-02 DIAGNOSIS — J9611 Chronic respiratory failure with hypoxia: Secondary | ICD-10-CM | POA: Diagnosis not present

## 2019-11-02 DIAGNOSIS — I480 Paroxysmal atrial fibrillation: Secondary | ICD-10-CM

## 2019-11-02 DIAGNOSIS — Z7951 Long term (current) use of inhaled steroids: Secondary | ICD-10-CM | POA: Diagnosis not present

## 2019-11-02 DIAGNOSIS — I110 Hypertensive heart disease with heart failure: Secondary | ICD-10-CM | POA: Insufficient documentation

## 2019-11-02 DIAGNOSIS — I70219 Atherosclerosis of native arteries of extremities with intermittent claudication, unspecified extremity: Secondary | ICD-10-CM | POA: Diagnosis not present

## 2019-11-02 DIAGNOSIS — Z792 Long term (current) use of antibiotics: Secondary | ICD-10-CM | POA: Insufficient documentation

## 2019-11-02 DIAGNOSIS — Z79899 Other long term (current) drug therapy: Secondary | ICD-10-CM | POA: Diagnosis not present

## 2019-11-02 DIAGNOSIS — I739 Peripheral vascular disease, unspecified: Secondary | ICD-10-CM | POA: Diagnosis not present

## 2019-11-02 DIAGNOSIS — J431 Panlobular emphysema: Secondary | ICD-10-CM | POA: Diagnosis not present

## 2019-11-02 DIAGNOSIS — Z7409 Other reduced mobility: Secondary | ICD-10-CM | POA: Insufficient documentation

## 2019-11-02 DIAGNOSIS — I10 Essential (primary) hypertension: Secondary | ICD-10-CM

## 2019-11-02 DIAGNOSIS — Z87891 Personal history of nicotine dependence: Secondary | ICD-10-CM | POA: Insufficient documentation

## 2019-11-02 DIAGNOSIS — D6851 Activated protein C resistance: Secondary | ICD-10-CM | POA: Diagnosis not present

## 2019-11-02 NOTE — Patient Instructions (Addendum)
   Maintain fluid intake to 60-64 ounces daily   Decrease entresto to 1/2 tablet twice daily

## 2019-11-03 ENCOUNTER — Telehealth: Payer: Self-pay | Admitting: Family

## 2019-11-03 ENCOUNTER — Other Ambulatory Visit: Payer: Self-pay

## 2019-11-03 ENCOUNTER — Ambulatory Visit
Admission: RE | Admit: 2019-11-03 | Discharge: 2019-11-03 | Disposition: A | Discharge status: Home / Self Care | Payer: Medicare HMO | Source: Ambulatory Visit | Attending: Acute Care | Admitting: Acute Care

## 2019-11-03 DIAGNOSIS — R918 Other nonspecific abnormal finding of lung field: Secondary | ICD-10-CM | POA: Diagnosis not present

## 2019-11-03 DIAGNOSIS — J432 Centrilobular emphysema: Secondary | ICD-10-CM | POA: Diagnosis not present

## 2019-11-03 MED ORDER — ENTRESTO 24-26 MG PO TABS: 1.0000 | ORAL_TABLET | Freq: Two times a day (BID) | ORAL | 0 refills | Status: DC

## 2019-11-03 NOTE — Telephone Encounter (Signed)
Returned patient's call regarding entresto. He says that when he tries to cut in in half that the tablet doesn't cut evenly and will splinter. He is supposed to decrease his furosemide down to 20mg  daily after tomorrow (currently 40mg ).   Advised patient to resume taking entresto as 1 tablet BID since it's not cutting cleanly and go ahead and decrease his furosemide beginning tomorrow to 20mg  daily as he's already taken his medications today.   Depending on his BP, we may have to take him off the entresto completely. He was reminded to check his BP every few days at home and call if it remains low.

## 2019-11-05 IMAGING — CR DG FOOT COMPLETE 3+V*L*
3 series · 3 of 3 positions shown · non-contrast
Comparison: None.

CLINICAL DATA: Pain, swelling and redness of left great toe. No
injury.

EXAM:
LEFT FOOT - COMPLETE 3+ VIEW

[foot ap]
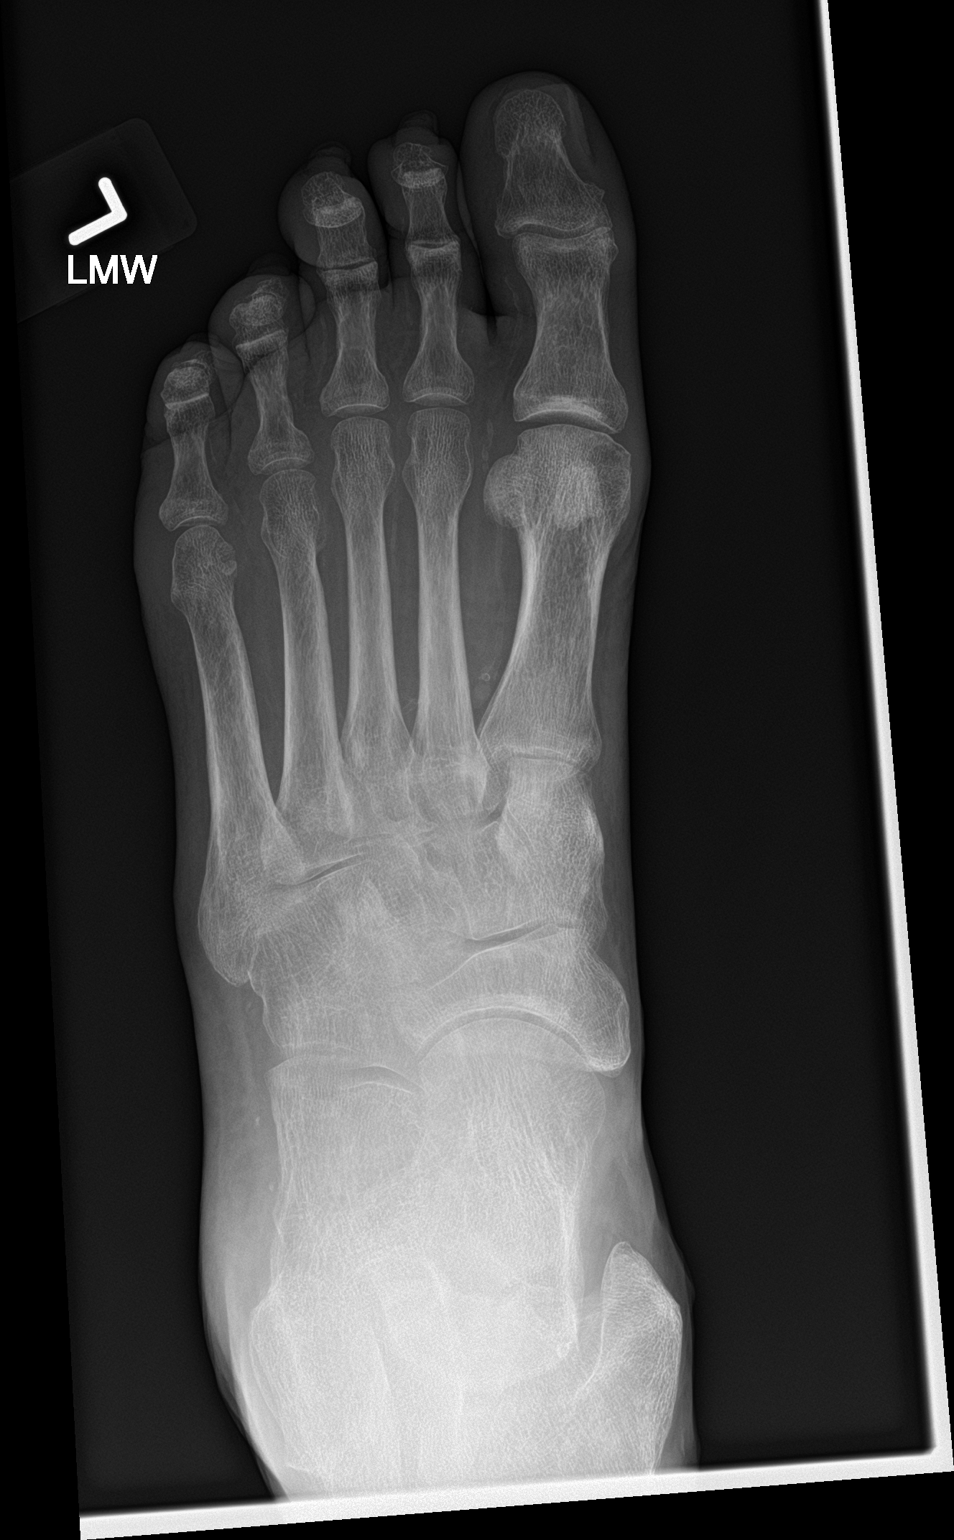

[foot obl]
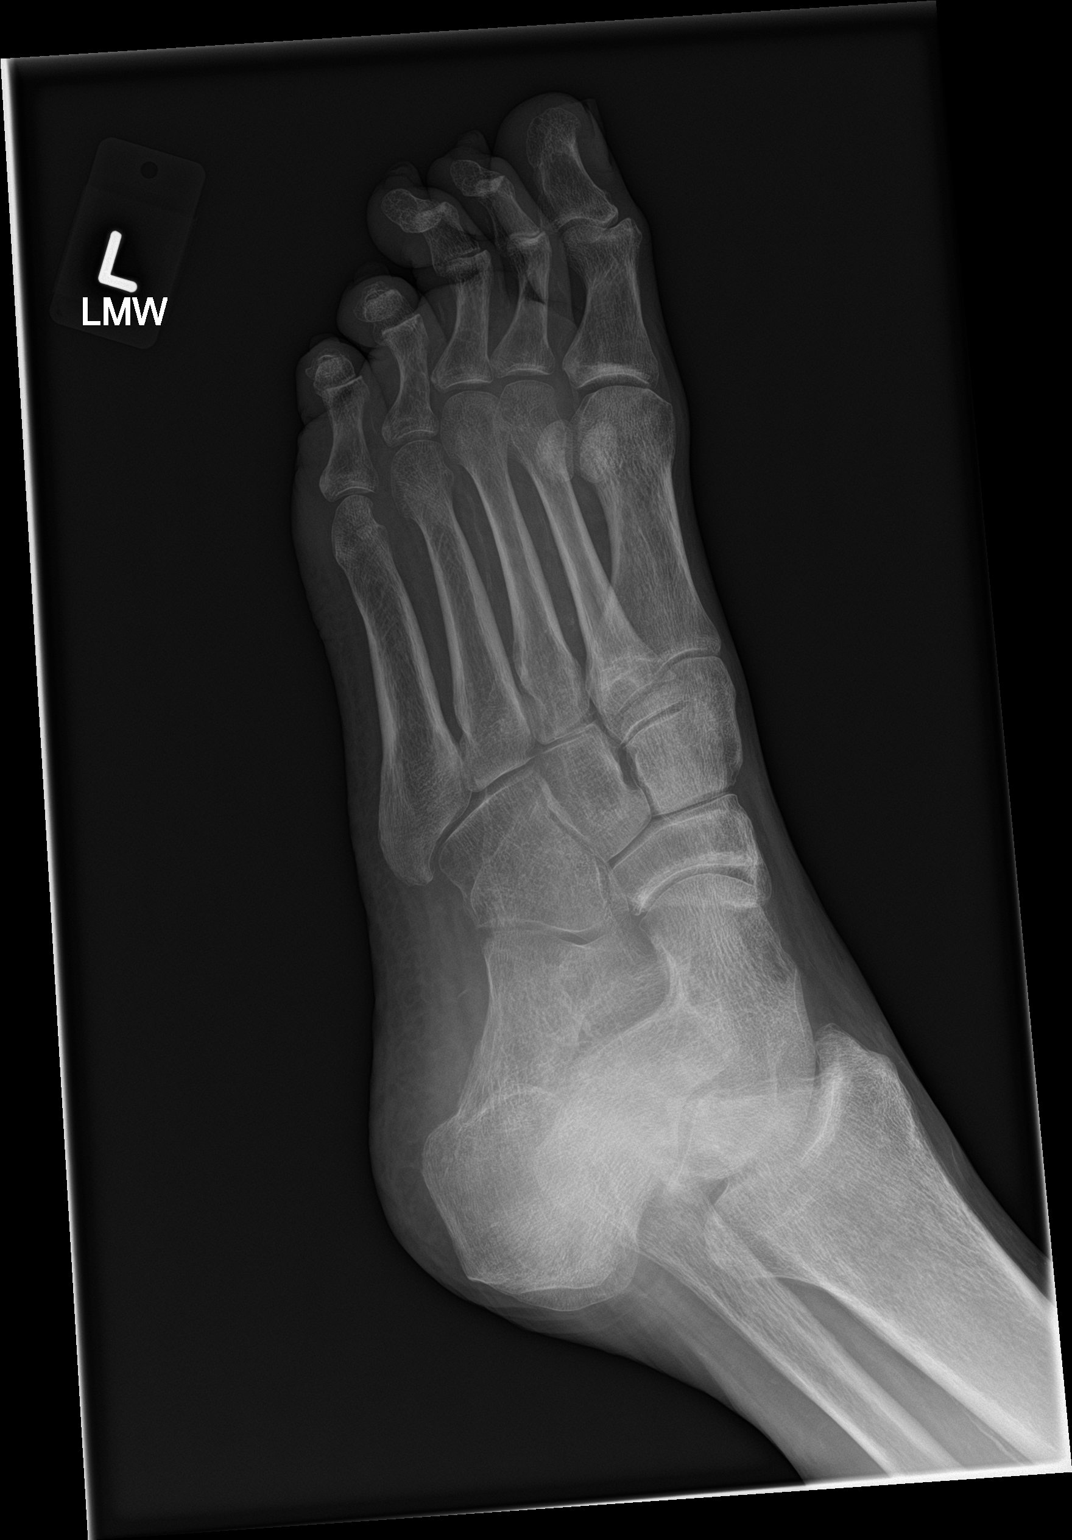

[foot lat]
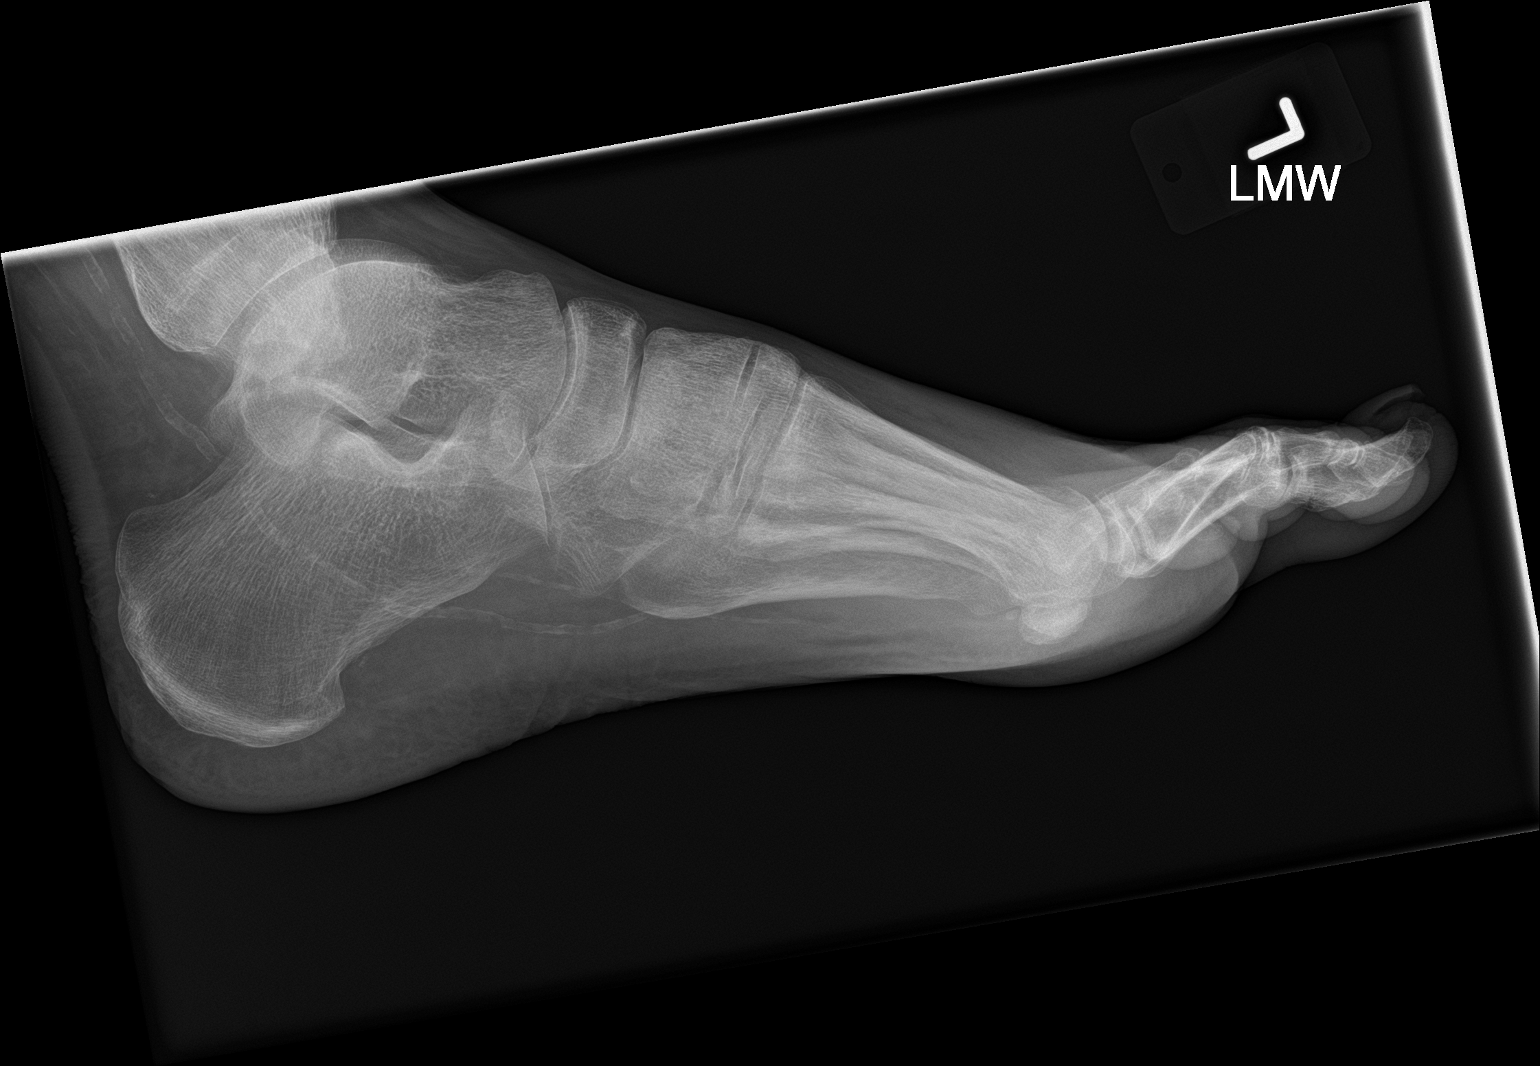

[3 of 3 positions shown; findings below may reference images not displayed]

FINDINGS: Osseous alignment is normal. No acute or suspicious osseous finding.
No fracture line or displaced fracture fragment. No significant
degenerative change.

Vascular calcifications throughout the soft tissues about the left
foot. No acute appearing soft tissue abnormality identified.
IMPRESSION: *No acute findings.
*Atherosclerosis.

## 2019-11-06 ENCOUNTER — Telehealth: Payer: Self-pay | Admitting: Internal Medicine

## 2019-11-06 DIAGNOSIS — L03116 Cellulitis of left lower limb: Secondary | ICD-10-CM | POA: Diagnosis not present

## 2019-11-06 DIAGNOSIS — I70202 Unspecified atherosclerosis of native arteries of extremities, left leg: Secondary | ICD-10-CM | POA: Diagnosis not present

## 2019-11-06 DIAGNOSIS — I110 Hypertensive heart disease with heart failure: Secondary | ICD-10-CM | POA: Diagnosis not present

## 2019-11-06 DIAGNOSIS — J439 Emphysema, unspecified: Secondary | ICD-10-CM | POA: Diagnosis not present

## 2019-11-06 DIAGNOSIS — B965 Pseudomonas (aeruginosa) (mallei) (pseudomallei) as the cause of diseases classified elsewhere: Secondary | ICD-10-CM | POA: Diagnosis not present

## 2019-11-06 DIAGNOSIS — Z4801 Encounter for change or removal of surgical wound dressing: Secondary | ICD-10-CM | POA: Diagnosis not present

## 2019-11-06 DIAGNOSIS — M869 Osteomyelitis, unspecified: Secondary | ICD-10-CM | POA: Diagnosis not present

## 2019-11-06 DIAGNOSIS — I5022 Chronic systolic (congestive) heart failure: Secondary | ICD-10-CM | POA: Diagnosis not present

## 2019-11-06 DIAGNOSIS — Z4781 Encounter for orthopedic aftercare following surgical amputation: Secondary | ICD-10-CM | POA: Diagnosis not present

## 2019-11-06 NOTE — Telephone Encounter (Signed)
Pt has been scheduled for virtual visit with Sarah on 11/08/2019 at 2:30. Pt voiced his understanding and had no further questions.  Nothing further is needed.

## 2019-11-07 DIAGNOSIS — B965 Pseudomonas (aeruginosa) (mallei) (pseudomallei) as the cause of diseases classified elsewhere: Secondary | ICD-10-CM | POA: Diagnosis not present

## 2019-11-07 DIAGNOSIS — J439 Emphysema, unspecified: Secondary | ICD-10-CM | POA: Diagnosis not present

## 2019-11-07 DIAGNOSIS — L03116 Cellulitis of left lower limb: Secondary | ICD-10-CM | POA: Diagnosis not present

## 2019-11-07 DIAGNOSIS — I110 Hypertensive heart disease with heart failure: Secondary | ICD-10-CM | POA: Diagnosis not present

## 2019-11-07 DIAGNOSIS — I70202 Unspecified atherosclerosis of native arteries of extremities, left leg: Secondary | ICD-10-CM | POA: Diagnosis not present

## 2019-11-07 DIAGNOSIS — Z4801 Encounter for change or removal of surgical wound dressing: Secondary | ICD-10-CM | POA: Diagnosis not present

## 2019-11-07 DIAGNOSIS — I5022 Chronic systolic (congestive) heart failure: Secondary | ICD-10-CM | POA: Diagnosis not present

## 2019-11-07 DIAGNOSIS — M869 Osteomyelitis, unspecified: Secondary | ICD-10-CM | POA: Diagnosis not present

## 2019-11-07 DIAGNOSIS — Z4781 Encounter for orthopedic aftercare following surgical amputation: Secondary | ICD-10-CM | POA: Diagnosis not present

## 2019-11-08 ENCOUNTER — Ambulatory Visit (INDEPENDENT_AMBULATORY_CARE_PROVIDER_SITE_OTHER): Payer: Medicare HMO | Admitting: Acute Care

## 2019-11-08 ENCOUNTER — Other Ambulatory Visit: Payer: Self-pay

## 2019-11-08 DIAGNOSIS — J449 Chronic obstructive pulmonary disease, unspecified: Secondary | ICD-10-CM | POA: Diagnosis not present

## 2019-11-08 DIAGNOSIS — R918 Other nonspecific abnormal finding of lung field: Secondary | ICD-10-CM

## 2019-11-08 NOTE — Progress Notes (Signed)
Virtual Visit via Telephone Note  I connected with Tyler [name] on 11/08/19 at  2:30 PM EDT by telephone and verified that I am speaking with the correct person using two identifiers.  Location: Patient: At home Provider: Working remotely from home.    I discussed the limitations, risks, security and privacy concerns of performing an evaluation and management service by telephone and the availability of in person appointments. I also discussed with the patient that there may be a patient responsible charge related to this service. The patient expressed understanding and agreed to proceed.  Synopsis Tyler [name] is a 64 y.o. male with former smoker :He is followed by Dr.Kasa  Synopsis  Pt is a 64 year old former smoker with a 42 pack year smoking history, quit 2015, [name] of Cardiac Pacemaker, PVD, HTN, Myocardial Infarction, CAD, COPD, Chronic Systolic CHF, Atrial Fibrillation, and AICD. He is followed by Dr. [name]. He was recently treated for COPD flare and pneumonia 04/2019 with Levaquin Maintenance   Advair>> twice in the morning and twice at night  Spiriva once daily in the morning. Rescue  Xopenex  Combivent >> every 4 hours   History of Present Illness: Pt. Presents for follow up after CT scan done for nodule surveillance. He has been in the hospital. He states he has had to get  half of one foot amputated due to gangrene in his foot. His CT scan looks stable from nodule perspective. There is notation of possible aspiration per the CT as there is material and or secretions in RIGHT lower lobe bronchus.  Pt  does endorse  Getting  choked on food regularly. Has had barium swallow in past which shows gerd [name]). He states he has been having difficulty staying awake. He endorses snoring.  We will do a home sleep evluation He denies fever, chest pain, orthopnea or hemoptysis. He is compliant with his oxygen and his maintenance inhalers. He states he uses rescue on  average once daily. Usually in the morning.    Observations/Objective: Speaks in full sentences. No Apparent distress 11/03/2019: CT Chest w/o contrast IMPRESSION: 1. Signs of pulmonary emphysema both paraseptal and centrilobular with area of nodularity and linear opacity in the RIGHT upper lobe that has been present since at 2015 and reportedly earlier without change. 2. Small 4 mm nodule along the posterior margin of the RIGHT upper chest new since 2019 unchanged since June of 2020 and stable for 1 years time, likely benign. 3. Material and or secretions in RIGHT lower lobe bronchus. And RIGHT mainstem bronchus, correlate with any risk for aspiration. 4. Small pericardial effusion similar to previous study from June of 2021. 5. Three-vessel coronary artery disease. 6. Emphysema and aortic atherosclerosis.   Assessment and Plan: Stable Pulmonary Nodule One  Pulmonary Nodule measuring 4 mm in size along the posterior margin of the RIGHT upper chest new since 2019 unchanged since June of 2020 and stable for 1 year  Plan I year follow up for surveillance 6 month follow up with Dr. [name] Call if any change, development of hemoptysis/ unintentional weight loss  Material and or secretions in RIGHT lower lobe bronchus. And RIGHT mainstem bronchus, noted on CT Chest w/o 11/03/2019 Endorses getting choked on food. Previous barium swallow done which confirms GERD [name]) Plan Swallow study per speech therapy Follow up with Judson Roch NP or Dr. [name] after swallow eval  Daytime sleepiness Snoring Plan Home Sleep Study Follow up with Judson Roch NP or Dr. [name] once  resulted  COPD Plan Continue Flutter valve prn Continue mucinex 1200 mg once daily with a glass of water Continue Advair and Combi vent and Spiriva as you have been doing Continue Xopenex nebs as you have been doing for breakthrough shortness of breath.  Continue Nasal atrovent and Flonase Consider  ,Daliresp, Trelegy/  Breztri/ PFT's with repeat flares Note your daily symptoms >remember "red flags" for COPD: Increase in cough, increase in sputum production, increase in shortness of breath or activity intolerance. If you notice these symptoms, please call to be seen.  Follow Up Instructions: Follow up after swallow eval/ home sleep eval with Judson Roch NP Follow up with Dr. [name] in 3 months or before as needed   I discussed the assessment and treatment plan with the patient. The patient was provided an opportunity to ask questions and all were answered. The patient agreed with the plan and demonstrated an understanding of the instructions.   The patient was advised to call back or seek an in-person evaluation if the symptoms worsen or if the condition fails to improve as anticipated.  I provided 40  minutes of non-face-to-face time during this encounter.   Magdalen Spatz, NP 11/08/2019

## 2019-11-09 DIAGNOSIS — Z4781 Encounter for orthopedic aftercare following surgical amputation: Secondary | ICD-10-CM | POA: Diagnosis not present

## 2019-11-09 DIAGNOSIS — Z4801 Encounter for change or removal of surgical wound dressing: Secondary | ICD-10-CM | POA: Diagnosis not present

## 2019-11-09 DIAGNOSIS — J439 Emphysema, unspecified: Secondary | ICD-10-CM | POA: Diagnosis not present

## 2019-11-09 DIAGNOSIS — I70202 Unspecified atherosclerosis of native arteries of extremities, left leg: Secondary | ICD-10-CM | POA: Diagnosis not present

## 2019-11-09 DIAGNOSIS — L03116 Cellulitis of left lower limb: Secondary | ICD-10-CM | POA: Diagnosis not present

## 2019-11-09 DIAGNOSIS — I5022 Chronic systolic (congestive) heart failure: Secondary | ICD-10-CM | POA: Diagnosis not present

## 2019-11-09 DIAGNOSIS — M869 Osteomyelitis, unspecified: Secondary | ICD-10-CM | POA: Diagnosis not present

## 2019-11-09 DIAGNOSIS — B965 Pseudomonas (aeruginosa) (mallei) (pseudomallei) as the cause of diseases classified elsewhere: Secondary | ICD-10-CM | POA: Diagnosis not present

## 2019-11-09 DIAGNOSIS — I110 Hypertensive heart disease with heart failure: Secondary | ICD-10-CM | POA: Diagnosis not present

## 2019-11-09 NOTE — Progress Notes (Signed)
These results have been called to the patient he verbalized understanding. I will place an order for a swallow study, as there is concern he is aspirating. He does endorse difficulty  and coughing with eating. Dr. [name], I will leave follow up imaging to you, but he should be good for at least a year. Thanks

## 2019-11-10 ENCOUNTER — Ambulatory Visit (INDEPENDENT_AMBULATORY_CARE_PROVIDER_SITE_OTHER): Payer: Medicare HMO | Admitting: Vascular Surgery

## 2019-11-10 ENCOUNTER — Other Ambulatory Visit: Payer: Self-pay

## 2019-11-10 ENCOUNTER — Telehealth (INDEPENDENT_AMBULATORY_CARE_PROVIDER_SITE_OTHER): Payer: Self-pay

## 2019-11-10 ENCOUNTER — Encounter (INDEPENDENT_AMBULATORY_CARE_PROVIDER_SITE_OTHER): Payer: Self-pay | Admitting: Vascular Surgery

## 2019-11-10 VITALS — BP 116/73 | HR 79 | Ht 73.0 in | Wt 165.0 lb

## 2019-11-10 DIAGNOSIS — I7025 Atherosclerosis of native arteries of other extremities with ulceration: Secondary | ICD-10-CM

## 2019-11-10 DIAGNOSIS — Z89611 Acquired absence of right leg above knee: Secondary | ICD-10-CM

## 2019-11-10 DIAGNOSIS — I10 Essential (primary) hypertension: Secondary | ICD-10-CM | POA: Diagnosis not present

## 2019-11-10 DIAGNOSIS — J449 Chronic obstructive pulmonary disease, unspecified: Secondary | ICD-10-CM

## 2019-11-10 NOTE — Assessment & Plan Note (Signed)
Severe, requiring oxygen

## 2019-11-10 NOTE — Progress Notes (Signed)
MRN : [id number]  Tyler [name] is a 64 y.o. (05-06-1956) male who presents with chief complaint of  Chief Complaint  Patient presents with  . Follow-up    Left footgangrene  .  History of Present Illness: Patient returns today in follow up of his left transmetatarsal amputation wound.  This was done by podiatry about 3 weeks ago.  The plantar aspect has a black eschar and had some mild drainage.  His foot remains swollen as this is leg.  He underwent revascularization by [place] about a month ago.  No fevers or chills or signs of systemic infection.  His podiatrist has basically told him he did not think there was much more he could do and was concerned he would likely need to proceed with the below-knee amputation.  Current Outpatient Medications  Medication Sig Dispense Refill  . acetaminophen (TYLENOL) 500 MG tablet Take 1,000 mg by mouth daily as needed for moderate pain or headache.    . ALPRAZolam (XANAX) 1 MG tablet Take 1 mg by mouth 2 (two) times daily as needed for anxiety or sleep.     Marland Kitchen aspirin EC 81 MG tablet Take 1 tablet (81 mg total) by mouth daily. (Patient taking differently: Take 81 mg by mouth daily after supper. ) 150 tablet 2  . citalopram (CELEXA) 10 MG tablet Take 10 mg by mouth daily.    Marland Kitchen docusate sodium (COLACE) 100 MG capsule Take 100 mg by mouth daily as needed (for constipation.).     Marland Kitchen ENTRESTO 24-26 MG Take 1 tablet by mouth 2 (two) times daily. 60 tablet 0  . fluticasone (FLONASE) 50 MCG/ACT nasal spray Place 2 sprays into both nostrils daily.    . fluticasone-salmeterol (ADVAIR HFA) 115-21 MCG/ACT inhaler USE 2 INHALATIONS ORALLY EVERY 12 HOURS (Patient taking differently: Inhale 2 puffs into the lungs every 12 (twelve) hours. ) 36 Inhaler 5  . furosemide (LASIX) 20 MG tablet Take 2 tablets (40 mg total) by mouth daily. 60 tablet 0  . gabapentin (NEURONTIN) 100 MG capsule Take 300 mg by mouth in the morning, at noon, and at bedtime.    Marland Kitchen guaiFENesin  (MUCINEX) 600 MG 12 hr tablet Take 600 mg by mouth every evening.     Marland Kitchen ipratropium (ATROVENT) 0.03 % nasal spray Place 2 sprays into both nostrils 2 (two) times daily.     . Ipratropium-Albuterol (COMBIVENT RESPIMAT) 20-100 MCG/ACT AERS respimat Inhale 1 puff into the lungs every 4 (four) hours.     Marland Kitchen levalbuterol (XOPENEX) 1.25 MG/3ML nebulizer solution Take 1.25 mg (3 mLs total) by nebulization every 4 (four) hours. (Patient taking differently: Take 3 mLs by nebulization every 4 (four) hours as needed for wheezing or shortness of breath. ) 72 mL 12  . loratadine (CLARITIN) 10 MG tablet Take 10 mg by mouth at bedtime.    . lovastatin (MEVACOR) 20 MG tablet Take 20 mg by mouth at bedtime.    . metoprolol succinate (TOPROL-XL) 25 MG 24 hr tablet Take 25 mg by mouth daily.    . nitroGLYCERIN (NITROSTAT) 0.4 MG SL tablet Place 0.4 mg under the tongue every 5 (five) minutes as needed for chest pain.    Marland Kitchen oxyCODONE-acetaminophen (PERCOCET) 7.5-325 MG tablet Take 1 tablet by mouth every 6 (six) hours as needed for moderate pain.     . pantoprazole (PROTONIX) 40 MG tablet Take 40 mg by mouth daily before breakfast.     . Respiratory Therapy Supplies (FLUTTER) DEVI Use  as directed. 1 each 0  . SPIRIVA RESPIMAT 1.25 MCG/ACT AERS INHALE 2 PUFFS BY MOUTH INTO THE LUNGS DAILY (Patient taking differently: Inhale 2 puffs into the lungs daily. ) 4 g 6  . tamsulosin (FLOMAX) 0.4 MG CAPS capsule Take 0.4 mg by mouth daily after supper.   11  . vitamin B-12 (CYANOCOBALAMIN) 1000 MCG tablet Take 1,000 mcg by mouth daily.    Marland Kitchen warfarin (COUMADIN) 5 MG tablet Take 5 mg by mouth daily.    . Wound Dressings (RESTORE WOUND CARE DRESSING) PADS Apply 1 each topically daily.      No current facility-administered medications for this visit.    Past Medical History:  Diagnosis Date  . AICD (automatic cardioverter/defibrillator) present   . Anxiety   . Arthritis   . Atherosclerosis of artery of extremity with ulceration  ([place]) 09/2019   left foot s/p toe amp requiring debridement and futher toe amputations.  . Atrial fibrillation ([place])   . Cervical spinal stenosis    with neuropathy  . CHF (congestive heart failure) ([place])   . Constipation   . COPD (chronic obstructive pulmonary disease) ([place])   . Coronary artery disease   . Depression   . Dyspnea   . Dysrhythmia    atrial fibrillation  . Emphysema of lung ([place])   . GERD (gastroesophageal reflux disease)   . Hypertension   . Lung nodule seen on imaging study    being followed by dr. [name]. just watching it for last few years, without change  . Myocardial infarction [facility]) 2004   stent placed, pacemaker implanted 2005  . Oxygen dependent    requires 2L nasal prong oxygen 24 hours a day  . Peripheral vascular disease ([place])   . Presence of permanent cardiac pacemaker [phone]    Past Surgical History:  Procedure Laterality Date  . ABOVE KNEE LEG AMPUTATION Right    after below the knee amputation   . AMPUTATION TOE Left 08/04/2019   Procedure: AMPUTATION TOE MPJ LEFT;  Surgeon: Samara Deist, DPM;  Location: [facility];  Service: Podiatry;  Laterality: Left;  . AMPUTATION TOE Left 10/06/2019   Procedure: AMPUTATION TOE MPJ T1,T2 LEFT;  Surgeon: Samara Deist, DPM;  Location: [facility];  Service: Podiatry;  Laterality: Left;  . APPLICATION OF WOUND VAC Left 01/12/2018   Procedure: APPLICATION OF WOUND VAC;  Surgeon: Algernon Huxley, MD;  Location: [facility];  Service: Vascular;  Laterality: Left;  . BELOW KNEE LEG AMPUTATION Right   . CATARACT EXTRACTION, BILATERAL Bilateral   . ENDARTERECTOMY FEMORAL Left 08/11/2017   Procedure: ENDARTERECTOMY FEMORAL;  Surgeon: Algernon Huxley, MD;  Location: [facility];  Service: Vascular;  Laterality: Left;  . HEMATOMA EVACUATION Left 01/12/2018   Procedure: EVACUATION HEMATOMA ( DRAINING OF SEROMA);  Surgeon: Algernon Huxley, MD;  Location: [facility];  Service: Vascular;  Laterality: Left;  . IMPLANTABLE CARDIOVERTER  DEFIBRILLATOR (ICD) GENERATOR CHANGE Left 02/10/2017   Procedure: ICD GENERATOR CHANGE;  Surgeon: Isaias Cowman, MD;  Location: [facility];  Service: Cardiovascular;  Laterality: Left;  . INSERT / REPLACE / REMOVE PACEMAKER  [id number]  . LOWER EXTREMITY ANGIOGRAPHY Left 06/07/2017   Procedure: LOWER EXTREMITY ANGIOGRAPHY;  Surgeon: Algernon Huxley, MD;  Location: [place] [facility];  Service: Cardiovascular;  Laterality: Left;  . LOWER EXTREMITY ANGIOGRAPHY Left 10/05/2019   Procedure: LOWER EXTREMITY ANGIOGRAPHY;  Surgeon: Algernon Huxley, MD;  Location: [place] [facility];  Service: Cardiovascular;  Laterality: Left;  . LOWER  EXTREMITY INTERVENTION  06/07/2017   Procedure: LOWER EXTREMITY INTERVENTION;  Surgeon: Algernon Huxley, MD;  Location: [place] [facility];  Service: Cardiovascular;;  . PERIPHERAL VASCULAR CATHETERIZATION Left 12/09/2015   Procedure: Lower Extremity Angiography;  Surgeon: Algernon Huxley, MD;  Location: [place] [facility];  Service: Cardiovascular;  Laterality: Left;  . PERIPHERAL VASCULAR CATHETERIZATION  12/09/2015   Procedure: Lower Extremity Intervention;  Surgeon: Algernon Huxley, MD;  Location: [place] [facility];  Service: Cardiovascular;;  . TONSILLECTOMY    . TRANSMETATARSAL AMPUTATION Left 10/20/2019   Procedure: TRANSMETATARSAL AMPUTATION;  Surgeon: Sharlotte Alamo, DPM;  Location: [facility];  Service: Podiatry;  Laterality: Left;     Social History   Tobacco Use  . Smoking status: Former Smoker    Packs/day: 1.00    Years: 42.00    Pack years: 42.00    Types: Cigarettes    Quit date: 09/23/2013    Years since quitting: 6.1  . Smokeless tobacco: Never Used  Vaping Use  . Vaping Use: Never used  Substance Use Topics  . Alcohol use: No  . Drug use: No      Family History  Problem Relation Age of Onset  . Cancer Mother   . Cancer Father   . Heart disease Father   no bleeding or clotting disorders  Allergies  Allergen Reactions  . Apixaban Rash  .  Rivaroxaban Rash   REVIEW OF SYSTEMS(Negative unless checked)  Constitutional: [] ????Weight loss [] ????Fever [] ????Chills Cardiac: [] ????Chest pain [] ????Chest pressure [] ????Palpitations [] ????Shortness of breath when laying flat [] ????Shortness of breath at rest [] ????Shortness of breath with exertion. Vascular: [] ????Pain in legs with walking [] ????Pain in legs at rest [] ????Pain in legs when laying flat [] ????Claudication [] ????Pain in feet when walking [] ????Pain in feet at rest [] ????Pain in feet when laying flat [] ????History of DVT [] ????Phlebitis [] ????Swelling in legs [] ????Varicose veins [x] ????Non-healing ulcers Pulmonary: [x] ????Uses home oxygen [] ????Productive cough [] ????Hemoptysis [] ????Wheeze [x] ????COPD [] ????Asthma Neurologic: [] ????Dizziness [] ????Blackouts [] ????Seizures [] ????History of stroke [] ????History of TIA [] ????Aphasia [] ????Temporary blindness [] ????Dysphagia [] ????Weakness or numbness in arms [] ????Weakness or numbness in legs Musculoskeletal: [] ????Arthritis [] ????Joint swelling [] ????Joint pain [x] ????Low back pain Hematologic: [] ????Easy bruising [] ????Easy bleeding [] ????Hypercoagulable state [] ????Anemic  Gastrointestinal: [] ????Blood in stool [] ????Vomiting blood [] ????Gastroesophageal reflux/heartburn [] ????Abdominal pain Genitourinary: [] ????Chronic kidney disease [] ????Difficult urination [] ????Frequent urination [] ????Burning with urination [] ????Hematuria Skin: [] ????Rashes [x] ????Ulcers [x] ????Wounds Psychological: [] ????History of anxiety [] ????History of major depression.    Physical Examination  BP 116/73   Pulse 79   Ht 6\' 1"  (1.854 m)   Wt 165 lb (74.8 kg)   BMI 21.77 kg/m  Gen:  WD/WN, NAD Head: [place]/AT, No temporalis wasting. Ear/Nose/Throat: Hearing grossly intact, nares w/o erythema or drainage Eyes: Conjunctiva clear. Sclera  non-icteric Neck: Supple.  Trachea midline Pulmonary:  Good air movement, respirations mildly labored at rest Cardiac: Irregular Vascular:  Vessel Right Left  Radial Palpable Palpable   Musculoskeletal: M/S 5/5 throughout.  In a wheelchair.  Right AKA.  2+ left lower extremity edema.  The left TMA has a black eschar over about half the length of the plantar aspect of the wound that tracks back about 4 cm.  The skin on either side of this is reasonably healthy.  There is mild serosanguineous drainage. Neurologic: Sensation grossly intact in extremities.  Symmetrical.  Speech is fluent.  Psychiatric: Judgment intact, Mood & affect appropriate for pt's clinical situation. Dermatologic: No rashes or ulcers noted.  No cellulitis or open wounds.       Labs Recent Results (from the  past 2160 hour(s))  SARS CORONAVIRUS 2 (TAT 6-24 HRS) Nasopharyngeal Nasopharyngeal Swab     Status: None   Collection Time: 10/04/19 10:23 AM   Specimen: Nasopharyngeal Swab  Result Value Ref Range   SARS Coronavirus 2 NEGATIVE NEGATIVE    Comment: (NOTE) SARS-CoV-2 target nucleic acids are NOT DETECTED. The SARS-CoV-2 RNA is generally detectable in upper and lower respiratory specimens during the acute phase of infection. Negative results do not preclude SARS-CoV-2 infection, do not rule out co-infections with other pathogens, and should not be used as the sole basis for treatment or other patient management decisions. Negative results must be combined with clinical observations, patient history, and epidemiological information. The expected result is Negative. Fact Sheet for Patients: SugarRoll.be Fact Sheet for Healthcare Providers: https://www.woods-mathews.com/ This test is not yet approved or cleared by the [place] FDA and  has been authorized for detection and/or diagnosis of SARS-CoV-2 by FDA under an Emergency Use Authorization (EUA). This EUA will  remain  in effect (meaning this test can be used) for the duration of the COVID-19 declaration under Section 56 4(b)(1) of the Act, 21 U.S.C. section 360bbb-3(b)(1), unless the authorization is terminated or revoked sooner. Performed at [facility], [place] [address], [place], [place] [address]   BUN     Status: None   Collection Time: 10/05/19  1:41 PM  Result Value Ref Range   BUN 9 8 - 23 mg/dL    Comment: Performed at [facility], [address] [place], [place] [address]  Creatinine, serum     Status: None   Collection Time: 10/05/19  1:41 PM  Result Value Ref Range   Creatinine, Ser 0.67 0.61 - 1.24 mg/dL   GFR calc non Af Amer >60 >60 mL/min   GFR calc Af Amer >60 >60 mL/min    Comment: Performed at [facility], [address] [place], [place] [address]  Protime-INR     Status: None   Collection Time: 10/05/19  1:41 PM  Result Value Ref Range   Prothrombin Time 13.6 11.4 - 15.2 seconds   INR 1.1 0.8 - 1.2    Comment: (NOTE) INR goal varies based on device and disease states. Performed at [facility], [address] [place], [place] [address]   Protime-INR     Status: None   Collection Time: 10/06/19  8:00 AM  Result Value Ref Range   Prothrombin Time 13.7 11.4 - 15.2 seconds   INR 1.1 0.8 - 1.2    Comment: (NOTE) INR goal varies based on device and disease states. Performed at [facility], [address] [place], [place] [address]   APTT     Status: Abnormal   Collection Time: 10/06/19  8:00 AM  Result Value Ref Range   aPTT 40 (H) 24 - 36 seconds    Comment:        IF BASELINE aPTT IS ELEVATED, SUGGEST PATIENT RISK ASSESSMENT BE USED TO DETERMINE APPROPRIATE ANTICOAGULANT THERAPY. Performed at [facility], [address] [place], [place] [address]   Aerobic/Anaerobic Culture (surgical/deep wound)     Status: None   Collection Time: 10/06/19  9:15 AM   Specimen: PATH Other; Tissue  Result  Value Ref Range   Specimen Description      TISSUE Performed at [facility], [address] [place], [place] [address]    Special Requests      NONE Performed at [facility], [address] [place], [place] [address]    Gram Stain      NO WBC SEEN RARE GRAM POSITIVE COCCI RARE GRAM NEGATIVE RODS    Culture      MODERATE PSEUDOMONAS AERUGINOSA FEW METHICILLIN RESISTANT STAPHYLOCOCCUS AUREUS FEW BACTEROIDES THETAIOTAOMICRON BETA LACTAMASE POSITIVE Performed at [facility], [address], [place], [place] [address]    Report Status 10/10/2019 FINAL    Organism ID, Bacteria PSEUDOMONAS AERUGINOSA    Organism ID, Bacteria METHICILLIN RESISTANT STAPHYLOCOCCUS AUREUS       Susceptibility   Methicillin resistant staphylococcus aureus - MIC*    CIPROFLOXACIN <=0.5 SENSITIVE Sensitive     ERYTHROMYCIN >=8 RESISTANT Resistant     GENTAMICIN <=0.5 SENSITIVE Sensitive     OXACILLIN RESISTANT Resistant     TETRACYCLINE 2 SENSITIVE Sensitive     VANCOMYCIN 1 SENSITIVE Sensitive     TRIMETH/SULFA <=10 SENSITIVE Sensitive     CLINDAMYCIN <=0.25 SENSITIVE Sensitive     RIFAMPIN <=0.5 SENSITIVE Sensitive     Inducible Clindamycin NEGATIVE Sensitive     * FEW METHICILLIN RESISTANT STAPHYLOCOCCUS AUREUS   Pseudomonas aeruginosa - MIC*    CEFTAZIDIME 2 SENSITIVE Sensitive     CIPROFLOXACIN <=0.25 SENSITIVE Sensitive     GENTAMICIN <=1 SENSITIVE Sensitive     IMIPENEM 2 SENSITIVE Sensitive     PIP/TAZO 8 SENSITIVE Sensitive     CEFEPIME <=1 SENSITIVE Sensitive     * MODERATE PSEUDOMONAS AERUGINOSA  Surgical pathology     Status: None   Collection Time: 10/06/19 11:20 AM  Result Value Ref Range   SURGICAL PATHOLOGY      SURGICAL PATHOLOGY CASE: [id number] PATIENT: Sherril Croon Surgical Pathology Report     Specimen Submitted: A. Foot, left great, 2nd, and 3rd toes  Clinical History: I73.9 peripheral vascular disease, L97.524 ulcer left foot,  I96 gangrene of toe.      DIAGNOSIS: A. RIGHT FOOT, GREAT TOE, SECOND, AND THIRD TOES; AMPUTATION: - ACUTE OSTEOMYELITIS. - SEVERE CALCIFIC ATHEROSCLEROSIS. - SKIN AND SOFT TISSUE WITH ULCERATION, ABSCESS, AND NECROSIS. - ACTIVE INFLAMMATION IS PRESENT AT THE SKIN/SOFT TISSUE RESECTION MARGIN. - BONE MARGIN IS NEGATIVE FOR ACTIVE INFLAMMATION. - NEGATIVE FOR MALIGNANCY.   GROSS DESCRIPTION: A. Labeled: Great toe, second, and third toes - left foot Received: Formalin Tissue fragment(s): 1 Size: 7.0 x 6.5 x 2.5 cm Description: Received is an amputation specimen consisting of digits #1-#3 of the left foot.  The skin/soft tissue resection margin is inked blue.  The proximal bone resection margins are inked black.  Digit #1 ha s been previously amputated with a portion of skin remaining.  This skin has a 2.4 x 2.7 cm area of yellow discoloration, softening, and necrosis.  The yellow discoloration involves the skin/soft tissue resection margin.  The distal aspect of digit #2 displays a 3.5 x 2.0 cm area of brown discoloration and necrosis, 1.7 cm from the closest skin surgical resection margin.  Digit #3 displays a 6.3 x 4.5 cm area of brown discoloration and necrosis, 0.3 cm from the closest surgical resection margin.  Toenails are absent. Representative sections are submitted following decalcification as follows: 1 - digit #1 displaying residual, necrotic skin in relation to skin/soft tissue resection margin 2-3 - digit #2 displaying brown discolored and necrotic skin with underlying bone 4-5 - digit #3 displaying brown discolored and necrotic skin and underlying bone in relation to skin/soft tissue resection margin 6-7 - representative, perpendicular sections in relation to digit #1 proxim al bone resection  margin 8 - entire, trisected digit #2 proximal bone resection margin 9 - entire, trisected digit #3 proximal bone resection margin  Final Diagnosis performed by Betsy Pries, MD.   Electronically signed 10/10/2019 11:52:24AM The electronic signature indicates that the named Attending Pathologist has evaluated the specimen Technical component performed at [facility], [address] [place], [place] [address] Lab: [phone] Dir: Rush Farmer, MD, MMM  Professional component performed at [facility], [facility], [address], [place], [place] [address] Lab: [phone] Dir: Dellia Nims. Rubinas, MD   Comprehensive metabolic panel     Status: Abnormal   Collection Time: 10/17/19  5:20 PM  Result Value Ref Range   Sodium 126 (L) 135 - 145 mmol/L   Potassium 4.7 3.5 - 5.1 mmol/L   Chloride 88 (L) 98 - 111 mmol/L   CO2 29 22 - 32 mmol/L   Glucose, Bld 102 (H) 70 - 99 mg/dL    Comment: Glucose reference range applies only to samples taken after fasting for at least 8 hours.   BUN 6 (L) 8 - 23 mg/dL   Creatinine, Ser 0.71 0.61 - 1.24 mg/dL   Calcium 8.9 8.9 - 10.3 mg/dL   Total Protein 6.7 6.5 - 8.1 g/dL   Albumin 3.3 (L) 3.5 - 5.0 g/dL   AST 23 15 - 41 U/L   ALT 14 0 - 44 U/L   Alkaline Phosphatase 69 38 - 126 U/L   Total Bilirubin 0.5 0.3 - 1.2 mg/dL   GFR calc non Af Amer >60 >60 mL/min   GFR calc Af Amer >60 >60 mL/min   Anion gap 9 5 - 15    Comment: Performed at [facility], [address] [place], [place] [address]  Lactic acid, plasma     Status: None   Collection Time: 10/17/19  5:20 PM  Result Value Ref Range   Lactic Acid, Venous 0.9 0.5 - 1.9 mmol/L    Comment: Performed at [facility], [address] [place], [place] [address]  CBC with Differential     Status: Abnormal   Collection Time: 10/17/19  5:20 PM  Result Value Ref Range   WBC 10.2 4.0 - 10.5 K/uL   RBC 3.47 (L) 4.22 - 5.81 MIL/uL   Hemoglobin 10.0 (L) 13.0 - 17.0 g/dL   HCT 30.4 (L) 39 - 52 %   MCV 87.6 80.0 - 100.0 fL   MCH 28.8 26.0 - 34.0 pg   MCHC 32.9 30.0 - 36.0 g/dL   RDW 13.0 11.5 - 15.5 %   Platelets 362 150 - 400 K/uL    nRBC 0.0 0.0 - 0.2 %   Neutrophils Relative % 76 %   Neutro Abs 7.6 1.7 - 7.7 K/uL   Lymphocytes Relative 10 %   Lymphs Abs 1.1 0.7 - 4.0 K/uL   Monocytes Relative 12 %   Monocytes Absolute 1.2 (H) 0 - 1 K/uL   Eosinophils Relative 2 %   Eosinophils Absolute 0.2 0 - 0 K/uL   Basophils Relative 0 %   Basophils Absolute 0.0 0 - 0 K/uL   Immature Granulocytes 0 %   Abs Immature Granulocytes 0.04 0.00 - 0.07 K/uL    Comment: Performed at [facility], [address] [place],  [address]  Protime-INR     Status: Abnormal   Collection Time: 10/17/19  5:20 PM  Result Value Ref Range   Prothrombin Time 24.5 (H) 11.4 - 15.2 seconds   INR 2.3 (H)  0.8 - 1.2    Comment: (NOTE) INR goal varies based on device and disease states. Performed at [facility], [address] [place], [place] [address]   Culture, blood (Routine x 2)     Status: None   Collection Time: 10/17/19  5:20 PM   Specimen: BLOOD  Result Value Ref Range   Specimen Description BLOOD RIGHT ANTECUBITAL    Special Requests      BOTTLES DRAWN AEROBIC AND ANAEROBIC Blood Culture adequate volume   Culture      NO GROWTH 5 DAYS Performed at [facility], [address] [place], [place] [address]    Report Status 10/22/2019 FINAL   Culture, blood (Routine x 2)     Status: None   Collection Time: 10/17/19  5:25 PM   Specimen: BLOOD LEFT HAND  Result Value Ref Range   Specimen Description BLOOD LEFT HAND    Special Requests      BOTTLES DRAWN AEROBIC AND ANAEROBIC Blood Culture adequate volume   Culture      NO GROWTH 5 DAYS Performed at [facility], [address], [place], [place] [address]    Report Status 10/23/2019 FINAL   Lactic acid, plasma     Status: None   Collection Time: 10/17/19  7:20 PM  Result Value Ref Range   Lactic Acid, Venous 1.2 0.5 - 1.9 mmol/L    Comment: Performed at [facility], [address] [place], [place] [address]  Urinalysis,  Complete w Microscopic     Status: Abnormal   Collection Time: 10/18/19  6:30 AM  Result Value Ref Range   Color, Urine YELLOW (A) YELLOW   APPearance CLEAR (A) CLEAR   Specific Gravity, Urine 1.006 1.005 - 1.030   pH 6.0 5.0 - 8.0   Glucose, UA NEGATIVE NEGATIVE mg/dL   Hgb urine dipstick SMALL (A) NEGATIVE   Bilirubin Urine NEGATIVE NEGATIVE   Ketones, ur NEGATIVE NEGATIVE mg/dL   Protein, ur NEGATIVE NEGATIVE mg/dL   Nitrite NEGATIVE NEGATIVE   Leukocytes,Ua NEGATIVE NEGATIVE   RBC / HPF 0-5 0 - 5 RBC/hpf   WBC, UA 0-5 0 - 5 WBC/hpf   Bacteria, UA NONE SEEN NONE SEEN   Squamous Epithelial / LPF NONE SEEN 0 - 5    Comment: Performed at [facility], [address] [place], [place] [address]  HIV Antibody (routine testing w rflx)     Status: None   Collection Time: 10/18/19  6:32 AM  Result Value Ref Range   HIV Screen 4th Generation wRfx Non Reactive Non Reactive    Comment: Performed at [facility], [address] [place], [place] [address]  Basic metabolic panel     Status: Abnormal   Collection Time: 10/18/19  6:32 AM  Result Value Ref Range   Sodium 129 (L) 135 - 145 mmol/L   Potassium 4.1 3.5 - 5.1 mmol/L   Chloride 91 (L) 98 - 111 mmol/L   CO2 29 22 - 32 mmol/L   Glucose, Bld 87 70 - 99 mg/dL    Comment: Glucose reference range applies only to samples taken after fasting for at least 8 hours.   BUN 6 (L) 8 - 23 mg/dL   Creatinine, Ser 0.78 0.61 - 1.24 mg/dL   Calcium 8.7 (L) 8.9 - 10.3 mg/dL   GFR calc non Af Amer >60 >60 mL/min   GFR calc Af Amer >60 >60 mL/min   Anion gap 9 5 -  15    Comment: Performed at [facility], [address] [place], [place] [address]  CBC     Status: Abnormal   Collection Time: 10/18/19  6:32 AM  Result Value Ref Range   WBC 10.3 4.0 - 10.5 K/uL   RBC 3.23 (L) 4.22 - 5.81 MIL/uL   Hemoglobin 9.5 (L) 13.0 - 17.0 g/dL   HCT 28.8 (L) 39 - 52 %   MCV 89.2 80.0 - 100.0 fL   MCH 29.4 26.0 - 34.0 pg   MCHC  33.0 30.0 - 36.0 g/dL   RDW 13.0 11.5 - 15.5 %   Platelets 375 150 - 400 K/uL   nRBC 0.0 0.0 - 0.2 %    Comment: Performed at [facility], [address] [place], [place] [address]  Aerobic/Anaerobic Culture (surgical/deep wound)     Status: None   Collection Time: 10/18/19  1:05 PM   Specimen: Foot; Abscess  Result Value Ref Range   Specimen Description      FOOT LEFT FOOT Performed at [facility], [address] [place], [place] [address]    Special Requests      NONE Performed at [facility], [address] [place], [place] [address]    Gram Stain      ABUNDANT WBC PRESENT, PREDOMINANTLY PMN ABUNDANT GRAM POSITIVE COCCI ABUNDANT GRAM NEGATIVE RODS    Culture      ABUNDANT PSEUDOMONAS AERUGINOSA FEW STAPHYLOCOCCUS AUREUS FEW GROUP B STREP(S.AGALACTIAE)ISOLATED TESTING AGAINST S. AGALACTIAE NOT ROUTINELY PERFORMED DUE TO PREDICTABILITY OF AMP/PEN/VAN SUSCEPTIBILITY. NO ANAEROBES ISOLATED Performed at [facility], [place] [address] [place], [place] [address]    Report Status 10/21/2019 FINAL    Organism ID, Bacteria PSEUDOMONAS AERUGINOSA    Organism ID, Bacteria STAPHYLOCOCCUS AUREUS       Susceptibility   Pseudomonas aeruginosa - MIC*    CEFTAZIDIME 2 SENSITIVE Sensitive     CIPROFLOXACIN <=0.25 SENSITIVE Sensitive     GENTAMICIN <=1 SENSITIVE Sensitive     IMIPENEM 2 SENSITIVE Sensitive     PIP/TAZO 8 SENSITIVE Sensitive     CEFEPIME <=1 SENSITIVE Sensitive     * ABUNDANT PSEUDOMONAS AERUGINOSA   Staphylococcus aureus - MIC*    CIPROFLOXACIN <=0.5 SENSITIVE Sensitive     ERYTHROMYCIN >=8 RESISTANT Resistant     GENTAMICIN <=0.5 SENSITIVE Sensitive     OXACILLIN 0.5 SENSITIVE Sensitive     TETRACYCLINE <=1 SENSITIVE Sensitive     VANCOMYCIN 1 SENSITIVE Sensitive     TRIMETH/SULFA <=10 SENSITIVE Sensitive     CLINDAMYCIN <=0.25 SENSITIVE Sensitive     RIFAMPIN <=0.5 SENSITIVE Sensitive     Inducible Clindamycin NEGATIVE  Sensitive     * FEW STAPHYLOCOCCUS AUREUS  SARS Coronavirus 2 by RT PCR (hospital order, performed in [place] hospital lab) Nasopharyngeal Nasopharyngeal Swab     Status: None   Collection Time: 10/18/19  3:54 PM   Specimen: Nasopharyngeal Swab  Result Value Ref Range   SARS Coronavirus 2 NEGATIVE NEGATIVE    Comment: (NOTE) SARS-CoV-2 target nucleic acids are NOT DETECTED. The SARS-CoV-2 RNA is generally detectable in upper and lower respiratory specimens during the acute phase of infection. The lowest concentration of SARS-CoV-2 viral copies this assay can detect is 250 copies / mL. A negative result does not preclude SARS-CoV-2 infection and should not be used as the sole basis for treatment or other patient management decisions.  A negative result may occur with improper specimen collection /  handling, submission of specimen other than nasopharyngeal swab, presence of viral mutation(s) within the areas targeted by this assay, and inadequate number of viral copies (<250 copies / mL). A negative result must be combined with clinical observations, patient history, and epidemiological information. Fact Sheet for Patients:   StrictlyIdeas.no Fact Sheet for Healthcare Providers: BankingDealers.co.za This test is not yet approved or cleared  by the [place] FDA and has been authorized for detection and/or diagnosis of SARS-CoV-2 by FDA under an Emergency Use Authorization (EUA).  This EUA will remain in effect (meaning this test can be used) for the duration of the COVID-19 declaration under Section 564(b)(1) of the Act, 21 U.S.C. section 360bbb-3(b)(1), unless the authorization is terminated or revoked sooner. Performed at [facility], [address] [place], [place] [address]   Creatinine, serum     Status: None   Collection Time: 10/19/19  9:04 AM  Result Value Ref Range   Creatinine, Ser 0.74 0.61 - 1.24 mg/dL    GFR calc non Af Amer >60 >60 mL/min   GFR calc Af Amer >60 >60 mL/min    Comment: Performed at [facility], [address] [place], [place] [address]  Surgical pcr screen     Status: Abnormal   Collection Time: 10/20/19  2:18 AM   Specimen: Nasal Mucosa; Nasal Swab  Result Value Ref Range   MRSA, PCR NEGATIVE NEGATIVE   Staphylococcus aureus POSITIVE (A) NEGATIVE    Comment: (NOTE) The Xpert SA Assay (FDA approved for NASAL specimens in patients 98 years of age and older), is one component of a comprehensive surveillance program. It is not intended to diagnose infection nor to guide or monitor treatment. Performed at [facility], [address] [place], [place] [address]   Basic metabolic panel     Status: Abnormal   Collection Time: 10/20/19  4:18 AM  Result Value Ref Range   Sodium 127 (L) 135 - 145 mmol/L   Potassium 4.3 3.5 - 5.1 mmol/L   Chloride 94 (L) 98 - 111 mmol/L   CO2 27 22 - 32 mmol/L   Glucose, Bld 81 70 - 99 mg/dL    Comment: Glucose reference range applies only to samples taken after fasting for at least 8 hours.   BUN 9 8 - 23 mg/dL   Creatinine, Ser 0.75 0.61 - 1.24 mg/dL   Calcium 8.3 (L) 8.9 - 10.3 mg/dL   GFR calc non Af Amer >60 >60 mL/min   GFR calc Af Amer >60 >60 mL/min   Anion gap 6 5 - 15    Comment: Performed at [facility], [address] [place], [place] [address]  Surgical pathology     Status: None   Collection Time: 10/20/19 11:11 AM  Result Value Ref Range   SURGICAL PATHOLOGY      SURGICAL PATHOLOGY CASE: [id number] PATIENT: Sherril Croon Surgical Pathology Report     Specimen Submitted: A. Transmetatarsal, left; amputation  Clinical History: Osteomyelitis gangrene      DIAGNOSIS: A.  FOOT, LEFT; TRANSMETATARSAL AMPUTATION: - STATUS POST AMPUTATION OF FIRST, SECOND, AND THIRD DIGITS WITH SOFT TISSUE NECROSIS AND ACUTE OSTEOMYELITIS AT PREVIOUS SURGICAL SITE. - PROXIMAL BONE MARGINS  AND THREE SEPARATELY SUBMITTED FRAGMENTS OF BONE, NEGATIVE FOR ACUTE OSTEOMYELITIS. - VIABLE SOFT TISSUE AT RESECTION MARGIN.  GROSS DESCRIPTION: A. Labeled: Left transmetatarsal amputation Received: In formalin Size: 9.7 x 6.3 x 9.5 cm, 3 separately submitted pieces of bone ranging from 1.3 x 1 x  0.3 cm up to 1.5 x 1.2 x 0.7 cm Description of lesion(s): The great, second and third toes have been previously amputated leaving behind a poorly healed partially sutured closed incision that measures 6.7 x 2.5 cm in greatest dimensions.  The skin and so ft tissue surrounding the opening is discolored brown and markedly softened.  A large portion of the skin at the incision and on the dorsal surface of the foot is also discolored dark brown and irregular. Proximal margin: The skin and soft tissue at the margin resection is questionably viable. Bone: The bone at the proximal margin resection is unremarkable, as well as the 3 separately submitted pieces of bone Other findings: The specimen is stated to be inked at the proximal margin, however there is no surgical ink on the main specimen or on the separately submitted piece of bone  Block summary: 1-perpendicular sections from skin and soft tissue margin of resection (margin inked blue) 2-skin and soft tissue from previous incision site 3-bone underlying previous incision site, after decalcification 4-perpendicular sections of bone proximal margin great and second toes, after decalcification 5-perpendicular sections of bone proximal margin third through fifth toes, af ter decalcification 6-7-separately submitted bone after decalcification   Tissue decalcification: Submitted after decalcification  Final Diagnosis performed by Quay Burow, MD.   Electronically signed 10/25/2019 2:14:48PM The electronic signature indicates that the named Attending Pathologist has evaluated the specimen Technical component performed at [facility], [address], [place], [place] [address] Lab: [phone] Dir: Rush Farmer, MD, MMM  Professional component performed at [facility], [place], [place], [place] [address] Lab: [phone] Dir: Dellia Nims. Rubinas, MD   Aerobic/Anaerobic Culture (surgical/deep wound)     Status: None   Collection Time: 10/20/19 11:39 AM   Specimen: Wound  Result Value Ref Range   Specimen Description      WOUND Performed at [facility], [address] [place], [place] [address]    Special Requests      NONE Performed at [facility], [place], [place] [address]    Gram Stain      NO WBC SEEN ABUNDANT GRAM POSITIVE COCCI IN PAIRS    Culture      ABUNDANT PSEUDOMONAS AERUGINOSA FEW STAPHYLOCOCCUS AUREUS ABUNDANT BACTEROIDES THETAIOTAOMICRON BETA LACTAMASE POSITIVE Performed at [facility], [place] [address], [place],  [address]    Report Status 10/23/2019 FINAL    Organism ID, Bacteria PSEUDOMONAS AERUGINOSA    Organism ID, Bacteria STAPHYLOCOCCUS AUREUS       Susceptibility   Pseudomonas aeruginosa - MIC*    CEFTAZIDIME 16 INTERMEDIATE Intermediate     CIPROFLOXACIN <=0.25 SENSITIVE Sensitive     GENTAMICIN <=1 SENSITIVE Sensitive     IMIPENEM 2 SENSITIVE Sensitive     CEFEPIME INTERMEDIATE Intermediate     * ABUNDANT PSEUDOMONAS AERUGINOSA   Staphylococcus aureus - MIC*    CIPROFLOXACIN <=0.5 SENSITIVE Sensitive     ERYTHROMYCIN <=0.25 SENSITIVE Sensitive     GENTAMICIN <=0.5 SENSITIVE Sensitive     OXACILLIN <=0.25 SENSITIVE Sensitive     TETRACYCLINE >=16 RESISTANT Resistant     VANCOMYCIN 1 SENSITIVE Sensitive     TRIMETH/SULFA <=10 SENSITIVE Sensitive     CLINDAMYCIN <=0.25 SENSITIVE Sensitive     RIFAMPIN <=0.5 SENSITIVE Sensitive     Inducible Clindamycin NEGATIVE Sensitive     * FEW STAPHYLOCOCCUS AUREUS  Basic metabolic panel     Status: Abnormal   Collection Time:  10/21/19  4:43 AM  Result Value Ref  Range   Sodium 131 (L) 135 - 145 mmol/L   Potassium 4.4 3.5 - 5.1 mmol/L   Chloride 98 98 - 111 mmol/L   CO2 27 22 - 32 mmol/L   Glucose, Bld 98 70 - 99 mg/dL    Comment: Glucose reference range applies only to samples taken after fasting for at least 8 hours.   BUN 9 8 - 23 mg/dL   Creatinine, Ser 0.72 0.61 - 1.24 mg/dL   Calcium 8.2 (L) 8.9 - 10.3 mg/dL   GFR calc non Af Amer >60 >60 mL/min   GFR calc Af Amer >60 >60 mL/min   Anion gap 6 5 - 15    Comment: Performed at [facility], [address] [place], [place] [address]  CBC     Status: Abnormal   Collection Time: 10/21/19  4:43 AM  Result Value Ref Range   WBC 10.4 4.0 - 10.5 K/uL   RBC 2.70 (L) 4.22 - 5.81 MIL/uL   Hemoglobin 7.9 (L) 13.0 - 17.0 g/dL   HCT 24.4 (L) 39 - 52 %   MCV 90.4 80.0 - 100.0 fL   MCH 29.3 26.0 - 34.0 pg   MCHC 32.4 30.0 - 36.0 g/dL   RDW 13.3 11.5 - 15.5 %   Platelets 351 150 - 400 K/uL   nRBC 0.0 0.0 - 0.2 %    Comment: Performed at [facility], [address] [place], [place] [address]  CBC     Status: Abnormal   Collection Time: 10/22/19  4:10 AM  Result Value Ref Range   WBC 10.5 4.0 - 10.5 K/uL   RBC 2.61 (L) 4.22 - 5.81 MIL/uL   Hemoglobin 7.6 (L) 13.0 - 17.0 g/dL   HCT 23.5 (L) 39 - 52 %   MCV 90.0 80.0 - 100.0 fL   MCH 29.1 26.0 - 34.0 pg   MCHC 32.3 30.0 - 36.0 g/dL   RDW 13.2 11.5 - 15.5 %   Platelets 320 150 - 400 K/uL   nRBC 0.0 0.0 - 0.2 %    Comment: Performed at [facility], [address] [place], [place] [address]  Iron and TIBC     Status: Abnormal   Collection Time: 10/22/19  4:10 AM  Result Value Ref Range   Iron 11 (L) 45 - 182 ug/dL   TIBC 199 (L) 250 - 450 ug/dL   Saturation Ratios 6 (L) 17.9 - 39.5 %   UIBC 188 ug/dL    Comment: Performed at [facility], [address] [place], [place] [address]  Ferritin     Status: None   Collection Time: 10/22/19  4:10 AM  Result Value Ref Range   Ferritin 220 24 - 336 ng/mL     Comment: Performed at [facility], [address] [place], [place] [address]  Basic metabolic panel     Status: Abnormal   Collection Time: 10/22/19  4:10 AM  Result Value Ref Range   Sodium 131 (L) 135 - 145 mmol/L   Potassium 4.4 3.5 - 5.1 mmol/L   Chloride 99 98 - 111 mmol/L   CO2 26 22 - 32 mmol/L   Glucose, Bld 90 70 - 99 mg/dL    Comment: Glucose reference range applies only to samples taken after fasting for at least 8 hours.   BUN 9 8 - 23 mg/dL   Creatinine, Ser 0.70 0.61 - 1.24 mg/dL  Calcium 8.2 (L) 8.9 - 10.3 mg/dL   GFR calc non Af Amer >60 >60 mL/min   GFR calc Af Amer >60 >60 mL/min   Anion gap 6 5 - 15    Comment: Performed at [facility], [address] [place], [place] [address]  Protime-INR     Status: Abnormal   Collection Time: 10/23/19  5:03 AM  Result Value Ref Range   Prothrombin Time 17.5 (H) 11.4 - 15.2 seconds   INR 1.5 (H) 0.8 - 1.2    Comment: (NOTE) INR goal varies based on device and disease states. Performed at [facility], [address] [place], [place] [address]   Basic metabolic panel     Status: Abnormal   Collection Time: 10/23/19  5:03 AM  Result Value Ref Range   Sodium 129 (L) 135 - 145 mmol/L   Potassium 4.2 3.5 - 5.1 mmol/L   Chloride 95 (L) 98 - 111 mmol/L   CO2 27 22 - 32 mmol/L   Glucose, Bld 104 (H) 70 - 99 mg/dL    Comment: Glucose reference range applies only to samples taken after fasting for at least 8 hours.   BUN 8 8 - 23 mg/dL   Creatinine, Ser 0.74 0.61 - 1.24 mg/dL   Calcium 8.5 (L) 8.9 - 10.3 mg/dL   GFR calc non Af Amer >60 >60 mL/min   GFR calc Af Amer >60 >60 mL/min   Anion gap 7 5 - 15    Comment: Performed at [facility], [address] [place], [place] [address]  CBC     Status: Abnormal   Collection Time: 10/23/19  5:03 AM  Result Value Ref Range   WBC 8.8 4.0 - 10.5 K/uL   RBC 2.60 (L) 4.22 - 5.81 MIL/uL   Hemoglobin 7.7 (L) 13.0 - 17.0 g/dL   HCT 22.5  (L) 39 - 52 %   MCV 86.5 80.0 - 100.0 fL   MCH 29.6 26.0 - 34.0 pg   MCHC 34.2 30.0 - 36.0 g/dL   RDW 13.4 11.5 - 15.5 %   Platelets 279 150 - 400 K/uL   nRBC 0.0 0.0 - 0.2 %    Comment: Performed at [facility], [address] [place], [place] [address]  Protime-INR     Status: Abnormal   Collection Time: 10/24/19  4:29 AM  Result Value Ref Range   Prothrombin Time 18.3 (H) 11.4 - 15.2 seconds   INR 1.6 (H) 0.8 - 1.2    Comment: (NOTE) INR goal varies based on device and disease states. Performed at [facility], [address] [place], [place] [address]   CBC     Status: Abnormal   Collection Time: 10/25/19  6:46 AM  Result Value Ref Range   WBC 9.7 4.0 - 10.5 K/uL   RBC 2.91 (L) 4.22 - 5.81 MIL/uL   Hemoglobin 8.3 (L) 13.0 - 17.0 g/dL   HCT 25.9 (L) 39 - 52 %   MCV 89.0 80.0 - 100.0 fL   MCH 28.5 26.0 - 34.0 pg   MCHC 32.0 30.0 - 36.0 g/dL   RDW 13.5 11.5 - 15.5 %   Platelets 404 (H) 150 - 400 K/uL   nRBC 0.0 0.0 - 0.2 %    Comment: Performed at [facility], [address] [place], [place] [address]  Comprehensive metabolic panel     Status: Abnormal   Collection Time: 10/25/19  6:46 AM  Result Value  Ref Range   Sodium 133 (L) 135 - 145 mmol/L   Potassium 4.8 3.5 - 5.1 mmol/L    Comment: HEMOLYSIS AT THIS LEVEL MAY AFFECT RESULT   Chloride 94 (L) 98 - 111 mmol/L   CO2 27 22 - 32 mmol/L   Glucose, Bld 117 (H) 70 - 99 mg/dL    Comment: Glucose reference range applies only to samples taken after fasting for at least 8 hours.   BUN 5 (L) 8 - 23 mg/dL   Creatinine, Ser 0.75 0.61 - 1.24 mg/dL   Calcium 8.8 (L) 8.9 - 10.3 mg/dL   Total Protein 6.3 (L) 6.5 - 8.1 g/dL   Albumin 3.0 (L) 3.5 - 5.0 g/dL   AST 28 15 - 41 U/L   ALT 19 0 - 44 U/L   Alkaline Phosphatase 57 38 - 126 U/L   Total Bilirubin 0.6 0.3 - 1.2 mg/dL   GFR calc non Af Amer >60 >60 mL/min   GFR calc Af Amer >60 >60 mL/min   Anion gap 12 5 - 15    Comment: Performed at  [facility], [address] [place], [place] [address]  Brain natriuretic peptide     Status: Abnormal   Collection Time: 10/25/19  6:46 AM  Result Value Ref Range   B Natriuretic Peptide 525.4 (H) 0.0 - 100.0 pg/mL    Comment: Performed at [facility], [place], [place] [address]  Troponin I (High Sensitivity)     Status: None   Collection Time: 10/25/19  6:46 AM  Result Value Ref Range   Troponin I (High Sensitivity) 15 <18 ng/L    Comment: (NOTE) Elevated high sensitivity troponin I (hsTnI) values and significant  changes across serial measurements may suggest ACS but many other  chronic and acute conditions are known to elevate hsTnI results.  Refer to the "Links" section for chest pain algorithms and additional  guidance. Performed at [facility], [address] [place], [place] [address]   Blood gas, venous     Status: Abnormal   Collection Time: 10/25/19  7:32 AM  Result Value Ref Range   pH, Ven 7.40 7.25 - 7.43   pCO2, Ven 50 44 - 60 mmHg   pO2, Ven 38.0 32 - 45 mmHg   Bicarbonate 31.0 (H) 20.0 - 28.0 mmol/L   Acid-Base Excess 5.4 (H) 0.0 - 2.0 mmol/L   O2 Saturation 72.1 %   Patient temperature 37.0    Collection site VEIN    Sample type VENIPUNCTURE     Comment: Performed at [facility], [address] [place], [place] [address]  SARS Coronavirus 2 by RT PCR (hospital order, performed in [facility] hospital lab) Nasopharyngeal Nasopharyngeal Swab     Status: None   Collection Time: 10/25/19 10:29 AM   Specimen: Nasopharyngeal Swab  Result Value Ref Range   SARS Coronavirus 2 NEGATIVE NEGATIVE    Comment: (NOTE) SARS-CoV-2 target nucleic acids are NOT DETECTED. The SARS-CoV-2 RNA is generally detectable in upper and lower respiratory specimens during the acute phase of infection. The lowest concentration of SARS-CoV-2 viral copies this assay can detect is 250 copies / mL. A negative result does not  preclude SARS-CoV-2 infection and should not be used as the sole basis for treatment or other patient management decisions.  A negative result may occur with improper specimen collection / handling, submission of specimen other than nasopharyngeal swab, presence of viral mutation(s) within the areas targeted  by this assay, and inadequate number of viral copies (<250 copies / mL). A negative result must be combined with clinical observations, patient history, and epidemiological information. Fact Sheet for Patients:   StrictlyIdeas.no Fact Sheet for Healthcare Providers: BankingDealers.co.za This test is not yet approved or cleared  by the [place] FDA and has been authorized for detection and/or diagnosis of SARS-CoV-2 by FDA under an Emergency Use Authorization (EUA).  This EUA will remain in effect (meaning this test can be used) for the duration of the COVID-19 declaration under Section 564(b)(1) of the Act, 21 U.S.C. section 360bbb-3(b)(1), unless the authorization is terminated or revoked sooner. Performed at [facility], [address] [place], [place] [address]   Protime-INR     Status: Abnormal   Collection Time: 10/25/19  2:50 PM  Result Value Ref Range   Prothrombin Time 20.1 (H) 11.4 - 15.2 seconds   INR 1.8 (H) 0.8 - 1.2    Comment: (NOTE) INR goal varies based on device and disease states. Performed at [facility], [address] [place], [place] [address]   Culture, sputum-assessment     Status: None   Collection Time: 10/25/19  4:28 PM   Specimen: Expectorated Sputum  Result Value Ref Range   Specimen Description EXPECTORATED SPUTUM    Special Requests NONE    Sputum evaluation      Sputum specimen not acceptable for testing.  Please recollect.   RESULT CALLED TO, READ BACK BY AND VERIFIED WITH: JESSICA FULCHER 10/25/19 1654 KLW Performed at [facility], [address]  [place], [place] [address]    Report Status 10/25/2019 FINAL   ECHOCARDIOGRAM COMPLETE     Status: None   Collection Time: 10/25/19  7:01 PM  Result Value Ref Range   Weight 2,608 oz   Height 73 in   BP 132/73 mmHg  Basic metabolic panel     Status: Abnormal   Collection Time: 10/26/19  1:43 AM  Result Value Ref Range   Sodium 132 (L) 135 - 145 mmol/L   Potassium 4.0 3.5 - 5.1 mmol/L   Chloride 90 (L) 98 - 111 mmol/L   CO2 30 22 - 32 mmol/L   Glucose, Bld 131 (H) 70 - 99 mg/dL    Comment: Glucose reference range applies only to samples taken after fasting for at least 8 hours.   BUN 8 8 - 23 mg/dL   Creatinine, Ser 0.71 0.61 - 1.24 mg/dL   Calcium 8.9 8.9 - 10.3 mg/dL   GFR calc non Af Amer >60 >60 mL/min   GFR calc Af Amer >60 >60 mL/min   Anion gap 12 5 - 15    Comment: Performed at [facility], [address] [place], [place] [address]  Protime-INR     Status: Abnormal   Collection Time: 10/26/19  1:43 AM  Result Value Ref Range   Prothrombin Time 20.9 (H) 11.4 - 15.2 seconds   INR 1.9 (H) 0.8 - 1.2    Comment: (NOTE) INR goal varies based on device and disease states. Performed at [facility], [address] [place], [place] [address]   Fibrin derivatives D-[facility] only)     Status: Abnormal   Collection Time: 10/26/19  1:43 AM  Result Value Ref Range   Fibrin derivatives D-dimer ([facility]) 565.31 (H) 0.00 - 499.00 ng/mL (FEU)    Comment: (NOTE) <> Exclusion of Venous Thromboembolism (VTE) - OUTPATIENT ONLY   (Emergency Department or [place])  0-499 ng/ml (FEU): With a low to intermediate pretest probability                      for VTE this test result excludes the diagnosis                      of VTE.   >499 ng/ml (FEU) : VTE not excluded; additional work up for VTE is                      required. <> Testing on Inpatients and Evaluation of Disseminated Intravascular   Coagulation (DIC) Reference Range:   0-499 ng/ml (FEU) Performed at  [facility], [address] [place], [place] [address]   Magnesium     Status: None   Collection Time: 10/26/19  1:43 AM  Result Value Ref Range   Magnesium 1.7 1.7 - 2.4 mg/dL    Comment: Performed at [facility], [address] [place], [place] [address]  Procalcitonin - Baseline     Status: None   Collection Time: 10/26/19 11:45 AM  Result Value Ref Range   Procalcitonin <0.10 ng/mL    Comment:        Interpretation: PCT (Procalcitonin) <= 0.5 ng/mL: Systemic infection (sepsis) is not likely. Local bacterial infection is possible. (NOTE)       Sepsis PCT Algorithm           Lower Respiratory Tract                                      Infection PCT Algorithm    ----------------------------     ----------------------------         PCT < 0.25 ng/mL                PCT < 0.10 ng/mL         Strongly encourage             Strongly discourage   discontinuation of antibiotics    initiation of antibiotics    ----------------------------     -----------------------------       PCT 0.25 - 0.50 ng/mL            PCT 0.10 - 0.25 ng/mL               OR       >80% decrease in PCT            Discourage initiation of                                            antibiotics      Encourage discontinuation           of antibiotics    ----------------------------     -----------------------------         PCT >= 0.50 ng/mL              PCT 0.26 - 0.50 ng/mL               AND        <80% decrease in PCT             Encourage initiation of  antibiotics       Encourage continuation           of antibiotics    ----------------------------     -----------------------------        PCT >= 0.50 ng/mL                  PCT > 0.50 ng/mL               AND         increase in PCT                  Strongly encourage                                      initiation of antibiotics    Strongly encourage escalation           of antibiotics                                      -----------------------------                                           PCT <= 0.25 ng/mL                                                 OR                                        > 80% decrease in PCT                                     Discontinue / Do not initiate                                             antibiotics Performed at [facility], [address], [place], [place] [address]   Basic metabolic panel     Status: Abnormal   Collection Time: 10/27/19  5:41 AM  Result Value Ref Range   Sodium 131 (L) 135 - 145 mmol/L   Potassium 3.5 3.5 - 5.1 mmol/L   Chloride 90 (L) 98 - 111 mmol/L   CO2 31 22 - 32 mmol/L   Glucose, Bld 99 70 - 99 mg/dL    Comment: Glucose reference range applies only to samples taken after fasting for at least 8 hours.   BUN 12 8 - 23 mg/dL   Creatinine, Ser 0.74 0.61 - 1.24 mg/dL   Calcium 8.7 (L) 8.9 - 10.3 mg/dL   GFR calc non Af Amer >60 >60 mL/min   GFR calc Af Amer >60 >60 mL/min   Anion gap 10 5 - 15    Comment: Performed at [facility], [address] [place], [place] [address]  Protime-INR  Status: Abnormal   Collection Time: 10/27/19  5:41 AM  Result Value Ref Range   Prothrombin Time 29.2 (H) 11.4 - 15.2 seconds   INR 2.9 (H) 0.8 - 1.2    Comment: (NOTE) INR goal varies based on device and disease states. Performed at [facility], [address] [place], [place] [address]   Basic metabolic panel     Status: Abnormal   Collection Time: 10/28/19  6:19 AM  Result Value Ref Range   Sodium 131 (L) 135 - 145 mmol/L   Potassium 3.6 3.5 - 5.1 mmol/L   Chloride 89 (L) 98 - 111 mmol/L   CO2 32 22 - 32 mmol/L   Glucose, Bld 86 70 - 99 mg/dL    Comment: Glucose reference range applies only to samples taken after fasting for at least 8 hours.   BUN 11 8 - 23 mg/dL   Creatinine, Ser 0.74 0.61 - 1.24 mg/dL   Calcium 8.8 (L) 8.9 - 10.3 mg/dL   GFR calc non Af Amer >60 >60 mL/min    GFR calc Af Amer >60 >60 mL/min   Anion gap 10 5 - 15    Comment: Performed at [facility], [address] [place], [place] [address]  Protime-INR     Status: Abnormal   Collection Time: 10/28/19  6:19 AM  Result Value Ref Range   Prothrombin Time 22.8 (H) 11.4 - 15.2 seconds   INR 2.1 (H) 0.8 - 1.2    Comment: (NOTE) INR goal varies based on device and disease states. Performed at [facility], [address] [place], [place] [address]   CBC     Status: Abnormal   Collection Time: 10/28/19  6:19 AM  Result Value Ref Range   WBC 10.8 (H) 4.0 - 10.5 K/uL   RBC 3.15 (L) 4.22 - 5.81 MIL/uL   Hemoglobin 9.0 (L) 13.0 - 17.0 g/dL   HCT 27.0 (L) 39 - 52 %   MCV 85.7 80.0 - 100.0 fL   MCH 28.6 26.0 - 34.0 pg   MCHC 33.3 30.0 - 36.0 g/dL   RDW 13.3 11.5 - 15.5 %   Platelets 429 (H) 150 - 400 K/uL   nRBC 0.0 0.0 - 0.2 %    Comment: Performed at [facility], [address], [place], [place] [address]  Basic metabolic panel     Status: Abnormal   Collection Time: 10/29/19  7:08 AM  Result Value Ref Range   Sodium 130 (L) 135 - 145 mmol/L   Potassium 3.3 (L) 3.5 - 5.1 mmol/L   Chloride 88 (L) 98 - 111 mmol/L   CO2 30 22 - 32 mmol/L   Glucose, Bld 107 (H) 70 - 99 mg/dL    Comment: Glucose reference range applies only to samples taken after fasting for at least 8 hours.   BUN 10 8 - 23 mg/dL   Creatinine, Ser 0.75 0.61 - 1.24 mg/dL   Calcium 8.8 (L) 8.9 - 10.3 mg/dL   GFR calc non Af Amer >60 >60 mL/min   GFR calc Af Amer >60 >60 mL/min   Anion gap 12 5 - 15    Comment: Performed at [facility], [address] [place], [place] [address]  Protime-INR     Status: Abnormal   Collection Time: 10/29/19  7:08 AM  Result Value Ref Range   Prothrombin Time 22.5 (H) 11.4 - 15.2 seconds   INR 2.1 (H) 0.8 - 1.2  Comment: (NOTE) INR goal varies based on device and disease states. Performed at [facility], [address],  [place], [place] [address]     Radiology DG Chest 2 View  Result Date: 10/17/2019 CLINICAL DATA:  Suspected sepsis EXAM: CHEST - 2 VIEW COMPARISON:  06/12/2019 FINDINGS: Left-sided pacing device as before. No focal opacity, pleural effusion, or pneumothorax. Stable mild cardiomegaly with aortic atherosclerosis. No pneumothorax. Hyperinflation with emphysematous disease IMPRESSION: No active cardiopulmonary disease. Hyperinflation with emphysematous disease Electronically Signed   By: Donavan Foil M.D.   On: 10/17/2019 18:21   CT Chest Wo Contrast  Result Date: 11/04/2019 CLINICAL DATA:  Abnormal x-ray, lung nodule seen on previous imaging. EXAM: CT CHEST WITHOUT CONTRAST TECHNIQUE: Multidetector CT imaging of the chest was performed following the standard protocol without IV contrast. COMPARISON:  10/26/2019 FINDINGS: Cardiovascular: Calcified atheromatous plaque of the thoracic aorta. No sign of aneurysm. Three-vessel coronary artery disease. Heart size mildly enlarged. Signs of pacer defibrillator, dual lead with power pack over the LEFT chest as before. Limited assessment of heart and great vessels given lack of intravenous contrast. Small pericardial effusion similar to previous study from June of 2021. Central pulmonary vasculature is of normal caliber. Mediastinum/Nodes: Calcification of LEFT hemi thyroid is dense and unchanged. No axillary lymphadenopathy. No mediastinal lymphadenopathy. Mildly patulous esophagus. No hilar adenopathy. Lungs/Pleura: Signs of pulmonary emphysema both paraseptal and centrilobular with area of nodularity and linear opacity in the RIGHT upper lobe that has been present since at 2015 and reportedly earlier without change. Small nodule along the posterior margin of the RIGHT upper chest 4 mm new since 2019. (Image 56, series 3) airways are patent. No additional nodules. Mild bronchial wall thickening. Secretions noted in the RIGHT lower lobe bronchus. Upper Abdomen: Splenic  contours are lobular similar to prior study. The adrenals are unremarkable. Musculoskeletal: No acute musculoskeletal process. Spinal degenerative changes. IMPRESSION: 1. Signs of pulmonary emphysema both paraseptal and centrilobular with area of nodularity and linear opacity in the RIGHT upper lobe that has been present since at 2015 and reportedly earlier without change. 2. Small 4 mm nodule along the posterior margin of the RIGHT upper chest new since 2019 unchanged since June of 2020 and stable for 1 years time, likely benign. 3. Material and or secretions in RIGHT lower lobe bronchus. And RIGHT mainstem bronchus, correlate with any risk for aspiration. 4. Small pericardial effusion similar to previous study from June of 2021. 5. Three-vessel coronary artery disease. 6. Emphysema and aortic atherosclerosis. Aortic Atherosclerosis (ICD10-I70.0) and Emphysema (ICD10-J43.9). Electronically Signed   By: Zetta Bills M.D.   On: 11/04/2019 20:07   CT ANGIO CHEST PE W OR WO CONTRAST  Result Date: 10/26/2019 CLINICAL DATA:  Show of breath EXAM: CT ANGIOGRAPHY CHEST WITH CONTRAST TECHNIQUE: Multidetector CT imaging of the chest was performed using the standard protocol during bolus administration of intravenous contrast. Multiplanar CT image reconstructions and MIPs were obtained to evaluate the vascular anatomy. CONTRAST:  51mL OMNIPAQUE IOHEXOL 350 MG/ML SOLN COMPARISON:  Chest CT May 05, 2019; chest radiograph October 25, 2019 FINDINGS: Cardiovascular: There is no demonstrable pulmonary embolus. There is no appreciable thoracic aortic aneurysm or dissection. There are scattered foci of calcification in visualized great vessels. There are foci of atherosclerotic calcification in the aorta. There are multiple foci of coronary artery calcification. No pericardial effusion or pericardial thickening is evident. There is a pacemaker present with lead tips attached to the right ventricle and coronary sinus.  Mediastinum/Nodes: Thyroid  appears normal. There is a lymph node to the left of the distal trachea measuring 1.2 x 1.0 cm, unchanged from previous study. Scattered subcentimeter lymph nodes elsewhere noted. No esophageal lesions are evident. Lungs/Pleura: There is centrilobular emphysematous change, stable. Scarring in the anterior right upper lobe near the apex is stable. There are pleural effusions bilaterally, larger on the right than on the left, with bibasilar compressive atelectasis. There may be superimposed pneumonia in these areas of atelectasis. Upper Abdomen: In the visualized upper abdomen, there is aortic atherosclerosis. There are calcified splenic granulomas. Visualized upper abdominal structures otherwise appear unremarkable. Musculoskeletal: There is stable anterior wedging of the T10 vertebral body. No blastic or lytic bone lesions. No evident chest wall lesions. Pacemaker device on the left anteriorly. Review of the MIP images confirms the above findings. IMPRESSION: 1. No demonstrable pulmonary embolus. No thoracic aortic aneurysm or dissection. There is aortic atherosclerosis as well as foci of great vessel and coronary artery calcification. Pacemaker leads attached to right ventricle and coronary sinus. 2. Underlying centrilobular emphysematous change. Pleural effusions bilaterally, larger on the right than on the left, with compressive atelectasis in both lung bases. A degree of superimposed pneumonia in these areas of atelectasis cannot be entirely excluded. 3. Mildly prominent left paratracheal lymph node, stable. No new lymph node prominence. 4.  Calcified splenic granulomas. Aortic Atherosclerosis (ICD10-I70.0) and Emphysema (ICD10-J43.9). Electronically Signed   By: Lowella Grip III M.D.   On: 10/26/2019 09:09   US Venous Img Lower Unilateral Left (DVT)  Result Date: 10/26/2019 CLINICAL DATA:  Left lower extremity edema EXAM: Left LOWER EXTREMITY VENOUS DOPPLER ULTRASOUND  TECHNIQUE: Gray-scale sonography with graded compression, as well as color Doppler and duplex ultrasound were performed to evaluate the lower extremity deep venous systems from the level of the common femoral vein and including the common femoral, femoral, profunda femoral, popliteal and calf veins including the posterior tibial, peroneal and gastrocnemius veins when visible. The superficial great saphenous vein was also interrogated. Spectral Doppler was utilized to evaluate flow at rest and with distal augmentation maneuvers in the common femoral, femoral and popliteal veins. COMPARISON:  None. FINDINGS: Contralateral Common Femoral Vein: Respiratory phasicity is normal and symmetric with the symptomatic side. No evidence of thrombus. Normal compressibility. Common Femoral Vein: No evidence of thrombus. Normal compressibility, respiratory phasicity and response to augmentation. Saphenofemoral Junction: No evidence of thrombus. Normal compressibility and flow on color Doppler imaging. Profunda Femoral Vein: No evidence of thrombus. Normal compressibility and flow on color Doppler imaging. Femoral Vein: No evidence of thrombus. Normal compressibility, respiratory phasicity and response to augmentation. Popliteal Vein: No evidence of thrombus. Normal compressibility, respiratory phasicity and response to augmentation. Calf Veins: No evidence of thrombus. Normal compressibility and flow on color Doppler imaging. Superficial Great Saphenous Vein: No evidence of thrombus. Normal compressibility. Venous Reflux:  None. Other Findings:  None. IMPRESSION: No evidence of deep venous thrombosis. Electronically Signed   By: Prudencio Pair M.D.   On: 10/26/2019 04:17   US Venous Img Lower Unilateral Left  Addendum Date: 10/18/2019   ADDENDUM REPORT: 10/18/2019 01:53 ADDENDUM: These results were called by telephone at the time of interpretation on 10/18/2019 at 1:53 am to provider [facility] , who verbally acknowledged  these results. Electronically Signed   By: Lovena Le M.D.   On: 10/18/2019 01:53   Result Date: 10/18/2019 CLINICAL DATA:  Pain and swelling the left leg, history of CHF EXAM: LEFT LOWER EXTREMITY VENOUS DOPPLER ULTRASOUND TECHNIQUE: Gray-scale sonography  with compression, as well as color and duplex ultrasound, were performed to evaluate the deep venous system(s) from the level of the common femoral vein through the popliteal and proximal calf veins. COMPARISON:  None. FINDINGS: VENOUS There is hypoechoic incompletely compressible, nonocclusive, peripherally marginated filling defects likely reflecting thrombus in the left common femoral, femoral and popliteal vein as well as the posterior tibial and peroneal veins of the calf. Normal direction of color venous flow. Normal preservation of the respiratory phasicity. Limited views of the contralateral common femoral vein are unremarkable. OTHER Likely surgical absence of the left greater saphenous vein. Limitations: none IMPRESSION: Incompletely compressible, nonocclusive thrombus seen throughout the deep venous system of the left lower extremity. A peripherally marginated hypoechoic appearance may suggest chronicity though acute thrombus is not excluded. Surgical absence of left greater saphenous vein. Currently attempting to contact the ordering provider with a critical value result. Addendum will be submitted upon case discussion. Chest Electronically Signed: By: Lovena Le M.D. On: 10/18/2019 01:46   DG Chest Portable 1 View  Result Date: 10/25/2019 CLINICAL DATA:  Shortness of breath EXAM: PORTABLE CHEST 1 VIEW COMPARISON:  10/21/2019 FINDINGS: Cardiomegaly with biventricular ICD/pacer. Mild interstitial coarsening with a few Kerley lines and trace right pleural effusion. Emphysema. No consolidation or pneumothorax. IMPRESSION: Mild interstitial opacity which could be related to patient's emphysema or early pulmonary edema. Electronically Signed   By:  Monte Fantasia M.D.   On: 10/25/2019 07:50   DG Chest Port 1 View  Result Date: 10/21/2019 CLINICAL DATA:  Increased shortness of breath. EXAM: PORTABLE CHEST 1 VIEW COMPARISON:  Oct 17, 2019 FINDINGS: Stable pacemaker. Stable AICD device. The hila and mediastinum are unremarkable. No pneumothorax. No nodules or masses. There is a small right pleural effusion. Mild interstitial prominence centrally suggest pulmonary venous congestion. No overt edema. No focal infiltrate. No nodule or mass. IMPRESSION: 1. Findings are most consistent with cardiomegaly, pulmonary venous congestion, and a small right pleural effusion. Electronically Signed   By: Dorise Bullion III M.D   On: 10/21/2019 13:36   DG Foot Complete Left  Result Date: 10/17/2019 CLINICAL DATA:  Left foot pain. Technologist notes state gangrenous foot. Concern for osteomyelitis. Two toes amputated 10 days ago. EXAM: LEFT FOOT - COMPLETE 3+ VIEW COMPARISON:  None. FINDINGS: Resection of the first, second, and third digit. The first, second, and third metatarsal heads are eroded. There is adjacent soft tissue edema and mottled gas in the soft tissues. No evidence of osteomyelitis of the in situ fourth and fifth digit. There is generalized soft tissue edema. Advanced vascular calcifications. IMPRESSION: 1. 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 2. Soft tissue edema with mottled gas in the soft tissues suspicious for soft tissue infection. 3. Generalized soft tissue edema. Advanced vascular calcifications. Electronically Signed   By: Keith Rake M.D.   On: 10/17/2019 23:42   ECHOCARDIOGRAM COMPLETE  Result Date: 10/26/2019    ECHOCARDIOGRAM REPORT   Patient Name:   DAQUAN CRAPPS Date of Exam: 10/25/2019 Medical Rec #:  [id number]         Height:       73.0 in Accession #:    [id number]        Weight:       163.0 lb Date of Birth:  1955/05/31          BSA:          1.972 m Patient Age:    23 years  BP:           132/65 mmHg Patient Gender: M                 HR:           78 bpm. Exam Location:  [facility] Procedure: 2D Echo, Cardiac Doppler and Color Doppler Indications:     C16.60 Acute Diastolic CHF  History:         Patient has no prior history of Echocardiogram examinations.                  Defibrillator. Myocardial infarction. Peripheral vascular                  disease. Lung Emphysema. Dyspnea. Atrial Fibrillation. COPD.                  Coronary artery disease.  Sonographer:     Wilford Sports Rodgers-Jones Referring Phys:  [id number] NIU Diagnosing Phys: Bartholome Bill MD IMPRESSIONS  1. Left ventricular ejection fraction, by estimation, is 55 to 60%. Left ventricular ejection fraction by PLAX is 62 %. The left ventricle has normal function. The left ventricle demonstrates regional wall motion abnormalities (see scoring diagram/findings for description). Left ventricular diastolic parameters were normal.  2. Right ventricular systolic function is normal. The right ventricular size is normal.  3. Left atrial size was moderately dilated.  4. The mitral valve was not well visualized. Mild to moderate mitral valve regurgitation.  5. The aortic valve was not well visualized. Aortic valve regurgitation is trivial. FINDINGS  Left Ventricle: Left ventricular ejection fraction, by estimation, is 55 to 60%. Left ventricular ejection fraction by PLAX is 62 %. The left ventricle has normal function. The left ventricle demonstrates regional wall motion abnormalities. The left ventricular internal cavity size was normal in size. There is no left ventricular hypertrophy. Left ventricular diastolic parameters were normal.  LV Wall Scoring: The mid anteroseptal segment is hypokinetic. Right Ventricle: The right ventricular size is normal. No increase in right ventricular wall thickness. Right ventricular systolic function is normal. Left Atrium: Left atrial size was moderately dilated. Right Atrium: Right atrial size was  normal in size. Pericardium: There is no evidence of pericardial effusion. Mitral Valve: The mitral valve was not well visualized. Mild to moderate mitral valve regurgitation. Tricuspid Valve: The tricuspid valve is grossly normal. Tricuspid valve regurgitation is mild. Aortic Valve: The aortic valve was not well visualized. Aortic valve regurgitation is trivial. Pulmonic Valve: The pulmonic valve was not well visualized. Pulmonic valve regurgitation is not visualized. Aorta: The aortic root was not well visualized. IAS/Shunts: The interatrial septum was not assessed. Additional Comments: A pacer wire is visualized.  LEFT VENTRICLE PLAX 2D LV EF:         Left            Diastology                ventricular     LV e' lateral:   9.36 cm/s                ejection        LV E/e' lateral: 14.3                fraction by     LV e' medial:    11.70 cm/s                PLAX is 62      LV E/e' medial:  11.5                %. LVIDd:         7.26 cm LVIDs:         4.78 cm LV PW:         1.08 cm LV IVS:        0.74 cm  RIGHT VENTRICLE             IVC RV Basal diam:  4.04 cm     IVC diam: 2.27 cm RV S prime:     13.40 cm/s TAPSE (M-mode): 1.8 cm LEFT ATRIUM              Index       RIGHT ATRIUM           Index LA diam:        6.00 cm  3.04 cm/m  RA Area:     19.70 cm LA Vol (A2C):   108.0 ml 54.75 ml/m RA Volume:   57.20 ml  29.00 ml/m LA Vol (A4C):   93.0 ml  47.15 ml/m LA Biplane Vol: 105.0 ml 53.23 ml/m   AORTA Ao Root diam: 3.60 cm MV E velocity: 134.00 cm/s MV A velocity: 39.60 cm/s MV E/A ratio:  3.38 Bartholome Bill MD Electronically signed by Bartholome Bill MD Signature Date/Time: 10/26/2019/12:33:28 PM    Final     Assessment/Plan Hx of AKA (above knee amputation), right (HCC) Well healed  Essential hypertension, benign blood pressure control important in reducing the progression of atherosclerotic disease. On appropriate oral medications.  Chronic systolic CHF (congestive heart failure) (HCC) His most  recent echocardiogram showed a reduction in his ejection fraction down to about 30%. He he went for what was supposed to be a dual chamber pacemaker a few weeks ago but was found to already have that.  They did do some "tweaking" of the pacer and this may improve his function somewhat.  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  Interestingly, medially and laterally, the skin actually looks pretty healthy.  He has already undergone revascularization about a month ago. I had a long discussion with he and his wife today.  This is a difficult situation.  We discussed that the most likely outcome is probably a below-knee amputation.  With some healthy skin there, I think we may have a chance of allowing this eschar to slough and if there is not exposed bone we may be able to get tissue coverage and salvage the transmetatarsal amputation.  He wants to give this a chance and I think that is certainly a reasonable thing.  If this becomes infected or if his pain becomes intractable, we should go ahead and proceed with a below-knee amputation on the left.  For now, we will recheck him in about 2 weeks to do a wound check and see if there is any improvement.    Leotis Pain, MD  11/10/2019 11:27 AM    This note was created with Dragon medical transcription system.  Any errors from dictation are purely unintentional

## 2019-11-10 NOTE — Assessment & Plan Note (Signed)
His left transmetatarsal amputation has gangrenous change with eschar to the plantar flap over about half of it.  Interestingly, medially and laterally, the skin actually looks pretty healthy.  He has already undergone revascularization about a month ago. I had a long discussion with he and his wife today.  This is a difficult situation.  We discussed that the most likely outcome is probably a below-knee amputation.  With some healthy skin there, I think we may have a chance of allowing this eschar to slough and if there is not exposed bone we may be able to get tissue coverage and salvage the transmetatarsal amputation.  He wants to give this a chance and I think that is certainly a reasonable thing.  If this becomes infected or if his pain becomes intractable, we should go ahead and proceed with a below-knee amputation on the left.  For now, we will recheck him in about 2 weeks to do a wound check and see if there is any improvement.

## 2019-11-10 NOTE — Telephone Encounter (Signed)
I called and left a voice mail for the Home care nurse making her aware of the Wound care instructions.

## 2019-11-13 DIAGNOSIS — I110 Hypertensive heart disease with heart failure: Secondary | ICD-10-CM | POA: Diagnosis not present

## 2019-11-13 DIAGNOSIS — L03116 Cellulitis of left lower limb: Secondary | ICD-10-CM | POA: Diagnosis not present

## 2019-11-13 DIAGNOSIS — I70202 Unspecified atherosclerosis of native arteries of extremities, left leg: Secondary | ICD-10-CM | POA: Diagnosis not present

## 2019-11-13 DIAGNOSIS — Z4781 Encounter for orthopedic aftercare following surgical amputation: Secondary | ICD-10-CM | POA: Diagnosis not present

## 2019-11-13 DIAGNOSIS — Z4801 Encounter for change or removal of surgical wound dressing: Secondary | ICD-10-CM | POA: Diagnosis not present

## 2019-11-13 DIAGNOSIS — I5022 Chronic systolic (congestive) heart failure: Secondary | ICD-10-CM | POA: Diagnosis not present

## 2019-11-13 DIAGNOSIS — M869 Osteomyelitis, unspecified: Secondary | ICD-10-CM | POA: Diagnosis not present

## 2019-11-13 DIAGNOSIS — J439 Emphysema, unspecified: Secondary | ICD-10-CM | POA: Diagnosis not present

## 2019-11-13 DIAGNOSIS — B965 Pseudomonas (aeruginosa) (mallei) (pseudomallei) as the cause of diseases classified elsewhere: Secondary | ICD-10-CM | POA: Diagnosis not present

## 2019-11-16 ENCOUNTER — Telehealth (INDEPENDENT_AMBULATORY_CARE_PROVIDER_SITE_OTHER): Payer: Self-pay | Admitting: Vascular Surgery

## 2019-11-16 DIAGNOSIS — Z4801 Encounter for change or removal of surgical wound dressing: Secondary | ICD-10-CM | POA: Diagnosis not present

## 2019-11-16 DIAGNOSIS — J439 Emphysema, unspecified: Secondary | ICD-10-CM | POA: Diagnosis not present

## 2019-11-16 DIAGNOSIS — I70202 Unspecified atherosclerosis of native arteries of extremities, left leg: Secondary | ICD-10-CM | POA: Diagnosis not present

## 2019-11-16 DIAGNOSIS — Z4781 Encounter for orthopedic aftercare following surgical amputation: Secondary | ICD-10-CM | POA: Diagnosis not present

## 2019-11-16 DIAGNOSIS — B965 Pseudomonas (aeruginosa) (mallei) (pseudomallei) as the cause of diseases classified elsewhere: Secondary | ICD-10-CM | POA: Diagnosis not present

## 2019-11-16 DIAGNOSIS — I110 Hypertensive heart disease with heart failure: Secondary | ICD-10-CM | POA: Diagnosis not present

## 2019-11-16 DIAGNOSIS — L03116 Cellulitis of left lower limb: Secondary | ICD-10-CM | POA: Diagnosis not present

## 2019-11-16 DIAGNOSIS — M869 Osteomyelitis, unspecified: Secondary | ICD-10-CM | POA: Diagnosis not present

## 2019-11-16 DIAGNOSIS — I5022 Chronic systolic (congestive) heart failure: Secondary | ICD-10-CM | POA: Diagnosis not present

## 2019-11-16 NOTE — Telephone Encounter (Signed)
His Nurse Reine Just stating that his wound has changed for the worst and she thinks that he should come in to be seen ASAP.  Original appt was 7/2 for a wound check, there was an opening tomorrow with JD and I spoke to LG and she said it was okay for him to come in to be seen. Patient was last seen 11-10-19 with a F/U. This note is just for documentation purposes only.

## 2019-11-17 ENCOUNTER — Ambulatory Visit (INDEPENDENT_AMBULATORY_CARE_PROVIDER_SITE_OTHER): Payer: Medicare HMO | Admitting: Vascular Surgery

## 2019-11-17 ENCOUNTER — Other Ambulatory Visit: Payer: Self-pay

## 2019-11-17 ENCOUNTER — Telehealth (INDEPENDENT_AMBULATORY_CARE_PROVIDER_SITE_OTHER): Payer: Self-pay

## 2019-11-17 ENCOUNTER — Encounter (INDEPENDENT_AMBULATORY_CARE_PROVIDER_SITE_OTHER): Payer: Self-pay

## 2019-11-17 ENCOUNTER — Telehealth: Payer: Self-pay | Admitting: Acute Care

## 2019-11-17 ENCOUNTER — Encounter (INDEPENDENT_AMBULATORY_CARE_PROVIDER_SITE_OTHER): Payer: Self-pay | Admitting: Vascular Surgery

## 2019-11-17 VITALS — BP 122/75 | HR 76 | Resp 16

## 2019-11-17 DIAGNOSIS — T17908A Unspecified foreign body in respiratory tract, part unspecified causing other injury, initial encounter: Secondary | ICD-10-CM

## 2019-11-17 DIAGNOSIS — I7025 Atherosclerosis of native arteries of other extremities with ulceration: Secondary | ICD-10-CM | POA: Diagnosis not present

## 2019-11-17 DIAGNOSIS — Z89611 Acquired absence of right leg above knee: Secondary | ICD-10-CM | POA: Diagnosis not present

## 2019-11-17 DIAGNOSIS — J449 Chronic obstructive pulmonary disease, unspecified: Secondary | ICD-10-CM | POA: Diagnosis not present

## 2019-11-17 DIAGNOSIS — Z9189 Other specified personal risk factors, not elsewhere classified: Secondary | ICD-10-CM

## 2019-11-17 DIAGNOSIS — I10 Essential (primary) hypertension: Secondary | ICD-10-CM

## 2019-11-17 DIAGNOSIS — D6851 Activated protein C resistance: Secondary | ICD-10-CM

## 2019-11-17 MED ORDER — AMOXICILLIN-POT CLAVULANATE 875-125 MG PO TABS: 1.0000 | ORAL_TABLET | Freq: Two times a day (BID) | ORAL | 0 refills | Status: DC

## 2019-11-17 NOTE — Patient Instructions (Signed)
Leg Amputation, Care After This sheet gives you information about how to care for yourself after your procedure. Your health care provider may also give you more specific instructions. If you have problems or questions, contact your health care provider. What can I expect after the procedure? After the procedure, it is common to have:  A little blood or fluid coming from your incision.  Pain from your incision.  Pain that feels like it is coming from the leg that has been removed (phantom pain). This can last for a year or longer.  Skin breakdown on your stump (residual limb).  Feelings of depression, anxiety, and fear. Follow these instructions at home: Medicines  Take over-the-counter and prescription medicines only as told by your health care provider.  If you were prescribed an antibiotic medicine, take it as told by your health care provider. Do not stop taking the antibiotic even if you start to feel better. Bathing  Do not take baths, swim, use a hot tub, or get your residual limb wet until your health care provider approves. You may only be allowed to take sponge baths.  Ask your health care provider when you may start taking showers. After taking a shower, make sure to rinse and dry your residual limb carefully. Incision care   Check your residual limb, especially your incision area, every day. Check for: ? More redness, swelling, or pain. ? More fluid or blood. ? Warmth. ? Pus or a bad smell. ? Blisters. ? Scrapes.  Follow instructions from your health care provider about how to take care of your incision. Make sure you: ? Wash your hands with soap and water before you change your bandage (dressing). If soap and water are not available, use hand sanitizer. ? Change your dressing as told by your health care provider. ? Leave stitches (sutures), skin glue, or adhesive strips in place. These skin closures may need to stay in place for 2 weeks or longer. If adhesive strip  edges start to loosen and curl up, you may trim the loose edges. Do not remove adhesive strips completely unless your health care provider tells you to do that. Activity  Return to your normal activities as told by your health care provider. Ask your health care provider what activities are safe for you.  Do physical therapy exercises as told by your health care provider.  If you have been fitted with an artificial leg (prosthesis) or have been given crutches, use them as told by your health care provider. Eating and drinking  Eat a healthy diet that includes whole grains, fruits and vegetables, low-fat dairy products, and lean proteins.  Drink enough fluid to keep your urine pale yellow. Driving  Work with an occupational therapist to learn new strategies for safe driving with an amputation.  Do not drive or use heavy equipment while taking prescription pain medicine. General instructions  To prevent or treat constipation while you are taking prescription pain medicine, your health care provider may recommend that you: ? Drink enough fluid to keep your urine pale yellow. ? Take over-the-counter or prescription medicines. ? Eat foods that are high in fiber, such as fresh fruits and vegetables, whole grains, and beans. ? Limit foods that are high in fat and processed sugars, such as fried and sweet foods.  Do not use oils, lotion, cream, or rubbing alcohol on the remaining part of your leg.  Wear compression stockings as told by your health care provider.  If you have trouble coping   with your amputation, contact your health care provider. Some feelings of depression, anxiety, or fear are normal after an amputation, but if you struggle with these feelings or if they get overwhelming, your provider may be able to recommend a therapist or support group to help you.  Do not use any products that contain nicotine or tobacco, such as cigarettes and e-cigarettes. These can delay bone healing.  If you need help quitting, ask your health care provider.  Keep all follow-up visits as told by your health care provider. This is important. Contact a health care provider if:  You have a fever.  You have more tenderness in your residual limb.  You have a rash or itchy skin.  You have a cough or chills and you feel achy and weak.  You have trouble coping with your amputation.  You have blisters or scrapes on your residual limb. Get help right away if:  You have severe pain in your residual limb.  You have more redness, swelling, or pain around your incision.  You have more fluid or blood coming from your incision.  Your incision feels warm to the touch, tender, and painful.  You have pus or a bad smell coming from your incision.  You feel light-headed and have shortness of breath.  You have blood-soaked bandages.  You cough up blood.  You have chest pain or pain when taking a deep breath or coughing. If you have these symptoms, do not drive yourself to the hospital. Call emergency services right away. If you ever feel like you may hurt yourself or others, or have thoughts about taking your own life, get help right away. You can go to your nearest emergency department or call:  Your local emergency services (911 in the [place]  A suicide crisis helpline, such as the National Suicide Prevention Lifeline at 1-800-273-8255. This is open 24 hours a day. Summary  After a leg amputation, you may have pain that feels like it is coming from the leg that was removed (phantom pain). This can last for a year or longer.  Follow instructions from your health care provider about how to take care of your incision.  Check your residual limb, especially your incision area, every day. More redness, swelling, or pain may be a sign of infection.  Contact your health care provider if you have trouble coping with your amputation. This information is not intended to replace advice given to  you by your health care provider. Make sure you discuss any questions you have with your health care provider. Document Revised: 08/19/2016 Document Reviewed: 08/19/2016 [employer] Patient Education  2020 [employer]  

## 2019-11-17 NOTE — Telephone Encounter (Signed)
I see mention of the need for HST in 11/08/2019 OV, however it does not appear that this was ordered.  Per pt's chart, it also appears that Judson Roch wanted for pt to have swallow study. It does not appear this was ordered either.  HST has been ordered.  Judson Roch, would you like a barium swallow?

## 2019-11-17 NOTE — Telephone Encounter (Signed)
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 Patient will do a pre-op phone call on 11/20/19 between 8-1 pm and covid testing on 11/21/19 between 8-1 pm at the [facility]. Pre-procedure instructions were discussed and will be mailed.

## 2019-11-17 NOTE — Assessment & Plan Note (Signed)
His left forefoot is now become red and a little more necrotic.  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  We had a long discussion today and I think this is really the only reasonable option going forward.  I will give him some antibiotics to try to quell the infection until we do his amputation.  He and his wife voiced their understanding and are agreeable to proceed with left below-knee amputation.

## 2019-11-17 NOTE — Progress Notes (Signed)
MRN : [id number]  Tyler [name] is a 64 y.o. (01-Apr-1956) male who presents with chief complaint of  Chief Complaint  Patient presents with  . Follow-up    wound check  .  History of Present Illness: Patient returns today in follow up of his PAD and left foot ulceration and gangrenous changes.  He returns prior to scheduled follow-up visit due to worsening problems.  Since his last visit, it is become more painful and swollen as well as more red.  The wound is breaking down a little more.  We were trying to conservatively manage his ulceration of the left transmetatarsal amputation, but it is clearly failing at this point.  Current Outpatient Medications  Medication Sig Dispense Refill  . acetaminophen (TYLENOL) 500 MG tablet Take 1,000 mg by mouth daily as needed for moderate pain or headache.    . ALPRAZolam (XANAX) 1 MG tablet Take 1 mg by mouth 2 (two) times daily as needed for anxiety or sleep.     Marland Kitchen aspirin EC 81 MG tablet Take 1 tablet (81 mg total) by mouth daily. (Patient taking differently: Take 81 mg by mouth daily after supper. ) 150 tablet 2  . citalopram (CELEXA) 10 MG tablet Take 10 mg by mouth daily.    Marland Kitchen docusate sodium (COLACE) 100 MG capsule Take 100 mg by mouth daily as needed (for constipation.).     Marland Kitchen ENTRESTO 24-26 MG Take 1 tablet by mouth 2 (two) times daily. 60 tablet 0  . fluticasone (FLONASE) 50 MCG/ACT nasal spray Place 2 sprays into both nostrils daily.    . fluticasone-salmeterol (ADVAIR HFA) 115-21 MCG/ACT inhaler USE 2 INHALATIONS ORALLY EVERY 12 HOURS (Patient taking differently: Inhale 2 puffs into the lungs every 12 (twelve) hours. ) 36 Inhaler 5  . furosemide (LASIX) 20 MG tablet Take 2 tablets (40 mg total) by mouth daily. 60 tablet 0  . gabapentin (NEURONTIN) 100 MG capsule Take 300 mg by mouth in the morning, at noon, and at bedtime.    Marland Kitchen guaiFENesin (MUCINEX) 600 MG 12 hr tablet Take 600 mg by mouth every evening.     Marland Kitchen ipratropium (ATROVENT)  0.03 % nasal spray Place 2 sprays into both nostrils 2 (two) times daily.     . Ipratropium-Albuterol (COMBIVENT RESPIMAT) 20-100 MCG/ACT AERS respimat Inhale 1 puff into the lungs every 4 (four) hours.     Marland Kitchen levalbuterol (XOPENEX) 1.25 MG/3ML nebulizer solution Take 1.25 mg (3 mLs total) by nebulization every 4 (four) hours. (Patient taking differently: Take 3 mLs by nebulization every 4 (four) hours as needed for wheezing or shortness of breath. ) 72 mL 12  . loratadine (CLARITIN) 10 MG tablet Take 10 mg by mouth at bedtime.    . lovastatin (MEVACOR) 20 MG tablet Take 20 mg by mouth at bedtime.    . metoprolol succinate (TOPROL-XL) 25 MG 24 hr tablet Take 25 mg by mouth daily.    . nitroGLYCERIN (NITROSTAT) 0.4 MG SL tablet Place 0.4 mg under the tongue every 5 (five) minutes as needed for chest pain.    Marland Kitchen oxyCODONE-acetaminophen (PERCOCET) 7.5-325 MG tablet Take 1 tablet by mouth every 6 (six) hours as needed for moderate pain.     . pantoprazole (PROTONIX) 40 MG tablet Take 40 mg by mouth daily before breakfast.     . Respiratory Therapy Supplies (FLUTTER) DEVI Use as directed. 1 each 0  . SPIRIVA RESPIMAT 1.25 MCG/ACT AERS INHALE 2 PUFFS BY MOUTH INTO THE  LUNGS DAILY (Patient taking differently: Inhale 2 puffs into the lungs daily. ) 4 g 6  . tamsulosin (FLOMAX) 0.4 MG CAPS capsule Take 0.4 mg by mouth daily after supper.   11  . vitamin B-12 (CYANOCOBALAMIN) 1000 MCG tablet Take 1,000 mcg by mouth daily.    Marland Kitchen warfarin (COUMADIN) 5 MG tablet Take 5 mg by mouth daily.    . Wound Dressings (RESTORE WOUND CARE DRESSING) PADS Apply 1 each topically daily.      No current facility-administered medications for this visit.    Past Medical History:  Diagnosis Date  . AICD (automatic cardioverter/defibrillator) present   . Anxiety   . Arthritis   . Atherosclerosis of artery of extremity with ulceration ([place]) 09/2019   left foot s/p toe amp requiring debridement and futher toe amputations.  .  Atrial fibrillation ([place])   . Cervical spinal stenosis    with neuropathy  . CHF (congestive heart failure) ([place])   . Constipation   . COPD (chronic obstructive pulmonary disease) ([place])   . Coronary artery disease   . Depression   . Dyspnea   . Dysrhythmia    atrial fibrillation  . Emphysema of lung ([place])   . GERD (gastroesophageal reflux disease)   . Hypertension   . Lung nodule seen on imaging study    being followed by dr. [name]. just watching it for last few years, without change  . Myocardial infarction [facility]) 2004   stent placed, pacemaker implanted 2005  . Oxygen dependent    requires 2L nasal prong oxygen 24 hours a day  . Peripheral vascular disease ([place])   . Presence of permanent cardiac pacemaker [id number]    Past Surgical History:  Procedure Laterality Date  . ABOVE KNEE LEG AMPUTATION Right    after below the knee amputation   . AMPUTATION TOE Left 08/04/2019   Procedure: AMPUTATION TOE MPJ LEFT;  Surgeon: Samara Deist, DPM;  Location: [facility];  Service: Podiatry;  Laterality: Left;  . AMPUTATION TOE Left 10/06/2019   Procedure: AMPUTATION TOE MPJ T1,T2 LEFT;  Surgeon: Samara Deist, DPM;  Location: [facility];  Service: Podiatry;  Laterality: Left;  . APPLICATION OF WOUND VAC Left 01/12/2018   Procedure: APPLICATION OF WOUND VAC;  Surgeon: Algernon Huxley, MD;  Location: [facility];  Service: Vascular;  Laterality: Left;  . BELOW KNEE LEG AMPUTATION Right   . CATARACT EXTRACTION, BILATERAL Bilateral   . ENDARTERECTOMY FEMORAL Left 08/11/2017   Procedure: ENDARTERECTOMY FEMORAL;  Surgeon: Algernon Huxley, MD;  Location: [facility];  Service: Vascular;  Laterality: Left;  . HEMATOMA EVACUATION Left 01/12/2018   Procedure: EVACUATION HEMATOMA ( DRAINING OF SEROMA);  Surgeon: Algernon Huxley, MD;  Location: [facility];  Service: Vascular;  Laterality: Left;  . IMPLANTABLE CARDIOVERTER DEFIBRILLATOR (ICD) GENERATOR CHANGE Left 02/10/2017   Procedure: ICD GENERATOR CHANGE;  Surgeon:  Isaias Cowman, MD;  Location: [facility];  Service: Cardiovascular;  Laterality: Left;  . INSERT / REPLACE / REMOVE PACEMAKER  [id number]  . LOWER EXTREMITY ANGIOGRAPHY Left 06/07/2017   Procedure: LOWER EXTREMITY ANGIOGRAPHY;  Surgeon: Algernon Huxley, MD;  Location: [place] [facility];  Service: Cardiovascular;  Laterality: Left;  . LOWER EXTREMITY ANGIOGRAPHY Left 10/05/2019   Procedure: LOWER EXTREMITY ANGIOGRAPHY;  Surgeon: Algernon Huxley, MD;  Location: [place] [facility];  Service: Cardiovascular;  Laterality: Left;  . LOWER EXTREMITY INTERVENTION  06/07/2017   Procedure: LOWER EXTREMITY INTERVENTION;  Surgeon: Algernon Huxley, MD;  Location: [facility]  INVASIVE CV LAB;  Service: Cardiovascular;;  . PERIPHERAL VASCULAR CATHETERIZATION Left 12/09/2015   Procedure: Lower Extremity Angiography;  Surgeon: Algernon Huxley, MD;  Location: [place] [facility];  Service: Cardiovascular;  Laterality: Left;  . PERIPHERAL VASCULAR CATHETERIZATION  12/09/2015   Procedure: Lower Extremity Intervention;  Surgeon: Algernon Huxley, MD;  Location: [place] [facility];  Service: Cardiovascular;;  . TONSILLECTOMY    . TRANSMETATARSAL AMPUTATION Left 10/20/2019   Procedure: TRANSMETATARSAL AMPUTATION;  Surgeon: Sharlotte Alamo, DPM;  Location: [facility];  Service: Podiatry;  Laterality: Left;    Social History        Tobacco Use  . Smoking status: Former Smoker    Packs/day: 1.00    Years: 42.00    Pack years: 42.00    Types: Cigarettes    Quit date: 09/23/2013    Years since quitting: 6.1  . Smokeless tobacco: Never Used  Vaping Use  . Vaping Use: Never used  Substance Use Topics  . Alcohol use: No  . Drug use: No           Family History  Problem Relation Age of Onset  . Cancer Mother   . Cancer Father   . Heart disease Father   no bleeding or clotting disorders      Allergies  Allergen Reactions  . Apixaban Rash  . Rivaroxaban Rash   REVIEW OF SYSTEMS(Negative unless  checked)  Constitutional: [] ?????Weight loss [] ?????Fever [] ?????Chills Cardiac: [] ?????Chest pain [] ?????Chest pressure [] ?????Palpitations [] ?????Shortness of breath when laying flat [] ?????Shortness of breath at rest [] ?????Shortness of breath with exertion. Vascular: [] ?????Pain in legs with walking [] ?????Pain in legs at rest [] ?????Pain in legs when laying flat [] ?????Claudication [] ?????Pain in feet when walking [] ?????Pain in feet at rest [] ?????Pain in feet when laying flat [] ?????History of DVT [] ?????Phlebitis [] ?????Swelling in legs [] ?????Varicose veins [x] ?????Non-healing ulcers Pulmonary: [x] ?????Uses home oxygen [] ?????Productive cough [] ?????Hemoptysis [] ?????Wheeze [x] ?????COPD [] ?????Asthma Neurologic: [] ?????Dizziness [] ?????Blackouts [] ?????Seizures [] ?????History of stroke [] ?????History of TIA [] ?????Aphasia [] ?????Temporary blindness [] ?????Dysphagia [] ?????Weakness or numbness in arms [] ?????Weakness or numbness in legs Musculoskeletal: [] ?????Arthritis [] ?????Joint swelling [] ?????Joint pain [x] ?????Low back pain Hematologic: [] ?????Easy bruising [] ?????Easy bleeding [] ?????Hypercoagulable state [] ?????Anemic  Gastrointestinal: [] ?????Blood in stool [] ?????Vomiting blood [] ?????Gastroesophageal reflux/heartburn [] ?????Abdominal pain Genitourinary: [] ?????Chronic kidney disease [] ?????Difficult urination [] ?????Frequent urination [] ?????Burning with urination [] ?????Hematuria Skin: [] ?????Rashes [x] ?????Ulcers [x] ?????Wounds Psychological: [] ?????History of anxiety [] ?????History of major depression.    Physical Examination  BP 122/75 (BP Location: Right Arm)   Pulse 76   Resp 16  Gen:  WD/WN, NAD Head: [place]/AT, No temporalis wasting. Ear/Nose/Throat: Hearing grossly intact, nares w/o erythema or drainage Eyes: Conjunctiva clear. Sclera non-icteric Neck: Supple.  Trachea  midline Pulmonary:  Good air movement, no use of accessory muscles on supplemental oxygen.  Cardiac: Irregular Vascular:  Vessel Right Left  Radial Palpable Palpable                          PT  not palpable  not palpable  DP  not palpable  not palpable   Gastrointestinal: soft, non-tender/non-distended. No guarding/reflex.  Musculoskeletal: M/S 5/5 throughout.  In a wheelchair.  Right AKA well-healed.  Wound is enlarged on the left transmetatarsal amputation with mild to moderate erythema and 2+ left lower extremity edema. Neurologic: Sensation grossly intact in extremities.  Symmetrical.  Speech is fluent.  Psychiatric: Judgment intact, Mood & affect appropriate for pt's clinical situation. Dermatologic: Left transmetatarsal amputation as above       Labs Recent Results (from the past  2160 hour(s))  SARS CORONAVIRUS 2 (TAT 6-24 HRS) Nasopharyngeal Nasopharyngeal Swab     Status: None   Collection Time: 10/04/19 10:23 AM   Specimen: Nasopharyngeal Swab  Result Value Ref Range   SARS Coronavirus 2 NEGATIVE NEGATIVE    Comment: (NOTE) SARS-CoV-2 target nucleic acids are NOT DETECTED. The SARS-CoV-2 RNA is generally detectable in upper and lower respiratory specimens during the acute phase of infection. Negative results do not preclude SARS-CoV-2 infection, do not rule out co-infections with other pathogens, and should not be used as the sole basis for treatment or other patient management decisions. Negative results must be combined with clinical observations, patient history, and epidemiological information. The expected result is Negative. Fact Sheet for Patients: SugarRoll.be Fact Sheet for Healthcare Providers: https://www.woods-mathews.com/ This test is not yet approved or cleared by the [place] FDA and  has been authorized for detection and/or diagnosis of SARS-CoV-2 by FDA under an Emergency Use Authorization (EUA).  This EUA will remain  in effect (meaning this test can be used) for the duration of the COVID-19 declaration under Section 56 4(b)(1) of the Act, 21 U.S.C. section 360bbb-3(b)(1), unless the authorization is terminated or revoked sooner. Performed at [facility], [place] [address], [place], [place] [address]   BUN     Status: None   Collection Time: 10/05/19  1:41 PM  Result Value Ref Range   BUN 9 8 - 23 mg/dL    Comment: Performed at [facility], [address] [place], [place] [address]  Creatinine, serum     Status: None   Collection Time: 10/05/19  1:41 PM  Result Value Ref Range   Creatinine, Ser 0.67 0.61 - 1.24 mg/dL   GFR calc non Af Amer >60 >60 mL/min   GFR calc Af Amer >60 >60 mL/min    Comment: Performed at [facility], [address] [place], [place] [address]  Protime-INR     Status: None   Collection Time: 10/05/19  1:41 PM  Result Value Ref Range   Prothrombin Time 13.6 11.4 - 15.2 seconds   INR 1.1 0.8 - 1.2    Comment: (NOTE) INR goal varies based on device and disease states. Performed at [facility], [address] [place], [place] [address]   Protime-INR     Status: None   Collection Time: 10/06/19  8:00 AM  Result Value Ref Range   Prothrombin Time 13.7 11.4 - 15.2 seconds   INR 1.1 0.8 - 1.2    Comment: (NOTE) INR goal varies based on device and disease states. Performed at [facility], [address] [place], [place] [address]   APTT     Status: Abnormal   Collection Time: 10/06/19  8:00 AM  Result Value Ref Range   aPTT 40 (H) 24 - 36 seconds    Comment:        IF BASELINE aPTT IS ELEVATED, SUGGEST PATIENT RISK ASSESSMENT BE USED TO DETERMINE APPROPRIATE ANTICOAGULANT THERAPY. Performed at [facility], [address] [place], [place] [address]   Aerobic/Anaerobic Culture (surgical/deep wound)     Status: None   Collection Time: 10/06/19  9:15 AM   Specimen: PATH Other; Tissue   Result Value Ref Range   Specimen Description      TISSUE Performed at [facility], [address] [place],  [address]    Special Requests      NONE Performed at [facility], [address],  [place], [place] [address]    Gram Stain      NO WBC SEEN RARE GRAM POSITIVE COCCI RARE GRAM NEGATIVE RODS    Culture      MODERATE PSEUDOMONAS AERUGINOSA FEW METHICILLIN RESISTANT STAPHYLOCOCCUS AUREUS FEW BACTEROIDES THETAIOTAOMICRON BETA LACTAMASE POSITIVE Performed at [facility], [place] [address], [place], [place] [address]    Report Status 10/10/2019 FINAL    Organism ID, Bacteria PSEUDOMONAS AERUGINOSA    Organism ID, Bacteria METHICILLIN RESISTANT STAPHYLOCOCCUS AUREUS       Susceptibility   Methicillin resistant staphylococcus aureus - MIC*    CIPROFLOXACIN <=0.5 SENSITIVE Sensitive     ERYTHROMYCIN >=8 RESISTANT Resistant     GENTAMICIN <=0.5 SENSITIVE Sensitive     OXACILLIN RESISTANT Resistant     TETRACYCLINE 2 SENSITIVE Sensitive     VANCOMYCIN 1 SENSITIVE Sensitive     TRIMETH/SULFA <=10 SENSITIVE Sensitive     CLINDAMYCIN <=0.25 SENSITIVE Sensitive     RIFAMPIN <=0.5 SENSITIVE Sensitive     Inducible Clindamycin NEGATIVE Sensitive     * FEW METHICILLIN RESISTANT STAPHYLOCOCCUS AUREUS   Pseudomonas aeruginosa - MIC*    CEFTAZIDIME 2 SENSITIVE Sensitive     CIPROFLOXACIN <=0.25 SENSITIVE Sensitive     GENTAMICIN <=1 SENSITIVE Sensitive     IMIPENEM 2 SENSITIVE Sensitive     PIP/TAZO 8 SENSITIVE Sensitive     CEFEPIME <=1 SENSITIVE Sensitive     * MODERATE PSEUDOMONAS AERUGINOSA  Surgical pathology     Status: None   Collection Time: 10/06/19 11:20 AM  Result Value Ref Range   SURGICAL PATHOLOGY      SURGICAL PATHOLOGY CASE: [id number] PATIENT: Tyler [name] Surgical Pathology Report     Specimen Submitted: A. Foot, left great, 2nd, and 3rd toes  Clinical History: I73.9 peripheral vascular disease, L97.524 ulcer  left foot, I96 gangrene of toe.      DIAGNOSIS: A. RIGHT FOOT, GREAT TOE, SECOND, AND THIRD TOES; AMPUTATION: - ACUTE OSTEOMYELITIS. - SEVERE CALCIFIC ATHEROSCLEROSIS. - SKIN AND SOFT TISSUE WITH ULCERATION, ABSCESS, AND NECROSIS. - ACTIVE INFLAMMATION IS PRESENT AT THE SKIN/SOFT TISSUE RESECTION MARGIN. - BONE MARGIN IS NEGATIVE FOR ACTIVE INFLAMMATION. - NEGATIVE FOR MALIGNANCY.   GROSS DESCRIPTION: A. Labeled: Great toe, second, and third toes - left foot Received: Formalin Tissue fragment(s): 1 Size: 7.0 x 6.5 x 2.5 cm Description: Received is an amputation specimen consisting of digits #1-#3 of the left foot.  The skin/soft tissue resection margin is inked blue.  The proximal bone resection margins are inked black.  Digit #1 ha s been previously amputated with a portion of skin remaining.  This skin has a 2.4 x 2.7 cm area of yellow discoloration, softening, and necrosis.  The yellow discoloration involves the skin/soft tissue resection margin.  The distal aspect of digit #2 displays a 3.5 x 2.0 cm area of brown discoloration and necrosis, 1.7 cm from the closest skin surgical resection margin.  Digit #3 displays a 6.3 x 4.5 cm area of brown discoloration and necrosis, 0.3 cm from the closest surgical resection margin.  Toenails are absent. Representative sections are submitted following decalcification as follows: 1 - digit #1 displaying residual, necrotic skin in relation to skin/soft tissue resection margin 2-3 - digit #2 displaying brown discolored and necrotic skin with underlying bone 4-5 - digit #3 displaying brown discolored and necrotic skin and underlying bone in relation to skin/soft tissue resection margin 6-7 - representative, perpendicular sections in relation to digit #1 proxim al bone resection margin  8 - entire, trisected digit #2 proximal bone resection margin 9 - entire, trisected digit #3 proximal bone resection margin  Final Diagnosis performed  by Betsy Pries, MD.   Electronically signed 10/10/2019 11:52:24AM The electronic signature indicates that the named Attending Pathologist has evaluated the specimen Technical component performed at [facility], [address], [place], [place] [address] Lab: [phone] Dir: Rush Farmer, MD, MMM  Professional component performed at [facility], [facility], [place], [place], [place] [address] Lab: [phone] Dir: Dellia Nims. Rubinas, MD   Comprehensive metabolic panel     Status: Abnormal   Collection Time: 10/17/19  5:20 PM  Result Value Ref Range   Sodium 126 (L) 135 - 145 mmol/L   Potassium 4.7 3.5 - 5.1 mmol/L   Chloride 88 (L) 98 - 111 mmol/L   CO2 29 22 - 32 mmol/L   Glucose, Bld 102 (H) 70 - 99 mg/dL    Comment: Glucose reference range applies only to samples taken after fasting for at least 8 hours.   BUN 6 (L) 8 - 23 mg/dL   Creatinine, Ser 0.71 0.61 - 1.24 mg/dL   Calcium 8.9 8.9 - 10.3 mg/dL   Total Protein 6.7 6.5 - 8.1 g/dL   Albumin 3.3 (L) 3.5 - 5.0 g/dL   AST 23 15 - 41 U/L   ALT 14 0 - 44 U/L   Alkaline Phosphatase 69 38 - 126 U/L   Total Bilirubin 0.5 0.3 - 1.2 mg/dL   GFR calc non Af Amer >60 >60 mL/min   GFR calc Af Amer >60 >60 mL/min   Anion gap 9 5 - 15    Comment: Performed at [facility], [address] [place], [place] [address]  Lactic acid, plasma     Status: None   Collection Time: 10/17/19  5:20 PM  Result Value Ref Range   Lactic Acid, Venous 0.9 0.5 - 1.9 mmol/L    Comment: Performed at [facility], [address] [place], [place] [address]  CBC with Differential     Status: Abnormal   Collection Time: 10/17/19  5:20 PM  Result Value Ref Range   WBC 10.2 4.0 - 10.5 K/uL   RBC 3.47 (L) 4.22 - 5.81 MIL/uL   Hemoglobin 10.0 (L) 13.0 - 17.0 g/dL   HCT 30.4 (L) 39 - 52 %   MCV 87.6 80.0 - 100.0 fL   MCH 28.8 26.0 - 34.0 pg   MCHC 32.9 30.0 - 36.0 g/dL   RDW 13.0 11.5 - 15.5 %   Platelets 362 150 -  400 K/uL   nRBC 0.0 0.0 - 0.2 %   Neutrophils Relative % 76 %   Neutro Abs 7.6 1.7 - 7.7 K/uL   Lymphocytes Relative 10 %   Lymphs Abs 1.1 0.7 - 4.0 K/uL   Monocytes Relative 12 %   Monocytes Absolute 1.2 (H) 0 - 1 K/uL   Eosinophils Relative 2 %   Eosinophils Absolute 0.2 0 - 0 K/uL   Basophils Relative 0 %   Basophils Absolute 0.0 0 - 0 K/uL   Immature Granulocytes 0 %   Abs Immature Granulocytes 0.04 0.00 - 0.07 K/uL    Comment: Performed at [facility], [address] [place], [place] [address]  Protime-INR     Status: Abnormal   Collection Time: 10/17/19  5:20 PM  Result Value Ref Range   Prothrombin Time 24.5 (H) 11.4 - 15.2 seconds   INR 2.3 (H) 0.8 -  1.2    Comment: (NOTE) INR goal varies based on device and disease states. Performed at [facility], [address] [place], [place] [address]   Culture, blood (Routine x 2)     Status: None   Collection Time: 10/17/19  5:20 PM   Specimen: BLOOD  Result Value Ref Range   Specimen Description BLOOD RIGHT ANTECUBITAL    Special Requests      BOTTLES DRAWN AEROBIC AND ANAEROBIC Blood Culture adequate volume   Culture      NO GROWTH 5 DAYS Performed at [facility], [address] [place], [place] [address]    Report Status 10/22/2019 FINAL   Culture, blood (Routine x 2)     Status: None   Collection Time: 10/17/19  5:25 PM   Specimen: BLOOD LEFT HAND  Result Value Ref Range   Specimen Description BLOOD LEFT HAND    Special Requests      BOTTLES DRAWN AEROBIC AND ANAEROBIC Blood Culture adequate volume   Culture      NO GROWTH 5 DAYS Performed at [facility], [address], [place], [place] [address]    Report Status 10/23/2019 FINAL   Lactic acid, plasma     Status: None   Collection Time: 10/17/19  7:20 PM  Result Value Ref Range   Lactic Acid, Venous 1.2 0.5 - 1.9 mmol/L    Comment: Performed at [facility], [address] [place], [place] [address]   Urinalysis, Complete w Microscopic     Status: Abnormal   Collection Time: 10/18/19  6:30 AM  Result Value Ref Range   Color, Urine YELLOW (A) YELLOW   APPearance CLEAR (A) CLEAR   Specific Gravity, Urine 1.006 1.005 - 1.030   pH 6.0 5.0 - 8.0   Glucose, UA NEGATIVE NEGATIVE mg/dL   Hgb urine dipstick SMALL (A) NEGATIVE   Bilirubin Urine NEGATIVE NEGATIVE   Ketones, ur NEGATIVE NEGATIVE mg/dL   Protein, ur NEGATIVE NEGATIVE mg/dL   Nitrite NEGATIVE NEGATIVE   Leukocytes,Ua NEGATIVE NEGATIVE   RBC / HPF 0-5 0 - 5 RBC/hpf   WBC, UA 0-5 0 - 5 WBC/hpf   Bacteria, UA NONE SEEN NONE SEEN   Squamous Epithelial / LPF NONE SEEN 0 - 5    Comment: Performed at [facility], [address] [place], [place] [address]  HIV Antibody (routine testing w rflx)     Status: None   Collection Time: 10/18/19  6:32 AM  Result Value Ref Range   HIV Screen 4th Generation wRfx Non Reactive Non Reactive    Comment: Performed at [facility], [address] [place], [place] [address]  Basic metabolic panel     Status: Abnormal   Collection Time: 10/18/19  6:32 AM  Result Value Ref Range   Sodium 129 (L) 135 - 145 mmol/L   Potassium 4.1 3.5 - 5.1 mmol/L   Chloride 91 (L) 98 - 111 mmol/L   CO2 29 22 - 32 mmol/L   Glucose, Bld 87 70 - 99 mg/dL    Comment: Glucose reference range applies only to samples taken after fasting for at least 8 hours.   BUN 6 (L) 8 - 23 mg/dL   Creatinine, Ser 0.78 0.61 - 1.24 mg/dL   Calcium 8.7 (L) 8.9 - 10.3 mg/dL   GFR calc non Af Amer >60 >60 mL/min   GFR calc Af Amer >60 >60 mL/min   Anion gap 9 5 - 15  Comment: Performed at [facility], [address] [place], [place] [address]  CBC     Status: Abnormal   Collection Time: 10/18/19  6:32 AM  Result Value Ref Range   WBC 10.3 4.0 - 10.5 K/uL   RBC 3.23 (L) 4.22 - 5.81 MIL/uL   Hemoglobin 9.5 (L) 13.0 - 17.0 g/dL   HCT 28.8 (L) 39 - 52 %   MCV 89.2 80.0 - 100.0 fL   MCH 29.4 26.0 - 34.0  pg   MCHC 33.0 30.0 - 36.0 g/dL   RDW 13.0 11.5 - 15.5 %   Platelets 375 150 - 400 K/uL   nRBC 0.0 0.0 - 0.2 %    Comment: Performed at [facility], [address], [place], [place] [address]  Aerobic/Anaerobic Culture (surgical/deep wound)     Status: None   Collection Time: 10/18/19  1:05 PM   Specimen: Foot; Abscess  Result Value Ref Range   Specimen Description      FOOT LEFT FOOT Performed at [facility], [address] [place], [place] [address]    Special Requests      NONE Performed at [facility], [address] [place],  [address]    Gram Stain      ABUNDANT WBC PRESENT, PREDOMINANTLY PMN ABUNDANT GRAM POSITIVE COCCI ABUNDANT GRAM NEGATIVE RODS    Culture      ABUNDANT PSEUDOMONAS AERUGINOSA FEW STAPHYLOCOCCUS AUREUS FEW GROUP B STREP(S.AGALACTIAE)ISOLATED TESTING AGAINST S. AGALACTIAE NOT ROUTINELY PERFORMED DUE TO PREDICTABILITY OF AMP/PEN/VAN SUSCEPTIBILITY. NO ANAEROBES ISOLATED Performed at [facility], [place] [address] [place],  [address]    Report Status 10/21/2019 FINAL    Organism ID, Bacteria PSEUDOMONAS AERUGINOSA    Organism ID, Bacteria STAPHYLOCOCCUS AUREUS       Susceptibility   Pseudomonas aeruginosa - MIC*    CEFTAZIDIME 2 SENSITIVE Sensitive     CIPROFLOXACIN <=0.25 SENSITIVE Sensitive     GENTAMICIN <=1 SENSITIVE Sensitive     IMIPENEM 2 SENSITIVE Sensitive     PIP/TAZO 8 SENSITIVE Sensitive     CEFEPIME <=1 SENSITIVE Sensitive     * ABUNDANT PSEUDOMONAS AERUGINOSA   Staphylococcus aureus - MIC*    CIPROFLOXACIN <=0.5 SENSITIVE Sensitive     ERYTHROMYCIN >=8 RESISTANT Resistant     GENTAMICIN <=0.5 SENSITIVE Sensitive     OXACILLIN 0.5 SENSITIVE Sensitive     TETRACYCLINE <=1 SENSITIVE Sensitive     VANCOMYCIN 1 SENSITIVE Sensitive     TRIMETH/SULFA <=10 SENSITIVE Sensitive     CLINDAMYCIN <=0.25 SENSITIVE Sensitive     RIFAMPIN <=0.5 SENSITIVE Sensitive     Inducible Clindamycin  NEGATIVE Sensitive     * FEW STAPHYLOCOCCUS AUREUS  SARS Coronavirus 2 by RT PCR (hospital order, performed in [place] hospital lab) Nasopharyngeal Nasopharyngeal Swab     Status: None   Collection Time: 10/18/19  3:54 PM   Specimen: Nasopharyngeal Swab  Result Value Ref Range   SARS Coronavirus 2 NEGATIVE NEGATIVE    Comment: (NOTE) SARS-CoV-2 target nucleic acids are NOT DETECTED. The SARS-CoV-2 RNA is generally detectable in upper and lower respiratory specimens during the acute phase of infection. The lowest concentration of SARS-CoV-2 viral copies this assay can detect is 250 copies / mL. A negative result does not preclude SARS-CoV-2 infection and should not be used as the sole basis for treatment or other patient management decisions.  A negative result may occur with improper specimen collection / handling, submission of specimen  other than nasopharyngeal swab, presence of viral mutation(s) within the areas targeted by this assay, and inadequate number of viral copies (<250 copies / mL). A negative result must be combined with clinical observations, patient history, and epidemiological information. Fact Sheet for Patients:   StrictlyIdeas.no Fact Sheet for Healthcare Providers: BankingDealers.co.za This test is not yet approved or cleared  by the [place] FDA and has been authorized for detection and/or diagnosis of SARS-CoV-2 by FDA under an Emergency Use Authorization (EUA).  This EUA will remain in effect (meaning this test can be used) for the duration of the COVID-19 declaration under Section 564(b)(1) of the Act, 21 U.S.C. section 360bbb-3(b)(1), unless the authorization is terminated or revoked sooner. Performed at [facility], [address] [place], [place] [address]   Creatinine, serum     Status: None   Collection Time: 10/19/19  9:04 AM  Result Value Ref Range   Creatinine, Ser 0.74 0.61 - 1.24  mg/dL   GFR calc non Af Amer >60 >60 mL/min   GFR calc Af Amer >60 >60 mL/min    Comment: Performed at [facility], [address], [place], [place] [address]  Surgical pcr screen     Status: Abnormal   Collection Time: 10/20/19  2:18 AM   Specimen: Nasal Mucosa; Nasal Swab  Result Value Ref Range   MRSA, PCR NEGATIVE NEGATIVE   Staphylococcus aureus POSITIVE (A) NEGATIVE    Comment: (NOTE) The Xpert SA Assay (FDA approved for NASAL specimens in patients 44 years of age and older), is one component of a comprehensive surveillance program. It is not intended to diagnose infection nor to guide or monitor treatment. Performed at [facility], [address] [place], [place] [address]   Basic metabolic panel     Status: Abnormal   Collection Time: 10/20/19  4:18 AM  Result Value Ref Range   Sodium 127 (L) 135 - 145 mmol/L   Potassium 4.3 3.5 - 5.1 mmol/L   Chloride 94 (L) 98 - 111 mmol/L   CO2 27 22 - 32 mmol/L   Glucose, Bld 81 70 - 99 mg/dL    Comment: Glucose reference range applies only to samples taken after fasting for at least 8 hours.   BUN 9 8 - 23 mg/dL   Creatinine, Ser 0.75 0.61 - 1.24 mg/dL   Calcium 8.3 (L) 8.9 - 10.3 mg/dL   GFR calc non Af Amer >60 >60 mL/min   GFR calc Af Amer >60 >60 mL/min   Anion gap 6 5 - 15    Comment: Performed at [facility], [address] [place], [place] [address]  Surgical pathology     Status: None   Collection Time: 10/20/19 11:11 AM  Result Value Ref Range   SURGICAL PATHOLOGY      SURGICAL PATHOLOGY CASE: [id number] PATIENT: Tyler [name] Surgical Pathology Report     Specimen Submitted: A. Transmetatarsal, left; amputation  Clinical History: Osteomyelitis gangrene      DIAGNOSIS: A.  FOOT, LEFT; TRANSMETATARSAL AMPUTATION: - STATUS POST AMPUTATION OF FIRST, SECOND, AND THIRD DIGITS WITH SOFT TISSUE NECROSIS AND ACUTE OSTEOMYELITIS AT PREVIOUS SURGICAL SITE. - PROXIMAL BONE  MARGINS AND THREE SEPARATELY SUBMITTED FRAGMENTS OF BONE, NEGATIVE FOR ACUTE OSTEOMYELITIS. - VIABLE SOFT TISSUE AT RESECTION MARGIN.  GROSS DESCRIPTION: A. Labeled: Left transmetatarsal amputation Received: In formalin Size: 9.7 x 6.3 x 9.5 cm, 3 separately submitted pieces of bone ranging from 1.3 x 1 x 0.3 cm up to  1.5 x 1.2 x 0.7 cm Description of lesion(s): The great, second and third toes have been previously amputated leaving behind a poorly healed partially sutured closed incision that measures 6.7 x 2.5 cm in greatest dimensions.  The skin and so ft tissue surrounding the opening is discolored brown and markedly softened.  A large portion of the skin at the incision and on the dorsal surface of the foot is also discolored dark brown and irregular. Proximal margin: The skin and soft tissue at the margin resection is questionably viable. Bone: The bone at the proximal margin resection is unremarkable, as well as the 3 separately submitted pieces of bone Other findings: The specimen is stated to be inked at the proximal margin, however there is no surgical ink on the main specimen or on the separately submitted piece of bone  Block summary: 1-perpendicular sections from skin and soft tissue margin of resection (margin inked blue) 2-skin and soft tissue from previous incision site 3-bone underlying previous incision site, after decalcification 4-perpendicular sections of bone proximal margin great and second toes, after decalcification 5-perpendicular sections of bone proximal margin third through fifth toes, af ter decalcification 6-7-separately submitted bone after decalcification   Tissue decalcification: Submitted after decalcification  Final Diagnosis performed by Quay Burow, MD.   Electronically signed 10/25/2019 2:14:48PM The electronic signature indicates that the named Attending Pathologist has evaluated the specimen Technical component performed at [facility],  [address], [place], [place] [address] Lab: [phone] Dir: Rush Farmer, MD, MMM  Professional component performed at [facility], [place], [place], [place] [address] Lab: [phone] Dir: Dellia Nims. Rubinas, MD   Aerobic/Anaerobic Culture (surgical/deep wound)     Status: None   Collection Time: 10/20/19 11:39 AM   Specimen: Wound  Result Value Ref Range   Specimen Description      WOUND Performed at [facility], [address], [place], [place] [address]    Special Requests      NONE Performed at [facility], [place], [place] [address]    Gram Stain      NO WBC SEEN ABUNDANT GRAM POSITIVE COCCI IN PAIRS    Culture      ABUNDANT PSEUDOMONAS AERUGINOSA FEW STAPHYLOCOCCUS AUREUS ABUNDANT BACTEROIDES THETAIOTAOMICRON BETA LACTAMASE POSITIVE Performed at [facility], [place] [address] [place], [place] [address]    Report Status 10/23/2019 FINAL    Organism ID, Bacteria PSEUDOMONAS AERUGINOSA    Organism ID, Bacteria STAPHYLOCOCCUS AUREUS       Susceptibility   Pseudomonas aeruginosa - MIC*    CEFTAZIDIME 16 INTERMEDIATE Intermediate     CIPROFLOXACIN <=0.25 SENSITIVE Sensitive     GENTAMICIN <=1 SENSITIVE Sensitive     IMIPENEM 2 SENSITIVE Sensitive     CEFEPIME INTERMEDIATE Intermediate     * ABUNDANT PSEUDOMONAS AERUGINOSA   Staphylococcus aureus - MIC*    CIPROFLOXACIN <=0.5 SENSITIVE Sensitive     ERYTHROMYCIN <=0.25 SENSITIVE Sensitive     GENTAMICIN <=0.5 SENSITIVE Sensitive     OXACILLIN <=0.25 SENSITIVE Sensitive     TETRACYCLINE >=16 RESISTANT Resistant     VANCOMYCIN 1 SENSITIVE Sensitive     TRIMETH/SULFA <=10 SENSITIVE Sensitive     CLINDAMYCIN <=0.25 SENSITIVE Sensitive     RIFAMPIN <=0.5 SENSITIVE Sensitive     Inducible Clindamycin NEGATIVE Sensitive     * FEW STAPHYLOCOCCUS AUREUS  Basic metabolic panel     Status: Abnormal   Collection Time: 10/21/19  4:43 AM  Result  Value Ref Range   Sodium 131 (L) 135 - 145 mmol/L   Potassium 4.4 3.5 - 5.1 mmol/L   Chloride 98 98 - 111 mmol/L   CO2 27 22 - 32 mmol/L   Glucose, Bld 98 70 - 99 mg/dL    Comment: Glucose reference range applies only to samples taken after fasting for at least 8 hours.   BUN 9 8 - 23 mg/dL   Creatinine, Ser 0.72 0.61 - 1.24 mg/dL   Calcium 8.2 (L) 8.9 - 10.3 mg/dL   GFR calc non Af Amer >60 >60 mL/min   GFR calc Af Amer >60 >60 mL/min   Anion gap 6 5 - 15    Comment: Performed at [facility], [address] [place], [place] [address]  CBC     Status: Abnormal   Collection Time: 10/21/19  4:43 AM  Result Value Ref Range   WBC 10.4 4.0 - 10.5 K/uL   RBC 2.70 (L) 4.22 - 5.81 MIL/uL   Hemoglobin 7.9 (L) 13.0 - 17.0 g/dL   HCT 24.4 (L) 39 - 52 %   MCV 90.4 80.0 - 100.0 fL   MCH 29.3 26.0 - 34.0 pg   MCHC 32.4 30.0 - 36.0 g/dL   RDW 13.3 11.5 - 15.5 %   Platelets 351 150 - 400 K/uL   nRBC 0.0 0.0 - 0.2 %    Comment: Performed at [facility], [address] [place], [place] [address]  CBC     Status: Abnormal   Collection Time: 10/22/19  4:10 AM  Result Value Ref Range   WBC 10.5 4.0 - 10.5 K/uL   RBC 2.61 (L) 4.22 - 5.81 MIL/uL   Hemoglobin 7.6 (L) 13.0 - 17.0 g/dL   HCT 23.5 (L) 39 - 52 %   MCV 90.0 80.0 - 100.0 fL   MCH 29.1 26.0 - 34.0 pg   MCHC 32.3 30.0 - 36.0 g/dL   RDW 13.2 11.5 - 15.5 %   Platelets 320 150 - 400 K/uL   nRBC 0.0 0.0 - 0.2 %    Comment: Performed at [facility], [address] [place], [place] [address]  Iron and TIBC     Status: Abnormal   Collection Time: 10/22/19  4:10 AM  Result Value Ref Range   Iron 11 (L) 45 - 182 ug/dL   TIBC 199 (L) 250 - 450 ug/dL   Saturation Ratios 6 (L) 17.9 - 39.5 %   UIBC 188 ug/dL    Comment: Performed at [facility], [address] [place],  [address]  Ferritin     Status: None   Collection Time: 10/22/19  4:10 AM  Result Value Ref Range   Ferritin 220 24 -  336 ng/mL    Comment: Performed at [facility], [address] [place],  [address]  Basic metabolic panel     Status: Abnormal   Collection Time: 10/22/19  4:10 AM  Result Value Ref Range   Sodium 131 (L) 135 - 145 mmol/L   Potassium 4.4 3.5 - 5.1 mmol/L   Chloride 99 98 - 111 mmol/L   CO2 26 22 - 32 mmol/L   Glucose, Bld 90 70 - 99 mg/dL    Comment: Glucose reference range applies only to samples taken after fasting for at least 8 hours.   BUN 9 8 - 23 mg/dL   Creatinine, Ser 0.70 0.61 - 1.24 mg/dL   Calcium 8.2 (L) 8.9 -  10.3 mg/dL   GFR calc non Af Amer >60 >60 mL/min   GFR calc Af Amer >60 >60 mL/min   Anion gap 6 5 - 15    Comment: Performed at [facility], [address] [place], [place] [address]  Protime-INR     Status: Abnormal   Collection Time: 10/23/19  5:03 AM  Result Value Ref Range   Prothrombin Time 17.5 (H) 11.4 - 15.2 seconds   INR 1.5 (H) 0.8 - 1.2    Comment: (NOTE) INR goal varies based on device and disease states. Performed at [facility], [address] [place], [place] [address]   Basic metabolic panel     Status: Abnormal   Collection Time: 10/23/19  5:03 AM  Result Value Ref Range   Sodium 129 (L) 135 - 145 mmol/L   Potassium 4.2 3.5 - 5.1 mmol/L   Chloride 95 (L) 98 - 111 mmol/L   CO2 27 22 - 32 mmol/L   Glucose, Bld 104 (H) 70 - 99 mg/dL    Comment: Glucose reference range applies only to samples taken after fasting for at least 8 hours.   BUN 8 8 - 23 mg/dL   Creatinine, Ser 0.74 0.61 - 1.24 mg/dL   Calcium 8.5 (L) 8.9 - 10.3 mg/dL   GFR calc non Af Amer >60 >60 mL/min   GFR calc Af Amer >60 >60 mL/min   Anion gap 7 5 - 15    Comment: Performed at [facility], [address] [place], [place] [address]  CBC     Status: Abnormal   Collection Time: 10/23/19  5:03 AM  Result Value Ref Range   WBC 8.8 4.0 - 10.5 K/uL   RBC 2.60 (L) 4.22 - 5.81 MIL/uL   Hemoglobin 7.7 (L) 13.0 - 17.0 g/dL    HCT 22.5 (L) 39 - 52 %   MCV 86.5 80.0 - 100.0 fL   MCH 29.6 26.0 - 34.0 pg   MCHC 34.2 30.0 - 36.0 g/dL   RDW 13.4 11.5 - 15.5 %   Platelets 279 150 - 400 K/uL   nRBC 0.0 0.0 - 0.2 %    Comment: Performed at [facility], [address] [place], [place] [address]  Protime-INR     Status: Abnormal   Collection Time: 10/24/19  4:29 AM  Result Value Ref Range   Prothrombin Time 18.3 (H) 11.4 - 15.2 seconds   INR 1.6 (H) 0.8 - 1.2    Comment: (NOTE) INR goal varies based on device and disease states. Performed at [facility], [address] [place], [place] [address]   CBC     Status: Abnormal   Collection Time: 10/25/19  6:46 AM  Result Value Ref Range   WBC 9.7 4.0 - 10.5 K/uL   RBC 2.91 (L) 4.22 - 5.81 MIL/uL   Hemoglobin 8.3 (L) 13.0 - 17.0 g/dL   HCT 25.9 (L) 39 - 52 %   MCV 89.0 80.0 - 100.0 fL   MCH 28.5 26.0 - 34.0 pg   MCHC 32.0 30.0 - 36.0 g/dL   RDW 13.5 11.5 - 15.5 %   Platelets 404 (H) 150 - 400 K/uL   nRBC 0.0 0.0 - 0.2 %    Comment: Performed at [facility], [address] [place], [place] [address]  Comprehensive metabolic panel     Status: Abnormal   Collection Time: 10/25/19  6:46 AM  Result Value Ref Range   Sodium  133 (L) 135 - 145 mmol/L   Potassium 4.8 3.5 - 5.1 mmol/L    Comment: HEMOLYSIS AT THIS LEVEL MAY AFFECT RESULT   Chloride 94 (L) 98 - 111 mmol/L   CO2 27 22 - 32 mmol/L   Glucose, Bld 117 (H) 70 - 99 mg/dL    Comment: Glucose reference range applies only to samples taken after fasting for at least 8 hours.   BUN 5 (L) 8 - 23 mg/dL   Creatinine, Ser 0.75 0.61 - 1.24 mg/dL   Calcium 8.8 (L) 8.9 - 10.3 mg/dL   Total Protein 6.3 (L) 6.5 - 8.1 g/dL   Albumin 3.0 (L) 3.5 - 5.0 g/dL   AST 28 15 - 41 U/L   ALT 19 0 - 44 U/L   Alkaline Phosphatase 57 38 - 126 U/L   Total Bilirubin 0.6 0.3 - 1.2 mg/dL   GFR calc non Af Amer >60 >60 mL/min   GFR calc Af Amer >60 >60 mL/min   Anion gap 12 5 - 15    Comment:  Performed at [facility], [address] [place], [place] [address]  Brain natriuretic peptide     Status: Abnormal   Collection Time: 10/25/19  6:46 AM  Result Value Ref Range   B Natriuretic Peptide 525.4 (H) 0.0 - 100.0 pg/mL    Comment: Performed at [facility], [place], [place] [address]  Troponin I (High Sensitivity)     Status: None   Collection Time: 10/25/19  6:46 AM  Result Value Ref Range   Troponin I (High Sensitivity) 15 <18 ng/L    Comment: (NOTE) Elevated high sensitivity troponin I (hsTnI) values and significant  changes across serial measurements may suggest ACS but many other  chronic and acute conditions are known to elevate hsTnI results.  Refer to the "Links" section for chest pain algorithms and additional  guidance. Performed at [facility], [address] [place], [place] [address]   Blood gas, venous     Status: Abnormal   Collection Time: 10/25/19  7:32 AM  Result Value Ref Range   pH, Ven 7.40 7.25 - 7.43   pCO2, Ven 50 44 - 60 mmHg   pO2, Ven 38.0 32 - 45 mmHg   Bicarbonate 31.0 (H) 20.0 - 28.0 mmol/L   Acid-Base Excess 5.4 (H) 0.0 - 2.0 mmol/L   O2 Saturation 72.1 %   Patient temperature 37.0    Collection site VEIN    Sample type VENIPUNCTURE     Comment: Performed at [facility], [address] [place], [place] [address]  SARS Coronavirus 2 by RT PCR (hospital order, performed in [facility] hospital lab) Nasopharyngeal Nasopharyngeal Swab     Status: None   Collection Time: 10/25/19 10:29 AM   Specimen: Nasopharyngeal Swab  Result Value Ref Range   SARS Coronavirus 2 NEGATIVE NEGATIVE    Comment: (NOTE) SARS-CoV-2 target nucleic acids are NOT DETECTED. The SARS-CoV-2 RNA is generally detectable in upper and lower respiratory specimens during the acute phase of infection. The lowest concentration of SARS-CoV-2 viral copies this assay can detect is 250 copies / mL. A negative result  does not preclude SARS-CoV-2 infection and should not be used as the sole basis for treatment or other patient management decisions.  A negative result may occur with improper specimen collection / handling, submission of specimen other than nasopharyngeal swab, presence of viral mutation(s) within the areas targeted by this assay, and inadequate  number of viral copies (<250 copies / mL). A negative result must be combined with clinical observations, patient history, and epidemiological information. Fact Sheet for Patients:   StrictlyIdeas.no Fact Sheet for Healthcare Providers: BankingDealers.co.za This test is not yet approved or cleared  by the [place] FDA and has been authorized for detection and/or diagnosis of SARS-CoV-2 by FDA under an Emergency Use Authorization (EUA).  This EUA will remain in effect (meaning this test can be used) for the duration of the COVID-19 declaration under Section 564(b)(1) of the Act, 21 U.S.C. section 360bbb-3(b)(1), unless the authorization is terminated or revoked sooner. Performed at [facility], [address] [place], [place] [address]   Protime-INR     Status: Abnormal   Collection Time: 10/25/19  2:50 PM  Result Value Ref Range   Prothrombin Time 20.1 (H) 11.4 - 15.2 seconds   INR 1.8 (H) 0.8 - 1.2    Comment: (NOTE) INR goal varies based on device and disease states. Performed at [facility], [address] [place], [place] [address]   Culture, sputum-assessment     Status: None   Collection Time: 10/25/19  4:28 PM   Specimen: Expectorated Sputum  Result Value Ref Range   Specimen Description EXPECTORATED SPUTUM    Special Requests NONE    Sputum evaluation      Sputum specimen not acceptable for testing.  Please recollect.   RESULT CALLED TO, READ BACK BY AND VERIFIED WITH: JESSICA FULCHER 10/25/19 1654 KLW Performed at [facility], [address] [place], [place] [address]    Report Status 10/25/2019 FINAL   ECHOCARDIOGRAM COMPLETE     Status: None   Collection Time: 10/25/19  7:01 PM  Result Value Ref Range   Weight 2,608 oz   Height 73 in   BP 132/73 mmHg  Basic metabolic panel     Status: Abnormal   Collection Time: 10/26/19  1:43 AM  Result Value Ref Range   Sodium 132 (L) 135 - 145 mmol/L   Potassium 4.0 3.5 - 5.1 mmol/L   Chloride 90 (L) 98 - 111 mmol/L   CO2 30 22 - 32 mmol/L   Glucose, Bld 131 (H) 70 - 99 mg/dL    Comment: Glucose reference range applies only to samples taken after fasting for at least 8 hours.   BUN 8 8 - 23 mg/dL   Creatinine, Ser 0.71 0.61 - 1.24 mg/dL   Calcium 8.9 8.9 - 10.3 mg/dL   GFR calc non Af Amer >60 >60 mL/min   GFR calc Af Amer >60 >60 mL/min   Anion gap 12 5 - 15    Comment: Performed at [facility], [address] [place], [place] [address]  Protime-INR     Status: Abnormal   Collection Time: 10/26/19  1:43 AM  Result Value Ref Range   Prothrombin Time 20.9 (H) 11.4 - 15.2 seconds   INR 1.9 (H) 0.8 - 1.2    Comment: (NOTE) INR goal varies based on device and disease states. Performed at [facility], [address] [place], [place] [address]   Fibrin derivatives [facility] only)     Status: Abnormal   Collection Time: 10/26/19  1:43 AM  Result Value Ref Range   Fibrin derivatives D-dimer ([facility]) 565.31 (H) 0.00 - 499.00 ng/mL (FEU)    Comment: (NOTE) <> Exclusion of Venous Thromboembolism (VTE) - OUTPATIENT ONLY   (Emergency Department or [place])   0-499 ng/ml (FEU): With  a low to intermediate pretest probability                      for VTE this test result excludes the diagnosis                      of VTE.   >499 ng/ml (FEU) : VTE not excluded; additional work up for VTE is                      required. <> Testing on Inpatients and Evaluation of Disseminated Intravascular   Coagulation (DIC) Reference Range:   0-499 ng/ml (FEU) Performed  at [facility], [address] [place], [place] [address]   Magnesium     Status: None   Collection Time: 10/26/19  1:43 AM  Result Value Ref Range   Magnesium 1.7 1.7 - 2.4 mg/dL    Comment: Performed at [facility], [address] [place], [place] [address]  Procalcitonin - Baseline     Status: None   Collection Time: 10/26/19 11:45 AM  Result Value Ref Range   Procalcitonin <0.10 ng/mL    Comment:        Interpretation: PCT (Procalcitonin) <= 0.5 ng/mL: Systemic infection (sepsis) is not likely. Local bacterial infection is possible. (NOTE)       Sepsis PCT Algorithm           Lower Respiratory Tract                                      Infection PCT Algorithm    ----------------------------     ----------------------------         PCT < 0.25 ng/mL                PCT < 0.10 ng/mL         Strongly encourage             Strongly discourage   discontinuation of antibiotics    initiation of antibiotics    ----------------------------     -----------------------------       PCT 0.25 - 0.50 ng/mL            PCT 0.10 - 0.25 ng/mL               OR       >80% decrease in PCT            Discourage initiation of                                            antibiotics      Encourage discontinuation           of antibiotics    ----------------------------     -----------------------------         PCT >= 0.50 ng/mL              PCT 0.26 - 0.50 ng/mL               AND        <80% decrease in PCT             Encourage initiation of  antibiotics       Encourage continuation           of antibiotics    ----------------------------     -----------------------------        PCT >= 0.50 ng/mL                  PCT > 0.50 ng/mL               AND         increase in PCT                  Strongly encourage                                      initiation of antibiotics    Strongly encourage escalation           of antibiotics                                      -----------------------------                                           PCT <= 0.25 ng/mL                                                 OR                                        > 80% decrease in PCT                                     Discontinue / Do not initiate                                             antibiotics Performed at [facility], [address] [place], [place] [address]   Basic metabolic panel     Status: Abnormal   Collection Time: 10/27/19  5:41 AM  Result Value Ref Range   Sodium 131 (L) 135 - 145 mmol/L   Potassium 3.5 3.5 - 5.1 mmol/L   Chloride 90 (L) 98 - 111 mmol/L   CO2 31 22 - 32 mmol/L   Glucose, Bld 99 70 - 99 mg/dL    Comment: Glucose reference range applies only to samples taken after fasting for at least 8 hours.   BUN 12 8 - 23 mg/dL   Creatinine, Ser 0.74 0.61 - 1.24 mg/dL   Calcium 8.7 (L) 8.9 - 10.3 mg/dL   GFR calc non Af Amer >60 >60 mL/min   GFR calc Af Amer >60 >60 mL/min   Anion gap 10 5 - 15    Comment: Performed at [facility], [address] [place], [place] [address]  Protime-INR  Status: Abnormal   Collection Time: 10/27/19  5:41 AM  Result Value Ref Range   Prothrombin Time 29.2 (H) 11.4 - 15.2 seconds   INR 2.9 (H) 0.8 - 1.2    Comment: (NOTE) INR goal varies based on device and disease states. Performed at [facility], [address] [place], [place] [address]   Basic metabolic panel     Status: Abnormal   Collection Time: 10/28/19  6:19 AM  Result Value Ref Range   Sodium 131 (L) 135 - 145 mmol/L   Potassium 3.6 3.5 - 5.1 mmol/L   Chloride 89 (L) 98 - 111 mmol/L   CO2 32 22 - 32 mmol/L   Glucose, Bld 86 70 - 99 mg/dL    Comment: Glucose reference range applies only to samples taken after fasting for at least 8 hours.   BUN 11 8 - 23 mg/dL   Creatinine, Ser 0.74 0.61 - 1.24 mg/dL   Calcium 8.8 (L) 8.9 - 10.3 mg/dL   GFR calc non Af Amer >60 >60 mL/min    GFR calc Af Amer >60 >60 mL/min   Anion gap 10 5 - 15    Comment: Performed at [facility], [address] [place], [place] [address]  Protime-INR     Status: Abnormal   Collection Time: 10/28/19  6:19 AM  Result Value Ref Range   Prothrombin Time 22.8 (H) 11.4 - 15.2 seconds   INR 2.1 (H) 0.8 - 1.2    Comment: (NOTE) INR goal varies based on device and disease states. Performed at [facility], [address] [place], [place] [address]   CBC     Status: Abnormal   Collection Time: 10/28/19  6:19 AM  Result Value Ref Range   WBC 10.8 (H) 4.0 - 10.5 K/uL   RBC 3.15 (L) 4.22 - 5.81 MIL/uL   Hemoglobin 9.0 (L) 13.0 - 17.0 g/dL   HCT 27.0 (L) 39 - 52 %   MCV 85.7 80.0 - 100.0 fL   MCH 28.6 26.0 - 34.0 pg   MCHC 33.3 30.0 - 36.0 g/dL   RDW 13.3 11.5 - 15.5 %   Platelets 429 (H) 150 - 400 K/uL   nRBC 0.0 0.0 - 0.2 %    Comment: Performed at [facility], [address], [place], [place] [address]  Basic metabolic panel     Status: Abnormal   Collection Time: 10/29/19  7:08 AM  Result Value Ref Range   Sodium 130 (L) 135 - 145 mmol/L   Potassium 3.3 (L) 3.5 - 5.1 mmol/L   Chloride 88 (L) 98 - 111 mmol/L   CO2 30 22 - 32 mmol/L   Glucose, Bld 107 (H) 70 - 99 mg/dL    Comment: Glucose reference range applies only to samples taken after fasting for at least 8 hours.   BUN 10 8 - 23 mg/dL   Creatinine, Ser 0.75 0.61 - 1.24 mg/dL   Calcium 8.8 (L) 8.9 - 10.3 mg/dL   GFR calc non Af Amer >60 >60 mL/min   GFR calc Af Amer >60 >60 mL/min   Anion gap 12 5 - 15    Comment: Performed at [facility], [address] [place], [place] [address]  Protime-INR     Status: Abnormal   Collection Time: 10/29/19  7:08 AM  Result Value Ref Range   Prothrombin Time 22.5 (H) 11.4 - 15.2 seconds   INR 2.1 (H) 0.8 - 1.2  Comment: (NOTE) INR goal varies based on device and disease states. Performed at [facility], [address],  [place], [place] [address]     Radiology CT Chest Wo Contrast  Result Date: 11/04/2019 CLINICAL DATA:  Abnormal x-ray, lung nodule seen on previous imaging. EXAM: CT CHEST WITHOUT CONTRAST TECHNIQUE: Multidetector CT imaging of the chest was performed following the standard protocol without IV contrast. COMPARISON:  10/26/2019 FINDINGS: Cardiovascular: Calcified atheromatous plaque of the thoracic aorta. No sign of aneurysm. Three-vessel coronary artery disease. Heart size mildly enlarged. Signs of pacer defibrillator, dual lead with power pack over the LEFT chest as before. Limited assessment of heart and great vessels given lack of intravenous contrast. Small pericardial effusion similar to previous study from June of 2021. Central pulmonary vasculature is of normal caliber. Mediastinum/Nodes: Calcification of LEFT hemi thyroid is dense and unchanged. No axillary lymphadenopathy. No mediastinal lymphadenopathy. Mildly patulous esophagus. No hilar adenopathy. Lungs/Pleura: Signs of pulmonary emphysema both paraseptal and centrilobular with area of nodularity and linear opacity in the RIGHT upper lobe that has been present since at 2015 and reportedly earlier without change. Small nodule along the posterior margin of the RIGHT upper chest 4 mm new since 2019. (Image 56, series 3) airways are patent. No additional nodules. Mild bronchial wall thickening. Secretions noted in the RIGHT lower lobe bronchus. Upper Abdomen: Splenic contours are lobular similar to prior study. The adrenals are unremarkable. Musculoskeletal: No acute musculoskeletal process. Spinal degenerative changes. IMPRESSION: 1. Signs of pulmonary emphysema both paraseptal and centrilobular with area of nodularity and linear opacity in the RIGHT upper lobe that has been present since at 2015 and reportedly earlier without change. 2. Small 4 mm nodule along the posterior margin of the RIGHT upper chest new since 2019 unchanged since June of 2020 and  stable for 1 years time, likely benign. 3. Material and or secretions in RIGHT lower lobe bronchus. And RIGHT mainstem bronchus, correlate with any risk for aspiration. 4. Small pericardial effusion similar to previous study from June of 2021. 5. Three-vessel coronary artery disease. 6. Emphysema and aortic atherosclerosis. Aortic Atherosclerosis (ICD10-I70.0) and Emphysema (ICD10-J43.9). Electronically Signed   By: Zetta Bills M.D.   On: 11/04/2019 20:07   CT ANGIO CHEST PE W OR WO CONTRAST  Result Date: 10/26/2019 CLINICAL DATA:  Show of breath EXAM: CT ANGIOGRAPHY CHEST WITH CONTRAST TECHNIQUE: Multidetector CT imaging of the chest was performed using the standard protocol during bolus administration of intravenous contrast. Multiplanar CT image reconstructions and MIPs were obtained to evaluate the vascular anatomy. CONTRAST:  42mL OMNIPAQUE IOHEXOL 350 MG/ML SOLN COMPARISON:  Chest CT May 05, 2019; chest radiograph October 25, 2019 FINDINGS: Cardiovascular: There is no demonstrable pulmonary embolus. There is no appreciable thoracic aortic aneurysm or dissection. There are scattered foci of calcification in visualized great vessels. There are foci of atherosclerotic calcification in the aorta. There are multiple foci of coronary artery calcification. No pericardial effusion or pericardial thickening is evident. There is a pacemaker present with lead tips attached to the right ventricle and coronary sinus. Mediastinum/Nodes: Thyroid appears normal. There is a lymph node to the left of the distal trachea measuring 1.2 x 1.0 cm, unchanged from previous study. Scattered subcentimeter lymph nodes elsewhere noted. No esophageal lesions are evident. Lungs/Pleura: There is centrilobular emphysematous change, stable. Scarring in the anterior right upper lobe near the apex is stable. There are pleural effusions bilaterally, larger on the right than on the left, with bibasilar compressive atelectasis. There may  be  superimposed pneumonia in these areas of atelectasis. Upper Abdomen: In the visualized upper abdomen, there is aortic atherosclerosis. There are calcified splenic granulomas. Visualized upper abdominal structures otherwise appear unremarkable. Musculoskeletal: There is stable anterior wedging of the T10 vertebral body. No blastic or lytic bone lesions. No evident chest wall lesions. Pacemaker device on the left anteriorly. Review of the MIP images confirms the above findings. IMPRESSION: 1. No demonstrable pulmonary embolus. No thoracic aortic aneurysm or dissection. There is aortic atherosclerosis as well as foci of great vessel and coronary artery calcification. Pacemaker leads attached to right ventricle and coronary sinus. 2. Underlying centrilobular emphysematous change. Pleural effusions bilaterally, larger on the right than on the left, with compressive atelectasis in both lung bases. A degree of superimposed pneumonia in these areas of atelectasis cannot be entirely excluded. 3. Mildly prominent left paratracheal lymph node, stable. No new lymph node prominence. 4.  Calcified splenic granulomas. Aortic Atherosclerosis (ICD10-I70.0) and Emphysema (ICD10-J43.9). Electronically Signed   By: Lowella Grip III M.D.   On: 10/26/2019 09:09   US Venous Img Lower Unilateral Left (DVT)  Result Date: 10/26/2019 CLINICAL DATA:  Left lower extremity edema EXAM: Left LOWER EXTREMITY VENOUS DOPPLER ULTRASOUND TECHNIQUE: Gray-scale sonography with graded compression, as well as color Doppler and duplex ultrasound were performed to evaluate the lower extremity deep venous systems from the level of the common femoral vein and including the common femoral, femoral, profunda femoral, popliteal and calf veins including the posterior tibial, peroneal and gastrocnemius veins when visible. The superficial great saphenous vein was also interrogated. Spectral Doppler was utilized to evaluate flow at rest and with distal  augmentation maneuvers in the common femoral, femoral and popliteal veins. COMPARISON:  None. FINDINGS: Contralateral Common Femoral Vein: Respiratory phasicity is normal and symmetric with the symptomatic side. No evidence of thrombus. Normal compressibility. Common Femoral Vein: No evidence of thrombus. Normal compressibility, respiratory phasicity and response to augmentation. Saphenofemoral Junction: No evidence of thrombus. Normal compressibility and flow on color Doppler imaging. Profunda Femoral Vein: No evidence of thrombus. Normal compressibility and flow on color Doppler imaging. Femoral Vein: No evidence of thrombus. Normal compressibility, respiratory phasicity and response to augmentation. Popliteal Vein: No evidence of thrombus. Normal compressibility, respiratory phasicity and response to augmentation. Calf Veins: No evidence of thrombus. Normal compressibility and flow on color Doppler imaging. Superficial Great Saphenous Vein: No evidence of thrombus. Normal compressibility. Venous Reflux:  None. Other Findings:  None. IMPRESSION: No evidence of deep venous thrombosis. Electronically Signed   By: Prudencio Pair M.D.   On: 10/26/2019 04:17   DG Chest Portable 1 View  Result Date: 10/25/2019 CLINICAL DATA:  Shortness of breath EXAM: PORTABLE CHEST 1 VIEW COMPARISON:  10/21/2019 FINDINGS: Cardiomegaly with biventricular ICD/pacer. Mild interstitial coarsening with a few Kerley lines and trace right pleural effusion. Emphysema. No consolidation or pneumothorax. IMPRESSION: Mild interstitial opacity which could be related to patient's emphysema or early pulmonary edema. Electronically Signed   By: Monte Fantasia M.D.   On: 10/25/2019 07:50   DG Chest Port 1 View  Result Date: 10/21/2019 CLINICAL DATA:  Increased shortness of breath. EXAM: PORTABLE CHEST 1 VIEW COMPARISON:  Oct 17, 2019 FINDINGS: Stable pacemaker. Stable AICD device. The hila and mediastinum are unremarkable. No pneumothorax. No  nodules or masses. There is a small right pleural effusion. Mild interstitial prominence centrally suggest pulmonary venous congestion. No overt edema. No focal infiltrate. No nodule or mass. IMPRESSION: 1. Findings are most consistent with cardiomegaly, pulmonary  venous congestion, and a small right pleural effusion. Electronically Signed   By: Dorise Bullion III M.D   On: 10/21/2019 13:36   ECHOCARDIOGRAM COMPLETE  Result Date: 10/26/2019    ECHOCARDIOGRAM REPORT   Patient Name:   Tyler [name] Date of Exam: 10/25/2019 Medical Rec #:  [id number]         Height:       73.0 in Accession #:    [id number]        Weight:       163.0 lb Date of Birth:  01-03-1956          BSA:          1.972 m Patient Age:    52 years          BP:           132/65 mmHg Patient Gender: M                 HR:           78 bpm. Exam Location:  [facility] Procedure: 2D Echo, Cardiac Doppler and Color Doppler Indications:     E93.81 Acute Diastolic CHF  History:         Patient has no prior history of Echocardiogram examinations.                  Defibrillator. Myocardial infarction. Peripheral vascular                  disease. Lung Emphysema. Dyspnea. Atrial Fibrillation. COPD.                  Coronary artery disease.  Sonographer:     Wilford Sports Rodgers-Jones Referring Phys:  [id number] NIU Diagnosing Phys: Bartholome Bill MD IMPRESSIONS  1. Left ventricular ejection fraction, by estimation, is 55 to 60%. Left ventricular ejection fraction by PLAX is 62 %. The left ventricle has normal function. The left ventricle demonstrates regional wall motion abnormalities (see scoring diagram/findings for description). Left ventricular diastolic parameters were normal.  2. Right ventricular systolic function is normal. The right ventricular size is normal.  3. Left atrial size was moderately dilated.  4. The mitral valve was not well visualized. Mild to moderate mitral valve regurgitation.  5. The aortic valve was not well visualized. Aortic valve  regurgitation is trivial. FINDINGS  Left Ventricle: Left ventricular ejection fraction, by estimation, is 55 to 60%. Left ventricular ejection fraction by PLAX is 62 %. The left ventricle has normal function. The left ventricle demonstrates regional wall motion abnormalities. The left ventricular internal cavity size was normal in size. There is no left ventricular hypertrophy. Left ventricular diastolic parameters were normal.  LV Wall Scoring: The mid anteroseptal segment is hypokinetic. Right Ventricle: The right ventricular size is normal. No increase in right ventricular wall thickness. Right ventricular systolic function is normal. Left Atrium: Left atrial size was moderately dilated. Right Atrium: Right atrial size was normal in size. Pericardium: There is no evidence of pericardial effusion. Mitral Valve: The mitral valve was not well visualized. Mild to moderate mitral valve regurgitation. Tricuspid Valve: The tricuspid valve is grossly normal. Tricuspid valve regurgitation is mild. Aortic Valve: The aortic valve was not well visualized. Aortic valve regurgitation is trivial. Pulmonic Valve: The pulmonic valve was not well visualized. Pulmonic valve regurgitation is not visualized. Aorta: The aortic root was not well visualized. IAS/Shunts: The interatrial septum was not assessed. Additional Comments: A pacer wire is visualized.  LEFT VENTRICLE  PLAX 2D LV EF:         Left            Diastology                ventricular     LV e' lateral:   9.36 cm/s                ejection        LV E/e' lateral: 14.3                fraction by     LV e' medial:    11.70 cm/s                PLAX is 62      LV E/e' medial:  11.5                %. LVIDd:         7.26 cm LVIDs:         4.78 cm LV PW:         1.08 cm LV IVS:        0.74 cm  RIGHT VENTRICLE             IVC RV Basal diam:  4.04 cm     IVC diam: 2.27 cm RV S prime:     13.40 cm/s TAPSE (M-mode): 1.8 cm LEFT ATRIUM              Index       RIGHT ATRIUM            Index LA diam:        6.00 cm  3.04 cm/m  RA Area:     19.70 cm LA Vol (A2C):   108.0 ml 54.75 ml/m RA Volume:   57.20 ml  29.00 ml/m LA Vol (A4C):   93.0 ml  47.15 ml/m LA Biplane Vol: 105.0 ml 53.23 ml/m   AORTA Ao Root diam: 3.60 cm MV E velocity: 134.00 cm/s MV A velocity: 39.60 cm/s MV E/A ratio:  3.38 Bartholome Bill MD Electronically signed by Bartholome Bill MD Signature Date/Time: 10/26/2019/12:33:28 PM    Final     Assessment/Plan Hx of AKA (above knee amputation), right (HCC) Well healed  Essential hypertension, benign blood pressure control important in reducing the progression of atherosclerotic disease. On appropriate oral medications.  Chronic systolic CHF (congestive heart failure) (HCC) His most recent echocardiogram showed a reduction in his ejection fraction down to about 30%. Hehe went for what was supposed to be a dual chamber pacemaker a few weeks ago but was found to already have that. They did do some "tweaking" of the pacer and this may improve his function somewhat.  COPD (chronic obstructive pulmonary disease) (HCC) Severe, requiring oxygen  Atherosclerosis of native arteries of the extremities with ulceration ([place]) His left forefoot is now become red and a little more necrotic.  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  We had a long discussion today and I think this is really the only reasonable option going forward.  I will give him some antibiotics to try to quell the infection until we do his amputation.  He and his wife voiced their understanding and are agreeable to proceed with left below-knee amputation.    Leotis Pain, MD  11/17/2019 9:45 AM    This note was created with Dragon medical transcription system.  Any errors from dictation  are purely unintentional

## 2019-11-19 ENCOUNTER — Other Ambulatory Visit (INDEPENDENT_AMBULATORY_CARE_PROVIDER_SITE_OTHER): Payer: Self-pay | Admitting: Nurse Practitioner

## 2019-11-20 ENCOUNTER — Encounter
Admission: RE | Admit: 2019-11-20 | Discharge: 2019-11-20 | Disposition: A | Discharge status: Home / Self Care | Payer: Medicare HMO | Source: Ambulatory Visit | Attending: Vascular Surgery | Admitting: Vascular Surgery

## 2019-11-20 ENCOUNTER — Other Ambulatory Visit: Payer: Self-pay

## 2019-11-20 DIAGNOSIS — I2510 Atherosclerotic heart disease of native coronary artery without angina pectoris: Secondary | ICD-10-CM | POA: Diagnosis present

## 2019-11-20 DIAGNOSIS — D6851 Activated protein C resistance: Secondary | ICD-10-CM | POA: Diagnosis present

## 2019-11-20 DIAGNOSIS — Z7951 Long term (current) use of inhaled steroids: Secondary | ICD-10-CM | POA: Diagnosis not present

## 2019-11-20 DIAGNOSIS — K219 Gastro-esophageal reflux disease without esophagitis: Secondary | ICD-10-CM | POA: Diagnosis present

## 2019-11-20 DIAGNOSIS — I429 Cardiomyopathy, unspecified: Secondary | ICD-10-CM | POA: Diagnosis present

## 2019-11-20 DIAGNOSIS — F329 Major depressive disorder, single episode, unspecified: Secondary | ICD-10-CM | POA: Diagnosis present

## 2019-11-20 DIAGNOSIS — Z7982 Long term (current) use of aspirin: Secondary | ICD-10-CM | POA: Diagnosis not present

## 2019-11-20 DIAGNOSIS — E785 Hyperlipidemia, unspecified: Secondary | ICD-10-CM | POA: Diagnosis present

## 2019-11-20 DIAGNOSIS — M5000 Cervical disc disorder with myelopathy, unspecified cervical region: Secondary | ICD-10-CM | POA: Diagnosis not present

## 2019-11-20 DIAGNOSIS — I96 Gangrene, not elsewhere classified: Secondary | ICD-10-CM | POA: Diagnosis not present

## 2019-11-20 DIAGNOSIS — Z79899 Other long term (current) drug therapy: Secondary | ICD-10-CM | POA: Diagnosis not present

## 2019-11-20 DIAGNOSIS — R0602 Shortness of breath: Secondary | ICD-10-CM | POA: Diagnosis not present

## 2019-11-20 DIAGNOSIS — D62 Acute posthemorrhagic anemia: Secondary | ICD-10-CM | POA: Diagnosis not present

## 2019-11-20 DIAGNOSIS — Z20822 Contact with and (suspected) exposure to covid-19: Secondary | ICD-10-CM | POA: Diagnosis present

## 2019-11-20 DIAGNOSIS — I110 Hypertensive heart disease with heart failure: Secondary | ICD-10-CM | POA: Diagnosis present

## 2019-11-20 DIAGNOSIS — I252 Old myocardial infarction: Secondary | ICD-10-CM | POA: Diagnosis not present

## 2019-11-20 DIAGNOSIS — I70262 Atherosclerosis of native arteries of extremities with gangrene, left leg: Secondary | ICD-10-CM | POA: Diagnosis present

## 2019-11-20 DIAGNOSIS — J441 Chronic obstructive pulmonary disease with (acute) exacerbation: Secondary | ICD-10-CM | POA: Diagnosis not present

## 2019-11-20 DIAGNOSIS — I5023 Acute on chronic systolic (congestive) heart failure: Secondary | ICD-10-CM | POA: Diagnosis not present

## 2019-11-20 DIAGNOSIS — Z01818 Encounter for other preprocedural examination: Secondary | ICD-10-CM | POA: Insufficient documentation

## 2019-11-20 DIAGNOSIS — Z95810 Presence of automatic (implantable) cardiac defibrillator: Secondary | ICD-10-CM | POA: Diagnosis not present

## 2019-11-20 DIAGNOSIS — J9811 Atelectasis: Secondary | ICD-10-CM | POA: Diagnosis not present

## 2019-11-20 DIAGNOSIS — Z9981 Dependence on supplemental oxygen: Secondary | ICD-10-CM | POA: Diagnosis not present

## 2019-11-20 DIAGNOSIS — Z89611 Acquired absence of right leg above knee: Secondary | ICD-10-CM | POA: Diagnosis not present

## 2019-11-20 DIAGNOSIS — R0902 Hypoxemia: Secondary | ICD-10-CM | POA: Diagnosis not present

## 2019-11-20 DIAGNOSIS — Z87891 Personal history of nicotine dependence: Secondary | ICD-10-CM | POA: Diagnosis not present

## 2019-11-20 DIAGNOSIS — F419 Anxiety disorder, unspecified: Secondary | ICD-10-CM | POA: Diagnosis present

## 2019-11-20 DIAGNOSIS — E1152 Type 2 diabetes mellitus with diabetic peripheral angiopathy with gangrene: Secondary | ICD-10-CM | POA: Diagnosis not present

## 2019-11-20 DIAGNOSIS — E782 Mixed hyperlipidemia: Secondary | ICD-10-CM | POA: Diagnosis not present

## 2019-11-20 DIAGNOSIS — Z7901 Long term (current) use of anticoagulants: Secondary | ICD-10-CM | POA: Diagnosis not present

## 2019-11-20 DIAGNOSIS — I4891 Unspecified atrial fibrillation: Secondary | ICD-10-CM | POA: Diagnosis present

## 2019-11-20 DIAGNOSIS — I509 Heart failure, unspecified: Secondary | ICD-10-CM | POA: Diagnosis present

## 2019-11-20 DIAGNOSIS — Z955 Presence of coronary angioplasty implant and graft: Secondary | ICD-10-CM | POA: Diagnosis not present

## 2019-11-20 DIAGNOSIS — J439 Emphysema, unspecified: Secondary | ICD-10-CM | POA: Diagnosis not present

## 2019-11-20 HISTORY — DX: Activated protein C resistance: D68.51

## 2019-11-20 HISTORY — DX: Sleep apnea, unspecified: G47.30

## 2019-11-20 NOTE — Pre-Procedure Instructions (Signed)
Spoke with patient via phone today to review his history prior to the upcoming surgery.  Patient is very well oriented to his medical condition, his medication (names and uses for each) and seemed to be in good spirits. Plan is for him to stay overnight for a couple nights.

## 2019-11-20 NOTE — Telephone Encounter (Signed)
Yes. Thanks Margie

## 2019-11-20 NOTE — Patient Instructions (Addendum)
INSTRUCTIONS FOR SURGERY     Your surgery is scheduled for:   Wednesday, June 30TH     To find out your arrival time for the day of surgery,          please call [phone] between 1 pm and 3 pm on :  Tuesday, June 29TH     When you arrive for surgery, report to the [facility].       Do NOT stop on the first floor to register.    REMEMBER: Instructions that are not followed completely may result in serious medical risk,  up to and including death, or upon the discretion of your surgeon and anesthesiologist,            your surgery may need to be rescheduled.  __X__ 1. Do not eat food after midnight the night before your procedure.                    No gum, candy, lozenger, tic tacs, tums or hard candies.                  ABSOLUTELY NOTHING SOLID IN YOUR MOUTH AFTER MIDNIGHT                    You may drink unlimited clear liquids up to 2 hours before you are scheduled to arrive for surgery.                   Do not drink anything within those 2 hours unless you need to take medicine, then take the                   smallest amount you need.  Clear liquids include:  water, apple juice without pulp,                   any flavor Gatorade, Black coffee, black tea.  Sugar may be added but no dairy/ honey /lemon.                        Broth and jello is not considered a clear liquid.  __x__  2. On the morning of surgery, please brush your teeth with toothpaste and water. You may rinse with                  mouthwash if you wish but DO NOT SWALLOW TOOTHPASTE OR MOUTHWASH  __X___3. NO alcohol for 24 hours before or after surgery.  __x___ 4.  Do NOT smoke or use e-cigarettes for 24 HOURS PRIOR TO SURGERY.                      DO NOT Use any chewable tobacco products for at least 6 hours prior to surgery.  __x___ 5. If you start any new medication after this appointment and prior to surgery, please                    Bring it with you on the day of surgery.  ___x__ 6. Notify your doctor if there is any change in  your medical condition, such as fever, infection, vomitting,                   Diarrhea or any open sores.  __x___ 7.  USE the CHG WIPES as instructed, the night before surgery and the day of surgery.                   Once you have washed with this soap, do NOT use any of the following: Powders, perfumes                    or lotions. Please do not wear make up, hairpins, clips or nail polish. You may wear deodorant.                   Men may shave their face and neck.  Women need to shave 48 hours prior to surgery.                   DO NOT wear ANY jewelry on the day of surgery. If there are rings that are too tight to                    remove easily, please address this prior to the surgery day. Piercings need to be removed.                                                                     NO METAL ON YOUR BODY.                    Do NOT bring any valuables.  If you came to Pre-Admit testing then you will not need license,                     insurance card or credit card.  If you will be staying overnight, please either leave your things in                     the car or have your family be responsible for these items.                     [facility] IS NOT RESPONSIBLE FOR BELONGINGS OR VALUABLES.  ___X__ 8. DO NOT wear contact lenses on surgery day.  You may not have dentures,                     Hearing aides, contacts or glasses in the operating room. These items can be                    Placed in the Recovery Room to receive immediately after surgery.  __x___ 9. IF YOU ARE SCHEDULED TO GO HOME ON THE SAME DAY, YOU MUST                   Have someone to drive you home and to stay with you  for the first 24 hours.                    Have an arrangement prior to arriving on surgery day.  ___x__ 10. Take the following medications on  the morning of surgery with a sip of water:                               1. PROTONIX (night before and again morning of surgery)                     2. XANAX                     3. AUGMENTIN                     4. METOPROLOL                     5. INHALERS/NEBULIZER                     6. GABAPENTIN                     7. NASAL SPRAYS  _____ 11.  Follow any instructions provided to you by your surgeon.                        Such as enema, clear liquid bowel prep  __X__  12. STOP COUMADIN AS OF:11/19/2019.                       CONTINUE TAKING ASPIRIN IN THE EVENING AS USUAL.                      STOP BC POWDERS / GOODIES POWDER                   YOU MAY TAKE TYLENOL ANY TIME PRIOR TO SURGERY.  __X___ 14.  You may continue taking Vitamin B12  but do not take on the morning of surgery.  ___X___17.  Continue to take the following medications but do not take on the morning of surgery:                            LASIX // ENTRESTO // ASPIRIN // COLACE  __X____18.  Wear clean and comfortable clothing to the hospital.  PLEASE REMEMBER TO BRING YOUR PACEMAKER CARD TO THE COVID TEST SITE.  IF YOU BRING A CELL PHONE, REMEMBER TO [facility].

## 2019-11-21 ENCOUNTER — Other Ambulatory Visit
Admission: RE | Admit: 2019-11-21 | Discharge: 2019-11-21 | Disposition: A | Discharge status: Home / Self Care | Payer: Medicare HMO | Source: Ambulatory Visit | Attending: Vascular Surgery | Admitting: Vascular Surgery

## 2019-11-21 DIAGNOSIS — Z20822 Contact with and (suspected) exposure to covid-19: Secondary | ICD-10-CM | POA: Insufficient documentation

## 2019-11-21 DIAGNOSIS — Z01812 Encounter for preprocedural laboratory examination: Secondary | ICD-10-CM | POA: Insufficient documentation

## 2019-11-21 LAB — APTT: aPTT: 36 seconds (ref 24–36)

## 2019-11-21 LAB — BASIC METABOLIC PANEL
Anion gap: 11 (ref 5–15)
BUN: 9 mg/dL (ref 8–23)
CO2: 28 mmol/L (ref 22–32)
Calcium: 9.1 mg/dL (ref 8.9–10.3)
Chloride: 92 mmol/L — ABNORMAL LOW (ref 98–111)
Creatinine, Ser: 0.84 mg/dL (ref 0.61–1.24)
GFR calc Af Amer: >60 mL/min (ref >60)
GFR calc non Af Amer: >60 mL/min (ref >60)
Glucose, Bld: 99 mg/dL (ref 70–99)
Potassium: 4.2 mmol/L (ref 3.5–5.1)
Sodium: 131 mmol/L — ABNORMAL LOW (ref 135–145)

## 2019-11-21 LAB — PROTIME-INR
INR: 1.7 — ABNORMAL HIGH (ref 0.8–1.2)
Prothrombin Time: 19.3 seconds — ABNORMAL HIGH (ref 11.4–15.2)

## 2019-11-21 LAB — TYPE AND SCREEN
ABO/RH(D): A POS
Antibody Screen: NEGATIVE

## 2019-11-21 LAB — CBC WITH DIFFERENTIAL/PLATELET
Abs Immature Granulocytes: 0.02 10*3/uL (ref 0.00–0.07)
Basophils Absolute: 0.1 10*3/uL (ref 0.0–0.1)
Basophils Relative: 1 %
Eosinophils Absolute: 0.3 10*3/uL (ref 0.0–0.5)
Eosinophils Relative: 4 %
HCT: 33.6 % — ABNORMAL LOW (ref 39.0–52.0)
Hemoglobin: 10.8 g/dL — ABNORMAL LOW (ref 13.0–17.0)
Immature Granulocytes: 0 %
Lymphocytes Relative: 17 %
Lymphs Abs: 1.2 10*3/uL (ref 0.7–4.0)
MCH: 28.2 pg (ref 26.0–34.0)
MCHC: 32.1 g/dL (ref 30.0–36.0)
MCV: 87.7 fL (ref 80.0–100.0)
Monocytes Absolute: 0.7 10*3/uL (ref 0.1–1.0)
Monocytes Relative: 11 %
Neutro Abs: 4.7 10*3/uL (ref 1.7–7.7)
Neutrophils Relative %: 67 %
Platelets: 259 10*3/uL (ref 150–400)
RBC: 3.83 MIL/uL — ABNORMAL LOW (ref 4.22–5.81)
RDW: 13.7 % (ref 11.5–15.5)
WBC: 7 10*3/uL (ref 4.0–10.5)
nRBC: 0 % (ref 0.0–0.2)

## 2019-11-21 LAB — SARS CORONAVIRUS 2 (TAT 6-24 HRS): SARS Coronavirus 2: NEGATIVE

## 2019-11-21 NOTE — Progress Notes (Signed)
[facility] Perioperative Services  Pre-Admission/Anesthesia Testing Clinical Review  Date: 11/21/19  Patient Demographics:  Name: Tyler [name] DOB:   1955-07-07 MRN:   [id number]  Planned Surgical Procedure(s):    Case: [id number] Date/Time: 11/22/19 1115   Procedure: AMPUTATION BELOW KNEE (Left Knee)   Anesthesia type: General   Pre-op diagnosis: GANGRENE LEFT FOOT   Location: [facility]   Surgeons: Algernon Huxley, MD     NOTE: Available PAT nursing documentation and vital signs have been reviewed. Clinical nursing staff has updated patient's PMH/PSHx, current medication list, and drug allergies/intolerances to ensure comprehensive history available to assist in medical decision making as it pertains to the aforementioned surgical procedure and anticipated anesthetic course.   Clinical Discussion:  Tyler [name] is a 64 y.o. male who is submitted for pre-surgical anesthesia review and clearance prior to him undergoing the above procedure. Patient is a Former Smoker (42 pack years; quit 09/2013). Pertinent PMH includes: CAD, CHF, HTN, PVD, NYHA class III, dilated cardiomyopathy, MI (2004; s/p PCI and AICD placement), A.fib, DVT, COPD, OSAH (pending PSG testing), oxygen dependence, cervical spinal stenosis, Factor V leiden (heterozygous; on daily warfarin as he allergic to DOACs), anxiety, previous lower extremity/foot amputations.   Patient is followed by cardiology Nehemiah Massed, MD). He was last seen in clinic on 07/26/2019; notes reviewed. Patient stable overall. Patient denied chest pain and shortness of breath. Patient continued on chronic anticoagulation therapy as prescribed. AICD/pacemaker interrogated and required no re-programming. Dr. Nehemiah Massed noted that patient may "proceed to surgery and/or invasive procedure without restriction. The patient is at the lowest risk possible for cardiovascular complication". Patient to  follow up with cardiology on 12/05/2019. He has been instructed on recommendations for holding his warfarin for 3 days prior to his procedure. The patient has been instructed that his last dose of his anticoagulant will be on 11/19/2019.  He denies previous intra-operative complications with anesthesia. Patient underwent procedures here at [facility] on 10/06/2019 and 10/20/2019 (ASA IV) without anesthetic complications. 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 Recent CT imaging of the chest performed on 11/03/2019 gives rise to concern for possible aspiration. PCP has scheduled patient for a formal swallow study to further evaluate. Of note, CT imaging also made mention of small pericardial effusion (see full results below).   Vitals with BMI 11/20/2019 11/17/2019 11/10/2019  Height 6\' 1"  - 6\' 1"   Weight 165 lbs - 165 lbs  BMI 50.27 - 74.12  Systolic - 878 676  Diastolic - 75 73  Pulse - 76 79    Providers/Specialists:   PROVIDER ROLE LAST Imelda Pillow, MD Vascular surgeon 11/17/2019  [name], MD Primary Care Provider 11/02/2019  Serafina Royals, MD Cardiology 07/26/2019   Allergies:  Apixaban and Rivaroxaban  Current Home Medications:   No current facility-administered medications for this encounter.   Marland Kitchen acetaminophen (TYLENOL) 500 MG tablet  . ALPRAZolam (XANAX) 1 MG tablet  . amoxicillin-clavulanate (AUGMENTIN) 875-125 MG tablet  . aspirin EC 81 MG tablet  . citalopram (CELEXA) 10 MG tablet  . docusate sodium (COLACE) 100 MG capsule  . ENTRESTO 24-26 MG  . fluticasone (FLONASE) 50 MCG/ACT nasal spray  . fluticasone-salmeterol (ADVAIR HFA) 115-21 MCG/ACT inhaler  . furosemide (LASIX) 20 MG tablet  . gabapentin (NEURONTIN) 100 MG capsule  . guaiFENesin (MUCINEX) 600 MG 12 hr tablet  . ipratropium (ATROVENT) 0.03 % nasal spray  . Ipratropium-Albuterol (COMBIVENT  RESPIMAT) 20-100 MCG/ACT AERS respimat  . levalbuterol (XOPENEX) 1.25 MG/3ML nebulizer solution  .  loratadine (CLARITIN) 10 MG tablet  . lovastatin (MEVACOR) 20 MG tablet  . metoprolol succinate (TOPROL-XL) 25 MG 24 hr tablet  . nitroGLYCERIN (NITROSTAT) 0.4 MG SL tablet  . oxyCODONE-acetaminophen (PERCOCET) 7.5-325 MG tablet  . pantoprazole (PROTONIX) 40 MG tablet  . Respiratory Therapy Supplies (FLUTTER) DEVI  . SPIRIVA RESPIMAT 1.25 MCG/ACT AERS  . tamsulosin (FLOMAX) 0.4 MG CAPS capsule  . vitamin B-12 (CYANOCOBALAMIN) 1000 MCG tablet  . warfarin (COUMADIN) 5 MG tablet  . Wound Dressings (RESTORE WOUND CARE DRESSING) PADS   History:   Past Medical History:  Diagnosis Date  . AICD (automatic cardioverter/defibrillator) present   . Anxiety   . Arthritis   . Atherosclerosis of artery of extremity with ulceration ([place]) 09/2019   left foot s/p toe amp requiring debridement and futher toe amputations.  . Atrial fibrillation ([place])   . Cervical spinal stenosis    with neuropathy  . CHF (congestive heart failure) ([place])   . Constipation   . COPD (chronic obstructive pulmonary disease) ([place])   . Coronary artery disease   . Depression   . Dyspnea   . Dysrhythmia    atrial fibrillation  . Emphysema of lung ([place])   . Factor 5 Leiden mutation, heterozygous ([place])    on coumadin  . GERD (gastroesophageal reflux disease)   . Hypertension   . Lung nodule seen on imaging study    being followed by dr. [name]. just watching it for last few years, without change  . Myocardial infarction [facility]) 2004   stent placed, pacemaker implanted 2005  . Osteomyelitis ([place]) 10/2019   left foot  . Oxygen dependent    requires 2L nasal prong oxygen 24 hours a day  . Peripheral vascular disease ([place])   . Presence of permanent cardiac pacemaker 2005,2018  . Sleep apnea    waiting to have sleep study. Used bipap while hospitalized and said it was great for him.   Past Surgical History:  Procedure Laterality Date  . ABOVE KNEE LEG AMPUTATION Right    after below the knee amputation   . AMPUTATION  TOE Left 08/04/2019   Procedure: AMPUTATION TOE MPJ LEFT;  Surgeon: Samara Deist, DPM;  Location: [facility] OR[facility];  Service: Podiatry;  Laterality: Left;  . AMPUTATION TOE Left 10/06/2019   Procedure: AMPUTATION TOE MPJ T1,T2 LEFT;  Surgeon: Samara Deist, DPM;  Location: [facility];  Service: Podiatry;  Laterality: Left;  . APPLICATION OF WOUND VAC Left 01/12/2018   Procedure: APPLICATION OF WOUND VAC;  Surgeon: Algernon Huxley, MD;  Location: [facility];  Service: Vascular;  Laterality: Left;  . BELOW KNEE LEG AMPUTATION Right   . CATARACT EXTRACTION, BILATERAL Bilateral   . CORONARY ANGIOPLASTY  2005   stent x 1 placed  . ENDARTERECTOMY FEMORAL Left 08/11/2017   Procedure: ENDARTERECTOMY FEMORAL;  Surgeon: Algernon Huxley, MD;  Location: [facility];  Service: Vascular;  Laterality: Left;  . HEMATOMA EVACUATION Left 01/12/2018   Procedure: EVACUATION HEMATOMA ( DRAINING OF SEROMA);  Surgeon: Algernon Huxley, MD;  Location: [facility];  Service: Vascular;  Laterality: Left;  . IMPLANTABLE CARDIOVERTER DEFIBRILLATOR (ICD) GENERATOR CHANGE Left 02/10/2017   Procedure: ICD GENERATOR CHANGE;  Surgeon: Isaias Cowman, MD;  Location: [facility];  Service: Cardiovascular;  Laterality: Left;  . INSERT / REPLACE / REMOVE PACEMAKER  3267,1245, 2018  . LOWER EXTREMITY ANGIOGRAPHY Left 06/07/2017   Procedure: LOWER EXTREMITY  ANGIOGRAPHY;  Surgeon: Algernon Huxley, MD;  Location: [place] [facility];  Service: Cardiovascular;  Laterality: Left;  . LOWER EXTREMITY ANGIOGRAPHY Left 10/05/2019   Procedure: LOWER EXTREMITY ANGIOGRAPHY;  Surgeon: Algernon Huxley, MD;  Location: [place] [facility];  Service: Cardiovascular;  Laterality: Left;  . LOWER EXTREMITY INTERVENTION  06/07/2017   Procedure: LOWER EXTREMITY INTERVENTION;  Surgeon: Algernon Huxley, MD;  Location: [place] [facility];  Service: Cardiovascular;;  . PERIPHERAL VASCULAR CATHETERIZATION Left 12/09/2015   Procedure: Lower Extremity Angiography;  Surgeon: Algernon Huxley, MD;   Location: [place] [facility];  Service: Cardiovascular;  Laterality: Left;  . PERIPHERAL VASCULAR CATHETERIZATION  12/09/2015   Procedure: Lower Extremity Intervention;  Surgeon: Algernon Huxley, MD;  Location: [place] [facility];  Service: Cardiovascular;;  . TONSILLECTOMY    . TRANSMETATARSAL AMPUTATION Left 10/20/2019   Procedure: TRANSMETATARSAL AMPUTATION;  Surgeon: Sharlotte Alamo, DPM;  Location: [facility];  Service: Podiatry;  Laterality: Left;   Family History  Problem Relation Age of Onset  . Cancer Mother   . Cancer Father   . Heart disease Father    Social History   Tobacco Use  . Smoking status: Former Smoker    Packs/day: 1.00    Years: 42.00    Pack years: 42.00    Types: Cigarettes    Quit date: 09/23/2013    Years since quitting: 6.1  . Smokeless tobacco: Never Used  Vaping Use  . Vaping Use: Never used  Substance Use Topics  . Alcohol use: No    Comment: quit 6 years ago  . Drug use: No    Pertinent Clinical Results:  No orders of the defined types were placed in this encounter.  LABS:  Hospital Outpatient Visit on 11/21/2019  Component Date Value Ref Range Status  . aPTT 11/21/2019 36  24 - 36 seconds Final   Performed at [facility], [address] [place], [place] [address]  . Sodium 11/21/2019 131* 135 - 145 mmol/L Final  . Potassium 11/21/2019 4.2  3.5 - 5.1 mmol/L Final  . Chloride 11/21/2019 92* 98 - 111 mmol/L Final  . CO2 11/21/2019 28  22 - 32 mmol/L Final  . Glucose, Bld 11/21/2019 99  70 - 99 mg/dL Final   Glucose reference range applies only to samples taken after fasting for at least 8 hours.  . BUN 11/21/2019 9  8 - 23 mg/dL Final  . Creatinine, Ser 11/21/2019 0.84  0.61 - 1.24 mg/dL Final  . Calcium 11/21/2019 9.1  8.9 - 10.3 mg/dL Final  . GFR calc non Af Amer 11/21/2019 >60  >60 mL/min Final  . GFR calc Af Amer 11/21/2019 >60  >60 mL/min Final  . Anion gap 11/21/2019 11  5 - 15 Final   Performed at [facility], [address], [place], [place] [address]  . WBC 11/21/2019 7.0  4.0 - 10.5 K/uL Final  . RBC 11/21/2019 3.83* 4.22 - 5.81 MIL/uL Final  . Hemoglobin 11/21/2019 10.8* 13.0 - 17.0 g/dL Final  . HCT 11/21/2019 33.6* 39 - 52 % Final  . MCV 11/21/2019 87.7  80.0 - 100.0 fL Final  . MCH 11/21/2019 28.2  26.0 - 34.0 pg Final  . MCHC 11/21/2019 32.1  30.0 - 36.0 g/dL Final  . RDW 11/21/2019 13.7  11.5 - 15.5 % Final  . Platelets 11/21/2019 259  150 - 400 K/uL Final  . nRBC 11/21/2019 0.0  0.0 - 0.2 %  Final  . Neutrophils Relative % 11/21/2019 67  % Final  . Neutro Abs 11/21/2019 4.7  1.7 - 7.7 K/uL Final  . Lymphocytes Relative 11/21/2019 17  % Final  . Lymphs Abs 11/21/2019 1.2  0.7 - 4.0 K/uL Final  . Monocytes Relative 11/21/2019 11  % Final  . Monocytes Absolute 11/21/2019 0.7  0 - 1 K/uL Final  . Eosinophils Relative 11/21/2019 4  % Final  . Eosinophils Absolute 11/21/2019 0.3  0 - 0 K/uL Final  . Basophils Relative 11/21/2019 1  % Final  . Basophils Absolute 11/21/2019 0.1  0 - 0 K/uL Final  . Immature Granulocytes 11/21/2019 0  % Final  . Abs Immature Granulocytes 11/21/2019 0.02  0.00 - 0.07 K/uL Final   Performed at [facility], [address] [place], [place] [address]  . Prothrombin Time 11/21/2019 19.3* 11.4 - 15.2 seconds Final  . INR 11/21/2019 1.7* 0.8 - 1.2 Final   Comment: (NOTE) INR goal varies based on device and disease states. Performed at [facility], [address], [place], [place] [address]   . ABO/RH(D) 11/21/2019 A POS   Final  . Antibody Screen 11/21/2019 NEG   Final  . Sample Expiration 11/21/2019 12/05/2019,2359   Final  . Extend sample reason 11/21/2019    Final                   Value:NO TRANSFUSIONS OR PREGNANCY IN THE PAST 3 MONTHS Performed at [facility], [address] [place], [place] [address]    EKG: Date: 10/25/2019 Time ECG obtained: 0914 AM Rate: 76 bpm Rhythm: A.fib/flutter; V-paced Axis (leads I and  aVF): Normal Intervals: QTc 484 ms. ST segment and T wave changes: No evidence of ST segment elevation or depression Comparison: SR with RBBB noted on 10/18/2019 tracing.    IMAGING / PROCEDURES: CT CHEST WITHOUT CONTRAST done on 11/03/2019 1. Signs of pulmonary emphysema both paraseptal and centrilobular with area of nodularity and linear opacity in the RIGHT upper lobe that has been present since at 2015 and reportedly earlier without change. 2. Small 4 mm nodule along the posterior margin of the RIGHT upperchest new since 2019 unchanged since June of 2020 and stable for 1 years time, likely benign. 3. Material and or secretions in RIGHT lower lobe bronchus and RIGHT mainstem bronchus, correlate with any risk for aspiration. 4. Small pericardial effusion similar to previous study from June of 2021. 5. Three-vessel coronary artery disease. 6. Emphysema and aortic atherosclerosis.  ECHOCARDIOGRAM done on 10/25/2019 1. Left ventricular ejection fraction, by estimation, is 55 to 60%. Left ventricular ejection fraction by PLAX is 62 %. The left ventricle has normal function. The left ventricle demonstrates regional wall motion abnormalities. Left ventricular diastolic parameters were normal. 2. Right ventricular systolic function is normal. The right ventricular size is normal.  3. Left atrial size was moderately dilated.  4. The mitral valve was not well visualized. Mild to moderate mitral valve regurgitation.  5. The aortic valve was not well visualized. Aortic valve regurgitation is trivial.    Impression and Plan:  [name] has been referred for pre-anesthesia review and clearance prior him undergoing the planned anesthetic and procedural courses. Available labs, pertinent testing, and imaging results were personally reviewed by me. This patient has been appropriately cleared by cardiology. Patient has an PPM in place. Awaiting faxed perioperative device orders, however I spoke with  Dr. Nehemiah Massed (cardiology) personally and was advised that the pacer  would not interfere and that patient could proceed as planned. MD to have office staff fax official cardiac device orders over in the morning; will place on chart and discuss with SDS charge RN once received.   Based on clinical review performed today (11/21/19), barring and significant acute changes in the patient's overall condition, it is anticipated that he will be able to proceed with the planned surgical intervention. Any acute changes in clinical condition may necessitate his procedure being postponed and/or cancelled. Pre-surgical instructions were reviewed with the patient during his PAT appointment and questions were fielded by PAT clinical staff.  Honor Loh, MSN, APRN, FNP-C, CEN [facility]  Peri-operative Services Nurse Practitioner Phone: [phone] 11/21/19 7:10 PM  NOTE: This note has been prepared using Dragon dictation software. Despite my best ability to proofread, there is always the potential that unintentional transcriptional errors may still occur from this process.

## 2019-11-21 NOTE — Progress Notes (Addendum)
  [facility] Perioperative Services: Pre-Admission/Anesthesia Testing  Abnormal Lab Notification  Date: 11/21/19  Name: Tyler [name] MRN:   [id number]  Re: Abnormal labs noted during PAT appointment  Provider Notified: Eulogio Ditch, NP Notification mode: Routed via [facility] of concern:  Hgb 10.8 and Hct 33.6  Na 131 mmol/L  PT 19.3 and INR 1.7 (last dose of warfarin was on 11/19/2019)  Honor Loh, MSN, APRN, FNP-C, CEN [place]  Peri-operative Services Nurse Practitioner Phone: [phone] 11/21/19 2:57 PM

## 2019-11-21 NOTE — Addendum Note (Signed)
Addended by: Claudette Head A on: 11/21/2019 08:11 AM   Modules accepted: Orders

## 2019-11-21 NOTE — Telephone Encounter (Signed)
Barium swallow has been ordered.  Nothing further is needed.

## 2019-11-22 ENCOUNTER — Encounter: Payer: Self-pay | Admitting: Vascular Surgery

## 2019-11-22 ENCOUNTER — Inpatient Hospital Stay: Payer: Medicare HMO | Admitting: Urgent Care

## 2019-11-22 ENCOUNTER — Other Ambulatory Visit: Payer: Self-pay

## 2019-11-22 ENCOUNTER — Inpatient Hospital Stay
Admission: RE | Admit: 2019-11-22 | Discharge: 2019-11-30 | DRG: 240 | Disposition: A | Discharge status: Home Health | Payer: Medicare HMO | Attending: Vascular Surgery | Admitting: Vascular Surgery

## 2019-11-22 ENCOUNTER — Encounter
Admission: RE | Disposition: A | Discharge status: Home Health | Payer: Self-pay | Source: Home / Self Care | Attending: Vascular Surgery

## 2019-11-22 DIAGNOSIS — Z89611 Acquired absence of right leg above knee: Secondary | ICD-10-CM | POA: Diagnosis not present

## 2019-11-22 DIAGNOSIS — F329 Major depressive disorder, single episode, unspecified: Secondary | ICD-10-CM | POA: Diagnosis present

## 2019-11-22 DIAGNOSIS — Z7901 Long term (current) use of anticoagulants: Secondary | ICD-10-CM

## 2019-11-22 DIAGNOSIS — Z79899 Other long term (current) drug therapy: Secondary | ICD-10-CM

## 2019-11-22 DIAGNOSIS — J441 Chronic obstructive pulmonary disease with (acute) exacerbation: Secondary | ICD-10-CM | POA: Diagnosis not present

## 2019-11-22 DIAGNOSIS — I4891 Unspecified atrial fibrillation: Secondary | ICD-10-CM | POA: Diagnosis present

## 2019-11-22 DIAGNOSIS — I429 Cardiomyopathy, unspecified: Secondary | ICD-10-CM | POA: Diagnosis present

## 2019-11-22 DIAGNOSIS — I96 Gangrene, not elsewhere classified: Secondary | ICD-10-CM | POA: Diagnosis not present

## 2019-11-22 DIAGNOSIS — I2510 Atherosclerotic heart disease of native coronary artery without angina pectoris: Secondary | ICD-10-CM | POA: Diagnosis present

## 2019-11-22 DIAGNOSIS — K219 Gastro-esophageal reflux disease without esophagitis: Secondary | ICD-10-CM | POA: Diagnosis present

## 2019-11-22 DIAGNOSIS — I70262 Atherosclerosis of native arteries of extremities with gangrene, left leg: Principal | ICD-10-CM | POA: Diagnosis present

## 2019-11-22 DIAGNOSIS — Z7952 Long term (current) use of systemic steroids: Secondary | ICD-10-CM

## 2019-11-22 DIAGNOSIS — I110 Hypertensive heart disease with heart failure: Secondary | ICD-10-CM | POA: Diagnosis present

## 2019-11-22 DIAGNOSIS — Z95810 Presence of automatic (implantable) cardiac defibrillator: Secondary | ICD-10-CM | POA: Diagnosis not present

## 2019-11-22 DIAGNOSIS — Z7951 Long term (current) use of inhaled steroids: Secondary | ICD-10-CM | POA: Diagnosis not present

## 2019-11-22 DIAGNOSIS — D6851 Activated protein C resistance: Secondary | ICD-10-CM | POA: Diagnosis present

## 2019-11-22 DIAGNOSIS — Z87891 Personal history of nicotine dependence: Secondary | ICD-10-CM | POA: Diagnosis not present

## 2019-11-22 DIAGNOSIS — Z20822 Contact with and (suspected) exposure to covid-19: Secondary | ICD-10-CM | POA: Diagnosis present

## 2019-11-22 DIAGNOSIS — F419 Anxiety disorder, unspecified: Secondary | ICD-10-CM | POA: Diagnosis present

## 2019-11-22 DIAGNOSIS — Z9981 Dependence on supplemental oxygen: Secondary | ICD-10-CM | POA: Diagnosis not present

## 2019-11-22 DIAGNOSIS — D62 Acute posthemorrhagic anemia: Secondary | ICD-10-CM | POA: Diagnosis not present

## 2019-11-22 DIAGNOSIS — E785 Hyperlipidemia, unspecified: Secondary | ICD-10-CM | POA: Diagnosis present

## 2019-11-22 DIAGNOSIS — I252 Old myocardial infarction: Secondary | ICD-10-CM | POA: Diagnosis not present

## 2019-11-22 DIAGNOSIS — Z955 Presence of coronary angioplasty implant and graft: Secondary | ICD-10-CM

## 2019-11-22 DIAGNOSIS — I509 Heart failure, unspecified: Secondary | ICD-10-CM | POA: Diagnosis present

## 2019-11-22 DIAGNOSIS — I70269 Atherosclerosis of native arteries of extremities with gangrene, unspecified extremity: Secondary | ICD-10-CM | POA: Diagnosis present

## 2019-11-22 DIAGNOSIS — Z7982 Long term (current) use of aspirin: Secondary | ICD-10-CM | POA: Diagnosis not present

## 2019-11-22 DIAGNOSIS — R0902 Hypoxemia: Secondary | ICD-10-CM

## 2019-11-22 HISTORY — PX: AMPUTATION: SHX166

## 2019-11-22 LAB — CBC
HCT: 30.8 % — ABNORMAL LOW (ref 39.0–52.0)
Hemoglobin: 9.7 g/dL — ABNORMAL LOW (ref 13.0–17.0)
MCH: 28 pg (ref 26.0–34.0)
MCHC: 31.5 g/dL (ref 30.0–36.0)
MCV: 88.8 fL (ref 80.0–100.0)
Platelets: 229 10*3/uL (ref 150–400)
RBC: 3.47 MIL/uL — ABNORMAL LOW (ref 4.22–5.81)
RDW: 13.7 % (ref 11.5–15.5)
WBC: 10.6 10*3/uL — ABNORMAL HIGH (ref 4.0–10.5)
nRBC: 0 % (ref 0.0–0.2)

## 2019-11-22 LAB — URINALYSIS, ROUTINE W REFLEX MICROSCOPIC
Bacteria, UA: NONE SEEN
Bilirubin Urine: NEGATIVE
Glucose, UA: 150 mg/dL — AB
Ketones, ur: NEGATIVE mg/dL
Leukocytes,Ua: NEGATIVE
Nitrite: NEGATIVE
Protein, ur: NEGATIVE mg/dL
Specific Gravity, Urine: 1.006 (ref 1.005–1.030)
Squamous Epithelial / HPF: NONE SEEN (ref 0–5)
pH: 7 (ref 5.0–8.0)

## 2019-11-22 LAB — PROTIME-INR
INR: 1.2 (ref 0.8–1.2)
Prothrombin Time: 14.8 seconds (ref 11.4–15.2)

## 2019-11-22 LAB — COMPREHENSIVE METABOLIC PANEL
ALT: 11 U/L (ref 0–44)
AST: 17 U/L (ref 15–41)
Albumin: 3.6 g/dL (ref 3.5–5.0)
Alkaline Phosphatase: 67 U/L (ref 38–126)
Anion gap: 9 (ref 5–15)
BUN: 8 mg/dL (ref 8–23)
CO2: 27 mmol/L (ref 22–32)
Calcium: 8.7 mg/dL — ABNORMAL LOW (ref 8.9–10.3)
Chloride: 92 mmol/L — ABNORMAL LOW (ref 98–111)
Creatinine, Ser: 0.77 mg/dL (ref 0.61–1.24)
GFR calc Af Amer: >60 mL/min (ref >60)
GFR calc non Af Amer: >60 mL/min (ref >60)
Glucose, Bld: 196 mg/dL — ABNORMAL HIGH (ref 70–99)
Potassium: 4.2 mmol/L (ref 3.5–5.1)
Sodium: 128 mmol/L — ABNORMAL LOW (ref 135–145)
Total Bilirubin: 0.9 mg/dL (ref 0.3–1.2)
Total Protein: 6.6 g/dL (ref 6.5–8.1)

## 2019-11-22 LAB — MRSA PCR SCREENING: MRSA by PCR: NEGATIVE

## 2019-11-22 SURGERY — AMPUTATION BELOW KNEE
Anesthesia: General | Site: Knee | Laterality: Left

## 2019-11-22 MED ORDER — CHLORHEXIDINE GLUCONATE CLOTH 2 % EX PADS: 6.0000 | MEDICATED_PAD | Freq: Once | CUTANEOUS | Status: DC

## 2019-11-22 MED ORDER — CHLORHEXIDINE GLUCONATE 0.12 % MT SOLN: 15.0000 mL | Freq: Once | OROMUCOSAL | Status: AC

## 2019-11-22 MED ORDER — CEFAZOLIN SODIUM-DEXTROSE 2-4 GM/100ML-% IV SOLN
2.0000 g | INTRAVENOUS | Status: AC
  Administered 2019-11-22: 2 g via INTRAVENOUS

## 2019-11-22 MED ORDER — TIOTROPIUM BROMIDE MONOHYDRATE 18 MCG IN CAPS
1.0000 | ORAL_CAPSULE | Freq: Every day | RESPIRATORY_TRACT | Status: DC
  Administered 2019-11-23: 18 ug via RESPIRATORY_TRACT
  Filled 2019-11-22: qty 5

## 2019-11-22 MED ORDER — OXYCODONE HCL 5 MG PO TABS
5.0000 mg | ORAL_TABLET | Freq: Once | ORAL | Status: AC | PRN
  Administered 2019-11-22: 5 mg via ORAL

## 2019-11-22 MED ORDER — LABETALOL HCL 5 MG/ML IV SOLN: 10.0000 mg | INTRAVENOUS | Status: DC | PRN

## 2019-11-22 MED ORDER — SACUBITRIL-VALSARTAN 24-26 MG PO TABS
1.0000 | ORAL_TABLET | Freq: Two times a day (BID) | ORAL | Status: DC
  Administered 2019-11-22 – 2019-11-30 (×10): 1 via ORAL
  Filled 2019-11-22 (×18): qty 1

## 2019-11-22 MED ORDER — FENTANYL CITRATE (PF) 100 MCG/2ML IJ SOLN
25.0000 ug | INTRAMUSCULAR | Status: AC | PRN
  Administered 2019-11-22 (×5): 25 ug via INTRAVENOUS

## 2019-11-22 MED ORDER — TAMSULOSIN HCL 0.4 MG PO CAPS
0.4000 mg | ORAL_CAPSULE | Freq: Every day | ORAL | Status: DC
  Administered 2019-11-22 – 2019-11-29 (×9): 0.4 mg via ORAL
  Filled 2019-11-22 (×8): qty 1

## 2019-11-22 MED ORDER — ACETAMINOPHEN 10 MG/ML IV SOLN
INTRAVENOUS | Status: AC
  Filled 2019-11-22: qty 100

## 2019-11-22 MED ORDER — PROPOFOL 10 MG/ML IV BOLUS
INTRAVENOUS | Status: AC
  Filled 2019-11-22: qty 20

## 2019-11-22 MED ORDER — OXYCODONE HCL 5 MG/5ML PO SOLN: 5.0000 mg | Freq: Once | ORAL | Status: AC | PRN

## 2019-11-22 MED ORDER — WARFARIN SODIUM 5 MG PO TABS
5.0000 mg | ORAL_TABLET | Freq: Every day | ORAL | Status: DC
  Administered 2019-11-22 – 2019-11-30 (×8): 5 mg via ORAL
  Filled 2019-11-22 (×10): qty 1

## 2019-11-22 MED ORDER — DEXMEDETOMIDINE HCL 200 MCG/2ML IV SOLN
INTRAVENOUS | Status: DC | PRN
  Administered 2019-11-22: 12 ug via INTRAVENOUS

## 2019-11-22 MED ORDER — PHENYLEPHRINE HCL (PRESSORS) 10 MG/ML IV SOLN
INTRAVENOUS | Status: DC | PRN
  Administered 2019-11-22 (×2): 200 ug via INTRAVENOUS
  Administered 2019-11-22: 100 ug via INTRAVENOUS
  Administered 2019-11-22: 200 ug via INTRAVENOUS
  Administered 2019-11-22 (×2): 100 ug via INTRAVENOUS
  Administered 2019-11-22 (×2): 200 ug via INTRAVENOUS

## 2019-11-22 MED ORDER — ONDANSETRON HCL 4 MG/2ML IJ SOLN
INTRAMUSCULAR | Status: DC | PRN
  Administered 2019-11-22: 4 mg via INTRAVENOUS

## 2019-11-22 MED ORDER — GUAIFENESIN ER 600 MG PO TB12
600.0000 mg | ORAL_TABLET | Freq: Every evening | ORAL | Status: DC
  Administered 2019-11-22 – 2019-11-29 (×8): 600 mg via ORAL
  Filled 2019-11-22 (×8): qty 1

## 2019-11-22 MED ORDER — CHLORHEXIDINE GLUCONATE 0.12 % MT SOLN
OROMUCOSAL | Status: AC
  Administered 2019-11-22: 15 mL via OROMUCOSAL
  Filled 2019-11-22: qty 15

## 2019-11-22 MED ORDER — PRAVASTATIN SODIUM 20 MG PO TABS
10.0000 mg | ORAL_TABLET | Freq: Every day | ORAL | Status: DC
  Administered 2019-11-22 – 2019-11-29 (×8): 10 mg via ORAL
  Filled 2019-11-22 (×8): qty 1

## 2019-11-22 MED ORDER — GLYCOPYRROLATE 0.2 MG/ML IJ SOLN
INTRAMUSCULAR | Status: DC | PRN
  Administered 2019-11-22: .1 mg via INTRAVENOUS

## 2019-11-22 MED ORDER — IPRATROPIUM-ALBUTEROL 0.5-2.5 (3) MG/3ML IN SOLN
RESPIRATORY_TRACT | Status: AC
  Filled 2019-11-22: qty 3

## 2019-11-22 MED ORDER — MORPHINE SULFATE (PF) 2 MG/ML IV SOLN
2.0000 mg | INTRAVENOUS | Status: DC | PRN
  Administered 2019-11-22: 2 mg via INTRAVENOUS
  Administered 2019-11-23: 4 mg via INTRAVENOUS
  Administered 2019-11-23: 2 mg via INTRAVENOUS
  Administered 2019-11-23 (×5): 4 mg via INTRAVENOUS
  Administered 2019-11-24 – 2019-11-27 (×9): 2 mg via INTRAVENOUS
  Filled 2019-11-22 (×3): qty 2
  Filled 2019-11-22 (×3): qty 1
  Filled 2019-11-22 (×2): qty 2
  Filled 2019-11-22 (×6): qty 1
  Filled 2019-11-22: qty 2
  Filled 2019-11-22: qty 1
  Filled 2019-11-22: qty 2
  Filled 2019-11-22: qty 1

## 2019-11-22 MED ORDER — FLUTICASONE PROPIONATE 50 MCG/ACT NA SUSP
2.0000 | Freq: Every day | NASAL | Status: DC
  Administered 2019-11-23 – 2019-11-30 (×9): 2 via NASAL
  Filled 2019-11-22: qty 16

## 2019-11-22 MED ORDER — LORATADINE 10 MG PO TABS
10.0000 mg | ORAL_TABLET | Freq: Every day | ORAL | Status: DC
  Administered 2019-11-22 – 2019-11-29 (×8): 10 mg via ORAL
  Filled 2019-11-22 (×8): qty 1

## 2019-11-22 MED ORDER — GABAPENTIN 300 MG PO CAPS
300.0000 mg | ORAL_CAPSULE | Freq: Two times a day (BID) | ORAL | Status: DC
  Administered 2019-11-22 – 2019-11-30 (×16): 300 mg via ORAL
  Filled 2019-11-22 (×16): qty 1

## 2019-11-22 MED ORDER — DOCUSATE SODIUM 100 MG PO CAPS
100.0000 mg | ORAL_CAPSULE | Freq: Every day | ORAL | Status: DC | PRN
  Administered 2019-11-23 – 2019-11-27 (×4): 100 mg via ORAL
  Filled 2019-11-22 (×5): qty 1

## 2019-11-22 MED ORDER — GLYCOPYRROLATE 0.2 MG/ML IJ SOLN
INTRAMUSCULAR | Status: AC
  Filled 2019-11-22: qty 1

## 2019-11-22 MED ORDER — OXYCODONE-ACETAMINOPHEN 7.5-325 MG PO TABS: 1.0000 | ORAL_TABLET | Freq: Four times a day (QID) | ORAL | Status: DC | PRN

## 2019-11-22 MED ORDER — ONDANSETRON HCL 4 MG/2ML IJ SOLN: 4.0000 mg | Freq: Once | INTRAMUSCULAR | Status: DC | PRN

## 2019-11-22 MED ORDER — ASPIRIN EC 81 MG PO TBEC
81.0000 mg | DELAYED_RELEASE_TABLET | Freq: Every day | ORAL | Status: DC
  Administered 2019-11-22 – 2019-11-29 (×8): 81 mg via ORAL
  Filled 2019-11-22 (×8): qty 1

## 2019-11-22 MED ORDER — DEXAMETHASONE SODIUM PHOSPHATE 10 MG/ML IJ SOLN
INTRAMUSCULAR | Status: DC | PRN
  Administered 2019-11-22: 10 mg via INTRAVENOUS

## 2019-11-22 MED ORDER — FENTANYL CITRATE (PF) 100 MCG/2ML IJ SOLN
INTRAMUSCULAR | Status: AC
  Filled 2019-11-22: qty 2

## 2019-11-22 MED ORDER — ACETAMINOPHEN 500 MG PO TABS
1000.0000 mg | ORAL_TABLET | Freq: Every day | ORAL | Status: DC | PRN
  Administered 2019-11-24: 1000 mg via ORAL
  Filled 2019-11-22: qty 2

## 2019-11-22 MED ORDER — METOPROLOL SUCCINATE ER 25 MG PO TB24
25.0000 mg | ORAL_TABLET | Freq: Every day | ORAL | Status: DC
  Administered 2019-11-23 – 2019-11-30 (×6): 25 mg via ORAL
  Filled 2019-11-22 (×8): qty 1

## 2019-11-22 MED ORDER — ALPRAZOLAM 0.5 MG PO TABS
1.0000 mg | ORAL_TABLET | Freq: Two times a day (BID) | ORAL | Status: DC | PRN
  Administered 2019-11-22 – 2019-11-29 (×7): 1 mg via ORAL
  Filled 2019-11-22 (×7): qty 2

## 2019-11-22 MED ORDER — CITALOPRAM HYDROBROMIDE 20 MG PO TABS
10.0000 mg | ORAL_TABLET | Freq: Every day | ORAL | Status: DC
  Administered 2019-11-22 – 2019-11-30 (×9): 10 mg via ORAL
  Filled 2019-11-22 (×9): qty 1

## 2019-11-22 MED ORDER — AMOXICILLIN-POT CLAVULANATE 875-125 MG PO TABS
1.0000 | ORAL_TABLET | Freq: Two times a day (BID) | ORAL | Status: DC
  Administered 2019-11-22 – 2019-11-29 (×14): 1 via ORAL
  Filled 2019-11-22 (×14): qty 1

## 2019-11-22 MED ORDER — ACETAMINOPHEN 10 MG/ML IV SOLN
INTRAVENOUS | Status: DC | PRN
  Administered 2019-11-22: 1000 mg via INTRAVENOUS

## 2019-11-22 MED ORDER — OXYCODONE HCL 5 MG PO TABS
ORAL_TABLET | ORAL | Status: AC
  Filled 2019-11-22: qty 1

## 2019-11-22 MED ORDER — MOMETASONE FURO-FORMOTEROL FUM 200-5 MCG/ACT IN AERO
2.0000 | INHALATION_SPRAY | Freq: Two times a day (BID) | RESPIRATORY_TRACT | Status: DC
  Administered 2019-11-22 – 2019-11-30 (×15): 2 via RESPIRATORY_TRACT
  Filled 2019-11-22: qty 8.8

## 2019-11-22 MED ORDER — FENTANYL CITRATE (PF) 100 MCG/2ML IJ SOLN
INTRAMUSCULAR | Status: AC
  Administered 2019-11-22: 25 ug via INTRAVENOUS
  Filled 2019-11-22: qty 2

## 2019-11-22 MED ORDER — SODIUM CHLORIDE (PF) 0.9 % IJ SOLN
INTRAMUSCULAR | Status: AC
  Filled 2019-11-22: qty 10

## 2019-11-22 MED ORDER — GUAIFENESIN-DM 100-10 MG/5ML PO SYRP: 15.0000 mL | ORAL_SOLUTION | ORAL | Status: DC | PRN

## 2019-11-22 MED ORDER — CEFAZOLIN SODIUM-DEXTROSE 2-4 GM/100ML-% IV SOLN
INTRAVENOUS | Status: AC
  Filled 2019-11-22: qty 100

## 2019-11-22 MED ORDER — KETAMINE HCL 10 MG/ML IJ SOLN
INTRAMUSCULAR | Status: DC | PRN
  Administered 2019-11-22: 20 mg via INTRAVENOUS
  Administered 2019-11-22 (×2): 10 mg via INTRAVENOUS

## 2019-11-22 MED ORDER — FUROSEMIDE 20 MG PO TABS
20.0000 mg | ORAL_TABLET | Freq: Every day | ORAL | Status: DC
  Administered 2019-11-23 – 2019-11-30 (×6): 20 mg via ORAL
  Filled 2019-11-22 (×9): qty 1

## 2019-11-22 MED ORDER — ALUM & MAG HYDROXIDE-SIMETH 200-200-20 MG/5ML PO SUSP
15.0000 mL | ORAL | Status: DC | PRN
  Administered 2019-11-24: 30 mL via ORAL
  Filled 2019-11-22: qty 30

## 2019-11-22 MED ORDER — ORAL CARE MOUTH RINSE: 15.0000 mL | Freq: Once | OROMUCOSAL | Status: AC

## 2019-11-22 MED ORDER — ROCURONIUM BROMIDE 10 MG/ML (PF) SYRINGE
PREFILLED_SYRINGE | INTRAVENOUS | Status: AC
  Filled 2019-11-22: qty 10

## 2019-11-22 MED ORDER — LIDOCAINE HCL (PF) 2 % IJ SOLN
INTRAMUSCULAR | Status: AC
  Filled 2019-11-22: qty 5

## 2019-11-22 MED ORDER — PHENOL 1.4 % MT LIQD
1.0000 | OROMUCOSAL | Status: DC | PRN
  Filled 2019-11-22: qty 177

## 2019-11-22 MED ORDER — SODIUM CHLORIDE 0.9 % IV SOLN
INTRAVENOUS | Status: DC | PRN
  Administered 2019-11-22: 50 ug/min via INTRAVENOUS

## 2019-11-22 MED ORDER — PANTOPRAZOLE SODIUM 40 MG PO TBEC
40.0000 mg | DELAYED_RELEASE_TABLET | Freq: Every day | ORAL | Status: DC
Start: 2019-11-22 — End: 2019-11-22

## 2019-11-22 MED ORDER — KETAMINE HCL 50 MG/ML IJ SOLN
INTRAMUSCULAR | Status: AC
  Filled 2019-11-22: qty 10

## 2019-11-22 MED ORDER — RESTORE WOUND CARE DRESSING EX PADS
1.0000 | MEDICATED_PAD | Freq: Every day | CUTANEOUS | Status: DC
  Administered 2019-11-25: 1 via TOPICAL
  Filled 2019-11-22: qty 1

## 2019-11-22 MED ORDER — IPRATROPIUM-ALBUTEROL 0.5-2.5 (3) MG/3ML IN SOLN
3.0000 mL | RESPIRATORY_TRACT | Status: DC
  Administered 2019-11-22: 3 mL via RESPIRATORY_TRACT

## 2019-11-22 MED ORDER — PROPOFOL 10 MG/ML IV BOLUS
INTRAVENOUS | Status: DC | PRN
  Administered 2019-11-22: 50 mg via INTRAVENOUS
  Administered 2019-11-22: 40 mg via INTRAVENOUS
  Administered 2019-11-22: 100 mg via INTRAVENOUS

## 2019-11-22 MED ORDER — ONDANSETRON HCL 4 MG/2ML IJ SOLN: 4.0000 mg | Freq: Four times a day (QID) | INTRAMUSCULAR | Status: DC | PRN

## 2019-11-22 MED ORDER — DEXAMETHASONE SODIUM PHOSPHATE 10 MG/ML IJ SOLN
INTRAMUSCULAR | Status: AC
  Filled 2019-11-22: qty 1

## 2019-11-22 MED ORDER — IPRATROPIUM-ALBUTEROL 0.5-2.5 (3) MG/3ML IN SOLN
3.0000 mL | RESPIRATORY_TRACT | Status: DC
  Administered 2019-11-22 – 2019-11-25 (×13): 3 mL via RESPIRATORY_TRACT
  Filled 2019-11-22 (×15): qty 3

## 2019-11-22 MED ORDER — OXYCODONE HCL 5 MG PO TABS
5.0000 mg | ORAL_TABLET | ORAL | Status: DC | PRN
  Administered 2019-11-22: 5 mg via ORAL
  Administered 2019-11-23 (×2): 10 mg via ORAL
  Administered 2019-11-23: 5 mg via ORAL
  Administered 2019-11-24 – 2019-11-30 (×30): 10 mg via ORAL
  Filled 2019-11-22 (×25): qty 2
  Filled 2019-11-22: qty 1
  Filled 2019-11-22 (×3): qty 2
  Filled 2019-11-22: qty 1
  Filled 2019-11-22 (×4): qty 2

## 2019-11-22 MED ORDER — NITROGLYCERIN 0.4 MG SL SUBL: 0.4000 mg | SUBLINGUAL_TABLET | SUBLINGUAL | Status: DC | PRN

## 2019-11-22 MED ORDER — POTASSIUM CHLORIDE CRYS ER 20 MEQ PO TBCR
20.0000 meq | EXTENDED_RELEASE_TABLET | Freq: Once | ORAL | Status: AC
  Administered 2019-11-22: 20 meq via ORAL
  Filled 2019-11-22: qty 1

## 2019-11-22 MED ORDER — BUPIVACAINE LIPOSOME 1.3 % IJ SUSP
INTRAMUSCULAR | Status: DC | PRN
  Administered 2019-11-22: 50 mL

## 2019-11-22 MED ORDER — METOPROLOL TARTRATE 5 MG/5ML IV SOLN: 2.0000 mg | INTRAVENOUS | Status: DC | PRN

## 2019-11-22 MED ORDER — IPRATROPIUM BROMIDE 0.03 % NA SOLN
2.0000 | Freq: Two times a day (BID) | NASAL | Status: DC
  Administered 2019-11-22 – 2019-11-30 (×14): 2 via NASAL
  Filled 2019-11-22: qty 30

## 2019-11-22 MED ORDER — ONDANSETRON HCL 4 MG/2ML IJ SOLN
INTRAMUSCULAR | Status: AC
  Filled 2019-11-22: qty 2

## 2019-11-22 MED ORDER — LACTATED RINGERS IV SOLN: INTRAVENOUS | Status: DC | PRN

## 2019-11-22 MED ORDER — HYDRALAZINE HCL 20 MG/ML IJ SOLN: 5.0000 mg | INTRAMUSCULAR | Status: DC | PRN

## 2019-11-22 MED ORDER — ALBUTEROL SULFATE (2.5 MG/3ML) 0.083% IN NEBU: 2.5000 mg | INHALATION_SOLUTION | RESPIRATORY_TRACT | Status: DC | PRN

## 2019-11-22 MED ORDER — VITAMIN B-12 1000 MCG PO TABS
1000.0000 ug | ORAL_TABLET | Freq: Every day | ORAL | Status: DC
  Administered 2019-11-22 – 2019-11-30 (×9): 1000 ug via ORAL
  Filled 2019-11-22 (×9): qty 1

## 2019-11-22 MED ORDER — LIDOCAINE HCL (CARDIAC) PF 100 MG/5ML IV SOSY
PREFILLED_SYRINGE | INTRAVENOUS | Status: DC | PRN
  Administered 2019-11-22: 80 mg via INTRAVENOUS
  Administered 2019-11-22: 20 mg via INTRAVENOUS

## 2019-11-22 MED ORDER — FENTANYL CITRATE (PF) 100 MCG/2ML IJ SOLN
INTRAMUSCULAR | Status: DC | PRN
  Administered 2019-11-22 (×2): 50 ug via INTRAVENOUS

## 2019-11-22 MED ORDER — PANTOPRAZOLE SODIUM 40 MG PO TBEC
40.0000 mg | DELAYED_RELEASE_TABLET | Freq: Every day | ORAL | Status: DC
  Administered 2019-11-23 – 2019-11-30 (×8): 40 mg via ORAL
  Filled 2019-11-22 (×8): qty 1

## 2019-11-22 MED ORDER — SODIUM CHLORIDE 0.9 % IV SOLN: INTRAVENOUS | Status: DC

## 2019-11-22 MED ORDER — WARFARIN - PHYSICIAN DOSING INPATIENT: Freq: Every day | Status: DC

## 2019-11-22 SURGICAL SUPPLY — 44 items
BLADE SAGITTAL WIDE XTHICK NO (BLADE) IMPLANT
BLADE SAW SAG 25.4X90 (BLADE) ×3 IMPLANT
BNDG COHESIVE 4X5 TAN STRL (GAUZE/BANDAGES/DRESSINGS) ×3 IMPLANT
BNDG ELASTIC 6X5.8 VLCR NS LF (GAUZE/BANDAGES/DRESSINGS) ×3 IMPLANT
BNDG ELASTIC 6X5.8 VLCR STR LF (GAUZE/BANDAGES/DRESSINGS) ×2 IMPLANT
BNDG GAUZE 4.5X4.1 6PLY STRL (MISCELLANEOUS) ×6 IMPLANT
BNDG STRETCH 4X75 STRL LF (GAUZE/BANDAGES/DRESSINGS) ×2 IMPLANT
BRUSH SCRUB EZ  4% CHG (MISCELLANEOUS) ×2
BRUSH SCRUB EZ 4% CHG (MISCELLANEOUS) ×1 IMPLANT
CANISTER SUCT 1200ML W/VALVE (MISCELLANEOUS) ×3 IMPLANT
CHLORAPREP W/TINT 26ML (MISCELLANEOUS) ×3 IMPLANT
COVER WAND RF STERILE (DRAPES) ×3 IMPLANT
DRAIN PENROSE 1/4X12 LTX STRL (WOUND CARE) IMPLANT
DRAPE INCISE IOBAN 66X45 STRL (DRAPES) IMPLANT
ELECT CAUTERY BLADE 6.4 (BLADE) ×3 IMPLANT
ELECT REM PT RETURN 9FT ADLT (ELECTROSURGICAL) ×3
ELECTRODE REM PT RTRN 9FT ADLT (ELECTROSURGICAL) ×1 IMPLANT
GAUZE XEROFORM 1X8 LF (GAUZE/BANDAGES/DRESSINGS) ×4 IMPLANT
GLOVE BIO SURGEON STRL SZ7 (GLOVE) ×6 IMPLANT
GLOVE INDICATOR 7.5 STRL GRN (GLOVE) ×3 IMPLANT
GOWN STRL REUS W/ TWL LRG LVL3 (GOWN DISPOSABLE) ×2 IMPLANT
GOWN STRL REUS W/ TWL XL LVL3 (GOWN DISPOSABLE) ×1 IMPLANT
GOWN STRL REUS W/TWL LRG LVL3 (GOWN DISPOSABLE) ×4
GOWN STRL REUS W/TWL XL LVL3 (GOWN DISPOSABLE) ×2
HANDLE YANKAUER SUCT BULB TIP (MISCELLANEOUS) ×3 IMPLANT
KIT TURNOVER KIT A (KITS) ×3 IMPLANT
LABEL OR SOLS (LABEL) ×3 IMPLANT
NS IRRIG 1000ML POUR BTL (IV SOLUTION) ×3 IMPLANT
PACK EXTREMITY (MISCELLANEOUS) ×3 IMPLANT
PAD ABD DERMACEA PRESS 5X9 (GAUZE/BANDAGES/DRESSINGS) ×4 IMPLANT
PAD PREP 24X41 OB/GYN DISP (PERSONAL CARE ITEMS) ×3 IMPLANT
SPONGE LAP 18X18 RF (DISPOSABLE) ×9 IMPLANT
STAPLER SKIN PROX 35W (STAPLE) ×3 IMPLANT
STOCKINETTE M/LG 89821 (MISCELLANEOUS) ×3 IMPLANT
SUT ETHILON 3-0 FS-10 30 BLK (SUTURE) ×3
SUT SILK 2 0 (SUTURE) ×2
SUT SILK 2 0 SH (SUTURE) ×6 IMPLANT
SUT SILK 2-0 18XBRD TIE 12 (SUTURE) ×1 IMPLANT
SUT SILK 3 0 (SUTURE) ×2
SUT SILK 3-0 18XBRD TIE 12 (SUTURE) ×1 IMPLANT
SUT VIC AB 0 CT1 36 (SUTURE) ×8 IMPLANT
SUT VIC AB 2-0 CT1 (SUTURE) ×6 IMPLANT
SUTURE EHLN 3-0 FS-10 30 BLK (SUTURE) IMPLANT
SYR 20ML LL LF (SYRINGE) ×2 IMPLANT

## 2019-11-22 NOTE — Op Note (Signed)
OPERATIVE NOTE   PROCEDURE: Left below-the-knee amputation  PRE-OPERATIVE DIAGNOSIS: Left foot gangrene  POST-OPERATIVE DIAGNOSIS: same as above  SURGEON: Leotis Pain, MD  ASSISTANT(S): Hezzie Bump, PA-C  ANESTHESIA: general  ESTIMATED BLOOD LOSS: 150 cc  FINDING(S): none  SPECIMEN(S):  Left below-the-knee amputation  INDICATIONS:   Tyler [name] is a 64 y.o. male who presents with left leg gangrene.  The patient is scheduled for a left below-the-knee amputation.  I discussed in depth with the patient the risks, benefits, and alternatives to this procedure.  The patient is aware that the risk of this operation included but are not limited to:  bleeding, infection, myocardial infarction, stroke, death, failure to heal amputation wound, and possible need for more proximal amputation.  The patient is aware of the risks and agrees proceed forward with the procedure. An assistant was present during the procedure to help facilitate the exposure and expedite the procedure.  DESCRIPTION:  After full informed written consent was obtained from the patient, the patient was brought back to the operating room, and placed supine upon the operating table.  Prior to induction, the patient received IV antibiotics.  The patient was then prepped and draped in the standard fashion for a below-the-knee amputation. The assistant provided retraction and mobilization to help facilitate exposure and expedite the procedure throughout the entire procedure.  This included following suture, using retractors, and optimizing lighting. After obtaining adequate anesthesia, the patient was prepped and draped in the standard fashion for a left below-the-knee amputation.  I marked out the anterior incision two finger breadths below the tibial tuberosity and then the marked out a posterior flap that was one third of the circumference of the calf in length.   I made the incisions for these flaps, and then dissected  through the subcutaneous tissue, fascia, and muscle anteriorly.  I elevated  the periosteal tissue superiorly so that the tibia was about 3-4 cm shorter than the anterior skin flap.  I then transected the tibia with a power saw and then took a wedge off the tibia anteriorly with the power saw.  Then I smoothed out the rough edges.  In a similar fashion, I cut back the fibula about two centimeters higher than the level of the tibia with a bone cutter.  I put a bone hook into the distal tibia and then used a large amputation knife to sharply develop a tissue plane through the muscle along the fibula.  In such fashion, the posterior flap was developed.  At this point, the specimen was passed off the field as the below-the-knee amputation.  At this point, I clamped all visibly bleeding arteries and veins using a combination of suture ligation with Silk suture and electrocautery.  Bleeding continued to be controlled with electrocautery and suture ligature.  The stump was washed off with sterile normal saline and no further active bleeding was noted.  I reapproximated the anterior and posterior fascia  with interrupted stitches of 0 Vicryl.  This was completed along the entire length of anterior and posterior fascia until there were no more loose space in the fascial line. I then placed a layer of 2-0 Vicryl sutures in the subcutaneous tissue. The skin was then  reapproximated with staples.  The stump was washed off and dried.  The incision was dressed with Xeroform and  then fluffs were applied.  Kerlix was wrapped around the leg and then gently an ACE wrap was applied.    COMPLICATIONS: none  CONDITION:  stable   Leotis Pain  11/22/2019, 12:58 PM    This note was created with Dragon Medical transcription system. Any errors in dictation are purely unintentional.

## 2019-11-22 NOTE — Anesthesia Procedure Notes (Addendum)
Procedure Name: LMA Insertion Date/Time: 11/22/2019 11:50 AM Performed by: Lia Foyer, CRNA Pre-anesthesia Checklist: Patient identified, Emergency Drugs available, Suction available and Patient being monitored Patient Re-evaluated:Patient Re-evaluated prior to induction Oxygen Delivery Method: Circle system utilized Preoxygenation: Pre-oxygenation with 100% oxygen Induction Type: IV induction Ventilation: Mask ventilation without difficulty LMA: LMA inserted LMA Size: 4.5 Tube type: Oral Number of attempts: 1 Airway Equipment and Method: Oral airway and Patient positioned with wedge pillow Placement Confirmation: ETT inserted through vocal cords under direct vision,  positive ETCO2 and breath sounds checked- equal and bilateral Tube secured with: Tape Dental Injury: Teeth and Oropharynx as per pre-operative assessment

## 2019-11-22 NOTE — H&P (Signed)
[facility] History & Physical Update  The patient was interviewed and re-examined.  The patient's previous History and Physical has been reviewed and is unchanged.  There is no change in the plan of care. We plan to proceed with the scheduled procedure.  Leotis Pain, MD  11/22/2019, 10:55 AM

## 2019-11-22 NOTE — Transfer of Care (Signed)
Immediate Anesthesia Transfer of Care Note  Patient: Tyler [name]  Procedure(s) Performed: AMPUTATION BELOW KNEE (Left Knee)  Patient Location: PACU  Anesthesia Type:General  Level of Consciousness: drowsy  Airway & Oxygen Therapy: Patient Spontanous Breathing and Patient connected to face mask oxygen  Post-op Assessment: Report given to RN and Post -op Vital signs reviewed and stable  Post vital signs: Reviewed and stable  Last Vitals:  Vitals Value Taken Time  BP 91/61 11/22/19 1311  Temp    Pulse 75 11/22/19 1313  Resp 13 11/22/19 1313  SpO2 100 % 11/22/19 1313  Vitals shown include unvalidated device data.  Last Pain:  Vitals:   11/22/19 1313  TempSrc:   PainSc: (P) Asleep      Patients Stated Pain Goal: 0 (03/35/33 1740)  Complications: No complications documented.

## 2019-11-22 NOTE — Anesthesia Preprocedure Evaluation (Addendum)
Anesthesia Evaluation  Patient identified by MRN, date of birth, ID band Patient awake    Reviewed: Allergy & Precautions, H&P , NPO status , Patient's Chart, lab work & pertinent test results  Airway Mallampati: I  TM Distance: >3 FB Neck ROM: limited    Dental  (+) Missing, Chipped   Pulmonary shortness of breath, with exertion, at rest, lying and Long-Term Oxygen Therapy, COPD,  COPD inhaler and oxygen dependent, Patient abstained from smoking., former smoker,  Wears 2-3L O2 ATC SOB with minimal exertion    + decreased breath sounds      Cardiovascular hypertension, (-) angina+ CAD, + Past MI, + Cardiac Stents, + Peripheral Vascular Disease, +CHF, + Orthopnea and + DOE  + dysrhythmias Atrial Fibrillation + pacemaker + Cardiac Defibrillator  Rhythm:regular Rate:Normal  Echo 10/25/19: IMPRESSIONS    1. Left ventricular ejection fraction, by estimation, is 55 to 60%. Left  ventricular ejection fraction by PLAX is 62 %. The left ventricle has  normal function. The left ventricle demonstrates regional wall motion  abnormalities (see scoring  diagram/findings for description). Left ventricular diastolic parameters  were normal.  2. Right ventricular systolic function is normal. The right ventricular  size is normal.  3. Left atrial size was moderately dilated.  4. The mitral valve was not well visualized. Mild to moderate mitral  valve regurgitation.  5. The aortic valve was not well visualized. Aortic valve regurgitation  is trivial.    Neuro/Psych PSYCHIATRIC DISORDERS Anxiety Depression Cervical spinal stenosis    GI/Hepatic Neg liver ROS, GERD  ,  Endo/Other  negative endocrine ROS  Renal/GU negative Renal ROS  negative genitourinary   Musculoskeletal   Abdominal   Peds  Hematology negative hematology ROS (+)   Anesthesia Other Findings Past Medical History: No date: AICD (automatic  cardioverter/defibrillator) present No date: Anxiety No date: Arthritis 09/2019: Atherosclerosis of artery of extremity with ulceration (HCC)     Comment:  left foot s/p toe amp requiring debridement and futher               toe amputations. No date: Atrial fibrillation (HCC) No date: Cervical spinal stenosis     Comment:  with neuropathy No date: CHF (congestive heart failure) (HCC) No date: Constipation No date: COPD (chronic obstructive pulmonary disease) (HCC) No date: Coronary artery disease No date: Depression No date: Dyspnea No date: Dysrhythmia     Comment:  atrial fibrillation No date: Emphysema of lung (HCC) No date: GERD (gastroesophageal reflux disease) No date: Hypertension No date: Lung nodule seen on imaging study     Comment:  being followed by dr. [name]. just watching it for last               few years, without change 2004: Myocardial infarction [facility])     Comment:  stent placed, pacemaker implanted 2005 No date: Oxygen dependent     Comment:  requires 2L nasal prong oxygen 24 hours a day No date: Peripheral vascular disease ([place]) 9924,2683: Presence of permanent cardiac pacemaker  Past Surgical History: No date: ABOVE KNEE LEG AMPUTATION; Right     Comment:  after below the knee amputation  08/04/2019: AMPUTATION TOE; Left     Comment:  Procedure: AMPUTATION TOE MPJ LEFT;  Surgeon: Samara Deist, DPM;  Location: [facility] ORS;  Service: Podiatry;                Laterality:  Left; 10/06/2019: AMPUTATION TOE; Left     Comment:  Procedure: AMPUTATION TOE MPJ T1,T2 LEFT;  Surgeon:               Samara Deist, DPM;  Location: [facility];  Service:               Podiatry;  Laterality: Left; 5/57/3220: APPLICATION OF WOUND VAC; Left     Comment:  Procedure: APPLICATION OF WOUND VAC;  Surgeon: Algernon Huxley, MD;  Location: [facility];  Service: Vascular;                Laterality: Left; No date: BELOW KNEE LEG AMPUTATION; Right No date:  CATARACT EXTRACTION, BILATERAL; Bilateral 08/11/2017: ENDARTERECTOMY FEMORAL; Left     Comment:  Procedure: ENDARTERECTOMY FEMORAL;  Surgeon: Algernon Huxley, MD;  Location: [facility] OR[facility];  Service: Vascular;                Laterality: Left; 01/12/2018: HEMATOMA EVACUATION; Left     Comment:  Procedure: EVACUATION HEMATOMA ( DRAINING OF SEROMA);                Surgeon: Algernon Huxley, MD;  Location: [facility];  Service:              Vascular;  Laterality: Left; 02/10/2017: IMPLANTABLE CARDIOVERTER DEFIBRILLATOR (ICD) GENERATOR  CHANGE; Left     Comment:  Procedure: ICD GENERATOR CHANGE;  Surgeon: Isaias Cowman, MD;  Location: [facility];  Service:               Cardiovascular;  Laterality: Left; 2542,7062: INSERT / REPLACE / REMOVE PACEMAKER 06/07/2017: LOWER EXTREMITY ANGIOGRAPHY; Left     Comment:  Procedure: LOWER EXTREMITY ANGIOGRAPHY;  Surgeon: Algernon Huxley, MD;  Location: [place] [facility];  Service:               Cardiovascular;  Laterality: Left; 10/05/2019: LOWER EXTREMITY ANGIOGRAPHY; Left     Comment:  Procedure: LOWER EXTREMITY ANGIOGRAPHY;  Surgeon: Algernon Huxley, MD;  Location: [place] [facility];  Service:               Cardiovascular;  Laterality: Left; 06/07/2017: LOWER EXTREMITY INTERVENTION     Comment:  Procedure: LOWER EXTREMITY INTERVENTION;  Surgeon: Algernon Huxley, MD;  Location: [place] [facility];  Service:               Cardiovascular;; 12/09/2015: PERIPHERAL VASCULAR CATHETERIZATION; Left     Comment:  Procedure: Lower Extremity Angiography;  Surgeon: Algernon Huxley, MD;  Location: [place] CV LAB;  Service:               Cardiovascular;  Laterality: Left; 12/09/2015: PERIPHERAL VASCULAR CATHETERIZATION     Comment:  Procedure: Lower Extremity Intervention;  Surgeon: Algernon Huxley, MD;  Location: [place] CV LAB;  Service:               Cardiovascular;; No date:  TONSILLECTOMY  BMI    Body Mass Index: 21.77 kg/m      Reproductive/Obstetrics negative OB ROS                           Anesthesia Physical  Anesthesia Plan  ASA: III  Anesthesia Plan: General LMA   Post-op Pain Management:    Induction:   PONV Risk Score and Plan:   Airway Management Planned:   Additional Equipment:   Intra-op Plan:   Post-operative Plan:   Informed Consent: I have reviewed the patients History and Physical, chart, labs and discussed the procedure including the risks, benefits and alternatives for the proposed anesthesia with the patient or authorized representative who has indicated his/her understanding and acceptance.     Dental Advisory Given  Plan Discussed with: Anesthesiologist, CRNA and Surgeon  Anesthesia Plan Comments:       Anesthesia Quick Evaluation

## 2019-11-23 ENCOUNTER — Encounter: Payer: Self-pay | Admitting: Vascular Surgery

## 2019-11-23 DIAGNOSIS — R0902 Hypoxemia: Secondary | ICD-10-CM

## 2019-11-23 MED ORDER — SODIUM CHLORIDE 1 G PO TABS
2.0000 g | ORAL_TABLET | Freq: Two times a day (BID) | ORAL | Status: AC
  Administered 2019-11-23 (×2): 2 g via ORAL
  Filled 2019-11-23 (×3): qty 2

## 2019-11-23 MED ORDER — TIOTROPIUM BROMIDE MONOHYDRATE 18 MCG IN CAPS
1.0000 | ORAL_CAPSULE | Freq: Every day | RESPIRATORY_TRACT | Status: DC
  Administered 2019-11-24 – 2019-11-28 (×3): 18 ug via RESPIRATORY_TRACT
  Filled 2019-11-23: qty 5

## 2019-11-23 NOTE — Anesthesia Postprocedure Evaluation (Signed)
Anesthesia Post Note  Patient: Tyler [name]  Procedure(s) Performed: AMPUTATION BELOW KNEE (Left Knee)  Patient location during evaluation: PACU Anesthesia Type: General Level of consciousness: awake and alert Pain management: pain level controlled Vital Signs Assessment: post-procedure vital signs reviewed and stable Respiratory status: spontaneous breathing, nonlabored ventilation and respiratory function stable Cardiovascular status: blood pressure returned to baseline and stable Postop Assessment: no apparent nausea or vomiting Anesthetic complications: no   No complications documented.   Last Vitals:  Vitals:   11/23/19 0755 11/23/19 0848  BP:  (!) 137/95  Pulse:  87  Resp:    Temp:    SpO2: 99%     Last Pain:  Vitals:   11/23/19 0745  TempSrc:   PainSc: [place]

## 2019-11-23 NOTE — TOC Initial Note (Signed)
Transition of Care [facility]) - Initial/Assessment Note    Patient Details  Name: Tyler [name] MRN: [id number] Date of Birth: 1955-11-27  [facility] CM/SW Contact:    Candie Chroman, LCSW Phone Number: 11/23/2019, 2:40 PM  Clinical Narrative: Readmission prevention screen complete. Patient's PCP is Harrel Lemon, MD at [facility]. His wife drives him to appts. [facility] [place] No issues obtaining medications other than his inhalers being expensive. He has looked up coupons on Good[employer] but they are the same price. Patient was getting home health PT and RN through [facility] until 6/25 when he was discharged. Patient said they told him that he did not want the services but that was not true. He would like to restart services. CSW left voicemail for [employer]. Patient has a transport chair, wheelchair, slide board, and a hospital bed at home. He is agreeable to PT recommendation for a bedside commode with drop arms. [employer] has confirmed they have these. Sent referral information to [place] representative. Per patient, he will likely discharge Sunday or Monday. Patient is on 2-2.5 L oxygen at home through [facility]. No further concerns. CSW encouraged patient and his wife to contact CSW as needed. CSW will continue to follow patient and his wife for support and facilitate return home when stable.                 Expected Discharge Plan: [place] Barriers to Discharge: Continued Medical Work up   Patient Goals and CMS Choice     Choice offered to / list presented to : NA  Expected Discharge Plan and Services Expected Discharge Plan: [place] Acute Care Choice: Home Health, Durable Medical Equipment Living arrangements for the past 2 months: [place]                 DME Arranged: 3-N-1 (with drop arms) DME Agency: [employer] Date DME Agency Contacted: 11/23/19   Representative spoke with  at DME Agency: Andree Coss HH Arranged: RN, PT, OT [facility] Agency: [facility] Date [place]: 11/23/19   Representative spoke with at [place]: Jana Half  Prior Living Arrangements/Services Living arrangements for the past 2 months: [place] Lives with:: Spouse Patient language and need for interpreter reviewed:: Yes Do you feel safe going back to the place where you live?: Yes      Need for Family Participation in Patient Care: Yes (Comment) Care giver support system in place?: Yes (comment) Current home services: DME Criminal Activity/Legal Involvement Pertinent to Current Situation/Hospitalization: No - Comment as needed  Activities of Daily Living Home Assistive Devices/Equipment: Blood pressure cuff, Wheelchair, Oxygen, Dentures (specify type) ADL Screening (condition at time of admission) Patient's cognitive ability adequate to safely complete daily activities?: Yes Is the patient deaf or have difficulty hearing?: No Does the patient have difficulty seeing, even when wearing glasses/contacts?: No Does the patient have difficulty concentrating, remembering, or making decisions?: No Patient able to express need for assistance with ADLs?: Yes Does the patient have difficulty dressing or bathing?: No Independently performs ADLs?: No Communication: Independent Dressing (OT): Needs assistance Is this a change from baseline?: Pre-admission baseline Grooming: Needs assistance Is this a change from baseline?: Pre-admission baseline Bathing: Needs assistance Is this a change from baseline?: Pre-admission baseline Toileting: Needs assistance Is this a change from baseline?: Pre-admission baseline In/Out Bed: Needs assistance Is this a change  from baseline?: Pre-admission baseline Walks in Home: Needs assistance Is this a change from baseline?: Pre-admission baseline Does the patient have difficulty walking or climbing stairs?: Yes Weakness of Legs:  Both Weakness of Arms/Hands: None  Permission Sought/Granted Permission sought to share information with : Facility Sport and exercise psychologist, Family Supports Permission granted to share information with : Yes, Verbal Permission Granted  Share Information with NAME: Irby Fails  Permission granted to share info w AGENCY: [facility]  Permission granted to share info w Relationship: Wife  Permission granted to share info w Contact Information: [phone]  Emotional Assessment Appearance:: Appears stated age Attitude/Demeanor/Rapport: Engaged, Gracious Affect (typically observed): Accepting, Appropriate, Calm, Pleasant Orientation: : Oriented to Self, Oriented to Place, Oriented to  Time, Oriented to Situation Alcohol / Substance Use: Not Applicable Psych Involvement: No (comment)  Admission diagnosis:  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 BPH (benign prostatic hyperplasia) 10/25/2019  . Depression with anxiety 10/25/2019  . Osteomyelitis of left leg ([place]) 10/18/2019  . Leg swelling 03/26/2019  . Palliative care by specialist 02/16/2019  . Atherosclerosis of native arteries of the extremities with ulceration ([place]) 01/31/2019  . Postoperative seroma of subcutaneous tissue after non-dermatologic procedure 02/04/2018  . Current mild episode of major depressive disorder without prior episode ([place]) 12/14/2017  . Atherosclerosis of artery of extremity with rest pain ([place]) 08/11/2017  . COPD (chronic obstructive pulmonary disease) ([place]) 06/22/2017  . Cervical disc disease with myelopathy 05/28/2017  . COPD with acute exacerbation ([place]) 10/13/2016  . Acute on chronic respiratory failure with hypoxia ([place]) 10/13/2016  . Acute on chronic systolic (congestive) heart failure ([place]) 10/13/2016  . AF (paroxysmal atrial fibrillation) ([place])  10/13/2016  . Hx of AKA (above knee amputation), right ([place]) 05/15/2016  . Essential hypertension, benign 05/15/2016  . PVD (peripheral vascular disease) ([place]) 05/15/2016  . Atherosclerotic peripheral vascular disease with intermittent claudication ([place]) 08/01/2014  . Moderate tricuspid insufficiency 08/01/2014  . Mixed hyperlipidemia 07/23/2014  . AICD (automatic cardioverter/defibrillator) present 06/12/2011  . Factor V Leiden mutation ([place]) 06/12/2011  . Coronary artery disease 06/12/2011   PCP:  [name], MD Pharmacy[facility]   [id number] [place], [address] [address] Phone: [phone] Fax: [phone]     Social Determinants of Health (SDOH) Interventions    Readmission Risk Interventions Readmission Risk Prevention Plan 11/23/2019  Transportation Screening Complete  PCP or Specialist Appt within 3-5 Days Complete  HRI or [place] Complete  Social Work Consult for [place] Planning/Counseling Complete  Palliative Care Screening Complete  Medication Review [employer]) Complete  Some recent data might be hidden

## 2019-11-23 NOTE — Evaluation (Signed)
Occupational Therapy Evaluation Patient Details Name: Tyler [name] MRN: [id number] DOB: 26-Jan-1956 Today's Date: 11/23/2019    History of Present Illness 64 y.o. male s/p recent transmet amputation, ultimately unsalvagable and had L BKA 6/30 (20+ year history of R AKA).  Medical history significant of sCHF with EF of 30%, HTN, COPD on 2 L oxygen, BPH, GERD, AICD placement, CAD, PVD, A-fib, factor V Leyden mutation, depression with anxiety, GERD.   Clinical Impression   Tyler [name] was seen for OT evaluation this date, POD#1 from above surgery. Pt received semi-supine in bed, with spouse present at bedside, agreeable to OT evaluation. Pt and caregiver report that prior to this admission, pt required assistance with BADL management including assist from his spouse for bathing and pushing his transport wheelchair which he has used for functional mobility since 2018. Pt has 20+ year hx of R AKA and is good spirits, but reports some difficulty now adjusting to life with bilateral lower limb amputations. Pt currently requires max assist for mobility and LB dressing while in seated position due to pain and limited balance. Pt instructed in bed mobility, desensitization strategies for management of residual limb, falls prevention strategies, home/routines modifications, DME/AE for LB bathing and dressing tasks, and positional strategies to maximize hip/knee extension and functional use. Pt would benefit from skilled OT services including additional instruction in dressing techniques with or without assistive devices for dressing and bathing skills to support recall and carryover prior to discharge and ultimately to maximize safety, independence, and minimize falls risk and caregiver burden. Recommend HHOT upon hospital DC to maximize pt safety and return to PLOF during meaningful occupations of daily life.    Follow Up Recommendations  Home health OT    Equipment Recommendations  3 in 1 bedside commode  (drop arm commode)    Recommendations for Other Services       Precautions / Restrictions Precautions Precautions: Fall Restrictions Weight Bearing Restrictions: Yes (Simultaneous filing. User may not have seen previous data.) RLE Weight Bearing:  (Old AKA) LLE Weight Bearing: Non weight bearing (s/p BKA) Other Position/Activity Restrictions: Pt with hx of R AKA      Mobility Bed Mobility Overal bed mobility: Modified Independent             General bed mobility comments: Pt limited by pain but remains mod I for bed mobility.  Transfers Overall transfer level: Modified independent Equipment used: None             General transfer comment: deferred for pt comfort. Per PT note earlier this date, pt able to use BUE to side scoot from bed to recliner.    Balance Overall balance assessment: Needs assistance Sitting-balance support: Bilateral upper extremity supported;Single extremity supported Sitting balance-Leahy Scale: Good Sitting balance - Comments: Steady sitting, reaching within BOS.     Standing balance-Leahy Scale: Zero                             ADL either performed or assessed with clinical judgement   ADL Overall ADL's : Needs assistance/impaired                                     Functional mobility during ADLs:  (slide board/Anterior-posterior t/f) General ADL Comments: Pt functionally limited by increased pain and decreased functional AROM of his LLE. He requires increased assistance with  ADL management at baseline 2/2 old R AKA, however currently more limited in terms of balance, functional reach, and decreased activity tolerance. [place] MAX A for LB ADL management including bathing and toileting. Set-up/supervision for seated UB ADL management.     Vision Baseline Vision/History: No visual deficits Patient Visual Report: No change from baseline       Perception     Praxis      Pertinent Vitals/Pain Pain  Assessment: 0-10 Pain Score: 6  Pain Location: modest R stump pain more from swelling/pressure  Pain Descriptors / Indicators: Sore;Constant;Tightness Pain Intervention(s): Limited activity within patient's tolerance;Monitored during session;Premedicated before session;Utilized relaxation techniques;Repositioned     Hand Dominance Right   Extremity/Trunk Assessment Upper Extremity Assessment Upper Extremity Assessment: Overall WFL for tasks assessed (Pt 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 Endorses decreased sensation with LUE>RUE. Overall WFL for AROM, strength, and FMC.)   Lower Extremity Assessment Lower Extremity Assessment: LLE deficits/detail;RLE deficits/detail RLE Deficits / Details: Old AKA LLE Deficits / Details: s/p new BKA LLE: Unable to fully assess due to pain       Communication Communication Communication: No difficulties   Cognition Arousal/Alertness: Awake/alert Behavior During Therapy: WFL for tasks assessed/performed Overall Cognitive Status: Within Functional Limits for tasks assessed                                 General Comments: Pt pleasant, conversational, eager to participate in therapy session.   General Comments  Pt on 2L [place] t/o session. Vitals remain WFLs.    Exercises Exercises: General Lower Extremity General Exercises - Lower Extremity Quad Sets: Strengthening;10 reps;Right Short Arc Quad: AROM;10 reps;Right Hip ABduction/ADduction: Strengthening;10 reps;Right Other Exercises Other Exercises: Pt and caregiver (spouse present at bedside) educated on role of OT in acute setting vs. inpatient rehab vs. home health, falls prevention strategies, safe use of AE/DME for ADL management, and residual limb management strategies including gentle circular massage, gentle tapping, and positional strategies to maximize knee/hip extension and functional use.   Shoulder Instructions      Home Living Family/patient  expects to be discharged to:: Private residence Living Arrangements: Spouse/significant other Available Help at Discharge: Family;Available 24 hours/day Type of Home: Mobile home Home Access: Ramped entrance     Home Layout: One level     Bathroom Shower/Tub: Walk-in shower;Door   Bathroom Toilet: Handicapped height Bathroom Accessibility: Yes How Accessible: Accessible via wheelchair;Accessible via walker Home Equipment: Bedside commode;Wheelchair - Banker          Prior Functioning/Environment Level of Independence: Needs assistance  Gait / Transfers Assistance Needed: Pt reports primarily using crutches for fxl mobility prior to 2018, states that d/t spinal stenosis and decreased sensation in hands, that he has used transport chair since that time and spouse pushes him. While pt has manual w/c and can use UEs to propel it, it is from [place] and bulky which prevents it from fitting in hallways of his home.  In addition, it is too heavy for his spouse to load in/out of vehicle. ADL's / Homemaking Assistance Needed: Pt reports being able to do all UB ADLs independently, needs some assist for LB ADLs at baseline. Pt's spouse assists with bathing, pushes transport chair, drives, gets groceries and performes household IADLs.            OT Problem List: Pain;Decreased activity tolerance;Decreased safety awareness;Impaired balance (sitting and/or standing);Decreased knowledge of use  of DME or AE;Decreased knowledge of precautions;Decreased coordination      OT Treatment/Interventions: Self-care/ADL training;Therapeutic exercise;Therapeutic activities;DME and/or AE instruction;Patient/family education;Balance training    OT Goals(Current goals can be found in the care plan section) Acute Rehab OT Goals Patient Stated Goal: go home and eventually work on walking with prosthetics OT Goal Formulation: With patient Time For Goal Achievement: 12/07/19 Potential to Achieve  Goals: Good ADL Goals Pt Will Perform Lower Body Bathing: with adaptive equipment;with set-up;with supervision;with caregiver independent in assisting;sitting/lateral leans (c LRAD) Pt Will Perform Lower Body Dressing: sitting/lateral leans;with set-up;with supervision;with caregiver independent in assisting (c LRAD) Pt Will Transfer to Toilet: bedside commode;with transfer board;anterior/posterior transfer;with set-up;with supervision (drop arm commode) Pt Will Perform Toileting - Clothing Manipulation and hygiene: sitting/lateral leans;with adaptive equipment;with caregiver independent in assisting;with set-up;with supervision  OT Frequency: Min 2X/week   Barriers to D/C:            Co-evaluation              AM-PAC OT "6 Clicks" Daily Activity     Outcome Measure Help from another person eating meals?: None Help from another person taking care of personal grooming?: A Little Help from another person toileting, which includes using toliet, bedpan, or urinal?: A Little Help from another person bathing (including washing, rinsing, drying)?: A Lot Help from another person to put on and taking off regular upper body clothing?: A Little Help from another person to put on and taking off regular lower body clothing?: A Lot 6 Click Score: 17   End of Session    Activity Tolerance: Patient tolerated treatment well Patient left: in bed;with call bell/phone within reach;with bed alarm set;with family/visitor present  OT Visit Diagnosis: Other abnormalities of gait and mobility (R26.89);Pain Pain - Right/Left: Left Pain - part of body: Knee;Leg                Time: 7893-8101 OT Time Calculation (min): 32 min Charges:  OT General Charges $OT Visit: 1 Visit OT Evaluation $OT Eval Moderate Complexity: 1 Mod OT Treatments $Self Care/Home Management : 23-37 mins  Shara Blazing, M.S., OTR/L Ascom: [phone] 11/23/19, 4:05 PM

## 2019-11-23 NOTE — Patient Instructions (Signed)
It is good to talk with you today Your CT Chest shows stable nodules. We will do a follows up CT in 1 year unless Dr.Kasa would like it done sooner. Call if any change, development of hemoptysis/ unintentional weight loss We will order  a home sleep study and swallow evaluation. You will get a call to schedule both We will do a follow up once these studies have been evaluated with Judson Roch NP Continue Flutter valve prn Continue mucinex 1200 mg once daily with a glass of water Continue Advair and Combi vent and Spiriva as you have been doing Rinse mouth after use Continue Xopenex nebs as you have been doing for breakthrough shortness of breath.  Continue Nasal atrovent and Flonase We will Consider  ,Daliresp, Trelegy/ Breztri/ PFT's with repeat flares Note your daily symptoms >remember "red flags" for COPD: Increase in cough, increase in sputum production, increase in shortness of breath or activity intolerance. If you notice these symptoms, please call to be seen. Follow up after swallow eval and home sleep study Follow up with Dr. [name] in 3 months. Please contact office for sooner follow up if symptoms do not improve or worsen or seek emergency care

## 2019-11-23 NOTE — Evaluation (Addendum)
Physical Therapy Evaluation Patient Details Name: Tyler [name] MRN: [id number] DOB: 1955-12-21 Today's Date: 11/23/2019   History of Present Illness  64 y.o. 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  Medical history significant of sCHF with EF of 30%, HTN, COPD on 3 L oxygen, BPH, GERD, AICD placement, CAD, PVD, A-fib, factor V Leyden mutation, depression with anxiety, GERD.  Clinical Impression  Pt eager to work with PT and did well with exercises and transfers/mobility.  He has been a R amputee for decades but had always been able to manage with crutches or a transport chair.  Now being L BKA he does realize that he is going to be that much more limited and acknowledges that if he wants to be able to walk again he has a lot of work to do.  He had been using crutches until ~2 years ago but b/l UE numbness/strength became a liability during this and he has been essentially w/c bound but still able to transfer, etc and be relatively independent though wife helps with baths, etc.  Pt did well with simple to the side transfers using UEs appropriately, apparently he sits in a rocking recliner much of the day and PT did encourage prudence if he intends on using this, suggested a more stable and easier surface to spend much of his time.  Educated on exercises for maintenance of hip and knee ROM/strength and the importance on maintaining this if he is to successfully transition to prosthetic and the goal of functional mobility/ambulation.   Follow Up Recommendations Home health PT;Supervision/Assistance - 24 hour    Equipment Recommendations   (drop arm bedside commode)    Recommendations for Other Services       Precautions / Restrictions Precautions Precautions: Fall Restrictions Weight Bearing Restrictions: Yes LLE Weight Bearing: Non weight bearing (new BKA)      Mobility  Bed Mobility Overal bed mobility: Modified  Independent             General bed mobility comments: Pt able to get himself from supine to sitting EOB w/o assist and with very little hesitations  Transfers Overall transfer level: Modified independent Equipment used: None             General transfer comment: Dropped arm rest down in recliner, able to safely and confidently use UEs to side scoot bed to recliner.  IV in hand was somewhat troublesome but able to use UEs effectively  Ambulation/Gait             General Gait Details: not appropriate for ambulation, does report that he still has his old (and unused for years) R prosthetic   Stairs            Wheelchair Mobility    Modified Rankin (Stroke Patients Only)       Balance Overall balance assessment: Modified Independent (zero balance in standing, good sitting balance )                                           Pertinent Vitals/Pain Pain Assessment: 0-10 Pain Score: 4  Pain Location: modest R stump pain more from swelling/pressure     Home Living Family/patient expects to be discharged to:: Private residence Living Arrangements: Spouse/significant other Available Help at Discharge: Family;Available 24 hours/day Type of Home: Mobile home  Home Access: Ramped entrance     Home Layout: One level Home Equipment: Bedside commode;Wheelchair - [employer] ([employer])      Prior Function Level of Independence: Needs assistance   Gait / Transfers Assistance Needed: Pt reports primarily using crutches for fxl mobility prior to 2018, states that d/t spinal stenosis and decreased sensation in hands, that he has used transport chair since that time and spouse pushes him. While pt has manual w/c and can use UEs to propel it, it is from [place] and bulky which prevents it from fitting in hallways of his home.  In addition, it is too heavy for his spouse to load in/out of vehicle.           Hand Dominance         Extremity/Trunk Assessment                Communication      Cognition Arousal/Alertness: Awake/alert Behavior During Therapy: WFL for tasks assessed/performed Overall Cognitive Status: Within Functional Limits for tasks assessed                                        General Comments      Exercises General Exercises - Lower Extremity Quad Sets: Strengthening;10 reps;Right Short Arc Quad: AROM;10 reps;Right Hip ABduction/ADduction: Strengthening;10 reps;Right   Assessment/Plan    PT Assessment    PT Problem List         PT Treatment Interventions      PT Goals (Current goals can be found in the Care Plan section)  Acute Rehab PT Goals Patient Stated Goal: go home and eventually work on walking with prosthetics PT Goal Formulation: With patient Time For Goal Achievement: 12/07/19 Potential to Achieve Goals: Good    Frequency 7X/week   Barriers to discharge        Co-evaluation               AM-PAC PT "6 Clicks" Mobility  Outcome Measure Help needed turning from your back to your side while in a flat bed without using bedrails?: None Help needed moving from lying on your back to sitting on the side of a flat bed without using bedrails?: None Help needed moving to and from a bed to a chair (including a wheelchair)?: A Little Help needed standing up from a chair using your arms (e.g., wheelchair or bedside chair)?: Total Help needed to walk in hospital room?: Total Help needed climbing 3-5 steps with a railing? : Total 6 Click Score: 14    End of Session Equipment Utilized During Treatment: Gait belt Activity Tolerance: Patient tolerated treatment well Patient left: with chair alarm set;with call bell/phone within reach Nurse Communication: Mobility status PT Visit Diagnosis: Muscle weakness (generalized) (M62.81);Pain;Other abnormalities of gait and mobility (R26.89) Pain - Right/Left: Right Pain - part of body: Knee    Time:  3159-4585 PT Time Calculation (min) (ACUTE ONLY): 35 min   Charges:   PT Evaluation $PT Eval Low Complexity: 1 Low PT Treatments $Therapeutic Activity: 8-22 mins        Kreg Shropshire, DPT 11/23/2019, 2:56 PM

## 2019-11-23 NOTE — Progress Notes (Addendum)
[facility] Daily Progress Note   Subjective: 11/22/19: Left below-the-knee amputation  Patient without complaint this AM.  No issues overnight.  Objective: Vitals:   11/22/19 2124 11/23/19 0259 11/23/19 0755 11/23/19 0848  BP:  106/66  (!) 137/95  Pulse:  77  87  Resp:  18    Temp:  98.7 F (37.1 C)    TempSrc:  Oral    SpO2: 99% 100% 99%   Weight:      Height:        Intake/Output Summary (Last 24 hours) at 11/23/2019 1129 Last data filed at 11/23/2019 0959 Gross per 24 hour  Intake 1460 ml  Output 2200 ml  Net -740 ml   Physical Exam: A&Ox3, NAD CV: RRR Pulmonary: CTA Bilaterally Abdomen: Soft, Nontender, Nondistended Vascular:  Left lower extremity: OR dressing intact, clean and dry.  Flexible at the knee.   Laboratory: CBC    Component Value Date/Time   WBC 10.6 (H) 11/22/2019 1512   HGB 9.7 (L) 11/22/2019 1512   HGB 13.2 01/12/2014 1153   HCT 30.8 (L) 11/22/2019 1512   HCT 39.1 (L) 01/12/2014 1153   PLT 229 11/22/2019 1512   PLT 239 01/12/2014 1153   BMET    Component Value Date/Time   NA 128 (L) 11/22/2019 1512   NA 134 (L) 01/12/2014 1153   K 4.2 11/22/2019 1512   K 4.0 01/12/2014 1153   CL 92 (L) 11/22/2019 1512   CL 96 (L) 01/12/2014 1153   CO2 27 11/22/2019 1512   CO2 31 01/12/2014 1153   GLUCOSE 196 (H) 11/22/2019 1512   GLUCOSE 97 01/12/2014 1153   BUN 8 11/22/2019 1512   BUN 7 01/12/2014 1153   CREATININE 0.77 11/22/2019 1512   CREATININE 0.92 01/12/2014 1153   CALCIUM 8.7 (L) 11/22/2019 1512   CALCIUM 9.2 01/12/2014 1153   GFRNONAA >60 11/22/2019 1512   GFRNONAA >60 01/12/2014 1153   GFRAA >60 11/22/2019 1512   GFRAA >60 01/12/2014 1153   Assessment/Planning: The patient is a 64 year old male status post left lower extremity below the knee amputation POD#1  1) Replete sodium.  BMP in a.m. 2) 1 g drop in hemoglobin this AM.  Monitor CBC 3) Patient is adamant about discharge home and not to a SNF. 4) Will  consult PT/OT and social work and attempt to make sure the patient has the appropriate DME at home 5) restarted patient's Coumadin last night.  Follow INR.  Discussed with Dr. Ellis Parents Alyn Riedinger PA-C 11/23/2019 11:29 AM

## 2019-11-23 NOTE — Progress Notes (Signed)
[facility] hospital liaison  Note:  Patient is currently followed by [facility] community Palliative program at home. TOC [place] aware.  Flo Shanks BSN, RN, [place] [phone]

## 2019-11-24 ENCOUNTER — Ambulatory Visit (INDEPENDENT_AMBULATORY_CARE_PROVIDER_SITE_OTHER): Payer: Medicare HMO | Admitting: Vascular Surgery

## 2019-11-24 LAB — MAGNESIUM: Magnesium: 1.5 mg/dL — ABNORMAL LOW (ref 1.7–2.4)

## 2019-11-24 LAB — BASIC METABOLIC PANEL
Anion gap: 9 (ref 5–15)
BUN: 13 mg/dL (ref 8–23)
CO2: 25 mmol/L (ref 22–32)
Calcium: 8.6 mg/dL — ABNORMAL LOW (ref 8.9–10.3)
Chloride: 92 mmol/L — ABNORMAL LOW (ref 98–111)
Creatinine, Ser: 0.76 mg/dL (ref 0.61–1.24)
GFR calc Af Amer: >60 mL/min (ref >60)
GFR calc non Af Amer: >60 mL/min (ref >60)
Glucose, Bld: 99 mg/dL (ref 70–99)
Potassium: 4.1 mmol/L (ref 3.5–5.1)
Sodium: 126 mmol/L — ABNORMAL LOW (ref 135–145)

## 2019-11-24 LAB — CBC
HCT: 23.9 % — ABNORMAL LOW (ref 39.0–52.0)
Hemoglobin: 8.2 g/dL — ABNORMAL LOW (ref 13.0–17.0)
MCH: 29.1 pg (ref 26.0–34.0)
MCHC: 34.3 g/dL (ref 30.0–36.0)
MCV: 84.8 fL (ref 80.0–100.0)
Platelets: 190 10*3/uL (ref 150–400)
RBC: 2.82 MIL/uL — ABNORMAL LOW (ref 4.22–5.81)
RDW: 14 % (ref 11.5–15.5)
WBC: 8.9 10*3/uL (ref 4.0–10.5)
nRBC: 0 % (ref 0.0–0.2)

## 2019-11-24 LAB — PROTIME-INR
INR: 1.4 — ABNORMAL HIGH (ref 0.8–1.2)
Prothrombin Time: 17 seconds — ABNORMAL HIGH (ref 11.4–15.2)

## 2019-11-24 LAB — SURGICAL PATHOLOGY

## 2019-11-24 MED ORDER — SODIUM CHLORIDE 1 G PO TABS
2.0000 g | ORAL_TABLET | Freq: Two times a day (BID) | ORAL | Status: AC
  Administered 2019-11-24 (×2): 2 g via ORAL
  Filled 2019-11-24 (×2): qty 2

## 2019-11-24 MED ORDER — MAGNESIUM SULFATE 2 GM/50ML IV SOLN
2.0000 g | Freq: Once | INTRAVENOUS | Status: AC
  Administered 2019-11-24: 2 g via INTRAVENOUS
  Filled 2019-11-24: qty 50

## 2019-11-24 MED ORDER — BISACODYL 5 MG PO TBEC: 5.0000 mg | DELAYED_RELEASE_TABLET | Freq: Every day | ORAL | Status: DC | PRN

## 2019-11-24 NOTE — Plan of Care (Signed)
Pt moved bowels x 1 today.

## 2019-11-24 NOTE — Progress Notes (Signed)
Physical Therapy Treatment Patient Details Name: Tyler [name] MRN: [id number] DOB: 11/22/1955 Today's Date: 11/24/2019    History of Present Illness 64 y.o. 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  Medical history significant of sCHF with EF of 30%, HTN, COPD on 3 L oxygen, BPH, GERD, AICD placement, CAD, PVD, A-fib, factor V Leyden mutation, depression with anxiety, GERD.    PT Comments    Pt did well with exercises for b/l residual limbs, focused on L >R with light resistance per pt tolerance.  Continued to stress the import of maintaining knee and hip extension ROM for eventual transition to prosthetics/ambulation.  Overall pt remains very eager and motivated but does fatigue quickly (vitals remain WNL) with the need for occasional rest breaks.  Great effort and attitude with PT.   Follow Up Recommendations  Home health PT;Supervision/Assistance - 24 hour     Equipment Recommendations   (drop arm BSC)    Recommendations for Other Services       Precautions / Restrictions Precautions Precautions: Fall Restrictions Weight Bearing Restrictions: Yes RLE Weight Bearing: Non weight bearing LLE Weight Bearing: Weight bearing as tolerated (through stump)    Mobility  Bed Mobility Overal bed mobility: Modified Independent             General bed mobility comments: Pt c/o pain in abdomen (reports as muscle soreness)  Transfers Overall transfer level: Modified independent Equipment used: None             General transfer comment: bed height matched to recliner height (arm rest down) and pt was able to safely and confidently transfer to recliner without safety concerns.  He did have some fatigued with the effort but O2 remains in high 90s and HR in the 70s.  Ambulation/Gait                 Stairs             Wheelchair Mobility    Modified Rankin (Stroke Patients Only)        Balance Overall balance assessment: Needs assistance                                          Cognition Arousal/Alertness: Awake/alert Behavior During Therapy: WFL for tasks assessed/performed Overall Cognitive Status: Within Functional Limits for tasks assessed                                 General Comments: Pt pleasant, conversational, eager to participate in therapy session.      Exercises General Exercises - Lower Extremity Quad Sets: Strengthening;15 reps;Left Gluteal Sets: Strengthening;10 reps;Both Short Arc Quad: Strengthening;AROM;Left;10 reps Heel Slides: Strengthening;10 reps;Left (hamstring curls) Hip ABduction/ADduction: Strengthening;10 reps;Both Other Exercises Other Exercises: sidelying hip extension stretches 3 X 20 sec, to neutral but no extension    General Comments        Pertinent Vitals/Pain Pain Assessment: 0-10 Pain Score: 4  Pain Location: L knee and distal stump, also having some abdominal muscle soreness    Home Living                      Prior Function            PT Goals (current  goals can now be found in the care plan section) Progress towards PT goals: Progressing toward goals    Frequency    7X/week      PT Plan Current plan remains appropriate    Co-evaluation              AM-PAC PT "6 Clicks" Mobility   Outcome Measure  Help needed turning from your back to your side while in a flat bed without using bedrails?: None Help needed moving from lying on your back to sitting on the side of a flat bed without using bedrails?: None Help needed moving to and from a bed to a chair (including a wheelchair)?: A Little Help needed standing up from a chair using your arms (e.g., wheelchair or bedside chair)?: Total Help needed to walk in hospital room?: Total Help needed climbing 3-5 steps with a railing? : Total 6 Click Score: 14    End of Session   Activity Tolerance: Patient  tolerated treatment well;Patient limited by fatigue Patient left: with chair alarm set;with call bell/phone within reach Nurse Communication: Mobility status PT Visit Diagnosis: Muscle weakness (generalized) (M62.81);Pain;Other abnormalities of gait and mobility (R26.89) Pain - Right/Left: Left Pain - part of body: Knee     Time: 5188-4166 PT Time Calculation (min) (ACUTE ONLY): 30 min  Charges:  $Therapeutic Exercise: 8-22 mins $Therapeutic Activity: 8-22 mins                     Kreg Shropshire, DPT 11/24/2019, 1:14 PM

## 2019-11-24 NOTE — Care Management Important Message (Signed)
Important Message  Patient Details  Name: Tyler [name] MRN: [id number] Date of Birth: April 25, 1956   Medicare Important Message Given:  Yes  Initial Medicare IM given by Patient Access Associate on 11/23/2019 at 11:12am.     Dannette Barbara 11/24/2019, 8:22 AM

## 2019-11-24 NOTE — Progress Notes (Addendum)
[facility] Daily Progress Note   Subjective: 11/22/19: Left below-the-knee amputation  No issues overnight.  Patient with continued but manageable left stump pain.  Patient continues to want discharge home and not SNF.  Objective: Vitals:   11/24/19 0528 11/24/19 0751 11/24/19 0904 11/24/19 1305  BP: 107/68  (!) 108/48 (!) 99/52  Pulse:  78 74 75  Resp:  17  16  Temp:    98.5 F (36.9 C)  TempSrc:    Oral  SpO2:  96% 99% 100%  Weight:      Height:       No intake or output data in the 24 hours ending 11/24/19 1315  Physical Exam: A&Ox3, NAD CV: RRR Pulmonary: CTA Bilaterally Abdomen: Soft, Nontender, Nondistended Vascular:             Left lower extremity: OR dressing intact, clean and dry.  Flexible at the knee.   Laboratory: CBC    Component Value Date/Time   WBC 8.9 11/24/2019 0422   HGB 8.2 (L) 11/24/2019 0422   HGB 13.2 01/12/2014 1153   HCT 23.9 (L) 11/24/2019 0422   HCT 39.1 (L) 01/12/2014 1153   PLT 190 11/24/2019 0422   PLT 239 01/12/2014 1153   BMET    Component Value Date/Time   NA 126 (L) 11/24/2019 0422   NA 134 (L) 01/12/2014 1153   K 4.1 11/24/2019 0422   K 4.0 01/12/2014 1153   CL 92 (L) 11/24/2019 0422   CL 96 (L) 01/12/2014 1153   CO2 25 11/24/2019 0422   CO2 31 01/12/2014 1153   GLUCOSE 99 11/24/2019 0422   GLUCOSE 97 01/12/2014 1153   BUN 13 11/24/2019 0422   BUN 7 01/12/2014 1153   CREATININE 0.76 11/24/2019 0422   CREATININE 0.92 01/12/2014 1153   CALCIUM 8.6 (L) 11/24/2019 0422   CALCIUM 9.2 01/12/2014 1153   GFRNONAA >60 11/24/2019 0422   GFRNONAA >60 01/12/2014 1153   GFRAA >60 11/24/2019 0422   GFRAA >60 01/12/2014 1153   Assessment/Planning: The patient is a 64 year old male status post left lower extremity below the knee amputation POD#  1) Replete sodium / magnesium.  BMP in a.m. 2) 1 g drop in hemoglobin this AM.  Asymptomatic. Monitor CBC. 3) Patient is adamant about discharge home and not  to a SNF. 4) Appreciate PT/OT and social work assistance to make sure the patient has the appropriate DME / services at home 5) Restarted patient's Coumadin last night.  Follow INR. 6) OR dressing change Sunday. If stump healthy can most likely discharged home on Sunday as well.   Discussed with Dr. Ellis Parents Meron Bocchino PA-C 11/24/2019 1:15 PM

## 2019-11-25 LAB — CBC
HCT: 23.2 % — ABNORMAL LOW (ref 39.0–52.0)
Hemoglobin: 7.5 g/dL — ABNORMAL LOW (ref 13.0–17.0)
MCH: 28.3 pg (ref 26.0–34.0)
MCHC: 32.3 g/dL (ref 30.0–36.0)
MCV: 87.5 fL (ref 80.0–100.0)
Platelets: 215 10*3/uL (ref 150–400)
RBC: 2.65 MIL/uL — ABNORMAL LOW (ref 4.22–5.81)
RDW: 13.9 % (ref 11.5–15.5)
WBC: 8 10*3/uL (ref 4.0–10.5)
nRBC: 0 % (ref 0.0–0.2)

## 2019-11-25 LAB — MAGNESIUM: Magnesium: 2 mg/dL (ref 1.7–2.4)

## 2019-11-25 LAB — BASIC METABOLIC PANEL
Anion gap: 9 (ref 5–15)
BUN: 10 mg/dL (ref 8–23)
CO2: 27 mmol/L (ref 22–32)
Calcium: 8.6 mg/dL — ABNORMAL LOW (ref 8.9–10.3)
Chloride: 91 mmol/L — ABNORMAL LOW (ref 98–111)
Creatinine, Ser: 0.55 mg/dL — ABNORMAL LOW (ref 0.61–1.24)
GFR calc Af Amer: >60 mL/min (ref >60)
GFR calc non Af Amer: >60 mL/min (ref >60)
Glucose, Bld: 89 mg/dL (ref 70–99)
Potassium: 4 mmol/L (ref 3.5–5.1)
Sodium: 127 mmol/L — ABNORMAL LOW (ref 135–145)

## 2019-11-25 LAB — PROTIME-INR
INR: 1.9 — ABNORMAL HIGH (ref 0.8–1.2)
Prothrombin Time: 20.9 seconds — ABNORMAL HIGH (ref 11.4–15.2)

## 2019-11-25 MED ORDER — IPRATROPIUM-ALBUTEROL 0.5-2.5 (3) MG/3ML IN SOLN
3.0000 mL | Freq: Four times a day (QID) | RESPIRATORY_TRACT | Status: DC
  Administered 2019-11-25 – 2019-11-29 (×14): 3 mL via RESPIRATORY_TRACT
  Filled 2019-11-25 (×16): qty 3

## 2019-11-25 NOTE — Progress Notes (Signed)
    Subjective  - POD #3, s/p left BKA  No complaints this morning   Physical Exam:  Left BKA dressing is clean and dry.       Assessment/Plan:  POD #3  Acute blood loss anemia: Hemoglobin is 7.5 today.  Yesterday was 8.2 and the day prior was 9.7.  I will continue to monitor this.  I will check a CBC in the morning.  His INR is up to 1.9 today  Blood pressure: The patient is systolic pressures are ranging from 90-1 10.  His Lasix and Entresto are being held.  I will plan on changing his dressing tomorrow.  If this looks okay and his hemoglobin is acceptable, he will likely go home.  He has been doing okay with transfers.  Tyler [name] 11/25/2019 2:47 PM --  Vitals:   11/25/19 1159 11/25/19 1426  BP: (!) 107/45   Pulse: 67   Resp:    Temp: 97.9 F (36.6 C)   SpO2: 96% 96%    Intake/Output Summary (Last 24 hours) at 11/25/2019 1447 Last data filed at 11/25/2019 8115 Gross per 24 hour  Intake 400 ml  Output 700 ml  Net -300 ml     Laboratory CBC    Component Value Date/Time   WBC 8.0 11/25/2019 0540   HGB 7.5 (L) 11/25/2019 0540   HGB 13.2 01/12/2014 1153   HCT 23.2 (L) 11/25/2019 0540   HCT 39.1 (L) 01/12/2014 1153   PLT 215 11/25/2019 0540   PLT 239 01/12/2014 1153    BMET    Component Value Date/Time   NA 127 (L) 11/25/2019 0540   NA 134 (L) 01/12/2014 1153   K 4.0 11/25/2019 0540   K 4.0 01/12/2014 1153   CL 91 (L) 11/25/2019 0540   CL 96 (L) 01/12/2014 1153   CO2 27 11/25/2019 0540   CO2 31 01/12/2014 1153   GLUCOSE 89 11/25/2019 0540   GLUCOSE 97 01/12/2014 1153   BUN 10 11/25/2019 0540   BUN 7 01/12/2014 1153   CREATININE 0.55 (L) 11/25/2019 0540   CREATININE 0.92 01/12/2014 1153   CALCIUM 8.6 (L) 11/25/2019 0540   CALCIUM 9.2 01/12/2014 1153   GFRNONAA >60 11/25/2019 0540   GFRNONAA >60 01/12/2014 1153   GFRAA >60 11/25/2019 0540   GFRAA >60 01/12/2014 1153    COAG Lab Results  Component Value Date   INR 1.9 (H) 11/25/2019    INR 1.4 (H) 11/24/2019   INR 1.2 11/22/2019   No results found for: PTT  Antibiotics Anti-infectives (From admission, onward)   Start     Dose/Rate Route Frequency Ordered Stop   11/22/19 1900  amoxicillin-clavulanate (AUGMENTIN) 875-125 MG per tablet 1 tablet     Discontinue     1 tablet Oral Every 12 hours 11/22/19 1505     11/22/19 0932  ceFAZolin (ANCEF) 2-4 GM/100ML-% IVPB       Note to Pharmacy: Sylvester Harder   : cabinet override      11/22/19 0932 11/22/19 1156   11/22/19 0600  ceFAZolin (ANCEF) IVPB 2g/100 mL premix        2 g 200 mL/hr over 30 Minutes Intravenous On call to O.R. 11/22/19 0215 11/22/19 1152       V. Leia Alf, M.D., [facility] [place] Office: [phone] Pager:  [phone]

## 2019-11-25 NOTE — Progress Notes (Signed)
Per Dr. Lucky Cowboy, okay to hold metoprolol, lasix, and enestro r/t hypotension. Will continue to monitor.

## 2019-11-25 NOTE — Progress Notes (Signed)
Physical Therapy Treatment Patient Details Name: Tyler [name] MRN: [id number] DOB: 1956/01/10 Today's Date: 11/25/2019    History of Present Illness 64 y.o. 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  Medical history significant of sCHF with EF of 30%, HTN, COPD on 3 L oxygen, BPH, GERD, AICD placement, CAD, PVD, A-fib, factor V Leyden mutation, depression with anxiety, GERD.    PT Comments    Pt was long sitting in bed with pillow under L knee upon arriving. Therapist educated pt on proper positioning and avoiding prolonged knee flexion. He states understanding. Chief Strategy Officer issued pt HEP handout with explanation and performance. Pt requires constant O2 to maintain > 90 % sao2. He required no assistance to safe transfer from bed to recliner but cued for improved technique. Overall pt tolerated session well. He will benefit from continued HHPT at DC to address strength, ROM, and safe functional mobility deficits. RN aware of pt's abilities and request for pain meds at conclusion of session.      Follow Up Recommendations  Home health PT;Supervision/Assistance - 24 hour     Equipment Recommendations  None recommended by PT (pt has all equipment needs met)    Recommendations for Other Services       Precautions / Restrictions Precautions Precautions: Fall Restrictions Weight Bearing Restrictions: Yes LLE Weight Bearing: Non weight bearing Other Position/Activity Restrictions: Pt with hx of R AKA    Mobility  Bed Mobility Overal bed mobility: Modified Independent             General bed mobility comments: Pt was able to perform rolling/lateral scoot  without assistance. does continue to endorse abdominal pain at times.  Transfers Overall transfer level: Modified independent Equipment used: None             General transfer comment: Pt demonstrated safe ability to lateral scott from bed to recliner with L  armrest dropped on recliner and bed height matching surface height. Pt reports he has been performing transfers with slideboard/ and without board for years.   Ambulation/Gait             General Gait Details: unable/unsafe until pt is properly fitted for BLE prosthetics   Stairs             Wheelchair Mobility    Modified Rankin (Stroke Patients Only)       Balance Overall balance assessment: Needs assistance Sitting-balance support: Bilateral upper extremity supported;Single extremity supported Sitting balance-Leahy Scale: Good Sitting balance - Comments: pt sat without LOB but does require UE support to maintain     Standing balance-Leahy Scale: Zero                              Cognition Arousal/Alertness: Awake/alert Behavior During Therapy: WFL for tasks assessed/performed Overall Cognitive Status: Within Functional Limits for tasks assessed                                 General Comments: Pt pleasant, conversational, eager to participate in therapy session.      Exercises Amputee Exercises Quad Sets: Strengthening;10 reps Gluteal Sets: Strengthening;10 reps Towel Squeeze: Strengthening;10 reps Hip Extension: Strengthening;10 reps Hip ABduction/ADduction: Strengthening;10 reps Straight Leg Raises: Strengthening;10 reps Other Exercises Other Exercises: Therapist educated pt on proper positioning to promote knee ext and hip  ext. He states understanding. Amputee handout given. Pt unable to perform prone exercises 2/2 to breathing/SOB concerns    General Comments        Pertinent Vitals/Pain Pain Assessment: 0-10 Pain Score: 6  Pain Location: L knee and distal stump, also having some abdominal muscle soreness Pain Descriptors / Indicators: Sore;Constant;Tightness Pain Intervention(s): Limited activity within patient's tolerance;Monitored during session;Premedicated before session;Repositioned    Home Living                       Prior Function            PT Goals (current goals can now be found in the care plan section) Acute Rehab PT Goals Patient Stated Goal: go home and eventually work on walking with prosthetics Progress towards PT goals: Progressing toward goals    Frequency    7X/week      PT Plan Current plan remains appropriate    Co-evaluation              AM-PAC PT "6 Clicks" Mobility   Outcome Measure  Help needed turning from your back to your side while in a flat bed without using bedrails?: None Help needed moving from lying on your back to sitting on the side of a flat bed without using bedrails?: None Help needed moving to and from a bed to a chair (including a wheelchair)?: None Help needed standing up from a chair using your arms (e.g., wheelchair or bedside chair)?: Total Help needed to walk in hospital room?: Total Help needed climbing 3-5 steps with a railing? : Total 6 Click Score: 15    End of Session   Activity Tolerance: Patient tolerated treatment well;Patient limited by fatigue Patient left: with chair alarm set;with call bell/phone within reach Nurse Communication: Mobility status PT Visit Diagnosis: Muscle weakness (generalized) (M62.81);Pain;Other abnormalities of gait and mobility (R26.89) Pain - Right/Left: Left Pain - part of body: Knee     Time: 1012-1030 PT Time Calculation (min) (ACUTE ONLY): 18 min  Charges:  $Therapeutic Activity: 8-22 mins                     Julaine Fusi PTA 11/25/19, 10:43 AM

## 2019-11-26 ENCOUNTER — Inpatient Hospital Stay: Payer: Medicare HMO

## 2019-11-26 LAB — PROTIME-INR
INR: 2.2 — ABNORMAL HIGH (ref 0.8–1.2)
Prothrombin Time: 23.7 seconds — ABNORMAL HIGH (ref 11.4–15.2)

## 2019-11-26 LAB — BASIC METABOLIC PANEL
Anion gap: 9 (ref 5–15)
BUN: 7 mg/dL — ABNORMAL LOW (ref 8–23)
CO2: 29 mmol/L (ref 22–32)
Calcium: 8.8 mg/dL — ABNORMAL LOW (ref 8.9–10.3)
Chloride: 91 mmol/L — ABNORMAL LOW (ref 98–111)
Creatinine, Ser: 0.54 mg/dL — ABNORMAL LOW (ref 0.61–1.24)
GFR calc Af Amer: >60 mL/min (ref >60)
GFR calc non Af Amer: >60 mL/min (ref >60)
Glucose, Bld: 98 mg/dL (ref 70–99)
Potassium: 4 mmol/L (ref 3.5–5.1)
Sodium: 129 mmol/L — ABNORMAL LOW (ref 135–145)

## 2019-11-26 LAB — CBC
HCT: 23 % — ABNORMAL LOW (ref 39.0–52.0)
Hemoglobin: 7.8 g/dL — ABNORMAL LOW (ref 13.0–17.0)
MCH: 28.8 pg (ref 26.0–34.0)
MCHC: 33.9 g/dL (ref 30.0–36.0)
MCV: 84.9 fL (ref 80.0–100.0)
Platelets: 256 10*3/uL (ref 150–400)
RBC: 2.71 MIL/uL — ABNORMAL LOW (ref 4.22–5.81)
RDW: 13.9 % (ref 11.5–15.5)
WBC: 6.9 10*3/uL (ref 4.0–10.5)
nRBC: 0 % (ref 0.0–0.2)

## 2019-11-26 LAB — MAGNESIUM: Magnesium: 1.8 mg/dL (ref 1.7–2.4)

## 2019-11-26 MED ORDER — SACUBITRIL-VALSARTAN 24-26 MG PO TABS: 1.0000 | ORAL_TABLET | Freq: Two times a day (BID) | ORAL | Status: DC

## 2019-11-26 MED ORDER — FUROSEMIDE 10 MG/ML IJ SOLN
20.0000 mg | Freq: Once | INTRAMUSCULAR | Status: AC
  Administered 2019-11-26: 20 mg via INTRAVENOUS
  Filled 2019-11-26: qty 4

## 2019-11-26 NOTE — Progress Notes (Signed)
Physical Therapy Treatment Patient Details Name: Tyler [name] MRN: [id number] DOB: [birth year] Today's Date: 11/26/2019    History of Present Illness 64 y.o. 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  Medical history significant of sCHF with EF of 30%, HTN, COPD on 3 L oxygen, BPH, GERD, AICD placement, CAD, PVD, A-fib, factor V Leyden mutation, depression with anxiety, GERD.    PT Comments    Patient is progressing well. Progressed LE strengthening exercise with increased repetition as well as advancing hip abductor strengthening in sidelying against gravity. Patient does require min VCs for proper positioning/exercise technique. He was able to tolerate well. He is mod I for bed mobility and scoot transfers. He did become short of breath after transfers; SPo2 monitored, initially read 75%, but then quickly recovered to 90% on 2L O2; Concerned for accuracy of pulse oximetry as patient has poor circulation in hands which could affect reading; Pt would benefit from additional skilled PT intervention to improve strength and mobility;      Follow Up Recommendations  Home health PT;Supervision/Assistance - 24 hour     Equipment Recommendations  None recommended by PT (pt has all equipment needs met)    Recommendations for Other Services       Precautions / Restrictions Precautions Precautions: Fall Restrictions Weight Bearing Restrictions: Yes RLE Weight Bearing: Non weight bearing LLE Weight Bearing: Non weight bearing Other Position/Activity Restrictions: Pt with hx of R AKA    Mobility  Bed Mobility Overal bed mobility: Modified Independent             General bed mobility comments: pt is mod I for rolling, scooting and positioning in bed without assistance; he does have increased abdominal pain especially when activating his core abdominals;  Transfers Overall transfer level: Modified  independent Equipment used: None             General transfer comment: pt transferred scoot pivot from bed to recliner with left armrest dropped, level surface; he reports less difficulty when using sliding board but was able to transfer well with good control without board; x1 rep;  Ambulation/Gait             General Gait Details: unable/unsafe until pt is properly fitted for BLE prosthetics   Stairs             Wheelchair Mobility    Modified Rankin (Stroke Patients Only)       Balance Overall balance assessment: Needs assistance Sitting-balance support: Bilateral upper extremity supported;Single extremity supported Sitting balance-Leahy Scale: Good Sitting balance - Comments: able to sit edge of bed but does require UE assist for balance control;                                    Cognition Arousal/Alertness: Awake/alert Behavior During Therapy: WFL for tasks assessed/performed Overall Cognitive Status: Within Functional Limits for tasks assessed                                 General Comments: Pt pleasant, conversational, eager to participate in therapy session.      Exercises General Exercises - Lower Extremity Quad Sets: Strengthening;10 reps;Left Gluteal Sets: Strengthening;10 reps;Both Short Arc Quad: Strengthening;AROM;Left;15 reps Heel Slides: Strengthening;10 reps;Left (hamstring curls) Hip ABduction/ADduction: Strengthening;10 reps;Sidelying;Left Other Exercises Other  Exercises: Patient requires min VCs for proper positioning and exercise technique; He does exhibit increased fasiculation with quad set/SAQ likely from weakness and poor motor control in LLE Other Exercises: progressed hip abductor strengthening to sidelying position with good tolerance;    General Comments        Pertinent Vitals/Pain Pain Assessment: 0-10 Pain Score: 7  Pain Location: LLE distal stump Pain Descriptors / Indicators:  Aching;Sore Pain Intervention(s): Limited activity within patient's tolerance;Monitored during session;Repositioned    Home Living                      Prior Function            PT Goals (current goals can now be found in the care plan section) Acute Rehab PT Goals Patient Stated Goal: go home and eventually work on walking with prosthetics Time For Goal Achievement: 12/07/19 Potential to Achieve Goals: Good Progress towards PT goals: Progressing toward goals    Frequency    7X/week      PT Plan Current plan remains appropriate    Co-evaluation              AM-PAC PT "6 Clicks" Mobility   Outcome Measure  Help needed turning from your back to your side while in a flat bed without using bedrails?: None Help needed moving from lying on your back to sitting on the side of a flat bed without using bedrails?: None Help needed moving to and from a bed to a chair (including a wheelchair)?: None Help needed standing up from a chair using your arms (e.g., wheelchair or bedside chair)?: Total Help needed to walk in hospital room?: Total Help needed climbing 3-5 steps with a railing? : Total 6 Click Score: 15    End of Session   Activity Tolerance: Patient tolerated treatment well;Patient limited by fatigue Patient left: with chair alarm set;with call bell/phone within reach;in chair Nurse Communication: Mobility status PT Visit Diagnosis: Muscle weakness (generalized) (M62.81);Pain;Other abnormalities of gait and mobility (R26.89) Pain - Right/Left: Left Pain - part of body: Knee     Time: 1531-1555 PT Time Calculation (min) (ACUTE ONLY): 24 min  Charges:  $Therapeutic Exercise: 23-37 mins                        Trotter,Margaret PT, DPT 11/26/2019, 4:13 PM

## 2019-11-26 NOTE — TOC Progression Note (Signed)
Transition of Care [facility]) - Progression Note    Patient Details  Name: Tyler [name] MRN: [id number] Date of Birth: [birth year]  [facility]) CM/SW Contact  Meriel Flavors, LCSW Phone Number: 11/26/2019, 11:58 AM  Clinical Narrative:    Doctor decided to keep patient until Monday. Dr. Geraldine Solar to check hemoglobin and remove dressing before discharging    Expected Discharge Plan: [place] Barriers to Discharge: Continued Medical Work up  Expected Discharge Plan and Services Expected Discharge Plan: [place] arrangements for the past 2 months: [place]                 DME Arranged: 3-N-1 (with drop arms) DME Agency: [employer] Date DME Agency Contacted: 11/23/19   Representative spoke with at DME Agency: Andree Coss HH Arranged: RN, PT, OT [facility] Agency: [facility] [place] Date [place]: 11/23/19   Representative spoke with at [place]: [place] ([place]) Interventions    Readmission Risk Interventions Readmission Risk Prevention Plan 11/23/2019  Transportation Screening Complete  PCP or Specialist Appt within 3-5 Days Complete  HRI or [place] Complete  Social Work Consult for [place] Planning/Counseling Complete  Palliative Care Screening Complete  Medication Review Press photographer) Complete  Some recent data might be hidden

## 2019-11-26 NOTE — Progress Notes (Signed)
° ° °  Subjective  - POD #4, s/p left BKA  Feels a little short of breath today   Physical Exam:  Dressing from left BKA removed.  The kerlix was wet, however the incision looked very good.  There was one area where a few drops of blood came through the incision       Assessment/Plan:  POD #4  Shortness of breath:  Patient is on home O2.  He is feeling a little short of breath today.  His Entresto and lasix have been on hold for soft BP.  Xray does not show any cardiopulmonary disease.  I will restart lasix and Entresto.  He will stay another day for monitoring  Wells Tanis Hensarling 11/26/2019 2:38 PM --  Vitals:   11/26/19 0855 11/26/19 1133  BP: (!) 114/54 (!) 111/45  Pulse: 78 80  Resp:    Temp:  97.6 F (36.4 C)  SpO2:  97%    Intake/Output Summary (Last 24 hours) at 11/26/2019 1438 Last data filed at 11/26/2019 0904 Gross per 24 hour  Intake 1220 ml  Output 1350 ml  Net -130 ml     Laboratory CBC    Component Value Date/Time   WBC 6.9 11/26/2019 0425   HGB 7.8 (L) 11/26/2019 0425   HGB 13.2 01/12/2014 1153   HCT 23.0 (L) 11/26/2019 0425   HCT 39.1 (L) 01/12/2014 1153   PLT 256 11/26/2019 0425   PLT 239 01/12/2014 1153    BMET    Component Value Date/Time   NA 129 (L) 11/26/2019 0425   NA 134 (L) 01/12/2014 1153   K 4.0 11/26/2019 0425   K 4.0 01/12/2014 1153   CL 91 (L) 11/26/2019 0425   CL 96 (L) 01/12/2014 1153   CO2 29 11/26/2019 0425   CO2 31 01/12/2014 1153   GLUCOSE 98 11/26/2019 0425   GLUCOSE 97 01/12/2014 1153   BUN 7 (L) 11/26/2019 0425   BUN 7 01/12/2014 1153   CREATININE 0.54 (L) 11/26/2019 0425   CREATININE 0.92 01/12/2014 1153   CALCIUM 8.8 (L) 11/26/2019 0425   CALCIUM 9.2 01/12/2014 1153   GFRNONAA >60 11/26/2019 0425   GFRNONAA >60 01/12/2014 1153   GFRAA >60 11/26/2019 0425   GFRAA >60 01/12/2014 1153    COAG Lab Results  Component Value Date   INR 2.2 (H) 11/26/2019   INR 1.9 (H) 11/25/2019   INR 1.4 (H) 11/24/2019   No  results found for: PTT  Antibiotics Anti-infectives (From admission, onward)   Start     Dose/Rate Route Frequency Ordered Stop   11/22/19 1900  amoxicillin-clavulanate (AUGMENTIN) 875-125 MG per tablet 1 tablet     Discontinue     1 tablet Oral Every 12 hours 11/22/19 1505     11/22/19 0932  ceFAZolin (ANCEF) 2-4 GM/100ML-% IVPB       Note to Pharmacy: Sylvester Harder   : cabinet override      11/22/19 0932 11/22/19 1156   11/22/19 0600  ceFAZolin (ANCEF) IVPB 2g/100 mL premix        2 g 200 mL/hr over 30 Minutes Intravenous On call to O.R. 11/22/19 0215 11/22/19 1152       V. Leia Alf, M.D., [facility] [place] Office: [phone] Pager:  [phone]

## 2019-11-27 LAB — BASIC METABOLIC PANEL
Anion gap: 9 (ref 5–15)
BUN: 7 mg/dL — ABNORMAL LOW (ref 8–23)
CO2: 29 mmol/L (ref 22–32)
Calcium: 8.8 mg/dL — ABNORMAL LOW (ref 8.9–10.3)
Chloride: 92 mmol/L — ABNORMAL LOW (ref 98–111)
Creatinine, Ser: 0.56 mg/dL — ABNORMAL LOW (ref 0.61–1.24)
GFR calc Af Amer: >60 mL/min (ref >60)
GFR calc non Af Amer: >60 mL/min (ref >60)
Glucose, Bld: 91 mg/dL (ref 70–99)
Potassium: 4 mmol/L (ref 3.5–5.1)
Sodium: 130 mmol/L — ABNORMAL LOW (ref 135–145)

## 2019-11-27 LAB — CBC
HCT: 25.4 % — ABNORMAL LOW (ref 39.0–52.0)
Hemoglobin: 8.6 g/dL — ABNORMAL LOW (ref 13.0–17.0)
MCH: 28.6 pg (ref 26.0–34.0)
MCHC: 33.9 g/dL (ref 30.0–36.0)
MCV: 84.4 fL (ref 80.0–100.0)
Platelets: 328 10*3/uL (ref 150–400)
RBC: 3.01 MIL/uL — ABNORMAL LOW (ref 4.22–5.81)
RDW: 13.5 % (ref 11.5–15.5)
WBC: 6.5 10*3/uL (ref 4.0–10.5)
nRBC: 0 % (ref 0.0–0.2)

## 2019-11-27 LAB — BRAIN NATRIURETIC PEPTIDE: B Natriuretic Peptide: 253.5 pg/mL — ABNORMAL HIGH (ref 0.0–100.0)

## 2019-11-27 LAB — MAGNESIUM: Magnesium: 1.8 mg/dL (ref 1.7–2.4)

## 2019-11-27 LAB — PROTIME-INR
INR: 2.5 — ABNORMAL HIGH (ref 0.8–1.2)
Prothrombin Time: 26.3 seconds — ABNORMAL HIGH (ref 11.4–15.2)

## 2019-11-27 MED ORDER — PREDNISONE 20 MG PO TABS
50.0000 mg | ORAL_TABLET | Freq: Every day | ORAL | Status: DC
  Administered 2019-11-28 – 2019-11-30 (×3): 50 mg via ORAL
  Filled 2019-11-27 (×3): qty 3

## 2019-11-27 NOTE — Progress Notes (Signed)
Area observed on left and right buttocks. Foam dressing applied, but d/t patient slides self from bed to BSC, foam does not stay on appropriately. Patient's buttocks have become irritated due to friction. Cleansed area, placed barrier cream and applied new foam dressing. Requested trapeze for alleviate friction on transfers.   Fuller Mandril, RN

## 2019-11-27 NOTE — Care Management Important Message (Signed)
Important Message  Patient Details  Name: Tyler [name] MRN: [id number] Date of Birth: Oct 17, 1955   Medicare Important Message Given:  Yes     Dannette Barbara 11/27/2019, 11:13 AM

## 2019-11-27 NOTE — Consult Note (Signed)
Pulmonary Medicine          Date: 11/27/2019,   MRN# [id number] Tyler [name] 08-22-1955     AdmissionWeight: 74.8 kg                 CurrentWeight: 74.8 kg   Referring physician: Dr Clayborn Bigness    CHIEF COMPLAINT:   SOB with increased O2 requirment   HISTORY OF PRESENT ILLNESS   This is a patient with a history of atrial fibrillation, anxiety disorder, depression, centrilobular emphysema with COPD as well as CHF who has significant peripheral artery disease and came into the hospital with left lower extremity gangrene with BKA scheduled.  Patient was noted to have persistent shortness of breath and pulmonary consultation was placed due to concerns for involvement of his COPD.  Patient was previously seen at [facility] by Dr. Maylene Roes as well as by [facility] Dr. [name].  He is chronically hypoxemic and uses oxygen at 2 L supplemental nasal cannula at home, inhaler therapy includes Advair and Spiriva at home.  His last COPD exacerbation was in May 2020. He shares that over past week he noted more discolored phlegm and its now in higher volume. He states that normally he can breathe better then he is currently and at this time he feels that any slight movement around bed can cause him dyspnea.     PAST MEDICAL HISTORY   Past Medical History:  Diagnosis Date  . AICD (automatic cardioverter/defibrillator) present   . Anxiety   . Arthritis   . Atherosclerosis of artery of extremity with ulceration ([place]) 09/2019   left foot s/p toe amp requiring debridement and futher toe amputations.  . Atrial fibrillation ([place])   . Cervical spinal stenosis    with neuropathy  . CHF (congestive heart failure) ([place])   . Constipation   . COPD (chronic obstructive pulmonary disease) ([place])   . Coronary artery disease   . Depression   . Dyspnea   . Dysrhythmia    atrial fibrillation  . Emphysema of lung ([place])   . Factor 5 Leiden mutation, heterozygous ([place])    on coumadin  . GERD (gastroesophageal  reflux disease)   . Hypertension   . Lung nodule seen on imaging study    being followed by dr. [name]. just watching it for last few years, without change  . Myocardial infarction [facility]) 2004   stent placed, pacemaker implanted 2005  . Osteomyelitis ([place]) 10/2019   left foot  . Oxygen dependent    requires 2L nasal prong oxygen 24 hours a day  . Peripheral vascular disease ([place])   . Presence of permanent cardiac pacemaker 2005,2018  . Sleep apnea    waiting to have sleep study. Used bipap while hospitalized and said it was great for him.     SURGICAL HISTORY   Past Surgical History:  Procedure Laterality Date  . ABOVE KNEE LEG AMPUTATION Right    after below the knee amputation   . AMPUTATION Left 11/22/2019   Procedure: AMPUTATION BELOW KNEE;  Surgeon: Algernon Huxley, MD;  Location: [facility];  Service: Vascular;  Laterality: Left;  . AMPUTATION TOE Left 08/04/2019   Procedure: AMPUTATION TOE MPJ LEFT;  Surgeon: Samara Deist, DPM;  Location: [facility];  Service: Podiatry;  Laterality: Left;  . AMPUTATION TOE Left 10/06/2019   Procedure: AMPUTATION TOE MPJ T1,T2 LEFT;  Surgeon: Samara Deist, DPM;  Location: [facility];  Service: Podiatry;  Laterality: Left;  . APPLICATION OF WOUND  VAC Left 01/12/2018   Procedure: APPLICATION OF WOUND VAC;  Surgeon: Algernon Huxley, MD;  Location: [facility];  Service: Vascular;  Laterality: Left;  . BELOW KNEE LEG AMPUTATION Right   . CATARACT EXTRACTION, BILATERAL Bilateral   . CORONARY ANGIOPLASTY  2005   stent x 1 placed  . ENDARTERECTOMY FEMORAL Left 08/11/2017   Procedure: ENDARTERECTOMY FEMORAL;  Surgeon: Algernon Huxley, MD;  Location: [facility];  Service: Vascular;  Laterality: Left;  . HEMATOMA EVACUATION Left 01/12/2018   Procedure: EVACUATION HEMATOMA ( DRAINING OF SEROMA);  Surgeon: Algernon Huxley, MD;  Location: [facility];  Service: Vascular;  Laterality: Left;  . IMPLANTABLE CARDIOVERTER DEFIBRILLATOR (ICD) GENERATOR CHANGE Left 02/10/2017   Procedure:  ICD GENERATOR CHANGE;  Surgeon: Isaias Cowman, MD;  Location: [facility];  Service: Cardiovascular;  Laterality: Left;  . INSERT / REPLACE / REMOVE PACEMAKER  [id number], 2018  . LOWER EXTREMITY ANGIOGRAPHY Left 06/07/2017   Procedure: LOWER EXTREMITY ANGIOGRAPHY;  Surgeon: Algernon Huxley, MD;  Location: [place] [facility];  Service: Cardiovascular;  Laterality: Left;  . LOWER EXTREMITY ANGIOGRAPHY Left 10/05/2019   Procedure: LOWER EXTREMITY ANGIOGRAPHY;  Surgeon: Algernon Huxley, MD;  Location: [place] [facility];  Service: Cardiovascular;  Laterality: Left;  . LOWER EXTREMITY INTERVENTION  06/07/2017   Procedure: LOWER EXTREMITY INTERVENTION;  Surgeon: Algernon Huxley, MD;  Location: [place] [facility];  Service: Cardiovascular;;  . PERIPHERAL VASCULAR CATHETERIZATION Left 12/09/2015   Procedure: Lower Extremity Angiography;  Surgeon: Algernon Huxley, MD;  Location: [place] [facility];  Service: Cardiovascular;  Laterality: Left;  . PERIPHERAL VASCULAR CATHETERIZATION  12/09/2015   Procedure: Lower Extremity Intervention;  Surgeon: Algernon Huxley, MD;  Location: [place] [facility];  Service: Cardiovascular;;  . TONSILLECTOMY    . TRANSMETATARSAL AMPUTATION Left 10/20/2019   Procedure: TRANSMETATARSAL AMPUTATION;  Surgeon: Sharlotte Alamo, DPM;  Location: [facility];  Service: Podiatry;  Laterality: Left;     FAMILY HISTORY   Family History  Problem Relation Age of Onset  . Cancer Mother   . Cancer Father   . Heart disease Father      SOCIAL HISTORY   Social History   Tobacco Use  . Smoking status: Former Smoker    Packs/day: 1.00    Years: 42.00    Pack years: 42.00    Types: Cigarettes    Quit date: 09/23/2013    Years since quitting: 6.1  . Smokeless tobacco: Never Used  Vaping Use  . Vaping Use: Never used  Substance Use Topics  . Alcohol use: No    Comment: quit 6 years ago  . Drug use: No     MEDICATIONS    Home Medication:    Current Medication:  Current  Facility-Administered Medications:  .  acetaminophen (TYLENOL) tablet 1,000 mg, 1,000 mg, Oral, Daily PRN, Algernon Huxley, MD, 1,000 mg at 11/24/19 0347 .  albuterol (PROVENTIL) (2.5 MG/3ML) 0.083% nebulizer solution 2.5 mg, 2.5 mg, Nebulization, Q4H PRN, [name], Erskine Squibb, MD .  ALPRAZolam Duanne Moron) tablet 1 mg, 1 mg, Oral, BID PRN, Algernon Huxley, MD, 1 mg at 11/26/19 2329 .  alum & mag hydroxide-simeth (MAALOX/MYLANTA) 200-200-20 MG/5ML suspension 15-30 mL, 15-30 mL, Oral, Q2H PRN, Algernon Huxley, MD, 30 mL at 11/24/19 1432 .  amoxicillin-clavulanate (AUGMENTIN) 875-125 MG per tablet 1 tablet, 1 tablet, Oral, Q12H, [name], Erskine Squibb, MD, 1 tablet at 11/27/19 1021 .  aspirin EC tablet 81 mg, 81 mg, Oral, QPC  supper, Algernon Huxley, MD, 81 mg at 11/26/19 1714 .  bisacodyl (DULCOLAX) EC tablet 5 mg, 5 mg, Oral, Daily PRN, Algernon Huxley, MD .  citalopram (CELEXA) tablet 10 mg, 10 mg, Oral, Daily, [name], Erskine Squibb, MD, 10 mg at 11/27/19 1021 .  docusate sodium (COLACE) capsule 100 mg, 100 mg, Oral, Daily PRN, Algernon Huxley, MD, 100 mg at 11/27/19 1037 .  fluticasone (FLONASE) 50 MCG/ACT nasal spray 2 spray, 2 spray, Each Nare, Daily, [name], Erskine Squibb, MD, 2 spray at 11/27/19 1026 .  furosemide (LASIX) tablet 20 mg, 20 mg, Oral, Daily, [name], Erskine Squibb, MD, 20 mg at 11/27/19 1020 .  gabapentin (NEURONTIN) capsule 300 mg, 300 mg, Oral, BID, [name], Erskine Squibb, MD, 300 mg at 11/27/19 1021 .  guaiFENesin (MUCINEX) 12 hr tablet 600 mg, 600 mg, Oral, QPM, [name], Erskine Squibb, MD, 600 mg at 11/26/19 1714 .  guaiFENesin-dextromethorphan (ROBITUSSIN DM) 100-10 MG/5ML syrup 15 mL, 15 mL, Oral, Q4H PRN, [name], Erskine Squibb, MD .  hydrALAZINE (APRESOLINE) injection 5 mg, 5 mg, Intravenous, Q20 Min PRN, [name], Erskine Squibb, MD .  ipratropium (ATROVENT) 0.03 % nasal spray 2 spray, 2 spray, Each Nare, BID, [name], Erskine Squibb, MD, 2 spray at 11/27/19 1024 .  ipratropium-albuterol (DUONEB) 0.5-2.5 (3) MG/3ML nebulizer solution 3 mL, 3 mL, Inhalation, Q6H, [name], Erskine Squibb, MD, 3 mL at  11/27/19 1434 .  labetalol (NORMODYNE) injection 10 mg, 10 mg, Intravenous, Q10 min PRN, Algernon Huxley, MD .  loratadine (CLARITIN) tablet 10 mg, 10 mg, Oral, QHS, Algernon Huxley, MD, 10 mg at 11/26/19 2113 .  metoprolol succinate (TOPROL-XL) 24 hr tablet 25 mg, 25 mg, Oral, Daily, [name], Erskine Squibb, MD, 25 mg at 11/27/19 1021 .  metoprolol tartrate (LOPRESSOR) injection 2-5 mg, 2-5 mg, Intravenous, Q2H PRN, [name], Erskine Squibb, MD .  mometasone-formoterol (DULERA) 200-5 MCG/ACT inhaler 2 puff, 2 puff, Inhalation, BID, Lucky Cowboy, Erskine Squibb, MD, 2 puff at 11/27/19 1037 .  morphine 2 MG/ML injection 2-5 mg, 2-5 mg, Intravenous, Q1H PRN, Algernon Huxley, MD, 2 mg at 11/27/19 1011 .  nitroGLYCERIN (NITROSTAT) SL tablet 0.4 mg, 0.4 mg, Sublingual, Q5 min PRN, Lucky Cowboy, Erskine Squibb, MD .  ondansetron (ZOFRAN) injection 4 mg, 4 mg, Intravenous, Q6H PRN, Lucky Cowboy, Erskine Squibb, MD .  oxyCODONE (Oxy IR/ROXICODONE) immediate release tablet 5-10 mg, 5-10 mg, Oral, Q4H PRN, Algernon Huxley, MD, 10 mg at 11/27/19 1626 .  pantoprazole (PROTONIX) EC tablet 40 mg, 40 mg, Oral, QAC breakfast, [name], Erskine Squibb, MD, 40 mg at 11/27/19 1021 .  phenol (CHLORASEPTIC) mouth spray 1 spray, 1 spray, Mouth/Throat, PRN, [name], Erskine Squibb, MD .  pravastatin (PRAVACHOL) tablet 10 mg, 10 mg, Oral, q1800, Algernon Huxley, MD, 10 mg at 11/26/19 1714 .  sacubitril-valsartan (ENTRESTO) 24-26 mg per tablet, 1 tablet, Oral, BID, [name], Erskine Squibb, MD, 1 tablet at 11/27/19 1440 .  tamsulosin (FLOMAX) capsule 0.4 mg, 0.4 mg, Oral, QPC supper, Algernon Huxley, MD, 0.4 mg at 11/26/19 1714 .  tiotropium (SPIRIVA) inhalation capsule ([facility] use ONLY) 18 mcg, 1 capsule, Inhalation, Daily, Dallie Piles, RPH, 18 mcg at 11/27/19 1037 .  vitamin B-12 (CYANOCOBALAMIN) tablet 1,000 mcg, 1,000 mcg, Oral, Daily, [name], Erskine Squibb, MD, 1,000 mcg at 11/27/19 1021 .  warfarin (COUMADIN) tablet 5 mg, 5 mg, Oral, q1800, Algernon Huxley, MD, 5 mg at 11/26/19 1714 .  Warfarin - Physician Dosing Inpatient, , Does not apply, q1600,  Algernon Huxley,  MD    ALLERGIES   Apixaban and Rivaroxaban     REVIEW OF SYSTEMS    Review of Systems:  Gen:  Denies  fever, sweats, chills weigh loss  HEENT: Denies blurred vision, double vision, ear pain, eye pain, hearing loss, nose bleeds, sore throat Cardiac:  No dizziness, chest pain or heaviness, chest tightness,edema Resp:   Denies cough or sputum porduction, shortness of breath,wheezing, hemoptysis,  Gi: Denies swallowing difficulty, stomach pain, nausea or vomiting, diarrhea, constipation, bowel incontinence Gu:  Denies bladder incontinence, burning urine Ext:   Denies Joint pain, stiffness or swelling Skin: Denies  skin rash, easy bruising or bleeding or hives Endoc:  Denies polyuria, polydipsia , polyphagia or weight change Psych:   Denies depression, insomnia or hallucinations   Other:  All other systems negative   VS: BP (!) 108/48   Pulse 79   Temp 98.3 F (36.8 C) (Oral)   Resp 16   Ht 6\' 1"  (1.854 m)   Wt 74.8 kg   SpO2 100%   BMI 21.77 kg/m       PHYSICAL EXAM    GENERAL:NAD, no fevers, chills, no weakness no fatigue HEAD: Normocephalic, atraumatic.  EYES: Pupils equal, round, reactive to light. Extraocular muscles intact. No scleral icterus.  MOUTH: Moist mucosal membrane. Dentition intact. No abscess noted.  EAR, NOSE, THROAT: Clear without exudates. No external lesions.  NECK: Supple. No thyromegaly. No nodules. No JVD.  PULMONARY: bilateral rhonchi mild without wheezing or crackles CARDIOVASCULAR: S1 and S2. Regular rate and rhythm. No murmurs, rubs, or gallops. No edema. Pedal pulses 2+ bilaterally.  GASTROINTESTINAL: Soft, nontender, nondistended. No masses. Positive bowel sounds. No hepatosplenomegaly.  MUSCULOSKELETAL: No swelling, clubbing, or edema. Range of motion full in all extremities.  NEUROLOGIC: Cranial nerves II through XII are intact. No gross focal neurological deficits. Sensation intact. Reflexes intact.  SKIN: No  ulceration, lesions, rashes, or cyanosis. Skin warm and dry. Turgor intact.  PSYCHIATRIC: Mood, affect within normal limits. The patient is awake, alert and oriented x 3. Insight, judgment intact.       IMAGING    CT Chest Wo Contrast  Result Date: 11/04/2019 CLINICAL DATA:  Abnormal x-ray, lung nodule seen on previous imaging. EXAM: CT CHEST WITHOUT CONTRAST TECHNIQUE: Multidetector CT imaging of the chest was performed following the standard protocol without IV contrast. COMPARISON:  10/26/2019 FINDINGS: Cardiovascular: Calcified atheromatous plaque of the thoracic aorta. No sign of aneurysm. Three-vessel coronary artery disease. Heart size mildly enlarged. Signs of pacer defibrillator, dual lead with power pack over the LEFT chest as before. Limited assessment of heart and great vessels given lack of intravenous contrast. Small pericardial effusion similar to previous study from June of 2021. Central pulmonary vasculature is of normal caliber. Mediastinum/Nodes: Calcification of LEFT hemi thyroid is dense and unchanged. No axillary lymphadenopathy. No mediastinal lymphadenopathy. Mildly patulous esophagus. No hilar adenopathy. Lungs/Pleura: Signs of pulmonary emphysema both paraseptal and centrilobular with area of nodularity and linear opacity in the RIGHT upper lobe that has been present since at 2015 and reportedly earlier without change. Small nodule along the posterior margin of the RIGHT upper chest 4 mm new since 2019. (Image 56, series 3) airways are patent. No additional nodules. Mild bronchial wall thickening. Secretions noted in the RIGHT lower lobe bronchus. Upper Abdomen: Splenic contours are lobular similar to prior study. The adrenals are unremarkable. Musculoskeletal: No acute musculoskeletal process. Spinal degenerative changes. IMPRESSION: 1. Signs of pulmonary emphysema both paraseptal and centrilobular with area of  nodularity and linear opacity in the RIGHT upper lobe that has been  present since at 2015 and reportedly earlier without change. 2. Small 4 mm nodule along the posterior margin of the RIGHT upper chest new since 2019 unchanged since June of 2020 and stable for 1 years time, likely benign. 3. Material and or secretions in RIGHT lower lobe bronchus. And RIGHT mainstem bronchus, correlate with any risk for aspiration. 4. Small pericardial effusion similar to previous study from June of 2021. 5. Three-vessel coronary artery disease. 6. Emphysema and aortic atherosclerosis. Aortic Atherosclerosis (ICD10-I70.0) and Emphysema (ICD10-J43.9). Electronically Signed   By: Zetta Bills M.D.   On: 11/04/2019 20:07   DG Chest Port 1 View  Result Date: 11/26/2019 CLINICAL DATA:  64 year old male with hypoxia EXAM: PORTABLE CHEST 1 VIEW COMPARISON:  10/25/2019, chest CT 11/03/2019 FINDINGS: Cardiomediastinal silhouette unchanged in size and contour. Coarsened interstitial markings throughout the lungs, not significantly changed from the prior. No interlobular septal thickening. No pneumothorax. No pleural effusion. Unchanged left chest wall cardiac pacing device/AICD. Unremarkable skeletal structures. IMPRESSION: Chronic lung changes without evidence of acute cardiopulmonary disease. Unchanged left chest wall cardiac AICD Electronically Signed   By: Corrie Mckusick D.O.   On: 11/26/2019 13:18      ASSESSMENT/PLAN   Moderate exacerbation of COPD  - evidenced by thickened/discolored and increased volume phelgm production as well as dyspnea at rest, currently [facility]  - will start PO prednisone 50mg  with taper.   -continue Augmentin as ordered for 5-7d course   Bibasilar atelectasis  due to post operative weakness intercurrently with chronic deconditioning.   - MetaNEB therapy with reqruitment maneuvers  - Incentive spirometry     Patient is seen by Dr [name] pulmonary on outpatient and may follow up in pulmonary clinic with him upon d/c.    Thank you for allowing me to  participate in the care of this patient.    Patient/Family are satisfied with care plan and all questions have been answered.   This document was prepared using Dragon voice recognition software and may include unintentional dictation errors.     Ottie Glazier, M.D.  Division of [place]

## 2019-11-27 NOTE — Consult Note (Signed)
CARDIOLOGY CONSULT NOTE               Patient ID: Tyler [name] [name] MRN: [id number] DOB/AGE: Aug 03, 1955 64 y.o.  Admit date: 11/22/2019 Referring Physician Dr. Leotis Pain vascular/ Dr Trula Slade Primary Physician Harrel Lemon Primary Cardiologist Dr. 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  The patient now complains of worsening shortness of breath dyspnea denies any chest pain has had some faint wheezing so cardiology was then consulted for reassessment of potential heart failure  Review of systems complete and found to be negative unless listed above     Past Medical History:  Diagnosis Date  . AICD (automatic cardioverter/defibrillator) present   . Anxiety   . Arthritis   . Atherosclerosis of artery of extremity with ulceration ([place]) 09/2019   left foot s/p toe amp requiring debridement and futher toe amputations.  . Atrial fibrillation ([place])   . Cervical spinal stenosis    with neuropathy  . CHF (congestive heart failure) ([place])   . Constipation   . COPD (chronic obstructive pulmonary disease) ([place])   . Coronary artery disease   . Depression   . Dyspnea   . Dysrhythmia    atrial fibrillation  . Emphysema of lung ([place])   . Factor 5 Leiden mutation, heterozygous ([place])    on coumadin  . GERD (gastroesophageal reflux disease)   . Hypertension   . Lung  nodule seen on imaging study    being followed by dr. [name]. just watching it for last few years, without change  . Myocardial infarction [facility]) 2004   stent placed, pacemaker implanted 2005  . Osteomyelitis ([place]) 10/2019   left foot  . Oxygen dependent    requires 2L nasal prong oxygen 24 hours a day  . Peripheral vascular disease ([place])   . Presence of permanent cardiac pacemaker 2005,2018  . Sleep apnea    waiting to have sleep study. Used bipap while hospitalized and said it was great for him.    Past Surgical History:  Procedure Laterality Date  . ABOVE KNEE LEG AMPUTATION Right    after below the knee amputation   . AMPUTATION Left 11/22/2019   Procedure: AMPUTATION BELOW KNEE;  Surgeon: Algernon Huxley, MD;  Location: [facility];  Service: Vascular;  Laterality: Left;  . AMPUTATION TOE Left 08/04/2019   Procedure: AMPUTATION TOE MPJ LEFT;  Surgeon: Samara Deist, DPM;  Location: [facility];  Service: Podiatry;  Laterality: Left;  . AMPUTATION TOE Left 10/06/2019   Procedure: AMPUTATION TOE MPJ T1,T2 LEFT;  Surgeon: Samara Deist, DPM;  Location: [facility];  Service: Podiatry;  Laterality: Left;  . APPLICATION OF WOUND VAC Left 01/12/2018   Procedure: APPLICATION OF WOUND VAC;  Surgeon: Algernon Huxley, MD;  Location: [facility];  Service: Vascular;  Laterality: Left;  . BELOW KNEE LEG AMPUTATION Right   . CATARACT EXTRACTION, BILATERAL Bilateral   . CORONARY ANGIOPLASTY  2005   stent  x 1 placed  . ENDARTERECTOMY FEMORAL Left 08/11/2017   Procedure: ENDARTERECTOMY FEMORAL;  Surgeon: Algernon Huxley, MD;  Location: [facility];  Service: Vascular;  Laterality: Left;  . HEMATOMA EVACUATION Left 01/12/2018   Procedure: EVACUATION HEMATOMA ( DRAINING OF SEROMA);  Surgeon: Algernon Huxley, MD;  Location: [facility];  Service: Vascular;  Laterality: Left;  . IMPLANTABLE CARDIOVERTER DEFIBRILLATOR (ICD) GENERATOR CHANGE Left 02/10/2017   Procedure: ICD GENERATOR CHANGE;  Surgeon: Isaias Cowman, MD;   Location: [facility];  Service: Cardiovascular;  Laterality: Left;  . INSERT / REPLACE / REMOVE PACEMAKER  [id number], 2018  . LOWER EXTREMITY ANGIOGRAPHY Left 06/07/2017   Procedure: LOWER EXTREMITY ANGIOGRAPHY;  Surgeon: Algernon Huxley, MD;  Location: [place] [facility];  Service: Cardiovascular;  Laterality: Left;  . LOWER EXTREMITY ANGIOGRAPHY Left 10/05/2019   Procedure: LOWER EXTREMITY ANGIOGRAPHY;  Surgeon: Algernon Huxley, MD;  Location: [place] [facility];  Service: Cardiovascular;  Laterality: Left;  . LOWER EXTREMITY INTERVENTION  06/07/2017   Procedure: LOWER EXTREMITY INTERVENTION;  Surgeon: Algernon Huxley, MD;  Location: [place] [facility];  Service: Cardiovascular;;  . PERIPHERAL VASCULAR CATHETERIZATION Left 12/09/2015   Procedure: Lower Extremity Angiography;  Surgeon: Algernon Huxley, MD;  Location: [place] [facility];  Service: Cardiovascular;  Laterality: Left;  . PERIPHERAL VASCULAR CATHETERIZATION  12/09/2015   Procedure: Lower Extremity Intervention;  Surgeon: Algernon Huxley, MD;  Location: [place] [facility];  Service: Cardiovascular;;  . TONSILLECTOMY    . TRANSMETATARSAL AMPUTATION Left 10/20/2019   Procedure: TRANSMETATARSAL AMPUTATION;  Surgeon: Sharlotte Alamo, DPM;  Location: [facility];  Service: Podiatry;  Laterality: Left;    Medications Prior to Admission  Medication Sig Dispense Refill Last Dose  . acetaminophen (TYLENOL) 500 MG tablet Take 1,000 mg by mouth daily as needed for moderate pain or headache.   Past Month at Unknown time  . ALPRAZolam (XANAX) 1 MG tablet Take 1 mg by mouth 2 (two) times daily as needed for anxiety or sleep.    11/22/2019 at Unknown time  . amoxicillin-clavulanate (AUGMENTIN) 875-125 MG tablet Take 1 tablet by mouth every 12 (twelve) hours. 20 tablet 0 11/21/2019 at Unknown time  . aspirin EC 81 MG tablet Take 1 tablet (81 mg total) by mouth daily. (Patient taking differently: Take 81 mg by mouth daily after supper. ) 150 tablet 2 11/21/2019 at Unknown  time  . citalopram (CELEXA) 10 MG tablet Take 10 mg by mouth daily.   11/21/2019 at Unknown time  . ENTRESTO 24-26 MG Take 1 tablet by mouth 2 (two) times daily. 60 tablet 0 11/21/2019 at Unknown time  . fluticasone (FLONASE) 50 MCG/ACT nasal spray Place 2 sprays into both nostrils daily.   11/22/2019 at Unknown time  . fluticasone-salmeterol (ADVAIR HFA) 115-21 MCG/ACT inhaler USE 2 INHALATIONS ORALLY EVERY 12 HOURS (Patient taking differently: Inhale 2 puffs into the lungs every 12 (twelve) hours. ) 36 Inhaler 5 11/22/2019 at Unknown time  . furosemide (LASIX) 20 MG tablet Take 2 tablets (40 mg total) by mouth daily. (Patient taking differently: Take 20 mg by mouth daily. ) 60 tablet 0 11/21/2019 at Unknown time  . gabapentin (NEURONTIN) 100 MG capsule Take 300 mg by mouth in the morning, at noon, and at bedtime.   11/22/2019 at Unknown time  . guaiFENesin (MUCINEX) 600 MG 12 hr tablet Take 600 mg by mouth every evening.    11/21/2019 at Unknown time  . ipratropium (ATROVENT) 0.03 % nasal spray  Place 2 sprays into both nostrils 2 (two) times daily.    11/22/2019 at Unknown time  . Ipratropium-Albuterol (COMBIVENT RESPIMAT) 20-100 MCG/ACT AERS respimat Inhale 1 puff into the lungs every 4 (four) hours.    11/22/2019 at Unknown time  . levalbuterol (XOPENEX) 1.25 MG/3ML nebulizer solution Take 1.25 mg (3 mLs total) by nebulization every 4 (four) hours. (Patient taking differently: Take 3 mLs by nebulization every 4 (four) hours as needed for wheezing or shortness of breath. ) 72 mL 12 11/22/2019 at Unknown time  . loratadine (CLARITIN) 10 MG tablet Take 10 mg by mouth at bedtime.   11/21/2019 at Unknown time  . lovastatin (MEVACOR) 20 MG tablet Take 20 mg by mouth at bedtime.   11/21/2019 at Unknown time  . metoprolol succinate (TOPROL-XL) 25 MG 24 hr tablet Take 25 mg by mouth daily.   11/22/2019 at 0730  . oxyCODONE-acetaminophen (PERCOCET) 7.5-325 MG tablet Take 1 tablet by mouth every 6 (six) hours as needed  for moderate pain.    11/22/2019 at Unknown time  . pantoprazole (PROTONIX) 40 MG tablet Take 40 mg by mouth daily before breakfast.    11/22/2019 at Unknown time  . SPIRIVA RESPIMAT 1.25 MCG/ACT AERS INHALE 2 PUFFS BY MOUTH INTO THE LUNGS DAILY (Patient taking differently: Inhale 2 puffs into the lungs daily. ) 4 g 6 11/22/2019 at Unknown time  . tamsulosin (FLOMAX) 0.4 MG CAPS capsule Take 0.4 mg by mouth daily after supper.   11 11/21/2019 at Unknown time  . vitamin B-12 (CYANOCOBALAMIN) 1000 MCG tablet Take 1,000 mcg by mouth daily.   11/21/2019 at Unknown time  . Wound Dressings (RESTORE WOUND CARE DRESSING) PADS Apply 1 each topically daily.    11/22/2019 at Unknown time  . docusate sodium (COLACE) 100 MG capsule Take 100 mg by mouth daily as needed (for constipation.).    11/20/2019  . nitroGLYCERIN (NITROSTAT) 0.4 MG SL tablet Place 0.4 mg under the tongue every 5 (five) minutes as needed for chest pain.   prn at prn  . Respiratory Therapy Supplies (FLUTTER) DEVI Use as directed. 1 each 0   . warfarin (COUMADIN) 5 MG tablet Take 5 mg by mouth daily.   11/19/2019   Social History   Socioeconomic History  . Marital status: Married    Spouse name: Bethena Roys  . Number of children: Not on file  . Years of education: Not on file  . Highest education level: Not on file  Occupational History  . Occupation: no employment    Comment: disabled  Tobacco Use  . Smoking status: Former Smoker    Packs/day: 1.00    Years: 42.00    Pack years: 42.00    Types: Cigarettes    Quit date: 09/23/2013    Years since quitting: 6.1  . Smokeless tobacco: Never Used  Vaping Use  . Vaping Use: Never used  Substance and Sexual Activity  . Alcohol use: No    Comment: quit 6 years ago  . Drug use: No  . Sexual activity: Yes  Other Topics Concern  . Not on file  Social History Narrative   Patient lives with wife and a dog.   Social Determinants of Health   Financial Resource Strain:   . Difficulty of Paying  Living Expenses:   Food Insecurity:   . Worried About Charity fundraiser in the Last Year:   . Arboriculturist in the Last Year:   Transportation Needs:   .  Lack of Transportation (Medical):   Marland Kitchen Lack of Transportation (Non-Medical):   Physical Activity:   . Days of Exercise per Week:   . Minutes of Exercise per Session:   Stress:   . Feeling of Stress :   Social Connections:   . Frequency of Communication with Friends and Family:   . Frequency of Social Gatherings with Friends and Family:   . Attends Religious Services:   . Active Member of Clubs or Organizations:   . Attends Archivist Meetings:   Marland Kitchen Marital Status:   Intimate Partner Violence:   . Fear of Current or Ex-Partner:   . Emotionally Abused:   Marland Kitchen Physically Abused:   . Sexually Abused:     Family History  Problem Relation Age of Onset  . Cancer Mother   . Cancer Father   . Heart disease Father       Review of systems complete and found to be negative unless listed above      PHYSICAL EXAM  General: Well developed, well nourished, in no acute distress HEENT:  Normocephalic and atramatic Neck:  No JVD.  Lungs: Diminished breath sounds faint wheezing bilaterally to auscultation and percussion. Heart: HRRR . Normal S1 and S2 without gallops or murmurs.  Abdomen: Bowel sounds are positive, abdomen soft and non-tender  Msk:  Back normal, normal gait. Normal strength and tone for age. Extremities: No clubbing, cyanosis or edema.  AKA on the right BKA and the left recent Neuro: Alert and oriented X 3. Psych:  Good affect, responds appropriately  Labs:   Lab Results  Component Value Date   WBC 6.5 11/27/2019   HGB 8.6 (L) 11/27/2019   HCT 25.4 (L) 11/27/2019   MCV 84.4 11/27/2019   PLT 328 11/27/2019    Recent Labs  Lab 11/22/19 1512 11/24/19 0422 11/27/19 0600  NA 128*   < > 130*  K 4.2   < > 4.0  CL 92*   < > 92*  CO2 27   < > 29  BUN 8   < > 7*  CREATININE 0.77   < > 0.56*   CALCIUM 8.7*   < > 8.8*  PROT 6.6  --   --   BILITOT 0.9  --   --   ALKPHOS 67  --   --   ALT 11  --   --   AST 17  --   --   GLUCOSE 196*   < > 91   < > = values in this interval not displayed.   Lab Results  Component Value Date   CKTOTAL 144 06/14/2011   CKMB 2.4 06/14/2011   TROPONINI <0.03 06/22/2017   No results found for: CHOL No results found for: HDL No results found for: LDLCALC No results found for: TRIG No results found for: CHOLHDL No results found for: LDLDIRECT    Radiology: CT Chest Wo Contrast  Result Date: 11/04/2019 CLINICAL DATA:  Abnormal x-ray, lung nodule seen on previous imaging. EXAM: CT CHEST WITHOUT CONTRAST TECHNIQUE: Multidetector CT imaging of the chest was performed following the standard protocol without IV contrast. COMPARISON:  10/26/2019 FINDINGS: Cardiovascular: Calcified atheromatous plaque of the thoracic aorta. No sign of aneurysm. Three-vessel coronary artery disease. Heart size mildly enlarged. Signs of pacer defibrillator, dual lead with power pack over the LEFT chest as before. Limited assessment of heart and great vessels given lack of intravenous contrast. Small pericardial effusion similar to previous study from June of 2021. Central  pulmonary vasculature is of normal caliber. Mediastinum/Nodes: Calcification of LEFT hemi thyroid is dense and unchanged. No axillary lymphadenopathy. No mediastinal lymphadenopathy. Mildly patulous esophagus. No hilar adenopathy. Lungs/Pleura: Signs of pulmonary emphysema both paraseptal and centrilobular with area of nodularity and linear opacity in the RIGHT upper lobe that has been present since at 2015 and reportedly earlier without change. Small nodule along the posterior margin of the RIGHT upper chest 4 mm new since 2019. (Image 56, series 3) airways are patent. No additional nodules. Mild bronchial wall thickening. Secretions noted in the RIGHT lower lobe bronchus. Upper Abdomen: Splenic contours are lobular  similar to prior study. The adrenals are unremarkable. Musculoskeletal: No acute musculoskeletal process. Spinal degenerative changes. IMPRESSION: 1. Signs of pulmonary emphysema both paraseptal and centrilobular with area of nodularity and linear opacity in the RIGHT upper lobe that has been present since at 2015 and reportedly earlier without change. 2. Small 4 mm nodule along the posterior margin of the RIGHT upper chest new since 2019 unchanged since June of 2020 and stable for 1 years time, likely benign. 3. Material and or secretions in RIGHT lower lobe bronchus. And RIGHT mainstem bronchus, correlate with any risk for aspiration. 4. Small pericardial effusion similar to previous study from June of 2021. 5. Three-vessel coronary artery disease. 6. Emphysema and aortic atherosclerosis. Aortic Atherosclerosis (ICD10-I70.0) and Emphysema (ICD10-J43.9). Electronically Signed   By: Zetta Bills M.D.   On: 11/04/2019 20:07   DG Chest Port 1 View  Result Date: 11/26/2019 CLINICAL DATA:  64 year old male with hypoxia EXAM: PORTABLE CHEST 1 VIEW COMPARISON:  10/25/2019, chest CT 11/03/2019 FINDINGS: Cardiomediastinal silhouette unchanged in size and contour. Coarsened interstitial markings throughout the lungs, not significantly changed from the prior. No interlobular septal thickening. No pneumothorax. No pleural effusion. Unchanged left chest wall cardiac pacing device/AICD. Unremarkable skeletal structures. IMPRESSION: Chronic lung changes without evidence of acute cardiopulmonary disease. Unchanged left chest wall cardiac AICD Electronically Signed   By: Corrie Mckusick D.O.   On: 11/26/2019 13:18    EKG: Paced rhythm  ASSESSMENT AND PLAN:  Shortness of breath COPD exacerbation Chronic congestive heart failure Permanent pacemaker Postop BKA Chronic hypoxemia on home O2 Hyperlipidemia Severe peripheral vascular disease . Plan Order BNP for assessment of heart failure Continue heart failure  therapy with Entresto Lasix Metoprolol Agree with IV Lasix to help with mild dyspnea Continue inhalers for COPD Recommend pulmonary input for chronic COPD hypoxemia Recent echo a month ago showed preserved left ventricular function of 55% would not repeat at this point Supplemental oxygen as needed for hypoxemia Recommend conservative cardiac input at this point  Signed: Yolonda Kida MD 11/27/2019, 5:23 PM

## 2019-11-27 NOTE — Progress Notes (Signed)
Occupational Therapy Treatment Patient Details Name: Tyler [name] MRN: [id number] DOB: 1955-10-27 Today's Date: 11/27/2019    History of present illness 64 y.o. 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  Medical history significant of sCHF with EF of 30%, HTN, COPD on 3 L oxygen, BPH, GERD, AICD placement, CAD, PVD, A-fib, factor V Leyden mutation, depression with anxiety, GERD.   OT comments  Pt presents sitting up in recliner upon arrival, agreeable to session, on 2L O2 with SpO2 >98% throughout. He demonstrated knee flexion/extension and ROM/strengthening exercises for maximizing functional use of his LLE. Educated pt in continuing desensitization of BKA site in order to decrease pain and assist with recovery. Pt reported that his chronic COPD makes transfers, specifically to the toilet, difficult to manage. Educated pt on energy conservation strategies, including pursed lip breathing, rest breaks and pacing/planning within his routines. Pt also reported that chronic sensory deficits in both of his hands make it difficult to write his name and use utensils. Provided pt with built up foam handle for utensils and educated him on additional adaptive equipment. Pt completed lateral scoot transfer from recliner into bed with Mod I, demonstrating good UB strength and balance. Pt will continue to benefit from skilled acute OT services to address functional impairments in order to increase pt independence and decrease caregiver burden.       Follow Up Recommendations  Home health OT    Equipment Recommendations  3 in 1 bedside commode (drop arm commode)    Recommendations for Other Services      Precautions / Restrictions Precautions Precautions: Fall Restrictions Weight Bearing Restrictions: Yes RLE Weight Bearing: Non weight bearing LLE Weight Bearing: Non weight bearing Other Position/Activity Restrictions: Pt with hx of  R AKA       Mobility Bed Mobility Overal bed mobility: Modified Independent             General bed mobility comments: Mod I for scooting and repositioning in bed without assistance  Transfers Overall transfer level: Modified independent Equipment used: None             General transfer comment: has sliding board at home, transfers laterally without sliding board this date; SpO2 98% during transfer    Balance Overall balance assessment: Needs assistance Sitting-balance support: No upper extremity supported Sitting balance-Leahy Scale: Good Sitting balance - Comments: steady sitting balance in recliner and long sitting in bed     Standing balance-Leahy Scale: Zero                             ADL either performed or assessed with clinical judgement   ADL Overall ADL's : Needs assistance/impaired                                     Functional mobility during ADLs:  (lateral scoot transfer from recliner to bed) General ADL Comments: Pt able to complete UB ADL with Setup A, though has difficulty with fine motor tasks due to chronic sensory deficits in b/l hands. Pt completes lateral scoot transfer from recliner to bed with Mod I (increased time/effort), anticipate Mod I for toilet t/f to bedside commode.     Vision Baseline Vision/History: No visual deficits Patient Visual Report: No change from baseline     Perception  Praxis      Cognition Arousal/Alertness: Awake/alert Behavior During Therapy: WFL for tasks assessed/performed Overall Cognitive Status: Within Functional Limits for tasks assessed                                 General Comments: Pt pleasant, conversational, eager to participate in therapy session.        Exercises Other Exercises: discussed transfers and home safety/disharge planning Other Exercises: Educated pt re: desensitization of BKA site, safe transfers, energy conservation strategies,  home/routine modifications and knee ROM/strengthening exercises Other Exercises: Provided pt with built up foam handle to address fine motor deficits with eating/writing   Shoulder Instructions       General Comments      Pertinent Vitals/ Pain       Pain Assessment: Faces Faces Pain Scale: Hurts a little bit Pain Location: LLE distal stump Pain Descriptors / Indicators: Aching;Sore Pain Intervention(s): Monitored during session  Home Living                                          Prior Functioning/Environment              Frequency  Min 2X/week        Progress Toward Goals  OT Goals(current goals can now be found in the care plan section)  Progress towards OT goals: Progressing toward goals  Acute Rehab OT Goals Patient Stated Goal: go home and eventually work on walking with prosthetics OT Goal Formulation: With patient Time For Goal Achievement: 12/07/19 Potential to Achieve Goals: Good  Plan Discharge plan remains appropriate;Frequency remains appropriate    Co-evaluation                 AM-PAC OT "6 Clicks" Daily Activity     Outcome Measure   Help from another person eating meals?: None Help from another person taking care of personal grooming?: None Help from another person toileting, which includes using toliet, bedpan, or urinal?: A Little Help from another person bathing (including washing, rinsing, drying)?: A Little Help from another person to put on and taking off regular upper body clothing?: A Little Help from another person to put on and taking off regular lower body clothing?: A Lot 6 Click Score: 19    End of Session    OT Visit Diagnosis: Other abnormalities of gait and mobility (R26.89);Pain;Other symptoms and signs involving the nervous system (R29.898) Pain - Right/Left: Left Pain - part of body: Knee;Leg   Activity Tolerance Patient tolerated treatment well   Patient Left in bed;with call bell/phone  within reach;with bed alarm set;with nursing/sitter in room   Nurse Communication          Time: 4742-5956 OT Time Calculation (min): 32 min  Charges: OT General Charges $OT Visit: 1 Visit OT Treatments $Self Care/Home Management : 8-22 mins $Therapeutic Activity: 8-22 mins  Jerilynn Birkenhead, OTS 11/27/19, 2:57 PM

## 2019-11-27 NOTE — Progress Notes (Addendum)
Subjective  - POD #5  Still does not feel his breathing is back to baseline.   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  There is now some erythema on the flap.  The incision is intact.  The flap is more painful today.       Assessment/Plan:  POD #5  I discussed with the patient that I am concerned about his stump.  The redness is either early cellulitis or hematoma.  He is already on antibiotics.  We will need to continue to monitor this.  He is certainly at risk for requiring a revision.  The patient continues to have shortness of breath.  He was given Lasix and his Delene Loll was restarted yesterday.  His chest x-ray was unremarkable.  I will touch base with cardiology today. This could be a COPD exacerbation, but will also rule out cardiac issues.  We will continue to monitor the patient in the hospital overnight.  Acute blood loss anemia: Hematocrit increased this morning.  Wells Monai Hindes 11/27/2019 10:45 AM --  Vitals:   11/27/19 0418 11/27/19 0807  BP: (!) 103/48   Pulse: 76   Resp: 16   Temp: 97.7 F (36.5 C)   SpO2: 100% 100%    Intake/Output Summary (Last 24 hours) at 11/27/2019 1045 Last data filed at 11/27/2019 1015 Gross per 24 hour  Intake 1680 ml  Output 2300 ml  Net -620 ml     Laboratory CBC    Component Value Date/Time   WBC 6.5 11/27/2019 0600   HGB 8.6 (L) 11/27/2019 0600   HGB 13.2 01/12/2014 1153   HCT 25.4 (L) 11/27/2019 0600   HCT 39.1 (L) 01/12/2014 1153   PLT 328 11/27/2019 0600   PLT 239 01/12/2014 1153    BMET    Component Value Date/Time   NA 130 (L) 11/27/2019 0600   NA 134 (L) 01/12/2014 1153   K 4.0 11/27/2019 0600   K 4.0 01/12/2014 1153   CL 92 (L) 11/27/2019 0600   CL 96 (L) 01/12/2014 1153   CO2 29 11/27/2019 0600   CO2 31 01/12/2014 1153   GLUCOSE 91 11/27/2019 0600   GLUCOSE 97 01/12/2014 1153   BUN 7 (L) 11/27/2019 0600   BUN 7 01/12/2014 1153   CREATININE 0.56 (L) 11/27/2019 0600    CREATININE 0.92 01/12/2014 1153   CALCIUM 8.8 (L) 11/27/2019 0600   CALCIUM 9.2 01/12/2014 1153   GFRNONAA >60 11/27/2019 0600   GFRNONAA >60 01/12/2014 1153   GFRAA >60 11/27/2019 0600   GFRAA >60 01/12/2014 1153    COAG Lab Results  Component Value Date   INR 2.5 (H) 11/27/2019   INR 2.2 (H) 11/26/2019   INR 1.9 (H) 11/25/2019   No results found for: PTT  Antibiotics Anti-infectives (From admission, onward)   Start     Dose/Rate Route Frequency Ordered Stop   11/22/19 1900  amoxicillin-clavulanate (AUGMENTIN) 875-125 MG per tablet 1 tablet     Discontinue     1 tablet Oral Every 12 hours 11/22/19 1505     11/22/19 0932  ceFAZolin (ANCEF) 2-4 GM/100ML-% IVPB       Note to Pharmacy: Sylvester Harder   : cabinet override      11/22/19 0932 11/22/19 1156   11/22/19 0600  ceFAZolin (ANCEF) IVPB 2g/100 mL premix        2 g 200 mL/hr over 30 Minutes Intravenous On call to O.R. 11/22/19 0215 11/22/19 1152  Eldridge Abrahams, M.D., [facility] [place] Office: [phone] Pager:  [phone]

## 2019-11-27 NOTE — Progress Notes (Signed)
Physical Therapy Treatment Patient Details Name: Tyler [name] MRN: [id number] DOB: 12/24/1955 Today's Date: 11/27/2019    History of Present Illness 64 y.o. 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  Medical history significant of sCHF with EF of 30%, HTN, COPD on 3 L oxygen, BPH, GERD, AICD placement, CAD, PVD, A-fib, factor V Leyden mutation, depression with anxiety, GERD.    PT Comments    Pt ready for session.  Good recall and ability to perform HEP.  Lateral scoot transfers to drop arm recliner at bedside with assist to hold chair but no physical assist needed.  Discussed home and assist/equipment.  He seems confident with all equipment needs in place.  Did discuss ways to improve commode transfers here using back on/forward off technique vs a 45 degree angle for ease and safety.  Pt voiced understanding.  He has a drop arm commode at home that works well for him.   Follow Up Recommendations  Home health PT;Supervision/Assistance - 24 hour     Equipment Recommendations  None recommended by PT (pt has all equipment needs met)    Recommendations for Other Services       Precautions / Restrictions Precautions Precautions: Fall Restrictions Weight Bearing Restrictions: Yes RLE Weight Bearing: Non weight bearing LLE Weight Bearing: Non weight bearing Other Position/Activity Restrictions: Pt with hx of R AKA    Mobility  Bed Mobility Overal bed mobility: Modified Independent                Transfers Overall transfer level: Modified independent Equipment used: None             General transfer comment: has sliding board at home.  Stated wife provides stand by assist for transfers.  Ambulation/Gait             General Gait Details: unable   Stairs             Wheelchair Mobility    Modified Rankin (Stroke Patients Only)       Balance Overall balance assessment: Needs  assistance Sitting-balance support: Bilateral upper extremity supported;Single extremity supported Sitting balance-Leahy Scale: Good       Standing balance-Leahy Scale: Zero                              Cognition Arousal/Alertness: Awake/alert Behavior During Therapy: WFL for tasks assessed/performed Overall Cognitive Status: Within Functional Limits for tasks assessed                                 General Comments: Pt pleasant, conversational, eager to participate in therapy session.      Exercises General Exercises - Lower Extremity Quad Sets: Strengthening;10 reps;Left Gluteal Sets: Strengthening;10 reps;Both Short Arc Quad: Strengthening;AROM;Left;15 reps Heel Slides: Strengthening;10 reps;Left (hamstring curls) Hip ABduction/ADduction: Strengthening;10 reps;Sidelying;Left Other Exercises Other Exercises: discussed transfers and home safety/disharge planning    General Comments        Pertinent Vitals/Pain Pain Assessment: Faces Faces Pain Scale: Hurts a little bit Pain Location: LLE distal stump Pain Descriptors / Indicators: Aching;Sore Pain Intervention(s): Limited activity within patient's tolerance;Monitored during session;Repositioned    Home Living                      Prior Function              PT Goals (current goals can now be found in the care plan section) Progress towards PT goals: Progressing toward goals    Frequency    7X/week      PT Plan Current plan remains appropriate    Co-evaluation              AM-PAC PT "6 Clicks" Mobility   Outcome Measure  Help needed turning from your back to your side while in a flat bed without using bedrails?: None Help needed moving from lying on your back to sitting on the side of a flat bed without using bedrails?: None Help needed moving to and from a bed to a chair (including a wheelchair)?: None Help needed standing up from a chair using your arms (e.g.,  wheelchair or bedside chair)?: Total Help needed to walk in hospital room?: Total Help needed climbing 3-5 steps with a railing? : Total 6 Click Score: 15    End of Session   Activity Tolerance: Patient tolerated treatment well;Patient limited by fatigue Patient left: with chair alarm set;with call bell/phone within reach;in chair Nurse Communication: Mobility status PT Visit Diagnosis: Muscle weakness (generalized) (M62.81);Pain;Other abnormalities of gait and mobility (R26.89) Pain - Right/Left: Left Pain - part of body: Knee     Time: 1749-4496 PT Time Calculation (min) (ACUTE ONLY): 13 min  Charges:  $Therapeutic Activity: 8-22 mins                    Chesley Noon, PTA 11/27/19, 1:30 PM

## 2019-11-28 LAB — BASIC METABOLIC PANEL
Anion gap: 6 (ref 5–15)
BUN: 8 mg/dL (ref 8–23)
CO2: 32 mmol/L (ref 22–32)
Calcium: 8.8 mg/dL — ABNORMAL LOW (ref 8.9–10.3)
Chloride: 92 mmol/L — ABNORMAL LOW (ref 98–111)
Creatinine, Ser: 0.65 mg/dL (ref 0.61–1.24)
GFR calc Af Amer: >60 mL/min (ref >60)
GFR calc non Af Amer: >60 mL/min (ref >60)
Glucose, Bld: 97 mg/dL (ref 70–99)
Potassium: 4.1 mmol/L (ref 3.5–5.1)
Sodium: 130 mmol/L — ABNORMAL LOW (ref 135–145)

## 2019-11-28 LAB — PROTIME-INR
INR: 2.5 — ABNORMAL HIGH (ref 0.8–1.2)
Prothrombin Time: 25.8 seconds — ABNORMAL HIGH (ref 11.4–15.2)

## 2019-11-28 LAB — CBC
HCT: 24.3 % — ABNORMAL LOW (ref 39.0–52.0)
Hemoglobin: 8 g/dL — ABNORMAL LOW (ref 13.0–17.0)
MCH: 28.4 pg (ref 26.0–34.0)
MCHC: 32.9 g/dL (ref 30.0–36.0)
MCV: 86.2 fL (ref 80.0–100.0)
Platelets: 320 10*3/uL (ref 150–400)
RBC: 2.82 MIL/uL — ABNORMAL LOW (ref 4.22–5.81)
RDW: 13.4 % (ref 11.5–15.5)
WBC: 5.9 10*3/uL (ref 4.0–10.5)
nRBC: 0 % (ref 0.0–0.2)

## 2019-11-28 LAB — MAGNESIUM: Magnesium: 1.8 mg/dL (ref 1.7–2.4)

## 2019-11-28 MED ORDER — TIOTROPIUM BROMIDE MONOHYDRATE 18 MCG IN CAPS
1.0000 | ORAL_CAPSULE | Freq: Every day | RESPIRATORY_TRACT | Status: DC
  Administered 2019-11-30: 18 ug via RESPIRATORY_TRACT
  Filled 2019-11-28 (×2): qty 5

## 2019-11-28 NOTE — Progress Notes (Signed)
Physical Therapy Treatment Patient Details Name: Tyler [name] MRN: [id number] DOB: 01-09-1956 Today's Date: 11/28/2019    History of Present Illness 64 y.o. 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  Medical history significant of sCHF with EF of 30%, HTN, COPD on 3 L oxygen, BPH, GERD, AICD placement, CAD, PVD, A-fib, factor V Leyden mutation, depression with anxiety, GERD.    PT Comments    Pt was long sitting in bed upon arriving. He is A and O x 4 and pleasant and agreeable to PT session. Only complaint is sacral pain from redness/wound. Reports 2/10 pain. Pt was able to perform amputee exercises with vcs only. No physical assistance. He was re-educated on positioning of LLE and positions to reduce pressure from back side. After performing exercises in bed, pt performed transfer from bed to chair without assist + increased time. Pt ask several times if he is doing well from PT standpoint and therapist reassured him that he was. He has all equipment needs met. Acute PT will continue to follow per POC. Safe to DC home from PT standpoint with continued skilled [facility] PT to follow. At conclusion of session, pt was seated in recliner with pillow under his bottom, call bell in reach, and chair alarm in place.    Follow Up Recommendations  Home health PT     Equipment Recommendations  None recommended by PT (pt has all equipment needs met)    Recommendations for Other Services       Precautions / Restrictions Precautions Precautions: Fall Restrictions Weight Bearing Restrictions: Yes RLE Weight Bearing: Non weight bearing LLE Weight Bearing: Non weight bearing    Mobility  Bed Mobility Overal bed mobility: Modified Independent                Transfers Overall transfer level: Modified independent Equipment used: None             General transfer comment: pt was easily able to exit R side of bed (lateral  scoot) to recliner without assistance. sao2 on 2L > 97% throughout with minimal SOB noted  Ambulation/Gait             General Gait Details: unable to test   Stairs             Wheelchair Mobility    Modified Rankin (Stroke Patients Only)       Balance Overall balance assessment: Needs assistance Sitting-balance support: No upper extremity supported Sitting balance-Leahy Scale: Good Sitting balance - Comments: steady sitting balance in recliner and long sitting in bed       Standing balance comment: unable to trial at this time                            Cognition Arousal/Alertness: Awake/alert Behavior During Therapy: WFL for tasks assessed/performed Overall Cognitive Status: Within Functional Limits for tasks assessed                                 General Comments: Pt pleasant, conversational, eager to participate in therapy session.      Exercises Amputee Exercises Quad Sets: Strengthening;10 reps Gluteal Sets: Strengthening;10 reps Towel Squeeze: Strengthening;10 reps Hip Extension: Strengthening;10 reps Hip ABduction/ADduction: Strengthening;10 reps Straight Leg Raises: Strengthening;10 reps    General Comments  Pertinent Vitals/Pain Pain Assessment: 0-10 Pain Score: 3  Faces Pain Scale: Hurts a little bit Pain Location: butt hurts from sacral wound Pain Descriptors / Indicators: Aching;Sore Pain Intervention(s): Limited activity within patient's tolerance;Monitored during session;Repositioned;Premedicated before session    Home Living                      Prior Function            PT Goals (current goals can now be found in the care plan section) Acute Rehab PT Goals Patient Stated Goal: go home and eventually work on walking with prosthetics Progress towards PT goals: Progressing toward goals    Frequency    7X/week      PT Plan Current plan remains appropriate    Co-evaluation               AM-PAC PT "6 Clicks" Mobility   Outcome Measure  Help needed turning from your back to your side while in a flat bed without using bedrails?: None Help needed moving from lying on your back to sitting on the side of a flat bed without using bedrails?: None Help needed moving to and from a bed to a chair (including a wheelchair)?: None Help needed standing up from a chair using your arms (e.g., wheelchair or bedside chair)?: Total Help needed to walk in hospital room?: Total Help needed climbing 3-5 steps with a railing? : Total 6 Click Score: 15    End of Session   Activity Tolerance: Patient tolerated treatment well;Patient limited by fatigue (SOB with minimal activity) Patient left: in chair;with call bell/phone within reach;with chair alarm set Nurse Communication: Mobility status PT Visit Diagnosis: Muscle weakness (generalized) (M62.81);Pain;Other abnormalities of gait and mobility (R26.89) Pain - Right/Left: Left Pain - part of body: Knee     Time: 1115-1140 PT Time Calculation (min) (ACUTE ONLY): 25 min  Charges:  $Therapeutic Exercise: 8-22 mins $Therapeutic Activity: 8-22 mins                     Julaine Fusi PTA 11/28/19, 12:26 PM

## 2019-11-28 NOTE — Progress Notes (Signed)
Pulmonary Medicine          Date: 11/28/2019,   MRN# [id number] Tyler [name] 07-Jul-1955     AdmissionWeight: 74.8 kg                 CurrentWeight: 74.8 kg   Referring physician: Dr Clayborn Bigness    CHIEF COMPLAINT:   SOB with increased O2 requirment   SUBJECTIVE   Patient reports clinical improvement. He used Metaneb today and feels that breathing has improved.  He still has decreased air entry bilaterally and Id like to continue current care for 1 more day.   Plan is to continue IS, Duoneb, prednisone at current dose 50mg  and finish augmentin.     PAST MEDICAL HISTORY   Past Medical History:  Diagnosis Date  . AICD (automatic cardioverter/defibrillator) present   . Anxiety   . Arthritis   . Atherosclerosis of artery of extremity with ulceration ([place]) 09/2019   left foot s/p toe amp requiring debridement and futher toe amputations.  . Atrial fibrillation ([place])   . Cervical spinal stenosis    with neuropathy  . CHF (congestive heart failure) ([place])   . Constipation   . COPD (chronic obstructive pulmonary disease) ([place])   . Coronary artery disease   . Depression   . Dyspnea   . Dysrhythmia    atrial fibrillation  . Emphysema of lung ([place])   . Factor 5 Leiden mutation, heterozygous ([place])    on coumadin  . GERD (gastroesophageal reflux disease)   . Hypertension   . Lung nodule seen on imaging study    being followed by dr. [name]. just watching it for last few years, without change  . Myocardial infarction [facility]) 2004   stent placed, pacemaker implanted 2005  . Osteomyelitis ([place]) 10/2019   left foot  . Oxygen dependent    requires 2L nasal prong oxygen 24 hours a day  . Peripheral vascular disease ([place])   . Presence of permanent cardiac pacemaker 2005,2018  . Sleep apnea    waiting to have sleep study. Used bipap while hospitalized and said it was great for him.     SURGICAL HISTORY   Past Surgical History:  Procedure Laterality Date  .  ABOVE KNEE LEG AMPUTATION Right    after below the knee amputation   . AMPUTATION Left 11/22/2019   Procedure: AMPUTATION BELOW KNEE;  Surgeon: Algernon Huxley, MD;  Location: [facility];  Service: Vascular;  Laterality: Left;  . AMPUTATION TOE Left 08/04/2019   Procedure: AMPUTATION TOE MPJ LEFT;  Surgeon: Samara Deist, DPM;  Location: [facility];  Service: Podiatry;  Laterality: Left;  . AMPUTATION TOE Left 10/06/2019   Procedure: AMPUTATION TOE MPJ T1,T2 LEFT;  Surgeon: Samara Deist, DPM;  Location: [facility];  Service: Podiatry;  Laterality: Left;  . APPLICATION OF WOUND VAC Left 01/12/2018   Procedure: APPLICATION OF WOUND VAC;  Surgeon: Algernon Huxley, MD;  Location: [facility];  Service: Vascular;  Laterality: Left;  . BELOW KNEE LEG AMPUTATION Right   . CATARACT EXTRACTION, BILATERAL Bilateral   . CORONARY ANGIOPLASTY  2005   stent x 1 placed  . ENDARTERECTOMY FEMORAL Left 08/11/2017   Procedure: ENDARTERECTOMY FEMORAL;  Surgeon: Algernon Huxley, MD;  Location: [facility];  Service: Vascular;  Laterality: Left;  . HEMATOMA EVACUATION Left 01/12/2018   Procedure: EVACUATION HEMATOMA ( DRAINING OF SEROMA);  Surgeon: Algernon Huxley, MD;  Location: [facility];  Service: Vascular;  Laterality: Left;  .  IMPLANTABLE CARDIOVERTER DEFIBRILLATOR (ICD) GENERATOR CHANGE Left 02/10/2017   Procedure: ICD GENERATOR CHANGE;  Surgeon: Isaias Cowman, MD;  Location: [facility];  Service: Cardiovascular;  Laterality: Left;  . INSERT / REPLACE / REMOVE PACEMAKER  0712,1975, 2018  . LOWER EXTREMITY ANGIOGRAPHY Left 06/07/2017   Procedure: LOWER EXTREMITY ANGIOGRAPHY;  Surgeon: Algernon Huxley, MD;  Location: [place] [facility];  Service: Cardiovascular;  Laterality: Left;  . LOWER EXTREMITY ANGIOGRAPHY Left 10/05/2019   Procedure: LOWER EXTREMITY ANGIOGRAPHY;  Surgeon: Algernon Huxley, MD;  Location: [place] [facility];  Service: Cardiovascular;  Laterality: Left;  . LOWER EXTREMITY INTERVENTION  06/07/2017   Procedure: LOWER  EXTREMITY INTERVENTION;  Surgeon: Algernon Huxley, MD;  Location: [place] [facility];  Service: Cardiovascular;;  . PERIPHERAL VASCULAR CATHETERIZATION Left 12/09/2015   Procedure: Lower Extremity Angiography;  Surgeon: Algernon Huxley, MD;  Location: [place] [facility];  Service: Cardiovascular;  Laterality: Left;  . PERIPHERAL VASCULAR CATHETERIZATION  12/09/2015   Procedure: Lower Extremity Intervention;  Surgeon: Algernon Huxley, MD;  Location: [place] [facility];  Service: Cardiovascular;;  . TONSILLECTOMY    . TRANSMETATARSAL AMPUTATION Left 10/20/2019   Procedure: TRANSMETATARSAL AMPUTATION;  Surgeon: Sharlotte Alamo, DPM;  Location: [facility];  Service: Podiatry;  Laterality: Left;     FAMILY HISTORY   Family History  Problem Relation Age of Onset  . Cancer Mother   . Cancer Father   . Heart disease Father      SOCIAL HISTORY   Social History   Tobacco Use  . Smoking status: Former Smoker    Packs/day: 1.00    Years: 42.00    Pack years: 42.00    Types: Cigarettes    Quit date: 09/23/2013    Years since quitting: 6.1  . Smokeless tobacco: Never Used  Vaping Use  . Vaping Use: Never used  Substance Use Topics  . Alcohol use: No    Comment: quit 6 years ago  . Drug use: No     MEDICATIONS    Home Medication:    Current Medication:  Current Facility-Administered Medications:  .  acetaminophen (TYLENOL) tablet 1,000 mg, 1,000 mg, Oral, Daily PRN, Algernon Huxley, MD, 1,000 mg at 11/24/19 0347 .  albuterol (PROVENTIL) (2.5 MG/3ML) 0.083% nebulizer solution 2.5 mg, 2.5 mg, Nebulization, Q4H PRN, [name], Erskine Squibb, MD .  ALPRAZolam Duanne Moron) tablet 1 mg, 1 mg, Oral, BID PRN, Algernon Huxley, MD, 1 mg at 11/27/19 2335 .  alum & mag hydroxide-simeth (MAALOX/MYLANTA) 200-200-20 MG/5ML suspension 15-30 mL, 15-30 mL, Oral, Q2H PRN, Algernon Huxley, MD, 30 mL at 11/24/19 1432 .  amoxicillin-clavulanate (AUGMENTIN) 875-125 MG per tablet 1 tablet, 1 tablet, Oral, Q12H, [name], Erskine Squibb, MD, 1 tablet at  11/28/19 [phone] .  aspirin EC tablet 81 mg, 81 mg, Oral, QPC supper, Algernon Huxley, MD, 81 mg at 11/27/19 1958 .  bisacodyl (DULCOLAX) EC tablet 5 mg, 5 mg, Oral, Daily PRN, Algernon Huxley, MD .  citalopram (CELEXA) tablet 10 mg, 10 mg, Oral, Daily, [name], Erskine Squibb, MD, 10 mg at 11/28/19 0927 .  docusate sodium (COLACE) capsule 100 mg, 100 mg, Oral, Daily PRN, Algernon Huxley, MD, 100 mg at 11/27/19 1037 .  fluticasone (FLONASE) 50 MCG/ACT nasal spray 2 spray, 2 spray, Each Nare, Daily, [name], Erskine Squibb, MD, 2 spray at 11/28/19 0933 .  furosemide (LASIX) tablet 20 mg, 20 mg, Oral, Daily, [name], Erskine Squibb, MD, 20 mg at 11/28/19 0928 .  gabapentin (NEURONTIN) capsule 300 mg, 300 mg, Oral, BID, Lucky Cowboy, Erskine Squibb, MD, 300 mg at 11/28/19 0929 .  guaiFENesin (MUCINEX) 12 hr tablet 600 mg, 600 mg, Oral, QPM, [name], Erskine Squibb, MD, 600 mg at 11/27/19 1958 .  guaiFENesin-dextromethorphan (ROBITUSSIN DM) 100-10 MG/5ML syrup 15 mL, 15 mL, Oral, Q4H PRN, [name], Erskine Squibb, MD .  hydrALAZINE (APRESOLINE) injection 5 mg, 5 mg, Intravenous, Q20 Min PRN, [name], Erskine Squibb, MD .  ipratropium (ATROVENT) 0.03 % nasal spray 2 spray, 2 spray, Each Nare, BID, Algernon Huxley, MD, 2 spray at 11/28/19 0932 .  ipratropium-albuterol (DUONEB) 0.5-2.5 (3) MG/3ML nebulizer solution 3 mL, 3 mL, Inhalation, Q6H, [name], Erskine Squibb, MD, 3 mL at 11/28/19 1334 .  labetalol (NORMODYNE) injection 10 mg, 10 mg, Intravenous, Q10 min PRN, Lucky Cowboy, Erskine Squibb, MD .  loratadine (CLARITIN) tablet 10 mg, 10 mg, Oral, QHS, [name], Erskine Squibb, MD, 10 mg at 11/27/19 2234 .  metoprolol succinate (TOPROL-XL) 24 hr tablet 25 mg, 25 mg, Oral, Daily, [name], Erskine Squibb, MD, 25 mg at 11/27/19 1021 .  metoprolol tartrate (LOPRESSOR) injection 2-5 mg, 2-5 mg, Intravenous, Q2H PRN, [name], Erskine Squibb, MD .  mometasone-formoterol (DULERA) 200-5 MCG/ACT inhaler 2 puff, 2 puff, Inhalation, BID, Lucky Cowboy, Erskine Squibb, MD, 2 puff at 11/28/19 0931 .  morphine 2 MG/ML injection 2-5 mg, 2-5 mg, Intravenous, Q1H PRN, Algernon Huxley, MD, 2 mg  at 11/27/19 2014 .  nitroGLYCERIN (NITROSTAT) SL tablet 0.4 mg, 0.4 mg, Sublingual, Q5 min PRN, Lucky Cowboy, Erskine Squibb, MD .  ondansetron (ZOFRAN) injection 4 mg, 4 mg, Intravenous, Q6H PRN, Lucky Cowboy, Erskine Squibb, MD .  oxyCODONE (Oxy IR/ROXICODONE) immediate release tablet 5-10 mg, 5-10 mg, Oral, Q4H PRN, Algernon Huxley, MD, 10 mg at 11/28/19 1210 .  pantoprazole (PROTONIX) EC tablet 40 mg, 40 mg, Oral, QAC breakfast, [name], Erskine Squibb, MD, 40 mg at 11/28/19 0929 .  phenol (CHLORASEPTIC) mouth spray 1 spray, 1 spray, Mouth/Throat, PRN, [name], Erskine Squibb, MD .  pravastatin (PRAVACHOL) tablet 10 mg, 10 mg, Oral, q1800, Algernon Huxley, MD, 10 mg at 11/27/19 1958 .  predniSONE (DELTASONE) tablet 50 mg, 50 mg, Oral, Q breakfast, Jammie Troup, MD, 50 mg at 11/28/19 0928 .  sacubitril-valsartan (ENTRESTO) 24-26 mg per tablet, 1 tablet, Oral, BID, [name], Erskine Squibb, MD, 1 tablet at 11/27/19 2234 .  tamsulosin (FLOMAX) capsule 0.4 mg, 0.4 mg, Oral, QPC supper, Algernon Huxley, MD, 0.4 mg at 11/27/19 1958 .  [START ON 11/30/2019] tiotropium (SPIRIVA) inhalation capsule ([facility] use ONLY) 18 mcg, 1 capsule, Inhalation, Daily, [name], Erskine Squibb, MD .  vitamin B-12 (CYANOCOBALAMIN) tablet 1,000 mcg, 1,000 mcg, Oral, Daily, Lucky Cowboy, Erskine Squibb, MD, 1,000 mcg at 11/28/19 0929 .  warfarin (COUMADIN) tablet 5 mg, 5 mg, Oral, q1800, Algernon Huxley, MD, 5 mg at 11/27/19 1959 .  Warfarin - Physician Dosing Inpatient, , Does not apply, q74, [name], Erskine Squibb, MD    ALLERGIES   Apixaban and Rivaroxaban     REVIEW OF SYSTEMS    Review of Systems:  Gen:  Denies  fever, sweats, chills weigh loss  HEENT: Denies blurred vision, double vision, ear pain, eye pain, hearing loss, nose bleeds, sore throat Cardiac:  No dizziness, chest pain or heaviness, chest tightness,edema Resp:   Denies cough or sputum porduction, shortness of breath,wheezing, hemoptysis,  Gi: Denies swallowing difficulty, stomach pain, nausea or vomiting, diarrhea, constipation, bowel incontinence Gu:   Denies bladder incontinence, burning urine Ext:   Denies  Joint pain, stiffness or swelling Skin: Denies  skin rash, easy bruising or bleeding or hives Endoc:  Denies polyuria, polydipsia , polyphagia or weight change Psych:   Denies depression, insomnia or hallucinations   Other:  All other systems negative   VS: BP (!) 112/55 (BP Location: Right Arm)   Pulse 74   Temp 97.7 F (36.5 C) (Oral)   Resp 16   Ht 6\' 1"  (1.854 m)   Wt 74.8 kg   SpO2 100%   BMI 21.77 kg/m       PHYSICAL EXAM    GENERAL:NAD, no fevers, chills, no weakness no fatigue HEAD: Normocephalic, atraumatic.  EYES: Pupils equal, round, reactive to light. Extraocular muscles intact. No scleral icterus.  MOUTH: Moist mucosal membrane. Dentition intact. No abscess noted.  EAR, NOSE, THROAT: Clear without exudates. No external lesions.  NECK: Supple. No thyromegaly. No nodules. No JVD.  PULMONARY: bilateral rhonchi mild without wheezing or crackles CARDIOVASCULAR: S1 and S2. Regular rate and rhythm. No murmurs, rubs, or gallops. No edema. Pedal pulses 2+ bilaterally.  GASTROINTESTINAL: Soft, nontender, nondistended. No masses. Positive bowel sounds. No hepatosplenomegaly.  MUSCULOSKELETAL: No swelling, clubbing, or edema. Range of motion full in all extremities.  NEUROLOGIC: Cranial nerves II through XII are intact. No gross focal neurological deficits. Sensation intact. Reflexes intact.  SKIN: No ulceration, lesions, rashes, or cyanosis. Skin warm and dry. Turgor intact.  PSYCHIATRIC: Mood, affect within normal limits. The patient is awake, alert and oriented x 3. Insight, judgment intact.       IMAGING    CT Chest Wo Contrast  Result Date: 11/04/2019 CLINICAL DATA:  Abnormal x-ray, lung nodule seen on previous imaging. EXAM: CT CHEST WITHOUT CONTRAST TECHNIQUE: Multidetector CT imaging of the chest was performed following the standard protocol without IV contrast. COMPARISON:  10/26/2019 FINDINGS:  Cardiovascular: Calcified atheromatous plaque of the thoracic aorta. No sign of aneurysm. Three-vessel coronary artery disease. Heart size mildly enlarged. Signs of pacer defibrillator, dual lead with power pack over the LEFT chest as before. Limited assessment of heart and great vessels given lack of intravenous contrast. Small pericardial effusion similar to previous study from June of 2021. Central pulmonary vasculature is of normal caliber. Mediastinum/Nodes: Calcification of LEFT hemi thyroid is dense and unchanged. No axillary lymphadenopathy. No mediastinal lymphadenopathy. Mildly patulous esophagus. No hilar adenopathy. Lungs/Pleura: Signs of pulmonary emphysema both paraseptal and centrilobular with area of nodularity and linear opacity in the RIGHT upper lobe that has been present since at 2015 and reportedly earlier without change. Small nodule along the posterior margin of the RIGHT upper chest 4 mm new since 2019. (Image 56, series 3) airways are patent. No additional nodules. Mild bronchial wall thickening. Secretions noted in the RIGHT lower lobe bronchus. Upper Abdomen: Splenic contours are lobular similar to prior study. The adrenals are unremarkable. Musculoskeletal: No acute musculoskeletal process. Spinal degenerative changes. IMPRESSION: 1. Signs of pulmonary emphysema both paraseptal and centrilobular with area of nodularity and linear opacity in the RIGHT upper lobe that has been present since at 2015 and reportedly earlier without change. 2. Small 4 mm nodule along the posterior margin of the RIGHT upper chest new since 2019 unchanged since June of 2020 and stable for 1 years time, likely benign. 3. Material and or secretions in RIGHT lower lobe bronchus. And RIGHT mainstem bronchus, correlate with any risk for aspiration. 4. Small pericardial effusion similar to previous study from June of 2021. 5. Three-vessel coronary artery disease. 6. Emphysema and aortic atherosclerosis.  Aortic  Atherosclerosis (ICD10-I70.0) and Emphysema (ICD10-J43.9). Electronically Signed   By: Zetta Bills M.D.   On: 11/04/2019 20:07   DG Chest Port 1 View  Result Date: 11/26/2019 CLINICAL DATA:  64 year old male with hypoxia EXAM: PORTABLE CHEST 1 VIEW COMPARISON:  10/25/2019, chest CT 11/03/2019 FINDINGS: Cardiomediastinal silhouette unchanged in size and contour. Coarsened interstitial markings throughout the lungs, not significantly changed from the prior. No interlobular septal thickening. No pneumothorax. No pleural effusion. Unchanged left chest wall cardiac pacing device/AICD. Unremarkable skeletal structures. IMPRESSION: Chronic lung changes without evidence of acute cardiopulmonary disease. Unchanged left chest wall cardiac AICD Electronically Signed   By: Corrie Mckusick D.O.   On: 11/26/2019 13:18      ASSESSMENT/PLAN   Moderate exacerbation of COPD  - evidenced by thickened/discolored and increased volume phelgm production as well as dyspnea at rest, currently [facility]  - will start PO prednisone 50mg  with taper.   -continue Augmentin as ordered for 5-7d course   Bibasilar atelectasis  due to post operative weakness intercurrently with chronic deconditioning.   - MetaNEB therapy with reqruitment maneuvers  - Incentive spirometry     Patient is seen by Dr [name] pulmonary on outpatient and may follow up in pulmonary clinic with him upon d/c.    Thank you for allowing me to participate in the care of this patient.    Patient/Family are satisfied with care plan and all questions have been answered.   This document was prepared using Dragon voice recognition software and may include unintentional dictation errors.     Ottie Glazier, M.D.  Division of [place]

## 2019-11-28 NOTE — Progress Notes (Signed)
[facility]  Daily Progress Note   Subjective  - 6 Days Post-Op  Patient states he has good pain control.  Objective Vitals:   11/28/19 0207 11/28/19 0456 11/28/19 0928 11/28/19 1129  BP:  (!) 105/56 (!) 105/57 (!) 112/55  Pulse:  73 73 74  Resp:  16  16  Temp:  97.7 F (36.5 C)  97.7 F (36.5 C)  TempSrc:  Oral  Oral  SpO2: 98% 99%  100%  Weight:      Height:        Intake/Output Summary (Last 24 hours) at 11/28/2019 1902 Last data filed at 11/28/2019 1214 Gross per 24 hour  Intake 720 ml  Output 2920 ml  Net -2200 ml    PULM  Normal effort , no use of accessory muscles CV  No JVD, RRR Abd      No distended, nontender VASC  left below-knee amputation is undressed the suture line is intact there is no drainage there is no erythema there is an appropriate amount of edema.  Laboratory CBC    Component Value Date/Time   WBC 5.9 11/28/2019 0455   HGB 8.0 (L) 11/28/2019 0455   HGB 13.2 01/12/2014 1153   HCT 24.3 (L) 11/28/2019 0455   HCT 39.1 (L) 01/12/2014 1153   PLT 320 11/28/2019 0455   PLT 239 01/12/2014 1153    BMET    Component Value Date/Time   NA 130 (L) 11/28/2019 0455   NA 134 (L) 01/12/2014 1153   K 4.1 11/28/2019 0455   K 4.0 01/12/2014 1153   CL 92 (L) 11/28/2019 0455   CL 96 (L) 01/12/2014 1153   CO2 32 11/28/2019 0455   CO2 31 01/12/2014 1153   GLUCOSE 97 11/28/2019 0455   GLUCOSE 97 01/12/2014 1153   BUN 8 11/28/2019 0455   BUN 7 01/12/2014 1153   CREATININE 0.65 11/28/2019 0455   CREATININE 0.92 01/12/2014 1153   CALCIUM 8.8 (L) 11/28/2019 0455   CALCIUM 9.2 01/12/2014 1153   GFRNONAA >60 11/28/2019 0455   GFRNONAA >60 01/12/2014 1153   GFRAA >60 11/28/2019 0455   GFRAA >60 01/12/2014 1153    Assessment/Planning: POD #6 s/p status post left below-knee amputation   Patient appears to be doing well.  His stump actually looks quite good today continue pulmonary work-up daily dressings for the  stump    Hortencia Pilar  11/28/2019, 7:02 PM

## 2019-11-29 LAB — PROTIME-INR
INR: 2.8 — ABNORMAL HIGH (ref 0.8–1.2)
Prothrombin Time: 28.9 seconds — ABNORMAL HIGH (ref 11.4–15.2)

## 2019-11-29 MED ORDER — IPRATROPIUM-ALBUTEROL 0.5-2.5 (3) MG/3ML IN SOLN
3.0000 mL | Freq: Three times a day (TID) | RESPIRATORY_TRACT | Status: DC
  Administered 2019-11-29 – 2019-11-30 (×3): 3 mL via RESPIRATORY_TRACT
  Filled 2019-11-29 (×4): qty 3

## 2019-11-29 NOTE — Progress Notes (Signed)
PT Cancellation Note  Patient Details Name: Tyler [name] MRN: [id number] DOB: 06/30/1955   Cancelled Treatment:     PT attempt. Pt just transferred from bed to recliner. He reports he has been performing there ex and demonstrated several exercises I'ly. Did not want to participate in PT at this time. Pt was educated on what to expect from [facility] [place] and re-educated on safe positioning and breathing techniques. Acute PT will continue to follow per POC and progress as able per pt tolerance. Safe from PT standpoint for safe DC home with HHPT to follow to address strength, endurance, and safe functional mobility.    Willette Pa 11/29/2019, 3:36 PM

## 2019-11-29 NOTE — Progress Notes (Signed)
[facility] Daily Progress Note   Subjective: 11/22/19: Left below-the-knee amputation  Patient without complaint this afternoon.  Pain is well controlled.  Feels his shortness of breath is improving.  Objective: Vitals:   11/29/19 0445 11/29/19 0846 11/29/19 1304 11/29/19 1404  BP: (!) 115/55  (!) 110/46   Pulse: 74 77 71 76  Resp: 18 17 16 16   Temp: 98 F (36.7 C)  97.9 F (36.6 C)   TempSrc: Oral  Oral   SpO2: 100% 97% 93% 93%  Weight:      Height:        Intake/Output Summary (Last 24 hours) at 11/29/2019 1717 Last data filed at 11/29/2019 1500 Gross per 24 hour  Intake --  Output 2650 ml  Net -2650 ml   Physical Exam: A&Ox3, NAD CV: RRR Pulmonary: CTA Bilaterally Abdomen: Soft, Nontender, Nondistended Vascular: Left lower extremity:Stump dressing removed.  Stump is clean dry and intact.  Stump is healthy.  No signs of infection.  Looks well today.   Laboratory: CBC    Component Value Date/Time   WBC 5.9 11/28/2019 0455   HGB 8.0 (L) 11/28/2019 0455   HGB 13.2 01/12/2014 1153   HCT 24.3 (L) 11/28/2019 0455   HCT 39.1 (L) 01/12/2014 1153   PLT 320 11/28/2019 0455   PLT 239 01/12/2014 1153   BMET    Component Value Date/Time   NA 130 (L) 11/28/2019 0455   NA 134 (L) 01/12/2014 1153   K 4.1 11/28/2019 0455   K 4.0 01/12/2014 1153   CL 92 (L) 11/28/2019 0455   CL 96 (L) 01/12/2014 1153   CO2 32 11/28/2019 0455   CO2 31 01/12/2014 1153   GLUCOSE 97 11/28/2019 0455   GLUCOSE 97 01/12/2014 1153   BUN 8 11/28/2019 0455   BUN 7 01/12/2014 1153   CREATININE 0.65 11/28/2019 0455   CREATININE 0.92 01/12/2014 1153   CALCIUM 8.8 (L) 11/28/2019 0455   CALCIUM 9.2 01/12/2014 1153   GFRNONAA >60 11/28/2019 0455   GFRNONAA >60 01/12/2014 1153   GFRAA >60 11/28/2019 0455   GFRAA >60 01/12/2014 1153   Assessment/Planning: The patient is a 64 year old male status post left lower extremity below the knee amputationPOD#7  1)  stump is healthy. 2) continue PT/OT 3) plan for dispo Home with home PT/OT 4) we will await recommendations in regard to when patient can be discharged as per pulmonology  Seen and examined with Eliot Ford PA-C 11/29/2019 5:17 PM

## 2019-11-29 NOTE — Progress Notes (Signed)
Pulmonary Medicine          Date: 11/29/2019,   MRN# [id number] Tyler [name] Apr 18, 1956     AdmissionWeight: 74.8 kg                 CurrentWeight: 74.8 kg   Referring physician: Dr Clayborn Bigness    CHIEF COMPLAINT:   SOB with increased O2 requirment   SUBJECTIVE   Patient using Metane[employer] and reports improvement in breathing. He still has bilateral wheezing but improved. He is on home setting of O2 at this time. Wife at bedside. Plan for d/c home in am and patient may finish augmentin and prednisone with taper x 1 wk.      PAST MEDICAL HISTORY   Past Medical History:  Diagnosis Date  . AICD (automatic cardioverter/defibrillator) present   . Anxiety   . Arthritis   . Atherosclerosis of artery of extremity with ulceration ([place]) 09/2019   left foot s/p toe amp requiring debridement and futher toe amputations.  . Atrial fibrillation ([place])   . Cervical spinal stenosis    with neuropathy  . CHF (congestive heart failure) ([place])   . Constipation   . COPD (chronic obstructive pulmonary disease) ([place])   . Coronary artery disease   . Depression   . Dyspnea   . Dysrhythmia    atrial fibrillation  . Emphysema of lung ([place])   . Factor 5 Leiden mutation, heterozygous ([place])    on coumadin  . GERD (gastroesophageal reflux disease)   . Hypertension   . Lung nodule seen on imaging study    being followed by dr. [name]. just watching it for last few years, without change  . Myocardial infarction [facility]) 2004   stent placed, pacemaker implanted 2005  . Osteomyelitis ([place]) 10/2019   left foot  . Oxygen dependent    requires 2L nasal prong oxygen 24 hours a day  . Peripheral vascular disease ([place])   . Presence of permanent cardiac pacemaker 2005,2018  . Sleep apnea    waiting to have sleep study. Used bipap while hospitalized and said it was great for him.     SURGICAL HISTORY   Past Surgical History:  Procedure Laterality Date  . ABOVE KNEE LEG AMPUTATION Right     after below the knee amputation   . AMPUTATION Left 11/22/2019   Procedure: AMPUTATION BELOW KNEE;  Surgeon: Algernon Huxley, MD;  Location: [facility];  Service: Vascular;  Laterality: Left;  . AMPUTATION TOE Left 08/04/2019   Procedure: AMPUTATION TOE MPJ LEFT;  Surgeon: Samara Deist, DPM;  Location: [facility];  Service: Podiatry;  Laterality: Left;  . AMPUTATION TOE Left 10/06/2019   Procedure: AMPUTATION TOE MPJ T1,T2 LEFT;  Surgeon: Samara Deist, DPM;  Location: [facility];  Service: Podiatry;  Laterality: Left;  . APPLICATION OF WOUND VAC Left 01/12/2018   Procedure: APPLICATION OF WOUND VAC;  Surgeon: Algernon Huxley, MD;  Location: [facility];  Service: Vascular;  Laterality: Left;  . BELOW KNEE LEG AMPUTATION Right   . CATARACT EXTRACTION, BILATERAL Bilateral   . CORONARY ANGIOPLASTY  2005   stent x 1 placed  . ENDARTERECTOMY FEMORAL Left 08/11/2017   Procedure: ENDARTERECTOMY FEMORAL;  Surgeon: Algernon Huxley, MD;  Location: [facility];  Service: Vascular;  Laterality: Left;  . HEMATOMA EVACUATION Left 01/12/2018   Procedure: EVACUATION HEMATOMA ( DRAINING OF SEROMA);  Surgeon: Algernon Huxley, MD;  Location: [facility];  Service: Vascular;  Laterality: Left;  . IMPLANTABLE  CARDIOVERTER DEFIBRILLATOR (ICD) GENERATOR CHANGE Left 02/10/2017   Procedure: ICD GENERATOR CHANGE;  Surgeon: Isaias Cowman, MD;  Location: [facility];  Service: Cardiovascular;  Laterality: Left;  . INSERT / REPLACE / REMOVE PACEMAKER  [id number], 2018  . LOWER EXTREMITY ANGIOGRAPHY Left 06/07/2017   Procedure: LOWER EXTREMITY ANGIOGRAPHY;  Surgeon: Algernon Huxley, MD;  Location: [place] [facility];  Service: Cardiovascular;  Laterality: Left;  . LOWER EXTREMITY ANGIOGRAPHY Left 10/05/2019   Procedure: LOWER EXTREMITY ANGIOGRAPHY;  Surgeon: Algernon Huxley, MD;  Location: [place] [facility];  Service: Cardiovascular;  Laterality: Left;  . LOWER EXTREMITY INTERVENTION  06/07/2017   Procedure: LOWER EXTREMITY INTERVENTION;  Surgeon:  Algernon Huxley, MD;  Location: [place] [facility];  Service: Cardiovascular;;  . PERIPHERAL VASCULAR CATHETERIZATION Left 12/09/2015   Procedure: Lower Extremity Angiography;  Surgeon: Algernon Huxley, MD;  Location: [place] [facility];  Service: Cardiovascular;  Laterality: Left;  . PERIPHERAL VASCULAR CATHETERIZATION  12/09/2015   Procedure: Lower Extremity Intervention;  Surgeon: Algernon Huxley, MD;  Location: [place] [facility];  Service: Cardiovascular;;  . TONSILLECTOMY    . TRANSMETATARSAL AMPUTATION Left 10/20/2019   Procedure: TRANSMETATARSAL AMPUTATION;  Surgeon: Sharlotte Alamo, DPM;  Location: [facility];  Service: Podiatry;  Laterality: Left;     FAMILY HISTORY   Family History  Problem Relation Age of Onset  . Cancer Mother   . Cancer Father   . Heart disease Father      SOCIAL HISTORY   Social History   Tobacco Use  . Smoking status: Former Smoker    Packs/day: 1.00    Years: 42.00    Pack years: 42.00    Types: Cigarettes    Quit date: 09/23/2013    Years since quitting: 6.1  . Smokeless tobacco: Never Used  Vaping Use  . Vaping Use: Never used  Substance Use Topics  . Alcohol use: No    Comment: quit 6 years ago  . Drug use: No     MEDICATIONS    Home Medication:    Current Medication:  Current Facility-Administered Medications:  .  acetaminophen (TYLENOL) tablet 1,000 mg, 1,000 mg, Oral, Daily PRN, Algernon Huxley, MD, 1,000 mg at 11/24/19 0347 .  albuterol (PROVENTIL) (2.5 MG/3ML) 0.083% nebulizer solution 2.5 mg, 2.5 mg, Nebulization, Q4H PRN, [name], Erskine Squibb, MD .  ALPRAZolam Duanne Moron) tablet 1 mg, 1 mg, Oral, BID PRN, Algernon Huxley, MD, 1 mg at 11/29/19 0008 .  alum & mag hydroxide-simeth (MAALOX/MYLANTA) 200-200-20 MG/5ML suspension 15-30 mL, 15-30 mL, Oral, Q2H PRN, Algernon Huxley, MD, 30 mL at 11/24/19 1432 .  aspirin EC tablet 81 mg, 81 mg, Oral, QPC supper, Algernon Huxley, MD, 81 mg at 11/29/19 1827 .  bisacodyl (DULCOLAX) EC tablet 5 mg, 5 mg, Oral, Daily PRN,  Algernon Huxley, MD .  citalopram (CELEXA) tablet 10 mg, 10 mg, Oral, Daily, [name], Erskine Squibb, MD, 10 mg at 11/29/19 1030 .  docusate sodium (COLACE) capsule 100 mg, 100 mg, Oral, Daily PRN, Algernon Huxley, MD, 100 mg at 11/27/19 1037 .  fluticasone (FLONASE) 50 MCG/ACT nasal spray 2 spray, 2 spray, Each Nare, Daily, [name], Erskine Squibb, MD, 2 spray at 11/29/19 1030 .  furosemide (LASIX) tablet 20 mg, 20 mg, Oral, Daily, [name], Erskine Squibb, MD, 20 mg at 11/29/19 1029 .  gabapentin (NEURONTIN) capsule 300 mg, 300 mg, Oral, BID, [name], Erskine Squibb, MD, 300 mg at 11/29/19 1949 .  guaiFENesin ([place])  12 hr tablet 600 mg, 600 mg, Oral, QPM, Algernon Huxley, MD, 600 mg at 11/29/19 1827 .  guaiFENesin-dextromethorphan (ROBITUSSIN DM) 100-10 MG/5ML syrup 15 mL, 15 mL, Oral, Q4H PRN, [name], Erskine Squibb, MD .  hydrALAZINE (APRESOLINE) injection 5 mg, 5 mg, Intravenous, Q20 Min PRN, [name], Erskine Squibb, MD .  ipratropium (ATROVENT) 0.03 % nasal spray 2 spray, 2 spray, Each Nare, BID, Lucky Cowboy, Erskine Squibb, MD, 2 spray at 11/29/19 1034 .  ipratropium-albuterol (DUONEB) 0.5-2.5 (3) MG/3ML nebulizer solution 3 mL, 3 mL, Inhalation, TID, Ottie Glazier, MD, 3 mL at 11/29/19 2036 .  labetalol (NORMODYNE) injection 10 mg, 10 mg, Intravenous, Q10 min PRN, Lucky Cowboy, Erskine Squibb, MD .  loratadine (CLARITIN) tablet 10 mg, 10 mg, Oral, QHS, [name], Erskine Squibb, MD, 10 mg at 11/28/19 2246 .  metoprolol succinate (TOPROL-XL) 24 hr tablet 25 mg, 25 mg, Oral, Daily, [name], Erskine Squibb, MD, 25 mg at 11/29/19 1029 .  metoprolol tartrate (LOPRESSOR) injection 2-5 mg, 2-5 mg, Intravenous, Q2H PRN, [name], Erskine Squibb, MD .  mometasone-formoterol (DULERA) 200-5 MCG/ACT inhaler 2 puff, 2 puff, Inhalation, BID, Lucky Cowboy, Erskine Squibb, MD, 2 puff at 11/29/19 1033 .  morphine 2 MG/ML injection 2-5 mg, 2-5 mg, Intravenous, Q1H PRN, Algernon Huxley, MD, 2 mg at 11/27/19 2014 .  nitroGLYCERIN (NITROSTAT) SL tablet 0.4 mg, 0.4 mg, Sublingual, Q5 min PRN, Lucky Cowboy, Erskine Squibb, MD .  ondansetron (ZOFRAN) injection 4 mg, 4 mg,  Intravenous, Q6H PRN, Lucky Cowboy, Erskine Squibb, MD .  oxyCODONE (Oxy IR/ROXICODONE) immediate release tablet 5-10 mg, 5-10 mg, Oral, Q4H PRN, Algernon Huxley, MD, 10 mg at 11/29/19 1911 .  pantoprazole (PROTONIX) EC tablet 40 mg, 40 mg, Oral, QAC breakfast, [name], Erskine Squibb, MD, 40 mg at 11/29/19 0755 .  phenol (CHLORASEPTIC) mouth spray 1 spray, 1 spray, Mouth/Throat, PRN, [name], Erskine Squibb, MD .  pravastatin (PRAVACHOL) tablet 10 mg, 10 mg, Oral, q1800, Algernon Huxley, MD, 10 mg at 11/29/19 1827 .  predniSONE (DELTASONE) tablet 50 mg, 50 mg, Oral, Q breakfast, Chou Busler, MD, 50 mg at 11/29/19 0755 .  sacubitril-valsartan (ENTRESTO) 24-26 mg per tablet, 1 tablet, Oral, BID, [name], Erskine Squibb, MD, 1 tablet at 11/29/19 1030 .  tamsulosin (FLOMAX) capsule 0.4 mg, 0.4 mg, Oral, QPC supper, Algernon Huxley, MD, 0.4 mg at 11/29/19 1827 .  [START ON 11/30/2019] tiotropium (SPIRIVA) inhalation capsule ([facility] use ONLY) 18 mcg, 1 capsule, Inhalation, Daily, [name], Erskine Squibb, MD .  vitamin B-12 (CYANOCOBALAMIN) tablet 1,000 mcg, 1,000 mcg, Oral, Daily, [name], Erskine Squibb, MD, 1,000 mcg at 11/29/19 1030 .  warfarin (COUMADIN) tablet 5 mg, 5 mg, Oral, q1800, Algernon Huxley, MD, 5 mg at 11/28/19 1725 .  Warfarin - Physician Dosing Inpatient, , Does not apply, q36, [name], Erskine Squibb, MD    ALLERGIES   Apixaban and Rivaroxaban     REVIEW OF SYSTEMS    Review of Systems:  Gen:  Denies  fever, sweats, chills weigh loss  HEENT: Denies blurred vision, double vision, ear pain, eye pain, hearing loss, nose bleeds, sore throat Cardiac:  No dizziness, chest pain or heaviness, chest tightness,edema Resp:   Denies cough or sputum porduction, shortness of breath,wheezing, hemoptysis,  Gi: Denies swallowing difficulty, stomach pain, nausea or vomiting, diarrhea, constipation, bowel incontinence Gu:  Denies bladder incontinence, burning urine Ext:   Denies Joint pain, stiffness or swelling Skin: Denies  skin rash, easy bruising or bleeding or  hives Endoc:  Denies polyuria, polydipsia , polyphagia  or weight change Psych:   Denies depression, insomnia or hallucinations   Other:  All other systems negative   VS: BP 122/64 (BP Location: Left Arm)   Pulse 75   Temp 98.3 F (36.8 C) (Oral)   Resp 18   Ht 6\' 1"  (1.854 m)   Wt 74.8 kg   SpO2 100%   BMI 21.77 kg/m       PHYSICAL EXAM    GENERAL:NAD, no fevers, chills, no weakness no fatigue HEAD: Normocephalic, atraumatic.  EYES: Pupils equal, round, reactive to light. Extraocular muscles intact. No scleral icterus.  MOUTH: Moist mucosal membrane. Dentition intact. No abscess noted.  EAR, NOSE, THROAT: Clear without exudates. No external lesions.  NECK: Supple. No thyromegaly. No nodules. No JVD.  PULMONARY:mild bilaeral wheezing CARDIOVASCULAR: S1 and S2. Regular rate and rhythm. No murmurs, rubs, or gallops. No edema. Pedal pulses 2+ bilaterally.  GASTROINTESTINAL: Soft, nontender, nondistended. No masses. Positive bowel sounds. No hepatosplenomegaly.  MUSCULOSKELETAL: No swelling, clubbing, or edema. Range of motion full in all extremities.  NEUROLOGIC: Cranial nerves II through XII are intact. No gross focal neurological deficits. Sensation intact. Reflexes intact.  SKIN: No ulceration, lesions, rashes, or cyanosis. Skin warm and dry. Turgor intact.  PSYCHIATRIC: Mood, affect within normal limits. The patient is awake, alert and oriented x 3. Insight, judgment intact.       IMAGING    CT Chest Wo Contrast  Result Date: 11/04/2019 CLINICAL DATA:  Abnormal x-ray, lung nodule seen on previous imaging. EXAM: CT CHEST WITHOUT CONTRAST TECHNIQUE: Multidetector CT imaging of the chest was performed following the standard protocol without IV contrast. COMPARISON:  10/26/2019 FINDINGS: Cardiovascular: Calcified atheromatous plaque of the thoracic aorta. No sign of aneurysm. Three-vessel coronary artery disease. Heart size mildly enlarged. Signs of pacer defibrillator, dual  lead with power pack over the LEFT chest as before. Limited assessment of heart and great vessels given lack of intravenous contrast. Small pericardial effusion similar to previous study from June of 2021. Central pulmonary vasculature is of normal caliber. Mediastinum/Nodes: Calcification of LEFT hemi thyroid is dense and unchanged. No axillary lymphadenopathy. No mediastinal lymphadenopathy. Mildly patulous esophagus. No hilar adenopathy. Lungs/Pleura: Signs of pulmonary emphysema both paraseptal and centrilobular with area of nodularity and linear opacity in the RIGHT upper lobe that has been present since at 2015 and reportedly earlier without change. Small nodule along the posterior margin of the RIGHT upper chest 4 mm new since 2019. (Image 56, series 3) airways are patent. No additional nodules. Mild bronchial wall thickening. Secretions noted in the RIGHT lower lobe bronchus. Upper Abdomen: Splenic contours are lobular similar to prior study. The adrenals are unremarkable. Musculoskeletal: No acute musculoskeletal process. Spinal degenerative changes. IMPRESSION: 1. Signs of pulmonary emphysema both paraseptal and centrilobular with area of nodularity and linear opacity in the RIGHT upper lobe that has been present since at 2015 and reportedly earlier without change. 2. Small 4 mm nodule along the posterior margin of the RIGHT upper chest new since 2019 unchanged since June of 2020 and stable for 1 years time, likely benign. 3. Material and or secretions in RIGHT lower lobe bronchus. And RIGHT mainstem bronchus, correlate with any risk for aspiration. 4. Small pericardial effusion similar to previous study from June of 2021. 5. Three-vessel coronary artery disease. 6. Emphysema and aortic atherosclerosis. Aortic Atherosclerosis (ICD10-I70.0) and Emphysema (ICD10-J43.9). Electronically Signed   By: Zetta Bills M.D.   On: 11/04/2019 20:07   DG Chest [facility] 1 View  Result  Date: 11/26/2019 CLINICAL DATA:   63 year old male with hypoxia EXAM: PORTABLE CHEST 1 VIEW COMPARISON:  10/25/2019, chest CT 11/03/2019 FINDINGS: Cardiomediastinal silhouette unchanged in size and contour. Coarsened interstitial markings throughout the lungs, not significantly changed from the prior. No interlobular septal thickening. No pneumothorax. No pleural effusion. Unchanged left chest wall cardiac pacing device/AICD. Unremarkable skeletal structures. IMPRESSION: Chronic lung changes without evidence of acute cardiopulmonary disease. Unchanged left chest wall cardiac AICD Electronically Signed   By: Corrie Mckusick D.O.   On: 11/26/2019 13:18      ASSESSMENT/PLAN   Moderate exacerbation of COPD  - evidenced by thickened/discolored and increased volume phelgm production as well as dyspnea at rest, currently [facility]  - will start PO prednisone 50mg  with taper.   -continue Augmentin 875/125 for -7d course   Bibasilar atelectasis  due to post operative weakness intercurrently with chronic deconditioning.   - MetaNEB therapy with reqruitment maneuvers  - Incentive spirometry         Thank you for allowing me to participate in the care of this patient.    Patient/Family are satisfied with care plan and all questions have been answered.   This document was prepared using Dragon voice recognition software and may include unintentional dictation errors.     Ottie Glazier, M.D.  Division of [place]

## 2019-11-30 LAB — BASIC METABOLIC PANEL
Anion gap: 10 (ref 5–15)
BUN: 8 mg/dL (ref 8–23)
CO2: 29 mmol/L (ref 22–32)
Calcium: 8.8 mg/dL — ABNORMAL LOW (ref 8.9–10.3)
Chloride: 94 mmol/L — ABNORMAL LOW (ref 98–111)
Creatinine, Ser: 0.7 mg/dL (ref 0.61–1.24)
GFR calc Af Amer: >60 mL/min (ref >60)
GFR calc non Af Amer: >60 mL/min (ref >60)
Glucose, Bld: 87 mg/dL (ref 70–99)
Potassium: 3.9 mmol/L (ref 3.5–5.1)
Sodium: 133 mmol/L — ABNORMAL LOW (ref 135–145)

## 2019-11-30 LAB — CBC
HCT: 24.1 % — ABNORMAL LOW (ref 39.0–52.0)
Hemoglobin: 8.1 g/dL — ABNORMAL LOW (ref 13.0–17.0)
MCH: 28.4 pg (ref 26.0–34.0)
MCHC: 33.6 g/dL (ref 30.0–36.0)
MCV: 84.6 fL (ref 80.0–100.0)
Platelets: 407 10*3/uL — ABNORMAL HIGH (ref 150–400)
RBC: 2.85 MIL/uL — ABNORMAL LOW (ref 4.22–5.81)
RDW: 13.7 % (ref 11.5–15.5)
WBC: 7.5 10*3/uL (ref 4.0–10.5)
nRBC: 0 % (ref 0.0–0.2)

## 2019-11-30 LAB — PROTIME-INR
INR: 2.6 — ABNORMAL HIGH (ref 0.8–1.2)
Prothrombin Time: 27.3 seconds — ABNORMAL HIGH (ref 11.4–15.2)

## 2019-11-30 LAB — MAGNESIUM: Magnesium: 1.9 mg/dL (ref 1.7–2.4)

## 2019-11-30 MED ORDER — OXYCODONE-ACETAMINOPHEN 7.5-325 MG PO TABS: 1.0000 | ORAL_TABLET | Freq: Four times a day (QID) | ORAL | 0 refills | Status: DC | PRN

## 2019-11-30 MED ORDER — PREDNISONE 50 MG PO TABS: 50.0000 mg | ORAL_TABLET | Freq: Every day | ORAL | 0 refills | Status: DC

## 2019-11-30 MED ORDER — AMOXICILLIN-POT CLAVULANATE 875-125 MG PO TABS
1.0000 | ORAL_TABLET | Freq: Two times a day (BID) | ORAL | 0 refills | Status: DC
Start: 2019-11-30 — End: 2019-12-20

## 2019-11-30 NOTE — Progress Notes (Signed)
Pulmonary Medicine          Date: 11/30/2019,   MRN# [id number] Tyler [name] 02-22-1956     AdmissionWeight: 74.8 kg                 CurrentWeight: 74.8 kg   Referring physician: Dr Clayborn Bigness    CHIEF COMPLAINT:   SOB with increased O2 requirment   SUBJECTIVE   Patient much improved with less wheezing bilaterally. Plan for d/c today.    PAST MEDICAL HISTORY   Past Medical History:  Diagnosis Date  . AICD (automatic cardioverter/defibrillator) present   . Anxiety   . Arthritis   . Atherosclerosis of artery of extremity with ulceration ([place]) 09/2019   left foot s/p toe amp requiring debridement and futher toe amputations.  . Atrial fibrillation ([place])   . Cervical spinal stenosis    with neuropathy  . CHF (congestive heart failure) ([place])   . Constipation   . COPD (chronic obstructive pulmonary disease) ([place])   . Coronary artery disease   . Depression   . Dyspnea   . Dysrhythmia    atrial fibrillation  . Emphysema of lung ([place])   . Factor 5 Leiden mutation, heterozygous ([place])    on coumadin  . GERD (gastroesophageal reflux disease)   . Hypertension   . Lung nodule seen on imaging study    being followed by dr. [name]. just watching it for last few years, without change  . Myocardial infarction [facility]) 2004   stent placed, pacemaker implanted 2005  . Osteomyelitis ([place]) 10/2019   left foot  . Oxygen dependent    requires 2L nasal prong oxygen 24 hours a day  . Peripheral vascular disease ([place])   . Presence of permanent cardiac pacemaker 2005,2018  . Sleep apnea    waiting to have sleep study. Used bipap while hospitalized and said it was great for him.     SURGICAL HISTORY   Past Surgical History:  Procedure Laterality Date  . ABOVE KNEE LEG AMPUTATION Right    after below the knee amputation   . AMPUTATION Left 11/22/2019   Procedure: AMPUTATION BELOW KNEE;  Surgeon: Algernon Huxley, MD;  Location: [facility];  Service: Vascular;  Laterality:  Left;  . AMPUTATION TOE Left 08/04/2019   Procedure: AMPUTATION TOE MPJ LEFT;  Surgeon: Samara Deist, DPM;  Location: [facility];  Service: Podiatry;  Laterality: Left;  . AMPUTATION TOE Left 10/06/2019   Procedure: AMPUTATION TOE MPJ T1,T2 LEFT;  Surgeon: Samara Deist, DPM;  Location: [facility];  Service: Podiatry;  Laterality: Left;  . APPLICATION OF WOUND VAC Left 01/12/2018   Procedure: APPLICATION OF WOUND VAC;  Surgeon: Algernon Huxley, MD;  Location: [facility];  Service: Vascular;  Laterality: Left;  . BELOW KNEE LEG AMPUTATION Right   . CATARACT EXTRACTION, BILATERAL Bilateral   . CORONARY ANGIOPLASTY  2005   stent x 1 placed  . ENDARTERECTOMY FEMORAL Left 08/11/2017   Procedure: ENDARTERECTOMY FEMORAL;  Surgeon: Algernon Huxley, MD;  Location: [facility];  Service: Vascular;  Laterality: Left;  . HEMATOMA EVACUATION Left 01/12/2018   Procedure: EVACUATION HEMATOMA ( DRAINING OF SEROMA);  Surgeon: Algernon Huxley, MD;  Location: [facility];  Service: Vascular;  Laterality: Left;  . IMPLANTABLE CARDIOVERTER DEFIBRILLATOR (ICD) GENERATOR CHANGE Left 02/10/2017   Procedure: ICD GENERATOR CHANGE;  Surgeon: Isaias Cowman, MD;  Location: [facility];  Service: Cardiovascular;  Laterality: Left;  . INSERT / REPLACE / REMOVE PACEMAKER  [id number], 2018  . LOWER EXTREMITY ANGIOGRAPHY Left 06/07/2017   Procedure: LOWER EXTREMITY ANGIOGRAPHY;  Surgeon: Algernon Huxley, MD;  Location: [place] [facility];  Service: Cardiovascular;  Laterality: Left;  . LOWER EXTREMITY ANGIOGRAPHY Left 10/05/2019   Procedure: LOWER EXTREMITY ANGIOGRAPHY;  Surgeon: Algernon Huxley, MD;  Location: [place] [facility];  Service: Cardiovascular;  Laterality: Left;  . LOWER EXTREMITY INTERVENTION  06/07/2017   Procedure: LOWER EXTREMITY INTERVENTION;  Surgeon: Algernon Huxley, MD;  Location: [place] [facility];  Service: Cardiovascular;;  . PERIPHERAL VASCULAR CATHETERIZATION Left 12/09/2015   Procedure: Lower Extremity Angiography;  Surgeon:  Algernon Huxley, MD;  Location: [place] [facility];  Service: Cardiovascular;  Laterality: Left;  . PERIPHERAL VASCULAR CATHETERIZATION  12/09/2015   Procedure: Lower Extremity Intervention;  Surgeon: Algernon Huxley, MD;  Location: [place] [facility];  Service: Cardiovascular;;  . TONSILLECTOMY    . TRANSMETATARSAL AMPUTATION Left 10/20/2019   Procedure: TRANSMETATARSAL AMPUTATION;  Surgeon: Sharlotte Alamo, DPM;  Location: [facility];  Service: Podiatry;  Laterality: Left;     FAMILY HISTORY   Family History  Problem Relation Age of Onset  . Cancer Mother   . Cancer Father   . Heart disease Father      SOCIAL HISTORY   Social History   Tobacco Use  . Smoking status: Former Smoker    Packs/day: 1.00    Years: 42.00    Pack years: 42.00    Types: Cigarettes    Quit date: 09/23/2013    Years since quitting: 6.1  . Smokeless tobacco: Never Used  Vaping Use  . Vaping Use: Never used  Substance Use Topics  . Alcohol use: No    Comment: quit 6 years ago  . Drug use: No     MEDICATIONS    Home Medication:    Current Medication:  Current Facility-Administered Medications:  .  acetaminophen (TYLENOL) tablet 1,000 mg, 1,000 mg, Oral, Daily PRN, Algernon Huxley, MD, 1,000 mg at 11/24/19 0347 .  albuterol (PROVENTIL) (2.5 MG/3ML) 0.083% nebulizer solution 2.5 mg, 2.5 mg, Nebulization, Q4H PRN, [name], Erskine Squibb, MD .  ALPRAZolam Duanne Moron) tablet 1 mg, 1 mg, Oral, BID PRN, Algernon Huxley, MD, 1 mg at 11/29/19 2346 .  alum & mag hydroxide-simeth (MAALOX/MYLANTA) 200-200-20 MG/5ML suspension 15-30 mL, 15-30 mL, Oral, Q2H PRN, Algernon Huxley, MD, 30 mL at 11/24/19 1432 .  aspirin EC tablet 81 mg, 81 mg, Oral, QPC supper, Algernon Huxley, MD, 81 mg at 11/29/19 1827 .  bisacodyl (DULCOLAX) EC tablet 5 mg, 5 mg, Oral, Daily PRN, Algernon Huxley, MD .  citalopram (CELEXA) tablet 10 mg, 10 mg, Oral, Daily, [name], Erskine Squibb, MD, 10 mg at 11/30/19 0844 .  docusate sodium (COLACE) capsule 100 mg, 100 mg, Oral, Daily PRN,  Algernon Huxley, MD, 100 mg at 11/27/19 1037 .  fluticasone (FLONASE) 50 MCG/ACT nasal spray 2 spray, 2 spray, Each Nare, Daily, [name], Erskine Squibb, MD, 2 spray at 11/30/19 0847 .  furosemide (LASIX) tablet 20 mg, 20 mg, Oral, Daily, [name], Erskine Squibb, MD, 20 mg at 11/30/19 0845 .  gabapentin (NEURONTIN) capsule 300 mg, 300 mg, Oral, BID, Lucky Cowboy, Erskine Squibb, MD, 300 mg at 11/30/19 0844 .  guaiFENesin (MUCINEX) 12 hr tablet 600 mg, 600 mg, Oral, QPM, [name], Erskine Squibb, MD, 600 mg at 11/29/19 1827 .  guaiFENesin-dextromethorphan (ROBITUSSIN DM) 100-10 MG/5ML syrup 15 mL, 15 mL, Oral, Q4H PRN, Lucky Cowboy, Erskine Squibb, MD .  hydrALAZINE (APRESOLINE) injection 5 mg, 5 mg, Intravenous, Q20 Min PRN, [name], Erskine Squibb, MD .  ipratropium (ATROVENT) 0.03 % nasal spray 2 spray, 2 spray, Each Nare, BID, Lucky Cowboy, Erskine Squibb, MD, 2 spray at 11/30/19 0847 .  ipratropium-albuterol (DUONEB) 0.5-2.5 (3) MG/3ML nebulizer solution 3 mL, 3 mL, Inhalation, TID, Ottie Glazier, MD, 3 mL at 11/30/19 0737 .  labetalol (NORMODYNE) injection 10 mg, 10 mg, Intravenous, Q10 min PRN, Lucky Cowboy, Erskine Squibb, MD .  loratadine (CLARITIN) tablet 10 mg, 10 mg, Oral, QHS, [name], Erskine Squibb, MD, 10 mg at 11/29/19 2226 .  metoprolol succinate (TOPROL-XL) 24 hr tablet 25 mg, 25 mg, Oral, Daily, [name], Erskine Squibb, MD, 25 mg at 11/30/19 0844 .  metoprolol tartrate (LOPRESSOR) injection 2-5 mg, 2-5 mg, Intravenous, Q2H PRN, [name], Erskine Squibb, MD .  mometasone-formoterol (DULERA) 200-5 MCG/ACT inhaler 2 puff, 2 puff, Inhalation, BID, Lucky Cowboy, Erskine Squibb, MD, 2 puff at 11/30/19 0848 .  morphine 2 MG/ML injection 2-5 mg, 2-5 mg, Intravenous, Q1H PRN, Algernon Huxley, MD, 2 mg at 11/27/19 2014 .  nitroGLYCERIN (NITROSTAT) SL tablet 0.4 mg, 0.4 mg, Sublingual, Q5 min PRN, Lucky Cowboy, Erskine Squibb, MD .  ondansetron (ZOFRAN) injection 4 mg, 4 mg, Intravenous, Q6H PRN, Lucky Cowboy, Erskine Squibb, MD .  oxyCODONE (Oxy IR/ROXICODONE) immediate release tablet 5-10 mg, 5-10 mg, Oral, Q4H PRN, Algernon Huxley, MD, 10 mg at 11/30/19 0727 .  pantoprazole  (PROTONIX) EC tablet 40 mg, 40 mg, Oral, QAC breakfast, [name], Erskine Squibb, MD, 40 mg at 11/30/19 0845 .  phenol (CHLORASEPTIC) mouth spray 1 spray, 1 spray, Mouth/Throat, PRN, [name], Erskine Squibb, MD .  pravastatin (PRAVACHOL) tablet 10 mg, 10 mg, Oral, q1800, Algernon Huxley, MD, 10 mg at 11/29/19 1827 .  predniSONE (DELTASONE) tablet 50 mg, 50 mg, Oral, Q breakfast, Heiress Williamson, MD, 50 mg at 11/30/19 0845 .  sacubitril-valsartan (ENTRESTO) 24-26 mg per tablet, 1 tablet, Oral, BID, [name], Erskine Squibb, MD, 1 tablet at 11/30/19 0848 .  tamsulosin (FLOMAX) capsule 0.4 mg, 0.4 mg, Oral, QPC supper, Algernon Huxley, MD, 0.4 mg at 11/29/19 1827 .  tiotropium (SPIRIVA) inhalation capsule ([facility] use ONLY) 18 mcg, 1 capsule, Inhalation, Daily, Algernon Huxley, MD, 18 mcg at 11/30/19 0941 .  vitamin B-12 (CYANOCOBALAMIN) tablet 1,000 mcg, 1,000 mcg, Oral, Daily, Algernon Huxley, MD, 1,000 mcg at 11/30/19 0844 .  warfarin (COUMADIN) tablet 5 mg, 5 mg, Oral, q1800, Algernon Huxley, MD, 5 mg at 11/30/19 0846 .  Warfarin - Physician Dosing Inpatient, , Does not apply, q3, [name], Erskine Squibb, MD    ALLERGIES   Apixaban and Rivaroxaban     REVIEW OF SYSTEMS    Review of Systems:  Gen:  Denies  fever, sweats, chills weigh loss  HEENT: Denies blurred vision, double vision, ear pain, eye pain, hearing loss, nose bleeds, sore throat Cardiac:  No dizziness, chest pain or heaviness, chest tightness,edema Resp:   Denies cough or sputum porduction, shortness of breath,wheezing, hemoptysis,  Gi: Denies swallowing difficulty, stomach pain, nausea or vomiting, diarrhea, constipation, bowel incontinence Gu:  Denies bladder incontinence, burning urine Ext:   Denies Joint pain, stiffness or swelling Skin: Denies  skin rash, easy bruising or bleeding or hives Endoc:  Denies polyuria, polydipsia , polyphagia or weight change Psych:   Denies depression, insomnia or hallucinations   Other:  All other systems negative   VS: BP (!) 113/58 (BP  Location: Left Arm)   Pulse 75   Temp 98  F (36.7 C) (Oral)   Resp 18   Ht 6\' 1"  (1.854 m)   Wt 74.8 kg   SpO2 99%   BMI 21.77 kg/m       PHYSICAL EXAM    GENERAL:NAD, no fevers, chills, no weakness no fatigue HEAD: Normocephalic, atraumatic.  EYES: Pupils equal, round, reactive to light. Extraocular muscles intact. No scleral icterus.  MOUTH: Moist mucosal membrane. Dentition intact. No abscess noted.  EAR, NOSE, THROAT: Clear without exudates. No external lesions.  NECK: Supple. No thyromegaly. No nodules. No JVD.  PULMONARY:mild bilaeral wheezing CARDIOVASCULAR: S1 and S2. Regular rate and rhythm. No murmurs, rubs, or gallops. No edema. Pedal pulses 2+ bilaterally.  GASTROINTESTINAL: Soft, nontender, nondistended. No masses. Positive bowel sounds. No hepatosplenomegaly.  MUSCULOSKELETAL: No swelling, clubbing, or edema. Range of motion full in all extremities.  NEUROLOGIC: Cranial nerves II through XII are intact. No gross focal neurological deficits. Sensation intact. Reflexes intact.  SKIN: No ulceration, lesions, rashes, or cyanosis. Skin warm and dry. Turgor intact.  PSYCHIATRIC: Mood, affect within normal limits. The patient is awake, alert and oriented x 3. Insight, judgment intact.       IMAGING    CT Chest Wo Contrast  Result Date: 11/04/2019 CLINICAL DATA:  Abnormal x-ray, lung nodule seen on previous imaging. EXAM: CT CHEST WITHOUT CONTRAST TECHNIQUE: Multidetector CT imaging of the chest was performed following the standard protocol without IV contrast. COMPARISON:  10/26/2019 FINDINGS: Cardiovascular: Calcified atheromatous plaque of the thoracic aorta. No sign of aneurysm. Three-vessel coronary artery disease. Heart size mildly enlarged. Signs of pacer defibrillator, dual lead with power pack over the LEFT chest as before. Limited assessment of heart and great vessels given lack of intravenous contrast. Small pericardial effusion similar to previous study from  June of 2021. Central pulmonary vasculature is of normal caliber. Mediastinum/Nodes: Calcification of LEFT hemi thyroid is dense and unchanged. No axillary lymphadenopathy. No mediastinal lymphadenopathy. Mildly patulous esophagus. No hilar adenopathy. Lungs/Pleura: Signs of pulmonary emphysema both paraseptal and centrilobular with area of nodularity and linear opacity in the RIGHT upper lobe that has been present since at 2015 and reportedly earlier without change. Small nodule along the posterior margin of the RIGHT upper chest 4 mm new since 2019. (Image 56, series 3) airways are patent. No additional nodules. Mild bronchial wall thickening. Secretions noted in the RIGHT lower lobe bronchus. Upper Abdomen: Splenic contours are lobular similar to prior study. The adrenals are unremarkable. Musculoskeletal: No acute musculoskeletal process. Spinal degenerative changes. IMPRESSION: 1. Signs of pulmonary emphysema both paraseptal and centrilobular with area of nodularity and linear opacity in the RIGHT upper lobe that has been present since at 2015 and reportedly earlier without change. 2. Small 4 mm nodule along the posterior margin of the RIGHT upper chest new since 2019 unchanged since June of 2020 and stable for 1 years time, likely benign. 3. Material and or secretions in RIGHT lower lobe bronchus. And RIGHT mainstem bronchus, correlate with any risk for aspiration. 4. Small pericardial effusion similar to previous study from June of 2021. 5. Three-vessel coronary artery disease. 6. Emphysema and aortic atherosclerosis. Aortic Atherosclerosis (ICD10-I70.0) and Emphysema (ICD10-J43.9). Electronically Signed   By: Zetta Bills M.D.   On: 11/04/2019 20:07   DG Chest Port 1 View  Result Date: 11/26/2019 CLINICAL DATA:  64 year old male with hypoxia EXAM: PORTABLE CHEST 1 VIEW COMPARISON:  10/25/2019, chest CT 11/03/2019 FINDINGS: Cardiomediastinal silhouette unchanged in size and contour. Coarsened  interstitial markings throughout the lungs, not  significantly changed from the prior. No interlobular septal thickening. No pneumothorax. No pleural effusion. Unchanged left chest wall cardiac pacing device/AICD. Unremarkable skeletal structures. IMPRESSION: Chronic lung changes without evidence of acute cardiopulmonary disease. Unchanged left chest wall cardiac AICD Electronically Signed   By: Corrie Mckusick D.O.   On: 11/26/2019 13:18      ASSESSMENT/PLAN   Moderate exacerbation of COPD  - evidenced by thickened/discolored and increased volume phelgm production as well as dyspnea at rest, currently [facility]  - will start PO prednisone 50mg  with taper.   -continue Augmentin 875/125 for -7d course   Bibasilar atelectasis  due to post operative weakness intercurrently with chronic deconditioning.   - MetaNEB therapy with reqruitment maneuvers  - Incentive spirometry         Thank you for allowing me to participate in the care of this patient.    Patient/Family are satisfied with care plan and all questions have been answered.   This document was prepared using Dragon voice recognition software and may include unintentional dictation errors.     Ottie Glazier, M.D.  Division of [place]

## 2019-11-30 NOTE — Progress Notes (Signed)
Tyler [name]  A and O x 4. VSS. Pt tolerating diet well. No complaints of pain or nausea. IV removed intact, prescriptions given. Pt voiced understanding of discharge instructions with no further questions. Pt discharged via wheelchair.   Allergies as of 11/30/2019      Reactions   Apixaban Rash   Rivaroxaban Rash      Medication List    TAKE these medications   acetaminophen 500 MG tablet Commonly known as: TYLENOL Take 1,000 mg by mouth daily as needed for moderate pain or headache.   ALPRAZolam 1 MG tablet Commonly known as: XANAX Take 1 mg by mouth 2 (two) times daily as needed for anxiety or sleep.   amoxicillin-clavulanate 875-125 MG tablet Commonly known as: Augmentin Take 1 tablet by mouth every 12 (twelve) hours.   aspirin EC 81 MG tablet Take 1 tablet (81 mg total) by mouth daily. What changed: when to take this   citalopram 10 MG tablet Commonly known as: CELEXA Take 10 mg by mouth daily.   Combivent Respimat 20-100 MCG/ACT Aers respimat Generic drug: Ipratropium-Albuterol Inhale 1 puff into the lungs every 4 (four) hours.   docusate sodium 100 MG capsule Commonly known as: COLACE Take 100 mg by mouth daily as needed (for constipation.).   Entresto 24-26 MG Generic drug: sacubitril-valsartan Take 1 tablet by mouth 2 (two) times daily.   fluticasone 50 MCG/ACT nasal spray Commonly known as: FLONASE Place 2 sprays into both nostrils daily.   fluticasone-salmeterol 115-21 MCG/ACT inhaler Commonly known as: Advair HFA USE 2 INHALATIONS ORALLY EVERY 12 HOURS What changed:   how much to take  how to take this  when to take this  additional instructions   Flutter Devi Use as directed.   furosemide 20 MG tablet Commonly known as: LASIX Take 2 tablets (40 mg total) by mouth daily. What changed: how much to take   gabapentin 100 MG capsule Commonly known as: NEURONTIN Take 300 mg by mouth in the morning, at noon, and at bedtime.    guaiFENesin 600 MG 12 hr tablet Commonly known as: MUCINEX Take 600 mg by mouth every evening.   ipratropium 0.03 % nasal spray Commonly known as: ATROVENT Place 2 sprays into both nostrils 2 (two) times daily.   levalbuterol 1.25 MG/3ML nebulizer solution Commonly known as: XOPENEX Take 1.25 mg (3 mLs total) by nebulization every 4 (four) hours. What changed:   when to take this  reasons to take this   loratadine 10 MG tablet Commonly known as: CLARITIN Take 10 mg by mouth at bedtime.   lovastatin 20 MG tablet Commonly known as: MEVACOR Take 20 mg by mouth at bedtime.   metoprolol succinate 25 MG 24 hr tablet Commonly known as: TOPROL-XL Take 25 mg by mouth daily.   nitroGLYCERIN 0.4 MG SL tablet Commonly known as: NITROSTAT Place 0.4 mg under the tongue every 5 (five) minutes as needed for chest pain.   oxyCODONE-acetaminophen 7.5-325 MG tablet Commonly known as: PERCOCET Take 1-2 tablets by mouth every 6 (six) hours as needed for moderate pain. What changed: how much to take   pantoprazole 40 MG tablet Commonly known as: PROTONIX Take 40 mg by mouth daily before breakfast.   predniSONE 50 MG tablet Commonly known as: DELTASONE Take 1 tablet (50 mg total) by mouth daily with breakfast. Start taking on: December 01, 2019   Restore Wound Care Dressing Pads Apply 1 each topically daily.   Spiriva Respimat 1.25 MCG/ACT Aers Generic drug:  Tiotropium Bromide Monohydrate INHALE 2 PUFFS BY MOUTH INTO THE LUNGS DAILY What changed: See the new instructions.   tamsulosin 0.4 MG Caps capsule Commonly known as: FLOMAX Take 0.4 mg by mouth daily after supper.   vitamin B-12 1000 MCG tablet Commonly known as: CYANOCOBALAMIN Take 1,000 mcg by mouth daily.   warfarin 5 MG tablet Commonly known as: COUMADIN Take 5 mg by mouth daily.            Durable Medical Equipment  (From admission, onward)         Start     Ordered   11/24/19 1154  For home use only DME  Bedside commode  Once       Comments: Drop Arm  Question:  Patient needs a bedside commode to treat with the following condition  Answer:  PAD (peripheral artery disease) ([place])   11/24/19 1153          Vitals:   11/30/19 0737 11/30/19 1155  BP:  (!) 134/107  Pulse: 75 74  Resp: 18 18  Temp:  97.8 F (36.6 C)  SpO2: 99% 100%    Francesco Sor

## 2019-11-30 NOTE — Discharge Summary (Signed)
[facility]    Discharge Summary  Patient ID:  Tyler [name] MRN: [id number] DOB/AGE: 10-10-1955 64 y.o.  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 AICD (automatic cardioverter/defibrillator) present   . Anxiety   . Arthritis   . Atherosclerosis of artery of extremity with ulceration ([place]) 09/2019   left foot s/p toe amp requiring debridement and futher toe amputations.  . Atrial fibrillation ([place])   . Cervical spinal stenosis    with neuropathy  . CHF (congestive heart failure) ([place])   . Constipation   . COPD (chronic obstructive pulmonary disease) ([place])   . Coronary artery disease   . Depression   . Dyspnea   . Dysrhythmia    atrial fibrillation  . Emphysema of lung ([place])   . Factor 5 Leiden mutation, heterozygous ([place])    on coumadin  . GERD (gastroesophageal reflux disease)   . Hypertension   . Lung nodule seen on imaging study    being followed by dr. [name]. just watching it for last few years, without change  . Myocardial infarction [facility] 2004   stent placed, pacemaker implanted 2005  . Osteomyelitis ([place]) 10/2019   left foot  . Oxygen dependent    requires 2L nasal prong oxygen 24 hours a day  . Peripheral vascular disease ([place])   . Presence of permanent cardiac pacemaker 2005,2018  . Sleep apnea    waiting to have sleep study. Used bipap while hospitalized and said it was great for him.   Procedure(s): 11/22/19: Left below-the-knee amputation  Discharged Condition: Good  HPI / Hospital Course:  Tyler [name] is a 64 y.o. male who presents with left leg gangrene. The patient is scheduled for a left  below-the-knee amputation.  I discussed in depth with the patient the risks, benefits, and alternatives to this procedure.  The patient is aware that the risk of this operation included but are not limited to:  bleeding, infection, myocardial infarction, stroke, death, failure to heal amputation wound, and possible need for more proximal amputation.  The patient is aware of the risks and agrees proceed forward with the procedure.  Patient was started on prednisone and Augmentin.  Patient also received nebulizers and used incentive spirometry.  On November 22, 2019 the patient underwent:  Left below-the-knee amputation  He tolerated procedure well and was transferred to the surgical floor without issue.  Night of surgery was unremarkable.  During the patient's inpatient stay, and he experienced an exacerbation of his COPD.  Patient received a cardiology consult who felt cardiac intervention Warranted at this time.  Patient also received pulmonary consult who follow the patient closely during his inpatient stay.   Patient also received occupational and physical therapy during his inpatient stay.  Patient was deemed appropriate to be discharged home with home PT/OT.  During the patient's brief inpatient stay, his diet was advanced, his Foley was removed, his pain was controlled the use of p.o. pain medication he was ambulating less close to baseline as possible given his recent surgery.  Patient was discharged when his COPD had return to his baseline.  Patient to follow-up with pulmonology in 1 week. Patient to follow-up in our office in 2 weeks  Extubated: POD #  0  Physical exam:  A&Ox3, NAD CV: RRR Pulmonary: CTA Bilaterally Abdomen: Soft, Nontender, Nondistended Vascular: Left lower extremity:Stump dressing removed.  Stump is clean dry and intact.  Stump is healthy.  No signs of infection.  Looks well today.  Labs: As below  Complications: None  Consults:  Treatment Team:   Ottie Glazier, MD  Significant Diagnostic Studies: CBC Lab Results  Component Value Date   WBC 7.5 11/30/2019   HGB 8.1 (L) 11/30/2019   HCT 24.1 (L) 11/30/2019   MCV 84.6 11/30/2019   PLT 407 (H) 11/30/2019   BMET    Component Value Date/Time   NA 133 (L) 11/30/2019 0412   NA 134 (L) 01/12/2014 1153   K 3.9 11/30/2019 0412   K 4.0 01/12/2014 1153   CL 94 (L) 11/30/2019 0412   CL 96 (L) 01/12/2014 1153   CO2 29 11/30/2019 0412   CO2 31 01/12/2014 1153   GLUCOSE 87 11/30/2019 0412   GLUCOSE 97 01/12/2014 1153   BUN 8 11/30/2019 0412   BUN 7 01/12/2014 1153   CREATININE 0.70 11/30/2019 0412   CREATININE 0.92 01/12/2014 1153   CALCIUM 8.8 (L) 11/30/2019 0412   CALCIUM 9.2 01/12/2014 1153   GFRNONAA >60 11/30/2019 0412   GFRNONAA >60 01/12/2014 1153   GFRAA >60 11/30/2019 0412   GFRAA >60 01/12/2014 1153   COAG Lab Results  Component Value Date   INR 2.6 (H) 11/30/2019   INR 2.8 (H) 11/29/2019   INR 2.5 (H) 11/28/2019   Disposition:  Discharge to :Home  Allergies as of 11/30/2019      Reactions   Apixaban Rash   Rivaroxaban Rash      Medication List    TAKE these medications   acetaminophen 500 MG tablet Commonly known as: TYLENOL Take 1,000 mg by mouth daily as needed for moderate pain or headache.   ALPRAZolam 1 MG tablet Commonly known as: XANAX Take 1 mg by mouth 2 (two) times daily as needed for anxiety or sleep.   amoxicillin-clavulanate 875-125 MG tablet Commonly known as: Augmentin Take 1 tablet by mouth every 12 (twelve) hours.   aspirin EC 81 MG tablet Take 1 tablet (81 mg total) by mouth daily. What changed: when to take this   citalopram 10 MG tablet Commonly known as: CELEXA Take 10 mg by mouth daily.   Combivent Respimat 20-100 MCG/ACT Aers respimat Generic drug: Ipratropium-Albuterol Inhale 1 puff into the lungs every 4 (four) hours.   docusate sodium 100 MG capsule Commonly known as: COLACE Take 100 mg by mouth daily as  needed (for constipation.).   Entresto 24-26 MG Generic drug: sacubitril-valsartan Take 1 tablet by mouth 2 (two) times daily.   fluticasone 50 MCG/ACT nasal spray Commonly known as: FLONASE Place 2 sprays into both nostrils daily.   fluticasone-salmeterol 115-21 MCG/ACT inhaler Commonly known as: Advair HFA USE 2 INHALATIONS ORALLY EVERY 12 HOURS What changed:   how much to take  how to take this  when to take this  additional instructions   Flutter Devi Use as directed.   furosemide 20 MG tablet Commonly known as: LASIX Take 2 tablets (40 mg total) by mouth daily. What changed: how much to take   gabapentin 100 MG capsule Commonly known as: NEURONTIN Take 300 mg by mouth in the morning, at noon, and at bedtime.   guaiFENesin 600 MG 12 hr tablet Commonly known as: MUCINEX Take 600 mg by mouth every evening.   ipratropium 0.03 % nasal  spray Commonly known as: ATROVENT Place 2 sprays into both nostrils 2 (two) times daily.   levalbuterol 1.25 MG/3ML nebulizer solution Commonly known as: XOPENEX Take 1.25 mg (3 mLs total) by nebulization every 4 (four) hours. What changed:   when to take this  reasons to take this   loratadine 10 MG tablet Commonly known as: CLARITIN Take 10 mg by mouth at bedtime.   lovastatin 20 MG tablet Commonly known as: MEVACOR Take 20 mg by mouth at bedtime.   metoprolol succinate 25 MG 24 hr tablet Commonly known as: TOPROL-XL Take 25 mg by mouth daily.   nitroGLYCERIN 0.4 MG SL tablet Commonly known as: NITROSTAT Place 0.4 mg under the tongue every 5 (five) minutes as needed for chest pain.   oxyCODONE-acetaminophen 7.5-325 MG tablet Commonly known as: PERCOCET Take 1-2 tablets by mouth every 6 (six) hours as needed for moderate pain. What changed: how much to take   pantoprazole 40 MG tablet Commonly known as: PROTONIX Take 40 mg by mouth daily before breakfast.   predniSONE 50 MG tablet Commonly known as:  DELTASONE Take 1 tablet (50 mg total) by mouth daily with breakfast. Start taking on: December 01, 2019   Restore Wound Care Dressing Pads Apply 1 each topically daily.   Spiriva Respimat 1.25 MCG/ACT Aers Generic drug: Tiotropium Bromide Monohydrate INHALE 2 PUFFS BY MOUTH INTO THE LUNGS DAILY What changed: See the new instructions.   tamsulosin 0.4 MG Caps capsule Commonly known as: FLOMAX Take 0.4 mg by mouth daily after supper.   vitamin B-12 1000 MCG tablet Commonly known as: CYANOCOBALAMIN Take 1,000 mcg by mouth daily.   warfarin 5 MG tablet Commonly known as: COUMADIN Take 5 mg by mouth daily.            Durable Medical Equipment  (From admission, onward)         Start     Ordered   11/24/19 1154  For home use only DME Bedside commode  Once       Comments: Drop Arm  Question:  Patient needs a bedside commode to treat with the following condition  Answer:  PAD (peripheral artery disease) ([place])   11/24/19 1153         Verbal and written Discharge instructions given to the patient. Wound care per Discharge AVS  Follow-up Information    Kris Hartmann, NP. Schedule an appointment as soon as possible for a visit in 2 weeks.   Specialty: Vascular Surgery Why: First post-op visit. left meassage for them to call the patient Contact information: [place] [address] [phone]        Ottie Glazier, MD. Go on 12/11/2019.   Specialty: Pulmonary Disease Why: Pulmonary / COPD follow up. 2:30pm appointment Contact information: [place] [address] [address], [facility] Follow up.   Specialty: Home Health Services Why: They will follow up with you for your home health needs. Contact information: [address] [place] [address] [id number]              Signed: Sela Hua, PA-C  11/30/2019, 10:58 AM

## 2019-11-30 NOTE — TOC Transition Note (Signed)
Transition of Care [facility]) - CM/SW Discharge Note   Patient Details  Name: Tyler [name] MRN: [id number] Date of Birth: [birth year]  Transition of Care [facility]) CM/SW Contact:  Candie Chroman, LCSW Phone Number: 11/30/2019, 9:18 AM   Clinical Narrative:  Patient has orders to discharge home today. [facility] representative is aware. [place] representative said that his drop arm bedside commode was delivered 7/2. No further concerns. CSW signing off.  Final next level of care: [place] Barriers to Discharge: Barriers Resolved   Patient Goals and CMS Choice     Choice offered to / list presented to : NA  Discharge Placement                    Patient and family notified of of transfer: 11/30/19  Discharge Plan and Services     Post Acute Care Choice: [place], Durable Medical Equipment          DME Arranged: 3-N-1 (with drop arms) DME Agency: [employer] Date DME Agency Contacted: 11/23/19   Representative spoke with at DME Agency: Andree Coss HH Arranged: RN, PT, OT [place] Agency: [facility] [place] Date [place]: 11/30/19   Representative spoke with at [place]: [place]) Interventions     Readmission Risk Interventions Readmission Risk Prevention Plan 11/23/2019  Transportation Screening Complete  PCP or Specialist Appt within 3-5 Days Complete  HRI or [place] Complete  Social Work Consult for [place] Planning/Counseling Complete  Palliative Care Screening Complete  Medication Review Press photographer) Complete  Some recent data might be hidden

## 2019-11-30 NOTE — Discharge Instructions (Addendum)
Vascular Surgery Discharge Instructions:  1) change stump dressing daily.  Wrap with Kerlix and cover with Ace bandage. 2) please make sure you are keeping your knee flexible. 3) Please follow up with who monitors your INR

## 2019-11-30 NOTE — Care Management Important Message (Signed)
Important Message  Patient Details  Name: Tyler [name] MRN: [id number] Date of Birth: 1956-02-26   Medicare Important Message Given:  No  Discharged prior to arrival to unit to deliver concurrent Medicare IM.    Dannette Barbara 11/30/2019, 1:15 PM

## 2019-12-01 ENCOUNTER — Other Ambulatory Visit: Payer: Self-pay | Admitting: *Deleted

## 2019-12-01 ENCOUNTER — Encounter (INDEPENDENT_AMBULATORY_CARE_PROVIDER_SITE_OTHER): Payer: Self-pay

## 2019-12-01 ENCOUNTER — Other Ambulatory Visit: Payer: Self-pay

## 2019-12-01 NOTE — Patient Outreach (Addendum)
[facility]) Care Management  [place]  12/01/2019   Tyler [name] 20-Aug-1955 [id number]  Subjective: Telephone call to patient spoke wth wife Bethena Roys. She states patient is doing ok but they had an accident this morning with transferring from chair to new BSC.  She states it was a different transfer for them and that patient did land on the floor. She states he is ok and no injury to surgical site.  Discussed fall precautions and that home health will help with educating on transfers. She states that patient is depends on her totally for care. She states that she provided all care prior to hospitalization but just a challenge with patient being double amputee now.  Discussed surgical site and signs of infection and notifying physician for any changes. She verbalized understanding.  Patient was active with [facility] prior to hospitalization so they are familiar with them and will call if nurse does not call.  Patient has appointment with Dr. Bunnie Domino office on 12-14-19 for follow up. She will transport to appointment and states that patient has a transfer board for the car that they have always used. Discussed transfer safety.  She verbalized understanding and voices no concerns.   Discussed [facility] services and support.  Wife agreeable to nurse follow up post-hospitalization.       Mr. Tift has medical history significant of sCHF with EF of 30%, hypertension, COPD on 3 L oxygen, BPH, GERD, AICD placement, CAD, PVD, atrial fibrillation on Coumadin, factor V Leyden mutation, depression with anxiety, GERD, Right AKA and recent Left BKA.  Objective:   Encounter Medications:  Outpatient Encounter Medications as of 12/01/2019  Medication Sig Note  . acetaminophen (TYLENOL) 500 MG tablet Take 1,000 mg by mouth daily as needed for moderate pain or headache.   . ALPRAZolam (XANAX) 1 MG tablet Take 1 mg by mouth 2 (two) times daily as needed for anxiety or sleep.    Marland Kitchen  amoxicillin-clavulanate (AUGMENTIN) 875-125 MG tablet Take 1 tablet by mouth every 12 (twelve) hours.   Marland Kitchen aspirin EC 81 MG tablet Take 1 tablet (81 mg total) by mouth daily. (Patient taking differently: Take 81 mg by mouth daily after supper. )   . citalopram (CELEXA) 10 MG tablet Take 10 mg by mouth daily.   Marland Kitchen docusate sodium (COLACE) 100 MG capsule Take 100 mg by mouth daily as needed (for constipation.).    Marland Kitchen ENTRESTO 24-26 MG Take 1 tablet by mouth 2 (two) times daily.   . fluticasone (FLONASE) 50 MCG/ACT nasal spray Place 2 sprays into both nostrils daily.   . fluticasone-salmeterol (ADVAIR HFA) 115-21 MCG/ACT inhaler USE 2 INHALATIONS ORALLY EVERY 12 HOURS (Patient taking differently: Inhale 2 puffs into the lungs every 12 (twelve) hours. )   . furosemide (LASIX) 20 MG tablet Take 2 tablets (40 mg total) by mouth daily. (Patient taking differently: Take 20 mg by mouth daily. )   . gabapentin (NEURONTIN) 100 MG capsule Take 300 mg by mouth in the morning, at noon, and at bedtime.   Marland Kitchen guaiFENesin (MUCINEX) 600 MG 12 hr tablet Take 600 mg by mouth every evening.    Marland Kitchen ipratropium (ATROVENT) 0.03 % nasal spray Place 2 sprays into both nostrils 2 (two) times daily.    . Ipratropium-Albuterol (COMBIVENT RESPIMAT) 20-100 MCG/ACT AERS respimat Inhale 1 puff into the lungs every 4 (four) hours.  07/27/2019: Pt confirmed he takes every 4 hours  . levalbuterol (XOPENEX) 1.25 MG/3ML nebulizer solution Take 1.25 mg (  3 mLs total) by nebulization every 4 (four) hours. (Patient taking differently: Take 3 mLs by nebulization every 4 (four) hours as needed for wheezing or shortness of breath. )   . loratadine (CLARITIN) 10 MG tablet Take 10 mg by mouth at bedtime.   . lovastatin (MEVACOR) 20 MG tablet Take 20 mg by mouth at bedtime.   . metoprolol succinate (TOPROL-XL) 25 MG 24 hr tablet Take 25 mg by mouth daily.   . nitroGLYCERIN (NITROSTAT) 0.4 MG SL tablet Place 0.4 mg under the tongue every 5 (five) minutes as  needed for chest pain.   Marland Kitchen oxyCODONE-acetaminophen (PERCOCET) 7.5-325 MG tablet Take 1-2 tablets by mouth every 6 (six) hours as needed for moderate pain.   . pantoprazole (PROTONIX) 40 MG tablet Take 40 mg by mouth daily before breakfast.    . predniSONE (DELTASONE) 50 MG tablet Take 1 tablet (50 mg total) by mouth daily with breakfast.   . Respiratory Therapy Supplies (FLUTTER) DEVI Use as directed.   Marland Kitchen SPIRIVA RESPIMAT 1.25 MCG/ACT AERS INHALE 2 PUFFS BY MOUTH INTO THE LUNGS DAILY (Patient taking differently: Inhale 2 puffs into the lungs daily. )   . tamsulosin (FLOMAX) 0.4 MG CAPS capsule Take 0.4 mg by mouth daily after supper.    . vitamin B-12 (CYANOCOBALAMIN) 1000 MCG tablet Take 1,000 mcg by mouth daily.   Marland Kitchen warfarin (COUMADIN) 5 MG tablet Take 5 mg by mouth daily.   . Wound Dressings (RESTORE WOUND CARE DRESSING) PADS Apply 1 each topically daily.     No facility-administered encounter medications on file as of 12/01/2019.    Functional Status:  In your present state of health, do you have any difficulty performing the following activities: 12/01/2019 11/22/2019  Hearing? N N  Vision? N N  Difficulty concentrating or making decisions? N N  Walking or climbing stairs? Y Y  Comment - -  Dressing or bathing? N N  Comment - -  Doing errands, shopping? [name]  Preparing Food and eating ? N -  Using the Toilet? N -  In the past six months, have you accidently leaked urine? N -  Do you have problems with loss of bowel control? N -  Managing your Medications? N -  Managing your Finances? Y -  Housekeeping or managing your Housekeeping? Y -  Some recent data might be hidden    Fall/Depression Screening: Fall Risk  12/01/2019 11/02/2019  Falls in the past year? 1 0  Number falls in past yr: 0 0  Injury with Fall? 0 0  Follow up - Falls evaluation completed   PHQ 2/9 Scores 12/01/2019 10/30/2019  PHQ - 2 Score 0 0   SDOH Screenings   Alcohol Screen:   . Last Alcohol Screening Score  (AUDIT):   Depression (PHQ2-9): Low Risk   . PHQ-2 Score: 0  Financial Resource Strain:   . Difficulty of Paying Living Expenses:   Food Insecurity:   . Worried About Charity fundraiser in the Last Year:   . [employer] of Food in the Last Year:   Housing: [place]   . Last Housing Risk Score: 0  Physical Activity:   . Days of Exercise per Week:   . Minutes of Exercise per Session:   Social Connections:   . Frequency of Communication with Friends and Family:   . Frequency of Social Gatherings with Friends and Family:   . Attends Religious Services:   . Active Member of Clubs or Organizations:   .  Attends Archivist Meetings:   Marland Kitchen Marital Status:   Stress:   . Feeling of Stress :   Tobacco Use: Medium Risk  . Smoking Tobacco Use: Former Smoker  . Smokeless Tobacco Use: Never Used  Transportation Needs: No Transportation Needs  . Lack of Transportation (Medical): No  . Lack of Transportation (Non-Medical): No     Assessment: Patient post-hospitalization for Left BKA. Patient adjusting to new double amputee status.    Plan:  [facility] CM Care Plan Problem One     Most Recent Value  Care Plan Problem One Post Hospitalization Left Below the Knee Amputation  Role Documenting the Problem One Care Management Telephonic Coordinator  Care Plan for Problem One Active  [facility]N Long Term Goal  Patient will not experience unplanned hospital readmission within the next 31 days  THN Long Term Goal Start Date 12/01/19  Interventions for Problem [place] Long Term Goal Discussed with patient current clinical condition since hospital discharge, confirmed that patient has all medications and denies concerns around medications, reviewed post-hospital discharge instructions with patient and confirmed that patient has reliable transportation to all scheduled provider appointments post-hospital discharge.  THN CM Short Term Goal #1  Patient will actively participate in home health services within the next  14 days  THN CM Short Term Goal #1 Start Date 12/01/19  Interventions for Short Term Goal #1 Patient discharged on 11/30/19.  [facility] home health involved prior to hospitalization.  Wife waiting for call from nurse for a visit. Wife aware of number to [facility].  THN CM Short Term Goal #2  Patient will not experience signs of infection to Left BKA site within 30 days.  THN CM Short Term Goal #2 Start Date 12/01/19  Interventions for Short Term Goal #2 RN CM discussed with spouse signs of infection and importance of notifying physician for any changes to surgical site. Wife reports sutures intact and denies signs of infection.     RN CM will provide ongoing education and support to patient through phone calls.   RN CM will send welcome packet with consent to patient.   RN CM will send initial barriers letter, assessment, and care plan to primary care physician.   RN CM will contact caregiver/patient again in the month of July and caregiver/patient agrees to next contact.

## 2019-12-03 DIAGNOSIS — I480 Paroxysmal atrial fibrillation: Secondary | ICD-10-CM | POA: Diagnosis not present

## 2019-12-03 DIAGNOSIS — I739 Peripheral vascular disease, unspecified: Secondary | ICD-10-CM | POA: Diagnosis not present

## 2019-12-03 DIAGNOSIS — I110 Hypertensive heart disease with heart failure: Secondary | ICD-10-CM | POA: Diagnosis not present

## 2019-12-03 DIAGNOSIS — S51012D Laceration without foreign body of left elbow, subsequent encounter: Secondary | ICD-10-CM | POA: Diagnosis not present

## 2019-12-03 DIAGNOSIS — F418 Other specified anxiety disorders: Secondary | ICD-10-CM | POA: Diagnosis not present

## 2019-12-03 DIAGNOSIS — E785 Hyperlipidemia, unspecified: Secondary | ICD-10-CM | POA: Diagnosis not present

## 2019-12-03 DIAGNOSIS — M5000 Cervical disc disorder with myelopathy, unspecified cervical region: Secondary | ICD-10-CM | POA: Diagnosis not present

## 2019-12-03 DIAGNOSIS — I5023 Acute on chronic systolic (congestive) heart failure: Secondary | ICD-10-CM | POA: Diagnosis not present

## 2019-12-03 DIAGNOSIS — J449 Chronic obstructive pulmonary disease, unspecified: Secondary | ICD-10-CM | POA: Diagnosis not present

## 2019-12-04 ENCOUNTER — Telehealth: Payer: Self-pay | Admitting: Primary Care

## 2019-12-04 NOTE — Telephone Encounter (Signed)
T/c to patient to schedule PC visit. Patient recounted many hospital stays recently. He is busy with home health and other follow ups. We tentatively scheduled 01/01/20 for a visit.

## 2019-12-05 DIAGNOSIS — J449 Chronic obstructive pulmonary disease, unspecified: Secondary | ICD-10-CM | POA: Diagnosis not present

## 2019-12-05 DIAGNOSIS — D6851 Activated protein C resistance: Secondary | ICD-10-CM | POA: Diagnosis not present

## 2019-12-05 DIAGNOSIS — Z09 Encounter for follow-up examination after completed treatment for conditions other than malignant neoplasm: Secondary | ICD-10-CM | POA: Diagnosis not present

## 2019-12-05 DIAGNOSIS — Z87891 Personal history of nicotine dependence: Secondary | ICD-10-CM | POA: Diagnosis not present

## 2019-12-05 DIAGNOSIS — E785 Hyperlipidemia, unspecified: Secondary | ICD-10-CM | POA: Diagnosis not present

## 2019-12-05 DIAGNOSIS — I482 Chronic atrial fibrillation, unspecified: Secondary | ICD-10-CM | POA: Diagnosis not present

## 2019-12-05 DIAGNOSIS — I739 Peripheral vascular disease, unspecified: Secondary | ICD-10-CM | POA: Diagnosis not present

## 2019-12-05 DIAGNOSIS — E782 Mixed hyperlipidemia: Secondary | ICD-10-CM | POA: Diagnosis not present

## 2019-12-05 DIAGNOSIS — I10 Essential (primary) hypertension: Secondary | ICD-10-CM | POA: Diagnosis not present

## 2019-12-05 DIAGNOSIS — I5022 Chronic systolic (congestive) heart failure: Secondary | ICD-10-CM | POA: Diagnosis not present

## 2019-12-05 DIAGNOSIS — Z95810 Presence of automatic (implantable) cardiac defibrillator: Secondary | ICD-10-CM | POA: Diagnosis not present

## 2019-12-05 DIAGNOSIS — F418 Other specified anxiety disorders: Secondary | ICD-10-CM | POA: Diagnosis not present

## 2019-12-05 DIAGNOSIS — T8744 Infection of amputation stump, left lower extremity: Secondary | ICD-10-CM | POA: Diagnosis not present

## 2019-12-05 DIAGNOSIS — I110 Hypertensive heart disease with heart failure: Secondary | ICD-10-CM | POA: Diagnosis not present

## 2019-12-05 DIAGNOSIS — I2510 Atherosclerotic heart disease of native coronary artery without angina pectoris: Secondary | ICD-10-CM | POA: Diagnosis not present

## 2019-12-05 DIAGNOSIS — M5000 Cervical disc disorder with myelopathy, unspecified cervical region: Secondary | ICD-10-CM | POA: Diagnosis not present

## 2019-12-05 DIAGNOSIS — Z89512 Acquired absence of left leg below knee: Secondary | ICD-10-CM | POA: Diagnosis not present

## 2019-12-05 DIAGNOSIS — I480 Paroxysmal atrial fibrillation: Secondary | ICD-10-CM | POA: Diagnosis not present

## 2019-12-05 DIAGNOSIS — I5023 Acute on chronic systolic (congestive) heart failure: Secondary | ICD-10-CM | POA: Diagnosis not present

## 2019-12-05 DIAGNOSIS — S51012D Laceration without foreign body of left elbow, subsequent encounter: Secondary | ICD-10-CM | POA: Diagnosis not present

## 2019-12-07 DIAGNOSIS — E785 Hyperlipidemia, unspecified: Secondary | ICD-10-CM | POA: Diagnosis not present

## 2019-12-07 DIAGNOSIS — I739 Peripheral vascular disease, unspecified: Secondary | ICD-10-CM | POA: Diagnosis not present

## 2019-12-07 DIAGNOSIS — S51012D Laceration without foreign body of left elbow, subsequent encounter: Secondary | ICD-10-CM | POA: Diagnosis not present

## 2019-12-07 DIAGNOSIS — M5000 Cervical disc disorder with myelopathy, unspecified cervical region: Secondary | ICD-10-CM | POA: Diagnosis not present

## 2019-12-07 DIAGNOSIS — I5023 Acute on chronic systolic (congestive) heart failure: Secondary | ICD-10-CM | POA: Diagnosis not present

## 2019-12-07 DIAGNOSIS — J449 Chronic obstructive pulmonary disease, unspecified: Secondary | ICD-10-CM | POA: Diagnosis not present

## 2019-12-07 DIAGNOSIS — I110 Hypertensive heart disease with heart failure: Secondary | ICD-10-CM | POA: Diagnosis not present

## 2019-12-07 DIAGNOSIS — F418 Other specified anxiety disorders: Secondary | ICD-10-CM | POA: Diagnosis not present

## 2019-12-07 DIAGNOSIS — I480 Paroxysmal atrial fibrillation: Secondary | ICD-10-CM | POA: Diagnosis not present

## 2019-12-11 DIAGNOSIS — J449 Chronic obstructive pulmonary disease, unspecified: Secondary | ICD-10-CM | POA: Diagnosis not present

## 2019-12-11 DIAGNOSIS — F17218 Nicotine dependence, cigarettes, with other nicotine-induced disorders: Secondary | ICD-10-CM | POA: Diagnosis not present

## 2019-12-12 ENCOUNTER — Ambulatory Visit
Admission: RE | Admit: 2019-12-12 | Discharge: 2019-12-12 | Disposition: A | Discharge status: Home / Self Care | Payer: Medicare HMO | Source: Ambulatory Visit | Attending: Acute Care | Admitting: Acute Care

## 2019-12-12 ENCOUNTER — Other Ambulatory Visit: Payer: Self-pay

## 2019-12-12 DIAGNOSIS — T17908A Unspecified foreign body in respiratory tract, part unspecified causing other injury, initial encounter: Secondary | ICD-10-CM | POA: Insufficient documentation

## 2019-12-12 DIAGNOSIS — R1314 Dysphagia, pharyngoesophageal phase: Secondary | ICD-10-CM | POA: Insufficient documentation

## 2019-12-12 DIAGNOSIS — Z0389 Encounter for observation for other suspected diseases and conditions ruled out: Secondary | ICD-10-CM | POA: Diagnosis not present

## 2019-12-12 DIAGNOSIS — I482 Chronic atrial fibrillation, unspecified: Secondary | ICD-10-CM | POA: Diagnosis not present

## 2019-12-12 NOTE — Therapy (Addendum)
[place], [place], [address] Phone: [phone]   Fax:     Modified Barium Swallow  Patient Details  Name: Tyler [name] MRN: [id number] Date of Birth: 01-Apr-1956 No data recorded  Encounter Date: 12/12/2019   End of Session - 12/12/19 1703    Visit Number 1    Number of Visits 1    Date for SLP Re-Evaluation 12/12/19    SLP Start Time 66    SLP Stop Time  1415    SLP Time Calculation (min) 75 min    Activity Tolerance Patient tolerated treatment well           Past Medical History:  Diagnosis Date  . AICD (automatic cardioverter/defibrillator) present   . Anxiety   . Arthritis   . Atherosclerosis of artery of extremity with ulceration ([place]) 09/2019   left foot s/p toe amp requiring debridement and futher toe amputations.  . Atrial fibrillation ([place])   . Cervical spinal stenosis    with neuropathy  . CHF (congestive heart failure) ([place])   . Constipation   . COPD (chronic obstructive pulmonary disease) ([place])   . Coronary artery disease   . Depression   . Dyspnea   . Dysrhythmia    atrial fibrillation  . Emphysema of lung ([place])   . Factor 5 Leiden mutation, heterozygous ([place])    on coumadin  . GERD (gastroesophageal reflux disease)   . Hypertension   . Lung nodule seen on imaging study    being followed by dr. [name]. just watching it for last few years, without change  . Myocardial infarction [facility]) 2004   stent placed, pacemaker implanted 2005  . Osteomyelitis ([place]) 10/2019   left foot  . Oxygen dependent    requires 2L nasal prong oxygen 24 hours a day  . Peripheral vascular disease ([place])   . Presence of permanent cardiac pacemaker 2005,2018  . Sleep apnea    waiting to have sleep study. Used bipap while hospitalized and said it was great for him.    Past Surgical History:  Procedure Laterality Date  . ABOVE KNEE LEG AMPUTATION Right    after below the knee  amputation   . AMPUTATION Left 11/22/2019   Procedure: AMPUTATION BELOW KNEE;  Surgeon: Algernon Huxley, MD;  Location: [facility] ORS;  Service: Vascular;  Laterality: Left;  . AMPUTATION TOE Left 08/04/2019   Procedure: AMPUTATION TOE MPJ LEFT;  Surgeon: Samara Deist, DPM;  Location: [facility];  Service: Podiatry;  Laterality: Left;  . AMPUTATION TOE Left 10/06/2019   Procedure: AMPUTATION TOE MPJ T1,T2 LEFT;  Surgeon: Samara Deist, DPM;  Location: [facility];  Service: Podiatry;  Laterality: Left;  . APPLICATION OF WOUND VAC Left 01/12/2018   Procedure: APPLICATION OF WOUND VAC;  Surgeon: Algernon Huxley, MD;  Location: [facility];  Service: Vascular;  Laterality: Left;  . BELOW KNEE LEG AMPUTATION Right   . CATARACT EXTRACTION, BILATERAL Bilateral   . CORONARY ANGIOPLASTY  2005   stent x 1 placed  . ENDARTERECTOMY FEMORAL Left 08/11/2017   Procedure: ENDARTERECTOMY FEMORAL;  Surgeon: Algernon Huxley, MD;  Location: [facility] OR[facility];  Service: Vascular;  Laterality: Left;  . HEMATOMA EVACUATION Left 01/12/2018   Procedure: EVACUATION HEMATOMA ( DRAINING OF SEROMA);  Surgeon: Algernon Huxley, MD;  Location: [facility];  Service: Vascular;  Laterality: Left;  . IMPLANTABLE CARDIOVERTER DEFIBRILLATOR (ICD) GENERATOR CHANGE Left 02/10/2017   Procedure: ICD GENERATOR CHANGE;  Surgeon: Isaias Cowman, MD;  Location: [facility];  Service: Cardiovascular;  Laterality: Left;  . INSERT / REPLACE / REMOVE PACEMAKER  2423,5361, 2018  . LOWER EXTREMITY ANGIOGRAPHY Left 06/07/2017   Procedure: LOWER EXTREMITY ANGIOGRAPHY;  Surgeon: Algernon Huxley, MD;  Location: [place] [facility];  Service: Cardiovascular;  Laterality: Left;  . LOWER EXTREMITY ANGIOGRAPHY Left 10/05/2019   Procedure: LOWER EXTREMITY ANGIOGRAPHY;  Surgeon: Algernon Huxley, MD;  Location: [place] [facility];  Service: Cardiovascular;  Laterality: Left;  . LOWER EXTREMITY INTERVENTION  06/07/2017   Procedure: LOWER EXTREMITY INTERVENTION;  Surgeon: Algernon Huxley, MD;   Location: [place] [facility];  Service: Cardiovascular;;  . PERIPHERAL VASCULAR CATHETERIZATION Left 12/09/2015   Procedure: Lower Extremity Angiography;  Surgeon: Algernon Huxley, MD;  Location: [place] [facility];  Service: Cardiovascular;  Laterality: Left;  . PERIPHERAL VASCULAR CATHETERIZATION  12/09/2015   Procedure: Lower Extremity Intervention;  Surgeon: Algernon Huxley, MD;  Location: [place] [facility];  Service: Cardiovascular;;  . TONSILLECTOMY    . TRANSMETATARSAL AMPUTATION Left 10/20/2019   Procedure: TRANSMETATARSAL AMPUTATION;  Surgeon: Sharlotte Alamo, DPM;  Location: [facility];  Service: Podiatry;  Laterality: Left;    There were no vitals filed for this visit.      Subjective: Patient behavior: (alertness, ability to follow instructions, etc.): pt A/Ox3 and verbally engaged w/ SLP during this session; followed instructions w/ any strategies. Pt has a baseline of COPD, GERD, O2 dependent, dyspnea, sleep apnea, anxiety. He denied any Neurological events. Endorsed eating a regular diet w/ no recent weight loss. Chief complaint: dysphagia. OM exam WFL; missing Dentition, molars.   Objective:  Radiological Procedure: A videoflouroscopic evaluation of oral-preparatory, reflex initiation, and pharyngeal phases of the swallow was performed; as well as a screening of the upper esophageal phase.  I. POSTURE: upright II. VIEW: lateral III. COMPENSATORY STRATEGIES: Small sips, Slowly; f/y Dry swallow intermittently IV. BOLUSES ADMINISTERED:  Thin Liquid: 7 trials  Nectar-thick Liquid: 1 trial  Honey-thick Liquid: NT  Puree: 3 trials  Mechanical Soft: 2 trials  Barium Tablet: 1 trial in puree V. RESULTS OF EVALUATION: A. ORAL PREPARATORY PHASE: (The lips, tongue, and velum are observed for strength and coordination)       **Overall Severity Rating: [facility] for consistencies given(softened, moist). Noted intermittent premature spillage w/ trials of thin liquids w/ larger bolus sips --  encouraged pt to take Smaller sips more Slowly which aided control.  B. SWALLOW INITIATION/REFLEX: (The reflex is normal if "triggered" by the time the bolus reached the base of the tongue)  **Overall Severity Rating: grossly WFL-mild delay. Pharyngeal swallow initiation appeared grossly adequate in timing at/around the Valleculae w/ Larger thin liquid bolus trials spilling from the Valleculae. Again, w/ Smaller sips, pharyngeal swallow initiation appeared more timely. Adequate and timely airway closure noted w/ all consistencies during the swallow. There remained a slight amount of oral/BOT residue from bolus consistencies, primarily liquids, which collected in the Valleculae post initial swallow. Pt was able to clear this reside w/ a f/u, DRY swallow. A trace amount of undercoating from bolus residue was noted but did Not appear to increase in size/amount during this study.   C. PHARYNGEAL PHASE: (Pharyngeal function is normal if the bolus shows rapid, smooth, and continuous transit through the pharynx and there is no pharyngeal residue after the swallow)  **Overall Severity Rating: grossly WFL  D. LARYNGEAL PENETRATION: (Material entering into the laryngeal inlet/vestibule but not aspirated): undercoating of the  Epiglottis noted - No buildup of material noted in the laryngeal vestibule  E. ASPIRATION: NONE F. ESOPHAGEAL PHASE: (Screening of the upper esophagus):   ASSESSMENT: Pt appeared to exhibit a grossly WFL oropharyngeal swallow w/ no overt neuromuscular weakness/deficits noted; No oropharyngeal phase dysphagia resulting in aspiration. Pt does have a Baseline of GERD which, especially if chronic, can impact pharyngeal tissue and sensation for timing of pharyngeal swallow initiation. Pt appeared to exhibit adequate airway closure and protection during swallowing thus would be at reduce risk for negative sequelae from consistent aspiration as no aspiration was noted during this study today. Pt is  missing Dentition/molars at Baseline and benefits from soft, moistened foods Cut Small. He also benefits from using a f/u, Dry swallow following boluses to clear any oropharyngeal collecting of bolus residue b/t bites/sips(he was independent w/ this the majority of the time).  During the oral phase, intermittent premature spillage w/ trials of thin liquids (Larger bolus sips) was noted -- encouraged pt to take Smaller sips more Slowly which aided control and less spillage. During the pharyngeal phase, pharyngeal swallow initiation appeared grossly adequate in timing at/around the Valleculae; w/ Larger thin liquid bolus trials spilling from the Valleculae. Again, when using Smaller sips, pharyngeal swallow initiation appeared more timely, controlled. Adequate, and timely, airway closure noted w/ all consistencies during the swallow. There remained a slight amount of oral/BOT residue from bolus consistencies, primarily liquids, which collected in the Valleculae post initial swallow. Pt was able to clear this reside w/ a f/u, DRY swallow(occurred fairly independently). A trace amount of undercoating from bolus residue occurred inconsistently but did Not appear to increase in size/amount during this study. A Barium Tablet was given Whole in Puree for ease and safety of swallowing -- cohesiveness, control.  Pt was educated on, and practiced, general aspiration precautions and swallowing strategy of f/u, Dry swallow. All questions answered.    PLAN/RECOMMENDATIONS:  A. Diet: Regular/Mech soft diet consistency(foods cut well, moistened); Thin liquids VIA CUP for control when drinking. SMALL sips/ bites SLOWLY during all oral intake. Pills Whole in PUREE for safe, controlled swallowing  B. Swallowing Precautions: general aspiration precautions including f/u, DRY swallows intermittently during/post oral intake; REFLUX precautions - Baseline GERD  C. Recommended consultation to: GI for formal assessment and management  of any Esophageal dysmotility, Reflux issues  D. Therapy recommendations: None at this time  E. Results and recommendations were discussed w/ pt and Wife after study; video viewed and questions answered. Recommendations were discussed w/ pt verbalizing understanding. Handouts given.            Dysphagia, pharyngoesophageal phase  Aspiration into airway, initial encounter - Plan: DG SWALLOW FUNC OP MEDICARE SPEECH PATH, DG SWALLOW FUNC OP MEDICARE SPEECH PATH        Problem List Patient Active Problem List   Diagnosis Date Noted  . 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 BPH (benign prostatic hyperplasia) 10/25/2019  . Depression with anxiety 10/25/2019  . Osteomyelitis of left leg ([place]) 10/18/2019  . Leg swelling 03/26/2019  . Palliative care by specialist 02/16/2019  . Atherosclerosis of native arteries of the extremities with ulceration ([place]) 01/31/2019  . Postoperative seroma of subcutaneous tissue after non-dermatologic procedure 02/04/2018  . Current mild episode of major depressive disorder without prior episode ([place]) 12/14/2017  . Atherosclerosis of artery of extremity with rest pain ([place]) 08/11/2017  . COPD (chronic obstructive pulmonary disease) ([place]) 06/22/2017  . Cervical disc disease with myelopathy 05/28/2017  .  COPD with acute exacerbation ([place]) 10/13/2016  . Acute on chronic respiratory failure with hypoxia ([place]) 10/13/2016  . Acute on chronic systolic (congestive) heart failure ([place]) 10/13/2016  . AF (paroxysmal atrial fibrillation) ([place]) 10/13/2016  . Hx of AKA (above knee amputation), right ([place]) 05/15/2016  . Essential hypertension, benign 05/15/2016  . PVD (peripheral vascular disease) ([place]) 05/15/2016  . Atherosclerotic peripheral vascular disease with intermittent claudication ([place]) 08/01/2014  . Moderate tricuspid insufficiency 08/01/2014  . Mixed hyperlipidemia 07/23/2014  . AICD (automatic  cardioverter/defibrillator) present 06/12/2011  . Factor V Leiden mutation ([place]) 06/12/2011  . Coronary artery disease 06/12/2011      Orinda Kenner, MS, CCC-SLP [place] 12/12/2019, 5:05 PM  [facility] [place], [place], [address] Phone: [phone]   Fax:     Name: KHARSON RASMUSSON MRN: [id number] Date of Birth: 06/01/1955

## 2019-12-13 DIAGNOSIS — F418 Other specified anxiety disorders: Secondary | ICD-10-CM | POA: Diagnosis not present

## 2019-12-13 DIAGNOSIS — J449 Chronic obstructive pulmonary disease, unspecified: Secondary | ICD-10-CM | POA: Diagnosis not present

## 2019-12-13 DIAGNOSIS — S51012D Laceration without foreign body of left elbow, subsequent encounter: Secondary | ICD-10-CM | POA: Diagnosis not present

## 2019-12-13 DIAGNOSIS — I110 Hypertensive heart disease with heart failure: Secondary | ICD-10-CM | POA: Diagnosis not present

## 2019-12-13 DIAGNOSIS — I480 Paroxysmal atrial fibrillation: Secondary | ICD-10-CM | POA: Diagnosis not present

## 2019-12-13 DIAGNOSIS — I5023 Acute on chronic systolic (congestive) heart failure: Secondary | ICD-10-CM | POA: Diagnosis not present

## 2019-12-13 DIAGNOSIS — E785 Hyperlipidemia, unspecified: Secondary | ICD-10-CM | POA: Diagnosis not present

## 2019-12-13 DIAGNOSIS — I739 Peripheral vascular disease, unspecified: Secondary | ICD-10-CM | POA: Diagnosis not present

## 2019-12-13 DIAGNOSIS — M5000 Cervical disc disorder with myelopathy, unspecified cervical region: Secondary | ICD-10-CM | POA: Diagnosis not present

## 2019-12-14 ENCOUNTER — Other Ambulatory Visit: Payer: Self-pay

## 2019-12-14 ENCOUNTER — Encounter (INDEPENDENT_AMBULATORY_CARE_PROVIDER_SITE_OTHER): Payer: Self-pay | Admitting: Nurse Practitioner

## 2019-12-14 ENCOUNTER — Ambulatory Visit (INDEPENDENT_AMBULATORY_CARE_PROVIDER_SITE_OTHER): Payer: Medicare HMO | Admitting: Nurse Practitioner

## 2019-12-14 VITALS — BP 95/62 | HR 79 | Resp 16 | Ht 73.0 in | Wt 146.0 lb

## 2019-12-14 DIAGNOSIS — E782 Mixed hyperlipidemia: Secondary | ICD-10-CM

## 2019-12-14 DIAGNOSIS — S88112A Complete traumatic amputation at level between knee and ankle, left lower leg, initial encounter: Secondary | ICD-10-CM

## 2019-12-14 DIAGNOSIS — I70269 Atherosclerosis of native arteries of extremities with gangrene, unspecified extremity: Secondary | ICD-10-CM

## 2019-12-15 ENCOUNTER — Other Ambulatory Visit: Payer: Self-pay

## 2019-12-15 NOTE — Patient Outreach (Signed)
[facility]) Care Management  12/15/2019  Tyler [name] 1955-08-10 [id number]   Telephone call to patient for follow up. No answer.  Unable to leave a message.    Plan: RN CM will attempt again within 4 business days and send letter.   [name], RN, MSN [place] Management Care Management Coordinator Direct Line [phone] Cell [phone] Toll Free: [phone]  Fax: [phone]

## 2019-12-18 ENCOUNTER — Other Ambulatory Visit: Payer: Self-pay

## 2019-12-18 DIAGNOSIS — E785 Hyperlipidemia, unspecified: Secondary | ICD-10-CM | POA: Diagnosis not present

## 2019-12-18 DIAGNOSIS — S51012D Laceration without foreign body of left elbow, subsequent encounter: Secondary | ICD-10-CM | POA: Diagnosis not present

## 2019-12-18 DIAGNOSIS — I739 Peripheral vascular disease, unspecified: Secondary | ICD-10-CM | POA: Diagnosis not present

## 2019-12-18 DIAGNOSIS — M5000 Cervical disc disorder with myelopathy, unspecified cervical region: Secondary | ICD-10-CM | POA: Diagnosis not present

## 2019-12-18 DIAGNOSIS — I480 Paroxysmal atrial fibrillation: Secondary | ICD-10-CM | POA: Diagnosis not present

## 2019-12-18 DIAGNOSIS — I110 Hypertensive heart disease with heart failure: Secondary | ICD-10-CM | POA: Diagnosis not present

## 2019-12-18 DIAGNOSIS — J449 Chronic obstructive pulmonary disease, unspecified: Secondary | ICD-10-CM | POA: Diagnosis not present

## 2019-12-18 DIAGNOSIS — I5023 Acute on chronic systolic (congestive) heart failure: Secondary | ICD-10-CM | POA: Diagnosis not present

## 2019-12-18 DIAGNOSIS — F418 Other specified anxiety disorders: Secondary | ICD-10-CM | POA: Diagnosis not present

## 2019-12-18 NOTE — Patient Outreach (Signed)
[facility]) Care Management  [place]  12/18/2019   Tyler [name] 1956/03/08 [id number]  Subjective: Incoming call from spouse Tyler [name]. She reports patient doing good right now. She states patient is active with home health. She denies any more falls. Discussed transfer safety.  She reports that left stump site looks good and that patient has seen surgeon and will see again this week for the rest of staples to be removed.  Discussed signs of infection to stump site.  She verbalized understanding and denies any signs.  She reports patient breathing is ok but he gets winded with activity.  Discussed COPD and continuing regimen with nebulizer and inhalers. She verbalized understanding.     Objective:   Encounter Medications:  Outpatient Encounter Medications as of 12/18/2019  Medication Sig Note  . acetaminophen (TYLENOL) 500 MG tablet Take 1,000 mg by mouth daily as needed for moderate pain or headache.   . ALPRAZolam (XANAX) 1 MG tablet Take 1 mg by mouth 2 (two) times daily as needed for anxiety or sleep.    Marland Kitchen amoxicillin-clavulanate (AUGMENTIN) 875-125 MG tablet Take 1 tablet by mouth every 12 (twelve) hours. (Patient not taking: Reported on 12/14/2019)   . aspirin EC 81 MG tablet Take 1 tablet (81 mg total) by mouth daily. (Patient taking differently: Take 81 mg by mouth daily after supper. )   . budesonide (PULMICORT) 0.25 MG/2ML nebulizer solution Inhale into the lungs.   . citalopram (CELEXA) 10 MG tablet Take 10 mg by mouth daily.   Marland Kitchen docusate sodium (COLACE) 100 MG capsule Take 100 mg by mouth daily as needed (for constipation.).    Marland Kitchen ENTRESTO 24-26 MG Take 1 tablet by mouth 2 (two) times daily.   . fluticasone (FLONASE) 50 MCG/ACT nasal spray Place 2 sprays into both nostrils daily.   . fluticasone-salmeterol (ADVAIR HFA) 115-21 MCG/ACT inhaler USE 2 INHALATIONS ORALLY EVERY 12 HOURS (Patient taking differently: Inhale 2 puffs into the lungs every 12 (twelve)  hours. )   . furosemide (LASIX) 20 MG tablet Take 2 tablets (40 mg total) by mouth daily. (Patient taking differently: Take 20 mg by mouth daily. )   . gabapentin (NEURONTIN) 100 MG capsule Take 300 mg by mouth in the morning, at noon, and at bedtime.   Marland Kitchen guaiFENesin (MUCINEX) 600 MG 12 hr tablet Take 600 mg by mouth every evening.    Marland Kitchen ipratropium (ATROVENT) 0.03 % nasal spray Place 2 sprays into both nostrils 2 (two) times daily.    . Ipratropium-Albuterol (COMBIVENT RESPIMAT) 20-100 MCG/ACT AERS respimat Inhale 1 puff into the lungs every 4 (four) hours.  07/27/2019: Pt confirmed he takes every 4 hours  . levalbuterol (XOPENEX) 1.25 MG/3ML nebulizer solution Take 1.25 mg (3 mLs total) by nebulization every 4 (four) hours. (Patient taking differently: Take 3 mLs by nebulization every 4 (four) hours as needed for wheezing or shortness of breath. )   . loratadine (CLARITIN) 10 MG tablet Take 10 mg by mouth at bedtime.   . lovastatin (MEVACOR) 20 MG tablet Take 20 mg by mouth at bedtime.   . metoprolol succinate (TOPROL-XL) 25 MG 24 hr tablet Take 25 mg by mouth daily.   . nitroGLYCERIN (NITROSTAT) 0.4 MG SL tablet Place 0.4 mg under the tongue every 5 (five) minutes as needed for chest pain.   Marland Kitchen oxyCODONE-acetaminophen (PERCOCET) 7.5-325 MG tablet Take 1-2 tablets by mouth every 6 (six) hours as needed for moderate pain.   . pantoprazole (PROTONIX) 40  MG tablet Take 40 mg by mouth daily before breakfast.    . predniSONE (DELTASONE) 50 MG tablet Take 1 tablet (50 mg total) by mouth daily with breakfast. (Patient not taking: Reported on 12/14/2019)   . Respiratory Therapy Supplies (FLUTTER) DEVI Use as directed.   . SPIRIVA RESPIMAT 1.25 MCG/ACT AERS INHALE 2 PUFFS BY MOUTH INTO THE LUNGS DAILY (Patient not taking: Reported on 12/14/2019)   . tamsulosin (FLOMAX) 0.4 MG CAPS capsule Take 0.4 mg by mouth daily after supper.    . vitamin B-12 (CYANOCOBALAMIN) 1000 MCG tablet Take 1,000 mcg by mouth daily.    . warfarin (COUMADIN) 5 MG tablet Take 5 mg by mouth daily.   . Wound Dressings (RESTORE WOUND CARE DRESSING) PADS Apply 1 each topically daily.     No facility-administered encounter medications on file as of 12/18/2019.    Functional Status:  In your present state of health, do you have any difficulty performing the following activities: 12/18/2019 12/01/2019  Hearing? N N  Vision? N N  Difficulty concentrating or making decisions? N N  Walking or climbing stairs? Y Y  Comment - -  Dressing or bathing? N N  Comment - -  Doing errands, shopping? Y Y  Preparing Food and eating ? N N  Using the Toilet? N N  In the past six months, have you accidently leaked urine? N N  Do you have problems with loss of bowel control? N N  Managing your Medications? N N  Managing your Finances? Y Y  Comment wife assists -  Housekeeping or managing your Housekeeping? Y Y  Comment wife assists -  Some recent data might be hidden    Fall/Depression Screening: Fall Risk  12/01/2019 11/02/2019  Falls in the past year? 1 0  Number falls in past yr: 0 0  Injury with Fall? 0 0  Follow up - Falls evaluation completed   PHQ 2/9 Scores 12/01/2019 10/30/2019  PHQ - 2 Score 0 0    Assessment: Patient continues post op recovery.    Plan:  [facility] CM Care Plan Problem One     Most Recent Value  Care Plan Problem One Post Hospitalization Left Below the Knee Amputation  Role Documenting the Problem One Care Management Telephonic Coordinator  Care Plan for Problem One Active  THN Long Term Goal  Patient will not experience unplanned hospital readmission within the next 31 days  THN Long Term Goal Start Date 12/01/19  Interventions for Problem One Long Term Goal REviewed with spouse stump site and signs of infection. Discussed importance of follow up.    THN CM Short Term Goal #1  Patient will actively participate in home health services within the next 14 days  THN CM Short Term Goal #1 Start Date 12/01/19  THN CM  Short Term Goal #1 Met Date 12/18/19  THN CM Short Term Goal #2  Patient will not experience signs of infection to Left BKA site within 30 days.  THN CM Short Term Goal #2 Start Date 12/01/19  Interventions for Short Term Goal #2 Reviewed signs of infection and notification of physician.        RN CM will contact again in the month of August and spouse agreeable.    Dionne J Leath, RN, MSN [facility] Care Management Care Management Coordinator Direct Line [phone] Cell [phone] Toll Free: [phone]  Fax: [phone]   

## 2019-12-18 NOTE — Patient Outreach (Signed)
[facility]) Care Management  12/18/2019  NICK STULTS [birth year] [id number]   Telephone call to patient for follow up. No answer.  HIPAA compliant voice message left.  Plan: RN CM will attempt patient again within 4 business days.  [name], RN, MSN [place] Management Care Management Coordinator Direct Line [phone] Cell [phone] Toll Free: [phone]  Fax: [phone]

## 2019-12-19 ENCOUNTER — Encounter (INDEPENDENT_AMBULATORY_CARE_PROVIDER_SITE_OTHER): Payer: Self-pay | Admitting: Nurse Practitioner

## 2019-12-19 ENCOUNTER — Ambulatory Visit: Payer: Self-pay

## 2019-12-19 DIAGNOSIS — I110 Hypertensive heart disease with heart failure: Secondary | ICD-10-CM | POA: Diagnosis not present

## 2019-12-19 DIAGNOSIS — F418 Other specified anxiety disorders: Secondary | ICD-10-CM | POA: Diagnosis not present

## 2019-12-19 DIAGNOSIS — I739 Peripheral vascular disease, unspecified: Secondary | ICD-10-CM | POA: Diagnosis not present

## 2019-12-19 DIAGNOSIS — J449 Chronic obstructive pulmonary disease, unspecified: Secondary | ICD-10-CM | POA: Diagnosis not present

## 2019-12-19 DIAGNOSIS — M5000 Cervical disc disorder with myelopathy, unspecified cervical region: Secondary | ICD-10-CM | POA: Diagnosis not present

## 2019-12-19 DIAGNOSIS — S51012D Laceration without foreign body of left elbow, subsequent encounter: Secondary | ICD-10-CM | POA: Diagnosis not present

## 2019-12-19 DIAGNOSIS — I5023 Acute on chronic systolic (congestive) heart failure: Secondary | ICD-10-CM | POA: Diagnosis not present

## 2019-12-19 DIAGNOSIS — E785 Hyperlipidemia, unspecified: Secondary | ICD-10-CM | POA: Diagnosis not present

## 2019-12-19 DIAGNOSIS — I480 Paroxysmal atrial fibrillation: Secondary | ICD-10-CM | POA: Diagnosis not present

## 2019-12-19 NOTE — Progress Notes (Signed)
Patient ID: Tyler [name], male    DOB: [birth year], 64 y.o.   MRN: [id number]  HPI  Tyler [name] is a 64 y/o male with a history of CAD, HTN, PVD, atrial fibrillation, depression, GERD, COPD, anxiety, previous tobacco use and chronic heart failure.   Echo report from 10/25/19 reviewed and showed an EF of 55-60% along with moderate LAE and mild/moderate Tyler.   Admitted 10/25/19 due to shortness of breath. Initially needed bipap and then weaned down to home oxygen of 3L. Wound consult obtained. Initially given IV lasix with transition to oral diuretics. Continues on antibiotics due to recent osteomyelitis. Discharged after 4 days. Admitted 10/17/19 due to acute onset of worsening left foot swelling with erythema and discharge. Surgical consult obtained as he has had recent toe amputations. Underwent transmetatarsal amputation on 10/20/2019. Given antibiotics. Discharged after 7 days.   He presents today for a follow-up visit with a chief complaint of moderate shortness of breath upon minimal exertion. He describes this as chronic in nature having been present for several years. He has associated fatigue, cough, chronic constipation and left lower leg phantom pain. He denies any difficulty sleeping, dizziness, abdominal distention, palpitations, leg edema or chest pain.   Past Medical History:  Diagnosis Date  . AICD (automatic cardioverter/defibrillator) present   . Anxiety   . Arthritis   . Atherosclerosis of artery of extremity with ulceration ([place]) 09/2019   left foot s/p toe amp requiring debridement and futher toe amputations.  . Atrial fibrillation ([place])   . Cervical spinal stenosis    with neuropathy  . CHF (congestive heart failure) ([place])   . Constipation   . COPD (chronic obstructive pulmonary disease) ([place])   . Coronary artery disease   . Depression   . Dyspnea   . Dysrhythmia    atrial fibrillation  . Emphysema of lung ([place])   . Factor 5 Leiden mutation, heterozygous ([place])    on  coumadin  . GERD (gastroesophageal reflux disease)   . Hypertension   . Lung nodule seen on imaging study    being followed by dr. [name]. just watching it for last few years, without change  . Myocardial infarction [facility]) 2004   stent placed, pacemaker implanted 2005  . Osteomyelitis ([place]) 10/2019   left foot  . Oxygen dependent    requires 2L nasal prong oxygen 24 hours a day  . Peripheral vascular disease ([place])   . Presence of permanent cardiac pacemaker 2005,2018  . Sleep apnea    waiting to have sleep study. Used bipap while hospitalized and said it was great for him.   Past Surgical History:  Procedure Laterality Date  . ABOVE KNEE LEG AMPUTATION Right    after below the knee amputation   . AMPUTATION Left 11/22/2019   Procedure: AMPUTATION BELOW KNEE;  Surgeon: Algernon Huxley, MD;  Location: [facility];  Service: Vascular;  Laterality: Left;  . AMPUTATION TOE Left 08/04/2019   Procedure: AMPUTATION TOE MPJ LEFT;  Surgeon: Samara Deist, DPM;  Location: [facility];  Service: Podiatry;  Laterality: Left;  . AMPUTATION TOE Left 10/06/2019   Procedure: AMPUTATION TOE MPJ T1,T2 LEFT;  Surgeon: Samara Deist, DPM;  Location: [facility];  Service: Podiatry;  Laterality: Left;  . APPLICATION OF WOUND VAC Left 01/12/2018   Procedure: APPLICATION OF WOUND VAC;  Surgeon: Algernon Huxley, MD;  Location: [facility];  Service: Vascular;  Laterality: Left;  . BELOW KNEE LEG AMPUTATION Right   . CATARACT EXTRACTION,  BILATERAL Bilateral   . CORONARY ANGIOPLASTY  2005   stent x 1 placed  . ENDARTERECTOMY FEMORAL Left 08/11/2017   Procedure: ENDARTERECTOMY FEMORAL;  Surgeon: Algernon Huxley, MD;  Location: [facility];  Service: Vascular;  Laterality: Left;  . HEMATOMA EVACUATION Left 01/12/2018   Procedure: EVACUATION HEMATOMA ( DRAINING OF SEROMA);  Surgeon: Algernon Huxley, MD;  Location: [facility];  Service: Vascular;  Laterality: Left;  . IMPLANTABLE CARDIOVERTER DEFIBRILLATOR (ICD) GENERATOR CHANGE Left 02/10/2017    Procedure: ICD GENERATOR CHANGE;  Surgeon: Isaias Cowman, MD;  Location: [facility];  Service: Cardiovascular;  Laterality: Left;  . INSERT / REPLACE / REMOVE PACEMAKER  1517,6160, 2018  . LOWER EXTREMITY ANGIOGRAPHY Left 06/07/2017   Procedure: LOWER EXTREMITY ANGIOGRAPHY;  Surgeon: Algernon Huxley, MD;  Location: [place] [facility];  Service: Cardiovascular;  Laterality: Left;  . LOWER EXTREMITY ANGIOGRAPHY Left 10/05/2019   Procedure: LOWER EXTREMITY ANGIOGRAPHY;  Surgeon: Algernon Huxley, MD;  Location: [place] [facility];  Service: Cardiovascular;  Laterality: Left;  . LOWER EXTREMITY INTERVENTION  06/07/2017   Procedure: LOWER EXTREMITY INTERVENTION;  Surgeon: Algernon Huxley, MD;  Location: [place] [facility];  Service: Cardiovascular;;  . PERIPHERAL VASCULAR CATHETERIZATION Left 12/09/2015   Procedure: Lower Extremity Angiography;  Surgeon: Algernon Huxley, MD;  Location: [place] [facility];  Service: Cardiovascular;  Laterality: Left;  . PERIPHERAL VASCULAR CATHETERIZATION  12/09/2015   Procedure: Lower Extremity Intervention;  Surgeon: Algernon Huxley, MD;  Location: [place] [facility];  Service: Cardiovascular;;  . TONSILLECTOMY    . TRANSMETATARSAL AMPUTATION Left 10/20/2019   Procedure: TRANSMETATARSAL AMPUTATION;  Surgeon: Sharlotte Alamo, DPM;  Location: [facility];  Service: Podiatry;  Laterality: Left;   Family History  Problem Relation Age of Onset  . Cancer Mother   . Cancer Father   . Heart disease Father    Social History   Tobacco Use  . Smoking status: Former Smoker    Packs/day: 1.00    Years: 42.00    Pack years: 42.00    Types: Cigarettes    Quit date: 09/23/2013    Years since quitting: 6.2  . Smokeless tobacco: Never Used  Substance Use Topics  . Alcohol use: No    Comment: quit 6 years ago   Allergies  Allergen Reactions  . Apixaban Rash  . Rivaroxaban Rash   Prior to Admission medications   Medication Sig Start Date End Date Taking? Authorizing Provider   acetaminophen (TYLENOL) 500 MG tablet Take 1,000 mg by mouth daily as needed for moderate pain or headache.   Yes [provider]  ALPRAZolam Duanne Moron) 1 MG tablet Take 1 mg by mouth 2 (two) times daily as needed for anxiety or sleep.    Yes [provider]  aspirin EC 81 MG tablet Take 1 tablet (81 mg total) by mouth daily. Patient taking differently: Take 81 mg by mouth daily after supper.  12/09/15  Yes [name], Erskine Squibb, MD  budesonide (PULMICORT) 0.25 MG/2ML nebulizer solution Inhale into the lungs. 12/11/19 12/10/20 Yes [provider]  citalopram (CELEXA) 10 MG tablet Take 10 mg by mouth daily.   Yes [provider]  docusate sodium (COLACE) 100 MG capsule Take 100 mg by mouth daily as needed (for constipation.).    Yes [provider]  ENTRESTO 24-26 MG Take 1 tablet by mouth 2 (two) times daily. 11/03/19  Yes Carline Dura, Otila Kluver A, FNP  fluticasone (FLONASE) 50 MCG/ACT nasal spray Place 2  sprays into both nostrils daily.   Yes [provider]  fluticasone-salmeterol (ADVAIR HFA) 115-21 MCG/ACT inhaler USE 2 INHALATIONS ORALLY EVERY 12 HOURS Patient taking differently: Inhale 2 puffs into the lungs every 12 (twelve) hours.  05/30/18  Yes Flora Lipps, MD  furosemide (LASIX) 20 MG tablet Take 2 tablets (40 mg total) by mouth daily. Patient taking differently: Take 20 mg by mouth daily.  10/29/19  Yes Lorella Nimrod, MD  gabapentin (NEURONTIN) 100 MG capsule Take 300 mg by mouth in the morning, at noon, and at bedtime. 09/20/19  Yes [provider]  guaiFENesin (MUCINEX) 600 MG 12 hr tablet Take 600 mg by mouth every evening.    Yes [provider]  ipratropium (ATROVENT) 0.03 % nasal spray Place 2 sprays into both nostrils 2 (two) times daily.    Yes [provider]  Ipratropium-Albuterol (COMBIVENT RESPIMAT) 20-100 MCG/ACT AERS respimat Inhale 1 puff into the lungs every 4 (four) hours.    Yes [provider]  levalbuterol  (XOPENEX) 1.25 MG/3ML nebulizer solution Take 1.25 mg (3 mLs total) by nebulization every 4 (four) hours. Patient taking differently: Take 3 mLs by nebulization every 4 (four) hours as needed for wheezing or shortness of breath.  10/23/16  Yes Theodoro Grist, MD  loratadine (CLARITIN) 10 MG tablet Take 10 mg by mouth at bedtime.   Yes [provider]  lovastatin (MEVACOR) 20 MG tablet Take 20 mg by mouth at bedtime.   Yes [provider]  metoprolol succinate (TOPROL-XL) 25 MG 24 hr tablet Take 25 mg by mouth daily.   Yes [provider]  nitroGLYCERIN (NITROSTAT) 0.4 MG SL tablet Place 0.4 mg under the tongue every 5 (five) minutes as needed for chest pain.   Yes [provider]  oxyCODONE-acetaminophen (PERCOCET) 7.5-325 MG tablet Take 1-2 tablets by mouth every 6 (six) hours as needed for moderate pain. 11/30/19  Yes Stegmayer, Janalyn Harder, PA-C  pantoprazole (PROTONIX) 40 MG tablet Take 40 mg by mouth daily before breakfast.    Yes [provider]  Respiratory Therapy Supplies (FLUTTER) DEVI Use as directed. 05/15/19  Yes Magdalen Spatz, NP  SPIRIVA RESPIMAT 1.25 MCG/ACT AERS INHALE 2 PUFFS BY MOUTH INTO THE LUNGS DAILY 05/29/19  Yes Flora Lipps, MD  tamsulosin (FLOMAX) 0.4 MG CAPS capsule Take 0.4 mg by mouth daily after supper.  03/07/18  Yes [provider]  vitamin B-12 (CYANOCOBALAMIN) 1000 MCG tablet Take 1,000 mcg by mouth daily.   Yes [provider]  warfarin (COUMADIN) 5 MG tablet Take 5 mg by mouth daily.   Yes [provider]  Wound Dressings (RESTORE WOUND CARE DRESSING) PADS Apply 1 each topically daily.  01/25/18  Yes [provider]    Review of Systems  Constitutional: Positive for fatigue (with little exertion). Negative for appetite change.  HENT: Positive for postnasal drip. Negative for congestion and sore throat.   Eyes: Negative.   Respiratory: Positive for cough and shortness of breath (with  little exertion).   Cardiovascular: Negative for chest pain, palpitations and leg swelling.  Gastrointestinal: Positive for constipation. Negative for abdominal distention and abdominal pain.  Endocrine: Negative.   Genitourinary: Negative.   Musculoskeletal: Positive for arthralgias (left leg). Negative for back pain.  Skin: Positive for wound (left BKA).  Allergic/Immunologic: Negative.   Neurological: Negative for dizziness and light-headedness.  Hematological: Negative for adenopathy. Does not bruise/bleed easily.  Psychiatric/Behavioral: Negative for dysphoric mood and sleep disturbance. The patient is not  nervous/anxious.    Vitals:   12/20/19 1338  BP: (!) 96/64  Pulse: 76  Resp: 16  SpO2: 99%  Weight: 146 lb (66.2 kg)  Height: 6\' 1"  (1.854 m)   Wt Readings from Last 3 Encounters:  12/20/19 146 lb (66.2 kg)  12/14/19 146 lb (66.2 kg)  11/22/19 165 lb (74.8 kg)   Lab Results  Component Value Date   CREATININE 0.70 11/30/2019   CREATININE 0.65 11/28/2019   CREATININE 0.56 (L) 11/27/2019    Physical Exam Vitals and nursing note reviewed.  Constitutional:      Appearance: He is well-developed.  HENT:     Head: Normocephalic and atraumatic.  Cardiovascular:     Rate and Rhythm: Normal rate and regular rhythm.  Pulmonary:     Effort: Pulmonary effort is normal. No respiratory distress.     Breath sounds: No wheezing or rales.  Abdominal:     Palpations: Abdomen is soft.     Tenderness: There is no abdominal tenderness.  Musculoskeletal:     Left lower leg: No tenderness. No edema.     Comments: BKA present  Skin:    General: Skin is warm and dry.     Comments: Left BKA wrapped in ACE bandage  Neurological:     General: No focal deficit present.     Mental Status: He is alert and oriented to person, place, and time.  Psychiatric:        Mood and Affect: Mood normal.        Behavior: Behavior normal.    Assessment & Plan:  1: Chronic heart failure with  preserved ejection fraction with structural changes- - NYHA class III - euvolemic today - unable to safely weigh as he is unable to weight-bear due to right AKA and left BKA - adds very little salt to his food and they are reading food labels for sodium content - edema has completely resolved so will decrease his furosemide to 20mg  daily (was 40mg ) and an additional 20mg  if needed; advised to write down on a calendar how often he takes the extra 20mg  - AICD present - saw cardiology Nehemiah Massed) 12/05/19 & returns November - BNP 10/25/19 was 525.4 - reports receiving both his COVID vaccines  2: HTN- - BP low; decreasing furosemide per above - saw PCP Edwina Barth) earlier today and returns October - BMP from 12/05/19 reviewed and showed sodium 131, potassium 4.0, creatinine 0.7 and GFR 114  3: COPD- - wearing oxgyen at 2- 2.5L around the clock - using inhaler and nebulizer - saw pulmonology Lanney Gins) 12/11/19 and returns October  4: Atrial fibrillation- - INR 12/12/19 was 2.3  5: PVD- - saw vascular Owens Shark) 12/14/19 - has right BKA and left AKA with healing wound   Medication list was reviewed.   Return in 7 months or sooner for any questions/problems before then.

## 2019-12-19 NOTE — Progress Notes (Signed)
Subjective:    Patient ID: Tyler [name], male    DOB: [birth year], 64 y.o.   MRN: [id number] Chief Complaint  Patient presents with  . Follow-up    wound check    Patient presents today after left lower extremity below-knee amputation due to nonhealing ulceration.  Patient has tolerated the surgery well.  Today the wound is intact with no areas of dehiscence.  There are some scabbed over areas in the medial and lateral portions.  The patient does report having a fall shortly after his surgery however no dehiscence occurred.  He does endorse having some drainage from the wound however nothing that is purulent.  He denies any fever, chills, nausea, vomiting or diarrhea.  Overall he has been doing well postsurgically.   Review of Systems  Respiratory:       Home O2  Skin: Positive for wound.  Neurological: Positive for weakness.  All other systems reviewed and are negative.      Objective:   Physical Exam Vitals reviewed.  HENT:     Head: Normocephalic.  Pulmonary:     Effort: Pulmonary effort is normal.     Comments: Home O2 Musculoskeletal:     Right Lower Extremity: Right leg is amputated below knee.     Left Lower Extremity: Left leg is amputated below knee.  Neurological:     Mental Status: He is alert and oriented to person, place, and time.  Psychiatric:        Mood and Affect: Mood normal.        Behavior: Behavior normal.        Thought Content: Thought content normal.        Judgment: Judgment normal.     BP (!) 95/62 (BP Location: Right Arm)   Pulse 79   Resp 16   Ht 6\' 1"  (1.854 m)   Wt 146 lb (66.2 kg)   BMI 19.26 kg/m   Past Medical History:  Diagnosis Date  . AICD (automatic cardioverter/defibrillator) present   . Anxiety   . Arthritis   . Atherosclerosis of artery of extremity with ulceration ([place]) 09/2019   left foot s/p toe amp requiring debridement and futher toe amputations.  . Atrial fibrillation ([place])   . Cervical spinal stenosis     with neuropathy  . CHF (congestive heart failure) ([place])   . Constipation   . COPD (chronic obstructive pulmonary disease) ([place])   . Coronary artery disease   . Depression   . Dyspnea   . Dysrhythmia    atrial fibrillation  . Emphysema of lung ([place])   . Factor 5 Leiden mutation, heterozygous ([place])    on coumadin  . GERD (gastroesophageal reflux disease)   . Hypertension   . Lung nodule seen on imaging study    being followed by dr. [name]. just watching it for last few years, without change  . Myocardial infarction [facility]) 2004   stent placed, pacemaker implanted 2005  . Osteomyelitis ([place]) 10/2019   left foot  . Oxygen dependent    requires 2L nasal prong oxygen 24 hours a day  . Peripheral vascular disease ([place])   . Presence of permanent cardiac pacemaker 2005,2018  . Sleep apnea    waiting to have sleep study. Used bipap while hospitalized and said it was great for him.    Social History   Socioeconomic History  . Marital status: Married    Spouse name: Bethena Roys  . Number of children: Not on file  .  Years of education: Not on file  . Highest education level: Not on file  Occupational History  . Occupation: no employment    Comment: disabled  Tobacco Use  . Smoking status: Former Smoker    Packs/day: 1.00    Years: 42.00    Pack years: 42.00    Types: Cigarettes    Quit date: 09/23/2013    Years since quitting: 6.2  . Smokeless tobacco: Never Used  Vaping Use  . Vaping Use: Never used  Substance and Sexual Activity  . Alcohol use: No    Comment: quit 6 years ago  . Drug use: No  . Sexual activity: Yes  Other Topics Concern  . Not on file  Social History Narrative   Patient lives with wife and a dog.   Social Determinants of Health   Financial Resource Strain:   . Difficulty of Paying Living Expenses:   Food Insecurity:   . Worried About Charity fundraiser in the Last Year:   . Arboriculturist in the Last Year:   Transportation Needs: No Transportation Needs   . Lack of Transportation (Medical): No  . Lack of Transportation (Non-Medical): No  Physical Activity:   . Days of Exercise per Week:   . Minutes of Exercise per Session:   Stress:   . Feeling of Stress :   Social Connections:   . Frequency of Communication with Friends and Family:   . Frequency of Social Gatherings with Friends and Family:   . Attends Religious Services:   . Active Member of Clubs or Organizations:   . Attends Archivist Meetings:   Marland Kitchen Marital Status:   Intimate Partner Violence:   . Fear of Current or Ex-Partner:   . Emotionally Abused:   Marland Kitchen Physically Abused:   . Sexually Abused:     Past Surgical History:  Procedure Laterality Date  . ABOVE KNEE LEG AMPUTATION Right    after below the knee amputation   . AMPUTATION Left 11/22/2019   Procedure: AMPUTATION BELOW KNEE;  Surgeon: Algernon Huxley, MD;  Location: [facility] ORS;  Service: Vascular;  Laterality: Left;  . AMPUTATION TOE Left 08/04/2019   Procedure: AMPUTATION TOE MPJ LEFT;  Surgeon: Samara Deist, DPM;  Location: [facility] OR[facility];  Service: Podiatry;  Laterality: Left;  . AMPUTATION TOE Left 10/06/2019   Procedure: AMPUTATION TOE MPJ T1,T2 LEFT;  Surgeon: Samara Deist, DPM;  Location: [facility] OR[facility];  Service: Podiatry;  Laterality: Left;  . APPLICATION OF WOUND VAC Left 01/12/2018   Procedure: APPLICATION OF WOUND VAC;  Surgeon: Algernon Huxley, MD;  Location: [facility] ORS;  Service: Vascular;  Laterality: Left;  . BELOW KNEE LEG AMPUTATION Right   . CATARACT EXTRACTION, BILATERAL Bilateral   . CORONARY ANGIOPLASTY  2005   stent x 1 placed  . ENDARTERECTOMY FEMORAL Left 08/11/2017   Procedure: ENDARTERECTOMY FEMORAL;  Surgeon: Algernon Huxley, MD;  Location: [facility] OR[facility];  Service: Vascular;  Laterality: Left;  . HEMATOMA EVACUATION Left 01/12/2018   Procedure: EVACUATION HEMATOMA ( DRAINING OF SEROMA);  Surgeon: Algernon Huxley, MD;  Location: [facility];  Service: Vascular;  Laterality: Left;  . IMPLANTABLE CARDIOVERTER  DEFIBRILLATOR (ICD) GENERATOR CHANGE Left 02/10/2017   Procedure: ICD GENERATOR CHANGE;  Surgeon: Isaias Cowman, MD;  Location: [facility];  Service: Cardiovascular;  Laterality: Left;  . INSERT / REPLACE / REMOVE PACEMAKER  [id number], 2018  . LOWER EXTREMITY ANGIOGRAPHY Left 06/07/2017   Procedure: LOWER EXTREMITY ANGIOGRAPHY;  Surgeon: Lucky Cowboy,  Erskine Squibb, MD;  Location: [place] CV [facility];  Service: Cardiovascular;  Laterality: Left;  . LOWER EXTREMITY ANGIOGRAPHY Left 10/05/2019   Procedure: LOWER EXTREMITY ANGIOGRAPHY;  Surgeon: Algernon Huxley, MD;  Location: [place] CV LAB;  Service: Cardiovascular;  Laterality: Left;  . LOWER EXTREMITY INTERVENTION  06/07/2017   Procedure: LOWER EXTREMITY INTERVENTION;  Surgeon: Algernon Huxley, MD;  Location: [place] CV LAB;  Service: Cardiovascular;;  . PERIPHERAL VASCULAR CATHETERIZATION Left 12/09/2015   Procedure: Lower Extremity Angiography;  Surgeon: Algernon Huxley, MD;  Location: [place] CV LAB;  Service: Cardiovascular;  Laterality: Left;  . PERIPHERAL VASCULAR CATHETERIZATION  12/09/2015   Procedure: Lower Extremity Intervention;  Surgeon: Algernon Huxley, MD;  Location: [place] CV LAB;  Service: Cardiovascular;;  . TONSILLECTOMY    . TRANSMETATARSAL AMPUTATION Left 10/20/2019   Procedure: TRANSMETATARSAL AMPUTATION;  Surgeon: Sharlotte Alamo, DPM;  Location: [facility];  Service: Podiatry;  Laterality: Left;    Family History  Problem Relation Age of Onset  . Cancer Mother   . Cancer Father   . Heart disease Father     Allergies  Allergen Reactions  . Apixaban Rash  . Rivaroxaban Rash       Assessment & Plan:   1. Amputation of left lower extremity below knee (HCC) Today all sutures removed with approximately half of the staples.  We will have the patient return in about a week or so to remove the rest of the staples.  Patient advised to contact our office to let us know if there are issues with the stump cardiovascular  follow-up.  2. 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  3. Mixed hyperlipidemia Continue statin as ordered and reviewed, no changes at this time    Current Outpatient Medications on File Prior to Visit  Medication Sig Dispense Refill  . acetaminophen (TYLENOL) 500 MG tablet Take 1,000 mg by mouth daily as needed for moderate pain or headache.    . ALPRAZolam (XANAX) 1 MG tablet Take 1 mg by mouth 2 (two) times daily as needed for anxiety or sleep.     Marland Kitchen aspirin EC 81 MG tablet Take 1 tablet (81 mg total) by mouth daily. (Patient taking differently: Take 81 mg by mouth daily after supper. ) 150 tablet 2  . budesonide (PULMICORT) 0.25 MG/2ML nebulizer solution Inhale into the lungs.    . citalopram (CELEXA) 10 MG tablet Take 10 mg by mouth daily.    Marland Kitchen docusate sodium (COLACE) 100 MG capsule Take 100 mg by mouth daily as needed (for constipation.).     Marland Kitchen ENTRESTO 24-26 MG Take 1 tablet by mouth 2 (two) times daily. 60 tablet 0  . fluticasone (FLONASE) 50 MCG/ACT nasal spray Place 2 sprays into both nostrils daily.    . fluticasone-salmeterol (ADVAIR HFA) 115-21 MCG/ACT inhaler USE 2 INHALATIONS ORALLY EVERY 12 HOURS (Patient taking differently: Inhale 2 puffs into the lungs every 12 (twelve) hours. ) 36 Inhaler 5  . furosemide (LASIX) 20 MG tablet Take 2 tablets (40 mg total) by mouth daily. (Patient taking differently: Take 20 mg by mouth daily. ) 60 tablet 0  . gabapentin (NEURONTIN) 100 MG capsule Take 300 mg by mouth in the morning, at noon, and at bedtime.    Marland Kitchen guaiFENesin (MUCINEX) 600 MG 12 hr tablet Take 600 mg by mouth every evening.     Marland Kitchen  ipratropium (ATROVENT) 0.03 % nasal spray Place 2 sprays into both nostrils 2 (two) times daily.     . Ipratropium-Albuterol (COMBIVENT RESPIMAT) 20-100 MCG/ACT AERS respimat Inhale 1 puff into the lungs every 4  (four) hours.     Marland Kitchen levalbuterol (XOPENEX) 1.25 MG/3ML nebulizer solution Take 1.25 mg (3 mLs total) by nebulization every 4 (four) hours. (Patient taking differently: Take 3 mLs by nebulization every 4 (four) hours as needed for wheezing or shortness of breath. ) 72 mL 12  . loratadine (CLARITIN) 10 MG tablet Take 10 mg by mouth at bedtime.    . lovastatin (MEVACOR) 20 MG tablet Take 20 mg by mouth at bedtime.    . metoprolol succinate (TOPROL-XL) 25 MG 24 hr tablet Take 25 mg by mouth daily.    . nitroGLYCERIN (NITROSTAT) 0.4 MG SL tablet Place 0.4 mg under the tongue every 5 (five) minutes as needed for chest pain.    Marland Kitchen oxyCODONE-acetaminophen (PERCOCET) 7.5-325 MG tablet Take 1-2 tablets by mouth every 6 (six) hours as needed for moderate pain. 50 tablet 0  . pantoprazole (PROTONIX) 40 MG tablet Take 40 mg by mouth daily before breakfast.     . Respiratory Therapy Supplies (FLUTTER) DEVI Use as directed. 1 each 0  . tamsulosin (FLOMAX) 0.4 MG CAPS capsule Take 0.4 mg by mouth daily after supper.   11  . vitamin B-12 (CYANOCOBALAMIN) 1000 MCG tablet Take 1,000 mcg by mouth daily.    Marland Kitchen warfarin (COUMADIN) 5 MG tablet Take 5 mg by mouth daily.    . Wound Dressings (RESTORE WOUND CARE DRESSING) PADS Apply 1 each topically daily.     Marland Kitchen amoxicillin-clavulanate (AUGMENTIN) 875-125 MG tablet Take 1 tablet by mouth every 12 (twelve) hours. (Patient not taking: Reported on 12/14/2019) 14 tablet 0  . predniSONE (DELTASONE) 50 MG tablet Take 1 tablet (50 mg total) by mouth daily with breakfast. (Patient not taking: Reported on 12/14/2019) 7 tablet 0  . SPIRIVA RESPIMAT 1.25 MCG/ACT AERS INHALE 2 PUFFS BY MOUTH INTO THE LUNGS DAILY (Patient not taking: Reported on 12/14/2019) 4 g 6   No current facility-administered medications on file prior to visit.    There are no Patient Instructions on file for this visit. No follow-ups on file.   Kris Hartmann, NP

## 2019-12-20 ENCOUNTER — Other Ambulatory Visit: Payer: Self-pay

## 2019-12-20 ENCOUNTER — Encounter: Payer: Self-pay | Admitting: Family

## 2019-12-20 ENCOUNTER — Ambulatory Visit: Payer: Medicare HMO | Attending: Family | Admitting: Family

## 2019-12-20 VITALS — BP 96/64 | HR 76 | Resp 16 | Ht 73.0 in | Wt 146.0 lb

## 2019-12-20 DIAGNOSIS — I5032 Chronic diastolic (congestive) heart failure: Secondary | ICD-10-CM | POA: Insufficient documentation

## 2019-12-20 DIAGNOSIS — J449 Chronic obstructive pulmonary disease, unspecified: Secondary | ICD-10-CM | POA: Diagnosis not present

## 2019-12-20 DIAGNOSIS — Z7982 Long term (current) use of aspirin: Secondary | ICD-10-CM | POA: Insufficient documentation

## 2019-12-20 DIAGNOSIS — G4730 Sleep apnea, unspecified: Secondary | ICD-10-CM | POA: Diagnosis not present

## 2019-12-20 DIAGNOSIS — F419 Anxiety disorder, unspecified: Secondary | ICD-10-CM | POA: Insufficient documentation

## 2019-12-20 DIAGNOSIS — D6851 Activated protein C resistance: Secondary | ICD-10-CM | POA: Insufficient documentation

## 2019-12-20 DIAGNOSIS — J431 Panlobular emphysema: Secondary | ICD-10-CM | POA: Diagnosis not present

## 2019-12-20 DIAGNOSIS — Z95810 Presence of automatic (implantable) cardiac defibrillator: Secondary | ICD-10-CM | POA: Diagnosis not present

## 2019-12-20 DIAGNOSIS — I2510 Atherosclerotic heart disease of native coronary artery without angina pectoris: Secondary | ICD-10-CM | POA: Insufficient documentation

## 2019-12-20 DIAGNOSIS — K5909 Other constipation: Secondary | ICD-10-CM | POA: Diagnosis not present

## 2019-12-20 DIAGNOSIS — I4891 Unspecified atrial fibrillation: Secondary | ICD-10-CM | POA: Insufficient documentation

## 2019-12-20 DIAGNOSIS — M1990 Unspecified osteoarthritis, unspecified site: Secondary | ICD-10-CM | POA: Diagnosis not present

## 2019-12-20 DIAGNOSIS — Z87891 Personal history of nicotine dependence: Secondary | ICD-10-CM | POA: Diagnosis not present

## 2019-12-20 DIAGNOSIS — Z7951 Long term (current) use of inhaled steroids: Secondary | ICD-10-CM | POA: Insufficient documentation

## 2019-12-20 DIAGNOSIS — I252 Old myocardial infarction: Secondary | ICD-10-CM | POA: Insufficient documentation

## 2019-12-20 DIAGNOSIS — J9611 Chronic respiratory failure with hypoxia: Secondary | ICD-10-CM | POA: Diagnosis not present

## 2019-12-20 DIAGNOSIS — Z9981 Dependence on supplemental oxygen: Secondary | ICD-10-CM | POA: Diagnosis not present

## 2019-12-20 DIAGNOSIS — Z79899 Other long term (current) drug therapy: Secondary | ICD-10-CM | POA: Insufficient documentation

## 2019-12-20 DIAGNOSIS — I739 Peripheral vascular disease, unspecified: Secondary | ICD-10-CM | POA: Insufficient documentation

## 2019-12-20 DIAGNOSIS — F329 Major depressive disorder, single episode, unspecified: Secondary | ICD-10-CM | POA: Diagnosis not present

## 2019-12-20 DIAGNOSIS — Z7901 Long term (current) use of anticoagulants: Secondary | ICD-10-CM | POA: Diagnosis not present

## 2019-12-20 DIAGNOSIS — I110 Hypertensive heart disease with heart failure: Secondary | ICD-10-CM | POA: Insufficient documentation

## 2019-12-20 DIAGNOSIS — Z89611 Acquired absence of right leg above knee: Secondary | ICD-10-CM | POA: Insufficient documentation

## 2019-12-20 DIAGNOSIS — K219 Gastro-esophageal reflux disease without esophagitis: Secondary | ICD-10-CM | POA: Insufficient documentation

## 2019-12-20 DIAGNOSIS — Z89512 Acquired absence of left leg below knee: Secondary | ICD-10-CM | POA: Insufficient documentation

## 2019-12-20 DIAGNOSIS — F320 Major depressive disorder, single episode, mild: Secondary | ICD-10-CM | POA: Diagnosis not present

## 2019-12-20 DIAGNOSIS — I10 Essential (primary) hypertension: Secondary | ICD-10-CM | POA: Diagnosis not present

## 2019-12-20 DIAGNOSIS — Z0001 Encounter for general adult medical examination with abnormal findings: Secondary | ICD-10-CM | POA: Diagnosis not present

## 2019-12-20 DIAGNOSIS — I480 Paroxysmal atrial fibrillation: Secondary | ICD-10-CM

## 2019-12-20 NOTE — Patient Instructions (Addendum)
  Decrease furosemide to 20mg  daily and take an additional 20mg  if needed.

## 2019-12-21 ENCOUNTER — Ambulatory Visit (INDEPENDENT_AMBULATORY_CARE_PROVIDER_SITE_OTHER): Payer: Medicare HMO | Admitting: Nurse Practitioner

## 2019-12-21 ENCOUNTER — Encounter (INDEPENDENT_AMBULATORY_CARE_PROVIDER_SITE_OTHER): Payer: Self-pay | Admitting: Nurse Practitioner

## 2019-12-21 ENCOUNTER — Other Ambulatory Visit: Payer: Self-pay

## 2019-12-21 VITALS — BP 104/69 | HR 79 | Resp 16

## 2019-12-21 DIAGNOSIS — E782 Mixed hyperlipidemia: Secondary | ICD-10-CM

## 2019-12-21 DIAGNOSIS — S88112A Complete traumatic amputation at level between knee and ankle, left lower leg, initial encounter: Secondary | ICD-10-CM

## 2019-12-21 DIAGNOSIS — I10 Essential (primary) hypertension: Secondary | ICD-10-CM

## 2019-12-22 DIAGNOSIS — J441 Chronic obstructive pulmonary disease with (acute) exacerbation: Secondary | ICD-10-CM | POA: Diagnosis not present

## 2019-12-22 DIAGNOSIS — J9610 Chronic respiratory failure, unspecified whether with hypoxia or hypercapnia: Secondary | ICD-10-CM | POA: Diagnosis not present

## 2019-12-23 DIAGNOSIS — J441 Chronic obstructive pulmonary disease with (acute) exacerbation: Secondary | ICD-10-CM | POA: Diagnosis not present

## 2019-12-25 ENCOUNTER — Encounter (INDEPENDENT_AMBULATORY_CARE_PROVIDER_SITE_OTHER): Payer: Self-pay | Admitting: Nurse Practitioner

## 2019-12-25 DIAGNOSIS — K219 Gastro-esophageal reflux disease without esophagitis: Secondary | ICD-10-CM | POA: Diagnosis not present

## 2019-12-25 DIAGNOSIS — M5000 Cervical disc disorder with myelopathy, unspecified cervical region: Secondary | ICD-10-CM | POA: Diagnosis not present

## 2019-12-25 DIAGNOSIS — K5909 Other constipation: Secondary | ICD-10-CM | POA: Diagnosis not present

## 2019-12-25 DIAGNOSIS — J441 Chronic obstructive pulmonary disease with (acute) exacerbation: Secondary | ICD-10-CM | POA: Diagnosis not present

## 2019-12-25 DIAGNOSIS — R1310 Dysphagia, unspecified: Secondary | ICD-10-CM | POA: Diagnosis not present

## 2019-12-25 DIAGNOSIS — Z86010 Personal history of colonic polyps: Secondary | ICD-10-CM | POA: Diagnosis not present

## 2019-12-25 NOTE — Progress Notes (Signed)
Subjective:    Patient ID: Tyler [name], male    DOB: January 29, 1956, 64 y.o.   MRN: [id number] Chief Complaint  Patient presents with  . Follow-up    staple removal    Patient presents today for staple removal from left below-knee amputation.  The wound has done well since the suture and staple removal last week.  The wound is intact with no areas of dehiscence.  No significant drainage.  No evidence of infection.  He denies any fever, chills, nausea, vomiting or diarrhea.  Overall doing well postsurgically.       Review of Systems  Respiratory:       Home 02  Skin: Positive for wound.  Neurological: Positive for weakness.  All other systems reviewed and are negative.      Objective:   Physical Exam Vitals reviewed.  HENT:     Head: Normocephalic.  Cardiovascular:     Rate and Rhythm: Normal rate.     Heart sounds: Normal heart sounds.  Pulmonary:     Effort: Pulmonary effort is normal.     Comments: Home 02 Skin:    General: Skin is warm and dry.  Neurological:     Mental Status: He is alert and oriented to person, place, and time.     Motor: Weakness present.  Psychiatric:        Mood and Affect: Mood normal.        Behavior: Behavior normal.        Thought Content: Thought content normal.        Judgment: Judgment normal.     BP 104/69 (BP Location: Right Arm)   Pulse 79   Resp 16   Past Medical History:  Diagnosis Date  . AICD (automatic cardioverter/defibrillator) present   . Anxiety   . Arthritis   . Atherosclerosis of artery of extremity with ulceration ([place]) 09/2019   left foot s/p toe amp requiring debridement and futher toe amputations.  . Atrial fibrillation ([place])   . Cervical spinal stenosis    with neuropathy  . CHF (congestive heart failure) ([place])   . Constipation   . COPD (chronic obstructive pulmonary disease) ([place])   . Coronary artery disease   . Depression   . Dyspnea   . Dysrhythmia    atrial fibrillation  . Emphysema of  lung ([place])   . Factor 5 Leiden mutation, heterozygous ([place])    on coumadin  . GERD (gastroesophageal reflux disease)   . Hypertension   . Lung nodule seen on imaging study    being followed by dr. [name]. just watching it for last few years, without change  . Myocardial infarction [facility]) 2004   stent placed, pacemaker implanted 2005  . Osteomyelitis ([place]) 10/2019   left foot  . Oxygen dependent    requires 2L nasal prong oxygen 24 hours a day  . Peripheral vascular disease ([place])   . Presence of permanent cardiac pacemaker 2005,2018  . Sleep apnea    waiting to have sleep study. Used bipap while hospitalized and said it was great for him.    Social History   Socioeconomic History  . Marital status: Married    Spouse name: Bethena Roys  . Number of children: Not on file  . Years of education: Not on file  . Highest education level: Not on file  Occupational History  . Occupation: no employment    Comment: disabled  Tobacco Use  . Smoking status: Former Smoker    Packs/day:  1.00    Years: 42.00    Pack years: 42.00    Types: Cigarettes    Quit date: 09/23/2013    Years since quitting: 6.2  . Smokeless tobacco: Never Used  Vaping Use  . Vaping Use: Never used  Substance and Sexual Activity  . Alcohol use: No    Comment: quit 6 years ago  . Drug use: No  . Sexual activity: Yes  Other Topics Concern  . Not on file  Social History Narrative   Patient lives with wife and a dog.   Social Determinants of Health   Financial Resource Strain:   . Difficulty of Paying Living Expenses:   Food Insecurity:   . Worried About Charity fundraiser in the Last Year:   . Arboriculturist in the Last Year:   Transportation Needs: No Transportation Needs  . Lack of Transportation (Medical): No  . Lack of Transportation (Non-Medical): No  Physical Activity:   . Days of Exercise per Week:   . Minutes of Exercise per Session:   Stress:   . Feeling of Stress :   Social Connections:   .  Frequency of Communication with Friends and Family:   . Frequency of Social Gatherings with Friends and Family:   . Attends Religious Services:   . Active Member of Clubs or Organizations:   . Attends Archivist Meetings:   Marland Kitchen Marital Status:   Intimate Partner Violence:   . Fear of Current or Ex-Partner:   . Emotionally Abused:   Marland Kitchen Physically Abused:   . Sexually Abused:     Past Surgical History:  Procedure Laterality Date  . ABOVE KNEE LEG AMPUTATION Right    after below the knee amputation   . AMPUTATION Left 11/22/2019   Procedure: AMPUTATION BELOW KNEE;  Surgeon: Algernon Huxley, MD;  Location: [facility] OR[facility];  Service: Vascular;  Laterality: Left;  . AMPUTATION TOE Left 08/04/2019   Procedure: AMPUTATION TOE MPJ LEFT;  Surgeon: Samara Deist, DPM;  Location: [facility];  Service: Podiatry;  Laterality: Left;  . AMPUTATION TOE Left 10/06/2019   Procedure: AMPUTATION TOE MPJ T1,T2 LEFT;  Surgeon: Samara Deist, DPM;  Location: [facility];  Service: Podiatry;  Laterality: Left;  . APPLICATION OF WOUND VAC Left 01/12/2018   Procedure: APPLICATION OF WOUND VAC;  Surgeon: Algernon Huxley, MD;  Location: [facility];  Service: Vascular;  Laterality: Left;  . BELOW KNEE LEG AMPUTATION Right   . CATARACT EXTRACTION, BILATERAL Bilateral   . CORONARY ANGIOPLASTY  2005   stent x 1 placed  . ENDARTERECTOMY FEMORAL Left 08/11/2017   Procedure: ENDARTERECTOMY FEMORAL;  Surgeon: Algernon Huxley, MD;  Location: [facility];  Service: Vascular;  Laterality: Left;  . HEMATOMA EVACUATION Left 01/12/2018   Procedure: EVACUATION HEMATOMA ( DRAINING OF SEROMA);  Surgeon: Algernon Huxley, MD;  Location: [facility];  Service: Vascular;  Laterality: Left;  . IMPLANTABLE CARDIOVERTER DEFIBRILLATOR (ICD) GENERATOR CHANGE Left 02/10/2017   Procedure: ICD GENERATOR CHANGE;  Surgeon: Isaias Cowman, MD;  Location: [facility];  Service: Cardiovascular;  Laterality: Left;  . INSERT / REPLACE / REMOVE PACEMAKER  [id number], 2018   . LOWER EXTREMITY ANGIOGRAPHY Left 06/07/2017   Procedure: LOWER EXTREMITY ANGIOGRAPHY;  Surgeon: Algernon Huxley, MD;  Location: [place] CV [facility];  Service: Cardiovascular;  Laterality: Left;  . LOWER EXTREMITY ANGIOGRAPHY Left 10/05/2019   Procedure: LOWER EXTREMITY ANGIOGRAPHY;  Surgeon: Algernon Huxley, MD;  Location: [place] CV LAB;  Service: Cardiovascular;  Laterality: Left;  . LOWER EXTREMITY INTERVENTION  06/07/2017   Procedure: LOWER EXTREMITY INTERVENTION;  Surgeon: Algernon Huxley, MD;  Location: [place] CV LAB;  Service: Cardiovascular;;  . PERIPHERAL VASCULAR CATHETERIZATION Left 12/09/2015   Procedure: Lower Extremity Angiography;  Surgeon: Algernon Huxley, MD;  Location: [place] CV LAB;  Service: Cardiovascular;  Laterality: Left;  . PERIPHERAL VASCULAR CATHETERIZATION  12/09/2015   Procedure: Lower Extremity Intervention;  Surgeon: Algernon Huxley, MD;  Location: [place] CV LAB;  Service: Cardiovascular;;  . TONSILLECTOMY    . TRANSMETATARSAL AMPUTATION Left 10/20/2019   Procedure: TRANSMETATARSAL AMPUTATION;  Surgeon: Sharlotte Alamo, DPM;  Location: [facility];  Service: Podiatry;  Laterality: Left;    Family History  Problem Relation Age of Onset  . Cancer Mother   . Cancer Father   . Heart disease Father     Allergies  Allergen Reactions  . Apixaban Rash  . Rivaroxaban Rash       Assessment & Plan:   1. Amputation of left lower extremity below knee (HCC) All the patient staples removed today.  Patient tolerated procedure well.  There are still some areas that are concerning for possible dehiscence however there is still intact at this time.  We will have the patient return in 6 weeks for wound evaluation, however there advised to contact us sooner if there are issues.  2. Essential hypertension, benign Continue antihypertensive medications as already ordered, these medications have been reviewed and there are no changes at this time.   3. Mixed  hyperlipidemia Continue statin as ordered and reviewed, no changes at this time    Current Outpatient Medications on File Prior to Visit  Medication Sig Dispense Refill  . acetaminophen (TYLENOL) 500 MG tablet Take 1,000 mg by mouth daily as needed for moderate pain or headache.    . ALPRAZolam (XANAX) 1 MG tablet Take 1 mg by mouth 2 (two) times daily as needed for anxiety or sleep.     Marland Kitchen aspirin EC 81 MG tablet Take 1 tablet (81 mg total) by mouth daily. (Patient taking differently: Take 81 mg by mouth daily after supper. ) 150 tablet 2  . budesonide (PULMICORT) 0.25 MG/2ML nebulizer solution Inhale into the lungs.    . citalopram (CELEXA) 10 MG tablet Take 10 mg by mouth daily.    Marland Kitchen docusate sodium (COLACE) 100 MG capsule Take 100 mg by mouth daily as needed (for constipation.).     Marland Kitchen ENTRESTO 24-26 MG Take 1 tablet by mouth 2 (two) times daily. 60 tablet 0  . fluticasone (FLONASE) 50 MCG/ACT nasal spray Place 2 sprays into both nostrils daily.    . fluticasone-salmeterol (ADVAIR HFA) 115-21 MCG/ACT inhaler USE 2 INHALATIONS ORALLY EVERY 12 HOURS (Patient taking differently: Inhale 2 puffs into the lungs every 12 (twelve) hours. ) 36 Inhaler 5  . furosemide (LASIX) 20 MG tablet Take 2 tablets (40 mg total) by mouth daily. (Patient taking differently: Take 20 mg by mouth daily. ) 60 tablet 0  . gabapentin (NEURONTIN) 100 MG capsule Take 300 mg by mouth in the morning, at noon, and at bedtime.    Marland Kitchen guaiFENesin (MUCINEX) 600 MG 12 hr tablet Take 600 mg by mouth every evening.     Marland Kitchen ipratropium (ATROVENT) 0.03 % nasal spray Place 2 sprays into both nostrils 2 (two) times daily.     . Ipratropium-Albuterol (COMBIVENT RESPIMAT) 20-100 MCG/ACT AERS respimat Inhale 1 puff into the lungs every 4 (four)  hours.     . levalbuterol (XOPENEX) 1.25 MG/3ML nebulizer solution Take 1.25 mg (3 mLs total) by nebulization every 4 (four) hours. (Patient taking differently: Take 3 mLs by nebulization every 4 (four)  hours as needed for wheezing or shortness of breath. ) 72 mL 12  . loratadine (CLARITIN) 10 MG tablet Take 10 mg by mouth at bedtime.    . lovastatin (MEVACOR) 20 MG tablet Take 20 mg by mouth at bedtime.    . metoprolol succinate (TOPROL-XL) 25 MG 24 hr tablet Take 25 mg by mouth daily.    . nitroGLYCERIN (NITROSTAT) 0.4 MG SL tablet Place 0.4 mg under the tongue every 5 (five) minutes as needed for chest pain.    Marland Kitchen oxyCODONE-acetaminophen (PERCOCET) 7.5-325 MG tablet Take 1-2 tablets by mouth every 6 (six) hours as needed for moderate pain. 50 tablet 0  . pantoprazole (PROTONIX) 40 MG tablet Take 40 mg by mouth daily before breakfast.     . Respiratory Therapy Supplies (FLUTTER) DEVI Use as directed. 1 each 0  . sildenafil (VIAGRA) 25 MG tablet Take by mouth.    . SPIRIVA RESPIMAT 1.25 MCG/ACT AERS INHALE 2 PUFFS BY MOUTH INTO THE LUNGS DAILY 4 g 6  . tamsulosin (FLOMAX) 0.4 MG CAPS capsule Take 0.4 mg by mouth daily after supper.   11  . vitamin B-12 (CYANOCOBALAMIN) 1000 MCG tablet Take 1,000 mcg by mouth daily.    Marland Kitchen warfarin (COUMADIN) 5 MG tablet Take 5 mg by mouth daily.    . Wound Dressings (RESTORE WOUND CARE DRESSING) PADS Apply 1 each topically daily.      No current facility-administered medications on file prior to visit.    There are no Patient Instructions on file for this visit. No follow-ups on file.   Kris Hartmann, NP

## 2019-12-27 ENCOUNTER — Telehealth: Payer: Self-pay | Admitting: Internal Medicine

## 2019-12-27 DIAGNOSIS — E785 Hyperlipidemia, unspecified: Secondary | ICD-10-CM | POA: Diagnosis not present

## 2019-12-27 DIAGNOSIS — M5000 Cervical disc disorder with myelopathy, unspecified cervical region: Secondary | ICD-10-CM | POA: Diagnosis not present

## 2019-12-27 DIAGNOSIS — J449 Chronic obstructive pulmonary disease, unspecified: Secondary | ICD-10-CM | POA: Diagnosis not present

## 2019-12-27 DIAGNOSIS — S51012D Laceration without foreign body of left elbow, subsequent encounter: Secondary | ICD-10-CM | POA: Diagnosis not present

## 2019-12-27 DIAGNOSIS — I110 Hypertensive heart disease with heart failure: Secondary | ICD-10-CM | POA: Diagnosis not present

## 2019-12-27 DIAGNOSIS — I480 Paroxysmal atrial fibrillation: Secondary | ICD-10-CM | POA: Diagnosis not present

## 2019-12-27 DIAGNOSIS — F418 Other specified anxiety disorders: Secondary | ICD-10-CM | POA: Diagnosis not present

## 2019-12-27 DIAGNOSIS — I739 Peripheral vascular disease, unspecified: Secondary | ICD-10-CM | POA: Diagnosis not present

## 2019-12-27 DIAGNOSIS — I5023 Acute on chronic systolic (congestive) heart failure: Secondary | ICD-10-CM | POA: Diagnosis not present

## 2019-12-27 NOTE — Telephone Encounter (Signed)
Pt has seen Dr. [name] and Eric Form, NP.  HST order was placed and patient was contacted to schedule. Pt stated at that time that he had to go back to the hospital for additional surgery and he wished to schedule after his hospital stay.  I contacted the patient today to schedule HST and CT Chest ordered by Sarah and the patient stated that he has changed physician's to [facility] - Dr. Lanney Gins.  Pt stated that his other physicians were with [name] and he decided to have all his doctors with [name].  I advised patient that since he was seeing Dr. Lanney Gins that we would cancel our outstanding orders which includes HST and CT Chest and Dr. Lanney Gins would be able to follow.  Pt agreed with this and thanked me for all my help.   Sent as FYI to Dr. [name] and Eric Form, NP. Rhonda J Cobb

## 2020-01-01 ENCOUNTER — Other Ambulatory Visit: Payer: Self-pay

## 2020-01-01 ENCOUNTER — Other Ambulatory Visit (INDEPENDENT_AMBULATORY_CARE_PROVIDER_SITE_OTHER): Payer: Self-pay | Admitting: Nurse Practitioner

## 2020-01-01 ENCOUNTER — Telehealth: Payer: Self-pay | Admitting: Acute Care

## 2020-01-01 ENCOUNTER — Other Ambulatory Visit: Payer: Medicare HMO | Admitting: Primary Care

## 2020-01-01 ENCOUNTER — Encounter (INDEPENDENT_AMBULATORY_CARE_PROVIDER_SITE_OTHER): Payer: Self-pay

## 2020-01-01 DIAGNOSIS — Z89611 Acquired absence of right leg above knee: Secondary | ICD-10-CM | POA: Diagnosis not present

## 2020-01-01 DIAGNOSIS — M5000 Cervical disc disorder with myelopathy, unspecified cervical region: Secondary | ICD-10-CM | POA: Diagnosis not present

## 2020-01-01 DIAGNOSIS — F418 Other specified anxiety disorders: Secondary | ICD-10-CM

## 2020-01-01 DIAGNOSIS — J449 Chronic obstructive pulmonary disease, unspecified: Secondary | ICD-10-CM | POA: Diagnosis not present

## 2020-01-01 DIAGNOSIS — Z515 Encounter for palliative care: Secondary | ICD-10-CM | POA: Diagnosis not present

## 2020-01-01 MED ORDER — MUPIROCIN 2 % EX OINT: 1.0000 "application " | TOPICAL_OINTMENT | Freq: Every day | CUTANEOUS | 0 refills | Status: DC

## 2020-01-01 NOTE — Telephone Encounter (Signed)
Thanks so much for letting us know!

## 2020-01-01 NOTE — Patient Outreach (Signed)
[facility]) Care Management  [place]  01/01/2020   Tyler [name] January 06, 1956 [id number]  Subjective: Telephone call to patient. He reports he is doing good.  He states that he is having some problems with hot feeling since starting Daliresp.  Advised patient that is not a side effect that CM is aware of.  Patient denies fever or another signs of infection that may explain hot flash feeling.  Advised patient that if it continues to bother him to notify physician. He states his stump site has a couple of scabbed areas that requires a daily dressing but area is free from infection symptoms.  He states he does get a couple of blood spots but nothing further.  Reiterated signs of infection and notification of physician. He verbalized understanding and voices no concerns.    Objective:   Encounter Medications:  Outpatient Encounter Medications as of 01/01/2020  Medication Sig Note  . acetaminophen (TYLENOL) 500 MG tablet Take 1,000 mg by mouth daily as needed for moderate pain or headache.   . ALPRAZolam (XANAX) 1 MG tablet Take 1 mg by mouth 2 (two) times daily as needed for anxiety or sleep.    Marland Kitchen aspirin EC 81 MG tablet Take 1 tablet (81 mg total) by mouth daily. (Patient taking differently: Take 81 mg by mouth daily after supper. )   . budesonide (PULMICORT) 0.25 MG/2ML nebulizer solution Inhale into the lungs.   . citalopram (CELEXA) 10 MG tablet Take 10 mg by mouth daily.   Marland Kitchen docusate sodium (COLACE) 100 MG capsule Take 100 mg by mouth daily as needed (for constipation.).    Marland Kitchen ENTRESTO 24-26 MG Take 1 tablet by mouth 2 (two) times daily.   . fluticasone (FLONASE) 50 MCG/ACT nasal spray Place 2 sprays into both nostrils daily.   . fluticasone-salmeterol (ADVAIR HFA) 115-21 MCG/ACT inhaler USE 2 INHALATIONS ORALLY EVERY 12 HOURS (Patient taking differently: Inhale 2 puffs into the lungs every 12 (twelve) hours. )   . furosemide (LASIX) 20 MG tablet Take 2 tablets (40  mg total) by mouth daily. (Patient taking differently: Take 20 mg by mouth daily. )   . gabapentin (NEURONTIN) 100 MG capsule Take 300 mg by mouth in the morning, at noon, and at bedtime.   Marland Kitchen guaiFENesin (MUCINEX) 600 MG 12 hr tablet Take 600 mg by mouth every evening.    Marland Kitchen ipratropium (ATROVENT) 0.03 % nasal spray Place 2 sprays into both nostrils 2 (two) times daily.    . Ipratropium-Albuterol (COMBIVENT RESPIMAT) 20-100 MCG/ACT AERS respimat Inhale 1 puff into the lungs every 4 (four) hours.  07/27/2019: Pt confirmed he takes every 4 hours  . levalbuterol (XOPENEX) 1.25 MG/3ML nebulizer solution Take 1.25 mg (3 mLs total) by nebulization every 4 (four) hours. (Patient taking differently: Take 3 mLs by nebulization every 4 (four) hours as needed for wheezing or shortness of breath. )   . loratadine (CLARITIN) 10 MG tablet Take 10 mg by mouth at bedtime.   . lovastatin (MEVACOR) 20 MG tablet Take 20 mg by mouth at bedtime.   . metoprolol succinate (TOPROL-XL) 25 MG 24 hr tablet Take 25 mg by mouth daily.   . nitroGLYCERIN (NITROSTAT) 0.4 MG SL tablet Place 0.4 mg under the tongue every 5 (five) minutes as needed for chest pain.   Marland Kitchen oxyCODONE-acetaminophen (PERCOCET) 7.5-325 MG tablet Take 1-2 tablets by mouth every 6 (six) hours as needed for moderate pain.   . pantoprazole (PROTONIX) 40 MG tablet Take  40 mg by mouth daily before breakfast.    . Respiratory Therapy Supplies (FLUTTER) DEVI Use as directed.   . sildenafil (VIAGRA) 25 MG tablet Take by mouth.   . SPIRIVA RESPIMAT 1.25 MCG/ACT AERS INHALE 2 PUFFS BY MOUTH INTO THE LUNGS DAILY   . tamsulosin (FLOMAX) 0.4 MG CAPS capsule Take 0.4 mg by mouth daily after supper.    . vitamin B-12 (CYANOCOBALAMIN) 1000 MCG tablet Take 1,000 mcg by mouth daily.   Marland Kitchen warfarin (COUMADIN) 5 MG tablet Take 5 mg by mouth daily.   . Wound Dressings (RESTORE WOUND CARE DRESSING) PADS Apply 1 each topically daily.     No facility-administered encounter medications  on file as of 01/01/2020.    Functional Status:  In your present state of health, do you have any difficulty performing the following activities: 12/20/2019 12/18/2019  Hearing? N N  Vision? N N  Difficulty concentrating or making decisions? N N  Walking or climbing stairs? Y Y  Comment - -  Dressing or bathing? Y N  Comment - -  Doing errands, shopping? N Y  Conservation officer, nature and eating ? - N  Using the Toilet? - N  In the past six months, have you accidently leaked urine? - N  Do you have problems with loss of bowel control? - N  Managing your Medications? - N  Managing your Finances? - Y  Comment - wife assists  Housekeeping or managing your Housekeeping? - Y  Comment - wife assists  Some recent data might be hidden    Fall/Depression Screening: Fall Risk  12/20/2019 12/01/2019 11/02/2019  Falls in the past year? 1 1 0  Number falls in past yr: 0 0 0  Injury with Fall? 0 0 0  Risk for fall due to : History of fall(s) - -  Follow up Falls evaluation completed - Falls evaluation completed   PHQ 2/9 Scores 12/01/2019 10/30/2019  PHQ - 2 Score 0 0    Assessment: Patient continues hospital recovery with assistance of spouse.    Plan:  [facility] CM Care Plan Problem One     Most Recent Value  Care Plan Problem One Post Hospitalization Left Below the Knee Amputation  Role Documenting the Problem One Care Management Telephonic Coordinator  Care Plan for Problem One Active  [facility] Long Term Goal  Patient will not experience unplanned hospital readmission within the next 31 days  [facility] Long Term Goal Start Date 12/01/19  Interventions for Problem One Long Term Goal Reiterated with patient signs of infection and importance of notifying physician for any changes.    [facility] CM Short Term Goal #1  Patient will actively participate in home health services within the next 14 days  [facility] CM Short Term Goal #1 Start Date 12/01/19  [facility] CM Short Term Goal #1 Met Date 12/18/19  [facility] CM Short Term Goal #2  Patient  will not experience signs of infection to Left BKA site within 30 days.  [facility] CM Short Term Goal #2 Start Date 12/01/19  [facility] CM Short Term Goal #2 Met Date 01/01/20     RN CM will contact patient again in the month of August and patient agreeable.  [name], RN, MSN [place] Management Care Management Coordinator Direct Line [phone] Cell [phone] Toll Free: [phone]  Fax: [phone]

## 2020-01-01 NOTE — Telephone Encounter (Addendum)
Spoke to patient and relayed below results.  Patient stated that he already sees GI and has colonoscopy and endo scheduled for 02/19/2020.  Will route to Judson Roch as an [employer]

## 2020-01-01 NOTE — Telephone Encounter (Signed)
PLAN/RECOMMENDATIONS:             A. Diet: Regular/Mech soft diet consistency(foods cut well, moistened); Thin liquids VIA CUP for control when drinking. SMALL sips/ bites SLOWLY during all oral intake. Pills Whole in PUREE for safe, controlled swallowing             B. Swallowing Precautions: general aspiration precautions including f/u, DRY swallows intermittently during/post oral intake; REFLUX precautions - Baseline GERD             C. Recommended consultation to: GI for formal assessment and management of any Esophageal dysmotility, Reflux issues             D. Therapy recommendations: None at this time             E. Results and recommendations were discussed w/ pt and Wife after study; video viewed and questions answered. Recommendations were discussed w/ pt verbalizing understanding. Handouts given.    Dysphagia, pharyngoesophageal phase  Margie, Can you please send a referral for GI consult  for formal assessment and management of any Esophageal dysmotility, Reflux issues. Thanks so much

## 2020-01-01 NOTE — Progress Notes (Signed)
[place] Consult Note Telephone: [phone]  Fax: [phone]  PATIENT NAME: Tyler [name] [address] [place] [address] [phone] (home)  DOB: May 10, 1956 MRN: [id number]  PRIMARY CARE PROVIDER:    [name], MD,  [place] [address] [phone]  REFERRING PROVIDER:   [name], MD [place],  [place] [address] [phone]  RESPONSIBLE PARTY:   Extended Emergency Contact Information Primary Emergency Contact: Reuben, [place] Address: [place],  [address] [address] [place] Phone: [phone] Mobile Phone: [phone] Relation: Spouse Secondary Emergency Contact: Thomasenia Bottoms Mobile Phone: [phone] Relation: Sister  I met face to face with patient and family in home.  ASSESSMENT AND RECOMMENDATIONS:   1. Advance Care Planning/Goals of Care: Goals include to maximize quality of life and symptom management. Our advance care planning conversation included a discussion about:     The value and importance of advance care planning   Exploration of personal, cultural or spiritual beliefs that might influence medical decisions   Exploration of goals of care in the event of a sudden injury or illness   Identification and preparation of a healthcare agent - wife  Review and updating  of an  advance directive document .  We reviewed current MOST dated 2018 and he has no changes, asking for full code attempt, full scope of care with no feeding tube.   2. Symptom Management:   I met with Mr. Johnsie Cancel in his home with his wife present. I have not seen him for some months as he has been in and out of the hospital with amputations. 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   Dyspnea: We discussed medication management and I provided education regarding actions and side effects. He does report some  evening hot flashes which seem to start with the [place] did. He said they are not too uncomfortable and he has heard good things about the medication so will continue to take it for now.   We discussed his respiration status (dyspnea) and use of Lasix. Since he no longer has lower extremities we discussed signs and symptoms of possible congestive heart failure, central edema, wheezing and DOE. He is also using the trilogy NIV now which he thinks makes him feel better and will discuss humidification with the respiratory therapist. Using oxygen at 2 L ATC.  Wound: I examine his wound today; he reports falling on it a month ago. He says that the center area which is 1 cm round, slightly erythemic with slough in 50% of bed is actually advancing.Tender to touch.  I instructed him to call the vascular surgeon and report this. Meanwhile he did have mupirocin in the home so I ordered him to use that with wound dressing changes until it could be assessed by the vascular surgeon.   Depression: Over overall was in good spirits during our visit but did endorse depression and anxiety over his health. Wife also reports that her son recently has lost both of his lower limbs at the age of 12 due to peripheral vascular disease and diabetes.   Moderate caregiver strain. They have the adaptive equipment that they feel is useful and seem to be coping well with the limitations. I will follow up in eight weeks and instructed patient to firm back if needed prior.  Medication management:  Reviewed list of meds and medical education done RE daliresp and inhalers. Has pain management contract managed by PCP. Possible hot flashes in the evenings possibly due to daliresp.   3. Follow up Palliative Care Visit: Palliative care will continue to follow for goals of care clarification and symptom management. Return 8 weeks or prn.  4. Family /Caregiver/Community Supports: [place] Known to several  specialists.  5. Cognitive / Functional decline: A and O x 3. Limited by loss of LE. Able to transfer with sliding board.  I spent 60 minutes providing this consultation,  from 1300 to 1400. More than 50% of the time in this consultation was spent coordinating communication.   CHIEF COMPLAINT: wound to left BKA stump, DOE, goals of care review  HISTORY OF PRESENT ILLNESS:  Tyler [name] is a 64 y.o. year old male with multiple medical problems including Bil amputee, R AKA, L BKA. PVD, COPD, oxygen dependent, CHF. Palliative Care was asked to follow this patient by consultation request of [name], MD to help address advance care planning and goals of care. This is a follow up visit.  CODE STATUS: FULL CODE  PPS: 50%  HOSPICE ELIGIBILITY/DIAGNOSIS: no  PAST MEDICAL HISTORY:  Past Medical History:  Diagnosis Date  . AICD (automatic cardioverter/defibrillator) present   . Anxiety   . Arthritis   . Atherosclerosis of artery of extremity with ulceration ([place]) 09/2019   left foot s/p toe amp requiring debridement and futher toe amputations.  . Atrial fibrillation ([place])   . Cervical spinal stenosis    with neuropathy  . CHF (congestive heart failure) ([place])   . Constipation   . COPD (chronic obstructive pulmonary disease) ([place])   . Coronary artery disease   . Depression   . Dyspnea   . Dysrhythmia    atrial fibrillation  . Emphysema of lung ([place])   . Factor 5 Leiden mutation, heterozygous ([place])    on coumadin  . GERD (gastroesophageal reflux disease)   . Hypertension   . Lung nodule seen on imaging study    being followed by dr. [name]. just watching it for last few years, without change  . Myocardial infarction [facility]) 2004   stent placed, pacemaker implanted 2005  . Osteomyelitis ([place]) 10/2019   left foot  . Oxygen dependent    requires 2L nasal prong oxygen 24 hours a day  . Peripheral vascular disease ([place])   . Presence of permanent cardiac pacemaker 2005,2018  .  Sleep apnea    waiting to have sleep study. Used bipap while hospitalized and said it was great for him.    SOCIAL HX:  Social History   Tobacco Use  . Smoking status: Former Smoker    Packs/day: 1.00    Years: 42.00    Pack years: 42.00    Types: Cigarettes    Quit date: 09/23/2013    Years since quitting: 6.2  . Smokeless tobacco: Never Used  Substance Use Topics  . Alcohol use: No    Comment: quit 6 years ago   FAMILY HX:  Family History  Problem Relation Age of Onset  . Cancer Mother   . Cancer Father   . Heart disease Father     ALLERGIES:  Allergies  Allergen Reactions  . Apixaban Rash  . Rivaroxaban Rash     PERTINENT MEDICATIONS:  Outpatient Encounter Medications as of 01/01/2020  Medication Sig  . acetaminophen (TYLENOL) 500 MG tablet Take 1,000 mg by  mouth daily as needed for moderate pain or headache.  . ALPRAZolam (XANAX) 1 MG tablet Take 1 mg by mouth 2 (two) times daily as needed for anxiety or sleep.   Marland Kitchen aspirin EC 81 MG tablet Take 1 tablet (81 mg total) by mouth daily. (Patient taking differently: Take 81 mg by mouth daily after supper. )  . budesonide (PULMICORT) 0.25 MG/2ML nebulizer solution Inhale into the lungs.  . citalopram (CELEXA) 10 MG tablet Take 10 mg by mouth daily.  Marland Kitchen docusate sodium (COLACE) 100 MG capsule Take 100 mg by mouth daily as needed (for constipation.).   Marland Kitchen ENTRESTO 24-26 MG Take 1 tablet by mouth 2 (two) times daily.  . fluticasone (FLONASE) 50 MCG/ACT nasal spray Place 2 sprays into both nostrils daily.  . fluticasone-salmeterol (ADVAIR HFA) 115-21 MCG/ACT inhaler USE 2 INHALATIONS ORALLY EVERY 12 HOURS (Patient taking differently: Inhale 2 puffs into the lungs every 12 (twelve) hours. )  . furosemide (LASIX) 20 MG tablet Take 2 tablets (40 mg total) by mouth daily. (Patient taking differently: Take 20 mg by mouth daily. )  . gabapentin (NEURONTIN) 100 MG capsule Take 300 mg by mouth in the morning, at noon, and at bedtime.  Marland Kitchen  guaiFENesin (MUCINEX) 600 MG 12 hr tablet Take 600 mg by mouth every evening.   Marland Kitchen ipratropium (ATROVENT) 0.03 % nasal spray Place 2 sprays into both nostrils 2 (two) times daily.   . Ipratropium-Albuterol (COMBIVENT RESPIMAT) 20-100 MCG/ACT AERS respimat Inhale 1 puff into the lungs every 4 (four) hours.   Marland Kitchen levalbuterol (XOPENEX) 1.25 MG/3ML nebulizer solution Take 1.25 mg (3 mLs total) by nebulization every 4 (four) hours. (Patient taking differently: Take 3 mLs by nebulization every 4 (four) hours as needed for wheezing or shortness of breath. )  . loratadine (CLARITIN) 10 MG tablet Take 10 mg by mouth at bedtime.  . lovastatin (MEVACOR) 20 MG tablet Take 20 mg by mouth at bedtime.  . metoprolol succinate (TOPROL-XL) 25 MG 24 hr tablet Take 25 mg by mouth daily.  . nitroGLYCERIN (NITROSTAT) 0.4 MG SL tablet Place 0.4 mg under the tongue every 5 (five) minutes as needed for chest pain.  Marland Kitchen oxyCODONE-acetaminophen (PERCOCET) 7.5-325 MG tablet Take 1-2 tablets by mouth every 6 (six) hours as needed for moderate pain.  . pantoprazole (PROTONIX) 40 MG tablet Take 40 mg by mouth daily before breakfast.   . Respiratory Therapy Supplies (FLUTTER) DEVI Use as directed.  . roflumilast (DALIRESP) 500 MCG TABS tablet Take 500 mcg by mouth daily.  . sildenafil (VIAGRA) 25 MG tablet Take by mouth.  . SPIRIVA RESPIMAT 1.25 MCG/ACT AERS INHALE 2 PUFFS BY MOUTH INTO THE LUNGS DAILY  . tamsulosin (FLOMAX) 0.4 MG CAPS capsule Take 0.4 mg by mouth daily after supper.   . vitamin B-12 (CYANOCOBALAMIN) 1000 MCG tablet Take 1,000 mcg by mouth daily.  Marland Kitchen warfarin (COUMADIN) 5 MG tablet Take 5 mg by mouth daily.  . Wound Dressings (RESTORE WOUND CARE DRESSING) PADS Apply 1 each topically daily.    No facility-administered encounter medications on file as of 01/01/2020.      PHYSICAL EXAM / ROS:   Current and past weights: 148 lbs per hospital  General: NAD, frail appearing, thin Cardiovascular: no chest pain  reported, has ntg,  no edema (Bil amputee) Pulmonary: no cough, no increased SOB,oxygen 2 l cont, uses NIV at hs. Abdomen: appetite good, eats 2 meals a day, denies constipation, continent of bowel GU: denies dysuria, continent of urine  MSK:  ++ joint and ROM abnormalities. Non ambulatory L BKA, R AKA. Skin: 1 cm round lesion, erythremia, and 50% slough, tender Neurological: Weakness, reports neuropathy pain, usually 4/10  Jason Coop, NP , DNP, MPH, [facility]  COVID-19 PATIENT SCREENING TOOL  Person answering questions: __________ Self _______ _____   1.  Is the patient or any family member in the home showing any signs or symptoms regarding respiratory infection?               Person with Symptom- __________NA_________________  a. Fever                                                                          Yes___ No___          ___________________  b. Shortness of breath                                                    Yes___ No___          ___________________ c. Cough/congestion                                       Yes___  No___         ___________________ d. Body aches/pains                                                         Yes___ No___        ____________________ e. Gastrointestinal symptoms (diarrhea, nausea)           Yes___ No___        ____________________  2. Within the past 14 days, has anyone living in the home had any contact with someone with or under investigation for COVID-19?    Yes___ No_X_   Person __________________

## 2020-01-01 NOTE — Telephone Encounter (Signed)
Noted  

## 2020-01-02 DIAGNOSIS — E785 Hyperlipidemia, unspecified: Secondary | ICD-10-CM | POA: Diagnosis not present

## 2020-01-02 DIAGNOSIS — I739 Peripheral vascular disease, unspecified: Secondary | ICD-10-CM | POA: Diagnosis not present

## 2020-01-02 DIAGNOSIS — M5000 Cervical disc disorder with myelopathy, unspecified cervical region: Secondary | ICD-10-CM | POA: Diagnosis not present

## 2020-01-02 DIAGNOSIS — I5023 Acute on chronic systolic (congestive) heart failure: Secondary | ICD-10-CM | POA: Diagnosis not present

## 2020-01-02 DIAGNOSIS — S51012D Laceration without foreign body of left elbow, subsequent encounter: Secondary | ICD-10-CM | POA: Diagnosis not present

## 2020-01-02 DIAGNOSIS — F418 Other specified anxiety disorders: Secondary | ICD-10-CM | POA: Diagnosis not present

## 2020-01-02 DIAGNOSIS — I110 Hypertensive heart disease with heart failure: Secondary | ICD-10-CM | POA: Diagnosis not present

## 2020-01-02 DIAGNOSIS — I480 Paroxysmal atrial fibrillation: Secondary | ICD-10-CM | POA: Diagnosis not present

## 2020-01-02 DIAGNOSIS — J449 Chronic obstructive pulmonary disease, unspecified: Secondary | ICD-10-CM | POA: Diagnosis not present

## 2020-01-02 NOTE — Telephone Encounter (Signed)
Thanks so much Tyler [name]. 

## 2020-01-03 DIAGNOSIS — M5000 Cervical disc disorder with myelopathy, unspecified cervical region: Secondary | ICD-10-CM | POA: Diagnosis not present

## 2020-01-03 DIAGNOSIS — I110 Hypertensive heart disease with heart failure: Secondary | ICD-10-CM | POA: Diagnosis not present

## 2020-01-03 DIAGNOSIS — E785 Hyperlipidemia, unspecified: Secondary | ICD-10-CM | POA: Diagnosis not present

## 2020-01-03 DIAGNOSIS — J449 Chronic obstructive pulmonary disease, unspecified: Secondary | ICD-10-CM | POA: Diagnosis not present

## 2020-01-03 DIAGNOSIS — S51012D Laceration without foreign body of left elbow, subsequent encounter: Secondary | ICD-10-CM | POA: Diagnosis not present

## 2020-01-03 DIAGNOSIS — I480 Paroxysmal atrial fibrillation: Secondary | ICD-10-CM | POA: Diagnosis not present

## 2020-01-03 DIAGNOSIS — F418 Other specified anxiety disorders: Secondary | ICD-10-CM | POA: Diagnosis not present

## 2020-01-03 DIAGNOSIS — I5023 Acute on chronic systolic (congestive) heart failure: Secondary | ICD-10-CM | POA: Diagnosis not present

## 2020-01-03 DIAGNOSIS — I739 Peripheral vascular disease, unspecified: Secondary | ICD-10-CM | POA: Diagnosis not present

## 2020-01-05 DIAGNOSIS — I480 Paroxysmal atrial fibrillation: Secondary | ICD-10-CM | POA: Diagnosis not present

## 2020-01-05 DIAGNOSIS — I5023 Acute on chronic systolic (congestive) heart failure: Secondary | ICD-10-CM | POA: Diagnosis not present

## 2020-01-05 DIAGNOSIS — F418 Other specified anxiety disorders: Secondary | ICD-10-CM | POA: Diagnosis not present

## 2020-01-05 DIAGNOSIS — M5000 Cervical disc disorder with myelopathy, unspecified cervical region: Secondary | ICD-10-CM | POA: Diagnosis not present

## 2020-01-05 DIAGNOSIS — J449 Chronic obstructive pulmonary disease, unspecified: Secondary | ICD-10-CM | POA: Diagnosis not present

## 2020-01-05 DIAGNOSIS — E785 Hyperlipidemia, unspecified: Secondary | ICD-10-CM | POA: Diagnosis not present

## 2020-01-05 DIAGNOSIS — S51012D Laceration without foreign body of left elbow, subsequent encounter: Secondary | ICD-10-CM | POA: Diagnosis not present

## 2020-01-05 DIAGNOSIS — I110 Hypertensive heart disease with heart failure: Secondary | ICD-10-CM | POA: Diagnosis not present

## 2020-01-05 DIAGNOSIS — I739 Peripheral vascular disease, unspecified: Secondary | ICD-10-CM | POA: Diagnosis not present

## 2020-01-09 DIAGNOSIS — I739 Peripheral vascular disease, unspecified: Secondary | ICD-10-CM | POA: Diagnosis not present

## 2020-01-09 DIAGNOSIS — M5000 Cervical disc disorder with myelopathy, unspecified cervical region: Secondary | ICD-10-CM | POA: Diagnosis not present

## 2020-01-09 DIAGNOSIS — S51012D Laceration without foreign body of left elbow, subsequent encounter: Secondary | ICD-10-CM | POA: Diagnosis not present

## 2020-01-09 DIAGNOSIS — F418 Other specified anxiety disorders: Secondary | ICD-10-CM | POA: Diagnosis not present

## 2020-01-09 DIAGNOSIS — E785 Hyperlipidemia, unspecified: Secondary | ICD-10-CM | POA: Diagnosis not present

## 2020-01-09 DIAGNOSIS — I480 Paroxysmal atrial fibrillation: Secondary | ICD-10-CM | POA: Diagnosis not present

## 2020-01-09 DIAGNOSIS — J449 Chronic obstructive pulmonary disease, unspecified: Secondary | ICD-10-CM | POA: Diagnosis not present

## 2020-01-09 DIAGNOSIS — I110 Hypertensive heart disease with heart failure: Secondary | ICD-10-CM | POA: Diagnosis not present

## 2020-01-09 DIAGNOSIS — I5023 Acute on chronic systolic (congestive) heart failure: Secondary | ICD-10-CM | POA: Diagnosis not present

## 2020-01-11 DIAGNOSIS — I482 Chronic atrial fibrillation, unspecified: Secondary | ICD-10-CM | POA: Diagnosis not present

## 2020-01-15 ENCOUNTER — Other Ambulatory Visit: Payer: Self-pay

## 2020-01-15 NOTE — Patient Outreach (Signed)
[facility]) Care Management  01/15/2020  TUCK DULWORTH [birth year] [id number]   Telephone call to patient for up.   No answer.  HIPAA compliant voice message left.    Plan: If no return call, RN CM will attempt patient again within 4 business days.  [name], RN, MSN [place] Management Care Management Coordinator Direct Line [phone] Cell [phone] Toll Free: [phone]  Fax: [phone]

## 2020-01-17 ENCOUNTER — Other Ambulatory Visit: Payer: Self-pay

## 2020-01-17 DIAGNOSIS — I110 Hypertensive heart disease with heart failure: Secondary | ICD-10-CM | POA: Diagnosis not present

## 2020-01-17 DIAGNOSIS — E785 Hyperlipidemia, unspecified: Secondary | ICD-10-CM | POA: Diagnosis not present

## 2020-01-17 DIAGNOSIS — I480 Paroxysmal atrial fibrillation: Secondary | ICD-10-CM | POA: Diagnosis not present

## 2020-01-17 DIAGNOSIS — I5023 Acute on chronic systolic (congestive) heart failure: Secondary | ICD-10-CM | POA: Diagnosis not present

## 2020-01-17 DIAGNOSIS — M5000 Cervical disc disorder with myelopathy, unspecified cervical region: Secondary | ICD-10-CM | POA: Diagnosis not present

## 2020-01-17 DIAGNOSIS — F418 Other specified anxiety disorders: Secondary | ICD-10-CM | POA: Diagnosis not present

## 2020-01-17 DIAGNOSIS — I739 Peripheral vascular disease, unspecified: Secondary | ICD-10-CM | POA: Diagnosis not present

## 2020-01-17 DIAGNOSIS — S51012D Laceration without foreign body of left elbow, subsequent encounter: Secondary | ICD-10-CM | POA: Diagnosis not present

## 2020-01-17 DIAGNOSIS — J449 Chronic obstructive pulmonary disease, unspecified: Secondary | ICD-10-CM | POA: Diagnosis not present

## 2020-01-17 NOTE — Patient Outreach (Signed)
[place] [facility]) Care Management  [facility] Care Manager  01/17/2020   Tyler [name] 12/31/1955 [id number]  Subjective: Spoke with wife Bethena Roys for follow up. She reports patient doing really good right now. She states that he does has a small area to left stump site.  They are using dressing home health provided and area is looking better.  She states nurse is due for a visit today.  Discussed signs of infection.  She states his breathing is doing ok. He has not complained about his breathing. She voices no concerns.    Objective:   Encounter Medications:  Outpatient Encounter Medications as of 01/17/2020  Medication Sig Note  . acetaminophen (TYLENOL) 500 MG tablet Take 1,000 mg by mouth daily as needed for moderate pain or headache.   . ALPRAZolam (XANAX) 1 MG tablet Take 1 mg by mouth 2 (two) times daily as needed for anxiety or sleep.    Marland Kitchen aspirin EC 81 MG tablet Take 1 tablet (81 mg total) by mouth daily. (Patient taking differently: Take 81 mg by mouth daily after supper. )   . budesonide (PULMICORT) 0.25 MG/2ML nebulizer solution Inhale into the lungs.   . citalopram (CELEXA) 10 MG tablet Take 10 mg by mouth daily.   Marland Kitchen docusate sodium (COLACE) 100 MG capsule Take 100 mg by mouth daily as needed (for constipation.).    Marland Kitchen ENTRESTO 24-26 MG Take 1 tablet by mouth 2 (two) times daily.   . fluticasone (FLONASE) 50 MCG/ACT nasal spray Place 2 sprays into both nostrils daily.   . fluticasone-salmeterol (ADVAIR HFA) 115-21 MCG/ACT inhaler USE 2 INHALATIONS ORALLY EVERY 12 HOURS (Patient taking differently: Inhale 2 puffs into the lungs every 12 (twelve) hours. )   . furosemide (LASIX) 20 MG tablet Take 2 tablets (40 mg total) by mouth daily. (Patient taking differently: Take 20 mg by mouth daily. )   . gabapentin (NEURONTIN) 100 MG capsule Take 300 mg by mouth in the morning, at noon, and at bedtime.   Marland Kitchen guaiFENesin (MUCINEX) 600 MG 12 hr tablet Take 600 mg by mouth every evening.     Marland Kitchen ipratropium (ATROVENT) 0.03 % nasal spray Place 2 sprays into both nostrils 2 (two) times daily.    . Ipratropium-Albuterol (COMBIVENT RESPIMAT) 20-100 MCG/ACT AERS respimat Inhale 1 puff into the lungs every 4 (four) hours.  07/27/2019: Pt confirmed he takes every 4 hours  . levalbuterol (XOPENEX) 1.25 MG/3ML nebulizer solution Take 1.25 mg (3 mLs total) by nebulization every 4 (four) hours. (Patient taking differently: Take 3 mLs by nebulization every 4 (four) hours as needed for wheezing or shortness of breath. )   . loratadine (CLARITIN) 10 MG tablet Take 10 mg by mouth at bedtime.   . lovastatin (MEVACOR) 20 MG tablet Take 20 mg by mouth at bedtime.   . metoprolol succinate (TOPROL-XL) 25 MG 24 hr tablet Take 25 mg by mouth daily.   . mupirocin ointment (BACTROBAN) 2 % Apply 1 application topically daily.   . nitroGLYCERIN (NITROSTAT) 0.4 MG SL tablet Place 0.4 mg under the tongue every 5 (five) minutes as needed for chest pain.   Marland Kitchen oxyCODONE-acetaminophen (PERCOCET) 7.5-325 MG tablet Take 1-2 tablets by mouth every 6 (six) hours as needed for moderate pain.   . pantoprazole (PROTONIX) 40 MG tablet Take 40 mg by mouth daily before breakfast.    . Respiratory Therapy Supplies (FLUTTER) DEVI Use as directed.   . roflumilast (DALIRESP) 500 MCG TABS tablet Take 500 mcg by  mouth daily.   . sildenafil (VIAGRA) 25 MG tablet Take by mouth.   . SPIRIVA RESPIMAT 1.25 MCG/ACT AERS INHALE 2 PUFFS BY MOUTH INTO THE LUNGS DAILY   . tamsulosin (FLOMAX) 0.4 MG CAPS capsule Take 0.4 mg by mouth daily after supper.    . vitamin B-12 (CYANOCOBALAMIN) 1000 MCG tablet Take 1,000 mcg by mouth daily.   Marland Kitchen warfarin (COUMADIN) 5 MG tablet Take 5 mg by mouth daily.   . Wound Dressings (RESTORE WOUND CARE DRESSING) PADS Apply 1 each topically daily.     No facility-administered encounter medications on file as of 01/17/2020.    Functional Status:  In your present state of health, do you have any difficulty  performing the following activities: 12/20/2019 12/18/2019  Hearing? N N  Vision? N N  Difficulty concentrating or making decisions? N N  Walking or climbing stairs? Y Y  Comment - -  Dressing or bathing? Y N  Comment - -  Doing errands, shopping? N Y  Conservation officer, nature and eating ? - N  Using the Toilet? - N  In the past six months, have you accidently leaked urine? - N  Do you have problems with loss of bowel control? - N  Managing your Medications? - N  Managing your Finances? - Y  Comment - wife assists  Housekeeping or managing your Housekeeping? - Y  Comment - wife assists  Some recent data might be hidden    Fall/Depression Screening: Fall Risk  12/20/2019 12/01/2019 11/02/2019  Falls in the past year? 1 1 0  Number falls in past yr: 0 0 0  Injury with Fall? 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 Patient following up with physicians as scheduled.     Plan:  [facility] CM Care Plan Problem One     Most Recent Value  Care Plan Problem One Post Hospitalization Left Below the Knee Amputation  Role Documenting the Problem One Care Management Telephonic Coordinator  Care Plan for Problem One Active  [facility]N Long Term Goal  Patient will not experience unplanned hospital readmission within the next 31 days  THN Long Term Goal Start Date 12/01/19  [facility] Long Term Goal Met Date 01/17/20  Interventions for Problem One Long Term Goal Patient left stump site healing eith one smal open area.  No signs of infection.  Reiterated signs of infection.    THN CM Short Term Goal #1  Over the next 30 days, patient will demonstrate and/or verbalize understanding of self-health management for long term care of COPD.   THN CM Short Term Goal #1 Start Date 01/17/20  Interventions for Short Term Goal #1 RN CM reviewed  with patient ways of monitoring COPD, signs & symptoms, and when to notify physician.     RN CM will contact in the month of September and wife agreeable.    [name], RN, MSN [place] Management Care Management Coordinator Direct Line [phone] Cell [phone] Toll Free: [phone]  Fax: [phone]

## 2020-01-18 DIAGNOSIS — S51012D Laceration without foreign body of left elbow, subsequent encounter: Secondary | ICD-10-CM | POA: Diagnosis not present

## 2020-01-18 DIAGNOSIS — I480 Paroxysmal atrial fibrillation: Secondary | ICD-10-CM | POA: Diagnosis not present

## 2020-01-18 DIAGNOSIS — J449 Chronic obstructive pulmonary disease, unspecified: Secondary | ICD-10-CM | POA: Diagnosis not present

## 2020-01-18 DIAGNOSIS — F418 Other specified anxiety disorders: Secondary | ICD-10-CM | POA: Diagnosis not present

## 2020-01-18 DIAGNOSIS — E785 Hyperlipidemia, unspecified: Secondary | ICD-10-CM | POA: Diagnosis not present

## 2020-01-18 DIAGNOSIS — I739 Peripheral vascular disease, unspecified: Secondary | ICD-10-CM | POA: Diagnosis not present

## 2020-01-18 DIAGNOSIS — I5023 Acute on chronic systolic (congestive) heart failure: Secondary | ICD-10-CM | POA: Diagnosis not present

## 2020-01-18 DIAGNOSIS — M5000 Cervical disc disorder with myelopathy, unspecified cervical region: Secondary | ICD-10-CM | POA: Diagnosis not present

## 2020-01-18 DIAGNOSIS — I110 Hypertensive heart disease with heart failure: Secondary | ICD-10-CM | POA: Diagnosis not present

## 2020-01-22 DIAGNOSIS — J441 Chronic obstructive pulmonary disease with (acute) exacerbation: Secondary | ICD-10-CM | POA: Diagnosis not present

## 2020-01-22 DIAGNOSIS — J9610 Chronic respiratory failure, unspecified whether with hypoxia or hypercapnia: Secondary | ICD-10-CM | POA: Diagnosis not present

## 2020-01-23 ENCOUNTER — Other Ambulatory Visit: Payer: Self-pay

## 2020-01-23 ENCOUNTER — Encounter (INDEPENDENT_AMBULATORY_CARE_PROVIDER_SITE_OTHER): Payer: Self-pay | Admitting: Vascular Surgery

## 2020-01-23 ENCOUNTER — Ambulatory Visit (INDEPENDENT_AMBULATORY_CARE_PROVIDER_SITE_OTHER): Payer: Medicare HMO | Admitting: Vascular Surgery

## 2020-01-23 VITALS — BP 98/66 | HR 81 | Ht 72.0 in | Wt 146.0 lb

## 2020-01-23 DIAGNOSIS — Z89512 Acquired absence of left leg below knee: Secondary | ICD-10-CM

## 2020-01-23 DIAGNOSIS — I7025 Atherosclerosis of native arteries of other extremities with ulceration: Secondary | ICD-10-CM

## 2020-01-23 DIAGNOSIS — J441 Chronic obstructive pulmonary disease with (acute) exacerbation: Secondary | ICD-10-CM | POA: Diagnosis not present

## 2020-01-23 DIAGNOSIS — I10 Essential (primary) hypertension: Secondary | ICD-10-CM

## 2020-01-23 DIAGNOSIS — Z89611 Acquired absence of right leg above knee: Secondary | ICD-10-CM

## 2020-01-23 DIAGNOSIS — J449 Chronic obstructive pulmonary disease, unspecified: Secondary | ICD-10-CM

## 2020-01-23 NOTE — Progress Notes (Signed)
Patient ID: Tyler [name], male   DOB: [birth year], 64 y.o.   MRN: [id number]  Chief Complaint  Patient presents with  . Follow-up    5 weeks no studies    HPI Tyler [name] is a 64 y.o. male.  Patient returns about 2 months status post left below-knee amputation.  He is doing well.  His wound is basically healed at this point with a little bit of scab medially and laterally as well as in the center portion.  No erythema or drainage.  No tenderness.  His pain is markedly improved from his preamputation state.   Past Medical History:  Diagnosis Date  . AICD (automatic cardioverter/defibrillator) present   . Anxiety   . Arthritis   . Atherosclerosis of artery of extremity with ulceration ([place]) 09/2019   left foot s/p toe amp requiring debridement and futher toe amputations.  . Atrial fibrillation ([place])   . Cervical spinal stenosis    with neuropathy  . CHF (congestive heart failure) ([place])   . Constipation   . COPD (chronic obstructive pulmonary disease) ([place])   . Coronary artery disease   . Depression   . Dyspnea   . Dysrhythmia    atrial fibrillation  . Emphysema of lung ([place])   . Factor 5 Leiden mutation, heterozygous ([place])    on coumadin  . GERD (gastroesophageal reflux disease)   . Hypertension   . Lung nodule seen on imaging study    being followed by dr. [name]. just watching it for last few years, without change  . Myocardial infarction [facility]) 2004   stent placed, pacemaker implanted 2005  . Osteomyelitis ([place]) 10/2019   left foot  . Oxygen dependent    requires 2L nasal prong oxygen 24 hours a day  . Peripheral vascular disease ([place])   . Presence of permanent cardiac pacemaker 2005,2018  . Sleep apnea    waiting to have sleep study. Used bipap while hospitalized and said it was great for him.    Past Surgical History:  Procedure Laterality Date  . ABOVE KNEE LEG AMPUTATION Right    after below the knee amputation   . AMPUTATION Left 11/22/2019    Procedure: AMPUTATION BELOW KNEE;  Surgeon: Algernon Huxley, MD;  Location: [facility] ORS;  Service: Vascular;  Laterality: Left;  . AMPUTATION TOE Left 08/04/2019   Procedure: AMPUTATION TOE MPJ LEFT;  Surgeon: Samara Deist, DPM;  Location: [facility] ORS;  Service: Podiatry;  Laterality: Left;  . AMPUTATION TOE Left 10/06/2019   Procedure: AMPUTATION TOE MPJ T1,T2 LEFT;  Surgeon: Samara Deist, DPM;  Location: [facility] ORS;  Service: Podiatry;  Laterality: Left;  . APPLICATION OF WOUND VAC Left 01/12/2018   Procedure: APPLICATION OF WOUND VAC;  Surgeon: Algernon Huxley, MD;  Location: [facility] ORS;  Service: Vascular;  Laterality: Left;  . BELOW KNEE LEG AMPUTATION Right   . CATARACT EXTRACTION, BILATERAL Bilateral   . CORONARY ANGIOPLASTY  2005   stent x 1 placed  . ENDARTERECTOMY FEMORAL Left 08/11/2017   Procedure: ENDARTERECTOMY FEMORAL;  Surgeon: Algernon Huxley, MD;  Location: [facility] ORS;  Service: Vascular;  Laterality: Left;  . HEMATOMA EVACUATION Left 01/12/2018   Procedure: EVACUATION HEMATOMA ( DRAINING OF SEROMA);  Surgeon: Algernon Huxley, MD;  Location: [facility] ORS;  Service: Vascular;  Laterality: Left;  . IMPLANTABLE CARDIOVERTER DEFIBRILLATOR (ICD) GENERATOR CHANGE Left 02/10/2017   Procedure: ICD GENERATOR CHANGE;  Surgeon: Isaias Cowman, MD;  Location: [facility] ORS;  Service: Cardiovascular;  Laterality: Left;  . INSERT / REPLACE / REMOVE PACEMAKER  5638,7564, 2018  . LOWER EXTREMITY ANGIOGRAPHY Left 06/07/2017   Procedure: LOWER EXTREMITY ANGIOGRAPHY;  Surgeon: Algernon Huxley, MD;  Location: [place] CV LAB;  Service: Cardiovascular;  Laterality: Left;  . LOWER EXTREMITY ANGIOGRAPHY Left 10/05/2019   Procedure: LOWER EXTREMITY ANGIOGRAPHY;  Surgeon: Algernon Huxley, MD;  Location: [place] CV LAB;  Service: Cardiovascular;  Laterality: Left;  . LOWER EXTREMITY INTERVENTION  06/07/2017   Procedure: LOWER EXTREMITY INTERVENTION;  Surgeon: Algernon Huxley, MD;  Location: [place] CV LAB;  Service:  Cardiovascular;;  . PERIPHERAL VASCULAR CATHETERIZATION Left 12/09/2015   Procedure: Lower Extremity Angiography;  Surgeon: Algernon Huxley, MD;  Location: [place] CV LAB;  Service: Cardiovascular;  Laterality: Left;  . PERIPHERAL VASCULAR CATHETERIZATION  12/09/2015   Procedure: Lower Extremity Intervention;  Surgeon: Algernon Huxley, MD;  Location: [place] CV LAB;  Service: Cardiovascular;;  . TONSILLECTOMY    . TRANSMETATARSAL AMPUTATION Left 10/20/2019   Procedure: TRANSMETATARSAL AMPUTATION;  Surgeon: Sharlotte Alamo, DPM;  Location: [facility] ORS;  Service: Podiatry;  Laterality: Left;      Allergies  Allergen Reactions  . Apixaban Rash  . Rivaroxaban Rash    Current Outpatient Medications  Medication Sig Dispense Refill  . acetaminophen (TYLENOL) 500 MG tablet Take 1,000 mg by mouth daily as needed for moderate pain or headache.    . ALPRAZolam (XANAX) 1 MG tablet Take 1 mg by mouth 2 (two) times daily as needed for anxiety or sleep.     Marland Kitchen aspirin EC 81 MG tablet Take 1 tablet (81 mg total) by mouth daily. (Patient taking differently: Take 81 mg by mouth daily after supper. ) 150 tablet 2  . budesonide (PULMICORT) 0.25 MG/2ML nebulizer solution Inhale into the lungs.    . citalopram (CELEXA) 10 MG tablet Take 10 mg by mouth daily.    Marland Kitchen docusate sodium (COLACE) 100 MG capsule Take 100 mg by mouth daily as needed (for constipation.).     Marland Kitchen ENTRESTO 24-26 MG Take 1 tablet by mouth 2 (two) times daily. 60 tablet 0  . fluticasone (FLONASE) 50 MCG/ACT nasal spray Place 2 sprays into both nostrils daily.    . fluticasone-salmeterol (ADVAIR HFA) 115-21 MCG/ACT inhaler USE 2 INHALATIONS ORALLY EVERY 12 HOURS (Patient taking differently: Inhale 2 puffs into the lungs every 12 (twelve) hours. ) 36 Inhaler 5  . furosemide (LASIX) 20 MG tablet Take 2 tablets (40 mg total) by mouth daily. (Patient taking differently: Take 20 mg by mouth daily. ) 60 tablet 0  . gabapentin (NEURONTIN) 100 MG capsule  Take 300 mg by mouth in the morning, at noon, and at bedtime.    Marland Kitchen guaiFENesin (MUCINEX) 600 MG 12 hr tablet Take 600 mg by mouth every evening.     Marland Kitchen ipratropium (ATROVENT) 0.03 % nasal spray Place 2 sprays into both nostrils 2 (two) times daily.     . Ipratropium-Albuterol (COMBIVENT RESPIMAT) 20-100 MCG/ACT AERS respimat Inhale 1 puff into the lungs every 4 (four) hours.     Marland Kitchen levalbuterol (XOPENEX) 1.25 MG/3ML nebulizer solution Take 1.25 mg (3 mLs total) by nebulization every 4 (four) hours. (Patient taking differently: Take 3 mLs by nebulization every 4 (four) hours as needed for wheezing or shortness of breath. ) 72 mL 12  . loratadine (CLARITIN) 10 MG tablet Take 10 mg by mouth at bedtime.    . lovastatin (MEVACOR) 20 MG tablet Take 20 mg  by mouth at bedtime.    . metoprolol succinate (TOPROL-XL) 25 MG 24 hr tablet Take 25 mg by mouth daily.    . mupirocin ointment (BACTROBAN) 2 % Apply 1 application topically daily. 22 g 0  . nitroGLYCERIN (NITROSTAT) 0.4 MG SL tablet Place 0.4 mg under the tongue every 5 (five) minutes as needed for chest pain.    Marland Kitchen oxyCODONE-acetaminophen (PERCOCET) 7.5-325 MG tablet Take 1-2 tablets by mouth every 6 (six) hours as needed for moderate pain. 50 tablet 0  . pantoprazole (PROTONIX) 40 MG tablet Take 40 mg by mouth daily before breakfast.     . Respiratory Therapy Supplies (FLUTTER) DEVI Use as directed. 1 each 0  . roflumilast (DALIRESP) 500 MCG TABS tablet Take 500 mcg by mouth daily.    Marland Kitchen SPIRIVA RESPIMAT 1.25 MCG/ACT AERS INHALE 2 PUFFS BY MOUTH INTO THE LUNGS DAILY 4 g 6  . tamsulosin (FLOMAX) 0.4 MG CAPS capsule Take 0.4 mg by mouth daily after supper.   11  . vitamin B-12 (CYANOCOBALAMIN) 1000 MCG tablet Take 1,000 mcg by mouth daily.    Marland Kitchen warfarin (COUMADIN) 5 MG tablet Take 5 mg by mouth daily.    . Wound Dressings (RESTORE WOUND CARE DRESSING) PADS Apply 1 each topically daily.     . sildenafil (VIAGRA) 25 MG tablet Take by mouth.     No current  facility-administered medications for this visit.        Physical Exam BP 98/66   Pulse 81   Ht 6' (1.829 m)   Wt 146 lb (66.2 kg)   BMI 19.80 kg/m  Gen:  WD/WN, NAD Skin: incision C/D/I.  Few small areas of scabs as described above.  No erythema or drainage.     Assessment/Plan: Hx of AKA (above knee amputation), right (HCC) Well healed  Essential hypertension, benign blood pressure control important in reducing the progression of atherosclerotic disease. On appropriate oral medications.  Chronic systolic CHF (congestive heart failure) (HCC) His most recent echocardiogram showed a reduction in his ejection fraction down to about 30%. Hehe went for what was supposed to be a dual chamber pacemaker a few weeks ago but was found to already have that. They did do some "tweaking" of the pacer and this may improve his function somewhat.  COPD (chronic obstructive pulmonary disease) (HCC) Severe, requiring oxygen  Atherosclerosis of native arteries of the extremities with ulceration ([place]) Now status post left BKA.  It is healing well.  Recheck in 3 months.  Hx of BKA, left ([place]) 2 months postop.  Healing well.  Referral to [employer] for prosthetic evaluation.  Return to clinic in 3 months.      Leotis Pain 01/23/2020, 3:57 PM   This note was created with Dragon medical transcription system.  Any errors from dictation are unintentional.

## 2020-01-23 NOTE — Assessment & Plan Note (Signed)
2 months postop.  Healing well.  Referral to [employer] for prosthetic evaluation.  Return to clinic in 3 months.

## 2020-01-23 NOTE — Assessment & Plan Note (Signed)
Now status post left BKA.  It is healing well.  Recheck in 3 months.

## 2020-01-25 DIAGNOSIS — M5000 Cervical disc disorder with myelopathy, unspecified cervical region: Secondary | ICD-10-CM | POA: Diagnosis not present

## 2020-01-25 DIAGNOSIS — J441 Chronic obstructive pulmonary disease with (acute) exacerbation: Secondary | ICD-10-CM | POA: Diagnosis not present

## 2020-01-26 DIAGNOSIS — I110 Hypertensive heart disease with heart failure: Secondary | ICD-10-CM | POA: Diagnosis not present

## 2020-01-26 DIAGNOSIS — S51012D Laceration without foreign body of left elbow, subsequent encounter: Secondary | ICD-10-CM | POA: Diagnosis not present

## 2020-01-26 DIAGNOSIS — I5023 Acute on chronic systolic (congestive) heart failure: Secondary | ICD-10-CM | POA: Diagnosis not present

## 2020-01-26 DIAGNOSIS — F418 Other specified anxiety disorders: Secondary | ICD-10-CM | POA: Diagnosis not present

## 2020-01-26 DIAGNOSIS — J449 Chronic obstructive pulmonary disease, unspecified: Secondary | ICD-10-CM | POA: Diagnosis not present

## 2020-01-26 DIAGNOSIS — I739 Peripheral vascular disease, unspecified: Secondary | ICD-10-CM | POA: Diagnosis not present

## 2020-01-26 DIAGNOSIS — M5000 Cervical disc disorder with myelopathy, unspecified cervical region: Secondary | ICD-10-CM | POA: Diagnosis not present

## 2020-01-26 DIAGNOSIS — I480 Paroxysmal atrial fibrillation: Secondary | ICD-10-CM | POA: Diagnosis not present

## 2020-01-26 DIAGNOSIS — E785 Hyperlipidemia, unspecified: Secondary | ICD-10-CM | POA: Diagnosis not present

## 2020-01-30 DIAGNOSIS — I110 Hypertensive heart disease with heart failure: Secondary | ICD-10-CM | POA: Diagnosis not present

## 2020-01-30 DIAGNOSIS — I739 Peripheral vascular disease, unspecified: Secondary | ICD-10-CM | POA: Diagnosis not present

## 2020-01-30 DIAGNOSIS — D6851 Activated protein C resistance: Secondary | ICD-10-CM | POA: Diagnosis not present

## 2020-01-30 DIAGNOSIS — S51012D Laceration without foreign body of left elbow, subsequent encounter: Secondary | ICD-10-CM | POA: Diagnosis not present

## 2020-01-30 DIAGNOSIS — J449 Chronic obstructive pulmonary disease, unspecified: Secondary | ICD-10-CM | POA: Diagnosis not present

## 2020-01-30 DIAGNOSIS — Z23 Encounter for immunization: Secondary | ICD-10-CM | POA: Diagnosis not present

## 2020-01-30 DIAGNOSIS — I480 Paroxysmal atrial fibrillation: Secondary | ICD-10-CM | POA: Diagnosis not present

## 2020-01-30 DIAGNOSIS — I2510 Atherosclerotic heart disease of native coronary artery without angina pectoris: Secondary | ICD-10-CM | POA: Diagnosis not present

## 2020-01-30 DIAGNOSIS — Z01818 Encounter for other preprocedural examination: Secondary | ICD-10-CM | POA: Diagnosis not present

## 2020-01-30 DIAGNOSIS — J431 Panlobular emphysema: Secondary | ICD-10-CM | POA: Diagnosis not present

## 2020-01-30 DIAGNOSIS — E785 Hyperlipidemia, unspecified: Secondary | ICD-10-CM | POA: Diagnosis not present

## 2020-01-30 DIAGNOSIS — J9611 Chronic respiratory failure with hypoxia: Secondary | ICD-10-CM | POA: Diagnosis not present

## 2020-01-30 DIAGNOSIS — Z89512 Acquired absence of left leg below knee: Secondary | ICD-10-CM | POA: Diagnosis not present

## 2020-01-30 DIAGNOSIS — I5023 Acute on chronic systolic (congestive) heart failure: Secondary | ICD-10-CM | POA: Diagnosis not present

## 2020-01-30 DIAGNOSIS — I482 Chronic atrial fibrillation, unspecified: Secondary | ICD-10-CM | POA: Diagnosis not present

## 2020-01-31 ENCOUNTER — Other Ambulatory Visit: Payer: Self-pay

## 2020-01-31 DIAGNOSIS — I110 Hypertensive heart disease with heart failure: Secondary | ICD-10-CM | POA: Diagnosis not present

## 2020-01-31 DIAGNOSIS — I5023 Acute on chronic systolic (congestive) heart failure: Secondary | ICD-10-CM | POA: Diagnosis not present

## 2020-01-31 DIAGNOSIS — E785 Hyperlipidemia, unspecified: Secondary | ICD-10-CM | POA: Diagnosis not present

## 2020-01-31 DIAGNOSIS — S51012D Laceration without foreign body of left elbow, subsequent encounter: Secondary | ICD-10-CM | POA: Diagnosis not present

## 2020-01-31 DIAGNOSIS — F418 Other specified anxiety disorders: Secondary | ICD-10-CM | POA: Diagnosis not present

## 2020-01-31 DIAGNOSIS — J449 Chronic obstructive pulmonary disease, unspecified: Secondary | ICD-10-CM | POA: Diagnosis not present

## 2020-01-31 DIAGNOSIS — I480 Paroxysmal atrial fibrillation: Secondary | ICD-10-CM | POA: Diagnosis not present

## 2020-01-31 DIAGNOSIS — M5000 Cervical disc disorder with myelopathy, unspecified cervical region: Secondary | ICD-10-CM | POA: Diagnosis not present

## 2020-01-31 DIAGNOSIS — I739 Peripheral vascular disease, unspecified: Secondary | ICD-10-CM | POA: Diagnosis not present

## 2020-01-31 NOTE — Patient Outreach (Signed)
[place] [facility]) Care Management  01/31/2020  Tyler [name] 06-May-1956 [id number]   Telephone call to patient for follow up.  No answer. HIPAA compliant voice message left.   Plan: RN CM will attempt patient again within 4 business days.  [name], RN, MSN [place] Management Care Management Coordinator Direct Line [phone] Cell [phone] Toll Free: [phone]  Fax: [phone]

## 2020-02-01 ENCOUNTER — Other Ambulatory Visit: Payer: Self-pay

## 2020-02-01 NOTE — Patient Outreach (Signed)
[place] [facility]) Care Management  [place]  02/01/2020   Tyler [name] [birth year] [id number]  Subjective: Incoming call from patient.  He reports that he is doing good.  He reports that he has been released from Psychologist, sport and exercise for 3 months.  He was fitted for stump shrinker for left stump site.  He reports that area on left stump that would not heal is healing well and that the doctor was pleased.  He reports COPD about the same. Discussed COPD and care.  Patient reports also that he will be scheduled for a colonoscopy in the next couple of weeks.  He is doing his clearances now.  Discussed reason for clearances with patient. He verbalized understanding and voice no concerns.    Objective:   Encounter Medications:  Outpatient Encounter Medications as of 02/01/2020  Medication Sig Note  . acetaminophen (TYLENOL) 500 MG tablet Take 1,000 mg by mouth daily as needed for moderate pain or headache.   . ALPRAZolam (XANAX) 1 MG tablet Take 1 mg by mouth 2 (two) times daily as needed for anxiety or sleep.    Marland Kitchen aspirin EC 81 MG tablet Take 1 tablet (81 mg total) by mouth daily. (Patient taking differently: Take 81 mg by mouth daily after supper. )   . budesonide (PULMICORT) 0.25 MG/2ML nebulizer solution Inhale into the lungs.   . citalopram (CELEXA) 10 MG tablet Take 10 mg by mouth daily.   Marland Kitchen docusate sodium (COLACE) 100 MG capsule Take 100 mg by mouth daily as needed (for constipation.).    Marland Kitchen ENTRESTO 24-26 MG Take 1 tablet by mouth 2 (two) times daily.   . fluticasone (FLONASE) 50 MCG/ACT nasal spray Place 2 sprays into both nostrils daily.   . fluticasone-salmeterol (ADVAIR HFA) 115-21 MCG/ACT inhaler USE 2 INHALATIONS ORALLY EVERY 12 HOURS (Patient taking differently: Inhale 2 puffs into the lungs every 12 (twelve) hours. )   . furosemide (LASIX) 20 MG tablet Take 2 tablets (40 mg total) by mouth daily. (Patient taking differently: Take 20 mg by mouth daily. )   . gabapentin  (NEURONTIN) 100 MG capsule Take 300 mg by mouth in the morning, at noon, and at bedtime.   Marland Kitchen guaiFENesin (MUCINEX) 600 MG 12 hr tablet Take 600 mg by mouth every evening.    Marland Kitchen ipratropium (ATROVENT) 0.03 % nasal spray Place 2 sprays into both nostrils 2 (two) times daily.    . Ipratropium-Albuterol (COMBIVENT RESPIMAT) 20-100 MCG/ACT AERS respimat Inhale 1 puff into the lungs every 4 (four) hours.  07/27/2019: Pt confirmed he takes every 4 hours  . levalbuterol (XOPENEX) 1.25 MG/3ML nebulizer solution Take 1.25 mg (3 mLs total) by nebulization every 4 (four) hours. (Patient taking differently: Take 3 mLs by nebulization every 4 (four) hours as needed for wheezing or shortness of breath. )   . loratadine (CLARITIN) 10 MG tablet Take 10 mg by mouth at bedtime.   . lovastatin (MEVACOR) 20 MG tablet Take 20 mg by mouth at bedtime.   . metoprolol succinate (TOPROL-XL) 25 MG 24 hr tablet Take 25 mg by mouth daily.   . mupirocin ointment (BACTROBAN) 2 % Apply 1 application topically daily.   . nitroGLYCERIN (NITROSTAT) 0.4 MG SL tablet Place 0.4 mg under the tongue every 5 (five) minutes as needed for chest pain.   Marland Kitchen oxyCODONE-acetaminophen (PERCOCET) 7.5-325 MG tablet Take 1-2 tablets by mouth every 6 (six) hours as needed for moderate pain.   . pantoprazole (PROTONIX) 40 MG tablet Take  40 mg by mouth daily before breakfast.    . Respiratory Therapy Supplies (FLUTTER) DEVI Use as directed.   . roflumilast (DALIRESP) 500 MCG TABS tablet Take 500 mcg by mouth daily.   Marland Kitchen SPIRIVA RESPIMAT 1.25 MCG/ACT AERS INHALE 2 PUFFS BY MOUTH INTO THE LUNGS DAILY   . tamsulosin (FLOMAX) 0.4 MG CAPS capsule Take 0.4 mg by mouth daily after supper.    . vitamin B-12 (CYANOCOBALAMIN) 1000 MCG tablet Take 1,000 mcg by mouth daily.   Marland Kitchen warfarin (COUMADIN) 5 MG tablet Take 5 mg by mouth daily.   . Wound Dressings (RESTORE WOUND CARE DRESSING) PADS Apply 1 each topically daily.    . sildenafil (VIAGRA) 25 MG tablet Take by  mouth.    No facility-administered encounter medications on file as of 02/01/2020.    Functional Status:  In your present state of health, do you have any difficulty performing the following activities: 02/01/2020 12/20/2019  Hearing? N N  Vision? N N  Difficulty concentrating or making decisions? N N  Walking or climbing stairs? [name]  Comment Patient double amputee -  Dressing or bathing? N Y  Doing errands, shopping? Y N  Comment Wife assists -  Conservation officer, nature and eating ? N -  Using the Toilet? N -  In the past six months, have you accidently leaked urine? N -  Do you have problems with loss of bowel control? N -  Managing your Medications? N -  Managing your Finances? Y -  Comment wife assists -  Housekeeping or managing your Housekeeping? Y -  Comment wife assists -  Some recent data might be hidden    Fall/Depression Screening: Fall Risk  02/01/2020 12/20/2019 12/01/2019  Falls in the past year? 1 1 1   Number falls in past yr: 0 0 0  Injury with Fall? 0 0 0  Risk for fall due to : - History of fall(s) -  Follow up - Falls evaluation completed -   PHQ 2/9 Scores 02/01/2020 12/01/2019 10/30/2019  PHQ - 2 Score 0 0 0   SDOH Screenings   Alcohol Screen:   . Last Alcohol Screening Score (AUDIT): Not on file  Depression (PHQ2-9): Low Risk   . PHQ-2 Score: 0  Financial Resource Strain:   . Difficulty of Paying Living Expenses: Not on file  Food Insecurity:   . Worried About Charity fundraiser in the Last Year: Not on file  . Ran Out of Food in the Last Year: Not on file  Housing: Low Risk   . Last Housing Risk Score: 0  Physical Activity:   . Days of Exercise per Week: Not on file  . Minutes of Exercise per Session: Not on file  Social Connections:   . Frequency of Communication with Friends and Family: Not on file  . Frequency of Social Gatherings with Friends and Family: Not on file  . Attends Religious Services: Not on file  . Active Member of Clubs or Organizations: Not  on file  . Attends Archivist Meetings: Not on file  . Marital Status: Not on file  Stress: No Stress Concern Present  . Feeling of Stress : Not at all  Tobacco Use: Medium Risk  . Smoking Tobacco Use: Former Smoker  . Smokeless Tobacco Use: Never Used  Transportation Needs: No Transportation Needs  . Lack of Transportation (Medical): No  . Lack of Transportation (Non-Medical): No    Assessment: Patient continues to recover from recent health challenges.  Patient doing well in managing chronic conditions and benefits from telephonic follow up  Plan:  [facility] CM Care Plan Problem One     Most Recent Value  Care Plan Problem One COPD Management  Role Documenting the Problem One Care Management Telephonic Coordinator  Care Plan for Problem One Active  THN Long Term Goal  Over the next 90 days, patient will demonstrate and/or verbalize understanding of self-health management for long term care of COPD.  THN Long Term Goal Start Date 02/01/20  Interventions for Problem One Long Term Goal Discussed COPD status and importance of maintenance inhalers and pacing self with activity.      RN CM will place patient in disease management program. RN CM will send update letters to patient and physician. RN CM will contact patient next month and patient agreeable.  [name], RN, MSN [place] Management Care Management Coordinator Direct Line [phone] Cell [phone] Toll Free: [phone]  Fax: [phone]

## 2020-02-06 ENCOUNTER — Ambulatory Visit: Payer: Self-pay

## 2020-02-08 DIAGNOSIS — J439 Emphysema, unspecified: Secondary | ICD-10-CM | POA: Diagnosis not present

## 2020-02-08 DIAGNOSIS — Z01818 Encounter for other preprocedural examination: Secondary | ICD-10-CM | POA: Diagnosis not present

## 2020-02-08 DIAGNOSIS — I482 Chronic atrial fibrillation, unspecified: Secondary | ICD-10-CM | POA: Diagnosis not present

## 2020-02-08 DIAGNOSIS — I517 Cardiomegaly: Secondary | ICD-10-CM | POA: Diagnosis not present

## 2020-02-08 DIAGNOSIS — J479 Bronchiectasis, uncomplicated: Secondary | ICD-10-CM | POA: Diagnosis not present

## 2020-02-13 DIAGNOSIS — I495 Sick sinus syndrome: Secondary | ICD-10-CM | POA: Diagnosis not present

## 2020-02-14 ENCOUNTER — Encounter (INDEPENDENT_AMBULATORY_CARE_PROVIDER_SITE_OTHER): Payer: Self-pay

## 2020-02-15 ENCOUNTER — Other Ambulatory Visit: Payer: Self-pay

## 2020-02-15 ENCOUNTER — Other Ambulatory Visit
Admission: RE | Admit: 2020-02-15 | Discharge: 2020-02-15 | Disposition: A | Discharge status: Home / Self Care | Payer: Medicare HMO | Source: Ambulatory Visit | Attending: Gastroenterology | Admitting: Gastroenterology

## 2020-02-15 DIAGNOSIS — Z01812 Encounter for preprocedural laboratory examination: Secondary | ICD-10-CM | POA: Insufficient documentation

## 2020-02-15 DIAGNOSIS — Z20822 Contact with and (suspected) exposure to covid-19: Secondary | ICD-10-CM | POA: Diagnosis not present

## 2020-02-15 LAB — SARS CORONAVIRUS 2 (TAT 6-24 HRS): SARS Coronavirus 2: NEGATIVE

## 2020-02-19 ENCOUNTER — Ambulatory Visit: Payer: Medicare HMO | Admitting: Anesthesiology

## 2020-02-19 ENCOUNTER — Encounter
Admission: RE | Disposition: A | Discharge status: Home / Self Care | Payer: Self-pay | Source: Home / Self Care | Attending: Gastroenterology

## 2020-02-19 ENCOUNTER — Encounter: Payer: Self-pay | Admitting: Anesthesiology

## 2020-02-19 ENCOUNTER — Ambulatory Visit
Admission: RE | Admit: 2020-02-19 | Discharge: 2020-02-19 | Disposition: A | Discharge status: Home / Self Care | Payer: Medicare HMO | Attending: Gastroenterology | Admitting: Gastroenterology

## 2020-02-19 DIAGNOSIS — R1310 Dysphagia, unspecified: Secondary | ICD-10-CM | POA: Insufficient documentation

## 2020-02-19 DIAGNOSIS — K219 Gastro-esophageal reflux disease without esophagitis: Secondary | ICD-10-CM | POA: Diagnosis not present

## 2020-02-19 DIAGNOSIS — Z1211 Encounter for screening for malignant neoplasm of colon: Secondary | ICD-10-CM | POA: Diagnosis not present

## 2020-02-19 DIAGNOSIS — K635 Polyp of colon: Secondary | ICD-10-CM | POA: Diagnosis not present

## 2020-02-19 DIAGNOSIS — K449 Diaphragmatic hernia without obstruction or gangrene: Secondary | ICD-10-CM | POA: Insufficient documentation

## 2020-02-19 DIAGNOSIS — Z86010 Personal history of colonic polyps: Secondary | ICD-10-CM | POA: Diagnosis not present

## 2020-02-19 DIAGNOSIS — B3781 Candidal esophagitis: Secondary | ICD-10-CM | POA: Diagnosis not present

## 2020-02-19 DIAGNOSIS — K5790 Diverticulosis of intestine, part unspecified, without perforation or abscess without bleeding: Secondary | ICD-10-CM | POA: Diagnosis not present

## 2020-02-19 DIAGNOSIS — K2100 Gastro-esophageal reflux disease with esophagitis, without bleeding: Secondary | ICD-10-CM | POA: Diagnosis not present

## 2020-02-19 DIAGNOSIS — D124 Benign neoplasm of descending colon: Secondary | ICD-10-CM | POA: Diagnosis not present

## 2020-02-19 DIAGNOSIS — K228 Other specified diseases of esophagus: Secondary | ICD-10-CM | POA: Diagnosis not present

## 2020-02-19 DIAGNOSIS — I110 Hypertensive heart disease with heart failure: Secondary | ICD-10-CM | POA: Diagnosis not present

## 2020-02-19 DIAGNOSIS — I5023 Acute on chronic systolic (congestive) heart failure: Secondary | ICD-10-CM | POA: Diagnosis not present

## 2020-02-19 DIAGNOSIS — K5730 Diverticulosis of large intestine without perforation or abscess without bleeding: Secondary | ICD-10-CM | POA: Insufficient documentation

## 2020-02-19 DIAGNOSIS — J439 Emphysema, unspecified: Secondary | ICD-10-CM | POA: Diagnosis not present

## 2020-02-19 HISTORY — DX: Rheumatic tricuspid insufficiency: I07.1

## 2020-02-19 HISTORY — PX: COLONOSCOPY: SHX5424

## 2020-02-19 HISTORY — DX: Activated protein C resistance: D68.51

## 2020-02-19 HISTORY — PX: ESOPHAGOGASTRODUODENOSCOPY: SHX5428

## 2020-02-19 HISTORY — DX: Nonrheumatic mitral (valve) insufficiency: I34.0

## 2020-02-19 HISTORY — DX: Peripheral vascular disease, unspecified: I73.9

## 2020-02-19 LAB — KOH PREP

## 2020-02-19 SURGERY — EGD (ESOPHAGOGASTRODUODENOSCOPY)
Anesthesia: General

## 2020-02-19 MED ORDER — PROPOFOL 500 MG/50ML IV EMUL
INTRAVENOUS | Status: AC
  Filled 2020-02-19: qty 50

## 2020-02-19 MED ORDER — PROPOFOL 500 MG/50ML IV EMUL
INTRAVENOUS | Status: DC | PRN
  Administered 2020-02-19: 100 ug/kg/min via INTRAVENOUS

## 2020-02-19 MED ORDER — PROPOFOL 10 MG/ML IV BOLUS
INTRAVENOUS | Status: AC
  Filled 2020-02-19: qty 40

## 2020-02-19 MED ORDER — BUTAMBEN-TETRACAINE-BENZOCAINE 2-2-14 % EX AERO
INHALATION_SPRAY | CUTANEOUS | Status: AC
  Filled 2020-02-19: qty 5

## 2020-02-19 MED ORDER — EPHEDRINE SULFATE 50 MG/ML IJ SOLN
INTRAMUSCULAR | Status: DC | PRN
  Administered 2020-02-19: 5 mg via INTRAVENOUS
  Administered 2020-02-19 (×3): 10 mg via INTRAVENOUS

## 2020-02-19 MED ORDER — SODIUM CHLORIDE 0.9 % IV SOLN
INTRAVENOUS | Status: DC
  Administered 2020-02-19 (×2): 20 mL/h via INTRAVENOUS

## 2020-02-19 MED ORDER — EPHEDRINE 5 MG/ML INJ
INTRAVENOUS | Status: AC
  Filled 2020-02-19: qty 10

## 2020-02-19 NOTE — Op Note (Signed)
[facility] Gastroenterology Patient Name: Tyler [name] Procedure Date: 02/19/2020 10:36 AM MRN: [id number] Account #: [id number] Date of Birth: 12-19-1955 Admit Type: Outpatient Age: 64 Room: [facility] ENDO ROOM 2 Gender: Male Note Status: Finalized Procedure:             Colonoscopy Indications:           High risk colon cancer surveillance: Personal history                         of colonic polyps Providers:             Andrey Farmer MD, MD Medicines:             Monitored Anesthesia Care Complications:         No immediate complications. Estimated blood loss:                         Minimal. Procedure:             Pre-Anesthesia Assessment:                        - Prior to the procedure, a History and Physical was                         performed, and patient medications and allergies were                         reviewed. The patient is competent. The risks and                         benefits of the procedure and the sedation options and                         risks were discussed with the patient. All questions                         were answered and informed consent was obtained.                         Patient identification and proposed procedure were                         verified by the physician, the nurse, the anesthetist                         and the technician in the endoscopy suite. Mental                         Status Examination: alert and oriented. Airway                         Examination: normal oropharyngeal airway and neck                         mobility. Respiratory Examination: clear to                         auscultation. CV Examination: normal. Prophylactic  Antibiotics: The patient does not require prophylactic                         antibiotics. Prior Anticoagulants: The patient has                         taken no previous anticoagulant or antiplatelet                         agents. ASA Grade  Assessment: III - A patient with                         severe systemic disease. After reviewing the risks and                         benefits, the patient was deemed in satisfactory                         condition to undergo the procedure. The anesthesia                         plan was to use monitored anesthesia care (MAC).                         Immediately prior to administration of medications,                         the patient was re-assessed for adequacy to receive                         sedatives. The heart rate, respiratory rate, oxygen                         saturations, blood pressure, adequacy of pulmonary                         ventilation, and response to care were monitored                         throughout the procedure. The physical status of the                         patient was re-assessed after the procedure.                        After obtaining informed consent, the colonoscope was                         passed under direct vision. Throughout the procedure,                         the patient's blood pressure, pulse, and oxygen                         saturations were monitored continuously. The                         Colonoscope was introduced through the anus and  advanced to the the cecum, identified by appendiceal                         orifice and ileocecal valve. The colonoscopy was                         performed without difficulty. The patient tolerated                         the procedure well. The quality of the bowel                         preparation was fair. Findings:      The perianal and digital rectal examinations were normal.      Multiple small and large-mouthed diverticula were found in the sigmoid       colon.      A 15 mm polyp was found in the descending colon. The polyp was sessile.       The polyp was removed with a lift and cut technique using a hot snare.       The polyp was removed with a  piecemeal technique using a hot snare.       Resection and retrieval were complete. To prevent bleeding       post-intervention, two hemostatic clips were successfully placed. There       was no bleeding during, or at the end, of the procedure. Area was       tattooed with an injection of [place] ink.      A moderate amount of liquid stool was found in the entire colon, making       visualization difficult. Lavage of the area was performed using a       moderate amount, resulting in clearance with fair visualization.      The exam was otherwise without abnormality on direct and retroflexion       views. Impression:            - Preparation of the colon was fair. Small polyps                         could be missed.                        - Diverticulosis in the sigmoid colon.                        - One 15 mm polyp in the descending colon, removed                         using lift and cut and a hot snare and removed                         piecemeal using a hot snare. Resected and retrieved.                         Clips were placed. Tattooed directly across the lumen                         for re-identification of site in the future.                        -  Stool in the entire examined colon.                        - The examination was otherwise normal on direct and                         retroflexion views. Recommendation:        - Discharge patient to home.                        - Resume previous diet.                        - Continue present medications.                        - Resume Coumadin (warfarin) at prior dose tomorrow.                        - Await pathology results.                        - Repeat colonoscopy in 6 months for surveillance                         after piecemeal polypectomy. If risks outweigh                         benefits.                        - Return to referring physician as previously                         scheduled. Procedure Code(s):      --- Professional ---                        [id number], Colonoscopy, flexible; with removal of                         tumor(s), polyp(s), or other lesion(s) by snare                         technique                        [id number], Colonoscopy, flexible; with directed submucosal                         injection(s), any substance Diagnosis Code(s):     --- Professional ---                        Z86.010, Personal history of colonic polyps                        K63.5, Polyp of colon                        K57.30, Diverticulosis of large intestine without                         perforation  or abscess without bleeding CPT copyright 2019 American Medical Association. All rights reserved. The codes documented in this report are preliminary and upon coder review may  be revised to meet current compliance requirements. Andrey Farmer, MD Andrey Farmer MD, MD 02/19/2020 12:07:14 PM Number of Addenda: 0 Note Initiated On: 02/19/2020 10:36 AM Scope Withdrawal Time: 0 hours 41 minutes 51 seconds  Total Procedure Duration: 0 hours 51 minutes 56 seconds  Estimated Blood Loss:  Estimated blood loss was minimal.      [facility]

## 2020-02-19 NOTE — Anesthesia Procedure Notes (Signed)
Performed by: Cook-Martin, Tian Mcmurtrey Pre-anesthesia Checklist: Patient identified, Emergency Drugs available, Suction available, Patient being monitored and Timeout performed Patient Re-evaluated:Patient Re-evaluated prior to induction Oxygen Delivery Method: Supernova nasal CPAP Preoxygenation: Pre-oxygenation with 100% oxygen Induction Type: IV induction Airway Equipment and Method: Bite block Placement Confirmation: positive ETCO2 and CO2 detector       

## 2020-02-19 NOTE — Anesthesia Postprocedure Evaluation (Signed)
Anesthesia Post Note  Patient: Tyler [name]  Procedure(s) Performed: ESOPHAGOGASTRODUODENOSCOPY (EGD) (N/A ) COLONOSCOPY (N/A )  Patient location during evaluation: Endoscopy Anesthesia Type: General Level of consciousness: awake and alert Pain management: pain level controlled Vital Signs Assessment: post-procedure vital signs reviewed and stable Respiratory status: spontaneous breathing and respiratory function stable Cardiovascular status: stable Anesthetic complications: no   No complications documented.   Last Vitals:  Vitals:   02/19/20 1203 02/19/20 1213  BP: (!) 104/42 117/60  Pulse: 74 76  Resp: 16 (!) 26  Temp:    SpO2: 100% 100%    Last Pain:  Vitals:   02/19/20 1213  TempSrc:   PainSc: 0-No pain                 [name] K

## 2020-02-19 NOTE — Interval H&P Note (Signed)
History and Physical Interval Note:  02/19/2020 10:42 AM  Tyler [name]  has presented today for surgery, with the diagnosis of GERD,ESOPHAGITIS,DYSPHAGIA,HX.OF COLON POLYPS.  The various methods of treatment have been discussed with the patient and family. After consideration of risks, benefits and other options for treatment, the patient has consented to  Procedure(s): ESOPHAGOGASTRODUODENOSCOPY (EGD) (N/A) COLONOSCOPY (N/A) as a surgical intervention.  The patient's history has been reviewed, patient examined, no change in status, stable for surgery.  I have reviewed the patient's chart and labs.  Questions were answered to the patient's satisfaction.     Lesly Rubenstein  Ok to proceed with EGD/Colonoscopy

## 2020-02-19 NOTE — H&P (Signed)
Outpatient short stay form Pre-procedure 02/19/2020 10:32 AM Raylene Miyamoto MD, MPH  Primary Physician: Dr. Edwina Barth  Reason for visit:  Surveillance/Dysphagia  History of present illness:   64 y/o gentleman with multiple comorbidities including COPD on home 02, peripheral vascular disease on coumading, and CAD. Having some dysphagia with pills and occasionally with solids. No abdominal surgeries but history of lower extremity amputation.    Current Facility-Administered Medications:  .  0.9 %  sodium chloride infusion, , Intravenous, Continuous, Asiyah Pineau, Hilton Cork, MD, Last Rate: 20 mL/hr at 02/19/20 1020, 20 mL/hr at 02/19/20 1020 .  butamben-tetracaine-benzocaine (CETACAINE) 06-26-12 % spray, , , ,   Medications Prior to Admission  Medication Sig Dispense Refill Last Dose  . acetaminophen (TYLENOL) 500 MG tablet Take 1,000 mg by mouth daily as needed for moderate pain or headache.   Past Week at Unknown time  . ALPRAZolam (XANAX) 1 MG tablet Take 1 mg by mouth 2 (two) times daily as needed for anxiety or sleep.    02/18/2020 at Unknown time  . aspirin EC 81 MG tablet Take 1 tablet (81 mg total) by mouth daily. (Patient taking differently: Take 81 mg by mouth daily after supper. ) 150 tablet 2 Past Week at Unknown time  . budesonide (PULMICORT) 0.25 MG/2ML nebulizer solution Inhale into the lungs.   02/18/2020 at Unknown time  . citalopram (CELEXA) 10 MG tablet Take 10 mg by mouth daily.   02/18/2020 at Unknown time  . docusate sodium (COLACE) 100 MG capsule Take 100 mg by mouth daily as needed (for constipation.).    Past Week at Unknown time  . ENTRESTO 24-26 MG Take 1 tablet by mouth 2 (two) times daily. 60 tablet 0 Past Week at Unknown time  . fluticasone (FLONASE) 50 MCG/ACT nasal spray Place 2 sprays into both nostrils daily.   02/18/2020 at Unknown time  . fluticasone-salmeterol (ADVAIR HFA) 115-21 MCG/ACT inhaler USE 2 INHALATIONS ORALLY EVERY 12 HOURS (Patient taking differently:  Inhale 2 puffs into the lungs every 12 (twelve) hours. ) 36 Inhaler 5 02/18/2020 at Unknown time  . furosemide (LASIX) 20 MG tablet Take 2 tablets (40 mg total) by mouth daily. (Patient taking differently: Take 20 mg by mouth daily. ) 60 tablet 0 02/18/2020 at Unknown time  . gabapentin (NEURONTIN) 100 MG capsule Take 300 mg by mouth in the morning, at noon, and at bedtime.   02/18/2020 at Unknown time  . guaiFENesin (MUCINEX) 600 MG 12 hr tablet Take 600 mg by mouth every evening.    Past Week at Unknown time  . Ipratropium-Albuterol (COMBIVENT RESPIMAT) 20-100 MCG/ACT AERS respimat Inhale 1 puff into the lungs every 4 (four) hours.    02/19/2020 at 1000  . levalbuterol (XOPENEX) 1.25 MG/3ML nebulizer solution Take 1.25 mg (3 mLs total) by nebulization every 4 (four) hours. (Patient taking differently: Take 3 mLs by nebulization every 4 (four) hours as needed for wheezing or shortness of breath. ) 72 mL 12 Past Week at Unknown time  . loratadine (CLARITIN) 10 MG tablet Take 10 mg by mouth at bedtime.   02/18/2020 at Unknown time  . lovastatin (MEVACOR) 20 MG tablet Take 20 mg by mouth at bedtime.   02/18/2020 at Unknown time  . metoprolol succinate (TOPROL-XL) 25 MG 24 hr tablet Take 25 mg by mouth daily.   02/18/2020 at Unknown time  . mupirocin ointment (BACTROBAN) 2 % Apply 1 application topically daily. 22 g 0 02/18/2020 at Unknown time  . nitroGLYCERIN (NITROSTAT)  0.4 MG SL tablet Place 0.4 mg under the tongue every 5 (five) minutes as needed for chest pain.   Past Month at Unknown time  . oxyCODONE-acetaminophen (PERCOCET) 7.5-325 MG tablet Take 1-2 tablets by mouth every 6 (six) hours as needed for moderate pain. 50 tablet 0 02/18/2020 at 0700  . pantoprazole (PROTONIX) 40 MG tablet Take 40 mg by mouth daily before breakfast.    02/18/2020 at 0700  . Respiratory Therapy Supplies (FLUTTER) DEVI Use as directed. 1 each 0 Past Week at Unknown time  . roflumilast (DALIRESP) 500 MCG TABS tablet Take 500 mcg  by mouth daily.   Past Week at Unknown time  . SPIRIVA RESPIMAT 1.25 MCG/ACT AERS INHALE 2 PUFFS BY MOUTH INTO THE LUNGS DAILY 4 g 6 02/18/2020 at Unknown time  . tamsulosin (FLOMAX) 0.4 MG CAPS capsule Take 0.4 mg by mouth daily after supper.   11 02/18/2020 at Unknown time  . vitamin B-12 (CYANOCOBALAMIN) 1000 MCG tablet Take 1,000 mcg by mouth daily.   Past Week at Unknown time  . warfarin (COUMADIN) 5 MG tablet Take 5 mg by mouth daily.   Past Week at Unknown time  . Wound Dressings (RESTORE WOUND CARE DRESSING) PADS Apply 1 each topically daily.    Past Week at Unknown time  . ipratropium (ATROVENT) 0.03 % nasal spray Place 2 sprays into both nostrils 2 (two) times daily.      . sildenafil (VIAGRA) 25 MG tablet Take by mouth.        Allergies  Allergen Reactions  . Apixaban Rash  . Rivaroxaban Rash     Past Medical History:  Diagnosis Date  . AICD (automatic cardioverter/defibrillator) present   . Anxiety   . Arthritis   . 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  . Atrial fibrillation ([place])   . Cervical spinal stenosis    with neuropathy  . CHF (congestive heart failure) ([place])   . Constipation   . COPD (chronic obstructive pulmonary disease) ([place])   . Coronary artery disease   . Depression   . Dyspnea   . Dysrhythmia    atrial fibrillation  . Emphysema of lung ([place])   . Factor 5 Leiden mutation, heterozygous ([place])    on coumadin  . Factor V Leiden mutation ([place])   . GERD (gastroesophageal reflux disease)   . Hypertension   . Lung nodule seen on imaging study    being followed by dr. [name]. just watching it for last few years, without change  . Mitral valve insufficiency   . Moderate tricuspid insufficiency   . Myocardial infarction [facility]) 2004   stent placed, pacemaker implanted 2005  . Osteomyelitis ([place]) 10/2019   left foot  . Oxygen dependent    requires 2L  nasal prong oxygen 24 hours a day  . Peripheral vascular disease ([place])   . Presence of permanent cardiac pacemaker 2005,2018  . PVD (peripheral vascular disease) ([place])   . Sleep apnea    waiting to have sleep study. Used bipap while hospitalized and said it was great for him.    Review of systems:  Otherwise negative.    Physical Exam  Gen: Alert, oriented. Appears stated age.  HEENT: [place]/AT. PERRLA. Lungs: no respiratory distress but on oxygen at baseline Abd: soft, benign, no masses. BS+ Ext: No edema.    Planned procedures: Proceed with EGD/colonoscopy. The patient understands the nature of  the planned procedure, indications, risks, alternatives and potential complications including but not limited to bleeding, infection, perforation, damage to internal organs and possible oversedation/side effects from anesthesia. The patient agrees and gives consent to proceed.  Please refer to procedure notes for findings, recommendations and patient disposition/instructions.     Raylene Miyamoto MD, MPH Gastroenterology 02/19/2020  10:32 AM

## 2020-02-19 NOTE — Transfer of Care (Signed)
Immediate Anesthesia Transfer of Care Note  Patient: Tyler [name]  Procedure(s) Performed: ESOPHAGOGASTRODUODENOSCOPY (EGD) (N/A ) COLONOSCOPY (N/A )  Patient Location: PACU  Anesthesia Type:General  Level of Consciousness: awake and sedated  Airway & Oxygen Therapy: Patient Spontanous Breathing and Patient connected to face mask oxygen  Post-op Assessment: Report given to RN and Post -op Vital signs reviewed and stable  Post vital signs: Reviewed and stable  Last Vitals:  Vitals Value Taken Time  BP 123/65 02/19/20 1153  Temp    Pulse 74 02/19/20 1155  Resp 20 02/19/20 1155  SpO2 100 % 02/19/20 1155  Vitals shown include unvalidated device data.  Last Pain:  Vitals:   02/19/20 1153  TempSrc:   PainSc: 0-No pain         Complications: No complications documented.

## 2020-02-19 NOTE — Anesthesia Preprocedure Evaluation (Signed)
Anesthesia Evaluation  Patient identified by MRN, date of birth, ID band Patient awake    Reviewed: Allergy & Precautions, NPO status , Patient's Chart, lab work & pertinent test results  History of Anesthesia Complications Negative for: history of anesthetic complications  Airway Mallampati: II       Dental  (+) Partial Lower   Pulmonary sleep apnea, Continuous Positive Airway Pressure Ventilation and Oxygen sleep apnea , COPD,  COPD inhaler and oxygen dependent, former smoker,           Cardiovascular hypertension, Pt. on medications + CAD, + Past MI and +CHF  + dysrhythmias Atrial Fibrillation + pacemaker + Cardiac Defibrillator (-) Valvular Problems/Murmurs     Neuro/Psych neg Seizures Anxiety Depression    GI/Hepatic Neg liver ROS, GERD  Medicated and Controlled,  Endo/Other  neg diabetes  Renal/GU negative Renal ROS     Musculoskeletal   Abdominal   Peds  Hematology   Anesthesia Other Findings   Reproductive/Obstetrics                            Anesthesia Physical Anesthesia Plan  ASA: III  Anesthesia Plan: General   Post-op Pain Management:    Induction: Intravenous  PONV Risk Score and Plan: 2 and Propofol infusion and TIVA  Airway Management Planned: Nasal Cannula  Additional Equipment:   Intra-op Plan:   Post-operative Plan:   Informed Consent: I have reviewed the patients History and Physical, chart, labs and discussed the procedure including the risks, benefits and alternatives for the proposed anesthesia with the patient or authorized representative who has indicated his/her understanding and acceptance.       Plan Discussed with:   Anesthesia Plan Comments:         Anesthesia Quick Evaluation

## 2020-02-19 NOTE — Op Note (Signed)
[facility]astroenterology Patient Name: Tyler [name] Procedure Date: 02/19/2020 10:37 AM MRN: [id number] Account #: [id number] Date of Birth: 1955/09/17 Admit Type: Outpatient Age: 64 Room: [facility] ENDO ROOM 2 Gender: Male Note Status: Finalized Procedure:             Upper GI endoscopy Indications:           Dysphagia, Gastro-esophageal reflux disease Providers:             Andrey Farmer MD, MD Referring MD:          Andres Labrum, MD (Referring MD) Medicines:             Monitored Anesthesia Care Complications:         No immediate complications. Estimated blood loss: None. Procedure:             Pre-Anesthesia Assessment:                        - Prior to the procedure, a History and Physical was                         performed, and patient medications and allergies were                         reviewed. The patient is competent. The risks and                         benefits of the procedure and the sedation options and                         risks were discussed with the patient. All questions                         were answered and informed consent was obtained.                         Patient identification and proposed procedure were                         verified by the physician, the nurse, the anesthetist                         and the technician in the endoscopy suite. Mental                         Status Examination: alert and oriented. Airway                         Examination: normal oropharyngeal airway and neck                         mobility. Respiratory Examination: clear to                         auscultation. CV Examination: normal. Prophylactic                         Antibiotics: The patient does not require prophylactic  antibiotics. Prior Anticoagulants: The patient has                         taken Coumadin (warfarin), last dose was 5 days prior                         to procedure. ASA Grade  Assessment: III - A patient                         with severe systemic disease. After reviewing the                         risks and benefits, the patient was deemed in                         satisfactory condition to undergo the procedure. The                         anesthesia plan was to use monitored anesthesia care                         (MAC). Immediately prior to administration of                         medications, the patient was re-assessed for adequacy                         to receive sedatives. The heart rate, respiratory                         rate, oxygen saturations, blood pressure, adequacy of                         pulmonary ventilation, and response to care were                         monitored throughout the procedure. The physical                         status of the patient was re-assessed after the                         procedure.                        After obtaining informed consent, the endoscope was                         passed under direct vision. Throughout the procedure,                         the patient's blood pressure, pulse, and oxygen                         saturations were monitored continuously. The Endoscope                         was introduced through the mouth, and advanced to the  second part of duodenum. The upper GI endoscopy was                         accomplished without difficulty. The patient tolerated                         the procedure well. Findings:      White nummular lesions were noted in the upper third of the esophagus.       Brushings for KOH prep were obtained in the upper third of the       esophagus. Estimated blood loss: none.      A small hiatal hernia was present.      The exam of the esophagus was otherwise normal.      The entire examined stomach was normal.      The examined duodenum was normal. Impression:            - White nummular lesions in esophageal mucosa.                          Brushings performed.                        - Small hiatal hernia.                        - Normal stomach.                        - Normal examined duodenum. Recommendation:        - Discharge patient to home.                        - Resume previous diet.                        - Continue present medications.                        - Resume Coumadin (warfarin) at prior dose tomorrow.                        - Await pathology results.                        - Return to referring physician as previously                         scheduled. Procedure Code(s):     --- Professional ---                        [phone], Esophagogastroduodenoscopy, flexible,                         transoral; diagnostic, including collection of                         specimen(s) by brushing or washing, when performed                         (separate procedure) Diagnosis Code(s):     --- Professional ---  K22.8, Other specified diseases of esophagus                        K44.9, Diaphragmatic hernia without obstruction or                         gangrene                        R13.10, Dysphagia, unspecified                        K21.9, Gastro-esophageal reflux disease without                         esophagitis CPT copyright 2019 American Medical Association. All rights reserved. The codes documented in this report are preliminary and upon coder review may  be revised to meet current compliance requirements. Andrey Farmer, MD Andrey Farmer MD, MD 02/19/2020 11:56:48 AM Number of Addenda: 0 Note Initiated On: 02/19/2020 10:37 AM Estimated Blood Loss:  Estimated blood loss: none.      [facility]

## 2020-02-20 ENCOUNTER — Encounter: Payer: Self-pay | Admitting: Gastroenterology

## 2020-02-20 LAB — SURGICAL PATHOLOGY

## 2020-02-22 DIAGNOSIS — J441 Chronic obstructive pulmonary disease with (acute) exacerbation: Secondary | ICD-10-CM | POA: Diagnosis not present

## 2020-02-22 DIAGNOSIS — J9610 Chronic respiratory failure, unspecified whether with hypoxia or hypercapnia: Secondary | ICD-10-CM | POA: Diagnosis not present

## 2020-02-24 DIAGNOSIS — M5000 Cervical disc disorder with myelopathy, unspecified cervical region: Secondary | ICD-10-CM | POA: Diagnosis not present

## 2020-02-24 DIAGNOSIS — J441 Chronic obstructive pulmonary disease with (acute) exacerbation: Secondary | ICD-10-CM | POA: Diagnosis not present

## 2020-02-27 ENCOUNTER — Other Ambulatory Visit: Payer: Medicare HMO | Admitting: Primary Care

## 2020-02-27 ENCOUNTER — Other Ambulatory Visit: Payer: Self-pay

## 2020-02-27 DIAGNOSIS — Z89611 Acquired absence of right leg above knee: Secondary | ICD-10-CM

## 2020-02-27 DIAGNOSIS — M5000 Cervical disc disorder with myelopathy, unspecified cervical region: Secondary | ICD-10-CM

## 2020-02-27 DIAGNOSIS — J449 Chronic obstructive pulmonary disease, unspecified: Secondary | ICD-10-CM | POA: Diagnosis not present

## 2020-02-27 DIAGNOSIS — I482 Chronic atrial fibrillation, unspecified: Secondary | ICD-10-CM | POA: Diagnosis not present

## 2020-02-27 DIAGNOSIS — Z515 Encounter for palliative care: Secondary | ICD-10-CM

## 2020-02-27 NOTE — Progress Notes (Signed)
[facility] Consult Note Telephone: [phone]  Fax: [phone]  PATIENT NAME: Tyler [name] [address] [place] [address] [id number] (home)  DOB: [birth year] MRN: [id number]  PRIMARY CARE PROVIDER:    [name], MD,  [place] [address] [phone]  REFERRING PROVIDER:   [name], MD [place],  [place] [address] [phone]  RESPONSIBLE PARTY:   Extended Emergency Contact Information Primary Emergency Contact: Abdias, Hickam Address: [place], [place] [address] Johnnette Litter of [place] Phone: [phone] Mobile Phone: [phone] Relation: Spouse Secondary Emergency Contact: Thomasenia Bottoms Mobile Phone: [phone] Relation: Sister  I met face to face with patient and family in  home.  ASSESSMENT AND RECOMMENDATIONS:   1. Advance Care Planning/Goals of Care: Goals include to maximize quality of life and symptom management. Our advance care planning conversation included a discussion about:     Review of an  advance directive document - No new updates.   2. Symptom Management:   Dyspnea: Poor exercise tolerance.  Stopped smoking 7 years ago. Endorses occ. desiring of smoking.  States pulmonary told him his lung capacity is very poor. He has a protocol to use if he perceives a COPD exacerbation. We discussed any immunizations that could benefit him. He still needs shingrix. Discussed new to daliresp, prophylactic abx.   Mobility: In w/c now with hand rings but this will not go down his hall. He looks forward to prosthesis so he can mobilize with his prosthetic foot in transport chair. Denies falls. States he does not use crutches due to fall and injury risks.  Wound healing; Endorses good healing of Left BKA. He has compression stocking on for prep for prosthesis.F/u with vascular for wound management.   3. Follow up Palliative Care  Visit: Palliative care will continue to follow for goals of care clarification and symptom management. Return 3-4 months or prn.  4. Family /Caregiver/Community Supports: [place]   5. Cognitive / Functional decline:  A and O x 3, makes own medical decisions. Can do many adls, iadls. Needs assistance with some transportation, transfer functions.  I spent 40 minutes providing this consultation,  from 1215 to 1355. More than 50% of the time in this consultation was spent coordinating communication.   CHIEF COMPLAINT: immobility  HISTORY OF PRESENT ILLNESS:  Tyler [name] is a 64 y.o. year old male with multiple medical problems including Bil amputee, R AKA, L BKA. PVD, COPD, oxygen dependent, CHF. Palliative Care was asked to follow this patient by consultation request of [name], MD to help address advance care planning and goals of care. This is a follow up visit.  CODE STATUS: FULL   PPS: 60%  HOSPICE ELIGIBILITY/DIAGNOSIS: no  PHYSICAL EXAM / ROS:   Current and past weights: unavailable General: NAD, frail appearing, thin Cardiovascular: no chest pain reported Pulmonary: no cough, endorses increased SOB, endorses increased DOE,  oxygen 2 L / [place] Abdomen: appetite good, continent of bowel GU: denies dysuria, continent of urine MSK:  + joint and ROM abnormalities,bil amputee: BKA on left, AKA on Right. non-ambulatory Skin: slight L BKA wound reported Neurological: Weakness , denies pain, denies insomnia, A and O x 3  CURRENT PROBLEM LIST:  Patient Active Problem List   Diagnosis Date Noted  . Hx of BKA, left ([place]) 01/23/2020  .  Atherosclerosis of artery of extremity with gangrene ([place]) 11/22/2019  . Chronic respiratory failure with hypoxia ([place]) 11/02/2019  . BPH (benign prostatic hyperplasia) 10/25/2019  . Depression with anxiety 10/25/2019  . Osteomyelitis of left leg ([place]) 10/18/2019  . Leg swelling 03/26/2019  . Palliative care by  specialist 02/16/2019  . Atherosclerosis of native arteries of the extremities with ulceration ([place]) 01/31/2019  . Postoperative seroma of subcutaneous tissue after non-dermatologic procedure 02/04/2018  . Current mild episode of major depressive disorder without prior episode ([place]) 12/14/2017  . Atherosclerosis of artery of extremity with rest pain ([place]) 08/11/2017  . COPD (chronic obstructive pulmonary disease) ([place]) 06/22/2017  . Cervical disc disease with myelopathy 05/28/2017  . COPD with acute exacerbation ([place]) 10/13/2016  . Acute on chronic respiratory failure with hypoxia ([place]) 10/13/2016  . Acute on chronic systolic (congestive) heart failure ([place]) 10/13/2016  . AF (paroxysmal atrial fibrillation) ([place]) 10/13/2016  . Hx of AKA (above knee amputation), right ([place]) 05/15/2016  . Essential hypertension, benign 05/15/2016  . PVD (peripheral vascular disease) ([place]) 05/15/2016  . Atherosclerotic peripheral vascular disease with intermittent claudication ([place]) 08/01/2014  . Moderate tricuspid insufficiency 08/01/2014  . Mixed hyperlipidemia 07/23/2014  . AICD (automatic cardioverter/defibrillator) present 06/12/2011  . Factor V Leiden mutation ([place]) 06/12/2011  . Coronary artery disease 06/12/2011   PAST MEDICAL HISTORY:  Past Medical History:  Diagnosis Date  . AICD (automatic cardioverter/defibrillator) present   . Anxiety   . Arthritis   . Atherosclerosis of artery of extremity with ulceration ([place]) 09/2019   left foot s/p toe amp requiring debridement and futher toe amputations.  . Atrial fibrillation ([place])   . Cervical spinal stenosis    with neuropathy  . CHF (congestive heart failure) ([place])   . Constipation   . COPD (chronic obstructive pulmonary disease) ([place])   . Coronary artery disease   . Depression   . Dyspnea   . Dysrhythmia    atrial fibrillation  . Emphysema of lung ([place])   . Factor 5 Leiden mutation, heterozygous ([place])    on coumadin  . Factor V Leiden  mutation ([place])   . GERD (gastroesophageal reflux disease)   . Hypertension   . Lung nodule seen on imaging study    being followed by dr. [name]. just watching it for last few years, without change  . Mitral valve insufficiency   . Moderate tricuspid insufficiency   . Myocardial infarction [facility]) 2004   stent placed, pacemaker implanted 2005  . Osteomyelitis ([place]) 10/2019   left foot  . Oxygen dependent    requires 2L nasal prong oxygen 24 hours a day  . Peripheral vascular disease ([place])   . Presence of permanent cardiac pacemaker 2005,2018  . PVD (peripheral vascular disease) ([place])   . Sleep apnea    waiting to have sleep study. Used bipap while hospitalized and said it was great for him.    SOCIAL HX:  Social History   Tobacco Use  . Smoking status: Former Smoker    Packs/day: 1.00    Years: 42.00    Pack years: 42.00    Types: Cigarettes    Quit date: 09/23/2013    Years since quitting: 6.4  . Smokeless tobacco: Never Used  Substance Use Topics  . Alcohol use: No    Comment: quit 6 years ago   FAMILY HX:  Family History  Problem Relation Age of Onset  . Cancer Mother   . Cancer Father   . Heart disease Father  ALLERGIES:  Allergies  Allergen Reactions  . Apixaban Rash  . Rivaroxaban Rash     PERTINENT MEDICATIONS:  Outpatient Encounter Medications as of 02/27/2020  Medication Sig  . acetaminophen (TYLENOL) 500 MG tablet Take 1,000 mg by mouth daily as needed for moderate pain or headache.  . ALPRAZolam (XANAX) 1 MG tablet Take 1 mg by mouth 2 (two) times daily as needed for anxiety or sleep.   Marland Kitchen aspirin EC 81 MG tablet Take 1 tablet (81 mg total) by mouth daily. (Patient taking differently: Take 81 mg by mouth daily after supper. )  . budesonide (PULMICORT) 0.25 MG/2ML nebulizer solution Inhale into the lungs.  . citalopram (CELEXA) 10 MG tablet Take 10 mg by mouth daily.  Marland Kitchen docusate sodium (COLACE) 100 MG capsule Take 100 mg by mouth daily as needed (for  constipation.).   Marland Kitchen ENTRESTO 24-26 MG Take 1 tablet by mouth 2 (two) times daily.  . fluticasone (FLONASE) 50 MCG/ACT nasal spray Place 2 sprays into both nostrils daily.  . fluticasone-salmeterol (ADVAIR HFA) 115-21 MCG/ACT inhaler USE 2 INHALATIONS ORALLY EVERY 12 HOURS (Patient taking differently: Inhale 2 puffs into the lungs every 12 (twelve) hours. )  . furosemide (LASIX) 20 MG tablet Take 2 tablets (40 mg total) by mouth daily. (Patient taking differently: Take 20 mg by mouth daily. )  . gabapentin (NEURONTIN) 100 MG capsule Take 300 mg by mouth in the morning, at noon, and at bedtime.  Marland Kitchen guaiFENesin (MUCINEX) 600 MG 12 hr tablet Take 600 mg by mouth every evening.   Marland Kitchen ipratropium (ATROVENT) 0.03 % nasal spray Place 2 sprays into both nostrils 2 (two) times daily.   . Ipratropium-Albuterol (COMBIVENT RESPIMAT) 20-100 MCG/ACT AERS respimat Inhale 1 puff into the lungs every 4 (four) hours.   Marland Kitchen levalbuterol (XOPENEX) 1.25 MG/3ML nebulizer solution Take 1.25 mg (3 mLs total) by nebulization every 4 (four) hours. (Patient taking differently: Take 3 mLs by nebulization every 4 (four) hours as needed for wheezing or shortness of breath. )  . loratadine (CLARITIN) 10 MG tablet Take 10 mg by mouth at bedtime.  . lovastatin (MEVACOR) 20 MG tablet Take 20 mg by mouth at bedtime.  . metoprolol succinate (TOPROL-XL) 25 MG 24 hr tablet Take 25 mg by mouth daily.  . mupirocin ointment (BACTROBAN) 2 % Apply 1 application topically daily.  . nitroGLYCERIN (NITROSTAT) 0.4 MG SL tablet Place 0.4 mg under the tongue every 5 (five) minutes as needed for chest pain.  Marland Kitchen oxyCODONE-acetaminophen (PERCOCET) 7.5-325 MG tablet Take 1-2 tablets by mouth every 6 (six) hours as needed for moderate pain.  . pantoprazole (PROTONIX) 40 MG tablet Take 40 mg by mouth daily before breakfast.   . Respiratory Therapy Supplies (FLUTTER) DEVI Use as directed.  . roflumilast (DALIRESP) 500 MCG TABS tablet Take 500 mcg by mouth  daily.  . sildenafil (VIAGRA) 25 MG tablet Take by mouth.  . SPIRIVA RESPIMAT 1.25 MCG/ACT AERS INHALE 2 PUFFS BY MOUTH INTO THE LUNGS DAILY  . tamsulosin (FLOMAX) 0.4 MG CAPS capsule Take 0.4 mg by mouth daily after supper.   . vitamin B-12 (CYANOCOBALAMIN) 1000 MCG tablet Take 1,000 mcg by mouth daily.  Marland Kitchen warfarin (COUMADIN) 5 MG tablet Take 5 mg by mouth daily.  . Wound Dressings (RESTORE WOUND CARE DRESSING) PADS Apply 1 each topically daily.    No facility-administered encounter medications on file as of 02/27/2020.      Jason Coop, NP , DNP, MPH, [facility]  COVID-19 PATIENT  SCREENING TOOL  Person answering questions: ____________self______ _____   1.  Is the patient or any family member in the home showing any signs or symptoms regarding respiratory infection?               Person with Symptom- __________NA_________________  a. Fever                                                                          Yes___ No___          ___________________  b. Shortness of breath                                                    Yes___ No___          ___________________ c. Cough/congestion                                       Yes___  No___         ___________________ d. Body aches/pains                                                         Yes___ No___        ____________________ e. Gastrointestinal symptoms (diarrhea, nausea)           Yes___ No___        ____________________  2. Within the past 14 days, has anyone living in the home had any contact with someone with or under investigation for COVID-19?    Yes___ No_X_   Person __________________

## 2020-03-04 ENCOUNTER — Other Ambulatory Visit: Payer: Self-pay

## 2020-03-04 NOTE — Patient Outreach (Signed)
[facility]) Care Management  [place]  03/04/2020   Tyler [name] [birth year] [id number]  Subjective: Telephone call to patient for disease management follow up.  Patient reports he is doing good.  He states that he now has his stump shrinker and is closer to getting fitted for a prothesis.  Patient reports he does get winded with activity. We discussed COPD management.  He verbalized understanding and voices no concerns. Patient did have colonoscopy done and will need repeat in 6 months due to polyp found. Patient happy with health status at this time.    Objective:   Encounter Medications:  Outpatient Encounter Medications as of 03/04/2020  Medication Sig Note  . acetaminophen (TYLENOL) 500 MG tablet Take 1,000 mg by mouth daily as needed for moderate pain or headache.   . ALPRAZolam (XANAX) 1 MG tablet Take 1 mg by mouth 2 (two) times daily as needed for anxiety or sleep.    Marland Kitchen aspirin EC 81 MG tablet Take 1 tablet (81 mg total) by mouth daily. (Patient taking differently: Take 81 mg by mouth daily after supper. )   . budesonide (PULMICORT) 0.25 MG/2ML nebulizer solution Inhale into the lungs.   . citalopram (CELEXA) 10 MG tablet Take 10 mg by mouth daily.   Marland Kitchen docusate sodium (COLACE) 100 MG capsule Take 100 mg by mouth daily as needed (for constipation.).    Marland Kitchen ENTRESTO 24-26 MG Take 1 tablet by mouth 2 (two) times daily.   . fluticasone (FLONASE) 50 MCG/ACT nasal spray Place 2 sprays into both nostrils daily.   . fluticasone-salmeterol (ADVAIR HFA) 115-21 MCG/ACT inhaler USE 2 INHALATIONS ORALLY EVERY 12 HOURS (Patient taking differently: Inhale 2 puffs into the lungs every 12 (twelve) hours. )   . furosemide (LASIX) 20 MG tablet Take 2 tablets (40 mg total) by mouth daily. (Patient taking differently: Take 20 mg by mouth daily. )   . gabapentin (NEURONTIN) 100 MG capsule Take 300 mg by mouth in the morning, at noon, and at bedtime.   Marland Kitchen guaiFENesin (MUCINEX)  600 MG 12 hr tablet Take 600 mg by mouth every evening.    Marland Kitchen ipratropium (ATROVENT) 0.03 % nasal spray Place 2 sprays into both nostrils 2 (two) times daily.    . Ipratropium-Albuterol (COMBIVENT RESPIMAT) 20-100 MCG/ACT AERS respimat Inhale 1 puff into the lungs every 4 (four) hours.  07/27/2019: Pt confirmed he takes every 4 hours  . levalbuterol (XOPENEX) 1.25 MG/3ML nebulizer solution Take 1.25 mg (3 mLs total) by nebulization every 4 (four) hours. (Patient taking differently: Take 3 mLs by nebulization every 4 (four) hours as needed for wheezing or shortness of breath. )   . loratadine (CLARITIN) 10 MG tablet Take 10 mg by mouth at bedtime.   . lovastatin (MEVACOR) 20 MG tablet Take 20 mg by mouth at bedtime.   . metoprolol succinate (TOPROL-XL) 25 MG 24 hr tablet Take 25 mg by mouth daily.   . mupirocin ointment (BACTROBAN) 2 % Apply 1 application topically daily.   . nitroGLYCERIN (NITROSTAT) 0.4 MG SL tablet Place 0.4 mg under the tongue every 5 (five) minutes as needed for chest pain.   Marland Kitchen oxyCODONE-acetaminophen (PERCOCET) 7.5-325 MG tablet Take 1-2 tablets by mouth every 6 (six) hours as needed for moderate pain.   . pantoprazole (PROTONIX) 40 MG tablet Take 40 mg by mouth daily before breakfast.    . Respiratory Therapy Supplies (FLUTTER) DEVI Use as directed.   . roflumilast (DALIRESP) 500 MCG TABS  tablet Take 500 mcg by mouth daily.   . sildenafil (VIAGRA) 25 MG tablet Take by mouth.   . SPIRIVA RESPIMAT 1.25 MCG/ACT AERS INHALE 2 PUFFS BY MOUTH INTO THE LUNGS DAILY   . tamsulosin (FLOMAX) 0.4 MG CAPS capsule Take 0.4 mg by mouth daily after supper.    . vitamin B-12 (CYANOCOBALAMIN) 1000 MCG tablet Take 1,000 mcg by mouth daily.   Marland Kitchen warfarin (COUMADIN) 5 MG tablet Take 5 mg by mouth daily.   . Wound Dressings (RESTORE WOUND CARE DRESSING) PADS Apply 1 each topically daily.     No facility-administered encounter medications on file as of 03/04/2020.    Functional Status:  In your  present state of health, do you have any difficulty performing the following activities: 02/01/2020 12/20/2019  Hearing? N N  Vision? N N  Difficulty concentrating or making decisions? N N  Walking or climbing stairs? [name]  Comment Patient double amputee -  Dressing or bathing? N Y  Doing errands, shopping? Y N  Comment Wife assists -  Conservation officer, nature and eating ? N -  Using the Toilet? N -  In the past six months, have you accidently leaked urine? N -  Do you have problems with loss of bowel control? N -  Managing your Medications? N -  Managing your Finances? Y -  Comment wife assists -  Housekeeping or managing your Housekeeping? Y -  Comment wife assists -  Some recent data might be hidden    Fall/Depression Screening: Fall Risk  02/01/2020 12/20/2019 12/01/2019  Falls in the past year? 1 1 1   Number falls in past yr: 0 0 0  Injury with Fall? 0 0 0  Risk for fall due to : - History of fall(s) -  Follow up - Falls evaluation completed -   PHQ 2/9 Scores 02/01/2020 12/01/2019 10/30/2019  PHQ - 2 Score 0 0 0    Assessment: Patient managing chronic conditions and benefits from disease management follow up.   Goals Addressed            This Visit's Progress   . Track and Manage My Symptoms       Follow Up Date 04/23/21   - develop a rescue plan - follow rescue plan if symptoms flare-up - keep follow-up appointments    Why is this important?   Tracking your symptoms and other information about your health helps your doctor plan your care.  Write down the symptoms, the time of day, what you were doing and what medicine you are taking.  You will soon learn how to manage your symptoms.     Notes: Keep up the great work!!       Plan: RN CM will contact patient in the month of November and patient agrees to next outreach.  Tyler Baseman, RN, MSN [place] Management Care Management Coordinator Direct Line [phone] Cell [phone] Toll Free: [phone]  Fax:  [phone]

## 2020-03-06 ENCOUNTER — Ambulatory Visit: Payer: Self-pay

## 2020-03-15 ENCOUNTER — Emergency Department
Admission: EM | Admit: 2020-03-15 | Discharge: 2020-03-15 | Disposition: A | Discharge status: Home / Self Care | Payer: Medicare HMO | Attending: Emergency Medicine | Admitting: Emergency Medicine

## 2020-03-15 ENCOUNTER — Encounter: Payer: Self-pay | Admitting: Emergency Medicine

## 2020-03-15 ENCOUNTER — Other Ambulatory Visit: Payer: Self-pay

## 2020-03-15 DIAGNOSIS — Z7951 Long term (current) use of inhaled steroids: Secondary | ICD-10-CM | POA: Insufficient documentation

## 2020-03-15 DIAGNOSIS — Z7901 Long term (current) use of anticoagulants: Secondary | ICD-10-CM | POA: Diagnosis not present

## 2020-03-15 DIAGNOSIS — I509 Heart failure, unspecified: Secondary | ICD-10-CM | POA: Insufficient documentation

## 2020-03-15 DIAGNOSIS — I110 Hypertensive heart disease with heart failure: Secondary | ICD-10-CM | POA: Diagnosis not present

## 2020-03-15 DIAGNOSIS — I10 Essential (primary) hypertension: Secondary | ICD-10-CM | POA: Diagnosis not present

## 2020-03-15 DIAGNOSIS — I2510 Atherosclerotic heart disease of native coronary artery without angina pectoris: Secondary | ICD-10-CM | POA: Insufficient documentation

## 2020-03-15 DIAGNOSIS — J441 Chronic obstructive pulmonary disease with (acute) exacerbation: Secondary | ICD-10-CM | POA: Diagnosis not present

## 2020-03-15 DIAGNOSIS — Z7982 Long term (current) use of aspirin: Secondary | ICD-10-CM | POA: Insufficient documentation

## 2020-03-15 DIAGNOSIS — R040 Epistaxis: Secondary | ICD-10-CM | POA: Diagnosis not present

## 2020-03-15 DIAGNOSIS — I482 Chronic atrial fibrillation, unspecified: Secondary | ICD-10-CM | POA: Diagnosis not present

## 2020-03-15 DIAGNOSIS — Z87891 Personal history of nicotine dependence: Secondary | ICD-10-CM | POA: Insufficient documentation

## 2020-03-15 DIAGNOSIS — R799 Abnormal finding of blood chemistry, unspecified: Secondary | ICD-10-CM | POA: Diagnosis present

## 2020-03-15 DIAGNOSIS — R791 Abnormal coagulation profile: Secondary | ICD-10-CM | POA: Insufficient documentation

## 2020-03-15 LAB — CBC
HCT: 35.8 % — ABNORMAL LOW (ref 39.0–52.0)
Hemoglobin: 12.1 g/dL — ABNORMAL LOW (ref 13.0–17.0)
MCH: 27.6 pg (ref 26.0–34.0)
MCHC: 33.8 g/dL (ref 30.0–36.0)
MCV: 81.5 fL (ref 80.0–100.0)
Platelets: 239 10*3/uL (ref 150–400)
RBC: 4.39 MIL/uL (ref 4.22–5.81)
RDW: 14.6 % (ref 11.5–15.5)
WBC: 7.8 10*3/uL (ref 4.0–10.5)
nRBC: 0 % (ref 0.0–0.2)

## 2020-03-15 LAB — BASIC METABOLIC PANEL
Anion gap: 9 (ref 5–15)
BUN: 8 mg/dL (ref 8–23)
CO2: 29 mmol/L (ref 22–32)
Calcium: 9.2 mg/dL (ref 8.9–10.3)
Chloride: 95 mmol/L — ABNORMAL LOW (ref 98–111)
Creatinine, Ser: 0.71 mg/dL (ref 0.61–1.24)
GFR, Estimated: >60 mL/min (ref >60)
Glucose, Bld: 100 mg/dL — ABNORMAL HIGH (ref 70–99)
Potassium: 3.9 mmol/L (ref 3.5–5.1)
Sodium: 133 mmol/L — ABNORMAL LOW (ref 135–145)

## 2020-03-15 LAB — PROTIME-INR
INR: 9.8 (ref 0.8–1.2)
Prothrombin Time: 76 seconds — ABNORMAL HIGH (ref 11.4–15.2)

## 2020-03-15 MED ORDER — OXYMETAZOLINE HCL 0.05 % NA SOLN
1.0000 | Freq: Once | NASAL | Status: AC
  Administered 2020-03-15: 1 via NASAL
  Filled 2020-03-15: qty 30

## 2020-03-15 MED ORDER — VITAMIN K1 10 MG/ML IJ SOLN
2.5000 mg | Freq: Once | INTRAVENOUS | Status: AC
  Administered 2020-03-15: 2.5 mg via INTRAVENOUS
  Filled 2020-03-15: qty 0.25

## 2020-03-15 NOTE — ED Triage Notes (Signed)
Pt comes into the ED via POV c/o abnormal labs with an INR over 10.  Pt is currently active bleeding from his nose at this time and it has been ongoing for 2 days.  Pt currently on Warfarin and a new medication which they warned him could alter his INR levels.  Pt is neurologically intact at this time and denies any pain currently.

## 2020-03-15 NOTE — ED Provider Notes (Signed)
[facility] Emergency Department Provider Note   ____________________________________________   First MD Initiated Contact with Patient 03/15/20 1659     (approximate)  I have reviewed the triage vital signs and the nursing notes.   HISTORY  Chief Complaint Abnormal Lab    HPI Tyler [name] is a 64 y.o. male with past medical history of hypertension, COPD on 2 L, CHF, paroxysmal atrial fibrillation, factor V Leiden, peripheral arterial disease, and DVT/PE on Coumadin who presents to the ED complaining of abnormal lab. Patient reports that he was called by his PCPs office and notified that his INR was greater than 10 after routine labs yesterday. He states he has had an on and off nosebleed for the past couple of days and today it has been slowly bleeding from the right side of his nose. He states he has otherwise been feeling well with no blood in his urine or stool. He has not had any lightheadedness, chest pain, or shortness of breath. He does state he was recently started on an antifungal medication by his GI doctor due to candidiasis seen on EGD.        Past Medical History:  Diagnosis Date  . AICD (automatic cardioverter/defibrillator) present   . Anxiety   . Arthritis   . Atherosclerosis of artery of extremity with ulceration ([place]) 09/2019   left foot s/p toe amp requiring debridement and futher toe amputations.  . Atrial fibrillation ([place])   . Cervical spinal stenosis    with neuropathy  . CHF (congestive heart failure) ([place])   . Constipation   . COPD (chronic obstructive pulmonary disease) ([place])   . Coronary artery disease   . Depression   . Dyspnea   . Dysrhythmia    atrial fibrillation  . Emphysema of lung ([place])   . Factor 5 Leiden mutation, heterozygous ([place])    on coumadin  . Factor V Leiden mutation ([place])   . GERD (gastroesophageal reflux disease)   . Hypertension   . Lung nodule seen on imaging study    being followed by dr.  [name]. just watching it for last few years, without change  . Mitral valve insufficiency   . Moderate tricuspid insufficiency   . Myocardial infarction [facility]) 2004   stent placed, pacemaker implanted 2005  . Osteomyelitis ([place]) 10/2019   left foot  . Oxygen dependent    requires 2L nasal prong oxygen 24 hours a day  . Peripheral vascular disease ([place])   . Presence of permanent cardiac pacemaker 2005,2018  . PVD (peripheral vascular disease) ([place])   . Sleep apnea    waiting to have sleep study. Used bipap while hospitalized and said it was great for him.    Patient Active Problem List   Diagnosis Date Noted  . Hx of BKA, left ([place]) 01/23/2020  . Atherosclerosis of artery of extremity with gangrene ([place]) 11/22/2019  . Chronic respiratory failure with hypoxia ([place]) 11/02/2019  . BPH (benign prostatic hyperplasia) 10/25/2019  . Depression with anxiety 10/25/2019  . Osteomyelitis of left leg ([place]) 10/18/2019  . Leg swelling 03/26/2019  . Palliative care by specialist 02/16/2019  . Atherosclerosis of native arteries of the extremities with ulceration ([place]) 01/31/2019  . Postoperative seroma of subcutaneous tissue after non-dermatologic procedure 02/04/2018  . Current mild episode of major depressive disorder without prior episode ([place]) 12/14/2017  . Atherosclerosis of artery of extremity with rest pain ([place]) 08/11/2017  . COPD (chronic obstructive pulmonary disease) ([place]) 06/22/2017  .  Cervical disc disease with myelopathy 05/28/2017  . COPD with acute exacerbation ([place]) 10/13/2016  . Acute on chronic respiratory failure with hypoxia ([place]) 10/13/2016  . Acute on chronic systolic (congestive) heart failure ([place]) 10/13/2016  . AF (paroxysmal atrial fibrillation) ([place]) 10/13/2016  . Hx of AKA (above knee amputation), right ([place]) 05/15/2016  . Essential hypertension, benign 05/15/2016  . PVD (peripheral vascular disease) ([place]) 05/15/2016  . Atherosclerotic peripheral vascular disease with  intermittent claudication ([place]) 08/01/2014  . Moderate tricuspid insufficiency 08/01/2014  . Mixed hyperlipidemia 07/23/2014  . AICD (automatic cardioverter/defibrillator) present 06/12/2011  . Factor V Leiden mutation ([place]) 06/12/2011  . Coronary artery disease 06/12/2011    Past Surgical History:  Procedure Laterality Date  . ABOVE KNEE LEG AMPUTATION Right    after below the knee amputation   . AMPUTATION Left 11/22/2019   Procedure: AMPUTATION BELOW KNEE;  Surgeon: Algernon Huxley, MD;  Location: [facility] ORS;  Service: Vascular;  Laterality: Left;  . AMPUTATION TOE Left 08/04/2019   Procedure: AMPUTATION TOE MPJ LEFT;  Surgeon: Samara Deist, DPM;  Location: [facility] ORS;  Service: Podiatry;  Laterality: Left;  . AMPUTATION TOE Left 10/06/2019   Procedure: AMPUTATION TOE MPJ T1,T2 LEFT;  Surgeon: Samara Deist, DPM;  Location: [facility] ORS;  Service: Podiatry;  Laterality: Left;  . APPLICATION OF WOUND VAC Left 01/12/2018   Procedure: APPLICATION OF WOUND VAC;  Surgeon: Algernon Huxley, MD;  Location: [facility] ORS;  Service: Vascular;  Laterality: Left;  . BELOW KNEE LEG AMPUTATION Right   . CATARACT EXTRACTION, BILATERAL Bilateral   . COLONOSCOPY N/A 02/19/2020   Procedure: COLONOSCOPY;  Surgeon: Lesly Rubenstein, MD;  Location: [facility] ENDOSCOPY;  Service: Endoscopy;  Laterality: N/A;  . CORONARY ANGIOPLASTY  2005   stent x 1 placed  . ENDARTERECTOMY FEMORAL Left 08/11/2017   Procedure: ENDARTERECTOMY FEMORAL;  Surgeon: Algernon Huxley, MD;  Location: [facility] ORS;  Service: Vascular;  Laterality: Left;  . ESOPHAGOGASTRODUODENOSCOPY N/A 02/19/2020   Procedure: ESOPHAGOGASTRODUODENOSCOPY (EGD);  Surgeon: Lesly Rubenstein, MD;  Location: [facility] ENDOSCOPY;  Service: Endoscopy;  Laterality: N/A;  . HEMATOMA EVACUATION Left 01/12/2018   Procedure: EVACUATION HEMATOMA ( DRAINING OF SEROMA);  Surgeon: Algernon Huxley, MD;  Location: [facility] ORS;  Service: Vascular;  Laterality: Left;  . IMPLANTABLE CARDIOVERTER  DEFIBRILLATOR (ICD) GENERATOR CHANGE Left 02/10/2017   Procedure: ICD GENERATOR CHANGE;  Surgeon: Isaias Cowman, MD;  Location: [facility] ORS;  Service: Cardiovascular;  Laterality: Left;  . INSERT / REPLACE / REMOVE PACEMAKER  [id number], 2018  . LOWER EXTREMITY ANGIOGRAPHY Left 06/07/2017   Procedure: LOWER EXTREMITY ANGIOGRAPHY;  Surgeon: Algernon Huxley, MD;  Location: [place] CV LAB;  Service: Cardiovascular;  Laterality: Left;  . LOWER EXTREMITY ANGIOGRAPHY Left 10/05/2019   Procedure: LOWER EXTREMITY ANGIOGRAPHY;  Surgeon: Algernon Huxley, MD;  Location: [place] CV LAB;  Service: Cardiovascular;  Laterality: Left;  . LOWER EXTREMITY INTERVENTION  06/07/2017   Procedure: LOWER EXTREMITY INTERVENTION;  Surgeon: Algernon Huxley, MD;  Location: [place] CV LAB;  Service: Cardiovascular;;  . PERIPHERAL VASCULAR CATHETERIZATION Left 12/09/2015   Procedure: Lower Extremity Angiography;  Surgeon: Algernon Huxley, MD;  Location: [place] CV LAB;  Service: Cardiovascular;  Laterality: Left;  . PERIPHERAL VASCULAR CATHETERIZATION  12/09/2015   Procedure: Lower Extremity Intervention;  Surgeon: Algernon Huxley, MD;  Location: [place] CV LAB;  Service: Cardiovascular;;  . TONSILLECTOMY    . TRANSMETATARSAL AMPUTATION Left 10/20/2019   Procedure: TRANSMETATARSAL AMPUTATION;  Surgeon:  Sharlotte Alamo, DPM;  Location: [facility] ORS;  Service: Podiatry;  Laterality: Left;    Prior to Admission medications   Medication Sig Start Date End Date Taking? Authorizing Provider  acetaminophen (TYLENOL) 500 MG tablet Take 1,000 mg by mouth daily as needed for moderate pain or headache.    [provider]  ALPRAZolam Duanne Moron) 1 MG tablet Take 1 mg by mouth 2 (two) times daily as needed for anxiety or sleep.     [provider]  aspirin EC 81 MG tablet Take 1 tablet (81 mg total) by mouth daily. Patient taking differently: Take 81 mg by mouth daily after supper.  12/09/15   Algernon Huxley, MD    budesonide (PULMICORT) 0.25 MG/2ML nebulizer solution Inhale into the lungs. 12/11/19 12/10/20  [provider]  citalopram (CELEXA) 10 MG tablet Take 10 mg by mouth daily.    [provider]  docusate sodium (COLACE) 100 MG capsule Take 100 mg by mouth daily as needed (for constipation.).     [provider]  ENTRESTO 24-26 MG Take 1 tablet by mouth 2 (two) times daily. 11/03/19   Alisa Graff, FNP  fluticasone (FLONASE) 50 MCG/ACT nasal spray Place 2 sprays into both nostrils daily.    [provider]  fluticasone-salmeterol (ADVAIR HFA) 115-21 MCG/ACT inhaler USE 2 INHALATIONS ORALLY EVERY 12 HOURS Patient taking differently: Inhale 2 puffs into the lungs every 12 (twelve) hours.  05/30/18   Flora Lipps, MD  furosemide (LASIX) 20 MG tablet Take 2 tablets (40 mg total) by mouth daily. Patient taking differently: Take 20 mg by mouth daily.  10/29/19   Lorella Nimrod, MD  gabapentin (NEURONTIN) 100 MG capsule Take 300 mg by mouth in the morning, at noon, and at bedtime. 09/20/19   [provider]  guaiFENesin (MUCINEX) 600 MG 12 hr tablet Take 600 mg by mouth every evening.     [provider]  ipratropium (ATROVENT) 0.03 % nasal spray Place 2 sprays into both nostrils 2 (two) times daily.     [provider]  Ipratropium-Albuterol (COMBIVENT RESPIMAT) 20-100 MCG/ACT AERS respimat Inhale 1 puff into the lungs every 4 (four) hours.     [provider]  levalbuterol (XOPENEX) 1.25 MG/3ML nebulizer solution Take 1.25 mg (3 mLs total) by nebulization every 4 (four) hours. Patient taking differently: Take 3 mLs by nebulization every 4 (four) hours as needed for wheezing or shortness of breath.  10/23/16   Theodoro Grist, MD  loratadine (CLARITIN) 10 MG tablet Take 10 mg by mouth at bedtime.    [provider]  lovastatin (MEVACOR) 20 MG tablet Take 20 mg by mouth at bedtime.    [provider]  metoprolol succinate  (TOPROL-XL) 25 MG 24 hr tablet Take 25 mg by mouth daily.    [provider]  mupirocin ointment (BACTROBAN) 2 % Apply 1 application topically daily. 01/01/20   Kris Hartmann, NP  nitroGLYCERIN (NITROSTAT) 0.4 MG SL tablet Place 0.4 mg under the tongue every 5 (five) minutes as needed for chest pain.    [provider]  oxyCODONE-acetaminophen (PERCOCET) 7.5-325 MG tablet Take 1-2 tablets by mouth every 6 (six) hours as needed for moderate pain. 11/30/19   Stegmayer, Joelene Millin A, PA-C  pantoprazole (PROTONIX) 40 MG tablet Take 40 mg by mouth daily before breakfast.     [provider]  Respiratory Therapy Supplies (FLUTTER) DEVI Use as directed. 05/15/19   Magdalen Spatz, NP  roflumilast (DALIRESP) 500  MCG TABS tablet Take 500 mcg by mouth daily.    [provider]  sildenafil (VIAGRA) 25 MG tablet Take by mouth. 12/20/19 01/19/20  [provider]  SPIRIVA RESPIMAT 1.25 MCG/ACT AERS INHALE 2 PUFFS BY MOUTH INTO THE LUNGS DAILY 05/29/19   Flora Lipps, MD  tamsulosin (FLOMAX) 0.4 MG CAPS capsule Take 0.4 mg by mouth daily after supper.  03/07/18   [provider]  vitamin B-12 (CYANOCOBALAMIN) 1000 MCG tablet Take 1,000 mcg by mouth daily.    [provider]  warfarin (COUMADIN) 5 MG tablet Take 5 mg by mouth daily.    [provider]  Wound Dressings (RESTORE WOUND CARE DRESSING) PADS Apply 1 each topically daily.  01/25/18   [provider]    Allergies Apixaban and Rivaroxaban  Family History  Problem Relation Age of Onset  . Cancer Mother   . Cancer Father   . Heart disease Father     Social History Social History   Tobacco Use  . Smoking status: Former Smoker    Packs/day: 1.00    Years: 42.00    Pack years: 42.00    Types: Cigarettes    Quit date: 09/23/2013    Years since quitting: 6.4  . Smokeless tobacco: Never Used  Vaping Use  . Vaping Use: Never used  Substance Use Topics  . Alcohol use: No     Comment: quit 6 years ago  . Drug use: No    Review of Systems  Constitutional: No fever/chills Eyes: No visual changes. ENT: No sore throat. Positive for nosebleed. Cardiovascular: Denies chest pain. Respiratory: Denies shortness of breath. Gastrointestinal: No abdominal pain.  No nausea, no vomiting.  No diarrhea.  No constipation. Genitourinary: Negative for dysuria. Musculoskeletal: Negative for back pain. Skin: Negative for rash. Neurological: Negative for headaches, focal weakness or numbness.  ____________________________________________   PHYSICAL EXAM:  VITAL SIGNS: ED Triage Vitals [03/15/20 1628]  Enc Vitals Group     BP 114/65     Pulse Rate 77     Resp 16     Temp 98.2 F (36.8 C)     Temp Source Oral     SpO2 93 %     Weight 146 lb (66.2 kg)     Height 6\' 1"  (1.854 m)     Head Circumference      Peak Flow      Pain Score 3     Pain Loc      Pain Edu?      Excl. in [place]?     Constitutional: Alert and oriented. Eyes: Conjunctivae are normal. Head: Atraumatic. Nose: No congestion/rhinnorhea. Small area of very light bleeding noted in right nare. Mouth/Throat: Mucous membranes are moist. Neck: Normal ROM Cardiovascular: Normal rate, regular rhythm. Grossly normal heart sounds. Respiratory: Normal respiratory effort.  No retractions. Lungs CTAB. Gastrointestinal: Soft and nontender. No distention. Genitourinary: deferred Musculoskeletal: No lower extremity tenderness nor edema. Status post bilateral BKA. Neurologic:  Normal speech and language. No gross focal neurologic deficits are appreciated. Skin:  Skin is warm, dry and intact. No rash noted. Psychiatric: Mood and affect are normal. Speech and behavior are normal.  ____________________________________________   LABS (all labs ordered are listed, but only abnormal results are displayed)  Labs Reviewed  BASIC METABOLIC PANEL - Abnormal; Notable for the following components:      Result Value    Sodium 133 (*)    Chloride 95 (*)    Glucose, Bld 100 (*)  All other components within normal limits  CBC - Abnormal; Notable for the following components:   Hemoglobin 12.1 (*)    HCT 35.8 (*)    All other components within normal limits  PROTIME-INR - Abnormal; Notable for the following components:   Prothrombin Time 76.0 (*)    INR 9.8 (*)    All other components within normal limits    PROCEDURES  Procedure(s) performed (including Critical Care):  .Epistaxis Management  Date/Time: 03/15/2020 6:00 PM Performed by: Blake Divine, MD Authorized by: Blake Divine, MD   Consent:    Consent obtained:  Verbal   Consent given by:  Patient   Risks discussed:  Bleeding, nasal injury and pain Anesthesia (see MAR for exact dosages):    Anesthesia method:  None Procedure details:    Treatment site:  R septum   Repair method: Afrin and pressure.   Treatment complexity:  Limited   Treatment episode: initial   Post-procedure details:    Assessment:  Bleeding stopped   Patient tolerance of procedure:  Tolerated well, no immediate complications     ____________________________________________   INITIAL IMPRESSION / ASSESSMENT AND PLAN / ED COURSE       64 year old male with past medical history of COPD on 2 L, hypertension, CHF, paroxysmal atrial fibrillation, factor V Leiden, peripheral arterial disease, and DVT/PE on Coumadin who presents to the ED after he was notified by his PCP that his INR was greater than 10 with some epistaxis. He has very mild bleeding from his right nare, likely due to irritation from his nasal cannula. Will apply Afrin and pressure to alleviate this bleeding. He denies heavier bleeding at any point over the past couple of days, H&H is stable and he denies any symptoms of anemia. He has not had any other sources of bleeding recently. Given INR greater than 10 in the setting of bleeding, we will treat with IV vitamin K and monitor closely.  Patient  with no further epistaxis following application of Afrin and pressure, he was counseled to keep nose moist with Vaseline at home in order to prevent rebleeding.  INR noted to be 9.8 here in the ED, patient was given dose of IV vitamin K and counseled to hold his Coumadin until he is able to have his INR rechecked by his PCP.  Given no recurrence of bleeding over multiple hours of observation, patient is appropriate for discharge home with PCP follow-up.  Patient agrees with plan.      ____________________________________________   FINAL CLINICAL IMPRESSION(S) / ED DIAGNOSES  Final diagnoses:  Supratherapeutic INR  Epistaxis     ED Discharge Orders    None       Note:  This document was prepared using Dragon voice recognition software and may include unintentional dictation errors.   Blake Divine, MD 03/15/20 2017

## 2020-03-15 NOTE — ED Notes (Signed)
Dr. Charna Archer in room with patient administering Afrin with ENT cart at bedside

## 2020-03-15 NOTE — Discharge Instructions (Signed)
Please call your primary doctor's office first in the morning to schedule an appointment for recheck of your INR.  You should hold your Coumadin until you are able to have your INR rechecked and you are reevaluated by your PCP.  If you have any worsening bleeding from your nose or anywhere else, please return to the ER for reevaluation.

## 2020-03-18 DIAGNOSIS — D6832 Hemorrhagic disorder due to extrinsic circulating anticoagulants: Secondary | ICD-10-CM | POA: Diagnosis not present

## 2020-03-18 DIAGNOSIS — I482 Chronic atrial fibrillation, unspecified: Secondary | ICD-10-CM | POA: Diagnosis not present

## 2020-03-22 DIAGNOSIS — I482 Chronic atrial fibrillation, unspecified: Secondary | ICD-10-CM | POA: Diagnosis not present

## 2020-03-22 DIAGNOSIS — D6832 Hemorrhagic disorder due to extrinsic circulating anticoagulants: Secondary | ICD-10-CM | POA: Diagnosis not present

## 2020-03-22 DIAGNOSIS — T45515A Adverse effect of anticoagulants, initial encounter: Secondary | ICD-10-CM | POA: Diagnosis not present

## 2020-03-23 DIAGNOSIS — J9610 Chronic respiratory failure, unspecified whether with hypoxia or hypercapnia: Secondary | ICD-10-CM | POA: Diagnosis not present

## 2020-03-23 DIAGNOSIS — J441 Chronic obstructive pulmonary disease with (acute) exacerbation: Secondary | ICD-10-CM | POA: Diagnosis not present

## 2020-03-24 DIAGNOSIS — J441 Chronic obstructive pulmonary disease with (acute) exacerbation: Secondary | ICD-10-CM | POA: Diagnosis not present

## 2020-03-26 DIAGNOSIS — L720 Epidermal cyst: Secondary | ICD-10-CM | POA: Diagnosis not present

## 2020-03-26 DIAGNOSIS — J441 Chronic obstructive pulmonary disease with (acute) exacerbation: Secondary | ICD-10-CM | POA: Diagnosis not present

## 2020-03-26 DIAGNOSIS — M5000 Cervical disc disorder with myelopathy, unspecified cervical region: Secondary | ICD-10-CM | POA: Diagnosis not present

## 2020-03-26 DIAGNOSIS — L089 Local infection of the skin and subcutaneous tissue, unspecified: Secondary | ICD-10-CM | POA: Diagnosis not present

## 2020-04-01 DIAGNOSIS — L720 Epidermal cyst: Secondary | ICD-10-CM | POA: Diagnosis not present

## 2020-04-01 DIAGNOSIS — I482 Chronic atrial fibrillation, unspecified: Secondary | ICD-10-CM | POA: Diagnosis not present

## 2020-04-01 DIAGNOSIS — L089 Local infection of the skin and subcutaneous tissue, unspecified: Secondary | ICD-10-CM | POA: Diagnosis not present

## 2020-04-05 ENCOUNTER — Other Ambulatory Visit: Payer: Self-pay

## 2020-04-05 NOTE — Patient Outreach (Signed)
[facility]) Care Management  [place]  04/05/2020   Tyler [name] Nov 27, 1955 [id number]  Subjective: Telephone call to patient for disease management follow up. Patient reports he is doing good.  Has a cyst to his back which he saw the surgeon and area is healing good.  He states he has a follow up next week but might not need it due to healing.  Patient reports being casted for a prothesis so he is looking forward to a prothesis.  He reports his breathing is about the same and that he paces himself.  Discussed COPD management and control.  He verbalized understanding and voices no concerns.    Objective:   Encounter Medications:  Outpatient Encounter Medications as of 04/05/2020  Medication Sig Note  . acetaminophen (TYLENOL) 500 MG tablet Take 1,000 mg by mouth daily as needed for moderate pain or headache.   . ALPRAZolam (XANAX) 1 MG tablet Take 1 mg by mouth 2 (two) times daily as needed for anxiety or sleep.    Marland Kitchen aspirin EC 81 MG tablet Take 1 tablet (81 mg total) by mouth daily. (Patient taking differently: Take 81 mg by mouth daily after supper. )   . budesonide (PULMICORT) 0.25 MG/2ML nebulizer solution Inhale into the lungs.   . citalopram (CELEXA) 10 MG tablet Take 10 mg by mouth daily.   Marland Kitchen docusate sodium (COLACE) 100 MG capsule Take 100 mg by mouth daily as needed (for constipation.).    Marland Kitchen ENTRESTO 24-26 MG Take 1 tablet by mouth 2 (two) times daily.   . fluticasone (FLONASE) 50 MCG/ACT nasal spray Place 2 sprays into both nostrils daily.   . fluticasone-salmeterol (ADVAIR HFA) 115-21 MCG/ACT inhaler USE 2 INHALATIONS ORALLY EVERY 12 HOURS (Patient taking differently: Inhale 2 puffs into the lungs every 12 (twelve) hours. )   . furosemide (LASIX) 20 MG tablet Take 2 tablets (40 mg total) by mouth daily. (Patient taking differently: Take 20 mg by mouth daily. )   . gabapentin (NEURONTIN) 100 MG capsule Take 300 mg by mouth in the morning, at noon,  and at bedtime.   Marland Kitchen guaiFENesin (MUCINEX) 600 MG 12 hr tablet Take 600 mg by mouth every evening.    Marland Kitchen ipratropium (ATROVENT) 0.03 % nasal spray Place 2 sprays into both nostrils 2 (two) times daily.    . Ipratropium-Albuterol (COMBIVENT RESPIMAT) 20-100 MCG/ACT AERS respimat Inhale 1 puff into the lungs every 4 (four) hours.  07/27/2019: Pt confirmed he takes every 4 hours  . levalbuterol (XOPENEX) 1.25 MG/3ML nebulizer solution Take 1.25 mg (3 mLs total) by nebulization every 4 (four) hours. (Patient taking differently: Take 3 mLs by nebulization every 4 (four) hours as needed for wheezing or shortness of breath. )   . loratadine (CLARITIN) 10 MG tablet Take 10 mg by mouth at bedtime.   . lovastatin (MEVACOR) 20 MG tablet Take 20 mg by mouth at bedtime.   . metoprolol succinate (TOPROL-XL) 25 MG 24 hr tablet Take 25 mg by mouth daily.   . mupirocin ointment (BACTROBAN) 2 % Apply 1 application topically daily.   . nitroGLYCERIN (NITROSTAT) 0.4 MG SL tablet Place 0.4 mg under the tongue every 5 (five) minutes as needed for chest pain.   Marland Kitchen oxyCODONE-acetaminophen (PERCOCET) 7.5-325 MG tablet Take 1-2 tablets by mouth every 6 (six) hours as needed for moderate pain.   . pantoprazole (PROTONIX) 40 MG tablet Take 40 mg by mouth daily before breakfast.    . Respiratory Therapy  Supplies (FLUTTER) DEVI Use as directed.   . roflumilast (DALIRESP) 500 MCG TABS tablet Take 500 mcg by mouth daily.   . sildenafil (VIAGRA) 25 MG tablet Take by mouth.   . SPIRIVA RESPIMAT 1.25 MCG/ACT AERS INHALE 2 PUFFS BY MOUTH INTO THE LUNGS DAILY   . tamsulosin (FLOMAX) 0.4 MG CAPS capsule Take 0.4 mg by mouth daily after supper.    . vitamin B-12 (CYANOCOBALAMIN) 1000 MCG tablet Take 1,000 mcg by mouth daily.   Marland Kitchen warfarin (COUMADIN) 5 MG tablet Take 5 mg by mouth daily.   . Wound Dressings (RESTORE WOUND CARE DRESSING) PADS Apply 1 each topically daily.     No facility-administered encounter medications on file as of  04/05/2020.    Functional Status:  In your present state of health, do you have any difficulty performing the following activities: 02/01/2020 12/20/2019  Hearing? N N  Vision? N N  Difficulty concentrating or making decisions? N N  Walking or climbing stairs? [name]  Comment Patient double amputee -  Dressing or bathing? N Y  Doing errands, shopping? Y N  Comment Wife assists -  Conservation officer, nature and eating ? N -  Using the Toilet? N -  In the past six months, have you accidently leaked urine? N -  Do you have problems with loss of bowel control? N -  Managing your Medications? N -  Managing your Finances? Y -  Comment wife assists -  Housekeeping or managing your Housekeeping? Y -  Comment wife assists -  Some recent data might be hidden    Fall/Depression Screening: Fall Risk  02/01/2020 12/20/2019 12/01/2019  Falls in the past year? 1 1 1   Number falls in past yr: 0 0 0  Injury with Fall? 0 0 0  Risk for fall due to : - History of fall(s) -  Follow up - Falls evaluation completed -   PHQ 2/9 Scores 02/01/2020 12/01/2019 10/30/2019  PHQ - 2 Score 0 0 0    Assessment: Patient continues to manage chronic conditions and benefits from disease management support.   Goals Addressed            This Visit's Progress   . Track and Manage My Symptoms   On track    Follow Up Date 06/23/20   - develop a rescue plan - follow rescue plan if symptoms flare-up - keep follow-up appointments    Why is this important?   Tracking your symptoms and other information about your health helps your doctor plan your care.  Write down the symptoms, the time of day, what you were doing and what medicine you are taking.  You will soon learn how to manage your symptoms.     Notes: Keep up the great work!!       Plan: RN CM will contact patient in January and patient agreeable.    [name], RN, MSN [place] Management Care Management Coordinator Direct Line [phone] Cell  [phone] Toll Free: [phone]  Fax: [phone]

## 2020-04-08 ENCOUNTER — Ambulatory Visit: Payer: Self-pay

## 2020-04-10 DIAGNOSIS — I482 Chronic atrial fibrillation, unspecified: Secondary | ICD-10-CM | POA: Diagnosis not present

## 2020-04-10 DIAGNOSIS — I5022 Chronic systolic (congestive) heart failure: Secondary | ICD-10-CM | POA: Diagnosis not present

## 2020-04-10 DIAGNOSIS — I10 Essential (primary) hypertension: Secondary | ICD-10-CM | POA: Diagnosis not present

## 2020-04-10 DIAGNOSIS — E782 Mixed hyperlipidemia: Secondary | ICD-10-CM | POA: Diagnosis not present

## 2020-04-12 DIAGNOSIS — Z89512 Acquired absence of left leg below knee: Secondary | ICD-10-CM | POA: Diagnosis not present

## 2020-04-16 ENCOUNTER — Ambulatory Visit (INDEPENDENT_AMBULATORY_CARE_PROVIDER_SITE_OTHER): Payer: Medicare HMO | Admitting: Vascular Surgery

## 2020-04-16 ENCOUNTER — Other Ambulatory Visit: Payer: Self-pay

## 2020-04-16 ENCOUNTER — Encounter (INDEPENDENT_AMBULATORY_CARE_PROVIDER_SITE_OTHER): Payer: Self-pay | Admitting: Vascular Surgery

## 2020-04-16 VITALS — BP 120/65 | HR 84 | Resp 16

## 2020-04-16 DIAGNOSIS — Z89611 Acquired absence of right leg above knee: Secondary | ICD-10-CM | POA: Diagnosis not present

## 2020-04-16 DIAGNOSIS — I70269 Atherosclerosis of native arteries of extremities with gangrene, unspecified extremity: Secondary | ICD-10-CM | POA: Diagnosis not present

## 2020-04-16 DIAGNOSIS — I480 Paroxysmal atrial fibrillation: Secondary | ICD-10-CM | POA: Diagnosis not present

## 2020-04-16 DIAGNOSIS — Z89512 Acquired absence of left leg below knee: Secondary | ICD-10-CM | POA: Diagnosis not present

## 2020-04-16 DIAGNOSIS — D6851 Activated protein C resistance: Secondary | ICD-10-CM

## 2020-04-16 DIAGNOSIS — I10 Essential (primary) hypertension: Secondary | ICD-10-CM

## 2020-04-16 NOTE — Assessment & Plan Note (Signed)
Healing well.  In the process of getting his prosthesis at this time.  No open wounds or ulcers.  Return in 6 months for recheck.

## 2020-04-16 NOTE — Progress Notes (Signed)
MRN : [id number]  Tyler [name] is a 64 y.o. (Jan 11, 1956) male who presents with chief complaint of  Chief Complaint  Patient presents with  . Follow-up    41month follow up  .  History of Present Illness: Patient returns today in follow up of his left BKA.  He had multiple attempts at limb salvage but ultimately needed a BKA to clear the infection in his left foot.  He is doing great with this.  He is out of pain.  His incision is well-healed.  He has a small silk suture sticking out on the medial aspect of the wound that we trimmed back today.  He is in the process of getting his prosthesis and it is being made by biotech at this time.  Current Outpatient Medications  Medication Sig Dispense Refill  . acetaminophen (TYLENOL) 500 MG tablet Take 1,000 mg by mouth daily as needed for moderate pain or headache.    . ALPRAZolam (XANAX) 1 MG tablet Take 1 mg by mouth 2 (two) times daily as needed for anxiety or sleep.     Marland Kitchen aspirin EC 81 MG tablet Take 1 tablet (81 mg total) by mouth daily. (Patient taking differently: Take 81 mg by mouth daily after supper. ) 150 tablet 2  . budesonide (PULMICORT) 0.25 MG/2ML nebulizer solution Inhale into the lungs.    . citalopram (CELEXA) 10 MG tablet Take 10 mg by mouth daily.    Marland Kitchen docusate sodium (COLACE) 100 MG capsule Take 100 mg by mouth daily as needed (for constipation.).     Marland Kitchen ENTRESTO 24-26 MG Take 1 tablet by mouth 2 (two) times daily. 60 tablet 0  . fluticasone (FLONASE) 50 MCG/ACT nasal spray Place 2 sprays into both nostrils daily.    . fluticasone-salmeterol (ADVAIR HFA) 115-21 MCG/ACT inhaler USE 2 INHALATIONS ORALLY EVERY 12 HOURS (Patient taking differently: Inhale 2 puffs into the lungs every 12 (twelve) hours. ) 36 Inhaler 5  . furosemide (LASIX) 20 MG tablet Take 2 tablets (40 mg total) by mouth daily. (Patient taking differently: Take 20 mg by mouth daily. ) 60 tablet 0  . gabapentin (NEURONTIN) 100 MG capsule Take 300 mg by mouth in  the morning, at noon, and at bedtime.    Marland Kitchen guaiFENesin (MUCINEX) 600 MG 12 hr tablet Take 600 mg by mouth every evening.     Marland Kitchen ipratropium (ATROVENT) 0.03 % nasal spray Place 2 sprays into both nostrils 2 (two) times daily.     . Ipratropium-Albuterol (COMBIVENT RESPIMAT) 20-100 MCG/ACT AERS respimat Inhale 1 puff into the lungs every 4 (four) hours.     Marland Kitchen JARDIANCE 10 MG TABS tablet     . levalbuterol (XOPENEX) 1.25 MG/3ML nebulizer solution Take 1.25 mg (3 mLs total) by nebulization every 4 (four) hours. (Patient taking differently: Take 3 mLs by nebulization every 4 (four) hours as needed for wheezing or shortness of breath. ) 72 mL 12  . loratadine (CLARITIN) 10 MG tablet Take 10 mg by mouth at bedtime.    . lovastatin (MEVACOR) 20 MG tablet Take 20 mg by mouth at bedtime.    . metoprolol succinate (TOPROL-XL) 25 MG 24 hr tablet Take 25 mg by mouth daily.    . mupirocin ointment (BACTROBAN) 2 % Apply 1 application topically daily. 22 g 0  . nitroGLYCERIN (NITROSTAT) 0.4 MG SL tablet Place 0.4 mg under the tongue every 5 (five) minutes as needed for chest pain.    Marland Kitchen oxyCODONE-acetaminophen (PERCOCET) 7.5-325  MG tablet Take 1-2 tablets by mouth every 6 (six) hours as needed for moderate pain. 50 tablet 0  . pantoprazole (PROTONIX) 40 MG tablet Take 40 mg by mouth daily before breakfast.     . Respiratory Therapy Supplies (FLUTTER) DEVI Use as directed. 1 each 0  . roflumilast (DALIRESP) 500 MCG TABS tablet Take 500 mcg by mouth daily.    Marland Kitchen SPIRIVA RESPIMAT 1.25 MCG/ACT AERS INHALE 2 PUFFS BY MOUTH INTO THE LUNGS DAILY 4 g 6  . tamsulosin (FLOMAX) 0.4 MG CAPS capsule Take 0.4 mg by mouth daily after supper.   11  . vitamin B-12 (CYANOCOBALAMIN) 1000 MCG tablet Take 1,000 mcg by mouth daily.    Marland Kitchen warfarin (COUMADIN) 5 MG tablet Take 5 mg by mouth daily.    . Wound Dressings (RESTORE WOUND CARE DRESSING) PADS Apply 1 each topically daily.     . sildenafil (VIAGRA) 25 MG tablet Take by mouth.      No current facility-administered medications for this visit.    Past Medical History:  Diagnosis Date  . AICD (automatic cardioverter/defibrillator) present   . Anxiety   . Arthritis   . Atherosclerosis of artery of extremity with ulceration ([place]) 09/2019   left foot s/p toe amp requiring debridement and futher toe amputations.  . Atrial fibrillation ([place])   . Cervical spinal stenosis    with neuropathy  . CHF (congestive heart failure) ([place])   . Constipation   . COPD (chronic obstructive pulmonary disease) ([place])   . Coronary artery disease   . Depression   . Dyspnea   . Dysrhythmia    atrial fibrillation  . Emphysema of lung ([place])   . Factor 5 Leiden mutation, heterozygous ([place])    on coumadin  . Factor V Leiden mutation ([place])   . GERD (gastroesophageal reflux disease)   . Hypertension   . Lung nodule seen on imaging study    being followed by dr. [name]. just watching it for last few years, without change  . Mitral valve insufficiency   . Moderate tricuspid insufficiency   . Myocardial infarction [facility]) 2004   stent placed, pacemaker implanted 2005  . Osteomyelitis ([place]) 10/2019   left foot  . Oxygen dependent    requires 2L nasal prong oxygen 24 hours a day  . Peripheral vascular disease ([place])   . Presence of permanent cardiac pacemaker 2005,2018  . PVD (peripheral vascular disease) ([place])   . Sleep apnea    waiting to have sleep study. Used bipap while hospitalized and said it was great for him.    Past Surgical History:  Procedure Laterality Date  . ABOVE KNEE LEG AMPUTATION Right    after below the knee amputation   . AMPUTATION Left 11/22/2019   Procedure: AMPUTATION BELOW KNEE;  Surgeon: Algernon Huxley, MD;  Location: [facility] ORS;  Service: Vascular;  Laterality: Left;  . AMPUTATION TOE Left 08/04/2019   Procedure: AMPUTATION TOE MPJ LEFT;  Surgeon: Samara Deist, DPM;  Location: [facility] ORS;  Service: Podiatry;  Laterality: Left;  . AMPUTATION TOE Left 10/06/2019    Procedure: AMPUTATION TOE MPJ T1,T2 LEFT;  Surgeon: Samara Deist, DPM;  Location: [facility] ORS;  Service: Podiatry;  Laterality: Left;  . APPLICATION OF WOUND VAC Left 01/12/2018   Procedure: APPLICATION OF WOUND VAC;  Surgeon: Algernon Huxley, MD;  Location: [facility] ORS;  Service: Vascular;  Laterality: Left;  . BELOW KNEE LEG AMPUTATION Right   . CATARACT EXTRACTION, BILATERAL Bilateral   .  COLONOSCOPY N/A 02/19/2020   Procedure: COLONOSCOPY;  Surgeon: Lesly Rubenstein, MD;  Location[facility] ENDOSCOPY;  Service: Endoscopy;  Laterality: N/A;  . CORONARY ANGIOPLASTY  2005   stent x 1 placed  . ENDARTERECTOMY FEMORAL Left 08/11/2017   Procedure: ENDARTERECTOMY FEMORAL;  Surgeon: Algernon Huxley, MD;  Location: [facility] ORS;  Service: Vascular;  Laterality: Left;  . ESOPHAGOGASTRODUODENOSCOPY N/A 02/19/2020   Procedure: ESOPHAGOGASTRODUODENOSCOPY (EGD);  Surgeon: Lesly Rubenstein, MD;  Location: [facility] ENDOSCOPY;  Service: Endoscopy;  Laterality: N/A;  . HEMATOMA EVACUATION Left 01/12/2018   Procedure: EVACUATION HEMATOMA ( DRAINING OF SEROMA);  Surgeon: Algernon Huxley, MD;  Location: [facility] ORS;  Service: Vascular;  Laterality: Left;  . IMPLANTABLE CARDIOVERTER DEFIBRILLATOR (ICD) GENERATOR CHANGE Left 02/10/2017   Procedure: ICD GENERATOR CHANGE;  Surgeon: Isaias Cowman, MD;  Location: [facility] ORS;  Service: Cardiovascular;  Laterality: Left;  . INSERT / REPLACE / REMOVE PACEMAKER  4627,0350, 2018  . LOWER EXTREMITY ANGIOGRAPHY Left 06/07/2017   Procedure: LOWER EXTREMITY ANGIOGRAPHY;  Surgeon: Algernon Huxley, MD;  Location: [place] CV LAB;  Service: Cardiovascular;  Laterality: Left;  . LOWER EXTREMITY ANGIOGRAPHY Left 10/05/2019   Procedure: LOWER EXTREMITY ANGIOGRAPHY;  Surgeon: Algernon Huxley, MD;  Location: [place] [facility] LAB;  Service: Cardiovascular;  Laterality: Left;  . LOWER EXTREMITY INTERVENTION  06/07/2017   Procedure: LOWER EXTREMITY INTERVENTION;  Surgeon: Algernon Huxley, MD;  Location: [place] CV LAB;  Service: Cardiovascular;;  . PERIPHERAL VASCULAR CATHETERIZATION Left 12/09/2015   Procedure: Lower Extremity Angiography;  Surgeon: Algernon Huxley, MD;  Location: [place] CV LAB;  Service: Cardiovascular;  Laterality: Left;  . PERIPHERAL VASCULAR CATHETERIZATION  12/09/2015   Procedure: Lower Extremity Intervention;  Surgeon: Algernon Huxley, MD;  Location: [place] [facility] LAB;  Service: Cardiovascular;;  . TONSILLECTOMY    . TRANSMETATARSAL AMPUTATION Left 10/20/2019   Procedure: TRANSMETATARSAL AMPUTATION;  Surgeon: Sharlotte Alamo, DPM;  Location: [facility] ORS;  Service: Podiatry;  Laterality: Left;     Social History   Tobacco Use  . Smoking status: Former Smoker    Packs/day: 1.00    Years: 42.00    Pack years: 42.00    Types: Cigarettes    Quit date: 09/23/2013    Years since quitting: 6.5  . Smokeless tobacco: Never Used  Vaping Use  . Vaping Use: Never used  Substance Use Topics  . Alcohol use: No    Comment: quit 6 years ago  . Drug use: No     Family History  Problem Relation Age of Onset  . Cancer Mother   . Cancer Father   . Heart disease Father      Allergies  Allergen Reactions  . Apixaban Rash  . Rivaroxaban Rash     REVIEW OF SYSTEMS (Negative unless checked)  Constitutional: [] Weight loss  [] Fever  [] Chills Cardiac: [] Chest pain   [] Chest pressure   [] Palpitations   [] Shortness of breath when laying flat   [] Shortness of breath at rest   [] Shortness of breath with exertion. Vascular:  [] Pain in legs with walking   [] Pain in legs at rest   [] Pain in legs when laying flat   [] Claudication   [] Pain in feet when walking  [] Pain in feet at rest  [] Pain in feet when laying flat   [] History of DVT   [] Phlebitis   [] Swelling in legs   [] Varicose veins   [] Non-healing ulcers Pulmonary:   [x] Uses home oxygen   [] Productive cough   []   Hemoptysis   [] Wheeze  [x] COPD   [] Asthma Neurologic:  [] Dizziness  [] Blackouts   [] Seizures   [] History of stroke    [] History of TIA  [] Aphasia   [] Temporary blindness   [] Dysphagia   [] Weakness or numbness in arms   [] Weakness or numbness in legs Musculoskeletal:  [x] Arthritis   [] Joint swelling   [] Joint pain   [] Low back pain Hematologic:  [] Easy bruising  [] Easy bleeding   [] Hypercoagulable state   [] Anemic   Gastrointestinal:  [] Blood in stool   [] Vomiting blood  [] Gastroesophageal reflux/heartburn   [] Abdominal pain Genitourinary:  [x] Chronic kidney disease   [] Difficult urination  [] Frequent urination  [] Burning with urination   [] Hematuria Skin:  [] Rashes   [] Ulcers   [] Wounds Psychological:  [] History of anxiety   []  History of major depression.  Physical Examination  BP 120/65 (BP Location: Left Arm)   Pulse 84   Resp 16  Gen:  WD/WN, NAD Head: [place]/AT, No temporalis wasting. Ear/Nose/Throat: Hearing grossly intact, nares w/o erythema or drainage Eyes: Conjunctiva clear. Sclera non-icteric Neck: Supple.  Trachea midline Pulmonary:  Good air movement, no use of accessory muscles on oxygen.  Cardiac: irregular Vascular:  Vessel Right Left  Radial Palpable Palpable               Musculoskeletal: M/S 5/5 throughout.  In a wheelchair.  Right AKA present.  Left BKA well-healed without wounds or openings.  The small suture sticking out today was trimmed back to the skin edge.  No erythema or drainage Neurologic: Sensation grossly intact in extremities.  Symmetrical.  Speech is fluent.  Psychiatric: Judgment intact, Mood & affect appropriate for pt's clinical situation. Dermatologic: No rashes or ulcers noted.  No cellulitis or open wounds.       Labs Recent Results (from the past 2160 hour(s))  SARS CORONAVIRUS 2 (TAT 6-24 HRS) Nasopharyngeal Nasopharyngeal Swab     Status: None   Collection Time: 02/15/20 11:37 AM   Specimen: Nasopharyngeal Swab  Result Value Ref Range   SARS Coronavirus 2 NEGATIVE NEGATIVE    Comment: (NOTE) SARS-CoV-2 target nucleic acids are NOT DETECTED.  The  SARS-CoV-2 RNA is generally detectable in upper and lower respiratory specimens during the acute phase of infection. Negative results do not preclude SARS-CoV-2 infection, do not rule out co-infections with other pathogens, and should not be used as the sole basis for treatment or other patient management decisions. Negative results must be combined with clinical observations, patient history, and epidemiological information. The expected result is Negative.  Fact Sheet for Patients: SugarRoll.be  Fact Sheet for Healthcare Providers: https://www.woods-mathews.com/  This test is not yet approved or cleared by the [place] FDA and  has been authorized for detection and/or diagnosis of SARS-CoV-2 by FDA under an Emergency Use Authorization (EUA). This EUA will remain  in effect (meaning this test can be used) for the duration of the COVID-19 declaration under Se ction 564(b)(1) of the Act, 21 U.S.C. section 360bbb-3(b)(1), unless the authorization is terminated or revoked sooner.  Performed at [facility], [place] [address] [place], [place] [address]   KOH prep     Status: None   Collection Time: 02/19/20 10:50 AM   Specimen: Esophagus  Result Value Ref Range   Specimen Description ESOPHAGUS    Special Requests NONE    KOH Prep      BUDDING YEAST SEEN YEAST WITH PSEUDOHYPHAE Performed at [facility], [address] [place], [place] [address]  Report Status 02/19/2020 FINAL   Surgical pathology     Status: None   Collection Time: 02/19/20 11:38 AM  Result Value Ref Range   SURGICAL PATHOLOGY      SURGICAL PATHOLOGY CASE: [id number] PATIENT: Sherril Croon Surgical Pathology Report     Specimen Submitted: A. Colon polyp, descending; hot snare  Clinical History: GERD, esophagitis, dysphagia, hx of colon polyps. Hiatal hernia, diverticulosis, colon polyp.    DIAGNOSIS: A. COLON POLYP, DESCENDING;  HOT SNARE: - MULTIPLE FRAGMENTS OF TUBULOVILLOUS ADENOMA. - NEGATIVE FOR HIGH-GRADE DYSPLASIA AND MALIGNANCY.  Comment: Due to specimen fragmentation, determination of dysplasia at the polyp base is difficult. However, one fragment does show adenomatous epithelium involved by cautery artifact, suggesting dysplasia could be present at the base of resection. Clinical and endoscopic correlation is recommended.  GROSS DESCRIPTION: A. Labeled: Descending colon polyp hot snare Received: In formalin Tissue fragment(s): 3 Size: From 0.2 x 0.2 x 0.1 cm up to 1.6 x 1.2 x 0.3 cm Description: Fragments of pink-red polypoid tissue which cannot be oriented admixed  with fecal matter.  The largest piece is trisected Entirely submitted in 1 cassette.   Final Diagnosis performed by Allena Napoleon, MD.   Electronically signed 02/20/2020 9:16:34AM The electronic signature indicates that the named Attending Pathologist has evaluated the specimen Technical component performed at [facility], [address], [place], [place] [address] Lab: [phone] Dir: Rush Farmer, MD, MMM  Professional component performed at [facility], [facility], [place], [place], [place] [address] Lab: [phone] Dir: Dellia Nims. Rubinas, MD   Basic metabolic panel     Status: Abnormal   Collection Time: 03/15/20  4:31 PM  Result Value Ref Range   Sodium 133 (L) 135 - 145 mmol/L   Potassium 3.9 3.5 - 5.1 mmol/L   Chloride 95 (L) 98 - 111 mmol/L   CO2 29 22 - 32 mmol/L   Glucose, Bld 100 (H) 70 - 99 mg/dL    Comment: Glucose reference range applies only to samples taken after fasting for at least 8 hours.   BUN 8 8 - 23 mg/dL   Creatinine, Ser 0.71 0.61 - 1.24 mg/dL   Calcium 9.2 8.9 - 10.3 mg/dL   GFR, Estimated >60 >60 mL/min    Comment: (NOTE) Calculated using the CKD-EPI Creatinine Equation (2021)    Anion gap 9 5 - 15    Comment: Performed at [facility], [address]  [place], [place] [address]  CBC     Status: Abnormal   Collection Time: 03/15/20  4:31 PM  Result Value Ref Range   WBC 7.8 4.0 - 10.5 K/uL   RBC 4.39 4.22 - 5.81 MIL/uL   Hemoglobin 12.1 (L) 13.0 - 17.0 g/dL   HCT 35.8 (L) 39 - 52 %   MCV 81.5 80.0 - 100.0 fL   MCH 27.6 26.0 - 34.0 pg   MCHC 33.8 30.0 - 36.0 g/dL   RDW 14.6 11.5 - 15.5 %   Platelets 239 150 - 400 K/uL   nRBC 0.0 0.0 - 0.2 %    Comment: Performed at [facility], [address] [place], [place] [address]  Protime-INR- (order if Patient is taking Coumadin / Warfarin)     Status: Abnormal   Collection Time: 03/15/20  4:31 PM  Result Value Ref Range   Prothrombin Time 76.0 (H) 11.4 - 15.2 seconds   INR 9.8 (HH) 0.8 - 1.2    Comment: CRITICAL RESULT CALLED TO, READ BACK BY  AND VERIFIED WITH: JADEKA MANGRUM RN AT 0071 ON 03/15/20 SNG (NOTE) INR goal varies based on device and disease states. Performed at [facility], [address], [place], [place] [address]     Radiology No results found.  Assessment/Plan Hx of AKA (above knee amputation), right (HCC) Well healed  Essential hypertension, benign blood pressure control important in reducing the progression of atherosclerotic disease. On appropriate oral medications.  Chronic systolic CHF (congestive heart failure) (HCC) His most recent echocardiogram showed a reduction in his ejection fraction down to about 30%. Hehe went for what was supposed to be a dual chamber pacemaker a few weeks ago but was found to already have that. They did do some "tweaking" of the pacer and this may improve his function somewhat.  COPD (chronic obstructive pulmonary disease) (HCC) Severe, requiring oxygen  Atherosclerosis of native arteries of the extremities with ulceration ([place]) Now status post left BKA.  It is healing well.  Recheck in 3 months.  Hx of BKA, left ([place]) Healing well.  In the process of getting his prosthesis at this time.  No open wounds  or ulcers.  Return in 6 months for recheck.    Leotis Pain, MD  04/16/2020 2:37 PM    This note was created with Dragon medical transcription system.  Any errors from dictation are purely unintentional

## 2020-04-23 DIAGNOSIS — J441 Chronic obstructive pulmonary disease with (acute) exacerbation: Secondary | ICD-10-CM | POA: Diagnosis not present

## 2020-04-23 DIAGNOSIS — J9610 Chronic respiratory failure, unspecified whether with hypoxia or hypercapnia: Secondary | ICD-10-CM | POA: Diagnosis not present

## 2020-04-25 DIAGNOSIS — M5000 Cervical disc disorder with myelopathy, unspecified cervical region: Secondary | ICD-10-CM | POA: Diagnosis not present

## 2020-04-25 DIAGNOSIS — J441 Chronic obstructive pulmonary disease with (acute) exacerbation: Secondary | ICD-10-CM | POA: Diagnosis not present

## 2020-05-01 DIAGNOSIS — M542 Cervicalgia: Secondary | ICD-10-CM | POA: Diagnosis not present

## 2020-05-01 DIAGNOSIS — I482 Chronic atrial fibrillation, unspecified: Secondary | ICD-10-CM | POA: Diagnosis not present

## 2020-05-01 DIAGNOSIS — M5481 Occipital neuralgia: Secondary | ICD-10-CM | POA: Diagnosis not present

## 2020-05-06 DIAGNOSIS — I5022 Chronic systolic (congestive) heart failure: Secondary | ICD-10-CM | POA: Diagnosis not present

## 2020-05-06 DIAGNOSIS — I482 Chronic atrial fibrillation, unspecified: Secondary | ICD-10-CM | POA: Diagnosis not present

## 2020-05-09 DIAGNOSIS — I482 Chronic atrial fibrillation, unspecified: Secondary | ICD-10-CM | POA: Diagnosis not present

## 2020-05-09 DIAGNOSIS — I2510 Atherosclerotic heart disease of native coronary artery without angina pectoris: Secondary | ICD-10-CM | POA: Diagnosis not present

## 2020-05-09 DIAGNOSIS — J4540 Moderate persistent asthma, uncomplicated: Secondary | ICD-10-CM | POA: Diagnosis not present

## 2020-05-09 DIAGNOSIS — I70219 Atherosclerosis of native arteries of extremities with intermittent claudication, unspecified extremity: Secondary | ICD-10-CM | POA: Diagnosis not present

## 2020-05-09 DIAGNOSIS — I5022 Chronic systolic (congestive) heart failure: Secondary | ICD-10-CM | POA: Diagnosis not present

## 2020-05-09 DIAGNOSIS — Z95810 Presence of automatic (implantable) cardiac defibrillator: Secondary | ICD-10-CM | POA: Diagnosis not present

## 2020-05-09 DIAGNOSIS — I70229 Atherosclerosis of native arteries of extremities with rest pain, unspecified extremity: Secondary | ICD-10-CM | POA: Diagnosis not present

## 2020-05-23 DIAGNOSIS — J441 Chronic obstructive pulmonary disease with (acute) exacerbation: Secondary | ICD-10-CM | POA: Diagnosis not present

## 2020-05-23 DIAGNOSIS — J9610 Chronic respiratory failure, unspecified whether with hypoxia or hypercapnia: Secondary | ICD-10-CM | POA: Diagnosis not present

## 2020-05-24 DIAGNOSIS — J441 Chronic obstructive pulmonary disease with (acute) exacerbation: Secondary | ICD-10-CM | POA: Diagnosis not present

## 2020-05-26 DIAGNOSIS — J441 Chronic obstructive pulmonary disease with (acute) exacerbation: Secondary | ICD-10-CM | POA: Diagnosis not present

## 2020-05-26 DIAGNOSIS — M5000 Cervical disc disorder with myelopathy, unspecified cervical region: Secondary | ICD-10-CM | POA: Diagnosis not present

## 2020-06-03 DIAGNOSIS — I482 Chronic atrial fibrillation, unspecified: Secondary | ICD-10-CM | POA: Diagnosis not present

## 2020-06-04 DIAGNOSIS — I5022 Chronic systolic (congestive) heart failure: Secondary | ICD-10-CM | POA: Diagnosis not present

## 2020-06-07 ENCOUNTER — Other Ambulatory Visit: Payer: Self-pay

## 2020-06-07 NOTE — Patient Outreach (Signed)
[facility]) Care Management  06/07/2020  Tyler [name] 11-May-1956 [id number]   Telephone call to patient for disease management follow up.   No answer.  HIPAA compliant voice message left.    Plan: If no return call, RN CM will attempt patient again in March.  [name], RN, MSN [place] [facility] Care Management Coordinator Direct Line [phone] Cell [phone] Toll Free: [phone]  Fax: [phone]

## 2020-06-20 ENCOUNTER — Other Ambulatory Visit: Payer: Medicare HMO | Admitting: Primary Care

## 2020-06-20 ENCOUNTER — Other Ambulatory Visit: Payer: Self-pay

## 2020-06-23 DIAGNOSIS — J441 Chronic obstructive pulmonary disease with (acute) exacerbation: Secondary | ICD-10-CM | POA: Diagnosis not present

## 2020-06-23 DIAGNOSIS — J9610 Chronic respiratory failure, unspecified whether with hypoxia or hypercapnia: Secondary | ICD-10-CM | POA: Diagnosis not present

## 2020-06-24 DIAGNOSIS — J4540 Moderate persistent asthma, uncomplicated: Secondary | ICD-10-CM | POA: Diagnosis not present

## 2020-06-24 DIAGNOSIS — J441 Chronic obstructive pulmonary disease with (acute) exacerbation: Secondary | ICD-10-CM | POA: Diagnosis not present

## 2020-06-25 DIAGNOSIS — J431 Panlobular emphysema: Secondary | ICD-10-CM | POA: Diagnosis not present

## 2020-06-25 DIAGNOSIS — Z89511 Acquired absence of right leg below knee: Secondary | ICD-10-CM | POA: Diagnosis not present

## 2020-06-25 DIAGNOSIS — I70229 Atherosclerosis of native arteries of extremities with rest pain, unspecified extremity: Secondary | ICD-10-CM | POA: Diagnosis not present

## 2020-06-25 DIAGNOSIS — F320 Major depressive disorder, single episode, mild: Secondary | ICD-10-CM | POA: Diagnosis not present

## 2020-06-25 DIAGNOSIS — M5000 Cervical disc disorder with myelopathy, unspecified cervical region: Secondary | ICD-10-CM | POA: Diagnosis not present

## 2020-06-25 DIAGNOSIS — J9611 Chronic respiratory failure with hypoxia: Secondary | ICD-10-CM | POA: Diagnosis not present

## 2020-06-25 DIAGNOSIS — I482 Chronic atrial fibrillation, unspecified: Secondary | ICD-10-CM | POA: Diagnosis not present

## 2020-06-25 DIAGNOSIS — Z0000 Encounter for general adult medical examination without abnormal findings: Secondary | ICD-10-CM | POA: Diagnosis not present

## 2020-06-25 DIAGNOSIS — D6851 Activated protein C resistance: Secondary | ICD-10-CM | POA: Diagnosis not present

## 2020-06-26 DIAGNOSIS — M5000 Cervical disc disorder with myelopathy, unspecified cervical region: Secondary | ICD-10-CM | POA: Diagnosis not present

## 2020-06-26 DIAGNOSIS — J441 Chronic obstructive pulmonary disease with (acute) exacerbation: Secondary | ICD-10-CM | POA: Diagnosis not present

## 2020-07-02 ENCOUNTER — Other Ambulatory Visit: Payer: Medicare HMO | Admitting: Primary Care

## 2020-07-02 ENCOUNTER — Other Ambulatory Visit: Payer: Self-pay

## 2020-07-02 DIAGNOSIS — M5000 Cervical disc disorder with myelopathy, unspecified cervical region: Secondary | ICD-10-CM | POA: Diagnosis not present

## 2020-07-02 DIAGNOSIS — Z89611 Acquired absence of right leg above knee: Secondary | ICD-10-CM | POA: Diagnosis not present

## 2020-07-02 DIAGNOSIS — Z89512 Acquired absence of left leg below knee: Secondary | ICD-10-CM | POA: Diagnosis not present

## 2020-07-02 DIAGNOSIS — Z515 Encounter for palliative care: Secondary | ICD-10-CM

## 2020-07-02 DIAGNOSIS — F418 Other specified anxiety disorders: Secondary | ICD-10-CM | POA: Diagnosis not present

## 2020-07-02 DIAGNOSIS — J449 Chronic obstructive pulmonary disease, unspecified: Secondary | ICD-10-CM | POA: Diagnosis not present

## 2020-07-02 NOTE — Progress Notes (Signed)
[facility] Palliative Care Consult Note Telephone: [phone]  Fax: [phone]   TELEHEALTH VISIT STATEMENT Due to the COVID-19 crisis, this visit was done via telemedicine from my office. It was initiated and consented to by this patient and/or family.    Date of encounter: 07/02/20 PATIENT NAME: Tyler [name] [address] [place] [address] [phone] (home)  DOB: 1955/10/05 MRN: [id number]  PRIMARY CARE PROVIDER:    [name], MD,  [place] [address] [phone]  REFERRING PROVIDER:   [name], MD [place],  [place] [address] [phone]  RESPONSIBLE PARTY:   Extended Emergency Contact Information Primary Emergency Contact: Adger, Cantera Address: [place], [place] [address] Johnnette Litter of [place] Phone: [phone] Mobile Phone: [phone] Relation: Spouse Secondary Emergency Contact: Thomasenia Bottoms Mobile Phone: [phone] Relation: Sister    Palliative Care was asked to follow this patient by consultation request of [name], MD to help address advance care planning and goals of care. This is a follow up  visit.   ASSESSMENT AND RECOMMENDATIONS:   1. Advance Care Planning/Goals of Care: Goals include to maximize quality of life and symptom management.  Reviewed Advance care plans, and no changes in MOST form. This form is on file and in the home.  2. Symptom Management:   Mood: Endorses improvement of well being since BKA,   he feels due to chronic infection in that limb. Had bka in 7/21. Hospital stays have lessened and he states he feels better than he has in a few years.   Mobility : New prosthesis, doing well with new leg but has not been trained to use it. Doing some exercises to strengthen. He is likely deconditioned from immobility in addition to limited by organ disease.  Discussed out patient PT, or home health. He  states [place] had said they'd return to work with the prosthesis, so he will reach out to them.  Breathing : Has trilogy every night. States he feels better with using it. New  To Dupixent and has tolerated it well.  Is on grant program It is addressing possible asthma component of COPD.   Pain: Chronic, managed with fair control. Recently had a nerve block which helps some but does not last long.  3. Follow up Palliative Care Visit: Palliative care will continue to follow for goals of care clarification and symptom management. Return 4-6 months or prn.  4. Family /Caregiver/Community Supports: [place]. Followed by several specialties.  5. Cognitive / Functional decline: A and O x 3, needs help with adls and iadls due to conditioning and mobility impairments.  I spent  25 minutes providing this consultation,  from 1400 to 1425. More than 50% of the time in this consultation was spent in counseling and care coordination.  CODE STATUS:FULL  PPS: 40%  HOSPICE ELIGIBILITY/DIAGNOSIS: no  Subjective:  CHIEF COMPLAINT: dyspnea  HISTORY OF PRESENT ILLNESS:  Tyler [name] is a 65 y.o. year old male  with h/o copd, chf, vascular disease, L BKA, deconditioning.  He reports dyspnea on exertion which is present most of the time. Has oxygen and trilogy for hs. Feels some is due to deconditioning of not walking as he has been waiting for a prosthesis. New to dupixent for breathing difficulty.   We are asked to consult  around advance care planning and complex medical decision making.    Review and summarization of old Epic records shows or history from other than patient. Review or lab tests, radiology,  or medicine Recent labs and vascular studies  History obtained from review of EMR, discussion with primary team, and  interview with family, caregiver  and/or Mr. Chartrand. Records reviewed and summarized above.   CURRENT PROBLEM LIST:  Patient  Active Problem List   Diagnosis Date Noted  . Hx of BKA, left ([place]) 01/23/2020  . 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 Chronic respiratory failure with hypoxia ([place]) 11/02/2019  . BPH (benign prostatic hyperplasia) 10/25/2019  . Depression with anxiety 10/25/2019  . Osteomyelitis of left leg ([place]) 10/18/2019  . Leg swelling 03/26/2019  . Palliative care by specialist 02/16/2019  . Atherosclerosis of native arteries of the extremities with ulceration ([place]) 01/31/2019  . Postoperative seroma of subcutaneous tissue after non-dermatologic procedure 02/04/2018  . Current mild episode of major depressive disorder without prior episode ([place]) 12/14/2017  . Atherosclerosis of artery of extremity with rest pain ([place]) 08/11/2017  . COPD (chronic obstructive pulmonary disease) ([place]) 06/22/2017  . Cervical disc disease with myelopathy 05/28/2017  . COPD with acute exacerbation ([place]) 10/13/2016  . Acute on chronic respiratory failure with hypoxia ([place]) 10/13/2016  . Acute on chronic systolic (congestive) heart failure ([place]) 10/13/2016  . AF (paroxysmal atrial fibrillation) ([place]) 10/13/2016  . Hx of AKA (above knee amputation), right ([place]) 05/15/2016  . Essential hypertension, benign 05/15/2016  . PVD (peripheral vascular disease) ([place]) 05/15/2016  . Atherosclerotic peripheral vascular disease with intermittent claudication ([place]) 08/01/2014  . Moderate tricuspid insufficiency 08/01/2014  . Mixed hyperlipidemia 07/23/2014  . AICD (automatic cardioverter/defibrillator) present 06/12/2011  . Factor V Leiden mutation ([place]) 06/12/2011  . Coronary artery disease 06/12/2011   PAST MEDICAL HISTORY:  Active Ambulatory Problems    Diagnosis Date Noted  . Hx of AKA (above knee amputation), right ([place]) 05/15/2016  . Essential hypertension, benign 05/15/2016  . PVD (peripheral vascular disease) ([place]) 05/15/2016  . COPD with acute exacerbation ([place]) 10/13/2016  . Acute on  chronic respiratory failure with hypoxia ([place]) 10/13/2016  . Acute on chronic systolic (congestive) heart failure ([place]) 10/13/2016  . AF (paroxysmal atrial fibrillation) ([place]) 10/13/2016  . AICD (automatic cardioverter/defibrillator) present 06/12/2011  . Atherosclerotic peripheral vascular disease with intermittent claudication ([place]) 08/01/2014  . Cervical disc disease with myelopathy 05/28/2017  . Factor V Leiden mutation ([place]) 06/12/2011  . Mixed hyperlipidemia 07/23/2014  . Moderate tricuspid insufficiency 08/01/2014  . COPD (chronic obstructive pulmonary disease) ([place]) 06/22/2017  . Atherosclerosis of artery of extremity with rest pain ([place]) 08/11/2017  . Postoperative seroma of subcutaneous tissue after non-dermatologic procedure 02/04/2018  . Current mild episode of major depressive disorder without prior episode ([place]) 12/14/2017  . Atherosclerosis of native arteries of the extremities with ulceration ([place]) 01/31/2019  . Palliative care by specialist 02/16/2019  . Leg swelling 03/26/2019  . Coronary artery disease 06/12/2011  . Osteomyelitis of left leg ([place]) 10/18/2019  . BPH (benign prostatic hyperplasia) 10/25/2019  . Depression with anxiety 10/25/2019  . 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 Chronic respiratory failure with hypoxia ([place]) 11/02/2019  . Hx of BKA, left ([place]) 01/23/2020   Resolved Ambulatory Problems    Diagnosis Date Noted  . No Resolved Ambulatory Problems   Past Medical History:  Diagnosis Date  . Anxiety   . Arthritis   .  Atherosclerosis of artery of extremity with ulceration ([place]) 09/2019  . Atrial fibrillation ([place])   . Cervical spinal stenosis   . CHF (congestive heart failure) ([place])   . Constipation   . Depression   . Dyspnea   . Dysrhythmia   . Emphysema of lung ([place])   . Factor 5 Leiden mutation, heterozygous ([place])   . GERD (gastroesophageal reflux disease)   . Hypertension   . Lung nodule seen on  imaging study   . Mitral valve insufficiency   . Myocardial infarction ([place]) 2004  . Osteomyelitis ([place]) 10/2019  . Oxygen dependent   . Peripheral vascular disease ([place])   . Presence of permanent cardiac pacemaker 2005,2018  . Sleep apnea    SOCIAL HX:  Social History   Tobacco Use  . Smoking status: Former Smoker    Packs/day: 1.00    Years: 42.00    Pack years: 42.00    Types: Cigarettes    Quit date: 09/23/2013    Years since quitting: 6.7  . Smokeless tobacco: Never Used  Substance Use Topics  . Alcohol use: No    Comment: quit 6 years ago   FAMILY HX:  Family History  Problem Relation Age of Onset  . Cancer Mother   . Cancer Father   . Heart disease Father       ALLERGIES:  Allergies  Allergen Reactions  . Apixaban Rash  . Rivaroxaban Rash     PERTINENT MEDICATIONS:  Outpatient Encounter Medications as of 07/02/2020  Medication Sig  . acetaminophen (TYLENOL) 500 MG tablet Take 1,000 mg by mouth daily as needed for moderate pain or headache.  . ALPRAZolam (XANAX) 1 MG tablet Take 1 mg by mouth 2 (two) times daily as needed for anxiety or sleep.   Marland Kitchen aspirin EC 81 MG tablet Take 1 tablet (81 mg total) by mouth daily. (Patient taking differently: Take 81 mg by mouth daily after supper. )  . budesonide (PULMICORT) 0.25 MG/2ML nebulizer solution Inhale into the lungs.  . citalopram (CELEXA) 10 MG tablet Take 10 mg by mouth daily.  Marland Kitchen docusate sodium (COLACE) 100 MG capsule Take 100 mg by mouth daily as needed (for constipation.).   Marland Kitchen ENTRESTO 24-26 MG Take 1 tablet by mouth 2 (two) times daily.  . fluticasone (FLONASE) 50 MCG/ACT nasal spray Place 2 sprays into both nostrils daily.  . fluticasone-salmeterol (ADVAIR HFA) 115-21 MCG/ACT inhaler USE 2 INHALATIONS ORALLY EVERY 12 HOURS (Patient taking differently: Inhale 2 puffs into the lungs every 12 (twelve) hours. )  . furosemide (LASIX) 20 MG tablet Take 2 tablets (40 mg total) by mouth daily. (Patient taking  differently: Take 20 mg by mouth daily. )  . gabapentin (NEURONTIN) 100 MG capsule Take 300 mg by mouth in the morning, at noon, and at bedtime.  Marland Kitchen guaiFENesin (MUCINEX) 600 MG 12 hr tablet Take 600 mg by mouth every evening.   Marland Kitchen ipratropium (ATROVENT) 0.03 % nasal spray Place 2 sprays into both nostrils 2 (two) times daily.   . Ipratropium-Albuterol (COMBIVENT RESPIMAT) 20-100 MCG/ACT AERS respimat Inhale 1 puff into the lungs every 4 (four) hours.   Marland Kitchen JARDIANCE 10 MG TABS tablet   . levalbuterol (XOPENEX) 1.25 MG/3ML nebulizer solution Take 1.25 mg (3 mLs total) by nebulization every 4 (four) hours. (Patient taking differently: Take 3 mLs by nebulization every 4 (four) hours as needed for wheezing or shortness of breath. )  . loratadine (CLARITIN) 10 MG tablet Take 10 mg by mouth at  bedtime.  . lovastatin (MEVACOR) 20 MG tablet Take 20 mg by mouth at bedtime.  . metoprolol succinate (TOPROL-XL) 25 MG 24 hr tablet Take 25 mg by mouth daily.  . mupirocin ointment (BACTROBAN) 2 % Apply 1 application topically daily.  . nitroGLYCERIN (NITROSTAT) 0.4 MG SL tablet Place 0.4 mg under the tongue every 5 (five) minutes as needed for chest pain.  Marland Kitchen oxyCODONE-acetaminophen (PERCOCET) 7.5-325 MG tablet Take 1-2 tablets by mouth every 6 (six) hours as needed for moderate pain.  . pantoprazole (PROTONIX) 40 MG tablet Take 40 mg by mouth daily before breakfast.   . Respiratory Therapy Supplies (FLUTTER) DEVI Use as directed.  . roflumilast (DALIRESP) 500 MCG TABS tablet Take 500 mcg by mouth daily.  . sildenafil (VIAGRA) 25 MG tablet Take by mouth.  . SPIRIVA RESPIMAT 1.25 MCG/ACT AERS INHALE 2 PUFFS BY MOUTH INTO THE LUNGS DAILY  . tamsulosin (FLOMAX) 0.4 MG CAPS capsule Take 0.4 mg by mouth daily after supper.   . vitamin B-12 (CYANOCOBALAMIN) 1000 MCG tablet Take 1,000 mcg by mouth daily.  Marland Kitchen warfarin (COUMADIN) 5 MG tablet Take 5 mg by mouth daily.  . Wound Dressings (RESTORE WOUND CARE DRESSING) PADS  Apply 1 each topically daily.    No facility-administered encounter medications on file as of 07/02/2020.    Objective: ROS  General: NAD, Current and past weights: 146 lbs EYES: denies vision changes, wears glasses ENMT: denies dysphagia Cardiovascular: denies chest pain Pulmonary: endorses cough, endorses increased SOB, DOE, + trilogy use Abdomen: endorses good appetite, denies  constipation, endorses continence of bowel GU: denies dysuria, endorses continence of urine MSK:  endorses ROM limitations, no falls reported R AKA and L BKA Skin: denies rashes or wounds Neurological: endorses increased weakness, endorse chronic pain, denies insomnia Psych: Endorses positive mood Heme/lymph/immuno: denies bruises, abnormal bleeding  Physical Exam:  . deferred  Thank you for the opportunity to participate in the care of Mr. Sheppard.  The palliative care team will continue to follow. Please call our office at [phone] if we can be of additional assistance.  Jason Coop, NP , DNP, MPH, AGPCNP-BC, [facility]

## 2020-07-03 DIAGNOSIS — D649 Anemia, unspecified: Secondary | ICD-10-CM | POA: Diagnosis not present

## 2020-07-03 DIAGNOSIS — R195 Other fecal abnormalities: Secondary | ICD-10-CM | POA: Diagnosis not present

## 2020-07-03 DIAGNOSIS — I482 Chronic atrial fibrillation, unspecified: Secondary | ICD-10-CM | POA: Diagnosis not present

## 2020-07-03 DIAGNOSIS — D126 Benign neoplasm of colon, unspecified: Secondary | ICD-10-CM | POA: Diagnosis not present

## 2020-07-17 DIAGNOSIS — J449 Chronic obstructive pulmonary disease, unspecified: Secondary | ICD-10-CM | POA: Diagnosis not present

## 2020-07-18 ENCOUNTER — Other Ambulatory Visit: Payer: Self-pay | Admitting: Pulmonary Disease

## 2020-07-18 DIAGNOSIS — J449 Chronic obstructive pulmonary disease, unspecified: Secondary | ICD-10-CM

## 2020-07-19 ENCOUNTER — Ambulatory Visit: Payer: Medicare HMO | Admitting: Family

## 2020-07-22 DIAGNOSIS — J9610 Chronic respiratory failure, unspecified whether with hypoxia or hypercapnia: Secondary | ICD-10-CM | POA: Diagnosis not present

## 2020-07-22 DIAGNOSIS — J441 Chronic obstructive pulmonary disease with (acute) exacerbation: Secondary | ICD-10-CM | POA: Diagnosis not present

## 2020-07-24 DIAGNOSIS — J441 Chronic obstructive pulmonary disease with (acute) exacerbation: Secondary | ICD-10-CM | POA: Diagnosis not present

## 2020-07-24 DIAGNOSIS — M5000 Cervical disc disorder with myelopathy, unspecified cervical region: Secondary | ICD-10-CM | POA: Diagnosis not present

## 2020-07-31 DIAGNOSIS — I482 Chronic atrial fibrillation, unspecified: Secondary | ICD-10-CM | POA: Diagnosis not present

## 2020-08-06 ENCOUNTER — Other Ambulatory Visit: Payer: Self-pay

## 2020-08-06 NOTE — Patient Outreach (Signed)
[facility]) Care Management  08/06/2020  [name] [birth year] [id number]  Referral for medication assistance from Jon Billings, RN sent to [place].  Ina Homes [facility] Management Assistant [phone]

## 2020-08-06 NOTE — Patient Instructions (Signed)
Goals    . Track and Manage My Symptoms     Timeframe:  Long-Range Goal Priority:  High Start Date:  03/04/20                          Expected End Date:    02/21/21                  Follow Up Date 11/21/20   - follow rescue plan if symptoms flare-up    Why is this important?   Tracking your symptoms and other information about your health helps your doctor plan your care.  Write down the symptoms, the time of day, what you were doing and what medicine you are taking.  You will soon learn how to manage your symptoms.     Notes: Keep up the great work!!

## 2020-08-06 NOTE — Patient Outreach (Signed)
[facility]) Care Management  [place]  08/06/2020   HEBERT DOOLING Apr 25, 1956 [id number]  Subjective: Telephone call to patient for follow up disease management. Patient managing well.  Reports breathing is some better with dupixent.  Discussed COPD management and when to notify physician. Patient verbalized understanding.   Objective:   Encounter Medications:  Outpatient Encounter Medications as of 08/06/2020  Medication Sig Note  . acetaminophen (TYLENOL) 500 MG tablet Take 1,000 mg by mouth daily as needed for moderate pain or headache.   . ALPRAZolam (XANAX) 1 MG tablet Take 1 mg by mouth 2 (two) times daily as needed for anxiety or sleep.    Marland Kitchen aspirin EC 81 MG tablet Take 1 tablet (81 mg total) by mouth daily. (Patient taking differently: Take 81 mg by mouth daily after supper. )   . budesonide (PULMICORT) 0.25 MG/2ML nebulizer solution Inhale into the lungs.   . citalopram (CELEXA) 10 MG tablet Take 10 mg by mouth daily.   Marland Kitchen docusate sodium (COLACE) 100 MG capsule Take 100 mg by mouth daily as needed (for constipation.).    Marland Kitchen ENTRESTO 24-26 MG Take 1 tablet by mouth 2 (two) times daily.   . fluticasone (FLONASE) 50 MCG/ACT nasal spray Place 2 sprays into both nostrils daily.   . fluticasone-salmeterol (ADVAIR HFA) 115-21 MCG/ACT inhaler USE 2 INHALATIONS ORALLY EVERY 12 HOURS (Patient taking differently: Inhale 2 puffs into the lungs every 12 (twelve) hours. )   . furosemide (LASIX) 20 MG tablet Take 2 tablets (40 mg total) by mouth daily. (Patient taking differently: Take 20 mg by mouth daily. )   . gabapentin (NEURONTIN) 100 MG capsule Take 300 mg by mouth in the morning, at noon, and at bedtime.   Marland Kitchen guaiFENesin (MUCINEX) 600 MG 12 hr tablet Take 600 mg by mouth every evening.    Marland Kitchen ipratropium (ATROVENT) 0.03 % nasal spray Place 2 sprays into both nostrils 2 (two) times daily.    . Ipratropium-Albuterol (COMBIVENT RESPIMAT) 20-100 MCG/ACT AERS respimat  Inhale 1 puff into the lungs every 4 (four) hours.  07/27/2019: Pt confirmed he takes every 4 hours  . JARDIANCE 10 MG TABS tablet    . levalbuterol (XOPENEX) 1.25 MG/3ML nebulizer solution Take 1.25 mg (3 mLs total) by nebulization every 4 (four) hours. (Patient taking differently: Take 3 mLs by nebulization every 4 (four) hours as needed for wheezing or shortness of breath. )   . loratadine (CLARITIN) 10 MG tablet Take 10 mg by mouth at bedtime.   . lovastatin (MEVACOR) 20 MG tablet Take 20 mg by mouth at bedtime.   . metoprolol succinate (TOPROL-XL) 25 MG 24 hr tablet Take 25 mg by mouth daily.   . mupirocin ointment (BACTROBAN) 2 % Apply 1 application topically daily.   . nitroGLYCERIN (NITROSTAT) 0.4 MG SL tablet Place 0.4 mg under the tongue every 5 (five) minutes as needed for chest pain.   Marland Kitchen oxyCODONE-acetaminophen (PERCOCET) 7.5-325 MG tablet Take 1-2 tablets by mouth every 6 (six) hours as needed for moderate pain.   . pantoprazole (PROTONIX) 40 MG tablet Take 40 mg by mouth daily before breakfast.    . Respiratory Therapy Supplies (FLUTTER) DEVI Use as directed.   . roflumilast (DALIRESP) 500 MCG TABS tablet Take 500 mcg by mouth daily.   . sildenafil (VIAGRA) 25 MG tablet Take by mouth.   . SPIRIVA RESPIMAT 1.25 MCG/ACT AERS INHALE 2 PUFFS BY MOUTH INTO THE LUNGS DAILY   . tamsulosin (FLOMAX)  0.4 MG CAPS capsule Take 0.4 mg by mouth daily after supper.    . vitamin B-12 (CYANOCOBALAMIN) 1000 MCG tablet Take 1,000 mcg by mouth daily.   Marland Kitchen warfarin (COUMADIN) 5 MG tablet Take 5 mg by mouth daily.   . Wound Dressings (RESTORE WOUND CARE DRESSING) PADS Apply 1 each topically daily.     No facility-administered encounter medications on file as of 08/06/2020.    Functional Status:  In your present state of health, do you have any difficulty performing the following activities: 08/06/2020 02/01/2020  Hearing? N N  Vision? N N  Difficulty concentrating or making decisions? N N  Walking or  climbing stairs? [name]  Comment Patient double amputee Patient double amputee  Dressing or bathing? N N  Doing errands, shopping? Y Y  Comment Wife assists Wife assists  Preparing Food and eating ? N N  Using the Toilet? N N  In the past six months, have you accidently leaked urine? N N  Do you have problems with loss of bowel control? N N  Managing your Medications? N N  Managing your Finances? [name]  Comment wife assists wife assists  Housekeeping or managing your Housekeeping? [name]  Comment wife assists wife assists  Some recent data might be hidden    Fall/Depression Screening: Fall Risk  08/06/2020 02/01/2020 12/20/2019  Falls in the past year? 0 1 1  Number falls in past yr: - 0 0  Injury with Fall? - 0 0  Risk for fall due to : - - History of fall(s)  Follow up - - Falls evaluation completed   PHQ 2/9 Scores 08/06/2020 04/05/2020 02/01/2020 12/01/2019 10/30/2019  PHQ - 2 Score 0 0 0 0 0    Assessment:  Goals Addressed            This Visit's Progress   . Track and Manage My Symptoms   On track    Timeframe:  Long-Range Goal Priority:  High Start Date:  03/04/20                          Expected End Date:    02/21/21                  Follow Up Date 11/21/20   - follow rescue plan if symptoms flare-up    Why is this important?   Tracking your symptoms and other information about your health helps your doctor plan your care.  Write down the symptoms, the time of day, what you were doing and what medicine you are taking.  You will soon learn how to manage your symptoms.     Notes: Keep up the great work!!       Plan: RN CM will contact in June Follow-up:  Patient agrees to Care Plan and Follow-up.   [name], RN, MSN [place] Management Care Management Coordinator Direct Line [phone] Cell [phone] Toll Free: [phone]  Fax: [phone]

## 2020-08-09 ENCOUNTER — Ambulatory Visit
Admission: RE | Admit: 2020-08-09 | Discharge: 2020-08-09 | Disposition: A | Discharge status: Home / Self Care | Payer: Medicare HMO | Source: Ambulatory Visit | Attending: Pulmonary Disease | Admitting: Pulmonary Disease

## 2020-08-09 ENCOUNTER — Other Ambulatory Visit: Payer: Self-pay

## 2020-08-09 DIAGNOSIS — J449 Chronic obstructive pulmonary disease, unspecified: Secondary | ICD-10-CM | POA: Insufficient documentation

## 2020-08-09 DIAGNOSIS — I2510 Atherosclerotic heart disease of native coronary artery without angina pectoris: Secondary | ICD-10-CM | POA: Diagnosis not present

## 2020-08-09 DIAGNOSIS — I708 Atherosclerosis of other arteries: Secondary | ICD-10-CM | POA: Diagnosis not present

## 2020-08-09 DIAGNOSIS — J432 Centrilobular emphysema: Secondary | ICD-10-CM | POA: Diagnosis not present

## 2020-08-09 DIAGNOSIS — J984 Other disorders of lung: Secondary | ICD-10-CM | POA: Diagnosis not present

## 2020-08-21 DIAGNOSIS — J441 Chronic obstructive pulmonary disease with (acute) exacerbation: Secondary | ICD-10-CM | POA: Diagnosis not present

## 2020-08-21 DIAGNOSIS — J9610 Chronic respiratory failure, unspecified whether with hypoxia or hypercapnia: Secondary | ICD-10-CM | POA: Diagnosis not present

## 2020-08-23 ENCOUNTER — Other Ambulatory Visit: Payer: Self-pay

## 2020-08-23 ENCOUNTER — Other Ambulatory Visit
Admission: RE | Admit: 2020-08-23 | Discharge: 2020-08-23 | Disposition: A | Discharge status: Home / Self Care | Payer: Medicare HMO | Source: Ambulatory Visit | Attending: Gastroenterology | Admitting: Gastroenterology

## 2020-08-23 DIAGNOSIS — Z20822 Contact with and (suspected) exposure to covid-19: Secondary | ICD-10-CM | POA: Insufficient documentation

## 2020-08-23 DIAGNOSIS — Z01812 Encounter for preprocedural laboratory examination: Secondary | ICD-10-CM | POA: Insufficient documentation

## 2020-08-24 DIAGNOSIS — J441 Chronic obstructive pulmonary disease with (acute) exacerbation: Secondary | ICD-10-CM | POA: Diagnosis not present

## 2020-08-24 DIAGNOSIS — M5000 Cervical disc disorder with myelopathy, unspecified cervical region: Secondary | ICD-10-CM | POA: Diagnosis not present

## 2020-08-24 LAB — SARS CORONAVIRUS 2 (TAT 6-24 HRS): SARS Coronavirus 2: NEGATIVE

## 2020-08-26 ENCOUNTER — Encounter: Payer: Self-pay | Admitting: *Deleted

## 2020-08-27 ENCOUNTER — Ambulatory Visit
Admission: RE | Admit: 2020-08-27 | Discharge: 2020-08-27 | Disposition: A | Discharge status: Home / Self Care | Payer: Medicare HMO | Attending: Gastroenterology | Admitting: Gastroenterology

## 2020-08-27 ENCOUNTER — Ambulatory Visit: Payer: Medicare HMO | Admitting: Anesthesiology

## 2020-08-27 ENCOUNTER — Encounter: Payer: Self-pay | Admitting: *Deleted

## 2020-08-27 ENCOUNTER — Encounter
Admission: RE | Disposition: A | Discharge status: Home / Self Care | Payer: Self-pay | Source: Home / Self Care | Attending: Gastroenterology

## 2020-08-27 ENCOUNTER — Other Ambulatory Visit: Payer: Self-pay

## 2020-08-27 DIAGNOSIS — D6851 Activated protein C resistance: Secondary | ICD-10-CM | POA: Diagnosis not present

## 2020-08-27 DIAGNOSIS — J439 Emphysema, unspecified: Secondary | ICD-10-CM | POA: Insufficient documentation

## 2020-08-27 DIAGNOSIS — K219 Gastro-esophageal reflux disease without esophagitis: Secondary | ICD-10-CM | POA: Diagnosis not present

## 2020-08-27 DIAGNOSIS — Z7982 Long term (current) use of aspirin: Secondary | ICD-10-CM | POA: Insufficient documentation

## 2020-08-27 DIAGNOSIS — Z1211 Encounter for screening for malignant neoplasm of colon: Secondary | ICD-10-CM | POA: Insufficient documentation

## 2020-08-27 DIAGNOSIS — D122 Benign neoplasm of ascending colon: Secondary | ICD-10-CM | POA: Insufficient documentation

## 2020-08-27 DIAGNOSIS — Z79899 Other long term (current) drug therapy: Secondary | ICD-10-CM | POA: Diagnosis not present

## 2020-08-27 DIAGNOSIS — Z7952 Long term (current) use of systemic steroids: Secondary | ICD-10-CM | POA: Diagnosis not present

## 2020-08-27 DIAGNOSIS — Z7901 Long term (current) use of anticoagulants: Secondary | ICD-10-CM | POA: Insufficient documentation

## 2020-08-27 DIAGNOSIS — Z85038 Personal history of other malignant neoplasm of large intestine: Secondary | ICD-10-CM | POA: Diagnosis not present

## 2020-08-27 DIAGNOSIS — K635 Polyp of colon: Secondary | ICD-10-CM | POA: Diagnosis not present

## 2020-08-27 DIAGNOSIS — Z9981 Dependence on supplemental oxygen: Secondary | ICD-10-CM | POA: Insufficient documentation

## 2020-08-27 DIAGNOSIS — Z888 Allergy status to other drugs, medicaments and biological substances status: Secondary | ICD-10-CM | POA: Diagnosis not present

## 2020-08-27 DIAGNOSIS — K5790 Diverticulosis of intestine, part unspecified, without perforation or abscess without bleeding: Secondary | ICD-10-CM | POA: Diagnosis not present

## 2020-08-27 DIAGNOSIS — J449 Chronic obstructive pulmonary disease, unspecified: Secondary | ICD-10-CM | POA: Diagnosis not present

## 2020-08-27 DIAGNOSIS — Z7951 Long term (current) use of inhaled steroids: Secondary | ICD-10-CM | POA: Insufficient documentation

## 2020-08-27 DIAGNOSIS — K5730 Diverticulosis of large intestine without perforation or abscess without bleeding: Secondary | ICD-10-CM | POA: Diagnosis not present

## 2020-08-27 DIAGNOSIS — I509 Heart failure, unspecified: Secondary | ICD-10-CM | POA: Diagnosis not present

## 2020-08-27 DIAGNOSIS — I5023 Acute on chronic systolic (congestive) heart failure: Secondary | ICD-10-CM | POA: Diagnosis not present

## 2020-08-27 DIAGNOSIS — I110 Hypertensive heart disease with heart failure: Secondary | ICD-10-CM | POA: Diagnosis not present

## 2020-08-27 DIAGNOSIS — I739 Peripheral vascular disease, unspecified: Secondary | ICD-10-CM | POA: Diagnosis not present

## 2020-08-27 DIAGNOSIS — Z95810 Presence of automatic (implantable) cardiac defibrillator: Secondary | ICD-10-CM | POA: Diagnosis not present

## 2020-08-27 DIAGNOSIS — Z86010 Personal history of colonic polyps: Secondary | ICD-10-CM | POA: Diagnosis not present

## 2020-08-27 HISTORY — PX: COLONOSCOPY WITH PROPOFOL: SHX5780

## 2020-08-27 SURGERY — COLONOSCOPY WITH PROPOFOL
Anesthesia: General

## 2020-08-27 MED ORDER — PROPOFOL 500 MG/50ML IV EMUL
INTRAVENOUS | Status: AC
  Filled 2020-08-27: qty 50

## 2020-08-27 MED ORDER — LIDOCAINE HCL (CARDIAC) PF 100 MG/5ML IV SOSY
PREFILLED_SYRINGE | INTRAVENOUS | Status: DC | PRN
  Administered 2020-08-27: 30 mg via INTRAVENOUS

## 2020-08-27 MED ORDER — LACTATED RINGERS IV SOLN: INTRAVENOUS | Status: DC | PRN

## 2020-08-27 MED ORDER — SODIUM CHLORIDE 0.9 % IV SOLN: INTRAVENOUS | Status: DC

## 2020-08-27 MED ORDER — PHENYLEPHRINE HCL (PRESSORS) 10 MG/ML IV SOLN
INTRAVENOUS | Status: DC | PRN
  Administered 2020-08-27: 100 ug via INTRAVENOUS

## 2020-08-27 MED ORDER — PROPOFOL 10 MG/ML IV BOLUS
INTRAVENOUS | Status: DC | PRN
  Administered 2020-08-27: 20 mg via INTRAVENOUS
  Administered 2020-08-27: 50 mg via INTRAVENOUS
  Administered 2020-08-27 (×8): 20 mg via INTRAVENOUS

## 2020-08-27 NOTE — Interval H&P Note (Signed)
History and Physical Interval Note:  08/27/2020 10:36 AM  Tyler [name]  has presented today for surgery, with the diagnosis of PERSONAL HX.OF COLON POLYPS.  The various methods of treatment have been discussed with the patient and family. After consideration of risks, benefits and other options for treatment, the patient has consented to  Procedure(s) with comments: COLONOSCOPY WITH PROPOFOL (N/A) - REQ MID AM as a surgical intervention.  The patient's history has been reviewed, patient examined, no change in status, stable for surgery.  I have reviewed the patient's chart and labs.  Questions were answered to the patient's satisfaction.     Lesly Rubenstein  Ok to proceed with colonoscopy

## 2020-08-27 NOTE — Anesthesia Postprocedure Evaluation (Signed)
Anesthesia Post Note  Patient: Tyler [name]  Procedure(s) Performed: COLONOSCOPY WITH PROPOFOL (N/A )  Patient location during evaluation: Endoscopy Anesthesia Type: General Level of consciousness: awake and alert and oriented Pain management: pain level controlled Vital Signs Assessment: post-procedure vital signs reviewed and stable Respiratory status: spontaneous breathing, nonlabored ventilation and respiratory function stable Cardiovascular status: blood pressure returned to baseline and stable Postop Assessment: no signs of nausea or vomiting Anesthetic complications: no   No complications documented.   Last Vitals:  Vitals:   08/27/20 1130 08/27/20 1140  BP: 119/81 123/80  Pulse: 100 84  Resp: (!) 22 (!) 62  Temp:    SpO2: 97% 97%    Last Pain:  Vitals:   08/27/20 1140  TempSrc:   PainSc: 0-No pain                 Jaevin Medearis

## 2020-08-27 NOTE — Anesthesia Preprocedure Evaluation (Signed)
Anesthesia Evaluation  Patient identified by MRN, date of birth, ID band Patient awake    Reviewed: Allergy & Precautions, NPO status , Patient's Chart, lab work & pertinent test results  History of Anesthesia Complications Negative for: history of anesthetic complications  Airway Mallampati: II  TM Distance: >3 FB Neck ROM: Full    Dental  (+) Poor Dentition   Pulmonary sleep apnea , COPD,  oxygen dependent, former smoker,    breath sounds clear to auscultation- rhonchi (-) wheezing      Cardiovascular hypertension, Pt. on medications + CAD, + Past MI, + Cardiac Stents and +CHF  + dysrhythmias Atrial Fibrillation + pacemaker + Cardiac Defibrillator  Rhythm:Regular Rate:Normal - Systolic murmurs and - Diastolic murmurs    Neuro/Psych neg Seizures PSYCHIATRIC DISORDERS Anxiety Depression negative neurological ROS     GI/Hepatic Neg liver ROS, GERD  ,  Endo/Other  negative endocrine ROSneg diabetes  Renal/GU negative Renal ROS     Musculoskeletal  (+) Arthritis ,   Abdominal (+) - obese,   Peds  Hematology negative hematology ROS (+)   Anesthesia Other Findings Past Medical History: No date: AICD (automatic cardioverter/defibrillator) present No date: Anxiety No date: Arthritis 09/2019: Atherosclerosis of artery of extremity with ulceration (HCC)     Comment:  left foot s/p toe amp requiring debridement and futher               toe amputations. No date: Atrial fibrillation (HCC) No date: Cervical spinal stenosis     Comment:  with neuropathy No date: CHF (congestive heart failure) (HCC) No date: Constipation No date: COPD (chronic obstructive pulmonary disease) (HCC) No date: Coronary artery disease No date: Depression No date: Dyspnea No date: Dysrhythmia     Comment:  atrial fibrillation No date: Emphysema of lung (HCC) No date: Factor 5 Leiden mutation, heterozygous ([place])     Comment:  on coumadin No  date: Factor V Leiden mutation ([place]) No date: GERD (gastroesophageal reflux disease) No date: Hypertension No date: Lung nodule seen on imaging study     Comment:  being followed by dr. [name]. just watching it for last               few years, without change No date: Mitral valve insufficiency No date: Moderate tricuspid insufficiency 2004: Myocardial infarction [facility])     Comment:  stent placed, pacemaker implanted 2005 10/2019: Osteomyelitis ([place])     Comment:  left foot No date: Oxygen dependent     Comment:  requires 2L nasal prong oxygen 24 hours a day No date: Peripheral vascular disease ([place]) 1610,[id number]: Presence of permanent cardiac pacemaker No date: PVD (peripheral vascular disease) ([place]) No date: Sleep apnea     Comment:  waiting to have sleep study. Used bipap while               hospitalized and said it was great for him.   Reproductive/Obstetrics                            Anesthesia Physical Anesthesia Plan  ASA: IV  Anesthesia Plan: General   Post-op Pain Management:    Induction: Intravenous  PONV Risk Score and Plan: 1 and Propofol infusion  Airway Management Planned: Natural Airway  Additional Equipment:   Intra-op Plan:   Post-operative Plan:   Informed Consent: I have reviewed the patients History and Physical, chart, labs and discussed the procedure including the risks, benefits and  alternatives for the proposed anesthesia with the patient or authorized representative who has indicated his/her understanding and acceptance.     Dental advisory given  Plan Discussed with: CRNA and Anesthesiologist  Anesthesia Plan Comments:         Anesthesia Quick Evaluation

## 2020-08-27 NOTE — Op Note (Signed)
[facility] Gastroenterology Patient Name: Tyler [name] Procedure Date: 08/27/2020 10:29 AM MRN: [id number] Account #: [id number] Date of Birth: 01/12/1956 Admit Type: Outpatient Age: 65 Room: [facility] ENDO ROOM 1 Gender: Male Note Status: Finalized Procedure:             Colonoscopy Indications:           High risk colon cancer surveillance: Personal history                         of adenoma with villous component Providers:             Andrey Farmer MD, MD Referring MD:          [name], MD (Referring MD) Medicines:             Monitored Anesthesia Care Complications:         No immediate complications. Estimated blood loss:                         Minimal. Procedure:             Pre-Anesthesia Assessment:                        - Prior to the procedure, a History and Physical was                         performed, and patient medications and allergies were                         reviewed. The patient is competent. The risks and                         benefits of the procedure and the sedation options and                         risks were discussed with the patient. All questions                         were answered and informed consent was obtained.                         Patient identification and proposed procedure were                         verified by the physician, the nurse, the anesthetist                         and the technician in the endoscopy suite. Mental                         Status Examination: alert and oriented. Airway                         Examination: normal oropharyngeal airway and neck                         mobility. Respiratory Examination: clear to  auscultation. CV Examination: normal. Prophylactic                         Antibiotics: The patient does not require prophylactic                         antibiotics. Prior Anticoagulants: The patient has                         taken Coumadin  (warfarin), last dose was 5 days prior                         to procedure. ASA Grade Assessment: IV - A patient                         with severe systemic disease that is a constant threat                         to life. After reviewing the risks and benefits, the                         patient was deemed in satisfactory condition to                         undergo the procedure. The anesthesia plan was to use                         monitored anesthesia care (MAC). Immediately prior to                         administration of medications, the patient was                         re-assessed for adequacy to receive sedatives. The                         heart rate, respiratory rate, oxygen saturations,                         blood pressure, adequacy of pulmonary ventilation, and                         response to care were monitored throughout the                         procedure. The physical status of the patient was                         re-assessed after the procedure.                        After obtaining informed consent, the colonoscope was                         passed under direct vision. Throughout the procedure,                         the patient's blood pressure, pulse, and oxygen  saturations were monitored continuously. The                         Colonoscope was introduced through the anus and                         advanced to the the cecum, identified by appendiceal                         orifice and ileocecal valve. The colonoscopy was                         performed without difficulty. The patient tolerated                         the procedure well. The quality of the bowel                         preparation was adequate to identify polyps. Findings:      The perianal and digital rectal examinations were normal.      A 4 mm polyp was found in the ascending colon. The polyp was sessile.       The polyp was removed with a cold  snare. Resection and retrieval were       complete. Estimated blood loss was minimal.      A tattoo was seen in the descending colon. A post-polypectomy scar was       found at the tattoo site. There was no evidence of residual polyp tissue.      Scattered small-mouthed diverticula were found in the sigmoid colon,       descending colon, splenic flexure and ascending colon.      The exam was otherwise without abnormality on direct and retroflexion       views. Impression:            - One 4 mm polyp in the ascending colon, removed with                         a cold snare. Resected and retrieved.                        - A tattoo was seen in the descending colon. A                         post-polypectomy scar was found at the tattoo site.                         There was no evidence of residual polyp tissue.                        - Diverticulosis in the sigmoid colon, in the                         descending colon, at the splenic flexure and in the                         ascending colon.                        -  The examination was otherwise normal on direct and                         retroflexion views. Recommendation:        - Discharge patient to home.                        - Resume previous diet.                        - Resume Coumadin (warfarin) at prior dose today.                        - Await pathology results.                        - Repeat colonoscopy in 3 years for surveillance.                        - Return to referring physician as previously                         scheduled. Procedure Code(s):     --- Professional ---                        [id number], Colonoscopy, flexible; with removal of                         tumor(s), polyp(s), or other lesion(s) by snare                         technique Diagnosis Code(s):     --- Professional ---                        Z86.010, Personal history of colonic polyps                        K63.5, Polyp of colon                         K57.30, Diverticulosis of large intestine without                         perforation or abscess without bleeding CPT copyright 2019 [employer] Association. All rights reserved. The codes documented in this report are preliminary and upon coder review may  be revised to meet current compliance requirements. Andrey Farmer MD, MD 08/27/2020 11:10:21 AM Number of Addenda: 0 Note Initiated On: 08/27/2020 10:29 AM Scope Withdrawal Time: 0 hours 14 minutes 37 seconds  Total Procedure Duration: 0 hours 22 minutes 29 seconds  Estimated Blood Loss:  Estimated blood loss was minimal.      [facility]

## 2020-08-27 NOTE — Transfer of Care (Signed)
Immediate Anesthesia Transfer of Care Note  Patient: Tyler [name]  Procedure(s) Performed: COLONOSCOPY WITH PROPOFOL (N/A )  Patient Location: PACU and Endoscopy Unit  Anesthesia Type:MAC and General  Level of Consciousness: awake, alert  and oriented  Airway & Oxygen Therapy: Patient Spontanous Breathing  Post-op Assessment: Report given to RN and Post -op Vital signs reviewed and stable  Post vital signs: Reviewed and stable  Last Vitals:  Vitals Value Taken Time  BP 111/71 08/27/20 1110  Temp    Pulse 74 08/27/20 1110  Resp 20 08/27/20 1110  SpO2 99 % 08/27/20 1110    Last Pain:  Vitals:   08/27/20 1110  TempSrc:   PainSc: Asleep         Complications: No complications documented.

## 2020-08-27 NOTE — H&P (Signed)
Outpatient short stay form Pre-procedure 08/27/2020 10:33 AM Raylene Miyamoto MD, MPH  Primary Physician: Dr. Edwina Barth  Reason for visit:  History of piecemeal polypectomy  History of present illness:   65 y/o gentleman with history of COPD and PAD requiring amputations here for follow-up colonoscopy for piecemeal resection of TVA. Area was tattooed. He held his coumadin 5 days ago. No family history of GI malignancies. No significant abdominal surgeries.    Current Facility-Administered Medications:  .  0.9 %  sodium chloride infusion, , Intravenous, Continuous, Groom, Benay Pike, MD, Last Rate: 20 mL/hr at 08/27/20 0954, New Bag at 08/27/20 0954  Medications Prior to Admission  Medication Sig Dispense Refill Last Dose  . acetaminophen (TYLENOL) 500 MG tablet Take 1,000 mg by mouth daily as needed for moderate pain or headache.   Past Week at Unknown time  . ALPRAZolam (XANAX) 1 MG tablet Take 1 mg by mouth 2 (two) times daily as needed for anxiety or sleep.    08/26/2020 at 2200  . aspirin EC 81 MG tablet Take 1 tablet (81 mg total) by mouth daily. (Patient taking differently: Take 81 mg by mouth daily after supper.) 150 tablet 2 Past Week at Unknown time  . azithromycin (ZITHROMAX) 250 MG tablet Take 250 mg by mouth daily. Monday, Wednesday, Friday for 90 days   08/26/2020 at 0800  . budesonide (PULMICORT) 0.25 MG/2ML nebulizer solution Inhale into the lungs.   08/26/2020 at 0800  . citalopram (CELEXA) 10 MG tablet Take 10 mg by mouth daily.   Past Week at 0800  . docusate sodium (COLACE) 100 MG capsule Take 100 mg by mouth daily as needed (for constipation.).    Past Week at Unknown time  . Dupilumab (DUPIXENT) 300 MG/2ML SOPN Inject 300 mg into the skin every 14 (fourteen) days. Inject 2 mLs (300mg ).   Past Week at 0800  . ENTRESTO 24-26 MG Take 1 tablet by mouth 2 (two) times daily. 60 tablet 0 08/26/2020 at 0800  . fluticasone (FLONASE) 50 MCG/ACT nasal spray Place 2 sprays into both nostrils  daily.   08/26/2020 at 080  . fluticasone-salmeterol (ADVAIR HFA) 115-21 MCG/ACT inhaler USE 2 INHALATIONS ORALLY EVERY 12 HOURS (Patient taking differently: Inhale 2 puffs into the lungs every 12 (twelve) hours.) 36 Inhaler 5 08/26/2020 at 080  . furosemide (LASIX) 20 MG tablet Take 20 mg by mouth.   08/26/2020 at 0800  . gabapentin (NEURONTIN) 100 MG capsule Take 300 mg by mouth in the morning, at noon, and at bedtime.   08/27/2020 at 0600  . Gauze Pads & Dressings (RESTORE FOAM DRESSING) 6"X8" PADS by Does not apply route.   08/26/2020 at Unknown time  . guaiFENesin (MUCINEX) 600 MG 12 hr tablet Take 600 mg by mouth every evening.    08/26/2020 at 2000  . ipratropium (ATROVENT) 0.03 % nasal spray Place 2 sprays into both nostrils 2 (two) times daily.    08/26/2020 at 0800  . Ipratropium-Albuterol (COMBIVENT) 20-100 MCG/ACT AERS respimat Inhale 1 puff into the lungs every 4 (four) hours.   08/26/2020 at 0800  . JARDIANCE 10 MG TABS tablet Take 10 mg by mouth daily with breakfast.   08/26/2020 at 0800  . levalbuterol (XOPENEX) 1.25 MG/3ML nebulizer solution Take 1.25 mg (3 mLs total) by nebulization every 4 (four) hours. (Patient taking differently: Take 3 mLs by nebulization every 4 (four) hours as needed for wheezing or shortness of breath.) 72 mL 12 08/26/2020 at 2000  . loratadine (CLARITIN)  10 MG tablet Take 10 mg by mouth at bedtime.   Past Week at Unknown time  . lovastatin (MEVACOR) 20 MG tablet Take 20 mg by mouth at bedtime.   Past Week at Unknown time  . metoprolol succinate (TOPROL-XL) 25 MG 24 hr tablet Take 25 mg by mouth daily.   08/26/2020 at 0800  . montelukast (SINGULAIR) 10 MG tablet Take 10 mg by mouth at bedtime.   08/26/2020 at 200  . mupirocin ointment (BACTROBAN) 2 % Apply 1 application topically daily. 22 g 0 Past Week at Unknown time  . nitroGLYCERIN (NITROSTAT) 0.4 MG SL tablet Place 0.4 mg under the tongue every 5 (five) minutes as needed for chest pain.   Past Week at Unknown time  .  oxyCODONE-acetaminophen (PERCOCET) 7.5-325 MG tablet Take 1-2 tablets by mouth every 6 (six) hours as needed for moderate pain. 50 tablet 0 08/27/2020 at 0600  . pantoprazole (PROTONIX) 40 MG tablet Take 40 mg by mouth daily before breakfast.    08/26/2020 at 0800  . predniSONE (DELTASONE) 20 MG tablet Take 20 mg by mouth daily.   08/26/2020 at 0800  . Respiratory Therapy Supplies (FLUTTER) DEVI Use as directed. 1 each 0 Past Week at Unknown time  . roflumilast (DALIRESP) 500 MCG TABS tablet Take 250 mcg by mouth daily.   08/26/2020 at 0800  . tamsulosin (FLOMAX) 0.4 MG CAPS capsule Take 0.4 mg by mouth daily after supper.   11 08/26/2020 at 0800  . vitamin B-12 (CYANOCOBALAMIN) 1000 MCG tablet Take 1,000 mcg by mouth daily.   08/26/2020 at 800  . warfarin (COUMADIN) 5 MG tablet Take 5 mg by mouth daily.   08/22/2020 at Unknown time  . Wound Dressings (RESTORE WOUND CARE DRESSING) PADS Apply 1 each topically daily.    08/26/2020 at Unknown time  . furosemide (LASIX) 20 MG tablet Take 2 tablets (40 mg total) by mouth daily. (Patient taking differently: Take 20 mg by mouth daily.) 60 tablet 0   . sildenafil (VIAGRA) 25 MG tablet Take by mouth.     . SPIRIVA RESPIMAT 1.25 MCG/ACT AERS INHALE 2 PUFFS BY MOUTH INTO THE LUNGS DAILY (Patient not taking: Reported on 08/27/2020) 4 g 6 Completed Course at Unknown time     Allergies  Allergen Reactions  . Apixaban Rash  . Rivaroxaban Rash     Past Medical History:  Diagnosis Date  . AICD (automatic cardioverter/defibrillator) present   . Anxiety   . Arthritis   . Atherosclerosis of artery of extremity with ulceration ([place]) 09/2019   left foot s/p toe amp requiring debridement and futher toe amputations.  . Atrial fibrillation ([place])   . Cervical spinal stenosis    with neuropathy  . CHF (congestive heart failure) ([place])   . Constipation   . COPD (chronic obstructive pulmonary disease) ([place])   . Coronary artery disease   . Depression   . Dyspnea   .  Dysrhythmia    atrial fibrillation  . Emphysema of lung ([place])   . Factor 5 Leiden mutation, heterozygous ([place])    on coumadin  . Factor V Leiden mutation ([place])   . GERD (gastroesophageal reflux disease)   . Hypertension   . Lung nodule seen on imaging study    being followed by dr. [name]. just watching it for last few years, without change  . Mitral valve insufficiency   . Moderate tricuspid insufficiency   . Myocardial infarction [facility]) 2004   stent placed, pacemaker implanted 2005  .  Osteomyelitis ([place]) 10/2019   left foot  . Oxygen dependent    requires 2L nasal prong oxygen 24 hours a day  . Peripheral vascular disease ([place])   . Presence of permanent cardiac pacemaker 2005,2018  . PVD (peripheral vascular disease) ([place])   . Sleep apnea    waiting to have sleep study. Used bipap while hospitalized and said it was great for him.    Review of systems:  Otherwise negative.    Physical Exam  Gen: Alert, oriented. Appears stated age.  HEENT: PERRLA. Lungs: No respiratory distress CV: RRR Abd: soft, benign, no masses Ext: No edema    Planned procedures: Proceed with colonoscopy. The patient understands the nature of the planned procedure, indications, risks, alternatives and potential complications including but not limited to bleeding, infection, perforation, damage to internal organs and possible oversedation/side effects from anesthesia. The patient agrees and gives consent to proceed.  Please refer to procedure notes for findings, recommendations and patient disposition/instructions.     Raylene Miyamoto MD, MPH Gastroenterology 08/27/2020  10:33 AM

## 2020-08-28 LAB — SURGICAL PATHOLOGY

## 2020-09-04 ENCOUNTER — Telehealth: Payer: Self-pay | Admitting: Family

## 2020-09-04 NOTE — Telephone Encounter (Signed)
Called and told patient he was approved for patient assistance for Entresto till the end of the year and advised patient to call [employer] to set up mail order.   Bessy Reaney, NT

## 2020-09-10 DIAGNOSIS — I5022 Chronic systolic (congestive) heart failure: Secondary | ICD-10-CM | POA: Diagnosis not present

## 2020-09-10 DIAGNOSIS — J431 Panlobular emphysema: Secondary | ICD-10-CM | POA: Diagnosis not present

## 2020-09-10 DIAGNOSIS — I482 Chronic atrial fibrillation, unspecified: Secondary | ICD-10-CM | POA: Diagnosis not present

## 2020-09-12 DIAGNOSIS — J431 Panlobular emphysema: Secondary | ICD-10-CM | POA: Diagnosis not present

## 2020-09-12 DIAGNOSIS — Z01818 Encounter for other preprocedural examination: Secondary | ICD-10-CM | POA: Diagnosis not present

## 2020-09-23 DIAGNOSIS — J431 Panlobular emphysema: Secondary | ICD-10-CM | POA: Diagnosis not present

## 2020-09-23 DIAGNOSIS — I10 Essential (primary) hypertension: Secondary | ICD-10-CM | POA: Diagnosis not present

## 2020-09-23 DIAGNOSIS — D6851 Activated protein C resistance: Secondary | ICD-10-CM | POA: Diagnosis not present

## 2020-09-23 DIAGNOSIS — M5000 Cervical disc disorder with myelopathy, unspecified cervical region: Secondary | ICD-10-CM | POA: Diagnosis not present

## 2020-09-23 DIAGNOSIS — I482 Chronic atrial fibrillation, unspecified: Secondary | ICD-10-CM | POA: Diagnosis not present

## 2020-09-23 DIAGNOSIS — E782 Mixed hyperlipidemia: Secondary | ICD-10-CM | POA: Diagnosis not present

## 2020-09-23 DIAGNOSIS — J441 Chronic obstructive pulmonary disease with (acute) exacerbation: Secondary | ICD-10-CM | POA: Diagnosis not present

## 2020-09-23 DIAGNOSIS — I739 Peripheral vascular disease, unspecified: Secondary | ICD-10-CM | POA: Diagnosis not present

## 2020-09-23 DIAGNOSIS — F320 Major depressive disorder, single episode, mild: Secondary | ICD-10-CM | POA: Diagnosis not present

## 2020-09-23 DIAGNOSIS — J9611 Chronic respiratory failure with hypoxia: Secondary | ICD-10-CM | POA: Diagnosis not present

## 2020-09-23 DIAGNOSIS — Z89611 Acquired absence of right leg above knee: Secondary | ICD-10-CM | POA: Diagnosis not present

## 2020-10-03 ENCOUNTER — Other Ambulatory Visit: Payer: Self-pay | Admitting: *Deleted

## 2020-10-03 ENCOUNTER — Other Ambulatory Visit: Payer: Self-pay

## 2020-10-03 ENCOUNTER — Encounter: Payer: Medicare HMO | Attending: Pulmonary Disease

## 2020-10-03 VITALS — Ht 73.0 in | Wt 149.6 lb

## 2020-10-03 DIAGNOSIS — Z87891 Personal history of nicotine dependence: Secondary | ICD-10-CM | POA: Diagnosis not present

## 2020-10-03 DIAGNOSIS — Z79899 Other long term (current) drug therapy: Secondary | ICD-10-CM | POA: Diagnosis not present

## 2020-10-03 DIAGNOSIS — J439 Emphysema, unspecified: Secondary | ICD-10-CM | POA: Diagnosis not present

## 2020-10-03 DIAGNOSIS — J449 Chronic obstructive pulmonary disease, unspecified: Secondary | ICD-10-CM

## 2020-10-03 NOTE — Progress Notes (Signed)
Pulmonary Individual Treatment Plan  Patient Details  Name: Tyler [name] MRN: [id number] Date of Birth: April 17, 1956 Referring Provider:   Flowsheet Row Pulmonary Rehab from 10/03/2020 in [facility] Cardiac and Pulmonary Rehab  Referring Provider Ottie Glazier MD      Initial Encounter Date:  Flowsheet Row Pulmonary Rehab from 10/03/2020 in [facility] Cardiac and Pulmonary Rehab  Date 10/03/20      Visit Diagnosis: Chronic obstructive pulmonary disease, unspecified COPD type ([place])  Patient's Home Medications on Admission:  Current Outpatient Medications:  .  acetaminophen (TYLENOL) 500 MG tablet, Take 1,000 mg by mouth daily as needed for moderate pain or headache., Disp: , Rfl:  .  ALPRAZolam (XANAX) 1 MG tablet, Take 1 mg by mouth 2 (two) times daily as needed for anxiety or sleep. , Disp: , Rfl:  .  aspirin EC 81 MG tablet, Take 1 tablet (81 mg total) by mouth daily. (Patient taking differently: Take 81 mg by mouth daily after supper.), Disp: 150 tablet, Rfl: 2 .  azithromycin (ZITHROMAX) 250 MG tablet, Take 250 mg by mouth daily. Monday, Wednesday, Friday for 90 days, Disp: , Rfl:  .  budesonide (PULMICORT) 0.25 MG/2ML nebulizer solution, Inhale into the lungs., Disp: , Rfl:  .  citalopram (CELEXA) 10 MG tablet, Take 10 mg by mouth daily., Disp: , Rfl:  .  docusate sodium (COLACE) 100 MG capsule, Take 100 mg by mouth daily as needed (for constipation.). , Disp: , Rfl:  .  Dupilumab (DUPIXENT) 300 MG/2ML SOPN, Inject 300 mg into the skin every 14 (fourteen) days. Inject 2 mLs (300mg )., Disp: , Rfl:  .  ENTRESTO 24-26 MG, Take 1 tablet by mouth 2 (two) times daily., Disp: 60 tablet, Rfl: 0 .  EPINEPHrine 0.3 mg/0.3 mL IJ SOAJ injection, Inject into the muscle., Disp: , Rfl:  .  fluticasone (FLONASE) 50 MCG/ACT nasal spray, Place 2 sprays into both nostrils daily., Disp: , Rfl:  .  fluticasone-salmeterol (ADVAIR HFA) 115-21 MCG/ACT inhaler, USE 2 INHALATIONS ORALLY EVERY 12 HOURS (Patient  taking differently: Inhale 2 puffs into the lungs every 12 (twelve) hours.), Disp: 36 Inhaler, Rfl: 5 .  furosemide (LASIX) 20 MG tablet, Take 2 tablets (40 mg total) by mouth daily. (Patient taking differently: Take 20 mg by mouth daily.), Disp: 60 tablet, Rfl: 0 .  gabapentin (NEURONTIN) 100 MG capsule, Take 300 mg by mouth in the morning, at noon, and at bedtime., Disp: , Rfl:  .  guaiFENesin (MUCINEX) 600 MG 12 hr tablet, Take 600 mg by mouth every evening. , Disp: , Rfl:  .  ipratropium (ATROVENT) 0.03 % nasal spray, Place 2 sprays into both nostrils 2 (two) times daily. , Disp: , Rfl:  .  Ipratropium-Albuterol (COMBIVENT) 20-100 MCG/ACT AERS respimat, Inhale 1 puff into the lungs every 4 (four) hours., Disp: , Rfl:  .  ipratropium-albuterol (DUONEB) 0.5-2.5 (3) MG/3ML SOLN, Inhale into the lungs., Disp: , Rfl:  .  JARDIANCE 10 MG TABS tablet, Take 10 mg by mouth daily with breakfast., Disp: , Rfl:  .  lovastatin (MEVACOR) 20 MG tablet, Take 1 tablet by mouth daily., Disp: , Rfl:  .  metoprolol succinate (TOPROL-XL) 25 MG 24 hr tablet, Take 25 mg by mouth daily., Disp: , Rfl:  .  montelukast (SINGULAIR) 10 MG tablet, Take 10 mg by mouth at bedtime., Disp: , Rfl:  .  nitroGLYCERIN (NITROSTAT) 0.4 MG SL tablet, Place 0.4 mg under the tongue every 5 (five) minutes as needed for chest pain., Disp: ,  Rfl:  .  oxyCODONE-acetaminophen (PERCOCET) 7.5-325 MG tablet, Take 1-2 tablets by mouth every 6 (six) hours as needed for moderate pain., Disp: 50 tablet, Rfl: 0 .  pantoprazole (PROTONIX) 40 MG tablet, Take 40 mg by mouth daily before breakfast. , Disp: , Rfl:  .  predniSONE (DELTASONE) 20 MG tablet, Take 20 mg by mouth daily., Disp: , Rfl:  .  Respiratory Therapy Supplies (FLUTTER) DEVI, Use as directed., Disp: 1 each, Rfl: 0 .  tamsulosin (FLOMAX) 0.4 MG CAPS capsule, Take 0.4 mg by mouth daily after supper. , Disp: , Rfl: 11 .  vitamin B-12 (CYANOCOBALAMIN) 1000 MCG tablet, Take 1,000 mcg by mouth  daily., Disp: , Rfl:  .  warfarin (COUMADIN) 5 MG tablet, Take 5 mg by mouth daily., Disp: , Rfl:  .  furosemide (LASIX) 20 MG tablet, Take 20 mg by mouth. (Patient not taking: Reported on 10/03/2020), Disp: , Rfl:  .  Gauze Pads & Dressings (RESTORE FOAM DRESSING) 6"X8" PADS, by Does not apply route. (Patient not taking: Reported on 10/03/2020), Disp: , Rfl:  .  levalbuterol (XOPENEX) 1.25 MG/3ML nebulizer solution, Take 1.25 mg (3 mLs total) by nebulization every 4 (four) hours. (Patient not taking: Reported on 10/03/2020), Disp: 72 mL, Rfl: 12 .  loratadine (CLARITIN) 10 MG tablet, Take 10 mg by mouth at bedtime. (Patient not taking: Reported on 10/03/2020), Disp: , Rfl:  .  lovastatin (MEVACOR) 20 MG tablet, Take 20 mg by mouth at bedtime. (Patient not taking: Reported on 10/03/2020), Disp: , Rfl:  .  mupirocin ointment (BACTROBAN) 2 %, Apply 1 application topically daily. (Patient not taking: Reported on 10/03/2020), Disp: 22 g, Rfl: 0 .  oxyCODONE-acetaminophen (PERCOCET) 7.5-325 MG tablet, Take by mouth. (Patient not taking: Reported on 10/03/2020), Disp: , Rfl:  .  roflumilast (DALIRESP) 500 MCG TABS tablet, Take 250 mcg by mouth daily. (Patient not taking: Reported on 10/03/2020), Disp: , Rfl:  .  sildenafil (VIAGRA) 25 MG tablet, Take by mouth., Disp: , Rfl:  .  SPIRIVA RESPIMAT 1.25 MCG/ACT AERS, INHALE 2 PUFFS BY MOUTH INTO THE LUNGS DAILY (Patient not taking: No sig reported), Disp: 4 g, Rfl: 6 .  Wound Dressings (RESTORE WOUND CARE DRESSING) PADS, Apply 1 each topically daily.  (Patient not taking: Reported on 10/03/2020), Disp: , Rfl:   Past Medical History: Past Medical History:  Diagnosis Date  . AICD (automatic cardioverter/defibrillator) present   . Anxiety   . Arthritis   . Atherosclerosis of artery of extremity with ulceration ([place]) 09/2019   left foot s/p toe amp requiring debridement and futher toe amputations.  . Atrial fibrillation ([place])   . Cervical spinal stenosis    with  neuropathy  . CHF (congestive heart failure) ([place])   . Constipation   . COPD (chronic obstructive pulmonary disease) ([place])   . Coronary artery disease   . Depression   . Dyspnea   . Dysrhythmia    atrial fibrillation  . Emphysema of lung ([place])   . Factor 5 Leiden mutation, heterozygous ([place])    on coumadin  . Factor V Leiden mutation ([place])   . GERD (gastroesophageal reflux disease)   . Hypertension   . Lung nodule seen on imaging study    being followed by dr. [name]. just watching it for last few years, without change  . Mitral valve insufficiency   . Moderate tricuspid insufficiency   . Myocardial infarction [facility]) 2004   stent placed, pacemaker implanted 2005  . Osteomyelitis ([place]) 10/2019  left foot  . Oxygen dependent    requires 2L nasal prong oxygen 24 hours a day  . Peripheral vascular disease ([place])   . Presence of permanent cardiac pacemaker 2005,2018  . PVD (peripheral vascular disease) ([place])   . Sleep apnea    waiting to have sleep study. Used bipap while hospitalized and said it was great for him.    Tobacco Use: Social History   Tobacco Use  Smoking Status Former Smoker  . Packs/day: 1.00  . Years: 42.00  . Pack years: 42.00  . Types: Cigarettes  . Quit date: 09/23/2013  . Years since quitting: 7.0  Smokeless Tobacco Never Used    Labs: Recent Chemical engineer    Labs for ITP Cardiac and Pulmonary Rehab Latest Ref Rng & Units 10/13/2016 10/16/2016 05/05/2017 06/22/2017 10/25/2019   PHART 7.350 - 7.450 - 7.38 7.43 - -   PCO2ART 32.0 - 48.0 mmHg - 46 38 - -   HCO3 20.0 - 28.0 mmol/L 25.2 27.2 25.2 29.4(H) 31.0(H)   ACIDBASEDEF 0.0 - 2.0 mmol/L 4.4(H) - - - -   O2SAT % - 95.9 97.4 - 72.1       Pulmonary Assessment Scores:  Pulmonary Assessment Scores    Row Name 10/03/20 1708         ADL UCSD   ADL Phase Entry     SOB Score total 66     Rest 0     Walk 4     Stairs 5     Bath 2     Dress 1     Shop 4           CAT Score   CAT Score 21            mMRC Score   mMRC Score 4            UCSD: Self-administered rating of dyspnea associated with activities of daily living (ADLs) 6-point scale (0 = "not at all" to 5 = "maximal or unable to do because of breathlessness")  Scoring Scores range from 0 to 120.  Minimally important difference is 5 units  CAT: CAT can identify the health impairment of COPD patients and is better correlated with disease progression.  CAT has a scoring range of zero to 40. The CAT score is classified into four groups of low (less than 10), medium (10 - 20), high (21-30) and very high (31-40) based on the impact level of disease on health status. A CAT score over 10 suggests significant symptoms.  A worsening CAT score could be explained by an exacerbation, poor medication adherence, poor inhaler technique, or progression of COPD or comorbid conditions.  CAT MCID is 2 points  mMRC: mMRC (Modified Medical Research Council) Dyspnea Scale is used to assess the degree of baseline functional disability in patients of respiratory disease due to dyspnea. No minimal important difference is established. A decrease in score of 1 point or greater is considered a positive change.   Pulmonary Function Assessment:  Pulmonary Function Assessment - 10/03/20 1548      Pulmonary Function Tests   FVC% 73.1 %    FEV1% 30.6 %    FEV1/FVC Ratio 32      Post Bronchodilator Spirometry Results   FVC% --    FEV1% --    FEV1/FVC Ratio --      Breath   Bilateral Breath Sounds Clear;Decreased    Shortness of Breath Yes;Fear of Shortness of Breath;Panic with Shortness of  Breath;Limiting activity           Exercise Target Goals: Exercise Program Goal: Individual exercise prescription set using results from initial 6 min walk test and THRR while considering  patient's activity barriers and safety.   Exercise Prescription Goal: Initial exercise prescription builds to 30-45 minutes a day of aerobic activity, 2-3 days  per week.  Home exercise guidelines will be given to patient during program as part of exercise prescription that the participant will acknowledge.  Education: Aerobic Exercise: - Group verbal and visual presentation on the components of exercise prescription. Introduces F.I.T.T principle from ACSM for exercise prescriptions.  Reviews F.I.T.T. principles of aerobic exercise including progression. Written material given at graduation.   Education: Resistance Exercise: - Group verbal and visual presentation on the components of exercise prescription. Introduces F.I.T.T principle from ACSM for exercise prescriptions  Reviews F.I.T.T. principles of resistance exercise including progression. Written material given at graduation.    Education: Exercise & Equipment Safety: - Individual verbal instruction and demonstration of equipment use and safety with use of the equipment. Flowsheet Row Pulmonary Rehab from 10/03/2020 in [facility] Cardiac and Pulmonary Rehab  Date 10/03/20  Educator [facility]  Instruction Review Code 1- Verbalizes Understanding      Education: Exercise Physiology & General Exercise Guidelines: - Group verbal and written instruction with models to review the exercise physiology of the cardiovascular system and associated critical values. Provides general exercise guidelines with specific guidelines to those with heart or lung disease.    Education: Flexibility, Balance, Mind/Body Relaxation: - Group verbal and visual presentation with interactive activity on the components of exercise prescription. Introduces F.I.T.T principle from ACSM for exercise prescriptions. Reviews F.I.T.T. principles of flexibility and balance exercise training including progression. Also discusses the mind body connection.  Reviews various relaxation techniques to help reduce and manage stress (i.e. Deep breathing, progressive muscle relaxation, and visualization). Balance handout provided to take home. Written  material given at graduation.   Activity Barriers & Risk Stratification:  Activity Barriers & Cardiac Risk Stratification - 10/03/20 1729      Activity Barriers & Cardiac Risk Stratification   Activity Barriers Shortness of Breath;Assistive Device;Decreased Ventricular Function;Neck/Spine Problems;Muscular Weakness;Deconditioning;Balance Concerns;Other (comment)    Comments Double amputee/ prosthetic leg           6 Minute Walk:  6 Minute Walk    Row Name 10/03/20 1730         6 Minute Walk   Phase Initial     Distance 246 feet  Step test (total # of steps); wheelchair bound     Walk Time 5.2 minutes     # of Rest Breaks 1     MPH --  N/A     METS 1.08     RPE 11     Perceived Dyspnea  2     VO2 Peak 3.8     Symptoms Yes (comment)     Comments SOB     Resting HR 76 bpm     Resting BP 96/60     Resting Oxygen Saturation  97 %     Exercise Oxygen Saturation  during 6 min walk 94 %     Max Ex. HR 85 bpm     Max Ex. BP 104/60     2 Minute Post BP 100/62           Interval HR   1 Minute HR 81     2 Minute HR 82  3 Minute HR 85     4 Minute HR 83     5 Minute HR 82     6 Minute HR 80     2 Minute Post HR 76     Interval Heart Rate? Yes           Interval Oxygen   Interval Oxygen? Yes     Baseline Oxygen Saturation % 97 %     1 Minute Oxygen Saturation % 97 %     1 Minute Liters of Oxygen 2 L     2 Minute Oxygen Saturation % 98 %     2 Minute Liters of Oxygen 2 L     3 Minute Oxygen Saturation % 95 %     3 Minute Liters of Oxygen 2 L     4 Minute Oxygen Saturation % 94 %     4 Minute Liters of Oxygen 2 L     5 Minute Oxygen Saturation % 96 %     5 Minute Liters of Oxygen 2 L     6 Minute Oxygen Saturation % 97 %     6 Minute Liters of Oxygen 2 L     2 Minute Post Oxygen Saturation % 98 %     2 Minute Post Liters of Oxygen 2 L           Oxygen Initial Assessment:  Oxygen Initial Assessment - 10/03/20 1708      Home Oxygen   Home Oxygen Device  Home Concentrator;E-Tanks    Sleep Oxygen Prescription Continuous;BiPAP;CPAP    Liters per minute 2    Home Exercise Oxygen Prescription Continuous    Liters per minute 2    Home Resting Oxygen Prescription Continuous    Liters per minute 2    Compliance with Home Oxygen Use Yes      Initial 6 min Walk   Oxygen Used Continuous;E-Tanks    Liters per minute 2      Program Oxygen Prescription   Program Oxygen Prescription Continuous;E-Tanks    Liters per minute 2      Intervention   Short Term Goals To learn and exhibit compliance with exercise, home and travel O2 prescription;To learn and understand importance of maintaining oxygen saturations>88%;To learn and understand importance of monitoring SPO2 with pulse oximeter and demonstrate accurate use of the pulse oximeter.;To learn and demonstrate proper pursed lip breathing techniques or other breathing techniques.;To learn and demonstrate proper use of respiratory medications    Long  Term Goals Exhibits compliance with exercise, home and travel O2 prescription;Verbalizes importance of monitoring SPO2 with pulse oximeter and return demonstration;Maintenance of O2 saturations>88%;Exhibits proper breathing techniques, such as pursed lip breathing or other method taught during program session;Compliance with respiratory medication;Demonstrates proper use of MDI's           Oxygen Re-Evaluation:   Oxygen Discharge (Final Oxygen Re-Evaluation):   Initial Exercise Prescription:  Initial Exercise Prescription - 10/03/20 1700      Date of Initial Exercise RX and Referring Provider   Date 10/03/20    Referring Provider Ottie Glazier MD      Oxygen   Oxygen Continuous    Liters 2      NuStep   Level 1    SPM 80    Minutes 15    METs 1      Arm Ergometer   Level 1    RPM 30    Minutes 15    METs 1  Biostep-RELP   Level 1    SPM 50    Minutes 15    METs 1      Prescription Details   Frequency (times per week) 2     Duration Progress to 30 minutes of continuous aerobic without signs/symptoms of physical distress      Intensity   THRR 40-80% of Max Heartrate 107-139    Ratings of Perceived Exertion 11-13    Perceived Dyspnea 0-4      Progression   Progression Continue to progress workloads to maintain intensity without signs/symptoms of physical distress.      Resistance Training   Training Prescription Yes    Weight 3 lb    Reps 10-15           Perform Capillary Blood Glucose checks as needed.  Exercise Prescription Changes:  Exercise Prescription Changes    Row Name 10/03/20 1700             Response to Exercise   Blood Pressure (Admit) 96/60       Blood Pressure (Exercise) 104/60       Blood Pressure (Exit) 100/62       Heart Rate (Admit) 76 bpm       Heart Rate (Exercise) 85 bpm       Heart Rate (Exit) 76 bpm       Oxygen Saturation (Admit) 97 %       Oxygen Saturation (Exercise) 94 %       Oxygen Saturation (Exit) 98 %       Rating of Perceived Exertion (Exercise) 11       Perceived Dyspnea (Exercise) 2       Symptoms SOB       Comments walk test results              Exercise Comments:   Exercise Goals and Review:  Exercise Goals    Row Name 10/03/20 1732             Exercise Goals   Increase Physical Activity Yes       Intervention Provide advice, education, support and counseling about physical activity/exercise needs.;Develop an individualized exercise prescription for aerobic and resistive training based on initial evaluation findings, risk stratification, comorbidities and participant's personal goals.       Expected Outcomes Short Term: Attend rehab on a regular basis to increase amount of physical activity.;Long Term: Add in home exercise to make exercise part of routine and to increase amount of physical activity.;Long Term: Exercising regularly at least 3-5 days a week.       Increase Strength and Stamina Yes       Intervention Provide advice,  education, support and counseling about physical activity/exercise needs.;Develop an individualized exercise prescription for aerobic and resistive training based on initial evaluation findings, risk stratification, comorbidities and participant's personal goals.       Expected Outcomes Short Term: Increase workloads from initial exercise prescription for resistance, speed, and METs.;Short Term: Perform resistance training exercises routinely during rehab and add in resistance training at home;Long Term: Improve cardiorespiratory fitness, muscular endurance and strength as measured by increased METs and functional capacity (6MWT)       Able to understand and use rate of perceived exertion (RPE) scale Yes       Intervention Provide education and explanation on how to use RPE scale       Expected Outcomes Short Term: Able to use RPE daily in rehab to express subjective intensity level;Long  Term:  Able to use RPE to guide intensity level when exercising independently       Able to understand and use Dyspnea scale Yes       Intervention Provide education and explanation on how to use Dyspnea scale       Expected Outcomes Short Term: Able to use Dyspnea scale daily in rehab to express subjective sense of shortness of breath during exertion;Long Term: Able to use Dyspnea scale to guide intensity level when exercising independently       Knowledge and understanding of Target Heart Rate Range (THRR) Yes       Intervention Provide education and explanation of THRR including how the numbers were predicted and where they are located for reference       Expected Outcomes Short Term: Able to state/look up THRR;Short Term: Able to use daily as guideline for intensity in rehab;Long Term: Able to use THRR to govern intensity when exercising independently       Able to check pulse independently Yes       Intervention Provide education and demonstration on how to check pulse in carotid and radial arteries.;Review the  importance of being able to check your own pulse for safety during independent exercise       Expected Outcomes Short Term: Able to explain why pulse checking is important during independent exercise;Long Term: Able to check pulse independently and accurately       Understanding of Exercise Prescription Yes       Intervention Provide education, explanation, and written materials on patient's individual exercise prescription       Expected Outcomes Short Term: Able to explain program exercise prescription;Long Term: Able to explain home exercise prescription to exercise independently              Exercise Goals Re-Evaluation :   Discharge Exercise Prescription (Final Exercise Prescription Changes):  Exercise Prescription Changes - 10/03/20 1700      Response to Exercise   Blood Pressure (Admit) 96/60    Blood Pressure (Exercise) 104/60    Blood Pressure (Exit) 100/62    Heart Rate (Admit) 76 bpm    Heart Rate (Exercise) 85 bpm    Heart Rate (Exit) 76 bpm    Oxygen Saturation (Admit) 97 %    Oxygen Saturation (Exercise) 94 %    Oxygen Saturation (Exit) 98 %    Rating of Perceived Exertion (Exercise) 11    Perceived Dyspnea (Exercise) 2    Symptoms SOB    Comments walk test results           Nutrition:  Target Goals: Understanding of nutrition guidelines, daily intake of sodium 1500mg , cholesterol 200mg , calories 30% from fat and 7% or less from saturated fats, daily to have 5 or more servings of fruits and vegetables.  Education: All About Nutrition: -Group instruction provided by verbal, written material, interactive activities, discussions, models, and posters to present general guidelines for heart healthy nutrition including fat, fiber, MyPlate, the role of sodium in heart healthy nutrition, utilization of the nutrition label, and utilization of this knowledge for meal planning. Follow up email sent as well. Written material given at graduation.   Biometrics:  Pre  Biometrics - 10/03/20 1728      Pre Biometrics   Height 6\' 1"  (1.854 m)   Verbal by patient; includes prosthetic leg   Weight 149 lb 9.6 oz (67.9 kg)   subtracted 43 lb from wheelchair and prosthetic leg   BMI (Calculated) 19.74  Nutrition Therapy Plan and Nutrition Goals:   Nutrition Assessments:  MEDIFICTS Score Key:  ?70 Need to make dietary changes   40-70 Heart Healthy Diet  ? 40 Therapeutic Level Cholesterol Diet  Flowsheet Row Pulmonary Rehab from 10/03/2020 in [facility] Cardiac and Pulmonary Rehab  Picture Your Plate Total Score on Admission 55     Picture Your Plate Scores:  <37 Unhealthy dietary pattern with much room for improvement.  41-50 Dietary pattern unlikely to meet recommendations for good health and room for improvement.  51-60 More healthful dietary pattern, with some room for improvement.   >60 Healthy dietary pattern, although there may be some specific behaviors that could be improved.   Nutrition Goals Re-Evaluation:   Nutrition Goals Discharge (Final Nutrition Goals Re-Evaluation):   Psychosocial: Target Goals: Acknowledge presence or absence of significant depression and/or stress, maximize coping skills, provide positive support system. Participant is able to verbalize types and ability to use techniques and skills needed for reducing stress and depression.   Education: Stress, Anxiety, and Depression - Group verbal and visual presentation to define topics covered.  Reviews how body is impacted by stress, anxiety, and depression.  Also discusses healthy ways to reduce stress and to treat/manage anxiety and depression.  Written material given at graduation.   Education: Sleep Hygiene -Provides group verbal and written instruction about how sleep can affect your health.  Define sleep hygiene, discuss sleep cycles and impact of sleep habits. Review good sleep hygiene tips.    Initial Review & Psychosocial Screening:  Initial Psych  Review & Screening - 10/03/20 1550      Initial Review   Current issues with Current Psychotropic Meds;Current Stress Concerns    Source of Stress Concerns Family    Comments Harolds kids are not a source of his mental support. He can look to his wife and sisters for support.      Family Dynamics   Good Support System? Yes      Barriers   Psychosocial barriers to participate in program The patient should benefit from training in stress management and relaxation.      Screening Interventions   Interventions To provide support and resources with identified psychosocial needs;Provide feedback about the scores to participant;Encouraged to exercise    Expected Outcomes Short Term goal: Utilizing psychosocial counselor, staff and physician to assist with identification of specific Stressors or current issues interfering with healing process. Setting desired goal for each stressor or current issue identified.;Long Term Goal: Stressors or current issues are controlled or eliminated.;Short Term goal: Identification and review with participant of any Quality of Life or Depression concerns found by scoring the questionnaire.;Long Term goal: The participant improves quality of Life and PHQ9 Scores as seen by post scores and/or verbalization of changes           Quality of Life Scores:  Scores of 19 and below usually indicate a poorer quality of life in these areas.  A difference of  2-3 points is a clinically meaningful difference.  A difference of 2-3 points in the total score of the Quality of Life Index has been associated with significant improvement in overall quality of life, self-image, physical symptoms, and general health in studies assessing change in quality of life.  PHQ-9: Recent Review Flowsheet Data    Depression screen [facility] 2/9 10/03/2020 08/06/2020 04/05/2020 02/01/2020 12/01/2019   Decreased Interest 1 0 0 0 0   Down, Depressed, Hopeless 0 0 0 0 0   PHQ - 2 Score  1 0 0 0 0   Altered  sleeping 0 - - - -   Tired, decreased energy 3 - - - -   Change in appetite 1 - - - -   Feeling bad or failure about yourself  1 - - - -   Trouble concentrating 3 - - - -   Moving slowly or fidgety/restless 0 - - - -   Suicidal thoughts 0 - - - -   PHQ-9 Score 9 - - - -   Difficult doing work/chores Very difficult - - - -     Interpretation of Total Score  Total Score Depression Severity:  1-4 = Minimal depression, 5-9 = Mild depression, 10-14 = Moderate depression, 15-19 = Moderately severe depression, 20-27 = Severe depression   Psychosocial Evaluation and Intervention:  Psychosocial Evaluation - 10/03/20 1553      Psychosocial Evaluation & Interventions   Interventions Encouraged to exercise with the program and follow exercise prescription;Relaxation education;Stress management education    Comments Har[place] kids are not a source of his mental support. He can look to his wife and sisters for support.    Expected Outcomes Short: Exercise regularly to support mental health and notify staff of any changes. Long: maintain mental health and well being through teaching of rehab or prescribed medications independently.    Continue Psychosocial Services  Follow up required by staff           Psychosocial Re-Evaluation:   Psychosocial Discharge (Final Psychosocial Re-Evaluation):   Education: Education Goals: Education classes will be provided on a weekly basis, covering required topics. Participant will state understanding/return demonstration of topics presented.  Learning Barriers/Preferences:  Learning Barriers/Preferences - 10/03/20 1549      Learning Barriers/Preferences   Learning Barriers None    Learning Preferences None           General Pulmonary Education Topics:  Infection Prevention: - Provides verbal and written material to individual with discussion of infection control including proper hand washing and proper equipment cleaning during exercise  session. Flowsheet Row Pulmonary Rehab from 10/03/2020 in [facility] Cardiac and Pulmonary Rehab  Date 10/03/20  Educator [facility]  Instruction Review Code 1- Verbalizes Understanding      Falls Prevention: - Provides verbal and written material to individual with discussion of falls prevention and safety. Flowsheet Row Pulmonary Rehab from 10/03/2020 in [facility] Cardiac and Pulmonary Rehab  Date 10/03/20  Educator [facility]  Instruction Review Code 1- Verbalizes Understanding      Chronic Lung Disease Review: - Group verbal instruction with posters, models, PowerPoint presentations and videos,  to review new updates, new respiratory medications, new advancements in procedures and treatments. Providing information on websites and "800" numbers for continued self-education. Includes information about supplement oxygen, available portable oxygen systems, continuous and intermittent flow rates, oxygen safety, concentrators, and Medicare reimbursement for oxygen. Explanation of Pulmonary Drugs, including class, frequency, complications, importance of spacers, rinsing mouth after steroid MDI's, and proper cleaning methods for nebulizers. Review of basic lung anatomy and physiology related to function, structure, and complications of lung disease. Review of risk factors. Discussion about methods for diagnosing sleep apnea and types of masks and machines for OSA. Includes a review of the use of types of environmental controls: home humidity, furnaces, filters, dust mite/pet prevention, HEPA vacuums. Discussion about weather changes, air quality and the benefits of nasal washing. Instruction on Warning signs, infection symptoms, calling MD promptly, preventive modes, and value of vaccinations. Review of effective airway clearance, coughing  and/or vibration techniques. Emphasizing that all should Create an Action Plan. Written material given at graduation.   AED/CPR: - Group verbal and written instruction with the use of models  to demonstrate the basic use of the AED with the basic ABC's of resuscitation.    Anatomy and Cardiac Procedures: - Group verbal and visual presentation and models provide information about basic cardiac anatomy and function. Reviews the testing methods done to diagnose heart disease and the outcomes of the test results. Describes the treatment choices: Medical Management, Angioplasty, or Coronary Bypass Surgery for treating various heart conditions including Myocardial Infarction, Angina, Valve Disease, and Cardiac Arrhythmias.  Written material given at graduation.   Medication Safety: - Group verbal and visual instruction to review commonly prescribed medications for heart and lung disease. Reviews the medication, class of the drug, and side effects. Includes the steps to properly store meds and maintain the prescription regimen.  Written material given at graduation.   Other: -Provides group and verbal instruction on various topics (see comments)   Knowledge Questionnaire Score:  Knowledge Questionnaire Score - 10/03/20 1740      Knowledge Questionnaire Score   Pre Score 13/18: Oxygen, HR            Core Components/Risk Factors/Patient Goals at Admission:  Personal Goals and Risk Factors at Admission - 10/03/20 1549      Core Components/Risk Factors/Patient Goals on Admission    Weight Management Yes;Weight Maintenance   Patient reports maintain weight, despite BMI   Intervention Weight Management: Develop a combined nutrition and exercise program designed to reach desired caloric intake, while maintaining appropriate intake of nutrient and fiber, sodium and fats, and appropriate energy expenditure required for the weight goal.;Weight Management: Provide education and appropriate resources to help participant work on and attain dietary goals.;Weight Management/Obesity: Establish reasonable short term and long term weight goals.    Admit Weight 149 lb (67.6 kg)    Goal Weight:  Short Term 149 lb (67.6 kg)    Goal Weight: Long Term 149 lb (67.6 kg)    Expected Outcomes Short Term: Continue to assess and modify interventions until short term weight is achieved;Long Term: Adherence to nutrition and physical activity/exercise program aimed toward attainment of established weight goal;Weight Maintenance: Understanding of the daily nutrition guidelines, which includes 25-35% calories from fat, 7% or less cal from saturated fats, less than 200mg  cholesterol, less than 1.5gm of sodium, & 5 or more servings of fruits and vegetables daily;Understanding recommendations for meals to include 15-35% energy as protein, 25-35% energy from fat, 35-60% energy from carbohydrates, less than 200mg  of dietary cholesterol, 20-35 gm of total fiber daily;Understanding of distribution of calorie intake throughout the day with the consumption of 4-5 meals/snacks    Improve shortness of breath with ADL's Yes    Intervention Provide education, individualized exercise plan and daily activity instruction to help decrease symptoms of SOB with activities of daily living.    Expected Outcomes Short Term: Improve cardiorespiratory fitness to achieve a reduction of symptoms when performing ADLs;Long Term: Be able to perform more ADLs without symptoms or delay the onset of symptoms    Heart Failure Yes    Intervention Provide a combined exercise and nutrition program that is supplemented with education, support and counseling about heart failure. Directed toward relieving symptoms such as shortness of breath, decreased exercise tolerance, and extremity edema.    Expected Outcomes Improve functional capacity of life;Short term: Attendance in program 2-3 days a week with increased exercise  capacity. Reported lower sodium intake. Reported increased fruit and vegetable intake. Reports medication compliance.;Short term: Daily weights obtained and reported for increase. Utilizing diuretic protocols set by physician.;Long  term: Adoption of self-care skills and reduction of barriers for early signs and symptoms recognition and intervention leading to self-care maintenance.    Hypertension Yes    Intervention Provide education on lifestyle modifcations including regular physical activity/exercise, weight management, moderate sodium restriction and increased consumption of fresh fruit, vegetables, and low fat dairy, alcohol moderation, and smoking cessation.;Monitor prescription use compliance.    Expected Outcomes Short Term: Continued assessment and intervention until BP is < 140/15mm HG in hypertensive participants. < 130/40mm HG in hypertensive participants with diabetes, heart failure or chronic kidney disease.;Long Term: Maintenance of blood pressure at goal levels.    Lipids Yes    Intervention Provide education and support for participant on nutrition & aerobic/resistive exercise along with prescribed medications to achieve LDL 70mg , HDL >40mg .    Expected Outcomes Short Term: Participant states understanding of desired cholesterol values and is compliant with medications prescribed. Participant is following exercise prescription and nutrition guidelines.;Long Term: Cholesterol controlled with medications as prescribed, with individualized exercise RX and with personalized nutrition plan. Value goals: LDL < 70mg , HDL > 40 mg.           Education:Diabetes - Individual verbal and written instruction to review signs/symptoms of diabetes, desired ranges of glucose level fasting, after meals and with exercise. Acknowledge that pre and post exercise glucose checks will be done for 3 sessions at entry of program.   Know Your Numbers and Heart Failure: - Group verbal and visual instruction to discuss disease risk factors for cardiac and pulmonary disease and treatment options.  Reviews associated critical values for Overweight/Obesity, Hypertension, Cholesterol, and Diabetes.  Discusses basics of heart failure:  signs/symptoms and treatments.  Introduces Heart Failure Zone chart for action plan for heart failure.  Written material given at graduation.   Core Components/Risk Factors/Patient Goals Review:    Core Components/Risk Factors/Patient Goals at Discharge (Final Review):    ITP Comments:  ITP Comments    Row Name 10/03/20 1705           ITP Comments Completed 6 minute step test and gym orientation. Initial ITP created and sent for review to Dr. Emily Filbert, Medical Director.              Comments: Initial ITP

## 2020-10-03 NOTE — Patient Instructions (Signed)
Patient Instructions  Patient Details  Name: Tyler [name] MRN: [id number] Date of Birth: 07-25-1955 Referring Provider:  Ottie Glazier, MD  Below are your personal goals for exercise, nutrition, and risk factors. Our goal is to help you stay on track towards obtaining and maintaining these goals. We will be discussing your progress on these goals with you throughout the program.  Initial Exercise Prescription:  Initial Exercise Prescription - 10/03/20 1700      Date of Initial Exercise RX and Referring Provider   Date 10/03/20    Referring Provider Ottie Glazier MD      Oxygen   Oxygen Continuous    Liters 2      NuStep   Level 1    SPM 80    Minutes 15    METs 1      Arm Ergometer   Level 1    RPM 30    Minutes 15    METs 1      Biostep-RELP   Level 1    SPM 50    Minutes 15    METs 1      Prescription Details   Frequency (times per week) 2    Duration Progress to 30 minutes of continuous aerobic without signs/symptoms of physical distress      Intensity   THRR 40-80% of Max Heartrate 107-139    Ratings of Perceived Exertion 11-13    Perceived Dyspnea 0-4      Progression   Progression Continue to progress workloads to maintain intensity without signs/symptoms of physical distress.      Resistance Training   Training Prescription Yes    Weight 3 lb    Reps 10-15           Exercise Goals: Frequency: Be able to perform aerobic exercise two to three times per week in program working toward 2-5 days per week of home exercise.  Intensity: Work with a perceived exertion of 11 (fairly light) - 15 (hard) while following your exercise prescription.  We will make changes to your prescription with you as you progress through the program.   Duration: Be able to do 30 to 45 minutes of continuous aerobic exercise in addition to a 5 minute warm-up and a 5 minute cool-down routine.   Nutrition Goals: Your personal nutrition goals will be established when you  do your nutrition analysis with the dietician.  The following are general nutrition guidelines to follow: Cholesterol < 200mg /day Sodium < 1500mg /day Fiber: Men over 50 yrs - 30 grams per day  Personal Goals:  Personal Goals and Risk Factors at Admission - 10/03/20 1549      Core Components/Risk Factors/Patient Goals on Admission    Weight Management Yes;Weight Maintenance   Patient reports maintain weight, despite BMI   Intervention Weight Management: Develop a combined nutrition and exercise program designed to reach desired caloric intake, while maintaining appropriate intake of nutrient and fiber, sodium and fats, and appropriate energy expenditure required for the weight goal.;Weight Management: Provide education and appropriate resources to help participant work on and attain dietary goals.;Weight Management/Obesity: Establish reasonable short term and long term weight goals.    Admit Weight 149 lb (67.6 kg)    Goal Weight: Short Term 149 lb (67.6 kg)    Goal Weight: Long Term 149 lb (67.6 kg)    Expected Outcomes Short Term: Continue to assess and modify interventions until short term weight is achieved;Long Term: Adherence to nutrition and physical activity/exercise program aimed toward  attainment of established weight goal;Weight Maintenance: Understanding of the daily nutrition guidelines, which includes 25-35% calories from fat, 7% or less cal from saturated fats, less than 200mg  cholesterol, less than 1.5gm of sodium, & 5 or more servings of fruits and vegetables daily;Understanding recommendations for meals to include 15-35% energy as protein, 25-35% energy from fat, 35-60% energy from carbohydrates, less than 200mg  of dietary cholesterol, 20-35 gm of total fiber daily;Understanding of distribution of calorie intake throughout the day with the consumption of 4-5 meals/snacks    Improve shortness of breath with ADL's Yes    Intervention Provide education, individualized exercise plan  and daily activity instruction to help decrease symptoms of SOB with activities of daily living.    Expected Outcomes Short Term: Improve cardiorespiratory fitness to achieve a reduction of symptoms when performing ADLs;Long Term: Be able to perform more ADLs without symptoms or delay the onset of symptoms    Heart Failure Yes    Intervention Provide a combined exercise and nutrition program that is supplemented with education, support and counseling about heart failure. Directed toward relieving symptoms such as shortness of breath, decreased exercise tolerance, and extremity edema.    Expected Outcomes Improve functional capacity of life;Short term: Attendance in program 2-3 days a week with increased exercise capacity. Reported lower sodium intake. Reported increased fruit and vegetable intake. Reports medication compliance.;Short term: Daily weights obtained and reported for increase. Utilizing diuretic protocols set by physician.;Long term: Adoption of self-care skills and reduction of barriers for early signs and symptoms recognition and intervention leading to self-care maintenance.    Hypertension Yes    Intervention Provide education on lifestyle modifcations including regular physical activity/exercise, weight management, moderate sodium restriction and increased consumption of fresh fruit, vegetables, and low fat dairy, alcohol moderation, and smoking cessation.;Monitor prescription use compliance.    Expected Outcomes Short Term: Continued assessment and intervention until BP is < 140/81mm HG in hypertensive participants. < 130/16mm HG in hypertensive participants with diabetes, heart failure or chronic kidney disease.;Long Term: Maintenance of blood pressure at goal levels.    Lipids Yes    Intervention Provide education and support for participant on nutrition & aerobic/resistive exercise along with prescribed medications to achieve LDL 70mg , HDL >40mg .    Expected Outcomes Short Term:  Participant states understanding of desired cholesterol values and is compliant with medications prescribed. Participant is following exercise prescription and nutrition guidelines.;Long Term: Cholesterol controlled with medications as prescribed, with individualized exercise RX and with personalized nutrition plan. Value goals: LDL < 70mg , HDL > 40 mg.           Tobacco Use Initial Evaluation: Social History   Tobacco Use  Smoking Status Former Smoker  . Packs/day: 1.00  . Years: 42.00  . Pack years: 42.00  . Types: Cigarettes  . Quit date: 09/23/2013  . Years since quitting: 7.0  Smokeless Tobacco Never Used    Exercise Goals and Review:  Exercise Goals    Row Name 10/03/20 1732             Exercise Goals   Increase Physical Activity Yes       Intervention Provide advice, education, support and counseling about physical activity/exercise needs.;Develop an individualized exercise prescription for aerobic and resistive training based on initial evaluation findings, risk stratification, comorbidities and participant's personal goals.       Expected Outcomes Short Term: Attend rehab on a regular basis to increase amount of physical activity.;Long Term: Add in home exercise to make exercise  part of routine and to increase amount of physical activity.;Long Term: Exercising regularly at least 3-5 days a week.       Increase Strength and Stamina Yes       Intervention Provide advice, education, support and counseling about physical activity/exercise needs.;Develop an individualized exercise prescription for aerobic and resistive training based on initial evaluation findings, risk stratification, comorbidities and participant's personal goals.       Expected Outcomes Short Term: Increase workloads from initial exercise prescription for resistance, speed, and METs.;Short Term: Perform resistance training exercises routinely during rehab and add in resistance training at home;Long Term:  Improve cardiorespiratory fitness, muscular endurance and strength as measured by increased METs and functional capacity (6MWT)       Able to understand and use rate of perceived exertion (RPE) scale Yes       Intervention Provide education and explanation on how to use RPE scale       Expected Outcomes Short Term: Able to use RPE daily in rehab to express subjective intensity level;Long Term:  Able to use RPE to guide intensity level when exercising independently       Able to understand and use Dyspnea scale Yes       Intervention Provide education and explanation on how to use Dyspnea scale       Expected Outcomes Short Term: Able to use Dyspnea scale daily in rehab to express subjective sense of shortness of breath during exertion;Long Term: Able to use Dyspnea scale to guide intensity level when exercising independently       Knowledge and understanding of Target Heart Rate Range (THRR) Yes       Intervention Provide education and explanation of THRR including how the numbers were predicted and where they are located for reference       Expected Outcomes Short Term: Able to state/look up THRR;Short Term: Able to use daily as guideline for intensity in rehab;Long Term: Able to use THRR to govern intensity when exercising independently       Able to check pulse independently Yes       Intervention Provide education and demonstration on how to check pulse in carotid and radial arteries.;Review the importance of being able to check your own pulse for safety during independent exercise       Expected Outcomes Short Term: Able to explain why pulse checking is important during independent exercise;Long Term: Able to check pulse independently and accurately       Understanding of Exercise Prescription Yes       Intervention Provide education, explanation, and written materials on patient's individual exercise prescription       Expected Outcomes Short Term: Able to explain program exercise  prescription;Long Term: Able to explain home exercise prescription to exercise independently              Copy of goals given to participant.

## 2020-10-08 ENCOUNTER — Ambulatory Visit (INDEPENDENT_AMBULATORY_CARE_PROVIDER_SITE_OTHER): Payer: Medicare HMO | Admitting: Nurse Practitioner

## 2020-10-09 ENCOUNTER — Encounter: Payer: Self-pay | Admitting: *Deleted

## 2020-10-09 ENCOUNTER — Ambulatory Visit (INDEPENDENT_AMBULATORY_CARE_PROVIDER_SITE_OTHER): Payer: Medicare HMO | Admitting: Nurse Practitioner

## 2020-10-09 ENCOUNTER — Encounter (INDEPENDENT_AMBULATORY_CARE_PROVIDER_SITE_OTHER): Payer: Self-pay | Admitting: Nurse Practitioner

## 2020-10-09 ENCOUNTER — Other Ambulatory Visit: Payer: Self-pay

## 2020-10-09 VITALS — BP 124/69 | HR 80 | Resp 16

## 2020-10-09 DIAGNOSIS — E782 Mixed hyperlipidemia: Secondary | ICD-10-CM

## 2020-10-09 DIAGNOSIS — Z89512 Acquired absence of left leg below knee: Secondary | ICD-10-CM

## 2020-10-09 DIAGNOSIS — I10 Essential (primary) hypertension: Secondary | ICD-10-CM

## 2020-10-09 DIAGNOSIS — J449 Chronic obstructive pulmonary disease, unspecified: Secondary | ICD-10-CM

## 2020-10-09 NOTE — [place] Notes (Signed)
Pulmonary Individual Treatment Plan  Patient Details  Name: Tyler [name] MRN: [id number] Date of Birth: 07/22/1955 Referring Provider:   Flowsheet Row Pulmonary Rehab from 10/03/2020 in [facility] Cardiac and Pulmonary Rehab  Referring Provider Ottie Glazier MD      Initial Encounter Date:  Flowsheet Row Pulmonary Rehab from 10/03/2020 in [facility] Cardiac and Pulmonary Rehab  Date 10/03/20      Visit Diagnosis: Chronic obstructive pulmonary disease, unspecified COPD type ([place])  Patient's Home Medications on Admission:  Current Outpatient Medications:  .  acetaminophen (TYLENOL) 500 MG tablet, Take 1,000 mg by mouth daily as needed for moderate pain or headache., Disp: , Rfl:  .  ALPRAZolam (XANAX) 1 MG tablet, Take 1 mg by mouth 2 (two) times daily as needed for anxiety or sleep. , Disp: , Rfl:  .  aspirin EC 81 MG tablet, Take 1 tablet (81 mg total) by mouth daily. (Patient taking differently: Take 81 mg by mouth daily after supper.), Disp: 150 tablet, Rfl: 2 .  azithromycin (ZITHROMAX) 250 MG tablet, Take 250 mg by mouth daily. Monday, Wednesday, Friday for 90 days, Disp: , Rfl:  .  budesonide (PULMICORT) 0.25 MG/2ML nebulizer solution, Inhale into the lungs., Disp: , Rfl:  .  citalopram (CELEXA) 10 MG tablet, Take 10 mg by mouth daily., Disp: , Rfl:  .  docusate sodium (COLACE) 100 MG capsule, Take 100 mg by mouth daily as needed (for constipation.). , Disp: , Rfl:  .  Dupilumab (DUPIXENT) 300 MG/2ML SOPN, Inject 300 mg into the skin every 14 (fourteen) days. Inject 2 mLs (300mg )., Disp: , Rfl:  .  ENTRESTO 24-26 MG, Take 1 tablet by mouth 2 (two) times daily., Disp: 60 tablet, Rfl: 0 .  EPINEPHrine 0.3 mg/0.3 mL IJ SOAJ injection, Inject into the muscle., Disp: , Rfl:  .  fluticasone (FLONASE) 50 MCG/ACT nasal spray, Place 2 sprays into both nostrils daily., Disp: , Rfl:  .  fluticasone-salmeterol (ADVAIR HFA) 115-21 MCG/ACT inhaler, USE 2 INHALATIONS ORALLY EVERY 12 HOURS (Patient  taking differently: Inhale 2 puffs into the lungs every 12 (twelve) hours.), Disp: 36 Inhaler, Rfl: 5 .  furosemide (LASIX) 20 MG tablet, Take 2 tablets (40 mg total) by mouth daily. (Patient taking differently: Take 20 mg by mouth daily.), Disp: 60 tablet, Rfl: 0 .  furosemide (LASIX) 20 MG tablet, Take 20 mg by mouth. (Patient not taking: Reported on 10/03/2020), Disp: , Rfl:  .  gabapentin (NEURONTIN) 100 MG capsule, Take 300 mg by mouth in the morning, at noon, and at bedtime., Disp: , Rfl:  .  Gauze Pads & Dressings (RESTORE FOAM DRESSING) 6"X8" PADS, by Does not apply route. (Patient not taking: Reported on 10/03/2020), Disp: , Rfl:  .  guaiFENesin (MUCINEX) 600 MG 12 hr tablet, Take 600 mg by mouth every evening. , Disp: , Rfl:  .  ipratropium (ATROVENT) 0.03 % nasal spray, Place 2 sprays into both nostrils 2 (two) times daily. , Disp: , Rfl:  .  Ipratropium-Albuterol (COMBIVENT) 20-100 MCG/ACT AERS respimat, Inhale 1 puff into the lungs every 4 (four) hours., Disp: , Rfl:  .  ipratropium-albuterol (DUONEB) 0.5-2.5 (3) MG/3ML SOLN, Inhale into the lungs., Disp: , Rfl:  .  JARDIANCE 10 MG TABS tablet, Take 10 mg by mouth daily with breakfast., Disp: , Rfl:  .  levalbuterol (XOPENEX) 1.25 MG/3ML nebulizer solution, Take 1.25 mg (3 mLs total) by nebulization every 4 (four) hours. (Patient not taking: Reported on 10/03/2020), Disp: 72 mL, Rfl: 12 .  loratadine (CLARITIN) 10 MG tablet, Take 10 mg by mouth at bedtime. (Patient not taking: Reported on 10/03/2020), Disp: , Rfl:  .  lovastatin (MEVACOR) 20 MG tablet, Take 20 mg by mouth at bedtime. (Patient not taking: Reported on 10/03/2020), Disp: , Rfl:  .  lovastatin (MEVACOR) 20 MG tablet, Take 1 tablet by mouth daily., Disp: , Rfl:  .  metoprolol succinate (TOPROL-XL) 25 MG 24 hr tablet, Take 25 mg by mouth daily., Disp: , Rfl:  .  montelukast (SINGULAIR) 10 MG tablet, Take 10 mg by mouth at bedtime., Disp: , Rfl:  .  mupirocin ointment (BACTROBAN) 2  %, Apply 1 application topically daily. (Patient not taking: Reported on 10/03/2020), Disp: 22 g, Rfl: 0 .  nitroGLYCERIN (NITROSTAT) 0.4 MG SL tablet, Place 0.4 mg under the tongue every 5 (five) minutes as needed for chest pain., Disp: , Rfl:  .  oxyCODONE-acetaminophen (PERCOCET) 7.5-325 MG tablet, Take 1-2 tablets by mouth every 6 (six) hours as needed for moderate pain., Disp: 50 tablet, Rfl: 0 .  oxyCODONE-acetaminophen (PERCOCET) 7.5-325 MG tablet, Take by mouth. (Patient not taking: Reported on 10/03/2020), Disp: , Rfl:  .  pantoprazole (PROTONIX) 40 MG tablet, Take 40 mg by mouth daily before breakfast. , Disp: , Rfl:  .  predniSONE (DELTASONE) 20 MG tablet, Take 20 mg by mouth daily., Disp: , Rfl:  .  Respiratory Therapy Supplies (FLUTTER) DEVI, Use as directed., Disp: 1 each, Rfl: 0 .  roflumilast (DALIRESP) 500 MCG TABS tablet, Take 250 mcg by mouth daily. (Patient not taking: Reported on 10/03/2020), Disp: , Rfl:  .  sildenafil (VIAGRA) 25 MG tablet, Take by mouth., Disp: , Rfl:  .  SPIRIVA RESPIMAT 1.25 MCG/ACT AERS, INHALE 2 PUFFS BY MOUTH INTO THE LUNGS DAILY (Patient not taking: No sig reported), Disp: 4 g, Rfl: 6 .  tamsulosin (FLOMAX) 0.4 MG CAPS capsule, Take 0.4 mg by mouth daily after supper. , Disp: , Rfl: 11 .  vitamin B-12 (CYANOCOBALAMIN) 1000 MCG tablet, Take 1,000 mcg by mouth daily., Disp: , Rfl:  .  warfarin (COUMADIN) 5 MG tablet, Take 5 mg by mouth daily., Disp: , Rfl:  .  Wound Dressings (RESTORE WOUND CARE DRESSING) PADS, Apply 1 each topically daily.  (Patient not taking: Reported on 10/03/2020), Disp: , Rfl:   Past Medical History: Past Medical History:  Diagnosis Date  . AICD (automatic cardioverter/defibrillator) present   . Anxiety   . Arthritis   . Atherosclerosis of artery of extremity with ulceration ([place]) 09/2019   left foot s/p toe amp requiring debridement and futher toe amputations.  . Atrial fibrillation ([place])   . Cervical spinal stenosis    with  neuropathy  . CHF (congestive heart failure) ([place])   . Constipation   . COPD (chronic obstructive pulmonary disease) ([place])   . Coronary artery disease   . Depression   . Dyspnea   . Dysrhythmia    atrial fibrillation  . Emphysema of lung ([place])   . Factor 5 Leiden mutation, heterozygous ([place])    on coumadin  . Factor V Leiden mutation ([place])   . GERD (gastroesophageal reflux disease)   . Hypertension   . Lung nodule seen on imaging study    being followed by dr. [name]. just watching it for last few years, without change  . Mitral valve insufficiency   . Moderate tricuspid insufficiency   . Myocardial infarction [facility]) 2004   stent placed, pacemaker implanted 2005  . Osteomyelitis ([place]) 10/2019  left foot  . Oxygen dependent    requires 2L nasal prong oxygen 24 hours a day  . Peripheral vascular disease ([place])   . Presence of permanent cardiac pacemaker 2005,2018  . PVD (peripheral vascular disease) ([place])   . Sleep apnea    waiting to have sleep study. Used bipap while hospitalized and said it was great for him.    Tobacco Use: Social History   Tobacco Use  Smoking Status Former Smoker  . Packs/day: 1.00  . Years: 42.00  . Pack years: 42.00  . Types: Cigarettes  . Quit date: 09/23/2013  . Years since quitting: 7.0  Smokeless Tobacco Never Used    Labs: Recent Chemical engineer    Labs for ITP Cardiac and Pulmonary Rehab Latest Ref Rng & Units 10/13/2016 10/16/2016 05/05/2017 06/22/2017 10/25/2019   PHART 7.350 - 7.450 - 7.38 7.43 - -   PCO2ART 32.0 - 48.0 mmHg - 46 38 - -   HCO3 20.0 - 28.0 mmol/L 25.2 27.2 25.2 29.4(H) 31.0(H)   ACIDBASEDEF 0.0 - 2.0 mmol/L 4.4(H) - - - -   O2SAT % - 95.9 97.4 - 72.1       Pulmonary Assessment Scores:  Pulmonary Assessment Scores    Row Name 10/03/20 1708         ADL UCSD   ADL Phase Entry     SOB Score total 66     Rest 0     Walk 4     Stairs 5     Bath 2     Dress 1     Shop 4           CAT Score   CAT Score 21            mMRC Score   mMRC Score 4            UCSD: Self-administered rating of dyspnea associated with activities of daily living (ADLs) 6-point scale (0 = "not at all" to 5 = "maximal or unable to do because of breathlessness")  Scoring Scores range from 0 to 120.  Minimally important difference is 5 units  CAT: CAT can identify the health impairment of COPD patients and is better correlated with disease progression.  CAT has a scoring range of zero to 40. The CAT score is classified into four groups of low (less than 10), medium (10 - 20), high (21-30) and very high (31-40) based on the impact level of disease on health status. A CAT score over 10 suggests significant symptoms.  A worsening CAT score could be explained by an exacerbation, poor medication adherence, poor inhaler technique, or progression of COPD or comorbid conditions.  CAT MCID is 2 points  mMRC: mMRC (Modified Medical Research Council) Dyspnea Scale is used to assess the degree of baseline functional disability in patients of respiratory disease due to dyspnea. No minimal important difference is established. A decrease in score of 1 point or greater is considered a positive change.   Pulmonary Function Assessment:  Pulmonary Function Assessment - 10/03/20 1548      Pulmonary Function Tests   FVC% 73.1 %    FEV1% 30.6 %    FEV1/FVC Ratio 32      Post Bronchodilator Spirometry Results   FVC% --    FEV1% --    FEV1/FVC Ratio --      Breath   Bilateral Breath Sounds Clear;Decreased    Shortness of Breath Yes;Fear of Shortness of Breath;Panic with Shortness of  Breath;Limiting activity           Exercise Target Goals: Exercise Program Goal: Individual exercise prescription set using results from initial 6 min walk test and THRR while considering  patient's activity barriers and safety.   Exercise Prescription Goal: Initial exercise prescription builds to 30-45 minutes a day of aerobic activity, 2-3 days  per week.  Home exercise guidelines will be given to patient during program as part of exercise prescription that the participant will acknowledge.  Education: Aerobic Exercise: - Group verbal and visual presentation on the components of exercise prescription. Introduces F.I.T.T principle from ACSM for exercise prescriptions.  Reviews F.I.T.T. principles of aerobic exercise including progression. Written material given at graduation.   Education: Resistance Exercise: - Group verbal and visual presentation on the components of exercise prescription. Introduces F.I.T.T principle from ACSM for exercise prescriptions  Reviews F.I.T.T. principles of resistance exercise including progression. Written material given at graduation.    Education: Exercise & Equipment Safety: - Individual verbal instruction and demonstration of equipment use and safety with use of the equipment. Flowsheet Row Pulmonary Rehab from 10/03/2020 in [facility] Cardiac and Pulmonary Rehab  Date 10/03/20  Educator [facility]  Instruction Review Code 1- Verbalizes Understanding      Education: Exercise Physiology & General Exercise Guidelines: - Group verbal and written instruction with models to review the exercise physiology of the cardiovascular system and associated critical values. Provides general exercise guidelines with specific guidelines to those with heart or lung disease.    Education: Flexibility, Balance, Mind/Body Relaxation: - Group verbal and visual presentation with interactive activity on the components of exercise prescription. Introduces F.I.T.T principle from ACSM for exercise prescriptions. Reviews F.I.T.T. principles of flexibility and balance exercise training including progression. Also discusses the mind body connection.  Reviews various relaxation techniques to help reduce and manage stress (i.e. Deep breathing, progressive muscle relaxation, and visualization). Balance handout provided to take home. Written  material given at graduation.   Activity Barriers & Risk Stratification:  Activity Barriers & Cardiac Risk Stratification - 10/03/20 1729      Activity Barriers & Cardiac Risk Stratification   Activity Barriers Shortness of Breath;Assistive Device;Decreased Ventricular Function;Neck/Spine Problems;Muscular Weakness;Deconditioning;Balance Concerns;Other (comment)    Comments Double amputee/ prosthetic leg           6 Minute Walk:  6 Minute Walk    Row Name 10/03/20 1730         6 Minute Walk   Phase Initial     Distance 246 feet  Step test (total # of steps); wheelchair bound     Walk Time 5.2 minutes     # of Rest Breaks 1     MPH --  N/A     METS 1.08     RPE 11     Perceived Dyspnea  2     VO2 Peak 3.8     Symptoms Yes (comment)     Comments SOB     Resting HR 76 bpm     Resting BP 96/60     Resting Oxygen Saturation  97 %     Exercise Oxygen Saturation  during 6 min walk 94 %     Max Ex. HR 85 bpm     Max Ex. BP 104/60     2 Minute Post BP 100/62           Interval HR   1 Minute HR 81     2 Minute HR 82  3 Minute HR 85     4 Minute HR 83     5 Minute HR 82     6 Minute HR 80     2 Minute Post HR 76     Interval Heart Rate? Yes           Interval Oxygen   Interval Oxygen? Yes     Baseline Oxygen Saturation % 97 %     1 Minute Oxygen Saturation % 97 %     1 Minute Liters of Oxygen 2 L     2 Minute Oxygen Saturation % 98 %     2 Minute Liters of Oxygen 2 L     3 Minute Oxygen Saturation % 95 %     3 Minute Liters of Oxygen 2 L     4 Minute Oxygen Saturation % 94 %     4 Minute Liters of Oxygen 2 L     5 Minute Oxygen Saturation % 96 %     5 Minute Liters of Oxygen 2 L     6 Minute Oxygen Saturation % 97 %     6 Minute Liters of Oxygen 2 L     2 Minute Post Oxygen Saturation % 98 %     2 Minute Post Liters of Oxygen 2 L           Oxygen Initial Assessment:  Oxygen Initial Assessment - 10/03/20 1708      Home Oxygen   Home Oxygen Device  Home Concentrator;E-Tanks    Sleep Oxygen Prescription Continuous;BiPAP;CPAP    Liters per minute 2    Home Exercise Oxygen Prescription Continuous    Liters per minute 2    Home Resting Oxygen Prescription Continuous    Liters per minute 2    Compliance with Home Oxygen Use Yes      Initial 6 min Walk   Oxygen Used Continuous;E-Tanks    Liters per minute 2      Program Oxygen Prescription   Program Oxygen Prescription Continuous;E-Tanks    Liters per minute 2      Intervention   Short Term Goals To learn and exhibit compliance with exercise, home and travel O2 prescription;To learn and understand importance of maintaining oxygen saturations>88%;To learn and understand importance of monitoring SPO2 with pulse oximeter and demonstrate accurate use of the pulse oximeter.;To learn and demonstrate proper pursed lip breathing techniques or other breathing techniques.;To learn and demonstrate proper use of respiratory medications    Long  Term Goals Exhibits compliance with exercise, home and travel O2 prescription;Verbalizes importance of monitoring SPO2 with pulse oximeter and return demonstration;Maintenance of O2 saturations>88%;Exhibits proper breathing techniques, such as pursed lip breathing or other method taught during program session;Compliance with respiratory medication;Demonstrates proper use of MDI's           Oxygen Re-Evaluation:   Oxygen Discharge (Final Oxygen Re-Evaluation):   Initial Exercise Prescription:  Initial Exercise Prescription - 10/03/20 1700      Date of Initial Exercise RX and Referring Provider   Date 10/03/20    Referring Provider Ottie Glazier MD      Oxygen   Oxygen Continuous    Liters 2      NuStep   Level 1    SPM 80    Minutes 15    METs 1      Arm Ergometer   Level 1    RPM 30    Minutes 15    METs 1  Biostep-RELP   Level 1    SPM 50    Minutes 15    METs 1      Prescription Details   Frequency (times per week) 2     Duration Progress to 30 minutes of continuous aerobic without signs/symptoms of physical distress      Intensity   THRR 40-80% of Max Heartrate 107-139    Ratings of Perceived Exertion 11-13    Perceived Dyspnea 0-4      Progression   Progression Continue to progress workloads to maintain intensity without signs/symptoms of physical distress.      Resistance Training   Training Prescription Yes    Weight 3 lb    Reps 10-15           Perform Capillary Blood Glucose checks as needed.  Exercise Prescription Changes:  Exercise Prescription Changes    Row Name 10/03/20 1700             Response to Exercise   Blood Pressure (Admit) 96/60       Blood Pressure (Exercise) 104/60       Blood Pressure (Exit) 100/62       Heart Rate (Admit) 76 bpm       Heart Rate (Exercise) 85 bpm       Heart Rate (Exit) 76 bpm       Oxygen Saturation (Admit) 97 %       Oxygen Saturation (Exercise) 94 %       Oxygen Saturation (Exit) 98 %       Rating of Perceived Exertion (Exercise) 11       Perceived Dyspnea (Exercise) 2       Symptoms SOB       Comments walk test results              Exercise Comments:   Exercise Goals and Review:  Exercise Goals    Row Name 10/03/20 1732             Exercise Goals   Increase Physical Activity Yes       Intervention Provide advice, education, support and counseling about physical activity/exercise needs.;Develop an individualized exercise prescription for aerobic and resistive training based on initial evaluation findings, risk stratification, comorbidities and participant's personal goals.       Expected Outcomes Short Term: Attend rehab on a regular basis to increase amount of physical activity.;Long Term: Add in home exercise to make exercise part of routine and to increase amount of physical activity.;Long Term: Exercising regularly at least 3-5 days a week.       Increase Strength and Stamina Yes       Intervention Provide advice,  education, support and counseling about physical activity/exercise needs.;Develop an individualized exercise prescription for aerobic and resistive training based on initial evaluation findings, risk stratification, comorbidities and participant's personal goals.       Expected Outcomes Short Term: Increase workloads from initial exercise prescription for resistance, speed, and METs.;Short Term: Perform resistance training exercises routinely during rehab and add in resistance training at home;Long Term: Improve cardiorespiratory fitness, muscular endurance and strength as measured by increased METs and functional capacity (6MWT)       Able to understand and use rate of perceived exertion (RPE) scale Yes       Intervention Provide education and explanation on how to use RPE scale       Expected Outcomes Short Term: Able to use RPE daily in rehab to express subjective intensity level;Long  Term:  Able to use RPE to guide intensity level when exercising independently       Able to understand and use Dyspnea scale Yes       Intervention Provide education and explanation on how to use Dyspnea scale       Expected Outcomes Short Term: Able to use Dyspnea scale daily in rehab to express subjective sense of shortness of breath during exertion;Long Term: Able to use Dyspnea scale to guide intensity level when exercising independently       Knowledge and understanding of Target Heart Rate Range (THRR) Yes       Intervention Provide education and explanation of THRR including how the numbers were predicted and where they are located for reference       Expected Outcomes Short Term: Able to state/look up THRR;Short Term: Able to use daily as guideline for intensity in rehab;Long Term: Able to use THRR to govern intensity when exercising independently       Able to check pulse independently Yes       Intervention Provide education and demonstration on how to check pulse in carotid and radial arteries.;Review the  importance of being able to check your own pulse for safety during independent exercise       Expected Outcomes Short Term: Able to explain why pulse checking is important during independent exercise;Long Term: Able to check pulse independently and accurately       Understanding of Exercise Prescription Yes       Intervention Provide education, explanation, and written materials on patient's individual exercise prescription       Expected Outcomes Short Term: Able to explain program exercise prescription;Long Term: Able to explain home exercise prescription to exercise independently              Exercise Goals Re-Evaluation :   Discharge Exercise Prescription (Final Exercise Prescription Changes):  Exercise Prescription Changes - 10/03/20 1700      Response to Exercise   Blood Pressure (Admit) 96/60    Blood Pressure (Exercise) 104/60    Blood Pressure (Exit) 100/62    Heart Rate (Admit) 76 bpm    Heart Rate (Exercise) 85 bpm    Heart Rate (Exit) 76 bpm    Oxygen Saturation (Admit) 97 %    Oxygen Saturation (Exercise) 94 %    Oxygen Saturation (Exit) 98 %    Rating of Perceived Exertion (Exercise) 11    Perceived Dyspnea (Exercise) 2    Symptoms SOB    Comments walk test results           Nutrition:  Target Goals: Understanding of nutrition guidelines, daily intake of sodium 1500mg , cholesterol 200mg , calories 30% from fat and 7% or less from saturated fats, daily to have 5 or more servings of fruits and vegetables.  Education: All About Nutrition: -Group instruction provided by verbal, written material, interactive activities, discussions, models, and posters to present general guidelines for heart healthy nutrition including fat, fiber, MyPlate, the role of sodium in heart healthy nutrition, utilization of the nutrition label, and utilization of this knowledge for meal planning. Follow up email sent as well. Written material given at graduation.   Biometrics:  Pre  Biometrics - 10/03/20 1728      Pre Biometrics   Height 6\' 1"  (1.854 m)   Verbal by patient; includes prosthetic leg   Weight 149 lb 9.6 oz (67.9 kg)   subtracted 43 lb from wheelchair and prosthetic leg   BMI (Calculated) 19.74  Nutrition Therapy Plan and Nutrition Goals:   Nutrition Assessments:  MEDIFICTS Score Key:  ?70 Need to make dietary changes   40-70 Heart Healthy Diet  ? 40 Therapeutic Level Cholesterol Diet  Flowsheet Row Pulmonary Rehab from 10/03/2020 in [facility] Cardiac and Pulmonary Rehab  Picture Your Plate Total Score on Admission 55     Picture Your Plate Scores:  <52 Unhealthy dietary pattern with much room for improvement.  41-50 Dietary pattern unlikely to meet recommendations for good health and room for improvement.  51-60 More healthful dietary pattern, with some room for improvement.   >60 Healthy dietary pattern, although there may be some specific behaviors that could be improved.   Nutrition Goals Re-Evaluation:   Nutrition Goals Discharge (Final Nutrition Goals Re-Evaluation):   Psychosocial: Target Goals: Acknowledge presence or absence of significant depression and/or stress, maximize coping skills, provide positive support system. Participant is able to verbalize types and ability to use techniques and skills needed for reducing stress and depression.   Education: Stress, Anxiety, and Depression - Group verbal and visual presentation to define topics covered.  Reviews how body is impacted by stress, anxiety, and depression.  Also discusses healthy ways to reduce stress and to treat/manage anxiety and depression.  Written material given at graduation.   Education: Sleep Hygiene -Provides group verbal and written instruction about how sleep can affect your health.  Define sleep hygiene, discuss sleep cycles and impact of sleep habits. Review good sleep hygiene tips.    Initial Review & Psychosocial Screening:  Initial Psych  Review & Screening - 10/03/20 1550      Initial Review   Current issues with Current Psychotropic Meds;Current Stress Concerns    Source of Stress Concerns Family    Comments Harolds kids are not a source of his mental support. He can look to his wife and sisters for support.      Family Dynamics   Good Support System? Yes      Barriers   Psychosocial barriers to participate in program The patient should benefit from training in stress management and relaxation.      Screening Interventions   Interventions To provide support and resources with identified psychosocial needs;Provide feedback about the scores to participant;Encouraged to exercise    Expected Outcomes Short Term goal: Utilizing psychosocial counselor, staff and physician to assist with identification of specific Stressors or current issues interfering with healing process. Setting desired goal for each stressor or current issue identified.;Long Term Goal: Stressors or current issues are controlled or eliminated.;Short Term goal: Identification and review with participant of any Quality of Life or Depression concerns found by scoring the questionnaire.;Long Term goal: The participant improves quality of Life and PHQ9 Scores as seen by post scores and/or verbalization of changes           Quality of Life Scores:  Scores of 19 and below usually indicate a poorer quality of life in these areas.  A difference of  2-3 points is a clinically meaningful difference.  A difference of 2-3 points in the total score of the Quality of Life Index has been associated with significant improvement in overall quality of life, self-image, physical symptoms, and general health in studies assessing change in quality of life.  PHQ-9: Recent Review Flowsheet Data    Depression screen [facility] 2/9 10/03/2020 08/06/2020 04/05/2020 02/01/2020 12/01/2019   Decreased Interest 1 0 0 0 0   Down, Depressed, Hopeless 0 0 0 0 0   PHQ - 2 Score  1 0 0 0 0   Altered  sleeping 0 - - - -   Tired, decreased energy 3 - - - -   Change in appetite 1 - - - -   Feeling bad or failure about yourself  1 - - - -   Trouble concentrating 3 - - - -   Moving slowly or fidgety/restless 0 - - - -   Suicidal thoughts 0 - - - -   PHQ-9 Score 9 - - - -   Difficult doing work/chores Very difficult - - - -     Interpretation of Total Score  Total Score Depression Severity:  1-4 = Minimal depression, 5-9 = Mild depression, 10-14 = Moderate depression, 15-19 = Moderately severe depression, 20-27 = Severe depression   Psychosocial Evaluation and Intervention:  Psychosocial Evaluation - 10/03/20 1553      Psychosocial Evaluation & Interventions   Interventions Encouraged to exercise with the program and follow exercise prescription;Relaxation education;Stress management education    Comments [place] kids are not a source of his mental support. He can look to his wife and sisters for support.    Expected Outcomes Short: Exercise regularly to support mental health and notify staff of any changes. Long: maintain mental health and well being through teaching of rehab or prescribed medications independently.    Continue Psychosocial Services  Follow up required by staff           Psychosocial Re-Evaluation:   Psychosocial Discharge (Final Psychosocial Re-Evaluation):   Education: Education Goals: Education classes will be provided on a weekly basis, covering required topics. Participant will state understanding/return demonstration of topics presented.  Learning Barriers/Preferences:  Learning Barriers/Preferences - 10/03/20 1549      Learning Barriers/Preferences   Learning Barriers None    Learning Preferences None           General Pulmonary Education Topics:  Infection Prevention: - Provides verbal and written material to individual with discussion of infection control including proper hand washing and proper equipment cleaning during exercise  session. Flowsheet Row Pulmonary Rehab from 10/03/2020 in [facility] Cardiac and Pulmonary Rehab  Date 10/03/20  Educator [facility]  Instruction Review Code 1- Verbalizes Understanding      Falls Prevention: - Provides verbal and written material to individual with discussion of falls prevention and safety. Flowsheet Row Pulmonary Rehab from 10/03/2020 in [facility] Cardiac and Pulmonary Rehab  Date 10/03/20  Educator [facility]  Instruction Review Code 1- Verbalizes Understanding      Chronic Lung Disease Review: - Group verbal instruction with posters, models, PowerPoint presentations and videos,  to review new updates, new respiratory medications, new advancements in procedures and treatments. Providing information on websites and "800" numbers for continued self-education. Includes information about supplement oxygen, available portable oxygen systems, continuous and intermittent flow rates, oxygen safety, concentrators, and Medicare reimbursement for oxygen. Explanation of Pulmonary Drugs, including class, frequency, complications, importance of spacers, rinsing mouth after steroid MDI's, and proper cleaning methods for nebulizers. Review of basic lung anatomy and physiology related to function, structure, and complications of lung disease. Review of risk factors. Discussion about methods for diagnosing sleep apnea and types of masks and machines for OSA. Includes a review of the use of types of environmental controls: home humidity, furnaces, filters, dust mite/pet prevention, HEPA vacuums. Discussion about weather changes, air quality and the benefits of nasal washing. Instruction on Warning signs, infection symptoms, calling MD promptly, preventive modes, and value of vaccinations. Review of effective airway clearance, coughing  and/or vibration techniques. Emphasizing that all should Create an Action Plan. Written material given at graduation.   AED/CPR: - Group verbal and written instruction with the use of models  to demonstrate the basic use of the AED with the basic ABC's of resuscitation.    Anatomy and Cardiac Procedures: - Group verbal and visual presentation and models provide information about basic cardiac anatomy and function. Reviews the testing methods done to diagnose heart disease and the outcomes of the test results. Describes the treatment choices: Medical Management, Angioplasty, or Coronary Bypass Surgery for treating various heart conditions including Myocardial Infarction, Angina, Valve Disease, and Cardiac Arrhythmias.  Written material given at graduation.   Medication Safety: - Group verbal and visual instruction to review commonly prescribed medications for heart and lung disease. Reviews the medication, class of the drug, and side effects. Includes the steps to properly store meds and maintain the prescription regimen.  Written material given at graduation.   Other: -Provides group and verbal instruction on various topics (see comments)   Knowledge Questionnaire Score:  Knowledge Questionnaire Score - 10/03/20 1740      Knowledge Questionnaire Score   Pre Score 13/18: Oxygen, HR            Core Components/Risk Factors/Patient Goals at Admission:  Personal Goals and Risk Factors at Admission - 10/03/20 1549      Core Components/Risk Factors/Patient Goals on Admission    Weight Management Yes;Weight Maintenance   Patient reports maintain weight, despite BMI   Intervention Weight Management: Develop a combined nutrition and exercise program designed to reach desired caloric intake, while maintaining appropriate intake of nutrient and fiber, sodium and fats, and appropriate energy expenditure required for the weight goal.;Weight Management: Provide education and appropriate resources to help participant work on and attain dietary goals.;Weight Management/Obesity: Establish reasonable short term and long term weight goals.    Admit Weight 149 lb (67.6 kg)    Goal Weight:  Short Term 149 lb (67.6 kg)    Goal Weight: Long Term 149 lb (67.6 kg)    Expected Outcomes Short Term: Continue to assess and modify interventions until short term weight is achieved;Long Term: Adherence to nutrition and physical activity/exercise program aimed toward attainment of established weight goal;Weight Maintenance: Understanding of the daily nutrition guidelines, which includes 25-35% calories from fat, 7% or less cal from saturated fats, less than 200mg  cholesterol, less than 1.5gm of sodium, & 5 or more servings of fruits and vegetables daily;Understanding recommendations for meals to include 15-35% energy as protein, 25-35% energy from fat, 35-60% energy from carbohydrates, less than 200mg  of dietary cholesterol, 20-35 gm of total fiber daily;Understanding of distribution of calorie intake throughout the day with the consumption of 4-5 meals/snacks    Improve shortness of breath with ADL's Yes    Intervention Provide education, individualized exercise plan and daily activity instruction to help decrease symptoms of SOB with activities of daily living.    Expected Outcomes Short Term: Improve cardiorespiratory fitness to achieve a reduction of symptoms when performing ADLs;Long Term: Be able to perform more ADLs without symptoms or delay the onset of symptoms    Heart Failure Yes    Intervention Provide a combined exercise and nutrition program that is supplemented with education, support and counseling about heart failure. Directed toward relieving symptoms such as shortness of breath, decreased exercise tolerance, and extremity edema.    Expected Outcomes Improve functional capacity of life;Short term: Attendance in program 2-3 days a week with increased exercise  capacity. Reported lower sodium intake. Reported increased fruit and vegetable intake. Reports medication compliance.;Short term: Daily weights obtained and reported for increase. Utilizing diuretic protocols set by physician.;Long  term: Adoption of self-care skills and reduction of barriers for early signs and symptoms recognition and intervention leading to self-care maintenance.    Hypertension Yes    Intervention Provide education on lifestyle modifcations including regular physical activity/exercise, weight management, moderate sodium restriction and increased consumption of fresh fruit, vegetables, and low fat dairy, alcohol moderation, and smoking cessation.;Monitor prescription use compliance.    Expected Outcomes Short Term: Continued assessment and intervention until BP is < 140/50mm HG in hypertensive participants. < 130/58mm HG in hypertensive participants with diabetes, heart failure or chronic kidney disease.;Long Term: Maintenance of blood pressure at goal levels.    Lipids Yes    Intervention Provide education and support for participant on nutrition & aerobic/resistive exercise along with prescribed medications to achieve LDL 70mg , HDL >40mg .    Expected Outcomes Short Term: Participant states understanding of desired cholesterol values and is compliant with medications prescribed. Participant is following exercise prescription and nutrition guidelines.;Long Term: Cholesterol controlled with medications as prescribed, with individualized exercise RX and with personalized nutrition plan. Value goals: LDL < 70mg , HDL > 40 mg.           Education:Diabetes - Individual verbal and written instruction to review signs/symptoms of diabetes, desired ranges of glucose level fasting, after meals and with exercise. Acknowledge that pre and post exercise glucose checks will be done for 3 sessions at entry of program.   Know Your Numbers and Heart Failure: - Group verbal and visual instruction to discuss disease risk factors for cardiac and pulmonary disease and treatment options.  Reviews associated critical values for Overweight/Obesity, Hypertension, Cholesterol, and Diabetes.  Discusses basics of heart failure:  signs/symptoms and treatments.  Introduces Heart Failure Zone chart for action plan for heart failure.  Written material given at graduation.   Core Components/Risk Factors/Patient Goals Review:    Core Components/Risk Factors/Patient Goals at Discharge (Final Review):    ITP Comments:  ITP Comments    Row Name 10/03/20 1705           ITP Comments Completed 6 minute step test and gym orientation. Initial ITP created and sent for review to Dr. Emily Filbert, Medical Director.              Comments:

## 2020-10-10 DIAGNOSIS — I482 Chronic atrial fibrillation, unspecified: Secondary | ICD-10-CM | POA: Diagnosis not present

## 2020-10-13 ENCOUNTER — Encounter (INDEPENDENT_AMBULATORY_CARE_PROVIDER_SITE_OTHER): Payer: Self-pay | Admitting: Nurse Practitioner

## 2020-10-13 NOTE — Progress Notes (Signed)
Subjective:    Patient ID: Tyler [name], male    DOB: 25-Sep-1955, 65 y.o.   MRN: [id number] Chief Complaint  Patient presents with  . Follow-up    6 month follow up    Zahid Carneiro is a 65 year old male that returns today for follow-up evaluation of his left below-knee amputation.  This is been nearly a year ago.  The patient is doing well with his prosthetic and working with it every day.  He has a right above-knee amputation that was done decades ago.  Overall he is doing well.  There are no wounds or ulcers on his stump.  He denies any soft spots with his stump.   Review of Systems  Musculoskeletal: Positive for gait problem.  Neurological: Positive for weakness.  All other systems reviewed and are negative.      Objective:   Physical Exam Vitals reviewed.  HENT:     Head: Normocephalic.  Cardiovascular:     Rate and Rhythm: Normal rate.  Pulmonary:     Effort: Pulmonary effort is normal.     Comments: Home 02 Musculoskeletal:     Right Lower Extremity: Right leg is amputated above knee.     Left Lower Extremity: Left leg is amputated below knee.  Neurological:     Mental Status: He is alert and oriented to person, place, and time.     Motor: Weakness present.     Gait: Gait abnormal.  Psychiatric:        Mood and Affect: Mood normal.        Behavior: Behavior normal.        Thought Content: Thought content normal.        Judgment: Judgment normal.     BP 124/69 (BP Location: Left Arm)   Pulse 80   Resp 16   Past Medical History:  Diagnosis Date  . AICD (automatic cardioverter/defibrillator) present   . Anxiety   . Arthritis   . Atherosclerosis of artery of extremity with ulceration ([place]) 09/2019   left foot s/p toe amp requiring debridement and futher toe amputations.  . Atrial fibrillation ([place])   . Cervical spinal stenosis    with neuropathy  . CHF (congestive heart failure) ([place])   . Constipation   . COPD (chronic obstructive pulmonary  disease) ([place])   . Coronary artery disease   . Depression   . Dyspnea   . Dysrhythmia    atrial fibrillation  . Emphysema of lung ([place])   . Factor 5 Leiden mutation, heterozygous ([place])    on coumadin  . Factor V Leiden mutation ([place])   . GERD (gastroesophageal reflux disease)   . Hypertension   . Lung nodule seen on imaging study    being followed by dr. [name]. just watching it for last few years, without change  . Mitral valve insufficiency   . Moderate tricuspid insufficiency   . Myocardial infarction [facility]) 2004   stent placed, pacemaker implanted 2005  . Osteomyelitis ([place]) 10/2019   left foot  . Oxygen dependent    requires 2L nasal prong oxygen 24 hours a day  . Peripheral vascular disease ([place])   . Presence of permanent cardiac pacemaker 2005,2018  . PVD (peripheral vascular disease) ([place])   . Sleep apnea    waiting to have sleep study. Used bipap while hospitalized and said it was great for him.    Social History   Socioeconomic History  . Marital status: Married    Spouse name:  Bethena Roys  . Number of children: Not on file  . Years of education: Not on file  . Highest education level: Not on file  Occupational History  . Occupation: no employment    Comment: disabled  Tobacco Use  . Smoking status: Former Smoker    Packs/day: 1.00    Years: 42.00    Pack years: 42.00    Types: Cigarettes    Quit date: 09/23/2013    Years since quitting: 7.0  . Smokeless tobacco: Never Used  Vaping Use  . Vaping Use: Never used  Substance and Sexual Activity  . Alcohol use: No    Comment: quit 6 years ago  . Drug use: No  . Sexual activity: Yes  Other Topics Concern  . Not on file  Social History Narrative   Patient lives with wife and a dog.   Social Determinants of Health   Financial Resource Strain: Not on file  Food Insecurity: No Food Insecurity  . Worried About Charity fundraiser in the Last Year: Never true  . Ran Out of Food in the Last Year: Never true   Transportation Needs: No Transportation Needs  . Lack of Transportation (Medical): No  . Lack of Transportation (Non-Medical): No  Physical Activity: Not on file  Stress: No Stress Concern Present  . Feeling of Stress : Not at all  Social Connections: Not on file  Intimate Partner Violence: Not on file    Past Surgical History:  Procedure Laterality Date  . ABOVE KNEE LEG AMPUTATION Right    after below the knee amputation   . AMPUTATION Left 11/22/2019   Procedure: AMPUTATION BELOW KNEE;  Surgeon: Algernon Huxley, MD;  Location: [facility] ORS;  Service: Vascular;  Laterality: Left;  . AMPUTATION TOE Left 08/04/2019   Procedure: AMPUTATION TOE MPJ LEFT;  Surgeon: Samara Deist, DPM;  Location: [facility];  Service: Podiatry;  Laterality: Left;  . AMPUTATION TOE Left 10/06/2019   Procedure: AMPUTATION TOE MPJ T1,T2 LEFT;  Surgeon: Samara Deist, DPM;  Location: [facility];  Service: Podiatry;  Laterality: Left;  . APPLICATION OF WOUND VAC Left 01/12/2018   Procedure: APPLICATION OF WOUND VAC;  Surgeon: Algernon Huxley, MD;  Location: [facility];  Service: Vascular;  Laterality: Left;  . BELOW KNEE LEG AMPUTATION Right   . CATARACT EXTRACTION, BILATERAL Bilateral   . COLONOSCOPY N/A 02/19/2020   Procedure: COLONOSCOPY;  Surgeon: Lesly Rubenstein, MD;  Location: [facility] ENDOSCOPY;  Service: Endoscopy;  Laterality: N/A;  . COLONOSCOPY WITH PROPOFOL N/A 08/27/2020   Procedure: COLONOSCOPY WITH PROPOFOL;  Surgeon: Lesly Rubenstein, MD;  Location: [facility] ENDOSCOPY;  Service: Endoscopy;  Laterality: N/A;  REQ MID AM  . CORONARY ANGIOPLASTY  2005   stent x 1 placed  . ENDARTERECTOMY FEMORAL Left 08/11/2017   Procedure: ENDARTERECTOMY FEMORAL;  Surgeon: Algernon Huxley, MD;  Location: [facility];  Service: Vascular;  Laterality: Left;  . ESOPHAGOGASTRODUODENOSCOPY N/A 02/19/2020   Procedure: ESOPHAGOGASTRODUODENOSCOPY (EGD);  Surgeon: Lesly Rubenstein, MD;  Location: [facility] ENDOSCOPY;  Service: Endoscopy;  Laterality:  N/A;  . HEMATOMA EVACUATION Left 01/12/2018   Procedure: EVACUATION HEMATOMA ( DRAINING OF SEROMA);  Surgeon: Algernon Huxley, MD;  Location: [facility];  Service: Vascular;  Laterality: Left;  . IMPLANTABLE CARDIOVERTER DEFIBRILLATOR (ICD) GENERATOR CHANGE Left 02/10/2017   Procedure: ICD GENERATOR CHANGE;  Surgeon: Isaias Cowman, MD;  Location: [facility];  Service: Cardiovascular;  Laterality: Left;  . INSERT / REPLACE / REMOVE PACEMAKER  [id number],[id number], 2018  .  LOWER EXTREMITY ANGIOGRAPHY Left 06/07/2017   Procedure: LOWER EXTREMITY ANGIOGRAPHY;  Surgeon: Algernon Huxley, MD;  Location: [place] CV LAB;  Service: Cardiovascular;  Laterality: Left;  . LOWER EXTREMITY ANGIOGRAPHY Left 10/05/2019   Procedure: LOWER EXTREMITY ANGIOGRAPHY;  Surgeon: Algernon Huxley, MD;  Location: [place] CV LAB;  Service: Cardiovascular;  Laterality: Left;  . LOWER EXTREMITY INTERVENTION  06/07/2017   Procedure: LOWER EXTREMITY INTERVENTION;  Surgeon: Algernon Huxley, MD;  Location: [place] CV LAB;  Service: Cardiovascular;;  . PERIPHERAL VASCULAR CATHETERIZATION Left 12/09/2015   Procedure: Lower Extremity Angiography;  Surgeon: Algernon Huxley, MD;  Location: [place] CV LAB;  Service: Cardiovascular;  Laterality: Left;  . PERIPHERAL VASCULAR CATHETERIZATION  12/09/2015   Procedure: Lower Extremity Intervention;  Surgeon: Algernon Huxley, MD;  Location: [place] CV LAB;  Service: Cardiovascular;;  . TONSILLECTOMY    . TRANSMETATARSAL AMPUTATION Left 10/20/2019   Procedure: TRANSMETATARSAL AMPUTATION;  Surgeon: Sharlotte Alamo, DPM;  Location: [facility];  Service: Podiatry;  Laterality: Left;    Family History  Problem Relation Age of Onset  . Cancer Mother   . Cancer Father   . Heart disease Father     Allergies  Allergen Reactions  . Apixaban Rash  . Rivaroxaban Rash    CBC Latest Ref Rng & Units 03/15/2020 11/30/2019 11/28/2019  WBC 4.0 - 10.5 K/uL 7.8 7.5 5.9  Hemoglobin 13.0 - 17.0 g/dL 12.1(L) 8.1(L)  8.0(L)  Hematocrit 39.0 - 52.0 % 35.8(L) 24.1(L) 24.3(L)  Platelets 150 - 400 K/uL 239 407(H) 320      CMP     Component Value Date/Time   NA 133 (L) 03/15/2020 1631   NA 134 (L) 01/12/2014 1153   K 3.9 03/15/2020 1631   K 4.0 01/12/2014 1153   CL 95 (L) 03/15/2020 1631   CL 96 (L) 01/12/2014 1153   CO2 29 03/15/2020 1631   CO2 31 01/12/2014 1153   GLUCOSE 100 (H) 03/15/2020 1631   GLUCOSE 97 01/12/2014 1153   BUN 8 03/15/2020 1631   BUN 7 01/12/2014 1153   CREATININE 0.71 03/15/2020 1631   CREATININE 0.92 01/12/2014 1153   CALCIUM 9.2 03/15/2020 1631   CALCIUM 9.2 01/12/2014 1153   PROT 6.6 11/22/2019 1512   PROT 7.3 01/12/2014 1153   ALBUMIN 3.6 11/22/2019 1512   ALBUMIN 4.2 01/12/2014 1153   AST 17 11/22/2019 1512   AST 15 01/12/2014 1153   ALT 11 11/22/2019 1512   ALT 22 01/12/2014 1153   ALKPHOS 67 11/22/2019 1512   ALKPHOS 70 01/12/2014 1153   BILITOT 0.9 11/22/2019 1512   BILITOT 0.5 01/12/2014 1153   GFRNONAA >60 03/15/2020 1631   GFRNONAA >60 01/12/2014 1153   GFRAA >60 11/30/2019 0412   GFRAA >60 01/12/2014 1153     No results found.     Assessment & Plan:   1. Hx of BKA, left [facility])  Mr. Danelle Earthly is doing well working with his prosthetic.  There are no wounds or ulcerations on his stump.  He is doing well overall.  Because the patient's right lower extremity is also an above-knee amputation, we will have him follow-up on an as-needed basis.  Patient should contact her office if he modifications are related to his stump or if there are any wounds or ulcerations that develop.  2. Essential hypertension, benign Continue antihypertensive medications as already ordered, these medications have been reviewed and there are no changes at this time.   3. Mixed  hyperlipidemia Continue statin as ordered and reviewed, no changes at this time    Current Outpatient Medications on File Prior to Visit  Medication Sig Dispense Refill  . acetaminophen (TYLENOL)  500 MG tablet Take 1,000 mg by mouth daily as needed for moderate pain or headache.    . ALPRAZolam (XANAX) 1 MG tablet Take 1 mg by mouth 2 (two) times daily as needed for anxiety or sleep.     Marland Kitchen aspirin EC 81 MG tablet Take 1 tablet (81 mg total) by mouth daily. (Patient taking differently: Take 81 mg by mouth daily after supper.) 150 tablet 2  . azithromycin (ZITHROMAX) 250 MG tablet Take 250 mg by mouth daily. Monday, Wednesday, Friday for 90 days    . budesonide (PULMICORT) 0.25 MG/2ML nebulizer solution Inhale into the lungs.    . citalopram (CELEXA) 10 MG tablet Take 10 mg by mouth daily.    Marland Kitchen docusate sodium (COLACE) 100 MG capsule Take 100 mg by mouth daily as needed (for constipation.).     Marland Kitchen Dupilumab (DUPIXENT) 300 MG/2ML SOPN Inject 300 mg into the skin every 14 (fourteen) days. Inject 2 mLs (300mg ).    Delene Loll 24-26 MG Take 1 tablet by mouth 2 (two) times daily. 60 tablet 0  . EPINEPHrine 0.3 mg/0.3 mL IJ SOAJ injection Inject into the muscle.    . fluticasone (FLONASE) 50 MCG/ACT nasal spray Place 2 sprays into both nostrils daily.    . fluticasone-salmeterol (ADVAIR HFA) 115-21 MCG/ACT inhaler USE 2 INHALATIONS ORALLY EVERY 12 HOURS (Patient taking differently: Inhale 2 puffs into the lungs every 12 (twelve) hours.) 36 Inhaler 5  . furosemide (LASIX) 20 MG tablet Take 2 tablets (40 mg total) by mouth daily. (Patient taking differently: Take 20 mg by mouth daily.) 60 tablet 0  . gabapentin (NEURONTIN) 100 MG capsule Take 300 mg by mouth in the morning, at noon, and at bedtime.    Marland Kitchen guaiFENesin (MUCINEX) 600 MG 12 hr tablet Take 600 mg by mouth every evening.     Marland Kitchen ipratropium (ATROVENT) 0.03 % nasal spray Place 2 sprays into both nostrils 2 (two) times daily.     . Ipratropium-Albuterol (COMBIVENT) 20-100 MCG/ACT AERS respimat Inhale 1 puff into the lungs every 4 (four) hours.    Marland Kitchen ipratropium-albuterol (DUONEB) 0.5-2.5 (3) MG/3ML SOLN Inhale into the lungs.    Marland Kitchen JARDIANCE 10 MG  TABS tablet Take 10 mg by mouth daily with breakfast.    . lovastatin (MEVACOR) 20 MG tablet Take 1 tablet by mouth daily.    . metoprolol succinate (TOPROL-XL) 25 MG 24 hr tablet Take 25 mg by mouth daily.    . montelukast (SINGULAIR) 10 MG tablet Take 10 mg by mouth at bedtime.    . nitroGLYCERIN (NITROSTAT) 0.4 MG SL tablet Place 0.4 mg under the tongue every 5 (five) minutes as needed for chest pain.    Marland Kitchen oxyCODONE-acetaminophen (PERCOCET) 7.5-325 MG tablet Take 1-2 tablets by mouth every 6 (six) hours as needed for moderate pain. 50 tablet 0  . pantoprazole (PROTONIX) 40 MG tablet Take 40 mg by mouth daily before breakfast.     . predniSONE (DELTASONE) 20 MG tablet Take 20 mg by mouth daily.    Marland Kitchen Respiratory Therapy Supplies (FLUTTER) DEVI Use as directed. 1 each 0  . tamsulosin (FLOMAX) 0.4 MG CAPS capsule Take 0.4 mg by mouth daily after supper.   11  . vitamin B-12 (CYANOCOBALAMIN) 1000 MCG tablet Take 1,000 mcg by mouth daily.    Marland Kitchen  warfarin (COUMADIN) 5 MG tablet Take 5 mg by mouth daily.    . furosemide (LASIX) 20 MG tablet Take 20 mg by mouth. (Patient not taking: No sig reported)    . Gauze Pads & Dressings (RESTORE FOAM DRESSING) 6"X8" PADS by Does not apply route. (Patient not taking: No sig reported)    . levalbuterol (XOPENEX) 1.25 MG/3ML nebulizer solution Take 1.25 mg (3 mLs total) by nebulization every 4 (four) hours. (Patient not taking: No sig reported) 72 mL 12  . loratadine (CLARITIN) 10 MG tablet Take 10 mg by mouth at bedtime. (Patient not taking: No sig reported)    . lovastatin (MEVACOR) 20 MG tablet Take 20 mg by mouth at bedtime. (Patient not taking: No sig reported)    . mupirocin ointment (BACTROBAN) 2 % Apply 1 application topically daily. (Patient not taking: No sig reported) 22 g 0  . oxyCODONE-acetaminophen (PERCOCET) 7.5-325 MG tablet Take by mouth. (Patient not taking: No sig reported)    . roflumilast (DALIRESP) 500 MCG TABS tablet Take 250 mcg by mouth daily.  (Patient not taking: No sig reported)    . sildenafil (VIAGRA) 25 MG tablet Take by mouth.    . SPIRIVA RESPIMAT 1.25 MCG/ACT AERS INHALE 2 PUFFS BY MOUTH INTO THE LUNGS DAILY (Patient not taking: No sig reported) 4 g 6  . Wound Dressings (RESTORE WOUND CARE DRESSING) PADS Apply 1 each topically daily.  (Patient not taking: No sig reported)     No current facility-administered medications on file prior to visit.    There are no Patient Instructions on file for this visit. No follow-ups on file.   Kris Hartmann, NP

## 2020-10-14 ENCOUNTER — Other Ambulatory Visit: Payer: Self-pay

## 2020-10-14 DIAGNOSIS — Z87891 Personal history of nicotine dependence: Secondary | ICD-10-CM | POA: Diagnosis not present

## 2020-10-14 DIAGNOSIS — Z79899 Other long term (current) drug therapy: Secondary | ICD-10-CM | POA: Diagnosis not present

## 2020-10-14 DIAGNOSIS — J449 Chronic obstructive pulmonary disease, unspecified: Secondary | ICD-10-CM

## 2020-10-14 DIAGNOSIS — J439 Emphysema, unspecified: Secondary | ICD-10-CM | POA: Diagnosis not present

## 2020-10-14 NOTE — Progress Notes (Signed)
Daily Session Note  Patient Details  Name: Tyler [name] MRN: [id number] Date of Birth: 1955-10-21 Referring Provider:   Flowsheet Row Pulmonary Rehab from 10/03/2020 in [facility] Cardiac and Pulmonary Rehab  Referring Provider Ottie Glazier MD      Encounter Date: 10/14/2020  Check In:  Session Check In - 10/14/20 1409      Check-In   Supervising physician immediately available to respond to emergencies See telemetry face sheet for immediately available ER MD    Location [facility]-Cardiac & Pulmonary Rehab    Staff Present Birdie Sons, MPA, Mauricia Area, BS, ACSM CEP, Exercise Physiologist;Kara Eliezer Bottom, MS, ASCM CEP, Exercise Physiologist    Virtual Visit No    Medication changes reported     No    Fall or balance concerns reported    No    Warm-up and Cool-down Performed on first and last piece of equipment    Resistance Training Performed Yes    VAD Patient? No    PAD/SET Patient? No      Pain Assessment   Currently in Pain? No/denies              Social History   Tobacco Use  Smoking Status Former Smoker  . Packs/day: 1.00  . Years: 42.00  . Pack years: 42.00  . Types: Cigarettes  . Quit date: 09/23/2013  . Years since quitting: 7.0  Smokeless Tobacco Never Used    Goals Met:  Independence with exercise equipment Exercise tolerated well No report of cardiac concerns or symptoms Strength training completed today  Goals Unmet:  Not Applicable  Comments: First full day of exercise!  Patient was oriented to gym and equipment including functions, settings, policies, and procedures.  Patient's individual exercise prescription and treatment plan were reviewed.  All starting workloads were established based on the results of the 6 minute walk test done at initial orientation visit.  The plan for exercise progression was also introduced and progression will be customized based on patient's performance and goals.    Dr. Emily Filbert is Medical Director for  [place].  Dr. Ottie Glazier is Medical Director for [facility] Pulmonary Rehabilitation.

## 2020-10-16 ENCOUNTER — Other Ambulatory Visit: Payer: Self-pay

## 2020-10-16 DIAGNOSIS — J449 Chronic obstructive pulmonary disease, unspecified: Secondary | ICD-10-CM

## 2020-10-16 DIAGNOSIS — Z87891 Personal history of nicotine dependence: Secondary | ICD-10-CM | POA: Diagnosis not present

## 2020-10-16 DIAGNOSIS — J439 Emphysema, unspecified: Secondary | ICD-10-CM | POA: Diagnosis not present

## 2020-10-16 DIAGNOSIS — Z79899 Other long term (current) drug therapy: Secondary | ICD-10-CM | POA: Diagnosis not present

## 2020-10-16 NOTE — Progress Notes (Signed)
Daily Session Note  Patient Details  Name: Tyler [name] MRN: [id number] Date of Birth: 07-Mar-1956 Referring Provider:   Flowsheet Row Pulmonary Rehab from 10/03/2020 in [facility] Cardiac and Pulmonary Rehab  Referring Provider Ottie Glazier MD      Encounter Date: 10/16/2020  Check In:  Session Check In - 10/16/20 1409      Check-In   Supervising physician immediately available to respond to emergencies See telemetry face sheet for immediately available ER MD    Location [facility]-Cardiac & Pulmonary Rehab    Staff Present Birdie Sons, MPA, RN;Joseph Lou Miner, MS, ASCM CEP, Exercise Physiologist    Virtual Visit No    Medication changes reported     No    Fall or balance concerns reported    No    Warm-up and Cool-down Performed on first and last piece of equipment    Resistance Training Performed Yes    VAD Patient? No    PAD/SET Patient? No      Pain Assessment   Currently in Pain? No/denies              Social History   Tobacco Use  Smoking Status Former Smoker  . Packs/day: 1.00  . Years: 42.00  . Pack years: 42.00  . Types: Cigarettes  . Quit date: 09/23/2013  . Years since quitting: 7.0  Smokeless Tobacco Never Used    Goals Met:  Independence with exercise equipment Exercise tolerated well No report of cardiac concerns or symptoms Strength training completed today  Goals Unmet:  Not Applicable  Comments: Pt able to follow exercise prescription today without complaint.  Will continue to monitor for progression.    Dr. Emily Filbert is Medical Director for [place].  Dr. Ottie Glazier is Medical Director for [facility] Pulmonary Rehabilitation.

## 2020-10-21 DIAGNOSIS — J441 Chronic obstructive pulmonary disease with (acute) exacerbation: Secondary | ICD-10-CM | POA: Diagnosis not present

## 2020-10-21 DIAGNOSIS — J9610 Chronic respiratory failure, unspecified whether with hypoxia or hypercapnia: Secondary | ICD-10-CM | POA: Diagnosis not present

## 2020-10-24 DIAGNOSIS — J441 Chronic obstructive pulmonary disease with (acute) exacerbation: Secondary | ICD-10-CM | POA: Diagnosis not present

## 2020-10-24 DIAGNOSIS — M5000 Cervical disc disorder with myelopathy, unspecified cervical region: Secondary | ICD-10-CM | POA: Diagnosis not present

## 2020-10-28 ENCOUNTER — Encounter: Payer: Medicare HMO | Attending: Pulmonary Disease

## 2020-10-28 ENCOUNTER — Other Ambulatory Visit: Payer: Self-pay

## 2020-10-28 DIAGNOSIS — J449 Chronic obstructive pulmonary disease, unspecified: Secondary | ICD-10-CM | POA: Insufficient documentation

## 2020-10-28 NOTE — Progress Notes (Signed)
Daily Session Note  Patient Details  Name: Tyler [name] MRN: [id number] Date of Birth: 1956/05/14 Referring Provider:   Flowsheet Row Pulmonary Rehab from 10/03/2020 in [facility] Cardiac and Pulmonary Rehab  Referring Provider Ottie Glazier MD      Encounter Date: 10/28/2020  Check In:  Session Check In - 10/28/20 1411      Check-In   Supervising physician immediately available to respond to emergencies See telemetry face sheet for immediately available ER MD    Location [facility]-Cardiac & Pulmonary Rehab    Staff Present Birdie Sons, MPA, Mauricia Area, BS, ACSM CEP, Exercise Physiologist;Jessica Luan Pulling, MA, RCEP, CCRP, CCET    Virtual Visit No    Medication changes reported     No    Fall or balance concerns reported    No    Warm-up and Cool-down Performed on first and last piece of equipment    Resistance Training Performed Yes    VAD Patient? No    PAD/SET Patient? No      Pain Assessment   Currently in Pain? No/denies              Social History   Tobacco Use  Smoking Status Former Smoker  . Packs/day: 1.00  . Years: 42.00  . Pack years: 42.00  . Types: Cigarettes  . Quit date: 09/23/2013  . Years since quitting: 7.1  Smokeless Tobacco Never Used    Goals Met:  Independence with exercise equipment Exercise tolerated well No report of cardiac concerns or symptoms Strength training completed today  Goals Unmet:  Not Applicable  Comments: Pt able to follow exercise prescription today without complaint.  Will continue to monitor for progression.    Dr. Emily Filbert is Medical Director for [place].  Dr. Ottie Glazier is Medical Director for [facility] Pulmonary Rehabilitation.

## 2020-10-30 ENCOUNTER — Other Ambulatory Visit: Payer: Self-pay

## 2020-10-30 DIAGNOSIS — J449 Chronic obstructive pulmonary disease, unspecified: Secondary | ICD-10-CM

## 2020-10-30 NOTE — Progress Notes (Signed)
Daily Session Note  Patient Details  Name: Tyler [name] MRN: [id number] Date of Birth: [birth year] Referring Provider:   Flowsheet Row Pulmonary Rehab from 10/03/2020 in [facility] Cardiac and Pulmonary Rehab  Referring Provider Ottie Glazier MD      Encounter Date: 10/30/2020  Check In:  Session Check In - 10/30/20 1404      Check-In   Supervising physician immediately available to respond to emergencies See telemetry face sheet for immediately available ER MD    Location [facility]-Cardiac & Pulmonary Rehab    Staff Present Birdie Sons, MPA, RN;Joseph Lou Miner, MS, ASCM CEP, Exercise Physiologist    Virtual Visit No    Medication changes reported     No    Fall or balance concerns reported    No    Warm-up and Cool-down Performed on first and last piece of equipment    Resistance Training Performed Yes    VAD Patient? No    PAD/SET Patient? No      Pain Assessment   Currently in Pain? No/denies              Social History   Tobacco Use  Smoking Status Former Smoker  . Packs/day: 1.00  . Years: 42.00  . Pack years: 42.00  . Types: Cigarettes  . Quit date: 09/23/2013  . Years since quitting: 7.1  Smokeless Tobacco Never Used    Goals Met:  Independence with exercise equipment Exercise tolerated well No report of cardiac concerns or symptoms Strength training completed today  Goals Unmet:  Not Applicable  Comments: Pt able to follow exercise prescription today without complaint.  Will continue to monitor for progression.    Dr. Emily Filbert is Medical Director for [place].  Dr. Ottie Glazier is Medical Director for [facility] Pulmonary Rehabilitation.

## 2020-11-05 ENCOUNTER — Other Ambulatory Visit: Payer: Self-pay

## 2020-11-05 NOTE — Patient Instructions (Signed)
Goals      Track and Manage My Symptoms     Barriers: Health Behaviors Timeframe:  Long-Range Goal Priority:  High Start Date:  03/04/20                          Expected End Date:    05/24/21                  Follow Up Date: 02/21/21   - follow rescue plan if symptoms flare-up    Why is this important?   Tracking your symptoms and other information about your health helps your doctor plan your care.  Write down the symptoms, the time of day, what you were doing and what medicine you are taking.  You will soon learn how to manage your symptoms.     Notes: Keep up the great work!! 11/05/20  COPD Management Discussed: Oxygen use- keeping oxygen levels above 80. Encouraged patient to pace self with activity. Reviewed importance of medication compliance. Reviewed when to call for physician for increased shortness of breath, increased sputum, fatigue that is not his normal, cough, fever or sick contacts.

## 2020-11-05 NOTE — Patient Outreach (Signed)
[facility]) Care Management  [place]  11/05/2020   Tyler [name] [birth year] [id number]  Subjective: Telephone call to patient for disease management follow up. Patient reports he is doing good. Participating in pulmonary rehab.  He states is tiring. Discussed COPD management and exercise.  He verbalized understanding.    Objective:   Encounter Medications:  Outpatient Encounter Medications as of 11/05/2020  Medication Sig Note   acetaminophen (TYLENOL) 500 MG tablet Take 1,000 mg by mouth daily as needed for moderate pain or headache.    ALPRAZolam (XANAX) 1 MG tablet Take 1 mg by mouth 2 (two) times daily as needed for anxiety or sleep.     aspirin EC 81 MG tablet Take 1 tablet (81 mg total) by mouth daily. (Patient taking differently: Take 81 mg by mouth daily after supper.)    azithromycin (ZITHROMAX) 250 MG tablet Take 250 mg by mouth daily. Monday, Wednesday, Friday for 90 days    budesonide (PULMICORT) 0.25 MG/2ML nebulizer solution Inhale into the lungs.    citalopram (CELEXA) 10 MG tablet Take 10 mg by mouth daily.    docusate sodium (COLACE) 100 MG capsule Take 100 mg by mouth daily as needed (for constipation.).     Dupilumab (DUPIXENT) 300 MG/2ML SOPN Inject 300 mg into the skin every 14 (fourteen) days. Inject 2 mLs (300mg ).    ENTRESTO 24-26 MG Take 1 tablet by mouth 2 (two) times daily.    EPINEPHrine 0.3 mg/0.3 mL IJ SOAJ injection Inject into the muscle.    fluticasone (FLONASE) 50 MCG/ACT nasal spray Place 2 sprays into both nostrils daily.    fluticasone-salmeterol (ADVAIR HFA) 115-21 MCG/ACT inhaler USE 2 INHALATIONS ORALLY EVERY 12 HOURS (Patient taking differently: Inhale 2 puffs into the lungs every 12 (twelve) hours.)    furosemide (LASIX) 20 MG tablet Take 2 tablets (40 mg total) by mouth daily. (Patient taking differently: Take 20 mg by mouth daily.)    furosemide (LASIX) 20 MG tablet Take 20 mg by mouth. (Patient not taking: No sig  reported)    gabapentin (NEURONTIN) 100 MG capsule Take 300 mg by mouth in the morning, at noon, and at bedtime.    Gauze Pads & Dressings (RESTORE FOAM DRESSING) 6"X8" PADS by Does not apply route. (Patient not taking: No sig reported)    guaiFENesin (MUCINEX) 600 MG 12 hr tablet Take 600 mg by mouth every evening.     ipratropium (ATROVENT) 0.03 % nasal spray Place 2 sprays into both nostrils 2 (two) times daily.     Ipratropium-Albuterol (COMBIVENT) 20-100 MCG/ACT AERS respimat Inhale 1 puff into the lungs every 4 (four) hours. 07/27/2019: Pt confirmed he takes every 4 hours   ipratropium-albuterol (DUONEB) 0.5-2.5 (3) MG/3ML SOLN Inhale into the lungs.    JARDIANCE 10 MG TABS tablet Take 10 mg by mouth daily with breakfast.    levalbuterol (XOPENEX) 1.25 MG/3ML nebulizer solution Take 1.25 mg (3 mLs total) by nebulization every 4 (four) hours. (Patient not taking: No sig reported)    loratadine (CLARITIN) 10 MG tablet Take 10 mg by mouth at bedtime. (Patient not taking: No sig reported)    lovastatin (MEVACOR) 20 MG tablet Take 20 mg by mouth at bedtime. (Patient not taking: No sig reported)    lovastatin (MEVACOR) 20 MG tablet Take 1 tablet by mouth daily.    metoprolol succinate (TOPROL-XL) 25 MG 24 hr tablet Take 25 mg by mouth daily.    montelukast (SINGULAIR) 10 MG tablet Take  10 mg by mouth at bedtime.    mupirocin ointment (BACTROBAN) 2 % Apply 1 application topically daily. (Patient not taking: No sig reported)    nitroGLYCERIN (NITROSTAT) 0.4 MG SL tablet Place 0.4 mg under the tongue every 5 (five) minutes as needed for chest pain.    oxyCODONE-acetaminophen (PERCOCET) 7.5-325 MG tablet Take 1-2 tablets by mouth every 6 (six) hours as needed for moderate pain.    oxyCODONE-acetaminophen (PERCOCET) 7.5-325 MG tablet Take by mouth. (Patient not taking: No sig reported)    pantoprazole (PROTONIX) 40 MG tablet Take 40 mg by mouth daily before breakfast.     predniSONE (DELTASONE) 20 MG  tablet Take 20 mg by mouth daily.    Respiratory Therapy Supplies (FLUTTER) DEVI Use as directed.    roflumilast (DALIRESP) 500 MCG TABS tablet Take 250 mcg by mouth daily. (Patient not taking: No sig reported)    sildenafil (VIAGRA) 25 MG tablet Take by mouth.    SPIRIVA RESPIMAT 1.25 MCG/ACT AERS INHALE 2 PUFFS BY MOUTH INTO THE LUNGS DAILY (Patient not taking: No sig reported)    tamsulosin (FLOMAX) 0.4 MG CAPS capsule Take 0.4 mg by mouth daily after supper.     vitamin B-12 (CYANOCOBALAMIN) 1000 MCG tablet Take 1,000 mcg by mouth daily.    warfarin (COUMADIN) 5 MG tablet Take 5 mg by mouth daily.    Wound Dressings (RESTORE WOUND CARE DRESSING) PADS Apply 1 each topically daily.  (Patient not taking: No sig reported)    No facility-administered encounter medications on file as of 11/05/2020.    Functional Status:  In your present state of health, do you have any difficulty performing the following activities: 08/06/2020 02/01/2020  Hearing? N N  Vision? N N  Difficulty concentrating or making decisions? N N  Walking or climbing stairs? [name]  Comment Patient double amputee Patient double amputee  Dressing or bathing? N N  Doing errands, shopping? Y Y  Comment Wife assists Wife assists  Preparing Food and eating ? N N  Using the Toilet? N N  In the past six months, have you accidently leaked urine? N N  Do you have problems with loss of bowel control? N N  Managing your Medications? N N  Managing your Finances? [name]  Comment wife assists wife assists  Housekeeping or managing your Housekeeping? [name]  Comment wife assists wife assists  Some recent data might be hidden    Fall/Depression Screening: Fall Risk  11/05/2020 10/03/2020 08/06/2020  Falls in the past year? 0 0 0  Number falls in past yr: - 0 -  Injury with Fall? - 0 -  Risk for fall due to : - - -  Follow up - Falls evaluation completed;Education provided;Falls prevention discussed -   PHQ 2/9 Scores 10/28/2020 10/03/2020  08/06/2020 04/05/2020 02/01/2020 12/01/2019 10/30/2019  PHQ - 2 Score 0 1 0 0 0 0 0  PHQ- 9 Score 4 9 - - - - -    Assessment:   Care Plan Care Plan : COPD (Adult)  Updates made by Jon Billings, RN since 11/05/2020 12:00 AM     Problem: Symptom Exacerbation (COPD)      Long-Range Goal: Symptom Exacerbation Prevented or Minimized as evidenced by no COPD exacerbations   Start Date: 03/04/2020  Expected End Date: 05/24/2021  This Visit's Progress: On track  Recent Progress: On track  Priority: High  Note:   Evidence-based guidance:   Promote physical activity or exercise to improve or  maintain exercise capacity,  based on tolerance that may include walking, water exercise, cycling or limb  muscle strength training.  Promote use of energy conservation and activity pacing techniques.Monitor for signs of respiratory infection, including changes in sputum color, volume and thickness, as well as fever.  Encourage infection prevention strategies that may include prophylactic antibiotic therapy for patients with history of frequent exacerbations or antibiotic administration during exacerbation based on presentation, risk and benefit.  Notes:     Task: Identify and Minimize Risk of COPD Exacerbation   Due Date: 05/24/2021  Priority: Routine  Responsible User: Jon Billings, RN  Note:   Care Management Activities:  Reiterated:   - healthy lifestyle promoted - rescue (action) plan reviewed    Notes: 08/06/20 patient has not had any exacerbation of COPD.  11/05/20 Patient doing pulmonary rehab.  He reports that is it tiring and not sure if he will be able to complete all 36 weeks.  Discussed home strengthening exercises.        Goals Addressed             This Visit's Progress    Track and Manage My Symptoms   On track    Barriers: Health Behaviors Timeframe:  Long-Range Goal Priority:  High Start Date:  03/04/20                          Expected End Date:    05/24/21                   Follow Up Date: 02/21/21   - follow rescue plan if symptoms flare-up    Why is this important?   Tracking your symptoms and other information about your health helps your doctor plan your care.  Write down the symptoms, the time of day, what you were doing and what medicine you are taking.  You will soon learn how to manage your symptoms.     Notes: Keep up the great work!! 11/05/20  COPD Management Discussed: Oxygen use- keeping oxygen levels above 80. Encouraged patient to pace self with activity. Reviewed importance of medication compliance. Reviewed when to call for physician for increased shortness of breath, increased sputum, fatigue that is not his normal, cough, fever or sick contacts.          Plan:  Follow-up: Patient agrees to Care Plan and Follow-up. Follow-up in 3 month(s)   [name], RN, MSN [place] [facility] Care Management Coordinator Direct Line [phone] Cell [phone] Toll Free: [phone]  Fax: [phone]

## 2020-11-06 ENCOUNTER — Encounter: Payer: Self-pay | Admitting: *Deleted

## 2020-11-06 DIAGNOSIS — J449 Chronic obstructive pulmonary disease, unspecified: Secondary | ICD-10-CM

## 2020-11-06 NOTE — Progress Notes (Signed)
Pulmonary Individual Treatment Plan  Patient Details  Name: Tyler KUHNER MRN: [id number] Date of Birth: September 08, 1955 Referring Provider:   Flowsheet Row Pulmonary Rehab from 10/03/2020 in [facility] Cardiac and Pulmonary Rehab  Referring Provider Ottie Glazier MD       Initial Encounter Date:  Flowsheet Row Pulmonary Rehab from 10/03/2020 in [facility] Cardiac and Pulmonary Rehab  Date 10/03/20       Visit Diagnosis: Chronic obstructive pulmonary disease, unspecified COPD type ([place])  Patient's Home Medications on Admission:  Current Outpatient Medications:    acetaminophen (TYLENOL) 500 MG tablet, Take 1,000 mg by mouth daily as needed for moderate pain or headache., Disp: , Rfl:    ALPRAZolam (XANAX) 1 MG tablet, Take 1 mg by mouth 2 (two) times daily as needed for anxiety or sleep. , Disp: , Rfl:    aspirin EC 81 MG tablet, Take 1 tablet (81 mg total) by mouth daily. (Patient taking differently: Take 81 mg by mouth daily after supper.), Disp: 150 tablet, Rfl: 2   azithromycin (ZITHROMAX) 250 MG tablet, Take 250 mg by mouth daily. Monday, Wednesday, Friday for 90 days, Disp: , Rfl:    budesonide (PULMICORT) 0.25 MG/2ML nebulizer solution, Inhale into the lungs., Disp: , Rfl:    citalopram (CELEXA) 10 MG tablet, Take 10 mg by mouth daily., Disp: , Rfl:    docusate sodium (COLACE) 100 MG capsule, Take 100 mg by mouth daily as needed (for constipation.). , Disp: , Rfl:    Dupilumab (DUPIXENT) 300 MG/2ML SOPN, Inject 300 mg into the skin every 14 (fourteen) days. Inject 2 mLs (300mg )., Disp: , Rfl:    ENTRESTO 24-26 MG, Take 1 tablet by mouth 2 (two) times daily., Disp: 60 tablet, Rfl: 0   EPINEPHrine 0.3 mg/0.3 mL IJ SOAJ injection, Inject into the muscle., Disp: , Rfl:    fluticasone (FLONASE) 50 MCG/ACT nasal spray, Place 2 sprays into both nostrils daily., Disp: , Rfl:    fluticasone-salmeterol (ADVAIR HFA) 115-21 MCG/ACT inhaler, USE 2 INHALATIONS ORALLY EVERY 12 HOURS (Patient taking  differently: Inhale 2 puffs into the lungs every 12 (twelve) hours.), Disp: 36 Inhaler, Rfl: 5   furosemide (LASIX) 20 MG tablet, Take 2 tablets (40 mg total) by mouth daily. (Patient taking differently: Take 20 mg by mouth daily.), Disp: 60 tablet, Rfl: 0   furosemide (LASIX) 20 MG tablet, Take 20 mg by mouth. (Patient not taking: No sig reported), Disp: , Rfl:    gabapentin (NEURONTIN) 100 MG capsule, Take 300 mg by mouth in the morning, at noon, and at bedtime., Disp: , Rfl:    Gauze Pads & Dressings (RESTORE FOAM DRESSING) 6"X8" PADS, by Does not apply route. (Patient not taking: No sig reported), Disp: , Rfl:    guaiFENesin (MUCINEX) 600 MG 12 hr tablet, Take 600 mg by mouth every evening. , Disp: , Rfl:    ipratropium (ATROVENT) 0.03 % nasal spray, Place 2 sprays into both nostrils 2 (two) times daily. , Disp: , Rfl:    Ipratropium-Albuterol (COMBIVENT) 20-100 MCG/ACT AERS respimat, Inhale 1 puff into the lungs every 4 (four) hours., Disp: , Rfl:    ipratropium-albuterol (DUONEB) 0.5-2.5 (3) MG/3ML SOLN, Inhale into the lungs., Disp: , Rfl:    JARDIANCE 10 MG TABS tablet, Take 10 mg by mouth daily with breakfast., Disp: , Rfl:    levalbuterol (XOPENEX) 1.25 MG/3ML nebulizer solution, Take 1.25 mg (3 mLs total) by nebulization every 4 (four) hours. (Patient not taking: No sig reported), Disp: 72 mL,  Rfl: 12   loratadine (CLARITIN) 10 MG tablet, Take 10 mg by mouth at bedtime. (Patient not taking: No sig reported), Disp: , Rfl:    lovastatin (MEVACOR) 20 MG tablet, Take 20 mg by mouth at bedtime. (Patient not taking: No sig reported), Disp: , Rfl:    lovastatin (MEVACOR) 20 MG tablet, Take 1 tablet by mouth daily., Disp: , Rfl:    metoprolol succinate (TOPROL-XL) 25 MG 24 hr tablet, Take 25 mg by mouth daily., Disp: , Rfl:    montelukast (SINGULAIR) 10 MG tablet, Take 10 mg by mouth at bedtime., Disp: , Rfl:    mupirocin ointment (BACTROBAN) 2 %, Apply 1 application topically daily. (Patient not  taking: No sig reported), Disp: 22 g, Rfl: 0   nitroGLYCERIN (NITROSTAT) 0.4 MG SL tablet, Place 0.4 mg under the tongue every 5 (five) minutes as needed for chest pain., Disp: , Rfl:    oxyCODONE-acetaminophen (PERCOCET) 7.5-325 MG tablet, Take 1-2 tablets by mouth every 6 (six) hours as needed for moderate pain., Disp: 50 tablet, Rfl: 0   oxyCODONE-acetaminophen (PERCOCET) 7.5-325 MG tablet, Take by mouth. (Patient not taking: No sig reported), Disp: , Rfl:    pantoprazole (PROTONIX) 40 MG tablet, Take 40 mg by mouth daily before breakfast. , Disp: , Rfl:    predniSONE (DELTASONE) 20 MG tablet, Take 20 mg by mouth daily., Disp: , Rfl:    Respiratory Therapy Supplies (FLUTTER) DEVI, Use as directed., Disp: 1 each, Rfl: 0   roflumilast (DALIRESP) 500 MCG TABS tablet, Take 250 mcg by mouth daily. (Patient not taking: No sig reported), Disp: , Rfl:    sildenafil (VIAGRA) 25 MG tablet, Take by mouth., Disp: , Rfl:    SPIRIVA RESPIMAT 1.25 MCG/ACT AERS, INHALE 2 PUFFS BY MOUTH INTO THE LUNGS DAILY (Patient not taking: No sig reported), Disp: 4 g, Rfl: 6   tamsulosin (FLOMAX) 0.4 MG CAPS capsule, Take 0.4 mg by mouth daily after supper. , Disp: , Rfl: 11   vitamin B-12 (CYANOCOBALAMIN) 1000 MCG tablet, Take 1,000 mcg by mouth daily., Disp: , Rfl:    warfarin (COUMADIN) 5 MG tablet, Take 5 mg by mouth daily., Disp: , Rfl:    Wound Dressings (RESTORE WOUND CARE DRESSING) PADS, Apply 1 each topically daily.  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   Atrial fibrillation (HCC)    Cervical spinal stenosis    with neuropathy   CHF (congestive heart failure) (HCC)    Constipation    COPD (chronic obstructive  pulmonary disease) (HCC)    Coronary artery disease    Depression    Dyspnea    Dysrhythmia    atrial fibrillation   Emphysema of lung (HCC)    Factor 5 Leiden mutation, heterozygous ([place])    on coumadin   Factor V Leiden mutation ([place])    GERD (gastroesophageal reflux disease)    Hypertension    Lung nodule seen on imaging study    being followed by dr. [name]. just watching it for last few years, without change   Mitral valve insufficiency    Moderate tricuspid insufficiency    Myocardial infarction [facility]) 2004   stent placed, pacemaker implanted 2005  Osteomyelitis ([place]) 10/2019   left foot   Oxygen dependent    requires 2L nasal prong oxygen 24 hours a day   Peripheral vascular disease ([place])    Presence of permanent cardiac pacemaker [phone]   PVD (peripheral vascular disease) ([place])    Sleep apnea    waiting to have sleep study. Used bipap while hospitalized and said it was great for him.    Tobacco Use: Social History   Tobacco Use  Smoking Status Former   Packs/day: 1.00   Years: 42.00   Pack years: 42.00   Types: Cigarettes   Quit date: 09/23/2013   Years since quitting: 7.1  Smokeless Tobacco Never    Labs: Recent Review Flowsheet Data     Labs for ITP Cardiac and Pulmonary Rehab Latest Ref Rng & Units 10/13/2016 10/16/2016 05/05/2017 06/22/2017 10/25/2019   PHART 7.350 - 7.450 - 7.38 7.43 - -   PCO2ART 32.0 - 48.0 mmHg - 46 38 - -   HCO3 20.0 - 28.0 mmol/L 25.2 27.2 25.2 29.4(H) 31.0(H)   ACIDBASEDEF 0.0 - 2.0 mmol/L 4.4(H) - - - -   O2SAT % - 95.9 97.4 - 72.1        Pulmonary Assessment Scores:  Pulmonary Assessment Scores     Row Name 10/03/20 1708         ADL UCSD   ADL Phase Entry     SOB Score total 66     Rest 0     Walk 4     Stairs 5     Bath 2     Dress 1     Shop 4           CAT Score     CAT Score 21           mMRC Score     mMRC Score 4             UCSD: Self-administered rating of dyspnea associated with activities  of daily living (ADLs) 6-point scale (0 = "not at all" to 5 = "maximal or unable to do because of breathlessness")  Scoring Scores range from 0 to 120.  Minimally important difference is 5 units  CAT: CAT can identify the health impairment of COPD patients and is better correlated with disease progression.  CAT has a scoring range of zero to 40. The CAT score is classified into four groups of low (less than 10), medium (10 - 20), high (21-30) and very high (31-40) based on the impact level of disease on health status. A CAT score over 10 suggests significant symptoms.  A worsening CAT score could be explained by an exacerbation, poor medication adherence, poor inhaler technique, or progression of COPD or comorbid conditions.  CAT MCID is 2 points  mMRC: mMRC (Modified Medical Research Council) Dyspnea Scale is used to assess the degree of baseline functional disability in patients of respiratory disease due to dyspnea. No minimal important difference is established. A decrease in score of 1 point or greater is considered a positive change.   Pulmonary Function Assessment:  Pulmonary Function Assessment - 10/03/20 1548       Pulmonary Function Tests   FVC% 73.1 %    FEV1% 30.6 %    FEV1/FVC Ratio 32      Post Bronchodilator Spirometry Results   FVC% --    FEV1% --    FEV1/FVC Ratio --      Breath   Bilateral Breath Sounds Clear;Decreased  Shortness of Breath Yes;Fear of Shortness of Breath;Panic with Shortness of Breath;Limiting activity             Exercise Target Goals: Exercise Program Goal: Individual exercise prescription set using results from initial 6 min walk test and THRR while considering  patient's activity barriers and safety.   Exercise Prescription Goal: Initial exercise prescription builds to 30-45 minutes a day of aerobic activity, 2-3 days per week.  Home exercise guidelines will be given to patient during program as part of exercise prescription that the  participant will acknowledge.  Education: Aerobic Exercise: - Group verbal and visual presentation on the components of exercise prescription. Introduces F.I.T.T principle from ACSM for exercise prescriptions.  Reviews F.I.T.T. principles of aerobic exercise including progression. Written material given at graduation.   Education: Resistance Exercise: - Group verbal and visual presentation on the components of exercise prescription. Introduces F.I.T.T principle from ACSM for exercise prescriptions  Reviews F.I.T.T. principles of resistance exercise including progression. Written material given at graduation.    Education: Exercise & Equipment Safety: - Individual verbal instruction and demonstration of equipment use and safety with use of the equipment. Flowsheet Row Pulmonary Rehab from 10/16/2020 in [facility] Cardiac and Pulmonary Rehab  Date 10/03/20  Educator [facility]  Instruction Review Code 1- Verbalizes Understanding       Education: Exercise Physiology & General Exercise Guidelines: - Group verbal and written instruction with models to review the exercise physiology of the cardiovascular system and associated critical values. Provides general exercise guidelines with specific guidelines to those with heart or lung disease.    Education: Flexibility, Balance, Mind/Body Relaxation: - Group verbal and visual presentation with interactive activity on the components of exercise prescription. Introduces F.I.T.T principle from ACSM for exercise prescriptions. Reviews F.I.T.T. principles of flexibility and balance exercise training including progression. Also discusses the mind body connection.  Reviews various relaxation techniques to help reduce and manage stress (i.e. Deep breathing, progressive muscle relaxation, and visualization). Balance handout provided to take home. Written material given at graduation.   Activity Barriers & Risk Stratification:  Activity Barriers & Cardiac Risk  Stratification - 10/03/20 1729       Activity Barriers & Cardiac Risk Stratification   Activity Barriers Shortness of Breath;Assistive Device;Decreased Ventricular Function;Neck/Spine Problems;Muscular Weakness;Deconditioning;Balance Concerns;Other (comment)    Comments Double amputee/ prosthetic leg             6 Minute Walk:  6 Minute Walk     Row Name 10/03/20 1730         6 Minute Walk   Phase Initial     Distance 246 feet  Step test (total # of steps); wheelchair bound     Walk Time 5.2 minutes     # of Rest Breaks 1     MPH --  N/A     METS 1.08     RPE 11     Perceived Dyspnea  2     VO2 Peak 3.8     Symptoms Yes (comment)     Comments SOB     Resting HR 76 bpm     Resting BP 96/60     Resting Oxygen Saturation  97 %     Exercise Oxygen Saturation  during 6 min walk 94 %     Max Ex. HR 85 bpm     Max Ex. BP 104/60     2 Minute Post BP 100/62           Interval HR  1 Minute HR 81     2 Minute HR 82     3 Minute HR 85     4 Minute HR 83     5 Minute HR 82     6 Minute HR 80     2 Minute Post HR 76     Interval Heart Rate? Yes           Interval Oxygen     Interval Oxygen? Yes     Baseline Oxygen Saturation % 97 %     1 Minute Oxygen Saturation % 97 %     1 Minute Liters of Oxygen 2 L     2 Minute Oxygen Saturation % 98 %     2 Minute Liters of Oxygen 2 L     3 Minute Oxygen Saturation % 95 %     3 Minute Liters of Oxygen 2 L     4 Minute Oxygen Saturation % 94 %     4 Minute Liters of Oxygen 2 L     5 Minute Oxygen Saturation % 96 %     5 Minute Liters of Oxygen 2 L     6 Minute Oxygen Saturation % 97 %     6 Minute Liters of Oxygen 2 L     2 Minute Post Oxygen Saturation % 98 %     2 Minute Post Liters of Oxygen 2 L            Oxygen Initial Assessment:  Oxygen Initial Assessment - 10/03/20 1708       Home Oxygen   Home Oxygen Device Home Concentrator;E-Tanks    Sleep Oxygen Prescription Continuous;BiPAP;CPAP    Liters per  minute 2    Home Exercise Oxygen Prescription Continuous    Liters per minute 2    Home Resting Oxygen Prescription Continuous    Liters per minute 2    Compliance with Home Oxygen Use Yes      Initial 6 min Walk   Oxygen Used Continuous;E-Tanks    Liters per minute 2      Program Oxygen Prescription   Program Oxygen Prescription Continuous;E-Tanks    Liters per minute 2      Intervention   Short Term Goals To learn and exhibit compliance with exercise, home and travel O2 prescription;To learn and understand importance of maintaining oxygen saturations>88%;To learn and understand importance of monitoring SPO2 with pulse oximeter and demonstrate accurate use of the pulse oximeter.;To learn and demonstrate proper pursed lip breathing techniques or other breathing techniques. ;To learn and demonstrate proper use of respiratory medications    Long  Term Goals Exhibits compliance with exercise, home  and travel O2 prescription;Verbalizes importance of monitoring SPO2 with pulse oximeter and return demonstration;Maintenance of O2 saturations>88%;Exhibits proper breathing techniques, such as pursed lip breathing or other method taught during program session;Compliance with respiratory medication;Demonstrates proper use of MDI's             Oxygen Re-Evaluation:  Oxygen Re-Evaluation     Row Name 10/14/20 1411 10/28/20 1423           Program Oxygen Prescription   Program Oxygen Prescription Continuous;E-Tanks Continuous      Liters per minute 2 2             Home Oxygen      Home Oxygen Device Home Concentrator;E-Tanks Home Concentrator;E-Tanks      Sleep Oxygen Prescription Continuous;BiPAP;CPAP CPAP;BiPAP;Continuous      Liters per  minute 2 2      Home Exercise Oxygen Prescription Continuous Continuous      Liters per minute 2 2      Home Resting Oxygen Prescription Continuous Continuous      Liters per minute 2 2      Compliance with Home Oxygen Use Yes Yes              Goals/Expected Outcomes      Short Term Goals To learn and exhibit compliance with exercise, home and travel O2 prescription;To learn and understand importance of maintaining oxygen saturations>88%;To learn and understand importance of monitoring SPO2 with pulse oximeter and demonstrate accurate use of the pulse oximeter.;To learn and demonstrate proper pursed lip breathing techniques or other breathing techniques.  To learn and exhibit compliance with exercise, home and travel O2 prescription;Other      Long  Term Goals Exhibits compliance with exercise, home  and travel O2 prescription;Verbalizes importance of monitoring SPO2 with pulse oximeter and return demonstration;Maintenance of O2 saturations>88%;Exhibits proper breathing techniques, such as pursed lip breathing or other method taught during program session Exhibits compliance with exercise, home  and travel O2 prescription;Other      Comments Reviewed PLB technique with pt.  Talked about how it works and it's importance in maintaining their exercise saturations. Osker states that he sleeps with a Ventilator. He uses a Trilogy Vent but is not sure what his settings are. Imformed him to take a picture of his home ventilator screen so he can know if he is using it as a CPAP, BIPAP or other. He state that he wears his device everynight for sleep.      Goals/Expected Outcomes Short: Become more profiecient at using PLB.   Long: Become independent at using PLB. Short: inform staff of sleeping mode. Long: maintain ventilator independently              Oxygen Discharge (Final Oxygen Re-Evaluation):  Oxygen Re-Evaluation - 10/28/20 1423       Program Oxygen Prescription   Program Oxygen Prescription Continuous    Liters per minute 2      Home Oxygen   Home Oxygen Device Home Concentrator;E-Tanks    Sleep Oxygen Prescription CPAP;BiPAP;Continuous    Liters per minute 2    Home Exercise Oxygen Prescription Continuous    Liters per minute 2     Home Resting Oxygen Prescription Continuous    Liters per minute 2    Compliance with Home Oxygen Use Yes      Goals/Expected Outcomes   Short Term Goals To learn and exhibit compliance with exercise, home and travel O2 prescription;Other    Long  Term Goals Exhibits compliance with exercise, home  and travel O2 prescription;Other    Comments Darion states that he sleeps with a Ventilator. He uses a Trilogy Vent but is not sure what his settings are. Imformed him to take a picture of his home ventilator screen so he can know if he is using it as a CPAP, BIPAP or other. He state that he wears his device everynight for sleep.    Goals/Expected Outcomes Short: inform staff of sleeping mode. Long: maintain ventilator independently             Initial Exercise Prescription:  Initial Exercise Prescription - 10/03/20 1700       Date of Initial Exercise RX and Referring Provider   Date 10/03/20    Referring Provider Ottie Glazier MD      Oxygen  Oxygen Continuous    Liters 2      NuStep   Level 1    SPM 80    Minutes 15    METs 1      Arm Ergometer   Level 1    RPM 30    Minutes 15    METs 1      Biostep-RELP   Level 1    SPM 50    Minutes 15    METs 1      Prescription Details   Frequency (times per week) 2    Duration Progress to 30 minutes of continuous aerobic without signs/symptoms of physical distress      Intensity   THRR 40-80% of Max Heartrate 107-139    Ratings of Perceived Exertion 11-13    Perceived Dyspnea 0-4      Progression   Progression Continue to progress workloads to maintain intensity without signs/symptoms of physical distress.      Resistance Training   Training Prescription Yes    Weight 3 lb    Reps 10-15             Perform Capillary Blood Glucose checks as needed.  Exercise Prescription Changes:   Exercise Prescription Changes     Row Name 10/03/20 1700 10/17/20 0800 10/30/20 1500         Response to Exercise    Blood Pressure (Admit) 96/60 102/58 100/60     Blood Pressure (Exercise) 104/60 120/60 110/60     Blood Pressure (Exit) 100/62 98/64 108/64     Heart Rate (Admit) 76 bpm 76 bpm 78 bpm     Heart Rate (Exercise) 85 bpm 84 bpm 80 bpm     Heart Rate (Exit) 76 bpm 78 bpm 75 bpm     Oxygen Saturation (Admit) 97 % 95 % 95 %     Oxygen Saturation (Exercise) 94 % 95 % 93 %     Oxygen Saturation (Exit) 98 % 97 % 97 %     Rating of Perceived Exertion (Exercise) 11 13 11      Perceived Dyspnea (Exercise) 2 2 3      Symptoms SOB SOB SOB     Comments walk test results second full day of exercise --     Duration -- Progress to 30 minutes of  aerobic without signs/symptoms of physical distress Progress to 30 minutes of  aerobic without signs/symptoms of physical distress     Intensity -- THRR unchanged THRR unchanged           Progression       Progression -- Continue to progress workloads to maintain intensity without signs/symptoms of physical distress. Continue to progress workloads to maintain intensity without signs/symptoms of physical distress.     Average METs -- 1.2 2           Resistance Training       Training Prescription -- Yes Yes     Weight -- 3 lb 3 lb     Reps -- 10-15 10-15           Interval Training       Interval Training -- No No           Oxygen       Oxygen -- Continuous Continuous     Liters -- 2 2           NuStep       Level -- 1 4     Minutes -- 30  30     METs -- 1.2 2           REL-XR       Level -- 1 --     Minutes -- 15 --             Exercise Comments:   Exercise Comments     Row Name 10/14/20 1410           Exercise Comments First full day of exercise!  Patient was oriented to gym and equipment including functions, settings, policies, and procedures.  Patient's individual exercise prescription and treatment plan were reviewed.  All starting workloads were established based on the results of the 6 minute walk test done at initial orientation  visit.  The plan for exercise progression was also introduced and progression will be customized based on patient's performance and goals.                Exercise Goals and Review:   Exercise Goals     Row Name 10/03/20 1732             Exercise Goals   Increase Physical Activity Yes       Intervention Provide advice, education, support and counseling about physical activity/exercise needs.;Develop an individualized exercise prescription for aerobic and resistive training based on initial evaluation findings, risk stratification, comorbidities and participant's personal goals.       Expected Outcomes Short Term: Attend rehab on a regular basis to increase amount of physical activity.;Long Term: Add in home exercise to make exercise part of routine and to increase amount of physical activity.;Long Term: Exercising regularly at least 3-5 days a week.       Increase Strength and Stamina Yes       Intervention Provide advice, education, support and counseling about physical activity/exercise needs.;Develop an individualized exercise prescription for aerobic and resistive training based on initial evaluation findings, risk stratification, comorbidities and participant's personal goals.       Expected Outcomes Short Term: Increase workloads from initial exercise prescription for resistance, speed, and METs.;Short Term: Perform resistance training exercises routinely during rehab and add in resistance training at home;Long Term: Improve cardiorespiratory fitness, muscular endurance and strength as measured by increased METs and functional capacity (6MWT)       Able to understand and use rate of perceived exertion (RPE) scale Yes       Intervention Provide education and explanation on how to use RPE scale       Expected Outcomes Short Term: Able to use RPE daily in rehab to express subjective intensity level;Long Term:  Able to use RPE to guide intensity level when exercising independently        Able to understand and use Dyspnea scale Yes       Intervention Provide education and explanation on how to use Dyspnea scale       Expected Outcomes Short Term: Able to use Dyspnea scale daily in rehab to express subjective sense of shortness of breath during exertion;Long Term: Able to use Dyspnea scale to guide intensity level when exercising independently       Knowledge and understanding of Target Heart Rate Range (THRR) Yes       Intervention Provide education and explanation of THRR including how the numbers were predicted and where they are located for reference       Expected Outcomes Short Term: Able to state/look up THRR;Short Term: Able to use daily as guideline for intensity in rehab;Long Term: Able  to use THRR to govern intensity when exercising independently       Able to check pulse independently Yes       Intervention Provide education and demonstration on how to check pulse in carotid and radial arteries.;Review the importance of being able to check your own pulse for safety during independent exercise       Expected Outcomes Short Term: Able to explain why pulse checking is important during independent exercise;Long Term: Able to check pulse independently and accurately       Understanding of Exercise Prescription Yes       Intervention Provide education, explanation, and written materials on patient's individual exercise prescription       Expected Outcomes Short Term: Able to explain program exercise prescription;Long Term: Able to explain home exercise prescription to exercise independently                Exercise Goals Re-Evaluation :  Exercise Goals Re-Evaluation     Row Name 10/14/20 1410 10/17/20 0843 10/28/20 1418         Exercise Goal Re-Evaluation   Exercise Goals Review Able to understand and use rate of perceived exertion (RPE) scale;Understanding of Exercise Prescription;Knowledge and understanding of Target Heart Rate Range (THRR);Increase Physical  Activity;Increase Strength and Stamina;Able to understand and use Dyspnea scale;Able to check pulse independently Increase Physical Activity;Increase Strength and Stamina;Understanding of Exercise Prescription Increase Physical Activity;Increase Strength and Stamina     Comments Reviewed RPE and dyspnea scales, THR and program prescription with pt today.  Pt voiced understanding and was given a copy of goals to take home. Sigurd is off to a good start in rehab.  He has completed his first two full days of exercise.  He has been using his one prothesis to help with leg and arms on the machines.  We will continue to monitor his progress. Harol uses resistance bands at home about three times a week. He has yet to go over home exercise with the EP. He is willing to work hard and work at getting more mobile.     Expected Outcomes Short: Use RPE daily to regulate intensity. Long: Follow program prescription in THR. Short: Continue to attend rehab regularly Long; Continue to follow program prescription Short: meet with EP for home exercise. Long: maintain a home exercise routine.              Discharge Exercise Prescription (Final Exercise Prescription Changes):  Exercise Prescription Changes - 10/30/20 1500       Response to Exercise   Blood Pressure (Admit) 100/60    Blood Pressure (Exercise) 110/60    Blood Pressure (Exit) 108/64    Heart Rate (Admit) 78 bpm    Heart Rate (Exercise) 80 bpm    Heart Rate (Exit) 75 bpm    Oxygen Saturation (Admit) 95 %    Oxygen Saturation (Exercise) 93 %    Oxygen Saturation (Exit) 97 %    Rating of Perceived Exertion (Exercise) 11    Perceived Dyspnea (Exercise) 3    Symptoms SOB    Duration Progress to 30 minutes of  aerobic without signs/symptoms of physical distress    Intensity THRR unchanged      Progression   Progression Continue to progress workloads to maintain intensity without signs/symptoms of physical distress.    Average METs 2       Resistance Training   Training Prescription Yes    Weight 3 lb    Reps 10-15  Interval Training   Interval Training No      Oxygen   Oxygen Continuous    Liters 2      NuStep   Level 4    Minutes 30    METs 2             Nutrition:  Target Goals: Understanding of nutrition guidelines, daily intake of sodium 1500mg , cholesterol 200mg , calories 30% from fat and 7% or less from saturated fats, daily to have 5 or more servings of fruits and vegetables.  Education: All About Nutrition: -Group instruction provided by verbal, written material, interactive activities, discussions, models, and posters to present general guidelines for heart healthy nutrition including fat, fiber, MyPlate, the role of sodium in heart healthy nutrition, utilization of the nutrition label, and utilization of this knowledge for meal planning. Follow up email sent as well. Written material given at graduation.   Biometrics:  Pre Biometrics - 10/03/20 1728       Pre Biometrics   Height 6\' 1"  (1.854 m)   Verbal by patient; includes prosthetic leg   Weight 149 lb 9.6 oz (67.9 kg)   subtracted 43 lb from wheelchair and prosthetic leg   BMI (Calculated) 19.74              Nutrition Therapy Plan and Nutrition Goals:  Nutrition Therapy & Goals - 10/28/20 1334       Nutrition Therapy   Diet Heart healthy, low Na, pulmonary MNT    Drug/Food Interactions Statins/Certain Fruits    Protein (specify units) 80g    Fiber 30 grams    Whole Grain Foods 3 servings    Saturated Fats 12 max. grams    Fruits and Vegetables 8 servings/day    Sodium 1.5 grams      Personal Nutrition Goals   Nutrition Goal ST: eat more small, frequent meals. Try more non-starchy vegetables and have only 1/2 plate CHO instead of 3/4 plate CHO to help with breathing. LT: improve shortness of breath with meals and after meals    Comments B: bacon,sausages, or ham with eggs, biscuits, hashbrowns coffee (with cream) S: apple  D: meat (beef, chicken, salmon, pork) with can of mixed vegetable sor potato with butter. Salt to taste while cooking and before eating. Uses bacon grease or butter. Drinks: water, regular pepsi with dinner. loaf bread - white bread with all meals. Discussed heart healthy eaitng and pulmonary MNT. Pt not interested in making ay heart healthy changes, but is will to make some small changes like smaller, more frequent meals and lowering amounts of carbohydrates with meals to at least 1/2 plate instead of 3/4 plate.      Intervention Plan   Intervention Prescribe, educate and counsel regarding individualized specific dietary modifications aiming towards targeted core components such as weight, hypertension, lipid management, diabetes, heart failure and other comorbidities.;Nutrition handout(s) given to patient.    Expected Outcomes Short Term Goal: Understand basic principles of dietary content, such as calories, fat, sodium, cholesterol and nutrients.;Short Term Goal: A plan has been developed with personal nutrition goals set during dietitian appointment.;Long Term Goal: Adherence to prescribed nutrition plan.             Nutrition Assessments:  MEDIFICTS Score Key: ?70 Need to make dietary changes  40-70 Heart Healthy Diet ? 40 Therapeutic Level Cholesterol Diet  Flowsheet Row Pulmonary Rehab from 10/03/2020 in [facility] Cardiac and Pulmonary Rehab  Picture Your Plate Total Score on Admission 55  Picture Your Plate Scores: <81 Unhealthy dietary pattern with much room for improvement. 41-50 Dietary pattern unlikely to meet recommendations for good health and room for improvement. 51-60 More healthful dietary pattern, with some room for improvement.  >60 Healthy dietary pattern, although there may be some specific behaviors that could be improved.   Nutrition Goals Re-Evaluation:   Nutrition Goals Discharge (Final Nutrition Goals Re-Evaluation):   Psychosocial: Target Goals:  Acknowledge presence or absence of significant depression and/or stress, maximize coping skills, provide positive support system. Participant is able to verbalize types and ability to use techniques and skills needed for reducing stress and depression.   Education: Stress, Anxiety, and Depression - Group verbal and visual presentation to define topics covered.  Reviews how body is impacted by stress, anxiety, and depression.  Also discusses healthy ways to reduce stress and to treat/manage anxiety and depression.  Written material given at graduation.   Education: Sleep Hygiene -Provides group verbal and written instruction about how sleep can affect your health.  Define sleep hygiene, discuss sleep cycles and impact of sleep habits. Review good sleep hygiene tips.    Initial Review & Psychosocial Screening:  Initial Psych Review & Screening - 10/03/20 1550       Initial Review   Current issues with Current Psychotropic Meds;Current Stress Concerns    Source of Stress Concerns Family    Comments Harolds kids are not a source of his mental support. He can look to his wife and sisters for support.      Family Dynamics   Good Support System? Yes      Barriers   Psychosocial barriers to participate in program The patient should benefit from training in stress management and relaxation.      Screening Interventions   Interventions To provide support and resources with identified psychosocial needs;Provide feedback about the scores to participant;Encouraged to exercise    Expected Outcomes Short Term goal: Utilizing psychosocial counselor, staff and physician to assist with identification of specific Stressors or current issues interfering with healing process. Setting desired goal for each stressor or current issue identified.;Long Term Goal: Stressors or current issues are controlled or eliminated.;Short Term goal: Identification and review with participant of any Quality of Life or Depression  concerns found by scoring the questionnaire.;Long Term goal: The participant improves quality of Life and PHQ9 Scores as seen by post scores and/or verbalization of changes             Quality of Life Scores:  Scores of 19 and below usually indicate a poorer quality of life in these areas.  A difference of  2-3 points is a clinically meaningful difference.  A difference of 2-3 points in the total score of the Quality of Life Index has been associated with significant improvement in overall quality of life, self-image, physical symptoms, and general health in studies assessing change in quality of life.  PHQ-9: Recent Review Flowsheet Data     Depression screen [facility] 2/9 10/28/2020 10/03/2020 08/06/2020 04/05/2020 02/01/2020   Decreased Interest 0 1 0 0 0   Down, Depressed, Hopeless 0 0 0 0 0   PHQ - 2 Score 0 1 0 0 0   Altered sleeping 0 0 - - -   Tired, decreased energy 2 3 - - -   Change in appetite 0 1 - - -   Feeling bad or failure about yourself  0 1 - - -   Trouble concentrating 2 3 - - -  Moving slowly or fidgety/restless 0 0 - - -   Suicidal thoughts 0 0 - - -   PHQ-9 Score 4 9 - - -   Difficult doing work/chores Not difficult at all Very difficult - - -      Interpretation of Total Score  Total Score Depression Severity:  1-4 = Minimal depression, 5-9 = Mild depression, 10-14 = Moderate depression, 15-19 = Moderately severe depression, 20-27 = Severe depression   Psychosocial Evaluation and Intervention:  Psychosocial Evaluation - 10/03/20 1553       Psychosocial Evaluation & Interventions   Interventions Encouraged to exercise with the program and follow exercise prescription;Relaxation education;Stress management education    Comments Harolds kids are not a source of his mental support. He can look to his wife and sisters for support.    Expected Outcomes Short: Exercise regularly to support mental health and notify staff of any changes. Long: maintain mental health and  well being through teaching of rehab or prescribed medications independently.    Continue Psychosocial Services  Follow up required by staff             Psychosocial Re-Evaluation:  Psychosocial Re-Evaluation     [place] Name 10/28/20 1431             Psychosocial Re-Evaluation   Current issues with Current Psychotropic Meds       Comments Reviewed patient health questionnaire (PHQ-9) with patient for follow up. Previously, patients score indicated signs/symptoms of depression.  Reviewed to see if patient is improving symptom wise while in program.  Score improved and patient states that he just has weeks where he feels better than the other.       Expected Outcomes Short: Continue to attend [facility] regularly for regular exercise and social engagement. Long: Continue to improve symptoms and manage a positive mental state.       Interventions Encouraged to attend Pulmonary Rehabilitation for the exercise       Continue Psychosocial Services  Follow up required by staff                Psychosocial Discharge (Final Psychosocial Re-Evaluation):  Psychosocial Re-Evaluation - 10/28/20 1431       Psychosocial Re-Evaluation   Current issues with Current Psychotropic Meds    Comments Reviewed patient health questionnaire (PHQ-9) with patient for follow up. Previously, patients score indicated signs/symptoms of depression.  Reviewed to see if patient is improving symptom wise while in program.  Score improved and patient states that he just has weeks where he feels better than the other.    Expected Outcomes Short: Continue to attend [facility] regularly for regular exercise and social engagement. Long: Continue to improve symptoms and manage a positive mental state.    Interventions Encouraged to attend Pulmonary Rehabilitation for the exercise    Continue Psychosocial Services  Follow up required by staff             Education: Education Goals: Education classes will be provided  on a weekly basis, covering required topics. Participant will state understanding/return demonstration of topics presented.  Learning Barriers/Preferences:  Learning Barriers/Preferences - 10/03/20 1549       Learning Barriers/Preferences   Learning Barriers None    Learning Preferences None             General Pulmonary Education Topics:  Infection Prevention: - Provides verbal and written material to individual with discussion of infection control including proper hand washing and proper equipment cleaning during exercise  session. Flowsheet Row Pulmonary Rehab from 10/16/2020 in [facility] Cardiac and Pulmonary Rehab  Date 10/03/20  Educator [facility]  Instruction Review Code 1- Verbalizes Understanding       Falls Prevention: - Provides verbal and written material to individual with discussion of falls prevention and safety. Flowsheet Row Pulmonary Rehab from 10/16/2020 in [facility] Cardiac and Pulmonary Rehab  Date 10/03/20  Educator [facility]  Instruction Review Code 1- Verbalizes Understanding       Chronic Lung Disease Review: - Group verbal instruction with posters, models, PowerPoint presentations and videos,  to review new updates, new respiratory medications, new advancements in procedures and treatments. Providing information on websites and "800" numbers for continued self-education. Includes information about supplement oxygen, available portable oxygen systems, continuous and intermittent flow rates, oxygen safety, concentrators, and Medicare reimbursement for oxygen. Explanation of Pulmonary Drugs, including class, frequency, complications, importance of spacers, rinsing mouth after steroid MDI's, and proper cleaning methods for nebulizers. Review of basic lung anatomy and physiology related to function, structure, and complications of lung disease. Review of risk factors. Discussion about methods for diagnosing sleep apnea and types of masks and machines for OSA. Includes a review of the  use of types of environmental controls: home humidity, furnaces, filters, dust mite/pet prevention, HEPA vacuums. Discussion about weather changes, air quality and the benefits of nasal washing. Instruction on Warning signs, infection symptoms, calling MD promptly, preventive modes, and value of vaccinations. Review of effective airway clearance, coughing and/or vibration techniques. Emphasizing that all should Create an Action Plan. Written material given at graduation.   AED/CPR: - Group verbal and written instruction with the use of models to demonstrate the basic use of the AED with the basic ABC's of resuscitation.    Anatomy and Cardiac Procedures: - Group verbal and visual presentation and models provide information about basic cardiac anatomy and function. Reviews the testing methods done to diagnose heart disease and the outcomes of the test results. Describes the treatment choices: Medical Management, Angioplasty, or Coronary Bypass Surgery for treating various heart conditions including Myocardial Infarction, Angina, Valve Disease, and Cardiac Arrhythmias.  Written material given at graduation.   Medication Safety: - Group verbal and visual instruction to review commonly prescribed medications for heart and lung disease. Reviews the medication, class of the drug, and side effects. Includes the steps to properly store meds and maintain the prescription regimen.  Written material given at graduation.   Other: -Provides group and verbal instruction on various topics (see comments)   Knowledge Questionnaire Score:  Knowledge Questionnaire Score - 10/03/20 1740       Knowledge Questionnaire Score   Pre Score 13/18: Oxygen, HR              Core Components/Risk Factors/Patient Goals at Admission:  Personal Goals and Risk Factors at Admission - 10/03/20 1549       Core Components/Risk Factors/Patient Goals on Admission    Weight Management Yes;Weight Maintenance   Patient  reports maintain weight, despite BMI   Intervention Weight Management: Develop a combined nutrition and exercise program designed to reach desired caloric intake, while maintaining appropriate intake of nutrient and fiber, sodium and fats, and appropriate energy expenditure required for the weight goal.;Weight Management: Provide education and appropriate resources to help participant work on and attain dietary goals.;Weight Management/Obesity: Establish reasonable short term and long term weight goals.    Admit Weight 149 lb (67.6 kg)    Goal Weight: Short Term 149 lb (67.6 kg)    Goal  Weight: Long Term 149 lb (67.6 kg)    Expected Outcomes Short Term: Continue to assess and modify interventions until short term weight is achieved;Long Term: Adherence to nutrition and physical activity/exercise program aimed toward attainment of established weight goal;Weight Maintenance: Understanding of the daily nutrition guidelines, which includes 25-35% calories from fat, 7% or less cal from saturated fats, less than 200mg  cholesterol, less than 1.5gm of sodium, & 5 or more servings of fruits and vegetables daily;Understanding recommendations for meals to include 15-35% energy as protein, 25-35% energy from fat, 35-60% energy from carbohydrates, less than 200mg  of dietary cholesterol, 20-35 gm of total fiber daily;Understanding of distribution of calorie intake throughout the day with the consumption of 4-5 meals/snacks    Improve shortness of breath with ADL's Yes    Intervention Provide education, individualized exercise plan and daily activity instruction to help decrease symptoms of SOB with activities of daily living.    Expected Outcomes Short Term: Improve cardiorespiratory fitness to achieve a reduction of symptoms when performing ADLs;Long Term: Be able to perform more ADLs without symptoms or delay the onset of symptoms    Heart Failure Yes    Intervention Provide a combined exercise and nutrition program  that is supplemented with education, support and counseling about heart failure. Directed toward relieving symptoms such as shortness of breath, decreased exercise tolerance, and extremity edema.    Expected Outcomes Improve functional capacity of life;Short term: Attendance in program 2-3 days a week with increased exercise capacity. Reported lower sodium intake. Reported increased fruit and vegetable intake. Reports medication compliance.;Short term: Daily weights obtained and reported for increase. Utilizing diuretic protocols set by physician.;Long term: Adoption of self-care skills and reduction of barriers for early signs and symptoms recognition and intervention leading to self-care maintenance.    Hypertension Yes    Intervention Provide education on lifestyle modifcations including regular physical activity/exercise, weight management, moderate sodium restriction and increased consumption of fresh fruit, vegetables, and low fat dairy, alcohol moderation, and smoking cessation.;Monitor prescription use compliance.    Expected Outcomes Short Term: Continued assessment and intervention until BP is < 140/55mm HG in hypertensive participants. < 130/1mm HG in hypertensive participants with diabetes, heart failure or chronic kidney disease.;Long Term: Maintenance of blood pressure at goal levels.    Lipids Yes    Intervention Provide education and support for participant on nutrition & aerobic/resistive exercise along with prescribed medications to achieve LDL 70mg , HDL >40mg .    Expected Outcomes Short Term: Participant states understanding of desired cholesterol values and is compliant with medications prescribed. Participant is following exercise prescription and nutrition guidelines.;Long Term: Cholesterol controlled with medications as prescribed, with individualized exercise RX and with personalized nutrition plan. Value goals: LDL < 70mg , HDL > 40 mg.             Education:Diabetes -  Individual verbal and written instruction to review signs/symptoms of diabetes, desired ranges of glucose level fasting, after meals and with exercise. Acknowledge that pre and post exercise glucose checks will be done for 3 sessions at entry of program.   Know Your Numbers and Heart Failure: - Group verbal and visual instruction to discuss disease risk factors for cardiac and pulmonary disease and treatment options.  Reviews associated critical values for Overweight/Obesity, Hypertension, Cholesterol, and Diabetes.  Discusses basics of heart failure: signs/symptoms and treatments.  Introduces Heart Failure Zone chart for action plan for heart failure.  Written material given at graduation. Flowsheet Row Pulmonary Rehab from 10/16/2020 in [facility] Cardiac  and Pulmonary Rehab  Date 10/16/20  Educator [facility]  Instruction Review Code 1- Verbalizes Understanding       Core Components/Risk Factors/Patient Goals Review:   Goals and Risk Factor Review     Row Name 10/28/20 1426             Core Components/Risk Factors/Patient Goals Review   Personal Goals Review Improve shortness of breath with ADL's       Review Spoke to patient about their shortness of breath and what they can do to improve. Patient has been informed of breathing techniques when starting the program. Patient is informed to tell staff if they have had any med changes and that certain meds they are taking or not taking can be causing shortness of breath.       Expected Outcomes Short: Attend [facility] regularly to improve shortness of breath with ADL's. Long: maintain independence with ADL's                Core Components/Risk Factors/Patient Goals at Discharge (Final Review):   Goals and Risk Factor Review - 10/28/20 1426       Core Components/Risk Factors/Patient Goals Review   Personal Goals Review Improve shortness of breath with ADL's    Review Spoke to patient about their shortness of breath and what they can do to  improve. Patient has been informed of breathing techniques when starting the program. Patient is informed to tell staff if they have had any med changes and that certain meds they are taking or not taking can be causing shortness of breath.    Expected Outcomes Short: Attend [facility] regularly to improve shortness of breath with ADL's. Long: maintain independence with ADL's             ITP Comments:  ITP Comments     Row Name 10/03/20 1705 10/09/20 0722 10/14/20 1410 11/06/20 0818     ITP Comments Completed 6 minute step test and gym orientation. Initial ITP created and sent for review to Dr. Emily Filbert, Medical Director. 30 Day review completed. Medical Director ITP review done, changes made as directed, and signed approval by Medical Director.   0 visits in May First full day of exercise!  Patient was oriented to gym and equipment including functions, settings, policies, and procedures.  Patient's individual exercise prescription and treatment plan were reviewed.  All starting workloads were established based on the results of the 6 minute walk test done at initial orientation visit.  The plan for exercise progression was also introduced and progression will be customized based on patient's performance and goals. 30 Day review completed. Medical Director ITP review done, changes made as directed, and signed approval by Medical Director.             Comments:

## 2020-11-11 ENCOUNTER — Telehealth: Payer: Self-pay

## 2020-11-11 DIAGNOSIS — I482 Chronic atrial fibrillation, unspecified: Secondary | ICD-10-CM | POA: Diagnosis not present

## 2020-11-11 DIAGNOSIS — J449 Chronic obstructive pulmonary disease, unspecified: Secondary | ICD-10-CM

## 2020-11-11 NOTE — Telephone Encounter (Signed)
Patient called and wished to be discharged from Pulmonary Rehab at this time. No longer interested in the program.

## 2020-11-11 NOTE — Progress Notes (Signed)
Discharge Progress Report  Patient Details  Name: Tyler [name] MRN: [id number] Date of Birth: 1956/03/12 Referring Provider:   Flowsheet Row Pulmonary Rehab from 10/03/2020 in [facility]  Referring Provider Ottie Glazier MD        Number of Visits: 5  Reason for Discharge:  Early Exit:  Personal  Smoking History:  Social History   Tobacco Use  Smoking Status Former   Packs/day: 1.00   Years: 42.00   Pack years: 42.00   Types: Cigarettes   Quit date: 09/23/2013   Years since quitting: 7.1  Smokeless Tobacco Never    Diagnosis:  Chronic obstructive pulmonary disease, unspecified COPD type ([place])  ADL [facility]:  Pulmonary Assessment Scores     Row Name 10/03/20 1708         ADL [facility]   ADL Phase Entry     SOB Score total 66     Rest 0     Walk 4     Stairs 5     Bath 2     Dress 1     Shop 4           CAT Score     CAT Score 21           mMRC Score     mMRC Score 4             Initial Exercise Prescription:  Initial Exercise Prescription - 10/03/20 1700       Date of Initial Exercise RX and Referring Provider   Date 10/03/20    Referring Provider Ottie Glazier MD      Oxygen   Oxygen Continuous    Liters 2      NuStep   Level 1    SPM 80    Minutes 15    METs 1      Arm Ergometer   Level 1    RPM 30    Minutes 15    METs 1      Biostep-RELP   Level 1    SPM 50    Minutes 15    METs 1      Prescription Details   Frequency (times per week) 2    Duration Progress to 30 minutes of continuous aerobic without signs/symptoms of physical distress      Intensity   THRR 40-80% of Max Heartrate 107-139    Ratings of Perceived Exertion 11-13    Perceived Dyspnea 0-4      Progression   Progression Continue to progress workloads to maintain intensity without signs/symptoms of physical distress.      Resistance Training   Training Prescription Yes    Weight 3 lb    Reps 10-15             Discharge  Exercise Prescription (Final Exercise Prescription Changes):  Exercise Prescription Changes - 10/30/20 1500       Response to Exercise   Blood Pressure (Admit) 100/60    Blood Pressure (Exercise) 110/60    Blood Pressure (Exit) 108/64    Heart Rate (Admit) 78 bpm    Heart Rate (Exercise) 80 bpm    Heart Rate (Exit) 75 bpm    Oxygen Saturation (Admit) 95 %    Oxygen Saturation (Exercise) 93 %    Oxygen Saturation (Exit) 97 %    Rating of Perceived Exertion (Exercise) 11    Perceived Dyspnea (Exercise) 3    Symptoms SOB    Duration  Progress to 30 minutes of  aerobic without signs/symptoms of physical distress    Intensity THRR unchanged      Progression   Progression Continue to progress workloads to maintain intensity without signs/symptoms of physical distress.    Average METs 2      Resistance Training   Training Prescription Yes    Weight 3 lb    Reps 10-15      Interval Training   Interval Training No      Oxygen   Oxygen Continuous    Liters 2      NuStep   Level 4    Minutes 30    METs 2             Functional Capacity:  6 Minute Walk     Row Name 10/03/20 1730         6 Minute Walk   Phase Initial     Distance 246 feet  Step test (total # of steps); wheelchair bound     Walk Time 5.2 minutes     # of Rest Breaks 1     MPH --  N/A     METS 1.08     RPE 11     Perceived Dyspnea  2     VO2 Peak 3.8     Symptoms Yes (comment)     Comments SOB     Resting HR 76 bpm     Resting BP 96/60     Resting Oxygen Saturation  97 %     Exercise Oxygen Saturation  during 6 min walk 94 %     Max Ex. HR 85 bpm     Max Ex. BP 104/60     2 Minute Post BP 100/62           Interval HR     1 Minute HR 81     2 Minute HR 82     3 Minute HR 85     4 Minute HR 83     5 Minute HR 82     6 Minute HR 80     2 Minute Post HR 76     Interval Heart Rate? Yes           Interval Oxygen     Interval Oxygen? Yes     Baseline Oxygen Saturation % 97 %     1  Minute Oxygen Saturation % 97 %     1 Minute Liters of Oxygen 2 L     2 Minute Oxygen Saturation % 98 %     2 Minute Liters of Oxygen 2 L     3 Minute Oxygen Saturation % 95 %     3 Minute Liters of Oxygen 2 L     4 Minute Oxygen Saturation % 94 %     4 Minute Liters of Oxygen 2 L     5 Minute Oxygen Saturation % 96 %     5 Minute Liters of Oxygen 2 L     6 Minute Oxygen Saturation % 97 %     6 Minute Liters of Oxygen 2 L     2 Minute Post Oxygen Saturation % 98 %     2 Minute Post Liters of Oxygen 2 L             Psychological, QOL, Others - Outcomes: PHQ 2/9: Depression screen [facility] 2/9 10/28/2020 10/03/2020 08/06/2020 04/05/2020 02/01/2020  Decreased Interest 0 1 0 0  0  Down, Depressed, Hopeless 0 0 0 0 0  PHQ - 2 Score 0 1 0 0 0  Altered sleeping 0 0 - - -  Tired, decreased energy 2 3 - - -  Change in appetite 0 1 - - -  Feeling bad or failure about yourself  0 1 - - -  Trouble concentrating 2 3 - - -  Moving slowly or fidgety/restless 0 0 - - -  Suicidal thoughts 0 0 - - -  PHQ-9 Score 4 9 - - -  Difficult doing work/chores Not difficult at all Very difficult - - -  Some recent data might be hidden       Nutrition & Weight - Outcomes:  Pre Biometrics - 10/03/20 1728       Pre Biometrics   Height 6\' 1"  (1.854 m)   Verbal by patient; includes prosthetic leg   Weight 149 lb 9.6 oz (67.9 kg)   subtracted 43 lb from wheelchair and prosthetic leg   BMI (Calculated) 19.74              Nutrition:  Nutrition Therapy & Goals - 10/28/20 1334       Nutrition Therapy   Diet Heart healthy, low Na, pulmonary MNT    Drug/Food Interactions Statins/Certain Fruits    Protein (specify units) 80g    Fiber 30 grams    Whole Grain Foods 3 servings    Saturated Fats 12 max. grams    Fruits and Vegetables 8 servings/day    Sodium 1.5 grams      Personal Nutrition Goals   Nutrition Goal ST: eat more small, frequent meals. Try more non-starchy vegetables and have only 1/2  plate CHO instead of 3/4 plate CHO to help with breathing. LT: improve shortness of breath with meals and after meals    Comments B: bacon,sausages, or ham with eggs, biscuits, hashbrowns coffee (with cream) S: apple D: meat (beef, chicken, salmon, pork) with can of mixed vegetable sor potato with butter. Salt to taste while cooking and before eating. Uses bacon grease or butter. Drinks: water, regular pepsi with dinner. loaf bread - white bread with all meals. Discussed heart healthy eaitng and pulmonary MNT. Pt not interested in making ay heart healthy changes, but is will to make some small changes like smaller, more frequent meals and lowering amounts of carbohydrates with meals to at least 1/2 plate instead of 3/4 plate.      Intervention Plan   Intervention Prescribe, educate and counsel regarding individualized specific dietary modifications aiming towards targeted core components such as weight, hypertension, lipid management, diabetes, heart failure and other comorbidities.;Nutrition handout(s) given to patient.    Expected Outcomes Short Term Goal: Understand basic principles of dietary content, such as calories, fat, sodium, cholesterol and nutrients.;Short Term Goal: A plan has been developed with personal nutrition goals set during dietitian appointment.;Long Term Goal: Adherence to prescribed nutrition plan.               Goals reviewed with patient; copy given to patient.

## 2020-11-11 NOTE — Progress Notes (Signed)
Pulmonary Individual Treatment Plan  Patient Details  Name: Tyler [name] MRN: [id number] Date of Birth: [birth year] Referring Provider:   Flowsheet Row Pulmonary Rehab from 10/03/2020 in [facility] Cardiac and Pulmonary Rehab  Referring Provider Ottie Glazier MD       Initial Encounter Date:  Flowsheet Row Pulmonary Rehab from 10/03/2020 in [facility] Cardiac and Pulmonary Rehab  Date 10/03/20       Visit Diagnosis: Chronic obstructive pulmonary disease, unspecified COPD type ([place])  Patient's Home Medications on Admission:  Current Outpatient Medications:    acetaminophen (TYLENOL) 500 MG tablet, Take 1,000 mg by mouth daily as needed for moderate pain or headache., Disp: , Rfl:    ALPRAZolam (XANAX) 1 MG tablet, Take 1 mg by mouth 2 (two) times daily as needed for anxiety or sleep. , Disp: , Rfl:    aspirin EC 81 MG tablet, Take 1 tablet (81 mg total) by mouth daily. (Patient taking differently: Take 81 mg by mouth daily after supper.), Disp: 150 tablet, Rfl: 2   azithromycin (ZITHROMAX) 250 MG tablet, Take 250 mg by mouth daily. Monday, Wednesday, Friday for 90 days, Disp: , Rfl:    budesonide (PULMICORT) 0.25 MG/2ML nebulizer solution, Inhale into the lungs., Disp: , Rfl:    citalopram (CELEXA) 10 MG tablet, Take 10 mg by mouth daily., Disp: , Rfl:    docusate sodium (COLACE) 100 MG capsule, Take 100 mg by mouth daily as needed (for constipation.). , Disp: , Rfl:    Dupilumab (DUPIXENT) 300 MG/2ML SOPN, Inject 300 mg into the skin every 14 (fourteen) days. Inject 2 mLs (300mg )., Disp: , Rfl:    ENTRESTO 24-26 MG, Take 1 tablet by mouth 2 (two) times daily., Disp: 60 tablet, Rfl: 0   EPINEPHrine 0.3 mg/0.3 mL IJ SOAJ injection, Inject into the muscle., Disp: , Rfl:    fluticasone (FLONASE) 50 MCG/ACT nasal spray, Place 2 sprays into both nostrils daily., Disp: , Rfl:    fluticasone-salmeterol (ADVAIR HFA) 115-21 MCG/ACT inhaler, USE 2 INHALATIONS ORALLY EVERY 12 HOURS (Patient taking  differently: Inhale 2 puffs into the lungs every 12 (twelve) hours.), Disp: 36 Inhaler, Rfl: 5   furosemide (LASIX) 20 MG tablet, Take 2 tablets (40 mg total) by mouth daily. (Patient taking differently: Take 20 mg by mouth daily.), Disp: 60 tablet, Rfl: 0   furosemide (LASIX) 20 MG tablet, Take 20 mg by mouth. (Patient not taking: No sig reported), Disp: , Rfl:    gabapentin (NEURONTIN) 100 MG capsule, Take 300 mg by mouth in the morning, at noon, and at bedtime., Disp: , Rfl:    Gauze Pads & Dressings (RESTORE FOAM DRESSING) 6"X8" PADS, by Does not apply route. (Patient not taking: No sig reported), Disp: , Rfl:    guaiFENesin (MUCINEX) 600 MG 12 hr tablet, Take 600 mg by mouth every evening. , Disp: , Rfl:    ipratropium (ATROVENT) 0.03 % nasal spray, Place 2 sprays into both nostrils 2 (two) times daily. , Disp: , Rfl:    Ipratropium-Albuterol (COMBIVENT) 20-100 MCG/ACT AERS respimat, Inhale 1 puff into the lungs every 4 (four) hours., Disp: , Rfl:    ipratropium-albuterol (DUONEB) 0.5-2.5 (3) MG/3ML SOLN, Inhale into the lungs., Disp: , Rfl:    JARDIANCE 10 MG TABS tablet, Take 10 mg by mouth daily with breakfast., Disp: , Rfl:    levalbuterol (XOPENEX) 1.25 MG/3ML nebulizer solution, Take 1.25 mg (3 mLs total) by nebulization every 4 (four) hours. (Patient not taking: No sig reported), Disp: 72 mL,  Rfl: 12   loratadine (CLARITIN) 10 MG tablet, Take 10 mg by mouth at bedtime. (Patient not taking: No sig reported), Disp: , Rfl:    lovastatin (MEVACOR) 20 MG tablet, Take 20 mg by mouth at bedtime. (Patient not taking: No sig reported), Disp: , Rfl:    lovastatin (MEVACOR) 20 MG tablet, Take 1 tablet by mouth daily., Disp: , Rfl:    metoprolol succinate (TOPROL-XL) 25 MG 24 hr tablet, Take 25 mg by mouth daily., Disp: , Rfl:    montelukast (SINGULAIR) 10 MG tablet, Take 10 mg by mouth at bedtime., Disp: , Rfl:    mupirocin ointment (BACTROBAN) 2 %, Apply 1 application topically daily. (Patient not  taking: No sig reported), Disp: 22 g, Rfl: 0   nitroGLYCERIN (NITROSTAT) 0.4 MG SL tablet, Place 0.4 mg under the tongue every 5 (five) minutes as needed for chest pain., Disp: , Rfl:    oxyCODONE-acetaminophen (PERCOCET) 7.5-325 MG tablet, Take 1-2 tablets by mouth every 6 (six) hours as needed for moderate pain., Disp: 50 tablet, Rfl: 0   oxyCODONE-acetaminophen (PERCOCET) 7.5-325 MG tablet, Take by mouth. (Patient not taking: No sig reported), Disp: , Rfl:    pantoprazole (PROTONIX) 40 MG tablet, Take 40 mg by mouth daily before breakfast. , Disp: , Rfl:    predniSONE (DELTASONE) 20 MG tablet, Take 20 mg by mouth daily., Disp: , Rfl:    Respiratory Therapy Supplies (FLUTTER) DEVI, Use as directed., Disp: 1 each, Rfl: 0   roflumilast (DALIRESP) 500 MCG TABS tablet, Take 250 mcg by mouth daily. (Patient not taking: No sig reported), Disp: , Rfl:    sildenafil (VIAGRA) 25 MG tablet, Take by mouth., Disp: , Rfl:    SPIRIVA RESPIMAT 1.25 MCG/ACT AERS, INHALE 2 PUFFS BY MOUTH INTO THE LUNGS DAILY (Patient not taking: No sig reported), Disp: 4 g, Rfl: 6   tamsulosin (FLOMAX) 0.4 MG CAPS capsule, Take 0.4 mg by mouth daily after supper. , Disp: , Rfl: 11   vitamin B-12 (CYANOCOBALAMIN) 1000 MCG tablet, Take 1,000 mcg by mouth daily., Disp: , Rfl:    warfarin (COUMADIN) 5 MG tablet, Take 5 mg by mouth daily., Disp: , Rfl:    Wound Dressings (RESTORE WOUND CARE DRESSING) PADS, Apply 1 each topically daily.  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   Atrial fibrillation (HCC)    Cervical spinal stenosis    with neuropathy   CHF (congestive heart failure) (HCC)    Constipation    COPD (chronic obstructive  pulmonary disease) (HCC)    Coronary artery disease    Depression    Dyspnea    Dysrhythmia    atrial fibrillation   Emphysema of lung (HCC)    Factor 5 Leiden mutation, heterozygous ([place])    on coumadin   Factor V Leiden mutation ([place])    GERD (gastroesophageal reflux disease)    Hypertension    Lung nodule seen on imaging study    being followed by dr. [name]. just watching it for last few years, without change   Mitral valve insufficiency    Moderate tricuspid insufficiency    Myocardial infarction [facility]) 2004   stent placed, pacemaker implanted 2005  Osteomyelitis ([place]) 10/2019   left foot   Oxygen dependent    requires 2L nasal prong oxygen 24 hours a day   Peripheral vascular disease ([place])    Presence of permanent cardiac pacemaker [phone]   PVD (peripheral vascular disease) ([place])    Sleep apnea    waiting to have sleep study. Used bipap while hospitalized and said it was great for him.    Tobacco Use: Social History   Tobacco Use  Smoking Status Former   Packs/day: 1.00   Years: 42.00   Pack years: 42.00   Types: Cigarettes   Quit date: 09/23/2013   Years since quitting: 7.1  Smokeless Tobacco Never    Labs: Recent Review Flowsheet Data     Labs for ITP Cardiac and Pulmonary Rehab Latest Ref Rng & Units 10/13/2016 10/16/2016 05/05/2017 06/22/2017 10/25/2019   PHART 7.350 - 7.450 - 7.38 7.43 - -   PCO2ART 32.0 - 48.0 mmHg - 46 38 - -   HCO3 20.0 - 28.0 mmol/L 25.2 27.2 25.2 29.4(H) 31.0(H)   ACIDBASEDEF 0.0 - 2.0 mmol/L 4.4(H) - - - -   O2SAT % - 95.9 97.4 - 72.1        Pulmonary Assessment Scores:  Pulmonary Assessment Scores     Row Name 10/03/20 1708         ADL UCSD   ADL Phase Entry     SOB Score total 66     Rest 0     Walk 4     Stairs 5     Bath 2     Dress 1     Shop 4           CAT Score     CAT Score 21           mMRC Score     mMRC Score 4             UCSD: Self-administered rating of dyspnea associated with activities  of daily living (ADLs) 6-point scale (0 = "not at all" to 5 = "maximal or unable to do because of breathlessness")  Scoring Scores range from 0 to 120.  Minimally important difference is 5 units  CAT: CAT can identify the health impairment of COPD patients and is better correlated with disease progression.  CAT has a scoring range of zero to 40. The CAT score is classified into four groups of low (less than 10), medium (10 - 20), high (21-30) and very high (31-40) based on the impact level of disease on health status. A CAT score over 10 suggests significant symptoms.  A worsening CAT score could be explained by an exacerbation, poor medication adherence, poor inhaler technique, or progression of COPD or comorbid conditions.  CAT MCID is 2 points  mMRC: mMRC (Modified Medical Research Council) Dyspnea Scale is used to assess the degree of baseline functional disability in patients of respiratory disease due to dyspnea. No minimal important difference is established. A decrease in score of 1 point or greater is considered a positive change.   Pulmonary Function Assessment:  Pulmonary Function Assessment - 10/03/20 1548       Pulmonary Function Tests   FVC% 73.1 %    FEV1% 30.6 %    FEV1/FVC Ratio 32      Post Bronchodilator Spirometry Results   FVC% --    FEV1% --    FEV1/FVC Ratio --      Breath   Bilateral Breath Sounds Clear;Decreased  Shortness of Breath Yes;Fear of Shortness of Breath;Panic with Shortness of Breath;Limiting activity             Exercise Target Goals: Exercise Program Goal: Individual exercise prescription set using results from initial 6 min walk test and THRR while considering  patient's activity barriers and safety.   Exercise Prescription Goal: Initial exercise prescription builds to 30-45 minutes a day of aerobic activity, 2-3 days per week.  Home exercise guidelines will be given to patient during program as part of exercise prescription that the  participant will acknowledge.  Education: Aerobic Exercise: - Group verbal and visual presentation on the components of exercise prescription. Introduces F.I.T.T principle from ACSM for exercise prescriptions.  Reviews F.I.T.T. principles of aerobic exercise including progression. Written material given at graduation.   Education: Resistance Exercise: - Group verbal and visual presentation on the components of exercise prescription. Introduces F.I.T.T principle from ACSM for exercise prescriptions  Reviews F.I.T.T. principles of resistance exercise including progression. Written material given at graduation.    Education: Exercise & Equipment Safety: - Individual verbal instruction and demonstration of equipment use and safety with use of the equipment. Flowsheet Row Pulmonary Rehab from 10/16/2020 in [facility] Cardiac and Pulmonary Rehab  Date 10/03/20  Educator [facility]  Instruction Review Code 1- Verbalizes Understanding       Education: Exercise Physiology & General Exercise Guidelines: - Group verbal and written instruction with models to review the exercise physiology of the cardiovascular system and associated critical values. Provides general exercise guidelines with specific guidelines to those with heart or lung disease.    Education: Flexibility, Balance, Mind/Body Relaxation: - Group verbal and visual presentation with interactive activity on the components of exercise prescription. Introduces F.I.T.T principle from ACSM for exercise prescriptions. Reviews F.I.T.T. principles of flexibility and balance exercise training including progression. Also discusses the mind body connection.  Reviews various relaxation techniques to help reduce and manage stress (i.e. Deep breathing, progressive muscle relaxation, and visualization). Balance handout provided to take home. Written material given at graduation.   Activity Barriers & Risk Stratification:  Activity Barriers & Cardiac Risk  Stratification - 10/03/20 1729       Activity Barriers & Cardiac Risk Stratification   Activity Barriers Shortness of Breath;Assistive Device;Decreased Ventricular Function;Neck/Spine Problems;Muscular Weakness;Deconditioning;Balance Concerns;Other (comment)    Comments Double amputee/ prosthetic leg             6 Minute Walk:  6 Minute Walk     Row Name 10/03/20 1730         6 Minute Walk   Phase Initial     Distance 246 feet  Step test (total # of steps); wheelchair bound     Walk Time 5.2 minutes     # of Rest Breaks 1     MPH --  N/A     METS 1.08     RPE 11     Perceived Dyspnea  2     VO2 Peak 3.8     Symptoms Yes (comment)     Comments SOB     Resting HR 76 bpm     Resting BP 96/60     Resting Oxygen Saturation  97 %     Exercise Oxygen Saturation  during 6 min walk 94 %     Max Ex. HR 85 bpm     Max Ex. BP 104/60     2 Minute Post BP 100/62           Interval HR  1 Minute HR 81     2 Minute HR 82     3 Minute HR 85     4 Minute HR 83     5 Minute HR 82     6 Minute HR 80     2 Minute Post HR 76     Interval Heart Rate? Yes           Interval Oxygen     Interval Oxygen? Yes     Baseline Oxygen Saturation % 97 %     1 Minute Oxygen Saturation % 97 %     1 Minute Liters of Oxygen 2 L     2 Minute Oxygen Saturation % 98 %     2 Minute Liters of Oxygen 2 L     3 Minute Oxygen Saturation % 95 %     3 Minute Liters of Oxygen 2 L     4 Minute Oxygen Saturation % 94 %     4 Minute Liters of Oxygen 2 L     5 Minute Oxygen Saturation % 96 %     5 Minute Liters of Oxygen 2 L     6 Minute Oxygen Saturation % 97 %     6 Minute Liters of Oxygen 2 L     2 Minute Post Oxygen Saturation % 98 %     2 Minute Post Liters of Oxygen 2 L            Oxygen Initial Assessment:  Oxygen Initial Assessment - 10/03/20 1708       Home Oxygen   Home Oxygen Device Home Concentrator;E-Tanks    Sleep Oxygen Prescription Continuous;BiPAP;CPAP    Liters per  minute 2    Home Exercise Oxygen Prescription Continuous    Liters per minute 2    Home Resting Oxygen Prescription Continuous    Liters per minute 2    Compliance with Home Oxygen Use Yes      Initial 6 min Walk   Oxygen Used Continuous;E-Tanks    Liters per minute 2      Program Oxygen Prescription   Program Oxygen Prescription Continuous;E-Tanks    Liters per minute 2      Intervention   Short Term Goals To learn and exhibit compliance with exercise, home and travel O2 prescription;To learn and understand importance of maintaining oxygen saturations>88%;To learn and understand importance of monitoring SPO2 with pulse oximeter and demonstrate accurate use of the pulse oximeter.;To learn and demonstrate proper pursed lip breathing techniques or other breathing techniques. ;To learn and demonstrate proper use of respiratory medications    Long  Term Goals Exhibits compliance with exercise, home  and travel O2 prescription;Verbalizes importance of monitoring SPO2 with pulse oximeter and return demonstration;Maintenance of O2 saturations>88%;Exhibits proper breathing techniques, such as pursed lip breathing or other method taught during program session;Compliance with respiratory medication;Demonstrates proper use of MDI's             Oxygen Re-Evaluation:  Oxygen Re-Evaluation     Row Name 10/14/20 1411 10/28/20 1423           Program Oxygen Prescription   Program Oxygen Prescription Continuous;E-Tanks Continuous      Liters per minute 2 2             Home Oxygen      Home Oxygen Device Home Concentrator;E-Tanks Home Concentrator;E-Tanks      Sleep Oxygen Prescription Continuous;BiPAP;CPAP CPAP;BiPAP;Continuous      Liters per  minute 2 2      Home Exercise Oxygen Prescription Continuous Continuous      Liters per minute 2 2      Home Resting Oxygen Prescription Continuous Continuous      Liters per minute 2 2      Compliance with Home Oxygen Use Yes Yes              Goals/Expected Outcomes      Short Term Goals To learn and exhibit compliance with exercise, home and travel O2 prescription;To learn and understand importance of maintaining oxygen saturations>88%;To learn and understand importance of monitoring SPO2 with pulse oximeter and demonstrate accurate use of the pulse oximeter.;To learn and demonstrate proper pursed lip breathing techniques or other breathing techniques.  To learn and exhibit compliance with exercise, home and travel O2 prescription;Other      Long  Term Goals Exhibits compliance with exercise, home  and travel O2 prescription;Verbalizes importance of monitoring SPO2 with pulse oximeter and return demonstration;Maintenance of O2 saturations>88%;Exhibits proper breathing techniques, such as pursed lip breathing or other method taught during program session Exhibits compliance with exercise, home  and travel O2 prescription;Other      Comments Reviewed PLB technique with pt.  Talked about how it works and it's importance in maintaining their exercise saturations. Anibal states that he sleeps with a Ventilator. He uses a Trilogy Vent but is not sure what his settings are. Imformed him to take a picture of his home ventilator screen so he can know if he is using it as a CPAP, BIPAP or other. He state that he wears his device everynight for sleep.      Goals/Expected Outcomes Short: Become more profiecient at using PLB.   Long: Become independent at using PLB. Short: inform staff of sleeping mode. Long: maintain ventilator independently              Oxygen Discharge (Final Oxygen Re-Evaluation):  Oxygen Re-Evaluation - 10/28/20 1423       Program Oxygen Prescription   Program Oxygen Prescription Continuous    Liters per minute 2      Home Oxygen   Home Oxygen Device Home Concentrator;E-Tanks    Sleep Oxygen Prescription CPAP;BiPAP;Continuous    Liters per minute 2    Home Exercise Oxygen Prescription Continuous    Liters per minute 2     Home Resting Oxygen Prescription Continuous    Liters per minute 2    Compliance with Home Oxygen Use Yes      Goals/Expected Outcomes   Short Term Goals To learn and exhibit compliance with exercise, home and travel O2 prescription;Other    Long  Term Goals Exhibits compliance with exercise, home  and travel O2 prescription;Other    Comments Keoki states that he sleeps with a Ventilator. He uses a Trilogy Vent but is not sure what his settings are. Imformed him to take a picture of his home ventilator screen so he can know if he is using it as a CPAP, BIPAP or other. He state that he wears his device everynight for sleep.    Goals/Expected Outcomes Short: inform staff of sleeping mode. Long: maintain ventilator independently             Initial Exercise Prescription:  Initial Exercise Prescription - 10/03/20 1700       Date of Initial Exercise RX and Referring Provider   Date 10/03/20    Referring Provider Ottie Glazier MD      Oxygen  Oxygen Continuous    Liters 2      NuStep   Level 1    SPM 80    Minutes 15    METs 1      Arm Ergometer   Level 1    RPM 30    Minutes 15    METs 1      Biostep-RELP   Level 1    SPM 50    Minutes 15    METs 1      Prescription Details   Frequency (times per week) 2    Duration Progress to 30 minutes of continuous aerobic without signs/symptoms of physical distress      Intensity   THRR 40-80% of Max Heartrate 107-139    Ratings of Perceived Exertion 11-13    Perceived Dyspnea 0-4      Progression   Progression Continue to progress workloads to maintain intensity without signs/symptoms of physical distress.      Resistance Training   Training Prescription Yes    Weight 3 lb    Reps 10-15             Perform Capillary Blood Glucose checks as needed.  Exercise Prescription Changes:   Exercise Prescription Changes     Row Name 10/03/20 1700 10/17/20 0800 10/30/20 1500         Response to Exercise    Blood Pressure (Admit) 96/60 102/58 100/60     Blood Pressure (Exercise) 104/60 120/60 110/60     Blood Pressure (Exit) 100/62 98/64 108/64     Heart Rate (Admit) 76 bpm 76 bpm 78 bpm     Heart Rate (Exercise) 85 bpm 84 bpm 80 bpm     Heart Rate (Exit) 76 bpm 78 bpm 75 bpm     Oxygen Saturation (Admit) 97 % 95 % 95 %     Oxygen Saturation (Exercise) 94 % 95 % 93 %     Oxygen Saturation (Exit) 98 % 97 % 97 %     Rating of Perceived Exertion (Exercise) 11 13 11      Perceived Dyspnea (Exercise) 2 2 3      Symptoms SOB SOB SOB     Comments walk test results second full day of exercise --     Duration -- Progress to 30 minutes of  aerobic without signs/symptoms of physical distress Progress to 30 minutes of  aerobic without signs/symptoms of physical distress     Intensity -- THRR unchanged THRR unchanged           Progression       Progression -- Continue to progress workloads to maintain intensity without signs/symptoms of physical distress. Continue to progress workloads to maintain intensity without signs/symptoms of physical distress.     Average METs -- 1.2 2           Resistance Training       Training Prescription -- Yes Yes     Weight -- 3 lb 3 lb     Reps -- 10-15 10-15           Interval Training       Interval Training -- No No           Oxygen       Oxygen -- Continuous Continuous     Liters -- 2 2           NuStep       Level -- 1 4     Minutes -- 30  30     METs -- 1.2 2           REL-XR       Level -- 1 --     Minutes -- 15 --             Exercise Comments:   Exercise Comments     Row Name 10/14/20 1410           Exercise Comments First full day of exercise!  Patient was oriented to gym and equipment including functions, settings, policies, and procedures.  Patient's individual exercise prescription and treatment plan were reviewed.  All starting workloads were established based on the results of the 6 minute walk test done at initial orientation  visit.  The plan for exercise progression was also introduced and progression will be customized based on patient's performance and goals.                Exercise Goals and Review:   Exercise Goals     Row Name 10/03/20 1732             Exercise Goals   Increase Physical Activity Yes       Intervention Provide advice, education, support and counseling about physical activity/exercise needs.;Develop an individualized exercise prescription for aerobic and resistive training based on initial evaluation findings, risk stratification, comorbidities and participant's personal goals.       Expected Outcomes Short Term: Attend rehab on a regular basis to increase amount of physical activity.;Long Term: Add in home exercise to make exercise part of routine and to increase amount of physical activity.;Long Term: Exercising regularly at least 3-5 days a week.       Increase Strength and Stamina Yes       Intervention Provide advice, education, support and counseling about physical activity/exercise needs.;Develop an individualized exercise prescription for aerobic and resistive training based on initial evaluation findings, risk stratification, comorbidities and participant's personal goals.       Expected Outcomes Short Term: Increase workloads from initial exercise prescription for resistance, speed, and METs.;Short Term: Perform resistance training exercises routinely during rehab and add in resistance training at home;Long Term: Improve cardiorespiratory fitness, muscular endurance and strength as measured by increased METs and functional capacity (6MWT)       Able to understand and use rate of perceived exertion (RPE) scale Yes       Intervention Provide education and explanation on how to use RPE scale       Expected Outcomes Short Term: Able to use RPE daily in rehab to express subjective intensity level;Long Term:  Able to use RPE to guide intensity level when exercising independently        Able to understand and use Dyspnea scale Yes       Intervention Provide education and explanation on how to use Dyspnea scale       Expected Outcomes Short Term: Able to use Dyspnea scale daily in rehab to express subjective sense of shortness of breath during exertion;Long Term: Able to use Dyspnea scale to guide intensity level when exercising independently       Knowledge and understanding of Target Heart Rate Range (THRR) Yes       Intervention Provide education and explanation of THRR including how the numbers were predicted and where they are located for reference       Expected Outcomes Short Term: Able to state/look up THRR;Short Term: Able to use daily as guideline for intensity in rehab;Long Term: Able  to use THRR to govern intensity when exercising independently       Able to check pulse independently Yes       Intervention Provide education and demonstration on how to check pulse in carotid and radial arteries.;Review the importance of being able to check your own pulse for safety during independent exercise       Expected Outcomes Short Term: Able to explain why pulse checking is important during independent exercise;Long Term: Able to check pulse independently and accurately       Understanding of Exercise Prescription Yes       Intervention Provide education, explanation, and written materials on patient's individual exercise prescription       Expected Outcomes Short Term: Able to explain program exercise prescription;Long Term: Able to explain home exercise prescription to exercise independently                Exercise Goals Re-Evaluation :  Exercise Goals Re-Evaluation     Row Name 10/14/20 1410 10/17/20 0843 10/28/20 1418         Exercise Goal Re-Evaluation   Exercise Goals Review Able to understand and use rate of perceived exertion (RPE) scale;Understanding of Exercise Prescription;Knowledge and understanding of Target Heart Rate Range (THRR);Increase Physical  Activity;Increase Strength and Stamina;Able to understand and use Dyspnea scale;Able to check pulse independently Increase Physical Activity;Increase Strength and Stamina;Understanding of Exercise Prescription Increase Physical Activity;Increase Strength and Stamina     Comments Reviewed RPE and dyspnea scales, THR and program prescription with pt today.  Pt voiced understanding and was given a copy of goals to take home. Lavontae is off to a good start in rehab.  He has completed his first two full days of exercise.  He has been using his one prothesis to help with leg and arms on the machines.  We will continue to monitor his progress. Harol uses resistance bands at home about three times a week. He has yet to go over home exercise with the EP. He is willing to work hard and work at getting more mobile.     Expected Outcomes Short: Use RPE daily to regulate intensity. Long: Follow program prescription in THR. Short: Continue to attend rehab regularly Long; Continue to follow program prescription Short: meet with EP for home exercise. Long: maintain a home exercise routine.              Discharge Exercise Prescription (Final Exercise Prescription Changes):  Exercise Prescription Changes - 10/30/20 1500       Response to Exercise   Blood Pressure (Admit) 100/60    Blood Pressure (Exercise) 110/60    Blood Pressure (Exit) 108/64    Heart Rate (Admit) 78 bpm    Heart Rate (Exercise) 80 bpm    Heart Rate (Exit) 75 bpm    Oxygen Saturation (Admit) 95 %    Oxygen Saturation (Exercise) 93 %    Oxygen Saturation (Exit) 97 %    Rating of Perceived Exertion (Exercise) 11    Perceived Dyspnea (Exercise) 3    Symptoms SOB    Duration Progress to 30 minutes of  aerobic without signs/symptoms of physical distress    Intensity THRR unchanged      Progression   Progression Continue to progress workloads to maintain intensity without signs/symptoms of physical distress.    Average METs 2       Resistance Training   Training Prescription Yes    Weight 3 lb    Reps 10-15  Interval Training   Interval Training No      Oxygen   Oxygen Continuous    Liters 2      NuStep   Level 4    Minutes 30    METs 2             Nutrition:  Target Goals: Understanding of nutrition guidelines, daily intake of sodium 1500mg , cholesterol 200mg , calories 30% from fat and 7% or less from saturated fats, daily to have 5 or more servings of fruits and vegetables.  Education: All About Nutrition: -Group instruction provided by verbal, written material, interactive activities, discussions, models, and posters to present general guidelines for heart healthy nutrition including fat, fiber, MyPlate, the role of sodium in heart healthy nutrition, utilization of the nutrition label, and utilization of this knowledge for meal planning. Follow up email sent as well. Written material given at graduation.   Biometrics:  Pre Biometrics - 10/03/20 1728       Pre Biometrics   Height 6\' 1"  (1.854 m)   Verbal by patient; includes prosthetic leg   Weight 149 lb 9.6 oz (67.9 kg)   subtracted 43 lb from wheelchair and prosthetic leg   BMI (Calculated) 19.74              Nutrition Therapy Plan and Nutrition Goals:  Nutrition Therapy & Goals - 10/28/20 1334       Nutrition Therapy   Diet Heart healthy, low Na, pulmonary MNT    Drug/Food Interactions Statins/Certain Fruits    Protein (specify units) 80g    Fiber 30 grams    Whole Grain Foods 3 servings    Saturated Fats 12 max. grams    Fruits and Vegetables 8 servings/day    Sodium 1.5 grams      Personal Nutrition Goals   Nutrition Goal ST: eat more small, frequent meals. Try more non-starchy vegetables and have only 1/2 plate CHO instead of 3/4 plate CHO to help with breathing. LT: improve shortness of breath with meals and after meals    Comments B: bacon,sausages, or ham with eggs, biscuits, hashbrowns coffee (with cream) S: apple  D: meat (beef, chicken, salmon, pork) with can of mixed vegetable sor potato with butter. Salt to taste while cooking and before eating. Uses bacon grease or butter. Drinks: water, regular pepsi with dinner. loaf bread - white bread with all meals. Discussed heart healthy eaitng and pulmonary MNT. Pt not interested in making ay heart healthy changes, but is will to make some small changes like smaller, more frequent meals and lowering amounts of carbohydrates with meals to at least 1/2 plate instead of 3/4 plate.      Intervention Plan   Intervention Prescribe, educate and counsel regarding individualized specific dietary modifications aiming towards targeted core components such as weight, hypertension, lipid management, diabetes, heart failure and other comorbidities.;Nutrition handout(s) given to patient.    Expected Outcomes Short Term Goal: Understand basic principles of dietary content, such as calories, fat, sodium, cholesterol and nutrients.;Short Term Goal: A plan has been developed with personal nutrition goals set during dietitian appointment.;Long Term Goal: Adherence to prescribed nutrition plan.             Nutrition Assessments:  MEDIFICTS Score Key: ?70 Need to make dietary changes  40-70 Heart Healthy Diet ? 40 Therapeutic Level Cholesterol Diet  Flowsheet Row Pulmonary Rehab from 10/03/2020 in [facility] Card[facility] Pulmonary Rehab  Picture Your Plate Total Score on Admission 55  Picture Your Plate Scores: <96 Unhealthy dietary pattern with much room for improvement. 41-50 Dietary pattern unlikely to meet recommendations for good health and room for improvement. 51-60 More healthful dietary pattern, with some room for improvement.  >60 Healthy dietary pattern, although there may be some specific behaviors that could be improved.   Nutrition Goals Re-Evaluation:   Nutrition Goals Discharge (Final Nutrition Goals Re-Evaluation):   Psychosocial: Target Goals:  Acknowledge presence or absence of significant depression and/or stress, maximize coping skills, provide positive support system. Participant is able to verbalize types and ability to use techniques and skills needed for reducing stress and depression.   Education: Stress, Anxiety, and Depression - Group verbal and visual presentation to define topics covered.  Reviews how body is impacted by stress, anxiety, and depression.  Also discusses healthy ways to reduce stress and to treat/manage anxiety and depression.  Written material given at graduation.   Education: Sleep Hygiene -Provides group verbal and written instruction about how sleep can affect your health.  Define sleep hygiene, discuss sleep cycles and impact of sleep habits. Review good sleep hygiene tips.    Initial Review & Psychosocial Screening:  Initial Psych Review & Screening - 10/03/20 1550       Initial Review   Current issues with Current Psychotropic Meds;Current Stress Concerns    Source of Stress Concerns Family    Comments Harolds kids are not a source of his mental support. He can look to his wife and sisters for support.      Family Dynamics   Good Support System? Yes      Barriers   Psychosocial barriers to participate in program The patient should benefit from training in stress management and relaxation.      Screening Interventions   Interventions To provide support and resources with identified psychosocial needs;Provide feedback about the scores to participant;Encouraged to exercise    Expected Outcomes Short Term goal: Utilizing psychosocial counselor, staff and physician to assist with identification of specific Stressors or current issues interfering with healing process. Setting desired goal for each stressor or current issue identified.;Long Term Goal: Stressors or current issues are controlled or eliminated.;Short Term goal: Identification and review with participant of any Quality of Life or Depression  concerns found by scoring the questionnaire.;Long Term goal: The participant improves quality of Life and PHQ9 Scores as seen by post scores and/or verbalization of changes             Quality of Life Scores:  Scores of 19 and below usually indicate a poorer quality of life in these areas.  A difference of  2-3 points is a clinically meaningful difference.  A difference of 2-3 points in the total score of the Quality of Life Index has been associated with significant improvement in overall quality of life, self-image, physical symptoms, and general health in studies assessing change in quality of life.  PHQ-9: Recent Review Flowsheet Data     Depression screen [facility] 2/9 10/28/2020 10/03/2020 08/06/2020 04/05/2020 02/01/2020   Decreased Interest 0 1 0 0 0   Down, Depressed, Hopeless 0 0 0 0 0   PHQ - 2 Score 0 1 0 0 0   Altered sleeping 0 0 - - -   Tired, decreased energy 2 3 - - -   Change in appetite 0 1 - - -   Feeling bad or failure about yourself  0 1 - - -   Trouble concentrating 2 3 - - -  Moving slowly or fidgety/restless 0 0 - - -   Suicidal thoughts 0 0 - - -   PHQ-9 Score 4 9 - - -   Difficult doing work/chores Not difficult at all Very difficult - - -      Interpretation of Total Score  Total Score Depression Severity:  1-4 = Minimal depression, 5-9 = Mild depression, 10-14 = Moderate depression, 15-19 = Moderately severe depression, 20-27 = Severe depression   Psychosocial Evaluation and Intervention:  Psychosocial Evaluation - 10/03/20 1553       Psychosocial Evaluation & Interventions   Interventions Encouraged to exercise with the program and follow exercise prescription;Relaxation education;Stress management education    Comments Harolds kids are not a source of his mental support. He can look to his wife and sisters for support.    Expected Outcomes Short: Exercise regularly to support mental health and notify staff of any changes. Long: maintain mental health and  well being through teaching of rehab or prescribed medications independently.    Continue Psychosocial Services  Follow up required by staff             Psychosocial Re-Evaluation:  Psychosocial Re-Evaluation     Duncan Name 10/28/20 1431             Psychosocial Re-Evaluation   Current issues with Current Psychotropic Meds       Comments Reviewed patient health questionnaire (PHQ-9) with patient for follow up. Previously, patients score indicated signs/symptoms of depression.  Reviewed to see if patient is improving symptom wise while in program.  Score improved and patient states that he just has weeks where he feels better than the other.       Expected Outcomes Short: Continue to attend [facility] regularly for regular exercise and social engagement. Long: Continue to improve symptoms and manage a positive mental state.       Interventions Encouraged to attend Pulmonary Rehabilitation for the exercise       Continue Psychosocial Services  Follow up required by staff                Psychosocial Discharge (Final Psychosocial Re-Evaluation):  Psychosocial Re-Evaluation - 10/28/20 1431       Psychosocial Re-Evaluation   Current issues with Current Psychotropic Meds    Comments Reviewed patient health questionnaire (PHQ-9) with patient for follow up. Previously, patients score indicated signs/symptoms of depression.  Reviewed to see if patient is improving symptom wise while in program.  Score improved and patient states that he just has weeks where he feels better than the other.    Expected Outcomes Short: Continue to attend [facility] regularly for regular exercise and social engagement. Long: Continue to improve symptoms and manage a positive mental state.    Interventions Encouraged to attend Pulmonary Rehabilitation for the exercise    Continue Psychosocial Services  Follow up required by staff             Education: Education Goals: Education classes will be provided  on a weekly basis, covering required topics. Participant will state understanding/return demonstration of topics presented.  Learning Barriers/Preferences:  Learning Barriers/Preferences - 10/03/20 1549       Learning Barriers/Preferences   Learning Barriers None    Learning Preferences None             General Pulmonary Education Topics:  Infection Prevention: - Provides verbal and written material to individual with discussion of infection control including proper hand washing and proper equipment cleaning during exercise  session. Flowsheet Row Pulmonary Rehab from 10/16/2020 in [facility] Cardiac and Pulmonary Rehab  Date 10/03/20  Educator [facility]  Instruction Review Code 1- Verbalizes Understanding       Falls Prevention: - Provides verbal and written material to individual with discussion of falls prevention and safety. Flowsheet Row Pulmonary Rehab from 10/16/2020 in [facility] Cardiac and Pulmonary Rehab  Date 10/03/20  Educator [facility]  Instruction Review Code 1- Verbalizes Understanding       Chronic Lung Disease Review: - Group verbal instruction with posters, models, PowerPoint presentations and videos,  to review new updates, new respiratory medications, new advancements in procedures and treatments. Providing information on websites and "800" numbers for continued self-education. Includes information about supplement oxygen, available portable oxygen systems, continuous and intermittent flow rates, oxygen safety, concentrators, and Medicare reimbursement for oxygen. Explanation of Pulmonary Drugs, including class, frequency, complications, importance of spacers, rinsing mouth after steroid MDI's, and proper cleaning methods for nebulizers. Review of basic lung anatomy and physiology related to function, structure, and complications of lung disease. Review of risk factors. Discussion about methods for diagnosing sleep apnea and types of masks and machines for OSA. Includes a review of the  use of types of environmental controls: home humidity, furnaces, filters, dust mite/pet prevention, HEPA vacuums. Discussion about weather changes, air quality and the benefits of nasal washing. Instruction on Warning signs, infection symptoms, calling MD promptly, preventive modes, and value of vaccinations. Review of effective airway clearance, coughing and/or vibration techniques. Emphasizing that all should Create an Action Plan. Written material given at graduation.   AED/CPR: - Group verbal and written instruction with the use of models to demonstrate the basic use of the AED with the basic ABC's of resuscitation.    Anatomy and Cardiac Procedures: - Group verbal and visual presentation and models provide information about basic cardiac anatomy and function. Reviews the testing methods done to diagnose heart disease and the outcomes of the test results. Describes the treatment choices: Medical Management, Angioplasty, or Coronary Bypass Surgery for treating various heart conditions including Myocardial Infarction, Angina, Valve Disease, and Cardiac Arrhythmias.  Written material given at graduation.   Medication Safety: - Group verbal and visual instruction to review commonly prescribed medications for heart and lung disease. Reviews the medication, class of the drug, and side effects. Includes the steps to properly store meds and maintain the prescription regimen.  Written material given at graduation.   Other: -Provides group and verbal instruction on various topics (see comments)   Knowledge Questionnaire Score:  Knowledge Questionnaire Score - 10/03/20 1740       Knowledge Questionnaire Score   Pre Score 13/18: Oxygen, HR              Core Components/Risk Factors/Patient Goals at Admission:  Personal Goals and Risk Factors at Admission - 10/03/20 1549       Core Components/Risk Factors/Patient Goals on Admission    Weight Management Yes;Weight Maintenance   Patient  reports maintain weight, despite BMI   Intervention Weight Management: Develop a combined nutrition and exercise program designed to reach desired caloric intake, while maintaining appropriate intake of nutrient and fiber, sodium and fats, and appropriate energy expenditure required for the weight goal.;Weight Management: Provide education and appropriate resources to help participant work on and attain dietary goals.;Weight Management/Obesity: Establish reasonable short term and long term weight goals.    Admit Weight 149 lb (67.6 kg)    Goal Weight: Short Term 149 lb (67.6 kg)    Goal  Weight: Long Term 149 lb (67.6 kg)    Expected Outcomes Short Term: Continue to assess and modify interventions until short term weight is achieved;Long Term: Adherence to nutrition and physical activity/exercise program aimed toward attainment of established weight goal;Weight Maintenance: Understanding of the daily nutrition guidelines, which includes 25-35% calories from fat, 7% or less cal from saturated fats, less than 200mg  cholesterol, less than 1.5gm of sodium, & 5 or more servings of fruits and vegetables daily;Understanding recommendations for meals to include 15-35% energy as protein, 25-35% energy from fat, 35-60% energy from carbohydrates, less than 200mg  of dietary cholesterol, 20-35 gm of total fiber daily;Understanding of distribution of calorie intake throughout the day with the consumption of 4-5 meals/snacks    Improve shortness of breath with ADL's Yes    Intervention Provide education, individualized exercise plan and daily activity instruction to help decrease symptoms of SOB with activities of daily living.    Expected Outcomes Short Term: Improve cardiorespiratory fitness to achieve a reduction of symptoms when performing ADLs;Long Term: Be able to perform more ADLs without symptoms or delay the onset of symptoms    Heart Failure Yes    Intervention Provide a combined exercise and nutrition program  that is supplemented with education, support and counseling about heart failure. Directed toward relieving symptoms such as shortness of breath, decreased exercise tolerance, and extremity edema.    Expected Outcomes Improve functional capacity of life;Short term: Attendance in program 2-3 days a week with increased exercise capacity. Reported lower sodium intake. Reported increased fruit and vegetable intake. Reports medication compliance.;Short term: Daily weights obtained and reported for increase. Utilizing diuretic protocols set by physician.;Long term: Adoption of self-care skills and reduction of barriers for early signs and symptoms recognition and intervention leading to self-care maintenance.    Hypertension Yes    Intervention Provide education on lifestyle modifcations including regular physical activity/exercise, weight management, moderate sodium restriction and increased consumption of fresh fruit, vegetables, and low fat dairy, alcohol moderation, and smoking cessation.;Monitor prescription use compliance.    Expected Outcomes Short Term: Continued assessment and intervention until BP is < 140/15mm HG in hypertensive participants. < 130/81mm HG in hypertensive participants with diabetes, heart failure or chronic kidney disease.;Long Term: Maintenance of blood pressure at goal levels.    Lipids Yes    Intervention Provide education and support for participant on nutrition & aerobic/resistive exercise along with prescribed medications to achieve LDL 70mg , HDL >40mg .    Expected Outcomes Short Term: Participant states understanding of desired cholesterol values and is compliant with medications prescribed. Participant is following exercise prescription and nutrition guidelines.;Long Term: Cholesterol controlled with medications as prescribed, with individualized exercise RX and with personalized nutrition plan. Value goals: LDL < 70mg , HDL > 40 mg.             Education:Diabetes -  Individual verbal and written instruction to review signs/symptoms of diabetes, desired ranges of glucose level fasting, after meals and with exercise. Acknowledge that pre and post exercise glucose checks will be done for 3 sessions at entry of program.   Know Your Numbers and Heart Failure: - Group verbal and visual instruction to discuss disease risk factors for cardiac and pulmonary disease and treatment options.  Reviews associated critical values for Overweight/Obesity, Hypertension, Cholesterol, and Diabetes.  Discusses basics of heart failure: signs/symptoms and treatments.  Introduces Heart Failure Zone chart for action plan for heart failure.  Written material given at graduation. Flowsheet Row Pulmonary Rehab from 10/16/2020 in [facility] Cardiac  and Pulmonary Rehab  Date 10/16/20  Educator [facility]  Instruction Review Code 1- Verbalizes Understanding       Core Components/Risk Factors/Patient Goals Review:   Goals and Risk Factor Review     Row Name 10/28/20 1426             Core Components/Risk Factors/Patient Goals Review   Personal Goals Review Improve shortness of breath with ADL's       Review Spoke to patient about their shortness of breath and what they can do to improve. Patient has been informed of breathing techniques when starting the program. Patient is informed to tell staff if they have had any med changes and that certain meds they are taking or not taking can be causing shortness of breath.       Expected Outcomes Short: Attend [facility] regularly to improve shortness of breath with ADL's. Long: maintain independence with ADL's                Core Components/Risk Factors/Patient Goals at Discharge (Final Review):   Goals and Risk Factor Review - 10/28/20 1426       Core Components/Risk Factors/Patient Goals Review   Personal Goals Review Improve shortness of breath with ADL's    Review Spoke to patient about their shortness of breath and what they can do to  improve. Patient has been informed of breathing techniques when starting the program. Patient is informed to tell staff if they have had any med changes and that certain meds they are taking or not taking can be causing shortness of breath.    Expected Outcomes Short: Attend [facility] regularly to improve shortness of breath with ADL's. Long: maintain independence with ADL's             ITP Comments:  ITP Comments     Row Name 10/03/20 1705 10/09/20 0722 10/14/20 1410 11/06/20 0818 11/11/20 1112   ITP Comments Completed 6 minute step test and gym orientation. Initial ITP created and sent for review to Dr. Emily Filbert, Medical Director. 30 Day review completed. Medical Director ITP review done, changes made as directed, and signed approval by Medical Director.   0 visits in May First full day of exercise!  Patient was oriented to gym and equipment including functions, settings, policies, and procedures.  Patient's individual exercise prescription and treatment plan were reviewed.  All starting workloads were established based on the results of the 6 minute walk test done at initial orientation visit.  The plan for exercise progression was also introduced and progression will be customized based on patient's performance and goals. 30 Day review completed. Medical Director ITP review done, changes made as directed, and signed approval by Medical Director. Patient called and wished to be discharged from Pulmonary Rehab at this time. No longer interested in the program.            Comments: Discharge ITP

## 2020-11-21 DIAGNOSIS — J9610 Chronic respiratory failure, unspecified whether with hypoxia or hypercapnia: Secondary | ICD-10-CM | POA: Diagnosis not present

## 2020-11-21 DIAGNOSIS — J441 Chronic obstructive pulmonary disease with (acute) exacerbation: Secondary | ICD-10-CM | POA: Diagnosis not present

## 2020-11-23 DIAGNOSIS — M5000 Cervical disc disorder with myelopathy, unspecified cervical region: Secondary | ICD-10-CM | POA: Diagnosis not present

## 2020-11-23 DIAGNOSIS — J441 Chronic obstructive pulmonary disease with (acute) exacerbation: Secondary | ICD-10-CM | POA: Diagnosis not present

## 2020-12-04 ENCOUNTER — Telehealth (INDEPENDENT_AMBULATORY_CARE_PROVIDER_SITE_OTHER): Payer: Self-pay | Admitting: Vascular Surgery

## 2020-12-04 NOTE — Telephone Encounter (Signed)
Bring him in, no studies me or dew

## 2020-12-04 NOTE — Telephone Encounter (Signed)
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 Patient called St[facility] and he advised patient to come in to be seen and not to wear prosthesis until stump has been evaluated. Patient was last seen 09/2020 with FB (57mo f/u). Please advise.

## 2020-12-05 NOTE — Telephone Encounter (Signed)
Called and scheduled patient

## 2020-12-10 ENCOUNTER — Ambulatory Visit (INDEPENDENT_AMBULATORY_CARE_PROVIDER_SITE_OTHER): Payer: Medicare HMO | Admitting: Vascular Surgery

## 2020-12-10 ENCOUNTER — Encounter (INDEPENDENT_AMBULATORY_CARE_PROVIDER_SITE_OTHER): Payer: Self-pay | Admitting: Vascular Surgery

## 2020-12-10 ENCOUNTER — Other Ambulatory Visit: Payer: Self-pay

## 2020-12-10 VITALS — BP 115/68 | HR 78 | Resp 16

## 2020-12-10 DIAGNOSIS — Z89611 Acquired absence of right leg above knee: Secondary | ICD-10-CM | POA: Diagnosis not present

## 2020-12-10 DIAGNOSIS — I5022 Chronic systolic (congestive) heart failure: Secondary | ICD-10-CM

## 2020-12-10 DIAGNOSIS — I10 Essential (primary) hypertension: Secondary | ICD-10-CM | POA: Diagnosis not present

## 2020-12-10 DIAGNOSIS — Z89512 Acquired absence of left leg below knee: Secondary | ICD-10-CM

## 2020-12-10 DIAGNOSIS — J449 Chronic obstructive pulmonary disease, unspecified: Secondary | ICD-10-CM

## 2020-12-10 NOTE — Progress Notes (Signed)
MRN : [id number]  Tyler [name] is a 65 y.o. (07-23-1955) male who presents with chief complaint of  Chief Complaint  Patient presents with   Follow-up    Left stump bruise  .  History of Present Illness: Patient returns today in follow up of his Left BKA stump. This has gotten a little loose and is moving on his leg and he has developed a nickel sized wound overlying his left patella.  It was very painful and very red but after coming out of the prosthesis for a couple of days it has improved.  He talked to his prosthetist who recommended he see [place] and then see him for potential revision of his BKA prosthesis.  No fevers or chills or signs of systemic infection.  No other symptoms or complaints today.  Current Outpatient Medications  Medication Sig Dispense Refill   acetaminophen (TYLENOL) 500 MG tablet Take 1,000 mg by mouth daily as needed for moderate pain or headache.     ALPRAZolam (XANAX) 1 MG tablet Take 1 mg by mouth 2 (two) times daily as needed for anxiety or sleep.      aspirin EC 81 MG tablet Take 1 tablet (81 mg total) by mouth daily. (Patient taking differently: Take 81 mg by mouth daily after supper.) 150 tablet 2   azithromycin (ZITHROMAX) 250 MG tablet Take 250 mg by mouth daily. Monday, Wednesday, Friday for 90 days     budesonide (PULMICORT) 0.25 MG/2ML nebulizer solution Inhale into the lungs.     citalopram (CELEXA) 10 MG tablet Take 10 mg by mouth daily.     docusate sodium (COLACE) 100 MG capsule Take 100 mg by mouth daily as needed (for constipation.).      Dupilumab (DUPIXENT) 300 MG/2ML SOPN Inject 300 mg into the skin every 14 (fourteen) days. Inject 2 mLs (300mg ).     ENTRESTO 24-26 MG Take 1 tablet by mouth 2 (two) times daily. 60 tablet 0   EPINEPHrine 0.3 mg/0.3 mL IJ SOAJ injection Inject into the muscle.     fluticasone (FLONASE) 50 MCG/ACT nasal spray Place 2 sprays into both nostrils daily.     fluticasone-salmeterol (ADVAIR HFA) 115-21 MCG/ACT  inhaler USE 2 INHALATIONS ORALLY EVERY 12 HOURS (Patient taking differently: Inhale 2 puffs into the lungs every 12 (twelve) hours.) 36 Inhaler 5   furosemide (LASIX) 20 MG tablet Take 2 tablets (40 mg total) by mouth daily. (Patient taking differently: Take 20 mg by mouth daily.) 60 tablet 0   gabapentin (NEURONTIN) 100 MG capsule Take 300 mg by mouth in the morning, at noon, and at bedtime.     guaiFENesin (MUCINEX) 600 MG 12 hr tablet Take 600 mg by mouth every evening.      ipratropium (ATROVENT) 0.03 % nasal spray Place 2 sprays into both nostrils 2 (two) times daily.      Ipratropium-Albuterol (COMBIVENT) 20-100 MCG/ACT AERS respimat Inhale 1 puff into the lungs every 4 (four) hours.     ipratropium-albuterol (DUONEB) 0.5-2.5 (3) MG/3ML SOLN Inhale into the lungs.     JARDIANCE 10 MG TABS tablet Take 10 mg by mouth daily with breakfast.     lovastatin (MEVACOR) 20 MG tablet Take 1 tablet by mouth daily.     metoprolol succinate (TOPROL-XL) 25 MG 24 hr tablet Take 25 mg by mouth daily.     montelukast (SINGULAIR) 10 MG tablet Take 10 mg by mouth at bedtime.     nitroGLYCERIN (NITROSTAT) 0.4 MG SL tablet  Place 0.4 mg under the tongue every 5 (five) minutes as needed for chest pain.     oxyCODONE-acetaminophen (PERCOCET) 7.5-325 MG tablet Take 1-2 tablets by mouth every 6 (six) hours as needed for moderate pain. 50 tablet 0   pantoprazole (PROTONIX) 40 MG tablet Take 40 mg by mouth daily before breakfast.      predniSONE (DELTASONE) 20 MG tablet Take 20 mg by mouth daily.     Respiratory Therapy Supplies (FLUTTER) DEVI Use as directed. 1 each 0   tamsulosin (FLOMAX) 0.4 MG CAPS capsule Take 0.4 mg by mouth daily after supper.   11   vitamin B-12 (CYANOCOBALAMIN) 1000 MCG tablet Take 1,000 mcg by mouth daily.     warfarin (COUMADIN) 5 MG tablet Take 5 mg by mouth daily.     furosemide (LASIX) 20 MG tablet Take 20 mg by mouth. (Patient not taking: No sig reported)     Gauze Pads & Dressings  (RESTORE FOAM DRESSING) 6"X8" PADS by Does not apply route. (Patient not taking: No sig reported)     levalbuterol (XOPENEX) 1.25 MG/3ML nebulizer solution Take 1.25 mg (3 mLs total) by nebulization every 4 (four) hours. (Patient not taking: No sig reported) 72 mL 12   loratadine (CLARITIN) 10 MG tablet Take 10 mg by mouth at bedtime. (Patient not taking: No sig reported)     lovastatin (MEVACOR) 20 MG tablet Take 20 mg by mouth at bedtime. (Patient not taking: No sig reported)     mupirocin ointment (BACTROBAN) 2 % Apply 1 application topically daily. (Patient not taking: No sig reported) 22 g 0   oxyCODONE-acetaminophen (PERCOCET) 7.5-325 MG tablet Take by mouth. (Patient not taking: No sig reported)     roflumilast (DALIRESP) 500 MCG TABS tablet Take 250 mcg by mouth daily. (Patient not taking: No sig reported)     sildenafil (VIAGRA) 25 MG tablet Take by mouth.     SPIRIVA RESPIMAT 1.25 MCG/ACT AERS INHALE 2 PUFFS BY MOUTH INTO THE LUNGS DAILY (Patient not taking: No sig reported) 4 g 6   Wound Dressings (RESTORE WOUND CARE DRESSING) PADS Apply 1 each topically daily.  (Patient not taking: No sig reported)     No current facility-administered medications for this visit.    Past Medical History:  Diagnosis Date   AICD (automatic cardioverter/defibrillator) present    Anxiety    Arthritis    Atherosclerosis of artery of extremity with ulceration ([place]) 09/2019   left foot s/p toe amp requiring debridement and futher toe amputations.   Atrial fibrillation (HCC)    Cervical spinal stenosis    with neuropathy   CHF (congestive heart failure) (HCC)    Constipation    COPD (chronic obstructive pulmonary disease) (HCC)    Coronary artery disease    Depression    Dyspnea    Dysrhythmia    atrial fibrillation   Emphysema of lung (HCC)    Factor 5 Leiden mutation, heterozygous ([place])    on coumadin   Factor V Leiden mutation ([place])    GERD (gastroesophageal reflux disease)    Hypertension     Lung nodule seen on imaging study    being followed by dr. [name]. just watching it for last few years, without change   Mitral valve insufficiency    Moderate tricuspid insufficiency    Myocardial infarction [facility]) 2004   stent placed, pacemaker implanted 2005   Osteomyelitis ([place]) 10/2019   left foot   Oxygen dependent    requires 2L  nasal prong oxygen 24 hours a day   Peripheral vascular disease ([place])    Presence of permanent cardiac pacemaker [id number]   PVD (peripheral vascular disease) ([place])    Sleep apnea    waiting to have sleep study. Used bipap while hospitalized and said it was great for him.    Past Surgical History:  Procedure Laterality Date   ABOVE KNEE LEG AMPUTATION Right    after below the knee amputation    AMPUTATION Left 11/22/2019   Procedure: AMPUTATION BELOW KNEE;  Surgeon: Algernon Huxley, MD;  Location: [facility] ORS;  Service: Vascular;  Laterality: Left;   AMPUTATION TOE Left 08/04/2019   Procedure: AMPUTATION TOE MPJ LEFT;  Surgeon: Samara Deist, DPM;  Location: [facility];  Service: Podiatry;  Laterality: Left;   AMPUTATION TOE Left 10/06/2019   Procedure: AMPUTATION TOE MPJ T1,T2 LEFT;  Surgeon: Samara Deist, DPM;  Location: [facility];  Service: Podiatry;  Laterality: Left;   APPLICATION OF WOUND VAC Left 01/12/2018   Procedure: APPLICATION OF WOUND VAC;  Surgeon: Algernon Huxley, MD;  Location: [facility];  Service: Vascular;  Laterality: Left;   BELOW KNEE LEG AMPUTATION Right    CATARACT EXTRACTION, BILATERAL Bilateral    COLONOSCOPY N/A 02/19/2020   Procedure: COLONOSCOPY;  Surgeon: Lesly Rubenstein, MD;  Location: [facility];  Service: Endoscopy;  Laterality: N/A;   COLONOSCOPY WITH PROPOFOL N/A 08/27/2020   Procedure: COLONOSCOPY WITH PROPOFOL;  Surgeon: Lesly Rubenstein, MD;  Location: [facility];  Service: Endoscopy;  Laterality: N/A;  REQ MID AM   CORONARY ANGIOPLASTY  2005   stent x 1 placed   ENDARTERECTOMY FEMORAL Left 08/11/2017   Procedure:  ENDARTERECTOMY FEMORAL;  Surgeon: Algernon Huxley, MD;  Location: [facility];  Service: Vascular;  Laterality: Left;   ESOPHAGOGASTRODUODENOSCOPY N/A 02/19/2020   Procedure: ESOPHAGOGASTRODUODENOSCOPY (EGD);  Surgeon: Lesly Rubenstein, MD;  Location: [facility] ENDOSCOPY;  Service: Endoscopy;  Laterality: N/A;   HEMATOMA EVACUATION Left 01/12/2018   Procedure: EVACUATION HEMATOMA ( DRAINING OF SEROMA);  Surgeon: Algernon Huxley, MD;  Location: [facility];  Service: Vascular;  Laterality: Left;   IMPLANTABLE CARDIOVERTER DEFIBRILLATOR (ICD) GENERATOR CHANGE Left 02/10/2017   Procedure: ICD GENERATOR CHANGE;  Surgeon: Isaias Cowman, MD;  Location: [facility];  Service: Cardiovascular;  Laterality: Left;   INSERT / REPLACE / REMOVE PACEMAKER  [id number], 2018   LOWER EXTREMITY ANGIOGRAPHY Left 06/07/2017   Procedure: LOWER EXTREMITY ANGIOGRAPHY;  Surgeon: Algernon Huxley, MD;  Location: [place] [facility];  Service: Cardiovascular;  Laterality: Left;   LOWER EXTREMITY ANGIOGRAPHY Left 10/05/2019   Procedure: LOWER EXTREMITY ANGIOGRAPHY;  Surgeon: Algernon Huxley, MD;  Location: [place] [facility];  Service: Cardiovascular;  Laterality: Left;   LOWER EXTREMITY INTERVENTION  06/07/2017   Procedure: LOWER EXTREMITY INTERVENTION;  Surgeon: Algernon Huxley, MD;  Location: [place] [facility];  Service: Cardiovascular;;   PERIPHERAL VASCULAR CATHETERIZATION Left 12/09/2015   Procedure: Lower Extremity Angiography;  Surgeon: Algernon Huxley, MD;  Location: [place] [facility];  Service: Cardiovascular;  Laterality: Left;   PERIPHERAL VASCULAR CATHETERIZATION  12/09/2015   Procedure: Lower Extremity Intervention;  Surgeon: Algernon Huxley, MD;  Location: [place] [facility];  Service: Cardiovascular;;   TONSILLECTOMY     TRANSMETATARSAL AMPUTATION Left 10/20/2019   Procedure: TRANSMETATARSAL AMPUTATION;  Surgeon: Sharlotte Alamo, DPM;  Location: [facility];  Service: Podiatry;  Laterality: Left;     Social History   Tobacco Use    Smoking  status: Former    Packs/day: 1.00    Years: 42.00    Pack years: 42.00    Types: Cigarettes    Quit date: 09/23/2013    Years since quitting: 7.2   Smokeless tobacco: Never  Vaping Use   Vaping Use: Never used  Substance Use Topics   Alcohol use: No    Comment: quit 6 years ago   Drug use: No      Family History  Problem Relation Age of Onset   Cancer Mother    Cancer Father    Heart disease Father      Allergies  Allergen Reactions   Apixaban Rash   Rivaroxaban Rash     REVIEW OF SYSTEMS (Negative unless checked)   Constitutional: [] Weight loss  [] Fever  [] Chills Cardiac: [] Chest pain   [] Chest pressure   [] Palpitations   [] Shortness of breath when laying flat   [] Shortness of breath at rest   [] Shortness of breath with exertion. Vascular:  [] Pain in legs with walking   [] Pain in legs at rest   [] Pain in legs when laying flat   [] Claudication   [] Pain in feet when walking  [] Pain in feet at rest  [] Pain in feet when laying flat   [] History of DVT   [] Phlebitis   [] Swelling in legs   [] Varicose veins   [] Non-healing ulcers Pulmonary:   [x] Uses home oxygen   [] Productive cough   [] Hemoptysis   [] Wheeze  [x] COPD   [] Asthma Neurologic:  [] Dizziness  [] Blackouts   [] Seizures   [] History of stroke   [] History of TIA  [] Aphasia   [] Temporary blindness   [] Dysphagia   [] Weakness or numbness in arms   [] Weakness or numbness in legs Musculoskeletal:  [x] Arthritis   [] Joint swelling   [] Joint pain   [] Low back pain Hematologic:  [] Easy bruising  [] Easy bleeding   [] Hypercoagulable state   [] Anemic   Gastrointestinal:  [] Blood in stool   [] Vomiting blood  [] Gastroesophageal reflux/heartburn   [] Abdominal pain Genitourinary:  [x] Chronic kidney disease   [] Difficult urination  [] Frequent urination  [] Burning with urination   [] Hematuria Skin:  [] Rashes   [x] Ulcers   [x] Wounds Psychological:  [] History of anxiety   []  History of major depression.  Physical Examination  BP  115/68 (BP Location: Left Arm)   Pulse 78   Resp 16  Gen:  WD/WN, NAD. Appears older than stated age. Head: [place]/AT, No temporalis wasting. Ear/Nose/Throat: Hearing grossly intact, nares w/o erythema or drainage Eyes: Conjunctiva clear. Sclera non-icteric Neck: Supple.  Trachea midline Pulmonary:  Good air movement, no use of accessory muscles on supplemental oxygen.  Cardiac: RRR, no JVD Vascular:  Vessel Right Left  Radial Palpable Palpable           Musculoskeletal: M/S 5/5 throughout. In a wheelchair. Right AKA well healed. Left BKA itself is healed but he has a nickel sized ulcer overlying the left patella.  This is clean but mildly indurated around the wound.  Neurologic: Sensation grossly intact in extremities.  Symmetrical.  Speech is fluent.  Psychiatric: Judgment intact, Mood & affect appropriate for pt's clinical situation. Dermatologic: Left patellar wound as above.      Labs No results found for this or any previous visit (from the past 2160 hour(s)).  Radiology No results found.  Assessment/Plan Hx of AKA (above knee amputation), right (HCC) Well healed   Essential hypertension, benign blood pressure control important in reducing the progression of atherosclerotic disease. On appropriate oral medications.  Chronic systolic CHF (congestive heart failure) (HCC) His most recent echocardiogram showed a reduction in his ejection fraction down to about 30%.  He he went for what was supposed to be a dual chamber pacemaker a few weeks ago but was found to already have that.  They did do some "tweaking" of the pacer and this may improve his function somewhat.   COPD (chronic obstructive pulmonary disease) (HCC) Severe, requiring oxygen  Hx of BKA, left ([place]) The patient has a wound likely because he has had some shrinkage of his stump and now there is some mobility.  It does not appear infected but is definitely irritated.  I would agree with not wearing his  prosthesis for the next few weeks.  He is going to make his appointment with his prosthetists to evaluate and it may need altered.  We will see him back in about a month or so to recheck it.    Leotis Pain, MD  12/10/2020 2:38 PM    This note was created with Dragon medical transcription system.  Any errors from dictation are purely unintentional

## 2020-12-10 NOTE — Assessment & Plan Note (Signed)
The patient has a wound likely because he has had some shrinkage of his stump and now there is some mobility.  It does not appear infected but is definitely irritated.  I would agree with not wearing his prosthesis for the next few weeks.  He is going to make his appointment with his prosthetists to evaluate and it may need altered.  We will see him back in about a month or so to recheck it.

## 2020-12-16 DIAGNOSIS — I482 Chronic atrial fibrillation, unspecified: Secondary | ICD-10-CM | POA: Diagnosis not present

## 2020-12-16 DIAGNOSIS — I2510 Atherosclerotic heart disease of native coronary artery without angina pectoris: Secondary | ICD-10-CM | POA: Diagnosis not present

## 2020-12-16 DIAGNOSIS — Z125 Encounter for screening for malignant neoplasm of prostate: Secondary | ICD-10-CM | POA: Diagnosis not present

## 2020-12-21 DIAGNOSIS — J441 Chronic obstructive pulmonary disease with (acute) exacerbation: Secondary | ICD-10-CM | POA: Diagnosis not present

## 2020-12-21 DIAGNOSIS — J9610 Chronic respiratory failure, unspecified whether with hypoxia or hypercapnia: Secondary | ICD-10-CM | POA: Diagnosis not present

## 2020-12-23 DIAGNOSIS — Z0001 Encounter for general adult medical examination with abnormal findings: Secondary | ICD-10-CM | POA: Diagnosis not present

## 2020-12-23 DIAGNOSIS — I10 Essential (primary) hypertension: Secondary | ICD-10-CM | POA: Diagnosis not present

## 2020-12-23 DIAGNOSIS — Z89512 Acquired absence of left leg below knee: Secondary | ICD-10-CM | POA: Diagnosis not present

## 2020-12-23 DIAGNOSIS — I482 Chronic atrial fibrillation, unspecified: Secondary | ICD-10-CM | POA: Diagnosis not present

## 2020-12-23 DIAGNOSIS — I70219 Atherosclerosis of native arteries of extremities with intermittent claudication, unspecified extremity: Secondary | ICD-10-CM | POA: Diagnosis not present

## 2020-12-23 DIAGNOSIS — E782 Mixed hyperlipidemia: Secondary | ICD-10-CM | POA: Diagnosis not present

## 2020-12-23 DIAGNOSIS — J431 Panlobular emphysema: Secondary | ICD-10-CM | POA: Diagnosis not present

## 2020-12-23 DIAGNOSIS — Z89611 Acquired absence of right leg above knee: Secondary | ICD-10-CM | POA: Diagnosis not present

## 2020-12-23 DIAGNOSIS — D6851 Activated protein C resistance: Secondary | ICD-10-CM | POA: Diagnosis not present

## 2020-12-24 DIAGNOSIS — Z125 Encounter for screening for malignant neoplasm of prostate: Secondary | ICD-10-CM | POA: Diagnosis not present

## 2020-12-24 DIAGNOSIS — M5000 Cervical disc disorder with myelopathy, unspecified cervical region: Secondary | ICD-10-CM | POA: Diagnosis not present

## 2020-12-24 DIAGNOSIS — I482 Chronic atrial fibrillation, unspecified: Secondary | ICD-10-CM | POA: Diagnosis not present

## 2020-12-24 DIAGNOSIS — J441 Chronic obstructive pulmonary disease with (acute) exacerbation: Secondary | ICD-10-CM | POA: Diagnosis not present

## 2020-12-24 DIAGNOSIS — I2510 Atherosclerotic heart disease of native coronary artery without angina pectoris: Secondary | ICD-10-CM | POA: Diagnosis not present

## 2021-01-13 DIAGNOSIS — I70219 Atherosclerosis of native arteries of extremities with intermittent claudication, unspecified extremity: Secondary | ICD-10-CM | POA: Diagnosis not present

## 2021-01-13 DIAGNOSIS — I071 Rheumatic tricuspid insufficiency: Secondary | ICD-10-CM | POA: Diagnosis not present

## 2021-01-13 DIAGNOSIS — I5022 Chronic systolic (congestive) heart failure: Secondary | ICD-10-CM | POA: Diagnosis not present

## 2021-01-13 DIAGNOSIS — Z95810 Presence of automatic (implantable) cardiac defibrillator: Secondary | ICD-10-CM | POA: Diagnosis not present

## 2021-01-13 DIAGNOSIS — I2510 Atherosclerotic heart disease of native coronary artery without angina pectoris: Secondary | ICD-10-CM | POA: Diagnosis not present

## 2021-01-13 DIAGNOSIS — I482 Chronic atrial fibrillation, unspecified: Secondary | ICD-10-CM | POA: Diagnosis not present

## 2021-01-13 DIAGNOSIS — I340 Nonrheumatic mitral (valve) insufficiency: Secondary | ICD-10-CM | POA: Diagnosis not present

## 2021-01-13 DIAGNOSIS — I10 Essential (primary) hypertension: Secondary | ICD-10-CM | POA: Diagnosis not present

## 2021-01-13 DIAGNOSIS — Z79899 Other long term (current) drug therapy: Secondary | ICD-10-CM | POA: Diagnosis not present

## 2021-01-13 DIAGNOSIS — I70229 Atherosclerosis of native arteries of extremities with rest pain, unspecified extremity: Secondary | ICD-10-CM | POA: Diagnosis not present

## 2021-01-13 DIAGNOSIS — J449 Chronic obstructive pulmonary disease, unspecified: Secondary | ICD-10-CM | POA: Diagnosis not present

## 2021-01-14 ENCOUNTER — Ambulatory Visit (INDEPENDENT_AMBULATORY_CARE_PROVIDER_SITE_OTHER): Payer: Medicare HMO | Admitting: Nurse Practitioner

## 2021-01-15 DIAGNOSIS — J449 Chronic obstructive pulmonary disease, unspecified: Secondary | ICD-10-CM | POA: Diagnosis not present

## 2021-01-16 ENCOUNTER — Ambulatory Visit (INDEPENDENT_AMBULATORY_CARE_PROVIDER_SITE_OTHER): Payer: Medicare HMO | Admitting: Nurse Practitioner

## 2021-01-21 DIAGNOSIS — J441 Chronic obstructive pulmonary disease with (acute) exacerbation: Secondary | ICD-10-CM | POA: Diagnosis not present

## 2021-01-21 DIAGNOSIS — J9610 Chronic respiratory failure, unspecified whether with hypoxia or hypercapnia: Secondary | ICD-10-CM | POA: Diagnosis not present

## 2021-01-22 ENCOUNTER — Ambulatory Visit (INDEPENDENT_AMBULATORY_CARE_PROVIDER_SITE_OTHER): Payer: Medicare HMO | Admitting: Nurse Practitioner

## 2021-01-22 ENCOUNTER — Encounter (INDEPENDENT_AMBULATORY_CARE_PROVIDER_SITE_OTHER): Payer: Self-pay | Admitting: Nurse Practitioner

## 2021-01-22 ENCOUNTER — Other Ambulatory Visit: Payer: Self-pay

## 2021-01-22 VITALS — BP 99/63 | HR 76 | Resp 16

## 2021-01-22 DIAGNOSIS — E782 Mixed hyperlipidemia: Secondary | ICD-10-CM

## 2021-01-22 DIAGNOSIS — Z89512 Acquired absence of left leg below knee: Secondary | ICD-10-CM

## 2021-01-22 DIAGNOSIS — I10 Essential (primary) hypertension: Secondary | ICD-10-CM | POA: Diagnosis not present

## 2021-01-22 MED ORDER — AMOXICILLIN-POT CLAVULANATE 875-125 MG PO TABS: 1.0000 | ORAL_TABLET | Freq: Two times a day (BID) | ORAL | 0 refills | Status: DC

## 2021-01-22 MED ORDER — SILVER SULFADIAZINE 1 % EX CREA: 1.0000 "application " | TOPICAL_CREAM | Freq: Every day | CUTANEOUS | 0 refills | Status: DC

## 2021-01-24 DIAGNOSIS — M5000 Cervical disc disorder with myelopathy, unspecified cervical region: Secondary | ICD-10-CM | POA: Diagnosis not present

## 2021-01-24 DIAGNOSIS — J441 Chronic obstructive pulmonary disease with (acute) exacerbation: Secondary | ICD-10-CM | POA: Diagnosis not present

## 2021-02-02 ENCOUNTER — Encounter (INDEPENDENT_AMBULATORY_CARE_PROVIDER_SITE_OTHER): Payer: Self-pay | Admitting: Nurse Practitioner

## 2021-02-02 NOTE — Progress Notes (Signed)
Subjective:    Patient ID: Tyler [name], male    DOB: July 06, 1955, 65 y.o.   MRN: [id number] Chief Complaint  Patient presents with   Follow-up    1 month follow up      Tyler [name] is a 65 year old male that presents today for evaluation of a wound on his left lower extremity.  He previously had a wound in the area of his patella however that has mostly healed.  Is greatly decreased in size.  However the periwound area subsequently did begin to develop little blisters with slight drainage.  The drainage is foul-smelling and painful however it is slightly itchy.  Patient denies any new wounds.  He denies any fever or chills.   Review of Systems  Skin:  Positive for wound.  All other systems reviewed and are negative.     Objective:   Physical Exam Vitals reviewed.  HENT:     Head: Normocephalic.  Cardiovascular:     Rate and Rhythm: Normal rate.  Pulmonary:     Effort: Pulmonary effort is normal.  Musculoskeletal:     Right Lower Extremity: Right leg is amputated above knee.     Left Lower Extremity: Left leg is amputated below knee.  Skin:    General: Skin is warm and dry.  Neurological:     Mental Status: He is alert and oriented to person, place, and time.     Motor: Weakness present.  Psychiatric:        Mood and Affect: Mood normal.        Behavior: Behavior normal.        Thought Content: Thought content normal.        Judgment: Judgment normal.    BP 99/63 (BP Location: Left Arm)   Pulse 76   Resp 16   Past Medical History:  Diagnosis Date   AICD (automatic cardioverter/defibrillator) present    Anxiety    Arthritis    Atherosclerosis of artery of extremity with ulceration ([place]) 09/2019   left foot s/p toe amp requiring debridement and futher toe amputations.   Atrial fibrillation (HCC)    Cervical spinal stenosis    with neuropathy   CHF (congestive heart failure) (HCC)    Constipation    COPD (chronic obstructive pulmonary disease) (HCC)     Coronary artery disease    Depression    Dyspnea    Dysrhythmia    atrial fibrillation   Emphysema of lung (HCC)    Factor 5 Leiden mutation, heterozygous ([place])    on coumadin   Factor V Leiden mutation ([place])    GERD (gastroesophageal reflux disease)    Hypertension    Lung nodule seen on imaging study    being followed by dr. [name]. just watching it for last few years, without change   Mitral valve insufficiency    Moderate tricuspid insufficiency    Myocardial infarction [facility]) 2004   stent placed, pacemaker implanted 2005   Osteomyelitis ([place]) 10/2019   left foot   Oxygen dependent    requires 2L nasal prong oxygen 24 hours a day   Peripheral vascular disease ([place])    Presence of permanent cardiac pacemaker [phone]   PVD (peripheral vascular disease) ([place])    Sleep apnea    waiting to have sleep study. Used bipap while hospitalized and said it was great for him.    Social History   Socioeconomic History   Marital status: Married    Spouse name: Bethena Roys  Number of children: Not on file   Years of education: Not on file   Highest education level: Not on file  Occupational History   Occupation: no employment    Comment: disabled  Tobacco Use   Smoking status: Former    Packs/day: 1.00    Years: 42.00    Pack years: 42.00    Types: Cigarettes    Quit date: 09/23/2013    Years since quitting: 7.3   Smokeless tobacco: Never  Vaping Use   Vaping Use: Never used  Substance and Sexual Activity   Alcohol use: No    Comment: quit 6 years ago   Drug use: No   Sexual activity: Yes  Other Topics Concern   Not on file  Social History Narrative   Patient lives with wife and a dog.   Social Determinants of Health   Financial Resource Strain: Not on file  Food Insecurity: No Food Insecurity   Worried About Charity fundraiser in the Last Year: Never true   Ran Out of Food in the Last Year: Never true  Transportation Needs: No Transportation Needs   Lack of  Transportation (Medical): No   Lack of Transportation (Non-Medical): No  Physical Activity: Not on file  Stress: No Stress Concern Present   Feeling of Stress : Not at all  Social Connections: Not on file  Intimate Partner Violence: Not on file    Past Surgical History:  Procedure Laterality Date   ABOVE KNEE LEG AMPUTATION Right    after below the knee amputation    AMPUTATION Left 11/22/2019   Procedure: AMPUTATION BELOW KNEE;  Surgeon: Algernon Huxley, MD;  Location: [facility] ORS;  Service: Vascular;  Laterality: Left;   AMPUTATION TOE Left 08/04/2019   Procedure: AMPUTATION TOE MPJ LEFT;  Surgeon: Samara Deist, DPM;  Location: [facility];  Service: Podiatry;  Laterality: Left;   AMPUTATION TOE Left 10/06/2019   Procedure: AMPUTATION TOE MPJ T1,T2 LEFT;  Surgeon: Samara Deist, DPM;  Location: [facility];  Service: Podiatry;  Laterality: Left;   APPLICATION OF WOUND VAC Left 01/12/2018   Procedure: APPLICATION OF WOUND VAC;  Surgeon: Algernon Huxley, MD;  Location: [facility];  Service: Vascular;  Laterality: Left;   BELOW KNEE LEG AMPUTATION Right    CATARACT EXTRACTION, BILATERAL Bilateral    COLONOSCOPY N/A 02/19/2020   Procedure: COLONOSCOPY;  Surgeon: Lesly Rubenstein, MD;  Location: [facility];  Service: Endoscopy;  Laterality: N/A;   COLONOSCOPY WITH PROPOFOL N/A 08/27/2020   Procedure: COLONOSCOPY WITH PROPOFOL;  Surgeon: Lesly Rubenstein, MD;  Location: [facility];  Service: Endoscopy;  Laterality: N/A;  REQ MID AM   CORONARY ANGIOPLASTY  2005   stent x 1 placed   ENDARTERECTOMY FEMORAL Left 08/11/2017   Procedure: ENDARTERECTOMY FEMORAL;  Surgeon: Algernon Huxley, MD;  Location: [facility];  Service: Vascular;  Laterality: Left;   ESOPHAGOGASTRODUODENOSCOPY N/A 02/19/2020   Procedure: ESOPHAGOGASTRODUODENOSCOPY (EGD);  Surgeon: Lesly Rubenstein, MD;  Location: [facility] END[facility]COPY;  Service: Endoscopy;  Laterality: N/A;   HEMATOMA EVACUATION Left 01/12/2018   Procedure: EVACUATION  HEMATOMA ( DRAINING OF SEROMA);  Surgeon: Algernon Huxley, MD;  Location: [facility];  Service: Vascular;  Laterality: Left;   IMPLANTABLE CARDIOVERTER DEFIBRILLATOR (ICD) GENERATOR CHANGE Left 02/10/2017   Procedure: ICD GENERATOR CHANGE;  Surgeon: Isaias Cowman, MD;  Location: [facility];  Service: Cardiovascular;  Laterality: Left;   INSERT / REPLACE / REMOVE PACEMAKER  [id number], 2018   LOWER EXTREMITY ANGIOGRAPHY  Left 06/07/2017   Procedure: LOWER EXTREMITY ANGIOGRAPHY;  Surgeon: Algernon Huxley, MD;  Location: [place] [facility];  Service: Cardiovascular;  Laterality: Left;   LOWER EXTREMITY ANGIOGRAPHY Left 10/05/2019   Procedure: LOWER EXTREMITY ANGIOGRAPHY;  Surgeon: Algernon Huxley, MD;  Location: [place] [facility];  Service: Cardiovascular;  Laterality: Left;   LOWER EXTREMITY INTERVENTION  06/07/2017   Procedure: LOWER EXTREMITY INTERVENTION;  Surgeon: Algernon Huxley, MD;  Location: [place] [facility];  Service: Cardiovascular;;   PERIPHERAL VASCULAR CATHETERIZATION Left 12/09/2015   Procedure: Lower Extremity Angiography;  Surgeon: Algernon Huxley, MD;  Location: [place] [facility];  Service: Cardiovascular;  Laterality: Left;   PERIPHERAL VASCULAR CATHETERIZATION  12/09/2015   Procedure: Lower Extremity Intervention;  Surgeon: Algernon Huxley, MD;  Location: [place] [facility];  Service: Cardiovascular;;   TONSILLECTOMY     TRANSMETATARSAL AMPUTATION Left 10/20/2019   Procedure: TRANSMETATARSAL AMPUTATION;  Surgeon: Sharlotte Alamo, DPM;  Location: [facility];  Service: Podiatry;  Laterality: Left;    Family History  Problem Relation Age of Onset   Cancer Mother    Cancer Father    Heart disease Father     Allergies  Allergen Reactions   Apixaban Rash   Rivaroxaban Rash    CBC Latest Ref Rng & Units 03/15/2020 11/30/2019 11/28/2019  WBC 4.0 - 10.5 K/uL 7.8 7.5 5.9  Hemoglobin 13.0 - 17.0 g/dL 12.1(L) 8.1(L) 8.0(L)  Hematocrit 39.0 - 52.0 % 35.8(L) 24.1(L) 24.3(L)  Platelets 150 - 400 K/uL  239 407(H) 320      CMP     Component Value Date/Time   NA 133 (L) 03/15/2020 1631   NA 134 (L) 01/12/2014 1153   K 3.9 03/15/2020 1631   K 4.0 01/12/2014 1153   CL 95 (L) 03/15/2020 1631   CL 96 (L) 01/12/2014 1153   CO2 29 03/15/2020 1631   CO2 31 01/12/2014 1153   GLUCOSE 100 (H) 03/15/2020 1631   GLUCOSE 97 01/12/2014 1153   BUN 8 03/15/2020 1631   BUN 7 01/12/2014 1153   CREATININE 0.71 03/15/2020 1631   CREATININE 0.92 01/12/2014 1153   CALCIUM 9.2 03/15/2020 1631   CALCIUM 9.2 01/12/2014 1153   PROT 6.6 11/22/2019 1512   PROT 7.3 01/12/2014 1153   ALBUMIN 3.6 11/22/2019 1512   ALBUMIN 4.2 01/12/2014 1153   AST 17 11/22/2019 1512   AST 15 01/12/2014 1153   ALT 11 11/22/2019 1512   ALT 22 01/12/2014 1153   ALKPHOS 67 11/22/2019 1512   ALKPHOS 70 01/12/2014 1153   BILITOT 0.9 11/22/2019 1512   BILITOT 0.5 01/12/2014 1153   GFRNONAA >60 03/15/2020 1631   GFRNONAA >60 01/12/2014 1153   GFRAA >60 11/30/2019 0412   GFRAA >60 01/12/2014 1153     No results found.     Assessment & Plan:   1. Hx of BKA, left ([place]) The blisters may have been related to tape irritation around the area.  We will prescribe an antibiotic to ensure there is no underlying infection.  We will also prescribe some silver Silvadene cream to treat the irritated area.  We will have the patient follow-up in 2 weeks to evaluate progress. - silver sulfADIAZINE (SILVADENE) 1 % cream; Apply 1 application topically daily.  Dispense: 50 g; Refill: 0 - amoxicillin-clavulanate (AUGMENTIN) 875-125 MG tablet; Take 1 tablet by mouth 2 (two) times daily.  Dispense: 20 tablet; Refill: 0  2. Essential hypertension, benign Continue antihypertensive medications as already ordered,  these medications have been reviewed and there are no changes at this time.   3. Mixed hyperlipidemia Continue statin as ordered and reviewed, no changes at this time    Current Outpatient Medications on File Prior to Visit   Medication Sig Dispense Refill   acetaminophen (TYLENOL) 500 MG tablet Take 1,000 mg by mouth daily as needed for moderate pain or headache.     ALPRAZolam (XANAX) 1 MG tablet Take 1 mg by mouth 2 (two) times daily as needed for anxiety or sleep.      aspirin EC 81 MG tablet Take 1 tablet (81 mg total) by mouth daily. (Patient taking differently: Take 81 mg by mouth daily after supper.) 150 tablet 2   azithromycin (ZITHROMAX) 250 MG tablet Take 250 mg by mouth daily. Monday, Wednesday, Friday for 90 days     citalopram (CELEXA) 10 MG tablet Take 10 mg by mouth daily.     docusate sodium (COLACE) 100 MG capsule Take 100 mg by mouth daily as needed (for constipation.).      Dupilumab (DUPIXENT) 300 MG/2ML SOPN Inject 300 mg into the skin every 14 (fourteen) days. Inject 2 mLs ('300mg'$ ).     ENTRESTO 24-26 MG Take 1 tablet by mouth 2 (two) times daily. 60 tablet 0   EPINEPHrine 0.3 mg/0.3 mL IJ SOAJ injection Inject into the muscle.     fluticasone (FLONASE) 50 MCG/ACT nasal spray Place 2 sprays into both nostrils daily.     fluticasone-salmeterol (ADVAIR HFA) 115-21 MCG/ACT inhaler USE 2 INHALATIONS ORALLY EVERY 12 HOURS (Patient taking differently: Inhale 2 puffs into the lungs every 12 (twelve) hours.) 36 Inhaler 5   furosemide (LASIX) 20 MG tablet Take 2 tablets (40 mg total) by mouth daily. (Patient taking differently: Take 20 mg by mouth daily.) 60 tablet 0   gabapentin (NEURONTIN) 100 MG capsule Take 300 mg by mouth in the morning, at noon, and at bedtime.     guaiFENesin (MUCINEX) 600 MG 12 hr tablet Take 600 mg by mouth every evening.      ipratropium (ATROVENT) 0.03 % nasal spray Place 2 sprays into both nostrils 2 (two) times daily.      Ipratropium-Albuterol (COMBIVENT) 20-100 MCG/ACT AERS respimat Inhale 1 puff into the lungs every 4 (four) hours.     ipratropium-albuterol (DUONEB) 0.5-2.5 (3) MG/3ML SOLN Inhale into the lungs.     JARDIANCE 10 MG TABS tablet Take 10 mg by mouth daily  with breakfast.     lovastatin (MEVACOR) 20 MG tablet Take 1 tablet by mouth daily.     metoprolol succinate (TOPROL-XL) 25 MG 24 hr tablet Take 25 mg by mouth daily.     montelukast (SINGULAIR) 10 MG tablet Take 10 mg by mouth at bedtime.     nitroGLYCERIN (NITROSTAT) 0.4 MG SL tablet Place 0.4 mg under the tongue every 5 (five) minutes as needed for chest pain.     oxyCODONE-acetaminophen (PERCOCET) 7.5-325 MG tablet Take 1-2 tablets by mouth every 6 (six) hours as needed for moderate pain. 50 tablet 0   pantoprazole (PROTONIX) 40 MG tablet Take 40 mg by mouth daily before breakfast.      predniSONE (DELTASONE) 20 MG tablet Take 20 mg by mouth daily.     Respiratory Therapy Supplies (FLUTTER) DEVI Use as directed. 1 each 0   tamsulosin (FLOMAX) 0.4 MG CAPS capsule Take 0.4 mg by mouth daily after supper.   11   vitamin B-12 (CYANOCOBALAMIN) 1000 MCG tablet Take 1,000 mcg by  mouth daily.     warfarin (COUMADIN) 5 MG tablet Take 5 mg by mouth daily.     furosemide (LASIX) 20 MG tablet Take 20 mg by mouth. (Patient not taking: No sig reported)     Gauze Pads & Dressings (RESTORE FOAM DRESSING) 6"X8" PADS by Does not apply route. (Patient not taking: No sig reported)     levalbuterol (XOPENEX) 1.25 MG/3ML nebulizer solution Take 1.25 mg (3 mLs total) by nebulization every 4 (four) hours. (Patient not taking: No sig reported) 72 mL 12   loratadine (CLARITIN) 10 MG tablet Take 10 mg by mouth at bedtime. (Patient not taking: No sig reported)     lovastatin (MEVACOR) 20 MG tablet Take 20 mg by mouth at bedtime. (Patient not taking: No sig reported)     mupirocin ointment (BACTROBAN) 2 % Apply 1 application topically daily. (Patient not taking: No sig reported) 22 g 0   oxyCODONE-acetaminophen (PERCOCET) 7.5-325 MG tablet Take by mouth. (Patient not taking: No sig reported)     roflumilast (DALIRESP) 500 MCG TABS tablet Take 250 mcg by mouth daily. (Patient not taking: No sig reported)     sildenafil  (VIAGRA) 25 MG tablet Take by mouth.     SPIRIVA RESPIMAT 1.25 MCG/ACT AERS INHALE 2 PUFFS BY MOUTH INTO THE LUNGS DAILY (Patient not taking: No sig reported) 4 g 6   Wound Dressings (RESTORE WOUND CARE DRESSING) PADS Apply 1 each topically daily.  (Patient not taking: No sig reported)     No current facility-administered medications on file prior to visit.    There are no Patient Instructions on file for this visit. No follow-ups on file.   Kris Hartmann, NP

## 2021-02-04 ENCOUNTER — Other Ambulatory Visit: Payer: Self-pay

## 2021-02-04 NOTE — Patient Outreach (Signed)
[facility]) Care Management  [place]  02/04/2021   Tyler [name] 1956/01/15 [id number]  Subjective: Patient reports doing ok but has small area to left stump that has come up and slow to heal.  Discussed skin care and prevention of infection.  COPD management continues.    Objective:   Encounter Medications:  Outpatient Encounter Medications as of 02/04/2021  Medication Sig Note   acetaminophen (TYLENOL) 500 MG tablet Take 1,000 mg by mouth daily as needed for moderate pain or headache.    ALPRAZolam (XANAX) 1 MG tablet Take 1 mg by mouth 2 (two) times daily as needed for anxiety or sleep.     amoxicillin-clavulanate (AUGMENTIN) 875-125 MG tablet Take 1 tablet by mouth 2 (two) times daily.    aspirin EC 81 MG tablet Take 1 tablet (81 mg total) by mouth daily. (Patient taking differently: Take 81 mg by mouth daily after supper.)    azithromycin (ZITHROMAX) 250 MG tablet Take 250 mg by mouth daily. Monday, Wednesday, Friday for 90 days    citalopram (CELEXA) 10 MG tablet Take 10 mg by mouth daily.    docusate sodium (COLACE) 100 MG capsule Take 100 mg by mouth daily as needed (for constipation.).     Dupilumab (DUPIXENT) 300 MG/2ML SOPN Inject 300 mg into the skin every 14 (fourteen) days. Inject 2 mLs ('300mg'$ ).    ENTRESTO 24-26 MG Take 1 tablet by mouth 2 (two) times daily.    EPINEPHrine 0.3 mg/0.3 mL IJ SOAJ injection Inject into the muscle.    fluticasone (FLONASE) 50 MCG/ACT nasal spray Place 2 sprays into both nostrils daily.    fluticasone-salmeterol (ADVAIR HFA) 115-21 MCG/ACT inhaler USE 2 INHALATIONS ORALLY EVERY 12 HOURS (Patient taking differently: Inhale 2 puffs into the lungs every 12 (twelve) hours.)    furosemide (LASIX) 20 MG tablet Take 2 tablets (40 mg total) by mouth daily. (Patient taking differently: Take 20 mg by mouth daily.)    furosemide (LASIX) 20 MG tablet Take 20 mg by mouth. (Patient not taking: No sig reported)    gabapentin  (NEURONTIN) 100 MG capsule Take 300 mg by mouth in the morning, at noon, and at bedtime.    Gauze Pads & Dressings (RESTORE FOAM DRESSING) 6"X8" PADS by Does not apply route. (Patient not taking: No sig reported)    guaiFENesin (MUCINEX) 600 MG 12 hr tablet Take 600 mg by mouth every evening.     ipratropium (ATROVENT) 0.03 % nasal spray Place 2 sprays into both nostrils 2 (two) times daily.     Ipratropium-Albuterol (COMBIVENT) 20-100 MCG/ACT AERS respimat Inhale 1 puff into the lungs every 4 (four) hours. 07/27/2019: Pt confirmed he takes every 4 hours   ipratropium-albuterol (DUONEB) 0.5-2.5 (3) MG/3ML SOLN Inhale into the lungs.    JARDIANCE 10 MG TABS tablet Take 10 mg by mouth daily with breakfast.    levalbuterol (XOPENEX) 1.25 MG/3ML nebulizer solution Take 1.25 mg (3 mLs total) by nebulization every 4 (four) hours. (Patient not taking: No sig reported)    loratadine (CLARITIN) 10 MG tablet Take 10 mg by mouth at bedtime. (Patient not taking: No sig reported)    lovastatin (MEVACOR) 20 MG tablet Take 20 mg by mouth at bedtime. (Patient not taking: No sig reported)    lovastatin (MEVACOR) 20 MG tablet Take 1 tablet by mouth daily.    metoprolol succinate (TOPROL-XL) 25 MG 24 hr tablet Take 25 mg by mouth daily.    montelukast (SINGULAIR) 10 MG  tablet Take 10 mg by mouth at bedtime.    mupirocin ointment (BACTROBAN) 2 % Apply 1 application topically daily. (Patient not taking: No sig reported)    nitroGLYCERIN (NITROSTAT) 0.4 MG SL tablet Place 0.4 mg under the tongue every 5 (five) minutes as needed for chest pain.    oxyCODONE-acetaminophen (PERCOCET) 7.5-325 MG tablet Take 1-2 tablets by mouth every 6 (six) hours as needed for moderate pain.    oxyCODONE-acetaminophen (PERCOCET) 7.5-325 MG tablet Take by mouth. (Patient not taking: No sig reported)    pantoprazole (PROTONIX) 40 MG tablet Take 40 mg by mouth daily before breakfast.     predniSONE (DELTASONE) 20 MG tablet Take 20 mg by mouth  daily.    Respiratory Therapy Supplies (FLUTTER) DEVI Use as directed.    roflumilast (DALIRESP) 500 MCG TABS tablet Take 250 mcg by mouth daily. (Patient not taking: No sig reported)    sildenafil (VIAGRA) 25 MG tablet Take by mouth.    silver sulfADIAZINE (SILVADENE) 1 % cream Apply 1 application topically daily.    SPIRIVA RESPIMAT 1.25 MCG/ACT AERS INHALE 2 PUFFS BY MOUTH INTO THE LUNGS DAILY (Patient not taking: No sig reported)    tamsulosin (FLOMAX) 0.4 MG CAPS capsule Take 0.4 mg by mouth daily after supper.     vitamin B-12 (CYANOCOBALAMIN) 1000 MCG tablet Take 1,000 mcg by mouth daily.    warfarin (COUMADIN) 5 MG tablet Take 5 mg by mouth daily.    Wound Dressings (RESTORE WOUND CARE DRESSING) PADS Apply 1 each topically daily.  (Patient not taking: No sig reported)    No facility-administered encounter medications on file as of 02/04/2021.    Functional Status:  In your present state of health, do you have any difficulty performing the following activities: 08/06/2020  Hearing? N  Vision? N  Difficulty concentrating or making decisions? N  Walking or climbing stairs? Y  Comment Patient double amputee  Dressing or bathing? N  Doing errands, shopping? Y  Comment Wife assists  Preparing Food and eating ? N  Using the Toilet? N  In the past six months, have you accidently leaked urine? N  Do you have problems with loss of bowel control? N  Managing your Medications? N  Managing your Finances? Y  Comment wife assists  Housekeeping or managing your Housekeeping? Y  Comment wife assists  Some recent data might be hidden    Fall/Depression Screening: Fall Risk  11/05/2020 10/03/2020 08/06/2020  Falls in the past year? 0 0 0  Number falls in past yr: - 0 -  Injury with Fall? - 0 -  Risk for fall due to : - - -  Follow up - Falls evaluation completed;Education provided;Falls prevention discussed -   PHQ 2/9 Scores 10/28/2020 10/03/2020 08/06/2020 04/05/2020 02/01/2020 12/01/2019  10/30/2019  PHQ - 2 Score 0 1 0 0 0 0 0  PHQ- 9 Score 4 9 - - - - -    Assessment:   Care Plan Care Plan : COPD (Adult)  Updates made by Jon Billings, RN since 02/04/2021 12:00 AM     Problem: Symptom Exacerbation (COPD)      Long-Range Goal: Symptom Exacerbation Prevented or Minimized as evidenced by no COPD exacerbations   Start Date: 03/04/2020  Expected End Date: 11/21/2021  This Visit's Progress: On track  Recent Progress: On track  Priority: High  Note:   Evidence-based guidance:   Promote physical activity or exercise to improve or maintain exercise capacity,  based on tolerance  that may include walking, water exercise, cycling or limb  muscle strength training.  Promote use of energy conservation and activity pacing techniques.Monitor for signs of respiratory infection, including changes in sputum color, volume and thickness, as well as fever.  Encourage infection prevention strategies that may include prophylactic antibiotic therapy for patients with history of frequent exacerbations or antibiotic administration during exacerbation based on presentation, risk and benefit.  Notes:     Task: Identify and Minimize Risk of COPD Exacerbation   Due Date: 11/21/2021  Priority: Routine  Responsible User: Jon Billings, RN  Note:   Care Management Activities:  Reiterated:   - healthy lifestyle promoted - rescue (action) plan reviewed    Notes: 08/06/20 patient has not had any exacerbation of COPD.  11/05/20 Patient doing pulmonary rehab.  He reports that is it tiring and not sure if he will be able to complete all 36 weeks.  Discussed home strengthening exercises.   02/04/21 Patient reports breathing about the same. Patient did not finish pulmonary rehab due not being able to handle the activity.  Discussed COPD management.  No concerns.       Goals Addressed             This Visit's Progress    Track and Manage My Symptoms   On track    Barriers: Health  Behaviors Timeframe:  Long-Range Goal Priority:  High Start Date:  03/04/20                          Expected End Date:    11/21/21              Follow Up Date: 12//31/22  - follow rescue plan if symptoms flare-up    Why is this important?   Tracking your symptoms and other information about your health helps your doctor plan your care.  Write down the symptoms, the time of day, what you were doing and what medicine you are taking.  You will soon learn how to manage your symptoms.     Notes: Keep up the great work!! 11/05/20  COPD Management Discussed: Oxygen use- keeping oxygen levels above 80. Encouraged patient to pace self with activity. Reviewed importance of medication compliance. Reviewed when to call for physician for increased shortness of breath, increased sputum, fatigue that is not his normal, cough, fever or sick contacts.  02/04/21 Patient has no concerns with COPD.  Encouraged continued management.          Plan:  Follow-up: Patient agrees to Care Plan and Follow-up. Follow-up in 3 month(s)  [name], RN, MSN [place] [facility] Care Management Coordinator Direct Line [phone] Cell [phone] Toll Free: [phone]  Fax: [phone]

## 2021-02-05 ENCOUNTER — Other Ambulatory Visit: Payer: Self-pay

## 2021-02-05 ENCOUNTER — Ambulatory Visit (INDEPENDENT_AMBULATORY_CARE_PROVIDER_SITE_OTHER): Payer: Medicare HMO | Admitting: Nurse Practitioner

## 2021-02-05 VITALS — BP 120/70 | HR 77 | Ht 73.0 in | Wt 152.0 lb

## 2021-02-05 DIAGNOSIS — I10 Essential (primary) hypertension: Secondary | ICD-10-CM | POA: Diagnosis not present

## 2021-02-05 DIAGNOSIS — Z89512 Acquired absence of left leg below knee: Secondary | ICD-10-CM | POA: Diagnosis not present

## 2021-02-05 DIAGNOSIS — I70229 Atherosclerosis of native arteries of extremities with rest pain, unspecified extremity: Secondary | ICD-10-CM | POA: Diagnosis not present

## 2021-02-10 ENCOUNTER — Encounter (INDEPENDENT_AMBULATORY_CARE_PROVIDER_SITE_OTHER): Payer: Self-pay | Admitting: Nurse Practitioner

## 2021-02-10 NOTE — Progress Notes (Signed)
Subjective:    Patient ID: Tyler [name], male    DOB: 1956/02/10, 65 y.o.   MRN: [id number] Chief Complaint  Patient presents with   Follow-up    2 weeks no studies    Tyler [name] is a 65 year old male that returns today for evaluation of a wound on his left lower extremity.  He previously had a wound in the area of his patella however that has mostly healed.  Is greatly decreased in size.  However the periwound area subsequently did begin to develop little blisters with slight drainage.  The drainage is foul-smelling and painful however it is slightly itchy.  Patient denies any new wounds.  He denies any fever or chills.   Review of Systems  Skin:  Positive for wound.  Neurological:  Positive for weakness.  All other systems reviewed and are negative.     Objective:   Physical Exam Vitals reviewed.  HENT:     Head: Normocephalic.  Cardiovascular:     Rate and Rhythm: Normal rate.  Pulmonary:     Effort: Pulmonary effort is normal.     Comments: Home 02 Musculoskeletal:     Right Lower Extremity: Right leg is amputated above knee.     Left Lower Extremity: Left leg is amputated below knee.  Neurological:     Mental Status: He is alert and oriented to person, place, and time.     Motor: Weakness present.     Gait: Gait abnormal.  Psychiatric:        Mood and Affect: Mood normal.        Behavior: Behavior normal.        Thought Content: Thought content normal.        Judgment: Judgment normal.    BP 120/70   Pulse 77   Ht 6\' 1"  (1.854 m)   Wt 152 lb (68.9 kg)   BMI 20.05 kg/m   Past Medical History:  Diagnosis Date   AICD (automatic cardioverter/defibrillator) present    Anxiety    Arthritis    Atherosclerosis of artery of extremity with ulceration ([place]) 09/2019   left foot s/p toe amp requiring debridement and futher toe amputations.   Atrial fibrillation (HCC)    Cervical spinal stenosis    with neuropathy   CHF (congestive heart failure) (HCC)     Constipation    COPD (chronic obstructive pulmonary disease) (HCC)    Coronary artery disease    Depression    Dyspnea    Dysrhythmia    atrial fibrillation   Emphysema of lung (HCC)    Factor 5 Leiden mutation, heterozygous ([place])    on coumadin   Factor V Leiden mutation ([place])    GERD (gastroesophageal reflux disease)    Hypertension    Lung nodule seen on imaging study    being followed by dr. [name]. just watching it for last few years, without change   Mitral valve insufficiency    Moderate tricuspid insufficiency    Myocardial infarction [facility]) 2004   stent placed, pacemaker implanted 2005   Osteomyelitis ([place]) 10/2019   left foot   Oxygen dependent    requires 2L nasal prong oxygen 24 hours a day   Peripheral vascular disease ([place])    Presence of permanent cardiac pacemaker [phone]   PVD (peripheral vascular disease) ([place])    Sleep apnea    waiting to have sleep study. Used bipap while hospitalized and said it was great for him.    Social  History   Socioeconomic History   Marital status: Married    Spouse name: Bethena Roys   Number of children: Not on file   Years of education: Not on file   Highest education level: Not on file  Occupational History   Occupation: no employment    Comment: disabled  Tobacco Use   Smoking status: Former    Packs/day: 1.00    Years: 42.00    Pack years: 42.00    Types: Cigarettes    Quit date: 09/23/2013    Years since quitting: 7.3   Smokeless tobacco: Never  Vaping Use   Vaping Use: Never used  Substance and Sexual Activity   Alcohol use: No    Comment: quit 6 years ago   Drug use: No   Sexual activity: Yes  Other Topics Concern   Not on file  Social History Narrative   Patient lives with wife and a dog.   Social Determinants of Health   Financial Resource Strain: Not on file  Food Insecurity: No Food Insecurity   Worried About Charity fundraiser in the Last Year: Never true   Ran Out of Food in the Last Year: Never true   Transportation Needs: No Transportation Needs   Lack of Transportation (Medical): No   Lack of Transportation (Non-Medical): No  Physical Activity: Not on file  Stress: Not on file  Social Connections: Not on file  Intimate Partner Violence: Not on file    Past Surgical History:  Procedure Laterality Date   ABOVE KNEE LEG AMPUTATION Right    after below the knee amputation    AMPUTATION Left 11/22/2019   Procedure: AMPUTATION BELOW KNEE;  Surgeon: Algernon Huxley, MD;  Location: [facility] ORS;  Service: Vascular;  Laterality: Left;   AMPUTATION TOE Left 08/04/2019   Procedure: AMPUTATION TOE MPJ LEFT;  Surgeon: Samara Deist, DPM;  Location: [facility];  Service: Podiatry;  Laterality: Left;   AMPUTATION TOE Left 10/06/2019   Procedure: AMPUTATION TOE MPJ T1,T2 LEFT;  Surgeon: Samara Deist, DPM;  Location: [facility];  Service: Podiatry;  Laterality: Left;   APPLICATION OF WOUND VAC Left 01/12/2018   Procedure: APPLICATION OF WOUND VAC;  Surgeon: Algernon Huxley, MD;  Location: [facility];  Service: Vascular;  Laterality: Left;   BELOW KNEE LEG AMPUTATION Right    CATARACT EXTRACTION, BILATERAL Bilateral    COLONOSCOPY N/A 02/19/2020   Procedure: COLONOSCOPY;  Surgeon: Lesly Rubenstein, MD;  Location: [facility] ENDOS[facility];  Service: Endoscopy;  Laterality: N/A;   COLONOSCOPY WITH PROPOFOL N/A 08/27/2020   Procedure: COLONOSCOPY WITH PROPOFOL;  Surgeon: Lesly Rubenstein, MD;  Location: [facility] ENDOS[facility];  Service: Endoscopy;  Laterality: N/A;  REQ MID AM   CORONARY ANGIOPLASTY  2005   stent x 1 placed   ENDARTERECTOMY FEMORAL Left 08/11/2017   Procedure: ENDARTERECTOMY FEMORAL;  Surgeon: Algernon Huxley, MD;  Location: [facility];  Service: Vascular;  Laterality: Left;   ESOPHAGOGASTRODUODENOSCOPY N/A 02/19/2020   Procedure: ESOPHAGOGASTRODUODENOSCOPY (EGD);  Surgeon: Lesly Rubenstein, MD;  Location: [facility] ENDOSCOPY;  Service: Endoscopy;  Laterality: N/A;   HEMATOMA EVACUATION Left 01/12/2018   Procedure:  EVACUATION HEMATOMA ( DRAINING OF SEROMA);  Surgeon: Algernon Huxley, MD;  Location: [facility];  Service: Vascular;  Laterality: Left;   IMPLANTABLE CARDIOVERTER DEFIBRILLATOR (ICD) GENERATOR CHANGE Left 02/10/2017   Procedure: ICD GENERATOR CHANGE;  Surgeon: Isaias Cowman, MD;  Location: [facility];  Service: Cardiovascular;  Laterality: Left;   INSERT / REPLACE / REMOVE PACEMAKER  [id number], 2018   LOWER EXTREMITY ANGIOGRAPHY Left 06/07/2017   Procedure: LOWER EXTREMITY ANGIOGRAPHY;  Surgeon: Algernon Huxley, MD;  Location: [place] CV LAB;  Service: Cardiovascular;  Laterality: Left;   LOWER EXTREMITY ANGIOGRAPHY Left 10/05/2019   Procedure: LOWER EXTREMITY ANGIOGRAPHY;  Surgeon: Algernon Huxley, MD;  Location: [place] CV LAB;  Service: Cardiovascular;  Laterality: Left;   LOWER EXTREMITY INTERVENTION  06/07/2017   Procedure: LOWER EXTREMITY INTERVENTION;  Surgeon: Algernon Huxley, MD;  Location: [place] CV LAB;  Service: Cardiovascular;;   PERIPHERAL VASCULAR CATHETERIZATION Left 12/09/2015   Procedure: Lower Extremity Angiography;  Surgeon: Algernon Huxley, MD;  Location: [place] CV LAB;  Service: Cardiovascular;  Laterality: Left;   PERIPHERAL VASCULAR CATHETERIZATION  12/09/2015   Procedure: Lower Extremity Intervention;  Surgeon: Algernon Huxley, MD;  Location: [place] [facility];  Service: Cardiovascular;;   TONSILLECTOMY     TRANSMETATARSAL AMPUTATION Left 10/20/2019   Procedure: TRANSMETATARSAL AMPUTATION;  Surgeon: Sharlotte Alamo, DPM;  Location: [facility];  Service: Podiatry;  Laterality: Left;    Family History  Problem Relation Age of Onset   Cancer Mother    Cancer Father    Heart disease Father     Allergies  Allergen Reactions   Apixaban Rash   Rivaroxaban Rash    CBC Latest Ref Rng & Units 03/15/2020 11/30/2019 11/28/2019  WBC 4.0 - 10.5 K/uL 7.8 7.5 5.9  Hemoglobin 13.0 - 17.0 g/dL 12.1(L) 8.1(L) 8.0(L)  Hematocrit 39.0 - 52.0 % 35.8(L) 24.1(L) 24.3(L)  Platelets 150  - 400 K/uL 239 407(H) 320      CMP     Component Value Date/Time   NA 133 (L) 03/15/2020 1631   NA 134 (L) 01/12/2014 1153   K 3.9 03/15/2020 1631   K 4.0 01/12/2014 1153   CL 95 (L) 03/15/2020 1631   CL 96 (L) 01/12/2014 1153   CO2 29 03/15/2020 1631   CO2 31 01/12/2014 1153   GLUCOSE 100 (H) 03/15/2020 1631   GLUCOSE 97 01/12/2014 1153   BUN 8 03/15/2020 1631   BUN 7 01/12/2014 1153   CREATININE 0.71 03/15/2020 1631   CREATININE 0.92 01/12/2014 1153   CALCIUM 9.2 03/15/2020 1631   CALCIUM 9.2 01/12/2014 1153   PROT 6.6 11/22/2019 1512   PROT 7.3 01/12/2014 1153   ALBUMIN 3.6 11/22/2019 1512   ALBUMIN 4.2 01/12/2014 1153   AST 17 11/22/2019 1512   AST 15 01/12/2014 1153   ALT 11 11/22/2019 1512   ALT 22 01/12/2014 1153   ALKPHOS 67 11/22/2019 1512   ALKPHOS 70 01/12/2014 1153   BILITOT 0.9 11/22/2019 1512   BILITOT 0.5 01/12/2014 1153   GFRNONAA >60 03/15/2020 1631   GFRNONAA >60 01/12/2014 1153   GFRAA >60 11/30/2019 0412   GFRAA >60 01/12/2014 1153     No results found.     Assessment & Plan:   1. Hx of BKA, left (HCC) The redness and blisters around the main wound have resolved.  The main wound itself is also grown increasingly smaller.  The patient is advised to continue with utilizing Aquacel over his wound to help with continued healing.  We will have the patient return 5 weeks for reevaluation as well as noninvasive studies.  2. Atherosclerosis of artery of extremity with rest pain [facility]) The patient does have concerns for coolness in his left stump.  We will perform a lower extremity arterial duplex to ensure there is no vascular compromise.  It is  also likely that this coolness may be related to the patient's perfusion due to his advanced heart failure.  3. Essential hypertension, benign Continue antihypertensive medications as already ordered, these medications have been reviewed and there are no changes at this time.    Current Outpatient  Medications on File Prior to Visit  Medication Sig Dispense Refill   acetaminophen (TYLENOL) 500 MG tablet Take 1,000 mg by mouth daily as needed for moderate pain or headache.     ALPRAZolam (XANAX) 1 MG tablet Take 1 mg by mouth 2 (two) times daily as needed for anxiety or sleep.      amoxicillin-clavulanate (AUGMENTIN) 875-125 MG tablet Take 1 tablet by mouth 2 (two) times daily. 20 tablet 0   aspirin EC 81 MG tablet Take 1 tablet (81 mg total) by mouth daily. (Patient taking differently: Take 81 mg by mouth daily after supper.) 150 tablet 2   azithromycin (ZITHROMAX) 250 MG tablet Take 250 mg by mouth daily. Monday, Wednesday, Friday for 90 days     citalopram (CELEXA) 10 MG tablet Take 10 mg by mouth daily.     docusate sodium (COLACE) 100 MG capsule Take 100 mg by mouth daily as needed (for constipation.).      Dupilumab (DUPIXENT) 300 MG/2ML SOPN Inject 300 mg into the skin every 14 (fourteen) days. Inject 2 mLs (300mg ).     ENTRESTO 24-26 MG Take 1 tablet by mouth 2 (two) times daily. 60 tablet 0   EPINEPHrine 0.3 mg/0.3 mL IJ SOAJ injection Inject into the muscle.     fluticasone (FLONASE) 50 MCG/ACT nasal spray Place 2 sprays into both nostrils daily.     fluticasone-salmeterol (ADVAIR HFA) 115-21 MCG/ACT inhaler USE 2 INHALATIONS ORALLY EVERY 12 HOURS (Patient taking differently: Inhale 2 puffs into the lungs every 12 (twelve) hours.) 36 Inhaler 5   furosemide (LASIX) 20 MG tablet Take 2 tablets (40 mg total) by mouth daily. (Patient taking differently: Take 20 mg by mouth daily.) 60 tablet 0   furosemide (LASIX) 20 MG tablet Take 20 mg by mouth.     gabapentin (NEURONTIN) 100 MG capsule Take 300 mg by mouth in the morning, at noon, and at bedtime.     Gauze Pads & Dressings (RESTORE FOAM DRESSING) 6"X8" PADS by Does not apply route.     guaiFENesin (MUCINEX) 600 MG 12 hr tablet Take 600 mg by mouth every evening.      ipratropium (ATROVENT) 0.03 % nasal spray Place 2 sprays into both  nostrils 2 (two) times daily.      Ipratropium-Albuterol (COMBIVENT) 20-100 MCG/ACT AERS respimat Inhale 1 puff into the lungs every 4 (four) hours.     ipratropium-albuterol (DUONEB) 0.5-2.5 (3) MG/3ML SOLN Inhale into the lungs.     JARDIANCE 10 MG TABS tablet Take 10 mg by mouth daily with breakfast.     levalbuterol (XOPENEX) 1.25 MG/3ML nebulizer solution Take 1.25 mg (3 mLs total) by nebulization every 4 (four) hours. 72 mL 12   loratadine (CLARITIN) 10 MG tablet Take 10 mg by mouth at bedtime.     lovastatin (MEVACOR) 20 MG tablet Take 20 mg by mouth at bedtime.     lovastatin (MEVACOR) 20 MG tablet Take 1 tablet by mouth daily.     metoprolol succinate (TOPROL-XL) 25 MG 24 hr tablet Take 25 mg by mouth daily.     montelukast (SINGULAIR) 10 MG tablet Take 10 mg by mouth at bedtime.     mupirocin ointment (BACTROBAN) 2 %  Apply 1 application topically daily. 22 g 0   nitroGLYCERIN (NITROSTAT) 0.4 MG SL tablet Place 0.4 mg under the tongue every 5 (five) minutes as needed for chest pain.     oxyCODONE-acetaminophen (PERCOCET) 7.5-325 MG tablet Take 1-2 tablets by mouth every 6 (six) hours as needed for moderate pain. 50 tablet 0   oxyCODONE-acetaminophen (PERCOCET) 7.5-325 MG tablet Take by mouth.     pantoprazole (PROTONIX) 40 MG tablet Take 40 mg by mouth daily before breakfast.      predniSONE (DELTASONE) 20 MG tablet Take 20 mg by mouth daily.     Respiratory Therapy Supplies (FLUTTER) DEVI Use as directed. 1 each 0   roflumilast (DALIRESP) 500 MCG TABS tablet Take 250 mcg by mouth daily.     silver sulfADIAZINE (SILVADENE) 1 % cream Apply 1 application topically daily. 50 g 0   SPIRIVA RESPIMAT 1.25 MCG/ACT AERS INHALE 2 PUFFS BY MOUTH INTO THE LUNGS DAILY 4 g 6   tamsulosin (FLOMAX) 0.4 MG CAPS capsule Take 0.4 mg by mouth daily after supper.   11   vitamin B-12 (CYANOCOBALAMIN) 1000 MCG tablet Take 1,000 mcg by mouth daily.     warfarin (COUMADIN) 5 MG tablet Take 5 mg by mouth  daily.     Wound Dressings (RESTORE WOUND CARE DRESSING) PADS Apply 1 each topically daily.     sildenafil (VIAGRA) 25 MG tablet Take by mouth.     No current facility-administered medications on file prior to visit.    There are no Patient Instructions on file for this visit. No follow-ups on file.   Kris Hartmann, NP

## 2021-02-13 DIAGNOSIS — Z79899 Other long term (current) drug therapy: Secondary | ICD-10-CM | POA: Diagnosis not present

## 2021-02-13 DIAGNOSIS — I482 Chronic atrial fibrillation, unspecified: Secondary | ICD-10-CM | POA: Diagnosis not present

## 2021-02-21 DIAGNOSIS — J441 Chronic obstructive pulmonary disease with (acute) exacerbation: Secondary | ICD-10-CM | POA: Diagnosis not present

## 2021-02-21 DIAGNOSIS — J9610 Chronic respiratory failure, unspecified whether with hypoxia or hypercapnia: Secondary | ICD-10-CM | POA: Diagnosis not present

## 2021-02-23 DIAGNOSIS — J441 Chronic obstructive pulmonary disease with (acute) exacerbation: Secondary | ICD-10-CM | POA: Diagnosis not present

## 2021-02-23 DIAGNOSIS — M5000 Cervical disc disorder with myelopathy, unspecified cervical region: Secondary | ICD-10-CM | POA: Diagnosis not present

## 2021-02-25 DIAGNOSIS — J300 Vasomotor rhinitis: Secondary | ICD-10-CM | POA: Diagnosis not present

## 2021-02-25 DIAGNOSIS — H6981 Other specified disorders of Eustachian tube, right ear: Secondary | ICD-10-CM | POA: Diagnosis not present

## 2021-02-26 ENCOUNTER — Telehealth: Payer: Self-pay | Admitting: Primary Care

## 2021-02-26 NOTE — Telephone Encounter (Signed)
T/c to f/u for [facility] services. No answer, message left.

## 2021-03-03 ENCOUNTER — Other Ambulatory Visit (INDEPENDENT_AMBULATORY_CARE_PROVIDER_SITE_OTHER): Payer: Self-pay | Admitting: Nurse Practitioner

## 2021-03-03 DIAGNOSIS — I70229 Atherosclerosis of native arteries of extremities with rest pain, unspecified extremity: Secondary | ICD-10-CM

## 2021-03-03 DIAGNOSIS — Z89512 Acquired absence of left leg below knee: Secondary | ICD-10-CM

## 2021-03-04 ENCOUNTER — Encounter (INDEPENDENT_AMBULATORY_CARE_PROVIDER_SITE_OTHER): Payer: Self-pay | Admitting: Nurse Practitioner

## 2021-03-04 ENCOUNTER — Other Ambulatory Visit: Payer: Self-pay

## 2021-03-04 ENCOUNTER — Ambulatory Visit (INDEPENDENT_AMBULATORY_CARE_PROVIDER_SITE_OTHER): Payer: Medicare HMO | Admitting: Vascular Surgery

## 2021-03-04 ENCOUNTER — Ambulatory Visit (INDEPENDENT_AMBULATORY_CARE_PROVIDER_SITE_OTHER): Payer: Medicare HMO

## 2021-03-04 ENCOUNTER — Ambulatory Visit (INDEPENDENT_AMBULATORY_CARE_PROVIDER_SITE_OTHER): Payer: Medicare HMO | Admitting: Nurse Practitioner

## 2021-03-04 VITALS — BP 115/73 | HR 78 | Resp 16

## 2021-03-04 DIAGNOSIS — I70229 Atherosclerosis of native arteries of extremities with rest pain, unspecified extremity: Secondary | ICD-10-CM

## 2021-03-04 DIAGNOSIS — Z89512 Acquired absence of left leg below knee: Secondary | ICD-10-CM

## 2021-03-04 DIAGNOSIS — I70269 Atherosclerosis of native arteries of extremities with gangrene, unspecified extremity: Secondary | ICD-10-CM | POA: Diagnosis not present

## 2021-03-04 DIAGNOSIS — E782 Mixed hyperlipidemia: Secondary | ICD-10-CM

## 2021-03-06 ENCOUNTER — Other Ambulatory Visit: Payer: Self-pay

## 2021-03-06 ENCOUNTER — Other Ambulatory Visit: Payer: Medicare HMO | Admitting: Primary Care

## 2021-03-06 DIAGNOSIS — Z515 Encounter for palliative care: Secondary | ICD-10-CM

## 2021-03-06 DIAGNOSIS — J449 Chronic obstructive pulmonary disease, unspecified: Secondary | ICD-10-CM

## 2021-03-06 DIAGNOSIS — M5000 Cervical disc disorder with myelopathy, unspecified cervical region: Secondary | ICD-10-CM | POA: Diagnosis not present

## 2021-03-06 DIAGNOSIS — F418 Other specified anxiety disorders: Secondary | ICD-10-CM | POA: Diagnosis not present

## 2021-03-06 DIAGNOSIS — Z89611 Acquired absence of right leg above knee: Secondary | ICD-10-CM

## 2021-03-06 NOTE — Progress Notes (Signed)
[facility] Consult Note Telephone: [phone]  Fax: [phone]    Date of encounter: 03/06/21 11:22 AM PATIENT NAME: Tyler [name] Tyler [name] [id number]   [phone] (home)  DOB: 02-Oct-1955 MRN: [id number] PRIMARY CARE PROVIDER:    [name], MD,  [place] [address] [phone]  REFERRING PROVIDER:   [name], MD [place],  [place] [address] [phone]  RESPONSIBLE PARTY:    Contact Information     Name Relation Home Work [place] Spouse [phone]  [phone]   Thomasenia Bottoms Sister   [phone]   Gerringer, Jenny Reichmann Sister   [phone]        I met face to face with patient and family in  home. Palliative Care was asked to follow this patient by consultation request of  [name], MD to address advance care planning and complex medical decision making. This is a follow up visit.                                   ASSESSMENT AND PLAN / RECOMMENDATIONS:   Advance Care Planning/Goals of Care: Goals include to maximize quality of life and symptom management. Our advance care planning conversation included a discussion about:    The value and importance of advance care planning  Exploration of personal, cultural or spiritual beliefs that might influence medical decisions  Exploration of goals of care in the event of a sudden injury or illness   CODE STATUS: FULL CODE   I reviewed a MOST form today. The patient and family outlined their wishes for the following treatment decisions:  Cardiopulmonary Resuscitation: Attempt Resuscitation (CPR)  Medical Interventions: Full Scope of Treatment: Use intubation, advanced airway interventions, mechanical ventilation, cardioversion as indicated, medical treatment, IV fluids, etc, also provide comfort measures. Transfer to the hospital if indicated  Antibiotics:  Antibiotics if indicated  IV Fluids: IV fluids for a defined trial period  Feeding Tube: No feeding tube    Reviewed current MOST form with patient and family to ensure accurate capture of current directives. Discussed with patient and wife the importance of presenting MOST form in the event patient were to need medical interventions.  Symptom Management/Plan:  Dyspnea:  Endorses some SOB and increased DOE and persistent cough, almost completed Augmentin course for suspected Left BKA infection. Diminished lung sounds bilaterally with mild expiratory wheezing noted upon auscultation. I ordered a prednisone burst 40 mg x 5 days, will call to [facility]. Discussed techniques to assist with patients exertion ie: increasing O2 for transfer needs, upper body strengthening exercise via light weights, and aggressive pulmonary hygiene with nebulizer and inhalers. Discussed potential environmental irritants ie: candles burning, fragrances, mold/mildew. Encouraged use of an  hepa air purifier in the home. Continues on Dupixent and Trilogy at HS.  Wound Management: Has vascular wound on L knee, Rx with foam dressing and Silver Sulfadiazine improving. Per patient has followed with Dr. Lucky Cowboy with [facility] for [place] which demonstrated adequate femoral supply however there was compromised supply to his left bka site. Prosthesis has been discontinued until skin integrity has improved.  Nutrition: Eating well, has started gaining weight.   Follow up Palliative Care Visit: Palliative care will continue to follow for complex medical decision making, advance care planning, and clarification of goals. Return 12 weeks or prn.  I spent 60 minutes providing this consultation. More than 50% of the time in this consultation was spent in counseling and care coordination.  PPS: 40%  HOSPICE ELIGIBILITY/DIAGNOSIS: TBD  Chief Complaint: dyspnea  HISTORY OF PRESENT ILLNESS:  Tyler [name] is a 65 y.o. year old male  with  COPD, PVD, R AKA, L BKA.   History obtained from review of EMR, discussion with primary team, and interview with family, facility staff/caregiver and/or Mr. Sevin.  I reviewed available labs, medications, imaging, studies and related documents from the EMR.  Records reviewed and summarized above.   ROS  General: NAD EYES: denies vision changes ENMT: denies dysphagia Cardiovascular: denies chest pain, endorses DOE and panic Pulmonary: endorses cough, endorses   increased SOB Abdomen: endorses good appetite, denies constipation, endorses continence of bowel GU: denies dysuria, endorses continence of urine MSK:  endorses weakness,  no falls reported, R AKA  in 1996, R BKA in  2021 Skin: reports pressure ulcer on L knee, sore to touch Neurological: denies pain, denies insomnia, endorses UE numbness, due to impingement Psych: Endorses positive mood, anxiety at times Heme/lymph/immuno: denies bruises, abnormal bleeding  Physical Exam: Current and past weights: 152 lbs Constitutional: NAD General: frail appearing, thin EYES: anicteric sclera, lids intact, no discharge  ENMT: intact hearing, oral mucous membranes moist, dentition intact CV: S1S2,  Pulmonary: LCTA, + increased work of breathing, + cough, oxygen on 2.5 l/min Abdomen: intake 100%,  no ascites GU: deferred MSK: + sarcopenia, moves LE amputations, non ambulatory, w/c for ambulation Skin: warm and dry, no rashes or wounds on visible skin Neuro:  + generalized weakness,  no cognitive impairment Psych: non-anxious affect, A and O x 3 Hem/lymph/immuno: no widespread bruising  Outpatient Encounter Medications as of 03/06/2021  Medication Sig   Wound Dressings (RESTORE WOUND CARE DRESSING) PADS Apply 1 each topically daily.   acetaminophen (TYLENOL) 500 MG tablet Take 1,000 mg by mouth daily as needed for moderate pain or headache.   ALPRAZolam (XANAX) 1 MG tablet Take 1 mg by mouth 2 (two) times daily as needed for anxiety  or sleep.    amoxicillin-clavulanate (AUGMENTIN) 875-125 MG tablet Take 1 tablet by mouth 2 (two) times daily.   aspirin EC 81 MG tablet Take 1 tablet (81 mg total) by mouth daily. (Patient taking differently: Take 81 mg by mouth daily after supper.)   azithromycin (ZITHROMAX) 250 MG tablet Take 250 mg by mouth daily. Monday, Wednesday, Friday for 90 days   citalopram (CELEXA) 10 MG tablet Take 10 mg by mouth daily.   docusate sodium (COLACE) 100 MG capsule Take 100 mg by mouth daily as needed (for constipation.).    Dupilumab (DUPIXENT) 300 MG/2ML SOPN Inject 300 mg into the skin every 14 (fourteen) days. Inject 2 mLs (357m).   ENTRESTO 24-26 MG Take 1 tablet by mouth 2 (two) times daily.   EPINEPHrine 0.3 mg/0.3 mL IJ SOAJ injection Inject into the muscle.   fluticasone (FLONASE) 50 MCG/ACT nasal spray Place 2 sprays into both nostrils daily.   fluticasone-salmeterol (ADVAIR HFA) 115-21 MCG/ACT inhaler USE 2 INHALATIONS ORALLY EVERY 12 HOURS (Patient taking differently: Inhale 2 puffs into the lungs every 12 (twelve) hours.)   furosemide (LASIX) 20 MG tablet Take 2 tablets (40 mg total) by mouth daily. (Patient taking differently: Take 20 mg by mouth daily.)   furosemide (LASIX) 20 MG tablet Take 20 mg by mouth.   gabapentin (NEURONTIN) 100 MG capsule Take 300 mg by mouth in the  morning, at noon, and at bedtime.   Gauze Pads & Dressings (RESTORE FOAM DRESSING) 6"X8" PADS by Does not apply route.   guaiFENesin (MUCINEX) 600 MG 12 hr tablet Take 600 mg by mouth every evening.    ipratropium (ATROVENT) 0.03 % nasal spray Place 2 sprays into both nostrils 2 (two) times daily.    Ipratropium-Albuterol (COMBIVENT) 20-100 MCG/ACT AERS respimat Inhale 1 puff into the lungs every 4 (four) hours.   ipratropium-albuterol (DUONEB) 0.5-2.5 (3) MG/3ML SOLN Inhale into the lungs.   JARDIANCE 10 MG TABS tablet Take 10 mg by mouth daily with breakfast.   levalbuterol (XOPENEX) 1.25 MG/3ML nebulizer solution  Take 1.25 mg (3 mLs total) by nebulization every 4 (four) hours.   loratadine (CLARITIN) 10 MG tablet Take 10 mg by mouth at bedtime.   lovastatin (MEVACOR) 20 MG tablet Take 20 mg by mouth at bedtime.   lovastatin (MEVACOR) 20 MG tablet Take 1 tablet by mouth daily.   metoprolol succinate (TOPROL-XL) 25 MG 24 hr tablet Take 25 mg by mouth daily.   montelukast (SINGULAIR) 10 MG tablet Take 10 mg by mouth at bedtime.   mupirocin ointment (BACTROBAN) 2 % Apply 1 application topically daily.   nitroGLYCERIN (NITROSTAT) 0.4 MG SL tablet Place 0.4 mg under the tongue every 5 (five) minutes as needed for chest pain.   oxyCODONE-acetaminophen (PERCOCET) 7.5-325 MG tablet Take 1-2 tablets by mouth every 6 (six) hours as needed for moderate pain.   oxyCODONE-acetaminophen (PERCOCET) 7.5-325 MG tablet Take by mouth.   pantoprazole (PROTONIX) 40 MG tablet Take 40 mg by mouth daily before breakfast.    predniSONE (DELTASONE) 20 MG tablet Take 20 mg by mouth daily.   Respiratory Therapy Supplies (FLUTTER) DEVI Use as directed.   roflumilast (DALIRESP) 500 MCG TABS tablet Take 250 mcg by mouth daily.   sildenafil (VIAGRA) 25 MG tablet Take by mouth.   silver sulfADIAZINE (SILVADENE) 1 % cream Apply 1 application topically daily.   SPIRIVA RESPIMAT 1.25 MCG/ACT AERS INHALE 2 PUFFS BY MOUTH INTO THE LUNGS DAILY   tamsulosin (FLOMAX) 0.4 MG CAPS capsule Take 0.4 mg by mouth daily after supper.    vitamin B-12 (CYANOCOBALAMIN) 1000 MCG tablet Take 1,000 mcg by mouth daily.   warfarin (COUMADIN) 5 MG tablet Take 5 mg by mouth daily.   No facility-administered encounter medications on file as of 03/06/2021.    Thank you for the opportunity to participate in the care of Mr. Bromell.  The palliative care team will continue to follow. Please call our office at [phone] if we can be of additional assistance.   Jason Coop, NP DNP, AGPCNP-BC  COVID-19 PATIENT SCREENING TOOL Asked and negative  response unless otherwise noted:   Have you had symptoms of covid, tested positive or been in contact with someone with symptoms/positive test in the past 5-10 days?

## 2021-03-12 ENCOUNTER — Encounter (INDEPENDENT_AMBULATORY_CARE_PROVIDER_SITE_OTHER): Payer: Self-pay | Admitting: Nurse Practitioner

## 2021-03-12 NOTE — Progress Notes (Signed)
Subjective:    Patient ID: Tyler [name], male    DOB: [birth year], 65 y.o.   MRN: [id number] Chief Complaint  Patient presents with   Follow-up    Ultrasound follow up    Tyler [name] is a 65 year old male that returns today for evaluation of a wound on his left lower extremity.  He previously had a wound in the area of his patella however that has mostly healed.  Is greatly decreased in size since his last visit.    Patient denies any new wounds.  He denies any fever or chills.  She was concerned due to the coolness of his stump but there may be issues with circulation.  The patient does have heart failure with a reduced ejection fraction.  Today noninvasive studies show total occlusion of the superficial femoral artery as well as total occlusion of the popliteal artery.  These are also expected findings after an above-knee amputation.  The patient's distal common femoral artery as well as deep femoral artery are open and patent.   Review of Systems  Musculoskeletal:  Positive for gait problem.  All other systems reviewed and are negative.     Objective:   Physical Exam Vitals reviewed.  HENT:     Head: Normocephalic.  Cardiovascular:     Rate and Rhythm: Normal rate.  Pulmonary:     Effort: Pulmonary effort is normal.  Musculoskeletal:     Right Lower Extremity: Right leg is amputated above knee.     Left Lower Extremity: Left leg is amputated below knee.  Skin:    General: Skin is dry.  Neurological:     Mental Status: He is alert and oriented to person, place, and time.     Motor: Weakness present.     Gait: Gait abnormal.  Psychiatric:        Mood and Affect: Mood normal.        Behavior: Behavior normal.        Thought Content: Thought content normal.        Judgment: Judgment normal.    BP 115/73 (BP Location: Left Arm)   Pulse 78   Resp 16   Past Medical History:  Diagnosis Date   AICD (automatic cardioverter/defibrillator) present    Anxiety     Arthritis    Atherosclerosis of artery of extremity with ulceration ([place]) 09/2019   left foot s/p toe amp requiring debridement and futher toe amputations.   Atrial fibrillation (HCC)    Cervical spinal stenosis    with neuropathy   CHF (congestive heart failure) (HCC)    Constipation    COPD (chronic obstructive pulmonary disease) (HCC)    Coronary artery disease    Depression    Dyspnea    Dysrhythmia    atrial fibrillation   Emphysema of lung (HCC)    Factor 5 Leiden mutation, heterozygous ([place])    on coumadin   Factor V Leiden mutation ([place])    GERD (gastroesophageal reflux disease)    Hypertension    Lung nodule seen on imaging study    being followed by dr. [name]. just watching it for last few years, without change   Mitral valve insufficiency    Moderate tricuspid insufficiency    Myocardial infarction [facility]) 2004   stent placed, pacemaker implanted 2005   Osteomyelitis ([place]) 10/2019   left foot   Oxygen dependent    requires 2L nasal prong oxygen 24 hours a day   Peripheral vascular disease ([place])  Presence of permanent cardiac pacemaker [phone]   PVD (peripheral vascular disease) ([place])    Sleep apnea    waiting to have sleep study. Used bipap while hospitalized and said it was great for him.    Social History   Socioeconomic History   Marital status: Married    Spouse name: Bethena Roys   Number of children: Not on file   Years of education: Not on file   Highest education level: Not on file  Occupational History   Occupation: no employment    Comment: disabled  Tobacco Use   Smoking status: Former    Packs/day: 1.00    Years: 42.00    Pack years: 42.00    Types: Cigarettes    Quit date: 09/23/2013    Years since quitting: 7.4   Smokeless tobacco: Never  Vaping Use   Vaping Use: Never used  Substance and Sexual Activity   Alcohol use: No    Comment: quit 6 years ago   Drug use: No   Sexual activity: Yes  Other Topics Concern   Not on file  Social  History Narrative   Patient lives with wife and a dog.   Social Determinants of Health   Financial Resource Strain: Not on file  Food Insecurity: No Food Insecurity   Worried About Charity fundraiser in the Last Year: Never true   Ran Out of Food in the Last Year: Never true  Transportation Needs: No Transportation Needs   Lack of Transportation (Medical): No   Lack of Transportation (Non-Medical): No  Physical Activity: Not on file  Stress: Not on file  Social Connections: Not on file  Intimate Partner Violence: Not on file    Past Surgical History:  Procedure Laterality Date   ABOVE KNEE LEG AMPUTATION Right    after below the knee amputation    AMPUTATION Left 11/22/2019   Procedure: AMPUTATION BELOW KNEE;  Surgeon: Algernon Huxley, MD;  Location: [facility] ORS;  Service: Vascular;  Laterality: Left;   AMPUTATION TOE Left 08/04/2019   Procedure: AMPUTATION TOE MPJ LEFT;  Surgeon: Samara Deist, DPM;  Location: [facility] OR[facility];  Service: Podiatry;  Laterality: Left;   AMPUTATION TOE Left 10/06/2019   Procedure: AMPUTATION TOE MPJ T1,T2 LEFT;  Surgeon: Samara Deist, DPM;  Location: [facility] OR[facility];  Service: Podiatry;  Laterality: Left;   APPLICATION OF WOUND VAC Left 01/12/2018   Procedure: APPLICATION OF WOUND VAC;  Surgeon: Algernon Huxley, MD;  Location: [facility] OR[facility];  Service: Vascular;  Laterality: Left;   BELOW KNEE LEG AMPUTATION Right    CATARACT EXTRACTION, BILATERAL Bilateral    COLONOSCOPY N/A 02/19/2020   Procedure: COLONOSCOPY;  Surgeon: Lesly Rubenstein, MD;  Location: [facility] ENDOSCOPY;  Service: Endoscopy;  Laterality: N/A;   COLONOSCOPY WITH PROPOFOL N/A 08/27/2020   Procedure: COLONOSCOPY WITH PROPOFOL;  Surgeon: Lesly Rubenstein, MD;  Location: [facility] ENDOSCOPY;  Service: Endoscopy;  Laterality: N/A;  REQ MID AM   CORONARY ANGIOPLASTY  2005   stent x 1 placed   ENDARTERECTOMY FEMORAL Left 08/11/2017   Procedure: ENDARTERECTOMY FEMORAL;  Surgeon: Algernon Huxley, MD;  Location: [facility];   Service: Vascular;  Laterality: Left;   ESOPHAGOGASTRODUODENOSCOPY N/A 02/19/2020   Procedure: ESOPHAGOGASTRODUODENOSCOPY (EGD);  Surgeon: Lesly Rubenstein, MD;  Location: [facility] ENDOSCOPY;  Service: Endoscopy;  Laterality: N/A;   HEMATOMA EVACUATION Left 01/12/2018   Procedure: EVACUATION HEMATOMA ( DRAINING OF SEROMA);  Surgeon: Algernon Huxley, MD;  Location: [facility];  Service: Vascular;  Laterality: Left;   IMPLANTABLE CARDIOVERTER DEFIBRILLATOR (ICD) GENERATOR CHANGE Left 02/10/2017   Procedure: ICD GENERATOR CHANGE;  Surgeon: Isaias Cowman, MD;  Location: [facility];  Service: Cardiovascular;  Laterality: Left;   INSERT / REPLACE / REMOVE PACEMAKER  [id number], 2018   LOWER EXTREMITY ANGIOGRAPHY Left 06/07/2017   Procedure: LOWER EXTREMITY ANGIOGRAPHY;  Surgeon: Algernon Huxley, MD;  Location: [place] CV LAB;  Service: Cardiovascular;  Laterality: Left;   LOWER EXTREMITY ANGIOGRAPHY Left 10/05/2019   Procedure: LOWER EXTREMITY ANGIOGRAPHY;  Surgeon: Algernon Huxley, MD;  Location: [place] CV LAB;  Service: Cardiovascular;  Laterality: Left;   LOWER EXTREMITY INTERVENTION  06/07/2017   Procedure: LOWER EXTREMITY INTERVENTION;  Surgeon: Algernon Huxley, MD;  Location: [place] CV LAB;  Service: Cardiovascular;;   PERIPHERAL VASCULAR CATHETERIZATION Left 12/09/2015   Procedure: Lower Extremity Angiography;  Surgeon: Algernon Huxley, MD;  Location: [place] CV LAB;  Service: Cardiovascular;  Laterality: Left;   PERIPHERAL VASCULAR CATHETERIZATION  12/09/2015   Procedure: Lower Extremity Intervention;  Surgeon: Algernon Huxley, MD;  Location: [place] CV LAB;  Service: Cardiovascular;;   TONSILLECTOMY     TRANSMETATARSAL AMPUTATION Left 10/20/2019   Procedure: TRANSMETATARSAL AMPUTATION;  Surgeon: Sharlotte Alamo, DPM;  Location: [facility];  Service: Podiatry;  Laterality: Left;    Family History  Problem Relation Age of Onset   Cancer Mother    Cancer Father    Heart disease Father      Allergies  Allergen Reactions   Apixaban Rash   Rivaroxaban Rash    CBC Latest Ref Rng & Units 03/15/2020 11/30/2019 11/28/2019  WBC 4.0 - 10.5 K/uL 7.8 7.5 5.9  Hemoglobin 13.0 - 17.0 g/dL 12.1(L) 8.1(L) 8.0(L)  Hematocrit 39.0 - 52.0 % 35.8(L) 24.1(L) 24.3(L)  Platelets 150 - 400 K/uL 239 407(H) 320      CMP     Component Value Date/Time   NA 133 (L) 03/15/2020 1631   NA 134 (L) 01/12/2014 1153   K 3.9 03/15/2020 1631   K 4.0 01/12/2014 1153   CL 95 (L) 03/15/2020 1631   CL 96 (L) 01/12/2014 1153   CO2 29 03/15/2020 1631   CO2 31 01/12/2014 1153   GLUCOSE 100 (H) 03/15/2020 1631   GLUCOSE 97 01/12/2014 1153   BUN 8 03/15/2020 1631   BUN 7 01/12/2014 1153   CREATININE 0.71 03/15/2020 1631   CREATININE 0.92 01/12/2014 1153   CALCIUM 9.2 03/15/2020 1631   CALCIUM 9.2 01/12/2014 1153   PROT 6.6 11/22/2019 1512   PROT 7.3 01/12/2014 1153   ALBUMIN 3.6 11/22/2019 1512   ALBUMIN 4.2 01/12/2014 1153   AST 17 11/22/2019 1512   AST 15 01/12/2014 1153   ALT 11 11/22/2019 1512   ALT 22 01/12/2014 1153   ALKPHOS 67 11/22/2019 1512   ALKPHOS 70 01/12/2014 1153   BILITOT 0.9 11/22/2019 1512   BILITOT 0.5 01/12/2014 1153   GFRNONAA >60 03/15/2020 1631   GFRNONAA >60 01/12/2014 1153   GFRAA >60 11/30/2019 0412   GFRAA >60 01/12/2014 1153     No results found.     Assessment & Plan:   1. Hx of BKA, left ([place]) The wound is very superficial and nearly healed.  Patient is advised to continue with current wound care regimen.  He is advised to follow-up if the wound fails to heal or suddenly worsens.  2. 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  The patient's profunda as well as common femoral artery are open and patent.  The patient's coolness from his stump is likely related to changes related to his cardiovascular  status.  His wound is also healing with appropriate wound care.  The patient will follow-up with [place] on an as-needed basis if he begins to have discoloration or new wounds and ulcers of his stump.  3. Mixed hyperlipidemia Continue statin as ordered and reviewed, no changes at this time    Current Outpatient Medications on File Prior to Visit  Medication Sig Dispense Refill   acetaminophen (TYLENOL) 500 MG tablet Take 1,000 mg by mouth daily as needed for moderate pain or headache.     ALPRAZolam (XANAX) 1 MG tablet Take 1 mg by mouth 2 (two) times daily as needed for anxiety or sleep.      amoxicillin-clavulanate (AUGMENTIN) 875-125 MG tablet Take 1 tablet by mouth 2 (two) times daily. 20 tablet 0   aspirin EC 81 MG tablet Take 1 tablet (81 mg total) by mouth daily. (Patient taking differently: Take 81 mg by mouth daily after supper.) 150 tablet 2   azithromycin (ZITHROMAX) 250 MG tablet Take 250 mg by mouth daily. Monday, Wednesday, Friday for 90 days     citalopram (CELEXA) 10 MG tablet Take 10 mg by mouth daily.     docusate sodium (COLACE) 100 MG capsule Take 100 mg by mouth daily as needed (for constipation.).      Dupilumab (DUPIXENT) 300 MG/2ML SOPN Inject 300 mg into the skin every 14 (fourteen) days. Inject 2 mLs (300mg ).     ENTRESTO 24-26 MG Take 1 tablet by mouth 2 (two) times daily. 60 tablet 0   EPINEPHrine 0.3 mg/0.3 mL IJ SOAJ injection Inject into the muscle.     fluticasone (FLONASE) 50 MCG/ACT nasal spray Place 2 sprays into both nostrils daily.     fluticasone-salmeterol (ADVAIR HFA) 115-21 MCG/ACT inhaler USE 2 INHALATIONS ORALLY EVERY 12 HOURS (Patient taking differently: Inhale 2 puffs into the lungs every 12 (twelve) hours.) 36 Inhaler 5   furosemide (LASIX) 20 MG tablet Take 2 tablets (40 mg total) by mouth daily. (Patient taking differently: Take 20 mg by mouth daily.) 60 tablet 0   furosemide (LASIX) 20 MG tablet Take 20 mg by mouth.     gabapentin (NEURONTIN) 100 MG  capsule Take 300 mg by mouth in the morning, at noon, and at bedtime.     Gauze Pads & Dressings (RESTORE FOAM DRESSING) 6"X8" PADS by Does not apply route.     guaiFENesin (MUCINEX) 600 MG 12 hr tablet Take 600 mg by mouth every evening.      ipratropium (ATROVENT) 0.03 % nasal spray Place 2 sprays into both nostrils 2 (two) times daily.      Ipratropium-Albuterol (COMBIVENT) 20-100 MCG/ACT AERS respimat Inhale 1 puff into the lungs every 4 (four) hours.     ipratropium-albuterol (DUONEB) 0.5-2.5 (3) MG/3ML SOLN Inhale into the lungs.     JARDIANCE 10 MG TABS tablet Take 10 mg by mouth daily with breakfast.     levalbuterol (XOPENEX) 1.25 MG/3ML nebulizer solution Take 1.25 mg (3 mLs total) by nebulization every 4 (four) hours. 72 mL 12   loratadine (CLARITIN) 10 MG tablet Take 10 mg by mouth at bedtime.     lovastatin (MEVACOR) 20 MG tablet Take 20 mg by mouth at bedtime.     lovastatin (MEVACOR) 20  MG tablet Take 1 tablet by mouth daily.     metoprolol succinate (TOPROL-XL) 25 MG 24 hr tablet Take 25 mg by mouth daily.     montelukast (SINGULAIR) 10 MG tablet Take 10 mg by mouth at bedtime.     mupirocin ointment (BACTROBAN) 2 % Apply 1 application topically daily. 22 g 0   nitroGLYCERIN (NITROSTAT) 0.4 MG SL tablet Place 0.4 mg under the tongue every 5 (five) minutes as needed for chest pain.     oxyCODONE-acetaminophen (PERCOCET) 7.5-325 MG tablet Take 1-2 tablets by mouth every 6 (six) hours as needed for moderate pain. 50 tablet 0   oxyCODONE-acetaminophen (PERCOCET) 7.5-325 MG tablet Take by mouth.     pantoprazole (PROTONIX) 40 MG tablet Take 40 mg by mouth daily before breakfast.      predniSONE (DELTASONE) 20 MG tablet Take 20 mg by mouth daily.     Respiratory Therapy Supplies (FLUTTER) DEVI Use as directed. 1 each 0   roflumilast (DALIRESP) 500 MCG TABS tablet Take 250 mcg by mouth daily.     silver sulfADIAZINE (SILVADENE) 1 % cream Apply 1 application topically daily. 50 g 0    SPIRIVA RESPIMAT 1.25 MCG/ACT AERS INHALE 2 PUFFS BY MOUTH INTO THE LUNGS DAILY 4 g 6   tamsulosin (FLOMAX) 0.4 MG CAPS capsule Take 0.4 mg by mouth daily after supper.   11   vitamin B-12 (CYANOCOBALAMIN) 1000 MCG tablet Take 1,000 mcg by mouth daily.     warfarin (COUMADIN) 5 MG tablet Take 5 mg by mouth daily.     Wound Dressings (RESTORE WOUND CARE DRESSING) PADS Apply 1 each topically daily.     sildenafil (VIAGRA) 25 MG tablet Take by mouth.     No current facility-administered medications on file prior to visit.    There are no Patient Instructions on file for this visit. No follow-ups on file.   Kris Hartmann, NP

## 2021-03-19 DIAGNOSIS — T45515A Adverse effect of anticoagulants, initial encounter: Secondary | ICD-10-CM | POA: Diagnosis not present

## 2021-03-19 DIAGNOSIS — I5022 Chronic systolic (congestive) heart failure: Secondary | ICD-10-CM | POA: Diagnosis not present

## 2021-03-19 DIAGNOSIS — D6832 Hemorrhagic disorder due to extrinsic circulating anticoagulants: Secondary | ICD-10-CM | POA: Diagnosis not present

## 2021-03-19 DIAGNOSIS — I10 Essential (primary) hypertension: Secondary | ICD-10-CM | POA: Diagnosis not present

## 2021-03-21 DIAGNOSIS — I482 Chronic atrial fibrillation, unspecified: Secondary | ICD-10-CM | POA: Diagnosis not present

## 2021-03-23 DIAGNOSIS — J9610 Chronic respiratory failure, unspecified whether with hypoxia or hypercapnia: Secondary | ICD-10-CM | POA: Diagnosis not present

## 2021-03-23 DIAGNOSIS — J441 Chronic obstructive pulmonary disease with (acute) exacerbation: Secondary | ICD-10-CM | POA: Diagnosis not present

## 2021-03-25 DIAGNOSIS — J300 Vasomotor rhinitis: Secondary | ICD-10-CM | POA: Diagnosis not present

## 2021-03-25 DIAGNOSIS — H6981 Other specified disorders of Eustachian tube, right ear: Secondary | ICD-10-CM | POA: Diagnosis not present

## 2021-03-28 DIAGNOSIS — R791 Abnormal coagulation profile: Secondary | ICD-10-CM | POA: Diagnosis not present

## 2021-04-01 DIAGNOSIS — Z23 Encounter for immunization: Secondary | ICD-10-CM | POA: Diagnosis not present

## 2021-04-01 DIAGNOSIS — I482 Chronic atrial fibrillation, unspecified: Secondary | ICD-10-CM | POA: Diagnosis not present

## 2021-04-01 DIAGNOSIS — I10 Essential (primary) hypertension: Secondary | ICD-10-CM | POA: Diagnosis not present

## 2021-04-01 DIAGNOSIS — J9611 Chronic respiratory failure with hypoxia: Secondary | ICD-10-CM | POA: Diagnosis not present

## 2021-04-01 DIAGNOSIS — Z89611 Acquired absence of right leg above knee: Secondary | ICD-10-CM | POA: Diagnosis not present

## 2021-04-01 DIAGNOSIS — I2510 Atherosclerotic heart disease of native coronary artery without angina pectoris: Secondary | ICD-10-CM | POA: Diagnosis not present

## 2021-04-01 DIAGNOSIS — I70229 Atherosclerosis of native arteries of extremities with rest pain, unspecified extremity: Secondary | ICD-10-CM | POA: Diagnosis not present

## 2021-04-01 DIAGNOSIS — D6851 Activated protein C resistance: Secondary | ICD-10-CM | POA: Diagnosis not present

## 2021-04-01 DIAGNOSIS — J431 Panlobular emphysema: Secondary | ICD-10-CM | POA: Diagnosis not present

## 2021-04-04 DIAGNOSIS — I482 Chronic atrial fibrillation, unspecified: Secondary | ICD-10-CM | POA: Diagnosis not present

## 2021-04-10 DIAGNOSIS — I482 Chronic atrial fibrillation, unspecified: Secondary | ICD-10-CM | POA: Diagnosis not present

## 2021-04-21 ENCOUNTER — Telehealth: Payer: Self-pay | Admitting: Family

## 2021-05-06 ENCOUNTER — Other Ambulatory Visit: Payer: Self-pay

## 2021-05-06 NOTE — Patient Outreach (Signed)
[facility]) Care Management  05/06/2021  SHANTE MAYSONET Jun 05, 1955 [id number]   Telephone call to patient for disease management follow up.   No answer.  HIPAA compliant voice message left.    Plan: If no return call, RN CM will attempt patient again in the month of March.  [name], RN, MSN [place] Management Care Management Coordinator Direct Line [phone] Cell [phone] Toll Free: [phone]  Fax: [phone]

## 2021-05-09 DIAGNOSIS — I482 Chronic atrial fibrillation, unspecified: Secondary | ICD-10-CM | POA: Diagnosis not present

## 2021-05-20 DIAGNOSIS — J441 Chronic obstructive pulmonary disease with (acute) exacerbation: Secondary | ICD-10-CM | POA: Diagnosis not present

## 2021-05-20 DIAGNOSIS — J449 Chronic obstructive pulmonary disease, unspecified: Secondary | ICD-10-CM | POA: Diagnosis not present

## 2021-05-29 DIAGNOSIS — I482 Chronic atrial fibrillation, unspecified: Secondary | ICD-10-CM | POA: Diagnosis not present

## 2021-06-02 ENCOUNTER — Telehealth: Payer: Self-pay | Admitting: Family

## 2021-06-02 NOTE — Telephone Encounter (Signed)
LVM to notify patient that we need him to come in to renew his [employer] application and bring proof of income.   Shaunte Tuft, NT

## 2021-06-05 ENCOUNTER — Other Ambulatory Visit: Payer: Self-pay

## 2021-06-05 ENCOUNTER — Other Ambulatory Visit: Payer: Medicare HMO | Admitting: Primary Care

## 2021-06-05 VITALS — BP 130/67 | HR 76 | Resp 22 | Ht 73.0 in | Wt 155.0 lb

## 2021-06-05 DIAGNOSIS — Z89512 Acquired absence of left leg below knee: Secondary | ICD-10-CM

## 2021-06-05 DIAGNOSIS — J449 Chronic obstructive pulmonary disease, unspecified: Secondary | ICD-10-CM

## 2021-06-05 DIAGNOSIS — Z515 Encounter for palliative care: Secondary | ICD-10-CM

## 2021-06-05 DIAGNOSIS — I5022 Chronic systolic (congestive) heart failure: Secondary | ICD-10-CM

## 2021-06-05 NOTE — Progress Notes (Signed)
[facility] Palliative Care Consult Note Telephone: [phone]  Fax: [phone]    Date of encounter: 06/05/21 11:18 AM PATIENT NAME: Tyler [name] [address] [place] [address]   [phone] (home)  DOB: August 31, 1955 MRN: [id number] PRIMARY CARE PROVIDER:    [name], Tyler [name],  [place] [address] [phone]  REFERRING PROVIDER:   [name], Tyler [name] [address],  [place] [address] [phone]  RESPONSIBLE PARTY:    Contact Information     Name Relation Home Work [place] Spouse [phone]  [phone]   Tyler [name] Sister   [phone]   Tyler [name], Tyler [name] Sister   [phone]       I met face to face with patient and family in  home. Palliative Care was asked to follow this patient by consultation request of  Tyler Hire, Tyler [name] to address advance care planning and complex medical decision making. This is a follow up visit.                                   ASSESSMENT AND PLAN / RECOMMENDATIONS:   Advance Care Planning/Goals of Care: Goals include to maximize quality of life and symptom management. Patient/health care surrogate gave his/her permission to discuss.Our advance care planning conversation included a discussion about:    The value and importance of advance care planning  Exploration of personal, cultural or spiritual beliefs that might influence medical decisions  Exploration of goals of care in the event of a sudden injury or illness  Identification of a healthcare agent- wife Reviewed MOST , no changes. Uploaded to vynca CODE STATUS: FULL CODE, wants full measures until futile  Symptom Management/Plan:  Dyspnea: Being rx for copd exacerbation. States he is feeling some better. Discussed medication indications and to call pulmonology if he's not better by next week. Has some production which could be bronchitis. Encouraged to drink water,  use mucinex. Lungs are clear and moving air at his baseline. Using NIV at hs with good subjective results.Has DOE and poor endurance. Imaging not available from last week   Med review: Reviewed and updated, education provided. He is compliant with his current regimen.  Mobility: Stump wounds have cleared and he is using his prosthesis on the L now. This allows him to help ambulate the walker and he hopes to gain some strength enough to use his prosthesis for increased mobility. Currently using sliding board, no falls.  Follow up Palliative Care Visit: Palliative care will continue to follow for complex medical decision making, advance care planning, and clarification of goals. Return  4 months or prn.  This visit was coded based on medical decision making (MDM).  PPS: 50%  HOSPICE ELIGIBILITY/DIAGNOSIS: TBD  Chief Complaint: dyspnea   HISTORY OF PRESENT ILLNESS:  Tyler [name] is a 66 y.o. year old male  with COPD, PAD, amputations, immobility. Seen today for management of chronic illness and palliative goals.  History obtained from review of EMR, discussion with primary team, and interview with family, facility staff/caregiver and/or Tyler [name].  I reviewed available labs, medications, imaging, studies and related documents from the EMR.  Records reviewed and summarized above.   ROS  General: NAD EYES: denies vision changes ENMT: denies dysphagia Cardiovascular: denies chest pain, endorses DOE Pulmonary: endorses productive (yellow) cough, denies increased SOB Abdomen: endorses good appetite, denies  constipation, endorses continence of bowel GU: denies dysuria, endorses continence of urine MSK:  denies increased weakness,  no falls reported, non ambulatory Skin: denies rashes or wounds, no sores on stump Neurological: denies pain, denies insomnia Psych: Endorses positive mood Heme/lymph/immuno: denies bruises, abnormal bleeding  Physical Exam: Current and past weights: 155  lbs Constitutional: NAD. Today's Vitals   06/05/21 1150  BP: 130/67  Pulse: 76  Resp: (!) 22  Weight: 155 lb (70.3 kg)  Height: '6\' 1"'  (1.854 m)   Body mass index is 20.45 kg/m.  General: frail appearing, WNWD EYES: anicteric sclera, lids intact, no discharge  ENMT: intact hearing, oral mucous membranes moist CV: S1S2, RRR Pulmonary: LCTA with diminished sounds, + increased work of breathing, + cough, supplemental oxygen per [place] Abdomen: intake 100%, normo-active BS + 4 quadrants, soft and non tender, no ascites GU: deferred MSK: ++ sarcopenia, moves all extremities, non ambulatory Skin: warm and dry, no rashes or wounds on visible skin Neuro:  + generalized weakness,  no cognitive impairment Psych: non-anxious affect, A and O x 3 Hem/lymph/immuno: no widespread bruising  Thank you for the opportunity to participate in the care of Tyler [name].  The palliative care team will continue to follow. Please call our office at [phone] if we can be of additional assistance.   Jason Coop, NP DNP, AGPCNP-BC  COVID-19 PATIENT SCREENING TOOL Asked and negative response unless otherwise noted:   Have you had symptoms of covid, tested positive or been in contact with someone with symptoms/positive test in the past 5-10 days?

## 2021-06-25 DIAGNOSIS — I482 Chronic atrial fibrillation, unspecified: Secondary | ICD-10-CM | POA: Diagnosis not present

## 2021-06-25 DIAGNOSIS — I10 Essential (primary) hypertension: Secondary | ICD-10-CM | POA: Diagnosis not present

## 2021-07-01 ENCOUNTER — Telehealth: Payer: Self-pay | Admitting: Family

## 2021-07-01 NOTE — Telephone Encounter (Signed)
Called patient reminding him of his renewal application for [facility] and patient stated he already has it renewed for the year and has been getting his medication. I told him if something changes to call me.   Felton Buczynski, NT

## 2021-07-02 DIAGNOSIS — F320 Major depressive disorder, single episode, mild: Secondary | ICD-10-CM | POA: Diagnosis not present

## 2021-07-02 DIAGNOSIS — J9611 Chronic respiratory failure with hypoxia: Secondary | ICD-10-CM | POA: Diagnosis not present

## 2021-07-02 DIAGNOSIS — J431 Panlobular emphysema: Secondary | ICD-10-CM | POA: Diagnosis not present

## 2021-07-02 DIAGNOSIS — Z1389 Encounter for screening for other disorder: Secondary | ICD-10-CM | POA: Diagnosis not present

## 2021-07-02 DIAGNOSIS — Z0000 Encounter for general adult medical examination without abnormal findings: Secondary | ICD-10-CM | POA: Diagnosis not present

## 2021-07-02 DIAGNOSIS — I10 Essential (primary) hypertension: Secondary | ICD-10-CM | POA: Diagnosis not present

## 2021-07-02 DIAGNOSIS — Z7901 Long term (current) use of anticoagulants: Secondary | ICD-10-CM | POA: Diagnosis not present

## 2021-07-02 DIAGNOSIS — I70219 Atherosclerosis of native arteries of extremities with intermittent claudication, unspecified extremity: Secondary | ICD-10-CM | POA: Diagnosis not present

## 2021-07-02 DIAGNOSIS — I2510 Atherosclerotic heart disease of native coronary artery without angina pectoris: Secondary | ICD-10-CM | POA: Diagnosis not present

## 2021-07-08 DIAGNOSIS — I5022 Chronic systolic (congestive) heart failure: Secondary | ICD-10-CM | POA: Diagnosis not present

## 2021-07-15 DIAGNOSIS — I5022 Chronic systolic (congestive) heart failure: Secondary | ICD-10-CM | POA: Diagnosis not present

## 2021-07-15 DIAGNOSIS — I2510 Atherosclerotic heart disease of native coronary artery without angina pectoris: Secondary | ICD-10-CM | POA: Diagnosis not present

## 2021-07-15 DIAGNOSIS — I10 Essential (primary) hypertension: Secondary | ICD-10-CM | POA: Diagnosis not present

## 2021-07-15 DIAGNOSIS — R0602 Shortness of breath: Secondary | ICD-10-CM | POA: Diagnosis not present

## 2021-07-15 DIAGNOSIS — I482 Chronic atrial fibrillation, unspecified: Secondary | ICD-10-CM | POA: Diagnosis not present

## 2021-07-15 DIAGNOSIS — I70229 Atherosclerosis of native arteries of extremities with rest pain, unspecified extremity: Secondary | ICD-10-CM | POA: Diagnosis not present

## 2021-07-15 DIAGNOSIS — E782 Mixed hyperlipidemia: Secondary | ICD-10-CM | POA: Diagnosis not present

## 2021-07-15 DIAGNOSIS — I70219 Atherosclerosis of native arteries of extremities with intermittent claudication, unspecified extremity: Secondary | ICD-10-CM | POA: Diagnosis not present

## 2021-07-30 DIAGNOSIS — I482 Chronic atrial fibrillation, unspecified: Secondary | ICD-10-CM | POA: Diagnosis not present

## 2021-07-30 DIAGNOSIS — Z7901 Long term (current) use of anticoagulants: Secondary | ICD-10-CM | POA: Diagnosis not present

## 2021-07-31 ENCOUNTER — Other Ambulatory Visit: Payer: Self-pay

## 2021-07-31 NOTE — Patient Outreach (Signed)
[facility]) Care Management ? ?07/31/2021 ? ?Joneen Boers A Feldhaus ?Mar 26, 1956 ?[id number] ? ? ?Telephone call to patient for follow up. Patient doing ok.  COPD management continues.  Patient voices no concerns.   ? ?Care Plan : COPD (Adult)  ?Updates made by Jon Billings, RN since 07/31/2021 12:00 AM  ?  ? ?Problem: Symptom Exacerbation (COPD)   ?  ? ?Long-Range Goal: Symptom Exacerbation Prevented or Minimized as evidenced by no COPD exacerbations   ?Start Date: 03/04/2020  ?Expected End Date: 11/21/2021  ?This Visit's Progress: On track  ?Recent Progress: On track  ?Priority: High  ?Note:   ?Evidence-based guidance:  ? Promote physical activity or exercise to improve or maintain exercise capacity,  ?based on tolerance that may include walking, water exercise, cycling or limb  ?muscle strength training. ? Promote use of energy conservation and activity pacing techniques.Monitor for signs of respiratory infection, including changes in sputum color, volume and thickness, as well as fever.  ?Encourage infection prevention strategies that may include prophylactic antibiotic therapy for patients with history of frequent exacerbations or antibiotic administration during exacerbation based on presentation, risk and benefit.  ?Notes:  ?  ? ?Task: Identify and Minimize Risk of COPD Exacerbation   ?Due Date: 11/21/2021  ?Priority: Routine  ?Responsible User: Jon Billings, RN  ?Note:   ?Care Management Activities:  ?Reiterated: ?  ?- healthy lifestyle promoted ?- rescue (action) plan reviewed  ?  ?Notes: 08/06/20 patient has not had any exacerbation of COPD.  ?11/05/20 Patient doing pulmonary rehab.  He reports that is it tiring and not sure if he will be able to complete all 36 weeks.  Discussed home strengthening exercises.   ?02/04/21 Patient reports breathing about the same. Patient did not finish pulmonary rehab due not being able to handle the activity.  Discussed COPD management.  No concerns.  ?07/31/21 Patient  continues with COPD management.   ?  ? ?Plan:  Follow-up: Patient agrees to Care Plan and Follow-up. ?Follow-up in 3 month(s) ? ?[name], RN, MSN ?[facility] Care Management ?Care Management Coordinator ?Direct Line [phone] ?Toll Free: [phone]  ?Fax: [phone] ? ?

## 2021-08-06 DIAGNOSIS — I482 Chronic atrial fibrillation, unspecified: Secondary | ICD-10-CM | POA: Diagnosis not present

## 2021-08-15 DIAGNOSIS — I5022 Chronic systolic (congestive) heart failure: Secondary | ICD-10-CM | POA: Diagnosis not present

## 2021-08-15 DIAGNOSIS — R0602 Shortness of breath: Secondary | ICD-10-CM | POA: Diagnosis not present

## 2021-08-15 DIAGNOSIS — R062 Wheezing: Secondary | ICD-10-CM | POA: Diagnosis not present

## 2021-08-18 DIAGNOSIS — J439 Emphysema, unspecified: Secondary | ICD-10-CM | POA: Diagnosis not present

## 2021-08-18 DIAGNOSIS — J441 Chronic obstructive pulmonary disease with (acute) exacerbation: Secondary | ICD-10-CM | POA: Diagnosis not present

## 2021-08-18 DIAGNOSIS — I517 Cardiomegaly: Secondary | ICD-10-CM | POA: Diagnosis not present

## 2021-08-18 DIAGNOSIS — J449 Chronic obstructive pulmonary disease, unspecified: Secondary | ICD-10-CM | POA: Diagnosis not present

## 2021-08-27 DIAGNOSIS — I10 Essential (primary) hypertension: Secondary | ICD-10-CM | POA: Diagnosis not present

## 2021-08-27 DIAGNOSIS — I071 Rheumatic tricuspid insufficiency: Secondary | ICD-10-CM | POA: Diagnosis not present

## 2021-08-27 DIAGNOSIS — I70229 Atherosclerosis of native arteries of extremities with rest pain, unspecified extremity: Secondary | ICD-10-CM | POA: Diagnosis not present

## 2021-08-27 DIAGNOSIS — I2510 Atherosclerotic heart disease of native coronary artery without angina pectoris: Secondary | ICD-10-CM | POA: Diagnosis not present

## 2021-08-27 DIAGNOSIS — I482 Chronic atrial fibrillation, unspecified: Secondary | ICD-10-CM | POA: Diagnosis not present

## 2021-08-27 DIAGNOSIS — I70219 Atherosclerosis of native arteries of extremities with intermittent claudication, unspecified extremity: Secondary | ICD-10-CM | POA: Diagnosis not present

## 2021-08-27 DIAGNOSIS — I5022 Chronic systolic (congestive) heart failure: Secondary | ICD-10-CM | POA: Diagnosis not present

## 2021-09-08 DIAGNOSIS — I482 Chronic atrial fibrillation, unspecified: Secondary | ICD-10-CM | POA: Diagnosis not present

## 2021-09-08 DIAGNOSIS — M5481 Occipital neuralgia: Secondary | ICD-10-CM | POA: Diagnosis not present

## 2021-09-08 DIAGNOSIS — M542 Cervicalgia: Secondary | ICD-10-CM | POA: Diagnosis not present

## 2021-09-23 DIAGNOSIS — I482 Chronic atrial fibrillation, unspecified: Secondary | ICD-10-CM | POA: Diagnosis not present

## 2021-09-23 DIAGNOSIS — I2510 Atherosclerotic heart disease of native coronary artery without angina pectoris: Secondary | ICD-10-CM | POA: Diagnosis not present

## 2021-09-30 ENCOUNTER — Other Ambulatory Visit: Payer: Medicare HMO | Admitting: Primary Care

## 2021-09-30 DIAGNOSIS — I5022 Chronic systolic (congestive) heart failure: Secondary | ICD-10-CM

## 2021-09-30 DIAGNOSIS — I10 Essential (primary) hypertension: Secondary | ICD-10-CM | POA: Diagnosis not present

## 2021-09-30 DIAGNOSIS — J9611 Chronic respiratory failure with hypoxia: Secondary | ICD-10-CM | POA: Diagnosis not present

## 2021-09-30 DIAGNOSIS — J431 Panlobular emphysema: Secondary | ICD-10-CM | POA: Diagnosis not present

## 2021-09-30 DIAGNOSIS — I2510 Atherosclerotic heart disease of native coronary artery without angina pectoris: Secondary | ICD-10-CM | POA: Diagnosis not present

## 2021-09-30 DIAGNOSIS — Z89512 Acquired absence of left leg below knee: Secondary | ICD-10-CM

## 2021-09-30 DIAGNOSIS — J449 Chronic obstructive pulmonary disease, unspecified: Secondary | ICD-10-CM

## 2021-09-30 DIAGNOSIS — M5000 Cervical disc disorder with myelopathy, unspecified cervical region: Secondary | ICD-10-CM

## 2021-09-30 DIAGNOSIS — F320 Major depressive disorder, single episode, mild: Secondary | ICD-10-CM | POA: Diagnosis not present

## 2021-09-30 DIAGNOSIS — D6851 Activated protein C resistance: Secondary | ICD-10-CM | POA: Diagnosis not present

## 2021-09-30 DIAGNOSIS — I482 Chronic atrial fibrillation, unspecified: Secondary | ICD-10-CM | POA: Diagnosis not present

## 2021-09-30 DIAGNOSIS — Z89611 Acquired absence of right leg above knee: Secondary | ICD-10-CM

## 2021-09-30 DIAGNOSIS — I70219 Atherosclerosis of native arteries of extremities with intermittent claudication, unspecified extremity: Secondary | ICD-10-CM | POA: Diagnosis not present

## 2021-09-30 DIAGNOSIS — F418 Other specified anxiety disorders: Secondary | ICD-10-CM

## 2021-09-30 DIAGNOSIS — Z515 Encounter for palliative care: Secondary | ICD-10-CM

## 2021-09-30 NOTE — Progress Notes (Signed)
? ? ?Manufacturing engineer ?Community Palliative Care Consult Note ?Telephone: [phone]  ?Fax: [phone]  ? ? ?Date of encounter: 09/30/21 ?11:51 AM ?PATIENT NAME: Tyler [name] ?[place] [address]   ?[phone] (home)  ?DOB: 02/06/1956 ?MRN: [id number] ?PRIMARY CARE PROVIDER:    ?Tyler Hire, MD,  ?[address] ?[place] [address] ?[phone] ? ?REFERRING PROVIDER:   ?Tyler Hire, MD ?[address] ?[place],  [place] [address] ?[phone] ? ?RESPONSIBLE PARTY:    ?Contact Information   ? ? Name Relation Home Work Mobile  ? Tyler [name]  [phone]  ? Tyler [name] Sister   [phone]  ? Tyler [name] Sister   [phone]  ? ?  ? ? ? ?I met face to face with patient and family in  home. Palliative Care was asked to follow this patient by consultation request of  Tyler Hire, MD to address advance care planning and complex medical decision making. This is a follow up visit. ? ?                                 ASSESSMENT AND PLAN / RECOMMENDATIONS:  ? ?Advance Care Planning/Goals of Care: Goals include to maximize quality of life and symptom management. Patient/health care surrogate gave his/her permission to discuss.Our advance care planning conversation included a discussion about:    ?Exploration of personal, cultural or spiritual beliefs that might influence medical decisions  ?Exploration of goals of care in the event of a sudden injury or illness  ?Identification of a healthcare agent - wife  ?Review  an  advance directive document. ?No changes to MOST form, states " try to save me but not  to live on machines" ?CODE STATUS: FULL  ? ?Symptom Management/Plan: ? ?Mobility: Uses prosthesis of L to transfer, able to use sliding board as well but is DOE. Endorses more difficult to transfer. Uses weights for upper body exercises. ? ?COPD: On prednisone 65m now. States it's helping. Discussed possible decrease due  to long term prednisone effects. States he was taken off daliresp but did not have intolerance . Will discuss with pulmonary. Also discussed Trelogy cost and use. He has copay for 150$/ month. Discussed high cost due to associated supportive services of RT. HE is complaint and voices improvement on NIV. Discussed Rx of air hunger , discussed morphine indication. This would be appropriate to change oxycodone to morphine if desired, rough equivalent dose to oxycodone 7.5 mg is 10 mg morphine. Also advocated fan for face (CN 5 stimulation) and Pursed lip breathing, PEEP, to improve SOB. ? ?Nutrition: eating well, taking supplements as needed. ? ?Follow up Palliative Care Visit: Palliative care will continue to follow for complex medical decision making, advance care planning, and clarification of goals. Return 16 weeks or prn. ? ?I spent 60 minutes providing this consultation. More than 50% of the time in this consultation was spent in counseling and care coordination. ? ?PPS: 40% ? ?HOSPICE ELIGIBILITY/DIAGNOSIS: TBD ? ?Chief Complaint: debility, SOB ? ?HISTORY OF PRESENT ILLNESS:  Tyler SCHUELLERis a 66y.o. year old male  with CHF, COPD Gold IV, Debility, Bil amputations, immobility, depression, chronic pain . Patient seen today to review palliative care needs to include medical decision making and advance care planning as appropriate.  ? ?History obtained from review of EMR, discussion with primary team, and interview with family, facility  staff/caregiver and/or Tyler [name].  ?I reviewed available labs, medications, imaging, studies and related documents from the EMR.  Records reviewed and summarized above.  ? ?ROS ? ?General: NAD ?EYES: denies vision changes ?ENMT: denies dysphagia ?Cardiovascular: denies chest pain, endorses  DOE ?Pulmonary: endorse occ cough, endorses  increased SOB ?Abdomen: endorses good appetite, endorses occ constipation,  endorses loose bowelslendorses incontinence of bowel ?GU: denies  dysuria, endorses incontinence of urine ?MSK:  endorses  increased weakness,  no falls reported ?Skin: denies rashes or wounds, has bruising on arms ?Neurological: endorses chronic pain, denies insomnia ?Psych: Endorses positive mood ? ?Physical Exam: ?Current and past weights: 158 lbs ?Constitutional: NAD ?General: frail appearing, thin ?EYES: anicteric sclera, lids intact, no discharge  ?ENMT: intact hearing, oral mucous membranes moist, dentition intact ?Pulmonary:no increased work of breathing, no cough, supplemental oxygen ?Abdomen: intake 100%, soft and non tender, no ascites ?MSK: + sarcopenia, moves all extremities, bil amputations, non ambulatory ?Skin: warm and dry, no rashes or wounds on visible skin ?Neuro:  + generalized weakness,  no cognitive impairment, non-anxious affect ? ? ?Thank you for the opportunity to participate in the care of Tyler [name].  The palliative care team will continue to follow. Please call our office at [phone] if we can be of additional assistance.  ? ?Tyler Coop, NP DNP, AGPCNP-BC ? ?COVID-19 PATIENT SCREENING TOOL ?Asked and negative response unless otherwise noted:  ? ?Have you had symptoms of covid, tested positive or been in contact with someone with symptoms/positive test in the past 5-10 days?  ? ?

## 2021-10-15 DIAGNOSIS — I482 Chronic atrial fibrillation, unspecified: Secondary | ICD-10-CM | POA: Diagnosis not present

## 2021-10-29 ENCOUNTER — Other Ambulatory Visit: Payer: Self-pay

## 2021-10-29 NOTE — Patient Instructions (Signed)
Patient Goals/Self-Care Activities: COPD Take all medications as prescribed Attend all scheduled provider appointments do breathing exercises every day follow rescue plan if symptoms flare-up Pace self with activity

## 2021-10-29 NOTE — Patient Outreach (Signed)
[facility]) Care Management  10/29/2021  Tyler [name] [birth year] [phone]   Telephone call to patient for COPD follow up. Patient doing good.  COPD management continues.  No concerns.   Care Plan : COPD (Adult)  Updates made by Jon Billings, RN since 10/29/2021 12:00 AM  Completed 10/29/2021   Problem: Symptom Exacerbation (COPD) Resolved 10/29/2021     Long-Range Goal: Symptom Exacerbation Prevented or Minimized as evidenced by no COPD exacerbations Completed 10/29/2021  Start Date: 03/04/2020  Expected End Date: 11/21/2021  This Visit's Progress: On track  Recent Progress: On track  Priority: High  Note:   Evidence-based guidance:   Promote physical activity or exercise to improve or maintain exercise capacity,  based on tolerance that may include walking, water exercise, cycling or limb  muscle strength training.  Promote use of energy conservation and activity pacing techniques.Monitor for signs of respiratory infection, including changes in sputum color, volume and thickness, as well as fever.  Encourage infection prevention strategies that may include prophylactic antibiotic therapy for patients with history of frequent exacerbations or antibiotic administration during exacerbation based on presentation, risk and benefit.  Notes:  Resolved duplicate goal    Task: Identify and Minimize Risk of COPD Exacerbation Completed 10/29/2021  Due Date: 11/21/2021  Priority: Routine  Responsible User: Jon Billings, RN  Note:   Care Management Activities:  Reiterated:   - healthy lifestyle promoted - rescue (action) plan reviewed    Notes: 08/06/20 patient has not had any exacerbation of COPD.  11/05/20 Patient doing pulmonary rehab.  He reports that is it tiring and not sure if he will be able to complete all 36 weeks.  Discussed home strengthening exercises.   02/04/21 Patient reports breathing about the same. Patient did not finish pulmonary rehab due not being able  to handle the activity.  Discussed COPD management.  No concerns.  07/31/21 Patient continues with COPD management.      Care Plan : RN CM Plan of Care  Updates made by Jon Billings, RN since 10/29/2021 12:00 AM     Problem: Chronic Disease Management and Care Coordination Needs COPD   Priority: High     Long-Range Goal: Development of Plan of Care for Management of COPD   Start Date: 10/29/2021  Expected End Date: 05/24/2022  Priority: High  Note:   Current Barriers:  Chronic Disease Management support and education needs related to COPD   RNCM Clinical Goal(s):  Patient will verbalize basic understanding of  COPD disease process and self health management plan as evidenced by no COPD exacerbation continue to work with RN Care Manager to address care management and care coordination needs related to  COPD as evidenced by adherence to CM Team Scheduled appointments through collaboration with RN Care manager, provider, and care team.   Interventions: Education and support related to COPD Inter-disciplinary care team collaboration (see longitudinal plan of care) Evaluation of current treatment plan related to  self management and patient's adherence to plan as established by provider   COPD Interventions:  (Status:  Goal on track:  Yes.) Long Term Goal Provided patient with basic written and verbal COPD education on self care/management/and exacerbation prevention Advised patient to track and manage COPD triggers Advised patient to self assesses COPD action plan zone and make appointment with provider if in the yellow zone for 48 hours without improvement  10/29/21 Patient doing well.  Advised to pace self with activity and notify physician for any changes.  No  concerns.  Patient Goals/Self-Care Activities: COPD Take all medications as prescribed Attend all scheduled provider appointments do breathing exercises every day follow rescue plan if symptoms flare-up Pace self with  activity  Follow Up Plan:  Telephone follow up appointment with care management team member scheduled for:  September The patient has been provided with contact information for the care management team and has been advised to call with any health related questions or concerns.      Plan: Follow-up: Patient agrees to Care Plan and Follow-up. Follow-up in September.  [name], RN, MSN [facility] Care Management Care Management Coordinator Direct Line [phone] Toll Free: [phone]  Fax: [phone]

## 2021-10-30 ENCOUNTER — Ambulatory Visit: Payer: Self-pay

## 2021-11-06 DIAGNOSIS — I482 Chronic atrial fibrillation, unspecified: Secondary | ICD-10-CM | POA: Diagnosis not present

## 2021-11-18 DIAGNOSIS — J449 Chronic obstructive pulmonary disease, unspecified: Secondary | ICD-10-CM | POA: Diagnosis not present

## 2021-12-04 DIAGNOSIS — I482 Chronic atrial fibrillation, unspecified: Secondary | ICD-10-CM | POA: Diagnosis not present

## 2021-12-04 DIAGNOSIS — Z79899 Other long term (current) drug therapy: Secondary | ICD-10-CM | POA: Diagnosis not present

## 2021-12-10 DIAGNOSIS — J449 Chronic obstructive pulmonary disease, unspecified: Secondary | ICD-10-CM | POA: Diagnosis not present

## 2021-12-17 ENCOUNTER — Other Ambulatory Visit: Payer: Self-pay

## 2021-12-17 NOTE — Patient Outreach (Signed)
[facility]) Care Management  12/17/2021  MCCRAE SPECIALE 1956/05/06 [id number]   Telephone call to patient for case closure.  Patient advised on case closure as he is meeting goals and doing well.  Patient agreeable.    RN CM closing case.   [name], RN, MSN [facility] Care Management Care Management Coordinator Direct Line [phone] Toll Free: [phone]  Fax: [phone]

## 2021-12-24 DIAGNOSIS — I10 Essential (primary) hypertension: Secondary | ICD-10-CM | POA: Diagnosis not present

## 2021-12-24 DIAGNOSIS — I482 Chronic atrial fibrillation, unspecified: Secondary | ICD-10-CM | POA: Diagnosis not present

## 2021-12-31 DIAGNOSIS — J9611 Chronic respiratory failure with hypoxia: Secondary | ICD-10-CM | POA: Diagnosis not present

## 2021-12-31 DIAGNOSIS — F320 Major depressive disorder, single episode, mild: Secondary | ICD-10-CM | POA: Diagnosis not present

## 2021-12-31 DIAGNOSIS — I70219 Atherosclerosis of native arteries of extremities with intermittent claudication, unspecified extremity: Secondary | ICD-10-CM | POA: Diagnosis not present

## 2021-12-31 DIAGNOSIS — J431 Panlobular emphysema: Secondary | ICD-10-CM | POA: Diagnosis not present

## 2021-12-31 DIAGNOSIS — I5022 Chronic systolic (congestive) heart failure: Secondary | ICD-10-CM | POA: Diagnosis not present

## 2021-12-31 DIAGNOSIS — D6851 Activated protein C resistance: Secondary | ICD-10-CM | POA: Diagnosis not present

## 2021-12-31 DIAGNOSIS — I110 Hypertensive heart disease with heart failure: Secondary | ICD-10-CM | POA: Diagnosis not present

## 2021-12-31 DIAGNOSIS — I482 Chronic atrial fibrillation, unspecified: Secondary | ICD-10-CM | POA: Diagnosis not present

## 2021-12-31 DIAGNOSIS — I2510 Atherosclerotic heart disease of native coronary artery without angina pectoris: Secondary | ICD-10-CM | POA: Diagnosis not present

## 2022-01-06 DIAGNOSIS — I5022 Chronic systolic (congestive) heart failure: Secondary | ICD-10-CM | POA: Diagnosis not present

## 2022-01-22 DIAGNOSIS — I482 Chronic atrial fibrillation, unspecified: Secondary | ICD-10-CM | POA: Diagnosis not present

## 2022-01-22 DIAGNOSIS — J449 Chronic obstructive pulmonary disease, unspecified: Secondary | ICD-10-CM | POA: Diagnosis not present

## 2022-01-22 DIAGNOSIS — J440 Chronic obstructive pulmonary disease with acute lower respiratory infection: Secondary | ICD-10-CM | POA: Diagnosis not present

## 2022-01-27 ENCOUNTER — Other Ambulatory Visit: Payer: Medicare HMO | Admitting: Primary Care

## 2022-02-02 ENCOUNTER — Other Ambulatory Visit: Payer: Medicare HMO | Admitting: Primary Care

## 2022-02-02 DIAGNOSIS — R040 Epistaxis: Secondary | ICD-10-CM | POA: Diagnosis not present

## 2022-02-02 DIAGNOSIS — Z89611 Acquired absence of right leg above knee: Secondary | ICD-10-CM | POA: Diagnosis not present

## 2022-02-03 ENCOUNTER — Ambulatory Visit: Payer: Self-pay

## 2022-02-05 ENCOUNTER — Telehealth: Payer: Medicare HMO | Admitting: Primary Care

## 2022-02-05 DIAGNOSIS — Z515 Encounter for palliative care: Secondary | ICD-10-CM | POA: Diagnosis not present

## 2022-02-05 DIAGNOSIS — I5022 Chronic systolic (congestive) heart failure: Secondary | ICD-10-CM

## 2022-02-05 DIAGNOSIS — J449 Chronic obstructive pulmonary disease, unspecified: Secondary | ICD-10-CM | POA: Diagnosis not present

## 2022-02-05 NOTE — Progress Notes (Signed)
[facility] Palliative Care Consult Note Telephone: [phone]  Fax: [phone]    Date of encounter: 02/05/22 1:41 PM PATIENT NAME: D'ARCY ABRAHA [place] [address]   [phone] (home)  DOB: [birth year] MRN: [id number] PRIMARY CARE PROVIDER:    [name], MD,  [place] [address] [phone]  REFERRING PROVIDER:   [name], MD [place],  [place] [address] [phone]  RESPONSIBLE PARTY:    Contact Information     Name Relation Home Work Mobile   Ansel, Ferrall [phone]  [phone]   Thomasenia Bottoms Sister   [phone]   Gerringer, Jenny Reichmann Sister   [phone]       Due to the COVID-19 crisis, this visit was done via telemedicine from my office and it was initiated and consent by this patient and or family.  I connected with  [name] OR PROXY on 02/05/22 by a audio enabled telemedicine application and verified that I am speaking with the correct person using two identifiers. Video had been scheduled but patient was not able to work it.   I discussed the limitations of evaluation and management by telemedicine. The patient expressed understanding and agreed to proceed.  Palliative Care was asked to follow this patient by consultation request of  [name], MD to address advance care planning and complex medical decision making. This is a follow up visit.                                   ASSESSMENT AND PLAN / RECOMMENDATIONS:   Advance Care Planning/Goals of Care: Goals include to maximize quality of life and symptom management. Patient/health care surrogate gave his/her permission to discuss.Our advance care planning conversation included a discussion about:     Exploration of personal, cultural or spiritual beliefs that might influence medical decisions  Exploration of goals of care in the event of a sudden injury or  illness   Symptom Management/Plan:  Reports nose bleed on Monday, went to MD, bled most of the day.  Had INR done, and it was WNL. Has not had an issue since then. Discussed humidity to oxygen system. Had reaction with DOAC.    Endorses good  appetite.  Weight is 163 lbs.  Endorses stamina is poor at baseline, with self propelling of w/c being his mobility.  Discussed possible transplant, which he decided not to do it.  He has many co morbidities and he decided it's not worth the risk.   Discussed his goals for care.  Continue to be QoL driven.  Follow up Palliative Care Visit: Palliative care will continue to follow for complex medical decision making, advance care planning, and clarification of goals. Return 6-8 weeks or prn.  I spent 20 minutes providing this consultation. More than 50% of the time in this consultation was spent in counseling and care coordination.   PPS: 40%  HOSPICE ELIGIBILITY/DIAGNOSIS: TBD  Chief Complaint: debility  HISTORY OF PRESENT ILLNESS:  HANSEL DEVAN is a 66 y.o. year old male  with bil amputation of legs, COPD, CAD, debility . Patient seen today to review palliative care needs to include medical decision making and advance care planning as appropriate.   History obtained from review of EMR, discussion with primary team, and interview with family, facility staff/caregiver and/or Mr. Pierre.  I reviewed available  labs, medications, imaging, studies and related documents from the EMR.  Records reviewed and summarized above.   ROS   As above  Physical Exam: Current and past weights: 163 lbs deferred   Thank you for the opportunity to participate in the care of Mr. Rosenbloom.  The palliative care team will continue to follow. Please call our office at [phone] if we can be of additional assistance.   Jason Coop, NP DNP, AGPCNP-BC

## 2022-02-19 DIAGNOSIS — I482 Chronic atrial fibrillation, unspecified: Secondary | ICD-10-CM | POA: Diagnosis not present

## 2022-03-10 ENCOUNTER — Inpatient Hospital Stay
Admission: EM | Admit: 2022-03-10 | Discharge: 2022-03-13 | DRG: 190 | Disposition: A | Discharge status: Home / Self Care | Payer: Medicare HMO | Attending: Internal Medicine | Admitting: Internal Medicine

## 2022-03-10 ENCOUNTER — Emergency Department: Payer: Medicare HMO

## 2022-03-10 ENCOUNTER — Encounter: Payer: Self-pay | Admitting: Internal Medicine

## 2022-03-10 DIAGNOSIS — R0902 Hypoxemia: Secondary | ICD-10-CM | POA: Diagnosis not present

## 2022-03-10 DIAGNOSIS — Z89512 Acquired absence of left leg below knee: Secondary | ICD-10-CM

## 2022-03-10 DIAGNOSIS — I2510 Atherosclerotic heart disease of native coronary artery without angina pectoris: Secondary | ICD-10-CM | POA: Diagnosis present

## 2022-03-10 DIAGNOSIS — Z7952 Long term (current) use of systemic steroids: Secondary | ICD-10-CM

## 2022-03-10 DIAGNOSIS — J9621 Acute and chronic respiratory failure with hypoxia: Secondary | ICD-10-CM | POA: Diagnosis not present

## 2022-03-10 DIAGNOSIS — I739 Peripheral vascular disease, unspecified: Secondary | ICD-10-CM | POA: Diagnosis present

## 2022-03-10 DIAGNOSIS — J441 Chronic obstructive pulmonary disease with (acute) exacerbation: Secondary | ICD-10-CM | POA: Diagnosis not present

## 2022-03-10 DIAGNOSIS — G4733 Obstructive sleep apnea (adult) (pediatric): Secondary | ICD-10-CM | POA: Diagnosis present

## 2022-03-10 DIAGNOSIS — I252 Old myocardial infarction: Secondary | ICD-10-CM

## 2022-03-10 DIAGNOSIS — K219 Gastro-esophageal reflux disease without esophagitis: Secondary | ICD-10-CM | POA: Diagnosis present

## 2022-03-10 DIAGNOSIS — R0689 Other abnormalities of breathing: Secondary | ICD-10-CM | POA: Diagnosis not present

## 2022-03-10 DIAGNOSIS — Z89511 Acquired absence of right leg below knee: Secondary | ICD-10-CM

## 2022-03-10 DIAGNOSIS — F419 Anxiety disorder, unspecified: Secondary | ICD-10-CM | POA: Diagnosis present

## 2022-03-10 DIAGNOSIS — N400 Enlarged prostate without lower urinary tract symptoms: Secondary | ICD-10-CM | POA: Diagnosis present

## 2022-03-10 DIAGNOSIS — J9811 Atelectasis: Secondary | ICD-10-CM | POA: Diagnosis not present

## 2022-03-10 DIAGNOSIS — J449 Chronic obstructive pulmonary disease, unspecified: Secondary | ICD-10-CM | POA: Diagnosis present

## 2022-03-10 DIAGNOSIS — G4730 Sleep apnea, unspecified: Secondary | ICD-10-CM | POA: Diagnosis present

## 2022-03-10 DIAGNOSIS — I5022 Chronic systolic (congestive) heart failure: Secondary | ICD-10-CM | POA: Diagnosis present

## 2022-03-10 DIAGNOSIS — J439 Emphysema, unspecified: Secondary | ICD-10-CM | POA: Diagnosis present

## 2022-03-10 DIAGNOSIS — R000 Tachycardia, unspecified: Secondary | ICD-10-CM | POA: Diagnosis not present

## 2022-03-10 DIAGNOSIS — Z20822 Contact with and (suspected) exposure to covid-19: Secondary | ICD-10-CM | POA: Diagnosis present

## 2022-03-10 DIAGNOSIS — M4802 Spinal stenosis, cervical region: Secondary | ICD-10-CM | POA: Diagnosis present

## 2022-03-10 DIAGNOSIS — Z89611 Acquired absence of right leg above knee: Secondary | ICD-10-CM

## 2022-03-10 DIAGNOSIS — Z7901 Long term (current) use of anticoagulants: Secondary | ICD-10-CM | POA: Diagnosis not present

## 2022-03-10 DIAGNOSIS — Z9981 Dependence on supplemental oxygen: Secondary | ICD-10-CM | POA: Diagnosis not present

## 2022-03-10 DIAGNOSIS — R0602 Shortness of breath: Secondary | ICD-10-CM | POA: Insufficient documentation

## 2022-03-10 DIAGNOSIS — Z515 Encounter for palliative care: Secondary | ICD-10-CM

## 2022-03-10 DIAGNOSIS — I10 Essential (primary) hypertension: Secondary | ICD-10-CM | POA: Diagnosis present

## 2022-03-10 DIAGNOSIS — I110 Hypertensive heart disease with heart failure: Secondary | ICD-10-CM | POA: Diagnosis present

## 2022-03-10 DIAGNOSIS — Z888 Allergy status to other drugs, medicaments and biological substances status: Secondary | ICD-10-CM

## 2022-03-10 DIAGNOSIS — Z7951 Long term (current) use of inhaled steroids: Secondary | ICD-10-CM

## 2022-03-10 DIAGNOSIS — F32A Depression, unspecified: Secondary | ICD-10-CM | POA: Diagnosis not present

## 2022-03-10 DIAGNOSIS — Z95810 Presence of automatic (implantable) cardiac defibrillator: Secondary | ICD-10-CM | POA: Diagnosis not present

## 2022-03-10 DIAGNOSIS — Z79899 Other long term (current) drug therapy: Secondary | ICD-10-CM

## 2022-03-10 DIAGNOSIS — I480 Paroxysmal atrial fibrillation: Secondary | ICD-10-CM | POA: Diagnosis not present

## 2022-03-10 DIAGNOSIS — Z7984 Long term (current) use of oral hypoglycemic drugs: Secondary | ICD-10-CM

## 2022-03-10 DIAGNOSIS — J9602 Acute respiratory failure with hypercapnia: Secondary | ICD-10-CM

## 2022-03-10 DIAGNOSIS — R062 Wheezing: Secondary | ICD-10-CM | POA: Diagnosis not present

## 2022-03-10 DIAGNOSIS — I4510 Unspecified right bundle-branch block: Secondary | ICD-10-CM | POA: Diagnosis present

## 2022-03-10 DIAGNOSIS — R0600 Dyspnea, unspecified: Secondary | ICD-10-CM | POA: Diagnosis not present

## 2022-03-10 DIAGNOSIS — I959 Hypotension, unspecified: Secondary | ICD-10-CM | POA: Diagnosis not present

## 2022-03-10 LAB — BASIC METABOLIC PANEL
Anion gap: 9 (ref 5–15)
BUN: 14 mg/dL (ref 8–23)
CO2: 28 mmol/L (ref 22–32)
Calcium: 8.9 mg/dL (ref 8.9–10.3)
Chloride: 97 mmol/L — ABNORMAL LOW (ref 98–111)
Creatinine, Ser: 1.08 mg/dL (ref 0.61–1.24)
GFR, Estimated: >60 mL/min (ref >60)
Glucose, Bld: 121 mg/dL — ABNORMAL HIGH (ref 70–99)
Potassium: 3.8 mmol/L (ref 3.5–5.1)
Sodium: 134 mmol/L — ABNORMAL LOW (ref 135–145)

## 2022-03-10 LAB — CBC WITH DIFFERENTIAL/PLATELET
Abs Immature Granulocytes: 0.12 10*3/uL — ABNORMAL HIGH (ref 0.00–0.07)
Basophils Absolute: 0.1 10*3/uL (ref 0.0–0.1)
Basophils Relative: 0 %
Eosinophils Absolute: 0 10*3/uL (ref 0.0–0.5)
Eosinophils Relative: 0 %
HCT: 44.2 % (ref 39.0–52.0)
Hemoglobin: 14.1 g/dL (ref 13.0–17.0)
Immature Granulocytes: 1 %
Lymphocytes Relative: 4 %
Lymphs Abs: 0.7 10*3/uL (ref 0.7–4.0)
MCH: 29.9 pg (ref 26.0–34.0)
MCHC: 31.9 g/dL (ref 30.0–36.0)
MCV: 93.6 fL (ref 80.0–100.0)
Monocytes Absolute: 1.4 10*3/uL — ABNORMAL HIGH (ref 0.1–1.0)
Monocytes Relative: 7 %
Neutro Abs: 17.8 10*3/uL — ABNORMAL HIGH (ref 1.7–7.7)
Neutrophils Relative %: 88 %
Platelets: 224 10*3/uL (ref 150–400)
RBC: 4.72 MIL/uL (ref 4.22–5.81)
RDW: 13.5 % (ref 11.5–15.5)
WBC: 20.1 10*3/uL — ABNORMAL HIGH (ref 4.0–10.5)
nRBC: 0 % (ref 0.0–0.2)

## 2022-03-10 LAB — BLOOD GAS, VENOUS
Acid-Base Excess: 0 mmol/L (ref 0.0–2.0)
Acid-base deficit: 3.4 mmol/L — ABNORMAL HIGH (ref 0.0–2.0)
Bicarbonate: 25.5 mmol/L (ref 20.0–28.0)
Bicarbonate: 26 mmol/L (ref 20.0–28.0)
O2 Saturation: 89.8 %
O2 Saturation: 94.7 %
Patient temperature: 36
Patient temperature: 36
pCO2, Ven: 58 mmHg (ref 44–60)
pCO2, Ven: 44 mmHg (ref 44–60)
pH, Ven: 7.24 — ABNORMAL LOW (ref 7.25–7.43)
pH, Ven: 7.37 (ref 7.25–7.43)
pO2, Ven: 58 mmHg — ABNORMAL HIGH (ref 32–45)
pO2, Ven: 67 mmHg — ABNORMAL HIGH (ref 32–45)

## 2022-03-10 LAB — APTT: aPTT: 35 seconds (ref 24–36)

## 2022-03-10 LAB — SARS CORONAVIRUS 2 BY RT PCR: SARS Coronavirus 2 by RT PCR: NEGATIVE

## 2022-03-10 LAB — TROPONIN I (HIGH SENSITIVITY)
Troponin I (High Sensitivity): 21 ng/L — ABNORMAL HIGH (ref <18)
Troponin I (High Sensitivity): 47 ng/L — ABNORMAL HIGH (ref <18)

## 2022-03-10 LAB — PROTIME-INR
INR: 2.2 — ABNORMAL HIGH (ref 0.8–1.2)
Prothrombin Time: 23.8 seconds — ABNORMAL HIGH (ref 11.4–15.2)

## 2022-03-10 LAB — LACTIC ACID, PLASMA
Lactic Acid, Venous: 2.3 mmol/L (ref 0.5–1.9)
Lactic Acid, Venous: 2.2 mmol/L (ref 0.5–1.9)

## 2022-03-10 LAB — BRAIN NATRIURETIC PEPTIDE: B Natriuretic Peptide: 97.6 pg/mL (ref 0.0–100.0)

## 2022-03-10 MED ORDER — METHYLPREDNISOLONE SODIUM SUCC 125 MG IJ SOLR
60.0000 mg | Freq: Two times a day (BID) | INTRAMUSCULAR | Status: DC
  Administered 2022-03-11: 60 mg via INTRAVENOUS
  Filled 2022-03-10: qty 2

## 2022-03-10 MED ORDER — HYDRALAZINE HCL 20 MG/ML IJ SOLN: 5.0000 mg | INTRAMUSCULAR | Status: DC | PRN

## 2022-03-10 MED ORDER — GABAPENTIN 100 MG PO CAPS
200.0000 mg | ORAL_CAPSULE | Freq: Three times a day (TID) | ORAL | Status: DC
  Administered 2022-03-11 – 2022-03-13 (×7): 200 mg via ORAL
  Filled 2022-03-10 (×7): qty 2

## 2022-03-10 MED ORDER — IPRATROPIUM-ALBUTEROL 0.5-2.5 (3) MG/3ML IN SOLN
3.0000 mL | Freq: Once | RESPIRATORY_TRACT | Status: AC
  Administered 2022-03-10: 3 mL via RESPIRATORY_TRACT
  Filled 2022-03-10: qty 3

## 2022-03-10 MED ORDER — PREDNISONE 20 MG PO TABS: 40.0000 mg | ORAL_TABLET | Freq: Every day | ORAL | Status: DC

## 2022-03-10 MED ORDER — IOHEXOL 350 MG/ML SOLN
75.0000 mL | Freq: Once | INTRAVENOUS | Status: AC | PRN
  Administered 2022-03-10: 75 mL via INTRAVENOUS

## 2022-03-10 MED ORDER — IPRATROPIUM-ALBUTEROL 0.5-2.5 (3) MG/3ML IN SOLN
3.0000 mL | Freq: Four times a day (QID) | RESPIRATORY_TRACT | Status: DC
  Administered 2022-03-11 (×2): 3 mL via RESPIRATORY_TRACT
  Filled 2022-03-10 (×2): qty 3

## 2022-03-10 MED ORDER — SODIUM CHLORIDE 0.9 % IV SOLN
1.0000 g | Freq: Once | INTRAVENOUS | Status: AC
  Administered 2022-03-10: 1 g via INTRAVENOUS
  Filled 2022-03-10: qty 10

## 2022-03-10 MED ORDER — LACTATED RINGERS IV SOLN: INTRAVENOUS | Status: DC

## 2022-03-10 MED ORDER — NICOTINE 14 MG/24HR TD PT24
14.0000 mg | MEDICATED_PATCH | Freq: Every day | TRANSDERMAL | Status: DC
  Filled 2022-03-10 (×3): qty 1

## 2022-03-10 MED ORDER — LORAZEPAM 2 MG/ML IJ SOLN
1.0000 mg | Freq: Once | INTRAMUSCULAR | Status: AC
  Administered 2022-03-10: 1 mg via INTRAVENOUS
  Filled 2022-03-10: qty 1

## 2022-03-10 MED ORDER — GUAIFENESIN ER 600 MG PO TB12: 600.0000 mg | ORAL_TABLET | Freq: Two times a day (BID) | ORAL | Status: DC | PRN

## 2022-03-10 MED ORDER — PREDNISONE 20 MG PO TABS
60.0000 mg | ORAL_TABLET | Freq: Once | ORAL | Status: AC
  Administered 2022-03-10: 60 mg via ORAL
  Filled 2022-03-10: qty 3

## 2022-03-10 MED ORDER — SODIUM CHLORIDE 0.9 % IV SOLN
1.0000 g | INTRAVENOUS | Status: DC
  Administered 2022-03-11 – 2022-03-12 (×2): 1 g via INTRAVENOUS
  Filled 2022-03-10 (×3): qty 10

## 2022-03-10 MED ORDER — LACTATED RINGERS IV BOLUS (SEPSIS)
500.0000 mL | Freq: Once | INTRAVENOUS | Status: AC
  Administered 2022-03-10: 500 mL via INTRAVENOUS

## 2022-03-10 MED ORDER — ACETAMINOPHEN 325 MG PO TABS: 650.0000 mg | ORAL_TABLET | Freq: Four times a day (QID) | ORAL | Status: DC | PRN

## 2022-03-10 MED ORDER — IPRATROPIUM BROMIDE 0.03 % NA SOLN
2.0000 | Freq: Two times a day (BID) | NASAL | Status: DC
  Filled 2022-03-10: qty 30

## 2022-03-10 MED ORDER — ACETAMINOPHEN 650 MG RE SUPP: 650.0000 mg | Freq: Four times a day (QID) | RECTAL | Status: DC | PRN

## 2022-03-10 MED ORDER — METOPROLOL SUCCINATE ER 25 MG PO TB24
25.0000 mg | ORAL_TABLET | Freq: Every day | ORAL | Status: DC
  Administered 2022-03-11 – 2022-03-13 (×3): 25 mg via ORAL
  Filled 2022-03-10 (×3): qty 1

## 2022-03-10 MED ORDER — CITALOPRAM HYDROBROMIDE 20 MG PO TABS
20.0000 mg | ORAL_TABLET | Freq: Every day | ORAL | Status: DC
  Administered 2022-03-11 – 2022-03-13 (×3): 20 mg via ORAL
  Filled 2022-03-10 (×3): qty 1

## 2022-03-10 MED ORDER — DOXYCYCLINE HYCLATE 100 MG PO TABS
100.0000 mg | ORAL_TABLET | Freq: Once | ORAL | Status: AC
  Administered 2022-03-10: 100 mg via ORAL
  Filled 2022-03-10: qty 1

## 2022-03-10 MED ORDER — SODIUM CHLORIDE 0.9% FLUSH
3.0000 mL | Freq: Two times a day (BID) | INTRAVENOUS | Status: DC
  Administered 2022-03-11 – 2022-03-13 (×3): 3 mL via INTRAVENOUS

## 2022-03-10 MED ORDER — SACUBITRIL-VALSARTAN 24-26 MG PO TABS
1.0000 | ORAL_TABLET | Freq: Two times a day (BID) | ORAL | Status: DC
  Administered 2022-03-11 – 2022-03-13 (×5): 1 via ORAL
  Filled 2022-03-10 (×5): qty 1

## 2022-03-10 MED ORDER — BUDESONIDE 0.5 MG/2ML IN SUSP
0.5000 mg | Freq: Two times a day (BID) | RESPIRATORY_TRACT | Status: DC
  Administered 2022-03-11 – 2022-03-13 (×6): 0.5 mg via RESPIRATORY_TRACT
  Filled 2022-03-10 (×6): qty 2

## 2022-03-10 MED ORDER — IPRATROPIUM-ALBUTEROL 0.5-2.5 (3) MG/3ML IN SOLN
9.0000 mL | Freq: Once | RESPIRATORY_TRACT | Status: AC
  Administered 2022-03-10: 9 mL via RESPIRATORY_TRACT
  Filled 2022-03-10: qty 9

## 2022-03-10 MED ORDER — ALBUTEROL SULFATE (2.5 MG/3ML) 0.083% IN NEBU: 2.5000 mg | INHALATION_SOLUTION | RESPIRATORY_TRACT | Status: DC | PRN

## 2022-03-10 NOTE — Progress Notes (Signed)
Pt transported to CT on Bipap and returned to ED 12 without incident.

## 2022-03-10 NOTE — Hospital Course (Signed)
10/2019 2 d Echo: IMPRESSIONS     1. Left ventricular ejection fraction, by estimation, is 55 to 60%. Left  ventricular ejection fraction by PLAX is 62 %. The left ventricle has  normal function. The left ventricle demonstrates regional wall motion  abnormalities (see scoring  diagram/findings for description). Left ventricular diastolic parameters  were normal.   2. Right ventricular systolic function is normal. The right ventricular  size is normal.   3. Left atrial size was moderately dilated.   4. The mitral valve was not well visualized. Mild to moderate mitral  valve regurgitation.   5. The aortic valve was not well visualized. Aortic valve regurgitation  is trivial.  --------------------------------------------------------------------------------------------------------------------------

## 2022-03-10 NOTE — ED Provider Notes (Addendum)
[facility] Provider Note    Event Date/Time   First MD Initiated Contact with Patient 03/10/22 1507     (approximate)   History   Shortness of Breath (C/o having difficulty breathing since yesterday, today it got worse, normally on 2L of O2/)   HPI  Tyler [name] is a 66 y.o. 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  Consistent with COPD exacerbations in the past.  He has no edema.  He has baseline orthopnea unchanged.  Exertional dyspnea.  Transient relief with nebulizer treatments at home.  History was obtained via the patient and his family members at bedside      Physical Exam   Triage Vital Signs: ED Triage Vitals  Enc Vitals Group     BP 03/10/22 1447 (!) 155/137     Pulse Rate 03/10/22 1447 78     Resp 03/10/22 1447 (!) 23     Temp 03/10/22 1447 99 F (37.2 C)     Temp Source 03/10/22 1447 Oral     SpO2 03/10/22 1447 99 %     Weight --      Height --      Head Circumference --      Peak Flow --      Pain Score 03/10/22 1445 0     Pain Loc --      Pain Edu? --      Excl. in [place]? --     Most recent vital signs: Vitals:   03/10/22 1800 03/10/22 1840  BP: 93/67 115/68  Pulse: 70 79  Resp:  20  Temp:    SpO2:  97%    General: Awake, no distress.  CV:  Good peripheral perfusion.  euvolemic. Resp:  Normal effort.  Bilateral wheezing, no rales Abd:  No distention.  Other:  No peripheral edema   ED Results / Procedures / Treatments   Labs (all labs ordered are listed, but only abnormal results are displayed) Labs Reviewed  CBC WITH DIFFERENTIAL/PLATELET - Abnormal; Notable for the following components:      Result Value   WBC 20.1 (*)    Neutro Abs 17.8  (*)    Monocytes Absolute 1.4 (*)    Abs Immature Granulocytes 0.12 (*)    All other components within normal limits  BASIC METABOLIC PANEL - Abnormal; Notable for the following components:   Sodium 134 (*)    Chloride 97 (*)    Glucose, Bld 121 (*)    All other components within normal limits  LACTIC ACID, PLASMA - Abnormal; Notable for the following components:   Lactic Acid, Venous 2.3 (*)    All other components within normal limits  LACTIC ACID, PLASMA - Abnormal; Notable for the following components:   Lactic Acid, Venous 2.2 (*)    All other components within normal limits  PROTIME-INR - Abnormal; Notable for the following components:   Prothrombin Time 23.8 (*)    INR 2.2 (*)    All other components within normal limits  BLOOD GAS, VENOUS - Abnormal; Notable for the following components:   pH, Ven 7.24 (*)    pO2, Ven 58 (*)    Acid-base deficit 3.4 (*)    All other components within normal  limits  BLOOD GAS, VENOUS - Abnormal; Notable for the following components:   pO2, Ven 67 (*)    All other components within normal limits  TROPONIN I (HIGH SENSITIVITY) - Abnormal; Notable for the following components:   Troponin I (High Sensitivity) 21 (*)    All other components within normal limits  TROPONIN I (HIGH SENSITIVITY) - Abnormal; Notable for the following components:   Troponin I (High Sensitivity) 47 (*)    All other components within normal limits  SARS CORONAVIRUS 2 BY RT PCR  RESP PANEL BY RT-PCR (FLU A&B, COVID) ARPGX2  CULTURE, BLOOD (ROUTINE X 2)  CULTURE, BLOOD (ROUTINE X 2)  URINE CULTURE  BRAIN NATRIURETIC PEPTIDE  APTT  URINALYSIS, COMPLETE (UACMP) WITH MICROSCOPIC  COMPREHENSIVE METABOLIC PANEL  TROPONIN I (HIGH SENSITIVITY)     I reviewed labs and they are notable for lactic acid 2.3.  EKG  ED ECG REPORT I, Lucillie Garfinkel, the attending physician, personally viewed and interpreted this ECG.   Date: 03/10/2022  EKG Time: 1445  Rate: 77  Rhythm:  RBBB  Intervals:no stemi    RADIOLOGY I independently reviewed and interpreted cxr and see no opacities   PROCEDURES:  Critical Care performed: Yes, see critical care procedure note(s)  .Critical Care  Performed by: Lucillie Garfinkel, MD Authorized by: Lucillie Garfinkel, MD   Critical care provider statement:    Critical care time (minutes):  30   Critical care was necessary to treat or prevent imminent or life-threatening deterioration of the following conditions:  Respiratory failure   Critical care was time spent personally by me on the following activities:  Development of treatment plan with patient or surrogate, discussions with consultants, evaluation of patient's response to treatment, examination of patient, ordering and review of laboratory studies, ordering and review of radiographic studies, ordering and performing treatments and interventions, pulse oximetry, re-evaluation of patient's condition and review of old [place] ED: Medications  ipratropium-albuterol (DUONEB) 0.5-2.5 (3) MG/3ML nebulizer solution 3 mL (has no administration in time range)  ipratropium-albuterol (DUONEB) 0.5-2.5 (3) MG/3ML nebulizer solution 9 mL (9 mLs Nebulization Given 03/10/22 1651)  predniSONE (DELTASONE) tablet 60 mg (60 mg Oral Given 03/10/22 1651)  lactated ringers bolus 500 mL (500 mLs Intravenous New Bag/Given 03/10/22 1656)  cefTRIAXone (ROCEPHIN) 1 g in sodium chloride 0.9 % 100 mL IVPB (0 g Intravenous Stopped 03/10/22 1730)  doxycycline (VIBRA-TABS) tablet 100 mg (100 mg Oral Given 03/10/22 1651)    Consultants:  I spoke with hospitalist regarding care plan for this patient.   IMPRESSION / MDM / ASSESSMENT AND PLAN / ED COURSE  I reviewed the triage vital signs and the nursing notes.                              Differential diagnosis includes, but is not limited to, COPD exacerbation, respiratory infection, sepsis, less likely PE given on Coumadin, ACS less  likely without chest pain, less likely CHF with no signs of fluid overload.   The patient is on the cardiac monitor to evaluate for evidence of arrhythmia and/or significant heart rate changes.  MDM: Treated COPD exacerbation with CAP coverage, DuoNebs and prednisone.  Lactic acid elevated, blood pressure soft so was given gentle fluid hydration 500 cc normal saline with history of CHF and currently undifferentiated CHF versus COPD however the patient became acutely dyspneic and increased work of breathing about 300 cc of  fluids into treatment, so stopped, put on BiPAP, VBG at this time shows hypercarbic.  Much improved on BiPAP at this time.  Chest x-ray shows no signs of pulmonary edema and the patient does not appear fluid overloaded.  His blood pressure improved to 115/68.  I will hold further fluids at this time given more likely COPD exacerbation and earlier decompensation with fluid administration.  I consulted hospitalist for admission, they assessed the patient and ordered a CT angiogram of the chest to check for PE though I think this is less likely given he is therapeutic with his Coumadin.  They requested that I consult the ICU for admission.  I reassessed the patient at this time and he is feeling improved on BiPAP.  His VBG is much improved as well 7.37 from 7.24 and PCO2 improved from 58->44.   Patient's presentation is most consistent with acute presentation with potential threat to life or bodily function.       FINAL CLINICAL IMPRESSION(S) / ED DIAGNOSES   Final diagnoses:  None     Rx / DC Orders   ED Discharge Orders     None        Note:  This document was prepared using Dragon voice recognition software and may include unintentional dictation errors.    Lucillie Garfinkel, MD 03/10/22 0149    Lucillie Garfinkel, MD 03/10/22 [phone]

## 2022-03-10 NOTE — Progress Notes (Signed)
Assessed patient bedside at the request of Dr. Jacelyn Grip & Dr. Posey Pronto to determine if the patient was appropriate for ICU admission. Upon bedside conversation with the patient and family, he reports his breathing has improved on BIPAP support at 40% FiO2. He is able to discuss his recent symptoms without dyspnea or fatigue, speaking in complete sentences. VSS on monitor and ABG corrected. Patient does not meet ICU criteria at this time. - Recommendations for respiratory infectious work up including 20 pathogen viral panel. - continue BIPAP support overnight - COPD exacerbation treatment with nebulizer, steroids, empiric antibiotics for now with Azithromycin & Rocephin (using PCT trend as guide)  Discussed with Dr. Posey Pronto, she is in agreement to accept the patient for admission and will consult pulmonology as needed.   Domingo Pulse [place], AGACNP-BC Acute Care Nurse Practitioner [facility]   [phone] / [phone] Please see Amion for pager details.

## 2022-03-10 NOTE — H&P (Signed)
History and Physical    Chief Complaint: Respiratory distress.   HISTORY OF PRESENT ILLNESS: Tyler [name] is an 66 y.o. male  seen on bipap and mild to moderate distress and gives brief HPI with wife at bedside.  Pt states his symptoms started over the past 2 days with increasing cough clear sputum and shortness of breath.  Patient does not report any orthopnea Any leg swelling any dyspnea on exertion.  Patient denies any chest pain or palpitations.  On initial assessment patient was tachypneic and respiratory distress on BiPAP Hypotensive after evaluation of which I requested PCCM to evaluate patient for possible ICU admission.  Patient stabilized after treatment with BiPAP DuoNebs and Was admitted to stepdown for COPD exacerbation.  Pt has PMH as below: Past Medical History:  Diagnosis Date   AICD (automatic cardioverter/defibrillator) present    Anxiety    Arthritis    Atherosclerosis of artery of extremity with ulceration ([place]) 09/2019   left foot s/p toe amp requiring debridement and futher toe amputations.   Atrial fibrillation (HCC)    Cervical spinal stenosis    with neuropathy   CHF (congestive heart failure) (HCC)    Constipation    COPD (chronic obstructive pulmonary disease) (HCC)    COPD with acute exacerbation ([place]) 10/13/2016   Coronary artery disease    Depression    Dyspnea    Dysrhythmia    atrial fibrillation   Emphysema of lung (HCC)    Factor 5 Leiden mutation, heterozygous ([place])    on coumadin   Factor V Leiden mutation ([place])    GERD (gastroesophageal reflux disease)    Hypertension    Lung nodule seen on imaging study    being followed by dr. [name]. just watching it for last few years, without change   Mitral valve insufficiency    Moderate tricuspid insufficiency    Myocardial infarction [facility]) 2004   stent placed, pacemaker implanted 2005   Osteomyelitis ([place]) 10/2019   left foot   Oxygen dependent    requires 2L nasal prong oxygen 24  hours a day   Peripheral vascular disease ([place])    Presence of permanent cardiac pacemaker [phone]   PVD (peripheral vascular disease) ([place])    Sleep apnea    waiting to have sleep study. Used bipap while hospitalized and said it was great for him.   Review of Systems  Constitutional:  Positive for fatigue.  Respiratory:  Positive for shortness of breath.   All other systems reviewed and are negative.   Allergies  Allergen Reactions   Apixaban Rash   Rivaroxaban Rash   Past Surgical History:  Procedure Laterality Date   ABOVE KNEE LEG AMPUTATION Right    after below the knee amputation    AMPUTATION Left 11/22/2019   Procedure: AMPUTATION BELOW KNEE;  Surgeon: Algernon Huxley, MD;  Location: [facility] ORS;  Service: Vascular;  Laterality: Left;   AMPUTATION TOE Left 08/04/2019   Procedure: AMPUTATION TOE MPJ LEFT;  Surgeon: Samara Deist, DPM;  Location: [facility] OR[facility];  Service: Podiatry;  Laterality: Left;   AMPUTATION TOE Left 10/06/2019   Procedure: AMPUTATION TOE MPJ T1,T2 LEFT;  Surgeon: Samara Deist, DPM;  Location: [facility] OR[facility];  Service: Podiatry;  Laterality: Left;   APPLICATION OF WOUND VAC Left 01/12/2018   Procedure: APPLICATION OF WOUND VAC;  Surgeon: Algernon Huxley, MD;  Location: [facility] OR[facility];  Service: Vascular;  Laterality: Left;   BELOW KNEE LEG AMPUTATION Right    CATARACT EXTRACTION, BILATERAL  Bilateral    COLONOSCOPY N/A 02/19/2020   Procedure: COLONOSCOPY;  Surgeon: Lesly Rubenstein, MD;  Location: [facility] ENDOSCOPY;  Service: Endoscopy;  Laterality: N/A;   COLONOSCOPY WITH PROPOFOL N/A 08/27/2020   Procedure: COLONOSCOPY WITH PROPOFOL;  Surgeon: Lesly Rubenstein, MD;  Location: [facility] ENDOSCOPY;  Service: Endoscopy;  Laterality: N/A;  REQ MID AM   CORONARY ANGIOPLASTY  2005   stent x 1 placed   ENDARTERECTOMY FEMORAL Left 08/11/2017   Procedure: ENDARTERECTOMY FEMORAL;  Surgeon: Algernon Huxley, MD;  Location: [facility];  Service: Vascular;  Laterality: Left;    ESOPHAGOGASTRODUODENOSCOPY N/A 02/19/2020   Procedure: ESOPHAGOGASTRODUODENOSCOPY (EGD);  Surgeon: Lesly Rubenstein, MD;  Location: [facility] ENDOSCOPY;  Service: Endoscopy;  Laterality: N/A;   HEMATOMA EVACUATION Left 01/12/2018   Procedure: EVACUATION HEMATOMA ( DRAINING OF SEROMA);  Surgeon: Algernon Huxley, MD;  Location: [facility] ORS;  Service: Vascular;  Laterality: Left;   IMPLANTABLE CARDIOVERTER DEFIBRILLATOR (ICD) GENERATOR CHANGE Left 02/10/2017   Procedure: ICD GENERATOR CHANGE;  Surgeon: Isaias Cowman, MD;  Location: [facility];  Service: Cardiovascular;  Laterality: Left;   INSERT / REPLACE / REMOVE PACEMAKER  [id number], 2018   LOWER EXTREMITY ANGIOGRAPHY Left 06/07/2017   Procedure: LOWER EXTREMITY ANGIOGRAPHY;  Surgeon: Algernon Huxley, MD;  Location: [place] CV LAB;  Service: Cardiovascular;  Laterality: Left;   LOWER EXTREMITY ANGIOGRAPHY Left 10/05/2019   Procedure: LOWER EXTREMITY ANGIOGRAPHY;  Surgeon: Algernon Huxley, MD;  Location: [place] CV LAB;  Service: Cardiovascular;  Laterality: Left;   LOWER EXTREMITY INTERVENTION  06/07/2017   Procedure: LOWER EXTREMITY INTERVENTION;  Surgeon: Algernon Huxley, MD;  Location: [place] CV LAB;  Service: Cardiovascular;;   PERIPHERAL VASCULAR CATHETERIZATION Left 12/09/2015   Procedure: Lower Extremity Angiography;  Surgeon: Algernon Huxley, MD;  Location: [place] [facility];  Service: Cardiovascular;  Laterality: Left;   PERIPHERAL VASCULAR CATHETERIZATION  12/09/2015   Procedure: Lower Extremity Intervention;  Surgeon: Algernon Huxley, MD;  Location: [place] [facility] LAB;  Service: Cardiovascular;;   TONSILLECTOMY     TRANSMETATARSAL AMPUTATION Left 10/20/2019   Procedure: TRANSMETATARSAL AMPUTATION;  Surgeon: Sharlotte Alamo, DPM;  Location: [facility] OR[facility];  Service: Podiatry;  Laterality: Left;      Social History   Socioeconomic History   Marital status: Married    Spouse name: Bethena Roys   Number of children: Not on file   Years of education: Not  on file   Highest education level: Not on file  Occupational History   Occupation: no employment    Comment: disabled  Tobacco Use   Smoking status: Former    Packs/day: 1.00    Years: 42.00    Total pack years: 42.00    Types: Cigarettes    Quit date: 09/23/2013    Years since quitting: 8.4   Smokeless tobacco: Never  Vaping Use   Vaping Use: Never used  Substance and Sexual Activity   Alcohol use: No    Comment: quit 6 years ago   Drug use: No   Sexual activity: Yes  Other Topics Concern   Not on file  Social History Narrative   Patient lives with wife and a dog.   Social Determinants of Health   Financial Resource Strain: Not on file  Food Insecurity: No Food Insecurity (07/31/2021)   Hunger Vital Sign    Worried About Running Out of Food in the Last Year: Never true    Ran Out of Food in the Last Year: Never true  Transportation Needs: No Transportation Needs (08/06/2020)   PRAPARE - Hydrologist (Medical): No    Lack of Transportation (Non-Medical): No  Physical Activity: Not on file  Stress: No Stress Concern Present (02/01/2020)   [place]    Feeling of Stress : Not at all  Social Connections: Not on file      CURRENT MEDS:  Current Facility-Administered Medications (Endocrine & Metabolic):    methylPREDNISolone sodium succinate (SOLU-MEDROL) 125 mg/2 mL injection 60 mg **IN FOLLOWED-BY LINKED GROUP WITH** [START ON 03/12/2022] predniSONE (DELTASONE) tablet 40 mg  Current Outpatient Medications (Endocrine & Metabolic):    JARDIANCE 10 MG TABS tablet, Take 10 mg by mouth daily with breakfast.   predniSONE (DELTASONE) 20 MG tablet, Take 20 mg by mouth daily.  Current Facility-Administered Medications (Cardiovascular):    hydrALAZINE (APRESOLINE) injection 5 mg   metoprolol succinate (TOPROL-XL) 24 hr tablet 25 mg   sacubitril-valsartan (ENTRESTO) 24-26 mg per  tablet  Current Outpatient Medications (Cardiovascular):    ENTRESTO 24-26 MG, Take 1 tablet by mouth 2 (two) times daily.   EPINEPHrine 0.3 mg/0.3 mL IJ SOAJ injection, Inject into the muscle.   furosemide (LASIX) 20 MG tablet, Take 2 tablets (40 mg total) by mouth daily. (Patient taking differently: Take 20 mg by mouth daily.)   lovastatin (MEVACOR) 20 MG tablet, Take 1 tablet by mouth daily.   metoprolol succinate (TOPROL-XL) 25 MG 24 hr tablet, Take 25 mg by mouth daily.   nitroGLYCERIN (NITROSTAT) 0.4 MG SL tablet, Place 0.4 mg under the tongue every 5 (five) minutes as needed for chest pain.   sildenafil (VIAGRA) 25 MG tablet, Take by mouth.  Current Facility-Administered Medications (Respiratory):    albuterol (PROVENTIL) (2.5 MG/3ML) 0.083% nebulizer solution 2.5 mg   budesonide (PULMICORT) nebulizer solution 0.5 mg   guaiFENesin (MUCINEX) 12 hr tablet 600 mg   ipratropium (ATROVENT) 0.03 % nasal spray 2 spray   ipratropium-albuterol (DUONEB) 0.5-2.5 (3) MG/3ML nebulizer solution 3 mL  Current Outpatient Medications (Respiratory):    budesonide (PULMICORT) 0.5 MG/2ML nebulizer solution, Take 0.5 mg by nebulization 2 (two) times daily.   fluticasone (FLONASE) 50 MCG/ACT nasal spray, Place 2 sprays into both nostrils daily. (Patient not taking: Reported on 06/05/2021)   fluticasone-salmeterol (ADVAIR HFA) 115-21 MCG/ACT inhaler, USE 2 INHALATIONS ORALLY EVERY 12 HOURS (Patient taking differently: Inhale 2 puffs into the lungs every 12 (twelve) hours.)   guaiFENesin (MUCINEX) 600 MG 12 hr tablet, Take 600 mg by mouth every evening.    ipratropium (ATROVENT) 0.03 % nasal spray, Place 2 sprays into both nostrils 2 (two) times daily.    Ipratropium-Albuterol (COMBIVENT) 20-100 MCG/ACT AERS respimat, Inhale 1 puff into the lungs every 4 (four) hours.   montelukast (SINGULAIR) 10 MG tablet, Take 10 mg by mouth at bedtime.   roflumilast (DALIRESP) 500 MCG TABS tablet, Take 250 mcg by mouth  daily. (Patient not taking: Reported on 06/05/2021)   SPIRIVA RESPIMAT 1.25 MCG/ACT AERS, INHALE 2 PUFFS BY MOUTH INTO THE LUNGS DAILY (Patient not taking: Reported on 06/05/2021)  Current Facility-Administered Medications (Analgesics):    acetaminophen (TYLENOL) tablet 650 mg **OR** acetaminophen (TYLENOL) suppository 650 mg  Current Outpatient Medications (Analgesics):    acetaminophen (TYLENOL) 500 MG tablet, Take 1,000 mg by mouth daily as needed for moderate pain or headache.   aspirin EC 81 MG tablet, Take 1 tablet (81 mg total) by mouth daily. (Patient taking differently: Take 81 mg  by mouth daily after supper.)   oxyCODONE-acetaminophen (PERCOCET) 7.5-325 MG tablet, Take 1-2 tablets by mouth every 6 (six) hours as needed for moderate pain.   Current Outpatient Medications (Hematological):    vitamin B-12 (CYANOCOBALAMIN) 1000 MCG tablet, Take 1,000 mcg by mouth daily.   warfarin (COUMADIN) 5 MG tablet, Take 5 mg by mouth daily.  Current Facility-Administered Medications (Other):    cefTRIAXone (ROCEPHIN) 1 g in sodium chloride 0.9 % 100 mL IVPB   citalopram (CELEXA) tablet 20 mg   doxycycline (VIBRA-TABS) tablet 100 mg   gabapentin (NEURONTIN) capsule 200 mg   lactated ringers infusion   nicotine (NICODERM CQ - dosed in mg/24 hours) patch 14 mg   sodium chloride flush (NS) 0.9 % injection 3 mL   Warfarin - Pharmacist Dosing Inpatient  Current Outpatient Medications (Other):    ALPRAZolam (XANAX) 1 MG tablet, Take 1 mg by mouth 2 (two) times daily as needed for anxiety or sleep.    citalopram (CELEXA) 10 MG tablet, Take 10 mg by mouth daily.   docusate sodium (COLACE) 100 MG capsule, Take 100 mg by mouth daily as needed (for constipation.).    Dupilumab (DUPIXENT) 300 MG/2ML SOPN, Inject 300 mg into the skin every 14 (fourteen) days. Inject 2 mLs ('300mg'$ ).   gabapentin (NEURONTIN) 100 MG capsule, Take 300 mg by mouth in the morning, at noon, and at bedtime.   Gauze Pads &  Dressings (RESTORE FOAM DRESSING) 6"X8" PADS, by Does not apply route. (Patient not taking: Reported on 06/05/2021)   pantoprazole (PROTONIX) 40 MG tablet, Take 40 mg by mouth daily before breakfast.    Respiratory Therapy Supplies (FLUTTER) DEVI, Use as directed.   tamsulosin (FLOMAX) 0.4 MG CAPS capsule, Take 0.4 mg by mouth daily after supper.  (Patient not taking: Reported on 06/05/2021)   Wound Dressings (RESTORE WOUND CARE DRESSING) PADS, Apply 1 each topically daily. (Patient not taking: Reported on 06/05/2021)    ED Course: Pt in Ed patient is alert and oriented. Vitals:   03/10/22 2030 03/10/22 2200 03/10/22 2230 03/11/22 0015  BP: 102/83 103/70 (!) 130/111 104/70  Pulse: 77 78 73 75  Resp: '17  18 18  '$ Temp:    98.4 F (36.9 C)  TempSrc:    Oral  SpO2:   100% 100%   Total I/O In: 400 [IV Piggyback:400] Out: -  SpO2: 100 % O2 Flow Rate (L/min): 4 L/min FiO2 (%): 40 % Blood work in ed shows : Leukocytosis with a white count of 20.1 hyponatremia sodium 134, glucose of 121. Troponin rise from 21-47. Elevated lactic acid from 2.3-2.2.  Normal kidney function. LFTs added on and are pending. CT angio of the chest shows no PE, advanced emphysema.  Results for orders placed or performed during the hospital encounter of 03/10/22 (from the past 48 hour(s))  SARS Coronavirus 2 by RT PCR (hospital order, performed in [facility] hospital lab) *cepheid single result test* Anterior Nasal Swab     Status: None   Collection Time: 03/10/22  2:57 PM   Specimen: Anterior Nasal Swab  Result Value Ref Range   SARS Coronavirus 2 by RT PCR NEGATIVE NEGATIVE    Comment: (NOTE) SARS-CoV-2 target nucleic acids are NOT DETECTED.  The SARS-CoV-2 RNA is generally detectable in upper and lower respiratory specimens during the acute phase of infection. The lowest concentration of SARS-CoV-2 viral copies this assay can detect is 250 copies / mL. A negative result does not preclude SARS-CoV-2  infection and should  not be used as the sole basis for treatment or other patient management decisions.  A negative result may occur with improper specimen collection / handling, submission of specimen other than nasopharyngeal swab, presence of viral mutation(s) within the areas targeted by this assay, and inadequate number of viral copies (<250 copies / mL). A negative result must be combined with clinical observations, patient history, and epidemiological information.  Fact Sheet for Patients:   https://www.Dalal Livengood.info/  Fact Sheet for Healthcare Providers: https://hall.com/  This test is not yet approved or  cleared by the [place] FDA and has been authorized for detection and/or diagnosis of SARS-CoV-2 by FDA under an Emergency Use Authorization (EUA).  This EUA will remain in effect (meaning this test can be used) for the duration of the COVID-19 declaration under Section 564(b)(1) of the Act, 21 U.S.C. section 360bbb-3(b)(1), unless the authorization is terminated or revoked sooner.  Performed at [facility], [address] [place], [place] [address]   CBC with Differential     Status: Abnormal   Collection Time: 03/10/22  3:04 PM  Result Value Ref Range   WBC 20.1 (H) 4.0 - 10.5 K/uL   RBC 4.72 4.22 - 5.81 MIL/uL   Hemoglobin 14.1 13.0 - 17.0 g/dL   HCT 44.2 39.0 - 52.0 %   MCV 93.6 80.0 - 100.0 fL   MCH 29.9 26.0 - 34.0 pg   MCHC 31.9 30.0 - 36.0 g/dL   RDW 13.5 11.5 - 15.5 %   Platelets 224 150 - 400 K/uL   nRBC 0.0 0.0 - 0.2 %   Neutrophils Relative % 88 %   Neutro Abs 17.8 (H) 1.7 - 7.7 K/uL   Lymphocytes Relative 4 %   Lymphs Abs 0.7 0.7 - 4.0 K/uL   Monocytes Relative 7 %   Monocytes Absolute 1.4 (H) 0.1 - 1.0 K/uL   Eosinophils Relative 0 %   Eosinophils Absolute 0.0 0.0 - 0.5 K/uL   Basophils Relative 0 %   Basophils Absolute 0.1 0.0 - 0.1 K/uL   Immature Granulocytes 1 %   Abs Immature  Granulocytes 0.12 (H) 0.00 - 0.07 K/uL    Comment: Performed at [facility], [address], [place], [place] [address]  Basic metabolic panel     Status: Abnormal   Collection Time: 03/10/22  3:04 PM  Result Value Ref Range   Sodium 134 (L) 135 - 145 mmol/L   Potassium 3.8 3.5 - 5.1 mmol/L   Chloride 97 (L) 98 - 111 mmol/L   CO2 28 22 - 32 mmol/L   Glucose, Bld 121 (H) 70 - 99 mg/dL    Comment: Glucose reference range applies only to samples taken after fasting for at least 8 hours.   BUN 14 8 - 23 mg/dL   Creatinine, Ser 1.08 0.61 - 1.24 mg/dL   Calcium 8.9 8.9 - 10.3 mg/dL   GFR, Estimated >60 >60 mL/min    Comment: (NOTE) Calculated using the CKD-EPI Creatinine Equation (2021)    Anion gap 9 5 - 15    Comment: Performed at [facility], [place],  [address]  Troponin I (High Sensitivity)     Status: Abnormal   Collection Time: 03/10/22  3:04 PM  Result Value Ref Range   Troponin I (High Sensitivity) 21 (H) <18 ng/L    Comment: (NOTE) Elevated high sensitivity troponin I (hsTnI) values and significant  changes across serial measurements may suggest ACS but many other  chronic  and acute conditions are known to elevate hsTnI results.  Refer to the "Links" section for chest pain algorithms and additional  guidance. Performed at [facility], [place], [place] [address]   Lactic acid, plasma     Status: Abnormal   Collection Time: 03/10/22  3:04 PM  Result Value Ref Range   Lactic Acid, Venous 2.3 (HH) 0.5 - 1.9 mmol/L    Comment: CRITICAL RESULT CALLED TO, READ BACK BY AND VERIFIED WITH EFRAIN CAMPOS AT 1542 ON 03/10/22 BY SS Performed at [facility], [address] [place], [place] [address]   Brain natriuretic peptide     Status: None   Collection Time: 03/10/22  3:04 PM  Result Value Ref Range   B Natriuretic Peptide 97.6 0.0 - 100.0 pg/mL    Comment: Performed at [facility],  [address] [place], [place] [address]  Protime-INR     Status: Abnormal   Collection Time: 03/10/22  5:02 PM  Result Value Ref Range   Prothrombin Time 23.8 (H) 11.4 - 15.2 seconds   INR 2.2 (H) 0.8 - 1.2    Comment: (NOTE) INR goal varies based on device and disease states. Performed at [facility], [address] [place], [place] [address]   APTT     Status: None   Collection Time: 03/10/22  5:02 PM  Result Value Ref Range   aPTT 35 24 - 36 seconds    Comment: Performed at [facility], [address] [place], [place] [address]  Blood gas, venous     Status: Abnormal   Collection Time: 03/10/22  5:37 PM  Result Value Ref Range   pH, Ven 7.24 (L) 7.25 - 7.43   pCO2, Ven 58 44 - 60 mmHg   pO2, Ven 58 (H) 32 - 45 mmHg   Bicarbonate 25.5 20.0 - 28.0 mmol/L   Acid-base deficit 3.4 (H) 0.0 - 2.0 mmol/L   O2 Saturation 89.8 %   Patient temperature 36.0    Collection site VEIN     Comment: Performed at [facility], [address] [place], [place] [address]  Lactic acid, plasma     Status: Abnormal   Collection Time: 03/10/22  6:50 PM  Result Value Ref Range   Lactic Acid, Venous 2.2 (HH) 0.5 - 1.9 mmol/L    Comment: CRITICAL VALUE NOTED. VALUE IS CONSISTENT WITH PREVIOUSLY REPORTED/CALLED VALUE SS/GAA Performed at [facility], [place], [place] [address]   Troponin I (High Sensitivity)     Status: Abnormal   Collection Time: 03/10/22  6:50 PM  Result Value Ref Range   Troponin I (High Sensitivity) 47 (H) <18 ng/L    Comment: READ BACK AND VERIFIED WITH EFRAIN CAMPOS SANCHEZ AT 0109 03/10/2022 GAA (NOTE) Elevated high sensitivity troponin I (hsTnI) values and significant  changes across serial measurements may suggest ACS but many other  chronic and acute conditions are known to elevate hsTnI results.  Refer to the "Links" section for chest pain algorithms and additional  guidance. Performed at [facility], [address] [place],  [address]   Blood gas, venous     Status: Abnormal   Collection Time: 03/10/22  6:50 PM  Result Value Ref Range   pH, Ven 7.37 7.25 - 7.43   pCO2, Ven 44 44 - 60 mmHg   pO2, Ven 67 (H) 32 - 45 mmHg   Bicarbonate 26.0 20.0 - 28.0 mmol/L  Acid-Base Excess 0.0 0.0 - 2.0 mmol/L   O2 Saturation 94.7 %   Patient temperature 36.0     Comment: Performed at [facility], [address] [place], [place] [address]    In Ed pt received nebulizers, steroids, Rocephin and Doxy. Meds ordered this encounter  Medications   ipratropium-albuterol (DUONEB) 0.5-2.5 (3) MG/3ML nebulizer solution 9 mL   predniSONE (DELTASONE) tablet 60 mg   lactated ringers bolus 500 mL   cefTRIAXone (ROCEPHIN) 1 g in sodium chloride 0.9 % 100 mL IVPB    Order Specific Question:   Antibiotic Indication:    Answer:   CAP   doxycycline (VIBRA-TABS) tablet 100 mg   ipratropium-albuterol (DUONEB) 0.5-2.5 (3) MG/3ML nebulizer solution 3 mL   budesonide (PULMICORT) nebulizer solution 0.5 mg   citalopram (CELEXA) tablet 20 mg   sacubitril-valsartan (ENTRESTO) 24-26 mg per tablet    Order Specific Question:   ACE-inhibitors have NOT been administered in the past 36-hours.    Answer:   NOT APPLICABLE (continuing home med)   gabapentin (NEURONTIN) capsule 200 mg   metoprolol succinate (TOPROL-XL) 24 hr tablet 25 mg   ipratropium (ATROVENT) 0.03 % nasal spray 2 spray   LORazepam (ATIVAN) injection 1 mg   iohexol (OMNIPAQUE) 350 MG/ML injection 75 mL   FOLLOWED BY Linked Order Group    methylPREDNISolone sodium succinate (SOLU-MEDROL) 125 mg/2 mL injection 60 mg     IV methylprednisolone will be converted to either a q12h or q24h frequency with the same total daily dose (TDD).  Ordered Dose: 1 to 125 mg TDD; convert to: TDD q24h.  Ordered Dose: 126 to 250 mg TDD; convert to: TDD div q12h.  Ordered Dose: >250 mg TDD; DAW.    predniSONE (DELTASONE) tablet 40 mg    ipratropium-albuterol (DUONEB) 0.5-2.5 (3) MG/3ML nebulizer solution 3 mL   albuterol (PROVENTIL) (2.5 MG/3ML) 0.083% nebulizer solution 2.5 mg   sodium chloride flush (NS) 0.9 % injection 3 mL   lactated ringers infusion   OR Linked Order Group    acetaminophen (TYLENOL) tablet 650 mg    acetaminophen (TYLENOL) suppository 650 mg   guaiFENesin (MUCINEX) 12 hr tablet 600 mg   nicotine (NICODERM CQ - dosed in mg/24 hours) patch 14 mg   hydrALAZINE (APRESOLINE) injection 5 mg   cefTRIAXone (ROCEPHIN) 1 g in sodium chloride 0.9 % 100 mL IVPB    Order Specific Question:   Antibiotic Indication:    Answer:   Other Indication (list below)    Order Specific Question:   Other Indication:    Answer:   COPD exacerbation   Warfarin - Pharmacist Dosing Inpatient   doxycycline (VIBRA-TABS) tablet 100 mg    Unresulted Labs (From admission, onward)     Start     Ordered   03/11/22 0500  Protime-INR  Tomorrow morning,   R        03/11/22 0059   03/11/22 0145  Hepatic function panel  Add-on,   AD        03/11/22 0144   03/10/22 2250  HIV Antibody (routine testing w rflx)  (HIV Antibody (Routine testing w reflex) panel)  Once,   URGENT        03/10/22 2254   03/10/22 2241  Respiratory (~20 pathogens) panel by PCR  (Respiratory panel by PCR (~20 pathogens, ~24 hr TAT)  w precautions)  Once,   URGENT        03/10/22 2240   03/10/22 1712  Comprehensive metabolic panel  Once,   STAT        03/10/22 1712   03/10/22 1613  Blood Culture (routine x 2)  (Undifferentiated presentation (screening labs and basic nursing orders))  BLOOD CULTURE X 2,   STAT      03/10/22 1613   03/10/22 1613  Urinalysis, Complete w Microscopic  (Undifferentiated presentation (screening labs and basic nursing orders))  ONCE - URGENT,   URGENT        03/10/22 1613   03/10/22 1613  Urine Culture  (Undifferentiated presentation (screening labs and basic nursing orders))  ONCE - URGENT,   URGENT       Question:  Indication  Answer:   Sepsis   03/10/22 1613   03/10/22 1517  Resp Panel by RT-PCR (Flu A&B, Covid) Anterior Nasal Swab  Once,   URGENT        03/10/22 1517           Admission Imaging : CT Angio Chest Pulmonary Embolism (PE) W or WO Contrast  Result Date: 03/10/2022 CLINICAL DATA:  Past medical history of COPD. Baseline oxygen requirement is 2 L. shortness of breath for couple of days. EXAM: CT ANGIOGRAPHY CHEST WITH CONTRAST TECHNIQUE: Multidetector CT imaging of the chest was performed using the standard protocol during bolus administration of intravenous contrast. Multiplanar CT image reconstructions and MIPs were obtained to evaluate the vascular anatomy. RADIATION DOSE REDUCTION: This exam was performed according to the departmental dose-optimization program which includes automated exposure control, adjustment of the mA and/or kV according to patient size and/or use of iterative reconstruction technique. CONTRAST:  18m OMNIPAQUE IOHEXOL 350 MG/ML SOLN COMPARISON:  Chest radiograph performed earlier on the same date FINDINGS: Cardiovascular: Satisfactory opacification of the pulmonary arteries to the segmental level. No evidence of pulmonary embolism. Normal heart size. No pericardial effusion. Pacemaker leads terminating in the ventricles. Aortic atherosclerotic calcifications. Mediastinum/Nodes: No enlarged mediastinal, hilar, or axillary lymph nodes. Thyroid gland, trachea, and esophagus demonstrate no significant findings. Lungs/Pleura: Advanced emphysematous changes of the lungs. No evidence of pneumonia or pulmonary edema. Mild bibasilar subsegmental atelectasis. Upper Abdomen: No acute abnormality. Musculoskeletal: No chest wall abnormality. No acute or significant osseous findings. Review of the MIP images confirms the above findings. IMPRESSION: 1. No CT evidence of pulmonary embolism. 2. Advanced emphysematous changes of the lungs. 3. No evidence of pneumonia or pulmonary edema. 4. Aortic Atherosclerosis  (ICD10-I70.0) and Emphysema (ICD10-J43.9). Electronically Signed   By: IKeane PoliceD.O.   On: 03/10/2022 21:24   DG Chest 2 View  Result Date: 03/10/2022 CLINICAL DATA:  Short of breath for couple days EXAM: CHEST - 2 VIEW COMPARISON:  11/26/2019 FINDINGS: LEFT-sided pacemaker overlies normal cardiac silhouette. Lungs are clear. No effusion, infiltrate pneumothorax. IMPRESSION: No acute cardiopulmonary process. Electronically Signed   By: SSuzy BouchardM.D.   On: 03/10/2022 16:28      Physical Examination: Vitals:   03/10/22 2030 03/10/22 2200 03/10/22 2230 03/11/22 0015  BP: 102/83 103/70 (!) 130/111 104/70  Pulse: 77 78 73 75  Temp:    98.4 F (36.9 C)  Resp: '17  18 18  '$ SpO2:   100% 100%  TempSrc:    Oral   Physical Exam Vitals and nursing note reviewed.  Constitutional:      General: He is not in acute distress.    Appearance: He is ill-appearing. He is not toxic-appearing or diaphoretic.     Interventions: Face mask in place.  HENT:     Head: Normocephalic and atraumatic.  Right Ear: Hearing and external ear normal.     Left Ear: Hearing and external ear normal.     Nose: Nose normal. No nasal deformity.     Mouth/Throat:     Lips: Pink.     Mouth: Mucous membranes are moist.     Tongue: No lesions.     Pharynx: Oropharynx is clear.  Eyes:     Extraocular Movements: Extraocular movements intact.     Pupils: Pupils are equal, round, and reactive to light.  Neck:     Vascular: No carotid bruit.  Cardiovascular:     Rate and Rhythm: Normal rate and regular rhythm.     Pulses: Normal pulses.     Heart sounds: Normal heart sounds.  Pulmonary:     Effort: Pulmonary effort is normal.     Breath sounds: Examination of the right-middle field reveals wheezing. Examination of the left-middle field reveals wheezing. Examination of the right-lower field reveals wheezing. Examination of the left-lower field reveals wheezing. Wheezing present.  Abdominal:     General:  Bowel sounds are normal. There is no distension.     Palpations: Abdomen is soft. There is no mass.     Tenderness: There is no abdominal tenderness. There is no guarding.     Hernia: No hernia is present.  Musculoskeletal:     Right lower leg: No edema.     Left lower leg: No edema.  Skin:    General: Skin is warm.  Neurological:     General: No focal deficit present.     Mental Status: He is alert.     Cranial Nerves: Cranial nerves 2-12 are intact.     Motor: Motor function is intact.  Psychiatric:        Speech: Speech normal.        Behavior: Behavior is cooperative.      Assessment and Plan: * SOB (shortness of breath) Attribute to patient's COPD. Supplemental oxygen and NIPPV therapy as needed. Pulmonology consult in a.m. as needed.  Acute on chronic respiratory failure with hypoxia (HCC) SpO2: 100 % O2 Flow Rate (L/min): 4 L/min FiO2 (%): 40 % Patient currently stable on BiPAP. Blood pressure 104/70, pulse 75, temperature 98.4 F (36.9 C), temperature source Oral, resp. rate 18, SpO2 100 %.     Chronic systolic CHF (congestive heart failure), NYHA class 3 (HCC) With patient's history of congestive heart failure with reduced ejection fraction and AICD placement, and concerned that patient's shortness of breath may be an anginal equivalent for him. We have consulted cardiology.  COPD (chronic obstructive pulmonary disease) ([place]) Continue with antibiotic regimen Rocephin and doxycycline along with steroid taper.  AF (paroxysmal atrial fibrillation) (HCC) EKG today shows sinus rhythm PR interval of 91, right bundle branch block.   Coronary artery disease No chest pain, mild troponin elevation from 21-47 cardiology consulted will defer to additional ischemic evaluation per cardiology.  AICD (automatic cardioverter/defibrillator) present Patient denies any shocks from his device.  He has not been called or contacted for any abnormalities in any rhythm  disturbances per the monitoring company.  Interrogation per cardiology.  Sleep apnea CPAP per home settings.  Essential hypertension, benign Vitals:   03/10/22 1530 03/10/22 1600 03/10/22 1630 03/10/22 1700  BP: (!) 96/56 (!) 98/51 113/71 (!) 77/69   03/10/22 1730 03/10/22 1800 03/10/22 1840 03/10/22 2000  BP: (!) 88/73 93/67 115/68 92/62   03/10/22 2030 03/10/22 2200 03/10/22 2230 03/11/22 0015  BP: 102/83 103/70 (!) 130/111 104/70  Urine hydralazine, continue metoprolol.  BPH (benign prostatic hyperplasia) No obstructive symptoms. Lab Results  Component Value Date   CREATININE 1.08 03/10/2022   CREATININE 0.71 03/15/2020   CREATININE 0.70 11/30/2019  Continue patient's Flomax.    DVT prophylaxis:  Coumadin  Code Status:  Full code  Family Communication:  Annamaria Boots : [phone].  Disposition Plan:  Home  Consults called:  Cardiology  Admission status: Inpatient  Unit/ Expected LOS: Progressive/2 days.   Para Skeans MD [facility]  6 PM- 2 AM. Please contact me via secure Chat 6 PM-2 AM. [phone] ( Pager ) To contact the [facility] Attending or Consulting provider [place] or covering provider during after hours [place], for this patient.   Check the care team in [facility] and look for a) attending/consulting [facility] provider listed and b) the [facility] team listed Log into [email or web address] and use [place]'s universal password to access. If you do not have the password, please contact the hospital operator. Locate the [facility] provider you are looking for under [facility] and page to a number that you can be directly reached. If you still have difficulty reaching the provider, please page the [facility] (Director on Call) for the Hospitalists listed on amion for assistance. [email or web address] 03/11/2022, 1:53 AM

## 2022-03-11 ENCOUNTER — Inpatient Hospital Stay
Admit: 2022-03-11 | Discharge: 2022-03-11 | Disposition: A | Discharge status: Home / Self Care | Payer: Medicare HMO | Attending: Internal Medicine | Admitting: Internal Medicine

## 2022-03-11 ENCOUNTER — Other Ambulatory Visit: Payer: Self-pay

## 2022-03-11 DIAGNOSIS — J9621 Acute and chronic respiratory failure with hypoxia: Secondary | ICD-10-CM

## 2022-03-11 DIAGNOSIS — J9602 Acute respiratory failure with hypercapnia: Secondary | ICD-10-CM

## 2022-03-11 DIAGNOSIS — Z95810 Presence of automatic (implantable) cardiac defibrillator: Secondary | ICD-10-CM

## 2022-03-11 DIAGNOSIS — N400 Enlarged prostate without lower urinary tract symptoms: Secondary | ICD-10-CM

## 2022-03-11 DIAGNOSIS — I480 Paroxysmal atrial fibrillation: Secondary | ICD-10-CM

## 2022-03-11 LAB — CBC
HCT: 40.2 % (ref 39.0–52.0)
Hemoglobin: 13 g/dL (ref 13.0–17.0)
MCH: 29.7 pg (ref 26.0–34.0)
MCHC: 32.3 g/dL (ref 30.0–36.0)
MCV: 91.8 fL (ref 80.0–100.0)
Platelets: 245 10*3/uL (ref 150–400)
RBC: 4.38 MIL/uL (ref 4.22–5.81)
RDW: 13.8 % (ref 11.5–15.5)
WBC: 17.5 10*3/uL — ABNORMAL HIGH (ref 4.0–10.5)
nRBC: 0 % (ref 0.0–0.2)

## 2022-03-11 LAB — COMPREHENSIVE METABOLIC PANEL
ALT: 24 U/L (ref 0–44)
AST: 31 U/L (ref 15–41)
Albumin: 4.1 g/dL (ref 3.5–5.0)
Alkaline Phosphatase: 51 U/L (ref 38–126)
Anion gap: 14 (ref 5–15)
BUN: 14 mg/dL (ref 8–23)
CO2: 24 mmol/L (ref 22–32)
Calcium: 9 mg/dL (ref 8.9–10.3)
Chloride: 96 mmol/L — ABNORMAL LOW (ref 98–111)
Creatinine, Ser: 0.95 mg/dL (ref 0.61–1.24)
GFR, Estimated: >60 mL/min (ref >60)
Glucose, Bld: 142 mg/dL — ABNORMAL HIGH (ref 70–99)
Potassium: 4.3 mmol/L (ref 3.5–5.1)
Sodium: 134 mmol/L — ABNORMAL LOW (ref 135–145)
Total Bilirubin: 1 mg/dL (ref 0.3–1.2)
Total Protein: 6.9 g/dL (ref 6.5–8.1)

## 2022-03-11 LAB — HEPATIC FUNCTION PANEL
ALT: 24 U/L (ref 0–44)
AST: 29 U/L (ref 15–41)
Albumin: 3.6 g/dL (ref 3.5–5.0)
Alkaline Phosphatase: 42 U/L (ref 38–126)
Bilirubin, Direct: 0.1 mg/dL (ref 0.0–0.2)
Indirect Bilirubin: 0.5 mg/dL (ref 0.3–0.9)
Total Bilirubin: 0.6 mg/dL (ref 0.3–1.2)
Total Protein: 5.8 g/dL — ABNORMAL LOW (ref 6.5–8.1)

## 2022-03-11 LAB — TROPONIN I (HIGH SENSITIVITY): Troponin I (High Sensitivity): 51 ng/L — ABNORMAL HIGH (ref <18)

## 2022-03-11 LAB — RESPIRATORY PANEL BY PCR

## 2022-03-11 LAB — URINALYSIS, COMPLETE (UACMP) WITH MICROSCOPIC
Bilirubin Urine: NEGATIVE
Glucose, UA: >=500 mg/dL — AB
Ketones, ur: 20 mg/dL — AB
Leukocytes,Ua: NEGATIVE
Nitrite: NEGATIVE
Protein, ur: 30 mg/dL — AB
Specific Gravity, Urine: 1.038 — ABNORMAL HIGH (ref 1.005–1.030)
pH: 5 (ref 5.0–8.0)

## 2022-03-11 LAB — PROTIME-INR
INR: 2.3 — ABNORMAL HIGH (ref 0.8–1.2)
Prothrombin Time: 24.7 seconds — ABNORMAL HIGH (ref 11.4–15.2)

## 2022-03-11 MED ORDER — GUAIFENESIN ER 600 MG PO TB12
600.0000 mg | ORAL_TABLET | Freq: Every evening | ORAL | Status: DC
  Administered 2022-03-11 – 2022-03-12 (×2): 600 mg via ORAL
  Filled 2022-03-11 (×2): qty 1

## 2022-03-11 MED ORDER — IPRATROPIUM-ALBUTEROL 0.5-2.5 (3) MG/3ML IN SOLN
3.0000 mL | RESPIRATORY_TRACT | Status: DC
  Administered 2022-03-11 – 2022-03-12 (×7): 3 mL via RESPIRATORY_TRACT
  Filled 2022-03-11 (×7): qty 3

## 2022-03-11 MED ORDER — WARFARIN SODIUM 4 MG PO TABS: 4.0000 mg | ORAL_TABLET | Freq: Every day | ORAL | Status: DC

## 2022-03-11 MED ORDER — MORPHINE SULFATE (PF) 2 MG/ML IV SOLN
1.0000 mg | INTRAVENOUS | Status: DC | PRN
  Administered 2022-03-11 – 2022-03-13 (×7): 1 mg via INTRAVENOUS
  Filled 2022-03-11 (×6): qty 1

## 2022-03-11 MED ORDER — WARFARIN SODIUM 4 MG PO TABS
4.0000 mg | ORAL_TABLET | Freq: Once | ORAL | Status: AC
  Administered 2022-03-11: 4 mg via ORAL
  Filled 2022-03-11: qty 1

## 2022-03-11 MED ORDER — ARFORMOTEROL TARTRATE 15 MCG/2ML IN NEBU
15.0000 ug | INHALATION_SOLUTION | Freq: Two times a day (BID) | RESPIRATORY_TRACT | Status: DC
  Administered 2022-03-11 – 2022-03-13 (×5): 15 ug via RESPIRATORY_TRACT
  Filled 2022-03-11 (×6): qty 2

## 2022-03-11 MED ORDER — METHYLPREDNISOLONE SODIUM SUCC 125 MG IJ SOLR
60.0000 mg | Freq: Two times a day (BID) | INTRAMUSCULAR | Status: DC
  Administered 2022-03-11 – 2022-03-12 (×4): 60 mg via INTRAVENOUS
  Filled 2022-03-11 (×4): qty 2

## 2022-03-11 MED ORDER — OXYCODONE-ACETAMINOPHEN 7.5-325 MG PO TABS
1.0000 | ORAL_TABLET | Freq: Four times a day (QID) | ORAL | Status: DC | PRN
  Administered 2022-03-12: 2 via ORAL
  Filled 2022-03-11: qty 2

## 2022-03-11 MED ORDER — WARFARIN - PHARMACIST DOSING INPATIENT
Freq: Every day | Status: DC
  Filled 2022-03-11: qty 1

## 2022-03-11 MED ORDER — ALPRAZOLAM 0.5 MG PO TABS: 1.0000 mg | ORAL_TABLET | Freq: Every evening | ORAL | Status: DC | PRN

## 2022-03-11 MED ORDER — DOXYCYCLINE HYCLATE 100 MG PO TABS
100.0000 mg | ORAL_TABLET | Freq: Two times a day (BID) | ORAL | Status: DC
  Administered 2022-03-11 – 2022-03-13 (×6): 100 mg via ORAL
  Filled 2022-03-11 (×6): qty 1

## 2022-03-11 MED ORDER — ALPRAZOLAM 0.5 MG PO TABS
1.0000 mg | ORAL_TABLET | Freq: Two times a day (BID) | ORAL | Status: DC | PRN
  Administered 2022-03-12: 1 mg via ORAL
  Filled 2022-03-11: qty 2

## 2022-03-11 MED ORDER — METHYLPREDNISOLONE SODIUM SUCC 125 MG IJ SOLR: 60.0000 mg | Freq: Two times a day (BID) | INTRAMUSCULAR | Status: DC

## 2022-03-11 NOTE — Assessment & Plan Note (Signed)
SpO2: 100 % O2 Flow Rate (L/min): 4 L/min FiO2 (%): 40 % Patient currently stable on BiPAP. Blood pressure 104/70, pulse 75, temperature 98.4 F (36.9 C), temperature source Oral, resp. rate 18, SpO2 100 %.

## 2022-03-11 NOTE — Assessment & Plan Note (Signed)
No chest pain, mild troponin elevation from 21-47 cardiology consulted will defer to additional ischemic evaluation per cardiology.

## 2022-03-11 NOTE — Evaluation (Signed)
Physical Therapy Evaluation Patient Details Name: Tyler [name] MRN: [id number] DOB: Jul 18, 1955 Today's Date: 03/11/2022  History of Present Illness  Tyler [name] is a 66 y.o. 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   Clinical Impression  Pt admitted with above diagnosis. Pt received upright in bed agreeable to PT assessment. Pt finishing breathing treatment and per RN returned to 4L/min via [place] prior to mobility. Pt reports at baseline he scoot transfers from varying surfaces with use of L prosthesis. Wife provides assist for all ADL's/IADL's at baseline.   To date, pt demonstrating ability to scoot transfer from R side of bed into recliner with L arm rest down. HR and SPO2 remain WNL. Increased time to perform but otherwise safe technique performing at supervision level.  Brief review provided on LLE positioning to prevent joint contracture and LE therex for strengthening. Pt left in recliner with all needs in reach. Pt near baseline level of function. No acute PT needs identified at this time. PT to sign off. Please re-consult if noted decline in ability to perform baseline scoot transfers.          Recommendations for follow up therapy are one component of a multi-disciplinary discharge planning process, led by the attending physician.  Recommendations may be updated based on patient status, additional functional criteria and insurance authorization.  Follow Up Recommendations No PT follow up      Assistance Recommended at Discharge Frequent or constant Supervision/Assistance  Patient can return home with the following  A little help with walking and/or transfers;Assistance with cooking/housework;Assist for  transportation;A little help with bathing/dressing/bathroom;Help with stairs or ramp for entrance    Equipment Recommendations None recommended by PT  Recommendations for Other Services       Functional Status Assessment Patient has not had a recent decline in their functional status     Precautions / Restrictions Precautions Precautions: Fall Precaution Comments: R AKA and L BKA Restrictions Weight Bearing Restrictions: No      Mobility  Bed Mobility Overal bed mobility: Modified Independent               Patient Response: Cooperative  Transfers Overall transfer level: Needs assistance   Transfers: Bed to chair/wheelchair/BSC            Lateral/Scoot Transfers: Supervision General transfer comment: increased time, performs without need for prosthetic    Ambulation/Gait               General Gait Details: does not ambulate at baseline  Stairs            Wheelchair Mobility    Modified Rankin (Stroke Patients Only)       Balance Overall balance assessment: Needs assistance Sitting-balance support: Bilateral upper extremity supported, Feet unsupported Sitting balance-Leahy Scale: Good       Standing balance-Leahy Scale: Zero Standing balance comment: does not stand at baseline due to BLE amputations                             Pertinent Vitals/Pain Pain Assessment Pain Assessment: No/denies pain    Home Living Family/patient expects to be discharged to:: Private residence Living Arrangements: Spouse/significant other Available Help at  Discharge: Family;Available 24 hours/day Type of Home: Mobile home Home Access: Ramped entrance       Home Layout: One level Home Equipment: Wheelchair - manual;Transport chair (slide board and L prosthesis used for transfers)      Prior Function Prior Level of Function : Needs assist       Physical Assist : ADLs (physical)   ADLs (physical):  Grooming;Bathing;Dressing;Toileting;IADLs Mobility Comments: Transfers mod-I with L prosthesis scooting ADLs Comments: wife assists with ADL's and IADL's     Hand Dominance        Extremity/Trunk Assessment   Upper Extremity Assessment Upper Extremity Assessment: Overall WFL for tasks assessed    Lower Extremity Assessment Lower Extremity Assessment: Generalized weakness;RLE deficits/detail;LLE deficits/detail RLE Deficits / Details: R AKA LLE Deficits / Details: L BKA    Cervical / Trunk Assessment Cervical / Trunk Assessment: Normal  Communication   Communication: No difficulties  Cognition Arousal/Alertness: Awake/alert Behavior During Therapy: WFL for tasks assessed/performed Overall Cognitive Status: Within Functional Limits for tasks assessed                                          General Comments General comments (skin integrity, edema, etc.): SPO2 > 90% on 4L/min at rest and with transfers. Some SOB noted post transfer.    Exercises Other Exercises Other Exercises: Role of PT in acute setting, no need for d/c recs, review of LLE positioning to prevent knee joint contracture, review of LE therex.   Assessment/Plan    PT Assessment Patient does not need any further PT services  PT Problem List Decreased strength;Decreased activity tolerance;Decreased mobility       PT Treatment Interventions      PT Goals (Current goals can be found in the Care Plan section)  Acute Rehab PT Goals PT Goal Formulation: All assessment and education complete, DC therapy    Frequency       Co-evaluation               AM-PAC PT "6 Clicks" Mobility  Outcome Measure Help needed turning from your back to your side while in a flat bed without using bedrails?: A Little Help needed moving from lying on your back to sitting on the side of a flat bed without using bedrails?: A Little Help needed moving to and from a bed to a chair (including a  wheelchair)?: A Little Help needed standing up from a chair using your arms (e.g., wheelchair or bedside chair)?: Total Help needed to walk in hospital room?: Total Help needed climbing 3-5 steps with a railing? : Total 6 Click Score: 12    End of Session Equipment Utilized During Treatment: Oxygen Activity Tolerance: Patient tolerated treatment well Patient left: in chair;with call bell/phone within reach Nurse Communication: Mobility status PT Visit Diagnosis: Other abnormalities of gait and mobility (R26.89);Muscle weakness (generalized) (M62.81)    Time: 2094-7096 PT Time Calculation (min) (ACUTE ONLY): 18 min   Charges:   PT Evaluation $PT Eval Moderate Complexity: [place] M. Fairly IV, PT, DPT Physical Therapist- [facility]  03/11/2022, 10:31 AM

## 2022-03-11 NOTE — Assessment & Plan Note (Signed)
With patient's history of congestive heart failure with reduced ejection fraction and AICD placement, and concerned that patient's shortness of breath may be an anginal equivalent for him. We have consulted cardiology.

## 2022-03-11 NOTE — Assessment & Plan Note (Addendum)
Vitals:   03/10/22 1530 03/10/22 1600 03/10/22 1630 03/10/22 1700  BP: (!) 96/56 (!) 98/51 113/71 (!) 77/69   03/10/22 1730 03/10/22 1800 03/10/22 1840 03/10/22 2000  BP: (!) 88/73 93/67 115/68 92/62   03/10/22 2030 03/10/22 2200 03/10/22 2230 03/11/22 0015  BP: 102/83 103/70 (!) 130/111 104/70  Urine hydralazine, continue metoprolol.

## 2022-03-11 NOTE — Progress Notes (Addendum)
[facility] for Warfarin Indication: h/o DVT ISO Factor 5 Leiden, heterozygous  Allergies  Allergen Reactions   Apixaban Rash   Rivaroxaban Rash    Vital Signs: Temp: 98.4 F (36.9 C) (10/18 1128) Temp Source: Oral (10/18 1128) BP: 135/84 (10/18 1149) Pulse Rate: 90 (10/18 1149)  Labs: Recent Labs    03/10/22 1504 03/10/22 1702 03/10/22 1850 03/11/22 0725 03/11/22 1019  HGB 14.1  --   --   --  13.0  HCT 44.2  --   --   --  40.2  PLT 224  --   --   --  245  APTT  --  35  --   --   --   LABPROT  --  23.8*  --  24.7*  --   INR  --  2.2*  --  2.3*  --   CREATININE 1.08  --  0.95  --   --   TROPONINIHS 21*  --  47* 51*  --      CrCl cannot be calculated (Unknown ideal weight.).   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   Atrial fibrillation (HCC)    Cervical spinal stenosis    with neuropathy   CHF (congestive heart failure) (HCC)    Constipation    COPD (chronic obstructive pulmonary disease) (HCC)    COPD with acute exacerbation ([place]) 10/13/2016   Coronary artery disease    Depression    Dyspnea    Dysrhythmia    atrial fibrillation   Emphysema of lung (HCC)    Factor 5 Leiden mutation, heterozygous ([place])    on coumadin   Factor V Leiden mutation ([place])    GERD (gastroesophageal reflux disease)    Hypertension    Lung nodule seen on imaging study    being followed by dr. [name]. just watching it for last few years, without change   Mitral valve insufficiency    Moderate tricuspid insufficiency    Myocardial infarction [facility]) 2004   stent placed, pacemaker implanted 2005   Osteomyelitis ([place]) 10/2019   left foot   Oxygen dependent    requires 2L nasal prong oxygen 24 hours a day    Peripheral vascular disease ([place])    Presence of permanent cardiac pacemaker [phone]   PVD (peripheral vascular disease) ([place])    Sleep apnea    waiting to have sleep study. Used bipap while hospitalized and said it was great for him.   Medications:  After speaking to patient, confirmed that home dose is warfarin 4 mg daily  Assessment: Pt is 66 yo M with PMH Factor V leiden mutation (on warfarin), Afib, AICD, cervical spinal stenosis with neuropathy, VHD, COPD, emphysema, CAD, depression, HTN, GERD, PVD, OSA. Pt has a history of DVT and PE in the setting of Factor V leiden mutation and is on warfarin chronically at home. Re: Afib, pt's CHADSVASc score is 6. Pt presented hypotensive (was almost admitted to CCU) with worsening cough, SOB, sputum production. Imaging was negative for PE and no evidence of clots at this point. Pt currently VSS, slightly tachypneic, on 4 L [place]. First INR check 2.2 at admission (therapeutic) and INR this morning is 2.3, still therapeutic.  Baseline Labs: aPTT - 35; INR -  2.2 Hgb - 14.1; Plts - 224  Goal of Therapy:  INR 2-3 Monitor platelets by anticoagulation protocol: Yes   Plan:  Give warfarin 4 mg x 1 today in accordance with home regimen If INR still therapeutic tomorrow, consider placing ongoing order for warfarin 4 mg Recheck INR w/ AM labs Monitor CBC at least weekly for patients taking warfarin  Dara Hoyer, PharmD PGY-1 Pharmacy Resident 03/11/2022 12:16 PM

## 2022-03-11 NOTE — Assessment & Plan Note (Signed)
Attribute to patient's COPD. Supplemental oxygen and NIPPV therapy as needed. Pulmonology consult in a.m. as needed.

## 2022-03-11 NOTE — Assessment & Plan Note (Signed)
EKG today shows sinus rhythm PR interval of 91, right bundle branch block.

## 2022-03-11 NOTE — Assessment & Plan Note (Signed)
Patient denies any shocks from his device.  He has not been called or contacted for any abnormalities in any rhythm disturbances per the monitoring company.  Interrogation per cardiology.

## 2022-03-11 NOTE — Progress Notes (Signed)
ANTICOAGULATION CONSULT NOTE  Pharmacy Consult for Warfarin Indication: DVT/Factor 5 Leiden, heterozygous  Allergies  Allergen Reactions   Apixaban Rash   Rivaroxaban Rash    Vital Signs: Temp: 98.4 F (36.9 C) (10/18 0015) Temp Source: Oral (10/18 0015) BP: 104/70 (10/18 0015) Pulse Rate: 75 (10/18 0015)  Labs: Recent Labs    03/10/22 1504 03/10/22 1702 03/10/22 1850  HGB 14.1  --   --   HCT 44.2  --   --   PLT 224  --   --   APTT  --  35  --   LABPROT  --  23.8*  --   INR  --  2.2*  --   CREATININE 1.08  --   --   TROPONINIHS 21*  --  47*    CrCl cannot be calculated (Unknown ideal weight.).   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   Atrial fibrillation (HCC)    Cervical spinal stenosis    with neuropathy   CHF (congestive heart failure) (HCC)    Constipation    COPD (chronic obstructive pulmonary disease) (HCC)    COPD with acute exacerbation ([place]) 10/13/2016   Coronary artery disease    Depression    Dyspnea    Dysrhythmia    atrial fibrillation   Emphysema of lung (HCC)    Factor 5 Leiden mutation, heterozygous ([place])    on coumadin   Factor V Leiden mutation ([place])    GERD (gastroesophageal reflux disease)    Hypertension    Lung nodule seen on imaging study    being followed by dr. [name]. just watching it for last few years, without change   Mitral valve insufficiency    Moderate tricuspid insufficiency    Myocardial infarction [facility]) 2004   stent placed, pacemaker implanted 2005   Osteomyelitis ([place]) 10/2019   left foot   Oxygen dependent    requires 2L nasal prong oxygen 24 hours a day   Peripheral vascular disease ([place])    Presence of permanent cardiac pacemaker [phone]   PVD (peripheral vascular  disease) ([place])    Sleep apnea    waiting to have sleep study. Used bipap while hospitalized and said it was great for him.    Medications:  Recent med hx shows pt taking Warfarin 4 mg daily, last dose unknown at this time pending med rec  Assessment: Pt is 66 yo male on warfarin at home for Factor 5 Leiden mutation, heterozygous. INR 2.2, therapeutic upon admission.   Goal of Therapy:  INR 2-3 Monitor platelets by anticoagulation protocol: Yes   Plan:  Place order to resume Warfarin 4 mg daily - 1800. Will recheck INR w/ AM labs Will check INR & CBC at least weekly once IRN confirmed to be consecutively therapeutic  Renda Rolls, PharmD, [facility] 03/11/2022 2:14 AM

## 2022-03-11 NOTE — ED Notes (Signed)
Pt transferred from recliner to bed.

## 2022-03-11 NOTE — Assessment & Plan Note (Signed)
No obstructive symptoms. Lab Results  Component Value Date   CREATININE 1.08 03/10/2022   CREATININE 0.71 03/15/2020   CREATININE 0.70 11/30/2019  Continue patient's Flomax.

## 2022-03-11 NOTE — Evaluation (Addendum)
Occupational Therapy Evaluation Patient Details Name: ELIOTT AMPARAN MRN: [id number] DOB: July 30, 1955 Today's Date: 03/11/2022   History of Present Illness JOSPEH MANGEL is a 66 y.o. 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   Clinical Impression   Patient seen for OT evaluation. Pt received sitting up in chair and agreeable to OT assessment. Pt reports that his wife provides assistance for ADLs/IADLs and completes lateral scoot transfers with use of L prosthesis at baseline. Patient currently functioning at set up A for grooming and UB dressing tasks. Education provided for review of desensitization strategies for management of residual limb. RN present at end of session to give breathing treatment. Pt near baseline level of function in ADLs. At this time, pt does not demonstrate any acute OT needs. Will complete orders.       Recommendations for follow up therapy are one component of a multi-disciplinary discharge planning process, led by the attending physician.  Recommendations may be updated based on patient status, additional functional criteria and insurance authorization.   Follow Up Recommendations  No OT follow up    Assistance Recommended at Discharge Frequent or constant Supervision/Assistance  Patient can return home with the following A little help with walking and/or transfers;Assistance with cooking/housework;Assist for transportation;Help with stairs or ramp for entrance;A lot of help with bathing/dressing/bathroom    Functional Status Assessment  Patient has not had a recent decline in their functional status  Equipment Recommendations  None recommended by OT    Recommendations for Other Services        Precautions / Restrictions Precautions Precautions: Fall Precaution Comments: R AKA and L BKA Restrictions Weight Bearing Restrictions: No      Mobility Bed Mobility               General bed mobility comments: received and left in recliner    Transfers                   General transfer comment: pt deferred, just transferred to chair with PT      Balance Overall balance assessment: Needs assistance Sitting-balance support: Bilateral upper extremity supported, Feet unsupported Sitting balance-Leahy Scale: Good       Standing balance-Leahy Scale: Zero Standing balance comment: does not stand at baseline due to BLE amputations                           ADL either performed or assessed with clinical judgement   ADL Overall ADL's : At baseline                                       General ADL Comments: Wife assists with LB ADLs at baseline. Pt demonstrated how he threads shorts over prosthetic limbs then pushes up on arm rests with BUE while wife pulls shorts over his hips. Able to don L prosthesis with set up A.     Vision Patient Visual Report: No change from baseline       Perception     Praxis      Pertinent Vitals/Pain Pain Assessment Pain Assessment: No/denies pain (reports occasional phantom  limb pain)     Hand Dominance Right   Extremity/Trunk Assessment Upper Extremity Assessment Upper Extremity Assessment: Overall WFL for tasks assessed   Lower Extremity Assessment Lower Extremity Assessment: Generalized weakness;RLE deficits/detail;LLE deficits/detail RLE Deficits / Details: R AKA LLE Deficits / Details: L BKA   Cervical / Trunk Assessment Cervical / Trunk Assessment: Normal   Communication Communication Communication: No difficulties   Cognition Arousal/Alertness: Awake/alert Behavior During Therapy: WFL for tasks assessed/performed Overall Cognitive Status: Within Functional Limits for  tasks assessed                                       General Comments  SpO2 >90% on 4L O2 via [place], SpO2 >90% on 4L O2 via [place], RR 30s, HR 80s, pulse 80s in sitting     Exercises Other Exercises Other Exercises: OT provided education re: role of OT, OT POC, post acute recs, sitting up for all meals, EOB/OOB mobility with assistance, home/fall safety, desensitization strategies for management of residual limb   Shoulder Instructions      Home Living Family/patient expects to be discharged to:: Private residence Living Arrangements: Spouse/significant other Available Help at Discharge: Family;Available 24 hours/day Type of Home: Mobile home Home Access: Ramped entrance     Home Layout: One level     Bathroom Shower/Tub: Walk-in shower;Sponge bathes at baseline   Bathroom Toilet: Handicapped height     Home Equipment: Wheelchair - Banker;Other (comment) (drop arm commode; slide board and L prosthesis used for transfers; oxygen (2L))          Prior Functioning/Environment Prior Level of Function : Needs assist       Physical Assist : ADLs (physical)   ADLs (physical): Grooming;Bathing;Dressing;Toileting;IADLs Mobility Comments: Lateral scoot transfers Mod I with L prosthesis, uses manual w/c or transport chair for mobility, denies history of falls ADLs Comments: Pt is independent with UB ADLs after set up, wife provides assistance for LB ADLs (dressing, bathing). Pt reports taking sponge baths for past ~2 years while sitting in transport chair at sink. Wife assists with all IADLs.        OT Problem List: Decreased strength;Decreased activity tolerance      OT Treatment/Interventions:      OT Goals(Current goals can be found in the care plan section) Acute Rehab OT Goals Patient Stated Goal: go home OT Goal Formulation: All assessment and education complete, DC therapy  OT Frequency:      Co-evaluation              AM-PAC OT "6  Clicks" Daily Activity     Outcome Measure Help from another person eating meals?: A Little (set up) Help from another person taking care of personal grooming?: A Little (set up) Help from another person toileting, which includes using toliet, bedpan, or urinal?: A Lot Help from another person bathing (including washing, rinsing, drying)?: A Lot Help from another person to put on and taking off regular upper body clothing?: A Little (set up) Help from another person to put on and taking off regular lower body clothing?: A Lot 6 Click Score: 15   End of Session Equipment Utilized During Treatment: Oxygen (4L O2 via [place]) Nurse Communication: Mobility status  Activity Tolerance: Patient tolerated treatment well Patient left: in chair;with nursing/sitter in room (RN present to give breathing treatment)  Time: 1587-2761 OT Time Calculation (min): 18 min Charges:  OT General Charges $OT Visit: 1 Visit OT Evaluation $OT Eval Moderate Complexity: 1 Mod  [facility] MS, OTR/L ascom [phone]  03/11/22, 1:57 PM

## 2022-03-11 NOTE — Assessment & Plan Note (Signed)
CPAP per home settings.   

## 2022-03-11 NOTE — Assessment & Plan Note (Signed)
Continue with antibiotic regimen Rocephin and doxycycline along with steroid taper.

## 2022-03-11 NOTE — Progress Notes (Signed)
[facility]                                                                              Yarden Hillis, is a 66 y.o. male, DOB - 07-10-1955, NAT:[id number] Admit date - 03/10/2022    Outpatient Primary MD for the patient is [name], MD  LOS - 1  days  Chief Complaint  Patient presents with   Shortness of Breath    C/o having difficulty breathing since yesterday, today it got worse, normally on 2L of O2        Brief summary   Patient is a 66 year old male with history of A-fib, COPD, CHF, factor V Leyden deficiency on Coumadin, PVD, OSA, chronic respiratory failure on 2 L O2 at baseline presented to ED with shortness of breath.  Patient was found to have mild to moderate distress and was placed on BiPAP in ED.  His symptoms started over past 2 days prior to admission with increasing cough, clear sputum and shortness of breath.  No orthopnea, leg swelling or DOE.    Assessment & Plan    Principal Problem:   Acute on chronic respiratory failure with hypoxia (HCC) secondary to COPD exacerbation -At baseline on 2 L O2 via [place] -Presented with acute respiratory distress, was placed on BiPAP.   -CTA chest negative for PE, showed advanced emphysematous changes of the lungs.  No pneumonia or pulmonary edema -Continue BiPAP, wean as tolerated per RT -Placed on scheduled DuoNebs, doxycycline, ceftriaxone, Pulmicort, Brovana -Continue IV Solu-Medrol 60 mg every 12 hours, flutter valve -Respiratory virus panel negative, COVID-19 negative  Active Problems:   Chronic systolic CHF (congestive heart failure), NYHA class 3 (HCC) -BNP 97.6, troponin 21-> 47 -Follow 2D echo    AF (paroxysmal atrial fibrillation) (HCC), CAD -Currently normal sinus rhythm -No acute chest pain, mild troponin elevation, follow 2D echo       AICD (automatic cardioverter/defibrillator) present -No acute issues    Sleep apnea -Currently on BiPAP    Essential hypertension, benign -BP  currently soft     BPH (benign prostatic hyperplasia) Continue Flomax   Code Status: Full code DVT Prophylaxis:   warfarin (COUMADIN) tablet 4 mg   Level of Care: Level of care: Progressive Family Communication: Updated patient, alert and oriented, no family at bedside   Disposition Plan:      Remains inpatient appropriate: Hopefully DC home in next 24 to 48 hours if wheezing improving   Procedures:  None  Consultants:   None  Antimicrobials:   Anti-infectives (From admission, onward)    Start     Dose/Rate Route Frequency Ordered Stop   03/11/22 1600  cefTRIAXone (ROCEPHIN) 1 g in sodium chloride 0.9 % 100 mL IVPB        1 g 200 mL/hr over 30 Minutes Intravenous Every 24 hours 03/10/22 2254 03/15/22 1559   03/11/22 0200  doxycycline (VIBRA-TABS) tablet 100 mg        100 mg Oral Every 12 hours 03/11/22 0145     03/10/22 1615  cefTRIAXone (ROCEPHIN) 1 g in sodium chloride 0.9 % 100 mL IVPB  1 g 200 mL/hr over 30 Minutes Intravenous  Once 03/10/22 1613 03/10/22 1730   03/10/22 1615  doxycycline (VIBRA-TABS) tablet 100 mg        100 mg Oral  Once 03/10/22 1613 03/10/22 1651          Medications  arformoterol  15 mcg Nebulization BID   budesonide  0.5 mg Nebulization BID   citalopram  20 mg Oral Daily   doxycycline  100 mg Oral Q12H   gabapentin  200 mg Oral TID   guaiFENesin  600 mg Oral QPM   ipratropium  2 spray Each Nare BID   ipratropium-albuterol  3 mL Nebulization Q4H   methylPREDNISolone (SOLU-MEDROL) injection  60 mg Intravenous Q12H   metoprolol succinate  25 mg Oral Daily   nicotine  14 mg Transdermal Daily   sacubitril-valsartan  1 tablet Oral BID   sodium chloride flush  3 mL Intravenous Q12H   warfarin  4 mg Oral ONCE-1600   Warfarin - Pharmacist Dosing Inpatient   Does not apply q1600      Subjective:   Juquan Reznick was seen and examined today.  Still wheezing bilaterally, shortness of breath.  BiPAP was taken off to eat  breakfast.  Denies any chest pain, abdominal pain, nausea vomiting.  No fevers   Objective:   Vitals:   03/11/22 1000 03/11/22 1128 03/11/22 1128 03/11/22 1149  BP: 122/72   135/84  Pulse: 78  81 90  Resp: 19  (!) 21 19  Temp:  98.4 F (36.9 C)    TempSrc:  Oral    SpO2:    97%    Intake/Output Summary (Last 24 hours) at 03/11/2022 1441 Last data filed at 03/10/2022 2104 Gross per 24 hour  Intake 500 ml  Output --  Net 500 ml     Wt Readings from Last 3 Encounters:  06/05/21 70.3 kg  02/05/21 68.9 kg  10/03/20 67.9 kg     Exam General: Alert and oriented x 3, NAD Cardiovascular: S1 S2 auscultated,  RRR Respiratory: Bilateral expiratory wheezing Gastrointestinal: Soft, nontender, nondistended, + bowel sounds Ext: no pedal edema bilaterally Neuro: no new deficits Psych: Normal affect and demeanor, alert and oriented x3     Data Reviewed:  I have personally reviewed following labs    CBC Lab Results  Component Value Date   WBC 17.5 (H) 03/11/2022   RBC 4.38 03/11/2022   HGB 13.0 03/11/2022   HCT 40.2 03/11/2022   MCV 91.8 03/11/2022   MCH 29.7 03/11/2022   PLT 245 03/11/2022   MCHC 32.3 03/11/2022   RDW 13.8 03/11/2022   LYMPHSABS 0.7 03/10/2022   MONOABS 1.4 (H) 03/10/2022   EOSABS 0.0 03/10/2022   BASOSABS 0.1 47/01/6282     Last metabolic panel Lab Results  Component Value Date   NA 134 (L) 03/10/2022   K 4.3 03/10/2022   CL 96 (L) 03/10/2022   CO2 24 03/10/2022   BUN 14 03/10/2022   CREATININE 0.95 03/10/2022   GLUCOSE 142 (H) 03/10/2022   GFRNONAA >60 03/10/2022   GFRAA >60 11/30/2019   CALCIUM 9.0 03/10/2022   PHOS 2.8 10/15/2016   PROT 5.8 (L) 03/11/2022   ALBUMIN 3.6 03/11/2022   BILITOT 0.6 03/11/2022   ALKPHOS 42 03/11/2022   AST 29 03/11/2022   ALT 24 03/11/2022   ANIONGAP 14 03/10/2022    CBG (last 3)  No results for input(s): "GLUCAP" in the last 72 hours.    Coagulation Profile: Recent  Labs  Lab 03/10/22 1702  03/11/22 0725  INR 2.2* 2.3*     Radiology Studies: I have personally reviewed the imaging studies  CT Angio Chest Pulmonary Embolism (PE) W or WO Contrast  Result Date: 03/10/2022 CLINICAL DATA:  Past medical history of COPD. Baseline oxygen requirement is 2 L. shortness of breath for couple of days. EXAM: CT ANGIOGRAPHY CHEST WITH CONTRAST TECHNIQUE: Multidetector CT imaging of the chest was performed using the standard protocol during bolus administration of intravenous contrast. Multiplanar CT image reconstructions and MIPs were obtained to evaluate the vascular anatomy. RADIATION DOSE REDUCTION: This exam was performed according to the departmental dose-optimization program which includes automated exposure control, adjustment of the mA and/or kV according to patient size and/or use of iterative reconstruction technique. CONTRAST:  3m OMNIPAQUE IOHEXOL 350 MG/ML SOLN COMPARISON:  Chest radiograph performed earlier on the same date FINDINGS: Cardiovascular: Satisfactory opacification of the pulmonary arteries to the segmental level. No evidence of pulmonary embolism. Normal heart size. No pericardial effusion. Pacemaker leads terminating in the ventricles. Aortic atherosclerotic calcifications. Mediastinum/Nodes: No enlarged mediastinal, hilar, or axillary lymph nodes. Thyroid gland, trachea, and esophagus demonstrate no significant findings. Lungs/Pleura: Advanced emphysematous changes of the lungs. No evidence of pneumonia or pulmonary edema. Mild bibasilar subsegmental atelectasis. Upper Abdomen: No acute abnormality. Musculoskeletal: No chest wall abnormality. No acute or significant osseous findings. Review of the MIP images confirms the above findings. IMPRESSION: 1. No CT evidence of pulmonary embolism. 2. Advanced emphysematous changes of the lungs. 3. No evidence of pneumonia or pulmonary edema. 4. Aortic Atherosclerosis (ICD10-I70.0) and Emphysema (ICD10-J43.9). Electronically Signed   By:  IKeane PoliceD.O.   On: 03/10/2022 21:24   DG Chest 2 View  Result Date: 03/10/2022 CLINICAL DATA:  Short of breath for couple days EXAM: CHEST - 2 VIEW COMPARISON:  11/26/2019 FINDINGS: LEFT-sided pacemaker overlies normal cardiac silhouette. Lungs are clear. No effusion, infiltrate pneumothorax. IMPRESSION: No acute cardiopulmonary process. Electronically Signed   By: SSuzy BouchardM.D.   On: 03/10/2022 16:28       Rudolph Dobler M.D. [facility] 03/11/2022, 2:41 PM  Available via Epic secure chat 7am-7pm After 7 pm, please refer to night coverage provider listed on amion.

## 2022-03-12 DIAGNOSIS — Z95810 Presence of automatic (implantable) cardiac defibrillator: Secondary | ICD-10-CM | POA: Diagnosis not present

## 2022-03-12 DIAGNOSIS — I480 Paroxysmal atrial fibrillation: Secondary | ICD-10-CM | POA: Diagnosis not present

## 2022-03-12 DIAGNOSIS — J9602 Acute respiratory failure with hypercapnia: Secondary | ICD-10-CM | POA: Diagnosis not present

## 2022-03-12 DIAGNOSIS — J9621 Acute and chronic respiratory failure with hypoxia: Secondary | ICD-10-CM | POA: Diagnosis not present

## 2022-03-12 LAB — URINE CULTURE: Culture: NO GROWTH

## 2022-03-12 LAB — PROTIME-INR
INR: 2.4 — ABNORMAL HIGH (ref 0.8–1.2)
Prothrombin Time: 25.5 seconds — ABNORMAL HIGH (ref 11.4–15.2)

## 2022-03-12 LAB — ECHOCARDIOGRAM COMPLETE: S' Lateral: 3.8 cm

## 2022-03-12 LAB — HIV ANTIBODY (ROUTINE TESTING W REFLEX): HIV Screen 4th Generation wRfx: NONREACTIVE

## 2022-03-12 LAB — TROPONIN I (HIGH SENSITIVITY): Troponin I (High Sensitivity): 31 ng/L — ABNORMAL HIGH (ref <18)

## 2022-03-12 MED ORDER — SIMETHICONE 80 MG PO CHEW
80.0000 mg | CHEWABLE_TABLET | Freq: Four times a day (QID) | ORAL | Status: DC | PRN
  Administered 2022-03-12: 80 mg via ORAL
  Filled 2022-03-12 (×2): qty 1

## 2022-03-12 MED ORDER — WARFARIN SODIUM 4 MG PO TABS
4.0000 mg | ORAL_TABLET | Freq: Every day | ORAL | Status: DC
  Administered 2022-03-12: 4 mg via ORAL
  Filled 2022-03-12 (×2): qty 1

## 2022-03-12 MED ORDER — IPRATROPIUM-ALBUTEROL 0.5-2.5 (3) MG/3ML IN SOLN
3.0000 mL | Freq: Two times a day (BID) | RESPIRATORY_TRACT | Status: DC
  Administered 2022-03-13: 3 mL via RESPIRATORY_TRACT
  Filled 2022-03-12: qty 3

## 2022-03-12 NOTE — Progress Notes (Signed)
[facility]                                                                              Geordan Xu, is a 66 y.o. male, DOB - Feb 10, 1956, DJS:[id number] Admit date - 03/10/2022    Outpatient Primary MD for the patient is [name], MD  LOS - 2  days  Chief Complaint  Patient presents with   Shortness of Breath    C/o having difficulty breathing since yesterday, today it got worse, normally on 2L of O2        Brief summary   Patient is a 66 year old male with history of A-fib, COPD, CHF, factor V Leyden deficiency on Coumadin, PVD, OSA, chronic respiratory failure on 2 L O2 at baseline presented to ED with shortness of breath.  Patient was found to have mild to moderate distress and was placed on BiPAP in ED.  His symptoms started over past 2 days prior to admission with increasing cough, clear sputum and shortness of breath.  No orthopnea, leg swelling or DOE.    Assessment & Plan    Principal Problem:   Acute on chronic respiratory failure with hypoxia (HCC) secondary to COPD exacerbation -At baseline on 2 L O2 via [place], presented with acute respiratory distress and was placed on BiPAP -CTA chest negative for PE, showed advanced emphysematous changes of the lungs.  No pneumonia or pulmonary edema -Off BiPAP now, currently on 4 L O2, wean O2 as tolerated -Continue scheduled DuoNebs, doxycycline, ceftriaxone, Pulmicort, Brovana -Continue IV Solu-Medrol 60 mg every 12 hours, flutter valve -Respiratory virus panel negative, COVID-19 negative -Home O2 eval prior to DC  Active Problems:   Chronic systolic CHF (congestive heart failure), NYHA class 3 (HCC) -BNP 97.6, troponin 21-> 47 -Follow 2D echo    AF (paroxysmal atrial fibrillation) (HCC), CAD -Currently normal sinus rhythm -No acute chest pain, mild troponin elevation, follow 2D echo       AICD (automatic cardioverter/defibrillator) present -No acute issues  OSA sleep apnea -Over night  placed on CPAP, per patient on at home on [facility]    Essential hypertension, benign -BP stable     BPH (benign prostatic hyperplasia) Continue Flomax   Code Status: Full code DVT Prophylaxis:   warfarin (COUMADIN) tablet 4 mg   Level of Care: Level of care: Progressive Family Communication: Updated patient, alert and oriented   Disposition Plan:      Remains inpatient appropriate: Hopefully DC home in next 24 to 48 hours if wheezing improving   Procedures:  None  Consultants:   None  Antimicrobials:   Anti-infectives (From admission, onward)    Start     Dose/Rate Route Frequency Ordered Stop   03/11/22 1600  cefTRIAXone (ROCEPHIN) 1 g in sodium chloride 0.9 % 100 mL IVPB        1 g 200 mL/hr over 30 Minutes Intravenous Every 24 hours 03/10/22 2254 03/15/22 1559   03/11/22 0200  doxycycline (VIBRA-TABS) tablet 100 mg        100 mg Oral Every 12 hours 03/11/22 0145     03/10/22 1615  cefTRIAXone (ROCEPHIN) 1 g  in sodium chloride 0.9 % 100 mL IVPB        1 g 200 mL/hr over 30 Minutes Intravenous  Once 03/10/22 1613 03/10/22 1730   03/10/22 1615  doxycycline (VIBRA-TABS) tablet 100 mg        100 mg Oral  Once 03/10/22 1613 03/10/22 1651          Medications  arformoterol  15 mcg Nebulization BID   budesonide  0.5 mg Nebulization BID   citalopram  20 mg Oral Daily   doxycycline  100 mg Oral Q12H   gabapentin  200 mg Oral TID   guaiFENesin  600 mg Oral QPM   ipratropium  2 spray Each Nare BID   ipratropium-albuterol  3 mL Nebulization Q4H   methylPREDNISolone (SOLU-MEDROL) injection  60 mg Intravenous Q12H   metoprolol succinate  25 mg Oral Daily   nicotine  14 mg Transdermal Daily   sacubitril-valsartan  1 tablet Oral BID   sodium chloride flush  3 mL Intravenous Q12H   warfarin  4 mg Oral q1600   Warfarin - Pharmacist Dosing Inpatient   Does not apply q1600      Subjective:   Ameet Sandy was seen and examined today.  Still wheezing although  improving.  Off the BiPAP.  Still on 4 L O2 via .  No chest pain, fevers or chills. Objective:   Vitals:   03/12/22 0830 03/12/22 1030 03/12/22 1200 03/12/22 1249  BP: 119/72  124/74   Pulse: 79 79 86   Resp: 16 (!) 25 20   Temp:   98 F (36.7 C)   TempSrc:   Oral   SpO2: 98% 99% 96%   Weight:    76 kg  Height:    '6\' 1"'$  (1.854 m)    Intake/Output Summary (Last 24 hours) at 03/12/2022 1342 Last data filed at 03/12/2022 0800 Gross per 24 hour  Intake --  Output 801 ml  Net -801 ml     Wt Readings from Last 3 Encounters:  03/12/22 76 kg  06/05/21 70.3 kg  02/05/21 68.9 kg   Physical Exam General: Alert and oriented x 3, NAD Cardiovascular: S1 S2 clear, RRR.  Respiratory: Bilateral expiratory wheezing although improving today Gastrointestinal: Soft, nontender, nondistended, NBS Ext: no pedal edema bilaterally Neuro: no new deficits Psych: Normal affect    Data Reviewed:  I have personally reviewed following labs    CBC Lab Results  Component Value Date   WBC 17.5 (H) 03/11/2022   RBC 4.38 03/11/2022   HGB 13.0 03/11/2022   HCT 40.2 03/11/2022   MCV 91.8 03/11/2022   MCH 29.7 03/11/2022   PLT 245 03/11/2022   MCHC 32.3 03/11/2022   RDW 13.8 03/11/2022   LYMPHSABS 0.7 03/10/2022   MONOABS 1.4 (H) 03/10/2022   EOSABS 0.0 03/10/2022   BASOSABS 0.1 03/18/8526     Last metabolic panel Lab Results  Component Value Date   NA 134 (L) 03/10/2022   K 4.3 03/10/2022   CL 96 (L) 03/10/2022   CO2 24 03/10/2022   BUN 14 03/10/2022   CREATININE 0.95 03/10/2022   GLUCOSE 142 (H) 03/10/2022   GFRNONAA >60 03/10/2022   GFRAA >60 11/30/2019   CALCIUM 9.0 03/10/2022   PHOS 2.8 10/15/2016   PROT 5.8 (L) 03/11/2022   ALBUMIN 3.6 03/11/2022   BILITOT 0.6 03/11/2022   ALKPHOS 42 03/11/2022   AST 29 03/11/2022   ALT 24 03/11/2022   ANIONGAP 14 03/10/2022    CBG (  last 3)  No results for input(s): "GLUCAP" in the last 72 hours.    Coagulation  Profile: Recent Labs  Lab 03/10/22 1702 03/11/22 0725 03/12/22 0439  INR 2.2* 2.3* 2.4*     Radiology Studies: I have personally reviewed the imaging studies  CT Angio Chest Pulmonary Embolism (PE) W or WO Contrast  Result Date: 03/10/2022 CLINICAL DATA:  Past medical history of COPD. Baseline oxygen requirement is 2 L. shortness of breath for couple of days. EXAM: CT ANGIOGRAPHY CHEST WITH CONTRAST TECHNIQUE: Multidetector CT imaging of the chest was performed using the standard protocol during bolus administration of intravenous contrast. Multiplanar CT image reconstructions and MIPs were obtained to evaluate the vascular anatomy. RADIATION DOSE REDUCTION: This exam was performed according to the departmental dose-optimization program which includes automated exposure control, adjustment of the mA and/or kV according to patient size and/or use of iterative reconstruction technique. CONTRAST:  70m OMNIPAQUE IOHEXOL 350 MG/ML SOLN COMPARISON:  Chest radiograph performed earlier on the same date FINDINGS: Cardiovascular: Satisfactory opacification of the pulmonary arteries to the segmental level. No evidence of pulmonary embolism. Normal heart size. No pericardial effusion. Pacemaker leads terminating in the ventricles. Aortic atherosclerotic calcifications. Mediastinum/Nodes: No enlarged mediastinal, hilar, or axillary lymph nodes. Thyroid gland, trachea, and esophagus demonstrate no significant findings. Lungs/Pleura: Advanced emphysematous changes of the lungs. No evidence of pneumonia or pulmonary edema. Mild bibasilar subsegmental atelectasis. Upper Abdomen: No acute abnormality. Musculoskeletal: No chest wall abnormality. No acute or significant osseous findings. Review of the MIP images confirms the above findings. IMPRESSION: 1. No CT evidence of pulmonary embolism. 2. Advanced emphysematous changes of the lungs. 3. No evidence of pneumonia or pulmonary edema. 4. Aortic Atherosclerosis  (ICD10-I70.0) and Emphysema (ICD10-J43.9). Electronically Signed   By: IKeane PoliceD.O.   On: 03/10/2022 21:24   DG Chest 2 View  Result Date: 03/10/2022 CLINICAL DATA:  Short of breath for couple days EXAM: CHEST - 2 VIEW COMPARISON:  11/26/2019 FINDINGS: LEFT-sided pacemaker overlies normal cardiac silhouette. Lungs are clear. No effusion, infiltrate pneumothorax. IMPRESSION: No acute cardiopulmonary process. Electronically Signed   By: SSuzy BouchardM.D.   On: 03/10/2022 16:28       Madysun Thall M.D. [facility] 03/12/2022, 1:42 PM  Available via Epic secure chat 7am-7pm After 7 pm, please refer to night coverage provider listed on amion.

## 2022-03-12 NOTE — ED Notes (Signed)
Pt transitioned from CPAP to 4L [place].

## 2022-03-12 NOTE — Progress Notes (Signed)
Tyler [name] for Warfarin Indication: h/o DVT ISO Factor 5 Leiden, heterozygous  Allergies  Allergen Reactions   Apixaban Rash   Rivaroxaban Rash    Vital Signs: Temp: 97.9 F (36.6 C) (10/19 0800) Temp Source: Oral (10/19 0800) BP: 128/72 (10/19 0800) Pulse Rate: 89 (10/19 0800)  Labs: Recent Labs    03/10/22 1504 03/10/22 1702 03/10/22 1850 03/11/22 0725 03/11/22 1019 03/12/22 0439  HGB 14.1  --   --   --  13.0  --   HCT 44.2  --   --   --  40.2  --   PLT 224  --   --   --  245  --   APTT  --  35  --   --   --   --   LABPROT  --  23.8*  --  24.7*  --  25.5*  INR  --  2.2*  --  2.3*  --  2.4*  CREATININE 1.08  --  0.95  --   --   --   TROPONINIHS 21*  --  47* 51*  --   --      CrCl cannot be calculated (Unknown ideal weight.).   Medical History: Past Medical History:  Diagnosis Date   AICD (automatic cardioverter/defibrillator) present    Anxiety    Arthritis    Atherosclerosis of artery of extremity with ulceration ([place]) 09/2019   left foot s/p toe amp requiring debridement and futher toe amputations.   Atrial fibrillation (HCC)    Cervical spinal stenosis    with neuropathy   CHF (congestive heart failure) (HCC)    Constipation    COPD (chronic obstructive pulmonary disease) (HCC)    COPD with acute exacerbation ([place]) 10/13/2016   Coronary artery disease    Depression    Dyspnea    Dysrhythmia    atrial fibrillation   Emphysema of lung (HCC)    Factor 5 Leiden mutation, heterozygous ([place])    on coumadin   Factor V Leiden mutation ([place])    GERD (gastroesophageal reflux disease)    Hypertension    Lung nodule seen on imaging study    being followed by dr. [name]. just watching it for last few years, without change   Mitral valve insufficiency    Moderate tricuspid insufficiency    Myocardial infarction [facility]) 2004   stent placed, pacemaker implanted 2005   Osteomyelitis ([place]) 10/2019   left foot   Oxygen  dependent    requires 2L nasal prong oxygen 24 hours a day   Peripheral vascular disease ([place])    Presence of permanent cardiac pacemaker [phone]   PVD (peripheral vascular disease) ([place])    Sleep apnea    waiting to have sleep study. Used bipap while hospitalized and said it was great for him.   Medications:  After speaking to patient, confirmed that home dose is warfarin 4 mg daily  Assessment: Pt is 66 yo M with PMH Factor V leiden mutation (on warfarin), Afib, AICD, cervical spinal stenosis with neuropathy, VHD, COPD, emphysema, CAD, depression, HTN, GERD, PVD, OSA. Pt has a history of DVT and PE in the setting of Factor V leiden mutation and is on warfarin chronically at home. Re: Afib, pt's CHADSVASc score is 6. Pt presented hypotensive (was almost admitted to CCU) with worsening cough, SOB, sputum production. Imaging was negative for PE and no evidence of clots at this point. Pt currently VSS, slightly tachypneic, on 4 L [place].  INRs have been therapeutic x 3 since admission, so it is appropriate to place an ongoing order for warfarin 4 mg daily in accordance with patient's home regimen.  Date INR Warfarin Dose 10/18 2.3 4 mg 10/19 2.4   Baseline Labs: aPTT - 35; INR - 2.2 Hgb - 14.1; Plts - 224  Goal of Therapy:  INR 2-3 Monitor platelets by anticoagulation protocol: Yes   Plan:  Give warfarin 4 mg x 1 today in accordance with home regimen Now that INR has been therapeutic x 3, will place ongoing order for warfarin 4 mg daily Recheck INR w/ AM labs Monitor CBC at least weekly for patients taking warfarin  Dara Hoyer, PharmD PGY-1 Pharmacy Resident 03/12/2022 10:25 AM

## 2022-03-13 DIAGNOSIS — J9621 Acute and chronic respiratory failure with hypoxia: Secondary | ICD-10-CM | POA: Diagnosis not present

## 2022-03-13 DIAGNOSIS — J9602 Acute respiratory failure with hypercapnia: Secondary | ICD-10-CM | POA: Diagnosis not present

## 2022-03-13 DIAGNOSIS — Z95810 Presence of automatic (implantable) cardiac defibrillator: Secondary | ICD-10-CM | POA: Diagnosis not present

## 2022-03-13 DIAGNOSIS — I480 Paroxysmal atrial fibrillation: Secondary | ICD-10-CM | POA: Diagnosis not present

## 2022-03-13 LAB — PROTIME-INR
INR: 2.4 — ABNORMAL HIGH (ref 0.8–1.2)
Prothrombin Time: 26.3 seconds — ABNORMAL HIGH (ref 11.4–15.2)

## 2022-03-13 MED ORDER — GUAIFENESIN ER 600 MG PO TB12: 600.0000 mg | ORAL_TABLET | Freq: Every evening | ORAL | 0 refills | Status: DC

## 2022-03-13 MED ORDER — PREDNISONE 50 MG PO TABS
60.0000 mg | ORAL_TABLET | Freq: Once | ORAL | Status: AC
  Administered 2022-03-13: 60 mg via ORAL
  Filled 2022-03-13: qty 1

## 2022-03-13 MED ORDER — IPRATROPIUM-ALBUTEROL 0.5-2.5 (3) MG/3ML IN SOLN: 3.0000 mL | Freq: Four times a day (QID) | RESPIRATORY_TRACT | 2 refills | Status: DC

## 2022-03-13 MED ORDER — PREDNISONE 10 MG PO TABS: ORAL_TABLET | ORAL | 2 refills | Status: DC

## 2022-03-13 MED ORDER — AMOXICILLIN-POT CLAVULANATE 875-125 MG PO TABS: 1.0000 | ORAL_TABLET | Freq: Two times a day (BID) | ORAL | 0 refills | Status: AC

## 2022-03-13 MED ORDER — BENZONATATE 100 MG PO CAPS: 100.0000 mg | ORAL_CAPSULE | Freq: Three times a day (TID) | ORAL | 0 refills | Status: AC | PRN

## 2022-03-13 MED ORDER — FUROSEMIDE 20 MG PO TABS: 20.0000 mg | ORAL_TABLET | Freq: Every day | ORAL | 3 refills | Status: DC

## 2022-03-13 MED ORDER — BUDESONIDE 0.5 MG/2ML IN SUSP: 0.5000 mg | Freq: Two times a day (BID) | RESPIRATORY_TRACT | 12 refills | Status: DC

## 2022-03-13 NOTE — Progress Notes (Signed)
Initial Nutrition Assessment  DOCUMENTATION CODES:   Non-severe (moderate) malnutrition in context of chronic illness  INTERVENTION:   -Liberalize diet to 2 gram sodium for wider variety of meal selections -Ensure Enlive po BID, each supplement provides 350 kcal and 20 grams of protein -MVI with minerals daily  NUTRITION DIAGNOSIS:   Moderate Malnutrition related to chronic illness (CHF) as evidenced by mild fat depletion, moderate fat depletion, mild muscle depletion, moderate muscle depletion.  GOAL:   Patient will meet greater than or equal to 90% of their needs  MONITOR:   PO intake, Supplement acceptance  REASON FOR ASSESSMENT:   Consult Assessment of nutrition requirement/status  ASSESSMENT:   Pt with history of A-fib, COPD, CHF, factor V Leyden deficiency on Coumadin, PVD, OSA, chronic respiratory failure on 2 L O2 at baseline presented with shortness of breath.  Pt admitted with shortness of breath, respiratory failure, and CHF.   Reviewed I/O's: -520 ml x 24 hours and -820 ml since admission  UOP: 1.1 L x 24 hours  Spoke with pt at bedside, who was pleasant and in good spirits today. He reports having a decreased appetite for abour 4-5 days PTA, due to not feeling like eating. Pt shares that he was only able to eat about 4-5 bites of meals during this time and explains that he feels bloated. Prior to acute illness, pt with good appetite; he generally consumes 2 meals per day (Breakfast: bacon, eggs, sausage, and toast; Dinner: meat, starch, and vegetable or fast food). Pt's wife prepares meals about 50% of the time and they eat out the rest of the time. Pt admits to eating a lot of high sodium foods, such as burgers and chick fil A.   Pt suspects he has lost weight, but suspects it is not accurate due to bloating. Pt admits that he has noted a lot of muscle loss since 2021, when he had his last amputation. Per pt, he has been weaker transferring himself to his  wheelchair (pt has one prosthetic leg that he uses for transfers). Reviewed wt hx; no wt loss noted over the past 9 months.   Discussed importance of good meal and supplement intake to promote healing. Pt shares that he drank Boost supplements in the past, but they gave him indigestion, but is willing to try them agin.   Medications reviewed and include prednisone.   Labs reviewed: CBBS: 88 (inpatient orders for glycemic control are ).    NUTRITION - FOCUSED PHYSICAL EXAM:  Flowsheet Row Most Recent Value  Orbital Region Mild depletion  Upper Arm Region Mild depletion  Thoracic and Lumbar Region Mild depletion  Buccal Region Moderate depletion  Temple Region Moderate depletion  Clavicle Bone Region Moderate depletion  Clavicle and Acromion Bone Region Moderate depletion  Scapular Bone Region Moderate depletion  Dorsal Hand Mild depletion  Patellar Region Unable to assess  Anterior Thigh Region Mild depletion  Posterior Calf Region Unable to assess  Edema (RD Assessment) None  Hair Reviewed  Eyes Reviewed  Mouth Reviewed  Skin Reviewed  Nails Reviewed       Diet Order:   Diet Order             Diet - low sodium heart healthy           Diet heart healthy/carb modified Room service appropriate? Yes; Fluid consistency: Thin  Diet effective now                   EDUCATION  NEEDS:   Education needs have been addressed  Skin:  Skin Assessment: Reviewed RN Assessment  Last BM:  Unknown  Height:   Ht Readings from Last 1 Encounters:  03/12/22 '6\' 1"'$  (1.854 m)    Weight:   Wt Readings from Last 1 Encounters:  03/12/22 76 kg    Ideal Body Weight:  71.5 kg (adjusted for rt AKA and lt BKA)  BMI:  Body mass index is 22.11 kg/m.  Estimated Nutritional Needs:   Kcal:  2150-2350  Protein:  105-120 grams  Fluid:  >2 L    Loistine Chance, RD, LDN, [place] Registered Dietitian II Certified Diabetes Care and Education Specialist Please refer to [facility] for RD  and/or RD on-call/weekend/after hours pager

## 2022-03-13 NOTE — TOC CM/SW Note (Addendum)
CSW acknowledges consult for home health/DME needs. PT and OT have evaluated patient and determined he has no follow up/DME needs. Patient is on 3 L of oxygen right now. On 2 L at home per chart review. Will continue to follow.  Dayton Scrape, [place]  10:33 am: Patient has orders to discharge home today. Chart reviewed. No TOC needs identified. CSW signing off.  Dayton Scrape, [place]

## 2022-03-13 NOTE — Discharge Summary (Addendum)
Physician Discharge Summary   Patient: Tyler [name] MRN: [id number] DOB: [birth year]  Admit date:     03/10/2022  Discharge date: 03/13/22  Discharge Physician: Estill Cotta, MD   PCP: [name], MD   Recommendations at discharge:   Placed on prednisone taper 40 mgx 3 days, 30 mgx 3 days, 20 mgx 3 days, then continue 10 mg daily until follow-up with his pulmonologist.  Patient reports that he is on maintenance dose of prednisone 10 mg daily Continue Augmentin 1 tab p.o. 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  Patient was found to have mild to moderate distress and was placed on BiPAP in ED.  His symptoms started over past 2 days prior to admission with increasing cough, clear sputum and shortness of breath.  No orthopnea, leg swelling or DOE.     Assessment and Plan:   Acute on chronic respiratory failure with hypoxia (HCC) secondary to COPD exacerbation -At baseline on 2 L O2 via [place], presented with acute respiratory distress and was placed on BiPAP -CTA chest negative for PE, showed advanced emphysematous changes of the lungs.  No pneumonia or pulmonary edema -Patient has been off BiPAP for 48 hours, on 3 L O2, doing well, no wheezing - was placed on scheduled DuoNebs, pulmicort, Brovana, doxycycline,  ceftriaxone, IV steroids, flutter valve -Respiratory virus panel negative, COVID-19 negative -Transition to oral prednisone with taper, Augmentin for 7 days.  Recommended strongly to follow-up with his pulmonologist,, Dr Luvenia Heller in 7-10days       Chronic systolic CHF (congestive heart failure), NYHA class 3 (HCC) -BNP 97.6, troponin 21-> 47-> 31 -2D echo showed EF of 45 to 50%, left ventricular cavity size severely dilated, mildly reduced RV SF -Patient follows cardiology, recommended to follow-up with Dr. Nehemiah Massed within next 2 weeks -Continue Lasix 20 mg daily, Entresto 24-26 twice daily, beta-blocker, Jardiance -Also followed by [place] palliative outpatient   Patient  AF (paroxysmal atrial fibrillation) ([place]), CAD -Currently normal sinus rhythm -Continue beta-blocker, on Coumadin -INR therapeutic, 2.4      AICD (automatic cardioverter/defibrillator) present -No acute issues   OSA sleep apnea -Over night placed on CPAP, per patient on at home on trilogy     Essential hypertension, benign -BP stable       BPH (benign prostatic hyperplasia) Continue Flomax  Factor V Leyden deficiency -Continue Coumadin  Bilateral BKA, debility -Outpatient follow-up with palliative     Pain control - [place] Controlled Substance Reporting System database was reviewed. and patient was instructed, not to drive, operate heavy machinery, perform activities at heights, swimming or participation in water activities or provide baby-sitting services while on Pain, Sleep and Anxiety Medications; until their outpatient Physician has advised to do so again. Also recommended to not to take more than prescribed Pain, Sleep and Anxiety Medications.  Consultants: None Procedures performed: 2D echo, Disposition: Home Diet recommendation:  Discharge Diet Orders (From admission, onward)     Start     Ordered   03/13/22 0000  Diet - low sodium heart healthy        03/13/22 1027            Cardiac diet DISCHARGE MEDICATION: Allergies as of 03/13/2022       Reactions   Apixaban Rash   Rivaroxaban Rash        Medication List     TAKE these medications    acetaminophen 500 MG tablet Commonly known as: TYLENOL Take 1,000 mg by mouth daily as needed for moderate pain or headache.   ALPRAZolam 1 MG tablet Commonly known as: XANAX Take 1 mg by mouth 2 (two) times daily as needed for anxiety or sleep.   amoxicillin-clavulanate 875-125 MG tablet Commonly known as: AUGMENTIN Take 1 tablet by mouth 2 (two) times daily for 7 days.   aspirin EC 81 MG tablet Take 1 tablet (81 mg total) by mouth daily. What changed: when to take this   benzonatate 100 MG capsule Commonly known as: Tessalon Perles Take 1 capsule (100 mg total) by mouth 3 (three) times daily as needed for cough.   budesonide 0.5 MG/2ML nebulizer solution Commonly known as: PULMICORT Take 2 mLs (0.5 mg total) by nebulization 2 (two) times daily. What changed:  when to take this Another medication with the same name was removed. Continue taking this medication, and follow the directions you see here.   citalopram 20 MG tablet Commonly known as: CELEXA Take 20 mg by mouth daily.   cyanocobalamin 1000 MCG tablet Commonly known as: VITAMIN B12 Take 1,000 mcg by mouth daily.   docusate sodium 100 MG capsule Commonly known as: COLACE Take 100 mg by mouth daily as needed (for constipation.).   Dupixent 300 MG/2ML Sopn Generic drug: Dupilumab Inject 300 mg into the skin every 14 (fourteen) days. Inject 2 mLs (372m).   Entresto 24-26 MG Generic drug: sacubitril-valsartan Take 1 tablet by mouth 2 (two) times daily.   EPINEPHrine 0.3 mg/0.3 mL Soaj injection Commonly known as: EPI-PEN Inject into the muscle.   fluticasone-salmeterol 115-21 MCG/ACT inhaler Commonly known as: Advair HFA USE 2 INHALATIONS ORALLY EVERY 12 HOURS What changed:  how much to take how to take this when to take  this additional instructions   Flutter Devi Use as directed.   furosemide 20 MG tablet Commonly known as: LASIX Take 1 tablet (20 mg total) by mouth daily.   gabapentin 100 MG capsule Commonly known as: NEURONTIN Take 300 mg by mouth in the morning, at noon, and at bedtime.   guaiFENesin 600 MG 12 hr tablet Commonly known as: MUCINEX Take 1 tablet (600 mg total) by mouth every evening.   ipratropium 0.03 % nasal spray Commonly known as: ATROVENT Place 2 sprays into both nostrils 2 (two) times daily.   ipratropium 0.06 % nasal spray Commonly known as: ATROVENT Place 1-2 sprays into both nostrils 3 (three) times daily as needed.   Ipratropium-Albuterol 20-100 MCG/ACT Aers respimat Commonly known as: COMBIVENT Inhale 1 puff into the lungs every 4 (four) hours.   ipratropium-albuterol 0.5-2.5 (3) MG/3ML Soln Commonly known as: DUONEB Take 3 mLs by nebulization 4 (four) times daily.   Jardiance 10 MG Tabs tablet Generic drug: empagliflozin Take 10 mg by mouth daily with breakfast.   lovastatin 20 MG tablet Commonly known as: MEVACOR Take 1 tablet by  mouth daily.   metoprolol succinate 25 MG 24 hr tablet Commonly known as: TOPROL-XL Take 25 mg by mouth daily.   morphine 15 MG 12 hr tablet Commonly known as: MS CONTIN Take 15 mg by mouth daily as needed.   naloxone 4 MG/0.1ML Liqd nasal spray kit Commonly known as: NARCAN SMARTSIG:Both Nares   nitroGLYCERIN 0.4 MG SL tablet Commonly known as: NITROSTAT Place 0.4 mg under the tongue every 5 (five) minutes as needed for chest pain.   oxyCODONE-acetaminophen 7.5-325 MG tablet Commonly known as: PERCOCET Take 1-2 tablets by mouth every 6 (six) hours as needed for moderate pain.   pantoprazole 40 MG tablet Commonly known as: PROTONIX Take 40 mg by mouth daily before breakfast.   predniSONE 10 MG tablet Commonly known as: DELTASONE Prednisone dosing: Take  Prednisone 61m (4 tabs) x 3 days, then taper to 359m(3  tabs) x 3 days, then 2075m2 tabs) x 3days, then continue 62m57mily What changed:  medication strength how much to take how to take this when to take this additional instructions   sildenafil 25 MG tablet Commonly known as: VIAGRA Take by mouth.   warfarin 4 MG tablet Commonly known as: COUMADIN Take 4 mg by mouth daily.        Follow-up Information     JohnBaxter Hire. Go in 2 week(s).   Specialty: Internal Medicine Why: Appointment on Tuesday, 03/17/2022 at 10:45am. Contact information: [phone]         AlesOttie Glazier. Go in 10 day(s).   Specialty: Pulmonary Disease Why: Appointment on Wednesday, 03/25/2022 at 3:15pm. Contact information: [phone]         KowaCorey Skains. Go in 2 week(s).   Specialty: Cardiology Why: Appointment on Tuesday, 04/21/2022 at 9:30am. You have been added to the cancellation list for an earlier appointment. Please call often to check for earlier appointments. Contact information: [id number] [phone]            Discharge Exam: FileDanley Dankerghts   03/12/22 1249  Weight: 76 kg   S: Wheezing much improved, still has cough but improving.  No fevers or chills.  Overall closer to baseline.  Vitals:   03/12/22 2346 03/13/22 0335 03/13/22 0700 03/13/22 1138  BP: 118/73 122/72  117/64  Pulse: 77 77  82  Resp: '18 20  17  ' Temp: 97.7 F (36.5 C) 97.6 F (36.4 C)  98.1 F (36.7 C)  TempSrc: Oral Axillary    SpO2: 98% 99% 97% 97%  Weight:      Height:        Physical Exam General: Alert and oriented x 3, NAD Cardiovascular: S1 S2 clear, RRR.  Respiratory: CTAB, no wheezing, rales Gastrointestinal: Soft, nontender, nondistended, NBS Ext: b/l BKA Psych: Normal affect and demeanor, alert and oriented x3    Condition at discharge:  fair  The results of significant diagnostics from this hospitalization (including imaging, microbiology, ancillary and laboratory) are listed below for reference.   Imaging Studies: ECHOCARDIOGRAM COMPLETE  Result Date: 03/12/2022    ECHOCARDIOGRAM REPORT   Patient Name:   HAROJAIDEN DINKINSe of Exam: 03/11/2022 Medical Rec #:  [id number]     Height:       73.0 in Accession #:    [id number]  Weight:       155.0 lb Date of Birth:  [birth year]          BSA:          1.931 m Patient Age:    42 years          BP:           117/68 mmHg Patient Gender: M                 HR:           84 bpm. Exam Location:  [facility] Procedure: 2D Echo, Cardiac Doppler and Color Doppler Indications:     R06.00 Dyspnea  History:         Patient has prior history of Echocardiogram examinations, most                  recent 10/25/2019. CHF, CAD and Previous Myocardial Infarction,                  Defibrillator, COPD, Arrythmias:Atrial Fibrillation; Risk                  Factors:Hypertension and Sleep Apnea. Emphysema of lung.                  Peripheral vascular disease.  Sonographer:     Wilford Sports Rodgers-Jones RDCS Referring Phys:  [id number] Genessa Beman K Cosimo Schertzer Diagnosing Phys: Yolonda Kida MD IMPRESSIONS  1. Left ventricular ejection fraction, by estimation, is 45 to 50%. The left ventricle has mildly decreased function. The left ventricle demonstrates regional wall motion abnormalities (see scoring diagram/findings for description). The left ventricular  internal cavity size was severely dilated. Left ventricular diastolic function could not be evaluated.  2. Right ventricular systolic function is mildly reduced. The right ventricular size is mildly enlarged. Mildly increased right ventricular wall thickness.  3. Left atrial size was severely dilated.  4. Right atrial size was mild to moderately dilated.  5. The mitral valve is grossly normal. Moderate mitral valve regurgitation.  6. The aortic valve is normal in structure. Aortic  valve regurgitation is not visualized. FINDINGS  Left Ventricle: Left ventricular ejection fraction, by estimation, is 45 to 50%. The left ventricle has mildly decreased function. The left ventricle demonstrates regional wall motion abnormalities. The left ventricular internal cavity size was severely  dilated. There is no left ventricular hypertrophy. Abnormal (paradoxical) septal motion, consistent with left bundle branch block. Left ventricular diastolic function could not be evaluated. Right Ventricle: The right ventricular size is mildly enlarged. Mildly increased right ventricular wall thickness. Right ventricular systolic function is mildly reduced. Left Atrium: Left atrial size was severely dilated. Right Atrium: Right atrial size was mild to moderately dilated. Pericardium: There is no evidence of pericardial effusion. Mitral Valve: The mitral valve is grossly normal. Moderate mitral valve regurgitation. Tricuspid Valve: The tricuspid valve is normal in structure. Tricuspid valve regurgitation is mild. Aortic Valve: The aortic valve is normal in structure. Aortic valve regurgitation is not visualized. Pulmonic Valve: The pulmonic valve was normal in structure. Pulmonic valve regurgitation is not visualized. Aorta: The ascending aorta was not well visualized. IAS/Shunts: No atrial level shunt detected by color flow Doppler. Additional Comments: A device lead is visualized.  LEFT VENTRICLE PLAX 2D LVIDd:         6.50 cm LVIDs:         3.80 cm LV PW:         1.00 cm LV IVS:  0.60 cm LVOT diam:     2.30 cm LV SV:         48 LV SV Index:   25 LVOT Area:     4.15 cm  RIGHT VENTRICLE            IVC RV Basal diam:  3.20 cm    IVC diam: 2.30 cm RV S prime:     9.28 cm/s TAPSE (M-mode): 1.3 cm LEFT ATRIUM              Index        RIGHT ATRIUM           Index LA diam:        6.10 cm  3.16 cm/m   RA Area:     16.10 cm LA Vol (A2C):   108.0 ml 55.94 ml/m  RA Volume:   41.20 ml  21.34 ml/m LA Vol (A4C):    82.8 ml  42.89 ml/m LA Biplane Vol: 102.0 ml 52.83 ml/m  AORTIC VALVE LVOT Vmax:   64.95 cm/s LVOT Vmean:  40.050 cm/s LVOT VTI:    0.115 m  AORTA Ao Root diam: 3.80 cm MV E velocity: 120.33 cm/s                             SHUNTS                             Systemic VTI:  0.12 m                             Systemic Diam: 2.30 cm Yolonda Kida MD Electronically signed by Yolonda Kida MD Signature Date/Time: 03/12/2022/5:20:06 PM    Final    CT Angio Chest Pulmonary Embolism (PE) W or WO Contrast  Result Date: 03/10/2022 CLINICAL DATA:  Past medical history of COPD. Baseline oxygen requirement is 2 L. shortness of breath for couple of days. EXAM: CT ANGIOGRAPHY CHEST WITH CONTRAST TECHNIQUE: Multidetector CT imaging of the chest was performed using the standard protocol during bolus administration of intravenous contrast. Multiplanar CT image reconstructions and MIPs were obtained to evaluate the vascular anatomy. RADIATION DOSE REDUCTION: This exam was performed according to the departmental dose-optimization program which includes automated exposure control, adjustment of the mA and/or kV according to patient size and/or use of iterative reconstruction technique. CONTRAST:  7m OMNIPAQUE IOHEXOL 350 MG/ML SOLN COMPARISON:  Chest radiograph performed earlier on the same date FINDINGS: Cardiovascular: Satisfactory opacification of the pulmonary arteries to the segmental level. No evidence of pulmonary embolism. Normal heart size. No pericardial effusion. Pacemaker leads terminating in the ventricles. Aortic atherosclerotic calcifications. Mediastinum/Nodes: No enlarged mediastinal, hilar, or axillary lymph nodes. Thyroid gland, trachea, and esophagus demonstrate no significant findings. Lungs/Pleura: Advanced emphysematous changes of the lungs. No evidence of pneumonia or pulmonary edema. Mild bibasilar subsegmental atelectasis. Upper Abdomen: No acute abnormality. Musculoskeletal: No chest wall  abnormality. No acute or significant osseous findings. Review of the MIP images confirms the above findings. IMPRESSION: 1. No CT evidence of pulmonary embolism. 2. Advanced emphysematous changes of the lungs. 3. No evidence of pneumonia or pulmonary edema. 4. Aortic Atherosclerosis (ICD10-I70.0) and Emphysema (ICD10-J43.9). Electronically Signed   By: IKeane PoliceD.O.   On: 03/10/2022 21:24   DG Chest 2 View  Result Date: 03/10/2022 CLINICAL DATA:  Short of  breath for couple days EXAM: CHEST - 2 VIEW COMPARISON:  11/26/2019 FINDINGS: LEFT-sided pacemaker overlies normal cardiac silhouette. Lungs are clear. No effusion, infiltrate pneumothorax. IMPRESSION: No acute cardiopulmonary process. Electronically Signed   By: Suzy Bouchard M.D.   On: 03/10/2022 16:28    Microbiology: Results for orders placed or performed during the hospital encounter of 03/10/22  SARS Coronavirus 2 by RT PCR (hospital order, performed in [facility] hospital lab) *cepheid single result test* Anterior Nasal Swab     Status: None   Collection Time: 03/10/22  2:57 PM   Specimen: Anterior Nasal Swab  Result Value Ref Range Status   SARS Coronavirus 2 by RT PCR NEGATIVE NEGATIVE Final    Comment: (NOTE) SARS-CoV-2 target nucleic acids are NOT DETECTED.  The SARS-CoV-2 RNA is generally detectable in upper and lower respiratory specimens during the acute phase of infection. The lowest concentration of SARS-CoV-2 viral copies this assay can detect is 250 copies / mL. A negative result does not preclude SARS-CoV-2 infection and should not be used as the sole basis for treatment or other patient management decisions.  A negative result may occur with improper specimen collection / handling, submission of specimen other than nasopharyngeal swab, presence of viral mutation(s) within the areas targeted by this assay, and inadequate number of viral copies (<250 copies / mL). A negative result must be combined with  clinical observations, patient history, and epidemiological information.  Fact Sheet for Patients:   https://www.patel.info/  Fact Sheet for Healthcare Providers: https://hall.com/  This test is not yet approved or  cleared by the [place] FDA and has been authorized for detection and/or diagnosis of SARS-CoV-2 by FDA under an Emergency Use Authorization (EUA).  This EUA will remain in effect (meaning this test can be used) for the duration of the COVID-19 declaration under Section 564(b)(1) of the Act, 21 U.S.C. section 360bbb-3(b)(1), unless the authorization is terminated or revoked sooner.  Performed at [facility], [address] [place], [place] [address]   Blood Culture (routine x 2)     Status: None (Preliminary result)   Collection Time: 03/10/22  5:02 PM   Specimen: BLOOD  Result Value Ref Range Status   Specimen Description BLOOD BLOOD RIGHT FOREARM  Final   Special Requests   Final    BOTTLES DRAWN AEROBIC AND ANAEROBIC Blood Culture results may not be optimal due to an excessive volume of blood received in culture bottles   Culture   Final    NO GROWTH 3 DAYS Performed at [facility], [address] [place], [place] [address]    Report Status PENDING  Incomplete  Respiratory (~20 pathogens) panel by PCR     Status: None   Collection Time: 03/11/22  7:25 AM   Specimen: Nasopharyngeal Swab; Respiratory  Result Value Ref Range Status   Adenovirus NOT DETECTED NOT DETECTED Final   Coronavirus 229E NOT DETECTED NOT DETECTED Final    Comment: (NOTE) The Coronavirus on the Respiratory Panel, DOES NOT test for the novel  Coronavirus (2019 nCoV)    Coronavirus HKU1 NOT DETECTED NOT DETECTED Final   Coronavirus NL63 NOT DETECTED NOT DETECTED Final   Coronavirus OC43 NOT DETECTED NOT DETECTED Final   Metapneumovirus NOT DETECTED NOT DETECTED Final   Rhinovirus / Enterovirus NOT DETECTED NOT DETECTED  Final   Influenza A NOT DETECTED NOT DETECTED Final   Influenza B NOT DETECTED NOT DETECTED Final   Parainfluenza Virus 1 NOT DETECTED NOT DETECTED Final  Parainfluenza Virus 2 NOT DETECTED NOT DETECTED Final   Parainfluenza Virus 3 NOT DETECTED NOT DETECTED Final   Parainfluenza Virus 4 NOT DETECTED NOT DETECTED Final   Respiratory Syncytial Virus NOT DETECTED NOT DETECTED Final   Bordetella pertussis NOT DETECTED NOT DETECTED Final   Bordetella Parapertussis NOT DETECTED NOT DETECTED Final   Chlamydophila pneumoniae NOT DETECTED NOT DETECTED Final   Mycoplasma pneumoniae NOT DETECTED NOT DETECTED Final    Comment: Performed at [facility], [place] [address], [place], [place] [address]  Blood Culture (routine x 2)     Status: None (Preliminary result)   Collection Time: 03/11/22 10:19 AM   Specimen: BLOOD  Result Value Ref Range Status   Specimen Description BLOOD LEFT ARM  Final   Special Requests   Final    BOTTLES DRAWN AEROBIC AND ANAEROBIC Blood Culture adequate volume   Culture   Final    NO GROWTH 2 DAYS Performed at [facility], [address], [place], [place] [address]    Report Status PENDING  Incomplete  Urine Culture     Status: None   Collection Time: 03/11/22 12:21 PM   Specimen: Urine, Random  Result Value Ref Range Status   Specimen Description   Final    URINE, RANDOM Performed at [facility], [address], [place], [place] [address]    Special Requests   Final    NONE Performed at [facility], [address] [place], [place] [address]    Culture   Final    NO GROWTH Performed at [facility], [place] [address] [place], [place] [address]    Report Status 03/12/2022 FINAL  Final    Labs: CBC: Recent Labs  Lab 03/10/22 1504 03/11/22 1019  WBC 20.1* 17.5*  NEUTROABS 17.8*  --   HGB 14.1 13.0  HCT 44.2 40.2  MCV 93.6 91.8  PLT 224 681   Basic Metabolic Panel: Recent Labs  Lab 03/10/22 1504  03/10/22 1850  NA 134* 134*  K 3.8 4.3  CL 97* 96*  CO2 28 24  GLUCOSE 121* 142*  BUN 14 14  CREATININE 1.08 0.95  CALCIUM 8.9 9.0   Liver Function Tests: Recent Labs  Lab 03/10/22 1850 03/11/22 0725  AST 31 29  ALT 24 24  ALKPHOS 51 42  BILITOT 1.0 0.6  PROT 6.9 5.8*  ALBUMIN 4.1 3.6   CBG: No results for input(s): "GLUCAP" in the last 168 hours.  Discharge time spent: greater than 30 minutes.  Signed: Estill Cotta, MD [facility] 03/13/2022

## 2022-03-13 NOTE — Progress Notes (Signed)
Tyler [name] for Warfarin Indication: h/o DVT ISO Factor 5 Leiden, heterozygous  Allergies  Allergen Reactions   Apixaban Rash   Rivaroxaban Rash    Vital Signs: Temp: 97.6 F (36.4 C) (10/20 0335) Temp Source: Axillary (10/20 0335) BP: 122/72 (10/20 0335) Pulse Rate: 77 (10/20 0335)  Labs: Recent Labs    03/10/22 1504 03/10/22 1504 03/10/22 1702 03/10/22 1850 03/11/22 0725 03/11/22 1019 03/12/22 0439 03/12/22 1649 03/13/22 0427  HGB 14.1  --   --   --   --  13.0  --   --   --   HCT 44.2  --   --   --   --  40.2  --   --   --   PLT 224  --   --   --   --  245  --   --   --   APTT  --   --  35  --   --   --   --   --   --   LABPROT  --    < > 23.8*  --  24.7*  --  25.5*  --  26.3*  INR  --    < > 2.2*  --  2.3*  --  2.4*  --  2.4*  CREATININE 1.08  --   --  0.95  --   --   --   --   --   TROPONINIHS 21*  --   --  47* 51*  --   --  31*  --    < > = values in this interval not displayed.     Estimated Creatinine Clearance: 82.2 mL/min (by C-G formula based on SCr of 0.95 mg/dL).   Medical History: Past Medical History:  Diagnosis Date   AICD (automatic cardioverter/defibrillator) present    Anxiety    Arthritis    Atherosclerosis of artery of extremity with ulceration ([place]) 09/2019   left foot s/p toe amp requiring debridement and futher toe amputations.   Atrial fibrillation (HCC)    Cervical spinal stenosis    with neuropathy   CHF (congestive heart failure) (HCC)    Constipation    COPD (chronic obstructive pulmonary disease) (HCC)    COPD with acute exacerbation ([place]) 10/13/2016   Coronary artery disease    Depression    Dyspnea    Dysrhythmia    atrial fibrillation   Emphysema of lung (HCC)    Factor 5 Leiden mutation, heterozygous ([place])    on coumadin   Factor V Leiden mutation ([place])    GERD (gastroesophageal reflux disease)    Hypertension    Lung nodule seen on imaging study    being followed by dr. [name].  just watching it for last few years, without change   Mitral valve insufficiency    Moderate tricuspid insufficiency    Myocardial infarction [facility]) 2004   stent placed, pacemaker implanted 2005   Osteomyelitis ([place]) 10/2019   left foot   Oxygen dependent    requires 2L nasal prong oxygen 24 hours a day   Peripheral vascular disease ([place])    Presence of permanent cardiac pacemaker [phone]   PVD (peripheral vascular disease) ([place])    Sleep apnea    waiting to have sleep study. Used bipap while hospitalized and said it was great for him.   Medications:  After speaking to patient, confirmed that home dose is warfarin 4 mg daily  Assessment: Pt is 66  yo M with PMH Factor V leiden mutation (on warfarin), Afib, AICD, cervical spinal stenosis with neuropathy, VHD, COPD, emphysema, CAD, depression, HTN, GERD, PVD, OSA. Pt has a history of DVT and PE in the setting of Factor V leiden mutation and is on warfarin chronically at home. Re: Afib, pt's CHADSVASc score is 6. Pt presented hypotensive (was almost admitted to CCU) with worsening cough, SOB, sputum production. Imaging was negative for PE and no evidence of clots at this point. Pt currently VSS, slightly tachypneic, on 4 L [place]. INRs have been therapeutic x 3 since admission, so it is appropriate to place an ongoing order for warfarin 4 mg daily in accordance with patient's home regimen.  Date INR Warfarin Dose 10/18 2.3 4 mg 10/19 2.4 4 mg 10/20 2.4   Baseline Labs: aPTT - 35; INR - 2.2 Hgb - 14.1; Plts - 224  Goal of Therapy:  INR 2-3 Monitor platelets by anticoagulation protocol: Yes   Plan:  Give warfarin 4 mg x 1 today in accordance with home regimen Now that INR has been therapeutic x 3, will place ongoing order for warfarin 4 mg daily Recheck INR w/ AM labs Monitor CBC at least weekly for patients taking warfarin (CBC ordered for 10/23)  Aubery Lapping, PharmD 03/13/2022 9:13 AM

## 2022-03-15 LAB — CULTURE, BLOOD (ROUTINE X 2): Culture: NO GROWTH

## 2022-03-16 LAB — CULTURE, BLOOD (ROUTINE X 2)
Culture: NO GROWTH
Special Requests: ADEQUATE

## 2022-03-17 DIAGNOSIS — Z89611 Acquired absence of right leg above knee: Secondary | ICD-10-CM | POA: Diagnosis not present

## 2022-03-17 DIAGNOSIS — J9611 Chronic respiratory failure with hypoxia: Secondary | ICD-10-CM | POA: Diagnosis not present

## 2022-03-17 DIAGNOSIS — J431 Panlobular emphysema: Secondary | ICD-10-CM | POA: Diagnosis not present

## 2022-03-17 DIAGNOSIS — Z09 Encounter for follow-up examination after completed treatment for conditions other than malignant neoplasm: Secondary | ICD-10-CM | POA: Diagnosis not present

## 2022-03-23 ENCOUNTER — Encounter (INDEPENDENT_AMBULATORY_CARE_PROVIDER_SITE_OTHER): Payer: Self-pay

## 2022-03-25 DIAGNOSIS — I482 Chronic atrial fibrillation, unspecified: Secondary | ICD-10-CM | POA: Diagnosis not present

## 2022-03-25 DIAGNOSIS — J431 Panlobular emphysema: Secondary | ICD-10-CM | POA: Diagnosis not present

## 2022-03-25 DIAGNOSIS — J449 Chronic obstructive pulmonary disease, unspecified: Secondary | ICD-10-CM | POA: Diagnosis not present

## 2022-03-31 DIAGNOSIS — I482 Chronic atrial fibrillation, unspecified: Secondary | ICD-10-CM | POA: Diagnosis not present

## 2022-03-31 DIAGNOSIS — I10 Essential (primary) hypertension: Secondary | ICD-10-CM | POA: Diagnosis not present

## 2022-03-31 DIAGNOSIS — I071 Rheumatic tricuspid insufficiency: Secondary | ICD-10-CM | POA: Diagnosis not present

## 2022-03-31 DIAGNOSIS — I70219 Atherosclerosis of native arteries of extremities with intermittent claudication, unspecified extremity: Secondary | ICD-10-CM | POA: Diagnosis not present

## 2022-03-31 DIAGNOSIS — I5022 Chronic systolic (congestive) heart failure: Secondary | ICD-10-CM | POA: Diagnosis not present

## 2022-03-31 DIAGNOSIS — I2510 Atherosclerotic heart disease of native coronary artery without angina pectoris: Secondary | ICD-10-CM | POA: Diagnosis not present

## 2022-03-31 DIAGNOSIS — J431 Panlobular emphysema: Secondary | ICD-10-CM | POA: Diagnosis not present

## 2022-03-31 DIAGNOSIS — I340 Nonrheumatic mitral (valve) insufficiency: Secondary | ICD-10-CM | POA: Diagnosis not present

## 2022-04-01 NOTE — Telephone Encounter (Signed)
Error

## 2022-04-02 DIAGNOSIS — J9611 Chronic respiratory failure with hypoxia: Secondary | ICD-10-CM | POA: Diagnosis not present

## 2022-04-02 DIAGNOSIS — Z89612 Acquired absence of left leg above knee: Secondary | ICD-10-CM | POA: Diagnosis not present

## 2022-04-02 DIAGNOSIS — I739 Peripheral vascular disease, unspecified: Secondary | ICD-10-CM | POA: Diagnosis not present

## 2022-04-02 DIAGNOSIS — I2510 Atherosclerotic heart disease of native coronary artery without angina pectoris: Secondary | ICD-10-CM | POA: Diagnosis not present

## 2022-04-02 DIAGNOSIS — F320 Major depressive disorder, single episode, mild: Secondary | ICD-10-CM | POA: Diagnosis not present

## 2022-04-02 DIAGNOSIS — Z89611 Acquired absence of right leg above knee: Secondary | ICD-10-CM | POA: Diagnosis not present

## 2022-04-02 DIAGNOSIS — I482 Chronic atrial fibrillation, unspecified: Secondary | ICD-10-CM | POA: Diagnosis not present

## 2022-04-02 DIAGNOSIS — J431 Panlobular emphysema: Secondary | ICD-10-CM | POA: Diagnosis not present

## 2022-04-02 DIAGNOSIS — D6851 Activated protein C resistance: Secondary | ICD-10-CM | POA: Diagnosis not present

## 2022-04-04 IMAGING — DX DG FOOT COMPLETE 3+V*L*
3 series · 3 of 3 positions shown · non-contrast
Comparison: None.

CLINICAL DATA: Left foot pain. Technologist notes state gangrenous
foot. Concern for osteomyelitis. Two toes amputated 10 days ago.

EXAM:
LEFT FOOT - COMPLETE 3+ VIEW

[foot ap]
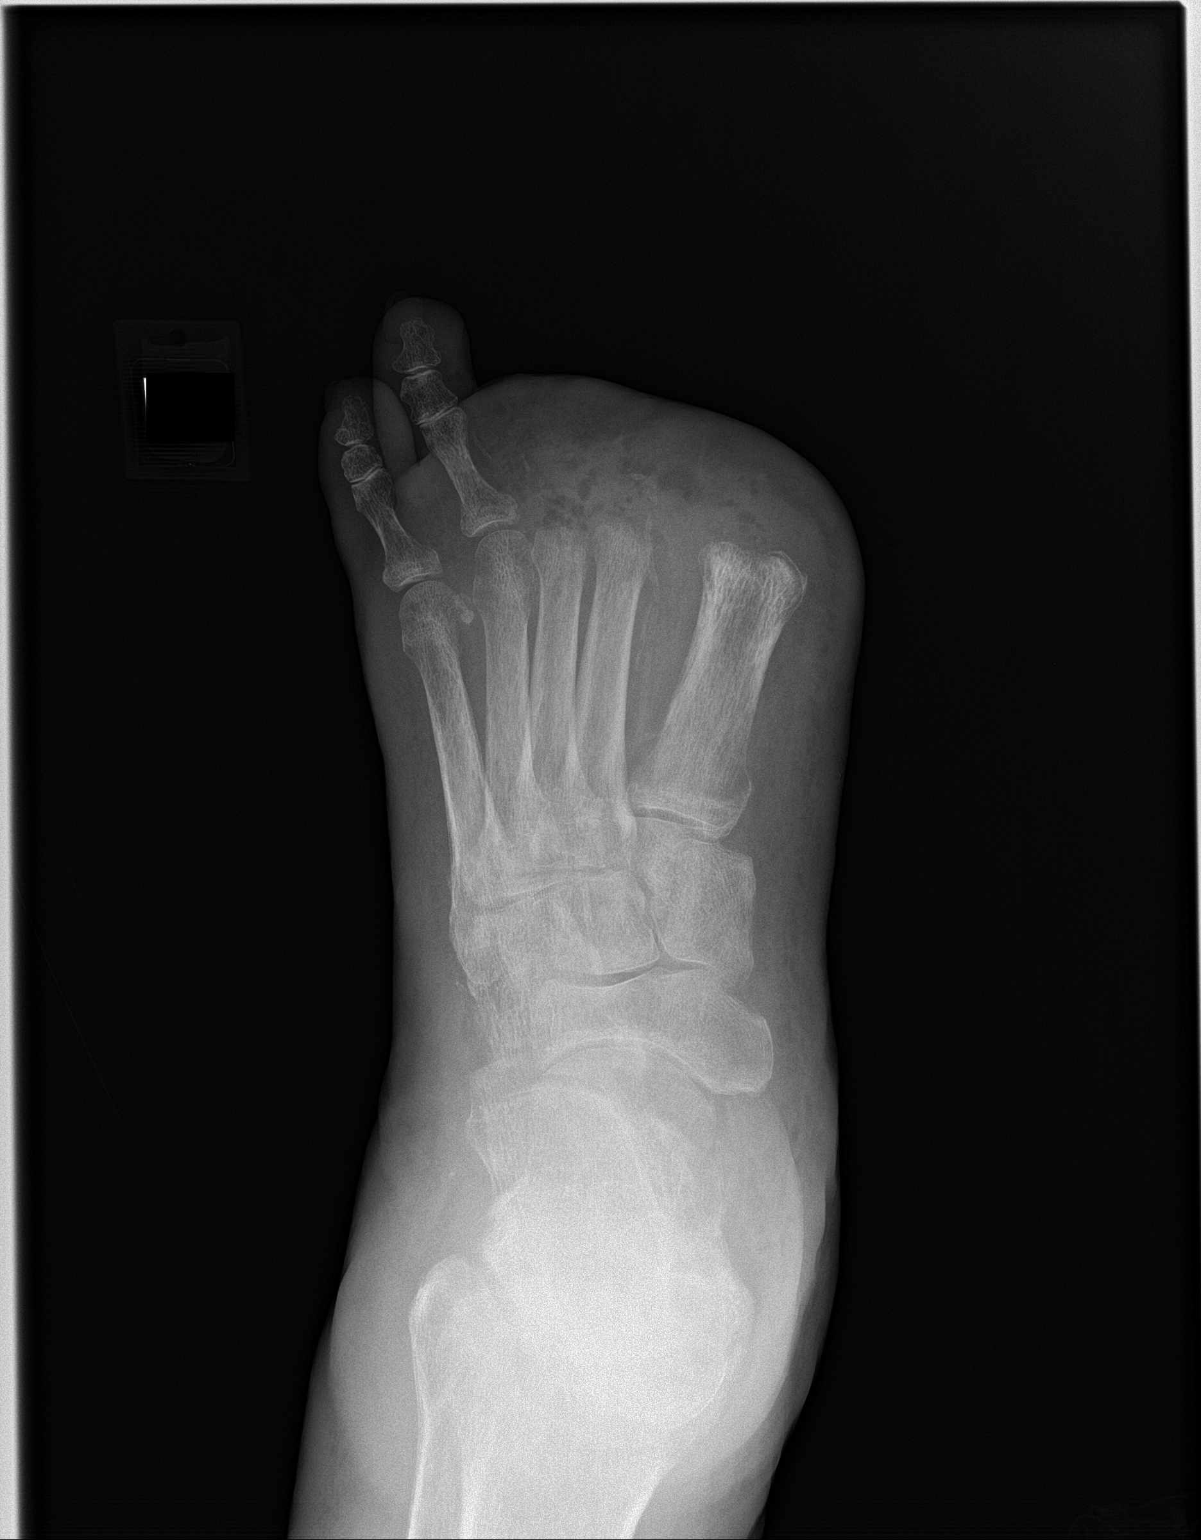

[foot obl]
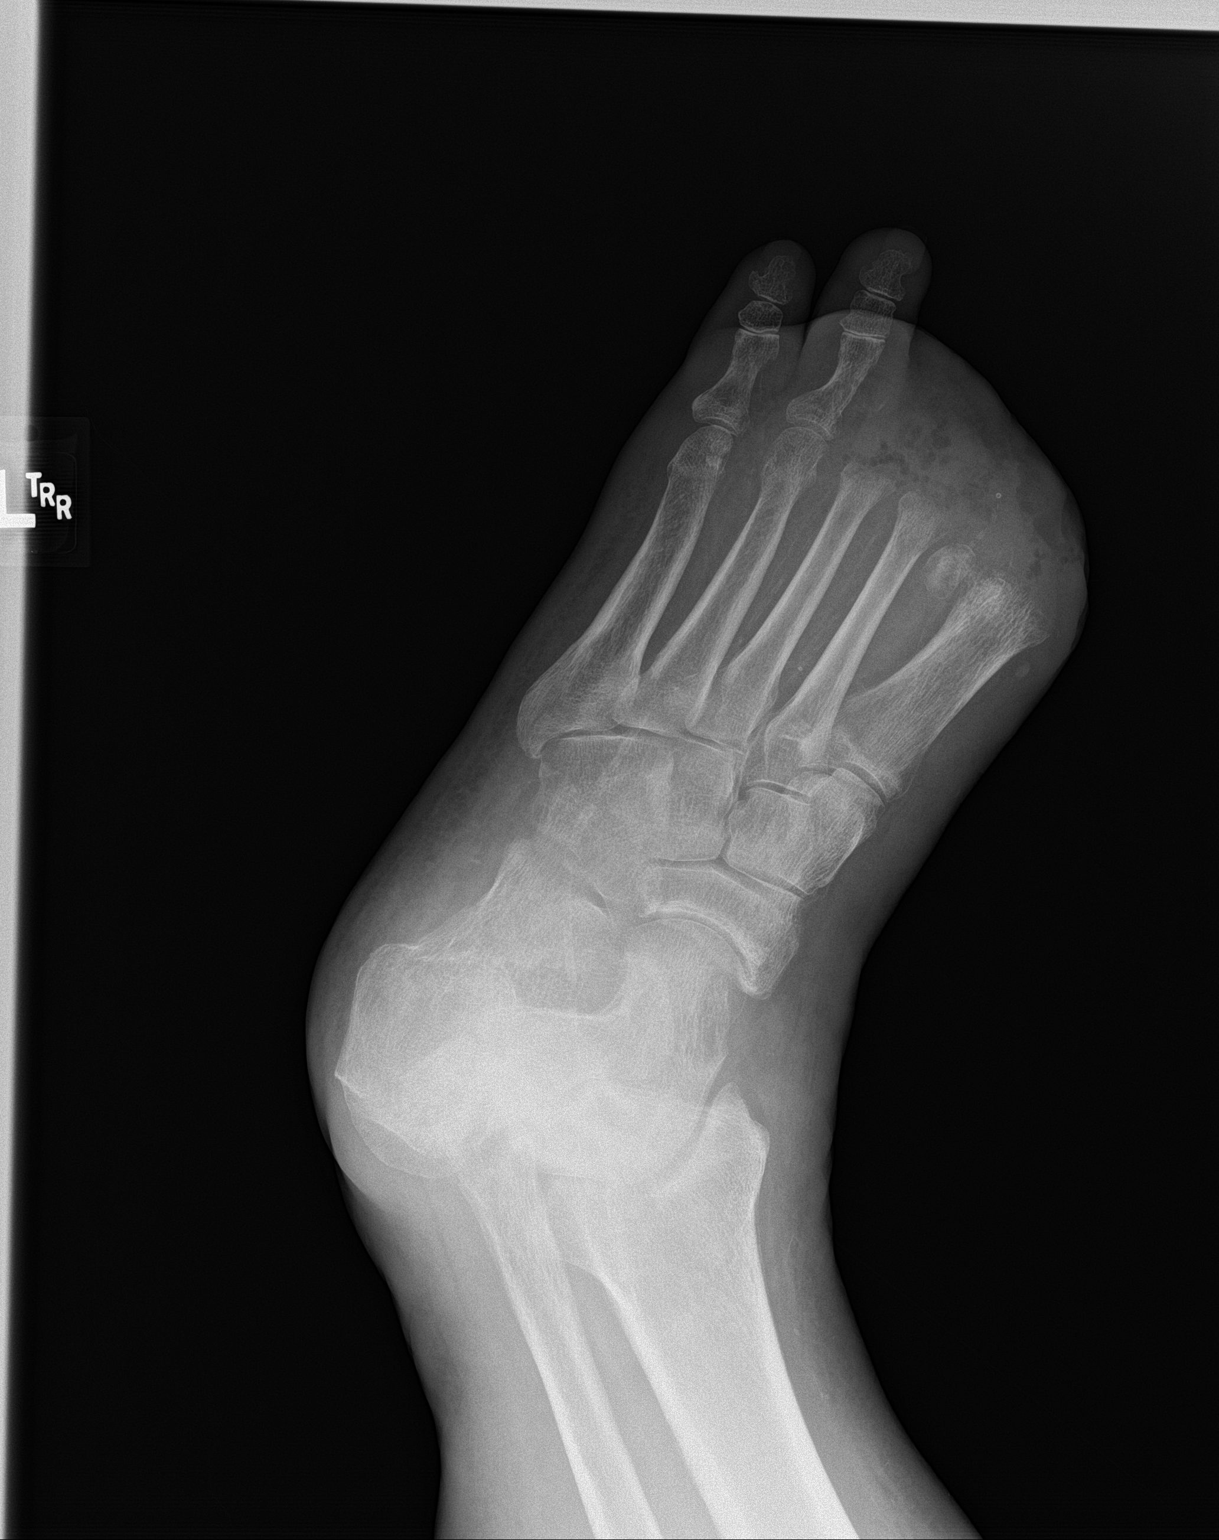

[foot lat]
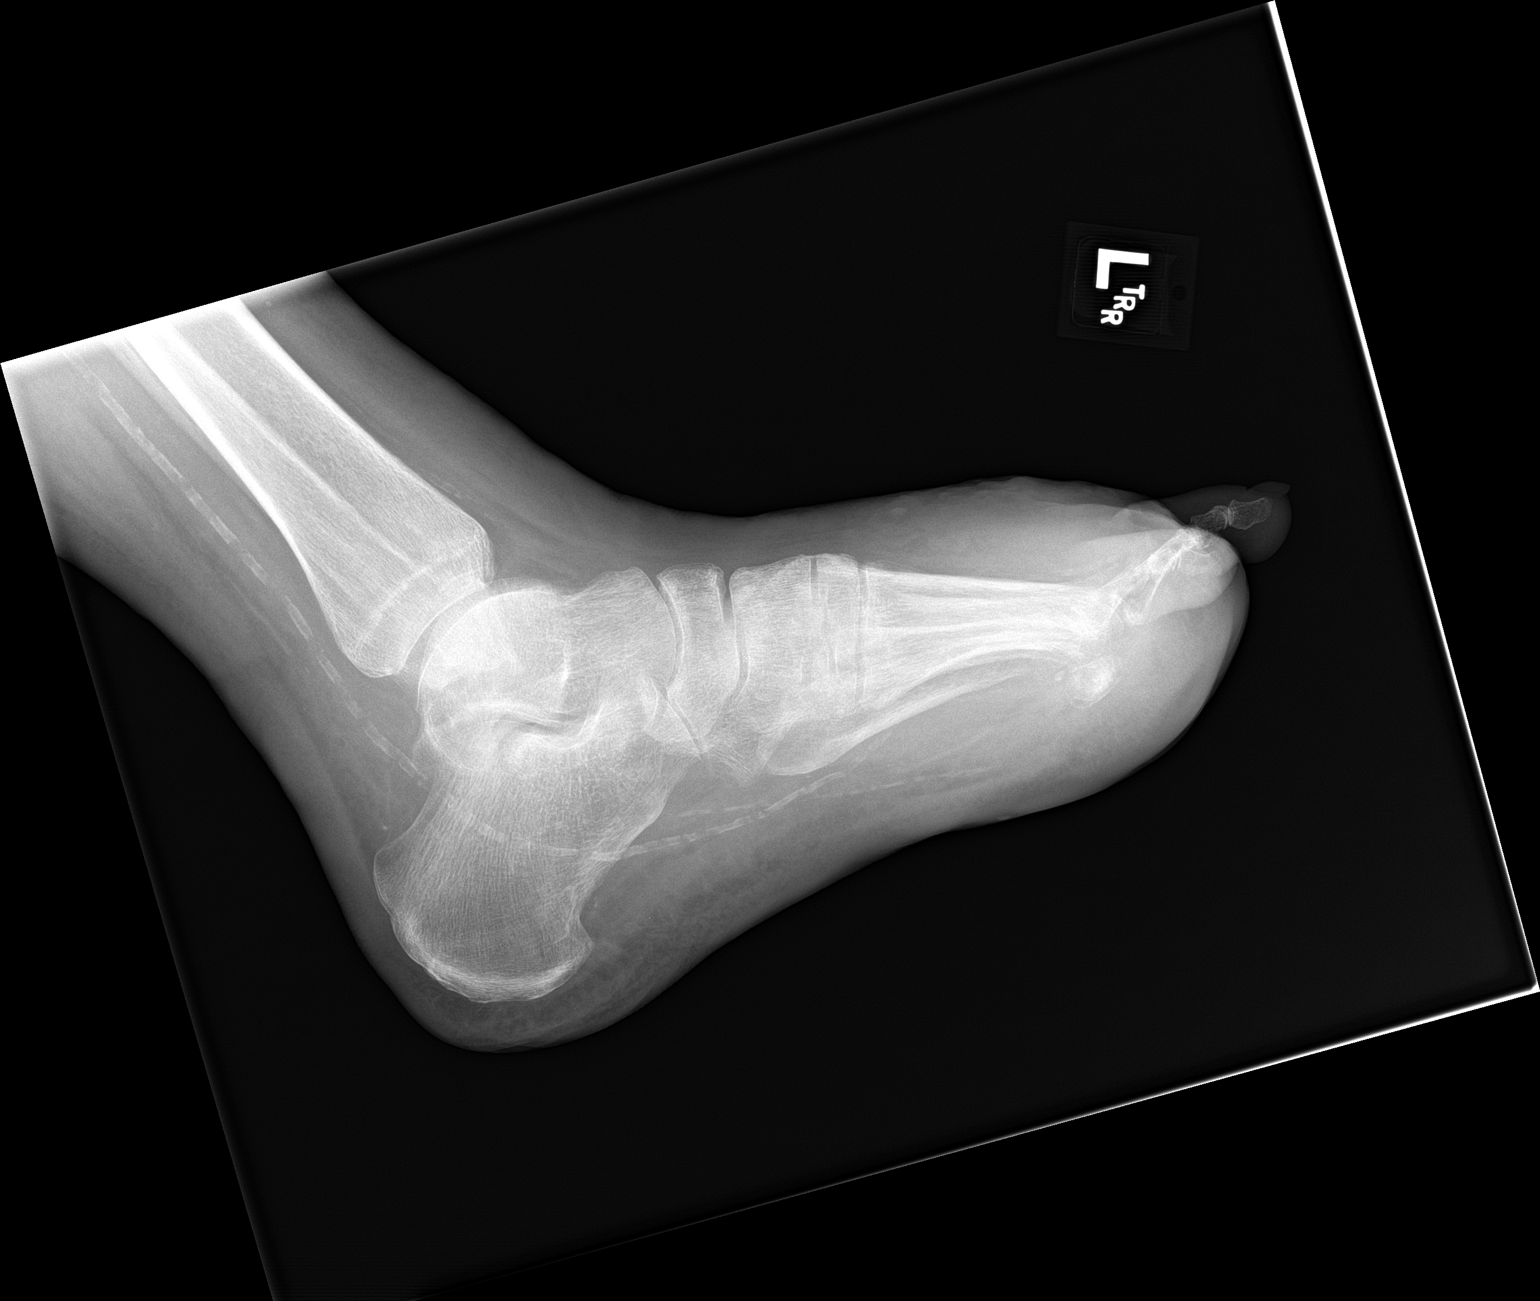

[3 of 3 positions shown; findings below may reference images not displayed]

FINDINGS: Resection of the first, second, and third digit. The first, second,
and third metatarsal heads are eroded. There is adjacent soft tissue
edema and mottled gas in the soft tissues. No evidence of
osteomyelitis of the in situ fourth and fifth digit. There is
generalized soft tissue edema. Advanced vascular calcifications.
IMPRESSION: 1. Findings consistent with osteomyelitis of the first, second, and
third metatarsal heads.
2. Soft tissue edema with mottled gas in the soft tissues suspicious
for soft tissue infection.
3. Generalized soft tissue edema. Advanced vascular calcifications.

## 2022-04-22 DIAGNOSIS — Z7901 Long term (current) use of anticoagulants: Secondary | ICD-10-CM | POA: Diagnosis not present

## 2022-04-22 DIAGNOSIS — D6851 Activated protein C resistance: Secondary | ICD-10-CM | POA: Diagnosis not present

## 2022-04-22 DIAGNOSIS — I482 Chronic atrial fibrillation, unspecified: Secondary | ICD-10-CM | POA: Diagnosis not present

## 2022-04-24 DIAGNOSIS — I482 Chronic atrial fibrillation, unspecified: Secondary | ICD-10-CM | POA: Diagnosis not present

## 2022-05-01 DIAGNOSIS — J449 Chronic obstructive pulmonary disease, unspecified: Secondary | ICD-10-CM | POA: Diagnosis not present

## 2022-05-01 DIAGNOSIS — R140 Abdominal distension (gaseous): Secondary | ICD-10-CM | POA: Diagnosis not present

## 2022-05-01 DIAGNOSIS — Z950 Presence of cardiac pacemaker: Secondary | ICD-10-CM | POA: Diagnosis not present

## 2022-05-01 DIAGNOSIS — K6389 Other specified diseases of intestine: Secondary | ICD-10-CM | POA: Diagnosis not present

## 2022-05-01 DIAGNOSIS — J9611 Chronic respiratory failure with hypoxia: Secondary | ICD-10-CM | POA: Diagnosis not present

## 2022-05-01 DIAGNOSIS — M545 Low back pain, unspecified: Secondary | ICD-10-CM | POA: Diagnosis not present

## 2022-05-01 DIAGNOSIS — R918 Other nonspecific abnormal finding of lung field: Secondary | ICD-10-CM | POA: Diagnosis not present

## 2022-05-08 DIAGNOSIS — I482 Chronic atrial fibrillation, unspecified: Secondary | ICD-10-CM | POA: Diagnosis not present

## 2022-05-08 DIAGNOSIS — Z7901 Long term (current) use of anticoagulants: Secondary | ICD-10-CM | POA: Diagnosis not present

## 2022-05-11 DIAGNOSIS — Z7901 Long term (current) use of anticoagulants: Secondary | ICD-10-CM | POA: Diagnosis not present

## 2022-05-11 DIAGNOSIS — I482 Chronic atrial fibrillation, unspecified: Secondary | ICD-10-CM | POA: Diagnosis not present

## 2022-05-20 DIAGNOSIS — I482 Chronic atrial fibrillation, unspecified: Secondary | ICD-10-CM | POA: Diagnosis not present

## 2022-05-28 DIAGNOSIS — J449 Chronic obstructive pulmonary disease, unspecified: Secondary | ICD-10-CM | POA: Diagnosis not present

## 2022-05-28 DIAGNOSIS — I482 Chronic atrial fibrillation, unspecified: Secondary | ICD-10-CM | POA: Diagnosis not present

## 2022-06-04 DIAGNOSIS — I482 Chronic atrial fibrillation, unspecified: Secondary | ICD-10-CM | POA: Diagnosis not present

## 2022-06-23 DIAGNOSIS — Z7901 Long term (current) use of anticoagulants: Secondary | ICD-10-CM | POA: Diagnosis not present

## 2022-06-23 DIAGNOSIS — Z125 Encounter for screening for malignant neoplasm of prostate: Secondary | ICD-10-CM | POA: Diagnosis not present

## 2022-06-23 DIAGNOSIS — I2510 Atherosclerotic heart disease of native coronary artery without angina pectoris: Secondary | ICD-10-CM | POA: Diagnosis not present

## 2022-06-23 DIAGNOSIS — I482 Chronic atrial fibrillation, unspecified: Secondary | ICD-10-CM | POA: Diagnosis not present

## 2022-06-24 DIAGNOSIS — I2510 Atherosclerotic heart disease of native coronary artery without angina pectoris: Secondary | ICD-10-CM | POA: Diagnosis not present

## 2022-06-24 DIAGNOSIS — Z125 Encounter for screening for malignant neoplasm of prostate: Secondary | ICD-10-CM | POA: Diagnosis not present

## 2022-06-24 DIAGNOSIS — I482 Chronic atrial fibrillation, unspecified: Secondary | ICD-10-CM | POA: Diagnosis not present

## 2022-06-30 DIAGNOSIS — I70219 Atherosclerosis of native arteries of extremities with intermittent claudication, unspecified extremity: Secondary | ICD-10-CM | POA: Diagnosis not present

## 2022-06-30 DIAGNOSIS — I2510 Atherosclerotic heart disease of native coronary artery without angina pectoris: Secondary | ICD-10-CM | POA: Diagnosis not present

## 2022-06-30 DIAGNOSIS — J449 Chronic obstructive pulmonary disease, unspecified: Secondary | ICD-10-CM | POA: Diagnosis not present

## 2022-06-30 DIAGNOSIS — I482 Chronic atrial fibrillation, unspecified: Secondary | ICD-10-CM | POA: Diagnosis not present

## 2022-06-30 DIAGNOSIS — Z0000 Encounter for general adult medical examination without abnormal findings: Secondary | ICD-10-CM | POA: Diagnosis not present

## 2022-06-30 DIAGNOSIS — I10 Essential (primary) hypertension: Secondary | ICD-10-CM | POA: Diagnosis not present

## 2022-06-30 DIAGNOSIS — D6851 Activated protein C resistance: Secondary | ICD-10-CM | POA: Diagnosis not present

## 2022-06-30 DIAGNOSIS — Z89511 Acquired absence of right leg below knee: Secondary | ICD-10-CM | POA: Diagnosis not present

## 2022-06-30 DIAGNOSIS — Z1331 Encounter for screening for depression: Secondary | ICD-10-CM | POA: Diagnosis not present

## 2022-06-30 DIAGNOSIS — Z0001 Encounter for general adult medical examination with abnormal findings: Secondary | ICD-10-CM | POA: Diagnosis not present

## 2022-06-30 DIAGNOSIS — J9611 Chronic respiratory failure with hypoxia: Secondary | ICD-10-CM | POA: Diagnosis not present

## 2022-07-07 DIAGNOSIS — I5022 Chronic systolic (congestive) heart failure: Secondary | ICD-10-CM | POA: Diagnosis not present

## 2022-07-09 ENCOUNTER — Encounter: Payer: Self-pay | Admitting: *Deleted

## 2022-07-09 ENCOUNTER — Telehealth: Payer: Self-pay | Admitting: *Deleted

## 2022-07-09 NOTE — Patient Outreach (Signed)
  Care Coordination   Initial Visit Note   07/09/2022 Name: KADON ANDRUS MRN: [id number] DOB: 10-25-1955  YUSSEF JORGE is a 67 y.o. year old male who sees [name], MD for primary care. I spoke with  [name] by phone today.  What matters to the patients health and wellness today?  Keeping shortness of breath and COPD managed.      Goals Addressed             This Visit's Progress    Effective management of COPD       Care Coordination Interventions: Provided patient with basic written and verbal COPD education on self care/management/and exacerbation prevention Advised patient to track and manage COPD triggers Provided instruction about proper use of medications used for management of COPD including inhalers Advised patient to self assesses COPD action plan zone and make appointment with provider if in the yellow zone for 48 hours without improvement Screening for signs and symptoms of depression related to chronic disease state  Assessed social determinant of health barriers Discussed use of home O2 at 2.5 liters and Trilogy at night.  State cost of machine is $140/month, inquire about financial assistance.  Will collaborate with CSW.  Was seen by pulmonary and PCP in the last couple months, has follow up with both on 5/6.          SDOH assessments and interventions completed:  Yes  SDOH Interventions Today    Flowsheet Row Most Recent Value  SDOH Interventions   Food Insecurity Interventions Intervention Not Indicated  Housing Interventions Intervention Not Indicated  Transportation Interventions Intervention Not Indicated  Utilities Interventions Intervention Not Indicated        Care Coordination Interventions:  Yes, provided   Follow up plan: Follow up call scheduled for 5/7    Encounter Outcome:  Pt. Visit Completed   Valente David, RN, MSN, [place] Care Management Care Management Coordinator [phone]

## 2022-07-09 NOTE — Patient Instructions (Signed)
Visit Information  Thank you for taking time to visit with me today. Please don't hesitate to contact me if I can be of assistance to you before our next scheduled telephone appointment.  Following are the goals we discussed today:  Keep using oxygen as instructed. Contact company that provided Trilogy, ask if they have charity programs to offset the monthly cost.   Our next appointment is by telephone on 5/7  Please call the care guide team at [phone] if you need to cancel or reschedule your appointment.   Please call the Suicide and Crisis Lifeline: 988 call the [place] National Suicide Prevention Lifeline: 709-787-6622 or TTY: 628-402-4537 TTY 867-057-5096) to talk to a trained counselor call 1-800-273-TALK (toll free, 24 hour hotline) call 911 if you are experiencing a Mental Health or [place] or need someone to talk to.  Patient verbalizes understanding of instructions and care plan provided today and agrees to view in [employer]. Active MyChart status and patient understanding of how to access instructions and care plan via MyChart confirmed with patient.     The patient has been provided with contact information for the care management team and has been advised to call with any health related questions or concerns.   Tyler [name], [place], [place] Care Management Care Management Coordinator [phone]

## 2022-07-09 NOTE — Patient Outreach (Signed)
  Care Coordination   07/09/2022 Name: Tyler [name] MRN: [id number] DOB: [birth year]   Care Coordination Outreach Attempts:  An unsuccessful telephone outreach was attempted today to offer the patient information about available care coordination services as a benefit of their health plan.   Follow Up Plan:  Additional outreach attempts will be made to offer the patient care coordination information and services.   Encounter Outcome:  No Answer   Care Coordination Interventions:  No, not indicated    Valente David, RN, MSN, [facility] Care Management Care Management Coordinator [phone]

## 2022-07-22 DIAGNOSIS — Z7901 Long term (current) use of anticoagulants: Secondary | ICD-10-CM | POA: Diagnosis not present

## 2022-07-22 DIAGNOSIS — I482 Chronic atrial fibrillation, unspecified: Secondary | ICD-10-CM | POA: Diagnosis not present

## 2022-08-18 DIAGNOSIS — Z7901 Long term (current) use of anticoagulants: Secondary | ICD-10-CM | POA: Diagnosis not present

## 2022-08-18 DIAGNOSIS — I482 Chronic atrial fibrillation, unspecified: Secondary | ICD-10-CM | POA: Diagnosis not present

## 2022-09-02 DIAGNOSIS — I482 Chronic atrial fibrillation, unspecified: Secondary | ICD-10-CM | POA: Diagnosis not present

## 2022-09-02 DIAGNOSIS — Z7901 Long term (current) use of anticoagulants: Secondary | ICD-10-CM | POA: Diagnosis not present

## 2022-09-28 DIAGNOSIS — M5000 Cervical disc disorder with myelopathy, unspecified cervical region: Secondary | ICD-10-CM | POA: Diagnosis not present

## 2022-09-28 DIAGNOSIS — J4550 Severe persistent asthma, uncomplicated: Secondary | ICD-10-CM | POA: Diagnosis not present

## 2022-09-28 DIAGNOSIS — J449 Chronic obstructive pulmonary disease, unspecified: Secondary | ICD-10-CM | POA: Diagnosis not present

## 2022-09-28 DIAGNOSIS — J4489 Other specified chronic obstructive pulmonary disease: Secondary | ICD-10-CM | POA: Diagnosis not present

## 2022-09-28 DIAGNOSIS — J479 Bronchiectasis, uncomplicated: Secondary | ICD-10-CM | POA: Diagnosis not present

## 2022-09-28 DIAGNOSIS — I482 Chronic atrial fibrillation, unspecified: Secondary | ICD-10-CM | POA: Diagnosis not present

## 2022-09-29 ENCOUNTER — Ambulatory Visit: Payer: Self-pay | Admitting: *Deleted

## 2022-09-29 NOTE — Patient Outreach (Signed)
  Care Coordination   Follow Up Visit Note   09/29/2022 Name: Tyler [name] MRN: [id number] DOB: Mar 17, 1956  [name] is a 67 y.o. year old male who sees Gracelyn Nurse, MD for primary care. I spoke with  Arnetha Courser by phone today.  What matters to the patients health and wellness today?  Decrease episodes of shortness of breath, have the ability to do any activity without trouble breathing.    Goals Addressed             This Visit's Progress    Effective management of COPD   Not on track    Care Coordination Interventions: Provided patient with basic written and verbal COPD education on self care/management/and exacerbation prevention Advised patient to track and manage COPD triggers Provided instruction about proper use of medications used for management of COPD including inhalers Advised patient to self assesses COPD action plan zone and make appointment with provider if in the yellow zone for 48 hours without improvement Discussed use of home O2 at 2.5 liters and Trilogy at night.  State cost of machine is $140/month, inquire about financial assistance. Continuing to collaborate with CSW for financial assistance          SDOH assessments and interventions completed:  No     Care Coordination Interventions:  Yes, provided   Interventions Today    Flowsheet Row Most Recent Value  Chronic Disease   Chronic disease during today's visit Chronic Obstructive Pulmonary Disease (COPD), Congestive Heart Failure (CHF)  [EF 30%]  General Interventions   General Interventions Discussed/Reviewed General Interventions Reviewed, Doctor Visits, Communication with  Doctor Visits Discussed/Reviewed Doctor Visits Reviewed, PCP, Specialist  PCP/Specialist Visits Compliance with follow-up visit  [Visits complete with PCP and pulmonary on 5/6.  State he was told to consider being on transplant list.  He will need lung and heart transplant.  If he chooses not to go on  list, he is considering palliative/hospice]  Communication with Social Work  [CSW looking for programs to help with paying for Trilogy]  Exercise Interventions   Exercise Discussed/Reviewed Weight Managment  Weight Management Weight maintenance  [weight consistent 165-168 pounds]  Education Interventions   Education Provided Provided Education  Provided Verbal Education On Medication, When to see the doctor  [Taken off Dupixent and prednisone, placed on a different steroid (decadron) for the next 60 days, wife will pick up today.  Monitors blood pressure, usually 90s/60s]        Follow up plan: Follow up call scheduled for 6/6    Encounter Outcome:  Pt. Visit Completed   Kemper Durie, RN, MSN, [facility] Care Management Care Management Coordinator [phone]

## 2022-10-06 DIAGNOSIS — I5022 Chronic systolic (congestive) heart failure: Secondary | ICD-10-CM | POA: Diagnosis not present

## 2022-10-13 DIAGNOSIS — I482 Chronic atrial fibrillation, unspecified: Secondary | ICD-10-CM | POA: Diagnosis not present

## 2022-10-13 DIAGNOSIS — Z7901 Long term (current) use of anticoagulants: Secondary | ICD-10-CM | POA: Diagnosis not present

## 2022-10-26 DIAGNOSIS — Z7901 Long term (current) use of anticoagulants: Secondary | ICD-10-CM | POA: Diagnosis not present

## 2022-10-26 DIAGNOSIS — I4821 Permanent atrial fibrillation: Secondary | ICD-10-CM | POA: Diagnosis not present

## 2022-10-27 DIAGNOSIS — I5022 Chronic systolic (congestive) heart failure: Secondary | ICD-10-CM | POA: Diagnosis not present

## 2022-10-27 DIAGNOSIS — I482 Chronic atrial fibrillation, unspecified: Secondary | ICD-10-CM | POA: Diagnosis not present

## 2022-10-29 ENCOUNTER — Ambulatory Visit: Payer: Self-pay | Admitting: *Deleted

## 2022-10-29 NOTE — Patient Outreach (Signed)
  Care Coordination   Follow Up Visit Note   10/29/2022 Name: Tyler [name] MRN: [id number] DOB: [birth year]  Tyler [name] is a 67 y.o. year old male who sees Gracelyn Nurse, MD for primary care. I spoke with  Arnetha Courser by phone today.  What matters to the patients health and wellness today?  Maintain breathing, ability to increase activity with minimal shortness of breath.    Goals Addressed             This Visit's Progress    Effective management of COPD   On track    Care Coordination Interventions: Provided patient with basic written and verbal COPD education on self care/management/and exacerbation prevention Advised patient to track and manage COPD triggers Provided instruction about proper use of medications used for management of COPD including inhalers Advised patient to self assesses COPD action plan zone and make appointment with provider if in the yellow zone for 48 hours without improvement Discussed use of home O2 at 2.5 liters and Trilogy at night.  State cost of machine is $140/month, inquire about financial assistance.           SDOH assessments and interventions completed:  No     Care Coordination Interventions:  Yes, provided   Interventions Today    Flowsheet Row Most Recent Value  Chronic Disease   Chronic disease during today's visit Chronic Obstructive Pulmonary Disease (COPD)  General Interventions   General Interventions Discussed/Reviewed General Interventions Reviewed, Doctor Visits, Durable Medical Equipment (DME)  [State there is no current conversation about lung transplant, report providers felt his heart would not handle the surgery]  Doctor Visits Discussed/Reviewed Doctor Visits Reviewed, PCP, Specialist  Durable Medical Equipment (DME) Other  [Cost of Trilogy machiine continues to be an issue, state he is looking into changing DME companies from [employer] to [employer] to save money. Report oxygen saturations remain greater  than 90% but does still have shortness of breath with activity]  PCP/Specialist Visits Compliance with follow-up visit  [Pulmonary 7/1 and PCP on 8/7]  Education Interventions   Education Provided Provided Education  Provided Verbal Education On Medication, When to see the doctor  [Report use of Decadron and antibiotics has made shortness of breath slightly better.  Continues to use inhalers and nebulizers when needed]       Follow up plan: Follow up call scheduled for 7/5    Encounter Outcome:  Pt. Visit Completed   Kemper Durie, RN, MSN, [facility] Care Management Care Management Coordinator [phone]

## 2022-11-02 DIAGNOSIS — Z7901 Long term (current) use of anticoagulants: Secondary | ICD-10-CM | POA: Diagnosis not present

## 2022-11-09 DIAGNOSIS — Z7901 Long term (current) use of anticoagulants: Secondary | ICD-10-CM | POA: Diagnosis not present

## 2022-11-17 DIAGNOSIS — Z7901 Long term (current) use of anticoagulants: Secondary | ICD-10-CM | POA: Diagnosis not present

## 2022-11-17 DIAGNOSIS — I4821 Permanent atrial fibrillation: Secondary | ICD-10-CM | POA: Diagnosis not present

## 2022-11-27 ENCOUNTER — Ambulatory Visit: Payer: Self-pay | Admitting: *Deleted

## 2022-11-27 NOTE — Patient Outreach (Signed)
  Care Coordination   11/27/2022 Name: Tyler [name] MRN: [id number] DOB: 06-21-1955   Care Coordination Outreach Attempts:  An unsuccessful telephone outreach was attempted for a scheduled appointment today.  Follow Up Plan:  Additional outreach attempts will be made to offer the patient care coordination information and services.   Encounter Outcome:  No Answer   Care Coordination Interventions:  No, not indicated    Kemper Durie, RN, MSN, [facility] Care Management Care Management Coordinator [phone]

## 2022-12-01 DIAGNOSIS — J449 Chronic obstructive pulmonary disease, unspecified: Secondary | ICD-10-CM | POA: Diagnosis not present

## 2022-12-01 DIAGNOSIS — R0602 Shortness of breath: Secondary | ICD-10-CM | POA: Diagnosis not present

## 2022-12-02 ENCOUNTER — Telehealth: Payer: Self-pay | Admitting: *Deleted

## 2022-12-02 NOTE — Progress Notes (Unsigned)
  Care Coordination Note  12/02/2022 Name: [name] MRN: [id number] DOB: [birth year]  Tyler [name] is a 67 y.o. year old male who is a primary care patient of Gracelyn Nurse, MD and is actively engaged with the care management team. I reached out to [employer] by phone today to assist with re-scheduling a follow up visit with the RN Case Manager  Follow up plan: Unsuccessful telephone outreach attempt made. A HIPAA compliant phone message was left for the patient providing contact information and requesting a return call.   Burman Nieves, CCMA Care Coordination Care Guide Direct Dial: [phone]

## 2022-12-03 NOTE — Progress Notes (Signed)
  Care Coordination Note  12/03/2022 Name: DWAN HEMMELGARN MRN: [id number] DOB: 1955-08-25  ORY ELTING is a 67 y.o. year old male who is a primary care patient of Gracelyn Nurse, MD and is actively engaged with the care management team. I reached out to Arnetha Courser by phone today to assist with re-scheduling a follow up visit with the RN Case Manager  Follow up plan: Telephone appointment with care management team member scheduled for: 12/18/2022  Burman Nieves, [facility] Care Coordination Care Guide Direct Dial: [phone]

## 2022-12-04 ENCOUNTER — Other Ambulatory Visit: Payer: Self-pay | Admitting: Pulmonary Disease

## 2022-12-04 DIAGNOSIS — J449 Chronic obstructive pulmonary disease, unspecified: Secondary | ICD-10-CM

## 2022-12-07 DIAGNOSIS — Z7901 Long term (current) use of anticoagulants: Secondary | ICD-10-CM | POA: Diagnosis not present

## 2022-12-11 ENCOUNTER — Ambulatory Visit
Admission: RE | Admit: 2022-12-11 | Discharge: 2022-12-11 | Disposition: A | Discharge status: Home / Self Care | Payer: Medicare HMO | Source: Ambulatory Visit | Attending: Pulmonary Disease | Admitting: Pulmonary Disease

## 2022-12-11 DIAGNOSIS — J449 Chronic obstructive pulmonary disease, unspecified: Secondary | ICD-10-CM | POA: Insufficient documentation

## 2022-12-11 DIAGNOSIS — R0602 Shortness of breath: Secondary | ICD-10-CM | POA: Diagnosis not present

## 2022-12-11 DIAGNOSIS — I700 Atherosclerosis of aorta: Secondary | ICD-10-CM | POA: Diagnosis not present

## 2022-12-11 DIAGNOSIS — I2510 Atherosclerotic heart disease of native coronary artery without angina pectoris: Secondary | ICD-10-CM | POA: Diagnosis not present

## 2022-12-11 DIAGNOSIS — J439 Emphysema, unspecified: Secondary | ICD-10-CM | POA: Diagnosis not present

## 2022-12-11 LAB — POCT I-STAT CREATININE: Creatinine, Ser: 0.8 mg/dL (ref 0.61–1.24)

## 2022-12-11 MED ORDER — IOHEXOL 350 MG/ML SOLN
75.0000 mL | Freq: Once | INTRAVENOUS | Status: AC | PRN
  Administered 2022-12-11: 75 mL via INTRAVENOUS

## 2022-12-14 DIAGNOSIS — Z7901 Long term (current) use of anticoagulants: Secondary | ICD-10-CM | POA: Diagnosis not present

## 2022-12-14 DIAGNOSIS — I4821 Permanent atrial fibrillation: Secondary | ICD-10-CM | POA: Diagnosis not present

## 2022-12-15 DIAGNOSIS — Z7901 Long term (current) use of anticoagulants: Secondary | ICD-10-CM | POA: Diagnosis not present

## 2022-12-18 ENCOUNTER — Ambulatory Visit: Payer: Self-pay | Admitting: *Deleted

## 2022-12-18 NOTE — Patient Outreach (Signed)
  Care Coordination   Follow Up Visit Note   12/18/2022 Name: Tyler [name] MRN: [id number] DOB: [birth year]  Tyler [name] is a 67 y.o. year old male who sees Gracelyn Nurse, MD for primary care. I spoke with  Arnetha Courser by phone today.  What matters to the patients health and wellness today?  Ability to do more with less shortness of breath    Goals Addressed             This Visit's Progress    Effective management of COPD   On track    Care Coordination Interventions: Provided patient with basic written and verbal COPD education on self care/management/and exacerbation prevention Advised patient to track and manage COPD triggers Provided instruction about proper use of medications used for management of COPD including inhalers Advised patient to self assesses COPD action plan zone and make appointment with provider if in the yellow zone for 48 hours without improvement         SDOH assessments and interventions completed:  No     Care Coordination Interventions:  Yes, provided   Interventions Today    Flowsheet Row Most Recent Value  Chronic Disease   Chronic disease during today's visit Chronic Obstructive Pulmonary Disease (COPD)  [Continues to have intermittent shortness of breath with exertion]  General Interventions   General Interventions Discussed/Reviewed General Interventions Reviewed, Doctor Visits, Durable Medical Equipment (DME)  Doctor Visits Discussed/Reviewed Doctor Visits Reviewed, PCP, Specialist  [facility] 8/7, pulmonary 8/8, Cardiology 8/13]  Durable Medical Equipment (DME) Other  [Trilogy - Wife still need to call [employer] to see if price for machine will be cheaper]  PCP/Specialist Visits Compliance with follow-up visit  Education Interventions   Education Provided Provided Education  Provided Verbal Education On Medication, When to see the doctor  [Using inhalers and nebulizers as well as steroid for minimal relief. He understands COPD  is end stage]       Follow up plan: Follow up call scheduled for 8/22    Encounter Outcome:  Pt. Visit Completed   Kemper Durie, RN, MSN, [facility] Care Management Care Management Coordinator [phone]

## 2022-12-21 DIAGNOSIS — Z7901 Long term (current) use of anticoagulants: Secondary | ICD-10-CM | POA: Diagnosis not present

## 2022-12-28 DIAGNOSIS — Z7901 Long term (current) use of anticoagulants: Secondary | ICD-10-CM | POA: Diagnosis not present

## 2023-01-05 DIAGNOSIS — R0981 Nasal congestion: Secondary | ICD-10-CM | POA: Diagnosis not present

## 2023-01-05 DIAGNOSIS — I5022 Chronic systolic (congestive) heart failure: Secondary | ICD-10-CM | POA: Diagnosis not present

## 2023-01-05 DIAGNOSIS — J441 Chronic obstructive pulmonary disease with (acute) exacerbation: Secondary | ICD-10-CM | POA: Diagnosis not present

## 2023-01-05 DIAGNOSIS — J449 Chronic obstructive pulmonary disease, unspecified: Secondary | ICD-10-CM | POA: Diagnosis not present

## 2023-01-05 DIAGNOSIS — Z20822 Contact with and (suspected) exposure to covid-19: Secondary | ICD-10-CM | POA: Diagnosis not present

## 2023-01-05 DIAGNOSIS — R062 Wheezing: Secondary | ICD-10-CM | POA: Diagnosis not present

## 2023-01-05 DIAGNOSIS — I10 Essential (primary) hypertension: Secondary | ICD-10-CM | POA: Diagnosis not present

## 2023-01-11 DIAGNOSIS — Z7901 Long term (current) use of anticoagulants: Secondary | ICD-10-CM | POA: Diagnosis not present

## 2023-01-11 DIAGNOSIS — I4821 Permanent atrial fibrillation: Secondary | ICD-10-CM | POA: Diagnosis not present

## 2023-01-12 DIAGNOSIS — Z7901 Long term (current) use of anticoagulants: Secondary | ICD-10-CM | POA: Diagnosis not present

## 2023-01-14 ENCOUNTER — Ambulatory Visit: Payer: Self-pay | Admitting: *Deleted

## 2023-01-14 NOTE — Patient Outreach (Signed)
  Care Coordination   01/14/2023 Name: Tyler [name] MRN: [id number] DOB: [birth year]   Care Coordination Outreach Attempts:  An unsuccessful telephone outreach was attempted for a scheduled appointment today.  Follow Up Plan:  Additional outreach attempts will be made to offer the patient care coordination information and services.   Encounter Outcome:  No Answer   Care Coordination Interventions:  No, not indicated    Kemper Durie, RN, MSN, [facility] Care Management Care Management Coordinator [phone]

## 2023-01-15 ENCOUNTER — Telehealth: Payer: Self-pay | Admitting: *Deleted

## 2023-01-15 NOTE — Patient Outreach (Signed)
  Care Coordination   Follow Up Visit Note   01/16/2023 Name: Tyler [name] MRN: [id number] DOB: 1956-02-10  KIOWA [name] is a 67 y.o. year old male who sees Gracelyn Nurse, MD for primary care. I spoke with  Arnetha Courser by phone today.  What matters to the patients health and wellness today?  Continue to recover from Covid and develop plan for his end stage COPD.    Goals Addressed             This Visit's Progress    Effective management of COPD   On track    Care Coordination Interventions: Provided patient with basic written and verbal COPD education on self care/management/and exacerbation prevention Advised patient to track and manage COPD triggers Provided instruction about proper use of medications used for management of COPD including inhalers Advised patient to self assesses COPD action plan zone and make appointment with provider if in the yellow zone for 48 hours without improvement         SDOH assessments and interventions completed:  No     Care Coordination Interventions:  Yes, provided   Interventions Today    Flowsheet Row Most Recent Value  Chronic Disease   Chronic disease during today's visit Chronic Obstructive Pulmonary Disease (COPD), Other  [Recently diagnosed with Covid, state he is better now]  General Interventions   General Interventions Discussed/Reviewed General Interventions Reviewed, Doctor Visits  Doctor Visits Discussed/Reviewed Doctor Visits Reviewed, PCP  [facility] 8/28, listed as virtual, advised to call office to confirm this is virtual]  PCP/Specialist Visits Compliance with follow-up visit  Education Interventions   Education Provided Provided Education  Provided Verbal Education On When to see the doctor, [employer] hospice.  Also discussed infection prevention, wearing masks and staying away from sick contacts]  Advanced Directive Interventions   Advanced Directives Discussed/Reviewed Advanced  Care Planning, End of Life  End of Life Hospice  [State he was told he would either need a lung transplant or hospice.  He will discuss options with both teams prior to making final decision]       Follow up plan: Follow up call scheduled for 9/27    Encounter Outcome:  Pt. Visit Completed   Tyler Durie, RN, MSN, [facility] Care Management Care Management Coordinator [phone]

## 2023-01-18 DIAGNOSIS — Z7901 Long term (current) use of anticoagulants: Secondary | ICD-10-CM | POA: Diagnosis not present

## 2023-01-20 DIAGNOSIS — D6851 Activated protein C resistance: Secondary | ICD-10-CM | POA: Diagnosis not present

## 2023-01-20 DIAGNOSIS — J9611 Chronic respiratory failure with hypoxia: Secondary | ICD-10-CM | POA: Diagnosis not present

## 2023-01-20 DIAGNOSIS — U071 COVID-19: Secondary | ICD-10-CM | POA: Diagnosis not present

## 2023-01-20 DIAGNOSIS — J431 Panlobular emphysema: Secondary | ICD-10-CM | POA: Diagnosis not present

## 2023-01-23 ENCOUNTER — Encounter: Payer: Self-pay | Admitting: Physician Assistant

## 2023-02-08 DIAGNOSIS — I4821 Permanent atrial fibrillation: Secondary | ICD-10-CM | POA: Diagnosis not present

## 2023-02-08 DIAGNOSIS — Z7901 Long term (current) use of anticoagulants: Secondary | ICD-10-CM | POA: Diagnosis not present

## 2023-02-19 ENCOUNTER — Ambulatory Visit: Payer: Self-pay | Admitting: *Deleted

## 2023-02-19 NOTE — Patient Outreach (Signed)
Care Coordination   Follow Up Visit Note   02/19/2023 Name: Tyler [name] MRN: [id number] DOB: 03/25/1956  Tyler [name] is a 67 y.o. year old male who sees Gracelyn Nurse, MD for primary care. I spoke with  Arnetha Courser by phone today.  What matters to the patients health and wellness today?  Patient still considering if he will be placed on transplant list for lung.  He will discuss with his provider at [facility] within the next few weeks.     Goals Addressed             This Visit's Progress    Effective management of COPD   On track    Care Coordination Interventions: Provided patient with basic written and verbal COPD education on self care/management/and exacerbation prevention Advised patient to track and manage COPD triggers Provided instruction about proper use of medications used for management of COPD including inhalers Advised patient to self assesses COPD action plan zone and make appointment with provider if in the yellow zone for 48 hours without improvement         SDOH assessments and interventions completed:  No     Care Coordination Interventions:  Yes, provided   Interventions Today    Flowsheet Row Most Recent Value  Chronic Disease   Chronic disease during today's visit Chronic Obstructive Pulmonary Disease (COPD), Congestive Heart Failure (CHF)  General Interventions   General Interventions Discussed/Reviewed General Interventions Reviewed, Doctor Visits  Doctor Visits Discussed/Reviewed Doctor Visits Reviewed, Specialist  [Cardiology on 10/30]  PCP/Specialist Visits Compliance with follow-up visit  Education Interventions   Education Provided Provided Education  Provided Verbal Education On When to see the doctor, Medication       Follow up plan: Follow up call scheduled for 11/8    Encounter Outcome:  Patient Visit Completed   Tyler Durie, RN, MSN, [facility] Care Management Care Management Coordinator [phone]

## 2023-02-22 DIAGNOSIS — I4821 Permanent atrial fibrillation: Secondary | ICD-10-CM | POA: Diagnosis not present

## 2023-02-22 DIAGNOSIS — Z7901 Long term (current) use of anticoagulants: Secondary | ICD-10-CM | POA: Diagnosis not present

## 2023-03-08 DIAGNOSIS — I4821 Permanent atrial fibrillation: Secondary | ICD-10-CM | POA: Diagnosis not present

## 2023-03-08 DIAGNOSIS — Z7901 Long term (current) use of anticoagulants: Secondary | ICD-10-CM | POA: Diagnosis not present

## 2023-03-15 DIAGNOSIS — Z7901 Long term (current) use of anticoagulants: Secondary | ICD-10-CM | POA: Diagnosis not present

## 2023-03-15 DIAGNOSIS — I4821 Permanent atrial fibrillation: Secondary | ICD-10-CM | POA: Diagnosis not present

## 2023-03-16 DIAGNOSIS — I340 Nonrheumatic mitral (valve) insufficiency: Secondary | ICD-10-CM | POA: Diagnosis not present

## 2023-03-16 DIAGNOSIS — D6851 Activated protein C resistance: Secondary | ICD-10-CM | POA: Diagnosis not present

## 2023-03-16 DIAGNOSIS — I071 Rheumatic tricuspid insufficiency: Secondary | ICD-10-CM | POA: Diagnosis not present

## 2023-03-16 DIAGNOSIS — I10 Essential (primary) hypertension: Secondary | ICD-10-CM | POA: Diagnosis not present

## 2023-03-16 DIAGNOSIS — I5022 Chronic systolic (congestive) heart failure: Secondary | ICD-10-CM | POA: Diagnosis not present

## 2023-03-16 DIAGNOSIS — I482 Chronic atrial fibrillation, unspecified: Secondary | ICD-10-CM | POA: Diagnosis not present

## 2023-03-16 DIAGNOSIS — E782 Mixed hyperlipidemia: Secondary | ICD-10-CM | POA: Diagnosis not present

## 2023-03-16 DIAGNOSIS — Z95810 Presence of automatic (implantable) cardiac defibrillator: Secondary | ICD-10-CM | POA: Diagnosis not present

## 2023-03-16 DIAGNOSIS — I2510 Atherosclerotic heart disease of native coronary artery without angina pectoris: Secondary | ICD-10-CM | POA: Diagnosis not present

## 2023-03-31 DIAGNOSIS — Z95810 Presence of automatic (implantable) cardiac defibrillator: Secondary | ICD-10-CM | POA: Diagnosis not present

## 2023-03-31 DIAGNOSIS — I5022 Chronic systolic (congestive) heart failure: Secondary | ICD-10-CM | POA: Diagnosis not present

## 2023-04-02 ENCOUNTER — Inpatient Hospital Stay
Admission: EM | Admit: 2023-04-02 | Discharge: 2023-04-12 | DRG: 177 | Disposition: A | Discharge status: Hospice | Payer: Medicare HMO | Attending: Internal Medicine | Admitting: Internal Medicine

## 2023-04-02 ENCOUNTER — Encounter: Payer: Medicare HMO | Admitting: *Deleted

## 2023-04-02 ENCOUNTER — Emergency Department: Payer: Medicare HMO

## 2023-04-02 ENCOUNTER — Other Ambulatory Visit: Payer: Self-pay

## 2023-04-02 DIAGNOSIS — J9601 Acute respiratory failure with hypoxia: Secondary | ICD-10-CM | POA: Diagnosis not present

## 2023-04-02 DIAGNOSIS — Z89611 Acquired absence of right leg above knee: Secondary | ICD-10-CM

## 2023-04-02 DIAGNOSIS — J811 Chronic pulmonary edema: Secondary | ICD-10-CM | POA: Diagnosis not present

## 2023-04-02 DIAGNOSIS — R0902 Hypoxemia: Secondary | ICD-10-CM | POA: Diagnosis not present

## 2023-04-02 DIAGNOSIS — I2510 Atherosclerotic heart disease of native coronary artery without angina pectoris: Secondary | ICD-10-CM | POA: Diagnosis not present

## 2023-04-02 DIAGNOSIS — Z95810 Presence of automatic (implantable) cardiac defibrillator: Secondary | ICD-10-CM

## 2023-04-02 DIAGNOSIS — I252 Old myocardial infarction: Secondary | ICD-10-CM | POA: Diagnosis not present

## 2023-04-02 DIAGNOSIS — Z9981 Dependence on supplemental oxygen: Secondary | ICD-10-CM

## 2023-04-02 DIAGNOSIS — I110 Hypertensive heart disease with heart failure: Secondary | ICD-10-CM | POA: Diagnosis not present

## 2023-04-02 DIAGNOSIS — Z23 Encounter for immunization: Secondary | ICD-10-CM | POA: Diagnosis not present

## 2023-04-02 DIAGNOSIS — Z66 Do not resuscitate: Secondary | ICD-10-CM | POA: Diagnosis not present

## 2023-04-02 DIAGNOSIS — J441 Chronic obstructive pulmonary disease with (acute) exacerbation: Secondary | ICD-10-CM | POA: Diagnosis not present

## 2023-04-02 DIAGNOSIS — E785 Hyperlipidemia, unspecified: Secondary | ICD-10-CM | POA: Diagnosis present

## 2023-04-02 DIAGNOSIS — I081 Rheumatic disorders of both mitral and tricuspid valves: Secondary | ICD-10-CM | POA: Diagnosis not present

## 2023-04-02 DIAGNOSIS — Z7901 Long term (current) use of anticoagulants: Secondary | ICD-10-CM

## 2023-04-02 DIAGNOSIS — Z79899 Other long term (current) drug therapy: Secondary | ICD-10-CM

## 2023-04-02 DIAGNOSIS — Z888 Allergy status to other drugs, medicaments and biological substances status: Secondary | ICD-10-CM

## 2023-04-02 DIAGNOSIS — I739 Peripheral vascular disease, unspecified: Secondary | ICD-10-CM | POA: Diagnosis present

## 2023-04-02 DIAGNOSIS — Z515 Encounter for palliative care: Secondary | ICD-10-CM | POA: Diagnosis not present

## 2023-04-02 DIAGNOSIS — Z7189 Other specified counseling: Secondary | ICD-10-CM | POA: Diagnosis not present

## 2023-04-02 DIAGNOSIS — J9621 Acute and chronic respiratory failure with hypoxia: Secondary | ICD-10-CM | POA: Diagnosis present

## 2023-04-02 DIAGNOSIS — I5022 Chronic systolic (congestive) heart failure: Secondary | ICD-10-CM | POA: Diagnosis not present

## 2023-04-02 DIAGNOSIS — E871 Hypo-osmolality and hyponatremia: Secondary | ICD-10-CM | POA: Diagnosis present

## 2023-04-02 DIAGNOSIS — G4733 Obstructive sleep apnea (adult) (pediatric): Secondary | ICD-10-CM | POA: Diagnosis present

## 2023-04-02 DIAGNOSIS — R059 Cough, unspecified: Secondary | ICD-10-CM | POA: Diagnosis not present

## 2023-04-02 DIAGNOSIS — Z809 Family history of malignant neoplasm, unspecified: Secondary | ICD-10-CM

## 2023-04-02 DIAGNOSIS — N400 Enlarged prostate without lower urinary tract symptoms: Secondary | ICD-10-CM | POA: Diagnosis present

## 2023-04-02 DIAGNOSIS — J439 Emphysema, unspecified: Secondary | ICD-10-CM | POA: Diagnosis not present

## 2023-04-02 DIAGNOSIS — K219 Gastro-esophageal reflux disease without esophagitis: Secondary | ICD-10-CM | POA: Diagnosis present

## 2023-04-02 DIAGNOSIS — U071 COVID-19: Principal | ICD-10-CM

## 2023-04-02 DIAGNOSIS — F419 Anxiety disorder, unspecified: Secondary | ICD-10-CM | POA: Diagnosis present

## 2023-04-02 DIAGNOSIS — Z86718 Personal history of other venous thrombosis and embolism: Secondary | ICD-10-CM

## 2023-04-02 DIAGNOSIS — Z7984 Long term (current) use of oral hypoglycemic drugs: Secondary | ICD-10-CM

## 2023-04-02 DIAGNOSIS — Z87891 Personal history of nicotine dependence: Secondary | ICD-10-CM

## 2023-04-02 DIAGNOSIS — Z86711 Personal history of pulmonary embolism: Secondary | ICD-10-CM

## 2023-04-02 DIAGNOSIS — R0602 Shortness of breath: Secondary | ICD-10-CM | POA: Diagnosis not present

## 2023-04-02 DIAGNOSIS — I951 Orthostatic hypotension: Secondary | ICD-10-CM | POA: Diagnosis present

## 2023-04-02 DIAGNOSIS — I480 Paroxysmal atrial fibrillation: Secondary | ICD-10-CM | POA: Diagnosis not present

## 2023-04-02 DIAGNOSIS — D6851 Activated protein C resistance: Secondary | ICD-10-CM | POA: Diagnosis present

## 2023-04-02 DIAGNOSIS — Z8249 Family history of ischemic heart disease and other diseases of the circulatory system: Secondary | ICD-10-CM

## 2023-04-02 DIAGNOSIS — I10 Essential (primary) hypertension: Secondary | ICD-10-CM | POA: Diagnosis not present

## 2023-04-02 DIAGNOSIS — Z7982 Long term (current) use of aspirin: Secondary | ICD-10-CM

## 2023-04-02 DIAGNOSIS — Z7951 Long term (current) use of inhaled steroids: Secondary | ICD-10-CM

## 2023-04-02 DIAGNOSIS — Z89512 Acquired absence of left leg below knee: Secondary | ICD-10-CM | POA: Diagnosis not present

## 2023-04-02 DIAGNOSIS — Z955 Presence of coronary angioplasty implant and graft: Secondary | ICD-10-CM

## 2023-04-02 DIAGNOSIS — R079 Chest pain, unspecified: Secondary | ICD-10-CM | POA: Diagnosis not present

## 2023-04-02 DIAGNOSIS — R062 Wheezing: Secondary | ICD-10-CM | POA: Diagnosis not present

## 2023-04-02 DIAGNOSIS — G4730 Sleep apnea, unspecified: Secondary | ICD-10-CM | POA: Diagnosis present

## 2023-04-02 LAB — CBC
HCT: 37.7 % — ABNORMAL LOW (ref 39.0–52.0)
Hemoglobin: 12.5 g/dL — ABNORMAL LOW (ref 13.0–17.0)
MCH: 31.2 pg (ref 26.0–34.0)
MCHC: 33.2 g/dL (ref 30.0–36.0)
MCV: 94 fL (ref 80.0–100.0)
Platelets: 241 10*3/uL (ref 150–400)
RBC: 4.01 MIL/uL — ABNORMAL LOW (ref 4.22–5.81)
RDW: 13.8 % (ref 11.5–15.5)
WBC: 12.9 10*3/uL — ABNORMAL HIGH (ref 4.0–10.5)
nRBC: 0 % (ref 0.0–0.2)

## 2023-04-02 LAB — TROPONIN I (HIGH SENSITIVITY)
Troponin I (High Sensitivity): 22 ng/L — ABNORMAL HIGH (ref <18)
Troponin I (High Sensitivity): 20 ng/L — ABNORMAL HIGH (ref <18)

## 2023-04-02 LAB — BASIC METABOLIC PANEL
Anion gap: 12 (ref 5–15)
BUN: 11 mg/dL (ref 8–23)
CO2: 29 mmol/L (ref 22–32)
Calcium: 8.8 mg/dL — ABNORMAL LOW (ref 8.9–10.3)
Chloride: 89 mmol/L — ABNORMAL LOW (ref 98–111)
Creatinine, Ser: 0.71 mg/dL (ref 0.61–1.24)
GFR, Estimated: >60 mL/min (ref >60)
Glucose, Bld: 113 mg/dL — ABNORMAL HIGH (ref 70–99)
Potassium: 3.6 mmol/L (ref 3.5–5.1)
Sodium: 130 mmol/L — ABNORMAL LOW (ref 135–145)

## 2023-04-02 LAB — PROTIME-INR
INR: 2.4 — ABNORMAL HIGH (ref 0.8–1.2)
Prothrombin Time: 26.6 s — ABNORMAL HIGH (ref 11.4–15.2)

## 2023-04-02 LAB — RESP PANEL BY RT-PCR (FLU A&B, COVID) ARPGX2
Influenza A by PCR: NEGATIVE
Influenza B by PCR: NEGATIVE
SARS Coronavirus 2 by RT PCR: POSITIVE — AB

## 2023-04-02 LAB — PROCALCITONIN: Procalcitonin: <0.1 ng/mL

## 2023-04-02 LAB — BRAIN NATRIURETIC PEPTIDE: B Natriuretic Peptide: 90.7 pg/mL (ref 0.0–100.0)

## 2023-04-02 MED ORDER — PRAVASTATIN SODIUM 20 MG PO TABS
20.0000 mg | ORAL_TABLET | Freq: Every day | ORAL | Status: DC
  Administered 2023-04-03 – 2023-04-11 (×8): 20 mg via ORAL
  Filled 2023-04-02 (×8): qty 1

## 2023-04-02 MED ORDER — IPRATROPIUM-ALBUTEROL 0.5-2.5 (3) MG/3ML IN SOLN
3.0000 mL | Freq: Once | RESPIRATORY_TRACT | Status: AC
  Administered 2023-04-02: 3 mL via RESPIRATORY_TRACT
  Filled 2023-04-02: qty 3

## 2023-04-02 MED ORDER — PANTOPRAZOLE SODIUM 40 MG PO TBEC
40.0000 mg | DELAYED_RELEASE_TABLET | Freq: Every day | ORAL | Status: DC
  Administered 2023-04-03 – 2023-04-12 (×10): 40 mg via ORAL
  Filled 2023-04-02 (×10): qty 1

## 2023-04-02 MED ORDER — WARFARIN SODIUM 4 MG PO TABS
4.0000 mg | ORAL_TABLET | Freq: Once | ORAL | Status: AC
  Administered 2023-04-02: 4 mg via ORAL
  Filled 2023-04-02: qty 1

## 2023-04-02 MED ORDER — METOPROLOL SUCCINATE ER 25 MG PO TB24
25.0000 mg | ORAL_TABLET | Freq: Every day | ORAL | Status: DC
  Administered 2023-04-03 – 2023-04-05 (×3): 25 mg via ORAL
  Filled 2023-04-02 (×3): qty 1

## 2023-04-02 MED ORDER — PREDNISONE 20 MG PO TABS: 60.0000 mg | ORAL_TABLET | Freq: Every day | ORAL | Status: DC

## 2023-04-02 MED ORDER — MORPHINE SULFATE (PF) 2 MG/ML IV SOLN
2.0000 mg | Freq: Once | INTRAVENOUS | Status: AC
  Administered 2023-04-02: 2 mg via INTRAVENOUS
  Filled 2023-04-02: qty 1

## 2023-04-02 MED ORDER — ALBUTEROL SULFATE (2.5 MG/3ML) 0.083% IN NEBU
5.0000 mg | INHALATION_SOLUTION | RESPIRATORY_TRACT | Status: AC
  Administered 2023-04-02: 5 mg via RESPIRATORY_TRACT
  Filled 2023-04-02: qty 6

## 2023-04-02 MED ORDER — SACUBITRIL-VALSARTAN 24-26 MG PO TABS
1.0000 | ORAL_TABLET | Freq: Two times a day (BID) | ORAL | Status: DC
  Administered 2023-04-03 – 2023-04-05 (×5): 1 via ORAL
  Filled 2023-04-02 (×6): qty 1

## 2023-04-02 MED ORDER — FUROSEMIDE 20 MG PO TABS
20.0000 mg | ORAL_TABLET | Freq: Every day | ORAL | Status: DC
  Administered 2023-04-03 – 2023-04-05 (×3): 20 mg via ORAL
  Filled 2023-04-02 (×3): qty 1

## 2023-04-02 MED ORDER — TAMSULOSIN HCL 0.4 MG PO CAPS
0.4000 mg | ORAL_CAPSULE | Freq: Every day | ORAL | Status: DC
  Administered 2023-04-03 – 2023-04-12 (×10): 0.4 mg via ORAL
  Filled 2023-04-02 (×10): qty 1

## 2023-04-02 MED ORDER — DEXTROSE 5 % IV SOLN
500.0000 mg | Freq: Once | INTRAVENOUS | Status: AC
  Administered 2023-04-02: 500 mg via INTRAVENOUS
  Filled 2023-04-02: qty 5

## 2023-04-02 MED ORDER — BUDESONIDE 0.5 MG/2ML IN SUSP
2.0000 mg | Freq: Two times a day (BID) | RESPIRATORY_TRACT | Status: DC
  Administered 2023-04-03: 0.5 mg via RESPIRATORY_TRACT
  Filled 2023-04-02: qty 8

## 2023-04-02 MED ORDER — IPRATROPIUM-ALBUTEROL 0.5-2.5 (3) MG/3ML IN SOLN
3.0000 mL | Freq: Four times a day (QID) | RESPIRATORY_TRACT | Status: DC
  Administered 2023-04-02 – 2023-04-12 (×40): 3 mL via RESPIRATORY_TRACT
  Filled 2023-04-02 (×39): qty 3

## 2023-04-02 MED ORDER — METHYLPREDNISOLONE SODIUM SUCC 125 MG IJ SOLR
125.0000 mg | INTRAMUSCULAR | Status: AC
  Administered 2023-04-02: 125 mg via INTRAVENOUS
  Filled 2023-04-02: qty 2

## 2023-04-02 MED ORDER — GABAPENTIN 300 MG PO CAPS
300.0000 mg | ORAL_CAPSULE | Freq: Three times a day (TID) | ORAL | Status: DC
  Administered 2023-04-02 – 2023-04-12 (×29): 300 mg via ORAL
  Filled 2023-04-02 (×29): qty 1

## 2023-04-02 MED ORDER — CITALOPRAM HYDROBROMIDE 20 MG PO TABS
20.0000 mg | ORAL_TABLET | Freq: Every day | ORAL | Status: DC
  Administered 2023-04-03 – 2023-04-12 (×10): 20 mg via ORAL
  Filled 2023-04-02 (×10): qty 1

## 2023-04-02 MED ORDER — ALPRAZOLAM 0.5 MG PO TABS
1.0000 mg | ORAL_TABLET | Freq: Two times a day (BID) | ORAL | Status: DC | PRN
  Administered 2023-04-02 – 2023-04-12 (×17): 1 mg via ORAL
  Filled 2023-04-02 (×17): qty 2

## 2023-04-02 MED ORDER — WARFARIN - PHARMACIST DOSING INPATIENT
Freq: Every day | Status: DC
  Filled 2023-04-02: qty 1

## 2023-04-02 MED ORDER — OXYCODONE-ACETAMINOPHEN 7.5-325 MG PO TABS
1.0000 | ORAL_TABLET | Freq: Four times a day (QID) | ORAL | Status: DC | PRN
Start: 2023-04-02 — End: 2023-04-12
  Administered 2023-04-02: 1 via ORAL
  Administered 2023-04-03: 2 via ORAL
  Administered 2023-04-03 – 2023-04-04 (×3): 1 via ORAL
  Administered 2023-04-04: 2 via ORAL
  Administered 2023-04-05 (×3): 1 via ORAL
  Administered 2023-04-06 (×2): 2 via ORAL
  Administered 2023-04-07: 1 via ORAL
  Administered 2023-04-07: 2 via ORAL
  Administered 2023-04-08 – 2023-04-10 (×3): 1 via ORAL
  Administered 2023-04-10: 2 via ORAL
  Administered 2023-04-11 – 2023-04-12 (×3): 1 via ORAL
  Filled 2023-04-02: qty 2
  Filled 2023-04-02 (×2): qty 1
  Filled 2023-04-02 (×2): qty 2
  Filled 2023-04-02: qty 1
  Filled 2023-04-02: qty 2
  Filled 2023-04-02 (×2): qty 1
  Filled 2023-04-02 (×2): qty 2
  Filled 2023-04-02 (×2): qty 1
  Filled 2023-04-02 (×2): qty 2
  Filled 2023-04-02 (×5): qty 1
  Filled 2023-04-02 (×2): qty 2

## 2023-04-02 MED ORDER — ASPIRIN 81 MG PO TBEC
81.0000 mg | DELAYED_RELEASE_TABLET | Freq: Every day | ORAL | Status: DC
  Administered 2023-04-03 – 2023-04-11 (×8): 81 mg via ORAL
  Filled 2023-04-02 (×8): qty 1

## 2023-04-02 MED ORDER — SODIUM CHLORIDE 0.9% FLUSH
3.0000 mL | Freq: Two times a day (BID) | INTRAVENOUS | Status: DC
  Administered 2023-04-02 – 2023-04-12 (×19): 3 mL via INTRAVENOUS

## 2023-04-02 MED ORDER — DOCUSATE SODIUM 100 MG PO CAPS: 100.0000 mg | ORAL_CAPSULE | Freq: Every day | ORAL | Status: DC | PRN

## 2023-04-02 MED ORDER — IPRATROPIUM-ALBUTEROL 0.5-2.5 (3) MG/3ML IN SOLN
RESPIRATORY_TRACT | Status: AC
  Administered 2023-04-02: 3 mL via RESPIRATORY_TRACT
  Filled 2023-04-02: qty 3

## 2023-04-02 NOTE — Consult Note (Signed)
Pharmacy Consult Note - Anticoagulation  Pharmacy Consult for warfarin Indication: atrial fibrillation  PATIENT MEASUREMENTS: Height: 6\' 1"  (185.4 cm) Weight: 73.5 kg (162 lb) IBW/kg (Calculated) : 79.9 HEPARIN DW (KG): 73.5  VITAL SIGNS: Temp: 98.3 F (36.8 C) (11/08 1219) Temp Source: Oral (11/08 1219) BP: 104/65 (11/08 1600) Pulse Rate: 74 (11/08 1600)  Recent Labs    04/02/23 1225 04/02/23 1441  HGB 12.5*  --   HCT 37.7*  --   PLT 241  --   LABPROT 26.6*  --   INR 2.4*  --   CREATININE 0.71  --   TROPONINIHS 22* 20*    Estimated Creatinine Clearance: 93.2 mL/min (by C-G formula based on SCr of 0.71 mg/dL).  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   Atrial fibrillation (HCC)    Cervical spinal stenosis    with neuropathy   CHF (congestive heart failure) (HCC)    Constipation    COPD (chronic obstructive pulmonary disease) (HCC)    COPD with acute exacerbation (HCC) 10/13/2016   Coronary artery disease    Depression    Dyspnea    Dysrhythmia    atrial fibrillation   Emphysema of lung (HCC)    Factor 5 Leiden mutation, heterozygous (HCC)    on coumadin   Factor V Leiden mutation (HCC)    GERD (gastroesophageal reflux disease)    Hypertension    Lung nodule seen on imaging study    being followed by dr. Belia Heman. just watching it for last few years, without change   Mitral valve insufficiency    Moderate tricuspid insufficiency    Myocardial infarction [facility]) 2004   stent placed, pacemaker implanted 2005   Osteomyelitis (HCC) 10/2019   left foot   Oxygen dependent    requires 2L nasal prong oxygen 24 hours a day   Peripheral vascular disease (HCC)    Presence of permanent cardiac pacemaker [phone]   PVD  (peripheral vascular disease) (HCC)    Sleep apnea    waiting to have sleep study. Used bipap while hospitalized and said it was great for him.    ASSESSMENT: 67 y.o. male with PMH Afib, COPD is presenting with chest pain and cough. Patient is on chronic anticoagulation with warfarin for Afib. HR currently controlled and CHADSVASc is at least 4 (age, CHF, HTN, CAD). Pharmacy has been consulted to initiate and manage heparin intravenous infusion.  Pertinent medications: Warfarin 4 mg daily Total weekly warfarin: 28 mg Last dose on 11/7  Interacting medications: n/a   Goal(s) of therapy: INR 2 - 3 Monitor platelets by anticoagulation protocol: Yes   Baseline anticoagulation labs: Recent Labs    04/02/23 1225  INR 2.4*  HGB 12.5*  PLT 241    Date INR Warfarin Dose 11/08 2.4 4 mg  PLAN: Give warfarin 4 mg PO x 1 today, in accordance with home regimen Daily INR Continue to monitor for new-start medications that may interact with warfarin Check CBC at least every 7 days for inpatients on warfarin   Will M. Dareen Piano, PharmD Clinical Pharmacist 04/02/2023 5:12 PM

## 2023-04-02 NOTE — Assessment & Plan Note (Deleted)
Patient presenting with worsening hypoxia requiring temporary BiPAP and increased supplemental oxygen compared to baseline. Likely 2/2 COPD exacerbation. No evidence of hypervolemia on examination and BNP is WNL.   - Continuous pulse oximetry - Continue supplemental oxygen to maintain oxygen saturation above 88% - BiPAP as needed for increased work of breathing, and at bedtime

## 2023-04-02 NOTE — H&P (Signed)
History and Physical    Patient: Tyler [name]:[id number] DOB: [birth year] DOA: 04/02/2023 DOS: the patient was seen and examined on 04/02/2023 PCP: Gracelyn Nurse, MD  Patient coming from: Home  Chief Complaint:  Chief Complaint  Patient presents with   Respiratory Distress   HPI: Tyler [name] is a 67 y.o. male with medical history significant of chronic hypoxic respiratory failure on 3L supplemental oxygen, COPD, bronchiectasis, atrial fibrillation on Coumadin, OSA on BiPAP, HFrEF with last EF of 45-50% s/p AICD (2005), CAD s/p DES to LAD (2014), PAD< hypertension, factor V leiden mutation, hyperlipidemia, Who presents to the ED due to shortness of breath.  Tyler [name] states that for the last several days, he has been experiencing both shortness of breath and productive cough with green sputum that have been progressively worsening.  He has had increased work of breathing for the last 3 days that has not improved with his home treatments.  He denies any fever, chills, nausea, vomiting, diarrhea he has occasional left-sided chest pain below his breast but it only last 10 to 20 minutes and he believes it may be due to indigestion.   ED course: On arrival to the ED, patient was normotensive at 117/98 with heart rate of 79.  He was saturating at 80% on EMS arrival, temporarily placed on BiPAP, and then transition to nasal cannula at 4 L with saturation of 99%.Initial workup notable for WBC of 12.9, hemoglobin of 12.5, sodium 130,Creatinine 0.71 GFR above 60.  BNP within normal limits, troponin mildly elevated at 22.  INR 2.4.Chest x-ray was obtained and results are pending.Patient started on Solu-Medrol and DuoNebs.  TRH contacted patient started on Solu-Medrol and DuoNebs.  TRH contacted for admission.  Review of Systems: As mentioned in the history of present illness. All other systems reviewed and are negative.  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   Atrial fibrillation (HCC)    Cervical spinal stenosis    with neuropathy   CHF (congestive heart failure) (HCC)    Constipation    COPD (chronic obstructive pulmonary disease) (HCC)    COPD with acute exacerbation (HCC) 10/13/2016   Coronary artery disease    Depression    Dyspnea    Dysrhythmia    atrial fibrillation   Emphysema of lung (HCC)    Factor 5 Leiden mutation, heterozygous (HCC)    on coumadin   Factor V Leiden mutation (HCC)    GERD (gastroesophageal reflux disease)    Hypertension    Lung nodule seen on imaging study    being followed by dr. Belia Heman. just watching it for last few years, without change   Mitral valve insufficiency    Moderate tricuspid insufficiency    Myocardial infarction [facility]) 2004   stent placed, pacemaker implanted 2005   Osteomyelitis (HCC) 10/2019   left foot   Oxygen dependent    requires 2L nasal prong oxygen 24 hours a day   Peripheral vascular disease (HCC)    Presence of permanent cardiac pacemaker [phone]   PVD (peripheral vascular disease) (HCC)    Sleep apnea    waiting to have sleep study. Used bipap while hospitalized and said it was great for him.   Past Surgical History:  Procedure Laterality Date  ABOVE KNEE LEG AMPUTATION Right    after below the knee amputation    AMPUTATION Left 11/22/2019   Procedure: AMPUTATION BELOW KNEE;  Surgeon: Annice Needy, MD;  Location: [facility] OR[facility];  Service: Vascular;  Laterality: Left;   AMPUTATION TOE Left 08/04/2019   Procedure: AMPUTATION TOE MPJ LEFT;  Surgeon: Gwyneth Revels, DPM;  Location: [facility];  Service: Podiatry;  Laterality: Left;   AMPUTATION TOE Left 10/06/2019   Procedure: AMPUTATION TOE MPJ T1,T2 LEFT;  Surgeon: Gwyneth Revels, DPM;  Location: [facility];  Service: Podiatry;   Laterality: Left;   APPLICATION OF WOUND VAC Left 01/12/2018   Procedure: APPLICATION OF WOUND VAC;  Surgeon: Annice Needy, MD;  Location: [facility];  Service: Vascular;  Laterality: Left;   BELOW KNEE LEG AMPUTATION Right    CATARACT EXTRACTION, BILATERAL Bilateral    COLONOSCOPY N/A 02/19/2020   Procedure: COLONOSCOPY;  Surgeon: Regis Bill, MD;  Location: [facility];  Service: Endoscopy;  Laterality: N/A;   COLONOSCOPY WITH PROPOFOL N/A 08/27/2020   Procedure: COLONOSCOPY WITH PROPOFOL;  Surgeon: Regis Bill, MD;  Location: [facility];  Service: Endoscopy;  Laterality: N/A;  REQ MID AM   CORONARY ANGIOPLASTY  2005   stent x 1 placed   ENDARTERECTOMY FEMORAL Left 08/11/2017   Procedure: ENDARTERECTOMY FEMORAL;  Surgeon: Annice Needy, MD;  Location: [facility];  Service: Vascular;  Laterality: Left;   ESOPHAGOGASTRODUODENOSCOPY N/A 02/19/2020   Procedure: ESOPHAGOGASTRODUODENOSCOPY (EGD);  Surgeon: Regis Bill, MD;  Location: [facility];  Service: Endoscopy;  Laterality: N/A;   HEMATOMA EVACUATION Left 01/12/2018   Procedure: EVACUATION HEMATOMA ( DRAINING OF SEROMA);  Surgeon: Annice Needy, MD;  Location: [facility];  Service: Vascular;  Laterality: Left;   IMPLANTABLE CARDIOVERTER DEFIBRILLATOR (ICD) GENERATOR CHANGE Left 02/10/2017   Procedure: ICD GENERATOR CHANGE;  Surgeon: Marcina Millard, MD;  Location: [facility];  Service: Cardiovascular;  Laterality: Left;   INSERT / REPLACE / REMOVE PACEMAKER  [id number], 2018   LOWER EXTREMITY ANGIOGRAPHY Left 06/07/2017   Procedure: LOWER EXTREMITY ANGIOGRAPHY;  Surgeon: Annice Needy, MD;  Location: [facility];  Service: Cardiovascular;  Laterality: Left;   LOWER EXTREMITY ANGIOGRAPHY Left 10/05/2019   Procedure: LOWER EXTREMITY ANGIOGRAPHY;  Surgeon: Annice Needy, MD;  Location: [facility];  Service: Cardiovascular;  Laterality: Left;   LOWER EXTREMITY INTERVENTION  06/07/2017   Procedure: LOWER  EXTREMITY INTERVENTION;  Surgeon: Annice Needy, MD;  Location: [facility];  Service: Cardiovascular;;   PERIPHERAL VASCULAR CATHETERIZATION Left 12/09/2015   Procedure: Lower Extremity Angiography;  Surgeon: Annice Needy, MD;  Location: [facility];  Service: Cardiovascular;  Laterality: Left;   PERIPHERAL VASCULAR CATHETERIZATION  12/09/2015   Procedure: Lower Extremity Intervention;  Surgeon: Annice Needy, MD;  Location: [facility];  Service: Cardiovascular;;   TONSILLECTOMY     TRANSMETATARSAL AMPUTATION Left 10/20/2019   Procedure: TRANSMETATARSAL AMPUTATION;  Surgeon: Linus Galas, DPM;  Location: [facility];  Service: Podiatry;  Laterality: Left;   Social History:  reports that he quit smoking about 9 years ago. His smoking use included cigarettes. He started smoking about 51 years ago. He has a 42 pack-year smoking history. He has never used smokeless tobacco. He reports that he does not drink alcohol and does not use drugs.  Allergies  Allergen Reactions   Apixaban Rash   Rivaroxaban Rash    Family History  Problem Relation Age of Onset  Cancer Mother    Cancer Father    Heart disease Father     Prior to Admission medications   Medication Sig Start Date End Date Taking? Authorizing Provider  acetaminophen (TYLENOL) 500 MG tablet Take 1,000 mg by mouth daily as needed for moderate pain or headache.    [provider]  ALPRAZolam Prudy Feeler) 1 MG tablet Take 1 mg by mouth 2 (two) times daily as needed for anxiety or sleep.     [provider]  aspirin EC 81 MG tablet Take 1 tablet (81 mg total) by mouth daily. Patient taking differently: Take 81 mg by mouth daily after supper. 12/09/15   Annice Needy, MD  budesonide (PULMICORT) 0.5 MG/2ML nebulizer solution Take 2 mLs (0.5 mg total) by nebulization 2 (two) times daily. 03/13/22   Rai, Delene Ruffini, MD  citalopram (CELEXA) 20 MG tablet Take 20 mg by mouth daily. 02/25/22   [provider]   docusate sodium (COLACE) 100 MG capsule Take 100 mg by mouth daily as needed (for constipation.).     [provider]  Dupilumab (DUPIXENT) 300 MG/2ML SOPN Inject 300 mg into the skin every 14 (fourteen) days. Inject 2 mLs (300mg ).    [provider]  ENTRESTO 24-26 MG Take 1 tablet by mouth 2 (two) times daily. 11/03/19   Clarisa Kindred A, FNP  EPINEPHrine 0.3 mg/0.3 mL IJ SOAJ injection Inject into the muscle. 05/28/20   [provider]  fluticasone-salmeterol (ADVAIR HFA) 115-21 MCG/ACT inhaler USE 2 INHALATIONS ORALLY EVERY 12 HOURS Patient taking differently: Inhale 2 puffs into the lungs every 12 (twelve) hours. 05/30/18   Erin Fulling, MD  furosemide (LASIX) 20 MG tablet Take 1 tablet (20 mg total) by mouth daily. 03/13/22   Rai, Delene Ruffini, MD  gabapentin (NEURONTIN) 100 MG capsule Take 300 mg by mouth in the morning, at noon, and at bedtime. 09/20/19   [provider]  guaiFENesin (MUCINEX) 600 MG 12 hr tablet Take 1 tablet (600 mg total) by mouth every evening. 03/13/22   Rai, Ripudeep K, MD  ipratropium (ATROVENT) 0.03 % nasal spray Place 2 sprays into both nostrils 2 (two) times daily.     [provider]  ipratropium (ATROVENT) 0.06 % nasal spray Place 1-2 sprays into both nostrils 3 (three) times daily as needed. 02/09/22   [provider]  Ipratropium-Albuterol (COMBIVENT) 20-100 MCG/ACT AERS respimat Inhale 1 puff into the lungs every 4 (four) hours.    [provider]  ipratropium-albuterol (DUONEB) 0.5-2.5 (3) MG/3ML SOLN Take 3 mLs by nebulization 4 (four) times daily. 03/13/22   Rai, Delene Ruffini, MD  JARDIANCE 10 MG TABS tablet Take 10 mg by mouth daily with breakfast. 04/10/20   [provider]  lovastatin (MEVACOR) 20 MG tablet Take 1 tablet by mouth daily. 09/11/20   [provider]  metoprolol succinate (TOPROL-XL) 25 MG 24 hr tablet Take 25 mg by mouth daily.    [provider]  morphine (MS  CONTIN) 15 MG 12 hr tablet Take 15 mg by mouth daily as needed. 01/23/22   [provider]  naloxone [facility]) nasal spray 4 mg/0.1 mL SMARTSIG:Both Nares 12/10/21   [provider]  nitroGLYCERIN (NITROSTAT) 0.4 MG SL tablet Place 0.4 mg under the tongue every 5 (five) minutes as needed for chest pain.    [provider]  oxyCODONE-acetaminophen (PERCOCET) 7.5-325 MG tablet Take 1-2 tablets by mouth every 6 (six) hours as needed for moderate pain. 11/30/19  Stegmayer, Cala Bradford A, PA-C  pantoprazole (PROTONIX) 40 MG tablet Take 40 mg by mouth daily before breakfast.     [provider]  predniSONE (DELTASONE) 10 MG tablet Prednisone dosing: Take  Prednisone 40mg  (4 tabs) x 3 days, then taper to 30mg  (3 tabs) x 3 days, then 20mg  (2 tabs) x 3days, then continue 10mg  daily 03/13/22   Rai, Delene Ruffini, MD  Respiratory Therapy Supplies (FLUTTER) DEVI Use as directed. 05/15/19   Bevelyn Ngo, NP  sildenafil (VIAGRA) 25 MG tablet Take by mouth. 12/20/19 01/19/20  [provider]  vitamin B-12 (CYANOCOBALAMIN) 1000 MCG tablet Take 1,000 mcg by mouth daily.    [provider]  warfarin (COUMADIN) 4 MG tablet Take 4 mg by mouth daily. 12/23/21   [provider]    Physical Exam: Vitals:   04/02/23 1219 04/02/23 1230  BP: 111/81 (!) 117/98  Pulse: 76 78  Resp: (!) 27 (!) 26  Temp: 98.3 F (36.8 C)   TempSrc: Oral   SpO2: 99% 99%  Weight: 73.5 kg   Height: 6\' 1"  (1.854 m)    Physical Exam Vitals and nursing note reviewed.  Constitutional:      General: He is not in acute distress.    Appearance: He is normal weight. He is not toxic-appearing.  HENT:     Head: Normocephalic and atraumatic.     Mouth/Throat:     Mouth: Mucous membranes are dry.     Pharynx: Oropharynx is clear.  Eyes:     Conjunctiva/sclera: Conjunctivae normal.     Pupils: Pupils are equal, round, and reactive to light.  Cardiovascular:     Rate and Rhythm: Normal rate  and regular rhythm.     Heart sounds: No murmur heard.    No gallop.  Pulmonary:     Effort: Tachypnea, accessory muscle usage and prolonged expiration present.     Breath sounds: Wheezing (Significant diffuse wheezing) present. No decreased breath sounds, rhonchi or rales.  Abdominal:     General: Bowel sounds are normal. There is no distension.     Palpations: Abdomen is soft.     Tenderness: There is no abdominal tenderness. There is no guarding.  Musculoskeletal:     Comments: Bilateral amputations   Skin:    General: Skin is warm and dry.  Neurological:     Mental Status: He is alert and oriented to person, place, and time. Mental status is at baseline.  Psychiatric:        Mood and Affect: Mood normal.        Behavior: Behavior normal.    Data Reviewed: CBC with WBC of 12.9, hemoglobin of 12.5, platelets of 241 BMP with sodium of 130, potassium 3.6, bicarb 29, glucose 113, BUN 11, creatinine 0.71 with GFR above 60 BNP within normal limits at 90.7 Troponin 20 to downtrend to 20 INR 2.4  EKG personally reviewed.  Right bundle branch block noted, due to motion artifact, P waves are difficult to note, however rhythm is regular.  DG Chest Port 1 View  Result Date: 04/02/2023 CLINICAL DATA:  Shortness of breath. EXAM: PORTABLE CHEST 1 VIEW COMPARISON:  March 10, 2022. FINDINGS: Stable cardiomediastinal silhouette. Left-sided defibrillator is unchanged in position. Emphysematous disease is noted in the upper lobes. No acute pulmonary disease. Bony thorax is unremarkable. IMPRESSION: No active disease. Emphysema (ICD10-J43.9). Electronically Signed   By: Lupita Raider M.D.   On: 04/02/2023 15:15    Results are pending, will review  when available.  Assessment and Plan:  * Acute hypoxic respiratory failure (HCC) Patient presenting with worsening hypoxia requiring temporary BiPAP and increased supplemental oxygen compared to baseline. Likely 2/2 COPD exacerbation. No evidence  of hypervolemia on examination and BNP is WNL.   - Continuous pulse oximetry - Continue supplemental oxygen to maintain oxygen saturation above 88% - BiPAP as needed for increased work of breathing, and at bedtime  COPD with acute exacerbation (HCC) Several day history of increased shortness of breath, cough and sputum production consistent with a COPD exacerbation.  Chest x-ray appears clear on personal review.  - S/p Solu-Medrol 125 mg once - Start prednisone 60 mg daily tomorrow - Hold home Decadron - RVP and procalcitonin pending - DuoNebs every 6 hours - Pulmicort BID  Chronic systolic CHF (congestive heart failure), NYHA class 3 (HCC) Patient has a history of HFrEF with a EF as low as 30% with improvement to 35-40% on most recent echo.  No evidence of hypervolemia on examination at this time.  - Continue home regimen - Daily weights  AF (paroxysmal atrial fibrillation) (HCC) - Continue home regimen - Continue home warfarin per pharmacy dosing  Coronary artery disease No chest pain reported at this time.  Troponin minimally elevated, but likely mild demand in the setting of BiPAP requirement.  - Continue home regimen  Sleep apnea - BiPAP at bedtime  Essential hypertension, benign - Continue home regimen  Advance Care Planning:   Code Status: Full Code patient states he would not want to be on long-term life support.  He understands that he has high risk of not being able to wean off of ventilator, and in that scenario, would want to be transition to comfort measures.  His wife at bedside understands this.  Consults: None  Family Communication: Patient's wife updated at bedside  Severity of Illness: The appropriate patient status for this patient is INPATIENT. Inpatient status is judged to be reasonable and necessary in order to provide the required intensity of service to ensure the patient's safety. The patient's presenting symptoms, physical exam findings, and  initial radiographic and laboratory data in the context of their chronic comorbidities is felt to place them at high risk for further clinical deterioration. Furthermore, it is not anticipated that the patient will be medically stable for discharge from the hospital within 2 midnights of admission.   * I certify that at the point of admission it is my clinical judgment that the patient will require inpatient hospital care spanning beyond 2 midnights from the point of admission due to high intensity of service, high risk for further deterioration and high frequency of surveillance required.*  Author: Verdene Lennert, MD 04/02/2023 4:00 PM  For on call review [email or web address].

## 2023-04-02 NOTE — Assessment & Plan Note (Signed)
-   Continue home regimen 

## 2023-04-02 NOTE — Assessment & Plan Note (Signed)
Several day history of increased shortness of breath, cough and sputum production consistent with a COPD exacerbation.  Chest x-ray appears clear on personal review.  - S/p Solu-Medrol 125 mg once - Start prednisone 60 mg daily tomorrow - Hold home Decadron - RVP and procalcitonin pending - DuoNebs every 6 hours - Pulmicort BID

## 2023-04-02 NOTE — Assessment & Plan Note (Signed)
-   BiPAP at bedtime 

## 2023-04-02 NOTE — ED Notes (Signed)
RT called to put pt on BiPAP per MD Huel Cote

## 2023-04-02 NOTE — Assessment & Plan Note (Signed)
Patient has a history of HFrEF with a EF as low as 30% with improvement to 35-40% on most recent echo.  No evidence of hypervolemia on examination at this time. -Continue Entresto, Toprol, Lasix

## 2023-04-02 NOTE — Assessment & Plan Note (Signed)
No chest pain reported at this time.  Troponin minimally elevated, but likely mild demand in the setting of BiPAP requirement.  - Continue home regimen

## 2023-04-02 NOTE — ED Triage Notes (Signed)
Pt to ED via [facility] with complaints of difficulty breathing x3 days. Pt has hx COPd and wears chronic 3L [place] at home. Pt reports productive cough x3 days. Pt endorses cp. EMS given 1 albuterol and 1 duoneb treatment. Pt has pacemaker and defibrillator in place.  EMS reports SPO2 80% on 3L on their arrival.

## 2023-04-02 NOTE — ED Provider Notes (Signed)
[facility] Provider Note    Event Date/Time   First MD Initiated Contact with Patient 04/02/23 1220     (approximate)   History   Respiratory Distress   HPI  Tyler [name] is a 67 y.o. male with an extensive history of pulmonary disease and cardiac disease including atrial fibrillation, Coumadin, self-reports previous DVT and PE, cardiac pacemaker, severe COPD  For about 3 to 4 days now is experiencing cough wheezing, has been using ipratropium, inhalers at home albuterol, despite this he reports increasing shortness of breath coughing and wheezing.  He has not had a fever.  The cough is productive at times.  There is no chest pain.  He has been compliant with his Coumadin treatments.  He also takes Bactrim 3 times weekly as prophylactic     Physical Exam   Triage Vital Signs: ED Triage Vitals [04/02/23 1219]  Encounter Vitals Group     BP 111/81     Systolic BP Percentile      Diastolic BP Percentile      Pulse Rate 76     Resp (!) 27     Temp 98.3 F (36.8 C)     Temp Source Oral     SpO2 99 %     Weight 162 lb (73.5 kg)     Height 6\' 1"  (1.854 m)     Head Circumference      Peak Flow      Pain Score 2     Pain Loc      Pain Education      Exclude from Growth Chart     Most recent vital signs: Vitals:   04/02/23 1219 04/02/23 1230  BP: 111/81 (!) 117/98  Pulse: 76 78  Resp: (!) 27 (!) 26  Temp: 98.3 F (36.8 C)   SpO2: 99% 99%     General: Awake, mild to moderately increased work of breathing with accessory muscle use.  He is fully awake and alert very pleasant conversant currently on nasal cannula CV:  Good peripheral perfusion.  Normal tone Resp:  Mild tachypnea accessory muscle use does not show evidence of severe increased work of breathing, currently on nasal cannula, diffuse expiratory wheezing throughout all fields. Abd:  No distention.  Soft nontender Other:  Previous lower extremity amputations   ED Results /  Procedures / Treatments   Labs (all labs ordered are listed, but only abnormal results are displayed) Labs Reviewed  BASIC METABOLIC PANEL - Abnormal; Notable for the following components:      Result Value   Sodium 130 (*)    Chloride 89 (*)    Glucose, Bld 113 (*)    Calcium 8.8 (*)    All other components within normal limits  CBC - Abnormal; Notable for the following components:   WBC 12.9 (*)    RBC 4.01 (*)    Hemoglobin 12.5 (*)    HCT 37.7 (*)    All other components within normal limits  PROTIME-INR - Abnormal; Notable for the following components:   Prothrombin Time 26.6 (*)    INR 2.4 (*)    All other components within normal limits  TROPONIN I (HIGH SENSITIVITY) - Abnormal; Notable for the following components:   Troponin I (High Sensitivity) 22 (*)    All other components within normal limits  CULTURE, BLOOD (ROUTINE X 2)  CULTURE, BLOOD (ROUTINE X 2)  RESP PANEL BY RT-PCR (FLU A&B, COVID) [id number]  BRAIN NATRIURETIC PEPTIDE  PROCALCITONIN  TROPONIN I (HIGH SENSITIVITY)   Labs notable for mild hyponatremia.  Leukocytosis is present.  INR 2.4  EKG  And interpreted by me at 1220 heart rate 75 QRS 200 QTc 460 with an associated right bundle branch block/probable underlying paced rhythm  No frank ischemia in the setting of ventricular pacing   RADIOLOGY  Chest x-ray result not yet interpreted by radiologist, final read pending  On my inspection grossly there is no acute infiltrate or pneumothorax   PROCEDURES:  Critical Care performed: Yes, see critical care procedure note(s)  CRITICAL CARE Performed by: Sharyn Creamer   Total critical care time: 35 minutes  Critical care time was exclusive of separately billable procedures and treating other patients.  Critical care was necessary to treat or prevent imminent or life-threatening deterioration.  Critical care was time spent personally by me on the following activities: development of treatment plan with  patient and/or surrogate as well as nursing, discussions with consultants, evaluation of patient's response to treatment, examination of patient, obtaining history from patient or surrogate, ordering and performing treatments and interventions, ordering and review of laboratory studies, ordering and review of radiographic studies, pulse oximetry and re-evaluation of patient's condition.  Procedures   MEDICATIONS ORDERED IN ED: Medications  azithromycin (ZITHROMAX) 500 mg in dextrose 5 % 250 mL IVPB (has no administration in time range)  morphine (PF) 2 MG/ML injection 2 mg (has no administration in time range)  ipratropium-albuterol (DUONEB) 0.5-2.5 (3) MG/3ML nebulizer solution 3 mL (3 mLs Nebulization Given 04/02/23 1303)  ipratropium-albuterol (DUONEB) 0.5-2.5 (3) MG/3ML nebulizer solution 3 mL (3 mLs Nebulization Given 04/02/23 1303)  methylPREDNISolone sodium succinate (SOLU-MEDROL) 125 mg/2 mL injection 125 mg (125 mg Intravenous Given 04/02/23 1303)  albuterol (PROVENTIL) (2.5 MG/3ML) 0.083% nebulizer solution 5 mg (5 mg Nebulization Given 04/02/23 1405)     IMPRESSION / MDM / ASSESSMENT AND PLAN / ED COURSE  I reviewed the triage vital signs and the nursing notes.                              Differential diagnosis includes, but is not limited to, COPD exacerbation, pneumonia, viral illness acute bronchitis, far less likely without associated chest pain ACS, doubt PE he has no pleuritic chest pain, he is actively anticoagulated with therapeutic INR.  He has obvious wheezing and symptoms that are suggestive of underlying COPD exacerbation.  Labs are reviewed.  Minimally elevated troponin suspect likely demand.  Consulted with and case discussed with her hospitalist Dr. Huel Cote who will admit for further care and treatment.  Patient's presentation is most consistent with acute presentation with potential threat to life or bodily function.   The patient is on the cardiac monitor to  evaluate for evidence of arrhythmia and/or significant heart rate changes.  Patient reassessed at approximately 1:45 PM.  Improving with less but still present wheezing.  Mild tachypnea accessory muscle use but does not appear to show fatigue or severe distress.  Currently does not appear to be in need of ventilator therapy/BiPAP and will monitor closely and give additional albuterol.  Understanding and agreeable with plan for admission  Procalcitonin and COVID testing ordered.  Pending at time of admission decision.  Discussed with the patient and notified hospitalist as he reports he uses Trelegy at night  Patient agreeable with plan for admission.  Feels slowly improving.  Will be monitored closely  After discussion with hospitalist will initiate azithromycin, wish  to discuss his prophylactic Bactrim and also his presentation.  Procalcitonin pending.  At this juncture no clear diagnosis of acute bacterial infection or sepsis is made, though further workup is necessitated and hospitalist will follow-up with her on test such as procalcitonin, final radiology read of cxr, etc.     FINAL CLINICAL IMPRESSION(S) / ED DIAGNOSES   Final diagnoses:  None     Rx / DC Orders   ED Discharge Orders     None        Note:  This document was prepared using Dragon voice recognition software and may include unintentional dictation errors.   Sharyn Creamer, MD 04/02/23 1429

## 2023-04-02 NOTE — Assessment & Plan Note (Signed)
Pharmacy to dose Coumadin, Toprol for rate control.

## 2023-04-03 DIAGNOSIS — G4733 Obstructive sleep apnea (adult) (pediatric): Secondary | ICD-10-CM

## 2023-04-03 DIAGNOSIS — J9621 Acute and chronic respiratory failure with hypoxia: Secondary | ICD-10-CM | POA: Diagnosis not present

## 2023-04-03 DIAGNOSIS — J441 Chronic obstructive pulmonary disease with (acute) exacerbation: Secondary | ICD-10-CM | POA: Diagnosis not present

## 2023-04-03 DIAGNOSIS — N400 Enlarged prostate without lower urinary tract symptoms: Secondary | ICD-10-CM

## 2023-04-03 DIAGNOSIS — I5022 Chronic systolic (congestive) heart failure: Secondary | ICD-10-CM

## 2023-04-03 DIAGNOSIS — U071 COVID-19: Secondary | ICD-10-CM

## 2023-04-03 DIAGNOSIS — I480 Paroxysmal atrial fibrillation: Secondary | ICD-10-CM

## 2023-04-03 DIAGNOSIS — I2510 Atherosclerotic heart disease of native coronary artery without angina pectoris: Secondary | ICD-10-CM

## 2023-04-03 DIAGNOSIS — I10 Essential (primary) hypertension: Secondary | ICD-10-CM

## 2023-04-03 LAB — BASIC METABOLIC PANEL
Anion gap: 10 (ref 5–15)
BUN: 10 mg/dL (ref 8–23)
CO2: 28 mmol/L (ref 22–32)
Calcium: 8.7 mg/dL — ABNORMAL LOW (ref 8.9–10.3)
Chloride: 97 mmol/L — ABNORMAL LOW (ref 98–111)
Creatinine, Ser: 0.61 mg/dL (ref 0.61–1.24)
GFR, Estimated: >60 mL/min (ref >60)
Glucose, Bld: 89 mg/dL (ref 70–99)
Potassium: 3.9 mmol/L (ref 3.5–5.1)
Sodium: 135 mmol/L (ref 135–145)

## 2023-04-03 LAB — CBC WITH DIFFERENTIAL/PLATELET
Abs Immature Granulocytes: 0.05 10*3/uL (ref 0.00–0.07)
Basophils Absolute: 0 10*3/uL (ref 0.0–0.1)
Basophils Relative: 0 %
Eosinophils Absolute: 0 10*3/uL (ref 0.0–0.5)
Eosinophils Relative: 0 %
HCT: 32.7 % — ABNORMAL LOW (ref 39.0–52.0)
Hemoglobin: 10.8 g/dL — ABNORMAL LOW (ref 13.0–17.0)
Immature Granulocytes: 1 %
Lymphocytes Relative: 6 %
Lymphs Abs: 0.4 10*3/uL — ABNORMAL LOW (ref 0.7–4.0)
MCH: 31.5 pg (ref 26.0–34.0)
MCHC: 33 g/dL (ref 30.0–36.0)
MCV: 95.3 fL (ref 80.0–100.0)
Monocytes Absolute: 0.8 10*3/uL (ref 0.1–1.0)
Monocytes Relative: 10 %
Neutro Abs: 6.4 10*3/uL (ref 1.7–7.7)
Neutrophils Relative %: 83 %
Platelets: 213 10*3/uL (ref 150–400)
RBC: 3.43 MIL/uL — ABNORMAL LOW (ref 4.22–5.81)
RDW: 13.6 % (ref 11.5–15.5)
WBC: 7.7 10*3/uL (ref 4.0–10.5)
nRBC: 0.4 % — ABNORMAL HIGH (ref 0.0–0.2)

## 2023-04-03 LAB — PROTIME-INR
INR: 2.6 — ABNORMAL HIGH (ref 0.8–1.2)
Prothrombin Time: 28.4 s — ABNORMAL HIGH (ref 11.4–15.2)

## 2023-04-03 LAB — HIV ANTIBODY (ROUTINE TESTING W REFLEX): HIV Screen 4th Generation wRfx: NONREACTIVE

## 2023-04-03 MED ORDER — METHYLPREDNISOLONE SODIUM SUCC 40 MG IJ SOLR
40.0000 mg | Freq: Every day | INTRAMUSCULAR | Status: DC
  Administered 2023-04-03 – 2023-04-04 (×2): 40 mg via INTRAVENOUS
  Filled 2023-04-03 (×2): qty 1

## 2023-04-03 MED ORDER — BUDESONIDE 0.5 MG/2ML IN SUSP
0.5000 mg | Freq: Two times a day (BID) | RESPIRATORY_TRACT | Status: DC
  Administered 2023-04-03 – 2023-04-05 (×4): 0.5 mg via RESPIRATORY_TRACT
  Filled 2023-04-03 (×4): qty 2

## 2023-04-03 MED ORDER — WARFARIN SODIUM 4 MG PO TABS
4.0000 mg | ORAL_TABLET | Freq: Once | ORAL | Status: AC
  Administered 2023-04-03: 4 mg via ORAL
  Filled 2023-04-03 (×2): qty 1

## 2023-04-03 MED ORDER — INFLUENZA VAC A&B SURF ANT ADJ 0.5 ML IM SUSY
0.5000 mL | PREFILLED_SYRINGE | INTRAMUSCULAR | Status: AC
  Administered 2023-04-04: 0.5 mL via INTRAMUSCULAR
  Filled 2023-04-03: qty 0.5

## 2023-04-03 NOTE — Assessment & Plan Note (Signed)
On Flomax 

## 2023-04-03 NOTE — Consult Note (Signed)
Pulmonary Medicine          Date: 04/03/2023,   MRN# [id number] Tyler [name] 03/16/1956     AdmissionWeight: 73.5 kg                 CurrentWeight: 73.5 kg      CHIEF COMPLAINT:   Sob and covid positive   HISTORY OF PRESENT ILLNESS   This is a 67 yr old male with multiple chronic problems (listed below), who came in with increase dyspnea and Coughing up green sputum. He was noted to be covid positive. On arrival he needed to be placed on bipap. He has a trelegy system at home he wears at night. Chest xray did not show any infiltrates. On two and a half liters of oxygen. Since being here he is feeling better. But still quite sob with activity. No chest pain, ectopy, syncope, rashes, stiff neck or cns sxs.   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   Atrial fibrillation (HCC)    Cervical spinal stenosis    with neuropathy   CHF (congestive heart failure) (HCC)    Constipation    COPD (chronic obstructive pulmonary disease) (HCC)    COPD with acute exacerbation (HCC) 10/13/2016   Coronary artery disease    Depression    Dyspnea    Dysrhythmia    atrial fibrillation   Emphysema of lung (HCC)    Factor 5 Leiden mutation, heterozygous (HCC)    on coumadin   Factor V Leiden mutation (HCC)    GERD (gastroesophageal reflux disease)    Hypertension    Lung nodule seen on imaging study    being followed by dr. Belia Heman. just watching it for last few years, without change   Mitral valve insufficiency    Moderate tricuspid insufficiency    Myocardial infarction [facility]) 2004   stent placed, pacemaker implanted 2005   Osteomyelitis (HCC) 10/2019   left foot   Oxygen dependent    requires 2L nasal prong oxygen 24 hours a day    Peripheral vascular disease (HCC)    Presence of permanent cardiac pacemaker [phone]   PVD (peripheral vascular disease) (HCC)    Sleep apnea    waiting to have sleep study. Used bipap while hospitalized and said it was great for him.     SURGICAL HISTORY   Past Surgical History:  Procedure Laterality Date   ABOVE KNEE LEG AMPUTATION Right    after below the knee amputation    AMPUTATION Left 11/22/2019   Procedure: AMPUTATION BELOW KNEE;  Surgeon: Annice Needy, MD;  Location: [facility] ORS;  Service: Vascular;  Laterality: Left;   AMPUTATION TOE Left 08/04/2019   Procedure: AMPUTATION TOE MPJ LEFT;  Surgeon: Gwyneth Revels, DPM;  Location: [facility];  Service: Podiatry;  Laterality: Left;   AMPUTATION TOE Left 10/06/2019   Procedure: AMPUTATION TOE MPJ T1,T2 LEFT;  Surgeon: Gwyneth Revels, DPM;  Location: [facility];  Service: Podiatry;  Laterality: Left;   APPLICATION OF WOUND VAC Left 01/12/2018   Procedure: APPLICATION OF WOUND VAC;  Surgeon: Annice Needy, MD;  Location: [facility];  Service: Vascular;  Laterality: Left;   BELOW KNEE LEG AMPUTATION Right    CATARACT EXTRACTION, BILATERAL  Bilateral    COLONOSCOPY N/A 02/19/2020   Procedure: COLONOSCOPY;  Surgeon: Regis Bill, MD;  Location: [facility]COPY;  Service: Endoscopy;  Laterality: N/A;   COLONOSCOPY WITH PROPOFOL N/A 08/27/2020   Procedure: COLONOSCOPY WITH PROPOFOL;  Surgeon: Regis Bill, MD;  Location: [facility] ENDOSCOPY;  Service: Endoscopy;  Laterality: N/A;  REQ MID AM   CORONARY ANGIOPLASTY  2005   stent x 1 placed   ENDARTERECTOMY FEMORAL Left 08/11/2017   Procedure: ENDARTERECTOMY FEMORAL;  Surgeon: Annice Needy, MD;  Location: [facility];  Service: Vascular;  Laterality: Left;   ESOPHAGOGASTRODUODENOSCOPY N/A 02/19/2020   Procedure: ESOPHAGOGASTRODUODENOSCOPY (EGD);  Surgeon: Regis Bill, MD;  Location: [facility] ENDOSCOPY;  Service: Endoscopy;  Laterality: N/A;   HEMATOMA EVACUATION Left 01/12/2018   Procedure:  EVACUATION HEMATOMA ( DRAINING OF SEROMA);  Surgeon: Annice Needy, MD;  Location: [facility];  Service: Vascular;  Laterality: Left;   IMPLANTABLE CARDIOVERTER DEFIBRILLATOR (ICD) GENERATOR CHANGE Left 02/10/2017   Procedure: ICD GENERATOR CHANGE;  Surgeon: Marcina Millard, MD;  Location: [facility];  Service: Cardiovascular;  Laterality: Left;   INSERT / REPLACE / REMOVE PACEMAKER  [id number], 2018   LOWER EXTREMITY ANGIOGRAPHY Left 06/07/2017   Procedure: LOWER EXTREMITY ANGIOGRAPHY;  Surgeon: Annice Needy, MD;  Location: [facility] INVASIVE CV [facility];  Service: Cardiovascular;  Laterality: Left;   LOWER EXTREMITY ANGIOGRAPHY Left 10/05/2019   Procedure: LOWER EXTREMITY ANGIOGRAPHY;  Surgeon: Annice Needy, MD;  Location: [facility];  Service: Cardiovascular;  Laterality: Left;   LOWER EXTREMITY INTERVENTION  06/07/2017   Procedure: LOWER EXTREMITY INTERVENTION;  Surgeon: Annice Needy, MD;  Location: [facility];  Service: Cardiovascular;;   PERIPHERAL VASCULAR CATHETERIZATION Left 12/09/2015   Procedure: Lower Extremity Angiography;  Surgeon: Annice Needy, MD;  Location: [facility];  Service: Cardiovascular;  Laterality: Left;   PERIPHERAL VASCULAR CATHETERIZATION  12/09/2015   Procedure: Lower Extremity Intervention;  Surgeon: Annice Needy, MD;  Location: [facility];  Service: Cardiovascular;;   TONSILLECTOMY     TRANSMETATARSAL AMPUTATION Left 10/20/2019   Procedure: TRANSMETATARSAL AMPUTATION;  Surgeon: Linus Galas, DPM;  Location: [facility];  Service: Podiatry;  Laterality: Left;     FAMILY HISTORY   Family History  Problem Relation Age of Onset   Cancer Mother    Cancer Father    Heart disease Father      SOCIAL HISTORY   Social History   Tobacco Use   Smoking status: Former    Current packs/day: 0.00    Average packs/day: 1 pack/day for 42.0 years (42.0 ttl pk-yrs)    Types: Cigarettes    Start date: 09/24/1971    Quit date: 09/23/2013    Years since  quitting: 9.5   Smokeless tobacco: Never  Vaping Use   Vaping status: Never Used  Substance Use Topics   Alcohol use: No    Comment: quit 6 years ago   Drug use: No     MEDICATIONS    Home Medication:    Current Medication:  Current Facility-Administered Medications:    ALPRAZolam (XANAX) tablet 1 mg, 1 mg, Oral, BID PRN, Verdene Lennert, MD, 1 mg at 04/03/23 1012   aspirin EC tablet 81 mg, 81 mg, Oral, QPC supper, Verdene Lennert, MD, 81 mg at 04/03/23 1820   budesonide (PULMICORT) nebulizer solution 0.5 mg, 0.5 mg, Nebulization, Q12H, Wieting, Richard, MD   citalopram (CELEXA) tablet 20 mg, 20 mg, Oral, Daily, Verdene Lennert, MD, 20  mg at 04/03/23 1013   docusate sodium (COLACE) capsule 100 mg, 100 mg, Oral, Daily PRN, Verdene Lennert, MD   furosemide (LASIX) tablet 20 mg, 20 mg, Oral, Daily, Verdene Lennert, MD, 20 mg at 04/03/23 1014   gabapentin (NEURONTIN) capsule 300 mg, 300 mg, Oral, TID, Verdene Lennert, MD, 300 mg at 04/03/23 1638   [START ON 04/04/2023] influenza vaccine adjuvanted (FLUAD) injection 0.5 mL, 0.5 mL, Intramuscular, Tomorrow-1000, Wieting, Richard, MD   ipratropium-albuterol (DUONEB) 0.5-2.5 (3) MG/3ML nebulizer solution 3 mL, 3 mL, Nebulization, Q6H, Verdene Lennert, MD, 3 mL at 04/03/23 1403   methylPREDNISolone sodium succinate (SOLU-MEDROL) 40 mg/mL injection 40 mg, 40 mg, Intravenous, Daily, Renae Gloss, Richard, MD, 40 mg at 04/03/23 1014   metoprolol succinate (TOPROL-XL) 24 hr tablet 25 mg, 25 mg, Oral, Daily, Verdene Lennert, MD, 25 mg at 04/03/23 1013   oxyCODONE-acetaminophen (PERCOCET) 7.5-325 MG per tablet 1-2 tablet, 1-2 tablet, Oral, Q6H PRN, Verdene Lennert, MD, 1 tablet at 04/03/23 1638   pantoprazole (PROTONIX) EC tablet 40 mg, 40 mg, Oral, QAC breakfast, Verdene Lennert, MD, 40 mg at 04/03/23 0742   pravastatin (PRAVACHOL) tablet 20 mg, 20 mg, Oral, q1800, Verdene Lennert, MD, 20 mg at 04/03/23 1820   sacubitril-valsartan (ENTRESTO) 24-26 mg  per tablet, 1 tablet, Oral, BID, Verdene Lennert, MD, 1 tablet at 04/03/23 1248   sodium chloride flush (NS) 0.9 % injection 3 mL, 3 mL, Intravenous, Q12H, Verdene Lennert, MD, 3 mL at 04/02/23 2227   tamsulosin (FLOMAX) capsule 0.4 mg, 0.4 mg, Oral, QPC breakfast, Verdene Lennert, MD, 0.4 mg at 04/03/23 1013   Warfarin - Pharmacist Dosing Inpatient, , Does not apply, q1600, Orson Aloe, RPH    ALLERGIES   Apixaban and Rivaroxaban     REVIEW OF SYSTEMS    Review of Systems:  Gen:  Denies  fever, sweats, chills weigh loss  HEENT: Denies blurred vision, double vision, ear pain, eye pain, hearing loss, nose bleeds, sore throat Cardiac:  No dizziness, chest pain or heaviness, chest tightness,edema Resp:   + cough +  sputum porduction, + shortness of breath, + wheezing,   - hemoptysis,  Gi: Denies swallowing difficulty, stomach pain, nausea or vomiting, diarrhea, constipation, bowel incontinence Gu:  Denies bladder incontinence, burning urine Ext:   Denies Joint pain, stiffness or swelling Skin: Denies  skin rash, easy bruising or bleeding or hives Endoc:  Denies polyuria, polydipsia , polyphagia or weight change Psych:   Denies depression, insomnia or hallucinations   Other:  All other systems negative   VS: BP (!) 121/49   Pulse 76   Temp 97.8 F (36.6 C) (Oral)   Resp (!) 22   Ht 6\' 1"  (1.854 m)   Wt 73.5 kg   SpO2 97%   BMI 21.37 kg/m      PHYSICAL EXAM    GENERAL:no fevers, chills, no weakness bipap off, speaking in full sentences HEAD: Normocephalic, atraumatic.  EYES: Pupils equal, round, reactive to light. Extraocular muscles intact. No scleral icterus.  MOUTH: Moist mucosal membrane. Dentition intact. No abscess noted.  EAR, NOSE, THROAT: Clear without exudates. No external lesions.  NECK: Supple. No thyromegaly. No nodules. No JVD.  PULMONARY: + dostant, wheezes decrease air flow CARDIOVASCULAR: S1 and S2. Regular rate and rhythm. No murmurs, rubs,  or gallops. No edema. Pedal pulses 2+ bilaterally.  GASTROINTESTINAL: Soft, nontender, nondistended. No masses. Positive bowel sounds. No hepatosplenomegaly.  MUSCULOSKELETAL: bilateral lower extremity amputations NEUROLOGIC: Cranial nerves II through XII are intact. No gross  focal neurological deficits. Sensation intact. Reflexes intact.  SKIN: No ulceration, lesions, rashes, or cyanosis. Skin warm and dry. Turgor intact.  PSYCHIATRIC: Mood, affect within normal limits. The patient is awake, alert and oriented x 3. Insight, judgment intact.       IMAGING    DG Chest Port 1 View  Result Date: 04/02/2023 CLINICAL DATA:  Shortness of breath. EXAM: PORTABLE CHEST 1 VIEW COMPARISON:  March 10, 2022. FINDINGS: Stable cardiomediastinal silhouette. Left-sided defibrillator is unchanged in position. Emphysematous disease is noted in the upper lobes. No acute pulmonary disease. Bony thorax is unremarkable. IMPRESSION: No active disease. Emphysema (ICD10-J43.9). Electronically Signed   By: Lupita Raider M.D.   On: 04/02/2023 15:15      ASSESSMENT/PLAN   This is a pleasant gentleman, presents with dyspnea, covid positive, wheezing, and still relatively tight. C/w copd exacerbation. He is off bipap, on low fio2, slowly improving on present regimen and supportive care. Does not appear to be in failure. 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   Covid positive, recurrent -supportive care   Copd exacerbation -duo neb/ pulmicort -solumedrol 40 mg iv bid -bipap at night and prn -revisit in am  Sleep apnea -bip[ap at night on present settings  Hx of chf, not in faliure at present. Afib ( rate controlled) -will continue with out patient cardiology recs ( entresto, toprol, asa,warfarin)      Thank you for allowing me to participate in the care of this patient.   Patient/Family are satisfied with care plan and all questions have been answered.   This document was prepared using Dragon voice recognition software and may include unintentional dictation errors.     Ned Clines, M.D.  Division of Pulmonary & Critical Care Medicine  [facility]

## 2023-04-03 NOTE — Progress Notes (Signed)
Occupational Therapy Evaluation Patient Details Name: Tyler [name] MRN: [id number] DOB: 05-17-1956 Today's Date: 04/03/2023   History of Present Illness Pt is a 67 y.o. male admitted with hypoxic respiratory failure, found positive for COVID-19, acute COPD exacerbation, and chronic systolic CHF. PMH significant for COPD on 3L chronically, CHF, bronchiectasis, atrial fibrillation, OSA on BiPAP, HTN, R AKA, L BKA   Clinical Impression   OT/PT co-eval completed for pt/therapist safety and to maximize functional outcomes. Prior to hospital admission, pt required assist for bathing due to SOB, iuses a manual wc or transport chair for mobility, and can transfer mod independently with L prosthesis donned. Pt lives in a mobile home with spouse. Pt currently requires frequent rest breaks due to dyspnea on exertion, bed mobility CGA, anticipate ADLs primarily limited by decreased activity tolerance  at min-mod assist level.  Pt would benefit from skilled OT services to address noted impairments and functional limitations (see below for any additional details) in order to maximize safety and independence while minimizing falls risk and caregiver burden. OT will follow to maximize functional gains while hospitalized.       If plan is discharge home, recommend the following: A little help with bathing/dressing/bathroom;A little help with walking and/or transfers;Help with stairs or ramp for entrance;Assistance with cooking/housework    Functional Status Assessment  Patient has had a recent decline in their functional status and demonstrates the ability to make significant improvements in function in a reasonable and predictable amount of time.  Equipment Recommendations  None recommended by OT       Precautions / Restrictions Precautions Precautions: Fall Precaution Comments: Covid+ Required Braces or Orthoses:  (L prosthesis, does not use prosthesis R LE)      Mobility Bed Mobility Overal bed  mobility: Needs Assistance Bed Mobility: Supine to Sit     Supine to sit: Contact guard, HOB elevated     General bed mobility comments: cues for breaks due to SOB    Transfers                   General transfer comment: NT due to SOB      Balance Overall balance assessment: Needs assistance Sitting-balance support: Feet unsupported, No upper extremity supported Sitting balance-Leahy Scale: Fair     Standing balance support:  (NT)                               ADL either performed or assessed with clinical judgement   ADL Overall ADL's : Needs assistance/impaired     Grooming: Set up;Sitting                   Toilet Transfer: Minimal assistance;Stand-pivot;Squat-pivot;BSC/3in1 Toilet Transfer Details (indicate cue type and reason): clincal judgement         Functional mobility during ADLs: Minimal assistance General ADL Comments: Assessment limited by dyspnea noted with slight exertion. Pt extremely SOB with activity, even SOB talking to OT/PT.      Pertinent Vitals/Pain Pain Assessment Pain Assessment: No/denies pain     Extremity/Trunk Assessment Upper Extremity Assessment Upper Extremity Assessment: Overall WFL for tasks assessed   Lower Extremity Assessment Lower Extremity Assessment: Defer to PT evaluation       Communication Communication Communication: No apparent difficulties   Cognition Arousal: Alert Behavior During Therapy: WFL for tasks assessed/performed Overall Cognitive Status: Within Functional Limits for tasks assessed  General Comments  BP 109/91, RR 25-35, SpO2% 87-99 on 2.5L, small spots of blood from IV on bed (pt reports bleeding when IV placed)            Home Living Family/patient expects to be discharged to:: Private residence Living Arrangements: Spouse/significant other Available Help at Discharge: Family Type of Home: Other(Comment)  (mobile home) Home Access: Ramped entrance     Home Layout: One level     Bathroom Shower/Tub: Sponge bathes at baseline     Bathroom Accessibility: No (has to transfer to transport chair as manual wc does not fit in bathroom)   Home Equipment: BSC/3in1;Transport chair;Hospital bed;Wheelchair - manual          Prior Functioning/Environment Prior Level of Function : Needs assist             Mobility Comments: in wc for about five years, has only LLE prosthesis for transfers, does not use prosthesis for RLE. pt states he can perform transfers mod independent wc level ADLs Comments: spouse assists with bathing, pt says he can typically dress himself        OT Problem List: Decreased strength;Decreased activity tolerance;Impaired balance (sitting and/or standing);Cardiopulmonary status limiting activity      OT Treatment/Interventions: Self-care/ADL training;Energy conservation;DME and/or AE instruction;Therapeutic activities;Patient/family education;Balance training    OT Goals(Current goals can be found in the care plan section) Acute Rehab OT Goals OT Goal Formulation: With patient Time For Goal Achievement: 04/17/23 Potential to Achieve Goals: Good  OT Frequency: Min 1X/week    Co-evaluation PT/OT/SLP Co-Evaluation/Treatment: Yes Reason for Co-Treatment: For patient/therapist safety;To address functional/ADL transfers   OT goals addressed during session: ADL's and self-care;Strengthening/ROM      AM-PAC OT "6 Clicks" Daily Activity     Outcome Measure Help from another person eating meals?: None Help from another person taking care of personal grooming?: A Little Help from another person toileting, which includes using toliet, bedpan, or urinal?: A Lot Help from another person bathing (including washing, rinsing, drying)?: A Lot Help from another person to put on and taking off regular upper body clothing?: A Little Help from another person to put on and taking  off regular lower body clothing?: A Lot 6 Click Score: 16   End of Session Equipment Utilized During Treatment: Oxygen (2.5L via [place]) Nurse Communication: Mobility status  Activity Tolerance: Other (comment) (limited by SOB and resp status) Patient left: in bed;with call bell/phone within reach  OT Visit Diagnosis: Other abnormalities of gait and mobility (R26.89);Unsteadiness on feet (R26.81)                Time: 4259-5638 OT Time Calculation (min): 21 min Charges:  OT General Charges $OT Visit: 1 Visit OT Evaluation $OT Eval Low Complexity: 1 Low  Ezequias Lard L. Yaron Grasse, OTR/L  04/03/23, 4:25 PM

## 2023-04-03 NOTE — Assessment & Plan Note (Signed)
Patient admitted with respiratory distress and placed on BiPAP.  Tried to take off BiPAP a few times today but patient requested to go back on.  Currently on 30% FiO2.  Continue to monitor closely.  Patient wears the BiPAP at night at home.  Chronically on 3 L.

## 2023-04-03 NOTE — Assessment & Plan Note (Addendum)
Supportive care with steroids.  Can consider Actemra if worsens.

## 2023-04-03 NOTE — Hospital Course (Addendum)
67 year old man with past medical history of chronic respiratory failure on 3 L oxygen, COPD, bronchiectasis, atrial fibrillation on Coumadin, sleep apnea on BiPAP at night, heart failure with reduced ejection fraction, AICD, CAD, hypertension, factor V Leiden mutation, hyperlipidemia, bilateral lower extremity amputations.  He presents to the hospital with respiratory distress coughing up green sputum he was found to be COVID-positive.  11/9.  Patient was tried off the BiPAP a couple times and asked to go back on the BiPAP.   11/10.  Patient received baricitinib and increased dose of Solu-Medrol to 80 mg daily. 11/12.  Patient with more trouble breathing today than yesterday.  Blood pressure better today can give usual Toprol and Lasix dose.  Restart Entresto this evening. 11/13.  Off of BiPAP during daytime.  Still have a large amount of mucus production. Has been seen by pulmonology, condition has been improving, but he has end-stage COPD, long-term prognosis is very poor.  He wished to stay over the weekend to think about what he wanted do.  Eventually, he accepted hospice.  He will be discharged home with hospice referral.

## 2023-04-03 NOTE — Progress Notes (Signed)
Progress Note   Patient: Tyler [name] ION:[id number] DOB: 07-18-1955 DOA: 04/02/2023     1 DOS: the patient was seen and examined on 04/03/2023   Brief hospital course: 67 year old man with past medical history of chronic respiratory failure on 3 L oxygen, COPD, bronchiectasis, atrial fibrillation on Coumadin, sleep apnea on BiPAP at night, heart failure with reduced ejection fraction, AICD, CAD, hypertension, factor V Leiden mutation, hyperlipidemia, bilateral lower extremity amputations.  He presents to the hospital with respiratory distress coughing up green sputum he was found to be COVID-positive.  11/9.  Patient was tried off the BiPAP a couple times and asked to go back on the BiPAP.  Patient still very tight.  Coughing and wheezing.  Assessment and Plan: * Acute on chronic hypoxic respiratory failure [facility]) Patient admitted with respiratory distress and placed on BiPAP.  Tried to take off BiPAP a few times today but patient requested to go back on.  Currently on 30% FiO2.  Continue to monitor closely.  Patient wears the BiPAP at night at home.  Chronically on 3 L.  COVID-19 virus infection Supportive care with steroids.  Can consider Actemra if worsens.  COPD with acute exacerbation (HCC) Continue DuoNeb and budesonide nebulizers.  Changed to Solu-Medrol 40 mg daily.  Chronic systolic CHF (congestive heart failure), NYHA class 3 (HCC) Patient has a history of HFrEF with a EF as low as 30% with improvement to 35-40% on most recent echo.  No evidence of hypervolemia on examination at this time. -Continue Entresto, Toprol, Lasix  AF (paroxysmal atrial fibrillation) (HCC) Pharmacy to dose Coumadin, Toprol for rate control.  Coronary artery disease Continue Toprol and aspirin.  Sleep apnea BiPAP at bedtime  Essential hypertension, benign Continue Toprol Lasix and Entresto.  BPH (benign prostatic hyperplasia) On Flomax        Subjective: Patient admitted with  respiratory distress.  Still complaining of shortness of breath cough and wanted to go back on the BiPAP a few times today.  Physical Exam: Vitals:   04/03/23 1000 04/03/23 1030 04/03/23 1200 04/03/23 1251  BP: 129/69 (!) 118/96 (!) 109/91   Pulse: 77 73 75   Resp: 18 (!) 29 (!) 21   Temp:    97.6 F (36.4 C)  TempSrc:    Oral  SpO2: 100% 99% 98%   Weight:      Height:       Physical Exam HENT:     Head: Normocephalic.     Mouth/Throat:     Pharynx: No oropharyngeal exudate.  Eyes:     General: Lids are normal.     Conjunctiva/sclera: Conjunctivae normal.  Cardiovascular:     Rate and Rhythm: Normal rate and regular rhythm.     Heart sounds: Normal heart sounds, S1 normal and S2 normal.  Pulmonary:     Effort: Accessory muscle usage present.     Breath sounds: Examination of the right-middle field reveals decreased breath sounds and wheezing. Examination of the left-middle field reveals decreased breath sounds and wheezing. Examination of the right-lower field reveals decreased breath sounds and wheezing. Examination of the left-lower field reveals decreased breath sounds and wheezing. Decreased breath sounds and wheezing present. No rhonchi or rales.  Abdominal:     Palpations: Abdomen is soft.     Tenderness: There is no abdominal tenderness.  Musculoskeletal:     Right Lower Extremity: Right leg is amputated above knee.     Left Lower Extremity: Left leg is amputated below knee.  Skin:    General: Skin is warm.     Comments: Bruising upper extremities  Neurological:     Mental Status: He is alert and oriented to person, place, and time.     Data Reviewed: INR 2.6, creatinine 0.61, sodium 135, white blood cell count 7.7, hemoglobin 10.8, platelet count 213, procalcitonin negative  Family Communication: Updated patient's wife on phone  Disposition: Status is: Inpatient Remains inpatient appropriate because: Patient having hard time coming off the BiPAP during the day  today.  Continue Solu-Medrol and IV  Planned Discharge Destination: Home    Time spent: 28 minutes  Author: Alford Highland, MD 04/03/2023 1:56 PM  For on call review [email or web address].

## 2023-04-03 NOTE — ED Notes (Signed)
Pt has been tolerating being off of bipap well for the past hour. States if he needs it back on, he will inform this RN

## 2023-04-03 NOTE — Evaluation (Signed)
Physical Therapy Evaluation Patient Details Name: Tyler [name] MRN: [id number] DOB: 06/19/1955 Today's Date: 04/03/2023  History of Present Illness  Pt is a 67 y.o. male presenting to hospital 04/02/23 with c/o difficulty breathing x3 days.  Pt admitted with acute hypoxic respiratory failure, COPD with acute exacerbation. (+) COVID.  PMH includes chronic hypoxic respiratory failure on 3 L supplemental oxygen, COPD, bronchiectasis, a-fib, OSA on BiPAP, HFrEF, CAD, PAD, htn, factor V leiden mutation, HLD, AICD, anxiety, R AKA, L BKA.  Clinical Impression  Prior to recent medical concerns, pt was modified independent with w/c level transfers (using L LE prosthesis and manual w/c or transport chair); lives with his wife in 1 level home with ramp to enter.  No c/o pain during session.  Currently pt is SBA with lateral scooting R/L (x2 scoots each; sitting on edge of ED stretcher bed).  Limited activity d/t pt becoming quickly SOB with minimal activity; increased work of breathing noted (pt requiring extended seated rest break to improve breathing status).  Pt would currently benefit from skilled PT to address noted impairments and functional limitations (see below for any additional details).    If plan is discharge home, recommend the following: A little help with walking and/or transfers;A little help with bathing/dressing/bathroom;Assistance with cooking/housework;Assist for transportation;Help with stairs or ramp for entrance   Can travel by private vehicle        Equipment Recommendations Other (comment) (pt has needed DME at home already)  Recommendations for Other Services       Functional Status Assessment Patient has had a recent decline in their functional status and demonstrates the ability to make significant improvements in function in a reasonable and predictable amount of time.     Precautions / Restrictions Precautions Precautions: Fall Restrictions Other Position/Activity  Restrictions: L BKA prosthesis; R AKA      Mobility  Bed Mobility Overal bed mobility: Needs Assistance Bed Mobility: Supine to Sit     Supine to sit: Contact guard, HOB elevated     General bed mobility comments: pacing d/t SOB with minimal activity    Transfers Overall transfer level: Needs assistance Equipment used: None Transfers: Bed to chair/wheelchair/BSC            Lateral/Scoot Transfers: Supervision General transfer comment: lateral scoot to R/L x2 trials each (sitting on edge of stretcher bed); limited d/t SOB/increased work of breathing    Ambulation/Gait               General Gait Details: Pt non-ambulatory  Stairs            Wheelchair Mobility     Tilt Bed    Modified Rankin (Stroke Patients Only)       Balance Overall balance assessment: Needs assistance Sitting-balance support: Feet unsupported, No upper extremity supported Sitting balance-Leahy Scale: Good Sitting balance - Comments: steady reaching within BOS                                     Pertinent Vitals/Pain Pain Assessment Pain Assessment: No/denies pain    Home Living Family/patient expects to be discharged to:: Private residence Living Arrangements: Spouse/significant other Available Help at Discharge: Family Type of Home: Other(Comment) (Double wide mobile home) Home Access: Ramped entrance       Home Layout: One level Home Equipment: BSC/3in1;Transport chair;Hospital bed;Wheelchair - manual      Prior Function Prior Level of  Function : Needs assist             Mobility Comments: Pt reports being in w/c for about 5 years (too dangerous to attempt walking); uses L LE prosthesis for transfers (does not use R LE prosthesis); can perform transfers modified independent w/c level; 2.5 L chronic home O2 use ADLs Comments: Spouse assists with bathing; pt says he can typically dress himself     Extremity/Trunk Assessment   Upper  Extremity Assessment Upper Extremity Assessment: Overall WFL for tasks assessed    Lower Extremity Assessment Lower Extremity Assessment: RLE deficits/detail;LLE deficits/detail RLE Deficits / Details: R AKA (at least 3/5 AROM hip flexion) LLE Deficits / Details: L BKA (at least 3/5 AROM hip flexion and knee extension)    Cervical / Trunk Assessment Cervical / Trunk Assessment: Normal  Communication   Communication Communication: No apparent difficulties Cueing Techniques: Verbal cues;Visual cues  Cognition Arousal: Alert Behavior During Therapy: WFL for tasks assessed/performed Overall Cognitive Status: Within Functional Limits for tasks assessed                                          General Comments General comments (skin integrity, edema, etc.): BP 109/91; RR 25-35; SpO2 sats 87-99% on 2.5 L O2 via nasal cannula during session (96% or greater at rest).  Nursing cleared pt for participation in physical therapy.  Pt agreeable to PT/OT session.    Exercises     Assessment/Plan    PT Assessment Patient needs continued PT services  PT Problem List Decreased strength;Decreased activity tolerance;Decreased balance;Decreased mobility;Cardiopulmonary status limiting activity       PT Treatment Interventions DME instruction;Functional mobility training;Therapeutic activities;Therapeutic exercise;Balance training;Patient/family education    PT Goals (Current goals can be found in the Care Plan section)  Acute Rehab PT Goals Patient Stated Goal: to improve breathing and strength PT Goal Formulation: With patient Time For Goal Achievement: 04/17/23 Potential to Achieve Goals: Good    Frequency Min 1X/week     Co-evaluation PT/OT/SLP Co-Evaluation/Treatment: Yes Reason for Co-Treatment: For patient/therapist safety;To address functional/ADL transfers PT goals addressed during session: Mobility/safety with mobility OT goals addressed during session: ADL's  and self-care;Strengthening/ROM       AM-PAC PT "6 Clicks" Mobility  Outcome Measure Help needed turning from your back to your side while in a flat bed without using bedrails?: None Help needed moving from lying on your back to sitting on the side of a flat bed without using bedrails?: A Little Help needed moving to and from a bed to a chair (including a wheelchair)?: A Little Help needed standing up from a chair using your arms (e.g., wheelchair or bedside chair)?: A Little Help needed to walk in hospital room?: Total Help needed climbing 3-5 steps with a railing? : Total 6 Click Score: 15    End of Session Equipment Utilized During Treatment: Oxygen (2.5 L via nasal cannula) Activity Tolerance: Other (comment) (limited d/t SOB with minimal activity) Patient left: in bed;with call bell/phone within reach Nurse Communication: Mobility status;Precautions PT Visit Diagnosis: Other abnormalities of gait and mobility (R26.89);Muscle weakness (generalized) (M62.81)    Time: 4782-9562 PT Time Calculation (min) (ACUTE ONLY): 21 min   Charges:   PT Evaluation $PT Eval Low Complexity: 1 Low   PT General Charges $$ ACUTE PT VISIT: 1 Visit        Hendricks Limes, PT 04/03/23,  6:05 PM

## 2023-04-03 NOTE — Consult Note (Signed)
Pharmacy Consult Note - Anticoagulation  Pharmacy Consult for warfarin Indication: atrial fibrillation  PATIENT MEASUREMENTS: Height: 6\' 1"  (185.4 cm) Weight: 73.5 kg (162 lb) IBW/kg (Calculated) : 79.9 HEPARIN DW (KG): 73.5  VITAL SIGNS: Temp: 98.2 F (36.8 C) (11/09 0532) Temp Source: Oral (11/09 0532) BP: 129/69 (11/09 1000) Pulse Rate: 77 (11/09 1000)  Recent Labs    04/02/23 1441 04/03/23 0647  HGB  --  10.8*  HCT  --  32.7*  PLT  --  213  LABPROT  --  28.4*  INR  --  2.6*  CREATININE  --  0.61  TROPONINIHS 20*  --     Estimated Creatinine Clearance: 93.2 mL/min (by C-G formula based on SCr of 0.61 mg/dL).  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   Atrial fibrillation (HCC)    Cervical spinal stenosis    with neuropathy   CHF (congestive heart failure) (HCC)    Constipation    COPD (chronic obstructive pulmonary disease) (HCC)    COPD with acute exacerbation (HCC) 10/13/2016   Coronary artery disease    Depression    Dyspnea    Dysrhythmia    atrial fibrillation   Emphysema of lung (HCC)    Factor 5 Leiden mutation, heterozygous (HCC)    on coumadin   Factor V Leiden mutation (HCC)    GERD (gastroesophageal reflux disease)    Hypertension    Lung nodule seen on imaging study    being followed by dr. Belia Heman. just watching it for last few years, without change   Mitral valve insufficiency    Moderate tricuspid insufficiency    Myocardial infarction [facility]) 2004   stent placed, pacemaker implanted 2005   Osteomyelitis (HCC) 10/2019   left foot   Oxygen dependent    requires 2L nasal prong oxygen 24 hours a day   Peripheral vascular disease (HCC)    Presence of permanent cardiac pacemaker [phone]   PVD  (peripheral vascular disease) (HCC)    Sleep apnea    waiting to have sleep study. Used bipap while hospitalized and said it was great for him.    ASSESSMENT: 67 y.o. male with PMH Afib, COPD is presenting with chest pain and cough. Patient is on chronic anticoagulation with warfarin for Afib. HR currently controlled and CHADSVASc is at least 4 (age, CHF, HTN, CAD). Pharmacy has been consulted to initiate and manage heparin intravenous infusion.  Pertinent medications: Warfarin 4 mg daily Total weekly warfarin: 28 mg Last dose on 11/7  Interacting medications: n/a   Goal(s) of therapy: INR 2 - 3 Monitor platelets by anticoagulation protocol: Yes   Baseline anticoagulation labs: Recent Labs    04/02/23 1225 04/03/23 0647  INR 2.4* 2.6*  HGB 12.5* 10.8*  PLT 241 213    Date INR Warfarin Dose 11/08 2.4 4 mg 11/9 2.6  PLAN: Give warfarin 4 mg PO x 1 today, in accordance with home regimen Daily INR Continue to monitor for new-start medications that may interact with warfarin Check CBC at least every 7 days for inpatients on warfarin   Bari Mantis PharmD Clinical Pharmacist 04/03/2023

## 2023-04-04 DIAGNOSIS — I5022 Chronic systolic (congestive) heart failure: Secondary | ICD-10-CM | POA: Diagnosis not present

## 2023-04-04 DIAGNOSIS — J9621 Acute and chronic respiratory failure with hypoxia: Secondary | ICD-10-CM | POA: Diagnosis not present

## 2023-04-04 DIAGNOSIS — U071 COVID-19: Secondary | ICD-10-CM | POA: Diagnosis not present

## 2023-04-04 DIAGNOSIS — J441 Chronic obstructive pulmonary disease with (acute) exacerbation: Secondary | ICD-10-CM | POA: Diagnosis not present

## 2023-04-04 LAB — CBC
HCT: 33 % — ABNORMAL LOW (ref 39.0–52.0)
Hemoglobin: 11.2 g/dL — ABNORMAL LOW (ref 13.0–17.0)
MCH: 31.1 pg (ref 26.0–34.0)
MCHC: 33.9 g/dL (ref 30.0–36.0)
MCV: 91.7 fL (ref 80.0–100.0)
Platelets: 228 10*3/uL (ref 150–400)
RBC: 3.6 MIL/uL — ABNORMAL LOW (ref 4.22–5.81)
RDW: 13.6 % (ref 11.5–15.5)
WBC: 9.3 10*3/uL (ref 4.0–10.5)
nRBC: 0 % (ref 0.0–0.2)

## 2023-04-04 LAB — PROTIME-INR
INR: 3.9 — ABNORMAL HIGH (ref 0.8–1.2)
Prothrombin Time: 38.2 s — ABNORMAL HIGH (ref 11.4–15.2)

## 2023-04-04 MED ORDER — ROFLUMILAST 500 MCG PO TABS
500.0000 ug | ORAL_TABLET | Freq: Every day | ORAL | Status: DC
  Administered 2023-04-04 – 2023-04-06 (×3): 500 ug via ORAL
  Filled 2023-04-04 (×5): qty 1

## 2023-04-04 MED ORDER — METHYLPREDNISOLONE SODIUM SUCC 40 MG IJ SOLR
40.0000 mg | Freq: Once | INTRAMUSCULAR | Status: AC
  Administered 2023-04-04: 40 mg via INTRAVENOUS
  Filled 2023-04-04: qty 1

## 2023-04-04 MED ORDER — BARICITINIB 2 MG PO TABS
4.0000 mg | ORAL_TABLET | Freq: Every day | ORAL | Status: DC
  Administered 2023-04-04 – 2023-04-05 (×2): 4 mg via ORAL
  Filled 2023-04-04 (×2): qty 2

## 2023-04-04 MED ORDER — METHYLPREDNISOLONE SODIUM SUCC 125 MG IJ SOLR
80.0000 mg | Freq: Every day | INTRAMUSCULAR | Status: DC
  Administered 2023-04-05: 80 mg via INTRAVENOUS
  Filled 2023-04-04: qty 2

## 2023-04-04 MED ORDER — TOCILIZUMAB 400 MG/20ML IV SOLN: 8.0000 mg/kg | Freq: Once | INTRAVENOUS | Status: DC

## 2023-04-04 MED ORDER — MORPHINE SULFATE (PF) 2 MG/ML IV SOLN
1.0000 mg | INTRAVENOUS | Status: DC | PRN
  Administered 2023-04-04 – 2023-04-12 (×20): 1 mg via INTRAVENOUS
  Filled 2023-04-04 (×22): qty 1

## 2023-04-04 NOTE — Plan of Care (Signed)
  Problem: Clinical Measurements: Goal: Ability to maintain clinical measurements within normal limits will improve Outcome: Progressing   Problem: Pain Management: Goal: General experience of comfort will improve Outcome: Progressing   Problem: Safety: Goal: Ability to remain free from injury will improve Outcome: Progressing   Problem: Clinical Measurements: Goal: Respiratory complications will improve Outcome: Progressing   Problem: Clinical Measurements: Goal: Cardiovascular complication will be avoided Outcome: Progressing

## 2023-04-04 NOTE — Progress Notes (Signed)
Progress Note   Patient: Tyler [name] ZOX:[id number] DOB: [birth year] DOA: 04/02/2023     2 DOS: the patient was seen and examined on 04/04/2023   Brief hospital course: 67 year old man with past medical history of chronic respiratory failure on 3 L oxygen, COPD, bronchiectasis, atrial fibrillation on Coumadin, sleep apnea on BiPAP at night, heart failure with reduced ejection fraction, AICD, CAD, hypertension, factor V Leiden mutation, hyperlipidemia, bilateral lower extremity amputations.  He presents to the hospital with respiratory distress coughing up green sputum he was found to be COVID-positive.  11/9.  Patient was tried off the BiPAP a couple times and asked to go back on the BiPAP.  Patient still very tight.  Coughing and wheezing. 11/10.  Patient working to breathe this morning and still on BiPAP.  Little higher with the oxygenation of 40%.  Patient wheezing and short of breath and coughing.  Case discussed with pharmacist and will start baricitinib and increased dose of Solu-Medrol to 80 mg daily.  Assessment and Plan: * Acute on chronic hypoxic respiratory failure [facility]) Patient admitted with respiratory distress and placed on BiPAP.  This morning patient was working to breathe and was on 40% on the BiPAP.  Patient wears the BiPAP at night at home.  Chronically on 3 L.  COVID-19 virus infection Start baricitinib since patient worsening this morning.  Benefits and risks explained.  Increase steroids to 80 mg daily.  COPD with acute exacerbation (HCC) Continue DuoNeb and budesonide nebulizers.  Changed to Solu-Medrol to 80 mg daily.  Chronic systolic CHF (congestive heart failure), NYHA class 3 (HCC) Patient has a history of HFrEF with a EF as low as 30% with improvement to 35-40% on most recent echo.  No evidence of hypervolemia on examination at this time. -Continue Entresto, Toprol, Lasix  AF (paroxysmal atrial fibrillation) (HCC) Pharmacy to dose Coumadin, Toprol for rate  control.  Coronary artery disease Continue Toprol and aspirin.  Sleep apnea BiPAP at bedtime  Essential hypertension, benign Continue Toprol Lasix and Entresto.  BPH (benign prostatic hyperplasia) On Flomax        Subjective: Patient seen this morning and was working to breathe and felt very short of breath.  He wore the BiPAP last night and is on the BiPAP this morning more oxygen than yesterday up to 40%.  Spoke with him about getting IL-6 medication to help break down inflammation with COVID and increasing the steroids.  Physical Exam: Vitals:   04/04/23 0049 04/04/23 0502 04/04/23 0757 04/04/23 0832  BP: 104/68 (!) 97/43  (!) 126/95  Pulse: 75 79  77  Resp: 18 16  (!) 22  Temp: 97.7 F (36.5 C) 98 F (36.7 C)  97.7 F (36.5 C)  TempSrc: Oral Oral  Oral  SpO2: 99% 100% 100% 97%  Weight:      Height:       Physical Exam HENT:     Head: Normocephalic.     Mouth/Throat:     Pharynx: No oropharyngeal exudate.  Eyes:     General: Lids are normal.     Conjunctiva/sclera: Conjunctivae normal.  Cardiovascular:     Rate and Rhythm: Normal rate and regular rhythm.     Heart sounds: Normal heart sounds, S1 normal and S2 normal.  Pulmonary:     Effort: Accessory muscle usage present.     Breath sounds: Examination of the right-middle field reveals decreased breath sounds and wheezing. Examination of the left-middle field reveals decreased breath sounds and wheezing. Examination  of the right-lower field reveals decreased breath sounds and wheezing. Examination of the left-lower field reveals decreased breath sounds and wheezing. Decreased breath sounds and wheezing present. No rhonchi or rales.  Abdominal:     Palpations: Abdomen is soft.     Tenderness: There is no abdominal tenderness.  Musculoskeletal:     Right Lower Extremity: Right leg is amputated above knee.     Left Lower Extremity: Left leg is amputated below knee.  Skin:    General: Skin is warm.      Comments: Bruising upper extremities  Neurological:     Mental Status: He is alert and oriented to person, place, and time.     Data Reviewed: Creatinine 0.61, hemoglobin 11.2  Family Communication: Spoke with wife on the phone  Disposition: Status is: Inpatient Remains inpatient appropriate because: Patient on continuous BiPAP this morning.  Starting baricitinib for severe COVID infection.  Planned Discharge Destination: Home with Home Health    Time spent: 30 minutes, patient is critically ill.  Receiving continuous BiPAP this morning up to 40% FiO2 today.  Will increase Solu-Medrol to 80 mg daily and start baricitinib for severe COVID infection.  Benefits and risk explained.  High risk for intubation.  Author: Alford Highland, MD 04/04/2023 12:06 PM  For on call review [email or web address].

## 2023-04-04 NOTE — Progress Notes (Signed)
Pulmonary Medicine          Date: 04/04/2023,   MRN# [id number] Tyler [name] January 29, 1956     AdmissionWeight: 73.5 kg                 CurrentWeight: 73.5 kg       HISTORY OF PRESENT ILLNESS   Today he is still quite tight, was on bipap most of the day, ask to have fio2 increased 4 liters. Sats 100 %. Dyspneic with minimum activity. Told him we can come back down on the fio2 to 3 liters. No chest pain, edema or calf pain. No fever or chills. No sweats. No ectopy, stiff neck or rashes. 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   Atrial fibrillation (HCC)    Cervical spinal stenosis    with neuropathy   CHF (congestive heart failure) (HCC)    Constipation    COPD (chronic obstructive pulmonary disease) (HCC)    COPD with acute exacerbation (HCC) 10/13/2016   Coronary artery disease    Depression    Dyspnea    Dysrhythmia    atrial fibrillation   Emphysema of lung (HCC)    Factor 5 Leiden mutation, heterozygous (HCC)    on coumadin   Factor V Leiden mutation (HCC)    GERD (gastroesophageal reflux disease)    Hypertension    Lung nodule seen on imaging study    being followed by dr. Belia Heman. just watching it for last few years, without change   Mitral valve insufficiency    Moderate tricuspid insufficiency    Myocardial infarction [place]) 2004   stent placed, pacemaker implanted 2005   Osteomyelitis (HCC) 10/2019   left foot   Oxygen dependent    requires 2L nasal prong oxygen 24 hours a day   Peripheral vascular disease (HCC)    Presence of permanent cardiac pacemaker [phone]   PVD (peripheral vascular disease) (HCC)    Sleep apnea    waiting  to have sleep study. Used bipap while hospitalized and said it was great for him.     SURGICAL HISTORY   Past Surgical History:  Procedure Laterality Date   ABOVE KNEE LEG AMPUTATION Right    after below the knee amputation    AMPUTATION Left 11/22/2019   Procedure: AMPUTATION BELOW KNEE;  Surgeon: Annice Needy, MD;  Location: [facility] ORS;  Service: Vascular;  Laterality: Left;   AMPUTATION TOE Left 08/04/2019   Procedure: AMPUTATION TOE MPJ LEFT;  Surgeon: Gwyneth Revels, DPM;  Location: [facility];  Service: Podiatry;  Laterality: Left;   AMPUTATION TOE Left 10/06/2019   Procedure: AMPUTATION TOE MPJ T1,T2 LEFT;  Surgeon: Gwyneth Revels, DPM;  Location: [facility];  Service: Podiatry;  Laterality: Left;   APPLICATION OF WOUND VAC Left 01/12/2018   Procedure: APPLICATION OF WOUND VAC;  Surgeon: Annice Needy, MD;  Location: [facility];  Service: Vascular;  Laterality: Left;   BELOW KNEE LEG AMPUTATION Right    CATARACT EXTRACTION, BILATERAL Bilateral    COLONOSCOPY N/A 02/19/2020   Procedure: COLONOSCOPY;  Surgeon: Regis Bill, MD;  Location: [facility] ENDOSCOPY;  Service: Endoscopy;  Laterality: N/A;  COLONOSCOPY WITH PROPOFOL N/A 08/27/2020   Procedure: COLONOSCOPY WITH PROPOFOL;  Surgeon: Regis Bill, MD;  Location: [facility] ENDOSCOPY;  Service: Endoscopy;  Laterality: N/A;  REQ MID AM   CORONARY ANGIOPLASTY  2005   stent x 1 placed   ENDARTERECTOMY FEMORAL Left 08/11/2017   Procedure: ENDARTERECTOMY FEMORAL;  Surgeon: Annice Needy, MD;  Location: [facility];  Service: Vascular;  Laterality: Left;   ESOPHAGOGASTRODUODENOSCOPY N/A 02/19/2020   Procedure: ESOPHAGOGASTRODUODENOSCOPY (EGD);  Surgeon: Regis Bill, MD;  Location: [facility] ENDOSCOPY;  Service: Endoscopy;  Laterality: N/A;   HEMATOMA EVACUATION Left 01/12/2018   Procedure: EVACUATION HEMATOMA ( DRAINING OF SEROMA);  Surgeon: Annice Needy, MD;  Location: [facility];  Service: Vascular;  Laterality: Left;   IMPLANTABLE CARDIOVERTER  DEFIBRILLATOR (ICD) GENERATOR CHANGE Left 02/10/2017   Procedure: ICD GENERATOR CHANGE;  Surgeon: Marcina Millard, MD;  Location: [facility];  Service: Cardiovascular;  Laterality: Left;   INSERT / REPLACE / REMOVE PACEMAKER  [id number], 2018   LOWER EXTREMITY ANGIOGRAPHY Left 06/07/2017   Procedure: LOWER EXTREMITY ANGIOGRAPHY;  Surgeon: Annice Needy, MD;  Location: [facility] INVASIVE [facility] LAB;  Service: Cardiovascular;  Laterality: Left;   LOWER EXTREMITY ANGIOGRAPHY Left 10/05/2019   Procedure: LOWER EXTREMITY ANGIOGRAPHY;  Surgeon: Annice Needy, MD;  Location: [facility] IN[facility];  Service: Cardiovascular;  Laterality: Left;   LOWER EXTREMITY INTERVENTION  06/07/2017   Procedure: LOWER EXTREMITY INTERVENTION;  Surgeon: Annice Needy, MD;  Location: [facility];  Service: Cardiovascular;;   PERIPHERAL VASCULAR CATHETERIZATION Left 12/09/2015   Procedure: Lower Extremity Angiography;  Surgeon: Annice Needy, MD;  Location: [facility];  Service: Cardiovascular;  Laterality: Left;   PERIPHERAL VASCULAR CATHETERIZATION  12/09/2015   Procedure: Lower Extremity Intervention;  Surgeon: Annice Needy, MD;  Location: [facility];  Service: Cardiovascular;;   TONSILLECTOMY     TRANSMETATARSAL AMPUTATION Left 10/20/2019   Procedure: TRANSMETATARSAL AMPUTATION;  Surgeon: Linus Galas, DPM;  Location: [facility];  Service: Podiatry;  Laterality: Left;     FAMILY HISTORY   Family History  Problem Relation Age of Onset   Cancer Mother    Cancer Father    Heart disease Father      SOCIAL HISTORY   Social History   Tobacco Use   Smoking status: Former    Current packs/day: 0.00    Average packs/day: 1 pack/day for 42.0 years (42.0 ttl pk-yrs)    Types: Cigarettes    Start date: 09/24/1971    Quit date: 09/23/2013    Years since quitting: 9.5   Smokeless tobacco: Never  Vaping Use   Vaping status: Never Used  Substance Use Topics   Alcohol use: No    Comment: quit 6 years ago    Drug use: No     MEDICATIONS    Home Medication:    Current Medication:  Current Facility-Administered Medications:    ALPRAZolam (XANAX) tablet 1 mg, 1 mg, Oral, BID PRN, Verdene Lennert, MD, 1 mg at 04/03/23 2329   aspirin EC tablet 81 mg, 81 mg, Oral, QPC supper, Verdene Lennert, MD, 81 mg at 04/03/23 1820   baricitinib (OLUMIANT) tablet 4 mg, 4 mg, Oral, Daily, Wieting, Richard, MD, 4 mg at 04/04/23 1038   budesonide (PULMICORT) nebulizer solution 0.5 mg, 0.5 mg, Nebulization, Q12H, Wieting, Richard, MD, 0.5 mg at 04/04/23 0757   citalopram (CELEXA) tablet 20 mg, 20 mg, Oral, Daily, Verdene Lennert, MD, 20 mg at 04/04/23 [phone]  docusate sodium (COLACE) capsule 100 mg, 100 mg, Oral, Daily PRN, Verdene Lennert, MD   furosemide (LASIX) tablet 20 mg, 20 mg, Oral, Daily, Verdene Lennert, MD, 20 mg at 04/04/23 0916   gabapentin (NEURONTIN) capsule 300 mg, 300 mg, Oral, TID, Verdene Lennert, MD, 300 mg at 04/04/23 0917   ipratropium-albuterol (DUONEB) 0.5-2.5 (3) MG/3ML nebulizer solution 3 mL, 3 mL, Nebulization, Q6H, Verdene Lennert, MD, 3 mL at 04/04/23 1418   [START ON 04/05/2023] methylPREDNISolone sodium succinate (SOLU-MEDROL) 125 mg/2 mL injection 80 mg, 80 mg, Intravenous, Daily, Wieting, Richard, MD   metoprolol succinate (TOPROL-XL) 24 hr tablet 25 mg, 25 mg, Oral, Daily, Verdene Lennert, MD, 25 mg at 04/04/23 5366   morphine (PF) 2 MG/ML injection 1 mg, 1 mg, Intravenous, Q4H PRN, Alford Highland, MD, 1 mg at 04/04/23 1328   oxyCODONE-acetaminophen (PERCOCET) 7.5-325 MG per tablet 1-2 tablet, 1-2 tablet, Oral, Q6H PRN, Verdene Lennert, MD, 1 tablet at 04/04/23 1446   pantoprazole (PROTONIX) EC tablet 40 mg, 40 mg, Oral, QAC breakfast, Verdene Lennert, MD, 40 mg at 04/04/23 0916   pravastatin (PRAVACHOL) tablet 20 mg, 20 mg, Oral, q1800, Verdene Lennert, MD, 20 mg at 04/03/23 1820   sacubitril-valsartan (ENTRESTO) 24-26 mg per tablet, 1 tablet, Oral, BID, Verdene Lennert, MD, 1  tablet at 04/04/23 4403   sodium chloride flush (NS) 0.9 % injection 3 mL, 3 mL, Intravenous, Q12H, Verdene Lennert, MD, 3 mL at 04/04/23 4742   tamsulosin (FLOMAX) capsule 0.4 mg, 0.4 mg, Oral, QPC breakfast, Verdene Lennert, MD, 0.4 mg at 04/04/23 5956   Warfarin - Pharmacist Dosing Inpatient, , Does not apply, q1600, Orson Aloe, RPH    ALLERGIES   Apixaban and Rivaroxaban     REVIEW OF SYSTEMS    Review of Systems:  Gen:  Denies  fever, sweats, chills weigh loss  HEENT: Denies blurred vision, double vision, ear pain, eye pain, hearing loss, nose bleeds, sore throat Cardiac:  No dizziness, chest pain or heaviness, chest tightness,edema Resp:   less  cough + shortness of breath,+ mild wheezing, no  hemoptysis,  Gi: Denies swallowing difficulty, stomach pain, nausea or vomiting, diarrhea, constipation, bowel incontinence Gu:  Denies bladder incontinence, burning urine Ext:   Denies Joint pain, stiffness or swelling Skin: Denies  skin rash, easy bruising or bleeding or hives Endoc:  Denies polyuria, polydipsia , polyphagia or weight change Psych:   Denies depression, insomnia or hallucinations   Other:  All other systems negative   VS: BP 108/68 (BP Location: Right Arm)   Pulse 75   Temp 98.2 F (36.8 C) (Oral)   Resp (!) 22   Ht 6\' 1"  (1.854 m)   Wt 73.5 kg   SpO2 100%   BMI 21.37 kg/m      PHYSICAL EXAM    GENERAL:NAD, no fevers, chills, no weakness no fatigue HEAD: Normocephalic, atraumatic.  EYES: Pupils equal, round, reactive to light. Extraocular muscles intact. No scleral icterus.  MOUTH: Moist mucosal membrane. Dentition intact. No abscess noted.  EAR, NOSE, THROAT: Clear without exudates. No external lesions.  NECK: Supple. No thyromegaly. No nodules. No JVD.  PULMONARY: Diffuse coarse rhonchi right sided +wheezes CARDIOVASCULAR: S1 and S2. Regular rate and rhythm. No murmurs, rubs, or gallops. No edema. Pedal pulses 2+ bilaterally.   GASTROINTESTINAL: Soft, nontender, nondistended. No masses. Positive bowel sounds. No hepatosplenomegaly.  MUSCULOSKELETAL: No swelling, clubbing, or edema. Range of motion full in all extremities.  NEUROLOGIC: Cranial nerves II through XII are intact. No  gross focal neurological deficits. Sensation intact. Reflexes intact.  SKIN: No ulceration, lesions, rashes, or cyanosis. Skin warm and dry. Turgor intact.  PSYCHIATRIC: Mood, affect within normal limits. The patient is awake, alert and oriented x 3. Insight, judgment intact.       IMAGING    DG Chest Port 1 View  Result Date: 04/02/2023 CLINICAL DATA:  Shortness of breath. EXAM: PORTABLE CHEST 1 VIEW COMPARISON:  March 10, 2022. FINDINGS: Stable cardiomediastinal silhouette. Left-sided defibrillator is unchanged in position. Emphysematous disease is noted in the upper lobes. No acute pulmonary disease. Bony thorax is unremarkable. IMPRESSION: No active disease. Emphysema (ICD10-J43.9). Electronically Signed   By: Lupita Raider M.D.   On: 04/02/2023 15:15      ASSESSMENT/PLAN   This is a pleasant gentleman, presents with dyspnea, covid positive, wheezing, and still relatively tight. C/w copd exacerbation. He was on bipap most of the day ( off now). Not as good as last pm  Does not appear to be in failure. 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 He is aware he still at risk of being intubated.    Covid positive, still tight, fio2 increased. Agree with starting anti viral agent -see below -bancitinab started -up dated his wife   Copd exacerbation, still very tight, but no use of accessory muscles, good color and speaking full sentences -duo neb/ pulmicort/add daliresp, morphine is reasonable to try to suppress the sensation  of dyspnea -solumedrol 80 mg iv bid -maintain sats at 92 % al least  -bipap as indicated   Sleep apnea -bip[ap at night on present settings   Hx of chf, not  in faliure at present. Afib ( rate controlled) -will continue with out patient cardiology recs ( entresto, toprol, asa,warfarin)        Thank you for allowing me to participate in the care of this patient.   Patient/Family are satisfied with care plan and all questions have been answered.  This document was prepared using Dragon voice recognition software and may include unintentional dictation errors.     Ned Clines, M.D.  Division of Pulmonary & Critical Care Medicine  [facility]

## 2023-04-04 NOTE — Consult Note (Signed)
Pharmacy Consult Note - Anticoagulation  Pharmacy Consult for warfarin Indication: atrial fibrillation  PATIENT MEASUREMENTS: Height: 6\' 1"  (185.4 cm) Weight: 73.5 kg (162 lb) IBW/kg (Calculated) : 79.9 HEPARIN DW (KG): 73.5  VITAL SIGNS: Temp: 97.7 F (36.5 C) (11/10 0832) Temp Source: Oral (11/10 0832) BP: 126/95 (11/10 0832) Pulse Rate: 77 (11/10 0832)  Recent Labs    04/02/23 1441 04/03/23 0647 04/04/23 0719  HGB  --  10.8* 11.2*  HCT  --  32.7* 33.0*  PLT  --  213 228  LABPROT  --  28.4* 38.2*  INR  --  2.6* 3.9*  CREATININE  --  0.61  --   TROPONINIHS 20*  --   --     Estimated Creatinine Clearance: 93.2 mL/min (by C-G formula based on SCr of 0.61 mg/dL).  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   Atrial fibrillation (HCC)    Cervical spinal stenosis    with neuropathy   CHF (congestive heart failure) (HCC)    Constipation    COPD (chronic obstructive pulmonary disease) (HCC)    COPD with acute exacerbation (HCC) 10/13/2016   Coronary artery disease    Depression    Dyspnea    Dysrhythmia    atrial fibrillation   Emphysema of lung (HCC)    Factor 5 Leiden mutation, heterozygous (HCC)    on coumadin   Factor V Leiden mutation (HCC)    GERD (gastroesophageal reflux disease)    Hypertension    Lung nodule seen on imaging study    being followed by dr. Belia Heman. just watching it for last few years, without change   Mitral valve insufficiency    Moderate tricuspid insufficiency    Myocardial infarction [facility]) 2004   stent placed, pacemaker implanted 2005   Osteomyelitis (HCC) 10/2019   left foot   Oxygen dependent    requires 2L nasal prong oxygen 24 hours a day   Peripheral vascular disease (HCC)    Presence of  permanent cardiac pacemaker [phone]   PVD (peripheral vascular disease) (HCC)    Sleep apnea    waiting to have sleep study. Used bipap while hospitalized and said it was great for him.    ASSESSMENT: 67 y.o. male with PMH Afib, COPD is presenting with chest pain and cough. Patient is on chronic anticoagulation with warfarin for Afib. HR currently controlled and CHADSVASc is at least 4 (age, CHF, HTN, CAD). Pharmacy has been consulted to initiate and manage heparin intravenous infusion.  Pertinent medications: Warfarin 4 mg daily Total weekly warfarin: 28 mg Last dose on 11/7  Interacting medications: Azithromycin x1 11/8, methylprednisolone(steroids)   Goal(s) of therapy: INR 2 - 3 Monitor platelets by anticoagulation protocol: Yes   Baseline anticoagulation labs: Recent Labs    04/02/23 1225 04/03/23 0647 04/04/23 0719  INR 2.4* 2.6* 3.9*  HGB 12.5* 10.8* 11.2*  PLT 241 213 228    Date INR Warfarin Dose 11/08 2.4 4 mg 11/9 2.6 4 mg 11/10 3.9 HOLD   PLAN: INR supratherapeutic- hold warfarin DDI: Azithromycin x1 11/8, methylprednisolone(steroids), Covid + Daily INR Continue to monitor for new-start medications that may interact with warfarin Check CBC at least every 7 days for inpatients on warfarin   Bari Mantis PharmD Clinical Pharmacist  04/04/2023   

## 2023-04-05 DIAGNOSIS — U071 COVID-19: Secondary | ICD-10-CM | POA: Diagnosis not present

## 2023-04-05 DIAGNOSIS — J441 Chronic obstructive pulmonary disease with (acute) exacerbation: Secondary | ICD-10-CM | POA: Diagnosis not present

## 2023-04-05 DIAGNOSIS — I5022 Chronic systolic (congestive) heart failure: Secondary | ICD-10-CM | POA: Diagnosis not present

## 2023-04-05 DIAGNOSIS — J9621 Acute and chronic respiratory failure with hypoxia: Secondary | ICD-10-CM | POA: Diagnosis not present

## 2023-04-05 LAB — PROTIME-INR
INR: 3.2 — ABNORMAL HIGH (ref 0.8–1.2)
Prothrombin Time: 32.9 s — ABNORMAL HIGH (ref 11.4–15.2)

## 2023-04-05 MED ORDER — AZITHROMYCIN 250 MG PO TABS
500.0000 mg | ORAL_TABLET | Freq: Every day | ORAL | Status: AC
  Administered 2023-04-05 – 2023-04-09 (×5): 500 mg via ORAL
  Filled 2023-04-05 (×5): qty 2

## 2023-04-05 MED ORDER — METOPROLOL SUCCINATE ER 25 MG PO TB24
12.5000 mg | ORAL_TABLET | Freq: Every day | ORAL | Status: DC
  Administered 2023-04-06: 12.5 mg via ORAL
  Filled 2023-04-05: qty 1

## 2023-04-05 MED ORDER — DEXAMETHASONE SODIUM PHOSPHATE 10 MG/ML IJ SOLN
10.0000 mg | Freq: Two times a day (BID) | INTRAMUSCULAR | Status: DC
  Administered 2023-04-05 – 2023-04-07 (×5): 10 mg via INTRAVENOUS
  Filled 2023-04-05 (×5): qty 1

## 2023-04-05 MED ORDER — WARFARIN SODIUM 2 MG PO TABS
2.0000 mg | ORAL_TABLET | Freq: Once | ORAL | Status: AC
  Administered 2023-04-05: 2 mg via ORAL
  Filled 2023-04-05: qty 1

## 2023-04-05 MED ORDER — SODIUM CHLORIDE 0.9 % IV BOLUS
500.0000 mL | Freq: Once | INTRAVENOUS | Status: AC
  Administered 2023-04-05: 500 mL via INTRAVENOUS

## 2023-04-05 NOTE — Plan of Care (Signed)

## 2023-04-05 NOTE — Progress Notes (Signed)
PT Cancellation Note  Patient Details Name: Tyler [name] MRN: [id number] DOB: April 28, 1956   Cancelled Treatment:    Reason Eval/Treat Not Completed: Patient not medically ready. Pt currently is experiencing low BP readings and SOB when performing minimal activity. Due to vitals and s/sx response to mobility PT Tx session is on hold. Communication via Secure Chat with RN to discuss pt's level of appropriateness for Tx and PT session hold.   Antonietta Lansdowne [employer] SPT, LAT, ATC  Saahir Prude Sauv[place]-Howard 04/05/2023, 2:42 PM

## 2023-04-05 NOTE — Care Management Important Message (Signed)
Important Message  Patient Details  Name: Tyler [name] MRN: [id number] Date of Birth: Jan 20, 1956   Important Message Given:  N/A - LOS <3 / Initial given by admissions     [name] [name] 04/05/2023, 2:56 PM

## 2023-04-05 NOTE — Progress Notes (Signed)
Transition of Care [facility]) - Inpatient Brief Assessment   Patient Details  Name: Tyler [name] MRN: [id number] Date of Birth: May 22, 1956  Transition of Care [facility]) CM/SW Contact:    Darolyn Rua, LCSW Phone Number: 04/05/2023, 9:52 AM   Clinical Narrative:  Patient from home presents to ED with difficulty breathing X3 days.   Patient with hx of chronic hypoxic respiratory failure   Patient on chronic 3L O2 baseline  PCP: Dr. Letitia Libra Insurance: [facility] Medicare  Please consult TOC should discharge planning needs arise.   Transition of Care Asessment: Insurance and Status: Insurance coverage has been reviewed Patient has primary care physician: Yes Home environment has been reviewed: from home Prior level of function:: independent Prior/Current Home Services: No current home services Social Determinants of Health Reivew: SDOH reviewed no interventions necessary Readmission risk has been reviewed: Yes Transition of care needs: no transition of care needs at this time

## 2023-04-05 NOTE — Progress Notes (Addendum)
Pulmonary Medicine          Date: 04/05/2023,   MRN# [id number] NOTNAMED Tyler [name] August 02, 1955     AdmissionWeight: 73.5 kg                 CurrentWeight: 73.5 kg       SUBJECTIVE    Patient is sitting up in bed, wife at bedside.  He is on 4L/min .  We will obtain CRP trend and ferritin.  Plan to dc pulmicort, switch steroids to dexamethasone 10 bid.  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   Atrial fibrillation (HCC)    Cervical spinal stenosis    with neuropathy   CHF (congestive heart failure) (HCC)    Constipation    COPD (chronic obstructive pulmonary disease) (HCC)    COPD with acute exacerbation (HCC) 10/13/2016   Coronary artery disease    Depression    Dyspnea    Dysrhythmia    atrial fibrillation   Emphysema of lung (HCC)    Factor 5 Leiden mutation, heterozygous (HCC)    on coumadin   Factor V Leiden mutation (HCC)    GERD (gastroesophageal reflux disease)    Hypertension    Lung nodule seen on imaging study    being followed by dr. Belia Heman. just watching it for last few years, without change   Mitral valve insufficiency    Moderate tricuspid insufficiency    Myocardial infarction [facility]) 2004   stent placed, pacemaker implanted 2005   Osteomyelitis (HCC) 10/2019   left foot   Oxygen dependent    requires 2L nasal prong oxygen 24 hours a day   Peripheral vascular disease (HCC)    Presence of permanent cardiac pacemaker [phone]   PVD (peripheral vascular disease) (HCC)    Sleep apnea    waiting to have sleep study. Used bipap while hospitalized and said it was great for him.     SURGICAL HISTORY   Past Surgical History:  Procedure Laterality Date   ABOVE KNEE  LEG AMPUTATION Right    after below the knee amputation    AMPUTATION Left 11/22/2019   Procedure: AMPUTATION BELOW KNEE;  Surgeon: Annice Needy, MD;  Location: [facility] ORS;  Service: Vascular;  Laterality: Left;   AMPUTATION TOE Left 08/04/2019   Procedure: AMPUTATION TOE MPJ LEFT;  Surgeon: Gwyneth Revels, DPM;  Location: [facility];  Service: Podiatry;  Laterality: Left;   AMPUTATION TOE Left 10/06/2019   Procedure: AMPUTATION TOE MPJ T1,T2 LEFT;  Surgeon: Gwyneth Revels, DPM;  Location: [facility];  Service: Podiatry;  Laterality: Left;   APPLICATION OF WOUND VAC Left 01/12/2018   Procedure: APPLICATION OF WOUND VAC;  Surgeon: Annice Needy, MD;  Location: [facility];  Service: Vascular;  Laterality: Left;   BELOW KNEE LEG AMPUTATION Right    CATARACT EXTRACTION, BILATERAL Bilateral    COLONOSCOPY N/A 02/19/2020   Procedure: COLONOSCOPY;  Surgeon: Regis Bill, MD;  Location: [facility] ENDOS[facility];  Service: Endoscopy;  Laterality: N/A;   COLONOSCOPY WITH PROPOFOL N/A 08/27/2020   Procedure: COLONOSCOPY WITH PROPOFOL;  Surgeon: Regis Bill, MD;  Location: [facility] ENDOS[facility];  Service: Endoscopy;  Laterality: N/A;  REQ MID AM  CORONARY ANGIOPLASTY  2005   stent x 1 placed   ENDARTERECTOMY FEMORAL Left 08/11/2017   Procedure: ENDARTERECTOMY FEMORAL;  Surgeon: Annice Needy, MD;  Location: [facility];  Service: Vascular;  Laterality: Left;   ESOPHAGOGASTRODUODENOSCOPY N/A 02/19/2020   Procedure: ESOPHAGOGASTRODUODENOSCOPY (EGD);  Surgeon: Regis Bill, MD;  Location: [facility] ENDOSCOPY;  Service: Endoscopy;  Laterality: N/A;   HEMATOMA EVACUATION Left 01/12/2018   Procedure: EVACUATION HEMATOMA ( DRAINING OF SEROMA);  Surgeon: Annice Needy, MD;  Location: [facility];  Service: Vascular;  Laterality: Left;   IMPLANTABLE CARDIOVERTER DEFIBRILLATOR (ICD) GENERATOR CHANGE Left 02/10/2017   Procedure: ICD GENERATOR CHANGE;  Surgeon: Marcina Millard, MD;  Location: [facility];  Service: Cardiovascular;   Laterality: Left;   INSERT / REPLACE / REMOVE PACEMAKER  [id number], 2018   LOWER EXTREMITY ANGIOGRAPHY Left 06/07/2017   Procedure: LOWER EXTREMITY ANGIOGRAPHY;  Surgeon: Annice Needy, MD;  Location: [facility] INVAS[facility];  Service: Cardiovascular;  Laterality: Left;   LOWER EXTREMITY ANGIOGRAPHY Left 10/05/2019   Procedure: LOWER EXTREMITY ANGIOGRAPHY;  Surgeon: Annice Needy, MD;  Location: [facility];  Service: Cardiovascular;  Laterality: Left;   LOWER EXTREMITY INTERVENTION  06/07/2017   Procedure: LOWER EXTREMITY INTERVENTION;  Surgeon: Annice Needy, MD;  Location: [facility];  Service: Cardiovascular;;   PERIPHERAL VASCULAR CATHETERIZATION Left 12/09/2015   Procedure: Lower Extremity Angiography;  Surgeon: Annice Needy, MD;  Location: [facility];  Service: Cardiovascular;  Laterality: Left;   PERIPHERAL VASCULAR CATHETERIZATION  12/09/2015   Procedure: Lower Extremity Intervention;  Surgeon: Annice Needy, MD;  Location: [facility];  Service: Cardiovascular;;   TONSILLECTOMY     TRANSMETATARSAL AMPUTATION Left 10/20/2019   Procedure: TRANSMETATARSAL AMPUTATION;  Surgeon: Linus Galas, DPM;  Location: [facility];  Service: Podiatry;  Laterality: Left;     FAMILY HISTORY   Family History  Problem Relation Age of Onset   Cancer Mother    Cancer Father    Heart disease Father      SOCIAL HISTORY   Social History   Tobacco Use   Smoking status: Former    Current packs/day: 0.00    Average packs/day: 1 pack/day for 42.0 years (42.0 ttl pk-yrs)    Types: Cigarettes    Start date: 09/24/1971    Quit date: 09/23/2013    Years since quitting: 9.5   Smokeless tobacco: Never  Vaping Use   Vaping status: Never Used  Substance Use Topics   Alcohol use: No    Comment: quit 6 years ago   Drug use: No     MEDICATIONS    Home Medication:    Current Medication:  Current Facility-Administered Medications:    ALPRAZolam (XANAX) tablet 1 mg, 1 mg,  Oral, BID PRN, Verdene Lennert, MD, 1 mg at 04/04/23 2303   aspirin EC tablet 81 mg, 81 mg, Oral, QPC supper, Verdene Lennert, MD, 81 mg at 04/04/23 1704   baricitinib (OLUMIANT) tablet 4 mg, 4 mg, Oral, Daily, Wieting, Richard, MD, 4 mg at 04/05/23 0945   budesonide (PULMICORT) nebulizer solution 0.5 mg, 0.5 mg, Nebulization, Q12H, Wieting, Richard, MD, 0.5 mg at 04/05/23 0810   citalopram (CELEXA) tablet 20 mg, 20 mg, Oral, Daily, Verdene Lennert, MD, 20 mg at 04/05/23 0944   docusate sodium (COLACE) capsule 100 mg, 100 mg, Oral, Daily PRN, Verdene Lennert, MD   gabapentin (NEURONTIN) capsule 300 mg, 300 mg, Oral, TID, Verdene Lennert, MD, 300 mg at 04/05/23  0944   ipratropium-albuterol (DUONEB) 0.5-2.5 (3) MG/3ML nebulizer solution 3 mL, 3 mL, Nebulization, Q6H, Verdene Lennert, MD, 3 mL at 04/05/23 0750   methylPREDNISolone sodium succinate (SOLU-MEDROL) 125 mg/2 mL injection 80 mg, 80 mg, Intravenous, Daily, Renae Gloss, Richard, MD, 80 mg at 04/05/23 0945   [START ON 04/06/2023] metoprolol succinate (TOPROL-XL) 24 hr tablet 12.5 mg, 12.5 mg, Oral, Daily, Wieting, Richard, MD   morphine (PF) 2 MG/ML injection 1 mg, 1 mg, Intravenous, Q4H PRN, Alford Highland, MD, 1 mg at 04/04/23 1822   oxyCODONE-acetaminophen (PERCOCET) 7.5-325 MG per tablet 1-2 tablet, 1-2 tablet, Oral, Q6H PRN, Verdene Lennert, MD, 1 tablet at 04/05/23 0943   pantoprazole (PROTONIX) EC tablet 40 mg, 40 mg, Oral, QAC breakfast, Verdene Lennert, MD, 40 mg at 04/05/23 0944   pravastatin (PRAVACHOL) tablet 20 mg, 20 mg, Oral, q1800, Verdene Lennert, MD, 20 mg at 04/04/23 1704   roflumilast (DALIRESP) tablet 500 mcg, 500 mcg, Oral, Daily, Meredeth Ide, Herbon E, MD, 500 mcg at 04/05/23 0945   sodium chloride flush (NS) 0.9 % injection 3 mL, 3 mL, Intravenous, Q12H, Verdene Lennert, MD, 3 mL at 04/05/23 0946   tamsulosin (FLOMAX) capsule 0.4 mg, 0.4 mg, Oral, QPC breakfast, Verdene Lennert, MD, 0.4 mg at 04/05/23 0944   warfarin  (COUMADIN) tablet 2 mg, 2 mg, Oral, ONCE-1600, Wieting, Richard, MD   Warfarin - Pharmacist Dosing Inpatient, , Does not apply, q1600, Orson Aloe, RPH    ALLERGIES   Apixaban and Rivaroxaban     REVIEW OF SYSTEMS    Review of Systems:  Gen:  Denies  fever, sweats, chills weigh loss  HEENT: Denies blurred vision, double vision, ear pain, eye pain, hearing loss, nose bleeds, sore throat Cardiac:  No dizziness, chest pain or heaviness, chest tightness,edema Resp:   less  cough + shortness of breath,+ mild wheezing, no  hemoptysis,  Gi: Denies swallowing difficulty, stomach pain, nausea or vomiting, diarrhea, constipation, bowel incontinence Gu:  Denies bladder incontinence, burning urine Ext:   Denies Joint pain, stiffness or swelling Skin: Denies  skin rash, easy bruising or bleeding or hives Endoc:  Denies polyuria, polydipsia , polyphagia or weight change Psych:   Denies depression, insomnia or hallucinations   Other:  All other systems negative   VS: BP (!) 85/54 (BP Location: Left Arm)   Pulse 84   Temp 97.7 F (36.5 C) (Oral)   Resp 18   Ht 6\' 1"  (1.854 m)   Wt 73.5 kg   SpO2 94%   BMI 21.37 kg/m      PHYSICAL EXAM    GENERAL:NAD, no fevers, chills, no weakness no fatigue HEAD: Normocephalic, atraumatic.  EYES: Pupils equal, round, reactive to light. Extraocular muscles intact. No scleral icterus.  MOUTH: Moist mucosal membrane. Dentition intact. No abscess noted.  EAR, NOSE, THROAT: Clear without exudates. No external lesions.  NECK: Supple. No thyromegaly. No nodules. No JVD.  PULMONARY: Diffuse coarse rhonchi right sided +wheezes CARDIOVASCULAR: S1 and S2. Regular rate and rhythm. No murmurs, rubs, or gallops. No edema. Pedal pulses 2+ bilaterally.  GASTROINTESTINAL: Soft, nontender, nondistended. No masses. Positive bowel sounds. No hepatosplenomegaly.  MUSCULOSKELETAL: No swelling, clubbing, or edema. Range of motion full in all extremities.   NEUROLOGIC: Cranial nerves II through XII are intact. No gross focal neurological deficits. Sensation intact. Reflexes intact.  SKIN: No ulceration, lesions, rashes, or cyanosis. Skin warm and dry. Turgor intact.  PSYCHIATRIC: Mood, affect within normal limits. The patient is awake, alert and oriented x  3. Insight, judgment intact.       IMAGING    DG Chest Port 1 View  Result Date: 04/02/2023 CLINICAL DATA:  Shortness of breath. EXAM: PORTABLE CHEST 1 VIEW COMPARISON:  March 10, 2022. FINDINGS: Stable cardiomediastinal silhouette. Left-sided defibrillator is unchanged in position. Emphysematous disease is noted in the upper lobes. No acute pulmonary disease. Bony thorax is unremarkable. IMPRESSION: No active disease. Emphysema (ICD10-J43.9). Electronically Signed   By: Lupita Raider M.D.   On: 04/02/2023 15:15      ASSESSMENT/PLAN   This is a pleasant gentleman, presents with dyspnea, covid positive, wheezing, and still relatively tight. C/w copd exacerbation. He was on bipap most of the day ( off now). Not as good as last pm  Does not appear to be in failure. 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 He is aware he still at risk of being intubated.    Covid positive, still tight, fio2 increased. Agree with starting anti viral agent -see below -up dated his wife   Copd exacerbation, still very tight, but no use of accessory muscles, good color and speaking full sentences -duo neb/ pulmicort/add daliresp, morphine is reasonable to try to suppress the sensation  of dyspnea -solumedrol 80 mg iv bid -maintain sats at 92 % al least  -bipap as indicated   Sleep apnea -bip[ap at night on present settings   Hx of chf, not in faliure at present. Afib ( rate controlled) -will continue with out patient cardiology recs ( entresto, toprol, asa,warfarin)        Thank you for allowing me to participate in the care of this patient.    Patient/Family are satisfied with care plan and all questions have been answered.  This document was prepared using Dragon voice recognition software and may include unintentional dictation errors.    Vida Rigger, M.D.  Pulmonary & Critical Care Medicine  [facility]

## 2023-04-05 NOTE — Consult Note (Signed)
Pharmacy Consult Note - Anticoagulation  Pharmacy Consult for warfarin Indication: atrial fibrillation  PATIENT MEASUREMENTS: Height: 6\' 1"  (185.4 cm) Weight: 73.5 kg (162 lb) IBW/kg (Calculated) : 79.9 HEPARIN DW (KG): 73.5  VITAL SIGNS: Temp: 98 F (36.7 C) (11/11 0440) Temp Source: Oral (11/11 0440) BP: 100/63 (11/11 0440) Pulse Rate: 79 (11/11 0440)  Recent Labs    04/02/23 1441 04/03/23 0647 04/04/23 0719 04/05/23 0642  HGB  --  10.8* 11.2*  --   HCT  --  32.7* 33.0*  --   PLT  --  213 228  --   LABPROT  --  28.4* 38.2* 32.9*  INR  --  2.6* 3.9* 3.2*  CREATININE  --  0.61  --   --   TROPONINIHS 20*  --   --   --     Estimated Creatinine Clearance: 93.2 mL/min (by C-G formula based on SCr of 0.61 mg/dL).  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   Atrial fibrillation (HCC)    Cervical spinal stenosis    with neuropathy   CHF (congestive heart failure) (HCC)    Constipation    COPD (chronic obstructive pulmonary disease) (HCC)    COPD with acute exacerbation (HCC) 10/13/2016   Coronary artery disease    Depression    Dyspnea    Dysrhythmia    atrial fibrillation   Emphysema of lung (HCC)    Factor 5 Leiden mutation, heterozygous (HCC)    on coumadin   Factor V Leiden mutation (HCC)    GERD (gastroesophageal reflux disease)    Hypertension    Lung nodule seen on imaging study    being followed by dr. Belia Heman. just watching it for last few years, without change   Mitral valve insufficiency    Moderate tricuspid insufficiency    Myocardial infarction [facility]) 2004   stent placed, pacemaker implanted 2005   Osteomyelitis (HCC) 10/2019   left foot   Oxygen dependent    requires 2L nasal prong oxygen 24 hours a day    Peripheral vascular disease (HCC)    Presence of permanent cardiac pacemaker [phone]   PVD (peripheral vascular disease) (HCC)    Sleep apnea    waiting to have sleep study. Used bipap while hospitalized and said it was great for him.   Baseline anticoagulation labs: Recent Labs    04/02/23 1225 04/03/23 0647 04/04/23 0719 04/05/23 0642  INR 2.4* 2.6* 3.9* 3.2*  HGB 12.5* 10.8* 11.2*  --   PLT 241 213 228  --    ASSESSMENT: 67 y.o. male with PMH Afib, factor V Leiden mutation, COPD is presenting with chest pain and cough. Patient is on chronic anticoagulation with warfarin for Afib. HR currently controlled and CHADSVASc is at least 4 (age, CHF, HTN, CAD). Pharmacy has been consulted to initiate and manage heparin intravenous infusion.  Warfarin PTA dose 4 mg daily Total weekly warfarin: 28 mg Last dose on 11/7 TTR : unknown  Interacting medications: Azithromycin x1 11/8, methylprednisolone(steroids)  Nov/11: INR remains supra-therapeutic but improved since yesterday, currently on  methylprednisolone taper and baricitinib started Nov/10. Supra-therapeutic INR likely multifactorial with new DDI, Covid +, and HF (watch for fluid status - currently euvolemic).  Goal(s) of therapy: INR 2 - 3  Monitor platelets by anticoagulation protocol: Yes   DATE INR WARFARIN DOSE COMMENTS  11/8 2.4 4mg    11/9 2.6 4mg    11/10 3.9 HELD SUPRA-therapeutic  11/11 3.2     PLAN: Resume warfarin with 2mg  x 1 today at 1600. Continue daily INR for now Check CBC at least every 7 days for inpatients on warfarin  Tyger Oka Rodriguez-Guzman PharmD, BCPS 04/05/2023 8:07 AM

## 2023-04-05 NOTE — Progress Notes (Signed)
Progress Note   Patient: Tyler [name] VWU:[id number] DOB: 1956-01-08 DOA: 04/02/2023     3 DOS: the patient was seen and examined on 04/05/2023   Brief hospital course: 67 year old man with past medical history of chronic respiratory failure on 3 L oxygen, COPD, bronchiectasis, atrial fibrillation on Coumadin, sleep apnea on BiPAP at night, heart failure with reduced ejection fraction, AICD, CAD, hypertension, factor V Leiden mutation, hyperlipidemia, bilateral lower extremity amputations.  He presents to the hospital with respiratory distress coughing up green sputum he was found to be COVID-positive.  11/9.  Patient was tried off the BiPAP a couple times and asked to go back on the BiPAP.  Patient still very tight.  Coughing and wheezing. 11/10.  Patient working to breathe this morning and still on BiPAP.  Little higher with the oxygenation of 40%.  Patient wheezing and short of breath and coughing.  Case discussed with pharmacist and will start baricitinib and increased dose of Solu-Medrol to 80 mg daily. 11/11.  Patient feeling better today than yesterday.  Patient was eating when I saw him and on oxygen.  He stated he came off the BiPAP but had to go on when he had to go to the bathroom.  Breathing better than yesterday.  Continue baricitinib and increased dose of Solu-Medrol.  Assessment and Plan: * Acute on chronic hypoxic respiratory failure [facility]) Patient admitted with respiratory distress and placed on BiPAP.  This morning patient was eating breakfast and on oxygen.  Chronically on 3 L.  COVID-19 virus infection Continue baricitinib since had 2 days on BiPAP continuous.  Continue higher dose Solu-Medrol 80 mg daily.  COPD with acute exacerbation (HCC) Continue DuoNeb and budesonide nebulizers.  Continue Solu-Medrol 80 mg daily.  Chronic systolic CHF (congestive heart failure), NYHA class 3 (HCC) Patient has a history of HFrEF with a EF as low as 30% with improvement to 35-40% on  most recent echo.  No evidence of hypervolemia on examination at this time. -Continue Entresto, Toprol, Lasix  AF (paroxysmal atrial fibrillation) (HCC) Pharmacy to dose Coumadin, Toprol for rate control.  Coronary artery disease Continue Toprol and aspirin.  Sleep apnea BiPAP at bedtime  Essential hypertension, benign Continue Toprol Lasix and Entresto.  BPH (benign prostatic hyperplasia) On Flomax        Subjective: Patient was off the BiPAP when I saw him in eating today.  Patient feels better with regards to his breathing today.  Still has some shortness of breath and cough.  Admitted with respiratory distress and COVID-19 infection.  Physical Exam: Vitals:   04/04/23 2304 04/05/23 0440 04/05/23 0752 04/05/23 0826  BP: 127/74 100/63  113/69  Pulse: 74 79  77  Resp: 12 12  18   Temp: 98.2 F (36.8 C) 98 F (36.7 C)  98.1 F (36.7 C)  TempSrc: Oral Oral  Oral  SpO2: 100% 100% 100% 100%  Weight:      Height:       Physical Exam HENT:     Head: Normocephalic.     Mouth/Throat:     Pharynx: No oropharyngeal exudate.  Eyes:     General: Lids are normal.     Conjunctiva/sclera: Conjunctivae normal.  Cardiovascular:     Rate and Rhythm: Normal rate and regular rhythm.     Heart sounds: Normal heart sounds, S1 normal and S2 normal.  Pulmonary:     Effort: No accessory muscle usage.     Breath sounds: Examination of the right-lower field reveals decreased  breath sounds and wheezing. Examination of the left-lower field reveals decreased breath sounds and wheezing. Decreased breath sounds and wheezing present. No rhonchi or rales.  Abdominal:     Palpations: Abdomen is soft.     Tenderness: There is no abdominal tenderness.  Musculoskeletal:     Right Lower Extremity: Right leg is amputated above knee.     Left Lower Extremity: Left leg is amputated below knee.  Skin:    General: Skin is warm.     Comments: Bruising upper extremities  Neurological:     Mental  Status: He is alert and oriented to person, place, and time.     Data Reviewed: INR 3.2 last hemoglobin 11.2  Family Communication: Updated wife on the phone  Disposition: Status is: Inpatient Remains inpatient appropriate because: Today is the first day he feels a little bit better and came off the continuous BiPAP.  Continue high-dose IV Solu-Medrol and baricitinib.  Planned Discharge Destination: Home    Time spent: 28 minutes  Author: Alford Highland, MD 04/05/2023 11:15 AM  For on call review [email or web address].

## 2023-04-06 ENCOUNTER — Ambulatory Visit: Payer: Self-pay | Admitting: *Deleted

## 2023-04-06 ENCOUNTER — Inpatient Hospital Stay: Payer: Medicare HMO

## 2023-04-06 DIAGNOSIS — J9621 Acute and chronic respiratory failure with hypoxia: Secondary | ICD-10-CM | POA: Diagnosis not present

## 2023-04-06 DIAGNOSIS — J441 Chronic obstructive pulmonary disease with (acute) exacerbation: Secondary | ICD-10-CM | POA: Diagnosis not present

## 2023-04-06 DIAGNOSIS — I5022 Chronic systolic (congestive) heart failure: Secondary | ICD-10-CM | POA: Diagnosis not present

## 2023-04-06 DIAGNOSIS — U071 COVID-19: Secondary | ICD-10-CM | POA: Diagnosis not present

## 2023-04-06 LAB — PROTIME-INR
INR: 2.6 — ABNORMAL HIGH (ref 0.8–1.2)
Prothrombin Time: 28 s — ABNORMAL HIGH (ref 11.4–15.2)

## 2023-04-06 LAB — FERRITIN: Ferritin: 150 ng/mL (ref 24–336)

## 2023-04-06 LAB — C-REACTIVE PROTEIN: CRP: <0.5 mg/dL (ref <1.0)

## 2023-04-06 MED ORDER — SACUBITRIL-VALSARTAN 24-26 MG PO TABS
1.0000 | ORAL_TABLET | Freq: Two times a day (BID) | ORAL | Status: DC
  Administered 2023-04-06 – 2023-04-12 (×12): 1 via ORAL
  Filled 2023-04-06 (×12): qty 1

## 2023-04-06 MED ORDER — FUROSEMIDE 20 MG PO TABS
20.0000 mg | ORAL_TABLET | Freq: Every day | ORAL | Status: DC
  Administered 2023-04-06 – 2023-04-11 (×6): 20 mg via ORAL
  Filled 2023-04-06 (×7): qty 1

## 2023-04-06 MED ORDER — METOPROLOL SUCCINATE ER 25 MG PO TB24
25.0000 mg | ORAL_TABLET | Freq: Every day | ORAL | Status: DC
  Administered 2023-04-07 – 2023-04-12 (×6): 25 mg via ORAL
  Filled 2023-04-06 (×6): qty 1

## 2023-04-06 MED ORDER — WARFARIN SODIUM 2 MG PO TABS
2.0000 mg | ORAL_TABLET | Freq: Once | ORAL | Status: AC
  Administered 2023-04-06: 2 mg via ORAL
  Filled 2023-04-06: qty 1

## 2023-04-06 MED ORDER — METOPROLOL SUCCINATE ER 25 MG PO TB24
12.5000 mg | ORAL_TABLET | Freq: Once | ORAL | Status: AC
  Administered 2023-04-06: 12.5 mg via ORAL
  Filled 2023-04-06: qty 1

## 2023-04-06 NOTE — Progress Notes (Signed)
PT Cancellation Note  Patient Details Name: Tyler [name] MRN: [id number] DOB: December 10, 1955   Cancelled Treatment:    Reason Eval/Treat Not Completed: Fatigue/lethargy limiting ability to participate. Pt received in bed and declined PT session. Pt reports that they have recently transferred to [facility] and back to bed ~1hr ago and is currently too fatigued/SOB to participate in additional mobility. Will re-attempt Tx session at a later date.   Khameron Gruenwald Sauvignon Howard SPT, LAT, ATC   Xavier Munger Sauvignon-Howard 04/06/2023, 9:31 AM

## 2023-04-06 NOTE — Patient Outreach (Signed)
  Care Coordination   Follow Up Visit Note   04/06/2023 Name: ALEZ RIVETTE MRN: [id number] DOB: 21-Apr-1956  JOSEPH CARLSTROM is a 67 y.o. year old male who sees Gracelyn Nurse, MD for primary care.   What matters to the patients health and wellness today?  Noted that patient was admitted to hospital on 11/8 with COPD.  Hospital liaison aware of admission.     Goals Addressed             This Visit's Progress    Effective management of COPD   Not on track    Care Coordination Interventions: Provided patient with basic written and verbal COPD education on self care/management/and exacerbation prevention Advised patient to track and manage COPD triggers Provided instruction about proper use of medications used for management of COPD including inhalers Advised patient to self assesses COPD action plan zone and make appointment with provider if in the yellow zone for 48 hours without improvement  Currently hospitalized        SDOH assessments and interventions completed:  No     Care Coordination Interventions:  Yes, provided   Interventions Today    Flowsheet Row Most Recent Value  Chronic Disease   Chronic disease during today's visit Chronic Obstructive Pulmonary Disease (COPD)  General Interventions   General Interventions Discussed/Reviewed Communication with  Communication with RN  [facility] liaison notified of hospital admission]      Follow up plan:  pending hospital disposition    Encounter Outcome:  Patient Visit Completed   Kemper Durie RN, MSN, CCM [place]  [facility], [facility] RN Care Coordinator Direct Dial: [phone] / Main [phone] Fax [phone] Email: [email or web address] Website: [email or web address]

## 2023-04-06 NOTE — Progress Notes (Signed)
Progress Note   Patient: Tyler [name] XBJ:[id number] DOB: 27-Jan-1956 DOA: 04/02/2023     4 DOS: the patient was seen and examined on 04/06/2023   Brief hospital course: 67 year old man with past medical history of chronic respiratory failure on 3 L oxygen, COPD, bronchiectasis, atrial fibrillation on Coumadin, sleep apnea on BiPAP at night, heart failure with reduced ejection fraction, AICD, CAD, hypertension, factor V Leiden mutation, hyperlipidemia, bilateral lower extremity amputations.  He presents to the hospital with respiratory distress coughing up green sputum he was found to be COVID-positive.  11/9.  Patient was tried off the BiPAP a couple times and asked to go back on the BiPAP.  Patient still very tight.  Coughing and wheezing. 11/10.  Patient working to breathe this morning and still on BiPAP.  Little higher with the oxygenation of 40%.  Patient wheezing and short of breath and coughing.  Case discussed with pharmacist and will start baricitinib and increased dose of Solu-Medrol to 80 mg daily. 11/11.  Patient feeling better today than yesterday.  Patient was eating when I saw him and on oxygen.  He stated he came off the BiPAP but had to go on when he had to go to the bathroom.  Breathing better than yesterday.  Continue baricitinib and increased dose of Solu-Medrol.  Blood pressure was low last night and needed to hold Entresto.  Fluid bolus given. 11/12.  Patient with more trouble breathing today than yesterday.  Blood pressure better today can give usual Toprol and Lasix dose.  Hold off on Entresto.  Assessment and Plan: * Acute on chronic hypoxic respiratory failure [facility]) Patient admitted with respiratory distress and placed on BiPAP.  Today seen and patient was still on BiPAP.  Patient feels worse today than yesterday.  Chronically on 3 L.  COVID-19 virus infection Continue baricitinib since had 2 days on BiPAP continuous upon coming into the hospital.  Today was on BiPAP  when I saw him during the day today.  Continue Decadron 10 mg twice a day.  CRP actually low at 0.5.  COPD with acute exacerbation (HCC) Continue DuoNeb and budesonide nebulizers.  Continue Decadron 10 mg twice a day.  Chronic systolic CHF (congestive heart failure), NYHA class 3 (HCC) Patient has a history of HFrEF with a EF as low as 30% with improvement to 35-40% on most recent echo.  Had low blood pressure yesterday and had to hold Entresto.  Continue to hold [place] today.  Restarted Toprol and Lasix today.  AF (paroxysmal atrial fibrillation) (HCC) Pharmacy to dose Coumadin, Toprol for rate control.  Coronary artery disease Continue Toprol and aspirin.  Sleep apnea BiPAP at bedtime  Essential hypertension, benign Continue Toprol and Lasix.  BPH (benign prostatic hyperplasia) On Flomax        Subjective: Patient put the BiPAP on today because he went to the bathroom.  Having more trouble breathing today than yesterday.  Pulmonary change steroids over to Decadron 10 mg twice a day yesterday.  Physical Exam: Vitals:   04/06/23 0120 04/06/23 0406 04/06/23 0831 04/06/23 1130  BP: 95/62 (!) 128/52 134/71 (!) 147/81  Pulse: 78 74 77 76  Resp: 17 20 18 20   Temp: 98 F (36.7 C) 98.1 F (36.7 C) 97.6 F (36.4 C) 97.8 F (36.6 C)  TempSrc: Axillary Oral  Axillary  SpO2: 100% 100% 99% 96%  Weight:      Height:       Physical Exam HENT:     Head: Normocephalic.  Mouth/Throat:     Pharynx: No oropharyngeal exudate.  Eyes:     General: Lids are normal.     Conjunctiva/sclera: Conjunctivae normal.  Cardiovascular:     Rate and Rhythm: Normal rate and regular rhythm.     Heart sounds: Normal heart sounds, S1 normal and S2 normal.  Pulmonary:     Effort: No accessory muscle usage.     Breath sounds: Examination of the right-middle field reveals decreased breath sounds and wheezing. Examination of the left-middle field reveals decreased breath sounds and wheezing.  Examination of the right-lower field reveals decreased breath sounds and wheezing. Examination of the left-lower field reveals decreased breath sounds and wheezing. Decreased breath sounds and wheezing present. No rhonchi or rales.  Abdominal:     Palpations: Abdomen is soft.     Tenderness: There is no abdominal tenderness.  Musculoskeletal:     Right Lower Extremity: Right leg is amputated above knee.     Left Lower Extremity: Left leg is amputated below knee.  Skin:    General: Skin is warm.     Comments: Bruising upper extremities  Neurological:     Mental Status: He is alert and oriented to person, place, and time.     Data Reviewed: CRP less than 0.5.  Ferritin 150, INR 2.6 Family Communication: Spoke with patient's wife on the phone  Disposition: Status is: Inpatient Remains inpatient appropriate because: More wheezing and trouble breathing today than yesterday.  Planned Discharge Destination: Home    Time spent: 28 minutes  Author: Alford Highland, MD 04/06/2023 1:15 PM  For on call review [email or web address].

## 2023-04-06 NOTE — Progress Notes (Signed)
Since BP 132/60 after restarting lasix and metoprolol, can restart entresto this evening.  Dr Renae Gloss

## 2023-04-06 NOTE — Progress Notes (Addendum)
Occupational Therapy Treatment Patient Details Name: Tyler [name] MRN: [id number] DOB: [birth year] Today's Date: 04/06/2023   History of present illness Pt is a 67 y.o. male presenting to hospital 04/02/23 with c/o difficulty breathing x3 days.  Pt admitted with acute hypoxic respiratory failure, COPD with acute exacerbation. (+) COVID.  PMH includes chronic hypoxic respiratory failure on 3 L supplemental oxygen, COPD, bronchiectasis, a-fib, OSA on BiPAP, HFrEF, CAD, PAD, htn, factor V leiden mutation, HLD, AICD, anxiety, R AKA, L BKA.   OT comments  Chart reviewed to date, pt greeted in room with wife present. Pt declines out of bed activity as pt reports significant SOB while transferring to bsc earlier, reports his oxygen was not monitored at the time. Education provided to pt and wife re: positioning to facilitate increased ease of breathing (pt reports he already lays on his L side), energy conservation techniques, importance of activity to his tolerance. Educated re: monitoring spo2 levels during mobility attempts to provide feedback to patient while he is feeling SOB. Pt is in agreement and with no further questions. Nurse in room at end of treatment session. OT will follow acutely.       If plan is discharge home, recommend the following:  A little help with bathing/dressing/bathroom;A little help with walking and/or transfers;Help with stairs or ramp for entrance;Assistance with cooking/housework   Equipment Recommendations  None recommended by OT    Recommendations for Other Services      Precautions / Restrictions Precautions Precautions: Fall Precaution Comments: Covid+ Restrictions Weight Bearing Restrictions: No Other Position/Activity Restrictions: L BKA prosthesis; R AKA       Mobility Bed Mobility               General bed mobility comments: supine<>long sit with supervision, pt is sitting up in bed unsupported throughout tx session    Transfers                    General transfer comment: deferred on this date, minimally mobilized in bed with supervision     Balance Overall balance assessment: Needs assistance Sitting-balance support: Feet unsupported, No upper extremity supported Sitting balance-Leahy Scale: Good                                     ADL either performed or assessed with clinical judgement   ADL Overall ADL's : Needs assistance/impaired                                       General ADL Comments: pt declined all ADL tasks on this date, stating he was extremely SOB with transfer to bsc earlier on this date, was able to complete with supervision    Extremity/Trunk Assessment              Vision       Perception     Praxis      Cognition Arousal: Alert Behavior During Therapy: [facility] for tasks assessed/performed Overall Cognitive Status: Within Functional Limits for tasks assessed                                          Exercises Other Exercises Other Exercises: education provided to pt and wife  re: positioning for ease of breathing, ADL completion with compensatory techniques/EC; importance of mobility/participation in ADL as tolerated    Shoulder Instructions       General Comments Pt on 4 L via [place] throughout with spo2 >95% throughout, pt with dyspnea when talking to therapist. HR in the 70s bpm throughout;    Pertinent Vitals/ Pain       Pain Assessment Pain Assessment: 0-10 Pain Score: 5  Pain Location: generalized Pain Intervention(s): Monitored during session  Home Living                                          Prior Functioning/Environment              Frequency  Min 1X/week        Progress Toward Goals  OT Goals(current goals can now be found in the care plan section)  Progress towards OT goals: Progressing toward goals     Plan      Co-evaluation                 AM-PAC OT "6 Clicks"  Daily Activity     Outcome Measure   Help from another person eating meals?: None Help from another person taking care of personal grooming?: A Little Help from another person toileting, which includes using toliet, bedpan, or urinal?: A Lot Help from another person bathing (including washing, rinsing, drying)?: A Lot Help from another person to put on and taking off regular upper body clothing?: A Little Help from another person to put on and taking off regular lower body clothing?: A Lot 6 Click Score: 16    End of Session Equipment Utilized During Treatment: Oxygen  OT Visit Diagnosis: Other abnormalities of gait and mobility (R26.89);Unsteadiness on feet (R26.81)   Activity Tolerance Patient limited by fatigue (due to reported SOB with mobility)   Patient Left in bed;with call bell/phone within reach;with nursing/sitter in room;with family/visitor present   Nurse Communication Mobility status (use of flutter valve)        Time: 1610-9604 OT Time Calculation (min): 13 min  Charges: OT General Charges $OT Visit: 1 Visit OT Treatments $Self Care/Home Management : 8-22 mins  Oleta Mouse, OTD OTR/L  04/06/23, 3:51 PM

## 2023-04-06 NOTE — Care Management Important Message (Signed)
Important Message  Patient Details  Name: Tyler [name] MRN: [id number] Date of Birth: [birth year]   Important Message Given:  N/A - LOS <3 / Initial given by admissions     [name] [name] 04/06/2023, 11:29 AM

## 2023-04-06 NOTE — Progress Notes (Addendum)
Pulmonary Medicine          Date: 04/06/2023,   MRN# [id number] Tyler [name] 08-11-1955     AdmissionWeight: 73.5 kg                 CurrentWeight: 73.5 kg       SUBJECTIVE    Patient is sitting up in bed, wife at bedside.  He is on 4L/min .  We will obtain CRP trend and ferritin.  Plan to dc pulmicort, switch steroids to dexamethasone 10 bid.  Starting zithromax 500 for copd.    Patient remains on 4L/min with severe dyspnea even at rest.  Today we discussed brining in his Home medication [place] via nebulizer for use while hospitalized.  Additionally will obtain CXR and diurese interstitial edema due to pan isnspiratory and expiratory wheezing.   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   Atrial fibrillation (HCC)    Cervical spinal stenosis    with neuropathy   CHF (congestive heart failure) (HCC)    Constipation    COPD (chronic obstructive pulmonary disease) (HCC)    COPD with acute exacerbation (HCC) 10/13/2016   Coronary artery disease    Depression    Dyspnea    Dysrhythmia    atrial fibrillation   Emphysema of lung (HCC)    Factor 5 Leiden mutation, heterozygous (HCC)    on coumadin   Factor V Leiden mutation (HCC)    GERD (gastroesophageal reflux disease)    Hypertension    Lung nodule seen on imaging study    being followed by dr. Belia Heman. just watching it for last few years, without change   Mitral valve insufficiency    Moderate tricuspid insufficiency    Myocardial infarction [facility]) 2004   stent placed, pacemaker implanted 2005   Osteomyelitis (HCC) 10/2019   left foot   Oxygen dependent    requires 2L nasal prong oxygen 24 hours a day   Peripheral vascular disease (HCC)    Presence of  permanent cardiac pacemaker [phone]   PVD (peripheral vascular disease) (HCC)    Sleep apnea    waiting to have sleep study. Used bipap while hospitalized and said it was great for him.     SURGICAL HISTORY   Past Surgical History:  Procedure Laterality Date   ABOVE KNEE LEG AMPUTATION Right    after below the knee amputation    AMPUTATION Left 11/22/2019   Procedure: AMPUTATION BELOW KNEE;  Surgeon: Annice Needy, MD;  Location: [facility] ORS;  Service: Vascular;  Laterality: Left;   AMPUTATION TOE Left 08/04/2019   Procedure: AMPUTATION TOE MPJ LEFT;  Surgeon: Gwyneth Revels, DPM;  Location: [facility] OR[facility];  Service: Podiatry;  Laterality: Left;   AMPUTATION TOE Left 10/06/2019   Procedure: AMPUTATION TOE MPJ T1,T2 LEFT;  Surgeon: Gwyneth Revels, DPM;  Location: [facility];  Service: Podiatry;  Laterality: Left;   APPLICATION OF WOUND VAC Left 01/12/2018   Procedure: APPLICATION OF WOUND VAC;  Surgeon: Annice Needy, MD;  Location: [facility] OR[facility];  Service: Vascular;  Laterality: Left;   BELOW KNEE LEG AMPUTATION Right    CATARACT EXTRACTION, BILATERAL Bilateral    COLONOSCOPY N/A 02/19/2020   Procedure: COLONOSCOPY;  Surgeon: Regis Bill,  MD;  Location: [facility] ENDOS[facility];  Service: Endoscopy;  Laterality: N/A;   COLONOSCOPY WITH PROPOFOL N/A 08/27/2020   Procedure: COLONOSCOPY WITH PROPOFOL;  Surgeon: Regis Bill, MD;  Location: [facility] ENDOS[facility];  Service: Endoscopy;  Laterality: N/A;  REQ MID AM   CORONARY ANGIOPLASTY  2005   stent x 1 placed   ENDARTERECTOMY FEMORAL Left 08/11/2017   Procedure: ENDARTERECTOMY FEMORAL;  Surgeon: Annice Needy, MD;  Location: [facility];  Service: Vascular;  Laterality: Left;   ESOPHAGOGASTRODUODENOSCOPY N/A 02/19/2020   Procedure: ESOPHAGOGASTRODUODENOSCOPY (EGD);  Surgeon: Regis Bill, MD;  Location: [facility] ENDOSCOPY;  Service: Endoscopy;  Laterality: N/A;   HEMATOMA EVACUATION Left 01/12/2018   Procedure: EVACUATION HEMATOMA ( DRAINING OF SEROMA);  Surgeon:  Annice Needy, MD;  Location: [facility];  Service: Vascular;  Laterality: Left;   IMPLANTABLE CARDIOVERTER DEFIBRILLATOR (ICD) GENERATOR CHANGE Left 02/10/2017   Procedure: ICD GENERATOR CHANGE;  Surgeon: Marcina Millard, MD;  Location: [facility];  Service: Cardiovascular;  Laterality: Left;   INSERT / REPLACE / REMOVE PACEMAKER  [id number], 2018   LOWER EXTREMITY ANGIOGRAPHY Left 06/07/2017   Procedure: LOWER EXTREMITY ANGIOGRAPHY;  Surgeon: Annice Needy, MD;  Location: [facility] INVASIVE [facility] LAB;  Service: Cardiovascular;  Laterality: Left;   LOWER EXTREMITY ANGIOGRAPHY Left 10/05/2019   Procedure: LOWER EXTREMITY ANGIOGRAPHY;  Surgeon: Annice Needy, MD;  Location: [facility];  Service: Cardiovascular;  Laterality: Left;   LOWER EXTREMITY INTERVENTION  06/07/2017   Procedure: LOWER EXTREMITY INTERVENTION;  Surgeon: Annice Needy, MD;  Location: [facility];  Service: Cardiovascular;;   PERIPHERAL VASCULAR CATHETERIZATION Left 12/09/2015   Procedure: Lower Extremity Angiography;  Surgeon: Annice Needy, MD;  Location: [facility];  Service: Cardiovascular;  Laterality: Left;   PERIPHERAL VASCULAR CATHETERIZATION  12/09/2015   Procedure: Lower Extremity Intervention;  Surgeon: Annice Needy, MD;  Location: [facility];  Service: Cardiovascular;;   TONSILLECTOMY     TRANSMETATARSAL AMPUTATION Left 10/20/2019   Procedure: TRANSMETATARSAL AMPUTATION;  Surgeon: Linus Galas, DPM;  Location: [facility];  Service: Podiatry;  Laterality: Left;     FAMILY HISTORY   Family History  Problem Relation Age of Onset   Cancer Mother    Cancer Father    Heart disease Father      SOCIAL HISTORY   Social History   Tobacco Use   Smoking status: Former    Current packs/day: 0.00    Average packs/day: 1 pack/day for 42.0 years (42.0 ttl pk-yrs)    Types: Cigarettes    Start date: 09/24/1971    Quit date: 09/23/2013    Years since quitting: 9.5   Smokeless tobacco: Never  Vaping Use    Vaping status: Never Used  Substance Use Topics   Alcohol use: No    Comment: quit 6 years ago   Drug use: No     MEDICATIONS    Home Medication:    Current Medication:  Current Facility-Administered Medications:    ALPRAZolam (XANAX) tablet 1 mg, 1 mg, Oral, BID PRN, Verdene Lennert, MD, 1 mg at 04/05/23 2200   aspirin EC tablet 81 mg, 81 mg, Oral, QPC supper, Verdene Lennert, MD, 81 mg at 04/05/23 1753   azithromycin (ZITHROMAX) tablet 500 mg, 500 mg, Oral, Daily, Elidia Bonenfant, MD, 500 mg at 04/06/23 1001   citalopram (CELEXA) tablet 20 mg, 20 mg, Oral, Daily, Verdene Lennert, MD, 20 mg at 04/06/23 1008   dexamethasone (DECADRON) injection 10 mg, 10  mg, Intravenous, Q12H, Vida Rigger, MD, 10 mg at 04/06/23 1004   docusate sodium (COLACE) capsule 100 mg, 100 mg, Oral, Daily PRN, Verdene Lennert, MD   furosemide (LASIX) tablet 20 mg, 20 mg, Oral, Daily, Wieting, Richard, MD   gabapentin (NEURONTIN) capsule 300 mg, 300 mg, Oral, TID, Verdene Lennert, MD, 300 mg at 04/06/23 1003   ipratropium-albuterol (DUONEB) 0.5-2.5 (3) MG/3ML nebulizer solution 3 mL, 3 mL, Nebulization, Q6H, Verdene Lennert, MD, 3 mL at 04/06/23 0725   metoprolol succinate (TOPROL-XL) 24 hr tablet 12.5 mg, 12.5 mg, Oral, Once, Alford Highland, MD   Melene Muller ON 04/07/2023] metoprolol succinate (TOPROL-XL) 24 hr tablet 25 mg, 25 mg, Oral, Daily, Wieting, Richard, MD   morphine (PF) 2 MG/ML injection 1 mg, 1 mg, Intravenous, Q4H PRN, Alford Highland, MD, 1 mg at 04/04/23 1822   oxyCODONE-acetaminophen (PERCOCET) 7.5-325 MG per tablet 1-2 tablet, 1-2 tablet, Oral, Q6H PRN, Verdene Lennert, MD, 2 tablet at 04/06/23 1007   pantoprazole (PROTONIX) EC tablet 40 mg, 40 mg, Oral, QAC breakfast, Verdene Lennert, MD, 40 mg at 04/06/23 1000   pravastatin (PRAVACHOL) tablet 20 mg, 20 mg, Oral, q1800, Verdene Lennert, MD, 20 mg at 04/05/23 1753   roflumilast (DALIRESP) tablet 500 mcg, 500 mcg, Oral, Daily, Meredeth Ide, Herbon  E, MD, 500 mcg at 04/06/23 1008   sodium chloride flush (NS) 0.9 % injection 3 mL, 3 mL, Intravenous, Q12H, Verdene Lennert, MD, 3 mL at 04/05/23 2117   tamsulosin (FLOMAX) capsule 0.4 mg, 0.4 mg, Oral, QPC breakfast, Verdene Lennert, MD, 0.4 mg at 04/06/23 1002   warfarin (COUMADIN) tablet 2 mg, 2 mg, Oral, ONCE-1600, Dobbs, Harlow Ohms, RPH   Warfarin - Pharmacist Dosing Inpatient, , Does not apply, q1600, Orson Aloe, RPH    ALLERGIES   Apixaban and Rivaroxaban     REVIEW OF SYSTEMS    Review of Systems:  Gen:  Denies  fever, sweats, chills weigh loss  HEENT: Denies blurred vision, double vision, ear pain, eye pain, hearing loss, nose bleeds, sore throat Cardiac:  No dizziness, chest pain or heaviness, chest tightness,edema Resp:   less  cough + shortness of breath,+ mild wheezing, no  hemoptysis,  Gi: Denies swallowing difficulty, stomach pain, nausea or vomiting, diarrhea, constipation, bowel incontinence Gu:  Denies bladder incontinence, burning urine Ext:   Denies Joint pain, stiffness or swelling Skin: Denies  skin rash, easy bruising or bleeding or hives Endoc:  Denies polyuria, polydipsia , polyphagia or weight change Psych:   Denies depression, insomnia or hallucinations   Other:  All other systems negative   VS: BP (!) 147/81 (BP Location: Left Arm)   Pulse 76   Temp 97.8 F (36.6 C) (Axillary)   Resp 20   Ht 6\' 1"  (1.854 m)   Wt 73.5 kg   SpO2 96%   BMI 21.37 kg/m      PHYSICAL EXAM    GENERAL:NAD, no fevers, chills, no weakness no fatigue HEAD: Normocephalic, atraumatic.  EYES: Pupils equal, round, reactive to light. Extraocular muscles intact. No scleral icterus.  MOUTH: Moist mucosal membrane. Dentition intact. No abscess noted.  EAR, NOSE, THROAT: Clear without exudates. No external lesions.  NECK: Supple. No thyromegaly. No nodules. No JVD.  PULMONARY: Diffuse coarse rhonchi right sided +wheezes CARDIOVASCULAR: S1 and S2. Regular rate  and rhythm. No murmurs, rubs, or gallops. No edema. Pedal pulses 2+ bilaterally.  GASTROINTESTINAL: Soft, nontender, nondistended. No masses. Positive bowel sounds. No hepatosplenomegaly.  MUSCULOSKELETAL: No swelling, clubbing, or edema. Range  of motion full in all extremities.  NEUROLOGIC: Cranial nerves II through XII are intact. No gross focal neurological deficits. Sensation intact. Reflexes intact.  SKIN: No ulceration, lesions, rashes, or cyanosis. Skin warm and dry. Turgor intact.  PSYCHIATRIC: Mood, affect within normal limits. The patient is awake, alert and oriented x 3. Insight, judgment intact.       IMAGING    DG Chest Port 1 View  Result Date: 04/02/2023 CLINICAL DATA:  Shortness of breath. EXAM: PORTABLE CHEST 1 VIEW COMPARISON:  March 10, 2022. FINDINGS: Stable cardiomediastinal silhouette. Left-sided defibrillator is unchanged in position. Emphysematous disease is noted in the upper lobes. No acute pulmonary disease. Bony thorax is unremarkable. IMPRESSION: No active disease. Emphysema (ICD10-J43.9). Electronically Signed   By: Lupita Raider M.D.   On: 04/02/2023 15:15      ASSESSMENT/PLAN   This is a pleasant gentleman, presents with dyspnea, covid positive, wheezing, and still relatively tight. C/w copd exacerbation. He was on bipap most of the day ( off now). Not as good as last pm  Does not appear to be in failure. 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 He is aware he still at risk of being intubated.    COVID19     Dexamethasone 10 bid      Pcp ppx    Metaneb TID      Copd exacerbation, still very tight, but no use of accessory muscles, good color and speaking full sentences -duo neb/ pulmicort/add daliresp, morphine is reasonable to try to suppress the sensation  of dyspnea -solumedrol 80 mg iv bid -maintain sats at 92 % al least  -bipap as indicated   Sleep apnea -bip[ap at night on present settings    Hx of chf, not in faliure at present. Afib ( rate controlled) -will continue with out patient cardiology recs ( entresto, toprol, asa,warfarin)        Thank you for allowing me to participate in the care of this patient.   Patient/Family are satisfied with care plan and all questions have been answered.  This document was prepared using Dragon voice recognition software and may include unintentional dictation errors.    Vida Rigger, M.D.  Pulmonary & Critical Care Medicine  [facility]

## 2023-04-06 NOTE — Consult Note (Signed)
[facility] Liaison Consult Note   04/06/2023  Tyler [name] Oct 26, 1955 [id number]  Primary Care Provider:   Marcelino Duster [facility])  Patient is currently active with Care Management for chronic disease management services.  Patient has been engaged by a  RNCM.  Our community based plan of care has focused on disease management and community resource support.   Patient will receive a post hospital call and will be evaluated for assessments and disease process education.   Plan: Pending discharge disposition.  Inpatient Transition Of Care [TOC] team member to make aware that Care Management following.  Of note, Care Management services does not replace or interfere with any services that are needed or arranged by inpatient [facility] care management team.   For additional questions or referrals please contact:   [name], RN, [facility] Liaison [place]   [facility], Population Health Office Hours MTWF  8:00 am-6:00 pm Direct Dial: [phone] mobile [phone] [Office toll free line] Office Hours are M-F 8:30 - 5 pm [email or web address]

## 2023-04-06 NOTE — Consult Note (Signed)
Pharmacy Consult Note - Anticoagulation  Pharmacy Consult for warfarin Indication: atrial fibrillation  PATIENT MEASUREMENTS: Height: 6\' 1"  (185.4 cm) Weight: 73.5 kg (162 lb) IBW/kg (Calculated) : 79.9 HEPARIN DW (KG): 73.5  VITAL SIGNS: Temp: 98.1 F (36.7 C) (11/12 0406) Temp Source: Oral (11/12 0406) BP: 128/52 (11/12 0406) Pulse Rate: 74 (11/12 0406)  Recent Labs    04/04/23 0719 04/05/23 0642 04/06/23 0533  HGB 11.2*  --   --   HCT 33.0*  --   --   PLT 228  --   --   LABPROT 38.2*   < > 28.0*  INR 3.9*   < > 2.6*   < > = values in this interval not displayed.    Estimated Creatinine Clearance: 93.2 mL/min (by C-G formula based on SCr of 0.61 mg/dL).  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   Atrial fibrillation (HCC)    Cervical spinal stenosis    with neuropathy   CHF (congestive heart failure) (HCC)    Constipation    COPD (chronic obstructive pulmonary disease) (HCC)    COPD with acute exacerbation (HCC) 10/13/2016   Coronary artery disease    Depression    Dyspnea    Dysrhythmia    atrial fibrillation   Emphysema of lung (HCC)    Factor 5 Leiden mutation, heterozygous (HCC)    on coumadin   Factor V Leiden mutation (HCC)    GERD (gastroesophageal reflux disease)    Hypertension    Lung nodule seen on imaging study    being followed by dr. Belia Heman. just watching it for last few years, without change   Mitral valve insufficiency    Moderate tricuspid insufficiency    Myocardial infarction [facility]) 2004   stent placed, pacemaker implanted 2005   Osteomyelitis (HCC) 10/2019   left foot   Oxygen dependent    requires 2L nasal prong oxygen 24 hours a day   Peripheral vascular disease (HCC)    Presence of  permanent cardiac pacemaker [phone]   PVD (peripheral vascular disease) (HCC)    Sleep apnea    waiting to have sleep study. Used bipap while hospitalized and said it was great for him.   Baseline anticoagulation labs: Recent Labs    04/04/23 0719 04/05/23 0642 04/06/23 0533  INR 3.9* 3.2* 2.6*  HGB 11.2*  --   --   PLT 228  --   --    ASSESSMENT: 67 y.o. male with PMH Afib, factor V Leiden mutation, COPD is presenting with chest pain and cough. Patient is on chronic anticoagulation with warfarin for Afib. HR currently controlled and CHADSVASc is at least 4 (age, CHF, HTN, CAD). Pharmacy has been consulted to initiate and manage heparin intravenous infusion.  Warfarin PTA dose 4 mg daily Total weekly warfarin: 28 mg Last dose on 11/7 TTR : unknown  Interacting medications: Azithromycin x1 11/8, methylprednisolone(steroids)  Nov/12: Patient was therapeutic today. Just started Zithromax yesterday for a COPD exacerbation. They have history of factor V Leiden mutation that puts them at risk for clotting, they are also volatile in their INR fluctuations. Recommend 2 mg of warfarin today and access tomorrow.   Goal(s) of therapy: INR 2 - 3 Monitor platelets by anticoagulation protocol: Yes  DATE INR WARFARIN DOSE COMMENTS  11/8 2.4 4mg    11/9 2.6 4mg    11/10 3.9 HELD SUPRA-therapeutic  11/11 3.2 2 mg    11/12 2.6  Therapeutic    PLAN: Continue warfarin 2 mg x 1 today at 1600. Continue daily INR  Check CBC at least every 7 days for inpatients on warfarin  Effie Shy, PharmD Pharmacy Resident  04/06/2023 7:48 AM

## 2023-04-06 NOTE — Progress Notes (Signed)
PT Cancellation Note  Patient Details Name: Tyler [name] MRN: [id number] DOB: 06-28-1955   Cancelled Treatment:    Reason Eval/Treat Not Completed: Fatigue/lethargy limiting ability to participate;Patient declined, no reason specified. Pt received in bed with family member at bedside and declined PT session. Pt reports that they do not want to become OOB again as they have already been up to use BSC so therefore refused mobility. Will re-attempt Tx session at a later date.  Santez Woodcox [employer] SPT, LAT, ATC  Rosamund Nyland Sauvignon-Howard 04/06/2023, 3:18 PM

## 2023-04-07 DIAGNOSIS — Z7189 Other specified counseling: Secondary | ICD-10-CM

## 2023-04-07 DIAGNOSIS — J441 Chronic obstructive pulmonary disease with (acute) exacerbation: Secondary | ICD-10-CM | POA: Diagnosis not present

## 2023-04-07 DIAGNOSIS — U071 COVID-19: Secondary | ICD-10-CM | POA: Diagnosis not present

## 2023-04-07 DIAGNOSIS — J9621 Acute and chronic respiratory failure with hypoxia: Secondary | ICD-10-CM | POA: Diagnosis not present

## 2023-04-07 LAB — CBC
HCT: 36.3 % — ABNORMAL LOW (ref 39.0–52.0)
Hemoglobin: 12 g/dL — ABNORMAL LOW (ref 13.0–17.0)
MCH: 30.7 pg (ref 26.0–34.0)
MCHC: 33.1 g/dL (ref 30.0–36.0)
MCV: 92.8 fL (ref 80.0–100.0)
Platelets: 242 10*3/uL (ref 150–400)
RBC: 3.91 MIL/uL — ABNORMAL LOW (ref 4.22–5.81)
RDW: 13.3 % (ref 11.5–15.5)
WBC: 10.2 10*3/uL (ref 4.0–10.5)
nRBC: 0 % (ref 0.0–0.2)

## 2023-04-07 LAB — CULTURE, BLOOD (ROUTINE X 2)
Culture: NO GROWTH
Culture: NO GROWTH
Special Requests: ADEQUATE
Special Requests: ADEQUATE

## 2023-04-07 LAB — BASIC METABOLIC PANEL
Anion gap: 10 (ref 5–15)
BUN: 21 mg/dL (ref 8–23)
CO2: 34 mmol/L — ABNORMAL HIGH (ref 22–32)
Calcium: 9.3 mg/dL (ref 8.9–10.3)
Chloride: 91 mmol/L — ABNORMAL LOW (ref 98–111)
Creatinine, Ser: 0.66 mg/dL (ref 0.61–1.24)
GFR, Estimated: >60 mL/min (ref >60)
Glucose, Bld: 107 mg/dL — ABNORMAL HIGH (ref 70–99)
Potassium: 4.8 mmol/L (ref 3.5–5.1)
Sodium: 135 mmol/L (ref 135–145)

## 2023-04-07 LAB — C-REACTIVE PROTEIN: CRP: 0.5 mg/dL (ref <1.0)

## 2023-04-07 LAB — PROTIME-INR
INR: 2.2 — ABNORMAL HIGH (ref 0.8–1.2)
Prothrombin Time: 24.3 s — ABNORMAL HIGH (ref 11.4–15.2)

## 2023-04-07 LAB — BRAIN NATRIURETIC PEPTIDE: B Natriuretic Peptide: 132 pg/mL — ABNORMAL HIGH (ref 0.0–100.0)

## 2023-04-07 LAB — FERRITIN: Ferritin: 158 ng/mL (ref 24–336)

## 2023-04-07 MED ORDER — ACETYLCYSTEINE 20 % IN SOLN
3.0000 mL | Freq: Two times a day (BID) | RESPIRATORY_TRACT | Status: DC
  Administered 2023-04-07: 3 mL via RESPIRATORY_TRACT
  Filled 2023-04-07 (×12): qty 4

## 2023-04-07 MED ORDER — WARFARIN SODIUM 4 MG PO TABS
4.0000 mg | ORAL_TABLET | Freq: Once | ORAL | Status: AC
  Administered 2023-04-07: 4 mg via ORAL
  Filled 2023-04-07: qty 1

## 2023-04-07 MED ORDER — IPRATROPIUM-ALBUTEROL 0.5-2.5 (3) MG/3ML IN SOLN: 3.0000 mL | RESPIRATORY_TRACT | Status: DC | PRN

## 2023-04-07 MED ORDER — ENSIFENTRINE 3 MG/2.5ML IN SUSP
1.0000 | Freq: Two times a day (BID) | RESPIRATORY_TRACT | Status: DC
  Filled 2023-04-07 (×2): qty 1

## 2023-04-07 MED ORDER — DEXAMETHASONE SODIUM PHOSPHATE 10 MG/ML IJ SOLN
8.0000 mg | Freq: Two times a day (BID) | INTRAMUSCULAR | Status: DC
  Administered 2023-04-07 – 2023-04-10 (×7): 8 mg via INTRAVENOUS
  Filled 2023-04-07 (×7): qty 1

## 2023-04-07 NOTE — Progress Notes (Addendum)
Pulmonary Medicine          Date: 04/07/2023,   MRN# [id number] Tyler [name] [birth year]     AdmissionWeight: 73.5 kg                 CurrentWeight: 73.5 kg       SUBJECTIVE    Patient is sitting up in bed, wife at bedside.  He is on 4L/min .  We will obtain CRP trend and ferritin.  Plan to dc pulmicort, switch steroids to dexamethasone 10 bid.  Starting zithromax 500 for copd.    Patient remains on 4L/min with severe dyspnea even at rest.  Today we discussed brining in his Home medication Otuhaire via nebulizer for use while hospitalized.  Additionally will obtain CXR and diurese interstitial edema due to pan isnspiratory and expiratory wheezing.   04/07/23- patient with overall poor prognosis.  Very slow to improve.  Palliative care consultation for goals of care.  We have brought in home medication for pharmacy to certify.  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   Atrial fibrillation (HCC)    Cervical spinal stenosis    with neuropathy   CHF (congestive heart failure) (HCC)    Constipation    COPD (chronic obstructive pulmonary disease) (HCC)    COPD with acute exacerbation (HCC) 10/13/2016   Coronary artery disease    Depression    Dyspnea    Dysrhythmia    atrial fibrillation   Emphysema of lung (HCC)    Factor 5 Leiden mutation, heterozygous (HCC)    on coumadin   Factor V Leiden mutation (HCC)    GERD (gastroesophageal reflux disease)    Hypertension    Lung nodule seen on imaging study    being followed by dr. Belia Heman. just watching it for last few years, without change   Mitral valve insufficiency    Moderate tricuspid insufficiency    Myocardial infarction [facility]) 2004   stent placed, pacemaker  implanted 2005   Osteomyelitis (HCC) 10/2019   left foot   Oxygen dependent    requires 2L nasal prong oxygen 24 hours a day   Peripheral vascular disease (HCC)    Presence of permanent cardiac pacemaker [phone]   PVD (peripheral vascular disease) (HCC)    Sleep apnea    waiting to have sleep study. Used bipap while hospitalized and said it was great for him.     SURGICAL HISTORY   Past Surgical History:  Procedure Laterality Date   ABOVE KNEE LEG AMPUTATION Right    after below the knee amputation    AMPUTATION Left 11/22/2019   Procedure: AMPUTATION BELOW KNEE;  Surgeon: Annice Needy, MD;  Location: [facility] ORS;  Service: Vascular;  Laterality: Left;   AMPUTATION TOE Left 08/04/2019   Procedure: AMPUTATION TOE MPJ LEFT;  Surgeon: Gwyneth Revels, DPM;  Location: [facility];  Service: Podiatry;  Laterality: Left;   AMPUTATION TOE Left 10/06/2019   Procedure: AMPUTATION TOE MPJ T1,T2 LEFT;  Surgeon: Gwyneth Revels, DPM;  Location: [facility];  Service: Podiatry;  Laterality: Left;   APPLICATION OF WOUND VAC Left 01/12/2018   Procedure: APPLICATION OF WOUND VAC;  Surgeon: Annice Needy, MD;  Location: [facility];  Service: Vascular;  Laterality: Left;   BELOW KNEE LEG AMPUTATION Right    CATARACT EXTRACTION, BILATERAL Bilateral    COLONOSCOPY N/A 02/19/2020   Procedure: COLONOSCOPY;  Surgeon: Regis Bill, MD;  Location: [facility];  Service: Endoscopy;  Laterality: N/A;   COLONOSCOPY WITH PROPOFOL N/A 08/27/2020   Procedure: COLONOSCOPY WITH PROPOFOL;  Surgeon: Regis Bill, MD;  Location: [facility];  Service: Endoscopy;  Laterality: N/A;  REQ MID AM   CORONARY ANGIOPLASTY  2005   stent x 1 placed   ENDARTERECTOMY FEMORAL Left 08/11/2017   Procedure: ENDARTERECTOMY FEMORAL;  Surgeon: Annice Needy, MD;  Location: [facility];  Service: Vascular;  Laterality: Left;   ESOPHAGOGASTRODUODENOSCOPY N/A 02/19/2020   Procedure: ESOPHAGOGASTRODUODENOSCOPY (EGD);  Surgeon: Regis Bill, MD;  Location: [facility];  Service: Endoscopy;  Laterality: N/A;   HEMATOMA EVACUATION Left 01/12/2018   Procedure: EVACUATION HEMATOMA ( DRAINING OF SEROMA);  Surgeon: Annice Needy, MD;  Location: [facility];  Service: Vascular;  Laterality: Left;   IMPLANTABLE CARDIOVERTER DEFIBRILLATOR (ICD) GENERATOR CHANGE Left 02/10/2017   Procedure: ICD GENERATOR CHANGE;  Surgeon: Marcina Millard, MD;  Location: [facility];  Service: Cardiovascular;  Laterality: Left;   INSERT / REPLACE / REMOVE PACEMAKER  [id number], 2018   LOWER EXTREMITY ANGIOGRAPHY Left 06/07/2017   Procedure: LOWER EXTREMITY ANGIOGRAPHY;  Surgeon: Annice Needy, MD;  Location: [facility];  Service: Cardiovascular;  Laterality: Left;   LOWER EXTREMITY ANGIOGRAPHY Left 10/05/2019   Procedure: LOWER EXTREMITY ANGIOGRAPHY;  Surgeon: Annice Needy, MD;  Location: [facility];  Service: Cardiovascular;  Laterality: Left;   LOWER EXTREMITY INTERVENTION  06/07/2017   Procedure: LOWER EXTREMITY INTERVENTION;  Surgeon: Annice Needy, MD;  Location: [facility];  Service: Cardiovascular;;   PERIPHERAL VASCULAR CATHETERIZATION Left 12/09/2015   Procedure: Lower Extremity Angiography;  Surgeon: Annice Needy, MD;  Location: [facility];  Service: Cardiovascular;  Laterality: Left;   PERIPHERAL VASCULAR CATHETERIZATION  12/09/2015   Procedure: Lower Extremity Intervention;  Surgeon: Annice Needy, MD;  Location: [facility];  Service: Cardiovascular;;   TONSILLECTOMY     TRANSMETATARSAL AMPUTATION Left 10/20/2019   Procedure: TRANSMETATARSAL AMPUTATION;  Surgeon: Linus Galas, DPM;  Location: [facility];  Service: Podiatry;  Laterality: Left;     FAMILY HISTORY   Family History  Problem Relation Age of Onset   Cancer Mother    Cancer Father    Heart disease Father      SOCIAL HISTORY   Social History   Tobacco Use   Smoking status: Former    Current packs/day: 0.00    Average packs/day:  1 pack/day for 42.0 years (42.0 ttl pk-yrs)    Types: Cigarettes    Start date: 09/24/1971    Quit date: 09/23/2013    Years since quitting: 9.5   Smokeless tobacco: Never  Vaping Use   Vaping status: Never Used  Substance Use Topics   Alcohol use: No    Comment: quit 6 years ago   Drug use: No     MEDICATIONS    Home Medication:    Current Medication:  Current Facility-Administered Medications:    ALPRAZolam (XANAX) tablet 1 mg, 1 mg, Oral, BID PRN, Verdene Lennert, MD, 1 mg at 04/07/23 0835   aspirin EC tablet 81 mg, 81 mg, Oral, QPC supper, Verdene Lennert, MD, 81 mg at 04/06/23 1721   azithromycin (ZITHROMAX) tablet 500 mg, 500 mg, Oral, Daily, Vida Rigger, MD, 500  mg at 04/06/23 1001   citalopram (CELEXA) tablet 20 mg, 20 mg, Oral, Daily, Verdene Lennert, MD, 20 mg at 04/06/23 1008   dexamethasone (DECADRON) injection 10 mg, 10 mg, Intravenous, Q12H, Vida Rigger, MD, 10 mg at 04/06/23 2048   docusate sodium (COLACE) capsule 100 mg, 100 mg, Oral, Daily PRN, Verdene Lennert, MD   furosemide (LASIX) tablet 20 mg, 20 mg, Oral, Daily, Renae Gloss, Richard, MD, 20 mg at 04/06/23 1407   gabapentin (NEURONTIN) capsule 300 mg, 300 mg, Oral, TID, Verdene Lennert, MD, 300 mg at 04/06/23 2049   ipratropium-albuterol (DUONEB) 0.5-2.5 (3) MG/3ML nebulizer solution 3 mL, 3 mL, Nebulization, Q6H, Verdene Lennert, MD, 3 mL at 04/07/23 0747   metoprolol succinate (TOPROL-XL) 24 hr tablet 25 mg, 25 mg, Oral, Daily, Wieting, Richard, MD   morphine (PF) 2 MG/ML injection 1 mg, 1 mg, Intravenous, Q4H PRN, Alford Highland, MD, 1 mg at 04/04/23 1822   oxyCODONE-acetaminophen (PERCOCET) 7.5-325 MG per tablet 1-2 tablet, 1-2 tablet, Oral, Q6H PRN, Verdene Lennert, MD, 1 tablet at 04/07/23 0815   pantoprazole (PROTONIX) EC tablet 40 mg, 40 mg, Oral, QAC breakfast, Verdene Lennert, MD, 40 mg at 04/07/23 0814   pravastatin (PRAVACHOL) tablet 20 mg, 20 mg, Oral, q1800, Verdene Lennert, MD, 20 mg at  04/06/23 1722   roflumilast (DALIRESP) tablet 500 mcg, 500 mcg, Oral, Daily, Ned Clines E, MD, 500 mcg at 04/06/23 1008   sacubitril-valsartan (ENTRESTO) 24-26 mg per tablet, 1 tablet, Oral, BID, Alford Highland, MD, 1 tablet at 04/06/23 2048   sodium chloride flush (NS) 0.9 % injection 3 mL, 3 mL, Intravenous, Q12H, Verdene Lennert, MD, 3 mL at 04/06/23 2048   tamsulosin (FLOMAX) capsule 0.4 mg, 0.4 mg, Oral, QPC breakfast, Verdene Lennert, MD, 0.4 mg at 04/07/23 4332   warfarin (COUMADIN) tablet 4 mg, 4 mg, Oral, ONCE-1600, Marrion Coy, MD   Warfarin - Pharmacist Dosing Inpatient, , Does not apply, q1600, Orson Aloe, RPH    ALLERGIES   Apixaban and Rivaroxaban     REVIEW OF SYSTEMS    Review of Systems:  Gen:  Denies  fever, sweats, chills weigh loss  HEENT: Denies blurred vision, double vision, ear pain, eye pain, hearing loss, nose bleeds, sore throat Cardiac:  No dizziness, chest pain or heaviness, chest tightness,edema Resp:   less  cough + shortness of breath,+ mild wheezing, no  hemoptysis,  Gi: Denies swallowing difficulty, stomach pain, nausea or vomiting, diarrhea, constipation, bowel incontinence Gu:  Denies bladder incontinence, burning urine Ext:   Denies Joint pain, stiffness or swelling Skin: Denies  skin rash, easy bruising or bleeding or hives Endoc:  Denies polyuria, polydipsia , polyphagia or weight change Psych:   Denies depression, insomnia or hallucinations   Other:  All other systems negative   VS: BP 133/75 (BP Location: Left Arm)   Pulse 74   Temp (!) 97.5 F (36.4 C)   Resp 18   Ht 6\' 1"  (1.854 m)   Wt 73.5 kg   SpO2 100%   BMI 21.37 kg/m      PHYSICAL EXAM    GENERAL:NAD, no fevers, chills, no weakness no fatigue HEAD: Normocephalic, atraumatic.  EYES: Pupils equal, round, reactive to light. Extraocular muscles intact. No scleral icterus.  MOUTH: Moist mucosal membrane. Dentition intact. No abscess noted.  EAR, NOSE,  THROAT: Clear without exudates. No external lesions.  NECK: Supple. No thyromegaly. No nodules. No JVD.  PULMONARY: Diffuse coarse rhonchi right sided +wheezes CARDIOVASCULAR: S1 and S2. Regular  rate and rhythm. No murmurs, rubs, or gallops. No edema. Pedal pulses 2+ bilaterally.  GASTROINTESTINAL: Soft, nontender, nondistended. No masses. Positive bowel sounds. No hepatosplenomegaly.  MUSCULOSKELETAL: No swelling, clubbing, or edema. Range of motion full in all extremities.  NEUROLOGIC: Cranial nerves II through XII are intact. No gross focal neurological deficits. Sensation intact. Reflexes intact.  SKIN: No ulceration, lesions, rashes, or cyanosis. Skin warm and dry. Turgor intact.  PSYCHIATRIC: Mood, affect within normal limits. The patient is awake, alert and oriented x 3. Insight, judgment intact.       IMAGING    DG Chest Port 1 View  Result Date: 04/06/2023 CLINICAL DATA:  Pulmonary edema. Chronic hypoxia with respiratory failure. EXAM: PORTABLE CHEST 1 VIEW COMPARISON:  04/02/2023 FINDINGS: Chronic cardiomegaly. Pacemaker/AICD remains in place. Aortic atherosclerosis and tortuosity. Chronic lung disease with emphysema and pulmonary scarring. Chronic calcified granuloma in the right upper lobe. No evidence of active infiltrate, collapse or effusion. IMPRESSION: Chronic cardiomegaly. Pacemaker/AICD. Chronic lung disease with emphysema and pulmonary scarring. No active disease. Electronically Signed   By: Paulina Fusi M.D.   On: 04/06/2023 17:11   DG Chest Port 1 View  Result Date: 04/02/2023 CLINICAL DATA:  Shortness of breath. EXAM: PORTABLE CHEST 1 VIEW COMPARISON:  March 10, 2022. FINDINGS: Stable cardiomediastinal silhouette. Left-sided defibrillator is unchanged in position. Emphysematous disease is noted in the upper lobes. No acute pulmonary disease. Bony thorax is unremarkable. IMPRESSION: No active disease. Emphysema (ICD10-J43.9). Electronically Signed   By: Lupita Raider  M.D.   On: 04/02/2023 15:15      ASSESSMENT/PLAN   This is a pleasant gentleman, presents with dyspnea, covid positive, wheezing, and still relatively tight. C/w copd exacerbation. He was on bipap most of the day ( off now). Not as good as last pm  Does not appear to be in failure. 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 He is aware he still at risk of being intubated.    COVID19     Dexamethasone 10 bid      Pcp ppx    Metaneb TID      Copd exacerbation, still very tight, but no use of accessory muscles, good color and speaking full sentences -duo neb/ pulmicort/add daliresp, morphine is reasonable to try to suppress the sensation  of dyspnea -solumedrol 80 mg iv bid -maintain sats at 92 % al least  -bipap as indicated   Sleep apnea -bip[ap at night on present settings   Hx of chf, not in faliure at present. Afib ( rate controlled) -will continue with out patient cardiology recs ( entresto, toprol, asa,warfarin)        Thank you for allowing me to participate in the care of this patient.   Patient/Family are satisfied with care plan and all questions have been answered.  This document was prepared using Dragon voice recognition software and may include unintentional dictation errors.    Vida Rigger, M.D.  Pulmonary & Critical Care Medicine  [facility]

## 2023-04-07 NOTE — Progress Notes (Addendum)
Progress Note   Patient: Tyler [name] ZOX:[id number] DOB: 1956/04/19 DOA: 04/02/2023     5 DOS: the patient was seen and examined on 04/07/2023   Brief hospital course: 67 year old man with past medical history of chronic respiratory failure on 3 L oxygen, COPD, bronchiectasis, atrial fibrillation on Coumadin, sleep apnea on BiPAP at night, heart failure with reduced ejection fraction, AICD, CAD, hypertension, factor V Leiden mutation, hyperlipidemia, bilateral lower extremity amputations.  He presents to the hospital with respiratory distress coughing up green sputum he was found to be COVID-positive.  11/9.  Patient was tried off the BiPAP a couple times and asked to go back on the BiPAP.   11/10.  Patient received baricitinib and increased dose of Solu-Medrol to 80 mg daily. 11/12.  Patient with more trouble breathing today than yesterday.  Blood pressure better today can give usual Toprol and Lasix dose.  Restart Entresto this evening. 11/13.  Off of BiPAP during daytime.  Still have a large amount of mucus production.   Principal Problem:   Acute on chronic hypoxic respiratory failure (HCC) Active Problems:   COVID-19 virus infection   COPD with acute exacerbation (HCC)   Chronic systolic CHF (congestive heart failure), NYHA class 3 (HCC)   AF (paroxysmal atrial fibrillation) (HCC)   Coronary artery disease   Sleep apnea   Essential hypertension, benign   BPH (benign prostatic hyperplasia)   Assessment and Plan:  * Acute on chronic hypoxic respiratory failure (HCC) COVID-19 virus infection COPD with acute exacerbation [facility]) Patient admitted with respiratory distress and placed on BiPAP.  Patient was on 3 L oxygen at daytime at home, trilogy at night due to chronic respiratory failure. Worsening oxygenation mainly due to COPD exacerbation.  I personally reviewed patient chest x-ray image, no significant infiltration. No evidence of bacterial pneumonia or volume  overload. Appreciate pulmonology consult.  Currently patient is on IV steroids.  He still has large amount of thick mucus, this Flomax.  Will continue scheduled bronchodilator, will also add Mucomyst.   Chronic systolic CHF (congestive heart failure), NYHA class 3 (HCC) Patient has a history of HFrEF with a EF as low as 30% with improvement to 35-40% on most recent echo.  No evidence of volume overload.  Restarted Entresto.  AF (paroxysmal atrial fibrillation) (HCC) Pharmacy to dose Coumadin, Toprol for rate control.  Coronary artery disease Continue Toprol and aspirin.  Sleep apnea BiPAP at bedtime  Essential hypertension, benign Continue Toprol and Lasix.  BPH (benign prostatic hyperplasia) On Flomax      Subjective:  Patient feels better today, still has large amount of thick mucus.  Some wheezing.  But short of breath seem to be improving.  Was off BiPAP the whole day.  Physical Exam: Vitals:   04/06/23 1959 04/06/23 2118 04/07/23 0808 04/07/23 1228  BP: 113/71  133/75 121/63  Pulse: 73  74 76  Resp: 20  18 18   Temp: (!) 97.5 F (36.4 C)  (!) 97.5 F (36.4 C) 98 F (36.7 C)  TempSrc:      SpO2: 100% 100% 100% 100%  Weight:      Height:       General exam: Appears calm and comfortable  Respiratory system: Decreased breathing sounds with wheezes. Respiratory effort normal. Cardiovascular system: S1 & S2 heard, RRR. No JVD, murmurs, rubs, gallops or clicks. No pedal edema. Gastrointestinal system: Abdomen is nondistended, soft and nontender. No organomegaly or masses felt. Normal bowel sounds heard. Central nervous system: Alert and  oriented. No focal neurological deficits. Extremities: right AKA, left BKA Skin: No rashes, lesions or ulcers Psychiatry: Judgement and insight appear normal. Mood & affect appropriate.    Data Reviewed:  Reviewed chest x-ray, lab results.  Family Communication: Wife updated at bedside.  Disposition: Status is:  Inpatient Remains inpatient appropriate because: Severity of disease, IV treatment.     Time spent: 50 minutes  Author: Marrion Coy, MD 04/07/2023 2:01 PM  For on call review [email or web address].

## 2023-04-07 NOTE — Progress Notes (Signed)
PT Cancellation Note  Patient Details Name: Tyler [name] MRN: [id number] DOB: 1955-12-23   Cancelled Treatment:    Reason Eval/Treat Not Completed: Fatigue/lethargy limiting ability to participate;Patient declined, no reason specified. Pt received on BSC and declined PT session. Pt reports that he is too SOB and therefore does not want to participate in additional mobility. Pt notified that this will be the last PT session attempt, however if anything changes to communicate with care team so that new PT orders can be placed. Pt expressed understanding and agreed to plan. Pt will no longer received skilled PT sessions at this time, D/C in house.   Dashiell Franchino Sauvignon Howard SPT, LAT, ATC   Jasilyn Holderman Sauvignon-Howard 04/07/2023, 10:32 AM

## 2023-04-07 NOTE — Consult Note (Signed)
Pharmacy Consult Note - Anticoagulation  Pharmacy Consult for warfarin Indication: atrial fibrillation  PATIENT MEASUREMENTS: Height: 6\' 1"  (185.4 cm) Weight: 73.5 kg (162 lb) IBW/kg (Calculated) : 79.9 HEPARIN DW (KG): 73.5  VITAL SIGNS: Temp: 97.5 F (36.4 C) (11/12 1959) BP: 113/71 (11/12 1959) Pulse Rate: 73 (11/12 1959)  Recent Labs    04/07/23 0323  HGB 12.0*  HCT 36.3*  PLT 242  LABPROT 24.3*  INR 2.2*  CREATININE 0.66    Estimated Creatinine Clearance: 93.2 mL/min (by C-G formula based on SCr of 0.66 mg/dL).  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   Atrial fibrillation (HCC)    Cervical spinal stenosis    with neuropathy   CHF (congestive heart failure) (HCC)    Constipation    COPD (chronic obstructive pulmonary disease) (HCC)    COPD with acute exacerbation (HCC) 10/13/2016   Coronary artery disease    Depression    Dyspnea    Dysrhythmia    atrial fibrillation   Emphysema of lung (HCC)    Factor 5 Leiden mutation, heterozygous (HCC)    on coumadin   Factor V Leiden mutation (HCC)    GERD (gastroesophageal reflux disease)    Hypertension    Lung nodule seen on imaging study    being followed by dr. Belia Heman. just watching it for last few years, without change   Mitral valve insufficiency    Moderate tricuspid insufficiency    Myocardial infarction [facility]) 2004   stent placed, pacemaker implanted 2005   Osteomyelitis (HCC) 10/2019   left foot   Oxygen dependent    requires 2L nasal prong oxygen 24 hours a day   Peripheral vascular disease (HCC)    Presence of permanent cardiac pacemaker [phone]   PVD (peripheral vascular disease) (HCC)    Sleep apnea    waiting to have sleep study. Used bipap while  hospitalized and said it was great for him.   Baseline anticoagulation labs: Recent Labs    04/05/23 0642 04/06/23 0533 04/07/23 0323  INR 3.2* 2.6* 2.2*  HGB  --   --  12.0*  PLT  --   --  242   ASSESSMENT: 67 y.o. male with PMH Afib, factor V Leiden mutation, COPD is presenting with chest pain and cough. Patient is on chronic anticoagulation with warfarin for Afib. HR currently controlled and CHADSVASc is at least 4 (age, CHF, HTN, CAD). Pharmacy has been consulted to initiate and manage heparin intravenous infusion.  Warfarin PTA dose 4 mg daily Total weekly warfarin: 28 mg Last dose on 11/7 TTR : unknown  Interacting medications: Azithromycin x1 11/8, methylprednisolone(steroids)  Nov/13: Patient remains within therapeutic range and trending downwards with stable Hgn and PLT since admission. DDI with azithromycin and steroid (solumedrol > decadron) now stable on day 5. Patient has history of factor V Leiden mutation that puts them at risk for clotting in addition to A.Fib (CHADS-Vasc4) and will try to avoid SUB-therapeutic INR to avoid need to bridge with LWMH/heparin.   Goal(s) of therapy: INR 2 - 3 Monitor platelets by anticoagulation protocol: Yes   DATE INR WARFARIN DOSE COMMENTS  11/8 2.4 4mg    11/9 2.6 4mg    11/10 3.9 HELD SUPRA-therapeutic  11/11 3.2 2 mg  11/12 2.6 2 mg Therapeutic   11/13 2.2  Therapeutic        PLAN: Warfarin 4 mg x 1 today at 1600. Continue daily INR  Check CBC at least every 7 days for inpatients on warfarin  Letticia Bhattacharyya Rodriguez-Guzman PharmD, BCPS 04/07/2023 8:00 AM

## 2023-04-07 NOTE — Consult Note (Signed)
Consultation Note Date: 04/07/2023   Patient Name: Tyler [name]  DOB: 1956/05/10  MRN: [id number]  Age / Sex: 67 y.o., male  PCP: Gracelyn Nurse, MD Referring Physician: Marrion Coy, MD  Reason for Consultation: Establishing goals of care  HPI/Patient Profile: 67 year old man with past medical history of chronic respiratory failure on 3 L oxygen, COPD, bronchiectasis, atrial fibrillation on Coumadin, sleep apnea on BiPAP at night, heart failure with reduced ejection fraction, AICD, CAD, hypertension, factor V Leiden mutation, hyperlipidemia, bilateral lower extremity amputations. He presents to the hospital with respiratory distress coughing up green sputum he was found to be COVID-positive.   Clinical Assessment and Goals of Care:  Notes and reviewed.  In to see patient.  He is currently sitting in bed with his wife at bedside.  He states he has 2 children from a previous marriage, 1 which is estranged.  Wife has children from a previous marriage.   They state they have follow-up one of his aerosolized medications from home, but will need to attempt to bring the machine to want to use the medication.  Patient states at baseline he is on 3 L of oxygen.  States he has had COPD for many years.  He states over the past 3 years his quality of life has significantly declined.  He states he is short of breath with activity, and also if he talks too much.  He states he only leaves the home to go to doctor's appointments.  We discussed his diagnosis, prognosis, GOC, EOL wishes disposition and options.  Created space and opportunity for patient  to explore thoughts and feelings regarding current medical information.   A detailed discussion was had today regarding advanced directives.  Concepts specific to code status, artifical feeding and hydration, IV antibiotics and rehospitalization were discussed.  The  difference between an aggressive medical intervention path and a comfort care path was discussed.  Values and goals of care important to patient and family were attempted to be elicited.  Discussed limitations of medical interventions to prolong quality of life in some situations and discussed the concept of human mortality.  Patient advises that at one point he had a DNR, but reverted back to full code/full scope.  He states he is considering switching to a DNR status.  He states he is also considering a DNI status as he does not see the benefit in his case for ventilator support.  He states he would never want a feeding tube. He states providers have spoken regarding hospice level care.  He states he was under palliative care for a while previously.  With further conversation patient states he needs to consider his wishes further before making formal changes.  He confirms he would want his wife to be his surrogate decision maker.  Discussed that I would follow back up with him tomorrow.  SUMMARY OF RECOMMENDATIONS   Patient considering a DNR/DNI status but states he wants to think about this further prior to making formal changes to CODE STATUS.  Prognosis:  Poor overall      Primary Diagnoses: Present on Admission:  Chronic systolic CHF (congestive heart failure), NYHA class 3 (HCC)  Sleep apnea  Essential hypertension, benign  Coronary artery disease  COPD with acute exacerbation (HCC)  AF (paroxysmal atrial fibrillation) (HCC)  Acute on chronic hypoxic respiratory failure (HCC)  BPH (benign prostatic hyperplasia)   I have reviewed the medical record, interviewed the patient and family, and examined the patient. The following aspects are pertinent.  Past Medical History:  Diagnosis Date   AICD (automatic cardioverter/defibrillator) present    Anxiety    Arthritis    Atherosclerosis of artery of extremity with ulceration (HCC) 09/2019   left foot s/p toe amp requiring  debridement and futher toe amputations.   Atrial fibrillation (HCC)    Cervical spinal stenosis    with neuropathy   CHF (congestive heart failure) (HCC)    Constipation    COPD (chronic obstructive pulmonary disease) (HCC)    COPD with acute exacerbation (HCC) 10/13/2016   Coronary artery disease    Depression    Dyspnea    Dysrhythmia    atrial fibrillation   Emphysema of lung (HCC)    Factor 5 Leiden mutation, heterozygous (HCC)    on coumadin   Factor V Leiden mutation (HCC)    GERD (gastroesophageal reflux disease)    Hypertension    Lung nodule seen on imaging study    being followed by dr. Belia Heman. just watching it for last few years, without change   Mitral valve insufficiency    Moderate tricuspid insufficiency    Myocardial infarction [facility]) 2004   stent placed, pacemaker implanted 2005   Osteomyelitis (HCC) 10/2019   left foot   Oxygen dependent    requires 2L nasal prong oxygen 24 hours a day   Peripheral vascular disease (HCC)    Presence of permanent cardiac pacemaker [phone]   PVD (peripheral vascular disease) (HCC)    Sleep apnea    waiting to have sleep study. Used bipap while hospitalized and said it was great for him.   Social History   Socioeconomic History   Marital status: Married    Spouse name: Darel Hong   Number of children: Not on file   Years of education: Not on file   Highest education level: Not on file  Occupational History   Occupation: no employment    Comment: disabled  Tobacco Use   Smoking status: Former    Current packs/day: 0.00    Average packs/day: 1 pack/day for 42.0 years (42.0 ttl pk-yrs)    Types: Cigarettes    Start date: 09/24/1971    Quit date: 09/23/2013    Years since quitting: 9.5   Smokeless tobacco: Never  Vaping Use   Vaping status: Never Used  Substance and Sexual Activity   Alcohol use: No    Comment: quit 6 years ago   Drug use: No   Sexual activity: Yes  Other Topics Concern   Not on file  Social History  Narrative   Patient lives with wife and a dog.   Social Determinants of Health   Financial Resource Strain: Not on file  Food Insecurity: No Food Insecurity (04/03/2023)   Hunger Vital Sign    Worried About Running Out of Food in the Last Year: Never true    Ran Out of Food in the Last Year: Never true  Transportation Needs: No Transportation Needs (04/03/2023)   PRAPARE - Transportation    Lack of  Transportation (Medical): No    Lack of Transportation (Non-Medical): No  Physical Activity: Not on file  Stress: No Stress Concern Present (02/01/2020)   [employer] of Occupational Health - Occupational Stress Questionnaire    Feeling of Stress : Not at all  Social Connections: Not on file   Family History  Problem Relation Age of Onset   Cancer Mother    Cancer Father    Heart disease Father    Scheduled Meds:  acetylcysteine  3 mL Nebulization BID   aspirin EC  81 mg Oral QPC supper   azithromycin  500 mg Oral Daily   citalopram  20 mg Oral Daily   dexamethasone (DECADRON) injection  8 mg Intravenous Q12H   [START ON 04/08/2023] Ensifentrine  1 ampule Inhalation BID   furosemide  20 mg Oral Daily   gabapentin  300 mg Oral TID   ipratropium-albuterol  3 mL Nebulization Q6H   metoprolol succinate  25 mg Oral Daily   pantoprazole  40 mg Oral QAC breakfast   pravastatin  20 mg Oral q1800   sacubitril-valsartan  1 tablet Oral BID   sodium chloride flush  3 mL Intravenous Q12H   tamsulosin  0.4 mg Oral QPC breakfast   warfarin  4 mg Oral ONCE-1600   Warfarin - Pharmacist Dosing Inpatient   Does not apply q1600   Continuous Infusions: PRN Meds:.ALPRAZolam, docusate sodium, morphine injection, oxyCODONE-acetaminophen Medications Prior to Admission:  Prior to Admission medications   Medication Sig Start Date End Date Taking? Authorizing Provider  acetaminophen (TYLENOL) 500 MG tablet Take 1,000 mg by mouth daily as needed for moderate pain or headache.   Yes [provider]  ALPRAZolam Prudy Feeler) 1 MG tablet Take 1 mg by mouth 2 (two) times daily as needed for anxiety or sleep.    Yes [provider]  aspirin EC 81 MG tablet Take 1 tablet (81 mg total) by mouth daily. Patient taking differently: Take 81 mg by mouth daily after supper. 12/09/15  Yes [name], Marlow Baars, MD  budesonide (PULMICORT) 0.25 MG/2ML nebulizer solution Take 2 mLs by nebulization 2 (two) times daily.   Yes [provider]  budesonide (PULMICORT) 0.5 MG/2ML nebulizer solution Take 2 mLs (0.5 mg total) by nebulization 2 (two) times daily. 03/13/22  Yes Rai, Ripudeep K, MD  citalopram (CELEXA) 20 MG tablet Take 20 mg by mouth daily. 02/25/22  Yes [provider]  dexamethasone (DECADRON) 1 MG tablet Take 3 mg by mouth every morning. 03/19/23  Yes [provider]  docusate sodium (COLACE) 100 MG capsule Take 100 mg by mouth daily as needed (for constipation.).    Yes [provider]  ENTRESTO 24-26 MG Take 1 tablet by mouth 2 (two) times daily. 11/03/19  Yes Hackney, Inetta Fermo A, FNP  EPINEPHrine 0.3 mg/0.3 mL IJ SOAJ injection Inject into the muscle. 05/28/20  Yes [provider]  furosemide (LASIX) 20 MG tablet Take 1 tablet (20 mg total) by mouth daily. 03/13/22  Yes Rai, Ripudeep K, MD  gabapentin (NEURONTIN) 100 MG capsule Take 300 mg by mouth in the morning, at noon, and at bedtime. 09/20/19  Yes [provider]  guaiFENesin (MUCINEX) 600 MG 12 hr tablet Take 1 tablet (600 mg total) by mouth every evening. 03/13/22  Yes Rai, Ripudeep K, MD  ipratropium (ATROVENT) 0.03 % nasal spray Place 2 sprays into both nostrils 2 (two) times daily.    Yes [provider]  Ipratropium-Albuterol (COMBIVENT) 20-100 MCG/ACT AERS  respimat Inhale 1 puff into the lungs every 4 (four) hours.   Yes [provider]  ipratropium-albuterol (DUONEB) 0.5-2.5 (3) MG/3ML SOLN Take 3 mLs by nebulization 4 (four) times daily. 03/13/22  Yes Rai,  Ripudeep K, MD  lovastatin (MEVACOR) 20 MG tablet Take 1 tablet by mouth daily. 09/11/20  Yes [provider]  metoprolol succinate (TOPROL-XL) 25 MG 24 hr tablet Take 25 mg by mouth daily.   Yes [provider]  naloxone [facility]) nasal spray 4 mg/0.1 mL SMARTSIG:Both Nares 12/10/21  Yes [provider]  nitroGLYCERIN (NITROSTAT) 0.4 MG SL tablet Place 0.4 mg under the tongue every 5 (five) minutes as needed for chest pain.   Yes [provider]  oxyCODONE-acetaminophen (PERCOCET) 7.5-325 MG tablet Take 1-2 tablets by mouth every 6 (six) hours as needed for moderate pain. 11/30/19  Yes Stegmayer, Ranae Plumber, PA-C  pantoprazole (PROTONIX) 40 MG tablet Take 40 mg by mouth daily before breakfast.    Yes [provider]  tamsulosin (FLOMAX) 0.4 MG CAPS capsule Take 0.4 mg by mouth daily. 03/04/23  Yes [provider]  TRELEGY ELLIPTA 100-62.5-25 MCG/ACT AEPB Inhale 1 puff into the lungs daily.   Yes [provider]  vitamin B-12 (CYANOCOBALAMIN) 1000 MCG tablet Take 1,000 mcg by mouth daily.   Yes [provider]  warfarin (COUMADIN) 4 MG tablet Take 4 mg by mouth daily. 12/23/21  Yes [provider]  Dupilumab (DUPIXENT) 300 MG/2ML SOPN Inject 300 mg into the skin every 14 (fourteen) days. Inject 2 mLs (300mg ). Patient not taking: Reported on 04/02/2023    [provider]  fluticasone-salmeterol (ADVAIR HFA) 115-21 MCG/ACT inhaler USE 2 INHALATIONS ORALLY EVERY 12 HOURS Patient not taking: Reported on 04/02/2023 05/30/18   Erin Fulling, MD  JARDIANCE 10 MG TABS tablet Take 10 mg by mouth daily with breakfast. Patient not taking: Reported on 04/02/2023 04/10/20   [provider]  morphine (MS CONTIN) 15 MG 12 hr tablet Take 15 mg by mouth daily as needed. Patient not taking: Reported on 04/02/2023 01/23/22   [provider]  Respiratory Therapy Supplies (FLUTTER) DEVI Use as directed. 05/15/19   Bevelyn Ngo, NP  sildenafil (VIAGRA) 25 MG tablet Take by mouth. 12/20/19 01/19/20  [provider]   Allergies  Allergen Reactions   Apixaban Rash   Rivaroxaban Rash   Review of Systems  Respiratory:  Positive for shortness of breath.     Physical Exam Pulmonary:     Effort: Pulmonary effort is normal.  Neurological:     Mental Status: He is alert.     Vital Signs: BP 121/63 (BP Location: Right Arm)   Pulse 76   Temp 98 F (36.7 C)   Resp 18   Ht 6\' 1"  (1.854 m)   Wt 73.5 kg   SpO2 100%   BMI 21.37 kg/m  Pain Scale: 0-10 POSS *See Group Information*: 1-Acceptable,Awake and alert Pain Score: 0-No pain   SpO2: SpO2: 100 % O2 Device:SpO2: 100 % O2 Flow Rate: .O2 Flow Rate (L/min): 4 L/min  IO: Intake/output summary:  Intake/Output Summary (Last 24 hours) at 04/07/2023 1545 Last data filed at 04/07/2023 0810 Gross per 24 hour  Intake 240 ml  Output 950 ml  Net -710 ml    LBM: Last BM Date : 04/06/23 Baseline Weight: Weight: 73.5 kg Most recent weight: Weight: 73.5 kg       Signed by: Morton Stall, NP   Please contact Palliative Medicine  Team phone at [phone] for questions and concerns.  For individual provider: See Loretha Stapler

## 2023-04-08 DIAGNOSIS — J9621 Acute and chronic respiratory failure with hypoxia: Secondary | ICD-10-CM | POA: Diagnosis not present

## 2023-04-08 DIAGNOSIS — U071 COVID-19: Secondary | ICD-10-CM | POA: Diagnosis not present

## 2023-04-08 DIAGNOSIS — Z7189 Other specified counseling: Secondary | ICD-10-CM | POA: Diagnosis not present

## 2023-04-08 DIAGNOSIS — J441 Chronic obstructive pulmonary disease with (acute) exacerbation: Secondary | ICD-10-CM | POA: Diagnosis not present

## 2023-04-08 LAB — PROTIME-INR
INR: 2.5 — ABNORMAL HIGH (ref 0.8–1.2)
Prothrombin Time: 27.3 s — ABNORMAL HIGH (ref 11.4–15.2)

## 2023-04-08 LAB — FERRITIN: Ferritin: 139 ng/mL (ref 24–336)

## 2023-04-08 MED ORDER — WARFARIN SODIUM 4 MG PO TABS
4.0000 mg | ORAL_TABLET | Freq: Once | ORAL | Status: AC
  Administered 2023-04-08: 4 mg via ORAL
  Filled 2023-04-08: qty 1

## 2023-04-08 NOTE — Progress Notes (Addendum)
Daily Progress Note   Patient Name: Tyler [name]       Date: 04/08/2023 DOB: 04/11/1956  Age: 67 y.o. MRN#: [id number] Attending Physician: Marrion Coy, MD Primary Care Physician: Gracelyn Nurse, MD Admit Date: 04/02/2023  Reason for Consultation/Follow-up: Establishing goals of care  Subjective: Notes and labs reviewed.  Into see patient.  He is currently sitting in bed, no family at bedside.  He initially states he has spoken with his wife, and at this time would like to continue full code/full scope status.  He states his wife can make the decision on withdrawing care if needed.  Questions answered regarding hospice and comfort care.  He states he understands his poor prognosis.  With further in-depth conversation, he states he would like to speak with his family further about a DNR/DNI status, but would like to continue full code/full scope at this time.  He again discusses that he had a DNR status previously before reverting back to full code.   Patient advises that he will let staff know if he wishes to speak with a member of the palliative medicine team prior to discharge.  A blank MOST form was left with him to review with his family.  Length of Stay: 6  Current Medications: Scheduled Meds:   acetylcysteine  3 mL Nebulization BID   aspirin EC  81 mg Oral QPC supper   azithromycin  500 mg Oral Daily   citalopram  20 mg Oral Daily   dexamethasone (DECADRON) injection  8 mg Intravenous Q12H   Ensifentrine  1 ampule Inhalation BID   furosemide  20 mg Oral Daily   gabapentin  300 mg Oral TID   ipratropium-albuterol  3 mL Nebulization Q6H   metoprolol succinate  25 mg Oral Daily   pantoprazole  40 mg Oral QAC breakfast   pravastatin  20 mg Oral q1800   sacubitril-valsartan  1  tablet Oral BID   sodium chloride flush  3 mL Intravenous Q12H   tamsulosin  0.4 mg Oral QPC breakfast   warfarin  4 mg Oral ONCE-1600   Warfarin - Pharmacist Dosing Inpatient   Does not apply q1600    Continuous Infusions:   PRN Meds: ALPRAZolam, docusate sodium, ipratropium-albuterol, morphine injection, oxyCODONE-acetaminophen  Physical Exam Pulmonary:     Effort: Pulmonary effort is  normal.  Neurological:     Mental Status: He is alert.             Vital Signs: BP (!) 97/57 (BP Location: Right Arm)   Pulse 75   Temp 98 F (36.7 C) (Oral)   Resp 20   Ht 6\' 1"  (1.854 m)   Wt 73.5 kg   SpO2 100%   BMI 21.37 kg/m  SpO2: SpO2: 100 % O2 Device: O2 Device: Nasal Cannula O2 Flow Rate: O2 Flow Rate (L/min): 4 L/min  Intake/output summary:  Intake/Output Summary (Last 24 hours) at 04/08/2023 1453 Last data filed at 04/07/2023 2133 Gross per 24 hour  Intake --  Output 100 ml  Net -100 ml   LBM: Last BM Date : 04/08/23 Baseline Weight: Weight: 73.5 kg Most recent weight: Weight: 73.5 kg         Patient Active Problem List   Diagnosis Date Noted   COVID-19 virus infection 04/03/2023   Sleep apnea 03/10/2022   SOB (shortness of breath) 03/10/2022   Hx of BKA, left (HCC) 01/23/2020   Atherosclerosis of artery of extremity with gangrene (HCC) 11/22/2019   Chronic respiratory failure with hypoxia (HCC) 11/02/2019   BPH (benign prostatic hyperplasia) 10/25/2019   Depression with anxiety 10/25/2019   Osteomyelitis of left leg (HCC) 10/18/2019   Leg swelling 03/26/2019   Palliative care by specialist 02/16/2019   Atherosclerosis of native arteries of the extremities with ulceration (HCC) 01/31/2019   Postoperative seroma of subcutaneous tissue after non-dermatologic procedure 02/04/2018   Current mild episode of major depressive disorder without prior episode (HCC) 12/14/2017   Atherosclerosis of artery of extremity with rest pain (HCC) 08/11/2017   COPD with acute  exacerbation (HCC) 06/22/2017   Cervical disc disease with myelopathy 05/28/2017   Acute on chronic hypoxic respiratory failure (HCC) 10/13/2016   AF (paroxysmal atrial fibrillation) (HCC) 10/13/2016   Hx of AKA (above knee amputation), right (HCC) 05/15/2016   Essential hypertension, benign 05/15/2016   PVD (peripheral vascular disease) (HCC) 05/15/2016   Chronic systolic CHF (congestive heart failure), NYHA class 3 (HCC) 08/01/2014   Atherosclerotic peripheral vascular disease with intermittent claudication (HCC) 08/01/2014   Moderate tricuspid insufficiency 08/01/2014   Mixed hyperlipidemia 07/23/2014   AICD (automatic cardioverter/defibrillator) present 06/12/2011   Factor V Leiden mutation (HCC) 06/12/2011   Coronary artery disease 06/12/2011    Palliative Care Assessment & Plan    Recommendations/Plan: Patient wishes to remain a full code/full scope at this time.  He states he will continue to consider his wishes on care moving forward. Patient advises that if he decides he would like to speak with a member of our team prior to discharge, he will have staff to reach out to [place].  Blank MOST form left with patient to read. PMT will sign off, please let us know if patient requests to speak with a PMT team.   Code Status:    Code Status Orders  (From admission, onward)           Start     Ordered   04/02/23 1502  Full code  Continuous       Question:  By:  Answer:  Consent: discussion documented in EHR   04/02/23 1505           Code Status History     Date Active Date Inactive Code Status Order ID Comments User Context   03/10/2022 2254 03/13/2022 1645 Full Code [id number]  Gertha Calkin, MD ED  11/22/2019 1505 11/30/2019 1851 Full Code [id number]  Annice Needy, MD Inpatient   10/25/2019 1605 10/29/2019 2129 Full Code [id number]  Lorretta Harp, MD ED   10/18/2019 0154 10/24/2019 2157 Full Code [id number]  Mansy, Vernetta Honey, MD ED   10/06/2019 1154 10/06/2019 1923 Full Code [id number]   Gwyneth Revels, DPM Inpatient   08/04/2019 0817 08/04/2019 1447 Full Code [id number]  Gwyneth Revels, DPM Inpatient   08/11/2017 1144 08/13/2017 1851 Full Code [id number]  Annice Needy, MD Inpatient   06/22/2017 1841 06/25/2017 1802 Full Code [id number]  Adrian Saran, MD Inpatient   06/07/2017 0930 06/07/2017 1610 Full Code [id number]  Annice Needy, MD Inpatient   10/14/2016 0010 10/23/2016 1445 Full Code [id number]  Oralia Manis, MD Inpatient       Prognosis: Poor overall   Thank you for allowing the Palliative Medicine Team to assist in the care of this patient.    Morton Stall, NP  Please contact Palliative Medicine Team phone at [phone] for questions and concerns.

## 2023-04-08 NOTE — Progress Notes (Addendum)
Progress Note   Patient: Tyler [name] XBM:[id number] DOB: Feb 14, 1956 DOA: 04/02/2023     6 DOS: the patient was seen and examined on 04/08/2023   Brief hospital course: 67 year old man with past medical history of chronic respiratory failure on 3 L oxygen, COPD, bronchiectasis, atrial fibrillation on Coumadin, sleep apnea on BiPAP at night, heart failure with reduced ejection fraction, AICD, CAD, hypertension, factor V Leiden mutation, hyperlipidemia, bilateral lower extremity amputations.  He presents to the hospital with respiratory distress coughing up green sputum he was found to be COVID-positive.  11/9.  Patient was tried off the BiPAP a couple times and asked to go back on the BiPAP.   11/10.  Patient received baricitinib and increased dose of Solu-Medrol to 80 mg daily. 11/12.  Patient with more trouble breathing today than yesterday.  Blood pressure better today can give usual Toprol and Lasix dose.  Restart Entresto this evening. 11/13.  Off of BiPAP during daytime.  Still have a large amount of mucus production.   Principal Problem:   Acute on chronic hypoxic respiratory failure (HCC) Active Problems:   COVID-19 virus infection   COPD with acute exacerbation (HCC)   Chronic systolic CHF (congestive heart failure), NYHA class 3 (HCC)   AF (paroxysmal atrial fibrillation) (HCC)   Coronary artery disease   Sleep apnea   Essential hypertension, benign   BPH (benign prostatic hyperplasia)   Assessment and Plan: * Acute on chronic hypoxic respiratory failure (HCC) COVID-19 virus infection COPD with acute exacerbation [facility]) Patient admitted with respiratory distress and placed on BiPAP.  Patient was on 3 L oxygen at daytime at home, trilogy at night due to chronic respiratory failure. Worsening oxygenation mainly due to COPD exacerbation.  I personally reviewed patient chest x-ray image, no significant infiltration. No evidence of bacterial pneumonia or volume  overload. Appreciate pulmonology consult.  Currently patient is on IV steroids.  He still has large amount of thick mucus, on Zithromax.  Will continue scheduled bronchodilator, also added Mucomyst. Patient feels better today, still have large amount of mucus production.  Continue current treatment.     Chronic systolic CHF (congestive heart failure), NYHA class 3 (HCC) Patient has a history of HFrEF with a EF as low as 30% with improvement to 35-40% on most recent echo.  No evidence of volume overload.  Restarted Entresto.   AF (paroxysmal atrial fibrillation) (HCC) Pharmacy to dose Coumadin, Toprol for rate control.   Coronary artery disease Continue Toprol and aspirin.   Sleep apnea BiPAP at bedtime   Essential hypertension, benign Continue Toprol and Lasix.   BPH (benign prostatic hyperplasia) On Flomax        Subjective:  Patient still has shortness of breath with minimal exertion which is baseline.  Still has large amount of mucus production.  Physical Exam: Vitals:   04/08/23 0519 04/08/23 0759 04/08/23 0851 04/08/23 1158  BP: (!) 103/54  (!) 150/78 (!) 97/57  Pulse: 76   75  Resp: 20  20   Temp: 97.9 F (36.6 C)  98 F (36.7 C)   TempSrc:   Oral   SpO2: 94% 100% 100% 100%  Weight:      Height:       General exam: Appears calm and comfortable  Respiratory system: Significant decreased breathing sounds with wheezes. Respiratory effort normal. Cardiovascular system: S1 & S2 heard, RRR. No JVD, murmurs, rubs, gallops or clicks. No pedal edema. Gastrointestinal system: Abdomen is nondistended, soft and nontender. No organomegaly or  masses felt. Normal bowel sounds heard. Central nervous system: Alert and oriented. No focal neurological deficits. Extremities: Right AKA, left BKA Skin: No rashes, lesions or ulcers Psychiatry: Judgement and insight appear normal. Mood & affect appropriate.    Data Reviewed:  Lab results reviewed.  Family Communication:  None  Disposition: Status is: Inpatient Remains inpatient appropriate because: Severity of disease.     Time spent: 35 minutes  Author: Marrion Coy, MD 04/08/2023 1:27 PM  For on call review [email or web address].

## 2023-04-08 NOTE — Plan of Care (Signed)
  Problem: Education: Goal: Knowledge of risk factors and measures for prevention of condition will improve Outcome: Progressing   Problem: Respiratory: Goal: Will maintain a patent airway Outcome: Not Progressing Goal: Complications related to the disease process, condition or treatment will be avoided or minimized Outcome: Not Progressing

## 2023-04-08 NOTE — Consult Note (Signed)
Pharmacy Consult Note - Anticoagulation  Pharmacy Consult for warfarin Indication: atrial fibrillation  PATIENT MEASUREMENTS: Height: 6\' 1"  (185.4 cm) Weight: 73.5 kg (162 lb) IBW/kg (Calculated) : 79.9 HEPARIN DW (KG): 73.5  VITAL SIGNS: Temp: 97.9 F (36.6 C) (11/14 0519) Temp Source: Oral (11/13 2005) BP: 103/54 (11/14 0519) Pulse Rate: 76 (11/14 0519)  Recent Labs    04/07/23 0323 04/08/23 0621  HGB 12.0*  --   HCT 36.3*  --   PLT 242  --   LABPROT 24.3* 27.3*  INR 2.2* 2.5*  CREATININE 0.66  --     Estimated Creatinine Clearance: 93.2 mL/min (by C-G formula based on SCr of 0.66 mg/dL).  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   Atrial fibrillation (HCC)    Cervical spinal stenosis    with neuropathy   CHF (congestive heart failure) (HCC)    Constipation    COPD (chronic obstructive pulmonary disease) (HCC)    COPD with acute exacerbation (HCC) 10/13/2016   Coronary artery disease    Depression    Dyspnea    Dysrhythmia    atrial fibrillation   Emphysema of lung (HCC)    Factor 5 Leiden mutation, heterozygous (HCC)    on coumadin   Factor V Leiden mutation (HCC)    GERD (gastroesophageal reflux disease)    Hypertension    Lung nodule seen on imaging study    being followed by dr. Belia Heman. just watching it for last few years, without change   Mitral valve insufficiency    Moderate tricuspid insufficiency    Myocardial infarction [facility]) 2004   stent placed, pacemaker implanted 2005   Osteomyelitis (HCC) 10/2019   left foot   Oxygen dependent    requires 2L nasal prong oxygen 24 hours a day   Peripheral vascular disease (HCC)    Presence of permanent cardiac pacemaker [phone]   PVD (peripheral vascular  disease) (HCC)    Sleep apnea    waiting to have sleep study. Used bipap while hospitalized and said it was great for him.   Baseline anticoagulation labs: Recent Labs    04/06/23 0533 04/07/23 0323 04/08/23 0621  INR 2.6* 2.2* 2.5*  HGB  --  12.0*  --   PLT  --  242  --    ASSESSMENT: 67 y.o. male with PMH Afib, factor V Leiden mutation, COPD is presenting with chest pain and cough. Patient is on chronic anticoagulation with warfarin for Afib. HR currently controlled and CHADSVASc is at least 4 (age, CHF, HTN, CAD). Pharmacy has been consulted to initiate and manage heparin intravenous infusion.  Warfarin PTA dose 4 mg daily Total weekly warfarin: 28 mg Last dose on 11/7 TTR : unknown  Interacting medications:  Azithromycin x1 11/8 Decadron (steroids)  Nov/13: Patient remains within therapeutic range and trending downwards with stable Hgn and PLT since admission. DDI with azithromycin and steroid (solumedrol > decadron) now stable on day 5. Patient has history of factor V Leiden mutation that puts them at risk for clotting in addition to A.Fib (CHADS-Vasc4) and will try to avoid SUB-therapeutic INR to avoid need to bridge with LWMH/heparin.   Goal(s) of therapy: INR 2 - 3 Monitor platelets by anticoagulation protocol: Yes   DATE INR WARFARIN DOSE  COMMENTS  11/8 2.4 4mg    11/9 2.6 4mg    11/10 3.9 HELD SUPRA-therapeutic  11/11 3.2 2 mg    11/12 2.6 2 mg Therapeutic   11/13 2.2 4 mg Therapeutic  11/14 2.5     PLAN: Warfarin 4 mg x 1 today at 1600. Continue daily INR  Check CBC at least every 7 days for inpatients on warfarin (last CBC 11/13)  Effie Shy, PharmD Pharmacy Resident  04/08/2023 7:53 AM

## 2023-04-09 DIAGNOSIS — U071 COVID-19: Secondary | ICD-10-CM | POA: Diagnosis not present

## 2023-04-09 DIAGNOSIS — J9621 Acute and chronic respiratory failure with hypoxia: Secondary | ICD-10-CM | POA: Diagnosis not present

## 2023-04-09 DIAGNOSIS — J441 Chronic obstructive pulmonary disease with (acute) exacerbation: Secondary | ICD-10-CM | POA: Diagnosis not present

## 2023-04-09 LAB — FERRITIN: Ferritin: 154 ng/mL (ref 24–336)

## 2023-04-09 LAB — PROTIME-INR
INR: 2.7 — ABNORMAL HIGH (ref 0.8–1.2)
Prothrombin Time: 29.2 s — ABNORMAL HIGH (ref 11.4–15.2)

## 2023-04-09 MED ORDER — WARFARIN SODIUM 4 MG PO TABS
4.0000 mg | ORAL_TABLET | Freq: Once | ORAL | Status: AC
  Administered 2023-04-09: 4 mg via ORAL
  Filled 2023-04-09: qty 1

## 2023-04-09 MED ORDER — SALINE SPRAY 0.65 % NA SOLN: 1.0000 | NASAL | Status: DC | PRN

## 2023-04-09 NOTE — Plan of Care (Signed)

## 2023-04-09 NOTE — Consult Note (Signed)
Pharmacy Consult Note - Anticoagulation  Pharmacy Consult for warfarin Indication: atrial fibrillation  PATIENT MEASUREMENTS: Height: 6\' 1"  (185.4 cm) Weight: 73.5 kg (162 lb) IBW/kg (Calculated) : 79.9 HEPARIN DW (KG): 73.5  VITAL SIGNS: Temp: 97.9 F (36.6 C) (11/15 0851) Temp Source: Axillary (11/15 0341) BP: 134/61 (11/15 0851) Pulse Rate: 77 (11/15 0851)  Recent Labs    04/07/23 0323 04/08/23 0621 04/09/23 0555  HGB 12.0*  --   --   HCT 36.3*  --   --   PLT 242  --   --   LABPROT 24.3*   < > 29.2*  INR 2.2*   < > 2.7*  CREATININE 0.66  --   --    < > = values in this interval not displayed.    Estimated Creatinine Clearance: 93.2 mL/min (by C-G formula based on SCr of 0.66 mg/dL).  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   Atrial fibrillation (HCC)    Cervical spinal stenosis    with neuropathy   CHF (congestive heart failure) (HCC)    Constipation    COPD (chronic obstructive pulmonary disease) (HCC)    COPD with acute exacerbation (HCC) 10/13/2016   Coronary artery disease    Depression    Dyspnea    Dysrhythmia    atrial fibrillation   Emphysema of lung (HCC)    Factor 5 Leiden mutation, heterozygous (HCC)    on coumadin   Factor V Leiden mutation (HCC)    GERD (gastroesophageal reflux disease)    Hypertension    Lung nodule seen on imaging study    being followed by dr. Belia Heman. just watching it for last few years, without change   Mitral valve insufficiency    Moderate tricuspid insufficiency    Myocardial infarction [facility]) 2004   stent placed, pacemaker implanted 2005   Osteomyelitis (HCC) 10/2019   left foot   Oxygen dependent    requires 2L nasal prong oxygen 24 hours a day   Peripheral vascular  disease (HCC)    Presence of permanent cardiac pacemaker [phone]   PVD (peripheral vascular disease) (HCC)    Sleep apnea    waiting to have sleep study. Used bipap while hospitalized and said it was great for him.   Baseline anticoagulation labs: Recent Labs    04/07/23 0323 04/08/23 0621 04/09/23 0555  INR 2.2* 2.5* 2.7*  HGB 12.0*  --   --   PLT 242  --   --    ASSESSMENT: 67 y.o. male with PMH Afib, factor V Leiden mutation, COPD is presenting with chest pain and cough. Patient is on chronic anticoagulation with warfarin for Afib. HR currently controlled and CHADSVASc is at least 4 (age, CHF, HTN, CAD). Pharmacy has been consulted to initiate and manage heparin intravenous infusion.  Warfarin PTA dose 4 mg daily Total weekly warfarin: 28 mg Last dose on 11/7 TTR : unknown  Interacting medications:  Azithromycin x1 11/8 Decadron (steroids)  Nov/13: Patient remains within therapeutic range and trending downwards with stable Hgn and PLT since admission. DDI with azithromycin and steroid (solumedrol > decadron) now stable on day 5. Patient has history of factor V Leiden mutation that puts them at risk for clotting in addition to A.Fib (CHADS-Vasc4) and will  try to avoid SUB-therapeutic INR to avoid need to bridge with LWMH/heparin.   Goal(s) of therapy: INR 2 - 3 Monitor platelets by anticoagulation protocol: Yes   DATE INR WARFARIN DOSE COMMENTS  11/8 2.4 4mg    11/9 2.6 4mg    11/10 3.9 HELD SUPRA-therapeutic  11/11 3.2 2 mg    11/12 2.6 2 mg Therapeutic   11/13 2.2 4 mg Therapeutic  11/14 2.5 4 mg Therapeutic  11/15 2.7     PLAN: Warfarin 4 mg x 1 today at 1600. Continue daily INR  Check CBC at least every 7 days for inpatients on warfarin (last CBC 11/13)  Effie Shy, PharmD Pharmacy Resident  04/09/2023 9:53 AM

## 2023-04-09 NOTE — Progress Notes (Signed)
Pulmonary Medicine          Date: 04/09/2023,   MRN# [id number] Tyler [name] [birth year]     AdmissionWeight: 73.5 kg                 CurrentWeight: 73.5 kg       SUBJECTIVE    Patient is sitting up in bed, wife at bedside.  He is on 4L/min .  We will obtain CRP trend and ferritin.  Plan to dc pulmicort, switch steroids to dexamethasone 10 bid.  Starting zithromax 500 for copd.    Patient remains on 4L/min with severe dyspnea even at rest.  Today we discussed brining in his Home medication [place] via nebulizer for use while hospitalized.  Additionally will obtain CXR and diurese interstitial edema due to pan isnspiratory and expiratory wheezing.   04/09/23- patient with overall poor prognosis.  S/p palliative care evaluation.  Is not improving much.  We discussed hospice together he is agreeable.  He is now DNR. Very slow to improve.  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   Atrial fibrillation (HCC)    Cervical spinal stenosis    with neuropathy   CHF (congestive heart failure) (HCC)    Constipation    COPD (chronic obstructive pulmonary disease) (HCC)    COPD with acute exacerbation (HCC) 10/13/2016   Coronary artery disease    Depression    Dyspnea    Dysrhythmia    atrial fibrillation   Emphysema of lung (HCC)    Factor 5 Leiden mutation, heterozygous (HCC)    on coumadin   Factor V Leiden mutation (HCC)    GERD (gastroesophageal reflux disease)    Hypertension    Lung nodule seen on imaging study    being followed by dr. Belia Heman. just watching it for last few years, without change   Mitral valve insufficiency    Moderate tricuspid insufficiency     Myocardial infarction [facility]) 2004   stent placed, pacemaker implanted 2005   Osteomyelitis (HCC) 10/2019   left foot   Oxygen dependent    requires 2L nasal prong oxygen 24 hours a day   Peripheral vascular disease (HCC)    Presence of permanent cardiac pacemaker [phone]   PVD (peripheral vascular disease) (HCC)    Sleep apnea    waiting to have sleep study. Used bipap while hospitalized and said it was great for him.     SURGICAL HISTORY   Past Surgical History:  Procedure Laterality Date   ABOVE KNEE LEG AMPUTATION Right    after below the knee amputation    AMPUTATION Left 11/22/2019   Procedure: AMPUTATION BELOW KNEE;  Surgeon: Annice Needy, MD;  Location: [facility] ORS;  Service: Vascular;  Laterality: Left;   AMPUTATION TOE Left 08/04/2019   Procedure: AMPUTATION TOE MPJ LEFT;  Surgeon: Gwyneth Revels, DPM;  Location: [facility];  Service: Podiatry;  Laterality: Left;   AMPUTATION TOE Left 10/06/2019   Procedure: AMPUTATION TOE MPJ T1,T2 LEFT;  Surgeon: Gwyneth Revels, DPM;  Location: [facility];  Service: Podiatry;  Laterality: Left;   APPLICATION OF WOUND VAC Left 01/12/2018   Procedure: APPLICATION OF WOUND VAC;  Surgeon: Wyn Quaker,  Marlow Baars, MD;  Location: [facility];  Service: Vascular;  Laterality: Left;   BELOW KNEE LEG AMPUTATION Right    CATARACT EXTRACTION, BILATERAL Bilateral    COLONOSCOPY N/A 02/19/2020   Procedure: COLONOSCOPY;  Surgeon: Regis Bill, MD;  Location: [facility];  Service: Endoscopy;  Laterality: N/A;   COLONOSCOPY WITH PROPOFOL N/A 08/27/2020   Procedure: COLONOSCOPY WITH PROPOFOL;  Surgeon: Regis Bill, MD;  Location: [facility];  Service: Endoscopy;  Laterality: N/A;  REQ MID AM   CORONARY ANGIOPLASTY  2005   stent x 1 placed   ENDARTERECTOMY FEMORAL Left 08/11/2017   Procedure: ENDARTERECTOMY FEMORAL;  Surgeon: Annice Needy, MD;  Location: [facility];  Service: Vascular;  Laterality: Left;   ESOPHAGOGASTRODUODENOSCOPY N/A 02/19/2020    Procedure: ESOPHAGOGASTRODUODENOSCOPY (EGD);  Surgeon: Regis Bill, MD;  Location: [facility] ENDOSCOPY;  Service: Endoscopy;  Laterality: N/A;   HEMATOMA EVACUATION Left 01/12/2018   Procedure: EVACUATION HEMATOMA ( DRAINING OF SEROMA);  Surgeon: Annice Needy, MD;  Location: [facility];  Service: Vascular;  Laterality: Left;   IMPLANTABLE CARDIOVERTER DEFIBRILLATOR (ICD) GENERATOR CHANGE Left 02/10/2017   Procedure: ICD GENERATOR CHANGE;  Surgeon: Marcina Millard, MD;  Location: [facility];  Service: Cardiovascular;  Laterality: Left;   INSERT / REPLACE / REMOVE PACEMAKER  [id number], 2018   LOWER EXTREMITY ANGIOGRAPHY Left 06/07/2017   Procedure: LOWER EXTREMITY ANGIOGRAPHY;  Surgeon: Annice Needy, MD;  Location: [facility];  Service: Cardiovascular;  Laterality: Left;   LOWER EXTREMITY ANGIOGRAPHY Left 10/05/2019   Procedure: LOWER EXTREMITY ANGIOGRAPHY;  Surgeon: Annice Needy, MD;  Location: [facility];  Service: Cardiovascular;  Laterality: Left;   LOWER EXTREMITY INTERVENTION  06/07/2017   Procedure: LOWER EXTREMITY INTERVENTION;  Surgeon: Annice Needy, MD;  Location: [facility];  Service: Cardiovascular;;   PERIPHERAL VASCULAR CATHETERIZATION Left 12/09/2015   Procedure: Lower Extremity Angiography;  Surgeon: Annice Needy, MD;  Location: [facility];  Service: Cardiovascular;  Laterality: Left;   PERIPHERAL VASCULAR CATHETERIZATION  12/09/2015   Procedure: Lower Extremity Intervention;  Surgeon: Annice Needy, MD;  Location: [facility];  Service: Cardiovascular;;   TONSILLECTOMY     TRANSMETATARSAL AMPUTATION Left 10/20/2019   Procedure: TRANSMETATARSAL AMPUTATION;  Surgeon: Linus Galas, DPM;  Location: [facility];  Service: Podiatry;  Laterality: Left;     FAMILY HISTORY   Family History  Problem Relation Age of Onset   Cancer Mother    Cancer Father    Heart disease Father      SOCIAL HISTORY   Social History   Tobacco Use   Smoking  status: Former    Current packs/day: 0.00    Average packs/day: 1 pack/day for 42.0 years (42.0 ttl pk-yrs)    Types: Cigarettes    Start date: 09/24/1971    Quit date: 09/23/2013    Years since quitting: 9.5   Smokeless tobacco: Never  Vaping Use   Vaping status: Never Used  Substance Use Topics   Alcohol use: No    Comment: quit 6 years ago   Drug use: No     MEDICATIONS    Home Medication:    Current Medication:  Current Facility-Administered Medications:    acetylcysteine (MUCOMYST) 20 % nebulizer / oral solution 3 mL, 3 mL, Nebulization, BID, Chipper Herb, Dekui, MD, 3 mL at 04/07/23 2056   ALPRAZolam (XANAX) tablet 1 mg, 1 mg, Oral, BID PRN, Verdene Lennert, MD, 1 mg at 04/08/23 2254  aspirin EC tablet 81 mg, 81 mg, Oral, QPC supper, Verdene Lennert, MD, 81 mg at 04/08/23 1848   azithromycin [facility]) tablet 500 mg, 500 mg, Oral, Daily, Marrion Coy, MD, 500 mg at 04/08/23 4034   citalopram (CELEXA) tablet 20 mg, 20 mg, Oral, Daily, Verdene Lennert, MD, 20 mg at 04/08/23 7425   dexamethasone (DECADRON) injection 8 mg, 8 mg, Intravenous, Q12H, Vida Rigger, MD, 8 mg at 04/08/23 2237   docusate sodium (COLACE) capsule 100 mg, 100 mg, Oral, Daily PRN, Verdene Lennert, MD   Ensifentrine SUSP 1 ampule, 1 ampule, Inhalation, BID, Vida Rigger, MD   furosemide (LASIX) tablet 20 mg, 20 mg, Oral, Daily, Alford Highland, MD, 20 mg at 04/08/23 9563   gabapentin (NEURONTIN) capsule 300 mg, 300 mg, Oral, TID, Verdene Lennert, MD, 300 mg at 04/08/23 2238   ipratropium-albuterol (DUONEB) 0.5-2.5 (3) MG/3ML nebulizer solution 3 mL, 3 mL, Nebulization, Q6H, Verdene Lennert, MD, 3 mL at 04/09/23 0723   ipratropium-albuterol (DUONEB) 0.5-2.5 (3) MG/3ML nebulizer solution 3 mL, 3 mL, Nebulization, Q4H PRN, Marrion Coy, MD   metoprolol succinate (TOPROL-XL) 24 hr tablet 25 mg, 25 mg, Oral, Daily, Renae Gloss, Richard, MD, 25 mg at 04/08/23 8756   morphine (PF) 2 MG/ML injection 1 mg, 1 mg,  Intravenous, Q4H PRN, Alford Highland, MD, 1 mg at 04/08/23 2253   oxyCODONE-acetaminophen (PERCOCET) 7.5-325 MG per tablet 1-2 tablet, 1-2 tablet, Oral, Q6H PRN, Verdene Lennert, MD, 1 tablet at 04/08/23 1634   pantoprazole (PROTONIX) EC tablet 40 mg, 40 mg, Oral, QAC breakfast, Verdene Lennert, MD, 40 mg at 04/08/23 4332   pravastatin (PRAVACHOL) tablet 20 mg, 20 mg, Oral, q1800, Verdene Lennert, MD, 20 mg at 04/08/23 1848   sacubitril-valsartan (ENTRESTO) 24-26 mg per tablet, 1 tablet, Oral, BID, Alford Highland, MD, 1 tablet at 04/08/23 2238   sodium chloride flush (NS) 0.9 % injection 3 mL, 3 mL, Intravenous, Q12H, Verdene Lennert, MD, 3 mL at 04/08/23 2238   tamsulosin (FLOMAX) capsule 0.4 mg, 0.4 mg, Oral, QPC breakfast, Verdene Lennert, MD, 0.4 mg at 04/08/23 9518   Warfarin - Pharmacist Dosing Inpatient, , Does not apply, q1600, Orson Aloe, [facility], Given at 04/08/23 1627    ALLERGIES   Apixaban and Rivaroxaban     REVIEW OF SYSTEMS    Review of Systems:  Gen:  Denies  fever, sweats, chills weigh loss  HEENT: Denies blurred vision, double vision, ear pain, eye pain, hearing loss, nose bleeds, sore throat Cardiac:  No dizziness, chest pain or heaviness, chest tightness,edema Resp:   less  cough + shortness of breath,+ mild wheezing, no  hemoptysis,  Gi: Denies swallowing difficulty, stomach pain, nausea or vomiting, diarrhea, constipation, bowel incontinence Gu:  Denies bladder incontinence, burning urine Ext:   Denies Joint pain, stiffness or swelling Skin: Denies  skin rash, easy bruising or bleeding or hives Endoc:  Denies polyuria, polydipsia , polyphagia or weight change Psych:   Denies depression, insomnia or hallucinations   Other:  All other systems negative   VS: BP (!) 117/94 (BP Location: Left Arm)   Pulse (!) 103   Temp 98 F (36.7 C) (Axillary)   Resp 20   Ht 6\' 1"  (1.854 m)   Wt 73.5 kg   SpO2 99%   BMI 21.37 kg/m      PHYSICAL EXAM     GENERAL:NAD, no fevers, chills, no weakness no fatigue HEAD: Normocephalic, atraumatic.  EYES: Pupils equal, round, reactive to light. Extraocular muscles intact. No scleral icterus.  MOUTH: Moist mucosal membrane. Dentition intact. No abscess noted.  EAR, NOSE, THROAT: Clear without exudates. No external lesions.  NECK: Supple. No thyromegaly. No nodules. No JVD.  PULMONARY: Diffuse coarse rhonchi right sided +wheezes CARDIOVASCULAR: S1 and S2. Regular rate and rhythm. No murmurs, rubs, or gallops. No edema. Pedal pulses 2+ bilaterally.  GASTROINTESTINAL: Soft, nontender, nondistended. No masses. Positive bowel sounds. No hepatosplenomegaly.  MUSCULOSKELETAL: No swelling, clubbing, or edema. Range of motion full in all extremities.  NEUROLOGIC: Cranial nerves II through XII are intact. No gross focal neurological deficits. Sensation intact. Reflexes intact.  SKIN: No ulceration, lesions, rashes, or cyanosis. Skin warm and dry. Turgor intact.  PSYCHIATRIC: Mood, affect within normal limits. The patient is awake, alert and oriented x 3. Insight, judgment intact.       IMAGING    DG Chest Port 1 View  Result Date: 04/06/2023 CLINICAL DATA:  Pulmonary edema. Chronic hypoxia with respiratory failure. EXAM: PORTABLE CHEST 1 VIEW COMPARISON:  04/02/2023 FINDINGS: Chronic cardiomegaly. Pacemaker/AICD remains in place. Aortic atherosclerosis and tortuosity. Chronic lung disease with emphysema and pulmonary scarring. Chronic calcified granuloma in the right upper lobe. No evidence of active infiltrate, collapse or effusion. IMPRESSION: Chronic cardiomegaly. Pacemaker/AICD. Chronic lung disease with emphysema and pulmonary scarring. No active disease. Electronically Signed   By: Paulina Fusi M.D.   On: 04/06/2023 17:11   DG Chest Port 1 View  Result Date: 04/02/2023 CLINICAL DATA:  Shortness of breath. EXAM: PORTABLE CHEST 1 VIEW COMPARISON:  March 10, 2022. FINDINGS: Stable  cardiomediastinal silhouette. Left-sided defibrillator is unchanged in position. Emphysematous disease is noted in the upper lobes. No acute pulmonary disease. Bony thorax is unremarkable. IMPRESSION: No active disease. Emphysema (ICD10-J43.9). Electronically Signed   By: Lupita Raider M.D.   On: 04/02/2023 15:15      ASSESSMENT/PLAN   This is a pleasant gentleman, presents with dyspnea, covid positive, wheezing, and still relatively tight. C/w copd exacerbation. He was on bipap most of the day ( off now). Not as good as last pm  Does not appear to be in failure. 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 He is aware he still at risk of being intubated.    COVID19     Dexamethasone 10 bid      Pcp ppx    Metaneb TID      Copd exacerbation, still very tight, but no use of accessory muscles, good color and speaking full sentences -duo neb/ pulmicort, -dexamethasone 8 bid -metaneb -PT/OT if able  End stage COPD very poor prognosis   Sleep apnea -bip[ap at night on present settings   Hx of chf, not in faliure at present. Afib ( rate controlled) -will continue with out patient cardiology recs ( entresto, toprol, asa,warfarin)        Thank you for allowing me to participate in the care of this patient.   Patient/Family are satisfied with care plan and all questions have been answered.  This document was prepared using Dragon voice recognition software and may include unintentional dictation errors.    Vida Rigger, M.D.  Pulmonary & Critical Care Medicine  [facility]

## 2023-04-09 NOTE — Progress Notes (Signed)
Progress Note   Patient: Tyler [name]:[id number] DOB: [birth year] DOA: 04/02/2023     7 DOS: the patient was seen and examined on 04/09/2023   Brief hospital course: 67 year old man with past medical history of chronic respiratory failure on 3 L oxygen, COPD, bronchiectasis, atrial fibrillation on Coumadin, sleep apnea on BiPAP at night, heart failure with reduced ejection fraction, AICD, CAD, hypertension, factor V Leiden mutation, hyperlipidemia, bilateral lower extremity amputations.  He presents to the hospital with respiratory distress coughing up green sputum he was found to be COVID-positive.  11/9.  Patient was tried off the BiPAP a couple times and asked to go back on the BiPAP.   11/10.  Patient received baricitinib and increased dose of Solu-Medrol to 80 mg daily. 11/12.  Patient with more trouble breathing today than yesterday.  Blood pressure better today can give usual Toprol and Lasix dose.  Restart Entresto this evening. 11/13.  Off of BiPAP during daytime.  Still have a large amount of mucus production.   Principal Problem:   Acute on chronic hypoxic respiratory failure (HCC) Active Problems:   COVID-19 virus infection   COPD with acute exacerbation (HCC)   Chronic systolic CHF (congestive heart failure), NYHA class 3 (HCC)   AF (paroxysmal atrial fibrillation) (HCC)   Coronary artery disease   Sleep apnea   Essential hypertension, benign   BPH (benign prostatic hyperplasia)   Assessment and Plan:  Acute on chronic hypoxic respiratory failure (HCC) COVID-19 virus infection COPD with acute exacerbation [facility]) Patient admitted with respiratory distress and placed on BiPAP.  Patient was on 3 L oxygen at daytime at home, trilogy at night due to chronic respiratory failure. Worsening oxygenation mainly due to COPD exacerbation.  I personally reviewed patient chest x-ray image, no significant infiltration. No evidence of bacterial pneumonia or volume  overload. Appreciate pulmonology consult.  Currently patient is on IV steroids.  He still has large amount of thick mucus, on Zithromax.  Will continue scheduled bronchodilator, also added Mucomyst. Discussed with pulmonology, patient to COPD appear to be terminal.  Recommended hospice care.  Patient would consider that.  Most likely patient can be discharged home with hospice care.  However, we will continue treating him during the weekend, will make a decision on Monday.     Chronic systolic CHF (congestive heart failure), NYHA class 3 (HCC) Patient has a history of HFrEF with a EF as low as 30% with improvement to 35-40% on most recent echo.  No evidence of volume overload.  Restarted Entresto.   AF (paroxysmal atrial fibrillation) (HCC) Pharmacy to dose Coumadin, Toprol for rate control.   Coronary artery disease Continue Toprol and aspirin.   Sleep apnea BiPAP at bedtime   Essential hypertension, benign Continue Toprol and Lasix.   BPH (benign prostatic hyperplasia) On Flomax            Subjective:  Short of breath with minimal exertion, still has large amount of mucus production, but feels better.  Physical Exam: Vitals:   04/09/23 0341 04/09/23 0727 04/09/23 0851 04/09/23 1212  BP: (!) 117/94  134/61 (!) 106/59  Pulse: (!) 103  77 77  Resp:   20 (!) 25  Temp: 98 F (36.7 C)  97.9 F (36.6 C) 97.6 F (36.4 C)  TempSrc: Axillary   Oral  SpO2: 100% 99% 100% 100%  Weight:      Height:       General exam: Appears calm and comfortable  Respiratory system: Decreased breathing  sounds without wheezes. Respiratory effort normal. Cardiovascular system: S1 & S2 heard, RRR. No JVD, murmurs, rubs, gallops or clicks.  Gastrointestinal system: Abdomen is nondistended, soft and nontender. No organomegaly or masses felt. Normal bowel sounds heard. Central nervous system: Alert and oriented. No focal neurological deficits. Extremities: Bilateral leg amputation. Skin: No  rashes, lesions or ulcers Psychiatry: Judgement and insight appear normal. Mood & affect appropriate.    Data Reviewed:  There are no new results to review at this time.  Family Communication: None  Disposition: Status is: Inpatient Remains inpatient appropriate because: Severity of disease.     Time spent: 35 minutes  Author: Marrion Coy, MD 04/09/2023 12:56 PM  For on call review [email or web address].

## 2023-04-09 NOTE — Care Management Important Message (Signed)
Important Message  Patient Details  Name: Tyler [name] MRN: [id number] Date of Birth: 1955/11/22   Important Message Given:  Yes - Medicare IM  Patient is in an isolation room so I reviewed the Important Message from Medicare with him over the phone [phone] and he stated he understood his rights. I wished him a speedy recovery and thanked him for his time.   [name] A Annaliese Saez 04/09/2023, 3:15 PM

## 2023-04-10 DIAGNOSIS — J9621 Acute and chronic respiratory failure with hypoxia: Secondary | ICD-10-CM | POA: Diagnosis not present

## 2023-04-10 DIAGNOSIS — U071 COVID-19: Secondary | ICD-10-CM | POA: Diagnosis not present

## 2023-04-10 DIAGNOSIS — J441 Chronic obstructive pulmonary disease with (acute) exacerbation: Secondary | ICD-10-CM | POA: Diagnosis not present

## 2023-04-10 LAB — FERRITIN: Ferritin: 158 ng/mL (ref 24–336)

## 2023-04-10 LAB — PROTIME-INR
INR: 3.1 — ABNORMAL HIGH (ref 0.8–1.2)
Prothrombin Time: 32.4 s — ABNORMAL HIGH (ref 11.4–15.2)

## 2023-04-10 MED ORDER — WARFARIN SODIUM 2 MG PO TABS
2.0000 mg | ORAL_TABLET | Freq: Once | ORAL | Status: AC
  Administered 2023-04-10: 2 mg via ORAL
  Filled 2023-04-10: qty 1

## 2023-04-10 NOTE — Plan of Care (Signed)

## 2023-04-10 NOTE — Progress Notes (Signed)
Progress Note   Patient: Tyler [name] AVW:[id number] DOB: 1955-11-25 DOA: 04/02/2023     8 DOS: the patient was seen and examined on 04/10/2023   Brief hospital course: 67 year old man with past medical history of chronic respiratory failure on 3 L oxygen, COPD, bronchiectasis, atrial fibrillation on Coumadin, sleep apnea on BiPAP at night, heart failure with reduced ejection fraction, AICD, CAD, hypertension, factor V Leiden mutation, hyperlipidemia, bilateral lower extremity amputations.  He presents to the hospital with respiratory distress coughing up green sputum he was found to be COVID-positive.  11/9.  Patient was tried off the BiPAP a couple times and asked to go back on the BiPAP.   11/10.  Patient received baricitinib and increased dose of Solu-Medrol to 80 mg daily. 11/12.  Patient with more trouble breathing today than yesterday.  Blood pressure better today can give usual Toprol and Lasix dose.  Restart Entresto this evening. 11/13.  Off of BiPAP during daytime.  Still have a large amount of mucus production.   Principal Problem:   Acute on chronic hypoxic respiratory failure (HCC) Active Problems:   COVID-19 virus infection   COPD with acute exacerbation (HCC)   Chronic systolic CHF (congestive heart failure), NYHA class 3 (HCC)   AF (paroxysmal atrial fibrillation) (HCC)   Coronary artery disease   Sleep apnea   Essential hypertension, benign   BPH (benign prostatic hyperplasia)   Assessment and Plan: Acute on chronic hypoxic respiratory failure (HCC) COVID-19 virus infection COPD with acute exacerbation [facility]) Patient admitted with respiratory distress and placed on BiPAP.  Patient was on 3 L oxygen at daytime at home, trilogy at night due to chronic respiratory failure. Worsening oxygenation mainly due to COPD exacerbation.  I personally reviewed patient chest x-ray image, no significant infiltration. No evidence of bacterial pneumonia or volume  overload. Appreciate pulmonology consult.  Currently patient is on IV steroids.  He still has large amount of thick mucus, on Zithromax.  Will continue scheduled bronchodilator, also added Mucomyst. Discussed with pulmonology, patient to COPD appear to be terminal.  Recommended hospice care.  Patient would consider that.  Most likely patient can be discharged home with hospice care.  However, we will continue treating him during the weekend, will make a decision on Monday.  Patient continues to make progress, still coughing up a large amount of mucus.  Short of breath seem to be better.  However, patient still requiring IV morphine for air hunger.     Chronic systolic CHF (congestive heart failure), NYHA class 3 (HCC) Patient has a history of HFrEF with a EF as low as 30% with improvement to 35-40% on most recent echo.  No evidence of volume overload.  Restarted Entresto.   AF (paroxysmal atrial fibrillation) (HCC) Pharmacy to dose Coumadin, Toprol for rate control.   Coronary artery disease Continue Toprol and aspirin.   Sleep apnea BiPAP at bedtime   Essential hypertension, benign Continue Toprol and Lasix.   BPH (benign prostatic hyperplasia) On Flomax          Subjective:  Still short of breath with nursing.  Cough with large amount of mucus.  Physical Exam: Vitals:   04/10/23 0048 04/10/23 0331 04/10/23 0747 04/10/23 0904  BP:  124/78  122/61  Pulse:  77  73  Resp:  20  20  Temp:  97.9 F (36.6 C)  97.9 F (36.6 C)  TempSrc:  Axillary    SpO2: 100% 99% 99% 100%  Weight:  Height:       General exam: Appears calm and comfortable  Respiratory system: Significant decreased breathing sounds. Respiratory effort normal. Cardiovascular system: S1 & S2 heard, RRR. No JVD, murmurs, rubs, gallops or clicks. No pedal edema. Gastrointestinal system: Abdomen is nondistended, soft and nontender. No organomegaly or masses felt. Normal bowel sounds heard. Central nervous  system: Alert and oriented. No focal neurological deficits. Extremities: Symmetric 5 x 5 power. Skin: No rashes, lesions or ulcers Psychiatry: Judgement and insight appear normal. Mood & affect appropriate.    Data Reviewed:  There are no new results to review at this time.  Family Communication: None  Disposition: Status is: Inpatient Remains inpatient appropriate because: Severity of disease.     Time spent: 35 minutes  Author: Marrion Coy, MD 04/10/2023 12:40 PM  For on call review [email or web address].

## 2023-04-10 NOTE — TOC Progression Note (Signed)
Transition of Care [facility]) - Progression Note    Patient Details  Name: Tyler [name] MRN: [id number] Date of Birth: 1955-08-23  Transition of Care [facility]) CM/SW Contact  Bing Quarry, RN Phone Number: 04/10/2023, 5:12 PM  Clinical Narrative: 11/16: Palliative following. Per provider notes of today, recommending hospice, but waiting to see how patient does over the weekend.   Gabriel Cirri MSN RN CM  Care Management Department.  [place]  [facility] Direct Dial: [phone] Main Office Phone: [phone] Weekends Only             Expected Discharge Plan and Services                                               Social Determinants of Health (SDOH) Interventions SDOH Screenings   Food Insecurity: No Food Insecurity (04/03/2023)  Housing: Low Risk  (04/03/2023)  Transportation Needs: No Transportation Needs (04/03/2023)  Utilities: Not At Risk (04/03/2023)  Depression (PHQ2-9): Low Risk  (07/31/2021)  Stress: No Stress Concern Present (02/01/2020)  Tobacco Use: Medium Risk (04/02/2023)    Readmission Risk Interventions     No data to display

## 2023-04-10 NOTE — Consult Note (Signed)
Pharmacy Consult Note - Anticoagulation  Pharmacy Consult for warfarin Indication: atrial fibrillation  PATIENT MEASUREMENTS: Height: 6\' 1"  (185.4 cm) Weight: 73.5 kg (162 lb) IBW/kg (Calculated) : 79.9 HEPARIN DW (KG): 73.5  VITAL SIGNS: Temp: 97.9 F (36.6 C) (11/16 0904) Temp Source: Axillary (11/16 0331) BP: 122/61 (11/16 0904) Pulse Rate: 73 (11/16 0904)  Recent Labs    04/10/23 0541  LABPROT 32.4*  INR 3.1*    Estimated Creatinine Clearance: 93.2 mL/min (by C-G formula based on SCr of 0.66 mg/dL).  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   Atrial fibrillation (HCC)    Cervical spinal stenosis    with neuropathy   CHF (congestive heart failure) (HCC)    Constipation    COPD (chronic obstructive pulmonary disease) (HCC)    COPD with acute exacerbation (HCC) 10/13/2016   Coronary artery disease    Depression    Dyspnea    Dysrhythmia    atrial fibrillation   Emphysema of lung (HCC)    Factor 5 Leiden mutation, heterozygous (HCC)    on coumadin   Factor V Leiden mutation (HCC)    GERD (gastroesophageal reflux disease)    Hypertension    Lung nodule seen on imaging study    being followed by dr. Belia Heman. just watching it for last few years, without change   Mitral valve insufficiency    Moderate tricuspid insufficiency    Myocardial infarction [facility]) 2004   stent placed, pacemaker implanted 2005   Osteomyelitis (HCC) 10/2019   left foot   Oxygen dependent    requires 2L nasal prong oxygen 24 hours a day   Peripheral vascular disease (HCC)    Presence of permanent cardiac pacemaker [phone]   PVD (peripheral vascular disease) (HCC)    Sleep apnea    waiting to have sleep study. Used bipap while hospitalized and  said it was great for him.   Baseline anticoagulation labs: Recent Labs    04/08/23 0621 04/09/23 0555 04/10/23 0541  INR 2.5* 2.7* 3.1*   ASSESSMENT: 67 y.o. male with PMH Afib, factor V Leiden mutation, COPD is presenting with chest pain and cough. Patient is on chronic anticoagulation with warfarin for Afib. HR currently controlled and CHADSVASc is at least 4 (age, CHF, HTN, CAD). Pharmacy has been consulted to initiate and manage heparin intravenous infusion.  Warfarin PTA dose 4 mg daily Total weekly warfarin: 28 mg Last dose on 11/7 TTR : unknown  Interacting medications:  Azithromycin x1 11/8 Decadron (steroids)  Nov/13: Patient remains within therapeutic range and trending downwards with stable Hgn and PLT since admission. DDI with azithromycin and steroid (solumedrol > decadron) now stable on day 5. Patient has history of factor V Leiden mutation that puts them at risk for clotting in addition to A.Fib (CHADS-Vasc4) and will try to avoid SUB-therapeutic INR to avoid need to bridge with LWMH/heparin.  -azithromycin completed  Goal(s) of therapy: INR 2 - 3 Monitor platelets by anticoagulation protocol: Yes   DATE INR WARFARIN DOSE COMMENTS  11/8 2.4 4mg    11/9 2.6 4mg    11/10 3.9 HELD SUPRA-therapeutic  11/11 3.2 2 mg    11/12 2.6 2 mg Therapeutic   11/13 2.2 4 mg Therapeutic  11/14 2.5 4 mg Therapeutic  11/15  2.7 4 mg therapeutic  11/16 3.1  Slightly supratherapeutic             PLAN: Warfarin 2 mg x 1 today at 1600. Continue daily INR  Check CBC at least every 7 days for inpatients on warfarin (last CBC 11/13)  Velicia Dejager A, PharmD 04/10/2023 12:13 PM

## 2023-04-10 NOTE — Plan of Care (Signed)
  Problem: Education: Goal: Knowledge of risk factors and measures for prevention of condition will improve Outcome: Progressing   Problem: Coping: Goal: Psychosocial and spiritual needs will be supported Outcome: Progressing   Problem: Nutrition: Goal: Adequate nutrition will be maintained Outcome: Progressing

## 2023-04-11 DIAGNOSIS — J441 Chronic obstructive pulmonary disease with (acute) exacerbation: Secondary | ICD-10-CM | POA: Diagnosis not present

## 2023-04-11 DIAGNOSIS — J9621 Acute and chronic respiratory failure with hypoxia: Secondary | ICD-10-CM | POA: Diagnosis not present

## 2023-04-11 DIAGNOSIS — U071 COVID-19: Secondary | ICD-10-CM | POA: Diagnosis not present

## 2023-04-11 LAB — FERRITIN: Ferritin: 171 ng/mL (ref 24–336)

## 2023-04-11 LAB — PROTIME-INR
INR: 3 — ABNORMAL HIGH (ref 0.8–1.2)
Prothrombin Time: 31.4 s — ABNORMAL HIGH (ref 11.4–15.2)

## 2023-04-11 MED ORDER — WARFARIN SODIUM 2.5 MG PO TABS
2.5000 mg | ORAL_TABLET | Freq: Once | ORAL | Status: AC
  Administered 2023-04-11: 2.5 mg via ORAL
  Filled 2023-04-11: qty 1

## 2023-04-11 MED ORDER — LEVOFLOXACIN 500 MG PO TABS
500.0000 mg | ORAL_TABLET | Freq: Every day | ORAL | Status: DC
  Administered 2023-04-11 – 2023-04-12 (×2): 500 mg via ORAL
  Filled 2023-04-11 (×2): qty 1

## 2023-04-11 MED ORDER — TORSEMIDE 20 MG PO TABS
20.0000 mg | ORAL_TABLET | Freq: Every day | ORAL | Status: DC
  Administered 2023-04-11 – 2023-04-12 (×2): 20 mg via ORAL
  Filled 2023-04-11 (×2): qty 1

## 2023-04-11 MED ORDER — DEXAMETHASONE SODIUM PHOSPHATE 10 MG/ML IJ SOLN
6.0000 mg | Freq: Two times a day (BID) | INTRAMUSCULAR | Status: DC
  Administered 2023-04-11 – 2023-04-12 (×3): 6 mg via INTRAVENOUS
  Filled 2023-04-11 (×3): qty 1

## 2023-04-11 MED ORDER — MIDODRINE HCL 5 MG PO TABS
5.0000 mg | ORAL_TABLET | Freq: Three times a day (TID) | ORAL | Status: DC
  Administered 2023-04-11 – 2023-04-12 (×3): 5 mg via ORAL
  Filled 2023-04-11 (×3): qty 1

## 2023-04-11 NOTE — Progress Notes (Signed)
Pulmonary Medicine          Date: 04/11/2023,   MRN# [id number] MONEY ETLING [birth year]     AdmissionWeight: 73.5 kg                 CurrentWeight: 73.5 kg       SUBJECTIVE    Patient is sitting up in bed, wife at bedside.  He is on 4L/min .  We will obtain CRP trend and ferritin.  Plan to dc pulmicort, switch steroids to dexamethasone 10 bid.  Starting zithromax 500 for copd.    Patient remains on 4L/min with severe dyspnea even at rest.  Today we discussed brining in his Home medication Otuhaire via nebulizer for use while hospitalized.  Additionally will obtain CXR and diurese interstitial edema due to pan isnspiratory and expiratory wheezing.   04/11/23- patient is silghtly improved, he's now on less dexamethasone. We are tapering steroids. . Ensifenrine via jet nebulizer has been ordered.   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   Atrial fibrillation (HCC)    Cervical spinal stenosis    with neuropathy   CHF (congestive heart failure) (HCC)    Constipation    COPD (chronic obstructive pulmonary disease) (HCC)    COPD with acute exacerbation (HCC) 10/13/2016   Coronary artery disease    Depression    Dyspnea    Dysrhythmia    atrial fibrillation   Emphysema of lung (HCC)    Factor 5 Leiden mutation, heterozygous (HCC)    on coumadin   Factor V Leiden mutation (HCC)    GERD (gastroesophageal reflux disease)    Hypertension    Lung nodule seen on imaging study    being followed by dr. Belia Heman. just watching it for last few years, without change   Mitral valve insufficiency    Moderate tricuspid insufficiency    Myocardial infarction [facility]) 2004   stent placed, pacemaker implanted 2005   Osteomyelitis (HCC)  10/2019   left foot   Oxygen dependent    requires 2L nasal prong oxygen 24 hours a day   Peripheral vascular disease (HCC)    Presence of permanent cardiac pacemaker [phone]   PVD (peripheral vascular disease) (HCC)    Sleep apnea    waiting to have sleep study. Used bipap while hospitalized and said it was great for him.     SURGICAL HISTORY   Past Surgical History:  Procedure Laterality Date   ABOVE KNEE LEG AMPUTATION Right    after below the knee amputation    AMPUTATION Left 11/22/2019   Procedure: AMPUTATION BELOW KNEE;  Surgeon: Annice Needy, MD;  Location: [facility] OR[facility];  Service: Vascular;  Laterality: Left;   AMPUTATION TOE Left 08/04/2019   Procedure: AMPUTATION TOE MPJ LEFT;  Surgeon: Gwyneth Revels, DPM;  Location: [facility];  Service: Podiatry;  Laterality: Left;   AMPUTATION TOE Left 10/06/2019   Procedure: AMPUTATION TOE MPJ T1,T2 LEFT;  Surgeon: Gwyneth Revels, DPM;  Location: [facility];  Service: Podiatry;  Laterality: Left;   APPLICATION OF WOUND VAC Left 01/12/2018   Procedure: APPLICATION OF WOUND VAC;  Surgeon: Annice Needy, MD;  Location: [facility];  Service: Vascular;  Laterality: Left;   BELOW KNEE LEG  AMPUTATION Right    CATARACT EXTRACTION, BILATERAL Bilateral    COLONOSCOPY N/A 02/19/2020   Procedure: COLONOSCOPY;  Surgeon: Regis Bill, MD;  Location: [facility];  Service: Endoscopy;  Laterality: N/A;   COLONOSCOPY WITH PROPOFOL N/A 08/27/2020   Procedure: COLONOSCOPY WITH PROPOFOL;  Surgeon: Regis Bill, MD;  Location: [facility];  Service: Endoscopy;  Laterality: N/A;  REQ MID AM   CORONARY ANGIOPLASTY  2005   stent x 1 placed   ENDARTERECTOMY FEMORAL Left 08/11/2017   Procedure: ENDARTERECTOMY FEMORAL;  Surgeon: Annice Needy, MD;  Location: [facility];  Service: Vascular;  Laterality: Left;   ESOPHAGOGASTRODUODENOSCOPY N/A 02/19/2020   Procedure: ESOPHAGOGASTRODUODENOSCOPY (EGD);  Surgeon: Regis Bill, MD;  Location: [facility] ENDOSCOPY;   Service: Endoscopy;  Laterality: N/A;   HEMATOMA EVACUATION Left 01/12/2018   Procedure: EVACUATION HEMATOMA ( DRAINING OF SEROMA);  Surgeon: Annice Needy, MD;  Location: [facility];  Service: Vascular;  Laterality: Left;   IMPLANTABLE CARDIOVERTER DEFIBRILLATOR (ICD) GENERATOR CHANGE Left 02/10/2017   Procedure: ICD GENERATOR CHANGE;  Surgeon: Marcina Millard, MD;  Location: [facility];  Service: Cardiovascular;  Laterality: Left;   INSERT / REPLACE / REMOVE PACEMAKER  [id number], 2018   LOWER EXTREMITY ANGIOGRAPHY Left 06/07/2017   Procedure: LOWER EXTREMITY ANGIOGRAPHY;  Surgeon: Annice Needy, MD;  Location: [facility];  Service: Cardiovascular;  Laterality: Left;   LOWER EXTREMITY ANGIOGRAPHY Left 10/05/2019   Procedure: LOWER EXTREMITY ANGIOGRAPHY;  Surgeon: Annice Needy, MD;  Location: [facility];  Service: Cardiovascular;  Laterality: Left;   LOWER EXTREMITY INTERVENTION  06/07/2017   Procedure: LOWER EXTREMITY INTERVENTION;  Surgeon: Annice Needy, MD;  Location: [facility];  Service: Cardiovascular;;   PERIPHERAL VASCULAR CATHETERIZATION Left 12/09/2015   Procedure: Lower Extremity Angiography;  Surgeon: Annice Needy, MD;  Location: [facility];  Service: Cardiovascular;  Laterality: Left;   PERIPHERAL VASCULAR CATHETERIZATION  12/09/2015   Procedure: Lower Extremity Intervention;  Surgeon: Annice Needy, MD;  Location: [facility];  Service: Cardiovascular;;   TONSILLECTOMY     TRANSMETATARSAL AMPUTATION Left 10/20/2019   Procedure: TRANSMETATARSAL AMPUTATION;  Surgeon: Linus Galas, DPM;  Location: [facility];  Service: Podiatry;  Laterality: Left;     FAMILY HISTORY   Family History  Problem Relation Age of Onset   Cancer Mother    Cancer Father    Heart disease Father      SOCIAL HISTORY   Social History   Tobacco Use   Smoking status: Former    Current packs/day: 0.00    Average packs/day: 1 pack/day for 42.0 years (42.0 ttl  pk-yrs)    Types: Cigarettes    Start date: 09/24/1971    Quit date: 09/23/2013    Years since quitting: 9.5   Smokeless tobacco: Never  Vaping Use   Vaping status: Never Used  Substance Use Topics   Alcohol use: No    Comment: quit 6 years ago   Drug use: No     MEDICATIONS    Home Medication:    Current Medication:  Current Facility-Administered Medications:    acetylcysteine (MUCOMYST) 20 % nebulizer / oral solution 3 mL, 3 mL, Nebulization, BID, Chipper Herb, Dekui, MD, 3 mL at 04/07/23 2056   ALPRAZolam (XANAX) tablet 1 mg, 1 mg, Oral, BID PRN, Verdene Lennert, MD, 1 mg at 04/11/23 1127   aspirin EC tablet 81 mg, 81 mg, Oral, QPC supper, Verdene Lennert, MD, 81 mg at  04/10/23 1759   citalopram (CELEXA) tablet 20 mg, 20 mg, Oral, Daily, Verdene Lennert, MD, 20 mg at 04/11/23 1610   dexamethasone (DECADRON) injection 6 mg, 6 mg, Intravenous, Q12H, Karna Christmas, Corri Delapaz, MD, 6 mg at 04/11/23 0900   docusate sodium (COLACE) capsule 100 mg, 100 mg, Oral, Daily PRN, Verdene Lennert, MD   Ensifentrine SUSP 1 ampule, 1 ampule, Inhalation, BID, Vida Rigger, MD   furosemide (LASIX) tablet 20 mg, 20 mg, Oral, Daily, Renae Gloss, Richard, MD, 20 mg at 04/11/23 9604   gabapentin (NEURONTIN) capsule 300 mg, 300 mg, Oral, TID, Verdene Lennert, MD, 300 mg at 04/11/23 0837   ipratropium-albuterol (DUONEB) 0.5-2.5 (3) MG/3ML nebulizer solution 3 mL, 3 mL, Nebulization, Q6H, Verdene Lennert, MD, 3 mL at 04/11/23 0732   ipratropium-albuterol (DUONEB) 0.5-2.5 (3) MG/3ML nebulizer solution 3 mL, 3 mL, Nebulization, Q4H PRN, Marrion Coy, MD   metoprolol succinate (TOPROL-XL) 24 hr tablet 25 mg, 25 mg, Oral, Daily, Renae Gloss, Richard, MD, 25 mg at 04/11/23 5409   morphine (PF) 2 MG/ML injection 1 mg, 1 mg, Intravenous, Q4H PRN, Alford Highland, MD, 1 mg at 04/11/23 0844   oxyCODONE-acetaminophen (PERCOCET) 7.5-325 MG per tablet 1-2 tablet, 1-2 tablet, Oral, Q6H PRN, Verdene Lennert, MD, 1 tablet at 04/11/23 1127    pantoprazole (PROTONIX) EC tablet 40 mg, 40 mg, Oral, QAC breakfast, Verdene Lennert, MD, 40 mg at 04/11/23 0845   pravastatin (PRAVACHOL) tablet 20 mg, 20 mg, Oral, q1800, Verdene Lennert, MD, 20 mg at 04/10/23 1759   sacubitril-valsartan (ENTRESTO) 24-26 mg per tablet, 1 tablet, Oral, BID, Alford Highland, MD, 1 tablet at 04/11/23 8119   sodium chloride (OCEAN) 0.65 % nasal spray 1 spray, 1 spray, Each Nare, PRN, Marrion Coy, MD   sodium chloride flush (NS) 0.9 % injection 3 mL, 3 mL, Intravenous, Q12H, Verdene Lennert, MD, 3 mL at 04/10/23 2159   tamsulosin (FLOMAX) capsule 0.4 mg, 0.4 mg, Oral, QPC breakfast, Verdene Lennert, MD, 0.4 mg at 04/11/23 1478   warfarin (COUMADIN) tablet 2.5 mg, 2.5 mg, Oral, ONCE-1600, Angelique Blonder, RPH   Warfarin - Pharmacist Dosing Inpatient, , Does not apply, q1600, Orson Aloe, [facility], Given at 04/09/23 1700    ALLERGIES   Apixaban and Rivaroxaban     REVIEW OF SYSTEMS    Review of Systems:  Gen:  Denies  fever, sweats, chills weigh loss  HEENT: Denies blurred vision, double vision, ear pain, eye pain, hearing loss, nose bleeds, sore throat Cardiac:  No dizziness, chest pain or heaviness, chest tightness,edema Resp:   less  cough + shortness of breath,+ mild wheezing, no  hemoptysis,  Gi: Denies swallowing difficulty, stomach pain, nausea or vomiting, diarrhea, constipation, bowel incontinence Gu:  Denies bladder incontinence, burning urine Ext:   Denies Joint pain, stiffness or swelling Skin: Denies  skin rash, easy bruising or bleeding or hives Endoc:  Denies polyuria, polydipsia , polyphagia or weight change Psych:   Denies depression, insomnia or hallucinations   Other:  All other systems negative   VS: BP (!) 81/51 (BP Location: Right Arm)   Pulse 75   Temp 97.8 F (36.6 C) (Oral)   Resp 18   Ht 6\' 1"  (1.854 m)   Wt 73.5 kg   SpO2 100%   BMI 21.37 kg/m      PHYSICAL EXAM    GENERAL:NAD, no fevers, chills, no  weakness no fatigue HEAD: Normocephalic, atraumatic.  EYES: Pupils equal, round, reactive to light. Extraocular muscles intact. No scleral icterus.  MOUTH:  Moist mucosal membrane. Dentition intact. No abscess noted.  EAR, NOSE, THROAT: Clear without exudates. No external lesions.  NECK: Supple. No thyromegaly. No nodules. No JVD.  PULMONARY: Diffuse coarse rhonchi right sided +wheezes CARDIOVASCULAR: S1 and S2. Regular rate and rhythm. No murmurs, rubs, or gallops. No edema. Pedal pulses 2+ bilaterally.  GASTROINTESTINAL: Soft, nontender, nondistended. No masses. Positive bowel sounds. No hepatosplenomegaly.  MUSCULOSKELETAL: No swelling, clubbing, or edema. Range of motion full in all extremities.  NEUROLOGIC: Cranial nerves II through XII are intact. No gross focal neurological deficits. Sensation intact. Reflexes intact.  SKIN: No ulceration, lesions, rashes, or cyanosis. Skin warm and dry. Turgor intact.  PSYCHIATRIC: Mood, affect within normal limits. The patient is awake, alert and oriented x 3. Insight, judgment intact.       IMAGING    DG Chest Port 1 View  Result Date: 04/06/2023 CLINICAL DATA:  Pulmonary edema. Chronic hypoxia with respiratory failure. EXAM: PORTABLE CHEST 1 VIEW COMPARISON:  04/02/2023 FINDINGS: Chronic cardiomegaly. Pacemaker/AICD remains in place. Aortic atherosclerosis and tortuosity. Chronic lung disease with emphysema and pulmonary scarring. Chronic calcified granuloma in the right upper lobe. No evidence of active infiltrate, collapse or effusion. IMPRESSION: Chronic cardiomegaly. Pacemaker/AICD. Chronic lung disease with emphysema and pulmonary scarring. No active disease. Electronically Signed   By: Paulina Fusi M.D.   On: 04/06/2023 17:11   DG Chest Port 1 View  Result Date: 04/02/2023 CLINICAL DATA:  Shortness of breath. EXAM: PORTABLE CHEST 1 VIEW COMPARISON:  March 10, 2022. FINDINGS: Stable cardiomediastinal silhouette. Left-sided defibrillator is  unchanged in position. Emphysematous disease is noted in the upper lobes. No acute pulmonary disease. Bony thorax is unremarkable. IMPRESSION: No active disease. Emphysema (ICD10-J43.9). Electronically Signed   By: Lupita Raider M.D.   On: 04/02/2023 15:15      ASSESSMENT/PLAN   This is a pleasant gentleman, presents with dyspnea, covid positive, wheezing, and still relatively tight. C/w copd exacerbation. He was on bipap most of the day ( off now). Not as good as last pm  Does not appear to be in failure. 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 He is aware he still at risk of being intubated.    COVID19     Dexamethasone tapering     Pcp ppx    Metaneb TID   Ensifenrine BID     Copd exacerbation, still very tight, but no use of accessory muscles, good color and speaking full sentences -duo neb/ pulmicort, -dexamethasone 8 bid -metaneb -PT/OT if able  End stage COPD very poor prognosis   Sleep apnea -bip[ap at night on present settings   Hx of chf, not in faliure at present. Afib ( rate controlled) -will continue with out patient cardiology recs ( entresto, toprol, asa,warfarin)    Transient hypotension Midodrine 5 tid     Thank you for allowing me to participate in the care of this patient.   Patient/Family are satisfied with care plan and all questions have been answered.  This document was prepared using Dragon voice recognition software and may include unintentional dictation errors.    Vida Rigger, M.D.  Pulmonary & Critical Care Medicine  [facility]

## 2023-04-11 NOTE — Consult Note (Signed)
Pharmacy Consult Note - Anticoagulation  Pharmacy Consult for warfarin Indication: atrial fibrillation  PATIENT MEASUREMENTS: Height: 6\' 1"  (185.4 cm) Weight: 73.5 kg (162 lb) IBW/kg (Calculated) : 79.9 HEPARIN DW (KG): 73.5  VITAL SIGNS: Temp: 98 F (36.7 C) (11/17 0840) BP: 104/50 (11/17 0840) Pulse Rate: 77 (11/17 0840)  Recent Labs    04/11/23 0408  LABPROT 31.4*  INR 3.0*    Estimated Creatinine Clearance: 93.2 mL/min (by C-G formula based on SCr of 0.66 mg/dL).  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   Atrial fibrillation (HCC)    Cervical spinal stenosis    with neuropathy   CHF (congestive heart failure) (HCC)    Constipation    COPD (chronic obstructive pulmonary disease) (HCC)    COPD with acute exacerbation (HCC) 10/13/2016   Coronary artery disease    Depression    Dyspnea    Dysrhythmia    atrial fibrillation   Emphysema of lung (HCC)    Factor 5 Leiden mutation, heterozygous (HCC)    on coumadin   Factor V Leiden mutation (HCC)    GERD (gastroesophageal reflux disease)    Hypertension    Lung nodule seen on imaging study    being followed by dr. Belia Heman. just watching it for last few years, without change   Mitral valve insufficiency    Moderate tricuspid insufficiency    Myocardial infarction [facility]) 2004   stent placed, pacemaker implanted 2005   Osteomyelitis (HCC) 10/2019   left foot   Oxygen dependent    requires 2L nasal prong oxygen 24 hours a day   Peripheral vascular disease (HCC)    Presence of permanent cardiac pacemaker [phone]   PVD (peripheral vascular disease) (HCC)    Sleep apnea    waiting to have sleep study. Used bipap while hospitalized and said it was great for him.   Baseline  anticoagulation labs: Recent Labs    04/09/23 0555 04/10/23 0541 04/11/23 0408  INR 2.7* 3.1* 3.0*   ASSESSMENT: 67 y.o. male with PMH Afib, factor V Leiden mutation, COPD is presenting with chest pain and cough. Patient is on chronic anticoagulation with warfarin for Afib. HR currently controlled and CHADSVASc is at least 4 (age, CHF, HTN, CAD). Pharmacy has been consulted to initiate and manage heparin intravenous infusion.  Warfarin PTA dose 4 mg daily Total weekly warfarin: 28 mg Last dose on 11/7 TTR : unknown  Interacting medications:  Azithromycin x1 11/8 Decadron (steroids)  Nov/13: Patient remains within therapeutic range and trending downwards with stable Hgn and PLT since admission. DDI with azithromycin and steroid (solumedrol > decadron) now stable on day 5. Patient has history of factor V Leiden mutation that puts them at risk for clotting in addition to A.Fib (CHADS-Vasc4) and will try to avoid SUB-therapeutic INR to avoid need to bridge with LWMH/heparin.  -azithromycin completed  Goal(s) of therapy: INR 2 - 3 Monitor platelets by anticoagulation protocol: Yes   DATE INR WARFARIN DOSE COMMENTS  11/8 2.4 4mg    11/9 2.6 4mg    11/10 3.9 HELD SUPRA-therapeutic  11/11 3.2 2 mg    11/12 2.6 2 mg Therapeutic   11/13 2.2 4 mg Therapeutic  11/14 2.5 4 mg Therapeutic  11/15 2.7 4 mg therapeutic  11/16 3.1 2 mg Slightly supratherapeutic  11/17 3.0  therapeutic        PLAN: Warfarin 2.5 mg x 1 today at 1600. Continue daily INR  Check CBC at least every 7 days for inpatients on warfarin (last CBC 11/13)  Izzy Doubek A, PharmD 04/11/2023 12:34 PM

## 2023-04-11 NOTE — Progress Notes (Signed)
Progress Note   Patient: Tyler [name]:[id number] DOB: 1956-02-24 DOA: 04/02/2023     9 DOS: the patient was seen and examined on 04/11/2023   Brief hospital course: 67 year old man with past medical history of chronic respiratory failure on 3 L oxygen, COPD, bronchiectasis, atrial fibrillation on Coumadin, sleep apnea on BiPAP at night, heart failure with reduced ejection fraction, AICD, CAD, hypertension, factor V Leiden mutation, hyperlipidemia, bilateral lower extremity amputations.  He presents to the hospital with respiratory distress coughing up green sputum he was found to be COVID-positive.  11/9.  Patient was tried off the BiPAP a couple times and asked to go back on the BiPAP.   11/10.  Patient received baricitinib and increased dose of Solu-Medrol to 80 mg daily. 11/12.  Patient with more trouble breathing today than yesterday.  Blood pressure better today can give usual Toprol and Lasix dose.  Restart Entresto this evening. 11/13.  Off of BiPAP during daytime.  Still have a large amount of mucus production.   Principal Problem:   Acute on chronic hypoxic respiratory failure (HCC) Active Problems:   COVID-19 virus infection   COPD with acute exacerbation (HCC)   Chronic systolic CHF (congestive heart failure), NYHA class 3 (HCC)   AF (paroxysmal atrial fibrillation) (HCC)   Coronary artery disease   Sleep apnea   Essential hypertension, benign   BPH (benign prostatic hyperplasia)   Assessment and Plan: Acute on chronic hypoxic respiratory failure (HCC) COVID-19 virus infection COPD with acute exacerbation [facility]) Patient admitted with respiratory distress and placed on BiPAP.  Patient was on 3 L oxygen at daytime at home, trilogy at night due to chronic respiratory failure. Worsening oxygenation mainly due to COPD exacerbation.  I personally reviewed patient chest x-ray image, no significant infiltration. No evidence of bacterial pneumonia or volume  overload. Appreciate pulmonology consult.  Currently patient is on IV steroids.  He still has large amount of thick mucus, on Zithromax.  Will continue scheduled bronchodilator, also added Mucomyst. Discussed with pulmonology, patient to COPD appear to be terminal.  Recommended hospice care.  Patient would consider that.  Most likely patient can be discharged home with hospice care.  However, we will continue treating him during the weekend, will make a decision on Monday.   Patient continues to make progress, still coughing up a large amount of mucus.  Short of breath seem to be better.  However, patient still requiring IV morphine for air hunger.  Condition stable today, plan to discharge home tomorrow with hospice referral.     Chronic systolic CHF (congestive heart failure), NYHA class 3 (HCC) Patient has a history of HFrEF with a EF as low as 30% with improvement to 35-40% on most recent echo.  No evidence of volume overload.  Restarted Entresto.   AF (paroxysmal atrial fibrillation) (HCC) Pharmacy to dose Coumadin, Toprol for rate control.   Coronary artery disease Continue Toprol and aspirin.   Sleep apnea BiPAP at bedtime   Essential hypertension, benign Continue Toprol and Lasix.   BPH (benign prostatic hyperplasia) On Flomax        Subjective:  Patient still has significant short of breath with minimal exertion.  Cough, large amount of mucus.  Physical Exam: Vitals:   04/11/23 0256 04/11/23 0359 04/11/23 0735 04/11/23 0840  BP:  101/68  (!) 104/50  Pulse: 72 75  77  Resp:  20  20  Temp:  (!) 97.5 F (36.4 C)  98 F (36.7 C)  TempSrc:  SpO2: 100% 100% 97% 100%  Weight:      Height:       General exam: Appears calm and comfortable  Respiratory system: Decreased breathing sounds. Respiratory effort normal. Cardiovascular system: S1 & S2 heard, RRR. No JVD, murmurs, rubs, gallops or clicks. No pedal edema. Gastrointestinal system: Abdomen is nondistended,  soft and nontender. No organomegaly or masses felt. Normal bowel sounds heard. Central nervous system: Alert and oriented. No focal neurological deficits. Extremities: Symmetric 5 x 5 power. Skin: No rashes, lesions or ulcers Psychiatry: Judgement and insight appear normal. Mood & affect appropriate.    Data Reviewed:  There are no new results to review at this time.  Family Communication: None  Disposition: Status is: Inpatient Remains inpatient appropriate because: Severity of disease     Time spent: 35 minutes  Author: Marrion Coy, MD 04/11/2023 11:26 AM  For on call review [email or web address].

## 2023-04-12 DIAGNOSIS — U071 COVID-19: Secondary | ICD-10-CM | POA: Diagnosis not present

## 2023-04-12 DIAGNOSIS — J441 Chronic obstructive pulmonary disease with (acute) exacerbation: Secondary | ICD-10-CM | POA: Diagnosis not present

## 2023-04-12 DIAGNOSIS — J9621 Acute and chronic respiratory failure with hypoxia: Secondary | ICD-10-CM | POA: Diagnosis not present

## 2023-04-12 LAB — CBC
HCT: 35.4 % — ABNORMAL LOW (ref 39.0–52.0)
Hemoglobin: 12.1 g/dL — ABNORMAL LOW (ref 13.0–17.0)
MCH: 31.4 pg (ref 26.0–34.0)
MCHC: 34.2 g/dL (ref 30.0–36.0)
MCV: 91.9 fL (ref 80.0–100.0)
Platelets: 302 10*3/uL (ref 150–400)
RBC: 3.85 MIL/uL — ABNORMAL LOW (ref 4.22–5.81)
RDW: 13.2 % (ref 11.5–15.5)
WBC: 11.7 10*3/uL — ABNORMAL HIGH (ref 4.0–10.5)
nRBC: 0 % (ref 0.0–0.2)

## 2023-04-12 LAB — BASIC METABOLIC PANEL
Anion gap: 11 (ref 5–15)
BUN: 22 mg/dL (ref 8–23)
CO2: 36 mmol/L — ABNORMAL HIGH (ref 22–32)
Calcium: 9.1 mg/dL (ref 8.9–10.3)
Chloride: 84 mmol/L — ABNORMAL LOW (ref 98–111)
Creatinine, Ser: 0.95 mg/dL (ref 0.61–1.24)
GFR, Estimated: >60 mL/min (ref >60)
Glucose, Bld: 118 mg/dL — ABNORMAL HIGH (ref 70–99)
Potassium: 4.6 mmol/L (ref 3.5–5.1)
Sodium: 131 mmol/L — ABNORMAL LOW (ref 135–145)

## 2023-04-12 LAB — FERRITIN: Ferritin: 176 ng/mL (ref 24–336)

## 2023-04-12 LAB — MAGNESIUM: Magnesium: 2.1 mg/dL (ref 1.7–2.4)

## 2023-04-12 LAB — PROTIME-INR
INR: 2.7 — ABNORMAL HIGH (ref 0.8–1.2)
Prothrombin Time: 28.7 s — ABNORMAL HIGH (ref 11.4–15.2)

## 2023-04-12 LAB — PHOSPHORUS: Phosphorus: 4.3 mg/dL (ref 2.5–4.6)

## 2023-04-12 MED ORDER — MIDODRINE HCL 5 MG PO TABS: 2.5000 mg | ORAL_TABLET | Freq: Three times a day (TID) | ORAL | 0 refills | Status: AC

## 2023-04-12 MED ORDER — DEXAMETHASONE 6 MG PO TABS: 6.0000 mg | ORAL_TABLET | Freq: Two times a day (BID) | ORAL | 0 refills | Status: AC

## 2023-04-12 MED ORDER — WARFARIN SODIUM 4 MG PO TABS: 2.0000 mg | ORAL_TABLET | Freq: Every day | ORAL | Status: DC

## 2023-04-12 MED ORDER — LEVOFLOXACIN 500 MG PO TABS: 500.0000 mg | ORAL_TABLET | Freq: Every day | ORAL | 0 refills | Status: AC

## 2023-04-12 NOTE — Progress Notes (Signed)
[facility] LIAISON NOTE   Received request from Darolyn Rua, SW, Transitions of Care Manager, for hospice services at home after discharge. Spoke with Mr. Laessig at the bedside to initiate education related to hospice philosophy, services, and team approach to care. Patient verbalized understanding of information given. Per discussion, the plan is for discharge home by private vehicle today.  His sister will bring portable tanks to the hosiptal.  DME needs discussed.  Patient has the following equipment in the home:  hospital bed, oxygen concentrator and tanks, BSC, wheelchair, nebulizer, BiPap Patient/family requests the following equipment for delivery:  None                 The address has been verified and is correct in the chart. Vinit Gills, spouse is the family contact to arrange time of equipment delivery. [phone]   Please send signed and completed DNR home with patient/family if applicable.   Please provide prescriptions at discharge as needed to ensure ongoing symptom management.  [facility] information and contact numbers given to Mr. Hilaire at bedside. Above information shared with Darolyn Rua, SW, Transitions of Care Manager and hospital medical team.   Please call with any hospice related questions or concerns. Thank you for the opportunity to participate in this patient's care.   Norris Cross, RN Nurse Liaison [phone]

## 2023-04-12 NOTE — Consult Note (Signed)
[facility] Liaison Consult Note    04/12/2023  Tyler [name] 04-13-1956 [id number]  Update: Pt will discharge with [facility] home hospice today. Liaison will collaborate with the [facility] RNCM on pt's discharge disposition.  No further services needed at this time.  [name], RN, BSN Hospital Liaison [name]   [name], Population Health Office Hours MTWF  8:00 am-6:00 pm Direct Dial: [phone] mobile [phone] [Office toll free line] Office Hours are M-F 8:30 - 5 pm [email or web address]

## 2023-04-12 NOTE — Progress Notes (Signed)
OT Cancellation Note  Patient Details Name: Tyler [name] MRN: [id number] DOB: 1955-05-27   Cancelled Treatment:    Reason Eval/Treat Not Completed: Other (comment) Pt declined OT for the day, reporting that he'd like to rest and verbalized that there would be a discussion re: discharge home with Hospice.  Pt verbalized not making a decision yet, but politely declined OT this morning.  Pt declined having any questions this morning related to ADL routines and/or AE.  Pt anticipates d/c home possibly today.  Will check in tomorrow as time allows if pt remains in acute.  Danelle Earthly, MS, OTR/L   [place] Dials 04/12/2023, 9:51 AM

## 2023-04-12 NOTE — TOC Transition Note (Signed)
Transition of Care [facility]) - CM/SW Discharge Note   Patient Details  Name: Tyler [name] MRN: [id number] Date of Birth: 05/30/1955  Transition of Care [facility]) CM/SW Contact:  Darolyn Rua, LCSW Phone Number: 04/12/2023, 11:42 AM   Clinical Narrative:     Patient to discharge home with [facility] home hospice, sister to transport and bring o2 tanks. No further dc needs.  Final next level of care: Home w Hospice Care Barriers to Discharge: No Barriers Identified   Patient Goals and CMS Choice CMS Medicare.gov Compare Post Acute Care list provided to:: Patient Choice offered to / list presented to : Patient  Discharge Placement                         Discharge Plan and Services Additional resources added to the After Visit Summary for                                       Social Determinants of Health (SDOH) Interventions SDOH Screenings   Food Insecurity: No Food Insecurity (04/03/2023)  Housing: Low Risk  (04/03/2023)  Transportation Needs: No Transportation Needs (04/03/2023)  Utilities: Not At Risk (04/03/2023)  Depression (PHQ2-9): Low Risk  (07/31/2021)  Stress: No Stress Concern Present (02/01/2020)  Tobacco Use: Medium Risk (04/02/2023)     Readmission Risk Interventions     No data to display

## 2023-04-12 NOTE — Progress Notes (Signed)
Pulmonary Medicine          Date: 04/12/2023,   MRN# [id number] Tyler [name] [birth year]     AdmissionWeight: 73.5 kg                 CurrentWeight: 73.5 kg       SUBJECTIVE    Patient is sitting up in bed, wife at bedside.  He is on 4L/min .  We will obtain CRP trend and ferritin.  Plan to dc pulmicort, switch steroids to dexamethasone 10 bid.  Starting zithromax 500 for copd.    Patient remains on 4L/min with severe dyspnea even at rest.  Today we discussed brining in his Home medication Otuhaire via nebulizer for use while hospitalized.  Additionally will obtain CXR and diurese interstitial edema due to pan isnspiratory and expiratory wheezing.   04/11/23- patient is silghtly improved, he's now on less dexamethasone. We are tapering steroids. . Ensifenrine via jet nebulizer has been ordered.   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   Atrial fibrillation (HCC)    Cervical spinal stenosis    with neuropathy   CHF (congestive heart failure) (HCC)    Constipation    COPD (chronic obstructive pulmonary disease) (HCC)    COPD with acute exacerbation (HCC) 10/13/2016   Coronary artery disease    Depression    Dyspnea    Dysrhythmia    atrial fibrillation   Emphysema of lung (HCC)    Factor 5 Leiden mutation, heterozygous (HCC)    on coumadin   Factor V Leiden mutation (HCC)    GERD (gastroesophageal reflux disease)    Hypertension    Lung nodule seen on imaging study    being followed by dr. Belia Heman. just watching it for last few years, without change   Mitral valve insufficiency    Moderate tricuspid insufficiency    Myocardial infarction [facility]) 2004   stent placed, pacemaker implanted 2005   Osteomyelitis (HCC)  10/2019   left foot   Oxygen dependent    requires 2L nasal prong oxygen 24 hours a day   Peripheral vascular disease (HCC)    Presence of permanent cardiac pacemaker [phone]   PVD (peripheral vascular disease) (HCC)    Sleep apnea    waiting to have sleep study. Used bipap while hospitalized and said it was great for him.     SURGICAL HISTORY   Past Surgical History:  Procedure Laterality Date   ABOVE KNEE LEG AMPUTATION Right    after below the knee amputation    AMPUTATION Left 11/22/2019   Procedure: AMPUTATION BELOW KNEE;  Surgeon: Annice Needy, MD;  Location: [facility] ORS;  Service: Vascular;  Laterality: Left;   AMPUTATION TOE Left 08/04/2019   Procedure: AMPUTATION TOE MPJ LEFT;  Surgeon: Gwyneth Revels, DPM;  Location: [facility] ORS;  Service: Podiatry;  Laterality: Left;   AMPUTATION TOE Left 10/06/2019   Procedure: AMPUTATION TOE MPJ T1,T2 LEFT;  Surgeon: Gwyneth Revels, DPM;  Location: [facility] ORS;  Service: Podiatry;  Laterality: Left;   APPLICATION OF WOUND VAC Left 01/12/2018   Procedure: APPLICATION OF WOUND VAC;  Surgeon: Annice Needy, MD;  Location: [facility] ORS;  Service: Vascular;  Laterality: Left;   BELOW KNEE LEG  AMPUTATION Right    CATARACT EXTRACTION, BILATERAL Bilateral    COLONOSCOPY N/A 02/19/2020   Procedure: COLONOSCOPY;  Surgeon: Regis Bill, MD;  Location: [facility] ENDOSCOPY;  Service: Endoscopy;  Laterality: N/A;   COLONOSCOPY WITH PROPOFOL N/A 08/27/2020   Procedure: COLONOSCOPY WITH PROPOFOL;  Surgeon: Regis Bill, MD;  Location: [facility] ENDOSCOPY;  Service: Endoscopy;  Laterality: N/A;  REQ MID AM   CORONARY ANGIOPLASTY  2005   stent x 1 placed   ENDARTERECTOMY FEMORAL Left 08/11/2017   Procedure: ENDARTERECTOMY FEMORAL;  Surgeon: Annice Needy, MD;  Location: [facility] ORS;  Service: Vascular;  Laterality: Left;   ESOPHAGOGASTRODUODENOSCOPY N/A 02/19/2020   Procedure: ESOPHAGOGASTRODUODENOSCOPY (EGD);  Surgeon: Regis Bill, MD;  Location: [facility] ENDOSCOPY;   Service: Endoscopy;  Laterality: N/A;   HEMATOMA EVACUATION Left 01/12/2018   Procedure: EVACUATION HEMATOMA ( DRAINING OF SEROMA);  Surgeon: Annice Needy, MD;  Location: [facility] OR[facility];  Service: Vascular;  Laterality: Left;   IMPLANTABLE CARDIOVERTER DEFIBRILLATOR (ICD) GENERATOR CHANGE Left 02/10/2017   Procedure: ICD GENERATOR CHANGE;  Surgeon: Marcina Millard, MD;  Location: [facility] OR[facility];  Service: Cardiovascular;  Laterality: Left;   INSERT / REPLACE / REMOVE PACEMAKER  [id number], 2018   LOWER EXTREMITY ANGIOGRAPHY Left 06/07/2017   Procedure: LOWER EXTREMITY ANGIOGRAPHY;  Surgeon: Annice Needy, MD;  Location: [facility] INVASIVE CV LAB;  Service: Cardiovascular;  Laterality: Left;   LOWER EXTREMITY ANGIOGRAPHY Left 10/05/2019   Procedure: LOWER EXTREMITY ANGIOGRAPHY;  Surgeon: Annice Needy, MD;  Location: [facility] INVASIVE CV LAB;  Service: Cardiovascular;  Laterality: Left;   LOWER EXTREMITY INTERVENTION  06/07/2017   Procedure: LOWER EXTREMITY INTERVENTION;  Surgeon: Annice Needy, MD;  Location: [facility] INVAS[facility] CV LAB;  Service: Cardiovascular;;   PERIPHERAL VASCULAR CATHETERIZATION Left 12/09/2015   Procedure: Lower Extremity Angiography;  Surgeon: Annice Needy, MD;  Location: [facility];  Service: Cardiovascular;  Laterality: Left;   PERIPHERAL VASCULAR CATHETERIZATION  12/09/2015   Procedure: Lower Extremity Intervention;  Surgeon: Annice Needy, MD;  Location: [facility] INVAS[facility] CV LAB;  Service: Cardiovascular;;   TONSILLECTOMY     TRANSMETATARSAL AMPUTATION Left 10/20/2019   Procedure: TRANSMETATARSAL AMPUTATION;  Surgeon: Linus Galas, DPM;  Location: [facility] ORS;  Service: Podiatry;  Laterality: Left;     FAMILY HISTORY   Family History  Problem Relation Age of Onset   Cancer Mother    Cancer Father    Heart disease Father      SOCIAL HISTORY   Social History   Tobacco Use   Smoking status: Former    Current packs/day: 0.00    Average packs/day: 1 pack/day for 42.0 years (42.0 ttl  pk-yrs)    Types: Cigarettes    Start date: 09/24/1971    Quit date: 09/23/2013    Years since quitting: 9.5   Smokeless tobacco: Never  Vaping Use   Vaping status: Never Used  Substance Use Topics   Alcohol use: No    Comment: quit 6 years ago   Drug use: No     MEDICATIONS    Home Medication:    Current Medication:  Current Facility-Administered Medications:    acetylcysteine (MUCOMYST) 20 % nebulizer / oral solution 3 mL, 3 mL, Nebulization, BID, Chipper Herb, Dekui, MD, 3 mL at 04/07/23 2056   ALPRAZolam (XANAX) tablet 1 mg, 1 mg, Oral, BID PRN, Verdene Lennert, MD, 1 mg at 04/11/23 2240   aspirin EC tablet 81 mg, 81 mg, Oral, QPC supper, Verdene Lennert, MD, 81 mg at  04/11/23 1746   citalopram (CELEXA) tablet 20 mg, 20 mg, Oral, Daily, Verdene Lennert, MD, 20 mg at 04/11/23 1610   dexamethasone (DECADRON) injection 6 mg, 6 mg, Intravenous, Q12H, Vida Rigger, MD, 6 mg at 04/11/23 2241   docusate sodium (COLACE) capsule 100 mg, 100 mg, Oral, Daily PRN, Verdene Lennert, MD   Ensifentrine SUSP 1 ampule, 1 ampule, Inhalation, BID, Vida Rigger, MD   gabapentin (NEURONTIN) capsule 300 mg, 300 mg, Oral, TID, Verdene Lennert, MD, 300 mg at 04/11/23 2240   ipratropium-albuterol (DUONEB) 0.5-2.5 (3) MG/3ML nebulizer solution 3 mL, 3 mL, Nebulization, Q6H, Verdene Lennert, MD, 3 mL at 04/12/23 0720   ipratropium-albuterol (DUONEB) 0.5-2.5 (3) MG/3ML nebulizer solution 3 mL, 3 mL, Nebulization, Q4H PRN, Marrion Coy, MD   levofloxacin (LEVAQUIN) tablet 500 mg, 500 mg, Oral, Daily, Karna Christmas, Mija Effertz, MD, 500 mg at 04/11/23 1751   metoprolol succinate (TOPROL-XL) 24 hr tablet 25 mg, 25 mg, Oral, Daily, Renae Gloss, Richard, MD, 25 mg at 04/11/23 0838   midodrine (PROAMATINE) tablet 5 mg, 5 mg, Oral, TID WC, Karna Christmas, Jerris Fleer, MD, 5 mg at 04/11/23 1746   morphine (PF) 2 MG/ML injection 1 mg, 1 mg, Intravenous, Q4H PRN, Alford Highland, MD, 1 mg at 04/11/23 1750   oxyCODONE-acetaminophen (PERCOCET)  7.5-325 MG per tablet 1-2 tablet, 1-2 tablet, Oral, Q6H PRN, Verdene Lennert, MD, 1 tablet at 04/11/23 1749   pantoprazole (PROTONIX) EC tablet 40 mg, 40 mg, Oral, QAC breakfast, Verdene Lennert, MD, 40 mg at 04/11/23 0845   pravastatin (PRAVACHOL) tablet 20 mg, 20 mg, Oral, q1800, Verdene Lennert, MD, 20 mg at 04/11/23 1746   sacubitril-valsartan (ENTRESTO) 24-26 mg per tablet, 1 tablet, Oral, BID, Alford Highland, MD, 1 tablet at 04/11/23 2240   sodium chloride (OCEAN) 0.65 % nasal spray 1 spray, 1 spray, Each Nare, PRN, Marrion Coy, MD   sodium chloride flush (NS) 0.9 % injection 3 mL, 3 mL, Intravenous, Q12H, Verdene Lennert, MD, 3 mL at 04/11/23 2241   tamsulosin (FLOMAX) capsule 0.4 mg, 0.4 mg, Oral, QPC breakfast, Verdene Lennert, MD, 0.4 mg at 04/11/23 9604   torsemide (DEMADEX) tablet 20 mg, 20 mg, Oral, Daily, Vida Rigger, MD, 20 mg at 04/11/23 1752   Warfarin - Pharmacist Dosing Inpatient, , Does not apply, q1600, Orson Aloe, [facility], Given at 04/11/23 1757    ALLERGIES   Apixaban and Rivaroxaban     REVIEW OF SYSTEMS    Review of Systems:  Gen:  Denies  fever, sweats, chills weigh loss  HEENT: Denies blurred vision, double vision, ear pain, eye pain, hearing loss, nose bleeds, sore throat Cardiac:  No dizziness, chest pain or heaviness, chest tightness,edema Resp:   less  cough + shortness of breath,+ mild wheezing, no  hemoptysis,  Gi: Denies swallowing difficulty, stomach pain, nausea or vomiting, diarrhea, constipation, bowel incontinence Gu:  Denies bladder incontinence, burning urine Ext:   Denies Joint pain, stiffness or swelling Skin: Denies  skin rash, easy bruising or bleeding or hives Endoc:  Denies polyuria, polydipsia , polyphagia or weight change Psych:   Denies depression, insomnia or hallucinations   Other:  All other systems negative   VS: BP (!) 112/59 (BP Location: Right Arm)   Pulse 78   Temp 98 F (36.7 C) (Oral)   Resp 18   Ht 6'  1" (1.854 m)   Wt 73.5 kg   SpO2 100%   BMI 21.37 kg/m      PHYSICAL EXAM    GENERAL:NAD, no fevers,  chills, no weakness no fatigue HEAD: Normocephalic, atraumatic.  EYES: Pupils equal, round, reactive to light. Extraocular muscles intact. No scleral icterus.  MOUTH: Moist mucosal membrane. Dentition intact. No abscess noted.  EAR, NOSE, THROAT: Clear without exudates. No external lesions.  NECK: Supple. No thyromegaly. No nodules. No JVD.  PULMONARY: Diffuse coarse rhonchi right sided +wheezes CARDIOVASCULAR: S1 and S2. Regular rate and rhythm. No murmurs, rubs, or gallops. No edema. Pedal pulses 2+ bilaterally.  GASTROINTESTINAL: Soft, nontender, nondistended. No masses. Positive bowel sounds. No hepatosplenomegaly.  MUSCULOSKELETAL: No swelling, clubbing, or edema. Range of motion full in all extremities.  NEUROLOGIC: Cranial nerves II through XII are intact. No gross focal neurological deficits. Sensation intact. Reflexes intact.  SKIN: No ulceration, lesions, rashes, or cyanosis. Skin warm and dry. Turgor intact.  PSYCHIATRIC: Mood, affect within normal limits. The patient is awake, alert and oriented x 3. Insight, judgment intact.       IMAGING    DG Chest Port 1 View  Result Date: 04/06/2023 CLINICAL DATA:  Pulmonary edema. Chronic hypoxia with respiratory failure. EXAM: PORTABLE CHEST 1 VIEW COMPARISON:  04/02/2023 FINDINGS: Chronic cardiomegaly. Pacemaker/AICD remains in place. Aortic atherosclerosis and tortuosity. Chronic lung disease with emphysema and pulmonary scarring. Chronic calcified granuloma in the right upper lobe. No evidence of active infiltrate, collapse or effusion. IMPRESSION: Chronic cardiomegaly. Pacemaker/AICD. Chronic lung disease with emphysema and pulmonary scarring. No active disease. Electronically Signed   By: Paulina Fusi M.D.   On: 04/06/2023 17:11   DG Chest Port 1 View  Result Date: 04/02/2023 CLINICAL DATA:  Shortness of breath. EXAM:  PORTABLE CHEST 1 VIEW COMPARISON:  March 10, 2022. FINDINGS: Stable cardiomediastinal silhouette. Left-sided defibrillator is unchanged in position. Emphysematous disease is noted in the upper lobes. No acute pulmonary disease. Bony thorax is unremarkable. IMPRESSION: No active disease. Emphysema (ICD10-J43.9). Electronically Signed   By: Lupita Raider M.D.   On: 04/02/2023 15:15      ASSESSMENT/PLAN   This is a pleasant gentleman, presents with dyspnea, covid positive, wheezing, and still relatively tight. C/w copd exacerbation. He was on bipap most of the day ( off now). Not as good as last pm  Does not appear to be in failure. 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 He is aware he still at risk of being intubated.    COVID19     Dexamethasone tapering     Pcp ppx    Metaneb TID   Ensifenrine BID     Copd exacerbation, still very tight, but no use of accessory muscles, good color and speaking full sentences -duo neb/ pulmicort, -dexamethasone 8 bid -metaneb -PT/OT if able  End stage COPD very poor prognosis   Sleep apnea -bip[ap at night on present settings   Hx of chf, not in faliure at present. Afib ( rate controlled) -will continue with out patient cardiology recs ( entresto, toprol, asa,warfarin)    Transient hypotension Midodrine 5 tid     Thank you for allowing me to participate in the care of this patient.   Patient/Family are satisfied with care plan and all questions have been answered.  This document was prepared using Dragon voice recognition software and may include unintentional dictation errors.    Vida Rigger, M.D.  Pulmonary & Critical Care Medicine  [facility]

## 2023-04-12 NOTE — Plan of Care (Signed)
  Problem: Education: Goal: Knowledge of risk factors and measures for prevention of condition will improve Outcome: Progressing   

## 2023-04-12 NOTE — Discharge Summary (Signed)
Physician Discharge Summary   Patient: Tyler [name] MRN: [id number] DOB: Aug 03, 1955  Admit date:     04/02/2023  Discharge date: 04/12/23  Discharge Physician: Marrion Coy   PCP: Gracelyn Nurse, MD   Recommendations at discharge:   Follow-up with hospice as outpatient. Follow-up with PCP in 1 week. Follow-up with pulmonology in 2 weeks. Follow-up INR with PCP.  Discharge Diagnoses: Principal Problem:   Acute on chronic hypoxic respiratory failure (HCC) Active Problems:   COVID-19 virus infection   COPD with acute exacerbation (HCC)   Chronic systolic CHF (congestive heart failure), NYHA class 3 (HCC)   AF (paroxysmal atrial fibrillation) (HCC)   Coronary artery disease   Sleep apnea   Essential hypertension, benign   BPH (benign prostatic hyperplasia) Hyponatremia. Resolved Problems:   * No resolved hospital problems. *  Hospital Course: 67 year old man with past medical history of chronic respiratory failure on 3 L oxygen, COPD, bronchiectasis, atrial fibrillation on Coumadin, sleep apnea on BiPAP at night, heart failure with reduced ejection fraction, AICD, CAD, hypertension, factor V Leiden mutation, hyperlipidemia, bilateral lower extremity amputations.  He presents to the hospital with respiratory distress coughing up green sputum he was found to be COVID-positive.  11/9.  Patient was tried off the BiPAP a couple times and asked to go back on the BiPAP.   11/10.  Patient received baricitinib and increased dose of Solu-Medrol to 80 mg daily. 11/12.  Patient with more trouble breathing today than yesterday.  Blood pressure better today can give usual Toprol and Lasix dose.  Restart Entresto this evening. 11/13.  Off of BiPAP during daytime.  Still have a large amount of mucus production. Has been seen by pulmonology, condition has been improving, but he has end-stage COPD, long-term prognosis is very poor.  He wished to stay over the weekend to think about what he  wanted do.  Eventually, he accepted hospice.  He will be discharged home with hospice referral.  Assessment and Plan: Acute on chronic hypoxic respiratory failure (HCC) COVID-19 virus infection COPD with acute exacerbation [facility]) Patient admitted with respiratory distress and placed on BiPAP.  Patient was on 3 L oxygen at daytime at home, trilogy at night due to chronic respiratory failure. Worsening oxygenation mainly due to COPD exacerbation.  I personally reviewed patient chest x-ray image, no significant infiltration. No evidence of bacterial pneumonia or volume overload. Appreciate pulmonology consult.  Currently patient is on IV steroids.  He still has large amount of thick mucus, on Zithromax.  Will continue scheduled bronchodilator, also added Mucomyst. Discussed with pulmonology, patient to COPD appear to be terminal.  Recommended hospice care.  Patient would consider that.  Most likely patient can be discharged home with hospice care.  However, we will continue treating him during the weekend, will make a decision on Monday.   Patient continues to make progress, still coughing up a large amount of mucus.  Short of breath seem to be better.  However, patient still requiring IV morphine for air hunger.  And was restarted on Levaquin 2 days ago by pulmonology for increased mucus production, will continue 7 days.  Reduced warfarin dose to 2 mg due to interaction.  Follow-up with PCP to check INR.   Condition stable today, plan to discharge home today with hospice referral.     Chronic systolic CHF (congestive heart failure), NYHA class 3 (HCC) Patient has a history of HFrEF with a EF as low as 30% with improvement to 35-40% on most  recent echo.  No evidence of volume overload.  Restarted Entresto.  Also restarted all home medicines.   AF (paroxysmal atrial fibrillation) (HCC) Due to interaction of warfarin with Levaquin, warfarin dosage decreased to half.  Heart rate controlled with  beta-blocker.   Coronary artery disease Continue Toprol and aspirin.   Obstructive sleep apnea BiPAP at bedtime to be continued.   Essential hypertension, benign Continue Toprol and Lasix.   BPH (benign prostatic hyperplasia) On Flomax  Orthostatic hypotension. Patient was started on midodrine, blood pressure seem to be better, reduce dose to 2.5 mg 3 times a day.         Consultants: Pulmonology. Procedures performed: None  Disposition: Hospice care Diet recommendation:  Discharge Diet Orders (From admission, onward)     Start     Ordered   04/12/23 0000  Diet - low sodium heart healthy        04/12/23 0951           Cardiac diet DISCHARGE MEDICATION: Allergies as of 04/12/2023       Reactions   Apixaban Rash   Rivaroxaban Rash        Medication List     STOP taking these medications    Dupixent 300 MG/2ML Soaj Generic drug: Dupilumab   fluticasone-salmeterol 115-21 MCG/ACT inhaler Commonly known as: Advair HFA   ipratropium 0.03 % nasal spray Commonly known as: ATROVENT   sildenafil 25 MG tablet Commonly known as: VIAGRA       TAKE these medications    acetaminophen 500 MG tablet Commonly known as: TYLENOL Take 1,000 mg by mouth daily as needed for moderate pain or headache.   ALPRAZolam 1 MG tablet Commonly known as: XANAX Take 1 mg by mouth 2 (two) times daily as needed for anxiety or sleep.   aspirin EC 81 MG tablet Take 1 tablet (81 mg total) by mouth daily. What changed: when to take this   budesonide 0.25 MG/2ML nebulizer solution Commonly known as: PULMICORT Take 2 mLs by nebulization 2 (two) times daily. What changed: Another medication with the same name was removed. Continue taking this medication, and follow the directions you see here.   citalopram 20 MG tablet Commonly known as: CELEXA Take 20 mg by mouth daily.   cyanocobalamin 1000 MCG tablet Commonly known as: VITAMIN B12 Take 1,000 mcg by mouth daily.    dexamethasone 6 MG tablet Commonly known as: DECADRON Take 1 tablet (6 mg total) by mouth 2 (two) times daily with a meal for 1 day. Start taking on: April 13, 2023 What changed:  medication strength how much to take when to take this   docusate sodium 100 MG capsule Commonly known as: COLACE Take 100 mg by mouth daily as needed (for constipation.).   Entresto 24-26 MG Generic drug: sacubitril-valsartan Take 1 tablet by mouth 2 (two) times daily.   EPINEPHrine 0.3 mg/0.3 mL Soaj injection Commonly known as: EPI-PEN Inject into the muscle.   Flutter Devi Use as directed.   furosemide 20 MG tablet Commonly known as: LASIX Take 1 tablet (20 mg total) by mouth daily.   gabapentin 100 MG capsule Commonly known as: NEURONTIN Take 300 mg by mouth in the morning, at noon, and at bedtime.   guaiFENesin 600 MG 12 hr tablet Commonly known as: MUCINEX Take 1 tablet (600 mg total) by mouth every evening.   ipratropium-albuterol 0.5-2.5 (3) MG/3ML Soln Commonly known as: DUONEB Take 3 mLs by nebulization 4 (four) times daily. What changed:  Another medication with the same name was removed. Continue taking this medication, and follow the directions you see here.   Jardiance 10 MG Tabs tablet Generic drug: empagliflozin Take 10 mg by mouth daily with breakfast.   levofloxacin 500 MG tablet Commonly known as: LEVAQUIN Take 1 tablet (500 mg total) by mouth daily for 4 days. Start taking on: April 13, 2023   lovastatin 20 MG tablet Commonly known as: MEVACOR Take 1 tablet by mouth daily.   metoprolol succinate 25 MG 24 hr tablet Commonly known as: TOPROL-XL Take 25 mg by mouth daily.   midodrine 5 MG tablet Commonly known as: PROAMATINE Take 0.5 tablets (2.5 mg total) by mouth 3 (three) times daily with meals.   morphine 15 MG 12 hr tablet Commonly known as: MS CONTIN Take 15 mg by mouth daily as needed.   naloxone 4 MG/0.1ML Liqd nasal spray kit Commonly known  as: NARCAN SMARTSIG:Both Nares   nitroGLYCERIN 0.4 MG SL tablet Commonly known as: NITROSTAT Place 0.4 mg under the tongue every 5 (five) minutes as needed for chest pain.   oxyCODONE-acetaminophen 7.5-325 MG tablet Commonly known as: PERCOCET Take 1-2 tablets by mouth every 6 (six) hours as needed for moderate pain.   pantoprazole 40 MG tablet Commonly known as: PROTONIX Take 40 mg by mouth daily before breakfast.   tamsulosin 0.4 MG Caps capsule Commonly known as: FLOMAX Take 0.4 mg by mouth daily.   Trelegy Ellipta 100-62.5-25 MCG/ACT Aepb Generic drug: Fluticasone-Umeclidin-Vilant Inhale 1 puff into the lungs daily.   warfarin 4 MG tablet Commonly known as: COUMADIN Take 0.5 tablets (2 mg total) by mouth daily. What changed: how much to take        Follow-up Information     Gracelyn Nurse, MD Follow up in 1 week(s).   Specialty: Internal Medicine Contact information: [address] [facility] [place] [address] [phone]         Vida Rigger, MD Follow up in 2 week(s).   Specialty: Pulmonary Disease Contact information: [address] [place] [address] [phone]                Discharge Exam: Ceasar Mons Weights   04/02/23 1219  Weight: 73.5 kg   General exam: Appears calm and comfortable  Respiratory system: Decreased breathing sounds with minimal air movement.Marland Kitchen Respiratory effort normal. Cardiovascular system: S1 & S2 heard, RRR. No JVD, murmurs, rubs, gallops or clicks. No pedal edema. Gastrointestinal system: Abdomen is nondistended, soft and nontender. No organomegaly or masses felt. Normal bowel sounds heard. Central nervous system: Alert and oriented. No focal neurological deficits. Extremities: Symmetric 5 x 5 power. Skin: No rashes, lesions or ulcers Psychiatry: Judgement and insight appear normal. Mood & affect appropriate.    Condition at discharge: poor  The results of significant diagnostics from  this hospitalization (including imaging, microbiology, ancillary and laboratory) are listed below for reference.   Imaging Studies: DG Chest Port 1 View  Result Date: 04/06/2023 CLINICAL DATA:  Pulmonary edema. Chronic hypoxia with respiratory failure. EXAM: PORTABLE CHEST 1 VIEW COMPARISON:  04/02/2023 FINDINGS: Chronic cardiomegaly. Pacemaker/AICD remains in place. Aortic atherosclerosis and tortuosity. Chronic lung disease with emphysema and pulmonary scarring. Chronic calcified granuloma in the right upper lobe. No evidence of active infiltrate, collapse or effusion. IMPRESSION: Chronic cardiomegaly. Pacemaker/AICD. Chronic lung disease with emphysema and pulmonary scarring. No active disease. Electronically Signed   By: Paulina Fusi M.D.   On: 04/06/2023 17:11   DG Chest [facility]  1 View  Result Date: 04/02/2023 CLINICAL DATA:  Shortness of breath. EXAM: PORTABLE CHEST 1 VIEW COMPARISON:  March 10, 2022. FINDINGS: Stable cardiomediastinal silhouette. Left-sided defibrillator is unchanged in position. Emphysematous disease is noted in the upper lobes. No acute pulmonary disease. Bony thorax is unremarkable. IMPRESSION: No active disease. Emphysema (ICD10-J43.9). Electronically Signed   By: Lupita Raider M.D.   On: 04/02/2023 15:15    Microbiology: Results for orders placed or performed during the hospital encounter of 04/02/23  Blood Culture (routine x 2)     Status: None   Collection Time: 04/02/23 12:43 PM   Specimen: BLOOD  Result Value Ref Range Status   Specimen Description BLOOD RIGHT ANTECUBITAL  Final   Special Requests   Final    BOTTLES DRAWN AEROBIC AND ANAEROBIC Blood Culture adequate volume   Culture   Final    NO GROWTH 5 DAYS Performed at [facility], [address] [place], [place] [address]    Report Status 04/07/2023 FINAL  Final  Blood Culture (routine x 2)     Status: None   Collection Time: 04/02/23 12:43 PM   Specimen: BLOOD  Result Value Ref Range  Status   Specimen Description BLOOD BLOOD LEFT FOREARM  Final   Special Requests   Final    BOTTLES DRAWN AEROBIC AND ANAEROBIC Blood Culture adequate volume   Culture   Final    NO GROWTH 5 DAYS Performed at [facility], [address], [place], [place] [address]    Report Status 04/07/2023 FINAL  Final  Resp Panel by RT-PCR (Flu A&B, Covid) Anterior Nasal Swab     Status: Abnormal   Collection Time: 04/02/23 12:43 PM   Specimen: Anterior Nasal Swab  Result Value Ref Range Status   SARS Coronavirus 2 by RT PCR POSITIVE (A) NEGATIVE Final    Comment: (NOTE) SARS-CoV-2 target nucleic acids are DETECTED.  The SARS-CoV-2 RNA is generally detectable in upper respiratory specimens during the acute phase of infection. Positive results are indicative of the presence of the identified virus, but do not rule out bacterial infection or co-infection with other pathogens not detected by the test. Clinical correlation with patient history and other diagnostic information is necessary to determine patient infection status. The expected result is Negative.  Fact Sheet for Patients: BloggerCourse.com  Fact Sheet for Healthcare Providers: SeriousBroker.it  This test is not yet approved or cleared by the [place] FDA and  has been authorized for detection and/or diagnosis of SARS-CoV-2 by FDA under an Emergency Use Authorization (EUA).  This EUA will remain in effect (meaning this test can be used) for the duration of  the COVID-19 declaration under Section 564(b)(1) of the A ct, 21 U.S.C. section 360bbb-3(b)(1), unless the authorization is terminated or revoked sooner.     Influenza A by PCR NEGATIVE NEGATIVE Final   Influenza B by PCR NEGATIVE NEGATIVE Final    Comment: (NOTE) The Xpert Xpress SARS-CoV-2/FLU/RSV plus assay is intended as an aid in the diagnosis of influenza from Nasopharyngeal swab specimens and should not  be used as a sole basis for treatment. Nasal washings and aspirates are unacceptable for Xpert Xpress SARS-CoV-2/FLU/RSV testing.  Fact Sheet for Patients: BloggerCourse.com  Fact Sheet for Healthcare Providers: SeriousBroker.it  This test is not yet approved or cleared by the [place] FDA and has been authorized for detection and/or diagnosis of SARS-CoV-2 by FDA under an Emergency Use Authorization (EUA). This EUA will remain in effect (meaning  this test can be used) for the duration of the COVID-19 declaration under Section 564(b)(1) of the Act, 21 U.S.C. section 360bbb-3(b)(1), unless the authorization is terminated or revoked.  Performed at [facility], [address] [place], [place] [address]     Labs: CBC: Recent Labs  Lab 04/07/23 0323 04/12/23 0536  WBC 10.2 11.7*  HGB 12.0* 12.1*  HCT 36.3* 35.4*  MCV 92.8 91.9  PLT 242 302   Basic Metabolic Panel: Recent Labs  Lab 04/07/23 0323 04/12/23 0536  NA 135 131*  K 4.8 4.6  CL 91* 84*  CO2 34* 36*  GLUCOSE 107* 118*  BUN 21 22  CREATININE 0.66 0.95  CALCIUM 9.3 9.1  MG  --  2.1  PHOS  --  4.3   Liver Function Tests: No results for input(s): "AST", "ALT", "ALKPHOS", "BILITOT", "PROT", "ALBUMIN" in the last 168 hours. CBG: No results for input(s): "GLUCAP" in the last 168 hours.  Discharge time spent: greater than 30 minutes.  Signed: Marrion Coy, MD [facility] 04/12/2023

## 2023-04-12 NOTE — Care Management Important Message (Signed)
Important Message  Patient Details  Name: Tyler [name] MRN: [id number] Date of Birth: July 01, 1955   Important Message Given:  Yes - Medicare IM  I reviewed the Important Message from Medicare with the patient by phone again since he is in an isolation room. Stated he was in agreement with his discharge and I thanked him for his time.   Tyler [name] 04/12/2023, 11:04 AM

## 2023-04-12 NOTE — Consult Note (Addendum)
Pharmacy Consult Note - Anticoagulation  Pharmacy Consult for warfarin Indication: atrial fibrillation  PATIENT MEASUREMENTS: Height: 6\' 1"  (185.4 cm) Weight: 73.5 kg (162 lb) IBW/kg (Calculated) : 79.9 HEPARIN DW (KG): 73.5  VITAL SIGNS: Temp: 98 F (36.7 C) (11/18 0756) Temp Source: Oral (11/18 0756) BP: 112/59 (11/18 0756) Pulse Rate: 78 (11/18 0756)  Recent Labs    04/12/23 0536  HGB 12.1*  HCT 35.4*  PLT 302  LABPROT 28.7*  INR 2.7*  CREATININE 0.95    Estimated Creatinine Clearance: 78.4 mL/min (by C-G formula based on SCr of 0.95 mg/dL).  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   Atrial fibrillation (HCC)    Cervical spinal stenosis    with neuropathy   CHF (congestive heart failure) (HCC)    Constipation    COPD (chronic obstructive pulmonary disease) (HCC)    COPD with acute exacerbation (HCC) 10/13/2016   Coronary artery disease    Depression    Dyspnea    Dysrhythmia    atrial fibrillation   Emphysema of lung (HCC)    Factor 5 Leiden mutation, heterozygous (HCC)    on coumadin   Factor V Leiden mutation (HCC)    GERD (gastroesophageal reflux disease)    Hypertension    Lung nodule seen on imaging study    being followed by dr. Belia Heman. just watching it for last few years, without change   Mitral valve insufficiency    Moderate tricuspid insufficiency    Myocardial infarction [facility]) 2004   stent placed, pacemaker implanted 2005   Osteomyelitis (HCC) 10/2019   left foot   Oxygen dependent    requires 2L nasal prong oxygen 24 hours a day   Peripheral vascular disease (HCC)    Presence of permanent cardiac pacemaker [phone]   PVD (peripheral vascular disease) (HCC)    Sleep apnea    waiting to have  sleep study. Used bipap while hospitalized and said it was great for him.   Baseline anticoagulation labs: Recent Labs    04/10/23 0541 04/11/23 0408 04/12/23 0536  INR 3.1* 3.0* 2.7*  HGB  --   --  12.1*  PLT  --   --  302   ASSESSMENT: 67 y.o. male with PMH Afib, factor V Leiden mutation, COPD is presenting with chest pain and cough. Patient is on chronic anticoagulation with warfarin for Afib. HR currently controlled and CHADSVASc is at least 4 (age, CHF, HTN, CAD). Pharmacy has been consulted to initiate and manage heparin intravenous infusion.  Warfarin PTA dose 4 mg daily Total weekly warfarin: 28 mg Last dose on 11/7 TTR : unknown  Interacting medications:  Decadron (steroids)  Nov/13: Patient remains within therapeutic range and trending downwards with stable Hgn and PLT since admission. DDI with azithromycin and steroid (solumedrol > decadron) now stable on day 5. Patient has history of factor V Leiden mutation that puts them at risk for clotting in addition to A.Fib (CHADS-Vasc4) and will try to avoid SUB-therapeutic INR to avoid need to bridge with LWMH/heparin.  Last dose of azithromycin 11/15  Goal(s) of therapy: INR 2 - 3 Monitor platelets by anticoagulation protocol: Yes   DATE INR WARFARIN DOSE COMMENTS  11/8 2.4 4mg    11/9 2.6 4mg    11/10 3.9  HELD SUPRA-therapeutic  11/11 3.2 2 mg    11/12 2.6 2 mg Therapeutic   11/13 2.2 4 mg Therapeutic  11/14 2.5 4 mg Therapeutic  11/15 2.7 4 mg therapeutic  11/16 3.1 2 mg Slightly supratherapeutic  11/17 3.0 2.5 mg therapeutic  11/18 2.7     PLAN:  Warfarin 3 mg x 1 today at 1600. Patient off zithromax x 2 days  Average last 6 doses ~3 mg. Will give 3 mg today and will monitor when can reliably resume PTA dose (4 mg daily) post antibiotics. Steroids can still cause increases in INR from baseline Continue daily INR  Check CBC at least every 7 days for inpatients on warfarin (last CBC 11/13) - check next CBC  11/20.  Effie Shy, PharmD Pharmacy Resident  04/12/2023 9:42 AM

## 2023-04-12 NOTE — Plan of Care (Signed)

## 2023-04-13 ENCOUNTER — Telehealth: Payer: Self-pay | Admitting: *Deleted

## 2023-04-13 NOTE — Patient Outreach (Signed)
  Care Coordination   Follow Up Visit Note   04/13/2023 Name: Tyler [name] MRN: [id number] DOB: 05/23/1956  Tyler [name] is a 67 y.o. year old male who sees Gracelyn Nurse, MD for primary care. I spoke with  Arnetha Courser by phone today.  What matters to the patients health and wellness today?  Patient recently admitted to hospital, discharged home with hospice.  Confirms initial visit will be today at 2pm.  Denies any urgent concerns, encouraged to contact this care manager with questions.      Goals Addressed             This Visit's Progress    COMPLETED: Effective management of COPD   On track    Care Coordination Interventions: Provided patient with basic written and verbal COPD education on self care/management/and exacerbation prevention Advised patient to track and manage COPD triggers Provided instruction about proper use of medications used for management of COPD including inhalers Advised patient to self assesses COPD action plan zone and make appointment with provider if in the yellow zone for 48 hours without improvement          SDOH assessments and interventions completed:  No     Care Coordination Interventions:  Yes, provided   Interventions Today    Flowsheet Row Most Recent Value  Chronic Disease   Chronic disease during today's visit Chronic Obstructive Pulmonary Disease (COPD)  General Interventions   General Interventions Discussed/Reviewed General Interventions Reviewed  Education Interventions   Education Provided Provided Education  Provided Verbal Education On When to see the doctor  Advanced Directive Interventions   Advanced Directives Discussed/Reviewed Advanced Directives Reviewed, End of Life  End of Life Hospice  [Patient now active with hospice through [facility]]       Follow up plan: No further intervention required.   Encounter Outcome:  Patient Visit Completed   Rodney Langton, RN, MSN, CCM [place]   [facility] RN Care Coordinator Direct Dial: [phone] / Main [phone] Fax [phone] Email: [email or web address]: [email or web address]

## 2023-04-16 DIAGNOSIS — J449 Chronic obstructive pulmonary disease, unspecified: Secondary | ICD-10-CM | POA: Diagnosis not present

## 2023-04-16 DIAGNOSIS — Z7901 Long term (current) use of anticoagulants: Secondary | ICD-10-CM | POA: Diagnosis not present

## 2023-04-16 DIAGNOSIS — Z9981 Dependence on supplemental oxygen: Secondary | ICD-10-CM | POA: Diagnosis not present

## 2023-04-16 DIAGNOSIS — J439 Emphysema, unspecified: Secondary | ICD-10-CM | POA: Diagnosis not present

## 2023-04-16 DIAGNOSIS — F419 Anxiety disorder, unspecified: Secondary | ICD-10-CM | POA: Diagnosis not present

## 2023-04-16 DIAGNOSIS — I5020 Unspecified systolic (congestive) heart failure: Secondary | ICD-10-CM | POA: Diagnosis not present

## 2023-04-16 DIAGNOSIS — F32A Depression, unspecified: Secondary | ICD-10-CM | POA: Diagnosis not present

## 2023-04-16 DIAGNOSIS — J9611 Chronic respiratory failure with hypoxia: Secondary | ICD-10-CM | POA: Diagnosis not present

## 2023-04-16 DIAGNOSIS — I4821 Permanent atrial fibrillation: Secondary | ICD-10-CM | POA: Diagnosis not present

## 2023-04-20 DIAGNOSIS — Z7901 Long term (current) use of anticoagulants: Secondary | ICD-10-CM | POA: Diagnosis not present

## 2023-04-27 DIAGNOSIS — J431 Panlobular emphysema: Secondary | ICD-10-CM | POA: Diagnosis not present

## 2023-04-27 DIAGNOSIS — J9611 Chronic respiratory failure with hypoxia: Secondary | ICD-10-CM | POA: Diagnosis not present

## 2023-04-27 DIAGNOSIS — I482 Chronic atrial fibrillation, unspecified: Secondary | ICD-10-CM | POA: Diagnosis not present

## 2023-05-06 DIAGNOSIS — J449 Chronic obstructive pulmonary disease, unspecified: Secondary | ICD-10-CM | POA: Diagnosis not present

## 2023-05-17 DIAGNOSIS — I4821 Permanent atrial fibrillation: Secondary | ICD-10-CM | POA: Diagnosis not present

## 2023-05-17 DIAGNOSIS — Z7901 Long term (current) use of anticoagulants: Secondary | ICD-10-CM | POA: Diagnosis not present

## 2023-05-25 DIAGNOSIS — I482 Chronic atrial fibrillation, unspecified: Secondary | ICD-10-CM | POA: Diagnosis not present

## 2023-05-25 DIAGNOSIS — Z7901 Long term (current) use of anticoagulants: Secondary | ICD-10-CM | POA: Diagnosis not present

## 2023-05-31 DIAGNOSIS — Z7901 Long term (current) use of anticoagulants: Secondary | ICD-10-CM | POA: Diagnosis not present

## 2023-06-14 DIAGNOSIS — Z7901 Long term (current) use of anticoagulants: Secondary | ICD-10-CM | POA: Diagnosis not present

## 2023-06-14 DIAGNOSIS — I4821 Permanent atrial fibrillation: Secondary | ICD-10-CM | POA: Diagnosis not present

## 2023-06-22 DIAGNOSIS — Z7901 Long term (current) use of anticoagulants: Secondary | ICD-10-CM | POA: Diagnosis not present

## 2023-06-28 DIAGNOSIS — Z7901 Long term (current) use of anticoagulants: Secondary | ICD-10-CM | POA: Diagnosis not present

## 2023-07-06 DIAGNOSIS — Z7901 Long term (current) use of anticoagulants: Secondary | ICD-10-CM | POA: Diagnosis not present

## 2023-07-12 DIAGNOSIS — I4821 Permanent atrial fibrillation: Secondary | ICD-10-CM | POA: Diagnosis not present

## 2023-07-12 DIAGNOSIS — Z7901 Long term (current) use of anticoagulants: Secondary | ICD-10-CM | POA: Diagnosis not present

## 2023-07-20 DIAGNOSIS — Z7901 Long term (current) use of anticoagulants: Secondary | ICD-10-CM | POA: Diagnosis not present

## 2023-08-09 DIAGNOSIS — I4821 Permanent atrial fibrillation: Secondary | ICD-10-CM | POA: Diagnosis not present

## 2023-08-09 DIAGNOSIS — Z7901 Long term (current) use of anticoagulants: Secondary | ICD-10-CM | POA: Diagnosis not present

## 2023-08-17 DIAGNOSIS — Z7901 Long term (current) use of anticoagulants: Secondary | ICD-10-CM | POA: Diagnosis not present

## 2023-09-07 DIAGNOSIS — I4821 Permanent atrial fibrillation: Secondary | ICD-10-CM | POA: Diagnosis not present

## 2023-09-07 DIAGNOSIS — Z7901 Long term (current) use of anticoagulants: Secondary | ICD-10-CM | POA: Diagnosis not present

## 2023-09-14 DIAGNOSIS — Z7901 Long term (current) use of anticoagulants: Secondary | ICD-10-CM | POA: Diagnosis not present

## 2023-09-20 DIAGNOSIS — I482 Chronic atrial fibrillation, unspecified: Secondary | ICD-10-CM | POA: Diagnosis not present

## 2023-09-20 DIAGNOSIS — Z7901 Long term (current) use of anticoagulants: Secondary | ICD-10-CM | POA: Diagnosis not present

## 2023-10-04 DIAGNOSIS — I4821 Permanent atrial fibrillation: Secondary | ICD-10-CM | POA: Diagnosis not present

## 2023-10-04 DIAGNOSIS — Z7901 Long term (current) use of anticoagulants: Secondary | ICD-10-CM | POA: Diagnosis not present

## 2023-10-05 DIAGNOSIS — I5022 Chronic systolic (congestive) heart failure: Secondary | ICD-10-CM | POA: Diagnosis not present

## 2023-10-24 DEATH — deceased
